# Patient Record
Sex: Male | Born: 1955
Health system: Southern US, Community
[De-identification: ages and names within clinical notes are randomized; demographics above are authoritative.]

## PROBLEM LIST (undated history)

## (undated) DIAGNOSIS — I10 Essential (primary) hypertension: Secondary | ICD-10-CM

## (undated) DIAGNOSIS — D649 Anemia, unspecified: Secondary | ICD-10-CM

## (undated) DIAGNOSIS — Z87442 Personal history of urinary calculi: Secondary | ICD-10-CM

## (undated) DIAGNOSIS — R011 Cardiac murmur, unspecified: Secondary | ICD-10-CM

## (undated) DIAGNOSIS — E785 Hyperlipidemia, unspecified: Secondary | ICD-10-CM

## (undated) DIAGNOSIS — I739 Peripheral vascular disease, unspecified: Secondary | ICD-10-CM

## (undated) DIAGNOSIS — M25562 Pain in left knee: Secondary | ICD-10-CM

## (undated) DIAGNOSIS — K219 Gastro-esophageal reflux disease without esophagitis: Secondary | ICD-10-CM

## (undated) DIAGNOSIS — G4730 Sleep apnea, unspecified: Secondary | ICD-10-CM

## (undated) DIAGNOSIS — M1990 Unspecified osteoarthritis, unspecified site: Secondary | ICD-10-CM

## (undated) DIAGNOSIS — I4891 Unspecified atrial fibrillation: Secondary | ICD-10-CM

## (undated) DIAGNOSIS — E119 Type 2 diabetes mellitus without complications: Secondary | ICD-10-CM

## (undated) DIAGNOSIS — R519 Headache, unspecified: Secondary | ICD-10-CM

## (undated) DIAGNOSIS — I350 Nonrheumatic aortic (valve) stenosis: Secondary | ICD-10-CM

## (undated) DIAGNOSIS — R0600 Dyspnea, unspecified: Secondary | ICD-10-CM

## (undated) DIAGNOSIS — S83209A Unspecified tear of unspecified meniscus, current injury, unspecified knee, initial encounter: Secondary | ICD-10-CM

## (undated) DIAGNOSIS — I2510 Atherosclerotic heart disease of native coronary artery without angina pectoris: Secondary | ICD-10-CM

## (undated) DIAGNOSIS — C801 Malignant (primary) neoplasm, unspecified: Secondary | ICD-10-CM

## (undated) HISTORY — DX: Unspecified atrial fibrillation: I48.91

## (undated) HISTORY — PX: TONSILLECTOMY: SUR1361

## (undated) HISTORY — PX: KNEE ARTHROSCOPY W/ MENISCECTOMY: SHX1879

## (undated) HISTORY — DX: Nonrheumatic aortic (valve) stenosis: I35.0

## (undated) HISTORY — PX: CATARACT EXTRACTION W/ INTRAOCULAR LENS  IMPLANT, BILATERAL: SHX1307

---

## 1980-01-20 HISTORY — PX: NASAL SINUS SURGERY: SHX719

## 1983-01-20 HISTORY — PX: CERVICAL FUSION: SHX112

## 1989-01-19 HISTORY — PX: KNEE ARTHROSCOPY W/ MENISCECTOMY: SHX1879

## 1996-01-20 HISTORY — PX: APPENDECTOMY: SHX54

## 2002-04-11 ENCOUNTER — Encounter: Admission: RE | Admit: 2002-04-11 | Discharge: 2002-07-10 | Payer: Self-pay | Admitting: *Deleted

## 2004-01-04 ENCOUNTER — Encounter: Admission: RE | Admit: 2004-01-04 | Discharge: 2004-01-04 | Payer: Self-pay | Admitting: Family Medicine

## 2005-07-10 ENCOUNTER — Encounter: Admission: RE | Admit: 2005-07-10 | Discharge: 2005-07-10 | Payer: Self-pay | Admitting: Otolaryngology

## 2005-09-04 ENCOUNTER — Ambulatory Visit (HOSPITAL_BASED_OUTPATIENT_CLINIC_OR_DEPARTMENT_OTHER): Admission: RE | Admit: 2005-09-04 | Discharge: 2005-09-04 | Payer: Self-pay | Admitting: Orthopedic Surgery

## 2005-09-04 HISTORY — PX: ROTATOR CUFF REPAIR: SHX139

## 2006-04-28 ENCOUNTER — Encounter: Admission: RE | Admit: 2006-04-28 | Discharge: 2006-04-28 | Payer: Self-pay | Admitting: Family Medicine

## 2010-02-09 ENCOUNTER — Encounter: Payer: Self-pay | Admitting: Otolaryngology

## 2010-02-09 ENCOUNTER — Encounter: Payer: Self-pay | Admitting: Cardiology
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
## 2010-06-20 ENCOUNTER — Ambulatory Visit
Admission: RE | Admit: 2010-06-20 | Discharge: 2010-06-20 | Disposition: A | Discharge status: Home / Self Care | Payer: 59 | Source: Ambulatory Visit | Attending: Family Medicine | Admitting: Family Medicine

## 2010-06-20 ENCOUNTER — Other Ambulatory Visit: Payer: Self-pay | Admitting: Family Medicine

## 2010-06-20 DIAGNOSIS — R109 Unspecified abdominal pain: Secondary | ICD-10-CM

## 2011-10-14 ENCOUNTER — Encounter (HOSPITAL_BASED_OUTPATIENT_CLINIC_OR_DEPARTMENT_OTHER): Payer: Self-pay | Admitting: *Deleted

## 2011-10-15 ENCOUNTER — Encounter (HOSPITAL_BASED_OUTPATIENT_CLINIC_OR_DEPARTMENT_OTHER): Payer: Self-pay | Admitting: *Deleted

## 2011-10-15 NOTE — Progress Notes (Signed)
NPO AFTER MN WITH EXCEPTION WATER GATORADE UNTIL 0700. ARRIVES AT 1100. NEEDS ISTAT AND EKG. WILL TAKE FLEXERIL AND SERZONE AM OF SURG W/ SIP OF WATER.

## 2011-10-19 ENCOUNTER — Encounter (HOSPITAL_BASED_OUTPATIENT_CLINIC_OR_DEPARTMENT_OTHER)
Admission: RE | Disposition: A | Discharge status: Home / Self Care | Payer: Self-pay | Source: Ambulatory Visit | Attending: Specialist

## 2011-10-19 ENCOUNTER — Other Ambulatory Visit: Payer: Self-pay

## 2011-10-19 ENCOUNTER — Encounter (HOSPITAL_BASED_OUTPATIENT_CLINIC_OR_DEPARTMENT_OTHER): Payer: Self-pay | Admitting: *Deleted

## 2011-10-19 ENCOUNTER — Other Ambulatory Visit: Payer: Self-pay | Admitting: Specialist

## 2011-10-19 ENCOUNTER — Ambulatory Visit (HOSPITAL_BASED_OUTPATIENT_CLINIC_OR_DEPARTMENT_OTHER)
Admission: RE | Admit: 2011-10-19 | Discharge: 2011-10-19 | Disposition: A | Discharge status: Home / Self Care | Payer: Worker's Compensation | Source: Ambulatory Visit | Attending: Specialist | Admitting: Specialist

## 2011-10-19 ENCOUNTER — Ambulatory Visit (HOSPITAL_BASED_OUTPATIENT_CLINIC_OR_DEPARTMENT_OTHER): Payer: Worker's Compensation | Admitting: Anesthesiology

## 2011-10-19 ENCOUNTER — Encounter (HOSPITAL_BASED_OUTPATIENT_CLINIC_OR_DEPARTMENT_OTHER): Payer: Self-pay | Admitting: Anesthesiology

## 2011-10-19 DIAGNOSIS — S83249A Other tear of medial meniscus, current injury, unspecified knee, initial encounter: Secondary | ICD-10-CM

## 2011-10-19 DIAGNOSIS — X58XXXA Exposure to other specified factors, initial encounter: Secondary | ICD-10-CM | POA: Insufficient documentation

## 2011-10-19 DIAGNOSIS — E785 Hyperlipidemia, unspecified: Secondary | ICD-10-CM | POA: Insufficient documentation

## 2011-10-19 DIAGNOSIS — I10 Essential (primary) hypertension: Secondary | ICD-10-CM | POA: Insufficient documentation

## 2011-10-19 DIAGNOSIS — IMO0002 Reserved for concepts with insufficient information to code with codable children: Secondary | ICD-10-CM | POA: Insufficient documentation

## 2011-10-19 HISTORY — DX: Unspecified osteoarthritis, unspecified site: M19.90

## 2011-10-19 HISTORY — DX: Cardiac murmur, unspecified: R01.1

## 2011-10-19 HISTORY — DX: Hyperlipidemia, unspecified: E78.5

## 2011-10-19 HISTORY — DX: Pain in left knee: M25.562

## 2011-10-19 HISTORY — DX: Unspecified tear of unspecified meniscus, current injury, unspecified knee, initial encounter: S83.209A

## 2011-10-19 HISTORY — DX: Essential (primary) hypertension: I10

## 2011-10-19 SURGERY — ARTHROSCOPY, KNEE, WITH MEDIAL MENISCECTOMY
Anesthesia: General | Site: Knee | Wound class: Clean

## 2011-10-19 MED ORDER — LACTATED RINGERS IV SOLN: INTRAVENOUS | Status: DC

## 2011-10-19 MED ORDER — CHLORHEXIDINE GLUCONATE 4 % EX LIQD: 60.0000 mL | Freq: Once | CUTANEOUS | Status: DC

## 2011-10-19 MED ORDER — CLINDAMYCIN PHOSPHATE 900 MG/50ML IV SOLN
INTRAVENOUS | Status: DC | PRN
  Administered 2011-10-19: 900 mg via INTRAVENOUS

## 2011-10-19 MED ORDER — ONDANSETRON HCL 4 MG/2ML IJ SOLN
INTRAMUSCULAR | Status: DC | PRN
  Administered 2011-10-19: 4 mg via INTRAVENOUS

## 2011-10-19 MED ORDER — SODIUM CHLORIDE 0.9 % IR SOLN
Status: DC | PRN
  Administered 2011-10-19: 14:00:00

## 2011-10-19 MED ORDER — LIDOCAINE HCL (CARDIAC) 20 MG/ML IV SOLN
INTRAVENOUS | Status: DC | PRN
  Administered 2011-10-19: 100 mg via INTRAVENOUS

## 2011-10-19 MED ORDER — PROPOFOL 10 MG/ML IV BOLUS
INTRAVENOUS | Status: DC | PRN
  Administered 2011-10-19: 300 mg via INTRAVENOUS

## 2011-10-19 MED ORDER — FENTANYL CITRATE 0.05 MG/ML IJ SOLN
INTRAMUSCULAR | Status: DC | PRN
  Administered 2011-10-19: 100 ug via INTRAVENOUS
  Administered 2011-10-19 (×2): 50 ug via INTRAVENOUS

## 2011-10-19 MED ORDER — HYDROCODONE-ACETAMINOPHEN 7.5-325 MG PO TABS: 1.0000 | ORAL_TABLET | ORAL | Status: DC | PRN

## 2011-10-19 MED ORDER — FENTANYL CITRATE 0.05 MG/ML IJ SOLN: 25.0000 ug | INTRAMUSCULAR | Status: DC | PRN

## 2011-10-19 MED ORDER — DEXTROSE 5 % IV SOLN: 900.0000 mg | Freq: Once | INTRAVENOUS | Status: DC

## 2011-10-19 MED ORDER — LACTATED RINGERS IV SOLN
INTRAVENOUS | Status: DC | PRN
  Administered 2011-10-19: 13:00:00 via INTRAVENOUS

## 2011-10-19 MED ORDER — KETOROLAC TROMETHAMINE 30 MG/ML IJ SOLN
INTRAMUSCULAR | Status: DC | PRN
  Administered 2011-10-19: 30 mg via INTRAVENOUS

## 2011-10-19 MED ORDER — LACTATED RINGERS IV SOLN
INTRAVENOUS | Status: DC
  Administered 2011-10-19: 13:00:00 via INTRAVENOUS

## 2011-10-19 MED ORDER — CEFAZOLIN SODIUM-DEXTROSE 2-3 GM-% IV SOLR
2.0000 g | INTRAVENOUS | Status: AC
  Administered 2011-10-19: 2 g via INTRAVENOUS

## 2011-10-19 MED ORDER — DEXAMETHASONE SODIUM PHOSPHATE 4 MG/ML IJ SOLN
INTRAMUSCULAR | Status: DC | PRN
  Administered 2011-10-19: 10 mg via INTRAVENOUS

## 2011-10-19 MED ORDER — CLINDAMYCIN PHOSPHATE 900 MG/50ML IV SOLN: 900.0000 mg | Freq: Once | INTRAVENOUS | Status: DC

## 2011-10-19 MED ORDER — BUPIVACAINE-EPINEPHRINE 0.5% -1:200000 IJ SOLN
INTRAMUSCULAR | Status: DC | PRN
  Administered 2011-10-19: 20 mL

## 2011-10-19 MED ORDER — MIDAZOLAM HCL 5 MG/5ML IJ SOLN
INTRAMUSCULAR | Status: DC | PRN
  Administered 2011-10-19: 2 mg via INTRAVENOUS

## 2011-10-19 SURGICAL SUPPLY — 43 items
BANDAGE ELASTIC 6 VELCRO ST LF (GAUZE/BANDAGES/DRESSINGS) ×3 IMPLANT
BLADE 4.2CUDA (BLADE) IMPLANT
BLADE CUDA SHAVER 3.5 (BLADE) ×3 IMPLANT
BLADE GREAT WHITE 4.2 (BLADE) IMPLANT
BOOTIES KNEE HIGH SLOAN (MISCELLANEOUS) ×3 IMPLANT
CANISTER SUCT LVC 12 LTR MEDI- (MISCELLANEOUS) ×3 IMPLANT
CANISTER SUCTION 1200CC (MISCELLANEOUS) IMPLANT
CANISTER SUCTION 2500CC (MISCELLANEOUS) IMPLANT
CANNULA ACUFLEX KIT 5X76 (CANNULA) IMPLANT
CLOTH BEACON ORANGE TIMEOUT ST (SAFETY) ×3 IMPLANT
CUTTER MENISCUS  4.2MM (BLADE)
CUTTER MENISCUS 4.2MM (BLADE) IMPLANT
DRAPE ARTHROSCOPY W/POUCH 114 (DRAPES) ×3 IMPLANT
DRSG PAD ABDOMINAL 8X10 ST (GAUZE/BANDAGES/DRESSINGS) ×2 IMPLANT
DURAPREP 26ML APPLICATOR (WOUND CARE) ×3 IMPLANT
ELECT REM PT RETURN 9FT ADLT (ELECTROSURGICAL)
ELECTRODE REM PT RTRN 9FT ADLT (ELECTROSURGICAL) IMPLANT
GLOVE BIO SURGEON STRL SZ 6.5 (GLOVE) ×2 IMPLANT
GLOVE ECLIPSE 6.0 STRL STRAW (GLOVE) ×2 IMPLANT
GLOVE SURG SS PI 8.0 STRL IVOR (GLOVE) ×3 IMPLANT
GOWN PREVENTION PLUS LG XLONG (DISPOSABLE) ×3 IMPLANT
GOWN STRL REIN XL XLG (GOWN DISPOSABLE) ×3 IMPLANT
IV NS IRRIG 3000ML ARTHROMATIC (IV SOLUTION) ×6 IMPLANT
KNEE WRAP E Z 3 GEL PACK (MISCELLANEOUS) ×3 IMPLANT
MINI VAC (SURGICAL WAND) IMPLANT
NDL FILTER BLUNT 18X1 1/2 (NEEDLE) ×1 IMPLANT
NDL SAFETY ECLIPSE 18X1.5 (NEEDLE) ×4 IMPLANT
NEEDLE FILTER BLUNT 18X 1/2SAF (NEEDLE) ×1
NEEDLE FILTER BLUNT 18X1 1/2 (NEEDLE) ×2 IMPLANT
NEEDLE HYPO 18GX1.5 SHARP (NEEDLE) ×6
PACK ARTHROSCOPY DSU (CUSTOM PROCEDURE TRAY) ×3 IMPLANT
PACK BASIN DAY SURGERY FS (CUSTOM PROCEDURE TRAY) ×3 IMPLANT
PADDING CAST COTTON 6X4 STRL (CAST SUPPLIES) ×3 IMPLANT
RESECTOR FULL RADIUS 4.2MM (BLADE) IMPLANT
SET ARTHROSCOPY TUBING (MISCELLANEOUS) ×3
SET ARTHROSCOPY TUBING LN (MISCELLANEOUS) ×2 IMPLANT
SPONGE GAUZE 4X4 12PLY (GAUZE/BANDAGES/DRESSINGS) ×3 IMPLANT
SUT ETHILON 4 0 PS 2 18 (SUTURE) ×3 IMPLANT
SYR 30ML LL (SYRINGE) ×3 IMPLANT
SYRINGE 10CC LL (SYRINGE) ×3 IMPLANT
TOWEL OR 17X24 6PK STRL BLUE (TOWEL DISPOSABLE) ×3 IMPLANT
WAND 90 DEG TURBOVAC W/CORD (SURGICAL WAND) IMPLANT
WATER STERILE IRR 500ML POUR (IV SOLUTION) ×3 IMPLANT

## 2011-10-19 NOTE — Brief Op Note (Signed)
10/19/2011  2:04 PM  PATIENT:  Justin [name]  57 y.o. male  PRE-OPERATIVE DIAGNOSIS:  meniscus medial on left  POST-OPERATIVE DIAGNOSIS:  meniscus medial on left  PROCEDURE:  Procedure(s) (LRB) with comments: KNEE ARTHROSCOPY WITH MEDIAL MENISECTOMY () - partial  SURGEON:  Surgeon(s) and Role:    * Javier Docker, MD - Primary  PHYSICIAN ASSISTANT:   ASSISTANTS: none   ANESTHESIA:   general  EBL:     BLOOD ADMINISTERED:none  DRAINS: none   LOCAL MEDICATIONS USED:  MARCAINE     SPECIMEN:  No Specimen  DISPOSITION OF SPECIMEN:  N/A  COUNTS:  YES  TOURNIQUET:  * No tourniquets in log *  DICTATION: .Other Dictation: Dictation Number [id number]  PLAN OF CARE: Discharge to home after PACU  PATIENT DISPOSITION:  PACU - hemodynamically stable.   Delay start of Pharmacological VTE agent (>24hrs) due to surgical blood loss or risk of bleeding: no

## 2011-10-19 NOTE — Anesthesia Procedure Notes (Addendum)
Performed by: Jessica Priest   Procedure Name: LMA Insertion Date/Time: 10/19/2011 1:35 PM Performed by: Jessica Priest Pre-anesthesia Checklist: Patient identified, Emergency Drugs available, Suction available and Patient being monitored Patient Re-evaluated:Patient Re-evaluated prior to inductionOxygen Delivery Method: Circle System Utilized Preoxygenation: Pre-oxygenation with 100% oxygen Intubation Type: IV induction Ventilation: Mask ventilation without difficulty LMA: LMA with gastric port inserted LMA Size: 5.0 Number of attempts: 1 Placement Confirmation: positive ETCO2 Tube secured with: Tape Dental Injury: Teeth and Oropharynx as per pre-operative assessment

## 2011-10-19 NOTE — H&P (Signed)
Justin [name] is an 56 y.o. male.   Chief Complaint: Left knee pain HPI: Medial meniscus tear left knee  Past Medical History  Diagnosis Date  . Acute meniscal tear of knee LEFT  . Hypertension   . Hyperlipidemia   . Arthritis   . Left knee pain   . Heart murmur MILD-- ASYMPTOMATIC    Past Surgical History  Procedure Date  . Rotator cuff repair 09-04-2005    LEFT SHOULDER  . Cervical fusion 1985    C4 - 5  . Knee arthroscopy w/ meniscectomy 1991    LEFT KNEE  . Nasal sinus surgery 1982  . Cataract extraction w/ intraocular lens  implant, bilateral 1998/  2000  . Tonsillectomy AGE 49  . Appendectomy 1998    History reviewed. No pertinent family history. Social History:  reports that he has quit smoking. His smoking use included Cigarettes. He has a 23 pack-year smoking history. He has never used smokeless tobacco. He reports that he drinks alcohol. He reports that he does not use illicit drugs.  Allergies:  Allergies  Allergen Reactions  . Codeine Swelling    No prescriptions prior to admission    No results found for this or any previous visit (from the past 48 hour(s)). No results found.  Review of Systems  Musculoskeletal: Positive for joint pain.  All other systems reviewed and are negative.    Height 5\' 8"  (1.727 m), weight 108.863 kg (240 lb). Physical Exam  Vitals reviewed. Constitutional: He is oriented to person, place, and time. He appears well-developed.  HENT:  Head: Normocephalic.  Eyes: Pupils are equal, round, and reactive to light.  Neck: Normal range of motion.  Cardiovascular: Normal rate.   Respiratory: Effort normal.  GI: Soft.  Musculoskeletal: He exhibits edema and tenderness.       MJLT left. +MCMurray. +effusion.  Neurological: He is alert and oriented to person, place, and time.  Skin: Skin is warm and dry.  Psychiatric: He has a normal mood and affect.     Assessment/Plan Medial meniscus tear left knee. Plan arthroscopy. Risks  benefits discussed.  Talmadge Ganas C 10/19/2011, 7:39 AM

## 2011-10-19 NOTE — Anesthesia Preprocedure Evaluation (Signed)
Anesthesia Evaluation  Patient identified by MRN, date of birth, ID band Patient awake    Reviewed: Allergy & Precautions, H&P , NPO status , Patient's Chart, lab work & pertinent test results, reviewed documented beta blocker date and time   Airway Mallampati: III TM Distance: >3 FB Neck ROM: full    Dental  (+) Missing, Loose and Dental Advisory Given,    Pulmonary neg pulmonary ROS,  breath sounds clear to auscultation  Pulmonary exam normal       Cardiovascular hypertension, Pt. on home beta blockers Rhythm:regular Rate:Normal     Neuro/Psych Cervical fusion negative neurological ROS  negative psych ROS   GI/Hepatic negative GI ROS, Neg liver ROS,   Endo/Other  negative endocrine ROS  Renal/GU negative Renal ROS  negative genitourinary   Musculoskeletal   Abdominal   Peds  Hematology negative hematology ROS (+)   Anesthesia Other Findings   Reproductive/Obstetrics negative OB ROS                           Anesthesia Physical Anesthesia Plan  ASA: II  Anesthesia Plan: General   Post-op Pain Management:    Induction: Intravenous  Airway Management Planned: Oral ETT  Additional Equipment:   Intra-op Plan:   Post-operative Plan: Extubation in OR  Informed Consent: I have reviewed the patients History and Physical, chart, labs and discussed the procedure including the risks, benefits and alternatives for the proposed anesthesia with the patient or authorized representative who has indicated his/her understanding and acceptance.   Dental Advisory Given  Plan Discussed with: CRNA and Surgeon  Anesthesia Plan Comments:         Anesthesia Quick Evaluation

## 2011-10-19 NOTE — Transfer of Care (Signed)
Immediate Anesthesia Transfer of Care Note  Patient: Justin [name]  Procedure(s) Performed: Procedure(s) (LRB): KNEE ARTHROSCOPY WITH MEDIAL MENISECTOMY ()  Patient Location: PACU  Anesthesia Type: General  Level of Consciousness: awake, sedated, patient cooperative and responds to stimulation  Airway & Oxygen Therapy: Patient Spontanous Breathing and Patient connected to face mask oxygen  Post-op Assessment: Report given to PACU RN, Post -op Vital signs reviewed and stable and Patient moving all extremities  Post vital signs: Reviewed and stable  Complications: No apparent anesthesia complications

## 2011-10-19 NOTE — Anesthesia Postprocedure Evaluation (Signed)
Anesthesia Post Note  Patient: Justin [name]  Procedure(s) Performed: Procedure(s) (LRB): KNEE ARTHROSCOPY WITH MEDIAL MENISECTOMY ()  Anesthesia type: General  Patient location: PACU  Post pain: Pain level controlled  Post assessment: Post-op Vital signs reviewed  Last Vitals: BP 122/86  Pulse 59  Temp 36.2 C (Oral)  Resp 13  Ht 5\' 8"  (1.727 m)  Wt 242 lb (109.77 kg)  BMI 36.80 kg/m2  SpO2 95%  Post vital signs: Reviewed  Level of consciousness: sedated  Complications: No apparent anesthesia complications

## 2011-10-20 NOTE — Op Note (Signed)
NAMELES, LONGMORE                  ACCOUNT NO.:  [id number]  MEDICAL RECORD NO.:  [id number]  LOCATION:                               FACILITY:  [facility]  PHYSICIAN:  Jene Every, M.D.    DATE OF BIRTH:  27-May-1955  DATE OF PROCEDURE:  10/19/2011 DATE OF DISCHARGE:  10/19/2011                              OPERATIVE REPORT   PREOPERATIVE DIAGNOSIS:  Medial meniscus tear, left knee.  POSTOPERATIVE DIAGNOSIS:  Medial meniscus tear, left knee, grade 3 chondromalacia, patella.  PROCEDURE PERFORMED: 1. Left knee arthroscopy. 2. Partial medial meniscectomy. 3. Chondroplasty, patella.  HISTORY:  A 56, work injury, meniscus tear, confirmed MRI, McMurray, tender medial joint line indicated for partial meniscectomy.  Risk and benefits discussed including bleeding, infection, DVT, PE, anesthetic complications, etc.  TECHNIQUE:  With the patient in supine position, after induction of adequate general anesthesia, 2 g Kefzol and 900 of clindamycin due to history of previous MRSA exposure.  Left lower extremity was prepped and draped in usual sterile fashion.  A lateral parapatellar portal was fashioned with a #11 blade.  Ingress cannula atraumatically placed. Irrigant was utilized to insufflate the joint.  Under direct visualization, a medial parapatellar portal was fashioned with a #11 blade after localization with 18-gauge needle sparing the medial meniscus.  Noted was a radial and complex tear of the posterior third of the medial meniscus.  I introduced a basket rongeur through a medial and lateral portal and resected to a stable base, approximately 1/2. Posterior third was excised.  Further contoured that with an Arthur wand with cautery.  The remnant was stable to probe palpation.  The chondral surfaces were relatively unremarkable.  ACL was unremarkable.  Lateral compartment revealed normal femoral condyle, tibial plateau, and meniscus stable to probe palpation without evidence of  tearing.  Suprapatellar pouch with minor grade 3 changes of patella.  Light chondroplasty performed here.  Normal patellofemoral tracking.  Gutters unremarkable.  Revisited all compartments, no further pathology, amenable to arthroscopic intervention.  I therefore removed all instrumentation. Portals were closed with 4-0 nylon simple sutures.  A 0.25% Marcaine with epinephrine was infiltrated in the joint.  Wound was dressed sterilely.  Awoken without difficulty and transported to the recovery room in satisfactory condition.  The patient tolerated the procedure well.  No complications.  Minimal blood loss.     Jene Every, M.D.     Cordelia Pen  D:  10/19/2011  T:  10/20/2011  Job:  [id number]

## 2011-10-20 NOTE — Op Note (Deleted)
NAME:  Justin [name], Justin [name]                  ACCOUNT NO.:  [id number]  MEDICAL RECORD NO.:  [id number]  LOCATION:                               FACILITY:  [facility]  PHYSICIAN:  Erian Lariviere, M.D.    DATE OF BIRTH:  11/06/1955  DATE OF PROCEDURE:  10/19/2011 DATE OF DISCHARGE:  10/19/2011                              OPERATIVE REPORT   PREOPERATIVE DIAGNOSIS:  Medial meniscus tear, left knee.  POSTOPERATIVE DIAGNOSIS:  Medial meniscus tear, left knee, grade 3 chondromalacia, patella.  PROCEDURE PERFORMED: 1. Left knee arthroscopy. 2. Partial medial meniscectomy. 3. Chondroplasty, patella.  HISTORY:  A 56, work injury, meniscus tear, confirmed MRI, McMurray, tender medial joint line indicated for partial meniscectomy.  Risk and benefits discussed including bleeding, infection, DVT, PE, anesthetic complications, etc.  TECHNIQUE:  With the patient in supine position, after induction of adequate general anesthesia, 2 g Kefzol and 900 of clindamycin due to history of previous MRSA exposure.  Left lower extremity was prepped and draped in usual sterile fashion.  A lateral parapatellar portal was fashioned with a #11 blade.  Ingress cannula atraumatically placed. Irrigant was utilized to insufflate the joint.  Under direct visualization, a medial parapatellar portal was fashioned with a #11 blade after localization with 18-gauge needle sparing the medial meniscus.  Noted was a radial and complex tear of the posterior third of the medial meniscus.  I introduced a basket rongeur through a medial and lateral portal and resected to a stable base, approximately 1/2. Posterior third was excised.  Further contoured that with an Arthur wand with cautery.  The remnant was stable to probe palpation.  The chondral surfaces were relatively unremarkable.  ACL was unremarkable.  Lateral compartment revealed normal femoral condyle, tibial plateau, and meniscus stable to probe palpation without evidence of  tearing.  Suprapatellar pouch with minor grade 3 changes of patella.  Light chondroplasty performed here.  Normal patellofemoral tracking.  Gutters unremarkable.  Revisited all compartments, no further pathology, amenable to arthroscopic intervention.  I therefore removed all instrumentation. Portals were closed with 4-0 nylon simple sutures.  A 0.25% Marcaine with epinephrine was infiltrated in the joint.  Wound was dressed sterilely.  Awoken without difficulty and transported to the recovery room in satisfactory condition.  The patient tolerated the procedure well.  No complications.  Minimal blood loss.     Odyn Turko, M.D.     JB/MEDQ  D:  10/19/2011  T:  10/20/2011  Job:  [id number] 

## 2013-09-18 ENCOUNTER — Ambulatory Visit
Admission: RE | Admit: 2013-09-18 | Discharge: 2013-09-18 | Disposition: A | Discharge status: Home / Self Care | Payer: 59 | Source: Ambulatory Visit | Attending: Family Medicine | Admitting: Family Medicine

## 2013-09-18 ENCOUNTER — Other Ambulatory Visit: Payer: Self-pay | Admitting: Family Medicine

## 2013-09-18 DIAGNOSIS — R05 Cough: Secondary | ICD-10-CM

## 2013-09-18 DIAGNOSIS — R059 Cough, unspecified: Secondary | ICD-10-CM

## 2013-09-27 ENCOUNTER — Ambulatory Visit (INDEPENDENT_AMBULATORY_CARE_PROVIDER_SITE_OTHER)
Admission: RE | Admit: 2013-09-27 | Discharge: 2013-09-27 | Disposition: A | Discharge status: Home / Self Care | Payer: 59 | Source: Ambulatory Visit | Attending: Internal Medicine | Admitting: Internal Medicine

## 2013-09-27 ENCOUNTER — Encounter: Payer: Self-pay | Admitting: Internal Medicine

## 2013-09-27 ENCOUNTER — Ambulatory Visit (INDEPENDENT_AMBULATORY_CARE_PROVIDER_SITE_OTHER): Payer: 59 | Admitting: Internal Medicine

## 2013-09-27 ENCOUNTER — Other Ambulatory Visit (INDEPENDENT_AMBULATORY_CARE_PROVIDER_SITE_OTHER): Payer: 59

## 2013-09-27 VITALS — BP 94/60 | HR 70 | Temp 98.0°F | Ht 68.0 in | Wt 232.0 lb

## 2013-09-27 DIAGNOSIS — Z23 Encounter for immunization: Secondary | ICD-10-CM

## 2013-09-27 DIAGNOSIS — R05 Cough: Secondary | ICD-10-CM

## 2013-09-27 DIAGNOSIS — R918 Other nonspecific abnormal finding of lung field: Secondary | ICD-10-CM

## 2013-09-27 DIAGNOSIS — R059 Cough, unspecified: Secondary | ICD-10-CM

## 2013-09-27 LAB — CBC WITH DIFFERENTIAL/PLATELET
BASOS PCT: 0.3 % (ref 0.0–3.0)
Basophils Absolute: 0 10*3/uL (ref 0.0–0.1)
EOS ABS: 0.1 10*3/uL (ref 0.0–0.7)
Eosinophils Relative: 1.3 % (ref 0.0–5.0)
HCT: 34.2 % — ABNORMAL LOW (ref 39.0–52.0)
Hemoglobin: 11.7 g/dL — ABNORMAL LOW (ref 13.0–17.0)
LYMPHS PCT: 25.3 % (ref 12.0–46.0)
Lymphs Abs: 1.6 10*3/uL (ref 0.7–4.0)
MCHC: 34.2 g/dL (ref 30.0–36.0)
MCV: 92.9 fl (ref 78.0–100.0)
MONOS PCT: 9.1 % (ref 3.0–12.0)
Monocytes Absolute: 0.6 10*3/uL (ref 0.1–1.0)
NEUTROS PCT: 64 % (ref 43.0–77.0)
Neutro Abs: 4.1 10*3/uL (ref 1.4–7.7)
Platelets: 198 10*3/uL (ref 150.0–400.0)
RBC: 3.68 Mil/uL — AB (ref 4.22–5.81)
RDW: 18.3 % — ABNORMAL HIGH (ref 11.5–15.5)
WBC: 6.4 10*3/uL (ref 4.0–10.5)

## 2013-09-27 LAB — SEDIMENTATION RATE: Sed Rate: 104 mm/hr — ABNORMAL HIGH (ref 0–22)

## 2013-09-27 NOTE — Assessment & Plan Note (Addendum)
ACE inhibitors are problematic in  pts with airway complaints because  even experienced pulmonologists can't always distinguish ace effects from copd/asthma.  By themselves they don't actually cause a problem, much like oxygen can't by itself start a fire, but they certainly serve as a powerful catalyst or enhancer for any "fire"  or inflammatory process in the upper airway, be it caused by an ET  tube or more commonly reflux (especially in the obese or pts with known GERD or who are on biphoshonates).    In the era of ARB near equivalency until we have a better handle on the reversibility of the airway problem, it just makes sense to avoid ACEI  entirely in the short run  - since not using it for hbp ok to try off for now.

## 2013-09-27 NOTE — Progress Notes (Signed)
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 Pulmonary Consult    Referred by Dr. Eloise Levels. Pt states that he was dxed with PNA approx 2 wks ago. He cough cough and fever x 3 wks.  Cough is prod with minimal green to brown sputum.   no longer having bad rigors but still daily fever around 100, no saturating sweats, sputum vol down and color less dark completing levaquin x 2 more days due. No overt sinus complaints  No obvious other patterns in day to day or daytime variabilty or assoc chronic cough or cp or chest tightness, subjective wheeze overt sinus or hb symptoms. No unusual exp hx or h/o childhood pna/ asthma or knowledge of premature birth.  Sleeping ok without nocturnal  or early am exacerbation  of respiratory  c/o's or need for noct saba. Also denies any obvious fluctuation of symptoms with weather or environmental changes or other aggravating or alleviating factors except as outlined above   Current Medications, Allergies, Complete Past Medical History, Past Surgical History, Family History, and Social History were reviewed in [employer] record.              Review of Systems  Constitutional: Positive for appetite change. Negative for fever, chills, activity change and unexpected weight change.  HENT: Negative for congestion, dental problem, postnasal drip, rhinorrhea, sneezing, sore throat, trouble swallowing  and voice change.   Eyes: Negative for visual disturbance.  Respiratory: Positive for cough. Negative for choking and shortness of breath.   Cardiovascular: Negative for chest pain and leg swelling.  Gastrointestinal: Negative for nausea, vomiting and abdominal pain.  Genitourinary: Negative for difficulty urinating.  Musculoskeletal: Negative for arthralgias.  Skin: Negative for rash.  Psychiatric/Behavioral: Negative for behavioral problems and confusion.       Objective:   Physical Exam  amb wm nad - does not appear ill at all   Wt Readings from Last 3 Encounters:  09/27/13 232 lb (105.235 kg)  10/19/11 242 lb (109.77 kg)  10/19/11 242 lb (109.77 kg)     HEENT: nl dentition, turbinates, and orophanx. Nl external ear canals without cough reflex   NECK :  without JVD/Nodes/TM/ nl carotid upstrokes bilaterally   LUNGS: no acc muscle use, clear to A and P bilaterally without cough on insp or exp maneuvers   CV:  RRR  no s3 or murmur or increase in P2, no edema   ABD:  soft and nontender with nl excursion in the supine position. No bruits or organomegaly, bowel sounds nl  MS:  warm without deformities, calf tenderness, cyanosis or clubbing  SKIN: warm and dry without lesions    NEURO:  alert, approp, no deficits    CXR  09/27/2013 :  There is mild atelectasis in the lung bases. Lungs otherwise clear. Heart size normal. No pneumothorax or pleural effusion.    Lab Results  Component Value  Date   ESRSEDRATE 104* 09/27/2013     Lab Results  Component Value Date   WBC 6.4 09/27/2013   HGB 11.7* 09/27/2013   HCT 34.2* 09/27/2013   MCV 92.9 09/27/2013   PLT 198.0 09/27/2013        Assessment & Plan:

## 2013-09-27 NOTE — Assessment & Plan Note (Signed)
Acute febrile illness with ? Lingular infiltrates resolved but still bad cough and some subjective fever / high esr c/w resolving bronchopna but could also have some form of bronhchiolitis or low grade BOOP not apparent by plain cxr   rec 6 days of prednisone only and f/u in 2 weeks/ no further abx

## 2013-09-27 NOTE — Patient Instructions (Addendum)
Stop lisinopril  Finish levaquin   Best cough medicine mucinex dm 1200 mg every 12 hours as needed   Please remember to go to the lab and x-ray department downstairs for your tests - we will call you with the results when they are available.  The key is to stop smoking completely before smoking completely stops you!     Please schedule a follow up office visit in 4 weeks, sooner if needed  rec ESR 104 so rx Prednisone 10 mg take  4 each am x 2 days,   2 each am x 2 days,  1 each am x 2 days and stop and f/u in 2 weeks

## 2013-09-28 ENCOUNTER — Other Ambulatory Visit: Payer: Self-pay | Admitting: Internal Medicine

## 2013-09-28 ENCOUNTER — Encounter: Payer: Self-pay | Admitting: *Deleted

## 2013-09-28 MED ORDER — PREDNISONE 10 MG PO TABS: ORAL_TABLET | ORAL | Status: DC

## 2013-09-28 NOTE — Progress Notes (Signed)
Quick Note:  Spoke with pt and notified of results per Dr. Wert. Pt verbalized understanding and denied any questions.  ______ 

## 2013-10-12 ENCOUNTER — Ambulatory Visit: Payer: Self-pay | Admitting: Internal Medicine

## 2013-10-18 ENCOUNTER — Ambulatory Visit: Payer: Self-pay | Admitting: Internal Medicine

## 2013-10-19 ENCOUNTER — Encounter: Payer: Self-pay | Admitting: Internal Medicine

## 2013-10-19 ENCOUNTER — Ambulatory Visit (INDEPENDENT_AMBULATORY_CARE_PROVIDER_SITE_OTHER): Payer: Managed Care, Other (non HMO) | Admitting: Internal Medicine

## 2013-10-19 VITALS — BP 100/80 | HR 73 | Temp 98.2°F | Ht 68.0 in | Wt 230.0 lb

## 2013-10-19 DIAGNOSIS — I10 Essential (primary) hypertension: Secondary | ICD-10-CM

## 2013-10-19 DIAGNOSIS — R059 Cough, unspecified: Secondary | ICD-10-CM

## 2013-10-19 DIAGNOSIS — R918 Other nonspecific abnormal finding of lung field: Secondary | ICD-10-CM

## 2013-10-19 DIAGNOSIS — R05 Cough: Secondary | ICD-10-CM

## 2013-10-19 NOTE — Progress Notes (Signed)
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 Pulmonary Consult    Referred by Dr. Eloise Levels. Pt states that he was dxed with PNA approx 2 wks ago. He cough cough and fever x 3 wks.  Cough is prod with minimal green to brown sputum.   no longer having bad rigors but still daily fever around 100, no saturating sweats, sputum vol down and color less dark completing levaquin x 2 more days due. No overt sinus complaints rec Stop lisinopril Finish levaquin  Best cough medicine mucinex dm 1200 mg every 12 hours as needed  Please remember to go to the lab and x-ray department downstairs for your tests - we will call you with the results when they are available. The key is to stop smoking completely before smoking completely stops you!    Please schedule a follow up office visit in 4 weeks, sooner if needed  rec ESR 104 so rx Prednisone 10 mg take  4 each am x 2 days,   2 each am x 2 days,  1 each am x 2 days and stop and f/u in 2 weeks    10/19/2013 f/u ov/Jayana Kotula re: ? acei cough post uri/ still smoking  Chief Complaint  Patient presents with  . Follow-up    Pt reports cough is much improved. No new co's today.      Not limited by breathing from desired activities    No obvious day to day or daytime variabilty or assoc chronic cough or cp or chest tightness, subjective wheeze overt sinus or hb symptoms.  No unusual exp hx or h/o childhood pna/ asthma or knowledge of premature birth.  Sleeping ok without nocturnal  or early am exacerbation  of respiratory  c/o's or need for noct saba. Also denies any obvious fluctuation of symptoms with weather or environmental changes or other aggravating or alleviating factors except as outlined above   Current Medications, Allergies, Complete Past Medical History, Past Surgical History, Family History, and Social History were reviewed in [employer] record.  ROS  The following are not active complaints unless bolded sore throat, dysphagia, dental problems, itching, sneezing,  nasal congestion or excess/ purulent secretions, ear ache,   fever, chills, sweats, unintended wt loss, pleuritic or exertional cp, hemoptysis,  orthopnea pnd or leg swelling, presyncope, palpitations, heartburn, abdominal pain, anorexia, nausea, vomiting, diarrhea  or change in bowel or urinary habits, change in stools or urine, dysuria,hematuria,  rash, arthralgias, visual complaints, headache, numbness weakness or ataxia or problems with walking or coordination,  change in mood/affect or memory.                        Objective:   Physical Exam  amb wm nad - does not appear ill at all    .10/19/2013  230  Wt Readings from Last 3 Encounters:  09/27/13 232 lb (105.235 kg)  10/19/11 242 lb (109.77 kg)  10/19/11 242 lb (109.77 kg)     HEENT: nl dentition, turbinates, and orophanx. Nl external ear canals without cough reflex   NECK :  without JVD/Nodes/TM/ nl carotid upstrokes bilaterally   LUNGS: no acc muscle use, clear to A and P bilaterally without cough on insp or exp maneuvers   CV:  RRR  no s3 or murmur or increase in P2, no edema   ABD:  soft and nontender with nl excursion in the supine position. No bruits or organomegaly, bowel sounds nl  MS:  warm without deformities, calf tenderness, cyanosis or clubbing  SKIN: warm and dry  without lesions    NEURO:  alert, approp, no deficits    CXR  09/27/2013 :  There is mild atelectasis in the lung bases. Lungs otherwise clear. Heart size normal. No pneumothorax or pleural effusion.    Lab Results  Component Value Date   ESRSEDRATE 104* 09/27/2013     Lab Results  Component Value Date   WBC 6.4 09/27/2013   HGB 11.7* 09/27/2013   HCT 34.2* 09/27/2013   MCV 92.9 09/27/2013   PLT 198.0 09/27/2013        Assessment & Plan:

## 2013-10-19 NOTE — Assessment & Plan Note (Signed)
Resolved s further abx/ off prednisone s flare of symptoms so no need for further studies at this point   See instructions for specific recommendations which were reviewed directly with the patient who was given a copy with highlighter outlining the key components.

## 2013-10-19 NOTE — Assessment & Plan Note (Signed)
Trial off acei 09/27/13 for cough > resolved  10/19/2013 and bp ok on just atenolol > avoid acei here

## 2013-10-19 NOTE — Patient Instructions (Signed)
The key is to stop smoking completely before smoking completely stops you!   Pulmonary follow up is as needed for cough or shortness of breath

## 2013-10-19 NOTE — Assessment & Plan Note (Signed)
Resolved completely back to baseline off acei > avoid indefinitely

## 2013-10-25 ENCOUNTER — Ambulatory Visit: Payer: Self-pay | Admitting: Internal Medicine

## 2013-10-27 ENCOUNTER — Encounter: Payer: Self-pay | Admitting: *Deleted

## 2014-02-19 ENCOUNTER — Other Ambulatory Visit (HOSPITAL_COMMUNITY): Payer: Self-pay

## 2014-02-26 ENCOUNTER — Ambulatory Visit: Payer: Self-pay | Admitting: Dietician

## 2014-03-20 ENCOUNTER — Encounter: Payer: Self-pay | Admitting: *Deleted

## 2014-03-20 ENCOUNTER — Encounter: Payer: BLUE CROSS/BLUE SHIELD | Attending: Family Medicine | Admitting: *Deleted

## 2014-03-20 DIAGNOSIS — R739 Hyperglycemia, unspecified: Secondary | ICD-10-CM | POA: Insufficient documentation

## 2014-03-20 DIAGNOSIS — Z713 Dietary counseling and surveillance: Secondary | ICD-10-CM | POA: Diagnosis not present

## 2014-03-20 NOTE — Progress Notes (Signed)
  Medical Nutrition Therapy:  Appt start time: 1000 end time:  1100.   Assessment:  Primary concerns today: Justin [name] is here for diabetes self-management education.  He had lab work done recently and his glucose was 132 mg/dl.  He did not have a HbA1c done.  His wife is also a patient in our center as she has type 2 diabetes herself.  Rahil works as an Occupational psychologist.  His eating, sleeping schedule is not consistent and it's difficult to have a steady meal plan. He also has 3 herniated discs in his back and he has trouble with exercise. His weight fluctuates 10-15 pounds.  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  They do not offer specialized meals for employees (so he couldn't ask for a diabetic meal).  When he flies he eats 1-2 times in 24 hours.  He might eat when he gets home, but he might go straight to bed. This month he flies 6 times, 3 days at a time.  He does consume a lot of sugary beverages and drinks a fair amount of alcohol.  Preferred Learning Style:  No preference indicated   Learning Readiness:   Contemplating- not sure if he can make changes given his schedule and physical limitations   MEDICATIONS: see list   DIETARY INTAKE:  Usual eating pattern includes 1-2 meals and 0 snacks per day.  Everyday foods include starches, proteins.  Avoided foods include none.     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  No activity when not in flight though  Estimated energy needs: 1800-2000 calories 200 g carbohydrates 135 g protein 50 g fat  Progress Towards Goal(s):  In progress.   Nutritional Diagnosis:  NB-1.7 Undesireable food choices As related to employement as flight attendant and having to  eat whatever is available.  As evidenced by dietary assessment.    Intervention:  Nutrition counseling provided.  Given his many limitations, did not provide standardized diabetes self-management education.  Made suggestions/recommendations that would work with his lifestyle to make moderate improvements in his health:  Talk to wife about limiting sweets in the home Try to limit alcohol to 2 glasses/24 hours Try to limit sugar in coffee to 1-2 spoonfuls instead of 3 Try to limit sodas to 1/24 hours Increase water Eat protein with your carbs (keep nuts or cheese or peanut butter with you when flying) Try to get the salads when you can Try to eat 2-4 times in 24 hours Try 2% milk Consider [employer] membership for water exercises  Teaching Method Utilized:  Auditory   Barriers to learning/adherence to lifestyle change: schedule/back pain/fatigue after long flights  Demonstrated degree of understanding via:  Teach Back   Monitoring/Evaluation:  Dietary intake, exercise, labs, and body weight prn.

## 2014-03-20 NOTE — Patient Instructions (Signed)
Talk to wife about limiting sweets in the home Try to limit alcohol to 2 glasses/24 hours Try to limit sugar in coffee to 1-2 spoonfuls instead of 3 Try to limit sodas to 1/24 hours Increase water Eat protein with your carbs (keep nuts or cheese or peanut butter with you when flying) Try to get the salads when you can Try to eat 2-4 times in 24 hours Try 2% milk Consider [employer] membership for water exercises

## 2014-06-05 ENCOUNTER — Other Ambulatory Visit (HOSPITAL_COMMUNITY): Payer: Self-pay | Admitting: Family Medicine

## 2014-06-05 ENCOUNTER — Other Ambulatory Visit: Payer: Self-pay | Admitting: Family Medicine

## 2014-06-05 DIAGNOSIS — R011 Cardiac murmur, unspecified: Secondary | ICD-10-CM

## 2014-06-06 ENCOUNTER — Other Ambulatory Visit (HOSPITAL_COMMUNITY): Payer: Self-pay

## 2014-06-08 ENCOUNTER — Telehealth: Payer: Self-pay

## 2014-06-08 NOTE — Telephone Encounter (Signed)
I SPOKE WITH STACY  FROM  DR Sheryn Bison OFFICE DUE TO SHORT STAFFING THERE SHE HAS NOT   BEEN ABLE TO CHECK ON PRECERT ,SHE WILL TRY TO WORK IT IN IF SHE IS UNABLE TO SHE WILL [facility] PATIENT APPT . SHE STATED THAT SHE WILL CALL BACK BEFORE 5 TO LET ME KNOW THE OUTCOME

## 2014-06-11 ENCOUNTER — Other Ambulatory Visit: Payer: Self-pay

## 2014-06-11 ENCOUNTER — Ambulatory Visit (HOSPITAL_COMMUNITY): Payer: BLUE CROSS/BLUE SHIELD | Attending: Cardiovascular Disease

## 2014-06-11 ENCOUNTER — Other Ambulatory Visit: Payer: Self-pay | Admitting: Family Medicine

## 2014-06-11 DIAGNOSIS — R011 Cardiac murmur, unspecified: Secondary | ICD-10-CM

## 2014-06-11 DIAGNOSIS — I350 Nonrheumatic aortic (valve) stenosis: Secondary | ICD-10-CM | POA: Insufficient documentation

## 2014-06-11 DIAGNOSIS — I059 Rheumatic mitral valve disease, unspecified: Secondary | ICD-10-CM | POA: Insufficient documentation

## 2014-06-11 DIAGNOSIS — I351 Nonrheumatic aortic (valve) insufficiency: Secondary | ICD-10-CM | POA: Diagnosis not present

## 2014-07-31 ENCOUNTER — Telehealth: Payer: Self-pay | Admitting: Cardiovascular Disease

## 2014-07-31 NOTE — Telephone Encounter (Signed)
Left message to call back  

## 2014-07-31 NOTE — Telephone Encounter (Signed)
Stanton Kidney was calling in to see if the results from the pt's Echo that was done on 5/23 had been reviewed and discussed with Dr. Sheryn Bison. Please f/u with [facility]  Thanks

## 2014-08-01 NOTE — Telephone Encounter (Signed)
Late entry  Spoke to [place] wanted to know if there was comparison of echo read on 05/2014 with echo done 09/2010 by the reading physician. For Dr Sheryn Bison oo review.   RN informed Stanton Kidney , unable to tell by report if that was completed or not.   Would be glad to send to Dr Sallyanne Kuster to respond ,but he will be out of the office for 2 weeks or have another doctor to review. Stanton Kidney states she will send the information to Dr Sheryn Bison and his nurse to see information is needed prior to Dr Sallyanne Kuster return

## 2014-08-20 NOTE — Telephone Encounter (Signed)
Echos were done on two different archiving systems and images cannot be compared directly. Report comparison shows very slow progression of the aortic stenosis, but it remains mild (peak gradient has increased from 22 to 35, mean gradient from 17 to 22, valve area decreased from 1.6 to 1.5 cm sq.). Would send answer directly to Dr. Sheryn Bison, but could not find in [facility]

## 2014-08-21 NOTE — Telephone Encounter (Signed)
Left message to call back--- information obtain about echo report

## 2014-08-23 ENCOUNTER — Telehealth: Payer: Self-pay | Admitting: Cardiology

## 2014-08-23 NOTE — Telephone Encounter (Signed)
Justin [name] is returning Sharon's call in reference to a report that was suppose to be sent over. Please f/u with her   Thanks

## 2014-08-24 NOTE — Telephone Encounter (Signed)
SPOKE TO  Justin [name] ROUTED INFORMATION TO [place].

## 2014-08-24 NOTE — Telephone Encounter (Signed)
Spoke to [place]   routed [phone])  information to her for Dr Sheryn Bison

## 2015-06-04 DIAGNOSIS — E669 Obesity, unspecified: Secondary | ICD-10-CM | POA: Diagnosis not present

## 2015-06-04 DIAGNOSIS — E785 Hyperlipidemia, unspecified: Secondary | ICD-10-CM | POA: Diagnosis not present

## 2015-06-04 DIAGNOSIS — I10 Essential (primary) hypertension: Secondary | ICD-10-CM | POA: Diagnosis not present

## 2015-06-04 DIAGNOSIS — M545 Low back pain: Secondary | ICD-10-CM | POA: Diagnosis not present

## 2015-06-04 DIAGNOSIS — Z79899 Other long term (current) drug therapy: Secondary | ICD-10-CM | POA: Diagnosis not present

## 2015-06-04 DIAGNOSIS — E119 Type 2 diabetes mellitus without complications: Secondary | ICD-10-CM | POA: Diagnosis not present

## 2015-08-29 DIAGNOSIS — M9905 Segmental and somatic dysfunction of pelvic region: Secondary | ICD-10-CM | POA: Diagnosis not present

## 2015-08-29 DIAGNOSIS — M9904 Segmental and somatic dysfunction of sacral region: Secondary | ICD-10-CM | POA: Diagnosis not present

## 2015-08-29 DIAGNOSIS — M9903 Segmental and somatic dysfunction of lumbar region: Secondary | ICD-10-CM | POA: Diagnosis not present

## 2015-08-29 DIAGNOSIS — M5137 Other intervertebral disc degeneration, lumbosacral region: Secondary | ICD-10-CM | POA: Diagnosis not present

## 2015-10-01 DIAGNOSIS — Z23 Encounter for immunization: Secondary | ICD-10-CM | POA: Diagnosis not present

## 2015-10-01 DIAGNOSIS — E119 Type 2 diabetes mellitus without complications: Secondary | ICD-10-CM | POA: Diagnosis not present

## 2015-10-01 DIAGNOSIS — M545 Low back pain: Secondary | ICD-10-CM | POA: Diagnosis not present

## 2015-10-01 DIAGNOSIS — I10 Essential (primary) hypertension: Secondary | ICD-10-CM | POA: Diagnosis not present

## 2015-10-01 DIAGNOSIS — E782 Mixed hyperlipidemia: Secondary | ICD-10-CM | POA: Diagnosis not present

## 2015-11-07 DIAGNOSIS — M5137 Other intervertebral disc degeneration, lumbosacral region: Secondary | ICD-10-CM | POA: Diagnosis not present

## 2015-11-07 DIAGNOSIS — M9905 Segmental and somatic dysfunction of pelvic region: Secondary | ICD-10-CM | POA: Diagnosis not present

## 2015-11-07 DIAGNOSIS — M9903 Segmental and somatic dysfunction of lumbar region: Secondary | ICD-10-CM | POA: Diagnosis not present

## 2015-11-07 DIAGNOSIS — M9904 Segmental and somatic dysfunction of sacral region: Secondary | ICD-10-CM | POA: Diagnosis not present

## 2015-12-24 DIAGNOSIS — Z86010 Personal history of colonic polyps: Secondary | ICD-10-CM | POA: Diagnosis not present

## 2015-12-24 DIAGNOSIS — D120 Benign neoplasm of cecum: Secondary | ICD-10-CM | POA: Diagnosis not present

## 2015-12-24 DIAGNOSIS — D126 Benign neoplasm of colon, unspecified: Secondary | ICD-10-CM | POA: Diagnosis not present

## 2015-12-27 DIAGNOSIS — D126 Benign neoplasm of colon, unspecified: Secondary | ICD-10-CM | POA: Diagnosis not present

## 2016-03-29 DIAGNOSIS — J40 Bronchitis, not specified as acute or chronic: Secondary | ICD-10-CM | POA: Diagnosis not present

## 2016-04-03 DIAGNOSIS — J209 Acute bronchitis, unspecified: Secondary | ICD-10-CM | POA: Diagnosis not present

## 2016-04-03 DIAGNOSIS — I10 Essential (primary) hypertension: Secondary | ICD-10-CM | POA: Diagnosis not present

## 2016-04-03 DIAGNOSIS — E782 Mixed hyperlipidemia: Secondary | ICD-10-CM | POA: Diagnosis not present

## 2016-04-03 DIAGNOSIS — E119 Type 2 diabetes mellitus without complications: Secondary | ICD-10-CM | POA: Diagnosis not present

## 2016-07-28 DIAGNOSIS — I10 Essential (primary) hypertension: Secondary | ICD-10-CM | POA: Diagnosis not present

## 2016-07-28 DIAGNOSIS — E119 Type 2 diabetes mellitus without complications: Secondary | ICD-10-CM | POA: Diagnosis not present

## 2016-07-28 DIAGNOSIS — Z79899 Other long term (current) drug therapy: Secondary | ICD-10-CM | POA: Diagnosis not present

## 2016-07-28 DIAGNOSIS — E782 Mixed hyperlipidemia: Secondary | ICD-10-CM | POA: Diagnosis not present

## 2016-11-24 DIAGNOSIS — M545 Low back pain: Secondary | ICD-10-CM | POA: Diagnosis not present

## 2016-11-24 DIAGNOSIS — E119 Type 2 diabetes mellitus without complications: Secondary | ICD-10-CM | POA: Diagnosis not present

## 2016-11-24 DIAGNOSIS — Z23 Encounter for immunization: Secondary | ICD-10-CM | POA: Diagnosis not present

## 2016-11-24 DIAGNOSIS — I10 Essential (primary) hypertension: Secondary | ICD-10-CM | POA: Diagnosis not present

## 2016-12-16 DIAGNOSIS — M5137 Other intervertebral disc degeneration, lumbosacral region: Secondary | ICD-10-CM | POA: Diagnosis not present

## 2016-12-16 DIAGNOSIS — M9903 Segmental and somatic dysfunction of lumbar region: Secondary | ICD-10-CM | POA: Diagnosis not present

## 2016-12-16 DIAGNOSIS — M9905 Segmental and somatic dysfunction of pelvic region: Secondary | ICD-10-CM | POA: Diagnosis not present

## 2016-12-16 DIAGNOSIS — M9904 Segmental and somatic dysfunction of sacral region: Secondary | ICD-10-CM | POA: Diagnosis not present

## 2017-03-03 DIAGNOSIS — R0989 Other specified symptoms and signs involving the circulatory and respiratory systems: Secondary | ICD-10-CM | POA: Diagnosis not present

## 2017-03-03 DIAGNOSIS — R0602 Shortness of breath: Secondary | ICD-10-CM | POA: Diagnosis not present

## 2017-03-05 DIAGNOSIS — R0602 Shortness of breath: Secondary | ICD-10-CM | POA: Diagnosis not present

## 2017-03-05 DIAGNOSIS — J101 Influenza due to other identified influenza virus with other respiratory manifestations: Secondary | ICD-10-CM | POA: Diagnosis not present

## 2017-05-11 DIAGNOSIS — M9904 Segmental and somatic dysfunction of sacral region: Secondary | ICD-10-CM | POA: Diagnosis not present

## 2017-05-11 DIAGNOSIS — M9903 Segmental and somatic dysfunction of lumbar region: Secondary | ICD-10-CM | POA: Diagnosis not present

## 2017-05-11 DIAGNOSIS — M5137 Other intervertebral disc degeneration, lumbosacral region: Secondary | ICD-10-CM | POA: Diagnosis not present

## 2017-05-11 DIAGNOSIS — M9905 Segmental and somatic dysfunction of pelvic region: Secondary | ICD-10-CM | POA: Diagnosis not present

## 2017-05-25 DIAGNOSIS — R0602 Shortness of breath: Secondary | ICD-10-CM | POA: Diagnosis not present

## 2017-05-31 DIAGNOSIS — T7840XA Allergy, unspecified, initial encounter: Secondary | ICD-10-CM | POA: Diagnosis not present

## 2017-06-18 DIAGNOSIS — J0140 Acute pansinusitis, unspecified: Secondary | ICD-10-CM | POA: Diagnosis not present

## 2017-09-29 DIAGNOSIS — M9904 Segmental and somatic dysfunction of sacral region: Secondary | ICD-10-CM | POA: Diagnosis not present

## 2017-09-29 DIAGNOSIS — M9903 Segmental and somatic dysfunction of lumbar region: Secondary | ICD-10-CM | POA: Diagnosis not present

## 2017-09-29 DIAGNOSIS — M5137 Other intervertebral disc degeneration, lumbosacral region: Secondary | ICD-10-CM | POA: Diagnosis not present

## 2017-09-29 DIAGNOSIS — M9905 Segmental and somatic dysfunction of pelvic region: Secondary | ICD-10-CM | POA: Diagnosis not present

## 2017-10-19 DIAGNOSIS — E785 Hyperlipidemia, unspecified: Secondary | ICD-10-CM | POA: Diagnosis not present

## 2017-10-19 DIAGNOSIS — Z23 Encounter for immunization: Secondary | ICD-10-CM | POA: Diagnosis not present

## 2017-10-19 DIAGNOSIS — R05 Cough: Secondary | ICD-10-CM | POA: Diagnosis not present

## 2017-10-19 DIAGNOSIS — I10 Essential (primary) hypertension: Secondary | ICD-10-CM | POA: Diagnosis not present

## 2017-10-19 DIAGNOSIS — E119 Type 2 diabetes mellitus without complications: Secondary | ICD-10-CM | POA: Diagnosis not present

## 2017-10-19 DIAGNOSIS — M545 Low back pain: Secondary | ICD-10-CM | POA: Diagnosis not present

## 2017-10-20 ENCOUNTER — Other Ambulatory Visit (HOSPITAL_COMMUNITY): Payer: Self-pay | Admitting: Family Medicine

## 2017-10-20 DIAGNOSIS — I350 Nonrheumatic aortic (valve) stenosis: Secondary | ICD-10-CM

## 2017-10-25 ENCOUNTER — Other Ambulatory Visit: Payer: Self-pay

## 2017-10-25 ENCOUNTER — Ambulatory Visit (HOSPITAL_COMMUNITY): Payer: BLUE CROSS/BLUE SHIELD | Attending: Cardiovascular Disease

## 2017-10-25 DIAGNOSIS — I350 Nonrheumatic aortic (valve) stenosis: Secondary | ICD-10-CM | POA: Diagnosis not present

## 2017-10-25 DIAGNOSIS — I4891 Unspecified atrial fibrillation: Secondary | ICD-10-CM | POA: Diagnosis not present

## 2017-10-30 DIAGNOSIS — G629 Polyneuropathy, unspecified: Secondary | ICD-10-CM | POA: Diagnosis not present

## 2017-10-30 DIAGNOSIS — F17200 Nicotine dependence, unspecified, uncomplicated: Secondary | ICD-10-CM | POA: Diagnosis not present

## 2017-11-12 DIAGNOSIS — I4891 Unspecified atrial fibrillation: Secondary | ICD-10-CM | POA: Diagnosis not present

## 2017-11-12 DIAGNOSIS — I482 Chronic atrial fibrillation, unspecified: Secondary | ICD-10-CM | POA: Insufficient documentation

## 2017-11-12 DIAGNOSIS — R931 Abnormal findings on diagnostic imaging of heart and coronary circulation: Secondary | ICD-10-CM | POA: Diagnosis not present

## 2017-11-12 DIAGNOSIS — S60132A Contusion of left middle finger with damage to nail, initial encounter: Secondary | ICD-10-CM | POA: Diagnosis not present

## 2017-11-12 DIAGNOSIS — R05 Cough: Secondary | ICD-10-CM | POA: Diagnosis not present

## 2017-11-12 HISTORY — DX: Unspecified atrial fibrillation: I48.91

## 2017-11-15 DIAGNOSIS — I4891 Unspecified atrial fibrillation: Secondary | ICD-10-CM | POA: Diagnosis not present

## 2017-11-17 ENCOUNTER — Telehealth: Payer: Self-pay | Admitting: Hematology and Oncology

## 2017-11-17 DIAGNOSIS — I4891 Unspecified atrial fibrillation: Secondary | ICD-10-CM | POA: Diagnosis not present

## 2017-11-17 NOTE — Telephone Encounter (Signed)
New referral received from Dr. Sheryn Bison for elevated platelet and wbc. Pt has been scheduled to see Dr. Audelia Hives on 11/1 at [place]. Pt aware to arrive 30 minutes early.

## 2017-11-18 ENCOUNTER — Encounter: Payer: BLUE CROSS/BLUE SHIELD | Admitting: Cardiothoracic Surgery

## 2017-11-19 ENCOUNTER — Encounter: Payer: Self-pay | Admitting: Hematology and Oncology

## 2017-11-19 ENCOUNTER — Inpatient Hospital Stay: Payer: BLUE CROSS/BLUE SHIELD

## 2017-11-19 ENCOUNTER — Inpatient Hospital Stay: Payer: BLUE CROSS/BLUE SHIELD | Attending: Hematology and Oncology | Admitting: Hematology and Oncology

## 2017-11-19 ENCOUNTER — Telehealth: Payer: Self-pay

## 2017-11-19 VITALS — BP 111/76 | HR 79 | Temp 98.3°F | Resp 18 | Ht 68.5 in | Wt 200.3 lb

## 2017-11-19 DIAGNOSIS — D473 Essential (hemorrhagic) thrombocythemia: Secondary | ICD-10-CM | POA: Insufficient documentation

## 2017-11-19 DIAGNOSIS — R3911 Hesitancy of micturition: Secondary | ICD-10-CM | POA: Insufficient documentation

## 2017-11-19 DIAGNOSIS — D72829 Elevated white blood cell count, unspecified: Secondary | ICD-10-CM

## 2017-11-19 DIAGNOSIS — D751 Secondary polycythemia: Secondary | ICD-10-CM | POA: Insufficient documentation

## 2017-11-19 DIAGNOSIS — Z79899 Other long term (current) drug therapy: Secondary | ICD-10-CM | POA: Diagnosis not present

## 2017-11-19 DIAGNOSIS — D75839 Thrombocytosis, unspecified: Secondary | ICD-10-CM

## 2017-11-19 DIAGNOSIS — E875 Hyperkalemia: Secondary | ICD-10-CM

## 2017-11-19 DIAGNOSIS — Z791 Long term (current) use of non-steroidal anti-inflammatories (NSAID): Secondary | ICD-10-CM | POA: Diagnosis not present

## 2017-11-19 DIAGNOSIS — Z7982 Long term (current) use of aspirin: Secondary | ICD-10-CM | POA: Insufficient documentation

## 2017-11-19 DIAGNOSIS — I4891 Unspecified atrial fibrillation: Secondary | ICD-10-CM | POA: Diagnosis not present

## 2017-11-19 DIAGNOSIS — I10 Essential (primary) hypertension: Secondary | ICD-10-CM | POA: Insufficient documentation

## 2017-11-19 DIAGNOSIS — Z7901 Long term (current) use of anticoagulants: Secondary | ICD-10-CM | POA: Diagnosis not present

## 2017-11-19 DIAGNOSIS — E1142 Type 2 diabetes mellitus with diabetic polyneuropathy: Secondary | ICD-10-CM

## 2017-11-19 DIAGNOSIS — D45 Polycythemia vera: Secondary | ICD-10-CM | POA: Insufficient documentation

## 2017-11-19 LAB — CBC WITH DIFFERENTIAL (CANCER CENTER ONLY)
Abs Immature Granulocytes: 2.09 10*3/uL — ABNORMAL HIGH (ref 0.00–0.07)
BASOS PCT: 2 %
Basophils Absolute: 0.6 10*3/uL — ABNORMAL HIGH (ref 0.0–0.1)
Eosinophils Absolute: 0.4 10*3/uL (ref 0.0–0.5)
Eosinophils Relative: 1 %
HCT: 51.6 % (ref 39.0–52.0)
Hemoglobin: 16.8 g/dL (ref 13.0–17.0)
IMMATURE GRANULOCYTES: 7 %
Lymphocytes Relative: 6 %
Lymphs Abs: 1.8 10*3/uL (ref 0.7–4.0)
MCH: 26.3 pg (ref 26.0–34.0)
MCHC: 32.6 g/dL (ref 30.0–36.0)
MCV: 80.8 fL (ref 80.0–100.0)
Monocytes Absolute: 1.6 10*3/uL — ABNORMAL HIGH (ref 0.1–1.0)
Monocytes Relative: 5 %
NEUTROS ABS: 24.2 10*3/uL — AB (ref 1.7–7.7)
NEUTROS PCT: 79 %
PLATELETS: 526 10*3/uL — AB (ref 150–400)
RBC: 6.39 MIL/uL — AB (ref 4.22–5.81)
RDW: 27.3 % — ABNORMAL HIGH (ref 11.5–15.5)
WBC Count: 30.7 10*3/uL — ABNORMAL HIGH (ref 4.0–10.5)
nRBC: 0.2 % (ref 0.0–0.2)

## 2017-11-19 LAB — COMPREHENSIVE METABOLIC PANEL
ALK PHOS: 136 U/L — AB (ref 38–126)
ALT: 21 U/L (ref 0–44)
ANION GAP: 11 (ref 5–15)
AST: 33 U/L (ref 15–41)
Albumin: 3.3 g/dL — ABNORMAL LOW (ref 3.5–5.0)
BUN: 12 mg/dL (ref 8–23)
CALCIUM: 10.1 mg/dL (ref 8.9–10.3)
CO2: 27 mmol/L (ref 22–32)
Chloride: 101 mmol/L (ref 98–111)
Creatinine, Ser: 0.85 mg/dL (ref 0.61–1.24)
GFR calc non Af Amer: >60 mL/min (ref >60)
Glucose, Bld: 109 mg/dL — ABNORMAL HIGH (ref 70–99)
Potassium: 4.4 mmol/L (ref 3.5–5.1)
Sodium: 139 mmol/L (ref 135–145)
Total Bilirubin: 1.2 mg/dL (ref 0.3–1.2)
Total Protein: 8.1 g/dL (ref 6.5–8.1)

## 2017-11-19 LAB — FERRITIN: Ferritin: 113 ng/mL (ref 24–336)

## 2017-11-19 LAB — LACTATE DEHYDROGENASE: LDH: 538 U/L — ABNORMAL HIGH (ref 98–192)

## 2017-11-19 LAB — URIC ACID: URIC ACID, SERUM: 8 mg/dL (ref 3.7–8.6)

## 2017-11-19 NOTE — Telephone Encounter (Signed)
Printed avs and calender of upcoming appointment. Per 11/1 los 

## 2017-11-19 NOTE — Patient Instructions (Signed)
We reviewed and discussed the laboratory studies from Dr. Sheryn Bison.  Those results suggest elevated white blood cells, red blood cells, and platelets.  We requested laboratory studies today to distinguish between a primary blood making problem versus a reactive or secondary process.  It is recommended that she start aspirin: 81 mg once daily.  Barring any unforeseen complications, your next scheduled doctor visit is on November 15.  Please do not hesitate to call should any questions or problems arise in the interim.  Thank you! Ladona Ridgel, MD Hematology/Oncology

## 2017-11-19 NOTE — Telephone Encounter (Signed)
Received a call from Butch Penny in flow cytometry stating that per Dr. Tresa Moore flow is not indicated for this patient. Dr. Audelia Hives made aware and given the number to reach Dr. Tresa Moore if needed.

## 2017-11-19 NOTE — Progress Notes (Signed)
[facility] Initial Consultation  Patient Name:  Justin [name]  DOB: [birth year]   Date of Service: November 19, 2017  Referring Provider: [name], MD [address] [place], [place] [address]   Consulting Physician: Justin Leber, MD Hematology/Oncology  Reason for Referral: In the setting of leukocytosis, thrombocytosis, and erythrocytosis, over an indefinite period, he presents now for further diagnostic and therapeutic recommendations.  History Present Illness: Justin [name] is a 62 year old resident of [place] whose past medical history is significant for non-insulin-requiring diabetes mellitus for the past 3 years; active tobacco use and dependence; longtime daily alcohol consumer, now discontinued; primary hypertension for at least the past 10 years; aortic stenosis; newly identified atrial fibrillation; peripheral vascular disease involving the left leg; urinary hesitancy; diabetic peripheral neuropathy; recent contusion of the third finger of the left hand; and degenerative joint disease involving the fingers, upper back, and lower back.  His primary care physician is Dr.[facility] [name].  His attending cardiologist is Dr. Tharon Aquas [name].  He is accompanied by his attentive wife Justin [name].  In the process of evaluating a recent cough associated with dyspnea, his laboratory studies revealed erythrocytosis, leukocytosis, and thrombocytosis.  A 2D echocardiogram was performed on October 7 which revealed a mild to moderate calcified aortic valve annulus. There were mildly thickened, moderately calcified leaflets.  Mild to moderate regurgitation was identified. Both right and left atria were mildly dilated. His mitral valve was normal.    On November 12, 2017: A complete blood count shows hemoglobin 16.6 hematocrit 50.3 MCV 80 MCH 26.4 RDW 25.5 WBC 30.6; platelets 595,000.  A basic metabolic panel showed sodium 135 potassium 6.4  chloride 95 CO2 19 BUN 11 creatinine 0.69 calcium 9.6.  PT/INR 12.9/1.2; aPTT 32.  He was prescribed furosemide for hyperkalemia.  His laboratory studies were repeated and have improved.  He fell about 3-4 weeks earlier and "jammed" the third finger on his left hand.  It was initially bruised, but not bleeding.  There is no laceration.  It subsequently turned purple and became numb.  It is steadily improving but is still discolored.  He has been using Neosporin topically.  He is an Occupational psychologist.  He grew up in [place] beginning at the age of 60 years. He has no known coronary artery disease, angina, or myocardial infarction. He denies seizure disorder or stroke syndrome.  He has no thyroid disease.  There is no peptic ulcer or gastroesophageal reflux disease.  He denies viral hepatitis, inflammatory bowel disease, or symptomatic diverticulosis.  His most recent colonoscopy was 2 years earlier in which benign polyps were identified and snared.  In addition to urinary hesitancy, he has nocturia 3-4 times nightly.  In his feet and toes he has had severe "burning" which is easily controlled with pregabalin. He has no rheumatoid or gouty arthritis. There is no venous thromboembolic disease. Over the past 6 months, he has voluntarily lost 45 pounds.    He reports no rash or itching.  He has had multiple squamous cell carcinomas removed from his arms.  He reports no visual changes or hearing deficit.  He has no unusual headaches, dizziness, lightheadedness, syncope, or near syncopal episodes. He has no unusual cough, sore throat, orthopnea.  He denies any dyspnea either at rest or on minimal exertion.  There is no pain or difficulty in swallowing.  No fever, shaking chills, sweats, or flulike symptoms are evident.  He has no heartburn or indigestion.  He denies nausea, vomiting, diarrhea, or constipation.  He denies chest or abdominal pain. There is no melena or bright red blood per rectum.  He reports  no urinary frequency, urgency, hematuria, or dysuria.  He has urinary hesitancy and nocturia, 2-4 times nightly.  Because of his peripheral vascular disease within the left lower extremity and atrial fibrillation, he continues on apixaban. It is with this background he presents now for further diagnostic and therapeutic recommendations in the setting of erythrocytosis, leukocytosis, and thrombocytosis as outlined above.  Past Medical History:  Diagnosis Date  . Acute meniscal tear of knee LEFT  . Arthritis   . Heart murmur MILD-- ASYMPTOMATIC  . Hyperlipidemia   . Hypertension   . Left knee pain    Surgical History: Past Surgical History:  Procedure Laterality Date  . APPENDECTOMY  1998  . CATARACT EXTRACTION W/ INTRAOCULAR LENS  IMPLANT, BILATERAL  1998/  2000  . CERVICAL FUSION  1985   C4 - 5  . KNEE ARTHROSCOPY W/ MENISCECTOMY  1991   LEFT KNEE  . NASAL SINUS SURGERY  1982  . ROTATOR CUFF REPAIR  09-04-2005   LEFT SHOULDER  . TONSILLECTOMY  AGE 4  : Family History  Adopted: Yes  Problem Relation Age of Onset  . Cancer Father   He is adopted and does not know his biological parents. He is not aware of any biological siblings.  Social History   Socioeconomic History  . Marital status: Married    Spouse name: Not on file  . Number of children: Not on file  . Years of education: Not on file  . Highest education level: Not on file  Occupational History  . Not on file  Social Needs  . Financial resource strain: Not on file  . Food insecurity:    Worry: Not on file    Inability: Not on file  . Transportation needs:    Medical: Not on file    Non-medical: Not on file  Tobacco Use  . Smoking status: Current Some Day Smoker    Packs/day: 0.25    Years: 23.00    Pack years: 5.75    Types: Cigars  . Smokeless tobacco: Never Used  . Tobacco comment: quit cigs in 2011 and smokes cigars daily- from 3-10 cigars-09/27/13  Substance and Sexual Activity  . Alcohol use: Yes     Comment: OCCASIONAL  . Drug use: No  . Sexual activity: Not on file  Lifestyle  . Physical activity:    Days per week: Not on file    Minutes per session: Not on file  . Stress: Not on file  Relationships  . Social connections:    Talks on phone: Not on file    Gets together: Not on file    Attends religious service: Not on file    Active member of club or organization: Not on file    Attends meetings of clubs or organizations: Not on file    Relationship status: Not on file  . Intimate partner violence:    Fear of current or ex partner: Not on file    Emotionally abused: Not on file    Physically abused: Not on file    Forced sexual activity: Not on file  Other Topics Concern  . Not on file  Social History Narrative  . Not on file  Lenis is married for the past 17 years. He is an Occupational psychologist. He began smoking at the age of 76 years.  He smoked a maximum 2 packs of cigarettes daily.   He stopped smoking the age of 41 years. He has no significant use of pipe or chewing tobacco. In 2008 he began to smoke cigars: 3-6 daily. He continues to smoke to the present. Having grown up in [place], he drank wine on a daily basis since the age of 64 years.   He he continues to drink mostly wine and beer several times weekly. He has not used e-cigarettes. He reports no recreational drug use.  Transfusion History: No prior transfusion  Exposure History: He has no known exposure to toxic chemicals, radiation, or pesticides.  Allergies  Allergen Reactions  . Codeine Swelling  He has no food or seasonal allergies  Current Outpatient Medications on File Prior to Visit  Medication Sig  . apixaban (ELIQUIS) 5 MG TABS tablet Take 5 mg by mouth 2 (two) times daily.  Marland Kitchen atenolol (TENORMIN) 50 MG tablet Take 50 mg by mouth daily.  . cyclobenzaprine (FLEXERIL) 10 MG tablet Take 10 mg by mouth 3 (three) times daily as needed.  Marland Kitchen Dextromethorphan-Guaifenesin (MUCINEX DM MAXIMUM  STRENGTH) 60-1200 MG TB12 Take 1 tablet by mouth every 12 (twelve) hours as needed.  . diphenhydrAMINE (BENADRYL) 12.5 MG/5ML elixir Take by mouth 4 (four) times daily as needed.  . fish oil-omega-3 fatty acids 1000 MG capsule Take 1 g by mouth 2 (two) times daily.  . fluticasone (FLONASE) 50 MCG/ACT nasal spray Place 2 sprays into both nostrils daily.  Marland Kitchen HYDROcodone-acetaminophen (NORCO/VICODIN) 5-325 MG per tablet Take 1 tablet by mouth every 6 (six) hours as needed for moderate pain.  Marland Kitchen loratadine (CLARITIN) 10 MG tablet Take 10 mg by mouth daily.  . meloxicam (MOBIC) 15 MG tablet Take 15 mg by mouth 2 (two) times daily.  . Multiple Vitamin (MULTIVITAMIN) tablet Take 1 tablet by mouth daily.  . nefazodone (SERZONE) 100 MG tablet Take 100 mg by mouth 2 (two) times daily.  . pentoxifylline (TRENTAL) 400 MG CR tablet Take 400 mg by mouth 2 (two) times daily.   . pregabalin (LYRICA) 50 MG capsule Take 50 mg by mouth 3 (three) times daily.  . simvastatin (ZOCOR) 40 MG tablet Take 40 mg by mouth every evening.  . zolpidem (AMBIEN) 10 MG tablet Take 10 mg by mouth at bedtime as needed.  Marland Kitchen aspirin 81 MG tablet Take 81 mg by mouth as needed for pain.   No current facility-administered medications on file prior to visit.     Review of Systems: Constitutional: No fever, sweats, or shaking chills.  No appetite deficit.  Voluntary 45 pound weight loss over the past 6 months. Skin: No rash, scaling, sores, lumps, or jaundice; previous squamous cell skin cancer completely excised from his arms HEENT: No visual changes or hearing deficit; no sinusitis; allergic rhinitis. Pulmonary: No unusual cough, sore throat, or orthopnea.  DOE/COPD; active smoker; tobacco dependence. Cardiovascular: No coronary artery disease, angina, or myocardial infarction.  Atrial fibrillation; essential hypertension and dyslipidemia. Gastrointestinal: No indigestion, dysphagia, abdominal pain, diarrhea, or constipation.  No change  in bowel habits.  No nausea or vomiting.  No melena or bright red blood per rectum. Genitourinary: No urinary frequency, urgency, hematuria, or dysuria; urinary hesitancy and nocturia (3-4 times nightly); renal calculi, 2007 Musculoskeletal: No new arthralgias or myalgias; degenerative joint disease in the fingers, upper back, and lower back.  No joint swelling, pain, or instability. Hematologic: No bleeding tendency; easy bruisability on apixaban. Endocrine: No intolerance to hot or cold;  no thyroid disease; non-insulin-requiring diabetes mellitus. Vascular: Left lower extremity peripheral arterial disease with claudication.  No venous thromboembolic disease. Psychological: No anxiety, depression, or mood changes; no mental health illnesses. Neurological: No dizziness, lightheadedness, syncope, or near syncopal episodes; no numbness or tingling in the fingers or toes.  Physical Examination: Vital Signs: Body surface area is 2.1 meters squared.  Vitals:   11/19/17 0915  BP: 111/76  Pulse: 79  Resp: 18  Temp: 98.3 F (36.8 C)  SpO2: 92%    Filed Weights   11/19/17 0915  Weight: 200 lb 4.8 oz (90.9 kg)  ECOG PERFORMANCE STATUS: 1 Constitutional:  Dayvion Sans is fully nourished and developed albeit overweight.  He looks age appropriate.  He is friendly and cooperative without respiratory compromise at rest. Skin: No rashes, scaling, dryness, jaundice, or itching. HEENT: Head is normocephalic and atraumatic.  Pupils are equal round and reactive to light and accommodation.  Nystagmus is present.  Sclerae are anicteric.  Conjunctivae are pink.  No sinus tenderness nor oropharyngeal lesions.  Lips without cracking or peeling; tongue without mass, inflammation, or nodularity.  Mucous membranes are moist. Neck: Supple and symmetric.  No jugular venous distention or thyromegaly.  Trachea is midline. Lymphatics: No cervical or supraclavicular lymphadenopathy.  No epitrochlear, axillary, or inguinal  lymphadenopathy is appreciated. Respiratory/chest: Thorax is symmetrical.  Breath sounds are clear to auscultation and percussion.  Normal excursion and respiratory effort. Back: Symmetric without deformity or tenderness. Cardiovascular: Heart rate and rhythm is irregularly irregular. Gastrointestinal: Abdomen is soft, nontender; no organomegaly.  Bowel sounds are normoactive.  No masses are appreciated. Extremities: On the third finger and his left hand in the distal interphalangeal joint, there is a bluish discoloration without laceration or tenderness at the site of previous work-related trauma.  In the lower extremities, there is no asymmetric swelling, erythema, tenderness, or cord formation.  No clubbing, cyanosis, nor edema. Hematologic: No petechiae, hematomas, or ecchymoses. Psychological:  He is oriented to person, place, and time; normal affect, memory, and cognition. Neurological: There are no gross neurologic deficits.  Laboratory Results: I have reviewed the data as listed: November 19, 2017:  Ref Range & Units 11:03 84yr ago  WBC Count 4.0 - 10.5 K/uL 30.7High   6.4   RBC 4.22 - 5.81 MIL/uL 6.39High   3.68Low  R  Hemoglobin 13.0 - 17.0 g/dL 16.8  11.7Low    HCT 39.0 - 52.0 % 51.6  34.2Low    MCV 80.0 - 100.0 fL 80.8  92.9 R  MCH 26.0 - 34.0 pg 26.3    MCHC 30.0 - 36.0 g/dL 32.6  34.2   RDW 11.5 - 15.5 % 27.3High   18.3High    Platelet Count 150 - 400 K/uL 526High   198.0 R  nRBC 0.0 - 0.2 % 0.2    Neutrophils Relative % % 79  64.0 R  Neutro Abs 1.7 - 7.7 K/uL 24.2High   4.1 R  Lymphocytes Relative % 6  25.3 R  Lymphs Abs 0.7 - 4.0 K/uL 1.8  1.6   Monocytes Relative % 5  9.1 R  Monocytes Absolute 0.1 - 1.0 K/uL 1.6High   0.6   Eosinophils Relative % 1  1.3 R  Eosinophils Absolute 0.0 - 0.5 K/uL 0.4  0.1 R  Basophils Relative % 2  0.3 R  Basophils Absolute 0.0 - 0.1 K/uL 0.6High   0.0   Immature Granulocytes % 7    Comment: FEW METAS AND MYELOS SEEN  Abs Immature  Granulocytes 0.00 - 0.07 K/uL 2.09High           Ref Range & Units 11:03  Sodium 135 - 145 mmol/L 139   Potassium 3.5 - 5.1 mmol/L 4.4   Chloride 98 - 111 mmol/L 101   CO2 22 - 32 mmol/L 27   Glucose, Bld 70 - 99 mg/dL 109High    BUN 8 - 23 mg/dL 12   Creatinine, Ser 0.61 - 1.24 mg/dL 0.85   Calcium 8.9 - 10.3 mg/dL 10.1   Total Protein 6.5 - 8.1 g/dL 8.1   Albumin 3.5 - 5.0 g/dL 3.3Low    AST 15 - 41 U/L 33   ALT 0 - 44 U/L 21   Alkaline Phosphatase 38 - 126 U/L 136High    Total Bilirubin 0.3 - 1.2 mg/dL 1.2   GFR calc non Af Amer >60 mL/min >60   GFR calc Af Amer >60 mL/min >60   LDH 538 Uric acid 8.0  Diagnostic/Imaging Studies: September 27, 2013 CHEST  2 VIEW  COMPARISON:  PA and lateral chest 09/18/2013.  FINDINGS: There is mild atelectasis in the lung bases. Lungs otherwise clear. Heart size normal. No pneumothorax or pleural effusion.  IMPRESSION: No acute disease.  Inge Rise M.D. 09/27/2013 11:54  Summary/Assessment: In the setting of leukocytosis, thrombocytosis, and erythrocytosis, over an indefinite period, he presents now for further diagnostic and therapeutic recommendations.  In the process of evaluating a recent cough associated with dyspnea, his laboratory studies revealed erythrocytosis, leukocytosis, and thrombocytosis.  A 2D echocardiogram was performed on October 7 which revealed a mild to moderate calcified aortic valve annulus. There were mildly thickened, moderately calcified leaflets.  Mild to moderate regurgitation was identified. Both right and left atria were mildly dilated. His mitral valve was normal.    On November 12, 2017: A complete blood count shows hemoglobin 16.6 hematocrit 50.3 MCV 80 MCH 26.4 RDW 25.5 WBC 30.6; platelets 595,000.  A basic metabolic panel showed sodium 135 potassium 6.4 chloride 95 CO2 19 BUN 11 creatinine 0.69 calcium 9.6.  PT/INR 12.9/1.2; aPTT 32.  He was prescribed furosemide for hyperkalemia.  His  laboratory studies were repeated and have improved.  He fell about 3-4 weeks earlier and "jammed" the third finger on his left hand.  It was initially bruised, but not bleeding.  There is no laceration.  It subsequently turned purple and became numb.  It is steadily improving but is still discolored.  He has been using Neosporin topically.  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 He has no known coronary artery disease, angina, or myocardial infarction. He denies seizure disorder or stroke syndrome.  He has no thyroid disease.  There is no peptic ulcer or gastroesophageal reflux disease.  He denies viral hepatitis, inflammatory bowel disease, or symptomatic diverticulosis.  His most recent colonoscopy was 2 years earlier in which benign polyps were identified and snared.  In addition to urinary hesitancy, he has nocturia 3-4 times nightly.  In his feet and toes he has had severe "burning" which is easily controlled with pregabalin. He has no rheumatoid or gouty arthritis. There is no venous thromboembolic disease. Over the past 6 months, he has voluntarily lost 45 pounds.    He reports no rash or itching.  He has had multiple squamous cell carcinomas removed from his arms.  He reports no visual changes or hearing deficit.  He has no unusual headaches, dizziness, lightheadedness,  syncope, or near syncopal episodes. He has no unusual cough, sore throat, orthopnea.  He denies any dyspnea either at rest or on minimal exertion.  There is no pain or difficulty in swallowing.  No fever, shaking chills, sweats, or flulike symptoms are evident.  He has no heartburn or indigestion.  He denies nausea, vomiting, diarrhea, or constipation.  He denies chest or abdominal pain. There is no melena or bright red blood per rectum.  He reports no urinary frequency, urgency, hematuria, or dysuria.  He has urinary hesitancy and nocturia, 2-4 times nightly.  Because of his  peripheral vascular disease within the left lower extremity and atrial fibrillation, he continues on apixaban.   His other comorbid problems include non-insulin-requiring diabetes mellitus for the past 3 years; active tobacco dependence; longtime daily alcohol consumer, now discontinued; primary hypertension for at least the past 10 years; dyslipidemia; allergic rhinitis; aortic stenosis; newly identified atrial fibrillation; peripheral vascular disease involving the left leg with intermittent claudication; urinary hesitancy; diabetic peripheral neuropathy; recent contusion of the third finger of the left hand; and degenerative joint disease involving the fingers, upper back, and lower back.    Recommendation/Plan: We had a lengthy discussion regarding the results of his previous laboratory studies, there is significance, and the importance of identifying an underlying primary versus reactive pancytosis.  The results of his laboratory studies were not available at the time of discharge.  Some of those results are outlined above.  Laboratory studies were obtained to exclude an underlying myeloproliferative process versus a secondary or reactive phenomenon.  It was recommended that he begin aspirin: 81 mg once daily with food.  I recommended in the most sensitive of terms that he consider discontinuing alcohol use and modify his cigar smoking.  It was recommended that he discuss both pharmacologic and behavioral intervention through his primary care office.  Gene mutation studies were performed to exclude a myeloproliferative disease such as polycythemia vera or chronic myelogenous leukemia.  The total time spent discussing the previous laboratory studies, importance, role, rationale, and methodology for evaluating pancytosis, physical examination, and outside record review, was 60 minutes. At least 50% of that time was spent in face-to-face discussion, reviewing outside records, laboratory  evaluation, counseling, and answering questions. All questions were answered to his satisfaction.  He was allotted ample time for both questions and answers.  This note was dictated using voice activated technology/software.  Unfortunately, typographical errors are not uncommon, and transcription is subject to mistakes and regrettably misinterpretation.  If necessary, clarification of the above information can be discussed with me at any time.  Thank you Dr. Sheryn Bison for allowing my participation in the care of Bardia Wangerin. I will keep you closely informed as the results of his preliminary laboratory data become available.  Please do not hesitate to call should any questions arise regarding this initial consultation and discussion.  FOLLOW UP: AS DIRECTED   cc:         Justin Dials, MD               Justin Aquas Trigt MD   Justin Leber, MD  Hematology/Oncology [facility] [address] [place], [place] [address] Office: [phone] XTKW: [phone]

## 2017-11-20 LAB — COAG STUDIES INTERP REPORT

## 2017-11-20 LAB — VON WILLEBRAND PANEL
COAGULATION FACTOR VIII: 127 % (ref 56–140)
Ristocetin Co-factor, Plasma: 154 % (ref 50–200)
VON WILLEBRAND ANTIGEN, PLASMA: 366 % — AB (ref 50–200)

## 2017-11-20 LAB — ERYTHROPOIETIN: Erythropoietin: 5.4 m[IU]/mL (ref 2.6–18.5)

## 2017-11-22 DIAGNOSIS — D72829 Elevated white blood cell count, unspecified: Secondary | ICD-10-CM | POA: Diagnosis not present

## 2017-11-22 DIAGNOSIS — E875 Hyperkalemia: Secondary | ICD-10-CM | POA: Diagnosis not present

## 2017-11-26 NOTE — Addendum Note (Signed)
Addended by: Richarda Osmond on: 11/26/2017 09:27 AM   Modules accepted: Orders

## 2017-12-01 ENCOUNTER — Other Ambulatory Visit: Payer: Self-pay

## 2017-12-01 ENCOUNTER — Encounter: Payer: Self-pay | Admitting: *Deleted

## 2017-12-01 ENCOUNTER — Institutional Professional Consult (permissible substitution): Payer: BLUE CROSS/BLUE SHIELD | Admitting: Cardiothoracic Surgery

## 2017-12-01 VITALS — BP 122/71 | HR 83 | Resp 16 | Ht 68.0 in | Wt 196.0 lb

## 2017-12-01 DIAGNOSIS — I350 Nonrheumatic aortic (valve) stenosis: Secondary | ICD-10-CM | POA: Diagnosis not present

## 2017-12-01 DIAGNOSIS — I4891 Unspecified atrial fibrillation: Secondary | ICD-10-CM | POA: Diagnosis not present

## 2017-12-01 HISTORY — DX: Nonrheumatic aortic (valve) stenosis: I35.0

## 2017-12-01 NOTE — Progress Notes (Signed)
PCP is [name], MD Referring Provider is [name], MD  Chief Complaint  Patient presents with  . Aortic Stenosis    Surgical eval...ECHO 10/25/17  Patient examined, images of most recent transthoracic echocardiogram studies personally reviewed and counseled with patient and wife.  HPI: Very nice 62 year old obese hypertensive diabetic smoker [2 small cigars daily] presents for evaluation of known mild-moderate aortic stenosis, long history of cardiac murmur, new onset atrial fibrillation, and decrease in ejection fraction since 2016 from LVEF 60% now LVEF 45%.  The aortic valve appears to be bicuspid with mild-moderate calcification, mild  AI, and a valve area that is not changed since 2016 [0.9] and transvalvular gradients that are unchanged[mean gradient 15 mmHg, peak gradient 29 mmHg] patient has had a murmur of aortic sclerosis-stenosis for 20+ years.  The patient denies chest pain presyncope or symptoms of CHF.  He works full-time as an Occupational psychologist.  He has noted recently some mild ankle edema after taking long trips.  The patient has classic symptoms of left leg claudication.  He has not had ABIs or arteriogram.  Patient has had previous orthopedic surgery on knee and shoulder without anesthesia difficulties or bleeding-clotting abnormalities.  Patient was noted to be in asymptomatic atrial fibrillation and placed on apixaban this fall.  He is on atenolol 50 mg daily for hypertension.  Patient also recently was noted to have significant leukocytosis with white count of 31,000 platelets 600,000 hemoglobin 16.6.  Results from hematology consultation by Dr. Audelia Hives are pending.  The patient's von Willebrand factor is increased.   Past Medical History:  Diagnosis Date  . Acute meniscal tear of knee LEFT  . Aortic stenosis 12/01/2017   NONRHEUMATIC, AORTIC VALVE CALCIFICATIONS, MILD TO MODERATE REGURG, MILD TO MODERATE CALCIFIED ANNULUS per ECHO 10/25/17 @ [facility]  . Arthritis   . Atrial fibrillation ([place]) 11/12/2017   AT O/V WITH PCP  . Heart murmur MILD-- ASYMPTOMATIC  . Hyperlipidemia   . Hypertension   . Left knee pain     Past Surgical History:  Procedure Laterality Date  . APPENDECTOMY  1998  . CATARACT EXTRACTION W/ INTRAOCULAR LENS  IMPLANT, BILATERAL  1998/  2000  . CERVICAL FUSION  1985   C4 - 5  . KNEE ARTHROSCOPY W/ MENISCECTOMY  1991   LEFT KNEE  . NASAL SINUS SURGERY  1982  . ROTATOR CUFF REPAIR  09-04-2005   LEFT SHOULDER  . TONSILLECTOMY  AGE 41    Family History  Adopted: Yes  Problem Relation Age of Onset  . Cancer Father     Social History Social History   Tobacco Use  . Smoking status: Current Some Day Smoker    Packs/day: 0.25    Years: 23.00    Pack years: 5.75    Types: Cigars  . Smokeless tobacco: Never Used  . Tobacco comment: quit cigs in 2011 and smokes cigars daily- from 3-10 cigars-09/27/13  Substance Use Topics  . Alcohol use: Yes    Comment: OCCASIONAL  . Drug use: No    Current Outpatient Medications  Medication Sig Dispense Refill  . apixaban (ELIQUIS) 5 MG TABS tablet Take 5 mg by mouth 2 (two) times daily.    Marland Kitchen aspirin 81 MG tablet Take 81 mg by mouth as needed for pain.    Marland Kitchen atenolol (TENORMIN) 50 MG tablet Take 50 mg by mouth daily.    . cyclobenzaprine (FLEXERIL) 10 MG tablet Take 10 mg by mouth 3 (three)  times daily as needed.    . fish oil-omega-3 fatty acids 1000 MG capsule Take 1 g by mouth 2 (two) times daily.    . fluticasone (FLONASE) 50 MCG/ACT nasal spray Place 2 sprays into both nostrils daily.    Marland Kitchen HYDROcodone-acetaminophen (NORCO/VICODIN) 5-325 MG per tablet Take 1 tablet by mouth every 6 (six) hours as needed for moderate pain.    Marland Kitchen loratadine (CLARITIN) 10 MG tablet Take 10 mg by mouth daily.    . meloxicam (MOBIC) 15 MG tablet Take 15 mg by mouth 2 (two) times daily.    . Multiple Vitamin (MULTIVITAMIN) tablet Take 1 tablet by mouth daily.    . nefazodone  (SERZONE) 100 MG tablet Take 100 mg by mouth 2 (two) times daily.    . pregabalin (LYRICA) 50 MG capsule Take 50 mg by mouth 3 (three) times daily.    . simvastatin (ZOCOR) 40 MG tablet Take 40 mg by mouth every evening.    . zolpidem (AMBIEN) 10 MG tablet Take 10 mg by mouth at bedtime as needed.     No current facility-administered medications for this visit.     Allergies  Allergen Reactions  . Codeine Swelling    Review of Systems   Patient is lost 30 to 40 pounds in the last 6 to 8 months Patient denies presyncope palpitations or orthopnea Patient denies any active dental complaints and had a dental hygiene cleaning-he has his teeth cleaned 4 times a year. No difficulty swallowing No history of DVT or varicose veins No significant bleeding problems on apixaban Insomnia on Ambien Mild to moderate COPD chronic cough Primary hypertension Type 2 diabetes BPH polyuria  BP 122/71 (BP Location: Right Arm, Patient Position: Sitting, Cuff Size: Large)   Pulse 83   Resp 16   Ht 5\' 8"  (1.727 m)   Wt 196 lb (88.9 kg)   SpO2 100% Comment: ON RA  BMI 29.80 kg/m  Physical Exam      Physical Exam  General: Middle-aged obese male, alert and comfortable but anxious HEENT: Normocephalic pupils equal , dentition adequate Neck: Supple without JVD, adenopathy, or bruit Chest: Clear to auscultation, symmetrical breath sounds, no rhonchi, no tenderness             or deformity Cardiovascular: Irregular rhythm of A. fib,, 3/6 AS murmur murmur, no gallop, peripheral pulses nonpalpable in the left lower extremity              Abdomen:  Soft, nontender, no palpable mass or organomegaly Extremities: Warm, well-perfused, no clubbing cyanosis edema or tenderness,              no venous stasis changes of the legs Rectal/GU: Deferred Neuro: Grossly non--focal and symmetrical throughout Skin: Clean and dry without rash or ulceration   Diagnostic Tests: Echocardiogram images personally  reviewed showing mild-moderate aortic stenosis with reduction in global LVEF on the most recent echo, 45%  Impression: Patient's aortic stenosis appears to be stable but now his ejection fraction has shown a significant decrease.  He is also with new onset atrial fibrillation.  Patient is at high risk for CAD with multiple risk factors and also he has classic symptoms of claudication-peripheral vascular disease.  He also appears to have a hematologic disorder which is being actively investigated at this time. I recommended to the patient that we get a TEE for better images of his valvular disease and left and right heart catheterization to assess for ischemic heart disease and to  determine hemodynamic consequence of significant aortic stenosis.  Plan: Patient return after TEE, left and right heart cath.  Fortunately he has an appointment with cardiology, Dr. Candee Furbish.   Len Childs, MD [facility] [phone]

## 2017-12-02 ENCOUNTER — Encounter (HOSPITAL_COMMUNITY): Payer: Self-pay | Admitting: *Deleted

## 2017-12-02 LAB — JAK2 (INCLUDING V617F AND EXON 12), MPL,& CALR-NEXT GEN SEQ

## 2017-12-02 LAB — BCR ABL1 FISH (GENPATH)

## 2017-12-03 ENCOUNTER — Inpatient Hospital Stay (HOSPITAL_BASED_OUTPATIENT_CLINIC_OR_DEPARTMENT_OTHER): Payer: BLUE CROSS/BLUE SHIELD | Admitting: Hematology and Oncology

## 2017-12-03 ENCOUNTER — Telehealth: Payer: Self-pay

## 2017-12-03 ENCOUNTER — Encounter: Payer: Self-pay | Admitting: Hematology and Oncology

## 2017-12-03 VITALS — BP 104/69 | HR 83 | Temp 97.8°F | Resp 19 | Ht 68.0 in | Wt 202.9 lb

## 2017-12-03 DIAGNOSIS — Z791 Long term (current) use of non-steroidal anti-inflammatories (NSAID): Secondary | ICD-10-CM

## 2017-12-03 DIAGNOSIS — Z7901 Long term (current) use of anticoagulants: Secondary | ICD-10-CM

## 2017-12-03 DIAGNOSIS — I4891 Unspecified atrial fibrillation: Secondary | ICD-10-CM | POA: Diagnosis not present

## 2017-12-03 DIAGNOSIS — D72829 Elevated white blood cell count, unspecified: Secondary | ICD-10-CM | POA: Diagnosis not present

## 2017-12-03 DIAGNOSIS — Z79899 Other long term (current) drug therapy: Secondary | ICD-10-CM | POA: Diagnosis not present

## 2017-12-03 DIAGNOSIS — D45 Polycythemia vera: Secondary | ICD-10-CM

## 2017-12-03 DIAGNOSIS — E1142 Type 2 diabetes mellitus with diabetic polyneuropathy: Secondary | ICD-10-CM | POA: Diagnosis not present

## 2017-12-03 DIAGNOSIS — D471 Chronic myeloproliferative disease: Secondary | ICD-10-CM

## 2017-12-03 DIAGNOSIS — R3911 Hesitancy of micturition: Secondary | ICD-10-CM

## 2017-12-03 DIAGNOSIS — Z7982 Long term (current) use of aspirin: Secondary | ICD-10-CM | POA: Diagnosis not present

## 2017-12-03 DIAGNOSIS — E875 Hyperkalemia: Secondary | ICD-10-CM | POA: Diagnosis not present

## 2017-12-03 DIAGNOSIS — I10 Essential (primary) hypertension: Secondary | ICD-10-CM

## 2017-12-03 MED ORDER — HYDROXYUREA 500 MG PO CAPS: 500.0000 mg | ORAL_CAPSULE | Freq: Two times a day (BID) | ORAL | 1 refills | Status: DC

## 2017-12-03 NOTE — Progress Notes (Signed)
Hematology/Oncology Outpatient Progress Note  Patient Name:  Justin [name]  DOB: 1955-02-26   Date of Service: December 03, 2017  Referring Provider: [name], MD [address] [place], [place] [address]   Consulting Physician: Henreitta Leber, MD Hematology/Oncology  Reason for Visit: In the setting of leukocytosis, thrombocytosis, and erythrocytosis over an indefinite period; JAK2 V617F (83%) and CALR gene mutation (4%) studies identified and consistent with a myeloproliferative neoplasm, polycythemia vera rubra, he presents now for further discussion and therapeutic recommendations.  Brief History: Justin [name] is a 62 year old resident of [place] whose past medical history is significant for non-insulin-requiring diabetes mellitus for the past 3 years; active tobacco use and dependence; longtime daily alcohol consumer, now discontinued; primary hypertension for at least the past 10 years; aortic stenosis; newly identified atrial fibrillation; peripheral vascular disease involving the left leg; urinary hesitancy; diabetic peripheral neuropathy; recent contusion of the third finger of the left hand; and degenerative joint disease involving the fingers, upper back, and lower back.  His primary care physician is Dr.Robert Psychiatric nurse.  His attending cardiologist is Dr. Tharon Aquas Trigt.  He is accompanied by his attentive wife Justin [name].  In the process of evaluating a recent cough associated with dyspnea, his laboratory studies revealed erythrocytosis, leukocytosis, and thrombocytosis.  A 2D echocardiogram was performed on October 7 which revealed a mild to moderate calcified aortic valve annulus. There were mildly thickened, moderately calcified leaflets.  Mild to moderate regurgitation was identified. Both right and left atria were mildly dilated. His mitral valve was normal. On November 12, 2017: A complete blood count shows hemoglobin 16.6 hematocrit 50.3 MCV 80 MCH 26.4 RDW 25.5  WBC 30.6; platelets 595,000.  A basic metabolic panel showed sodium 135 potassium 6.4 chloride 95 CO2 19 BUN 11 creatinine 0.69 calcium 9.6.  PT/INR 12.9/1.2; aPTT 32.    He was prescribed furosemide for hyperkalemia.  His laboratory studies were repeated and have improved.  He fell about 3-4 weeks earlier and "jammed" the third finger on his left hand.  It was initially bruised, but not bleeding. There is no laceration. It subsequently turned purple and became numb.  It has steadily improved but is no longer discolored. He is an Occupational psychologist.  He grew up in [place] beginning at the age of 62 years. He has no known coronary artery disease, angina, or myocardial infarction. He denies seizure disorder or stroke syndrome. Over the past 6 months, he has voluntarily lost 45 pounds. In addition to urinary hesitancy, he has nocturia 3-4 times nightly.  In his feet and toes he has had severe "burning" which is easily controlled with pregabalin.  He has no venous thromboembolic disease.  In 2008 he began to smoke cigars: 3-6 daily.  He no longer smokes cigarettes.  He continues to smoke to the present. Having grown up in [place], he drank wine on a daily basis since the age of 62 years.  He continues to drink mostly wine and beer much less frequently  At the time of his initial visit, laboratory studies were obtained to exclude an underlying myeloproliferative process. Those results suggest an underlying myeloproliferative neoplasm consistent with polycythemia vera rubra.  Both JAK2 V617F (83%) and CALR (4%) genetic alterations were identified.  It is with this background he presents now for further discussion and therapeutic recommendations in the setting of newly identified polycythemia vera as outlined above.  Interval History: In the interim since his last visit, he reports no new problems  or complaints.  A coronary angiography is planned on December 6 under the direction of Dr. Prescott Gum,  interventional cardiology.  He reports no bleeding tendency.  There is no fever, shaking chills, sweats, or flulike symptoms.  He reports no rash or itching.  He has had multiple squamous cell carcinomas removed from his arms.  He reports no visual changes or hearing deficit.  He has no unusual headaches, dizziness, lightheadedness, syncope, or near syncopal episodes. He has no unusual cough, sore throat, orthopnea.  He denies any dyspnea either at rest or on minimal exertion.  There is no pain or difficulty in swallowing. He has no heartburn or indigestion.  He denies nausea, vomiting, diarrhea, or constipation. He denies chest or abdominal pain.There is no melena or bright red blood per rectum. He reports no urinary frequency, urgency, hematuria, or dysuria.  He has urinary hesitancy and nocturia, 2-4 times nightly. Because of his peripheral vascular disease within the left lower extremity and atrial fibrillation, he continues on apixaban.   Past Medical History:  Diagnosis Date  . Acute meniscal tear of knee LEFT  . Aortic stenosis 12/01/2017   NONRHEUMATIC, AORTIC VALVE CALCIFICATIONS, MILD TO MODERATE REGURG, MILD TO MODERATE CALCIFIED ANNULUS per ECHO 10/25/17 @ [facility]  . Arthritis   . Atrial fibrillation ([place]) 11/12/2017   AT O/V WITH PCP  . Heart murmur MILD-- ASYMPTOMATIC  . Hyperlipidemia   . Hypertension   . Left knee pain    Surgical History: Past Surgical History:  Procedure Laterality Date  . APPENDECTOMY  1998  . CATARACT EXTRACTION W/ INTRAOCULAR LENS  IMPLANT, BILATERAL  1998/  2000  . CERVICAL FUSION  1985   C4 - 5  . KNEE ARTHROSCOPY W/ MENISCECTOMY  1991   LEFT KNEE  . NASAL SINUS SURGERY  1982  . ROTATOR CUFF REPAIR  09-04-2005   LEFT SHOULDER  . TONSILLECTOMY  AGE 62   Allergies  Allergen Reactions  . Codeine Swelling  He has no food or seasonal allergies  Current Outpatient Medications on File Prior to Visit  Medication Sig  . apixaban (ELIQUIS) 5  MG TABS tablet Take 5 mg by mouth 2 (two) times daily.  Marland Kitchen aspirin 81 MG tablet Take 81 mg by mouth as needed for pain.  Marland Kitchen atenolol (TENORMIN) 50 MG tablet Take 50 mg by mouth daily.  . cyclobenzaprine (FLEXERIL) 10 MG tablet Take 10 mg by mouth 3 (three) times daily as needed.  . fish oil-omega-3 fatty acids 1000 MG capsule Take 1 g by mouth 2 (two) times daily.  . fluticasone (FLONASE) 50 MCG/ACT nasal spray Place 2 sprays into both nostrils daily.  Marland Kitchen HYDROcodone-acetaminophen (NORCO/VICODIN) 5-325 MG per tablet Take 1 tablet by mouth every 6 (six) hours as needed for moderate pain.  Marland Kitchen loratadine (CLARITIN) 10 MG tablet Take 10 mg by mouth daily.  . meloxicam (MOBIC) 15 MG tablet Take 15 mg by mouth 2 (two) times daily.  . Multiple Vitamin (MULTIVITAMIN) tablet Take 1 tablet by mouth daily.  . nefazodone (SERZONE) 100 MG tablet Take 100 mg by mouth 2 (two) times daily.  . pregabalin (LYRICA) 50 MG capsule Take 50 mg by mouth 3 (three) times daily.  . simvastatin (ZOCOR) 40 MG tablet Take 40 mg by mouth every evening.  . zolpidem (AMBIEN) 10 MG tablet Take 10 mg by mouth at bedtime as needed.   No current facility-administered medications on file prior to visit.     Review of Systems:  Constitutional: No fever, sweats, or shaking chills.  No appetite deficit.  Voluntary 45 pound weight loss over the past 6 months. Skin: No rash, scaling, sores, lumps, or jaundice; previous squamous cell skin cancer completely excised from his arms HEENT: No visual changes or hearing deficit; no sinusitis; allergic rhinitis. Pulmonary: No unusual cough, sore throat, or orthopnea.  DOE/COPD; active smoker; tobacco dependence. Cardiovascular: No coronary artery disease, angina, or myocardial infarction.  Atrial fibrillation; essential hypertension and dyslipidemia. Gastrointestinal: No indigestion, dysphagia, abdominal pain, diarrhea, or constipation.  No change in bowel habits.  No nausea or vomiting.  No melena  or bright red blood per rectum. Genitourinary: No urinary frequency, urgency, hematuria, or dysuria; urinary hesitancy and nocturia (3-4 times nightly); renal calculi, 2007 Musculoskeletal: No new arthralgias or myalgias; degenerative joint disease in the fingers, upper back, and lower back.  No joint swelling, pain, or instability. Hematologic: No bleeding tendency; easy bruisability on apixaban. Endocrine: No intolerance to hot or cold; no thyroid disease; non-insulin-requiring diabetes mellitus. Vascular: Left lower extremity peripheral arterial disease with claudication.  No venous thromboembolic disease. Psychological: No anxiety, depression, or mood changes; no mental health illnesses. Neurological: No dizziness, lightheadedness, syncope, or near syncopal episodes; no numbness or tingling in the fingers or toes.  Physical Examination: Vital Signs: Body surface area is 2.1 meters squared.  Vitals:   12/03/17 0916  BP: 104/69  Pulse: 83  Resp: 19  Temp: 97.8 F (36.6 C)  SpO2: 95%    Filed Weights   12/03/17 0916  Weight: 202 lb 14.4 oz (92 kg)  ECOG PERFORMANCE STATUS: 1 Constitutional:  Justin [name] is fully nourished and developed albeit overweight.  He looks age appropriate.  He is friendly and cooperative without respiratory compromise at rest. Skin: No rashes, scaling, dryness, jaundice, or itching. HEENT: Head is normocephalic and atraumatic.  Pupils are equal round and reactive to light and accommodation.  Nystagmus is present.  Sclerae are anicteric.  Conjunctivae are pink.  No sinus tenderness nor oropharyngeal lesions.  Lips without cracking or peeling; tongue without mass, inflammation, or nodularity.  Mucous membranes are moist. Neck: Supple and symmetric.  No jugular venous distention or thyromegaly.  Trachea is midline. Lymphatics: No cervical or supraclavicular lymphadenopathy.  No epitrochlear, axillary, or inguinal lymphadenopathy is appreciated. Respiratory/chest:  Thorax is symmetrical.  Breath sounds are clear to auscultation and percussion.  Normal excursion and respiratory effort. Back: Symmetric without deformity or tenderness. Cardiovascular: Heart rate and rhythm is irregularly irregular. Gastrointestinal: Abdomen is soft, nontender; no organomegaly.  Bowel sounds are normoactive.  No masses are appreciated. Extremities: On the third finger and his left hand in the distal interphalangeal joint, there is a bluish discoloration without laceration or tenderness at the site of previous work-related trauma.  In the lower extremities, there is no asymmetric swelling, erythema, tenderness, or cord formation.  No clubbing, cyanosis, nor edema. Hematologic: No petechiae, hematomas, or ecchymoses. Psychological:  He is oriented to person, place, and time; normal affect, memory, and cognition. Neurological: There are no gross neurologic deficits.  Laboratory Results: I have reviewed the data as listed: November 19, 2017:  Ref Range & Units 11:03 60yr ago  WBC Count 4.0 - 10.5 K/uL 30.7High   6.4   RBC 4.22 - 5.81 MIL/uL 6.39High   3.68Low  R  Hemoglobin 13.0 - 17.0 g/dL 16.8  11.7Low    HCT 39.0 - 52.0 % 51.6  34.2Low    MCV 80.0 - 100.0 fL 80.8  92.9 R  MCH 26.0 - 34.0 pg 26.3    MCHC 30.0 - 36.0 g/dL 32.6  34.2   RDW 11.5 - 15.5 % 27.3High   18.3High    Platelet Count 150 - 400 K/uL 526High   198.0 R  nRBC 0.0 - 0.2 % 0.2    Neutrophils Relative % % 79  64.0 R  Neutro Abs 1.7 - 7.7 K/uL 24.2High   4.1 R  Lymphocytes Relative % 6  25.3 R  Lymphs Abs 0.7 - 4.0 K/uL 1.8  1.6   Monocytes Relative % 5  9.1 R  Monocytes Absolute 0.1 - 1.0 K/uL 1.6High   0.6   Eosinophils Relative % 1  1.3 R  Eosinophils Absolute 0.0 - 0.5 K/uL 0.4  0.1 R  Basophils Relative % 2  0.3 R  Basophils Absolute 0.0 - 0.1 K/uL 0.6High   0.0   Immature Granulocytes % 7    Comment: FEW METAS AND MYELOS SEEN  Abs Immature Granulocytes 0.00 - 0.07 K/uL 2.09High           Ref  Range & Units 11:03  Sodium 135 - 145 mmol/L 139   Potassium 3.5 - 5.1 mmol/L 4.4   Chloride 98 - 111 mmol/L 101   CO2 22 - 32 mmol/L 27   Glucose, Bld 70 - 99 mg/dL 109High    BUN 8 - 23 mg/dL 12   Creatinine, Ser 0.61 - 1.24 mg/dL 0.85   Calcium 8.9 - 10.3 mg/dL 10.1   Total Protein 6.5 - 8.1 g/dL 8.1   Albumin 3.5 - 5.0 g/dL 3.3Low    AST 15 - 41 U/L 33   ALT 0 - 44 U/L 21   Alkaline Phosphatase 38 - 126 U/L 136High    Total Bilirubin 0.3 - 1.2 mg/dL 1.2   GFR calc non Af Amer >60 mL/min >60   GFR calc Af Amer >60 mL/min >60   LDH 538 Uric acid 8.0 Ferritin 113 Erythropoietin 5.4  Coag Studies Interp Report  Von Willebrand panel Status:  Final result Visible to patient:  Yes (MyChart) Next appt:  12/07/2017 at 02:20 PM in Cardiology Buford Dresser, MD)  Component 2wk ago  Interpretation Note   Comment: (NOTE)  -------------------------------  COAGULATION:  VON WILLEBRAND FACTOR ASSESSMENT CURRENT RESULTS ASSESSMENT  The VWF:Ag is elevated. The VWF:RCo is normal. The FVIII is  normal.  VON WILLEBRAND FACTOR ASSESSMENT CURRENT RESULTS  INTERPRETATION  -  These results are not consistent with a diagnosis of VWD  according to the current NHLBI guideline. VWF:Ag may be  spuriously elevated in the presence of rheumatoid factor  since the VWF:Ag is significantly elevated out of proportion  to both VWF:RCo and FVIII activity.  VON WILLEBRAND FACTOR ASSESSMENT        Diagnostic/Imaging Studies: September 27, 2013 CHEST  2 VIEW  COMPARISON:  PA and lateral chest 09/18/2013.  FINDINGS: There is mild atelectasis in the lung bases. Lungs otherwise clear. Heart size normal. No pneumothorax or pleural effusion.  IMPRESSION: No acute disease.  Inge Rise M.D. 09/27/2013 11:54  Media Information           Document Information Pathology  [facility]  11/19/2017 23:59  Attached To:  Molecular Pathology Report Lora Paula [[id number]]  Scanned  Document on 11/19/17 with [provider]  Source Information   Hegarty, Kimberle A  [facility]   Summary/Assessment: In the setting of leukocytosis, thrombocytosis, and erythrocytosis over an indefinite period; JAK2 V617F (83%) and CALR gene mutation (  4%) studies identified and consistent with a myeloproliferative neoplasm, polycythemia vera rubra, he presents now for further discussion and therapeutic recommendations.  In the process of evaluating a recent cough associated with dyspnea, his laboratory studies revealed erythrocytosis, leukocytosis, and thrombocytosis.  A 2D echocardiogram was performed on October 7 which revealed a mild to moderate calcified aortic valve annulus. There were mildly thickened, moderately calcified leaflets.  Mild to moderate regurgitation was identified. Both right and left atria were mildly dilated. His mitral valve was normal. On November 12, 2017: A complete blood count shows hemoglobin 16.6 hematocrit 50.3 MCV 80 MCH 26.4 RDW 25.5 WBC 30.6; platelets 595,000.  A basic metabolic panel showed sodium 135 potassium 6.4 chloride 95 CO2 19 BUN 11 creatinine 0.69 calcium 9.6.  PT/INR 12.9/1.2; aPTT 32.    He was prescribed furosemide for hyperkalemia.  His laboratory studies were repeated and have improved.  He fell about 3-4 weeks earlier and "jammed" the third finger on his left hand.  It was initially bruised, but not bleeding. There is no laceration. It subsequently turned purple and became numb.  It has steadily improved but is no longer discolored. He is an Occupational psychologist.  He grew up in [place] beginning at the age of 41 years. He has no known coronary artery disease, angina, or myocardial infarction. He denies seizure disorder or stroke syndrome. Over the past 6 months, he has voluntarily lost 45 pounds. In addition to urinary hesitancy, he has nocturia 3-4 times nightly.  In his feet and toes he has had severe "burning" which is easily  controlled with pregabalin.  He has no venous thromboembolic disease.  In 2008 he began to smoke cigars: 3-6 daily.  He no longer smokes cigarettes.  He continues to smoke to the present. Having grown up in [place], he drank wine on a daily basis since the age of 49 years.  He continues to drink mostly wine and beer several times weekly .At the time of his initial visit, laboratory studies were obtained to exclude an underlying myeloproliferative process. Those results suggest an underlying myeloproliferative neoplasm consistent with polycythemia vera rubra.  Both JAK2 V617F (83%) and CALR (4%) genetic alterations were identified.    In the interim since his last visit, he reports no new problems or complaints.  A coronary angiography is planned on December 6 under the direction of Dr. Prescott Gum, interventional cardiology.  He reports no bleeding tendency.  There is no fever, shaking chills, sweats, or flulike symptoms.  He reports no rash or itching.  He has had multiple squamous cell carcinomas removed from his arms.  He reports no visual changes or hearing deficit.  He has no unusual headaches, dizziness, lightheadedness, syncope, or near syncopal episodes. He has no unusual cough, sore throat, orthopnea.  He denies any dyspnea either at rest or on minimal exertion.  There is no pain or difficulty in swallowing. He has no heartburn or indigestion.  He denies nausea, vomiting, diarrhea, or constipation. He denies chest or abdominal pain.There is no melena or bright red blood per rectum. He reports no urinary frequency, urgency, hematuria, or dysuria.  He has urinary hesitancy and nocturia, 2-4 times nightly. Because of his peripheral vascular disease within the left lower extremity and atrial fibrillation, he continues on apixaban.    His other comorbid problems include non-insulin-requiring diabetes mellitus for the past 3 years; active tobacco dependence; longtime daily alcohol consumer, now discontinued;  primary hypertension for at least the past 10 years;  dyslipidemia; allergic rhinitis; aortic stenosis; newly identified atrial fibrillation; peripheral vascular disease involving the left leg with intermittent claudication; urinary hesitancy; diabetic peripheral neuropathy; recent contusion of the third finger of the left hand; and degenerative joint disease involving the fingers, upper back, and lower back.    Recommendation/Plan: We discussed in detail the results of his prior laboratory studies which suggest a myeloproliferative neoplasm consistent with polycythemia vera. Those results are detailed above.  Literature was given regarding the natural history and behavior of polycythemia vera.  It is one of those myeloproliferative neoplasms in a group of hematopoietic stem cell derived malignancies that are characterized by clonal proliferation of myeloid cells with variable degrees of morphologic maturity. There is, as with other myeloproliferative neoplasms an increased risk for thromboembolic events, leukemic transformation, and or transformation into myelofibrosis.  Treatment of polycythemia vera can control symptoms, improve survival, and reduce the risk of thrombotic events. To date, no drug has been shown to lower the risk of hematologic transformation to MF or AML/MDS.Cytoreductive agents are part of the routine management of those individuals with high-risk features (ie, age >60 years and/or history of thrombosis), along with phlebotomy, and low-dose aspirin therapy.   Because cytoreductive therapy with hydroxyurea is necessary given his elevated white blood cell and platelet count, phlebotomy therapy will be reserved if necessary.  He has a baseline hemoglobin is 16.8 g/dL.  A prescription was given for hydroxyurea, the preferred agent for initial cytoreductive therapy because of its efficacy, ease of administration, lower cost, long-term safety profile, and favorable toxicity profile.  It is  generally effective in lowering platelet counts and reducing thrombotic risk in polycythemia vera.  He is started on hydroxyurea: 500 mg twice daily.  This dose should be adjusted to achieve a platelet count between 100,000-400,000 to minimize neutropenia and anemia.  Although generally well-tolerated, we did discuss the potential side effect profile of hydroxyurea.  Those side effects include cytopenias, mucocutaneous ulcers, skin rash, diarrhea, peripheral neuropathy, skin cancer, and in rare cases pulmonary toxicity.  Single agent hydroxyurea is not associated with an increased risk of hematologic complications such as leukemic transformation, or myelofibrosis.  Although previously recommended, he did not start low-dose because of his anticipated angiography.  It was recommended that he begin aspirin: 81 mg once daily.  Should he have an invasive procedure, and Dr. Prescott Gum agrees, he should discontinue aspirin 2 weeks in advance to secure adequate hemostasis.  Occasionally individuals with polycythemia vera have a bleeding tendency. His von Willebrand studies did not identify an acquired von Willebrand disease and an acquired bleeding diathesis.  Those individuals with a myeloproliferative neoplasm, however, are at risk for thrombosis or bleeding.  A follow-up visit has been scheduled for December 3.  At that time laboratory studies will be obtained with adjustment in hydroxyurea and or therapeutic phlebotomy as necessary.  From a cardiology perspective, it is probably more prudent and safer to wait until his blood counts are closer to normal.  This would minimize any undue risk.  Being on apixaban is an additional layer of protection against thrombosis. There is no contraindication to continuing apixaban as previously recommended.  Holding or discontinuing treatment temporarily for any invasive procedure should be handled by the excepted protocol.  Copies of his lab work and gene mutation studies were  given to him for his review.  Questions were answered to their satisfaction.  He was advised to call us in the interim should any new or untoward problems arise.  The  total time spent discussing the previous laboratory studies, gene mutation assays, there implications and significance, the natural history and behavior of myeloproliferative disease/polycythemia vera, and discussion was 40 minutes. At least 50% of that time was spent in face-to-face discussion, counseling, and answering questions. All questions were answered to his satisfaction.  He was allotted ample time for both questions and answers.  This note was dictated using voice activated technology/software.  Unfortunately, typographical errors are not uncommon, and transcription is subject to mistakes and regrettably misinterpretation.  If necessary, clarification of the above information can be discussed with me at any time.  FOLLOW UP: AS DIRECTED   cc:         [name], MD               Tharon Aquas Trigt MD   Henreitta Leber, MD  Hematology/Oncology [facility] [address] [place], [place] [address] Office: [phone] TWKM: [phone]

## 2017-12-03 NOTE — Patient Instructions (Signed)
The results of your laboratory studies were reviewed and discussed in detail.  Your red and white cells are increased.  Likewise your platelet count is increased.  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  The risk involved with this underlying "myeloproliferative disease" which we call "polycythemia vera rubra" is that it predisposes you to thrombosis/clotting, and in some cases abnormal bleeding.  A prescription for hydroxyurea was sent to your pharmacy.  Start hydroxyurea: 500 mg twice daily until her next visit.  Literature was given regarding hydroxyurea and polycythemia vera.  A follow-up visit has been scheduled for December 3.  Laboratory studies will be obtained at that time with adjustment in hydroxyurea as necessary.  Until you are certain about your upcoming procedure, start aspirin: 81 mg once daily.  Please do not hesitate to call should any new or untoward problems arise.  Have a very blessed and happy Thanksgiving! Justin Ridgel, MD Hematology/Oncology   Polycythemia Vera Polycythemia vera (PV), or myeloproliferative disease, is a form of blood cancer in which the bone marrow makes too many (overproduces) red blood cells. The bone marrow may also make too many clotting cells (platelets) and white blood cells. Bone marrow is the spongy center of bones where blood cells are produced. Sometimes, there may be an overproduction of blood cells in the liver and spleen, causing those organs to become enlarged. Additionally, people who have PV are at a higher risk for stroke or heart attack because their blood may clot more easily. PV is a long-term disease. What are the causes? Almost all people who have PV have an abnormal gene (genetic mutation) that causes changes in the way that the bone marrow makes blood cells. This gene, which is called JAK2, is not passed along from parent to child (is not hereditary). It is not  known what triggers the genetic mutation that causes the body to produce too many red blood cells. What increases the risk? This condition is more likely to develop in:  Males.  People who are 54 years of age or older.  What are the signs or symptoms? You may not have any symptoms in the early stage of PV. When symptoms develop, they may include:  Shortness of breath.  Dizziness.  Hot and flushed skin.  Itchy skin.  Sweats, especially night sweats.  Headache.  Tiredness.  Ringing in the ears.  Blurred vision or blind spots.  Bone pain.  Weight loss.  Fever.  Blood-tinged vomit or bowel movements.  How is this diagnosed? This condition may be diagnosed during a routine physical exam if you have a blood test called a complete blood count (CBC). Your health care provider also may suspect PV if you have symptoms. During the physical exam, your provider may find that you have an enlarged liver or spleen. You may also have tests to confirm the diagnosis. These may include:  A procedure to remove a sample of bone marrow for testing (bone marrow biopsy).  Blood tests to check for: ? The JAK2 gene. ? Low levels of a hormone that helps to regulate blood production (erythropoietin).  How is this treated? There is no cure for PV, but treatment can help to control the disease. There are several types of treatment. No single treatment works for everyone. You will need to work with a blood cancer specialist (hematologist) to find the treatment that is best for you. Options include:  Periodically having some blood  removed with a needle (drawn) to lower the number of red blood cells (phlebotomy).  Medicine. Your health care provider may recommend: ? Low-dose aspirin to lower your risk for blood clots. ? A medicine to reduce red blood cell production (hydroxyurea). ? A medicine to lower the number of red blood cells (interferon). ? A medicine that slows down the effects of JAK2  (ruxolitinib).  Follow these instructions at home:  Take over-the-counter and prescription medicines only as told by your health care provider.  Return to your normal activities as told by your health care provider. Ask your health care provider what activities are safe for you.  Do not use tobacco products, including cigarettes, chewing tobacco, or e-cigarettes. If you need help quitting, ask your health care provider.  Keep all follow-up visits as told by your health care provider. This is important. Contact a health care provider if:  You have side effects from your medicines.  Your symptoms change or get worse at home.  You have blood in your stool or you vomit blood. Get help right away if:  You have sudden and severe pain in your abdomen.  You have chest pain or difficulty breathing.  You have signs of stroke, such as: ? Sudden numbness. ? Weakness of your face or arm. ? Confusion. ? Difficulty speaking or understanding speech. These symptoms may represent a serious problem that is an emergency. Do not wait to see if the symptoms will go away. Get medical help right away. Call your local emergency services (911 in the [place] Do not drive yourself to the hospital. This information is not intended to replace advice given to you by your health care provider. Make sure you discuss any questions you have with your health care provider. Document Released: 09/30/2000 Document Revised: 06/13/2015 Document Reviewed: 07/18/2014 Elsevier Interactive Patient Education  2018 [employer].  Hydroxyurea capsules What is this medicine? HYDROXYUREA (hye drox ee yoor EE a) is a chemotherapy drug. This medicine is used to treat certain types of leukemias and head and neck cancer. It is also used to control the painful crises of sickle cell anemia. This medicine may be used for other purposes; ask your health care provider or pharmacist if you have questions. COMMON BRAND NAME(S): Droxia,  Hydrea What should I tell my health care provider before I take this medicine? They need to know if you have any of these conditions: -gout or high levels of uric acid in the blood -HIV or AIDS -kidney disease or on hemodialysis -leg wounds or ulcers -low blood counts, like low white cell, platelet, or red cell counts -prior or current interferon therapy -recent or ongoing radiation therapy -scheduled to receive a vaccine -an unusual or allergic reaction to hydroxyurea, other medicines, foods, dyes, or preservatives -pregnant or trying to get pregnant -breast-feeding How should I use this medicine? Take this medicine by mouth with a glass of water. Follow the directions on the prescription label. Take your medicine at regular intervals. Do not take it more often than directed. Do not stop taking except on your doctor's advice. People who are not taking this medicine should not be exposed to it. Wash your hands before and after handling your bottle or medicine. Caregivers should wear disposable gloves if they must touch the bottle or medicine. Clean up any medicine powder that spills with a damp disposable towel and throw the towel away in a closed container, such as a plastic bag. A special MedGuide will be given to you  by the pharmacist with each prescription and refill of Droxyia. Be sure to read this information carefully each time. Talk to your pediatrician regarding the use of this medicine in children. Special care may be needed. Overdosage: If you think you have taken too much of this medicine contact a poison control center or emergency room at once. NOTE: This medicine is only for you. Do not share this medicine with others. What if I miss a dose? If you miss a dose, take it as soon as you can. If it is almost time for your next dose, take only that dose. Do not take double or extra doses. What may interact with this medicine? This medicine may also interact with the following  medications: -didanosine -stavudine -live virus vaccines This list may not describe all possible interactions. Give your health care provider a list of all the medicines, herbs, non-prescription drugs, or dietary supplements you use. Also tell them if you smoke, drink alcohol, or use illegal drugs. Some items may interact with your medicine. What should I watch for while using this medicine? This drug may make you feel generally unwell. This is not uncommon, as chemotherapy can affect healthy cells as well as cancer cells. Report any side effects. Continue your course of treatment even though you feel ill unless your doctor tells you to stop. You will receive regular blood tests during your treatment. Call your doctor or health care professional for advice if you get a fever, chills or sore throat, or other symptoms of a cold or flu. Do not treat yourself. This drug decreases your body's ability to fight infections. Try to avoid being around people who are sick. This medicine may increase your risk to bruise or bleed. Call your doctor or health care professional if you notice any unusual bleeding. Talk to your doctor about your risk of cancer. You may be more at risk for certain types of cancers if you take this medicine. Keep out of the sun. If you cannot avoid being in the sun, wear protective clothing and use sunscreen. Do not use sun lamps or tanning beds/booths. Do not become pregnant while taking this medicine or for at least 6 months after stopping it. Women should inform their doctor if they wish to become pregnant or think they might be pregnant. Men should not father a child while taking this medicine and for at least a year after stopping it. There is a potential for serious side effects to an unborn child. Talk to your health care professional or pharmacist for more information. Do not breast-feed an infant while taking this medicine. This may interfere with the ability to have or father a  child. You should talk with your doctor or health care professional if you are concerned about your fertility. What side effects may I notice from receiving this medicine? Side effects that you should report to your doctor or health care professional as soon as possible: -allergic reactions like skin rash, itching or hives, swelling of the face, lips, or tongue -breathing problems -burning, redness or pain at the site of any radiation therapy -low blood counts - this medicine may decrease the number of white blood cells, red blood cells and platelets. You may be at increased risk for infections and bleeding. -signs of decreased platelets or bleeding - bruising, pinpoint red spots on the skin, black, tarry stools, blood in the urine -signs of decreased red blood cells - unusually weak or tired, fainting spells, lightheadedness -signs of infection -  fever or chills, cough, sore throat, pain or difficulty passing urine -signs and symptoms of bleeding such as bloody or black, tarry stools; red or dark-brown urine; spitting up blood or brown material that looks like coffee grounds; red spots on the skin; unusual bruising or bleeding from the eye, gums, or nose -skin ulcers Side effects that usually do not require medical attention (report to your doctor or health care professional if they continue or are bothersome): -constipation -diarrhea -loss of appetite -mouth sores -nausea This list may not describe all possible side effects. Call your doctor for medical advice about side effects. You may report side effects to FDA at 1-800-FDA-1088. Where should I keep my medicine? Keep out of the reach of children. See product for storage instructions. Each product may have different instructions. Keep tightly closed. Throw away any unused medicine after the expiration date. NOTE: This sheet is a summary. It may not cover all possible information. If you have questions about this medicine, talk to your  doctor, pharmacist, or health care provider.  2018 Elsevier/[employer] Standard (2016-01-09 11:43:13)

## 2017-12-03 NOTE — Telephone Encounter (Signed)
Printed avs and calender of upcoming appointment. Per 11/15 los 

## 2017-12-07 ENCOUNTER — Encounter: Payer: Self-pay | Admitting: Cardiology

## 2017-12-07 ENCOUNTER — Ambulatory Visit: Payer: BLUE CROSS/BLUE SHIELD | Admitting: Cardiology

## 2017-12-07 VITALS — BP 114/66 | HR 87 | Ht 68.0 in | Wt 200.0 lb

## 2017-12-07 DIAGNOSIS — Z7189 Other specified counseling: Secondary | ICD-10-CM

## 2017-12-07 DIAGNOSIS — I739 Peripheral vascular disease, unspecified: Secondary | ICD-10-CM

## 2017-12-07 DIAGNOSIS — Z716 Tobacco abuse counseling: Secondary | ICD-10-CM

## 2017-12-07 DIAGNOSIS — G4730 Sleep apnea, unspecified: Secondary | ICD-10-CM

## 2017-12-07 DIAGNOSIS — Q231 Congenital insufficiency of aortic valve: Secondary | ICD-10-CM

## 2017-12-07 DIAGNOSIS — I4891 Unspecified atrial fibrillation: Secondary | ICD-10-CM

## 2017-12-07 DIAGNOSIS — I519 Heart disease, unspecified: Secondary | ICD-10-CM

## 2017-12-07 DIAGNOSIS — I10 Essential (primary) hypertension: Secondary | ICD-10-CM

## 2017-12-07 NOTE — H&P (View-Only) (Signed)
Cardiology Office Note:    Date:  12/07/2017   ID:  Justin [name], DOB 06-26-1955, MRN [id number]  PCP:  [name], MD  Cardiologist:  Buford Dresser, MD PhD  Referring MD: [name], MD   CC: atrial fibrillation  History of Present Illness:    Justin [name] is a 62 y.o. male with a hx of obesity, diabetes, tobacco use, hypertension, peripheral vascular disease who is seen as a new consult at the request of [name], MD for the evaluation and management of atrial fibrillation. He was seen by Dr. Prescott Gum on 12/01/17 for bicuspid aortic valve stenosis.  Patient's concerns: Recently went for an annual physical. Was found that his potassium was very elevated and his white blood cells were very high. Has since been seen by Dr. Audelia Hives and diagnosed with myeloproliferative neoplasm/polycythemia vera. He has been started on hydroxyurea and recommended for aspirin. However, with the recent start of anticoagulation for atrial fibrillation, he has not yet started aspirin.  He has had a murmur for many years. Recent echo showed mild-moderate aortic regurgitation, mild aortic stenosis, bicuspid valve, recent drop in EF from 60 to 45%. He has a R/LHC and TEE scheduled with Dr. Haroldine Laws on 12/24/17 for further evaluation.  Feels excessively tired/lethargic lately. He isn't sure if that is the afib or the myeloproliferative neoplasm/polycythemia vera. Also has stress at home, 21 year old mother in law with multiple issues (parkinson's, Alzheimers'). He does note some claudication with ambulation, but no rest pain or poorly healing wounds.  He has significant sleep issues. His job requires him to work irregular shifts and long hours, and he needs to take Ambien to sleep. His sleep quality is poor, and he is fatigued throughout the day. He does snore, and his wife thinks he occasionally stops breathing. He falls asleep easily during the day during quiet activities. He has  risk factors of high blood pressure, afib, and diabetes.  Lost 45 lbs. Has a sensitive sense of smell, lost his appetite. Rare alcohol now, about 1x/week.  No cigarettes, on 3 cigars/day.  FH: he is adopted, does not know his family history.   Denies chest pain, SOB, PND, orthopnea, syncope, LE edema. Cannot really tell that he is in afib.  Past Medical History:  Diagnosis Date  . Acute meniscal tear of knee LEFT  . Aortic stenosis 12/01/2017   NONRHEUMATIC, AORTIC VALVE CALCIFICATIONS, MILD TO MODERATE REGURG, MILD TO MODERATE CALCIFIED ANNULUS per ECHO 10/25/17 @ [facility] [place]  . Arthritis   . Atrial fibrillation ([place]) 11/12/2017   AT [facility]/V WITH PCP  . Heart murmur MILD-- ASYMPTOMATIC  . Hyperlipidemia   . Hypertension   . Left knee pain     Past Surgical History:  Procedure Laterality Date  . APPENDECTOMY  1998  . CATARACT EXTRACTION W/ INTRAOCULAR LENS  IMPLANT, BILATERAL  1998/  2000  . CERVICAL FUSION  1985   C4 - 5  . KNEE ARTHROSCOPY W/ MENISCECTOMY  1991   LEFT KNEE  . NASAL SINUS SURGERY  1982  . ROTATOR CUFF REPAIR  09-04-2005   LEFT SHOULDER  . TONSILLECTOMY  AGE 59    Current Medications: Current Outpatient Medications on File Prior to Visit  Medication Sig  . apixaban (ELIQUIS) 5 MG TABS tablet Take 5 mg by mouth 2 (two) times daily.  Marland Kitchen aspirin 81 MG tablet Take 81 mg by mouth as needed for pain.  Marland Kitchen atenolol (TENORMIN) 50 MG tablet Take 50 mg  by mouth daily.  . Cholecalciferol 1.25 MG (50000 UT) TABS Take by mouth.  . cyclobenzaprine (FLEXERIL) 10 MG tablet Take 10 mg by mouth 3 (three) times daily as needed.  . fish oil-omega-3 fatty acids 1000 MG capsule Take 1 g by mouth 2 (two) times daily.  . fluticasone (FLONASE) 50 MCG/ACT nasal spray Place 2 sprays into both nostrils daily.  Marland Kitchen HYDROcodone-acetaminophen (NORCO/VICODIN) 5-325 MG per tablet Take 1 tablet by mouth every 6 (six) hours as needed for moderate pain.  . hydroxyurea (HYDREA) 500 MG  capsule Take 1 capsule (500 mg total) by mouth 2 (two) times daily. May take with food to minimize GI side effects.  Marland Kitchen loratadine (CLARITIN) 10 MG tablet Take 10 mg by mouth daily.  . meloxicam (MOBIC) 15 MG tablet Take 15 mg by mouth 2 (two) times daily.  . Multiple Vitamin (MULTIVITAMIN) tablet Take 1 tablet by mouth daily.  . nefazodone (SERZONE) 100 MG tablet Take 100 mg by mouth 2 (two) times daily.  . pregabalin (LYRICA) 50 MG capsule Take 50 mg by mouth 3 (three) times daily.  . simvastatin (ZOCOR) 40 MG tablet Take 40 mg by mouth every evening.  . zolpidem (AMBIEN) 10 MG tablet Take 10 mg by mouth at bedtime as needed.   No current facility-administered medications on file prior to visit.      Allergies:   Codeine   Social History   Socioeconomic History  . Marital status: Married    Spouse name: Not on file  . Number of children: Not on file  . Years of education: Not on file  . Highest education level: Not on file  Occupational History  . Not on file  Social Needs  . Financial resource strain: Not on file  . Food insecurity:    Worry: Not on file    Inability: Not on file  . Transportation needs:    Medical: Not on file    Non-medical: Not on file  Tobacco Use  . Smoking status: Current Some Day Smoker    Packs/day: 0.25    Years: 23.00    Pack years: 5.75    Types: Cigars  . Smokeless tobacco: Never Used  . Tobacco comment: quit cigs in 2011 and smokes cigars daily- from 3-10 cigars-09/27/13  Substance and Sexual Activity  . Alcohol use: Yes    Comment: OCCASIONAL  . Drug use: No  . Sexual activity: Not on file  Lifestyle  . Physical activity:    Days per week: Not on file    Minutes per session: Not on file  . Stress: Not on file  Relationships  . Social connections:    Talks on phone: Not on file    Gets together: Not on file    Attends religious service: Not on file    Active member of club or organization: Not on file    Attends meetings of clubs  or organizations: Not on file    Relationship status: Not on file  Other Topics Concern  . Not on file  Social History Narrative  . Not on file     Family History: Family history unknown as he was adopted.  ROS:   Please see the history of present illness.  Additional pertinent ROS:  Constitutional: Negative for chills, fever, night sweats, unintentional weight loss. Positive for fatigue HENT: Negative for ear pain and hearing loss.   Eyes: Negative for loss of vision and eye pain.  Respiratory: Negative for cough, sputum, shortness  of breath, wheezing.   Cardiovascular: Negative for chest pain, palpitations, PND, orthopnea, lower extremity edema and claudication.  Gastrointestinal: Negative for abdominal pain, melena, and hematochezia.  Genitourinary: Negative for dysuria and hematuria.  Musculoskeletal: Negative for falls and myalgias.  Skin: Negative for itching and rash.  Neurological: Negative for focal weakness, focal sensory changes and loss of consciousness.  Endo/Heme/Allergies: Does not bruise/bleed easily.   EKGs/Labs/Other Studies Reviewed:    The following studies were reviewed today: Echo 10/25/17 - Left ventricle: Wall thickness was increased in a pattern of mild   LVH. Systolic function was mildly to moderately reduced. The   estimated ejection fraction was in the range of 40% to 45%. - Aortic valve: Mildly to moderately calcified annulus. Mildly   thickened, moderately calcified leaflets. There was mild to   moderate regurgitation. Valve area (VTI): 0.93 cm^2. Valve area   (Vmax): 0.93 cm^2. Valve area (Vmean): 0.9 cm^2. - Left atrium: The atrium was mildly dilated. - Right atrium: The atrium was mildly dilated.  EKG:  EKG is personally reviewed.  The ekg ordered today demonstrates atrial fibrillation  Recent Labs: 11/19/2017: ALT 21; BUN 12; Creatinine, Ser 0.85; Hemoglobin 16.8; Platelet Count 526; Potassium 4.4; Sodium 139  Recent Lipid Panel No results  found for: CHOL, TRIG, HDL, CHOLHDL, VLDL, LDLCALC, LDLDIRECT  Physical Exam:    VS:  BP 114/66   Pulse 87   Ht 5\' 8"  (1.727 m)   Wt 200 lb (90.7 kg)   BMI 30.41 kg/m     Wt Readings from Last 3 Encounters:  12/07/17 200 lb (90.7 kg)  12/03/17 202 lb 14.4 oz (92 kg)  12/01/17 196 lb (88.9 kg)     GEN: Well nourished, well developed in no acute distress HEENT: Normal NECK: No JVD; No carotid bruits LYMPHATICS: No lymphadenopathy CARDIAC: irregular rhythm, normal S1 and S2, no rubs, gallops. 3/6 early to mid peaking SEM with preserved S2. Radial pulses 2+ bilaterally. Unable to palpate LE pulses bilaterally, but they are warm RESPIRATORY:  Clear to auscultation without rales, wheezing or rhonchi  ABDOMEN: Soft, non-tender, non-distended MUSCULOSKELETAL:  No edema; No deformity  SKIN: Warm and dry NEUROLOGIC:  Alert and oriented x 3 PSYCHIATRIC:  Normal affect   ASSESSMENT:    1. Atrial fibrillation, unspecified type ([place])   2. Sleep apnea, unspecified type   3. Claudication ([place])   4. Tobacco abuse counseling   5. Essential hypertension   6. Counseling on health promotion and disease prevention   7. Bicuspid aortic valve determined by imaging   8. Left ventricular systolic dysfunction without heart failure    PLAN:    1. Atrial fibrillation: rate controlled, does not notice palpitations but does feel fatigued. Chadsvasc on my calculation=2 now, likely 3 with PAD. His polycythemia also places him at higher risk as well.  -Continue rate control with atenolol, anticoagulation with apixaban   2. Likely sleep apnea: Epworth sleepiness score=12. High likelihood of sleep apnea given body habitus and comorbidities.   -referred for sleep study  3. Claudication:  -counseled on tobacco cessation (below). Will get ABIs. Encouraged exercise. Based on cath results, likely would also start aspirin.  4. Tobacco abuse with counseling given: The patient was counseled on tobacco  cessation today for 5 minutes.  Counseling included reviewing the risks of smoking tobacco products, how it impacts the patient's current medical diagnoses and different strategies for quitting.  Pharmacotherapy to aid in tobacco cessation was not prescribed today.  5. Hypertension: at  goal today. On atenolol only. Would consider adding ACEI given diabetes and vascular disease if BP rises.  6. Bicuspid aortic valve: per most recent echo, mild stenosis, mild-moderate AR. Has not had dedicated imaging of thoracic aorta.  7. Decrease in EF: Went from 60% to 45%. Question if this is afib vs. CAD given his risk factors -pending R/LHC 12/6 -he should be given instructions on when to hold his apixaban prior to cath. If he does not receive these, I asked him to contact me  8. Prevention -recommend heart healthy/Mediterranean diet, with whole grains, fruits, vegetable, fish, lean meats, nuts, and olive oil. Limit salt. -recommend moderate walking, 3-5 times/week for 30-50 minutes each session. Aim for at least 150 minutes.week. Goal should be pace of 3 miles/hours, or walking 1.5 miles in 30 minutes -recommend avoidance of tobacco products. Avoid excess alcohol. -Additional risk factor control:  -Diabetes: A1c is 6.5. Consider SGLT2 inhibitor at next visit  -Lipids: From [facility], HDL 29, LDL 52, Tchol 111, TG 151  -Blood pressure control: as above  -Weight: BMI 30. Currently losing weight -ASCVD risk score: does not calculate given Tchol <130 and HDL<70, but using those as variables, his 10 year risk is 27.5%. This suggests that even with low LDL, he is likely still at elevated risk.  Plan for follow up: 2 mos  TIME SPENT WITH PATIENT: >60 minutes of direct patient care. More than 50% of that time was spent on coordination of care and counseling regarding multiple comorbidities and symptoms, planning evaluation of several cardiovascular risk factors/symptoms.  Medication Adjustments/Labs and Tests  Ordered: Current medicines are reviewed at length with the patient today.  Concerns regarding medicines are outlined above.  Orders Placed This Encounter  Procedures  . EKG 12-Lead  . Split night study   No orders of the defined types were placed in this encounter.   Patient Instructions  Medication Instructions:  Your Physician recommend you continue on your current medication as directed.    If you need a refill on your cardiac medications before your next appointment, please call your pharmacy.   Lab work: None  Testing/Procedures: Your physician has requested that you have an ankle brachial index (ABI). During this test an ultrasound and blood pressure cuff are used to evaluate the arteries that supply the arms and legs with blood. Allow thirty minutes for this exam. There are no restrictions or special instructions. [place]  Your physician has recommended that you have a sleep study. This test records several body functions during sleep, including: brain activity, eye movement, oxygen and carbon dioxide blood levels, heart rate and rhythm, breathing rate and rhythm, the flow of air through your mouth and nose, snoring, body muscle movements, and chest and belly movement. [facility]    Follow-Up: At [facility], you and your health needs are our priority.  As part of our continuing mission to provide you with exceptional heart care, we have created designated Provider Care Teams.  These Care Teams include your primary Cardiologist (physician) and Advanced Practice Providers (APPs -  Physician Assistants and Nurse Practitioners) who all work together to provide you with the care you need, when you need it. You will need a follow up appointment in 2 months.  Please call our office 2 months in advance to schedule this appointment.  You may see Dr. Harrell Gave or one of the following Advanced Practice Providers on your designated Care Team:   Rosaria Ferries, PA-C .  Jory Sims, DNP, ANP  Any Other Special Instructions Will Be Listed Below (If Applicable).       Signed, Buford Dresser, MD PhD 12/07/2017 2:43 PM    [place]

## 2017-12-07 NOTE — Patient Instructions (Signed)
Medication Instructions:  Your Physician recommend you continue on your current medication as directed.    If you need a refill on your cardiac medications before your next appointment, please call your pharmacy.   Lab work: None  Testing/Procedures: Your physician has requested that you have an ankle brachial index (ABI). During this test an ultrasound and blood pressure cuff are used to evaluate the arteries that supply the arms and legs with blood. Allow thirty minutes for this exam. There are no restrictions or special instructions. [place]  Your physician has recommended that you have a sleep study. This test records several body functions during sleep, including: brain activity, eye movement, oxygen and carbon dioxide blood levels, heart rate and rhythm, breathing rate and rhythm, the flow of air through your mouth and nose, snoring, body muscle movements, and chest and belly movement. [facility]    Follow-Up: At [facility], you and your health needs are our priority.  As part of our continuing mission to provide you with exceptional heart care, we have created designated Provider Care Teams.  These Care Teams include your primary Cardiologist (physician) and Advanced Practice Providers (APPs -  Physician Assistants and Nurse Practitioners) who all work together to provide you with the care you need, when you need it. You will need a follow up appointment in 2 months.  Please call our office 2 months in advance to schedule this appointment.  You may see Dr. Harrell Gave or one of the following Advanced Practice Providers on your designated Care Team:   Rosaria Ferries, PA-C . Jory Sims, DNP, ANP  Any Other Special Instructions Will Be Listed Below (If Applicable).

## 2017-12-07 NOTE — Progress Notes (Signed)
Cardiology Office Note:    Date:  12/07/2017   ID:  Justin [name], DOB [birth year], MRN [id number]  PCP:  [name], MD  Cardiologist:  Buford Dresser, MD PhD  Referring MD: [name], MD   CC: atrial fibrillation  History of Present Illness:    Justin [name] is a 62 y.o. male with a hx of obesity, diabetes, tobacco use, hypertension, peripheral vascular disease who is seen as a new consult at the request of [name], MD for the evaluation and management of atrial fibrillation. He was seen by Dr. Prescott Gum on 12/01/17 for bicuspid aortic valve stenosis.  Patient's concerns: Recently went for an annual physical. Was found that his potassium was very elevated and his white blood cells were very high. Has since been seen by Dr. Audelia Hives and diagnosed with myeloproliferative neoplasm/polycythemia vera. He has been started on hydroxyurea and recommended for aspirin. However, with the recent start of anticoagulation for atrial fibrillation, he has not yet started aspirin.  He has had a murmur for many years. Recent echo showed mild-moderate aortic regurgitation, mild aortic stenosis, bicuspid valve, recent drop in EF from 60 to 45%. He has a R/LHC and TEE scheduled with Dr. Haroldine Laws on 12/24/17 for further evaluation.  Feels excessively tired/lethargic lately. He isn't sure if that is the afib or the myeloproliferative neoplasm/polycythemia vera. Also has stress at home, 72 year old mother in law with multiple issues (parkinson's, Alzheimers'). He does note some claudication with ambulation, but no rest pain or poorly healing wounds.  He has significant sleep issues. His job requires him to work irregular shifts and long hours, and he needs to take Ambien to sleep. His sleep quality is poor, and he is fatigued throughout the day. He does snore, and his wife thinks he occasionally stops breathing. He falls asleep easily during the day during quiet activities. He has  risk factors of high blood pressure, afib, and diabetes.  Lost 45 lbs. Has a sensitive sense of smell, lost his appetite. Rare alcohol now, about 1x/week.  No cigarettes, on 3 cigars/day.  FH: he is adopted, does not know his family history.   Denies chest pain, SOB, PND, orthopnea, syncope, LE edema. Cannot really tell that he is in afib.  Past Medical History:  Diagnosis Date  . Acute meniscal tear of knee LEFT  . Aortic stenosis 12/01/2017   NONRHEUMATIC, AORTIC VALVE CALCIFICATIONS, MILD TO MODERATE REGURG, MILD TO MODERATE CALCIFIED ANNULUS per ECHO 10/25/17 @ [facility] [place]  . Arthritis   . Atrial fibrillation ([place]) 11/12/2017   AT O/V WITH PCP  . Heart murmur MILD-- ASYMPTOMATIC  . Hyperlipidemia   . Hypertension   . Left knee pain     Past Surgical History:  Procedure Laterality Date  . APPENDECTOMY  1998  . CATARACT EXTRACTION W/ INTRAOCULAR LENS  IMPLANT, BILATERAL  1998/  2000  . CERVICAL FUSION  1985   C4 - 5  . KNEE ARTHROSCOPY W/ MENISCECTOMY  1991   LEFT KNEE  . NASAL SINUS SURGERY  1982  . ROTATOR CUFF REPAIR  09-04-2005   LEFT SHOULDER  . TONSILLECTOMY  AGE 37    Current Medications: Current Outpatient Medications on File Prior to Visit  Medication Sig  . apixaban (ELIQUIS) 5 MG TABS tablet Take 5 mg by mouth 2 (two) times daily.  Marland Kitchen aspirin 81 MG tablet Take 81 mg by mouth as needed for pain.  Marland Kitchen atenolol (TENORMIN) 50 MG tablet Take 50 mg  by mouth daily.  . Cholecalciferol 1.25 MG (50000 UT) TABS Take by mouth.  . cyclobenzaprine (FLEXERIL) 10 MG tablet Take 10 mg by mouth 3 (three) times daily as needed.  . fish oil-omega-3 fatty acids 1000 MG capsule Take 1 g by mouth 2 (two) times daily.  . fluticasone (FLONASE) 50 MCG/ACT nasal spray Place 2 sprays into both nostrils daily.  Marland Kitchen HYDROcodone-acetaminophen (NORCO/VICODIN) 5-325 MG per tablet Take 1 tablet by mouth every 6 (six) hours as needed for moderate pain.  . hydroxyurea (HYDREA) 500 MG  capsule Take 1 capsule (500 mg total) by mouth 2 (two) times daily. May take with food to minimize GI side effects.  Marland Kitchen loratadine (CLARITIN) 10 MG tablet Take 10 mg by mouth daily.  . meloxicam (MOBIC) 15 MG tablet Take 15 mg by mouth 2 (two) times daily.  . Multiple Vitamin (MULTIVITAMIN) tablet Take 1 tablet by mouth daily.  . nefazodone (SERZONE) 100 MG tablet Take 100 mg by mouth 2 (two) times daily.  . pregabalin (LYRICA) 50 MG capsule Take 50 mg by mouth 3 (three) times daily.  . simvastatin (ZOCOR) 40 MG tablet Take 40 mg by mouth every evening.  . zolpidem (AMBIEN) 10 MG tablet Take 10 mg by mouth at bedtime as needed.   No current facility-administered medications on file prior to visit.      Allergies:   Codeine   Social History   Socioeconomic History  . Marital status: Married    Spouse name: Not on file  . Number of children: Not on file  . Years of education: Not on file  . Highest education level: Not on file  Occupational History  . Not on file  Social Needs  . Financial resource strain: Not on file  . Food insecurity:    Worry: Not on file    Inability: Not on file  . Transportation needs:    Medical: Not on file    Non-medical: Not on file  Tobacco Use  . Smoking status: Current Some Day Smoker    Packs/day: 0.25    Years: 23.00    Pack years: 5.75    Types: Cigars  . Smokeless tobacco: Never Used  . Tobacco comment: quit cigs in 2011 and smokes cigars daily- from 3-10 cigars-09/27/13  Substance and Sexual Activity  . Alcohol use: Yes    Comment: OCCASIONAL  . Drug use: No  . Sexual activity: Not on file  Lifestyle  . Physical activity:    Days per week: Not on file    Minutes per session: Not on file  . Stress: Not on file  Relationships  . Social connections:    Talks on phone: Not on file    Gets together: Not on file    Attends religious service: Not on file    Active member of club or organization: Not on file    Attends meetings of clubs  or organizations: Not on file    Relationship status: Not on file  Other Topics Concern  . Not on file  Social History Narrative  . Not on file     Family History: Family history unknown as he was adopted.  ROS:   Please see the history of present illness.  Additional pertinent ROS:  Constitutional: Negative for chills, fever, night sweats, unintentional weight loss. Positive for fatigue HENT: Negative for ear pain and hearing loss.   Eyes: Negative for loss of vision and eye pain.  Respiratory: Negative for cough, sputum, shortness  of breath, wheezing.   Cardiovascular: Negative for chest pain, palpitations, PND, orthopnea, lower extremity edema and claudication.  Gastrointestinal: Negative for abdominal pain, melena, and hematochezia.  Genitourinary: Negative for dysuria and hematuria.  Musculoskeletal: Negative for falls and myalgias.  Skin: Negative for itching and rash.  Neurological: Negative for focal weakness, focal sensory changes and loss of consciousness.  Endo/Heme/Allergies: Does not bruise/bleed easily.   EKGs/Labs/Other Studies Reviewed:    The following studies were reviewed today: Echo 10/25/17 - Left ventricle: Wall thickness was increased in a pattern of mild   LVH. Systolic function was mildly to moderately reduced. The   estimated ejection fraction was in the range of 40% to 45%. - Aortic valve: Mildly to moderately calcified annulus. Mildly   thickened, moderately calcified leaflets. There was mild to   moderate regurgitation. Valve area (VTI): 0.93 cm^2. Valve area   (Vmax): 0.93 cm^2. Valve area (Vmean): 0.9 cm^2. - Left atrium: The atrium was mildly dilated. - Right atrium: The atrium was mildly dilated.  EKG:  EKG is personally reviewed.  The ekg ordered today demonstrates atrial fibrillation  Recent Labs: 11/19/2017: ALT 21; BUN 12; Creatinine, Ser 0.85; Hemoglobin 16.8; Platelet Count 526; Potassium 4.4; Sodium 139  Recent Lipid Panel No results  found for: CHOL, TRIG, HDL, CHOLHDL, VLDL, LDLCALC, LDLDIRECT  Physical Exam:    VS:  BP 114/66   Pulse 87   Ht 5\' 8"  (1.727 m)   Wt 200 lb (90.7 kg)   BMI 30.41 kg/m     Wt Readings from Last 3 Encounters:  12/07/17 200 lb (90.7 kg)  12/03/17 202 lb 14.4 oz (92 kg)  12/01/17 196 lb (88.9 kg)     GEN: Well nourished, well developed in no acute distress HEENT: Normal NECK: No JVD; No carotid bruits LYMPHATICS: No lymphadenopathy CARDIAC: irregular rhythm, normal S1 and S2, no rubs, gallops. 3/6 early to mid peaking SEM with preserved S2. Radial pulses 2+ bilaterally. Unable to palpate LE pulses bilaterally, but they are warm RESPIRATORY:  Clear to auscultation without rales, wheezing or rhonchi  ABDOMEN: Soft, non-tender, non-distended MUSCULOSKELETAL:  No edema; No deformity  SKIN: Warm and dry NEUROLOGIC:  Alert and oriented x 3 PSYCHIATRIC:  Normal affect   ASSESSMENT:    1. Atrial fibrillation, unspecified type ([place])   2. Sleep apnea, unspecified type   3. Claudication ([place])   4. Tobacco abuse counseling   5. Essential hypertension   6. Counseling on health promotion and disease prevention   7. Bicuspid aortic valve determined by imaging   8. Left ventricular systolic dysfunction without heart failure    PLAN:    1. Atrial fibrillation: rate controlled, does not notice palpitations but does feel fatigued. Chadsvasc on my calculation=2 now, likely 3 with PAD. His polycythemia also places him at higher risk as well.  -Continue rate control with atenolol, anticoagulation with apixaban   2. Likely sleep apnea: Epworth sleepiness score=12. High likelihood of sleep apnea given body habitus and comorbidities.   -referred for sleep study  3. Claudication:  -counseled on tobacco cessation (below). Will get ABIs. Encouraged exercise. Based on cath results, likely would also start aspirin.  4. Tobacco abuse with counseling given: The patient was counseled on tobacco  cessation today for 5 minutes.  Counseling included reviewing the risks of smoking tobacco products, how it impacts the patient's current medical diagnoses and different strategies for quitting.  Pharmacotherapy to aid in tobacco cessation was not prescribed today.  5. Hypertension: at  goal today. On atenolol only. Would consider adding ACEI given diabetes and vascular disease if BP rises.  6. Bicuspid aortic valve: per most recent echo, mild stenosis, mild-moderate AR. Has not had dedicated imaging of thoracic aorta.  7. Decrease in EF: Went from 60% to 45%. Question if this is afib vs. CAD given his risk factors -pending R/LHC 12/6 -he should be given instructions on when to hold his apixaban prior to cath. If he does not receive these, I asked him to contact me  8. Prevention -recommend heart healthy/Mediterranean diet, with whole grains, fruits, vegetable, fish, lean meats, nuts, and olive oil. Limit salt. -recommend moderate walking, 3-5 times/week for 30-50 minutes each session. Aim for at least 150 minutes.week. Goal should be pace of 3 miles/hours, or walking 1.5 miles in 30 minutes -recommend avoidance of tobacco products. Avoid excess alcohol. -Additional risk factor control:  -Diabetes: A1c is 6.5. Consider SGLT2 inhibitor at next visit  -Lipids: From [facility], HDL 29, LDL 52, Tchol 111, TG 151  -Blood pressure control: as above  -Weight: BMI 30. Currently losing weight -ASCVD risk score: does not calculate given Tchol <130 and HDL<70, but using those as variables, his 10 year risk is 27.5%. This suggests that even with low LDL, he is likely still at elevated risk.  Plan for follow up: 2 mos  TIME SPENT WITH PATIENT: >60 minutes of direct patient care. More than 50% of that time was spent on coordination of care and counseling regarding multiple comorbidities and symptoms, planning evaluation of several cardiovascular risk factors/symptoms.  Medication Adjustments/Labs and Tests  Ordered: Current medicines are reviewed at length with the patient today.  Concerns regarding medicines are outlined above.  Orders Placed This Encounter  Procedures  . EKG 12-Lead  . Split night study   No orders of the defined types were placed in this encounter.   Patient Instructions  Medication Instructions:  Your Physician recommend you continue on your current medication as directed.    If you need a refill on your cardiac medications before your next appointment, please call your pharmacy.   Lab work: None  Testing/Procedures: Your physician has requested that you have an ankle brachial index (ABI). During this test an ultrasound and blood pressure cuff are used to evaluate the arteries that supply the arms and legs with blood. Allow thirty minutes for this exam. There are no restrictions or special instructions. [place]  Your physician has recommended that you have a sleep study. This test records several body functions during sleep, including: brain activity, eye movement, oxygen and carbon dioxide blood levels, heart rate and rhythm, breathing rate and rhythm, the flow of air through your mouth and nose, snoring, body muscle movements, and chest and belly movement. [facility]    Follow-Up: At [facility], you and your health needs are our priority.  As part of our continuing mission to provide you with exceptional heart care, we have created designated Provider Care Teams.  These Care Teams include your primary Cardiologist (physician) and Advanced Practice Providers (APPs -  Physician Assistants and Nurse Practitioners) who all work together to provide you with the care you need, when you need it. You will need a follow up appointment in 2 months.  Please call our office 2 months in advance to schedule this appointment.  You may see Dr. Harrell Gave or one of the following Advanced Practice Providers on your designated Care Team:   Rosaria Ferries, PA-C .  Jory Sims, DNP, ANP  Any Other Special Instructions Will Be Listed Below (If Applicable).       Signed, Buford Dresser, MD PhD 12/07/2017 2:43 PM    [place]

## 2017-12-09 ENCOUNTER — Encounter: Payer: Self-pay | Admitting: Cardiology

## 2017-12-09 ENCOUNTER — Telehealth: Payer: Self-pay | Admitting: *Deleted

## 2017-12-09 DIAGNOSIS — I739 Peripheral vascular disease, unspecified: Secondary | ICD-10-CM | POA: Insufficient documentation

## 2017-12-09 DIAGNOSIS — Q231 Congenital insufficiency of aortic valve: Secondary | ICD-10-CM | POA: Insufficient documentation

## 2017-12-09 DIAGNOSIS — I519 Heart disease, unspecified: Secondary | ICD-10-CM | POA: Insufficient documentation

## 2017-12-09 DIAGNOSIS — G4730 Sleep apnea, unspecified: Secondary | ICD-10-CM | POA: Insufficient documentation

## 2017-12-09 NOTE — Telephone Encounter (Signed)
Patient's wife notified of sleep study appointment for 01/19/18 @ [facility]. Per Malen Gauze @ [place] states no PA is required for CPT   Code 37096 or 43838. Call reference # [id number].

## 2017-12-10 ENCOUNTER — Other Ambulatory Visit: Payer: Self-pay | Admitting: Cardiology

## 2017-12-10 DIAGNOSIS — I739 Peripheral vascular disease, unspecified: Secondary | ICD-10-CM

## 2017-12-13 ENCOUNTER — Ambulatory Visit (HOSPITAL_COMMUNITY)
Admission: RE | Admit: 2017-12-13 | Discharge: 2017-12-13 | Disposition: A | Discharge status: Home / Self Care | Payer: BLUE CROSS/BLUE SHIELD | Source: Ambulatory Visit | Attending: Cardiovascular Disease | Admitting: Cardiovascular Disease

## 2017-12-13 DIAGNOSIS — I739 Peripheral vascular disease, unspecified: Secondary | ICD-10-CM | POA: Diagnosis not present

## 2017-12-18 DIAGNOSIS — L03012 Cellulitis of left finger: Secondary | ICD-10-CM | POA: Diagnosis not present

## 2017-12-19 ENCOUNTER — Other Ambulatory Visit: Payer: Self-pay | Admitting: Hematology and Oncology

## 2017-12-19 DIAGNOSIS — D45 Polycythemia vera: Secondary | ICD-10-CM

## 2017-12-19 DIAGNOSIS — Z5111 Encounter for antineoplastic chemotherapy: Secondary | ICD-10-CM

## 2017-12-19 DIAGNOSIS — D471 Chronic myeloproliferative disease: Secondary | ICD-10-CM

## 2017-12-21 ENCOUNTER — Encounter: Payer: Self-pay | Admitting: Hematology and Oncology

## 2017-12-21 ENCOUNTER — Telehealth: Payer: Self-pay

## 2017-12-21 ENCOUNTER — Inpatient Hospital Stay: Payer: BLUE CROSS/BLUE SHIELD

## 2017-12-21 ENCOUNTER — Inpatient Hospital Stay: Payer: BLUE CROSS/BLUE SHIELD | Attending: Hematology and Oncology | Admitting: Hematology and Oncology

## 2017-12-21 DIAGNOSIS — E119 Type 2 diabetes mellitus without complications: Secondary | ICD-10-CM | POA: Diagnosis not present

## 2017-12-21 DIAGNOSIS — R3911 Hesitancy of micturition: Secondary | ICD-10-CM | POA: Diagnosis not present

## 2017-12-21 DIAGNOSIS — E1142 Type 2 diabetes mellitus with diabetic polyneuropathy: Secondary | ICD-10-CM | POA: Diagnosis not present

## 2017-12-21 DIAGNOSIS — Z5111 Encounter for antineoplastic chemotherapy: Secondary | ICD-10-CM

## 2017-12-21 DIAGNOSIS — Z7901 Long term (current) use of anticoagulants: Secondary | ICD-10-CM | POA: Diagnosis not present

## 2017-12-21 DIAGNOSIS — I350 Nonrheumatic aortic (valve) stenosis: Secondary | ICD-10-CM | POA: Diagnosis not present

## 2017-12-21 DIAGNOSIS — D45 Polycythemia vera: Secondary | ICD-10-CM | POA: Insufficient documentation

## 2017-12-21 DIAGNOSIS — Z79899 Other long term (current) drug therapy: Secondary | ICD-10-CM | POA: Diagnosis not present

## 2017-12-21 DIAGNOSIS — I4891 Unspecified atrial fibrillation: Secondary | ICD-10-CM | POA: Diagnosis not present

## 2017-12-21 DIAGNOSIS — I10 Essential (primary) hypertension: Secondary | ICD-10-CM | POA: Diagnosis not present

## 2017-12-21 DIAGNOSIS — E1151 Type 2 diabetes mellitus with diabetic peripheral angiopathy without gangrene: Secondary | ICD-10-CM

## 2017-12-21 DIAGNOSIS — I358 Other nonrheumatic aortic valve disorders: Secondary | ICD-10-CM | POA: Insufficient documentation

## 2017-12-21 DIAGNOSIS — Z7982 Long term (current) use of aspirin: Secondary | ICD-10-CM | POA: Diagnosis not present

## 2017-12-21 DIAGNOSIS — R351 Nocturia: Secondary | ICD-10-CM

## 2017-12-21 DIAGNOSIS — F17210 Nicotine dependence, cigarettes, uncomplicated: Secondary | ICD-10-CM | POA: Insufficient documentation

## 2017-12-21 DIAGNOSIS — E785 Hyperlipidemia, unspecified: Secondary | ICD-10-CM | POA: Diagnosis not present

## 2017-12-21 DIAGNOSIS — D473 Essential (hemorrhagic) thrombocythemia: Secondary | ICD-10-CM | POA: Insufficient documentation

## 2017-12-21 DIAGNOSIS — E875 Hyperkalemia: Secondary | ICD-10-CM | POA: Diagnosis not present

## 2017-12-21 DIAGNOSIS — Z791 Long term (current) use of non-steroidal anti-inflammatories (NSAID): Secondary | ICD-10-CM | POA: Diagnosis not present

## 2017-12-21 DIAGNOSIS — D471 Chronic myeloproliferative disease: Secondary | ICD-10-CM

## 2017-12-21 LAB — COMPREHENSIVE METABOLIC PANEL
ALT: 15 U/L (ref 0–44)
AST: 27 U/L (ref 15–41)
Albumin: 3.4 g/dL — ABNORMAL LOW (ref 3.5–5.0)
Alkaline Phosphatase: 115 U/L (ref 38–126)
Anion gap: 9 (ref 5–15)
BUN: 12 mg/dL (ref 8–23)
CHLORIDE: 100 mmol/L (ref 98–111)
CO2: 25 mmol/L (ref 22–32)
CREATININE: 0.93 mg/dL (ref 0.61–1.24)
Calcium: 9.7 mg/dL (ref 8.9–10.3)
GFR calc Af Amer: >60 mL/min (ref >60)
GLUCOSE: 101 mg/dL — AB (ref 70–99)
Potassium: 4.4 mmol/L (ref 3.5–5.1)
SODIUM: 134 mmol/L — AB (ref 135–145)
Total Bilirubin: 0.9 mg/dL (ref 0.3–1.2)
Total Protein: 7.6 g/dL (ref 6.5–8.1)

## 2017-12-21 LAB — CBC WITH DIFFERENTIAL (CANCER CENTER ONLY)
ABS IMMATURE GRANULOCYTES: 0.55 10*3/uL — AB (ref 0.00–0.07)
Basophils Absolute: 0.4 10*3/uL — ABNORMAL HIGH (ref 0.0–0.1)
Basophils Relative: 2 %
EOS PCT: 1 %
Eosinophils Absolute: 0.2 10*3/uL (ref 0.0–0.5)
HEMATOCRIT: 53.7 % — AB (ref 39.0–52.0)
HEMOGLOBIN: 17.5 g/dL — AB (ref 13.0–17.0)
Immature Granulocytes: 2 %
LYMPHS ABS: 1.6 10*3/uL (ref 0.7–4.0)
LYMPHS PCT: 7 %
MCH: 27 pg (ref 26.0–34.0)
MCHC: 32.6 g/dL (ref 30.0–36.0)
MCV: 82.9 fL (ref 80.0–100.0)
MONO ABS: 1.2 10*3/uL — AB (ref 0.1–1.0)
MONOS PCT: 5 %
NEUTROS ABS: 19.4 10*3/uL — AB (ref 1.7–7.7)
Neutrophils Relative %: 83 %
Platelet Count: 314 10*3/uL (ref 150–400)
RBC: 6.48 MIL/uL — ABNORMAL HIGH (ref 4.22–5.81)
RDW: 30 % — ABNORMAL HIGH (ref 11.5–15.5)
WBC Count: 23.4 10*3/uL — ABNORMAL HIGH (ref 4.0–10.5)
nRBC: 0.6 % — ABNORMAL HIGH (ref 0.0–0.2)

## 2017-12-21 NOTE — Telephone Encounter (Signed)
Printed avs and calender of upcoming appointment. Per 12/3 los 

## 2017-12-21 NOTE — Patient Instructions (Addendum)
We discussed in detail the results of your laboratory studies from today.  Both your platelet count and white blood cell count decreased.  Your hemoglobin (red blood cells) have increased slightly.  This is not unusual.  Copies of your lab work were given for your review.  A therapeutic phlebotomy has been scheduled on December 6.  Barring any unforeseen complications, your next scheduled blood test with Dr. visit is on January 14, 2018.  Please do not hesitate to call in the interim should any new or untoward problems arise.  [facility]! Wishing you both the very best in health and happiness for the new year!  Ladona Ridgel, MD Hematology/Oncology  Therapeutic Phlebotomy Therapeutic phlebotomy is the controlled removal of blood from a person's body for the purpose of treating a medical condition. The procedure is similar to donating blood. Usually, about a pint (470 mL, or 0.47L) of blood is removed. The average adult has 9-12 pints (4.3-5.7 L) of blood. Therapeutic phlebotomy may be used to treat the following medical conditions:  Hemochromatosis. This is a condition in which the blood contains too much iron.  Polycythemia vera. This is a condition in which the blood contains too many red blood cells.  Porphyria cutanea tarda. This is a disease in which an important part of hemoglobin is not made properly. It results in the buildup of abnormal amounts of porphyrins in the body.  Sickle cell disease. This is a condition in which the red blood cells form an abnormal crescent shape rather than a round shape.  Tell a health care provider about:  Any allergies you have.  All medicines you are taking, including vitamins, herbs, eye drops, creams, and over-the-counter medicines.  Any problems you or family members have had with anesthetic medicines.  Any blood disorders you have.  Any surgeries you have had.  Any medical conditions you have. What are the risks? Generally,  this is a safe procedure. However, problems may occur, including:  Nausea or light-headedness.  Low blood pressure.  Soreness, bleeding, swelling, or bruising at the needle insertion site.  Infection.  What happens before the procedure?  Follow instructions from your health care provider about eating or drinking restrictions.  Ask your health care provider about changing or stopping your regular medicines. This is especially important if you are taking diabetes medicines or blood thinners.  Wear clothing with sleeves that can be raised above the elbow.  Plan to have someone take you home after the procedure.  You may have a blood sample taken. What happens during the procedure?  A needle will be inserted into one of your veins.  Tubing and a collection bag will be attached to that needle.  Blood will flow through the needle and tubing into the collection bag.  You may be asked to open and close your hand slowly and continually during the entire collection.  After the specified amount of blood has been removed from your body, the collection bag and tubing will be clamped.  The needle will be removed from your vein.  Pressure will be held on the site of the needle insertion to stop the bleeding.  A bandage (dressing) will be placed over the needle insertion site. The procedure may vary among health care providers and hospitals. What happens after the procedure?  Your recovery will be assessed and monitored.  You can return to your normal activities as directed by your health care provider. This information is not intended to replace advice given to  you by your health care provider. Make sure you discuss any questions you have with your health care provider. Document Released: 06/09/2010 Document Revised: 09/07/2015 Document Reviewed: 01/01/2014 Elsevier Interactive Patient Education  2018 [employer].  Therapeutic Phlebotomy, Care After Refer to this sheet in the next  few weeks. These instructions provide you with information about caring for yourself after your procedure. Your health care provider may also give you more specific instructions. Your treatment has been planned according to current medical practices, but problems sometimes occur. Call your health care provider if you have any problems or questions after your procedure. What can I expect after the procedure? After the procedure, it is common to have:  Light-headedness or dizziness. You may feel faint.  Nausea.  Tiredness.  Follow these instructions at home: Activity  Return to your normal activities as directed by your health care provider. Most people can go back to their normal activities right away.  Avoid strenuous physical activity and heavy lifting or pulling for about 5 hours after the procedure. Do not lift anything that is heavier than 10 lb (4.5 kg).  Athletes should avoid strenuous exercise for at least 12 hours.  Change positions slowly for the remainder of the day. This will help to prevent light-headedness or fainting.  If you feel light-headed, lie down until the feeling goes away. Eating and drinking  Be sure to eat well-balanced meals for the next 24 hours.  Drink enough fluid to keep your urine clear or pale yellow.  Avoid drinking alcohol on the day that you had the procedure. Care of the Needle Insertion Site  Keep your bandage dry. You can remove the bandage after about 5 hours or as directed by your health care provider.  If you have bleeding from the needle insertion site, elevate your arm and press firmly on the site until the bleeding stops.  If you have bruising at the site, apply ice to the area: ? Put ice in a plastic bag. ? Place a towel between your skin and the bag. ? Leave the ice on for 20 minutes, 2-3 times a day for the first 24 hours.  If the swelling does not go away after 24 hours, apply a warm, moist washcloth to the area for 20 minutes,  2-3 times a day. General instructions  Avoid smoking for at least 30 minutes after the procedure.  Keep all follow-up visits as directed by your health care provider. It is important to continue with further therapeutic phlebotomy treatments as directed. Contact a health care provider if:  You have redness, swelling, or pain at the needle insertion site.  You have fluid, blood, or pus coming from the needle insertion site.  You feel light-headed, dizzy, or nauseated, and the feeling does not go away.  You notice new bruising at the needle insertion site.  You feel weaker than normal.  You have a fever or chills. Get help right away if:  You have severe nausea or vomiting.  You have chest pain.  You have trouble breathing. This information is not intended to replace advice given to you by your health care provider. Make sure you discuss any questions you have with your health care provider. Document Released: 06/09/2010 Document Revised: 09/07/2015 Document Reviewed: 01/01/2014 Elsevier Interactive Patient Education  [employer].

## 2017-12-21 NOTE — Progress Notes (Signed)
Hematology/Oncology Outpatient Progress Note  Patient Name:  Justin [name]  DOB: 04/28/1955   Date of Service: December 21, 2017  Referring Provider: [name], MD [address] [place], [place] [address]   Consulting Physician: Henreitta Leber, MD Hematology/Oncology  Reason for Visit: In the setting of polycythemia vera, JAK2 V617F (83%) and CALR gene mutation (4%) identified and consistent with a myeloproliferative neoplasm; currently on hydroxyurea since November 15, he presents now for the results of laboratory studies and reevaluation.  Brief History: Justin [name] is a 62 year old resident of [place] whose past medical history is significant for non-insulin-requiring diabetes mellitus for the past 3 years; active tobacco use and dependence; longtime daily alcohol consumer, now discontinued; primary hypertension for at least the past 10 years; aortic stenosis; newly identified atrial fibrillation; peripheral vascular disease involving the left leg; urinary hesitancy; diabetic peripheral neuropathy; recent contusion of the third finger of the left hand; and degenerative joint disease involving the fingers, upper back, and lower back.  His primary care physician is Dr.Robert Psychiatric nurse. His attending cardiologist is Dr. Tharon Aquas Trigt.  He is accompanied by his attentive wife [name].  In the process of evaluating a recent cough associated with dyspnea, his laboratory studies revealed erythrocytosis, leukocytosis, and thrombocytosis.  A 2D echocardiogram was performed on October 7 which revealed a mild to moderate calcified aortic valve annulus. There were mildly thickened, moderately calcified leaflets.  Mild to moderate regurgitation was identified. Both right and left atria were mildly dilated. His mitral valve was normal. On November 12, 2017: A complete blood count shows hemoglobin 16.6 hematocrit 50.3 MCV 80 MCH 26.4 RDW 25.5 WBC 30.6; platelets 595,000.  A basic metabolic  panel showed sodium 135 potassium 6.4 chloride 95 CO2 19 BUN 11 creatinine 0.69 calcium 9.6.  PT/INR 12.9/1.2; aPTT 32.    He was prescribed furosemide for hyperkalemia.  His laboratory studies were repeated and have improved.  He fell about 8 weeks earlier and "jammed" the third finger on his left hand.  It was initially bruised, but not bleeding. There was recently swelling and purulent discharge which precipitated starting Bactrim DS: 1 tablet twice daily for 7 days prescribed by Dr. Lynelle Doctor.  It has subsequently became numb. He is an Occupational psychologist.  He grew up in [place] beginning at the age of 6 years. He has no known coronary artery disease, angina, or myocardial infarction. He denies seizure disorder or stroke syndrome. Over the past 7 months, he has voluntarily lost 45 pounds. In addition to urinary hesitancy, he has nocturia 3-4 times nightly.  In his feet and toes he has had severe "burning" which is easily controlled with pregabalin.  He has no venous thromboembolic disease.  In 2008 he began to smoke cigars: 3-6 daily. He no longer smokes cigarettes. He continues to smoke to the present. Having grown up in [place], he drank wine on a daily basis since the age of 23 years. He continues to drink mostly wine and beer much less frequently  At the time of his initial visit, laboratory studies were obtained to exclude an underlying myeloproliferative process. Those results suggested an underlying myeloproliferative neoplasm consistent with polycythemia vera rubra.  Both JAK2 V617F (83%) and CALR (4%) genetic alterations were identified. On November 15, he was started on hydroxyurea: 500 mg twice daily.  It is with this background he presents now for the results of his repeat laboratory studies and reevaluation in the setting of newly identified polycythemia vera  as outlined above.  Interval History: In the interim since his last visit, he reports no new problems or complaints.  Although  instructed to take aspirin 81 mg once daily, he is yet to start.  He has decreased his smoking, 2-3 cigars daily.  A coronary angiography is planned on December 6 under the direction of Dr. Prescott Gum, interventional cardiology.  He reports no bleeding tendency.  He no longer has any numbness or tingling in the feet/toes. There is no fever, shaking chills, sweats, or flulike symptoms.  He reports no rash or itching. He reports no visual changes or hearing deficit.  He has no unusual headaches, dizziness, lightheadedness, syncope, or near syncopal episodes. He has no unusual cough, sore throat, orthopnea.  He denies any dyspnea either at rest or on minimal exertion.  There is no pain or difficulty in swallowing. He has no heartburn or indigestion.  He denies nausea, vomiting, diarrhea, or constipation. He denies chest or abdominal pain. There is no melena or bright red blood per rectum. He reports no urinary frequency, urgency, hematuria, or dysuria.  He has urinary hesitancy and nocturia, 2-4 times nightly. Because of his peripheral vascular disease within the left lower extremity and atrial fibrillation, he continues on apixaban.   Past Medical History:  Diagnosis Date  . Acute meniscal tear of knee LEFT  . Aortic stenosis 12/01/2017   NONRHEUMATIC, AORTIC VALVE CALCIFICATIONS, MILD TO MODERATE REGURG, MILD TO MODERATE CALCIFIED ANNULUS per ECHO 10/25/17 @ [facility] [place]  . Arthritis   . Atrial fibrillation ([place]) 11/12/2017   AT O/V WITH PCP  . Heart murmur MILD-- ASYMPTOMATIC  . Hyperlipidemia   . Hypertension   . Left knee pain    Past Surgical History:  Procedure Laterality Date  . APPENDECTOMY  1998  . CATARACT EXTRACTION W/ INTRAOCULAR LENS  IMPLANT, BILATERAL  1998/  2000  . CERVICAL FUSION  1985   C4 - 5  . KNEE ARTHROSCOPY W/ MENISCECTOMY  1991   LEFT KNEE  . NASAL SINUS SURGERY  1982  . ROTATOR CUFF REPAIR  09-04-2005   LEFT SHOULDER  . TONSILLECTOMY  AGE 20   Allergies    Allergen Reactions  . Codeine Swelling  He has no food or seasonal allergies  Current Outpatient Medications on File Prior to Visit  Medication Sig  . apixaban (ELIQUIS) 5 MG TABS tablet Take 5 mg by mouth 2 (two) times daily.  Marland Kitchen aspirin 81 MG tablet Take 81 mg by mouth as needed for pain.  Marland Kitchen atenolol (TENORMIN) 50 MG tablet Take 50 mg by mouth daily.  . Cholecalciferol 1.25 MG (50000 UT) TABS Take by mouth.  . cyclobenzaprine (FLEXERIL) 10 MG tablet Take 10 mg by mouth 3 (three) times daily as needed.  . fish oil-omega-3 fatty acids 1000 MG capsule Take 1 g by mouth 2 (two) times daily.  . fluticasone (FLONASE) 50 MCG/ACT nasal spray Place 2 sprays into both nostrils daily.  . hydroxyurea (HYDREA) 500 MG capsule Take 1 capsule (500 mg total) by mouth 2 (two) times daily. May take with food to minimize GI side effects.  Marland Kitchen loratadine (CLARITIN) 10 MG tablet Take 10 mg by mouth daily.  . meloxicam (MOBIC) 15 MG tablet Take 15 mg by mouth 2 (two) times daily.  . Multiple Vitamin (MULTIVITAMIN) tablet Take 1 tablet by mouth daily.  . nefazodone (SERZONE) 100 MG tablet Take 100 mg by mouth 2 (two) times daily.  . pregabalin (LYRICA) 50 MG capsule  Take 50 mg by mouth 3 (three) times daily.  . simvastatin (ZOCOR) 40 MG tablet Take 40 mg by mouth every evening.  . zolpidem (AMBIEN) 10 MG tablet Take 10 mg by mouth at bedtime as needed.  Marland Kitchen HYDROcodone-acetaminophen (NORCO/VICODIN) 5-325 MG per tablet Take 1 tablet by mouth every 6 (six) hours as needed for moderate pain.   No current facility-administered medications on file prior to visit.     Review of Systems: Constitutional: No fever, sweats, or shaking chills.  No appetite deficit.  Voluntary 45 pound weight loss over the past 6 months. Skin: No rash, scaling, sores, lumps, or jaundice; previous squamous cell skin cancer completely excised from his arms HEENT: No visual changes or hearing deficit; no sinusitis; allergic  rhinitis. Pulmonary: No unusual cough, sore throat, or orthopnea.  DOE/COPD; active smoker; tobacco dependence. Cardiovascular: No coronary artery disease, angina, or myocardial infarction.  Atrial fibrillation; essential hypertension and dyslipidemia. Gastrointestinal: No indigestion, dysphagia, abdominal pain, diarrhea, or constipation.  No change in bowel habits.  No nausea or vomiting.  No melena or bright red blood per rectum. Genitourinary: No urinary frequency, urgency, hematuria, or dysuria; urinary hesitancy and nocturia (3-4 times nightly); renal calculi, 2007 Musculoskeletal: No new arthralgias or myalgias; degenerative joint disease in the fingers, upper back, and lower back.  No joint swelling, pain, or instability. Hematologic: No bleeding tendency; easy bruisability on apixaban. Endocrine: No intolerance to hot or cold; no thyroid disease; non-insulin-requiring diabetes mellitus. Vascular: Left lower extremity peripheral arterial disease with claudication.  No venous thromboembolic disease. Psychological: No anxiety, depression, or mood changes; no mental health illnesses. Neurological: No dizziness, lightheadedness, syncope, or near syncopal episodes; no numbness or tingling in the fingers or toes.  Physical Examination: Vital Signs: Body surface area is 2.09 meters squared.  Vitals:   12/21/17 1011  BP: 123/71  Pulse: 77  Resp: 18  Temp: 98.2 F (36.8 C)  SpO2: (!) 89%    Filed Weights   12/21/17 1011  Weight: 200 lb 3.2 oz (90.8 kg)  ECOG PERFORMANCE STATUS: 0-1 Constitutional:  Justin [name] is fully nourished and developed albeit overweight.  He looks age appropriate.  He is friendly and cooperative without respiratory compromise at rest. Skin: No rashes, scaling, dryness, jaundice, or itching. HEENT: Head is normocephalic and atraumatic.  Pupils are equal round and reactive to light and accommodation.  Nystagmus is present.  Sclerae are anicteric.  Conjunctivae are  pink.  No sinus tenderness nor oropharyngeal lesions.  Lips without cracking or peeling; tongue without mass, inflammation, or nodularity.  Mucous membranes are moist. Neck: Supple and symmetric.  No jugular venous distention or thyromegaly.  Trachea is midline. Lymphatics: No cervical or supraclavicular lymphadenopathy.  No epitrochlear, axillary, or inguinal lymphadenopathy is appreciated. Respiratory/chest: Thorax is symmetrical.  Breath sounds are clear to auscultation and percussion.  Normal excursion and respiratory effort. Back: Symmetric without deformity or tenderness. Cardiovascular: Heart rate and rhythm is irregularly irregular. Gastrointestinal: Abdomen is soft, nontender; no organomegaly.  Bowel sounds are normoactive.  No masses are appreciated. Extremities: On the third finger and his left hand in the distal interphalangeal joint, there is a bluish discoloration without laceration or tenderness at the site of previous work-related trauma. In the lower extremities, there is no asymmetric swelling, erythema, tenderness, or cord formation.  No clubbing, cyanosis, nor edema. Hematologic: No petechiae, hematomas, or ecchymoses. Psychological:  He is oriented to person, place, and time; normal affect, memory, and cognition. Neurological: There are no gross  neurologic deficits.  Laboratory Results: I have reviewed the data as listed: December 21, 2017  Ref Range & Units 09:48 31mo ago 69yr ago  WBC Count 4.0 - 10.5 K/uL 23.4High   30.7High   6.4   RBC 4.22 - 5.81 MIL/uL 6.48High   6.39High   3.68Low  R  Hemoglobin 13.0 - 17.0 g/dL 17.5High   16.8  11.7Low    HCT 39.0 - 52.0 % 53.7High   51.6  34.2Low    MCV 80.0 - 100.0 fL 82.9  80.8  92.9 R  MCH 26.0 - 34.0 pg 27.0  26.3    MCHC 30.0 - 36.0 g/dL 32.6  32.6  34.2   RDW 11.5 - 15.5 % 30.0High   27.3High   18.3High    Platelet Count 150 - 400 K/uL 314  526High   198.0 R  nRBC 0.0 - 0.2 % 0.6High   0.2    Neutrophils Relative % % 83  79   64.0 R  Neutro Abs 1.7 - 7.7 K/uL 19.4High   24.2High   4.1 R  Lymphocytes Relative % 7  6  25.3 R  Lymphs Abs 0.7 - 4.0 K/uL 1.6  1.8  1.6   Monocytes Relative % 5  5  9.1 R  Monocytes Absolute 0.1 - 1.0 K/uL 1.2High   1.6High   0.6   Eosinophils Relative % 1  1  1.3 R  Eosinophils Absolute 0.0 - 0.5 K/uL 0.2  0.4  0.1 R  Basophils Relative % 2  2  0.3 R  Basophils Absolute 0.0 - 0.1 K/uL 0.4High   0.6High   0.0   Immature Granulocytes % 2  7 CM   Abs Immature Granulocytes 0.00 - 0.07 K/uL 0.55High   2.09High  CM     Ref Range & Units 09:48 31mo ago  Sodium 135 - 145 mmol/L 134Low   139   Potassium 3.5 - 5.1 mmol/L 4.4  4.4   Chloride 98 - 111 mmol/L 100  101   CO2 22 - 32 mmol/L 25  27   Glucose, Bld 70 - 99 mg/dL 101High   109High    BUN 8 - 23 mg/dL 12  12   Creatinine, Ser 0.61 - 1.24 mg/dL 0.93  0.85   Calcium 8.9 - 10.3 mg/dL 9.7  10.1   Total Protein 6.5 - 8.1 g/dL 7.6  8.1   Albumin 3.5 - 5.0 g/dL 3.4Low   3.3Low    AST 15 - 41 U/L 27  33   ALT 0 - 44 U/L 15  21   Alkaline Phosphatase 38 - 126 U/L 115  136High    Total Bilirubin 0.3 - 1.2 mg/dL 0.9  1.2   GFR calc non Af Amer >60 mL/min >60  >60   GFR calc Af Amer >60 mL/min >60  >60 CM  Anion gap 5 - 15 9  11  CM   November 19, 2017: LDH 538 Uric acid 8.0 Ferritin 113 Erythropoietin 5.4  Coag Studies Interp Report  Von Willebrand panel Status:  Final result Visible to patient:  Yes (MyChart) Next appt:  12/07/2017 at 02:20 PM in Cardiology Buford Dresser, MD)  Component 2wk ago  Interpretation Note   Comment: (NOTE)  -------------------------------  COAGULATION:  VON WILLEBRAND FACTOR ASSESSMENT CURRENT RESULTS ASSESSMENT  The VWF:Ag is elevated. The VWF:RCo is normal. The FVIII is  normal.  VON WILLEBRAND FACTOR ASSESSMENT CURRENT RESULTS  INTERPRETATION  -  These results are not consistent with  a diagnosis of VWD  according to the current NHLBI guideline. VWF:Ag may be  spuriously elevated in  the presence of rheumatoid factor  since the VWF:Ag is significantly elevated out of proportion  to both VWF:RCo and FVIII activity.  VON WILLEBRAND FACTOR ASSESSMENT        Diagnostic/Imaging Studies: September 27, 2013 CHEST  2 VIEW  COMPARISON:  PA and lateral chest 09/18/2013.  FINDINGS: There is mild atelectasis in the lung bases. Lungs otherwise clear. Heart size normal. No pneumothorax or pleural effusion.  IMPRESSION: No acute disease.  Inge Rise M.D. 09/27/2013 11:54  Media Information           Document Information Pathology  [facility]  11/19/2017 23:59  Attached To:  Molecular Pathology Report - Scanned [[id number]]  Scanned Document on 11/19/17 with [provider]  Source Information   Hegarty, Kimberle A  [facility]   Summary/Assessment: In the setting of polycythemia vera JAK2 V617F (83%) and CALR gene mutation (4%) identified and consistent with a myeloproliferative neoplasm; currently on hydroxyurea since November 15, he presents now for the results of laboratory studies and reevaluation.  In the process of evaluating a recent cough associated with dyspnea, his laboratory studies revealed erythrocytosis, leukocytosis, and thrombocytosis.  A 2D echocardiogram was performed on October 7 which revealed a mild to moderate calcified aortic valve annulus. There were mildly thickened, moderately calcified leaflets.  Mild to moderate regurgitation was identified. Both right and left atria were mildly dilated. His mitral valve was normal. On November 12, 2017: A complete blood count shows hemoglobin 16.6 hematocrit 50.3 MCV 80 MCH 26.4 RDW 25.5 WBC 30.6; platelets 595,000.  A basic metabolic panel showed sodium 135 potassium 6.4 chloride 95 CO2 19 BUN 11 creatinine 0.69 calcium 9.6.  PT/INR 12.9/1.2; aPTT 32.    He was prescribed furosemide for hyperkalemia.  His laboratory studies were repeated and have improved.  He fell about 8 weeks  earlier and "jammed" the third finger on his left hand.  It was initially bruised, but not bleeding. There was recently swelling and purulent discharge which precipitated starting Bactrim DS: 1 tablet twice daily for 7 days prescribed by Dr. Lynelle Doctor.  It has subsequently became numb. He is an Occupational psychologist.  He grew up in [place] beginning at the age of 46 years. He has no known coronary artery disease, angina, or myocardial infarction. He denies seizure disorder or stroke syndrome. Over the past 7 months, he has voluntarily lost 45 pounds. In addition to urinary hesitancy, he has nocturia 3-4 times nightly.  In his feet and toes he has had severe "burning" which is easily controlled with pregabalin.  He has no venous thromboembolic disease.  In 2008 he began to smoke cigars: 3-6 daily. He no longer smokes cigarettes. He continues to smoke to the present. Having grown up in [place], he drank wine on a daily basis since the age of 80 years. He continues to drink mostly wine and beer much less frequently  At the time of his initial visit, laboratory studies were obtained to exclude an underlying myeloproliferative process. Those results suggested an underlying myeloproliferative neoplasm consistent with polycythemia vera rubra.  Both JAK2 V617F (83%) and CALR (4%) genetic alterations were identified. On November 15, he was started on hydroxyurea: 500 mg twice daily.  It is with this background he presents now for the results of his laboratory studies and reevaluation in the setting of newly identified polycythemia vera as outlined above.  In  the interim since his last visit, he reports no new problems or complaints.  Although instructed to take aspirin 81 mg once daily, he is yet to start.  He has decreased his smoking.  A coronary angiography is planned on December 6 under the direction of Dr. Prescott Gum, interventional cardiology.  He reports no bleeding tendency.  He no longer has any numbness or  tingling in the feet/toes. There is no fever, shaking chills, sweats, or flulike symptoms.  He reports no rash or itching. He reports no visual changes or hearing deficit.  He has no unusual headaches, dizziness, lightheadedness, syncope, or near syncopal episodes. He has no unusual cough, sore throat, orthopnea.  He denies any dyspnea either at rest or on minimal exertion.  There is no pain or difficulty in swallowing. He has no heartburn or indigestion.  He denies nausea, vomiting, diarrhea, or constipation. He denies chest or abdominal pain. There is no melena or bright red blood per rectum. He reports no urinary frequency, urgency, hematuria, or dysuria.  He has urinary hesitancy and nocturia, 2-4 times nightly. Because of his peripheral vascular disease within the left lower extremity and atrial fibrillation, he continues on apixaban.   His other comorbid problems include non-insulin-requiring diabetes mellitus for the past 3 years; active tobacco use and dependence; longtime daily alcohol consumer, now discontinued; primary hypertension for at least the past 10 years; aortic stenosis; newly identified atrial fibrillation; peripheral vascular disease involving the left leg; urinary hesitancy; diabetic peripheral neuropathy; recent contusion of the third finger of the left hand; and degenerative joint disease involving the fingers, upper back, and lower back.   Recommendation/Plan: The results of his laboratory studies from today were reviewed and discussed in detail.  Those results are outlined above. He is tolerating hydroxyurea without any adverse effects.  Overall he feels better with no evidence of sensory peripheral neuropathy or painful burning of his feet and toes. There is no longer any swelling of the ankles/feet.  Both the platelet count and white blood cell count are decreased.  His hemoglobin and red blood cell volume has increased slightly since starting hydroxyurea on November 15.  He was  given copies of his lab work for review.  A therapeutic phlebotomy was recommended on December 6.  He was given literature regarding the role, rationale, and mechanics of the procedure.  He was assured that this is not unusual in the setting of polycythemia vera. Cytoreductive therapy with hydroxyurea will still be necessary to control both his platelet and white blood cell count.  It was recommended that he continue hydroxyurea: 500 mg twice daily.  Unless instructed otherwise by cardiology, he should take aspirin: 81 mg once daily.  A follow-up visit has been scheduled for December 27.  At that time laboratory studies will be obtained with adjustment in hydroxyurea and/or therapeutic phlebotomy as necessary.  He was advised to call us in the interim should any new or untoward problems arise.  This note was dictated using voice activated technology/software.  Unfortunately, typographical errors are not uncommon, and transcription is subject to mistakes and regrettably misinterpretation.  If necessary, clarification of the above information can be discussed with me at any time.  FOLLOW UP: AS DIRECTED   cc:         [name], MD               Tharon Aquas Trigt MD   Henreitta Leber, MD  Hematology/Oncology [facility] [address]  [place], [place] [address] Office: [phone] IDXF: [phone]

## 2017-12-23 ENCOUNTER — Inpatient Hospital Stay: Payer: BLUE CROSS/BLUE SHIELD

## 2017-12-23 VITALS — BP 116/74 | HR 84 | Temp 97.7°F | Resp 18

## 2017-12-23 DIAGNOSIS — R351 Nocturia: Secondary | ICD-10-CM | POA: Diagnosis not present

## 2017-12-23 DIAGNOSIS — Z7982 Long term (current) use of aspirin: Secondary | ICD-10-CM | POA: Diagnosis not present

## 2017-12-23 DIAGNOSIS — Z79899 Other long term (current) drug therapy: Secondary | ICD-10-CM | POA: Diagnosis not present

## 2017-12-23 DIAGNOSIS — Z7901 Long term (current) use of anticoagulants: Secondary | ICD-10-CM | POA: Diagnosis not present

## 2017-12-23 DIAGNOSIS — R3911 Hesitancy of micturition: Secondary | ICD-10-CM | POA: Diagnosis not present

## 2017-12-23 DIAGNOSIS — E875 Hyperkalemia: Secondary | ICD-10-CM | POA: Diagnosis not present

## 2017-12-23 DIAGNOSIS — I4891 Unspecified atrial fibrillation: Secondary | ICD-10-CM | POA: Diagnosis not present

## 2017-12-23 DIAGNOSIS — D45 Polycythemia vera: Secondary | ICD-10-CM

## 2017-12-23 DIAGNOSIS — E1142 Type 2 diabetes mellitus with diabetic polyneuropathy: Secondary | ICD-10-CM | POA: Diagnosis not present

## 2017-12-23 DIAGNOSIS — F17210 Nicotine dependence, cigarettes, uncomplicated: Secondary | ICD-10-CM | POA: Diagnosis not present

## 2017-12-23 DIAGNOSIS — I10 Essential (primary) hypertension: Secondary | ICD-10-CM | POA: Diagnosis not present

## 2017-12-23 DIAGNOSIS — E1151 Type 2 diabetes mellitus with diabetic peripheral angiopathy without gangrene: Secondary | ICD-10-CM | POA: Diagnosis not present

## 2017-12-23 DIAGNOSIS — E785 Hyperlipidemia, unspecified: Secondary | ICD-10-CM | POA: Diagnosis not present

## 2017-12-23 DIAGNOSIS — I350 Nonrheumatic aortic (valve) stenosis: Secondary | ICD-10-CM | POA: Diagnosis not present

## 2017-12-23 DIAGNOSIS — E119 Type 2 diabetes mellitus without complications: Secondary | ICD-10-CM | POA: Diagnosis not present

## 2017-12-23 DIAGNOSIS — I358 Other nonrheumatic aortic valve disorders: Secondary | ICD-10-CM | POA: Diagnosis not present

## 2017-12-23 NOTE — Progress Notes (Signed)
Justin [name] presents today for phlebotomy per MD orders. Phlebotomy procedure started at 0822 via 16 G phlebotomy kit and ended at 0828. 540 grams removed. Patient observed for 30 minutes after procedure without any incident. Patient tolerated procedure well. IV needle removed intact.

## 2017-12-23 NOTE — Patient Instructions (Signed)

## 2017-12-24 ENCOUNTER — Other Ambulatory Visit: Payer: Self-pay

## 2017-12-24 ENCOUNTER — Encounter (HOSPITAL_COMMUNITY): Payer: Self-pay | Admitting: *Deleted

## 2017-12-24 ENCOUNTER — Ambulatory Visit (HOSPITAL_BASED_OUTPATIENT_CLINIC_OR_DEPARTMENT_OTHER): Payer: BLUE CROSS/BLUE SHIELD

## 2017-12-24 ENCOUNTER — Ambulatory Visit (HOSPITAL_COMMUNITY)
Admission: RE | Admit: 2017-12-24 | Discharge: 2017-12-24 | Disposition: A | Discharge status: Home / Self Care | Payer: BLUE CROSS/BLUE SHIELD | Source: Ambulatory Visit | Attending: Internal Medicine | Admitting: Internal Medicine

## 2017-12-24 ENCOUNTER — Encounter (HOSPITAL_COMMUNITY)
Admission: RE | Disposition: A | Discharge status: Home / Self Care | Payer: Self-pay | Source: Ambulatory Visit | Attending: Internal Medicine

## 2017-12-24 ENCOUNTER — Other Ambulatory Visit: Payer: Self-pay | Admitting: *Deleted

## 2017-12-24 DIAGNOSIS — Z885 Allergy status to narcotic agent status: Secondary | ICD-10-CM | POA: Diagnosis not present

## 2017-12-24 DIAGNOSIS — I2510 Atherosclerotic heart disease of native coronary artery without angina pectoris: Secondary | ICD-10-CM

## 2017-12-24 DIAGNOSIS — R0609 Other forms of dyspnea: Secondary | ICD-10-CM | POA: Diagnosis not present

## 2017-12-24 DIAGNOSIS — I4891 Unspecified atrial fibrillation: Secondary | ICD-10-CM | POA: Diagnosis not present

## 2017-12-24 DIAGNOSIS — I10 Essential (primary) hypertension: Secondary | ICD-10-CM | POA: Insufficient documentation

## 2017-12-24 DIAGNOSIS — Z791 Long term (current) use of non-steroidal anti-inflammatories (NSAID): Secondary | ICD-10-CM | POA: Diagnosis not present

## 2017-12-24 DIAGNOSIS — E785 Hyperlipidemia, unspecified: Secondary | ICD-10-CM | POA: Insufficient documentation

## 2017-12-24 DIAGNOSIS — D45 Polycythemia vera: Secondary | ICD-10-CM | POA: Diagnosis not present

## 2017-12-24 DIAGNOSIS — E1151 Type 2 diabetes mellitus with diabetic peripheral angiopathy without gangrene: Secondary | ICD-10-CM | POA: Diagnosis not present

## 2017-12-24 DIAGNOSIS — F17210 Nicotine dependence, cigarettes, uncomplicated: Secondary | ICD-10-CM | POA: Diagnosis not present

## 2017-12-24 DIAGNOSIS — I350 Nonrheumatic aortic (valve) stenosis: Secondary | ICD-10-CM

## 2017-12-24 DIAGNOSIS — E669 Obesity, unspecified: Secondary | ICD-10-CM | POA: Diagnosis not present

## 2017-12-24 DIAGNOSIS — F17290 Nicotine dependence, other tobacco product, uncomplicated: Secondary | ICD-10-CM | POA: Diagnosis not present

## 2017-12-24 DIAGNOSIS — Z7901 Long term (current) use of anticoagulants: Secondary | ICD-10-CM | POA: Diagnosis not present

## 2017-12-24 DIAGNOSIS — M1990 Unspecified osteoarthritis, unspecified site: Secondary | ICD-10-CM | POA: Insufficient documentation

## 2017-12-24 DIAGNOSIS — Z7982 Long term (current) use of aspirin: Secondary | ICD-10-CM | POA: Insufficient documentation

## 2017-12-24 HISTORY — PX: RIGHT/LEFT HEART CATH AND CORONARY ANGIOGRAPHY: CATH118266

## 2017-12-24 HISTORY — PX: TEE WITHOUT CARDIOVERSION: SHX5443

## 2017-12-24 LAB — CBC
HCT: 50 % (ref 39.0–52.0)
HEMOGLOBIN: 15.7 g/dL (ref 13.0–17.0)
MCH: 26.3 pg (ref 26.0–34.0)
MCHC: 31.4 g/dL (ref 30.0–36.0)
MCV: 83.6 fL (ref 80.0–100.0)
NRBC: 0.3 % — AB (ref 0.0–0.2)
Platelets: 270 10*3/uL (ref 150–400)
RBC: 5.98 MIL/uL — AB (ref 4.22–5.81)
RDW: 29.9 % — ABNORMAL HIGH (ref 11.5–15.5)
WBC: 20.9 10*3/uL — ABNORMAL HIGH (ref 4.0–10.5)

## 2017-12-24 LAB — POCT I-STAT 3, VENOUS BLOOD GAS (G3P V)
Acid-base deficit: 2 mmol/L (ref 0.0–2.0)
Acid-base deficit: 2 mmol/L (ref 0.0–2.0)
Acid-base deficit: 3 mmol/L — ABNORMAL HIGH (ref 0.0–2.0)
Bicarbonate: 21.9 mmol/L (ref 20.0–28.0)
Bicarbonate: 22.6 mmol/L (ref 20.0–28.0)
Bicarbonate: 23.7 mmol/L (ref 20.0–28.0)
O2 Saturation: 73 %
O2 Saturation: 75 %
O2 Saturation: 97 %
PH VEN: 7.351 (ref 7.250–7.430)
PH VEN: 7.356 (ref 7.250–7.430)
TCO2: 23 mmol/L (ref 22–32)
TCO2: 24 mmol/L (ref 22–32)
TCO2: 25 mmol/L (ref 22–32)
pCO2, Ven: 38.5 mmHg — ABNORMAL LOW (ref 44.0–60.0)
pCO2, Ven: 39.1 mmHg — ABNORMAL LOW (ref 44.0–60.0)
pCO2, Ven: 42.8 mmHg — ABNORMAL LOW (ref 44.0–60.0)
pH, Ven: 7.377 (ref 7.250–7.430)
pO2, Ven: 41 mmHg (ref 32.0–45.0)
pO2, Ven: 41 mmHg (ref 32.0–45.0)
pO2, Ven: 92 mmHg — ABNORMAL HIGH (ref 32.0–45.0)

## 2017-12-24 LAB — GLUCOSE, CAPILLARY: Glucose-Capillary: 110 mg/dL — ABNORMAL HIGH (ref 70–99)

## 2017-12-24 SURGERY — ECHOCARDIOGRAM, TRANSESOPHAGEAL
Anesthesia: Moderate Sedation

## 2017-12-24 SURGERY — RIGHT/LEFT HEART CATH AND CORONARY ANGIOGRAPHY
Anesthesia: LOCAL

## 2017-12-24 MED ORDER — FENTANYL CITRATE (PF) 100 MCG/2ML IJ SOLN
INTRAMUSCULAR | Status: DC | PRN
  Administered 2017-12-24 (×3): 25 ug via INTRAVENOUS

## 2017-12-24 MED ORDER — SODIUM CHLORIDE 0.9 % IV SOLN
INTRAVENOUS | Status: DC
  Administered 2017-12-24: 08:00:00 via INTRAVENOUS

## 2017-12-24 MED ORDER — ASPIRIN 81 MG PO CHEW
81.0000 mg | CHEWABLE_TABLET | ORAL | Status: AC
  Administered 2017-12-24: 81 mg via ORAL
  Filled 2017-12-24: qty 1

## 2017-12-24 MED ORDER — IOHEXOL 350 MG/ML SOLN
INTRAVENOUS | Status: DC | PRN
  Administered 2017-12-24: 70 mL via INTRA_ARTERIAL

## 2017-12-24 MED ORDER — SODIUM CHLORIDE 0.9 % IV SOLN: 250.0000 mL | INTRAVENOUS | Status: DC | PRN

## 2017-12-24 MED ORDER — FENTANYL CITRATE (PF) 100 MCG/2ML IJ SOLN
INTRAMUSCULAR | Status: AC
  Filled 2017-12-24: qty 2

## 2017-12-24 MED ORDER — ACETAMINOPHEN 325 MG PO TABS: 650.0000 mg | ORAL_TABLET | ORAL | Status: DC | PRN

## 2017-12-24 MED ORDER — SODIUM CHLORIDE 0.9% FLUSH: 3.0000 mL | Freq: Two times a day (BID) | INTRAVENOUS | Status: DC

## 2017-12-24 MED ORDER — SODIUM CHLORIDE 0.9 % IV SOLN: INTRAVENOUS | Status: DC

## 2017-12-24 MED ORDER — ONDANSETRON HCL 4 MG/2ML IJ SOLN: 4.0000 mg | Freq: Four times a day (QID) | INTRAMUSCULAR | Status: DC | PRN

## 2017-12-24 MED ORDER — HEPARIN (PORCINE) IN NACL 1000-0.9 UT/500ML-% IV SOLN
INTRAVENOUS | Status: DC | PRN
  Administered 2017-12-24: 500 mL

## 2017-12-24 MED ORDER — LIDOCAINE VISCOUS HCL 2 % MT SOLN
OROMUCOSAL | Status: DC | PRN
  Administered 2017-12-24: 1 via OROMUCOSAL

## 2017-12-24 MED ORDER — LIDOCAINE VISCOUS HCL 2 % MT SOLN
OROMUCOSAL | Status: AC
  Filled 2017-12-24: qty 15

## 2017-12-24 MED ORDER — VERAPAMIL HCL 2.5 MG/ML IV SOLN
INTRAVENOUS | Status: DC | PRN
  Administered 2017-12-24: 10 mL via INTRA_ARTERIAL

## 2017-12-24 MED ORDER — HEPARIN SODIUM (PORCINE) 1000 UNIT/ML IJ SOLN
INTRAMUSCULAR | Status: DC | PRN
  Administered 2017-12-24: 4500 [IU] via INTRAVENOUS

## 2017-12-24 MED ORDER — HEPARIN (PORCINE) IN NACL 1000-0.9 UT/500ML-% IV SOLN
INTRAVENOUS | Status: AC
  Filled 2017-12-24: qty 1000

## 2017-12-24 MED ORDER — MIDAZOLAM HCL (PF) 10 MG/2ML IJ SOLN
INTRAMUSCULAR | Status: DC | PRN
  Administered 2017-12-24 (×2): 2 mg via INTRAVENOUS
  Administered 2017-12-24: 1 mg via INTRAVENOUS

## 2017-12-24 MED ORDER — VERAPAMIL HCL 2.5 MG/ML IV SOLN
INTRAVENOUS | Status: AC
  Filled 2017-12-24: qty 2

## 2017-12-24 MED ORDER — SODIUM CHLORIDE 0.9% FLUSH: 3.0000 mL | INTRAVENOUS | Status: DC | PRN

## 2017-12-24 MED ORDER — LIDOCAINE HCL (PF) 1 % IJ SOLN
INTRAMUSCULAR | Status: DC | PRN
  Administered 2017-12-24: 2 mL
  Administered 2017-12-24: 1 mL

## 2017-12-24 MED ORDER — MIDAZOLAM HCL (PF) 5 MG/ML IJ SOLN
INTRAMUSCULAR | Status: AC
  Filled 2017-12-24: qty 2

## 2017-12-24 MED ORDER — LIDOCAINE HCL (PF) 1 % IJ SOLN
INTRAMUSCULAR | Status: AC
  Filled 2017-12-24: qty 30

## 2017-12-24 SURGICAL SUPPLY — 12 items
CATH 5FR JL3.5 JR4 ANG PIG MP (CATHETERS) ×1 IMPLANT
CATH BALLN WEDGE 5F 110CM (CATHETERS) ×1 IMPLANT
DEVICE RAD COMP TR BAND LRG (VASCULAR PRODUCTS) ×1 IMPLANT
GLIDESHEATH SLEND SS 6F .021 (SHEATH) ×1 IMPLANT
GUIDEWIRE INQWIRE 1.5J.035X260 (WIRE) IMPLANT
INQWIRE 1.5J .035X260CM (WIRE) ×2
KIT HEART LEFT (KITS) ×2 IMPLANT
PACK CARDIAC CATHETERIZATION (CUSTOM PROCEDURE TRAY) ×2 IMPLANT
SHEATH GLIDE SLENDER 4/5FR (SHEATH) ×1 IMPLANT
SHEATH PINNACLE ST 7F 65CM (SHEATH) ×1 IMPLANT
TRANSDUCER W/STOPCOCK (MISCELLANEOUS) ×2 IMPLANT
TUBING CIL FLEX 10 FLL-RA (TUBING) ×2 IMPLANT

## 2017-12-24 NOTE — Progress Notes (Signed)
Venipuncture performed for CBC

## 2017-12-24 NOTE — CV Procedure (Signed)
    TRANSESOPHAGEAL ECHOCARDIOGRAM   NAME:  Justin [name]   MRN: [id number] DOB:  06/06/1955   ADMIT DATE: 12/24/2017  INDICATIONS:  Aortic stenosis  PROCEDURE:   Informed consent was obtained prior to the procedure. The risks, benefits and alternatives for the procedure were discussed and the patient comprehended these risks.  Risks include, but are not limited to, cough, sore throat, vomiting, nausea, somnolence, esophageal and stomach trauma or perforation, bleeding, low blood pressure, aspiration, pneumonia, infection, trauma to the teeth and death.    After a procedural time-out, the patient was given 5 mg versed and 75 mcg fentanyl for moderate sedation.  The oropharynx was anesthetized 10 cc of topical 1% viscous lidocaine.  The transesophageal probe was inserted in the esophagus and stomach without difficulty and multiple views were obtained.    COMPLICATIONS:    There were no immediate complications.  FINDINGS:  LEFT VENTRICLE: EF = 55%. No regional wall motion abnormalities.  RIGHT VENTRICLE: Normal size and function.   LEFT ATRIUM: Dilated  LEFT ATRIAL APPENDAGE: No thrombus.   RIGHT ATRIUM:  Dilated  AORTIC VALVE:  Trileaflet. Moderately calcified. Partial fusion of LCC and Cerulean. Moderate AS. Mild AI. Mean gradient 107mmHG. AVA by planimetry 0.95cm2 (likely underestimated)  MITRAL VALVE:    Normal. Mild to moderate MR.   TRICUSPID VALVE: Normal. Mild TR  PULMONIC VALVE: Grossly normal. Trivial PI  INTERATRIAL SEPTUM: No PFO or ASD.  PERICARDIUM: No effusion  DESCENDING AORTA: Moderate plaque   [name],MD 8:55 AM

## 2017-12-24 NOTE — Interval H&P Note (Signed)
History and Physical Interval Note:  12/24/2017 9:01 AM  Justin [name]  has presented today for surgery, with the diagnosis of Afib  The various methods of treatment have been discussed with the patient and family. After consideration of risks, benefits and other options for treatment, the patient has consented to  Procedure(s): RIGHT/LEFT HEART CATH AND CORONARY ANGIOGRAPHY (N/A) possible coronary angioplasty as a surgical intervention .  The patient's history has been reviewed, patient examined, no change in status, stable for surgery.  I have reviewed the patient's chart and labs.  Questions were answered to the patient's satisfaction.     [name]

## 2017-12-24 NOTE — Interval H&P Note (Signed)
History and Physical Interval Note:  12/24/2017 8:10 AM  Justin [name]  has presented today for surgery, with the diagnosis of AFIB  The various methods of treatment have been discussed with the patient and family. After consideration of risks, benefits and other options for treatment, the patient has consented to  Procedure(s): TRANSESOPHAGEAL ECHOCARDIOGRAM (TEE) (N/A) as a surgical intervention .  The patient's history has been reviewed, patient examined, no change in status, stable for surgery.  I have reviewed the patient's chart and labs.  Questions were answered to the patient's satisfaction.     Cheryn Lundquist

## 2017-12-24 NOTE — Discharge Instructions (Signed)
Drink plenty of fluids over next 48 hours and keep right wrist elevated at heart level for 24 hours ° °Radial Site Care °Refer to this sheet in the next few weeks. These instructions provide you with information about caring for yourself after your procedure. Your health care provider may also give you more specific instructions. Your treatment has been planned according to current medical practices, but problems sometimes occur. Call your health care provider if you have any problems or questions after your procedure. °What can I expect after the procedure? °After your procedure, it is typical to have the following: °· Bruising at the radial site that usually fades within 1-2 weeks. °· Blood collecting in the tissue (hematoma) that may be painful to the touch. It should usually decrease in size and tenderness within 1-2 weeks. ° °Follow these instructions at home: °· Take medicines only as directed by your health care provider. °· You may shower 24-48 hours after the procedure or as directed by your health care provider. Remove the bandage (dressing) and gently wash the site with plain soap and water. Pat the area dry with a clean towel. Do not rub the site, because this may cause bleeding. °· Do not take baths, swim, or use a hot tub until your health care provider approves. °· Check your insertion site every day for redness, swelling, or drainage. °· Do not apply powder or lotion to the site. °· Do not flex or bend the affected arm for 24 hours or as directed by your health care provider. °· Do not push or pull heavy objects with the affected arm for 24 hours or as directed by your health care provider. °· Do not lift over 10 lb (4.5 kg) for 5 days after your procedure or as directed by your health care provider. °· Ask your health care provider when it is okay to: °? Return to work or school. °? Resume usual physical activities or sports. °? Resume sexual activity. °· Do not drive home if you are discharged the  same day as the procedure. Have someone else drive you. °· You may drive 24 hours after the procedure unless otherwise instructed by your health care provider. °· Do not operate machinery or power tools for 24 hours after the procedure. °· If your procedure was done as an outpatient procedure, which means that you went home the same day as your procedure, a responsible adult should be with you for the first 24 hours after you arrive home. °· Keep all follow-up visits as directed by your health care provider. This is important. °Contact a health care provider if: °· You have a fever. °· You have chills. °· You have increased bleeding from the radial site. Hold pressure on the site. °Get help right away if: °· You have unusual pain at the radial site. °· You have redness, warmth, or swelling at the radial site. °· You have drainage (other than a small amount of blood on the dressing) from the radial site. °· The radial site is bleeding, and the bleeding does not stop after 30 minutes of holding steady pressure on the site. °· Your arm or hand becomes pale, cool, tingly, or numb. °This information is not intended to replace advice given to you by your health care provider. Make sure you discuss any questions you have with your health care provider. °Document Released: 02/07/2010 Document Revised: 06/13/2015 Document Reviewed: 07/24/2013 °Elsevier Interactive Patient Education © 2018 [employer] ° °

## 2017-12-27 ENCOUNTER — Encounter (HOSPITAL_COMMUNITY): Payer: Self-pay | Admitting: Internal Medicine

## 2017-12-29 ENCOUNTER — Other Ambulatory Visit: Payer: Self-pay

## 2017-12-29 ENCOUNTER — Ambulatory Visit: Payer: BLUE CROSS/BLUE SHIELD | Admitting: Cardiothoracic Surgery

## 2017-12-29 ENCOUNTER — Encounter: Payer: Self-pay | Admitting: Cardiothoracic Surgery

## 2017-12-29 VITALS — BP 128/68 | HR 78 | Resp 16 | Ht 68.0 in | Wt 200.0 lb

## 2017-12-29 DIAGNOSIS — I2510 Atherosclerotic heart disease of native coronary artery without angina pectoris: Secondary | ICD-10-CM

## 2017-12-29 DIAGNOSIS — I4891 Unspecified atrial fibrillation: Secondary | ICD-10-CM | POA: Diagnosis not present

## 2017-12-29 DIAGNOSIS — I350 Nonrheumatic aortic (valve) stenosis: Secondary | ICD-10-CM | POA: Diagnosis not present

## 2017-12-29 NOTE — Pre-Procedure Instructions (Signed)
Jonanthony Nahar Hari  12/29/2017      [id number] [address] [place] [address] Phone: [phone] Fax: [phone]    Your procedure is scheduled on December 16  Report to [place] at [place].M.  Call this number if you have problems the morning of surgery:  [phone]   Remember:  Do not eat or drink after midnight.      Take these medicines the morning of surgery with A SIP OF WATER  atenolol (TENORMIN) cyclobenzaprine (FLEXERIL)  fluticasone (FLONASE) HYDROcodone-acetaminophen (NORCO/VICODIN) if needed loratadine (CLARITIN) hydroxyurea (HYDREA) pregabalin (LYRICA)  FOLLOW PHYSICIANS INSTRUCTIONS ABOUT ELIQUIS AND ASPIRIN  7 days prior to surgery STOP taking any Aleve, Naproxen, Ibuprofen, Motrin, Advil, Goody's, BC's, all herbal medications, fish oil, and all vitamins     Do not wear jewelry  Do not wear lotions, powders, or cologne, or deodorant.  Men may shave face and neck.  Do not bring valuables to the hospital.  [facility] is not responsible for any belongings or valuables.  Contacts, dentures or bridgework may not be worn into surgery.  Leave your suitcase in the car.  After surgery it may be brought to your room.  For patients admitted to the hospital, discharge time will be determined by your treatment team.  Patients discharged the day of surgery will not be allowed to drive home.    Special instructions:   [place]- Preparing For Surgery  Before surgery, you can play an important role. Because skin is not sterile, your skin needs to be as free of germs as possible. You can reduce the number of germs on your skin by washing with CHG (chlorahexidine gluconate) Soap before surgery.  CHG is an antiseptic cleaner which kills germs and bonds with the skin to continue killing germs even after washing.    Oral Hygiene is also important to reduce your risk of infection.  Remember  - BRUSH YOUR TEETH THE MORNING OF SURGERY WITH YOUR REGULAR TOOTHPASTE  Please do not use if you have an allergy to CHG or antibacterial soaps. If your skin becomes reddened/irritated stop using the CHG.  Do not shave (including legs and underarms) for at least 48 hours prior to first CHG shower. It is OK to shave your face.  Please follow these instructions carefully.   1. Shower the NIGHT BEFORE SURGERY and the MORNING OF SURGERY with CHG.   2. If you chose to wash your hair, wash your hair first as usual with your normal shampoo.  3. After you shampoo, rinse your hair and body thoroughly to remove the shampoo.  4. Use CHG as you would any other liquid soap. You can apply CHG directly to the skin and wash gently with a scrungie or a clean washcloth.   5. Apply the CHG Soap to your body ONLY FROM THE NECK DOWN.  Do not use on open wounds or open sores. Avoid contact with your eyes, ears, mouth and genitals (private parts). Wash Face and genitals (private parts)  with your normal soap.  6. Wash thoroughly, paying special attention to the area where your surgery will be performed.  7. Thoroughly rinse your body with warm water from the neck down.  8. DO NOT shower/wash with your normal soap after using and rinsing off the CHG Soap.  9. Pat yourself dry with a CLEAN TOWEL.  10. Wear CLEAN PAJAMAS to bed the night before surgery, wear  comfortable clothes the morning of surgery  11. Place CLEAN SHEETS on your bed the night of your first shower and DO NOT SLEEP WITH PETS.    Day of Surgery:  Do not apply any deodorants/lotions.  Please wear clean clothes to the hospital/surgery center.   Remember to brush your teeth WITH YOUR REGULAR TOOTHPASTE.    Please read over the following fact sheets that you were given.

## 2017-12-29 NOTE — Progress Notes (Signed)
PCP is [name], MD Referring Provider is [name], MD  Chief Complaint  Patient presents with  . Follow-up    1 wk after CATH/TEE 12/24/17...needs AVR/CABG +/- MAZE per cardiologist     HPI: Patient returns for scheduled office visit after initial consultation followed by left and right heart catheterization and TEE  Moderate aortic stenosis with mean gradient 33 mmHg peak gradient 55 mmHg LVEF 45%, global hypokinesia Severe multivessel CAD with 90% LAD stenosis 90% RCA stenosis 80% circumflex disease.  Right heart cath data shows compensated hemodynamics with normal cardiac output and coox 70%. COPD with active smoking, PFTs pending, chest x-ray pending Recent onset atrial fibrillation on Eliquis Recent diagnosis polycythemia vera placed on hydroxyurea and 81 mg aspirin Obesity with successful intentional weight loss of 25 pounds on low-carb diet Severe lower back arthritis Past Medical History:  Diagnosis Date  . Acute meniscal tear of knee LEFT  . Aortic stenosis 12/01/2017   NONRHEUMATIC, AORTIC VALVE CALCIFICATIONS, MILD TO MODERATE REGURG, MILD TO MODERATE CALCIFIED ANNULUS per ECHO 10/25/17 @ [facility]  . Arthritis   . Atrial fibrillation ([place]) 11/12/2017   AT O/V WITH PCP  . Heart murmur MILD-- ASYMPTOMATIC  . Hyperlipidemia   . Hypertension   . Left knee pain     Past Surgical History:  Procedure Laterality Date  . APPENDECTOMY  1998  . CATARACT EXTRACTION W/ INTRAOCULAR LENS  IMPLANT, BILATERAL  1998/  2000  . CERVICAL FUSION  1985   C4 - 5  . KNEE ARTHROSCOPY W/ MENISCECTOMY  1991   LEFT KNEE  . NASAL SINUS SURGERY  1982  . RIGHT/LEFT HEART CATH AND CORONARY ANGIOGRAPHY N/A 12/24/2017   Procedure: RIGHT/LEFT HEART CATH AND CORONARY ANGIOGRAPHY;  Surgeon: Jolaine Artist, MD;  Location: [facility];  Service: Cardiovascular;  Laterality: N/A;  . ROTATOR CUFF REPAIR  09-04-2005   LEFT SHOULDER  . TEE WITHOUT CARDIOVERSION N/A  12/24/2017   Procedure: TRANSESOPHAGEAL ECHOCARDIOGRAM (TEE);  Surgeon: Jolaine Artist, MD;  Location: [facility]OSCOPY;  Service: Cardiovascular;  Laterality: N/A;  . TONSILLECTOMY  AGE 7    Family History  Adopted: Yes  Problem Relation Age of Onset  . Cancer Father     Social History Social History   Tobacco Use  . Smoking status: Current Some Day Smoker    Packs/day: 0.25    Years: 23.00    Pack years: 5.75    Types: Cigars  . Smokeless tobacco: Never Used  . Tobacco comment: quit cigs in 2011 and smokes cigars daily- from 3-10 cigars-09/27/13  Substance Use Topics  . Alcohol use: Yes    Comment: OCCASIONAL  . Drug use: No    Current Outpatient Medications  Medication Sig Dispense Refill  . aspirin 81 MG tablet Take 81 mg by mouth daily.     Marland Kitchen atenolol (TENORMIN) 50 MG tablet Take 50 mg by mouth daily.    . Cholecalciferol 125 MCG (5000 UT) capsule Take 1 capsule by mouth at bedtime.     . cyclobenzaprine (FLEXERIL) 10 MG tablet Take 10 mg by mouth 3 (three) times daily.     . fluticasone (FLONASE) 50 MCG/ACT nasal spray Place 2 sprays into both nostrils daily.    . hydrochlorothiazide (MICROZIDE) 12.5 MG capsule Take 12.5 mg by mouth daily.    Marland Kitchen HYDROcodone-acetaminophen (NORCO/VICODIN) 5-325 MG per tablet Take 1 tablet by mouth every 6 (six) hours as needed for moderate pain.    . hydroxyurea (  HYDREA) 500 MG capsule Take 1 capsule (500 mg total) by mouth 2 (two) times daily. May take with food to minimize GI side effects. 60 capsule 1  . loratadine (CLARITIN) 10 MG tablet Take 10 mg by mouth daily.    . meloxicam (MOBIC) 15 MG tablet Take 15 mg by mouth daily.     . Multiple Vitamin (MULTIVITAMIN) tablet Take 1 tablet by mouth daily.    . nefazodone (SERZONE) 100 MG tablet Take 100 mg by mouth 2 (two) times daily.    . Omega 3 1200 MG CAPS Take 1 capsule by mouth 2 (two) times daily.    . pregabalin (LYRICA) 50 MG capsule Take 50 mg by mouth 3 (three) times daily.    .  simvastatin (ZOCOR) 40 MG tablet Take 40 mg by mouth every evening.    . zolpidem (AMBIEN) 10 MG tablet Take 10 mg by mouth at bedtime as needed.    Marland Kitchen apixaban (ELIQUIS) 5 MG TABS tablet Take 5 mg by mouth 2 (two) times daily.     No current facility-administered medications for this visit.     Allergies  Allergen Reactions  . Codeine Swelling    Tolerates hydrocodone and oxycodone     Review of Systems  Patient had a unit of blood removed approximately 10 days ago for polycythemia Patient still smoking 1 to 2 cigars daily. No fever no productive cough shortness of breath syncope or abdominal pain or dysuria  BP 128/68 (BP Location: Right Arm, Patient Position: Sitting, Cuff Size: Large)   Pulse 78   Resp 16   Ht 5\' 8"  (1.727 m)   Wt 200 lb (90.7 kg)   SpO2 98% Comment: ON RA  BMI 30.41 kg/m  Physical Exam      Exam    General- alert and comfortable    Neck- no JVD, no cervical adenopathy palpable, no carotid bruit   Lungs- clear without rales, wheezes   Cor-regular rhythm from atrial fibrillation, 3/6 systolic ejection murmur of aortic stenosis, no gallop   Abdomen- soft, non-tender   Extremities - warm, non-tender, minimal edema   Neuro- oriented, appropriate, no focal weakness   Diagnostic Tests: Images of coronary angiogram, echocardiogram-TEE personally reviewed and counseled with patient.   Impression: Patient will be scheduled for multivessel CABG with combined aortic valve replacement with a bioprosthetic valve on Monday, December 16.  Possible left atrial isolation with Maze procedure as well.  I discussed the procedure in detail with the patient and his partner including the benefits and risks.  He has stopped his Eliquis and will stop the hydroxyurea and stop smoking before surgery.   Plan: AVR CABG plus minus left atrial Maze procedure on December 16 at [place] planned for tomorrow.   Len Childs, MD [facility] [phone]

## 2017-12-30 ENCOUNTER — Other Ambulatory Visit: Payer: Self-pay | Admitting: *Deleted

## 2017-12-30 ENCOUNTER — Encounter: Payer: Self-pay | Admitting: Vascular Surgery

## 2017-12-30 ENCOUNTER — Ambulatory Visit (HOSPITAL_COMMUNITY)
Admission: RE | Admit: 2017-12-30 | Discharge: 2017-12-30 | Disposition: A | Discharge status: Home / Self Care | Payer: BLUE CROSS/BLUE SHIELD | Source: Ambulatory Visit | Attending: Cardiothoracic Surgery | Admitting: Cardiothoracic Surgery

## 2017-12-30 ENCOUNTER — Other Ambulatory Visit: Payer: Self-pay

## 2017-12-30 ENCOUNTER — Encounter (HOSPITAL_COMMUNITY)
Admission: RE | Admit: 2017-12-30 | Discharge: 2017-12-30 | Disposition: A | Discharge status: Home / Self Care | Payer: BLUE CROSS/BLUE SHIELD | Source: Ambulatory Visit | Attending: Cardiothoracic Surgery | Admitting: Cardiothoracic Surgery

## 2017-12-30 ENCOUNTER — Ambulatory Visit (INDEPENDENT_AMBULATORY_CARE_PROVIDER_SITE_OTHER): Payer: BLUE CROSS/BLUE SHIELD | Admitting: Vascular Surgery

## 2017-12-30 VITALS — BP 144/71 | HR 97 | Temp 97.0°F | Resp 18 | Ht 68.0 in | Wt 197.0 lb

## 2017-12-30 DIAGNOSIS — I4891 Unspecified atrial fibrillation: Secondary | ICD-10-CM

## 2017-12-30 DIAGNOSIS — I350 Nonrheumatic aortic (valve) stenosis: Secondary | ICD-10-CM | POA: Diagnosis not present

## 2017-12-30 DIAGNOSIS — I2510 Atherosclerotic heart disease of native coronary artery without angina pectoris: Secondary | ICD-10-CM

## 2017-12-30 DIAGNOSIS — Z01818 Encounter for other preprocedural examination: Secondary | ICD-10-CM | POA: Insufficient documentation

## 2017-12-30 DIAGNOSIS — I739 Peripheral vascular disease, unspecified: Secondary | ICD-10-CM

## 2017-12-30 DIAGNOSIS — I6523 Occlusion and stenosis of bilateral carotid arteries: Secondary | ICD-10-CM

## 2017-12-30 DIAGNOSIS — I10 Essential (primary) hypertension: Secondary | ICD-10-CM | POA: Diagnosis not present

## 2017-12-30 LAB — COMPREHENSIVE METABOLIC PANEL
ALT: 18 U/L (ref 0–44)
AST: 36 U/L (ref 15–41)
Albumin: 3.4 g/dL — ABNORMAL LOW (ref 3.5–5.0)
Alkaline Phosphatase: 99 U/L (ref 38–126)
Anion gap: 14 (ref 5–15)
BUN: 13 mg/dL (ref 8–23)
CO2: 20 mmol/L — ABNORMAL LOW (ref 22–32)
Calcium: 9.2 mg/dL (ref 8.9–10.3)
Chloride: 101 mmol/L (ref 98–111)
Creatinine, Ser: 0.65 mg/dL (ref 0.61–1.24)
GFR calc Af Amer: >60 mL/min (ref >60)
GFR calc non Af Amer: >60 mL/min (ref >60)
Glucose, Bld: 107 mg/dL — ABNORMAL HIGH (ref 70–99)
Potassium: 4.2 mmol/L (ref 3.5–5.1)
Sodium: 135 mmol/L (ref 135–145)
Total Bilirubin: 1.2 mg/dL (ref 0.3–1.2)
Total Protein: 7.5 g/dL (ref 6.5–8.1)

## 2017-12-30 LAB — BLOOD GAS, ARTERIAL
Acid-Base Excess: 1.4 mmol/L (ref 0.0–2.0)
Bicarbonate: 24.9 mmol/L (ref 20.0–28.0)
Drawn by: 470591
FIO2: 21
O2 Saturation: 98.1 %
Patient temperature: 98.6
pCO2 arterial: 35.8 mmHg (ref 32.0–48.0)
pH, Arterial: 7.456 — ABNORMAL HIGH (ref 7.350–7.450)
pO2, Arterial: 109 mmHg — ABNORMAL HIGH (ref 83.0–108.0)

## 2017-12-30 LAB — PULMONARY FUNCTION TEST
DL/VA % pred: 80 %
DL/VA: 3.63 ml/min/mmHg/L
DLCO cor % pred: 69 %
DLCO cor: 20.56 ml/min/mmHg
DLCO unc % pred: 71 %
DLCO unc: 21.17 ml/min/mmHg
FEF 25-75 Post: 1.8 L/sec
FEF 25-75 Pre: 1.48 L/sec
FEF2575-%Change-Post: 21 %
FEF2575-%Pred-Post: 68 %
FEF2575-%Pred-Pre: 56 %
FEV1-%Change-Post: 3 %
FEV1-%Pred-Post: 74 %
FEV1-%Pred-Pre: 71 %
FEV1-Post: 2.42 L
FEV1-Pre: 2.34 L
FEV1FVC-%Change-Post: 0 %
FEV1FVC-%Pred-Pre: 96 %
FEV6-%Change-Post: 2 %
FEV6-%Pred-Post: 79 %
FEV6-%Pred-Pre: 77 %
FEV6-Post: 3.27 L
FEV6-Pre: 3.19 L
FEV6FVC-%Change-Post: 0 %
FEV6FVC-%Pred-Post: 103 %
FEV6FVC-%Pred-Pre: 103 %
FVC-%Change-Post: 2 %
FVC-%Pred-Post: 76 %
FVC-%Pred-Pre: 74 %
FVC-Post: 3.33 L
FVC-Pre: 3.24 L
Post FEV1/FVC ratio: 73 %
Post FEV6/FVC ratio: 98 %
Pre FEV1/FVC ratio: 72 %
Pre FEV6/FVC Ratio: 99 %
RV % pred: 130 %
RV: 2.85 L
TLC % pred: 93 %
TLC: 6.19 L

## 2017-12-30 LAB — URINALYSIS, ROUTINE W REFLEX MICROSCOPIC
Bilirubin Urine: NEGATIVE
Glucose, UA: NEGATIVE mg/dL
Hgb urine dipstick: NEGATIVE
Ketones, ur: NEGATIVE mg/dL
Leukocytes, UA: NEGATIVE
Nitrite: NEGATIVE
Protein, ur: NEGATIVE mg/dL
Specific Gravity, Urine: 1.01 (ref 1.005–1.030)
pH: 6 (ref 5.0–8.0)

## 2017-12-30 LAB — SURGICAL PCR SCREEN
MRSA, PCR: NEGATIVE
Staphylococcus aureus: POSITIVE — AB

## 2017-12-30 LAB — CBC
HCT: 50.1 % (ref 39.0–52.0)
Hemoglobin: 16.3 g/dL (ref 13.0–17.0)
MCH: 27.9 pg (ref 26.0–34.0)
MCHC: 32.5 g/dL (ref 30.0–36.0)
MCV: 85.8 fL (ref 80.0–100.0)
Platelets: 257 10*3/uL (ref 150–400)
RBC: 5.84 MIL/uL — ABNORMAL HIGH (ref 4.22–5.81)
RDW: 30.1 % — ABNORMAL HIGH (ref 11.5–15.5)
WBC: 25 10*3/uL — ABNORMAL HIGH (ref 4.0–10.5)
nRBC: 0.2 % (ref 0.0–0.2)

## 2017-12-30 LAB — HEMOGLOBIN A1C
Hgb A1c MFr Bld: 5.9 % — ABNORMAL HIGH (ref 4.8–5.6)
Mean Plasma Glucose: 122.63 mg/dL

## 2017-12-30 LAB — GLUCOSE, CAPILLARY: Glucose-Capillary: 117 mg/dL — ABNORMAL HIGH (ref 70–99)

## 2017-12-30 LAB — APTT: aPTT: 35 seconds (ref 24–36)

## 2017-12-30 LAB — PROTIME-INR
INR: 1.13
Prothrombin Time: 14.4 seconds (ref 11.4–15.2)

## 2017-12-30 MED ORDER — ALBUTEROL SULFATE (2.5 MG/3ML) 0.083% IN NEBU
2.5000 mg | INHALATION_SOLUTION | Freq: Once | RESPIRATORY_TRACT | Status: AC
  Administered 2017-12-30: 2.5 mg via RESPIRATORY_TRACT

## 2017-12-30 NOTE — Progress Notes (Signed)
Pre CABG vascular ultrasound       has been completed. Preliminary results can be found under CV proc through chart review. Landry Mellow, RDMS, RVT

## 2017-12-30 NOTE — Progress Notes (Signed)
PCR: + for Staph  Pres[facility] [address] @ 0867 on 12-30-17  Pt called, left voicemail.

## 2017-12-30 NOTE — Pre-Procedure Instructions (Signed)
Justin [name]  12/30/2017      [id number] [address] [place] [address] Phone: [phone] Fax: [phone]    Your procedure is scheduled on December 16th.  Report to [facility] Admitting at 5:30 A.M.  Call this number if you have problems the morning of surgery:  [phone]   Remember:  Do not eat or drink after midnight.      Take these medicines the morning of surgery with A SIP OF WATER  atenolol (TENORMIN) cyclobenzaprine (FLEXERIL)  fluticasone (FLONASE) HYDROcodone-acetaminophen (NORCO/VICODIN) if needed loratadine (CLARITIN) hydroxyurea (HYDREA) pregabalin (LYRICA)  FOLLOW PHYSICIANS INSTRUCTIONS ABOUT ELIQUIS AND ASPIRIN  7 days prior to surgery STOP taking any Aleve, Naproxen, Ibuprofen, Motrin, Advil, Goody's, BC's, all herbal medications, fish oil, and all vitamins   WHAT DO I DO ABOUT MY DIABETES MEDICATION?  Marland Kitchen Do not take oral diabetes medicines (pills) the morning of surgery. - Metformin   How to Manage Your Diabetes Before and After Surgery  Why is it important to control my blood sugar before and after surgery? . Improving blood sugar levels before and after surgery helps healing and can limit problems. . A way of improving blood sugar control is eating a healthy diet by: o  Eating less sugar and carbohydrates o  Increasing activity/exercise o  Talking with your doctor about reaching your blood sugar goals . High blood sugars (greater than 180 mg/dL) can raise your risk of infections and slow your recovery, so you will need to focus on controlling your diabetes during the weeks before surgery. . Make sure that the doctor who takes care of your diabetes knows about your planned surgery including the date and location.  How do I manage my blood sugar before surgery? . Check your blood sugar at least 4 times a day, starting 2 days before surgery, to make sure that the level is  not too high or low. o Check your blood sugar the morning of your surgery when you wake up and every 2 hours until you get to the Short Stay unit. . If your blood sugar is less than 70 mg/dL, you will need to treat for low blood sugar: o Do not take insulin. o Treat a low blood sugar (less than 70 mg/dL) with  cup of clear juice (cranberry or apple), 4 glucose tablets, OR glucose gel. o Recheck blood sugar in 15 minutes after treatment (to make sure it is greater than 70 mg/dL). If your blood sugar is not greater than 70 mg/dL on recheck, call [phone] for further instructions. . Report your blood sugar to the short stay nurse when you get to Short Stay.  . If you are admitted to the hospital after surgery: o Your blood sugar will be checked by the staff and you will probably be given insulin after surgery (instead of oral diabetes medicines) to make sure you have good blood sugar levels. o The goal for blood sugar control after surgery is 80-180 mg/dL.     Do not wear jewelry  Do not wear lotions, powders, or cologne, or deodorant.  Men may shave face and neck.  Do not bring valuables to the hospital.  [facility] is not responsible for any belongings or valuables.  Contacts, dentures or bridgework may not be worn into surgery.  Leave your suitcase in the car.  After surgery it may be brought to your room.  For patients admitted to the  hospital, discharge time will be determined by your treatment team.  Patients discharged the day of surgery will not be allowed to drive home.    Special instructions:   [place]- Preparing For Surgery  Before surgery, you can play an important role. Because skin is not sterile, your skin needs to be as free of germs as possible. You can reduce the number of germs on your skin by washing with CHG (chlorahexidine gluconate) Soap before surgery.  CHG is an antiseptic cleaner which kills germs and bonds with the skin to continue killing germs even  after washing.    Oral Hygiene is also important to reduce your risk of infection.  Remember - BRUSH YOUR TEETH THE MORNING OF SURGERY WITH YOUR REGULAR TOOTHPASTE  Please do not use if you have an allergy to CHG or antibacterial soaps. If your skin becomes reddened/irritated stop using the CHG.  Do not shave (including legs and underarms) for at least 48 hours prior to first CHG shower. It is OK to shave your face.  Please follow these instructions carefully.   1. Shower the NIGHT BEFORE SURGERY and the MORNING OF SURGERY with CHG.   2. If you chose to wash your hair, wash your hair first as usual with your normal shampoo.  3. After you shampoo, rinse your hair and body thoroughly to remove the shampoo.  4. Use CHG as you would any other liquid soap. You can apply CHG directly to the skin and wash gently with a scrungie or a clean washcloth.   5. Apply the CHG Soap to your body ONLY FROM THE NECK DOWN.  Do not use on open wounds or open sores. Avoid contact with your eyes, ears, mouth and genitals (private parts). Wash Face and genitals (private parts)  with your normal soap.  6. Wash thoroughly, paying special attention to the area where your surgery will be performed.  7. Thoroughly rinse your body with warm water from the neck down.  8. DO NOT shower/wash with your normal soap after using and rinsing off the CHG Soap.  9. Pat yourself dry with a CLEAN TOWEL.  10. Wear CLEAN PAJAMAS to bed the night before surgery, wear comfortable clothes the morning of surgery  11. Place CLEAN SHEETS on your bed the night of your first shower and DO NOT SLEEP WITH PETS.    Day of Surgery:  Do not apply any deodorants/lotions.  Please wear clean clothes to the hospital/surgery center.   Remember to brush your teeth WITH YOUR REGULAR TOOTHPASTE.    Please read over the following fact sheets that you were given.

## 2017-12-30 NOTE — Progress Notes (Signed)
PCP: [name]  DM: type 2, does not check sugars Pt on Metformin  SA: denies  At PFT test: new results of severe right carotid disease (per Ryan, called during PAT appt)  Pt denies SOB, cough, fever, chest pain  Pt stated understanding of instructions given for DOS.

## 2017-12-30 NOTE — Progress Notes (Signed)
Referring Physician: Dr. Prescott Gum  Patient name: Justin [name] MRN: [id number] DOB: May 26, 1955 Sex: male  REASON FOR CONSULT: Severe bilateral carotid occlusive disease  HPI: Justin [name] is a 62 y.o. male, currently under consideration and scheduled for coronary artery bypass grafting by Dr. Prescott Gum next week.  He was noted on preoperative exam to have occlusion of the left internal carotid artery with greater than 80% stenosis of the right internal carotid artery.  He denies any prior symptoms of TIA amaurosis or stroke.  He does have a history of atrial fibrillation.  He also has a history of borderline myeloproliferative disorder that is currently being observed.  He also has a history of peripheral arterial disease.  He has previously documented ABIs of 0.5 on the right and 0.4 on the left.  He does complain of left leg claudication symptoms at about half a block to 1 block.  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  He does have a history of tobacco abuse.  Other medical problems include lipidemia and hypertension.  These are both currently stable.  He is on Eliquis aspirin and a statin.  Past Medical History:  Diagnosis Date  . Acute meniscal tear of knee LEFT  . Aortic stenosis 12/01/2017   NONRHEUMATIC, AORTIC VALVE CALCIFICATIONS, MILD TO MODERATE REGURG, MILD TO MODERATE CALCIFIED ANNULUS per ECHO 10/25/17 @ [facility]  . Arthritis   . Atrial fibrillation ([place]) 11/12/2017   AT O/V WITH PCP  . Heart murmur MILD-- ASYMPTOMATIC  . Hyperlipidemia   . Hypertension   . Left knee pain    Past Surgical History:  Procedure Laterality Date  . APPENDECTOMY  1998  . CATARACT EXTRACTION W/ INTRAOCULAR LENS  IMPLANT, BILATERAL  1998/  2000  . CERVICAL FUSION  1985   C4 - 5  . KNEE ARTHROSCOPY W/ MENISCECTOMY  1991   LEFT KNEE  . NASAL SINUS SURGERY  1982  . RIGHT/LEFT HEART CATH AND CORONARY ANGIOGRAPHY N/A  12/24/2017   Procedure: RIGHT/LEFT HEART CATH AND CORONARY ANGIOGRAPHY;  Surgeon: Jolaine Artist, MD;  Location: [place];  Service: Cardiovascular;  Laterality: N/A;  . ROTATOR CUFF REPAIR  09-04-2005   LEFT SHOULDER  . TEE WITHOUT CARDIOVERSION N/A 12/24/2017   Procedure: TRANSESOPHAGEAL ECHOCARDIOGRAM (TEE);  Surgeon: Jolaine Artist, MD;  Location: [facility];  Service: Cardiovascular;  Laterality: N/A;  . TONSILLECTOMY  AGE 39    Family History  Adopted: Yes  Problem Relation Age of Onset  . Cancer Father     SOCIAL HISTORY: Social History   Socioeconomic History  . Marital status: Married    Spouse name: Not on file  . Number of children: Not on file  . Years of education: Not on file  . Highest education level: Not on file  Occupational History  . Not on file  Social Needs  . Financial resource strain: Not on file  . Food insecurity:    Worry: Not on file    Inability: Not on file  . Transportation needs:    Medical: Not on file    Non-medical: Not on file  Tobacco Use  . Smoking status: Current Some Day Smoker    Packs/day: 0.25    Years: 23.00    Pack years: 5.75    Types: Cigars  . Smokeless tobacco: Never Used  . Tobacco comment: quit cigs in 2011 and smokes cigars daily- from 3-10 cigars-09/27/13  Substance and Sexual Activity  . Alcohol use: Yes    Comment: OCCASIONAL  . Drug use: No  . Sexual activity: Not on file  Lifestyle  . Physical activity:    Days per week: Not on file    Minutes per session: Not on file  . Stress: Not on file  Relationships  . Social connections:    Talks on phone: Not on file    Gets together: Not on file    Attends religious service: Not on file    Active member of club or organization: Not on file    Attends meetings of clubs or organizations: Not on file    Relationship status: Not on file  . Intimate partner violence:    Fear of current or ex partner: Not on file    Emotionally abused: Not on  file    Physically abused: Not on file    Forced sexual activity: Not on file  Other Topics Concern  . Not on file  Social History Narrative  . Not on file    Allergies  Allergen Reactions  . Codeine Swelling    Tolerates hydrocodone and oxycodone     Current Outpatient Medications  Medication Sig Dispense Refill  . apixaban (ELIQUIS) 5 MG TABS tablet Take 5 mg by mouth 2 (two) times daily.    Marland Kitchen atenolol (TENORMIN) 50 MG tablet Take 50 mg by mouth daily.    . Cholecalciferol 125 MCG (5000 UT) capsule Take 1 capsule by mouth daily.     . cyclobenzaprine (FLEXERIL) 10 MG tablet Take 10 mg by mouth 3 (three) times daily.     . fluticasone (FLONASE) 50 MCG/ACT nasal spray Place 2 sprays into both nostrils daily.    . hydrochlorothiazide (MICROZIDE) 12.5 MG capsule Take 12.5 mg by mouth daily.    Marland Kitchen HYDROcodone-acetaminophen (NORCO/VICODIN) 5-325 MG per tablet Take 1 tablet by mouth every 6 (six) hours as needed for moderate pain.    . hydroxyurea (HYDREA) 500 MG capsule Take 1 capsule (500 mg total) by mouth 2 (two) times daily. May take with food to minimize GI side effects. 60 capsule 1  . loratadine (CLARITIN) 10 MG tablet Take 10 mg by mouth daily.    . meloxicam (MOBIC) 15 MG tablet Take 15 mg by mouth daily.     . metFORMIN (GLUCOPHAGE-XR) 500 MG 24 hr tablet Take 500 mg by mouth daily before supper.    . Multiple Vitamin (MULTIVITAMIN) tablet Take 1 tablet by mouth daily.    . nefazodone (SERZONE) 100 MG tablet Take 100 mg by mouth 2 (two) times daily.    . Omega 3 1200 MG CAPS Take 1 capsule by mouth 2 (two) times daily.    . pregabalin (LYRICA) 50 MG capsule Take 50 mg by mouth 3 (three) times daily.    . simvastatin (ZOCOR) 40 MG tablet Take 40 mg by mouth every evening.    . zolpidem (AMBIEN) 10 MG tablet Take 10 mg by mouth at bedtime as needed.    Marland Kitchen aspirin 81 MG tablet Take 81 mg by mouth daily.      No current facility-administered medications for this visit.     ROS:    General:  No weight loss, Fever, chills  HEENT: No recent headaches, no nasal bleeding, no visual changes, no sore throat  Neurologic: No dizziness, blackouts, seizures. No recent symptoms of stroke or mini- stroke. No recent episodes of slurred speech, or temporary blindness.  Cardiac: No recent  episodes of chest pain/pressure, no shortness of breath at rest.  No shortness of breath with exertion.  + history of atrial fibrillation or irregular heartbeat  Vascular: No history of rest pain in feet.  + history of claudication.  No history of non-healing ulcer, No history of DVT   Pulmonary: No home oxygen, no productive cough, no hemoptysis,  No asthma or wheezing  Musculoskeletal:  [X]  Arthritis, [ ]  Low back pain,  [X]  Joint pain  Hematologic:No history of hypercoagulable state.  No history of easy bleeding.  No history of anemia  Gastrointestinal: No hematochezia or melena,  No gastroesophageal reflux, no trouble swallowing  Urinary: [ ]  chronic Kidney disease, [ ]  on HD - [ ]  MWF or [ ]  TTHS, [ ]  Burning with urination, [ ]  Frequent urination, [ ]  Difficulty urinating;   Skin: No rashes  Psychological: No history of anxiety,  No history of depression   Physical Examination  Vitals:   12/30/17 1324 12/30/17 1328  BP: 117/78 (!) 144/71  Pulse: 97 97  Resp: 18   Temp: (!) 97 F (36.1 C)   TempSrc: Oral   SpO2: 95%   Weight: 197 lb (89.4 kg)   Height: 5\' 8"  (1.727 m)     Body mass index is 29.95 kg/m.  General:  Alert and oriented, no acute distress HEENT: Normal Neck: Soft right carotid bruit no left carotid bruit Pulmonary: Clear to auscultation bilaterally Cardiac: Regular Rate and Rhythm 2/6 systolic ejection murmur heard best over the right second interspace Abdomen: Soft, non-tender, non-distended, no mass Skin: No rash Extremity Pulses:  2+ radial, brachial, 1+ right absent left femoral, and popliteal dorsalis pedis, posterior tibial pulses  bilaterally Musculoskeletal: No deformity or edema  Neurologic: Upper and lower extremity motor 5/5 and symmetric  DATA: I reviewed the patient's carotid duplex exam which shows a velocity of 502/132 on the right side left side was occluded.  Is a significant gradient between the left and right arm pressures with the left arm pressure being 117 compared to 152 on the right.  Is also stenotic flow of the left subclavian   ASSESSMENT: #1 high-grade asymptomatic right internal carotid artery stenosis with contralateral occlusion higher risk for stroke with artery bypass grafting.  Plan will be for right carotid endarterectomy by my partner Dr. Carlis Abbott.  Of note the patient also has a great dent in the left to right arm blood pressure the arterial line should go in the right arm he most likely has significant left subclavian artery stenosis.  Risk benefits possible complications of procedure details regarding right carotid endarterectomy were discussed with the patient and his wife today.  These include but are not limited to stroke risk of 3 to 5%, cranial nerve injury risk of 10%, death related to combined carotid CABG operation.  Infection and bleeding.  He understands and agrees to proceed.  2.  Peripheral arterial disease patient will have further evaluation of this after he has recovered from his coronary artery bypass grafting.  He is not really that debilitated by his symptoms currently.  Plan: See above  Ruta Hinds, MD [facility] Office: [phone] Pager: [phone]

## 2017-12-31 DIAGNOSIS — Z736 Limitation of activities due to disability: Secondary | ICD-10-CM

## 2017-12-31 LAB — ABO/RH: ABO/RH(D): A POS

## 2017-12-31 MED ORDER — MILRINONE LACTATE IN DEXTROSE 20-5 MG/100ML-% IV SOLN
0.3000 ug/kg/min | INTRAVENOUS | Status: AC
  Administered 2018-01-03: 0.125 ug/kg/min via INTRAVENOUS
  Filled 2017-12-31: qty 100

## 2017-12-31 MED ORDER — DOPAMINE-DEXTROSE 3.2-5 MG/ML-% IV SOLN
0.0000 ug/kg/min | INTRAVENOUS | Status: DC
  Filled 2017-12-31: qty 250

## 2017-12-31 MED ORDER — VANCOMYCIN HCL 10 G IV SOLR
1500.0000 mg | INTRAVENOUS | Status: AC
  Administered 2018-01-03: 1500 mg via INTRAVENOUS
  Filled 2017-12-31: qty 1500

## 2017-12-31 MED ORDER — SODIUM CHLORIDE 0.9 % IV SOLN
INTRAVENOUS | Status: DC
  Filled 2017-12-31: qty 30

## 2017-12-31 MED ORDER — NOREPINEPHRINE 4 MG/250ML-% IV SOLN
0.0000 ug/min | INTRAVENOUS | Status: DC
  Administered 2018-01-03: 2 ug/min via INTRAVENOUS
  Filled 2017-12-31 (×2): qty 250

## 2017-12-31 MED ORDER — SODIUM CHLORIDE 0.9 % IV SOLN
1.5000 g | INTRAVENOUS | Status: AC
  Administered 2018-01-03: 1.5 g via INTRAVENOUS
  Filled 2017-12-31: qty 1.5

## 2017-12-31 MED ORDER — EPINEPHRINE PF 1 MG/ML IJ SOLN
0.0000 ug/min | INTRAVENOUS | Status: DC
  Filled 2017-12-31: qty 4

## 2017-12-31 MED ORDER — DEXMEDETOMIDINE HCL IN NACL 400 MCG/100ML IV SOLN
0.1000 ug/kg/h | INTRAVENOUS | Status: DC
  Filled 2017-12-31: qty 100

## 2017-12-31 MED ORDER — NITROGLYCERIN IN D5W 200-5 MCG/ML-% IV SOLN
2.0000 ug/min | INTRAVENOUS | Status: AC
  Administered 2018-01-03: 5 ug/min via INTRAVENOUS
  Filled 2017-12-31: qty 250

## 2017-12-31 MED ORDER — PHENYLEPHRINE HCL-NACL 20-0.9 MG/250ML-% IV SOLN
30.0000 ug/min | INTRAVENOUS | Status: DC
  Filled 2017-12-31: qty 250

## 2017-12-31 MED ORDER — TRANEXAMIC ACID (OHS) PUMP PRIME SOLUTION
2.0000 mg/kg | INTRAVENOUS | Status: DC
  Filled 2017-12-31: qty 1.79

## 2017-12-31 MED ORDER — INSULIN REGULAR(HUMAN) IN NACL 100-0.9 UT/100ML-% IV SOLN
INTRAVENOUS | Status: AC
  Administered 2018-01-03: 1.8 [IU]/h via INTRAVENOUS
  Filled 2017-12-31: qty 100

## 2017-12-31 MED ORDER — MAGNESIUM SULFATE 50 % IJ SOLN
40.0000 meq | INTRAMUSCULAR | Status: DC
  Filled 2017-12-31: qty 9.85

## 2017-12-31 MED ORDER — POTASSIUM CHLORIDE 2 MEQ/ML IV SOLN
80.0000 meq | INTRAVENOUS | Status: DC
  Filled 2017-12-31: qty 40

## 2017-12-31 MED ORDER — TRANEXAMIC ACID 1000 MG/10ML IV SOLN
1.5000 mg/kg/h | INTRAVENOUS | Status: AC
  Administered 2018-01-03: 1.5 mg/kg/h via INTRAVENOUS
  Filled 2017-12-31: qty 25

## 2017-12-31 MED ORDER — PLASMA-LYTE 148 IV SOLN
INTRAVENOUS | Status: AC
  Administered 2018-01-03: 500 mL
  Filled 2017-12-31: qty 2.5

## 2017-12-31 MED ORDER — TRANEXAMIC ACID (OHS) BOLUS VIA INFUSION
15.0000 mg/kg | INTRAVENOUS | Status: AC
  Administered 2018-01-03: 1341 mg via INTRAVENOUS
  Filled 2017-12-31: qty 1341

## 2017-12-31 MED ORDER — SODIUM CHLORIDE 0.9 % IV SOLN
750.0000 mg | INTRAVENOUS | Status: DC
  Filled 2017-12-31 (×2): qty 750

## 2018-01-03 ENCOUNTER — Inpatient Hospital Stay (HOSPITAL_COMMUNITY): Payer: BLUE CROSS/BLUE SHIELD

## 2018-01-03 ENCOUNTER — Inpatient Hospital Stay (HOSPITAL_COMMUNITY): Payer: BLUE CROSS/BLUE SHIELD | Admitting: Physician Assistant

## 2018-01-03 ENCOUNTER — Encounter (HOSPITAL_COMMUNITY)
Admission: RE | Disposition: A | Discharge status: Home / Self Care | Payer: Self-pay | Source: Home / Self Care | Attending: Cardiothoracic Surgery

## 2018-01-03 ENCOUNTER — Encounter (HOSPITAL_COMMUNITY): Payer: Self-pay | Admitting: Surgery

## 2018-01-03 ENCOUNTER — Inpatient Hospital Stay (HOSPITAL_COMMUNITY)
Admission: RE | Admit: 2018-01-03 | Discharge: 2018-01-10 | DRG: 220 | Disposition: A | Discharge status: Home / Self Care | Payer: BLUE CROSS/BLUE SHIELD | Attending: Cardiothoracic Surgery | Admitting: Cardiothoracic Surgery

## 2018-01-03 ENCOUNTER — Inpatient Hospital Stay (HOSPITAL_COMMUNITY): Payer: BLUE CROSS/BLUE SHIELD | Admitting: Anesthesiology

## 2018-01-03 ENCOUNTER — Other Ambulatory Visit: Payer: Self-pay

## 2018-01-03 DIAGNOSIS — Z7984 Long term (current) use of oral hypoglycemic drugs: Secondary | ICD-10-CM

## 2018-01-03 DIAGNOSIS — I482 Chronic atrial fibrillation, unspecified: Secondary | ICD-10-CM | POA: Diagnosis not present

## 2018-01-03 DIAGNOSIS — Z953 Presence of xenogenic heart valve: Secondary | ICD-10-CM | POA: Diagnosis not present

## 2018-01-03 DIAGNOSIS — I10 Essential (primary) hypertension: Secondary | ICD-10-CM | POA: Diagnosis present

## 2018-01-03 DIAGNOSIS — E8770 Fluid overload, unspecified: Secondary | ICD-10-CM | POA: Diagnosis not present

## 2018-01-03 DIAGNOSIS — I4891 Unspecified atrial fibrillation: Secondary | ICD-10-CM | POA: Diagnosis not present

## 2018-01-03 DIAGNOSIS — J9811 Atelectasis: Secondary | ICD-10-CM | POA: Diagnosis not present

## 2018-01-03 DIAGNOSIS — E785 Hyperlipidemia, unspecified: Secondary | ICD-10-CM | POA: Diagnosis present

## 2018-01-03 DIAGNOSIS — I358 Other nonrheumatic aortic valve disorders: Secondary | ICD-10-CM | POA: Diagnosis not present

## 2018-01-03 DIAGNOSIS — I5021 Acute systolic (congestive) heart failure: Secondary | ICD-10-CM | POA: Diagnosis not present

## 2018-01-03 DIAGNOSIS — I6523 Occlusion and stenosis of bilateral carotid arteries: Secondary | ICD-10-CM | POA: Diagnosis present

## 2018-01-03 DIAGNOSIS — Z9841 Cataract extraction status, right eye: Secondary | ICD-10-CM

## 2018-01-03 DIAGNOSIS — D689 Coagulation defect, unspecified: Secondary | ICD-10-CM | POA: Diagnosis not present

## 2018-01-03 DIAGNOSIS — J811 Chronic pulmonary edema: Secondary | ICD-10-CM | POA: Diagnosis not present

## 2018-01-03 DIAGNOSIS — Z6830 Body mass index (BMI) 30.0-30.9, adult: Secondary | ICD-10-CM | POA: Diagnosis not present

## 2018-01-03 DIAGNOSIS — Z961 Presence of intraocular lens: Secondary | ICD-10-CM | POA: Diagnosis present

## 2018-01-03 DIAGNOSIS — E669 Obesity, unspecified: Secondary | ICD-10-CM | POA: Diagnosis present

## 2018-01-03 DIAGNOSIS — M47816 Spondylosis without myelopathy or radiculopathy, lumbar region: Secondary | ICD-10-CM | POA: Diagnosis present

## 2018-01-03 DIAGNOSIS — F17290 Nicotine dependence, other tobacco product, uncomplicated: Secondary | ICD-10-CM | POA: Diagnosis present

## 2018-01-03 DIAGNOSIS — R110 Nausea: Secondary | ICD-10-CM | POA: Diagnosis not present

## 2018-01-03 DIAGNOSIS — E872 Acidosis: Secondary | ICD-10-CM | POA: Diagnosis not present

## 2018-01-03 DIAGNOSIS — R011 Cardiac murmur, unspecified: Secondary | ICD-10-CM | POA: Diagnosis present

## 2018-01-03 DIAGNOSIS — Z452 Encounter for adjustment and management of vascular access device: Secondary | ICD-10-CM

## 2018-01-03 DIAGNOSIS — I97190 Other postprocedural cardiac functional disturbances following cardiac surgery: Secondary | ICD-10-CM | POA: Diagnosis not present

## 2018-01-03 DIAGNOSIS — Z885 Allergy status to narcotic agent status: Secondary | ICD-10-CM | POA: Diagnosis not present

## 2018-01-03 DIAGNOSIS — I2510 Atherosclerotic heart disease of native coronary artery without angina pectoris: Secondary | ICD-10-CM | POA: Diagnosis not present

## 2018-01-03 DIAGNOSIS — M1990 Unspecified osteoarthritis, unspecified site: Secondary | ICD-10-CM | POA: Diagnosis not present

## 2018-01-03 DIAGNOSIS — D72829 Elevated white blood cell count, unspecified: Secondary | ICD-10-CM | POA: Diagnosis not present

## 2018-01-03 DIAGNOSIS — R490 Dysphonia: Secondary | ICD-10-CM | POA: Diagnosis not present

## 2018-01-03 DIAGNOSIS — Z791 Long term (current) use of non-steroidal anti-inflammatories (NSAID): Secondary | ICD-10-CM

## 2018-01-03 DIAGNOSIS — Z79899 Other long term (current) drug therapy: Secondary | ICD-10-CM

## 2018-01-03 DIAGNOSIS — Z7901 Long term (current) use of anticoagulants: Secondary | ICD-10-CM

## 2018-01-03 DIAGNOSIS — Z951 Presence of aortocoronary bypass graft: Secondary | ICD-10-CM | POA: Diagnosis not present

## 2018-01-03 DIAGNOSIS — D62 Acute posthemorrhagic anemia: Secondary | ICD-10-CM | POA: Diagnosis not present

## 2018-01-03 DIAGNOSIS — Z981 Arthrodesis status: Secondary | ICD-10-CM

## 2018-01-03 DIAGNOSIS — Z419 Encounter for procedure for purposes other than remedying health state, unspecified: Secondary | ICD-10-CM

## 2018-01-03 DIAGNOSIS — J90 Pleural effusion, not elsewhere classified: Secondary | ICD-10-CM | POA: Diagnosis not present

## 2018-01-03 DIAGNOSIS — Z9842 Cataract extraction status, left eye: Secondary | ICD-10-CM

## 2018-01-03 DIAGNOSIS — E1151 Type 2 diabetes mellitus with diabetic peripheral angiopathy without gangrene: Secondary | ICD-10-CM | POA: Diagnosis present

## 2018-01-03 DIAGNOSIS — I480 Paroxysmal atrial fibrillation: Secondary | ICD-10-CM | POA: Diagnosis not present

## 2018-01-03 DIAGNOSIS — J449 Chronic obstructive pulmonary disease, unspecified: Secondary | ICD-10-CM | POA: Diagnosis present

## 2018-01-03 DIAGNOSIS — Z4682 Encounter for fitting and adjustment of non-vascular catheter: Secondary | ICD-10-CM | POA: Diagnosis not present

## 2018-01-03 DIAGNOSIS — I6521 Occlusion and stenosis of right carotid artery: Secondary | ICD-10-CM | POA: Diagnosis not present

## 2018-01-03 DIAGNOSIS — I350 Nonrheumatic aortic (valve) stenosis: Secondary | ICD-10-CM | POA: Diagnosis not present

## 2018-01-03 DIAGNOSIS — Z954 Presence of other heart-valve replacement: Secondary | ICD-10-CM | POA: Diagnosis not present

## 2018-01-03 DIAGNOSIS — R918 Other nonspecific abnormal finding of lung field: Secondary | ICD-10-CM | POA: Diagnosis not present

## 2018-01-03 DIAGNOSIS — Z952 Presence of prosthetic heart valve: Secondary | ICD-10-CM

## 2018-01-03 DIAGNOSIS — D45 Polycythemia vera: Secondary | ICD-10-CM | POA: Diagnosis present

## 2018-01-03 DIAGNOSIS — I2583 Coronary atherosclerosis due to lipid rich plaque: Secondary | ICD-10-CM | POA: Diagnosis not present

## 2018-01-03 DIAGNOSIS — Z7982 Long term (current) use of aspirin: Secondary | ICD-10-CM

## 2018-01-03 DIAGNOSIS — I080 Rheumatic disorders of both mitral and aortic valves: Secondary | ICD-10-CM | POA: Diagnosis not present

## 2018-01-03 DIAGNOSIS — E876 Hypokalemia: Secondary | ICD-10-CM | POA: Diagnosis not present

## 2018-01-03 HISTORY — PX: AORTIC VALVE REPLACEMENT: SHX41

## 2018-01-03 HISTORY — PX: TEE WITHOUT CARDIOVERSION: SHX5443

## 2018-01-03 HISTORY — PX: ENDARTERECTOMY: SHX5162

## 2018-01-03 HISTORY — PX: CORONARY ARTERY BYPASS GRAFT: SHX141

## 2018-01-03 HISTORY — DX: Type 2 diabetes mellitus without complications: E11.9

## 2018-01-03 HISTORY — DX: Peripheral vascular disease, unspecified: I73.9

## 2018-01-03 LAB — POCT I-STAT 3, ART BLOOD GAS (G3+)
Acid-base deficit: 2 mmol/L (ref 0.0–2.0)
Acid-base deficit: 7 mmol/L — ABNORMAL HIGH (ref 0.0–2.0)
Acid-base deficit: 2 mmol/L (ref 0.0–2.0)
Acid-base deficit: 3 mmol/L — ABNORMAL HIGH (ref 0.0–2.0)
Acid-base deficit: 2 mmol/L (ref 0.0–2.0)
Bicarbonate: 21 mmol/L (ref 20.0–28.0)
Bicarbonate: 23.9 mmol/L (ref 20.0–28.0)
Bicarbonate: 24.6 mmol/L (ref 20.0–28.0)
Bicarbonate: 26.6 mmol/L (ref 20.0–28.0)
Bicarbonate: 24.4 mmol/L (ref 20.0–28.0)
O2 Saturation: 95 %
O2 Saturation: 97 %
O2 Saturation: 92 %
O2 Saturation: 92 %
O2 Saturation: 92 %
Patient temperature: 36.8
Patient temperature: 36.9
Patient temperature: 36.8
TCO2: 23 mmol/L (ref 22–32)
TCO2: 25 mmol/L (ref 22–32)
TCO2: 26 mmol/L (ref 22–32)
TCO2: 29 mmol/L (ref 22–32)
TCO2: 26 mmol/L (ref 22–32)
pCO2 arterial: 46.5 mmHg (ref 32.0–48.0)
pCO2 arterial: 53.2 mmHg — ABNORMAL HIGH (ref 32.0–48.0)
pCO2 arterial: 55.4 mmHg — ABNORMAL HIGH (ref 32.0–48.0)
pCO2 arterial: 67.5 mmHg (ref 32.0–48.0)
pCO2 arterial: 48 mmHg (ref 32.0–48.0)
pH, Arterial: 7.204 — ABNORMAL LOW (ref 7.350–7.450)
pH, Arterial: 7.319 — ABNORMAL LOW (ref 7.350–7.450)
pH, Arterial: 7.202 — ABNORMAL LOW (ref 7.350–7.450)
pH, Arterial: 7.255 — ABNORMAL LOW (ref 7.350–7.450)
pH, Arterial: 7.313 — ABNORMAL LOW (ref 7.350–7.450)
pO2, Arterial: 102 mmHg (ref 83.0–108.0)
pO2, Arterial: 93 mmHg (ref 83.0–108.0)
pO2, Arterial: 76 mmHg — ABNORMAL LOW (ref 83.0–108.0)
pO2, Arterial: 81 mmHg — ABNORMAL LOW (ref 83.0–108.0)
pO2, Arterial: 71 mmHg — ABNORMAL LOW (ref 83.0–108.0)

## 2018-01-03 LAB — GLUCOSE, CAPILLARY
Glucose-Capillary: 119 mg/dL — ABNORMAL HIGH (ref 70–99)
Glucose-Capillary: 105 mg/dL — ABNORMAL HIGH (ref 70–99)
Glucose-Capillary: 117 mg/dL — ABNORMAL HIGH (ref 70–99)
Glucose-Capillary: 108 mg/dL — ABNORMAL HIGH (ref 70–99)
Glucose-Capillary: 109 mg/dL — ABNORMAL HIGH (ref 70–99)

## 2018-01-03 LAB — POCT I-STAT, CHEM 8
BUN: 7 mg/dL — ABNORMAL LOW (ref 8–23)
Calcium, Ion: 1.27 mmol/L (ref 1.15–1.40)
Chloride: 104 mmol/L (ref 98–111)
Creatinine, Ser: 0.7 mg/dL (ref 0.61–1.24)
Glucose, Bld: 130 mg/dL — ABNORMAL HIGH (ref 70–99)
HCT: 31 % — ABNORMAL LOW (ref 39.0–52.0)
Hemoglobin: 10.5 g/dL — ABNORMAL LOW (ref 13.0–17.0)
Potassium: 4 mmol/L (ref 3.5–5.1)
Sodium: 137 mmol/L (ref 135–145)
TCO2: 22 mmol/L (ref 22–32)

## 2018-01-03 LAB — PROTIME-INR
INR: 1
Prothrombin Time: 13.1 seconds (ref 11.4–15.2)

## 2018-01-03 LAB — CBC
HEMATOCRIT: 31 % — AB (ref 39.0–52.0)
HEMOGLOBIN: 10 g/dL — AB (ref 13.0–17.0)
MCH: 28.6 pg (ref 26.0–34.0)
MCHC: 32.3 g/dL (ref 30.0–36.0)
MCV: 88.6 fL (ref 80.0–100.0)
Platelets: 144 10*3/uL — ABNORMAL LOW (ref 150–400)
RBC: 3.5 MIL/uL — AB (ref 4.22–5.81)
RDW: 27.4 % — ABNORMAL HIGH (ref 11.5–15.5)
WBC: 24.9 10*3/uL — ABNORMAL HIGH (ref 4.0–10.5)
nRBC: 0.3 % — ABNORMAL HIGH (ref 0.0–0.2)

## 2018-01-03 LAB — FIBRINOGEN: Fibrinogen: 289 mg/dL (ref 210–475)

## 2018-01-03 LAB — PLATELET COUNT: Platelets: 151 10*3/uL (ref 150–400)

## 2018-01-03 LAB — PREPARE RBC (CROSSMATCH)

## 2018-01-03 LAB — HEMOGLOBIN AND HEMATOCRIT, BLOOD
HCT: 27.7 % — ABNORMAL LOW (ref 39.0–52.0)
Hemoglobin: 9 g/dL — ABNORMAL LOW (ref 13.0–17.0)

## 2018-01-03 LAB — APTT: aPTT: 44 seconds — ABNORMAL HIGH (ref 24–36)

## 2018-01-03 SURGERY — REPLACEMENT, AORTIC VALVE, OPEN
Anesthesia: General | Site: Chest | Laterality: Right

## 2018-01-03 MED ORDER — ACETAMINOPHEN 650 MG RE SUPP
650.0000 mg | Freq: Once | RECTAL | Status: AC
  Administered 2018-01-03: 650 mg via RECTAL

## 2018-01-03 MED ORDER — FLUTICASONE PROPIONATE 50 MCG/ACT NA SUSP
2.0000 | Freq: Every day | NASAL | Status: DC
  Administered 2018-01-05 – 2018-01-10 (×6): 2 via NASAL
  Filled 2018-01-03: qty 16

## 2018-01-03 MED ORDER — PROPOFOL 10 MG/ML IV BOLUS
INTRAVENOUS | Status: DC | PRN
  Administered 2018-01-03: 30 mg via INTRAVENOUS
  Administered 2018-01-03: 90 mg via INTRAVENOUS

## 2018-01-03 MED ORDER — ROCURONIUM BROMIDE 50 MG/5ML IV SOSY
PREFILLED_SYRINGE | INTRAVENOUS | Status: AC
  Filled 2018-01-03: qty 5

## 2018-01-03 MED ORDER — CEFAZOLIN SODIUM-DEXTROSE 2-4 GM/100ML-% IV SOLN
2.0000 g | INTRAVENOUS | Status: DC
  Filled 2018-01-03: qty 100

## 2018-01-03 MED ORDER — SODIUM CHLORIDE 0.9 % IV SOLN
INTRAVENOUS | Status: DC | PRN
  Administered 2018-01-03 (×2): 750 mg via INTRAVENOUS

## 2018-01-03 MED ORDER — ATORVASTATIN CALCIUM 10 MG PO TABS
20.0000 mg | ORAL_TABLET | Freq: Every day | ORAL | Status: DC
  Administered 2018-01-04 – 2018-01-07 (×4): 20 mg via ORAL
  Administered 2018-01-08: 10 mg via ORAL
  Administered 2018-01-09: 20 mg via ORAL
  Filled 2018-01-03 (×6): qty 2

## 2018-01-03 MED ORDER — ACETAMINOPHEN 160 MG/5ML PO SOLN
1000.0000 mg | Freq: Four times a day (QID) | ORAL | Status: AC
  Administered 2018-01-04 (×2): 1000 mg
  Filled 2018-01-03 (×2): qty 40.6

## 2018-01-03 MED ORDER — CALCIUM CHLORIDE 10 % IV SOLN
INTRAVENOUS | Status: AC
  Filled 2018-01-03: qty 10

## 2018-01-03 MED ORDER — VECURONIUM BROMIDE 10 MG IV SOLR
INTRAVENOUS | Status: DC | PRN
  Administered 2018-01-03: 3 mg via INTRAVENOUS
  Administered 2018-01-03: 2 mg via INTRAVENOUS
  Administered 2018-01-03: 3 mg via INTRAVENOUS
  Administered 2018-01-03: 5 mg via INTRAVENOUS
  Administered 2018-01-03: 2 mg via INTRAVENOUS
  Administered 2018-01-03: 5 mg via INTRAVENOUS

## 2018-01-03 MED ORDER — AMIODARONE HCL IN DEXTROSE 360-4.14 MG/200ML-% IV SOLN
60.0000 mg/h | INTRAVENOUS | Status: AC
  Administered 2018-01-03: 30 mg/h via INTRAVENOUS
  Filled 2018-01-03: qty 200

## 2018-01-03 MED ORDER — PHENYLEPHRINE HCL-NACL 20-0.9 MG/250ML-% IV SOLN
0.0000 ug/min | INTRAVENOUS | Status: DC
  Administered 2018-01-04: 75 ug/min via INTRAVENOUS
  Administered 2018-01-04: 70 ug/min via INTRAVENOUS
  Filled 2018-01-03 (×2): qty 250

## 2018-01-03 MED ORDER — 0.9 % SODIUM CHLORIDE (POUR BTL) OPTIME
TOPICAL | Status: DC | PRN
  Administered 2018-01-03: 5000 mL

## 2018-01-03 MED ORDER — METOCLOPRAMIDE HCL 5 MG/ML IJ SOLN
10.0000 mg | Freq: Four times a day (QID) | INTRAMUSCULAR | Status: AC
  Administered 2018-01-03 – 2018-01-07 (×15): 10 mg via INTRAVENOUS
  Filled 2018-01-03 (×17): qty 2

## 2018-01-03 MED ORDER — SIMVASTATIN 20 MG PO TABS: 40.0000 mg | ORAL_TABLET | Freq: Every evening | ORAL | Status: DC

## 2018-01-03 MED ORDER — CHLORHEXIDINE GLUCONATE 0.12 % MT SOLN
15.0000 mL | OROMUCOSAL | Status: AC
  Administered 2018-01-03: 15 mL via OROMUCOSAL

## 2018-01-03 MED ORDER — PROTAMINE SULFATE 10 MG/ML IV SOLN
INTRAVENOUS | Status: AC
  Filled 2018-01-03: qty 5

## 2018-01-03 MED ORDER — LACTATED RINGERS IV SOLN: INTRAVENOUS | Status: DC

## 2018-01-03 MED ORDER — DEXMEDETOMIDINE HCL IN NACL 200 MCG/50ML IV SOLN
0.0000 ug/kg/h | INTRAVENOUS | Status: DC
  Administered 2018-01-03 – 2018-01-04 (×3): 0.7 ug/kg/h via INTRAVENOUS
  Filled 2018-01-03 (×3): qty 50

## 2018-01-03 MED ORDER — TRAMADOL HCL 50 MG PO TABS
50.0000 mg | ORAL_TABLET | ORAL | Status: DC | PRN
  Administered 2018-01-04 – 2018-01-05 (×3): 100 mg via ORAL
  Administered 2018-01-05: 50 mg via ORAL
  Administered 2018-01-06: 100 mg via ORAL
  Administered 2018-01-06 – 2018-01-08 (×2): 50 mg via ORAL
  Administered 2018-01-08 – 2018-01-10 (×6): 100 mg via ORAL
  Filled 2018-01-03 (×2): qty 2
  Filled 2018-01-03: qty 1
  Filled 2018-01-03 (×4): qty 2
  Filled 2018-01-03 (×2): qty 1
  Filled 2018-01-03 (×5): qty 2

## 2018-01-03 MED ORDER — MORPHINE SULFATE (PF) 2 MG/ML IV SOLN
1.0000 mg | INTRAVENOUS | Status: DC | PRN
  Administered 2018-01-03 – 2018-01-04 (×6): 2 mg via INTRAVENOUS
  Filled 2018-01-03 (×6): qty 1

## 2018-01-03 MED ORDER — SODIUM CHLORIDE 0.9% FLUSH
3.0000 mL | INTRAVENOUS | Status: DC | PRN
  Administered 2018-01-06: 3 mL via INTRAVENOUS
  Filled 2018-01-03: qty 3

## 2018-01-03 MED ORDER — METOPROLOL TARTRATE 5 MG/5ML IV SOLN: 2.5000 mg | INTRAVENOUS | Status: DC | PRN

## 2018-01-03 MED ORDER — PROTAMINE SULFATE 10 MG/ML IV SOLN
INTRAVENOUS | Status: DC | PRN
  Administered 2018-01-03 (×4): 50 mg via INTRAVENOUS
  Administered 2018-01-03: 25 mg via INTRAVENOUS
  Administered 2018-01-03 (×2): 50 mg via INTRAVENOUS
  Administered 2018-01-03: 25 mg via INTRAVENOUS

## 2018-01-03 MED ORDER — SUCCINYLCHOLINE CHLORIDE 200 MG/10ML IV SOSY
PREFILLED_SYRINGE | INTRAVENOUS | Status: AC
  Filled 2018-01-03: qty 10

## 2018-01-03 MED ORDER — PHENYLEPHRINE 40 MCG/ML (10ML) SYRINGE FOR IV PUSH (FOR BLOOD PRESSURE SUPPORT)
PREFILLED_SYRINGE | INTRAVENOUS | Status: AC
  Filled 2018-01-03: qty 10

## 2018-01-03 MED ORDER — SODIUM CHLORIDE 0.9% FLUSH
3.0000 mL | Freq: Two times a day (BID) | INTRAVENOUS | Status: DC
  Administered 2018-01-04 – 2018-01-08 (×7): 3 mL via INTRAVENOUS

## 2018-01-03 MED ORDER — BISACODYL 5 MG PO TBEC
10.0000 mg | DELAYED_RELEASE_TABLET | Freq: Every day | ORAL | Status: DC
  Administered 2018-01-05 – 2018-01-09 (×4): 10 mg via ORAL
  Filled 2018-01-03 (×6): qty 2

## 2018-01-03 MED ORDER — SODIUM CHLORIDE 0.9 % IV SOLN
INTRAVENOUS | Status: AC
  Filled 2018-01-03: qty 1.2

## 2018-01-03 MED ORDER — DOCUSATE SODIUM 100 MG PO CAPS
200.0000 mg | ORAL_CAPSULE | Freq: Every day | ORAL | Status: DC
  Administered 2018-01-05 – 2018-01-09 (×4): 200 mg via ORAL
  Filled 2018-01-03 (×5): qty 2

## 2018-01-03 MED ORDER — INSULIN REGULAR(HUMAN) IN NACL 100-0.9 UT/100ML-% IV SOLN
INTRAVENOUS | Status: DC
  Administered 2018-01-04: 1.5 [IU]/h via INTRAVENOUS
  Filled 2018-01-03: qty 100

## 2018-01-03 MED ORDER — PROTAMINE SULFATE 10 MG/ML IV SOLN
INTRAVENOUS | Status: AC
  Filled 2018-01-03: qty 25

## 2018-01-03 MED ORDER — COAGULATION FACTOR VIIA RECOMB 1 MG IV SOLR
45.0000 ug/kg | INTRAVENOUS | Status: DC
  Filled 2018-01-03: qty 4

## 2018-01-03 MED ORDER — VANCOMYCIN HCL IN DEXTROSE 1-5 GM/200ML-% IV SOLN
1000.0000 mg | Freq: Two times a day (BID) | INTRAVENOUS | Status: AC
  Administered 2018-01-04 (×3): 1000 mg via INTRAVENOUS
  Filled 2018-01-03 (×3): qty 200

## 2018-01-03 MED ORDER — HEPARIN SODIUM (PORCINE) 1000 UNIT/ML IJ SOLN
INTRAMUSCULAR | Status: AC
  Filled 2018-01-03: qty 1

## 2018-01-03 MED ORDER — HEMOSTATIC AGENTS (NO CHARGE) OPTIME
TOPICAL | Status: DC | PRN
  Administered 2018-01-03: 1 via TOPICAL

## 2018-01-03 MED ORDER — LIDOCAINE HCL (PF) 1 % IJ SOLN
INTRAMUSCULAR | Status: AC
  Filled 2018-01-03: qty 30

## 2018-01-03 MED ORDER — THROMBIN (RECOMBINANT) 20000 UNITS EX SOLR
CUTANEOUS | Status: AC
  Filled 2018-01-03: qty 20000

## 2018-01-03 MED ORDER — COAGULATION FACTOR VIIA RECOMB 1 MG IV SOLR
45.0000 ug/kg | INTRAVENOUS | Status: AC
  Administered 2018-01-03: 4 mg via INTRAVENOUS
  Filled 2018-01-03: qty 4

## 2018-01-03 MED ORDER — HEPARIN SODIUM (PORCINE) 1000 UNIT/ML IJ SOLN
INTRAMUSCULAR | Status: AC
  Filled 2018-01-03: qty 2

## 2018-01-03 MED ORDER — MIDAZOLAM HCL (PF) 10 MG/2ML IJ SOLN
INTRAMUSCULAR | Status: AC
  Filled 2018-01-03: qty 2

## 2018-01-03 MED ORDER — EPHEDRINE SULFATE 50 MG/ML IJ SOLN
INTRAMUSCULAR | Status: DC | PRN
  Administered 2018-01-03: 2.5 mg via INTRAVENOUS
  Administered 2018-01-03 (×6): 5 mg via INTRAVENOUS
  Administered 2018-01-03: 2.5 mg via INTRAVENOUS

## 2018-01-03 MED ORDER — PLASMA-LYTE 148 IV SOLN
INTRAVENOUS | Status: AC
  Administered 2018-01-03: 500 mL
  Filled 2018-01-03: qty 2.5

## 2018-01-03 MED ORDER — HEMOSTATIC AGENTS (NO CHARGE) OPTIME
TOPICAL | Status: DC | PRN
  Administered 2018-01-03 (×6): 1 via TOPICAL

## 2018-01-03 MED ORDER — CHLORHEXIDINE GLUCONATE CLOTH 2 % EX PADS: 6.0000 | MEDICATED_PAD | Freq: Once | CUTANEOUS | Status: DC

## 2018-01-03 MED ORDER — SODIUM CHLORIDE 0.9 % IV SOLN
INTRAVENOUS | Status: DC | PRN
  Administered 2018-01-03: 25 ug/min via INTRAVENOUS

## 2018-01-03 MED ORDER — ACETAMINOPHEN 160 MG/5ML PO SOLN: 650.0000 mg | Freq: Once | ORAL | Status: AC

## 2018-01-03 MED ORDER — OMEGA 3 1200 MG PO CAPS: 1.0000 | ORAL_CAPSULE | Freq: Two times a day (BID) | ORAL | Status: DC

## 2018-01-03 MED ORDER — METOPROLOL TARTRATE 25 MG/10 ML ORAL SUSPENSION: 12.5000 mg | Freq: Two times a day (BID) | ORAL | Status: DC

## 2018-01-03 MED ORDER — SODIUM CHLORIDE 0.9 % IV SOLN
250.0000 mL | INTRAVENOUS | Status: DC
  Administered 2018-01-04: 250 mL via INTRAVENOUS

## 2018-01-03 MED ORDER — ALBUMIN HUMAN 5 % IV SOLN
INTRAVENOUS | Status: DC | PRN
  Administered 2018-01-03 (×4): via INTRAVENOUS

## 2018-01-03 MED ORDER — ASPIRIN 81 MG PO CHEW
324.0000 mg | CHEWABLE_TABLET | Freq: Every day | ORAL | Status: DC
  Filled 2018-01-03: qty 4

## 2018-01-03 MED ORDER — MIDAZOLAM HCL 5 MG/5ML IJ SOLN
INTRAMUSCULAR | Status: DC | PRN
  Administered 2018-01-03 (×3): 2 mg via INTRAVENOUS
  Administered 2018-01-03: 1 mg via INTRAVENOUS
  Administered 2018-01-03: 3 mg via INTRAVENOUS

## 2018-01-03 MED ORDER — SODIUM BICARBONATE 8.4 % IV SOLN
INTRAVENOUS | Status: AC
  Filled 2018-01-03: qty 50

## 2018-01-03 MED ORDER — LACTATED RINGERS IV SOLN: 500.0000 mL | Freq: Once | INTRAVENOUS | Status: DC | PRN

## 2018-01-03 MED ORDER — MILRINONE LACTATE IN DEXTROSE 20-5 MG/100ML-% IV SOLN: 0.1250 ug/kg/min | INTRAVENOUS | Status: DC

## 2018-01-03 MED ORDER — CHLORHEXIDINE GLUCONATE 0.12 % MT SOLN
15.0000 mL | Freq: Once | OROMUCOSAL | Status: AC
  Administered 2018-01-03: 15 mL via OROMUCOSAL
  Filled 2018-01-03: qty 15

## 2018-01-03 MED ORDER — INSULIN REGULAR BOLUS VIA INFUSION
0.0000 [IU] | Freq: Three times a day (TID) | INTRAVENOUS | Status: DC
  Filled 2018-01-03: qty 10

## 2018-01-03 MED ORDER — ROCURONIUM BROMIDE 100 MG/10ML IV SOLN
INTRAVENOUS | Status: DC | PRN
  Administered 2018-01-03: 30 mg via INTRAVENOUS
  Administered 2018-01-03: 50 mg via INTRAVENOUS
  Administered 2018-01-03: 20 mg via INTRAVENOUS
  Administered 2018-01-03: 50 mg via INTRAVENOUS

## 2018-01-03 MED ORDER — FENTANYL CITRATE (PF) 100 MCG/2ML IJ SOLN
INTRAMUSCULAR | Status: DC | PRN
  Administered 2018-01-03 (×2): 100 ug via INTRAVENOUS
  Administered 2018-01-03 (×2): 50 ug via INTRAVENOUS
  Administered 2018-01-03: 150 ug via INTRAVENOUS
  Administered 2018-01-03: 100 ug via INTRAVENOUS
  Administered 2018-01-03 (×2): 50 ug via INTRAVENOUS
  Administered 2018-01-03 (×2): 100 ug via INTRAVENOUS
  Administered 2018-01-03: 150 ug via INTRAVENOUS
  Administered 2018-01-03: 50 ug via INTRAVENOUS

## 2018-01-03 MED ORDER — SUCCINYLCHOLINE CHLORIDE 20 MG/ML IJ SOLN
INTRAMUSCULAR | Status: DC | PRN
  Administered 2018-01-03: 160 mg via INTRAVENOUS

## 2018-01-03 MED ORDER — NEFAZODONE HCL 100 MG PO TABS
100.0000 mg | ORAL_TABLET | Freq: Two times a day (BID) | ORAL | Status: DC
  Administered 2018-01-04 – 2018-01-05 (×2): 100 mg via ORAL
  Filled 2018-01-03 (×4): qty 1

## 2018-01-03 MED ORDER — ONDANSETRON HCL 4 MG/2ML IJ SOLN
INTRAMUSCULAR | Status: AC
  Filled 2018-01-03: qty 2

## 2018-01-03 MED ORDER — SODIUM CHLORIDE 0.9 % IV SOLN
INTRAVENOUS | Status: DC | PRN
  Administered 2018-01-03: 17:00:00 via INTRAVENOUS

## 2018-01-03 MED ORDER — HEMOSTATIC AGENTS (NO CHARGE) OPTIME
TOPICAL | Status: DC | PRN
  Administered 2018-01-03 (×2): 2 via TOPICAL
  Administered 2018-01-03: 1 via TOPICAL

## 2018-01-03 MED ORDER — ONDANSETRON HCL 4 MG/2ML IJ SOLN
4.0000 mg | Freq: Four times a day (QID) | INTRAMUSCULAR | Status: DC | PRN
  Administered 2018-01-07: 4 mg via INTRAVENOUS
  Filled 2018-01-03: qty 2

## 2018-01-03 MED ORDER — SODIUM CHLORIDE 0.9 % IV SOLN
750.0000 mg | INTRAVENOUS | Status: DC
  Filled 2018-01-03: qty 750

## 2018-01-03 MED ORDER — MILRINONE LACTATE IN DEXTROSE 20-5 MG/100ML-% IV SOLN
0.1250 ug/kg/min | INTRAVENOUS | Status: DC
  Administered 2018-01-04 – 2018-01-05 (×3): 0.25 ug/kg/min via INTRAVENOUS
  Administered 2018-01-06: 0.125 ug/kg/min via INTRAVENOUS
  Filled 2018-01-03 (×4): qty 100

## 2018-01-03 MED ORDER — CHLORHEXIDINE GLUCONATE 4 % EX LIQD: 30.0000 mL | CUTANEOUS | Status: DC

## 2018-01-03 MED ORDER — LACTATED RINGERS IV SOLN
INTRAVENOUS | Status: DC | PRN
  Administered 2018-01-03 (×5): via INTRAVENOUS

## 2018-01-03 MED ORDER — PROPOFOL 10 MG/ML IV BOLUS
INTRAVENOUS | Status: AC
  Filled 2018-01-03: qty 20

## 2018-01-03 MED ORDER — METOPROLOL TARTRATE 12.5 MG HALF TABLET
12.5000 mg | ORAL_TABLET | Freq: Once | ORAL | Status: AC
  Administered 2018-01-03: 12.5 mg via ORAL
  Filled 2018-01-03: qty 1

## 2018-01-03 MED ORDER — FAMOTIDINE IN NACL 20-0.9 MG/50ML-% IV SOLN
20.0000 mg | Freq: Two times a day (BID) | INTRAVENOUS | Status: AC
  Administered 2018-01-03: 20 mg via INTRAVENOUS

## 2018-01-03 MED ORDER — ZOLPIDEM TARTRATE 5 MG PO TABS
10.0000 mg | ORAL_TABLET | Freq: Every evening | ORAL | Status: DC | PRN
  Administered 2018-01-04 – 2018-01-08 (×4): 10 mg via ORAL
  Filled 2018-01-03 (×4): qty 2

## 2018-01-03 MED ORDER — SODIUM CHLORIDE 0.9 % IV SOLN
INTRAVENOUS | Status: DC
  Administered 2018-01-03: 19:00:00 via INTRAVENOUS

## 2018-01-03 MED ORDER — BISACODYL 10 MG RE SUPP
10.0000 mg | Freq: Every day | RECTAL | Status: DC
  Filled 2018-01-03: qty 1

## 2018-01-03 MED ORDER — SODIUM CHLORIDE 0.45 % IV SOLN: INTRAVENOUS | Status: DC | PRN

## 2018-01-03 MED ORDER — AMIODARONE HCL IN DEXTROSE 360-4.14 MG/200ML-% IV SOLN
30.0000 mg/h | INTRAVENOUS | Status: AC
  Administered 2018-01-04 – 2018-01-05 (×4): 30 mg/h via INTRAVENOUS
  Filled 2018-01-03 (×3): qty 200

## 2018-01-03 MED ORDER — SODIUM CHLORIDE 0.9 % IV SOLN
20.0000 ug | INTRAVENOUS | Status: AC
  Administered 2018-01-03: 20 ug via INTRAVENOUS
  Filled 2018-01-03: qty 5

## 2018-01-03 MED ORDER — ASPIRIN EC 325 MG PO TBEC
325.0000 mg | DELAYED_RELEASE_TABLET | Freq: Every day | ORAL | Status: DC
  Administered 2018-01-04 – 2018-01-09 (×6): 325 mg via ORAL
  Filled 2018-01-03 (×6): qty 1

## 2018-01-03 MED ORDER — VECURONIUM BROMIDE 10 MG IV SOLR
INTRAVENOUS | Status: AC
  Filled 2018-01-03: qty 20

## 2018-01-03 MED ORDER — POTASSIUM CHLORIDE 10 MEQ/50ML IV SOLN
10.0000 meq | INTRAVENOUS | Status: AC
  Administered 2018-01-03 (×3): 10 meq via INTRAVENOUS

## 2018-01-03 MED ORDER — MAGNESIUM SULFATE 4 GM/100ML IV SOLN
4.0000 g | Freq: Once | INTRAVENOUS | Status: DC
  Filled 2018-01-03: qty 100

## 2018-01-03 MED ORDER — SODIUM CHLORIDE 0.9 % IV SOLN: INTRAVENOUS | Status: DC

## 2018-01-03 MED ORDER — FENTANYL CITRATE (PF) 250 MCG/5ML IJ SOLN
INTRAMUSCULAR | Status: AC
  Filled 2018-01-03: qty 25

## 2018-01-03 MED ORDER — METOPROLOL TARTRATE 12.5 MG HALF TABLET
12.5000 mg | ORAL_TABLET | Freq: Two times a day (BID) | ORAL | Status: DC
  Administered 2018-01-04 – 2018-01-09 (×8): 12.5 mg via ORAL
  Filled 2018-01-03 (×10): qty 1

## 2018-01-03 MED ORDER — DEXMEDETOMIDINE HCL IN NACL 200 MCG/50ML IV SOLN
INTRAVENOUS | Status: DC | PRN
  Administered 2018-01-03: .2 ug/kg/h via INTRAVENOUS

## 2018-01-03 MED ORDER — PHENYLEPHRINE 40 MCG/ML (10ML) SYRINGE FOR IV PUSH (FOR BLOOD PRESSURE SUPPORT)
PREFILLED_SYRINGE | INTRAVENOUS | Status: DC | PRN
  Administered 2018-01-03: 80 ug via INTRAVENOUS
  Administered 2018-01-03: 120 ug via INTRAVENOUS
  Administered 2018-01-03: 80 ug via INTRAVENOUS
  Administered 2018-01-03: 120 ug via INTRAVENOUS

## 2018-01-03 MED ORDER — MIDAZOLAM HCL 2 MG/2ML IJ SOLN
2.0000 mg | INTRAMUSCULAR | Status: DC | PRN
  Administered 2018-01-03 – 2018-01-04 (×2): 2 mg via INTRAVENOUS
  Filled 2018-01-03 (×3): qty 2

## 2018-01-03 MED ORDER — PANTOPRAZOLE SODIUM 40 MG PO TBEC
40.0000 mg | DELAYED_RELEASE_TABLET | Freq: Every day | ORAL | Status: DC
  Administered 2018-01-05 – 2018-01-10 (×6): 40 mg via ORAL
  Filled 2018-01-03 (×6): qty 1

## 2018-01-03 MED ORDER — SODIUM CHLORIDE 0.9 % IV SOLN
INTRAVENOUS | Status: DC | PRN
  Administered 2018-01-03: 500 mL

## 2018-01-03 MED ORDER — SODIUM BICARBONATE 4.2 % IV SOLN
INTRAVENOUS | Status: DC | PRN
  Administered 2018-01-03: 50 mL via INTRAVENOUS

## 2018-01-03 MED ORDER — DEXAMETHASONE SODIUM PHOSPHATE 10 MG/ML IJ SOLN
INTRAMUSCULAR | Status: AC
  Filled 2018-01-03: qty 1

## 2018-01-03 MED ORDER — HEPARIN SODIUM (PORCINE) 1000 UNIT/ML IJ SOLN
INTRAMUSCULAR | Status: DC | PRN
  Administered 2018-01-03: 30000 [IU] via INTRAVENOUS
  Administered 2018-01-03: 3000 [IU] via INTRAVENOUS
  Administered 2018-01-03: 9000 [IU] via INTRAVENOUS
  Administered 2018-01-03: 4000 [IU] via INTRAVENOUS

## 2018-01-03 MED ORDER — AMIODARONE HCL IN DEXTROSE 360-4.14 MG/200ML-% IV SOLN: 60.0000 mg/h | INTRAVENOUS | Status: DC

## 2018-01-03 MED ORDER — SODIUM CHLORIDE 0.9 % IV SOLN
1.5000 g | Freq: Two times a day (BID) | INTRAVENOUS | Status: AC
  Administered 2018-01-03 – 2018-01-05 (×4): 1.5 g via INTRAVENOUS
  Filled 2018-01-03 (×4): qty 1.5

## 2018-01-03 MED ORDER — LEVALBUTEROL HCL 0.63 MG/3ML IN NEBU
0.6300 mg | INHALATION_SOLUTION | Freq: Three times a day (TID) | RESPIRATORY_TRACT | Status: DC
  Administered 2018-01-03 – 2018-01-08 (×16): 0.63 mg via RESPIRATORY_TRACT
  Filled 2018-01-03 (×16): qty 3

## 2018-01-03 MED ORDER — NOREPINEPHRINE 4 MG/250ML-% IV SOLN
0.0000 ug/min | INTRAVENOUS | Status: DC
  Filled 2018-01-03: qty 250

## 2018-01-03 MED ORDER — ALBUMIN HUMAN 5 % IV SOLN
250.0000 mL | INTRAVENOUS | Status: AC | PRN
  Administered 2018-01-04: 12.5 g via INTRAVENOUS

## 2018-01-03 MED ORDER — PHENYLEPHRINE HCL 10 MG/ML IJ SOLN
INTRAMUSCULAR | Status: AC
  Filled 2018-01-03: qty 4

## 2018-01-03 MED ORDER — EPHEDRINE 5 MG/ML INJ
INTRAVENOUS | Status: AC
  Filled 2018-01-03: qty 10

## 2018-01-03 MED ORDER — AMIODARONE HCL IN DEXTROSE 360-4.14 MG/200ML-% IV SOLN
30.0000 mg/h | INTRAVENOUS | Status: DC
  Filled 2018-01-03: qty 200

## 2018-01-03 MED ORDER — PHENYLEPHRINE 40 MCG/ML (10ML) SYRINGE FOR IV PUSH (FOR BLOOD PRESSURE SUPPORT)
PREFILLED_SYRINGE | INTRAVENOUS | Status: AC
  Filled 2018-01-03: qty 30

## 2018-01-03 MED ORDER — NITROGLYCERIN IN D5W 200-5 MCG/ML-% IV SOLN: 0.0000 ug/min | INTRAVENOUS | Status: DC

## 2018-01-03 MED ORDER — ACETAMINOPHEN 500 MG PO TABS
1000.0000 mg | ORAL_TABLET | Freq: Four times a day (QID) | ORAL | Status: AC
  Administered 2018-01-04 – 2018-01-08 (×15): 1000 mg via ORAL
  Filled 2018-01-03 (×16): qty 2

## 2018-01-03 MED ORDER — PREGABALIN 50 MG PO CAPS
50.0000 mg | ORAL_CAPSULE | Freq: Three times a day (TID) | ORAL | Status: DC
  Administered 2018-01-04 – 2018-01-10 (×18): 50 mg via ORAL
  Filled 2018-01-03 (×18): qty 1

## 2018-01-03 MED ORDER — CALCIUM CHLORIDE 10 % IV SOLN
INTRAVENOUS | Status: DC | PRN
  Administered 2018-01-03 (×4): 100 mg via INTRAVENOUS

## 2018-01-03 MED ORDER — LORATADINE 10 MG PO TABS
10.0000 mg | ORAL_TABLET | Freq: Every day | ORAL | Status: DC
  Administered 2018-01-05 – 2018-01-10 (×6): 10 mg via ORAL
  Filled 2018-01-03 (×5): qty 1

## 2018-01-03 SURGICAL SUPPLY — 166 items
ADAPTER CARDIO PERF ANTE/RETRO (ADAPTER) ×5 IMPLANT
ADH SKN CLS APL DERMABOND .7 (GAUZE/BANDAGES/DRESSINGS) ×3
ADH SRG 12 PREFL SYR 3 SPRDR (MISCELLANEOUS)
ADPR PRFSN 84XANTGRD RTRGD (ADAPTER) ×3
ADPR TBG 2 MALE LL ART (MISCELLANEOUS)
AGENT HMST KT MTR STRL THRMB (HEMOSTASIS) ×3
ATRICLIP EXCLUSION 35 STD HAND (Clip) ×2 IMPLANT
BAG DECANTER FOR FLEXI CONT (MISCELLANEOUS) ×7 IMPLANT
BANDAGE ACE 4X5 VEL STRL LF (GAUZE/BANDAGES/DRESSINGS) ×5 IMPLANT
BANDAGE ACE 6X5 VEL STRL LF (GAUZE/BANDAGES/DRESSINGS) ×5 IMPLANT
BANDAGE ELASTIC 4 VELCRO ST LF (GAUZE/BANDAGES/DRESSINGS) ×2 IMPLANT
BANDAGE ELASTIC 6 VELCRO ST LF (GAUZE/BANDAGES/DRESSINGS) ×2 IMPLANT
BASKET HEART  (ORDER IN 25'S) (MISCELLANEOUS) ×1
BASKET HEART (ORDER IN 25'S) (MISCELLANEOUS) ×1
BASKET HEART (ORDER IN 25S) (MISCELLANEOUS) ×3 IMPLANT
BLADE CLIPPER SURG (BLADE) ×2 IMPLANT
BLADE STERNUM SYSTEM 6 (BLADE) ×5 IMPLANT
BLADE SURG 11 STRL SS (BLADE) ×2 IMPLANT
BLADE SURG 12 STRL SS (BLADE) ×5 IMPLANT
BLADE SURG 15 STRL LF DISP TIS (BLADE) ×3 IMPLANT
BLADE SURG 15 STRL SS (BLADE) ×5
BNDG GAUZE ELAST 4 BULKY (GAUZE/BANDAGES/DRESSINGS) ×5 IMPLANT
BOOT SUTURE AID YELLOW STND (SUTURE) ×2 IMPLANT
CANISTER SUCT 3000ML PPV (MISCELLANEOUS) ×8 IMPLANT
CANNULA GUNDRY RCSP 15FR (MISCELLANEOUS) ×5 IMPLANT
CARDIOBLATE CARDIAC ABLATION (MISCELLANEOUS)
CATH CPB KIT VANTRIGT (MISCELLANEOUS) ×5 IMPLANT
CATH HEART VENT LEFT (CATHETERS) ×3 IMPLANT
CATH RETROPLEGIA CORONARY 14FR (CATHETERS) IMPLANT
CATH ROBINSON RED A/P 18FR (CATHETERS) ×20 IMPLANT
CATH THORACIC 36FR RT ANG (CATHETERS) ×5 IMPLANT
CLIP FOGARTY SPRING 6M (CLIP) ×2 IMPLANT
CLIP RETRACTION 3.0MM CORONARY (MISCELLANEOUS) ×2 IMPLANT
CLIP VESOCCLUDE MED 24/CT (CLIP) ×3 IMPLANT
CLIP VESOCCLUDE SM WIDE 24/CT (CLIP) ×7 IMPLANT
CONT SPEC 4OZ CLIKSEAL STRL BL (MISCELLANEOUS) ×4 IMPLANT
COVER PROBE W GEL 5X96 (DRAPES) ×4 IMPLANT
COVER SURGICAL LIGHT HANDLE (MISCELLANEOUS) ×10 IMPLANT
COVER TRANSDUCER ULTRASND GEL (DRAPE) ×5 IMPLANT
COVER WAND RF STERILE (DRAPES) ×8 IMPLANT
CRADLE DONUT ADULT HEAD (MISCELLANEOUS) ×10 IMPLANT
DERMABOND ADVANCED (GAUZE/BANDAGES/DRESSINGS) ×2
DERMABOND ADVANCED .7 DNX12 (GAUZE/BANDAGES/DRESSINGS) ×3 IMPLANT
DEVICE CARDIOBLATE CARDIAC ABL (MISCELLANEOUS) IMPLANT
DRAIN CHANNEL 15F RND FF W/TCR (WOUND CARE) IMPLANT
DRAIN CHANNEL 32F RND 10.7 FF (WOUND CARE) ×5 IMPLANT
DRAPE CARDIOVASCULAR INCISE (DRAPES) ×5
DRAPE SLUSH/WARMER DISC (DRAPES) ×3 IMPLANT
DRAPE SRG 135X102X78XABS (DRAPES) ×3 IMPLANT
DRSG AQUACEL AG ADV 3.5X14 (GAUZE/BANDAGES/DRESSINGS) ×5 IMPLANT
DRSG COVADERM 4X6 (GAUZE/BANDAGES/DRESSINGS) ×2 IMPLANT
ELECT BLADE 4.0 EZ CLEAN MEGAD (MISCELLANEOUS) ×5
ELECT BLADE 6.5 EXT (BLADE) ×3 IMPLANT
ELECT CAUTERY BLADE 6.4 (BLADE) ×5 IMPLANT
ELECT REM PT RETURN 9FT ADLT (ELECTROSURGICAL) ×10
ELECTRODE BLDE 4.0 EZ CLN MEGD (MISCELLANEOUS) ×3 IMPLANT
ELECTRODE REM PT RTRN 9FT ADLT (ELECTROSURGICAL) ×9 IMPLANT
EVACUATOR SILICONE 100CC (DRAIN) ×2 IMPLANT
FELT TEFLON 1X6 (MISCELLANEOUS) ×8 IMPLANT
GAUZE SPONGE 4X4 12PLY STRL (GAUZE/BANDAGES/DRESSINGS) ×10 IMPLANT
GAUZE SPONGE 4X4 12PLY STRL LF (GAUZE/BANDAGES/DRESSINGS) ×6 IMPLANT
GLOVE BIO SURGEON STRL SZ7.5 (GLOVE) ×20 IMPLANT
GLOVE BIOGEL PI IND STRL 8 (GLOVE) ×3 IMPLANT
GLOVE BIOGEL PI INDICATOR 8 (GLOVE) ×2
GOWN STRL REUS W/ TWL LRG LVL3 (GOWN DISPOSABLE) ×18 IMPLANT
GOWN STRL REUS W/ TWL XL LVL3 (GOWN DISPOSABLE) ×6 IMPLANT
GOWN STRL REUS W/TWL LRG LVL3 (GOWN DISPOSABLE) ×25
GOWN STRL REUS W/TWL XL LVL3 (GOWN DISPOSABLE) ×5
HEMOSTAT POWDER SURGIFOAM 1G (HEMOSTASIS) ×27 IMPLANT
HEMOSTAT SNOW SURGICEL 2X4 (HEMOSTASIS) ×2 IMPLANT
HEMOSTAT SURGICEL 2X14 (HEMOSTASIS) ×5 IMPLANT
INSERT FOGARTY XLG (MISCELLANEOUS) IMPLANT
IV ADAPTER SYR DOUBLE MALE LL (MISCELLANEOUS) IMPLANT
KIT BASIN OR (CUSTOM PROCEDURE TRAY) ×10 IMPLANT
KIT SHUNT ARGYLE CAROTID ART 6 (VASCULAR PRODUCTS) IMPLANT
KIT SUCTION CATH 14FR (SUCTIONS) ×5 IMPLANT
KIT TURNOVER KIT B (KITS) ×10 IMPLANT
KIT VASOVIEW HEMOPRO 2 VH 4000 (KITS) ×5 IMPLANT
LEAD PACING MYOCARDI (MISCELLANEOUS) ×5 IMPLANT
LINE VENT (MISCELLANEOUS) ×2 IMPLANT
LOOP VESSEL MAXI BLUE (MISCELLANEOUS) ×2 IMPLANT
LOOP VESSEL MINI RED (MISCELLANEOUS) ×2 IMPLANT
MARKER GRAFT CORONARY BYPASS (MISCELLANEOUS) ×15 IMPLANT
NDL HYPO 25GX1X1/2 BEV (NEEDLE) IMPLANT
NDL SPNL 20GX3.5 QUINCKE YW (NEEDLE) IMPLANT
NEEDLE HYPO 25GX1X1/2 BEV (NEEDLE) ×5 IMPLANT
NEEDLE SPNL 20GX3.5 QUINCKE YW (NEEDLE) ×5 IMPLANT
NS IRRIG 1000ML POUR BTL (IV SOLUTION) ×39 IMPLANT
PACK CAROTID (CUSTOM PROCEDURE TRAY) ×3 IMPLANT
PACK E OPEN HEART (SUTURE) ×5 IMPLANT
PACK OPEN HEART (CUSTOM PROCEDURE TRAY) ×5 IMPLANT
PAD ARMBOARD 7.5X6 YLW CONV (MISCELLANEOUS) ×20 IMPLANT
PAD ELECT DEFIB RADIOL ZOLL (MISCELLANEOUS) ×5 IMPLANT
PATCH VASC XENOSURE 1CMX6CM (Vascular Products) ×5 IMPLANT
PATCH VASC XENOSURE 1X6 (Vascular Products) IMPLANT
PENCIL BUTTON HOLSTER BLD 10FT (ELECTRODE) ×5 IMPLANT
POWDER SURGICEL 3.0 GRAM (HEMOSTASIS) ×10 IMPLANT
PROBE CRYO2-ABLATION MALLABLE (MISCELLANEOUS) IMPLANT
PUNCH AORTIC ROTATE 4.0MM (MISCELLANEOUS) IMPLANT
PUNCH AORTIC ROTATE 4.5MM 8IN (MISCELLANEOUS) ×2 IMPLANT
PUNCH AORTIC ROTATE 5MM 8IN (MISCELLANEOUS) IMPLANT
SET CARDIOPLEGIA MPS 5001102 (MISCELLANEOUS) ×2 IMPLANT
SHUNT CAROTID BYPASS 10 (VASCULAR PRODUCTS) ×2 IMPLANT
SHUNT CAROTID BYPASS 12FRX15.5 (VASCULAR PRODUCTS) IMPLANT
SPONGE LAP 18X18 X RAY DECT (DISPOSABLE) ×14 IMPLANT
SPONGE LAP 4X18 RFD (DISPOSABLE) ×2 IMPLANT
SPONGE SURGIFOAM ABS GEL 100 (HEMOSTASIS) IMPLANT
STOPCOCK 4 WAY LG BORE MALE ST (IV SETS) ×2 IMPLANT
SURGIFLO W/THROMBIN 8M KIT (HEMOSTASIS) ×7 IMPLANT
SUT BONE WAX W31G (SUTURE) ×5 IMPLANT
SUT ETHIBON 2 0 V 52N 30 (SUTURE) ×12 IMPLANT
SUT ETHIBON EXCEL 2-0 V-5 (SUTURE) ×8 IMPLANT
SUT ETHIBOND 2 0 SH (SUTURE) ×5
SUT ETHIBOND 2 0 SH 36X2 (SUTURE) ×3 IMPLANT
SUT ETHIBOND V-5 VALVE (SUTURE) ×6 IMPLANT
SUT ETHILON 3 0 PS 1 (SUTURE) ×2 IMPLANT
SUT MNCRL AB 4-0 PS2 18 (SUTURE) ×7 IMPLANT
SUT PROLENE 3 0 RB 1 (SUTURE) ×5 IMPLANT
SUT PROLENE 3 0 SH 1 (SUTURE) IMPLANT
SUT PROLENE 3 0 SH DA (SUTURE) IMPLANT
SUT PROLENE 3 0 SH1 36 (SUTURE) IMPLANT
SUT PROLENE 4 0 RB 1 (SUTURE) ×25
SUT PROLENE 4 0 SH DA (SUTURE) ×5 IMPLANT
SUT PROLENE 4-0 RB1 .5 CRCL 36 (SUTURE) ×9 IMPLANT
SUT PROLENE 5 0 C 1 24 (SUTURE) ×7 IMPLANT
SUT PROLENE 5 0 C 1 36 (SUTURE) IMPLANT
SUT PROLENE 6 0 BV (SUTURE) ×19 IMPLANT
SUT PROLENE 6 0 C 1 30 (SUTURE) ×8 IMPLANT
SUT PROLENE 6 0 CC (SUTURE) ×15 IMPLANT
SUT PROLENE 8 0 BV175 6 (SUTURE) ×4 IMPLANT
SUT PROLENE BLUE 7 0 (SUTURE) ×11 IMPLANT
SUT PROLENE POLY MONO (SUTURE) ×2 IMPLANT
SUT SILK  1 MH (SUTURE)
SUT SILK 1 MH (SUTURE) IMPLANT
SUT SILK 2 0 (SUTURE) ×5
SUT SILK 2 0 SH CR/8 (SUTURE) ×2 IMPLANT
SUT SILK 2-0 18XBRD TIE 12 (SUTURE) IMPLANT
SUT SILK 3 0 (SUTURE) ×10
SUT SILK 3 0 SH CR/8 (SUTURE) IMPLANT
SUT SILK 3-0 18XBRD TIE 12 (SUTURE) IMPLANT
SUT STEEL 6MS V (SUTURE) ×8 IMPLANT
SUT STEEL SZ 6 DBL 3X14 BALL (SUTURE) ×5 IMPLANT
SUT VIC AB 1 CTX 18 (SUTURE) ×2 IMPLANT
SUT VIC AB 1 CTX 36 (SUTURE) ×45
SUT VIC AB 1 CTX36XBRD ANBCTR (SUTURE) ×6 IMPLANT
SUT VIC AB 2-0 CT1 27 (SUTURE) ×10
SUT VIC AB 2-0 CT1 TAPERPNT 27 (SUTURE) ×3 IMPLANT
SUT VIC AB 2-0 CTX 27 (SUTURE) IMPLANT
SUT VIC AB 3-0 SH 27 (SUTURE) ×5
SUT VIC AB 3-0 SH 27X BRD (SUTURE) ×3 IMPLANT
SUT VIC AB 3-0 X1 27 (SUTURE) ×4 IMPLANT
SYR 10ML KIT SKIN ADHESIVE (MISCELLANEOUS) IMPLANT
SYR CONTROL 10ML LL (SYRINGE) ×2 IMPLANT
SYSTEM CHEST DRAIN TLS 7FR (DRAIN) IMPLANT
SYSTEM SAHARA CHEST DRAIN ATS (WOUND CARE) ×5 IMPLANT
TAPE CLOTH SURG 4X10 WHT LF (GAUZE/BANDAGES/DRESSINGS) ×4 IMPLANT
TAPE PAPER 2X10 WHT MICROPORE (GAUZE/BANDAGES/DRESSINGS) ×2 IMPLANT
TOWEL GREEN STERILE (TOWEL DISPOSABLE) ×10 IMPLANT
TOWEL GREEN STERILE FF (TOWEL DISPOSABLE) ×5 IMPLANT
TRAY FOLEY SLVR 16FR TEMP STAT (SET/KITS/TRAYS/PACK) ×5 IMPLANT
TUBING ART PRESS 48 MALE/FEM (TUBING) IMPLANT
TUBING INSUFFLATION (TUBING) ×5 IMPLANT
UNDERPAD 30X30 (UNDERPADS AND DIAPERS) ×5 IMPLANT
VALVE MAGNA EASE AORTIC 23MM (Prosthesis & Implant Heart) ×2 IMPLANT
VENT LEFT HEART 12002 (CATHETERS) ×5
WATER STERILE IRR 1000ML POUR (IV SOLUTION) ×15 IMPLANT

## 2018-01-03 NOTE — H&P (Signed)
History and Physical Interval Note:  01/03/2018 7:14 AM  Justin [name]  has presented today for surgery, with the diagnosis of CAD AS AFIB  The various methods of treatment have been discussed with the patient and family. After consideration of risks, benefits and other options for treatment, the patient has consented to  Procedure(s): AORTIC VALVE REPLACEMENT (AVR) (N/A) CORONARY ARTERY BYPASS GRAFTING (CABG) (N/A) possible MAZE (N/A) TRANSESOPHAGEAL ECHOCARDIOGRAM (TEE) (N/A) ENDARTERECTOMY CAROTID (Right) as a surgical intervention .  The patient's history has been reviewed, patient examined, no change in status, stable for surgery.  I have reviewed the patient's chart and labs.  Questions were answered to the patient's satisfaction.     Plan for right carotid endarterectomy.  Asymptomatic high grade disease.  Marty Heck  Referring Physician: Dr. Prescott Gum  Patient name: Justin [name]      MRN: [id number]        DOB: [birth year]          Sex: male  REASON FOR CONSULT: Severe bilateral carotid occlusive disease  HPI: Justin [name] is a 62 y.o. male, currently under consideration and scheduled for coronary artery bypass grafting by Dr. Prescott Gum next week.  He was noted on preoperative exam to have occlusion of the left internal carotid artery with greater than 80% stenosis of the right internal carotid artery.  He denies any prior symptoms of TIA amaurosis or stroke.  He does have a history of atrial fibrillation.  He also has a history of borderline myeloproliferative disorder that is currently being observed.  He also has a history of peripheral arterial disease.  He has previously documented ABIs of 0.5 on the right and 0.4 on the left.  He does complain of left leg claudication symptoms at about half a block to 1 block.  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  He does have a history of tobacco abuse.  Other  medical problems include lipidemia and hypertension.  These are both currently stable.  He is on Eliquis aspirin and a statin.      Past Medical History:  Diagnosis Date  . Acute meniscal tear of knee LEFT  . Aortic stenosis 12/01/2017   NONRHEUMATIC, AORTIC VALVE CALCIFICATIONS, MILD TO MODERATE REGURG, MILD TO MODERATE CALCIFIED ANNULUS per ECHO 10/25/17 @ [facility]  . Arthritis   . Atrial fibrillation ([place]) 11/12/2017   AT O/V WITH PCP  . Heart murmur MILD-- ASYMPTOMATIC  . Hyperlipidemia   . Hypertension   . Left knee pain         Past Surgical History:  Procedure Laterality Date  . APPENDECTOMY  1998  . CATARACT EXTRACTION W/ INTRAOCULAR LENS  IMPLANT, BILATERAL  1998/  2000  . CERVICAL FUSION  1985   C4 - 5  . KNEE ARTHROSCOPY W/ MENISCECTOMY  1991   LEFT KNEE  . NASAL SINUS SURGERY  1982  . RIGHT/LEFT HEART CATH AND CORONARY ANGIOGRAPHY N/A 12/24/2017   Procedure: RIGHT/LEFT HEART CATH AND CORONARY ANGIOGRAPHY;  Surgeon: Jolaine Artist, MD;  Location: [place] [facility];  Service: Cardiovascular;  Laterality: N/A;  . ROTATOR CUFF REPAIR  09-04-2005   LEFT SHOULDER  . TEE WITHOUT CARDIOVERSION N/A 12/24/2017   Procedure: TRANSESOPHAGEAL ECHOCARDIOGRAM (TEE);  Surgeon: Jolaine Artist, MD;  Location: [place];  Service: Cardiovascular;  Laterality: N/A;  . TONSILLECTOMY  AGE 63         Family History  Adopted:  Yes  Problem Relation Age of Onset  . Cancer Father     SOCIAL HISTORY: Social History        Socioeconomic History  . Marital status: Married    Spouse name: Not on file  . Number of children: Not on file  . Years of education: Not on file  . Highest education level: Not on file  Occupational History  . Not on file  Social Needs  . Financial resource strain: Not on file  . Food insecurity:    Worry: Not on file    Inability: Not on file  . Transportation needs:    Medical: Not on file     Non-medical: Not on file  Tobacco Use  . Smoking status: Current Some Day Smoker    Packs/day: 0.25    Years: 23.00    Pack years: 5.75    Types: Cigars  . Smokeless tobacco: Never Used  . Tobacco comment: quit cigs in 2011 and smokes cigars daily- from 3-10 cigars-09/27/13  Substance and Sexual Activity  . Alcohol use: Yes    Comment: OCCASIONAL  . Drug use: No  . Sexual activity: Not on file  Lifestyle  . Physical activity:    Days per week: Not on file    Minutes per session: Not on file  . Stress: Not on file  Relationships  . Social connections:    Talks on phone: Not on file    Gets together: Not on file    Attends religious service: Not on file    Active member of club or organization: Not on file    Attends meetings of clubs or organizations: Not on file    Relationship status: Not on file  . Intimate partner violence:    Fear of current or ex partner: Not on file    Emotionally abused: Not on file    Physically abused: Not on file    Forced sexual activity: Not on file  Other Topics Concern  . Not on file  Social History Narrative  . Not on file         Allergies  Allergen Reactions  . Codeine Swelling    Tolerates hydrocodone and oxycodone           Current Outpatient Medications  Medication Sig Dispense Refill  . apixaban (ELIQUIS) 5 MG TABS tablet Take 5 mg by mouth 2 (two) times daily.    Marland Kitchen atenolol (TENORMIN) 50 MG tablet Take 50 mg by mouth daily.    . Cholecalciferol 125 MCG (5000 UT) capsule Take 1 capsule by mouth daily.     . cyclobenzaprine (FLEXERIL) 10 MG tablet Take 10 mg by mouth 3 (three) times daily.     . fluticasone (FLONASE) 50 MCG/ACT nasal spray Place 2 sprays into both nostrils daily.    . hydrochlorothiazide (MICROZIDE) 12.5 MG capsule Take 12.5 mg by mouth daily.    Marland Kitchen HYDROcodone-acetaminophen (NORCO/VICODIN) 5-325 MG per tablet Take 1 tablet by mouth every 6 (six) hours as needed  for moderate pain.    . hydroxyurea (HYDREA) 500 MG capsule Take 1 capsule (500 mg total) by mouth 2 (two) times daily. May take with food to minimize GI side effects. 60 capsule 1  . loratadine (CLARITIN) 10 MG tablet Take 10 mg by mouth daily.    . meloxicam (MOBIC) 15 MG tablet Take 15 mg by mouth daily.     . metFORMIN (GLUCOPHAGE-XR) 500 MG 24 hr tablet Take 500 mg by mouth daily  before supper.    . Multiple Vitamin (MULTIVITAMIN) tablet Take 1 tablet by mouth daily.    . nefazodone (SERZONE) 100 MG tablet Take 100 mg by mouth 2 (two) times daily.    . Omega 3 1200 MG CAPS Take 1 capsule by mouth 2 (two) times daily.    . pregabalin (LYRICA) 50 MG capsule Take 50 mg by mouth 3 (three) times daily.    . simvastatin (ZOCOR) 40 MG tablet Take 40 mg by mouth every evening.    . zolpidem (AMBIEN) 10 MG tablet Take 10 mg by mouth at bedtime as needed.    Marland Kitchen aspirin 81 MG tablet Take 81 mg by mouth daily.      No current facility-administered medications for this visit.     ROS:   General:  No weight loss, Fever, chills  HEENT: No recent headaches, no nasal bleeding, no visual changes, no sore throat  Neurologic: No dizziness, blackouts, seizures. No recent symptoms of stroke or mini- stroke. No recent episodes of slurred speech, or temporary blindness.  Cardiac: No recent episodes of chest pain/pressure, no shortness of breath at rest.  No shortness of breath with exertion.  + history of atrial fibrillation or irregular heartbeat  Vascular: No history of rest pain in feet.  + history of claudication.  No history of non-healing ulcer, No history of DVT   Pulmonary: No home oxygen, no productive cough, no hemoptysis,  No asthma or wheezing  Musculoskeletal:  [X]  Arthritis, [ ]  Low back pain,  [X]  Joint pain  Hematologic:No history of hypercoagulable state.  No history of easy bleeding.  No history of anemia  Gastrointestinal: No hematochezia or melena,   No gastroesophageal reflux, no trouble swallowing  Urinary: [ ]  chronic Kidney disease, [ ]  on HD - [ ]  MWF or [ ]  TTHS, [ ]  Burning with urination, [ ]  Frequent urination, [ ]  Difficulty urinating;   Skin: No rashes  Psychological: No history of anxiety,  No history of depression   Physical Examination      Vitals:   12/30/17 1324 12/30/17 1328  BP: 117/78 (!) 144/71  Pulse: 97 97  Resp: 18   Temp: (!) 97 F (36.1 C)   TempSrc: Oral   SpO2: 95%   Weight: 197 lb (89.4 kg)   Height: 5\' 8"  (1.727 m)     Body mass index is 29.95 kg/m.  General:  Alert and oriented, no acute distress HEENT: Normal Neck: Soft right carotid bruit no left carotid bruit Pulmonary: Clear to auscultation bilaterally Cardiac: Regular Rate and Rhythm 2/6 systolic ejection murmur heard best over the right second interspace Abdomen: Soft, non-tender, non-distended, no mass Skin: No rash Extremity Pulses:  2+ radial, brachial, 1+ right absent left femoral, and popliteal dorsalis pedis, posterior tibial pulses bilaterally Musculoskeletal: No deformity or edema      Neurologic: Upper and lower extremity motor 5/5 and symmetric  DATA: I reviewed the patient's carotid duplex exam which shows a velocity of 502/132 on the right side left side was occluded.  Is a significant gradient between the left and right arm pressures with the left arm pressure being 117 compared to 152 on the right.  Is also stenotic flow of the left subclavian   ASSESSMENT: #1 high-grade asymptomatic right internal carotid artery stenosis with contralateral occlusion higher risk for stroke with artery bypass grafting.  Plan will be for right carotid endarterectomy by my partner Dr. Carlis Abbott.  Of note the patient also has a  great dent in the left to right arm blood pressure the arterial line should go in the right arm he most likely has significant left subclavian artery stenosis.  Risk benefits possible complications of  procedure details regarding right carotid endarterectomy were discussed with the patient and his wife today.  These include but are not limited to stroke risk of 3 to 5%, cranial nerve injury risk of 10%, death related to combined carotid CABG operation.  Infection and bleeding.  He understands and agrees to proceed.  2.  Peripheral arterial disease patient will have further evaluation of this after he has recovered from his coronary artery bypass grafting.  He is not really that debilitated by his symptoms currently.  Plan: See above  Ruta Hinds, MD [facility] Office: [phone] Pager: [phone]

## 2018-01-03 NOTE — Progress Notes (Signed)
  Echocardiogram 2D Echocardiogram has been performed.  Justin [name] 01/03/2018, 9:19 AM

## 2018-01-03 NOTE — Op Note (Signed)
OPERATIVE NOTE  PROCEDURE:   1.  right carotid endarterectomy with bovine patch angioplasty 2.  right intraoperative carotid ultrasound  PRE-OPERATIVE DIAGNOSIS: right asymptomatic high grade carotid stenosis  POST-OPERATIVE DIAGNOSIS: same as above   SURGEON: Marty Heck, MD  ASSISTANT(S): Leontine Locket, PA  ANESTHESIA: general  ESTIMATED BLOOD LOSS: <75 cc  FINDING(S): 1.  Continuous Doppler audible flow signatures are appropriate for internal and external carotid artery. 2.  No evidence of intimal flap visualized on transverse or longitudinal ultrasonography. 3.  High grade carotid plaque.  SPECIMEN(S):  Carotid plaque (sent to Pathology)  INDICATIONS:   Justin [name] is a 62 y.o. male who presents with right asymptomatic carotid stenosis >80% and a left carotid occusion.  We were asked to evaluate the patient by Dr. Prescott Gum who is planning a combined coronary bypass and aortic valve replacement today in conjunction with vascular surgery.  I discussed with the patient the risks, benefits, and alternatives to carotid endarterectomy.  I discussed the procedural details of carotid endarterectomy with the patient.  The patient is aware that the risks of carotid endarterectomy include but are not limited to: bleeding, infection, stroke, myocardial infarction, death, cranial nerve injuries both temporary and permanent, neck hematoma, possible airway compromise, labile blood pressure post-operatively, cerebral hyperperfusion syndrome, and possible need for additional interventions in the future. The patient is aware of the risks and agrees to proceed forward with the procedure.  DESCRIPTION: After full informed written consent was obtained from the patient, the patient was brought back to the operating room and placed supine upon the operating table.  Prior to induction, the patient received IV antibiotics.  After obtaining adequate anesthesia, the patient was placed  into semi-Fowler position with a shoulder roll in place and the patient's neck slightly hyperextended and rotated away from the surgical site.  The patient was prepped in the standard fashion for a right carotid endarterectomy.  I made an incision anterior to the sternocleidomastoid muscle and dissected down through the subcutaneous tissue.  The platysma was opened with electrocautery.  I then used Bovie cautery and blunt dissection to dissect through the underlying platysma and to mobilize the anterior border of the sternocleidomastoid as well as the internal jugular vein laterally.  The facial vein was ligated with 3-0 silk and surgical clips and divided.  After identifying the carotid artery I used Metzenbaum scissors to bluntly dissect the common carotid artery and then controlled this with a umbilical tape.  It should be noted the carotid artery was very inflamed and the dissection was difficult.  At this point in time the patient was given 100 units/kg of IV heparin and we checked an ACT to ensure it was greater than 250.  We did have to give additional heparin throughout the case.  I then carried my dissection cephalad and mobilized the external carotid artery and superior thyroid artery and controlled each of these with a vessel loop.  I then dissected out the internal carotid artery well past the distal plaque.  The internal carotid artery was then controlled with a umbilical tape as well. I was careful to identify the vagus nerve between the internal jugular and common carotid and this was presereved.  I was also careful to identify and preserve the hypoglossal nerve and this was preserved.    Once our ACT was confirmed, I proceeded by clamping the internal carotid artery with a bulldog clamp first.  The proximal common carotid artery was controlled with  a angled debakey clamp.  The external carotid was controlled with a vessel loop.  I subsequently opened the common carotid artery with an 11 blade scalpel  in longitudinal fashion and extended the arteriotomy with Potts scissors onto the ICA past the distal plaque.  There was significant disease in the common carotid artery proximally and I could not place a shunt until the endarterectomy was complete.. I then used a Garment/textile technologist and performed a endarterectomy starting in the common carotid artery.  The external carotid artery was endarterectomized with an eversion technique and I was careful to feather the distal ICA plaque.  The specimen was passed off the field.  The endarterectomy site was then flushed with heparinized saline and I was careful to ensure there were no flaps in the endarterectomy site.  At this point a 10 [place] shunt was brought to the field and then initially placed distally into the ICA after removing the bulldog clamp and controlled with a vessel loop. The shunt was back bleed with good flow.  I then placed the proximal end of the shunt in the common carotid artery and controlled this with a Rummel tourniquet. I did tack down the distal ICA endpoint with 7-0 Prolene.  Given the disease in the common carotid, I did dissect more proximally and moved my umbilical tape more proximal on the artery to get a better dissection.  I then used a penfield to endarterectomize more proximally in the common carotid exposure and transected my flap with scissors.  I reinspected the field with heparinized saline flushes and no overt flaps were appreciated.  I then brought a bovine carotid patch on the field and this was sewn in place with a running anastomosis using a 6-0 Prolene distally and a 5-0 proximal.  The bovine patch was trimmed accordingly.  The shunt was removed just before completion of the patch.  The artery was flushed antegrade and retrograde prior to completion of the patch.  Once the patch was complete, I flushed up the external carotid artery first prior to releasing the internal carotid artery clamp.  An intraoperative duplex was  performed that showed no overt flaps.  A doppler also confirmed continuous flow in the internal carotid artery.  We did not reverse the heparin since Dr. Prescott Gum was planning coronary bypass and aortic valve replacement.  I did inspect the patch and had to place several 6-0 prolene sutures in mattress format to get hemostasis.  I used Surgicel Snow to get hemostasis around the patch.  A large laparotomy pad was placed over the patch in the neck incision.  The skin was closed loosely with 3-0 Nylon over the laparotomy pad for hemostasis.  CT surgery will remove the laparotomy pad and close the neck at the completion of their portion of the case.      COMPLICATIONS: None  CONDITION: Stable  Marty Heck, MD [facility] Office: [phone] Pager: [phone]  01/03/2018, 10:42 AM

## 2018-01-03 NOTE — Anesthesia Procedure Notes (Signed)
Central Venous Catheter Insertion Performed by: Belinda Block, MD Start/End12/16/2019 6:50 AM, 01/03/2018 7:05 AM Preanesthetic checklist: patient identified, IV checked, site marked, risks and benefits discussed, surgical consent, monitors and equipment checked, pre-op evaluation and timeout performed Position: Trendelenburg Lidocaine 1% used for infiltration and patient sedated Hand hygiene performed , maximum sterile barriers used  and Seldinger technique used PA cath was placed.Sheath introducer Procedure performed using ultrasound guided technique. Ultrasound Notes:anatomy identified Attempts: 1 Following insertion, line sutured, dressing applied and Biopatch. Post procedure assessment: blood return through all ports  Patient tolerated the procedure well with no immediate complications.

## 2018-01-03 NOTE — Progress Notes (Signed)
Pharmacy Antibiotic Note  Justin [name] is a 62 y.o. male admitted on 01/03/2018 for CABG/AVR.  Pharmacy has been consulted for vancomycin dosing post-op prophylaxis.  Scr was normal.   Plan: Vancomycin 1g IV q12 hours x 4 doses Pharmacy to continue to follow post-op     Temp (24hrs), Avg:98.2 F (36.8 C), Min:98.2 F (36.8 C), Max:98.2 F (36.8 C)  Recent Labs  Lab 12/30/17 1113 01/03/18 1641 01/03/18 1913  WBC 25.0*  --  24.9*  CREATININE 0.65 0.70  --     Estimated Creatinine Clearance: 102.7 mL/min (by C-G formula based on SCr of 0.7 mg/dL).    Allergies  Allergen Reactions  . Codeine Swelling    SWELLING REACTION UNSPECIFIED  Tolerates hydrocodone and oxycodone     Thank you for allowing pharmacy to be a part of this patient's care.  Erin Hearing PharmD., BCPS Clinical Pharmacist 01/03/2018 7:56 PM

## 2018-01-03 NOTE — Transfer of Care (Signed)
Immediate Anesthesia Transfer of Care Note  Patient: Esdras Delair Bolanos  Procedure(s) Performed: AORTIC VALVE REPLACEMENT (AVR) using 19mm Magna Ease Bioprosthesis Aortic Valve (N/A Chest) CORONARY ARTERY BYPASS GRAFTING (CABG) x 4 (LIMA to LAD, SVG to DIAGONAL, SVG to RAMUS INTERMEDIATE, and SVG to PDA), USING LEFT INFTERNAL MAMMARY ARTERY AND (N/A Chest) TRANSESOPHAGEAL ECHOCARDIOGRAM (TEE) (N/A ) ENDARTERECTOMY CAROTID (Right )  Patient Location: ICU  Anesthesia Type:General  Level of Consciousness: Patient remains intubated per anesthesia plan  Airway & Oxygen Therapy: Patient remains intubated per anesthesia plan and Patient placed on Ventilator (see vital sign flow sheet for setting)  Post-op Assessment: Report given to RN  Post vital signs: Reviewed and stable  Last Vitals:  Vitals Value Taken Time  BP    Temp    Pulse    Resp    SpO2      Last Pain:  Vitals:   01/03/18 0614  PainSc: 0-No pain      Patients Stated Pain Goal: 0 (11/15/23 3664)  Complications: No apparent anesthesia complications

## 2018-01-03 NOTE — Progress Notes (Signed)
Pre Procedure note for inpatients:   Justin [name] has been scheduled for Procedure(s): AORTIC VALVE REPLACEMENT (AVR) (N/A) CORONARY ARTERY BYPASS GRAFTING (CABG) (N/A) possible MAZE (N/A) TRANSESOPHAGEAL ECHOCARDIOGRAM (TEE) (N/A) ENDARTERECTOMY CAROTID (Right) today. The various methods of treatment have been discussed with the patient. After consideration of the risks, benefits and treatment options the patient has consented to the planned procedure.   The patient has been seen and labs reviewed. There are no changes in the patient's condition to prevent proceeding with the planned procedure today.  Recent labs:  Lab Results  Component Value Date   WBC 25.0 (H) 12/30/2017   HGB 16.3 12/30/2017   HCT 50.1 12/30/2017   PLT 257 12/30/2017   GLUCOSE 107 (H) 12/30/2017   ALT 18 12/30/2017   AST 36 12/30/2017   NA 135 12/30/2017   K 4.2 12/30/2017   CL 101 12/30/2017   CREATININE 0.65 12/30/2017   BUN 13 12/30/2017   CO2 20 (L) 12/30/2017   INR 1.13 12/30/2017   HGBA1C 5.9 (H) 12/30/2017    Len Childs, MD 01/03/2018 7:01 AM

## 2018-01-03 NOTE — Anesthesia Procedure Notes (Signed)
Procedure Name: Intubation Date/Time: 01/03/2018 7:56 AM Performed by: Gaylene Brooks, CRNA Pre-anesthesia Checklist: Patient identified, Emergency Drugs available, Suction available and Patient being monitored Patient Re-evaluated:Patient Re-evaluated prior to induction Oxygen Delivery Method: Circle System Utilized Preoxygenation: Pre-oxygenation with 100% oxygen Induction Type: IV induction Ventilation: Mask ventilation without difficulty and Oral airway inserted - appropriate to patient size Laryngoscope Size: Glidescope and 3 Grade View: Grade I Tube type: Oral Tube size: 8.0 mm Number of attempts: 1 Airway Equipment and Method: Stylet,  Oral airway and Video-laryngoscopy Placement Confirmation: ETT inserted through vocal cords under direct vision,  positive ETCO2 and breath sounds checked- equal and bilateral Secured at: 22 cm Tube secured with: Tape Dental Injury: Teeth and Oropharynx as per pre-operative assessment  Difficulty Due To: Difficult Airway- due to dentition Comments: DL by CRNA with Sabra Heck and MAC. Grade 1 views both times, but due to large diseased teeth, unable to pass ETT through cords. DL by Dr. Nyoka Cowden with same result. Glidescope grade 1 view. ETT easily passed through cords. +ETCO2, BBS=.

## 2018-01-03 NOTE — H&P (Signed)
PCP is [name], MD Referring Provider is No ref. provider found  No chief complaint on file.   HPI: Patient returns for scheduled office visit after initial consultation followed by left and right heart catheterization and TEE  Moderate aortic stenosis with mean gradient 33 mmHg peak gradient 55 mmHg LVEF 45%, global hypokinesia Severe multivessel CAD with 90% LAD stenosis 90% RCA stenosis 80% circumflex disease.  Right heart cath data shows compensated hemodynamics with normal cardiac output and coox 70%. COPD with active smoking, PFTs pending, chest x-ray pending Recent onset atrial fibrillation on Eliquis Recent diagnosis polycythemia vera placed on hydroxyurea and 81 mg aspirin Obesity with successful intentional weight loss of 25 pounds on low-carb diet Severe lower back arthritis   preop carotid duplex shows R carotid approx 90% stenosis patienr assessed by VVS and endarterectomy recommended combined with AVR CABG Past Medical History:  Diagnosis Date  . Acute meniscal tear of knee LEFT  . Aortic stenosis 12/01/2017   NONRHEUMATIC, AORTIC VALVE CALCIFICATIONS, MILD TO MODERATE REGURG, MILD TO MODERATE CALCIFIED ANNULUS per ECHO 10/25/17 @ [facility]  . Arthritis   . Atrial fibrillation ([place]) 11/12/2017   AT O/V WITH PCP  . Heart murmur MILD-- ASYMPTOMATIC  . Hyperlipidemia   . Hypertension   . Left knee pain     Past Surgical History:  Procedure Laterality Date  . APPENDECTOMY  1998  . CATARACT EXTRACTION W/ INTRAOCULAR LENS  IMPLANT, BILATERAL  1998/  2000  . CERVICAL FUSION  1985   C4 - 5  . KNEE ARTHROSCOPY W/ MENISCECTOMY  1991   LEFT KNEE  . NASAL SINUS SURGERY  1982  . RIGHT/LEFT HEART CATH AND CORONARY ANGIOGRAPHY N/A 12/24/2017   Procedure: RIGHT/LEFT HEART CATH AND CORONARY ANGIOGRAPHY;  Surgeon: Jolaine Artist, MD;  Location: [facility];  Service: Cardiovascular;  Laterality: N/A;  . ROTATOR CUFF REPAIR  09-04-2005   LEFT  SHOULDER  . TEE WITHOUT CARDIOVERSION N/A 12/24/2017   Procedure: TRANSESOPHAGEAL ECHOCARDIOGRAM (TEE);  Surgeon: Jolaine Artist, MD;  Location: [facility];  Service: Cardiovascular;  Laterality: N/A;  . TONSILLECTOMY  AGE 37    Family History  Adopted: Yes  Problem Relation Age of Onset  . Cancer Father     Social History Social History   Tobacco Use  . Smoking status: Current Some Day Smoker    Packs/day: 0.25    Years: 23.00    Pack years: 5.75    Types: Cigars  . Smokeless tobacco: Never Used  . Tobacco comment: quit cigs in 2011 and smokes cigars daily- from 3-10 cigars-09/27/13  Substance Use Topics  . Alcohol use: Yes    Comment: OCCASIONAL  . Drug use: No    Current Facility-Administered Medications  Medication Dose Route Frequency Provider Last Rate Last Dose  . 0.9 %  sodium chloride infusion   Intravenous Continuous Marty Heck, MD      . ceFAZolin (ANCEF) IVPB 2g/100 mL premix  2 g Intravenous 30 min Pre-Op Marty Heck, MD      . cefUROXime (ZINACEF) 1.5 g in sodium chloride 0.9 % 100 mL IVPB  1.5 g Intravenous To OR Prescott Gum, Collier Salina, MD      . cefUROXime (ZINACEF) 750 mg in sodium chloride 0.9 % 100 mL IVPB  750 mg Intravenous To OR Prescott Gum, Collier Salina, MD      . chlorhexidine (HIBICLENS) 4 % liquid 2 application  30 mL Topical UD Prescott Gum, Collier Salina, MD      .  Chlorhexidine Gluconate Cloth 2 % PADS 6 each  6 each Topical Once Marty Heck, MD       And  . Chlorhexidine Gluconate Cloth 2 % PADS 6 each  6 each Topical Once Marty Heck, MD      . dexmedetomidine (PRECEDEX) 400 MCG/100ML (4 mcg/mL) infusion  0.1-0.7 mcg/kg/hr Intravenous To OR Prescott Gum, Collier Salina, MD      . DOPamine (INTROPIN) 800 mg in dextrose 5 % 250 mL (3.2 mg/mL) infusion  0-10 mcg/kg/min Intravenous To OR Prescott Gum, Collier Salina, MD      . EPINEPHrine (ADRENALIN) 4 mg in dextrose 5 % 250 mL (0.016 mg/mL) infusion  0-10 mcg/min Intravenous To OR Prescott Gum, Collier Salina, MD       . heparin 2,500 Units, papaverine 30 mg in electrolyte-148 (PLASMALYTE-148) 500 mL irrigation   Irrigation To OR Prescott Gum, Collier Salina, MD      . heparin 30,000 units/NS 1000 mL solution for CELLSAVER   Other To OR Prescott Gum, Collier Salina, MD      . insulin regular, human (MYXREDLIN) 100 units/ 100 mL infusion   Intravenous To OR Prescott Gum, Collier Salina, MD      . magnesium sulfate (IV Push/IM) injection 40 mEq  40 mEq Other To OR Prescott Gum, Collier Salina, MD      . milrinone (PRIMACOR) 20 MG/100 ML (0.2 mg/mL) infusion  0.3 mcg/kg/min Intravenous To OR Prescott Gum, Collier Salina, MD      . nitroGLYCERIN 50 mg in dextrose 5 % 250 mL (0.2 mg/mL) infusion  2-200 mcg/min Intravenous To OR Prescott Gum, Collier Salina, MD      . norepinephrine (LEVOPHED) 4mg  in D5W 245mL premix infusion  0-40 mcg/min Intravenous Titrated Prescott Gum, Collier Salina, MD      . phenylephrine (NEOSYNEPHRINE) 20-0.9 MG/250ML-% infusion  30-200 mcg/min Intravenous To OR Prescott Gum, Collier Salina, MD      . potassium chloride injection 80 mEq  80 mEq Other To OR Prescott Gum, Collier Salina, MD      . tranexamic acid (CYKLOKAPRON) 2,500 mg in sodium chloride 0.9 % 250 mL (10 mg/mL) infusion  1.5 mg/kg/hr Intravenous To OR Prescott Gum, Collier Salina, MD      . tranexamic acid (CYKLOKAPRON) bolus via infusion - over 30 minutes 1,341 mg  15 mg/kg Intravenous To OR Prescott Gum, Collier Salina, MD      . tranexamic acid (CYKLOKAPRON) pump prime solution 179 mg  2 mg/kg Intracatheter To OR Prescott Gum, Collier Salina, MD      . vancomycin (VANCOCIN) 1,500 mg in sodium chloride 0.9 % 250 mL IVPB  1,500 mg Intravenous To OR Ivin Poot, MD        Allergies  Allergen Reactions  . Codeine Swelling    SWELLING REACTION UNSPECIFIED  Tolerates hydrocodone and oxycodone     Review of Systems  Patient had a unit of blood removed approximately 10 days ago for polycythemia Patient still smoking 1 to 2 cigars daily. No fever no productive cough shortness of breath syncope or abdominal pain or dysuria  BP 127/61   Pulse 81   Temp 98.2  F (36.8 C)   Resp 18   SpO2 97%  Physical Exam      Exam    General- alert and comfortable    Neck- no JVD, no cervical adenopathy palpable, no carotid bruit   Lungs- clear without rales, wheezes   Cor-regular rhythm from atrial fibrillation, 3/6 systolic ejection murmur of aortic stenosis, no gallop   Abdomen- soft, non-tender  Extremities - warm, non-tender, minimal edema   Neuro- oriented, appropriate, no focal weakness   Diagnostic Tests: Images of coronary angiogram, echocardiogram-TEE personally reviewed and counseled with patient.   Impression: Patient will be scheduled for multivessel CABG with combined aortic valve replacement with a bioprosthetic valve on Monday, December 16.  Possible left atrial isolation with Maze procedure as well.  I discussed the procedure in detail with the patient and his partner including the benefits and risks.  He has stopped his Eliquis and will stop the hydroxyurea and stop smoking before surgery.Right carotid endarterectomy will be done by Dr Carlis Abbott   Plan: AVR CABG plus minus left atrial Maze procedure on December 16 at [facility].  All issues , risks discussed withj patient   Len Childs, MD [facility] [phone]

## 2018-01-03 NOTE — Progress Notes (Addendum)
1905: Verbal from Dr. Prescott Gum to inc Fi02 to 100 and wean as tolerated. Also to increase milrinone to 0.52mcg/kg/hr. Will monitor patient closely in immediate post op period.   1945: Initial ABG drawn, results read to DR. Gerhardt. Orders to change tidal volume to 600 and increase RR to 16. Orders initiated, RT notified. Repeat ABG 30 min.  2020: Updated Dr. Servando Snare on results of ABG. RR inc to 18, VT to 650. Orders for ABG in 30 mins and q4h.

## 2018-01-03 NOTE — Brief Op Note (Signed)
01/03/2018  3:19 PM  PATIENT:  Justin [name]  62 y.o. male  PRE-OPERATIVE DIAGNOSIS:  1. Coronary artery disease 2. Moderate to severe aortic stenosis 3. Atrial fibrillation 4. Right carotid stenosis  POST-OPERATIVE DIAGNOSIS:  1. Coronary artery disease 2. Moderate to severe aortic stenosis 3. Atrial fibrillation 4. Right carotid stenosis  PROCEDURE: TRANSESOPHAGEAL ECHOCARDIOGRAM (TEE), MEDIAN STERNOTOMY for CORONARY ARTERY BYPASS GRAFTING (CABG) x 4 (LIMA to LAD, SVG to DIAGONAL, SVG to RAMUS INTERMEDIATE, and SVG to PDA), AORTIC VALVE REPLACEMENT (AVR) (using a Pericardial tissue valve, model# 3300TFX, Serial # [id number], size 23),  LA CLIP (size 35), RIGHT CAROTID ENDARTERECTOMY  With PATCH ANGIOPLSTY  SURGEON:  Surgeon(s) and Role: Panel 1:    Ivin Poot, MD - Primary Panel 2:    Marty Heck, MD - Primary  PHYSICIAN ASSISTANTS: 1. Samantha Rhyne PA-C  2. Lars Pinks PA-C  ASSISTANTS: Vernie Murders RNFA  ANESTHESIA:   general  EBL:  800 mL   BLOOD ADMINISTERED:Two FFP and one PLTS  DRAINS: Chest tubes placed in the mediastinal and pleural spaces   SPECIMEN:  Source of Specimen:  Native AV leaflets  DISPOSITION OF SPECIMEN:  PATHOLOGY  COUNTS CORRECT:  YES  DICTATION: .Dragon Dictation  PLAN OF CARE: Admit to inpatient   PATIENT DISPOSITION:  ICU - intubated and hemodynamically stable.   Delay start of Pharmacological VTE agent (>24hrs) due to surgical blood loss or risk of bleeding: yes  BASELINE WEIGHT: 89.4 kg

## 2018-01-03 NOTE — Anesthesia Preprocedure Evaluation (Addendum)
Anesthesia Evaluation  Patient identified by MRN, date of birth, ID band Patient awake    Reviewed: Allergy & Precautions, NPO status , Patient's Chart, lab work & pertinent test results  Airway Mallampati: II  TM Distance: >3 FB     Dental   Pulmonary sleep apnea , Current Smoker,    breath sounds clear to auscultation       Cardiovascular hypertension,  Rhythm:Regular Rate:Normal     Neuro/Psych    GI/Hepatic negative GI ROS, Neg liver ROS,   Endo/Other  negative endocrine ROS  Renal/GU negative Renal ROS     Musculoskeletal  (+) Arthritis ,   Abdominal   Peds  Hematology   Anesthesia Other Findings   Reproductive/Obstetrics                            Anesthesia Physical Anesthesia Plan  ASA: IV  Anesthesia Plan: General   Post-op Pain Management:    Induction: Intravenous  PONV Risk Score and Plan: 2 and Treatment may vary due to age or medical condition and Midazolam  Airway Management Planned: Oral ETT  Additional Equipment: Arterial line, PA Cath, TEE and Ultrasound Guidance Line Placement  Intra-op Plan:   Post-operative Plan: Post-operative intubation/ventilation  Informed Consent: I have reviewed the patients History and Physical, chart, labs and discussed the procedure including the risks, benefits and alternatives for the proposed anesthesia with the patient or authorized representative who has indicated his/her understanding and acceptance.   Dental advisory given  Plan Discussed with: CRNA, Anesthesiologist and Surgeon  Anesthesia Plan Comments:        Anesthesia Quick Evaluation

## 2018-01-03 NOTE — Progress Notes (Signed)
EVENING ROUNDS NOTE :     [address]       [place] [address]             [phone]                 Day of Surgery Procedure(s) (LRB): AORTIC VALVE REPLACEMENT (AVR) using 61mm Magna Ease Bioprosthesis Aortic Valve (N/A) CORONARY ARTERY BYPASS GRAFTING (CABG) x 4 (LIMA to LAD, SVG to DIAGONAL, SVG to RAMUS INTERMEDIATE, and SVG to PDA), USING LEFT INFTERNAL MAMMARY ARTERY AND (N/A) TRANSESOPHAGEAL ECHOCARDIOGRAM (TEE) (N/A) ENDARTERECTOMY CAROTID (Right)  Total Length of Stay:  LOS: 0 days  BP 96/68   Pulse 84   Temp 98.4 F (36.9 C)   Resp 16   SpO2 100%   .Intake/Output      12/16 0701 - 12/17 0700   I.V. 4000   Blood 2086   IV Piggyback 1000   Total Intake 7086   Urine 2530   Blood 1800   Total Output 4330   Net +2756         . sodium chloride    . [START ON 01/04/2018] sodium chloride    . sodium chloride 10 mL/hr at 01/03/18 1900  . albumin human    . amiodarone 30 mg/hr (01/03/18 1900)   Followed by  . [START ON 01/04/2018] amiodarone    . cefUROXime (ZINACEF)  IV    . dexmedetomidine (PRECEDEX) IV infusion    . famotidine (PEPCID) IV    . insulin    . lactated ringers    . lactated ringers    . lactated ringers    . magnesium sulfate    . milrinone    . nitroGLYCERIN    . phenylephrine (NEO-SYNEPHRINE) Adult infusion    . potassium chloride 10 mEq (01/03/18 1935)  . vancomycin       Lab Results  Component Value Date   WBC 24.9 (H) 01/03/2018   HGB 10.0 (L) 01/03/2018   HCT 31.0 (L) 01/03/2018   PLT 144 (L) 01/03/2018   GLUCOSE 130 (H) 01/03/2018   ALT 18 12/30/2017   AST 36 12/30/2017   NA 137 01/03/2018   K 4.0 01/03/2018   CL 104 01/03/2018   CREATININE 0.70 01/03/2018   BUN 7 (L) 01/03/2018   CO2 20 (L) 12/30/2017   INR 1.00 01/03/2018   HGBA1C 5.9 (H) 12/30/2017   Early postop, sedated on vent  Respiratory acidosis, increased vent rate and tv, repeat abg Supported on neo, levophed, milrinone    Grace Isaac MD  Beeper [phone] Office [phone] 01/03/2018 8:28 PM

## 2018-01-03 NOTE — Anesthesia Procedure Notes (Signed)
Arterial Line Insertion Start/End12/16/2019 7:00 AM, 01/03/2018 7:10 AM Performed by: Gaylene Brooks, CRNA, CRNA  Patient location: Pre-op. Preanesthetic checklist: patient identified, IV checked, site marked, risks and benefits discussed, surgical consent, monitors and equipment checked, pre-op evaluation, timeout performed and anesthesia consent Lidocaine 1% used for infiltration and patient sedated Right, radial was placed Catheter size: 20 G Hand hygiene performed  and maximum sterile barriers used   Attempts: 1 Procedure performed without using ultrasound guided technique.

## 2018-01-03 NOTE — Anesthesia Postprocedure Evaluation (Signed)
Anesthesia Post Note  Patient: Justin [name]  Procedure(s) Performed: AORTIC VALVE REPLACEMENT (AVR) using 88mm Magna Ease Bioprosthesis Aortic Valve (N/A Chest) CORONARY ARTERY BYPASS GRAFTING (CABG) x 4 (LIMA to LAD, SVG to DIAGONAL, SVG to RAMUS INTERMEDIATE, and SVG to PDA), USING LEFT INFTERNAL MAMMARY ARTERY AND (N/A Chest) TRANSESOPHAGEAL ECHOCARDIOGRAM (TEE) (N/A ) ENDARTERECTOMY CAROTID (Right )     Patient location during evaluation: SICU Anesthesia Type: General Level of consciousness: sedated and patient remains intubated per anesthesia plan Pain management: pain level controlled Vital Signs Assessment: post-procedure vital signs reviewed and stable Respiratory status: patient remains intubated per anesthesia plan and patient on ventilator - see flowsheet for VS Cardiovascular status: stable and blood pressure returned to baseline Postop Assessment: no apparent nausea or vomiting Anesthetic complications: no    Last Vitals:  Vitals:   01/03/18 2025 01/03/18 2100  BP:  101/72  Pulse:  88  Resp:  (!) 23  Temp:  36.9 C  SpO2: 98% 98%    Last Pain:  Vitals:   01/03/18 0614  PainSc: 0-No pain                 Eldredge Veldhuizen COKER

## 2018-01-04 ENCOUNTER — Telehealth: Payer: Self-pay | Admitting: Internal Medicine

## 2018-01-04 ENCOUNTER — Inpatient Hospital Stay (HOSPITAL_COMMUNITY): Payer: BLUE CROSS/BLUE SHIELD

## 2018-01-04 ENCOUNTER — Other Ambulatory Visit: Payer: Self-pay

## 2018-01-04 ENCOUNTER — Encounter (HOSPITAL_COMMUNITY): Payer: Self-pay | Admitting: Cardiothoracic Surgery

## 2018-01-04 LAB — BPAM FFP
BLOOD PRODUCT EXPIRATION DATE: 201912182359
Blood Product Expiration Date: 201912182359
ISSUE DATE / TIME: 201912161501
ISSUE DATE / TIME: 201912161501
Unit Type and Rh: 6200
Unit Type and Rh: 6200

## 2018-01-04 LAB — POCT I-STAT, CHEM 8
BUN: 10 mg/dL (ref 8–23)
BUN: 7 mg/dL — ABNORMAL LOW (ref 8–23)
BUN: 7 mg/dL — ABNORMAL LOW (ref 8–23)
BUN: 8 mg/dL (ref 8–23)
BUN: 8 mg/dL (ref 8–23)
BUN: 8 mg/dL (ref 8–23)
BUN: 8 mg/dL (ref 8–23)
BUN: 9 mg/dL (ref 8–23)
BUN: 9 mg/dL (ref 8–23)
Calcium, Ion: 1.07 mmol/L — ABNORMAL LOW (ref 1.15–1.40)
Calcium, Ion: 1.08 mmol/L — ABNORMAL LOW (ref 1.15–1.40)
Calcium, Ion: 1.11 mmol/L — ABNORMAL LOW (ref 1.15–1.40)
Calcium, Ion: 1.12 mmol/L — ABNORMAL LOW (ref 1.15–1.40)
Calcium, Ion: 1.15 mmol/L (ref 1.15–1.40)
Calcium, Ion: 1.18 mmol/L (ref 1.15–1.40)
Calcium, Ion: 1.2 mmol/L (ref 1.15–1.40)
Calcium, Ion: 1.21 mmol/L (ref 1.15–1.40)
Calcium, Ion: 1.24 mmol/L (ref 1.15–1.40)
Chloride: 101 mmol/L (ref 98–111)
Chloride: 102 mmol/L (ref 98–111)
Chloride: 107 mmol/L (ref 98–111)
Chloride: 98 mmol/L (ref 98–111)
Chloride: 98 mmol/L (ref 98–111)
Chloride: 98 mmol/L (ref 98–111)
Chloride: 98 mmol/L (ref 98–111)
Chloride: 98 mmol/L (ref 98–111)
Chloride: 99 mmol/L (ref 98–111)
Creatinine, Ser: 0.5 mg/dL — ABNORMAL LOW (ref 0.61–1.24)
Creatinine, Ser: 0.6 mg/dL — ABNORMAL LOW (ref 0.61–1.24)
Creatinine, Ser: 0.8 mg/dL (ref 0.61–1.24)
Creatinine, Ser: 0.9 mg/dL (ref 0.61–1.24)
Creatinine, Ser: 1 mg/dL (ref 0.61–1.24)
Creatinine, Ser: 1 mg/dL (ref 0.61–1.24)
Creatinine, Ser: 1.3 mg/dL — ABNORMAL HIGH (ref 0.61–1.24)
Creatinine, Ser: 1.6 mg/dL — ABNORMAL HIGH (ref 0.61–1.24)
Creatinine, Ser: 2.2 mg/dL — ABNORMAL HIGH (ref 0.61–1.24)
Glucose, Bld: 121 mg/dL — ABNORMAL HIGH (ref 70–99)
Glucose, Bld: 135 mg/dL — ABNORMAL HIGH (ref 70–99)
Glucose, Bld: 139 mg/dL — ABNORMAL HIGH (ref 70–99)
Glucose, Bld: 150 mg/dL — ABNORMAL HIGH (ref 70–99)
Glucose, Bld: 152 mg/dL — ABNORMAL HIGH (ref 70–99)
Glucose, Bld: 168 mg/dL — ABNORMAL HIGH (ref 70–99)
Glucose, Bld: 179 mg/dL — ABNORMAL HIGH (ref 70–99)
Glucose, Bld: 253 mg/dL — ABNORMAL HIGH (ref 70–99)
Glucose, Bld: 99 mg/dL (ref 70–99)
HCT: 26 % — ABNORMAL LOW (ref 39.0–52.0)
HCT: 27 % — ABNORMAL LOW (ref 39.0–52.0)
HCT: 28 % — ABNORMAL LOW (ref 39.0–52.0)
HCT: 29 % — ABNORMAL LOW (ref 39.0–52.0)
HCT: 30 % — ABNORMAL LOW (ref 39.0–52.0)
HCT: 33 % — ABNORMAL LOW (ref 39.0–52.0)
HCT: 39 % (ref 39.0–52.0)
HCT: 42 % (ref 39.0–52.0)
HCT: 46 % (ref 39.0–52.0)
Hemoglobin: 10.2 g/dL — ABNORMAL LOW (ref 13.0–17.0)
Hemoglobin: 11.2 g/dL — ABNORMAL LOW (ref 13.0–17.0)
Hemoglobin: 13.3 g/dL (ref 13.0–17.0)
Hemoglobin: 14.3 g/dL (ref 13.0–17.0)
Hemoglobin: 15.6 g/dL (ref 13.0–17.0)
Hemoglobin: 8.8 g/dL — ABNORMAL LOW (ref 13.0–17.0)
Hemoglobin: 9.2 g/dL — ABNORMAL LOW (ref 13.0–17.0)
Hemoglobin: 9.5 g/dL — ABNORMAL LOW (ref 13.0–17.0)
Hemoglobin: 9.9 g/dL — ABNORMAL LOW (ref 13.0–17.0)
Potassium: 4 mmol/L (ref 3.5–5.1)
Potassium: 4.3 mmol/L (ref 3.5–5.1)
Potassium: 4.3 mmol/L (ref 3.5–5.1)
Potassium: 4.4 mmol/L (ref 3.5–5.1)
Potassium: 4.4 mmol/L (ref 3.5–5.1)
Potassium: 4.5 mmol/L (ref 3.5–5.1)
Potassium: 4.5 mmol/L (ref 3.5–5.1)
Potassium: 4.5 mmol/L (ref 3.5–5.1)
Potassium: 4.6 mmol/L (ref 3.5–5.1)
Sodium: 130 mmol/L — ABNORMAL LOW (ref 135–145)
Sodium: 131 mmol/L — ABNORMAL LOW (ref 135–145)
Sodium: 134 mmol/L — ABNORMAL LOW (ref 135–145)
Sodium: 134 mmol/L — ABNORMAL LOW (ref 135–145)
Sodium: 135 mmol/L (ref 135–145)
Sodium: 135 mmol/L (ref 135–145)
Sodium: 135 mmol/L (ref 135–145)
Sodium: 135 mmol/L (ref 135–145)
Sodium: 139 mmol/L (ref 135–145)
TCO2: 22 mmol/L (ref 22–32)
TCO2: 23 mmol/L (ref 22–32)
TCO2: 25 mmol/L (ref 22–32)
TCO2: 26 mmol/L (ref 22–32)
TCO2: 27 mmol/L (ref 22–32)
TCO2: 28 mmol/L (ref 22–32)
TCO2: 28 mmol/L (ref 22–32)
TCO2: 28 mmol/L (ref 22–32)
TCO2: 31 mmol/L (ref 22–32)

## 2018-01-04 LAB — GLUCOSE, CAPILLARY
Glucose-Capillary: 104 mg/dL — ABNORMAL HIGH (ref 70–99)
Glucose-Capillary: 104 mg/dL — ABNORMAL HIGH (ref 70–99)
Glucose-Capillary: 104 mg/dL — ABNORMAL HIGH (ref 70–99)
Glucose-Capillary: 105 mg/dL — ABNORMAL HIGH (ref 70–99)
Glucose-Capillary: 106 mg/dL — ABNORMAL HIGH (ref 70–99)
Glucose-Capillary: 126 mg/dL — ABNORMAL HIGH (ref 70–99)
Glucose-Capillary: 130 mg/dL — ABNORMAL HIGH (ref 70–99)
Glucose-Capillary: 131 mg/dL — ABNORMAL HIGH (ref 70–99)
Glucose-Capillary: 140 mg/dL — ABNORMAL HIGH (ref 70–99)
Glucose-Capillary: 165 mg/dL — ABNORMAL HIGH (ref 70–99)
Glucose-Capillary: 177 mg/dL — ABNORMAL HIGH (ref 70–99)
Glucose-Capillary: 199 mg/dL — ABNORMAL HIGH (ref 70–99)
Glucose-Capillary: 246 mg/dL — ABNORMAL HIGH (ref 70–99)
Glucose-Capillary: 86 mg/dL (ref 70–99)
Glucose-Capillary: 96 mg/dL (ref 70–99)
Glucose-Capillary: 97 mg/dL (ref 70–99)
Glucose-Capillary: 97 mg/dL (ref 70–99)

## 2018-01-04 LAB — POCT I-STAT 3, ART BLOOD GAS (G3+)
Acid-Base Excess: 1 mmol/L (ref 0.0–2.0)
Acid-Base Excess: 1 mmol/L (ref 0.0–2.0)
Acid-base deficit: 3 mmol/L — ABNORMAL HIGH (ref 0.0–2.0)
Acid-base deficit: 7 mmol/L — ABNORMAL HIGH (ref 0.0–2.0)
Bicarbonate: 16.8 mmol/L — ABNORMAL LOW (ref 20.0–28.0)
Bicarbonate: 21.2 mmol/L (ref 20.0–28.0)
Bicarbonate: 24.1 mmol/L (ref 20.0–28.0)
Bicarbonate: 24.2 mmol/L (ref 20.0–28.0)
Bicarbonate: 24.7 mmol/L (ref 20.0–28.0)
Bicarbonate: 26.9 mmol/L (ref 20.0–28.0)
O2 Saturation: 100 %
O2 Saturation: 95 %
O2 Saturation: 96 %
O2 Saturation: 98 %
O2 Saturation: 99 %
O2 Saturation: 99 %
Patient temperature: 36.6
Patient temperature: 36.9
Patient temperature: 37
Patient temperature: 37.3
Patient temperature: 98.1
TCO2: 18 mmol/L — ABNORMAL LOW (ref 22–32)
TCO2: 22 mmol/L (ref 22–32)
TCO2: 25 mmol/L (ref 22–32)
TCO2: 25 mmol/L (ref 22–32)
TCO2: 26 mmol/L (ref 22–32)
TCO2: 28 mmol/L (ref 22–32)
pCO2 arterial: 26.5 mmHg — ABNORMAL LOW (ref 32.0–48.0)
pCO2 arterial: 32.5 mmHg (ref 32.0–48.0)
pCO2 arterial: 36.3 mmHg (ref 32.0–48.0)
pCO2 arterial: 37.7 mmHg (ref 32.0–48.0)
pCO2 arterial: 37.8 mmHg (ref 32.0–48.0)
pCO2 arterial: 46.8 mmHg (ref 32.0–48.0)
pH, Arterial: 7.368 (ref 7.350–7.450)
pH, Arterial: 7.407 (ref 7.350–7.450)
pH, Arterial: 7.412 (ref 7.350–7.450)
pH, Arterial: 7.414 (ref 7.350–7.450)
pH, Arterial: 7.421 (ref 7.350–7.450)
pH, Arterial: 7.439 (ref 7.350–7.450)
pO2, Arterial: 104 mmHg (ref 83.0–108.0)
pO2, Arterial: 128 mmHg — ABNORMAL HIGH (ref 83.0–108.0)
pO2, Arterial: 157 mmHg — ABNORMAL HIGH (ref 83.0–108.0)
pO2, Arterial: 449 mmHg — ABNORMAL HIGH (ref 83.0–108.0)
pO2, Arterial: 73 mmHg — ABNORMAL LOW (ref 83.0–108.0)
pO2, Arterial: 78 mmHg — ABNORMAL LOW (ref 83.0–108.0)

## 2018-01-04 LAB — PREPARE PLATELET PHERESIS: UNIT DIVISION: 0

## 2018-01-04 LAB — CBC
HCT: 30.6 % — ABNORMAL LOW (ref 39.0–52.0)
HCT: 31.9 % — ABNORMAL LOW (ref 39.0–52.0)
HCT: 34.1 % — ABNORMAL LOW (ref 39.0–52.0)
HEMOGLOBIN: 10.4 g/dL — AB (ref 13.0–17.0)
Hemoglobin: 11.1 g/dL — ABNORMAL LOW (ref 13.0–17.0)
Hemoglobin: 9.6 g/dL — ABNORMAL LOW (ref 13.0–17.0)
MCH: 28.1 pg (ref 26.0–34.0)
MCH: 28.2 pg (ref 26.0–34.0)
MCH: 28.5 pg (ref 26.0–34.0)
MCHC: 31.4 g/dL (ref 30.0–36.0)
MCHC: 32.6 g/dL (ref 30.0–36.0)
MCHC: 32.6 g/dL (ref 30.0–36.0)
MCV: 86.5 fL (ref 80.0–100.0)
MCV: 87.4 fL (ref 80.0–100.0)
MCV: 89.5 fL (ref 80.0–100.0)
Platelets: 190 10*3/uL (ref 150–400)
Platelets: 199 10*3/uL (ref 150–400)
Platelets: 213 10*3/uL (ref 150–400)
RBC: 3.42 MIL/uL — AB (ref 4.22–5.81)
RBC: 3.65 MIL/uL — ABNORMAL LOW (ref 4.22–5.81)
RBC: 3.94 MIL/uL — ABNORMAL LOW (ref 4.22–5.81)
RDW: 27.8 % — ABNORMAL HIGH (ref 11.5–15.5)
RDW: 27.9 % — ABNORMAL HIGH (ref 11.5–15.5)
RDW: 29 % — ABNORMAL HIGH (ref 11.5–15.5)
WBC: 32.5 10*3/uL — ABNORMAL HIGH (ref 4.0–10.5)
WBC: 33.2 10*3/uL — ABNORMAL HIGH (ref 4.0–10.5)
WBC: 34.9 10*3/uL — ABNORMAL HIGH (ref 4.0–10.5)
nRBC: 0.3 % — ABNORMAL HIGH (ref 0.0–0.2)
nRBC: 0.3 % — ABNORMAL HIGH (ref 0.0–0.2)
nRBC: 0.5 % — ABNORMAL HIGH (ref 0.0–0.2)

## 2018-01-04 LAB — POCT I-STAT 4, (NA,K, GLUC, HGB,HCT)
Glucose, Bld: 80 mg/dL (ref 70–99)
HCT: 28 % — ABNORMAL LOW (ref 39.0–52.0)
Hemoglobin: 9.5 g/dL — ABNORMAL LOW (ref 13.0–17.0)
Potassium: 3.7 mmol/L (ref 3.5–5.1)
Sodium: 140 mmol/L (ref 135–145)

## 2018-01-04 LAB — BASIC METABOLIC PANEL
Anion gap: 9 (ref 5–15)
BUN: 8 mg/dL (ref 8–23)
CO2: 22 mmol/L (ref 22–32)
Calcium: 7.8 mg/dL — ABNORMAL LOW (ref 8.9–10.3)
Chloride: 108 mmol/L (ref 98–111)
Creatinine, Ser: 0.63 mg/dL (ref 0.61–1.24)
GFR calc Af Amer: >60 mL/min (ref >60)
GFR calc non Af Amer: >60 mL/min (ref >60)
Glucose, Bld: 80 mg/dL (ref 70–99)
Potassium: 4.3 mmol/L (ref 3.5–5.1)
SODIUM: 139 mmol/L (ref 135–145)

## 2018-01-04 LAB — POCT ACTIVATED CLOTTING TIME
Activated Clotting Time: 200 seconds
Activated Clotting Time: 224 seconds

## 2018-01-04 LAB — BPAM PLATELET PHERESIS
Blood Product Expiration Date: 201912162359
ISSUE DATE / TIME: 201912161501
Unit Type and Rh: 5100

## 2018-01-04 LAB — MAGNESIUM
MAGNESIUM: 2.3 mg/dL (ref 1.7–2.4)
Magnesium: 2 mg/dL (ref 1.7–2.4)
Magnesium: 2.9 mg/dL — ABNORMAL HIGH (ref 1.7–2.4)

## 2018-01-04 LAB — CREATININE, SERUM
Creatinine, Ser: 0.64 mg/dL (ref 0.61–1.24)
Creatinine, Ser: 0.89 mg/dL (ref 0.61–1.24)
GFR calc Af Amer: >60 mL/min (ref >60)
GFR calc non Af Amer: >60 mL/min (ref >60)
GFR calc non Af Amer: >60 mL/min (ref >60)

## 2018-01-04 LAB — PREPARE FRESH FROZEN PLASMA
UNIT DIVISION: 0
Unit division: 0

## 2018-01-04 LAB — COOXEMETRY PANEL
Carboxyhemoglobin: 1.2 % (ref 0.5–1.5)
Methemoglobin: 1.6 % — ABNORMAL HIGH (ref 0.0–1.5)
O2 Saturation: 59 %
Total hemoglobin: 9.8 g/dL — ABNORMAL LOW (ref 12.0–16.0)

## 2018-01-04 MED ORDER — TAMSULOSIN HCL 0.4 MG PO CAPS: 0.4000 mg | ORAL_CAPSULE | Freq: Every day | ORAL | Status: DC

## 2018-01-04 MED ORDER — SODIUM BICARBONATE 8.4 % IV SOLN
50.0000 meq | Freq: Once | INTRAVENOUS | Status: AC
  Administered 2018-01-04: 50 meq via INTRAVENOUS
  Filled 2018-01-04: qty 50

## 2018-01-04 MED ORDER — CHLORHEXIDINE GLUCONATE CLOTH 2 % EX PADS
6.0000 | MEDICATED_PAD | Freq: Every day | CUTANEOUS | Status: AC
  Administered 2018-01-04 – 2018-01-08 (×4): 6 via TOPICAL

## 2018-01-04 MED ORDER — ORAL CARE MOUTH RINSE
15.0000 mL | Freq: Two times a day (BID) | OROMUCOSAL | Status: DC
  Administered 2018-01-05 – 2018-01-08 (×2): 15 mL via OROMUCOSAL

## 2018-01-04 MED ORDER — FUROSEMIDE 10 MG/ML IJ SOLN
10.0000 mg/h | INTRAVENOUS | Status: DC
  Administered 2018-01-04: 8 mg/h via INTRAVENOUS
  Administered 2018-01-05 – 2018-01-06 (×2): 10 mg/h via INTRAVENOUS
  Filled 2018-01-04: qty 25
  Filled 2018-01-04: qty 21
  Filled 2018-01-04 (×2): qty 25

## 2018-01-04 MED ORDER — MUPIROCIN 2 % EX OINT
1.0000 "application " | TOPICAL_OINTMENT | Freq: Two times a day (BID) | CUTANEOUS | Status: AC
  Administered 2018-01-04 – 2018-01-08 (×10): 1 via NASAL
  Filled 2018-01-04 (×3): qty 22

## 2018-01-04 MED ORDER — PHENYLEPHRINE HCL-NACL 40-0.9 MG/250ML-% IV SOLN
0.0000 ug/min | INTRAVENOUS | Status: DC
  Filled 2018-01-04: qty 250

## 2018-01-04 MED ORDER — ORAL CARE MOUTH RINSE
15.0000 mL | OROMUCOSAL | Status: DC
  Administered 2018-01-04 (×8): 15 mL via OROMUCOSAL

## 2018-01-04 MED ORDER — NOREPINEPHRINE 16 MG/250ML-% IV SOLN
3.0000 ug/min | INTRAVENOUS | Status: DC
  Administered 2018-01-04: 2 ug/min via INTRAVENOUS
  Administered 2018-01-04: 6 ug/min via INTRAVENOUS
  Filled 2018-01-04: qty 250

## 2018-01-04 MED ORDER — INSULIN GLARGINE 100 UNIT/ML ~~LOC~~ SOLN
14.0000 [IU] | Freq: Two times a day (BID) | SUBCUTANEOUS | Status: DC
  Administered 2018-01-04 – 2018-01-05 (×3): 14 [IU] via SUBCUTANEOUS
  Filled 2018-01-04 (×4): qty 0.14

## 2018-01-04 MED ORDER — INSULIN ASPART 100 UNIT/ML ~~LOC~~ SOLN
0.0000 [IU] | SUBCUTANEOUS | Status: DC
  Administered 2018-01-04: 8 [IU] via SUBCUTANEOUS
  Administered 2018-01-04 (×3): 4 [IU] via SUBCUTANEOUS
  Administered 2018-01-05 (×4): 2 [IU] via SUBCUTANEOUS

## 2018-01-04 MED ORDER — CHLORHEXIDINE GLUCONATE 0.12% ORAL RINSE (MEDLINE KIT)
15.0000 mL | Freq: Two times a day (BID) | OROMUCOSAL | Status: DC
  Administered 2018-01-04 (×2): 15 mL via OROMUCOSAL

## 2018-01-04 MED ORDER — FUROSEMIDE 10 MG/ML IJ SOLN
40.0000 mg | Freq: Once | INTRAMUSCULAR | Status: AC
  Administered 2018-01-04: 40 mg via INTRAVENOUS
  Filled 2018-01-04: qty 4

## 2018-01-04 MED ORDER — FINASTERIDE 5 MG PO TABS
5.0000 mg | ORAL_TABLET | Freq: Every day | ORAL | Status: DC
  Administered 2018-01-05 – 2018-01-10 (×6): 5 mg via ORAL
  Filled 2018-01-04 (×7): qty 1

## 2018-01-04 MED ORDER — AMIODARONE IV BOLUS ONLY 150 MG/100ML
150.0000 mg | Freq: Once | INTRAVENOUS | Status: AC
  Administered 2018-01-04: 150 mg via INTRAVENOUS

## 2018-01-04 MED FILL — Potassium Chloride Inj 2 mEq/ML: INTRAVENOUS | Qty: 40 | Status: AC

## 2018-01-04 MED FILL — Dexmedetomidine HCl in NaCl 0.9% IV Soln 400 MCG/100ML: INTRAVENOUS | Qty: 100 | Status: AC

## 2018-01-04 MED FILL — Heparin Sodium (Porcine) Inj 1000 Unit/ML: INTRAMUSCULAR | Qty: 30 | Status: AC

## 2018-01-04 MED FILL — Magnesium Sulfate Inj 50%: INTRAMUSCULAR | Qty: 10 | Status: AC

## 2018-01-04 NOTE — Progress Notes (Signed)
[facility] BRIEF SICU PROGRESS NOTE  1 Day Post-Op  S/P Procedure(s) (LRB): AORTIC VALVE REPLACEMENT (AVR) using 83mm Magna Ease Bioprosthesis Aortic Valve (N/A) CORONARY ARTERY BYPASS GRAFTING (CABG) x 4 (LIMA to LAD, SVG to DIAGONAL, SVG to RAMUS INTERMEDIATE, and SVG to PDA), USING LEFT INFTERNAL MAMMARY ARTERY AND (N/A) TRANSESOPHAGEAL ECHOCARDIOGRAM (TEE) (N/A) ENDARTERECTOMY CAROTID (Right)   Up in chair Afib w/ HR 110 MAP 70's on levophed, milrinone and amiodarone drips, co-ox 59% Breathing comfortably w/ O2 sats 98% on 2 L/min UOP adequate Labs okay  Plan: Continue current plan  Rexene Alberts, MD 01/04/2018 7:12 PM

## 2018-01-04 NOTE — Progress Notes (Addendum)
Referred to Dr. Nona Dell consult note. Noted that pt's BP cuff on R arm reads higher than BP cuff on L arm. Moved pt's BP cuff and now more closely correlates with ART. MAP remains correlating in both arms in comparison to ART.

## 2018-01-04 NOTE — Op Note (Signed)
NAME: Justin [name], Justin [name] MEDICAL RECORD ZO:[id number] ACCOUNT [id number] DATE OF BIRTH:November 27, 1955 FACILITY: [facility] LOCATION: [facility] PHYSICIAN:Shyrl Obi VAN TRIGT III, MD  OPERATIVE REPORT  DATE OF PROCEDURE:  01/03/2018  OPERATION: 1.  Coronary artery bypass grafting x4 (left internal mammary artery to left anterior descending, saphenous vein graft to diagonal, saphenous vein graft to OM1, saphenous vein graft to posterior descending). 2.  Aortic valve replacement with a 23 mm Edwards pericardial valve, serial [id number]. 3.  Application of left atrial clip, AtriCure 35 mm. 4.  Endoscopic harvest of right leg greater saphenous vein.  SURGEON:  Tharon Aquas Trigt III, MD  ASSISTANT:  Lars Pinks, PA-C  ANESTHESIA:  General.  PREOPERATIVE DIAGNOSES:  Moderate to severe aortic stenosis, severe 3-vessel coronary artery disease, moderate left ventricular dysfunction, ejection fraction 40%, preoperative history of 90% right carotid stenosis.  POSTOPERATIVE DIAGNOSES:  Moderate to severe aortic stenosis, severe 3-vessel coronary artery disease, moderate left ventricular dysfunction, ejection fraction 40%, preoperative history of 90% right carotid stenosis.  CLINICAL NOTE:  The patient is a 62 year old gentleman with history of smoking and myeloproliferative bone marrow disorder.  He has been followed by cardiology for murmur of aortic stenosis with serial echocardiograms.  His aortic stenosis was felt to be  moderate on his last echo and stable.  However, his LV function showed a decrease, and for that reason, surgical evaluation was requested.  The patient subsequently underwent right and left heart catheterization which showed severe 3-vessel coronary  artery disease, preserved LV hemodynamics with decreased ejection fraction of 40% to 45%.  The aortic stenosis by TEE was felt to be moderate to severe with a peak gradient of 60 mmHg.  The patient was recommended for AVR CABG and possible  treatment of  his onset of atrial fibrillation.  During the preoperative evaluation, a high-grade right carotid stenosis of 90% was noted, and at that point, a combined right carotid endarterectomy with AVR CABG was planned.  I had met with the patient on several  occasions, reviewing his cardiology problems as well as his bone marrow disorder.  I discussed the details of surgery including the choice of a bioprosthetic valve and the location of the incisions, the use of general anesthesia and cardiopulmonary  bypass.  I discussed with him and his wife the potential risks of the surgery including the risk of stroke, bleeding, infection, postoperative pulmonary problems, postoperative arrhythmias, postoperative organ failure, and postoperative death.  The  patient understood that the procedure would be planned as a combined right carotid endarterectomy performed by Dr. Carlis Abbott and that I would perform the cardiac operation.  The surgery was coordinated between myself and vascular surgery, and the patient  understood these issues and agreed to proceed with surgery as noted above.  OPERATIVE FINDINGS: 1.  Severe calcified aortic stenosis of the tricuspid valve, treated successfully with a 23 mm bioprosthetic valve. 2.  Preserved LV systolic function after separation from cardiopulmonary bypass. 3.  Perioperative coagulopathy requiring transfusions and multiple blood products without impact on the coagulopathy, finally treated successfully with a dose of factor VII.  DESCRIPTION OF PROCEDURE:  The patient was brought to the operating room and placed supine on the operating table.  General anesthesia was induced under invasive hemodynamic monitoring.  The neck, chest, abdomen and legs were prepped with Betadine,  draped as a sterile field.  A transesophageal echo probe was placed by the anesthesia team.  A proper time-out was performed.  The right carotid endarterectomy procedure will be dictated in  a  separate note by Dr. Fortunato Curling.  After the endarterectomy was performed, a repeat time-out was performed.  A sternal incision was made as the saphenous vein was harvested endoscopically from the right leg.  The left internal mammary artery was harvested as a pedicle graft from its  origin at the subclavian vessels.  It was noted at the time of sternotomy there was excessive bleeding from the sternal bone, which did not respond to the standard topical measures.  The sternal retractor was placed, and the pericardium was opened and suspended.  Pursestrings were placed in the ascending aorta and right atrium.  Heparin was administered and ACT was therapeutic.  The patient was cannulated and placed on  cardiopulmonary bypass.  A left ventricular vent was placed via the right superior pulmonary vein.  The distal coronary targets were identified and dissected.  The mammary artery and vein grafts were prepared for the distal anastomosis.  Cardioplegia  cannulas were placed both antegrade and retrograde with cold blood cardioplegia.  The patient was cooled to 32 degrees, and the aortic crossclamp was applied.  One liter of cold blood cardioplegia was delivered with good cardioplegic arrest and supple  temperature dropped less than 14 degrees.  Cardioplegia was delivered every 20 minutes.  The distal coronary anastomoses were performed.  Overall, the coronaries were heavily calcified and suboptimal targets.  The first distal anastomosis was the posterior descending.  This was heavily calcified and had a high-grade proximal 90% stenosis.   Reverse saphenous vein was sewn end-to-side with running 7-0 Prolene with good flow through the graft.  Cardioplegia was redosed.  The second distal anastomosis was to the OM1-ramus branch of left circumflex.  This was a heavily calcified 1.5 mm vessel, proximal 80% stenosis.  Reverse saphenous vein was sewn end-to-side with running 7-0 Prolene with good flow through the  graft.  The third distal anastomosis was to the diagonal branch of the LAD.  This had an ostial 90% stenosis.  Reverse saphenous vein was sewn end-to-side with running 7-0 Prolene with good flow through the graft.  Cardioplegia was redosed.  The fourth distal anastomosis was the distal third LAD.  There was a proximal 90% stenosis.  The left IMA pedicle was brought through an opening in the left lateral pericardium and was brought down onto the LAD and sewn end-to-side with running 8-0  Prolene.  There was good flow through the anastomosis after briefly releasing the pedicle bulldog on the mammary artery.  The bulldog was reapplied.  The pedicle was secured to the epicardium.  Cardioplegia was redosed.  Attention was then directed to the aortic valve.  An aortotomy was performed.  There was very heavy calcification in the aorta, almost a porcelain aorta situation.  The aortic valve was exposed.  It was heavily calcified.  The leaflets and annular  calcium were debrided.  The outflow tract was irrigated with cold saline.  The annulus was measured to a 23 mm Magna Ease valve.  Subannular 2-0 pledgeted sutures were placed around the annulus.  The prosthesis was prepared according to protocol, and the  sutures were placed into the sewing ring of the valve.  The valve seated and the sutures were tied.  The valve conformed to the annulus well, and there was no obstruction of the coronary ostium.  The aortotomy was closed in layers using running 4-0  Prolene.  Two layers of a running 4-0 Prolene were used to close the calcified aorta.  After cardioplegia was  redosed, 2 proximal vein anastomoses were performed on the ascending aorta.  The ascending aorta was heavily diseased, and a third anastomosis was not possible.  For that reason, the vein from the ramus intermedius was placed to  the hood of the diagonal vein and a vein-to-vein anastomosis.  The right coronary graft had its own separate anastomosis to the  aorta.  After the proximal anastomoses were completed, a dose of retrograde warm blood cardioplegia was delivered and the  crossclamp was removed.  The AtriCure left atrial clip was applied to the left atrial appendage after measuring the appendages and applying appropriate-sized clip.  The patient was rewarmed and reperfused.  The vein grafts were deaired and opened.  Each had good flow.  Hemostasis was documented at the proximal and distal anastomoses.  The patient was rewarmed and temporary pacing wires were applied.  The lungs were  expanded and ventilator was resumed.  The patient was started on low-dose milrinone and weaned from cardiopulmonary bypass.  Cardiac function appeared to be well preserved.  Aortic valve functioned normally with minimal gradient and no paravalvular leak.   Cardiac output was normal.  Protamine was administered without adverse reaction.  There was severe coagulopathy.  There was hardly any effect on the bleeding from the protamine.  The patient was given FFP and platelets.  Fibrinogen was checked and  found to be normal.  The patient still did not improve coagulation.  A dose of DDAVP was given.  This did not affect the coagulopathy.  The patient was then given a half dose of factor VII.  This had significant improvement on the coagulopathy.  The  cannulas had been removed, the pericardium closed over the aorta, and anterior and posterior mediastinal drains were applied.  A left pleural drain was applied.  The sternum was closed.  The patient remained stable.  The pectoralis fascia was closed.   The patient remained stable.  The subcutaneous and skin layers were closed.  The patient remained stable.  The right neck was closed in layers using Vicryl over a small JP drain.  The patient was then transported back to the ICU in stable condition.  Total cardiopulmonary bypass time was 204 minutes.  LN/NUANCE  D:01/04/2018 T:01/04/2018 JOB:[id number]

## 2018-01-04 NOTE — Progress Notes (Addendum)
  Progress Note    01/04/2018 7:35 AM 1 Day Post-Op  Subjective:  Pt intubated and sedated; RN reports he wakes up and follows commands-moving all extremities   VSS Tm 99.3 now afebrile 100% .50 FiO2  Gtts: Insulin Milrinone Phenylephrine Amiodarone dexmedetomidine Levophed   Vitals:   01/04/18 0606 01/04/18 0700  BP:  106/60  Pulse: 88 87  Resp: 18 20  Temp: 98.4 F (36.9 C) 98.4 F (36.9 C)  SpO2: 100% 100%    Physical Exam: Lungs:  Intubated/sedated.   Incisions:  Bandage is dry  Extremities:  Moving all extremities   CBC    Component Value Date/Time   WBC 32.5 (H) 01/04/2018 0453   RBC 3.65 (L) 01/04/2018 0453   HGB 10.4 (L) 01/04/2018 0453   HGB 17.5 (H) 12/21/2017 0948   HCT 31.9 (L) 01/04/2018 0453   PLT 199 01/04/2018 0453   PLT 314 12/21/2017 0948   MCV 87.4 01/04/2018 0453   MCH 28.5 01/04/2018 0453   MCHC 32.6 01/04/2018 0453   RDW 27.9 (H) 01/04/2018 0453   LYMPHSABS 1.6 12/21/2017 0948   MONOABS 1.2 (H) 12/21/2017 0948   EOSABS 0.2 12/21/2017 0948   BASOSABS 0.4 (H) 12/21/2017 0948    BMET    Component Value Date/Time   NA 139 01/04/2018 0453   K 4.3 01/04/2018 0453   CL 108 01/04/2018 0453   CO2 22 01/04/2018 0453   GLUCOSE 80 01/04/2018 0453   BUN 8 01/04/2018 0453   CREATININE 0.63 01/04/2018 0453   CALCIUM 7.8 (L) 01/04/2018 0453   GFRNONAA >60 01/04/2018 0453   GFRAA >60 01/04/2018 0453    INR    Component Value Date/Time   INR 1.00 01/03/2018 1913     Intake/Output Summary (Last 24 hours) at 01/04/2018 0735 Last data filed at 01/04/2018 0700 Gross per 24 hour  Intake 9410.75 ml  Output 5662.5 ml  Net 3748.25 ml     Assessment:  62 y.o. male is s/p:  Right carotid endarterectomy and CABG  1 Day Post-Op  Plan: -pt neurologically in tact -neck JP drain with 22cc emptied this am-will most likely remove tomorrow -DVT prophylaxis:  Per primary team -plan hopeful for extubation today    Leontine Locket,  PA-C [facility] [phone] 01/04/2018 7:35 AM  I have seen and evaluated the patient. I agree with the PA note as documented above. POD#1 s/p R CEA.  Remains intubated on neo and levo.  Following commands this am and no overt deficits - can also stick out tongue.  Drain output 22 will likely remove tomorrow.  Swelling to neck but volume overloaded and no overt hematoma on my assessment.    Marty Heck, MD [facility] Office: [phone] Pager: [phone]

## 2018-01-04 NOTE — Procedures (Signed)
Extubation Procedure Note  Patient Details:   Name: Justin [name] DOB: 11/28/1955 MRN: [id number]   Airway Documentation:    Vent end date: 01/04/18 Vent end time: 0850   Evaluation  O2 sats: stable throughout Complications: No apparent complications Patient did tolerate procedure well. Bilateral Breath Sounds: Clear   Yes   Patient was extubated to a 4L  without any complications, dyspnea or stridor noted. Patient was instructed on IS, highest goal reached was 1,044mL.  NIF: -20, VC: 1.5L, positive cuff leak.   Dare Spillman, Eddie North 01/04/2018, 8:50 AM

## 2018-01-04 NOTE — Progress Notes (Signed)
1 Day Post-Op Procedure(s) (LRB): AORTIC VALVE REPLACEMENT (AVR) using 19mm Magna Ease Bioprosthesis Aortic Valve (N/A) CORONARY ARTERY BYPASS GRAFTING (CABG) x 4 (LIMA to LAD, SVG to DIAGONAL, SVG to RAMUS INTERMEDIATE, and SVG to PDA), USING LEFT INFTERNAL MAMMARY ARTERY AND (N/A) TRANSESOPHAGEAL ECHOCARDIOGRAM (TEE) (N/A) ENDARTERECTOMY CAROTID (Right) Subjective: Ready to wean from vent Plan diuresis with lasix drip and wean neo  Objective: Vital signs in last 24 hours: Temp:  [98.1 F (36.7 C)-99.5 F (37.5 C)] 98.2 F (36.8 C) (12/17 0800) Pulse Rate:  [72-92] 88 (12/17 0800) Cardiac Rhythm: Atrial paced (12/17 0400) Resp:  [12-26] 20 (12/17 0800) BP: (70-118)/(54-79) 105/64 (12/17 0800) SpO2:  [91 %-100 %] 100 % (12/17 0825) Arterial Line BP: (91-195)/(46-144) 115/54 (12/17 0800) FiO2 (%):  [8 %-100 %] 50 % (12/17 0825) Weight:  [94 kg] 94 kg (12/17 0500)  Hemodynamic parameters for last 24 hours: PAP: (19-36)/(12-18) 21/15 CO:  [3.9 L/min-4.9 L/min] 4.5 L/min CI:  [1.9 L/min/m2-2.4 L/min/m2] 2.2 L/min/m2  Intake/Output from previous day: 12/16 0701 - 12/17 0700 In: 9410.8 [I.V.:5573.9; Blood:2086; IV Piggyback:1750.8] Out: 5662.5 [Urine:3260; Emesis/NG output:150; Drains:22.5; Blood:1800; Chest Tube:430] Intake/Output this shift: Total I/O In: 0  Out: 100 [Emesis/NG output:100]       Exam    General- responsive on vent    Neck- no JVD, no cervical adenopathy palpable, no carotid bruit   Lungs- scattered rhonchi   Cor- regular rate and rhythm, no murmur , gallop   Abdomen- soft, non-tender   Extremities - warm, non-tender, minimal edema   Neuro- oriented, appropriate, no focal weakness   Lab Results: Recent Labs    01/04/18 0008 01/04/18 0453  WBC 33.2* 32.5*  HGB 11.1*  11.2* 10.4*  HCT 34.1*  33.0* 31.9*  PLT 213 199   BMET:  Recent Labs    01/04/18 0008 01/04/18 0453  NA 139 139  K 4.3 4.3  CL 107 108  CO2  --  22  GLUCOSE 99 80  BUN 9  8  CREATININE 0.64  0.50* 0.63  CALCIUM  --  7.8*    PT/INR:  Recent Labs    01/03/18 1913  LABPROT 13.1  INR 1.00   ABG    Component Value Date/Time   PHART 7.414 01/04/2018 0459   HCO3 24.2 01/04/2018 0459   TCO2 25 01/04/2018 0459   ACIDBASEDEF 2.0 01/03/2018 2056   O2SAT 99.0 01/04/2018 0459   CBG (last 3)  Recent Labs    01/04/18 0457 01/04/18 0557 01/04/18 0659  GLUCAP 97 106* 105*    Assessment/Plan: S/P Procedure(s) (LRB): AORTIC VALVE REPLACEMENT (AVR) using 4mm Magna Ease Bioprosthesis Aortic Valve (N/A) CORONARY ARTERY BYPASS GRAFTING (CABG) x 4 (LIMA to LAD, SVG to DIAGONAL, SVG to RAMUS INTERMEDIATE, and SVG to PDA), USING LEFT INFTERNAL MAMMARY ARTERY AND (N/A) TRANSESOPHAGEAL ECHOCARDIOGRAM (TEE) (N/A) ENDARTERECTOMY CAROTID (Right) Mobilize Diuresis extubate   LOS: 1 day    Justin [name] 01/04/2018

## 2018-01-04 NOTE — Telephone Encounter (Signed)
RR patient - moved from 12/27 to 12/26 with [facility]. Left message. Schedule mailed.

## 2018-01-05 ENCOUNTER — Inpatient Hospital Stay (HOSPITAL_COMMUNITY): Payer: BLUE CROSS/BLUE SHIELD

## 2018-01-05 ENCOUNTER — Telehealth: Payer: Self-pay | Admitting: Vascular Surgery

## 2018-01-05 ENCOUNTER — Inpatient Hospital Stay: Payer: Self-pay

## 2018-01-05 ENCOUNTER — Other Ambulatory Visit: Payer: Self-pay

## 2018-01-05 ENCOUNTER — Ambulatory Visit: Payer: Self-pay | Admitting: Cardiothoracic Surgery

## 2018-01-05 LAB — HEPATIC FUNCTION PANEL
ALT: 26 U/L (ref 0–44)
AST: 82 U/L — ABNORMAL HIGH (ref 15–41)
Albumin: 2.5 g/dL — ABNORMAL LOW (ref 3.5–5.0)
Alkaline Phosphatase: 54 U/L (ref 38–126)
Bilirubin, Direct: 0.3 mg/dL — ABNORMAL HIGH (ref 0.0–0.2)
Indirect Bilirubin: 0.8 mg/dL (ref 0.3–0.9)
Total Bilirubin: 1.1 mg/dL (ref 0.3–1.2)
Total Protein: 4.8 g/dL — ABNORMAL LOW (ref 6.5–8.1)

## 2018-01-05 LAB — CBC
HCT: 27.7 % — ABNORMAL LOW (ref 39.0–52.0)
Hemoglobin: 9.2 g/dL — ABNORMAL LOW (ref 13.0–17.0)
MCH: 29.3 pg (ref 26.0–34.0)
MCHC: 33.2 g/dL (ref 30.0–36.0)
MCV: 88.2 fL (ref 80.0–100.0)
Platelets: 220 10*3/uL (ref 150–400)
RBC: 3.14 MIL/uL — ABNORMAL LOW (ref 4.22–5.81)
RDW: 28.8 % — ABNORMAL HIGH (ref 11.5–15.5)
WBC: 37.3 10*3/uL — ABNORMAL HIGH (ref 4.0–10.5)
nRBC: 0.7 % — ABNORMAL HIGH (ref 0.0–0.2)

## 2018-01-05 LAB — GLUCOSE, CAPILLARY
Glucose-Capillary: 146 mg/dL — ABNORMAL HIGH (ref 70–99)
Glucose-Capillary: 120 mg/dL — ABNORMAL HIGH (ref 70–99)
Glucose-Capillary: 127 mg/dL — ABNORMAL HIGH (ref 70–99)
Glucose-Capillary: 123 mg/dL — ABNORMAL HIGH (ref 70–99)
Glucose-Capillary: 132 mg/dL — ABNORMAL HIGH (ref 70–99)

## 2018-01-05 LAB — BASIC METABOLIC PANEL
Anion gap: 10 (ref 5–15)
Anion gap: 10 (ref 5–15)
BUN: 13 mg/dL (ref 8–23)
BUN: 15 mg/dL (ref 8–23)
CO2: 24 mmol/L (ref 22–32)
CO2: 25 mmol/L (ref 22–32)
Calcium: 8 mg/dL — ABNORMAL LOW (ref 8.9–10.3)
Calcium: 7.9 mg/dL — ABNORMAL LOW (ref 8.9–10.3)
Chloride: 99 mmol/L (ref 98–111)
Chloride: 97 mmol/L — ABNORMAL LOW (ref 98–111)
Creatinine, Ser: 0.97 mg/dL (ref 0.61–1.24)
Creatinine, Ser: 0.89 mg/dL (ref 0.61–1.24)
GFR calc Af Amer: >60 mL/min (ref >60)
GFR calc Af Amer: >60 mL/min (ref >60)
GFR calc non Af Amer: >60 mL/min (ref >60)
GFR calc non Af Amer: >60 mL/min (ref >60)
Glucose, Bld: 141 mg/dL — ABNORMAL HIGH (ref 70–99)
Glucose, Bld: 173 mg/dL — ABNORMAL HIGH (ref 70–99)
Potassium: 4 mmol/L (ref 3.5–5.1)
Potassium: 3.5 mmol/L (ref 3.5–5.1)
Sodium: 133 mmol/L — ABNORMAL LOW (ref 135–145)
Sodium: 132 mmol/L — ABNORMAL LOW (ref 135–145)

## 2018-01-05 LAB — COOXEMETRY PANEL
Carboxyhemoglobin: 1.8 % — ABNORMAL HIGH (ref 0.5–1.5)
Carboxyhemoglobin: 2 % — ABNORMAL HIGH (ref 0.5–1.5)
Methemoglobin: 1.4 % (ref 0.0–1.5)
Methemoglobin: 1.1 % (ref 0.0–1.5)
O2 Saturation: 63.5 %
O2 Saturation: 67.1 %
Total hemoglobin: 9.8 g/dL — ABNORMAL LOW (ref 12.0–16.0)
Total hemoglobin: 9.2 g/dL — ABNORMAL LOW (ref 12.0–16.0)

## 2018-01-05 MED ORDER — AMIODARONE HCL 200 MG PO TABS
400.0000 mg | ORAL_TABLET | Freq: Two times a day (BID) | ORAL | Status: DC
  Administered 2018-01-05 – 2018-01-06 (×4): 400 mg via ORAL
  Filled 2018-01-05 (×4): qty 2

## 2018-01-05 MED ORDER — SODIUM CHLORIDE 0.9% FLUSH
10.0000 mL | Freq: Two times a day (BID) | INTRAVENOUS | Status: DC
  Administered 2018-01-05 – 2018-01-08 (×7): 10 mL
  Administered 2018-01-09: 20 mL

## 2018-01-05 MED ORDER — POTASSIUM CHLORIDE 10 MEQ/50ML IV SOLN
10.0000 meq | INTRAVENOUS | Status: AC
  Administered 2018-01-05 (×3): 10 meq via INTRAVENOUS
  Filled 2018-01-05 (×3): qty 50

## 2018-01-05 MED ORDER — SODIUM CHLORIDE 0.9% FLUSH: 10.0000 mL | INTRAVENOUS | Status: DC | PRN

## 2018-01-05 MED ORDER — NEFAZODONE HCL 50 MG PO TABS
50.0000 mg | ORAL_TABLET | Freq: Two times a day (BID) | ORAL | Status: DC
  Administered 2018-01-05 – 2018-01-10 (×10): 50 mg via ORAL
  Filled 2018-01-05 (×10): qty 1

## 2018-01-05 MED ORDER — ENOXAPARIN SODIUM 40 MG/0.4ML ~~LOC~~ SOLN
40.0000 mg | SUBCUTANEOUS | Status: DC
  Administered 2018-01-05 – 2018-01-09 (×5): 40 mg via SUBCUTANEOUS
  Filled 2018-01-05 (×5): qty 0.4

## 2018-01-05 MED ORDER — CHLORHEXIDINE GLUCONATE CLOTH 2 % EX PADS
6.0000 | MEDICATED_PAD | Freq: Every day | CUTANEOUS | Status: DC
  Administered 2018-01-06 – 2018-01-08 (×2): 6 via TOPICAL

## 2018-01-05 MED ORDER — DOPAMINE-DEXTROSE 3.2-5 MG/ML-% IV SOLN
2.5000 ug/kg/min | INTRAVENOUS | Status: DC
  Administered 2018-01-05 (×2): 2.5 ug/kg/min via INTRAVENOUS
  Filled 2018-01-05 (×2): qty 250

## 2018-01-05 NOTE — Progress Notes (Signed)
Right neck JP drain removed per MD order. Site clean dry and intact.  [address], [place]

## 2018-01-05 NOTE — Telephone Encounter (Signed)
-----   Message from Gabriel Earing, [place] sent at 01/05/2018  8:26 AM EST ----- S/p CABG/CEA 01/03/18.  F/u with Dr. Carlis Abbott in 2-3 weeks.  Thanks

## 2018-01-05 NOTE — Plan of Care (Signed)
  Problem: Education: Goal: Knowledge of General Education information will improve Description Including pain rating scale, medication(s)/side effects and non-pharmacologic comfort measures Outcome: Progressing   Problem: Health Behavior/Discharge Planning: Goal: Ability to manage health-related needs will improve Outcome: Progressing   Problem: Clinical Measurements: Goal: Ability to maintain clinical measurements within normal limits will improve Outcome: Progressing Goal: Will remain free from infection Outcome: Progressing Goal: Diagnostic test results will improve Outcome: Progressing Goal: Respiratory complications will improve Outcome: Progressing Goal: Cardiovascular complication will be avoided Outcome: Progressing   Problem: Activity: Goal: Risk for activity intolerance will decrease Outcome: Progressing   Problem: Nutrition: Goal: Adequate nutrition will be maintained Outcome: Progressing   Problem: Elimination: Goal: Will not experience complications related to urinary retention Outcome: Progressing   Problem: Pain Managment: Goal: General experience of comfort will improve Outcome: Progressing   Problem: Skin Integrity: Goal: Risk for impaired skin integrity will decrease Outcome: Progressing   Problem: Education: Goal: Knowledge of disease or condition will improve Outcome: Progressing Goal: Knowledge of the prescribed therapeutic regimen will improve Outcome: Progressing   Problem: Clinical Measurements: Goal: Postoperative complications will be avoided or minimized Outcome: Progressing   Problem: Respiratory: Goal: Respiratory status will improve Outcome: Progressing

## 2018-01-05 NOTE — Progress Notes (Signed)
  Progress Note    01/05/2018 7:33 AM 1 Day Post-Op  Subjective:  Extubated.  Sitting up in chair.  No neuro deficits.  Drain output in right neck 2.5 mL.    Vitals:   01/05/18 0645 01/05/18 0700  BP: 103/65 (!) 115/104  Pulse: 95 (!) 102  Resp: 13 13  Temp:    SpO2: 95% 95%    Physical Exam:  General: awake, sitting in chair, NAD Lungs:  [place]  Incisions: Right neck incision c/d/i, some fullness in neck but soft overall and no change from yesterday  Extremities:  Moving all extremities, no neuro deficits including CN II-XII grossly intact   CBC    Component Value Date/Time   WBC 37.3 (H) 01/05/2018 0354   RBC 3.14 (L) 01/05/2018 0354   HGB 9.2 (L) 01/05/2018 0354   HGB 17.5 (H) 12/21/2017 0948   HCT 27.7 (L) 01/05/2018 0354   PLT 220 01/05/2018 0354   PLT 314 12/21/2017 0948   MCV 88.2 01/05/2018 0354   MCH 29.3 01/05/2018 0354   MCHC 33.2 01/05/2018 0354   RDW 28.8 (H) 01/05/2018 0354   LYMPHSABS 1.6 12/21/2017 0948   MONOABS 1.2 (H) 12/21/2017 0948   EOSABS 0.2 12/21/2017 0948   BASOSABS 0.4 (H) 12/21/2017 0948    BMET    Component Value Date/Time   NA 133 (L) 01/05/2018 0354   K 4.0 01/05/2018 0354   CL 99 01/05/2018 0354   CO2 24 01/05/2018 0354   GLUCOSE 141 (H) 01/05/2018 0354   BUN 13 01/05/2018 0354   CREATININE 0.97 01/05/2018 0354   CALCIUM 8.0 (L) 01/05/2018 0354   GFRNONAA >60 01/05/2018 0354   GFRAA >60 01/05/2018 0354    INR    Component Value Date/Time   INR 1.00 01/03/2018 1913     Intake/Output Summary (Last 24 hours) at 01/05/2018 0733 Last data filed at 01/05/2018 0700 Gross per 24 hour  Intake 1872.34 ml  Output 2210.5 ml  Net -338.16 ml     Assessment:  62 y.o. male is s/p:    POD#2 s/p R CEA.  Extubated and in chair this am.  No neuro deficits.  Will remove right neck drain today since minimal output.  Some fullness in right neck but no appreciable change from yesterday and overall soft.  Will continue to monitor.      Marty Heck, MD [facility] Office: [phone] Pager: [phone]

## 2018-01-05 NOTE — Discharge Summary (Addendum)
Physician Discharge Summary       [address]       [place] [address]             [phone]    Patient ID: Justin [name] MRN: [id number] DOB/AGE: 63-Jan-1957 63 y.o.  Admit date: 01/03/2018 Discharge date: 01/10/2018  Admission Diagnoses: 1.Coronary artery disease 2. Atrial fibrillation ([place]) 3. Moderate to severe aortic stenosis 4. Right carotid artery stenosis  Discharge Diagnoses:  1. S/P CABG x 4 2. S/P aortic valve replacement with bioprosthetic valve 3. ABL anemia 4. History of tobacco abuse 5. History of Hyperlipidemia 6. History of Hypertension 7. History of Arthritis 8. History of Acute meniscal tear of knee  Procedure (s):  1.  Coronary artery bypass grafting x4 (left internal mammary artery to left anterior descending, saphenous vein graft to diagonal, saphenous vein graft to OM1, saphenous vein graft to posterior descending). 2.  Aortic valve replacement with a 23 mm Edwards pericardial valve, serial [id number]. 3.  Application of left atrial clip, AtriCure 35 mm. 4.  Endoscopic harvest of right leg greater saphenous vein by Dr. Prescott Gum on 01/03/2018.  History of Presenting Illness: Patient returns for scheduled office visit after initial consultation followed by left and right heart catheterization and TEE. TEE results showed moderate aortic stenosis with mean gradient 33 mmHg peak gradient 55 mmHg, LVEF 45%, and global hypokinesia. Cardiac catheterization results showed severe multivessel CAD with 90% LAD stenosis, 90% RCA stenosis, and 80% circumflex disease. Right heart cath data shows compensated hemodynamics with normal cardiac output and coox 70%. COPD with active smoking, PFTs showed FEV1/FVC ratio 72%, chest x-ray pending Recent onset atrial fibrillation on Eliquis Recent diagnosis polycythemia vera placed on hydroxyurea and 81 mg aspirin Obesity with successful intentional weight loss of 25 pounds on low-carb diet Severe  lower back arthritis  Preop carotid duplex shows R carotid approx 90% stenosis patienr assessed by VVS and endarterectomy recommended combined with AVR CABG Dr. Prescott Gum discussed the need for coronary artery bypass grafting surrgey, aortic valve replacement, probable LA clip, and right carotid endarterectomy. It should be noted patient was seen by Dr. Oneida Alar initially who explained the severity of the right carotid artery stenosis, potential risks, benefits, and complications of the surgery. Dr. Carlis Abbott would perform the vascular surgery, however. Patient underwent a right CEA with bovine patch angioplasty, CABG x 4, AVR, and LA clip on 01/03/2018.  Brief Hospital Course:  The patient was extubated the morning of post operative day one without difficulty. He remained afebrile and hemodynamically stable. He was weaned of Levophed, Neo Synephrine, Milrinone, Dopamine, and Amiodarone drips. Gordy Councilman, a line, and foley were removed early in the post operative course. Chest tubes remained for a few days until output decreased and then were removed. Lopressor was started and titrated accordingly. He was volume over loaded and initially diuresed with a Lasix drip. He was then given Metolazone. He had ABL anemia. He did not require a post op transfusion. Last H and H was stable at 8.8 and 27.3. He has been on Trinsicon and will continue oral Ferrous sulfate for one month at discharge. He has a myeloproliferative disorder and was on Hydroxyurea prior to surgery. His WBC was 53,500 on 12/23 (it was 25,000 on admission).  He was weaned off the insulin drip.   Metformin was restarted at discharge.  The patient's glucose remained well controlled.The patient's HGA1C pre op was  5.9 . The patient was felt surgically stable  for transfer from the ICU to [facility] for further convalescence on 01/06/2018. He continues to progress with cardiac rehab. He was ambulating on room air. He has been tolerating a diet and has had a bowel  movement. He has had bloody drainage from JP wound. As a result, a suture was placed on 12/23. There is no drainage from suture site;only trace bloody like ooze from right CEA wound. He will return to the office next week (on 12/30)to have suture removed and wound evaluated. Epicardial pacing wires were removed on 01/09/2018. Chest tube sutures will be removed the day of discharge. The patient is felt surgically stable for discharge today.   Latest Vital Signs: Blood pressure (!) 89/59, pulse 82, temperature 98.2 F (36.8 C), temperature source Tympanic, resp. rate 16, height 5\' 8"  (1.727 m), weight 90.9 kg, SpO2 99 %.  Physical Exam: Cardiovascular: IRRR IRRR Pulmonary: Clear to auscultation bilaterally Abdomen: Soft, non tender, bowel sounds present. Extremities: Trace bilateral lower extremity edema. Wounds: Sternal and right lower leg wounds are clean and dry.  No erythema or signs of infection. JP wound with no drainage (after suture placed) and right CEA wound with scant bloody drainage.  Discharge Condition: Stable and discharged to home.  Recent laboratory studies:  Lab Results  Component Value Date   WBC 53.5 (HH) 01/10/2018   HGB 8.8 (L) 01/10/2018   HCT 27.3 (L) 01/10/2018   MCV 90.4 01/10/2018   PLT 340 01/10/2018   Lab Results  Component Value Date   NA 134 (L) 01/10/2018   K 3.1 (L) 01/10/2018   CL 89 (L) 01/10/2018   CO2 31 01/10/2018   CREATININE 0.77 01/10/2018   GLUCOSE 71 01/10/2018      Diagnostic Studies: Dg Chest 2 View  Result Date: 01/08/2018 CLINICAL DATA:  Post CABG 01/03/2018. EXAM: CHEST - 2 VIEW COMPARISON:  01/07/2018 FINDINGS: Right-sided PICC line unchanged with tip over the SVC. Lungs are adequately inflated with mild hazy prominence of the perihilar vessels suggesting mild vascular congestion. Minimal patchy density in the left infrahilar/retrocardiac region suggesting atelectasis although infection is possible. Small amount of layering  posterior bilateral pleural fluid. Mild stable cardiomegaly. Remainder of the exam is unchanged. IMPRESSION: Small posterior layering bilateral pleural effusions likely with associated basilar atelectasis. Subtle patchy density in the left base/retrocardiac region likely atelectasis, although infection is possible. Mild stable cardiomegaly with suggestion of minimal vascular congestion. Right-sided PICC line unchanged. Electronically Signed   By: Marin Olp M.D.   On: 01/08/2018 09:10   Dg Chest 2 View  Result Date: 12/30/2017 CLINICAL DATA:  CABG history of hypertension EXAM: CHEST - 2 VIEW COMPARISON:  10/19/2017, 09/27/2013 FINDINGS: Streaky atelectasis at the left base. Mild bronchitic changes. No pleural effusion. Normal heart size with aortic atherosclerosis. No pneumothorax. IMPRESSION: No active cardiopulmonary disease. Mild bronchitic changes and streaky atelectasis at the left base. Electronically Signed   By: Donavan Foil M.D.   On: 12/30/2017 15:42   Dg Chest Port 1 View  Result Date: 01/07/2018 CLINICAL DATA:  Score chest.  CABG EXAM: PORTABLE CHEST 1 VIEW COMPARISON:  01/06/2017 FINDINGS: Prior median sternotomy, CABG and valve replacement. Cardiomegaly. Left chest tube and right PICC line remain in place, unchanged. Bibasilar atelectasis. Small bilateral effusions. No pneumothorax. IMPRESSION: Postoperative changes. Left chest tube remains in place without pneumothorax. Bibasilar atelectasis and small effusions. Electronically Signed   By: Rolm Baptise M.D.   On: 01/07/2018 07:15   Dg Chest Port 1 View  Result Date:  01/06/2018 CLINICAL DATA:  Shortness of breath, pleural effusion EXAM: PORTABLE CHEST 1 VIEW COMPARISON:  01/05/2018 FINDINGS: Right PICC line and left chest tube remain in place, unchanged. No visible pneumothorax. Prior CABG. Cardiomegaly with vascular congestion. Diffuse interstitial and alveolar opacities have increased slightly since prior study, likely worsening  edema. Small bilateral effusions. IMPRESSION: Slight interval worsening in pulmonary edema pattern. Small bilateral effusions. Electronically Signed   By: Rolm Baptise M.D.   On: 01/06/2018 07:23   Dg Chest Port 1 View  Result Date: 01/05/2018 CLINICAL DATA:  Peripherally inserted catheter EXAM: PORTABLE CHEST 1 VIEW COMPARISON:  Earlier today FINDINGS: Right upper extremity PICC with tip at the upper SVC. Left IJ sheath in stable position. Chest tubes in stable position. There is artifact from EKG leads. Unchanged low lung volumes with mild elevation of the left diaphragm. There is atelectatic opacity, vascular congestion, and trace effusions. IMPRESSION: 1. New PICC with tip at the upper SVC. 2. Improved pulmonary edema. Electronically Signed   By: Monte Fantasia M.D.   On: 01/05/2018 10:33   Dg Chest Port 1 View  Result Date: 01/05/2018 CLINICAL DATA:  Chest tube, post AVR EXAM: PORTABLE CHEST 1 VIEW COMPARISON:  01/04/2018 FINDINGS: Interval removal of Swan-Ganz catheter, endotracheal tube and NG tube. Left chest tube remains in place. No visible pneumothorax. Cardiomegaly with vascular congestion and diffuse interstitial opacities, slightly increased, likely edema. Bibasilar atelectasis and small layering effusions. IMPRESSION: Interval extubation. No pneumothorax. Increasing interstitial opacities, likely edema. Increasing bibasilar atelectasis. Small layering effusions. Electronically Signed   By: Rolm Baptise M.D.   On: 01/05/2018 07:22   Dg Chest Port 1 View  Result Date: 01/04/2018 CLINICAL DATA:  ETT evaluation. EXAM: PORTABLE CHEST 1 VIEW COMPARISON:  December 24, 2016 FINDINGS: The ETT terminates in good position. The PA catheter is unchanged in good position. The enteric tube terminates below today's film. Persistent cardiomegaly. The hila and mediastinum are unchanged. The right lung is clear. Mild opacity remains in the left base. There is a left-sided chest tube without pneumothorax.  IMPRESSION: 1. Support apparatus as above. 2. Opacity remains in the left base, probably atelectasis. Recommend attention on follow-up. 3. No other change. Electronically Signed   By: Dorise Bullion III M.D   On: 01/04/2018 08:34   Dg Chest Portable 1 View  Result Date: 01/03/2018 CLINICAL DATA:  CABG. EXAM: PORTABLE CHEST 1 VIEW COMPARISON:  Chest x-ray dated December 30, 2017. FINDINGS: Endotracheal tube in position with the tip 4.2 cm above the level of the carina. Right internal jugular Swan-Ganz catheter with the tip in the main pulmonary outflow tract. Mediastinal and left chest tubes are in good position. Interval CABG, AVR, and left atrial appendage clipping. Small left pleural effusion with left lower lobe consolidation/atelectasis. Patchy increased density in the left mid lung may reflect asymmetric pulmonary edema. The right lung is clear. No pneumothorax. No acute osseous abnormality. IMPRESSION: 1. Lines and tubes as above.  No pneumothorax. 2. Small left pleural effusion and asymmetric left-sided pulmonary edema. Left lower lobe consolidation/atelectasis. Electronically Signed   By: Titus Dubin M.D.   On: 01/03/2018 20:22   Dg Chest Port 1 View  Result Date: 01/03/2018 CLINICAL DATA:  Status post aortic valve replacement EXAM: PORTABLE CHEST 1 VIEW COMPARISON:  Chest radiograph 01/03/2018 at 18:18 FINDINGS: Support Apparatus: --Endotracheal tube: Tip at the level of the clavicular heads. --Enteric tube:Sideport projects over the distal esophagus. Recommend advancing by 7 cm. --Catheter(s):Left IJ approach pulmonary arterial catheter  with tip projecting over the main pulmonary artery. --Other: Left chest tube tip projects over the left mid lung. Paramedian mediastinal drain. Moderate cardiomegaly with mild airspace opacity in the left lung. Right lung is clear. IMPRESSION: 1. Recommend advancing the enteric tube by 7 cm to ensure side port below the gastroesophageal junction. 2.  Satisfactory appearance of the remainder of the support apparatus. 3. Mild left lung atelectasis. Electronically Signed   By: Ulyses Jarred M.D.   On: 01/03/2018 19:50   Vas [place] Burnard Bunting With/wo Tbi  Result Date: 12/14/2017 LOWER EXTREMITY DOPPLER STUDY Indications: In 12/2003, an arterial Doppler showed an ABI of 1.0 at rest and              after excercise, decrease to .60 on the right and .82 at rest and              after excercise, decrease to .28 on the left. Patient presents              today with complaints of bilateral leg cramping at night and left              leg claudication symptoms after walking about 50 yards. High Risk Factors: Hypertension, current smoker.   Performing Technologist: Sharlett Iles RVT  Examination Guidelines: A complete evaluation includes at minimum, Doppler waveform signals and systolic blood pressure reading at the level of bilateral brachial, anterior tibial, and posterior tibial arteries, when vessel segments are accessible. Bilateral testing is considered an integral part of a complete examination. Photoelectric Plethysmograph (PPG) waveforms and toe systolic pressure readings are included as required and additional duplex testing as needed. Limited examinations for reoccurring indications may be performed as noted.  ABI Findings: +---------+------------------+-----+----------+--------+ Right    Rt Pressure (mmHg)IndexWaveform  Comment  +---------+------------------+-----+----------+--------+ Brachial 148                                       +---------+------------------+-----+----------+--------+ ATA      68                0.46 monophasic         +---------+------------------+-----+----------+--------+ PTA      79                0.53 monophasic         +---------+------------------+-----+----------+--------+ PERO     0                 0.00 absent             +---------+------------------+-----+----------+--------+ Great Toe40                 0.27 Abnormal           +---------+------------------+-----+----------+--------+ +---------+------------------+-----+----------+-------+ Left     Lt Pressure (mmHg)IndexWaveform  Comment +---------+------------------+-----+----------+-------+ Brachial 123                                      +---------+------------------+-----+----------+-------+ ATA      51                0.34 monophasic        +---------+------------------+-----+----------+-------+ PTA      64                0.43 monophasic        +---------+------------------+-----+----------+-------+  PERO     0                 0.00 absent            +---------+------------------+-----+----------+-------+ Great Toe27                0.18 Abnormal          +---------+------------------+-----+----------+-------+ +-------+-----------+-----------+------------+------------+ ABI/TBIToday's ABIToday's TBIPrevious ABIPrevious TBI +-------+-----------+-----------+------------+------------+ Right  .53        .27                                 +-------+-----------+-----------+------------+------------+ Left   .43        .18                                 +-------+-----------+-----------+------------+------------+ Right ABIs appear decreased compared to prior study on 01/04/2004. Left ABIs appear decreased compared to prior study on 01/04/2004.  Summary: Right: Resting right ankle-brachial index indicates moderate right lower extremity arterial disease. The right toe-brachial index is abnormal. Left: Resting left ankle-brachial index indicates severe left lower extremity arterial disease. The left toe-brachial index is abnormal.  *See table(s) above for measurements and observations.  Vascular consult recommended. Electronically signed by Quay Burow MD on 12/14/2017 at 12:48:42 PM.    Final    Vas US Doppler Pre Cabg  Result Date: 12/30/2017 PREOPERATIVE VASCULAR EVALUATION  Indications:  Pre CABG. Risk  Factors: Hypertension, hyperlipidemia. Performing Technologist: Landry Mellow RDMS, RVT  Examination Guidelines: A complete evaluation includes B-mode imaging, spectral Doppler, color Doppler, and power Doppler as needed of all accessible portions of each vessel. Bilateral testing is considered an integral part of a complete examination. Limited examinations for reoccurring indications may be performed as noted.  Right Carotid Findings: +----------+--------+--------+--------+-------------------------+--------------+           PSV cm/sEDV cm/sStenosisDescribe                 Comments       +----------+--------+--------+--------+-------------------------+--------------+ CCA Prox  67      5               heterogenous                            +----------+--------+--------+--------+-------------------------+--------------+ CCA Distal21      8               heterogenous                            +----------+--------+--------+--------+-------------------------+--------------+ ICA Prox  502     132     80-99%  heterogenous, calcific                                                    and irregular                           +----------+--------+--------+--------+-------------------------+--------------+ ICA Mid   164     32      40-59%                                          +----------+--------+--------+--------+-------------------------+--------------+  ICA Distal40      10                                       dampened flow  +----------+--------+--------+--------+-------------------------+--------------+ ECA                                                        Not visualized +----------+--------+--------+--------+-------------------------+--------------+ Portions of this table do not appear on this page. +----------+--------+-------+--------+------------+           PSV cm/sEDV cmsDescribeArm Pressure +----------+--------+-------+--------+------------+  Subclavian329            Stenotic152          +----------+--------+-------+--------+------------+ +---------+--------+--+--------+--+---------------------------+ VertebralPSV cm/s77EDV cm/s15Antegrade and dampened flow +---------+--------+--+--------+--+---------------------------+ Dampened flow throughout Left Carotid Findings: +----------+--------+--------+--------+---------------------+------------------+           PSV cm/sEDV cm/sStenosisDescribe             Comments           +----------+--------+--------+--------+---------------------+------------------+ CCA Prox  31      9               heterogenous         severely dampened                                                         flow               +----------+--------+--------+--------+---------------------+------------------+ CCA Distal12      5               heterogenous         severely dampened                                                         flow               +----------+--------+--------+--------+---------------------+------------------+ ICA Prox                  Occludedcalcific, irregular                                                       and heterogenous                        +----------+--------+--------+--------+---------------------+------------------+ ICA Mid                   Occluded                                        +----------+--------+--------+--------+---------------------+------------------+ ECA       74      21  dampened flow      +----------+--------+--------+--------+---------------------+------------------+ +----------+--------+--------+--------+------------+ SubclavianPSV cm/sEDV cm/sDescribeArm Pressure +----------+--------+--------+--------+------------+           514             Stenotic117          +----------+--------+--------+--------+------------+  +---------+--------+---+--------+--+---------------------------+ VertebralPSV cm/s111EDV cm/s22Antegrade and dampened flow +---------+--------+---+--------+--+---------------------------+ Severely dampened flow throughout  ABI Findings: +--------+------------------+-----+---------+--------+ Right   Rt Pressure (mmHg)IndexWaveform Comment  +--------+------------------+-----+---------+--------+ [id number]                    triphasic         +--------+------------------+-----+---------+--------+ +--------+----------------+-----+--------+------------------------------------+ Left    Lt Pressure     IndexWaveformComment                                      (mmHg)                                                            +--------+----------------+-----+--------+------------------------------------+ [id number]                  biphasicsignificantly lower BP compared to                                        right                                +--------+----------------+-----+--------+------------------------------------+ ABI performed 12/14/17 Right 0.53 Left 0.43 = severe arterial disease. Right Doppler Findings: +--------+--------+-----+---------+--------+ Site    PressureIndexDoppler  Comments +--------+--------+-----+---------+--------+ [id number]          triphasic         +--------+--------+-----+---------+--------+ Radial               biphasic          +--------+--------+-----+---------+--------+ Ulnar                biphasic          +--------+--------+-----+---------+--------+  Left Doppler Findings: +--------+--------+-----+----------+----------------------------------------+ Site    PressureIndexDoppler   Comments                                 +--------+--------+-----+----------+----------------------------------------+ [id number]          biphasic  significantly lower BP compared to right  +--------+--------+-----+----------+----------------------------------------+ Radial               monophasic                                         +--------+--------+-----+----------+----------------------------------------+ Ulnar                monophasic                                         +--------+--------+-----+----------+----------------------------------------+  Summary: Right Carotid: Velocities in the right ICA are consistent with a 80-99%  stenosis. Left Carotid: Evidence consistent with a total occlusion of the left ICA. Vertebrals: Bilateral vertebral arteries demonstrate antegrade flow. Dampened             flow. ABI: ABI performed 12/14/17 Right 0.53 Left 0.43 = severe arterial disease. Right Upper Extremity: Doppler waveforms decrease >50% with right radial compression. Doppler waveforms remain within normal limits with right ulnar compression. Left Upper Extremity: Doppler waveforms remain within normal limits with left radial compression. Doppler waveforms remain within normal limits with left ulnar compression.  Electronically signed by Monica Martinez MD on 12/30/2017 at 2:59:20 PM.    Final    Vas [place] Lower Extremity Arterial Duplex  Result Date: 12/14/2017 LOWER EXTREMITY ARTERIAL DUPLEX STUDY Indications: In 12/2003, an arterial Doppler showed an ABI of 1.0 at rest and              after exercise, decrease to .60 on the right and .82 at rest and              after exercise, decrease to .28 on the left. Patient presents today              with complaints of bilateral leg cramping at night and left leg              claudication symptoms after walking about 50 yards. High Risk Factors: Hypertension, current smoker.   Current ABI: .53 on the right and .43 on the left. Performing Technologist: [place]  Examination Guidelines: A complete evaluation includes B-mode imaging, spectral Doppler, color Doppler, and power Doppler as needed of all accessible portions of  each vessel. Bilateral testing is considered an integral part of a complete examination. Limited examinations for reoccurring indications may be performed as noted.  Right Duplex Findings: +-----------+--------+-----+-------------+----------------+--------------------+            PSV cm/sRatioStenosis     Waveform        Comments             +-----------+--------+-----+-------------+----------------+--------------------+ CIA Prox   167                       biphasic                             +-----------+--------+-----+-------------+----------------+--------------------+ CIA Mid    231          >50% stenosisbiphasic                             +-----------+--------+-----+-------------+----------------+--------------------+ CIA Distal 243          >50% stenosisbiphasic                             +-----------+--------+-----+-------------+----------------+--------------------+ EIA Prox   90                        monophasic                           +-----------+--------+-----+-------------+----------------+--------------------+ EIA Mid    45                        dampened  monophasic                           +-----------+--------+-----+-------------+----------------+--------------------+ EIA Distal 30                        dampened                                                                  monophasic                           +-----------+--------+-----+-------------+----------------+--------------------+ CFA Prox   0            occluded                     bulky calcified                                                           plaque               +-----------+--------+-----+-------------+----------------+--------------------+ CFA Mid    0                                         bulky calcified                                                            plaque               +-----------+--------+-----+-------------+----------------+--------------------+ CFA Distal 0                                         bulky calcified                                                           plaque               +-----------+--------+-----+-------------+----------------+--------------------+ DFA        64                        monophasic      heterogenous plaque  +-----------+--------+-----+-------------+----------------+--------------------+ SFA Prox   58                        monophasic      heterogenous plaque  +-----------+--------+-----+-------------+----------------+--------------------+ SFA Mid    44  monophasic      bulky calcified                                                           plaque               +-----------+--------+-----+-------------+----------------+--------------------+ SFA Distal 30                                        medial                                                                    calcifications       +-----------+--------+-----+-------------+----------------+--------------------+ POP Prox   20                                        heterogenous plaque  +-----------+--------+-----+-------------+----------------+--------------------+ POP Mid    19                                        mild heterogenous                                                         plaque               +-----------+--------+-----+-------------+----------------+--------------------+ POP Distal 20                                        mild heterogenous                                                         plaque               +-----------+--------+-----+-------------+----------------+--------------------+ ATA Prox   16                        monophasic                            +-----------+--------+-----+-------------+----------------+--------------------+ ATA Mid    13                        monophasic                           +-----------+--------+-----+-------------+----------------+--------------------+ ATA Distal 11  monophasic                           +-----------+--------+-----+-------------+----------------+--------------------+ PTA Prox   28                        monophasic                           +-----------+--------+-----+-------------+----------------+--------------------+ PTA Mid    14                        monophasic                           +-----------+--------+-----+-------------+----------------+--------------------+ PTA Distal 12                        monophasic                           +-----------+--------+-----+-------------+----------------+--------------------+ PERO Prox  20                        monophasic                           +-----------+--------+-----+-------------+----------------+--------------------+ PERO Mid   11                        monophasic                           +-----------+--------+-----+-------------+----------------+--------------------+ PERO Distal0            occluded                                          +-----------+--------+-----+-------------+----------------+--------------------+ Heterogenous plaque noted throughout the common and external iliac arteries, with elevated velocities in the mid and distal common iliac artery. The CFA is occluded with reconstitution of flow at the SFA ostium. No focal stenosis noted throughout the lower extremity. Two vessel run-off via PTA and ATA. Far distal occlusion noted in the peroneal artery near medial malleolus.  Left Duplex Findings: +-----------+--------+-----+-------------+----------+----------------------+            PSV cm/sRatioStenosis     Waveform  Comments                +-----------+--------+-----+-------------+----------+----------------------+ CIA Prox   116                       monophasic                       +-----------+--------+-----+-------------+----------+----------------------+ CIA Mid    228          >50% stenosismonophasic                       +-----------+--------+-----+-------------+----------+----------------------+ CIA Distal 203          >50% stenosismonophasic                       +-----------+--------+-----+-------------+----------+----------------------+ EIA Prox   129  monophasic                       +-----------+--------+-----+-------------+----------+----------------------+ EIA Mid    100                       monophasic                       +-----------+--------+-----+-------------+----------+----------------------+ EIA Distal 64                        monophasic                       +-----------+--------+-----+-------------+----------+----------------------+ CFA Prox   0            occluded               bulky calcified plaque +-----------+--------+-----+-------------+----------+----------------------+ CFA Mid    0                                   bulky calcified plaque +-----------+--------+-----+-------------+----------+----------------------+ CFA Distal 0                                   bulky calcified plaque +-----------+--------+-----+-------------+----------+----------------------+ DFA        49                        monophasicheterogenous plaque    +-----------+--------+-----+-------------+----------+----------------------+ SFA Prox   0            occluded               bulky calcified plaque +-----------+--------+-----+-------------+----------+----------------------+ SFA Mid    0            occluded               bulky calcified plaque +-----------+--------+-----+-------------+----------+----------------------+ SFA Distal 12                         monophasicheterogenous plaque    +-----------+--------+-----+-------------+----------+----------------------+ POP Prox   21                        monophasicheterogenous plaque    +-----------+--------+-----+-------------+----------+----------------------+ POP Mid    16                        monophasicheterogenous plaque    +-----------+--------+-----+-------------+----------+----------------------+ POP Distal 0            occluded               heterogenous plaque    +-----------+--------+-----+-------------+----------+----------------------+ ATA Prox   16                        monophasic                       +-----------+--------+-----+-------------+----------+----------------------+ ATA Mid    12                        monophasic                       +-----------+--------+-----+-------------+----------+----------------------+ ATA Distal 12  monophasic                       +-----------+--------+-----+-------------+----------+----------------------+ PTA Prox   7                         monophasic                       +-----------+--------+-----+-------------+----------+----------------------+ PTA Mid    6                         monophasic                       +-----------+--------+-----+-------------+----------+----------------------+ PTA Distal 5                         monophasic                       +-----------+--------+-----+-------------+----------+----------------------+ PERO Prox  7                         monophasic                       +-----------+--------+-----+-------------+----------+----------------------+ PERO Mid   5                         monophasic                       +-----------+--------+-----+-------------+----------+----------------------+ PERO Distal0            occluded                                       +-----------+--------+-----+-------------+----------+----------------------+ Heterogenous plaque noted throughout the common and external iliac arteries, with elevated velocities in the mid and distal common iliac artery. The CFA is occluded. Interval occlusions noted throughout the proximal SFA. The mid SFA is occluded with reconstitution of flow in the distal segment. Short segment occlusion noted in the below-the-knee popliteal artery. Two vessel run-off via PTA and ATA. Far distal occlusion noted in the peroneal artery near medial malleolus.  Aorta: +--------+-------+----------+----------+--------+--------+-----+         AP (cm)Trans (cm)PSV (cm/s)WaveformThrombusShape +--------+-------+----------+----------+--------+--------+-----+ Proximal       2.10      108                             +--------+-------+----------+----------+--------+--------+-----+ Mid     2.00   2.10      88                              +--------+-------+----------+----------+--------+--------+-----+ Distal  2.00   1.90      66                              +--------+-------+----------+----------+--------+--------+-----+ Aortic atherosclerosis without focal stenosis.  Summary: Right: >50% stenosis noted in the mid and distal common iliac segment. Total occlusion noted in the CFA. Two vessel run-off via PTA and ATA. Far distal occlusion noted in the peroneal artery.. Left: >50%  stenosis noted in the mid and distal common iliac segment. Total occlusion noted in the CFA. Interval occlusions noted in the proximal SFA. Total occlusion noted in the mid SFA. Short segment occlusion noted in the below-the-knee popliteal artery. Two vessel run-off via PTA and ATA. Far distal occlusion noted in the peroneal artery.  See table(s) above for measurements and observations. Vascular consult recommended. Electronically signed by Quay Burow MD on 12/14/2017 at 12:47:53 PM.    Final    [place] Ekg Site Rite  Result Date:  01/05/2018 If Site Rite image not attached, placement could not be confirmed due to current cardiac rhythm.      Discharge Instructions    Amb Referral to Cardiac Rehabilitation   Complete by:  As directed    Diagnosis:   CABG Valve Replacement     Valve:  Mitral   CABG X ___:  4      Discharge Medications: Allergies as of 01/10/2018      Reactions   Codeine Swelling   SWELLING REACTION UNSPECIFIED  Tolerates hydrocodone and oxycodone       Medication List    STOP taking these medications   atenolol 50 MG tablet Commonly known as:  TENORMIN   hydrochlorothiazide 12.5 MG capsule Commonly known as:  MICROZIDE   HYDROcodone-acetaminophen 5-325 MG tablet Commonly known as:  NORCO/VICODIN   meloxicam 15 MG tablet Commonly known as:  MOBIC   multivitamin tablet   Omega 3 1200 MG Caps   simvastatin 40 MG tablet Commonly known as:  ZOCOR     TAKE these medications   apixaban 5 MG Tabs tablet Commonly known as:  ELIQUIS Take 1 tablet (5 mg total) by mouth 2 (two) times daily. Start taking on:  January 11, 2018   aspirin 81 MG tablet Take 81 mg by mouth daily.   atorvastatin 20 MG tablet Commonly known as:  LIPITOR Take 1 tablet (20 mg total) by mouth daily at 6 PM.   Cholecalciferol 125 MCG (5000 UT) capsule Take 1 capsule by mouth daily.   cyclobenzaprine 10 MG tablet Commonly known as:  FLEXERIL Take 10 mg by mouth 3 (three) times daily.   ferrous sulfate 325 (65 FE) MG tablet Take 1 tablet (325 mg total) by mouth daily with breakfast. For one month then stop. If develops constipation, may stop sooner   finasteride 5 MG tablet Commonly known as:  PROSCAR Take 1 tablet (5 mg total) by mouth daily.   fluticasone 50 MCG/ACT nasal spray Commonly known as:  FLONASE Place 2 sprays into both nostrils daily.   furosemide 40 MG tablet Commonly known as:  LASIX Take 1 tablet (40 mg total) by mouth daily. For 5 days then stop   hydroxyurea 500 MG  capsule Commonly known as:  HYDREA Take 1 capsule (500 mg total) by mouth 2 (two) times daily. May take with food to minimize GI side effects. Start taking on:  January 17, 2018 What changed:  These instructions start on January 17, 2018. If you are unsure what to do until then, ask your doctor or other care provider.   loratadine 10 MG tablet Commonly known as:  CLARITIN Take 10 mg by mouth daily.   metFORMIN 500 MG 24 hr tablet Commonly known as:  GLUCOPHAGE-XR Take 500 mg by mouth daily before supper.   metoprolol tartrate 25 MG tablet Commonly known as:  LOPRESSOR Take 0.5 tablets (12.5 mg total) by mouth 2 (two) times daily.   nefazodone 100 MG  tablet Commonly known as:  SERZONE Take 100 mg by mouth 2 (two) times daily.   potassium chloride SA 20 MEQ tablet Commonly known as:  K-DUR,KLOR-CON Take 1 tablet (20 mEq total) by mouth daily. For 5 days then stop.   pregabalin 50 MG capsule Commonly known as:  LYRICA Take 50 mg by mouth 3 (three) times daily.   traMADol 50 MG tablet Commonly known as:  ULTRAM Take 50 mg by mouth every 4-6 hours PRN severe pain   zolpidem 10 MG tablet Commonly known as:  AMBIEN Take 10 mg by mouth at bedtime as needed.      The patient has been discharged on:   1.Beta Blocker:  Yes [  x ]                              No   [   ]                              If No, reason:  2.Ace Inhibitor/ARB: Yes [   ]                                     No  [ x   ]                                     If No, reason:Labile BP  3.Statin:   Yes [ x  ]                  No  [   ]                  If No, reason:  4.Ecasa:  Yes  [x   ]                  No   [   ]                  If No, reason:  Follow Up Appointments: Follow-up Information    Marty Heck, MD In 3 weeks.   Specialty:  Vascular Surgery Why:  Office will call you to arrange your appt (sent) Contact information: [place] [address] [phone]          Ivin Poot, MD. Go on 02/16/2018.   Specialty:  Cardiothoracic Surgery Why:  PA/LAT CXR to be taken (at [facility] which is in the same buidling as Dr. Lucianne Lei Trigt's office) on 02/16/2018 at 9:30 am;Appointment time is at 10:00 am Contact information: [place] [address] [phone]        Barrett, Evelene Croon, PA-C. Go on 01/21/2018.   Specialties:  Cardiology, Radiology Why:  Appointment is at 10:00 am  Contact information: [address] [place] [address] [phone]        Nurse. Go on 01/17/2018.   Why:  Appointment is with nurse only to have right neck suture removed. Appoitnment time is at 10:30 am   Contact information: [place] [address]          Signed: Sharalyn In[facility] 01/10/2018, 11:34 AM  patient examined and  medical record reviewed,agree with above note. Tharon Aquas Trigt III 01/11/2018

## 2018-01-05 NOTE — Evaluation (Signed)
Physical Therapy Evaluation Patient Details Name: Justin [name] MRN: [id number] DOB: 05-Aug-1955 Today's Date: 01/05/2018   History of Present Illness  Pt adm forCABG x 4 and AVR and simultaneous rt carotid endarterectomy on 12/16. PMH - CAD, afib, PAD, arthritis, HTN, cervical fusion  Clinical Impression  Pt admitted with above diagnosis and presents to PT with functional limitations due to deficits listed below (See PT problem list). Pt needs skilled PT to maximize independence and safety to allow discharge to home with family. Expect he will progress quickly and doubt he will need PT after DC.      Follow Up Recommendations No PT follow up(if he progresses as expected)    Equipment Recommendations  Other (comment)(To be determined)    Recommendations for Other Services       Precautions / Restrictions Precautions Precautions: Sternal      Mobility  Bed Mobility Overal bed mobility: Needs Assistance Bed Mobility: Supine to Sit;Sit to Supine     Supine to sit: Min assist Sit to supine: Mod assist   General bed mobility comments: assist to elevate trunk into sitting. Assist to lower trunk and bring legs up into the bed when returning to supine  Transfers Overall transfer level: Needs assistance Equipment used: Ambulation equipment used(EVA walker) Transfers: Sit to/from Stand Sit to Stand: Min assist         General transfer comment: Assist to bring hips up and for lines  Ambulation/Gait Ambulation/Gait assistance: Min guard Gait Distance (Feet): 180 Feet Assistive device: 4-wheeled walker(EVA walker) Gait Pattern/deviations: Step-through pattern;Decreased stride length;Trunk flexed Gait velocity: decr Gait velocity interpretation: 1.31 - 2.62 ft/sec, indicative of limited community ambulator General Gait Details: Assist for safety and lines. Verbal cues to stand more erect. Amb on 6L of O2 with SpO2 89-90%  Stairs            Wheelchair Mobility     Modified Rankin (Stroke Patients Only)       Balance Overall balance assessment: No apparent balance deficits (not formally assessed)                                           Pertinent Vitals/Pain Pain Assessment: Faces Faces Pain Scale: Hurts a little bit Pain Location: surgical site Pain Descriptors / Indicators: Grimacing Pain Intervention(s): Limited activity within patient's tolerance;Repositioned    Home Living Family/patient expects to be discharged to:: Private residence Living Arrangements: Spouse/significant other Available Help at Discharge: Family Type of Home: House Home Access: Stairs to enter   Technical brewer of Steps: 1 Home Layout: One level Home Equipment: None      Prior Function Level of Independence: Independent         Comments: Works as Therapist, art        Extremity/Trunk Assessment   Upper Extremity Assessment Upper Extremity Assessment: Overall WFL for tasks assessed    Lower Extremity Assessment Lower Extremity Assessment: Generalized weakness       Communication   Communication: No difficulties  Cognition Arousal/Alertness: Awake/alert Behavior During Therapy: WFL for tasks assessed/performed Overall Cognitive Status: Within Functional Limits for tasks assessed                                        General Comments  Exercises     Assessment/Plan    PT Assessment Patient needs continued PT services  PT Problem List Decreased strength;Decreased mobility;Decreased knowledge of precautions;Decreased activity tolerance       PT Treatment Interventions DME instruction;Gait training;Functional mobility training;Therapeutic activities;Therapeutic exercise;Patient/family education    PT Goals (Current goals can be found in the Care Plan section)  Acute Rehab PT Goals Patient Stated Goal: return home PT Goal Formulation: With patient Time For Goal  Achievement: 01/12/18 Potential to Achieve Goals: Good    Frequency Min 3X/week   Barriers to discharge        Co-evaluation               AM-PAC PT "6 Clicks" Mobility  Outcome Measure Help needed turning from your back to your side while in a flat bed without using bedrails?: A Little Help needed moving from lying on your back to sitting on the side of a flat bed without using bedrails?: A Little Help needed moving to and from a bed to a chair (including a wheelchair)?: A Little Help needed standing up from a chair using your arms (e.g., wheelchair or bedside chair)?: A Little Help needed to walk in hospital room?: A Little Help needed climbing 3-5 steps with a railing? : A Lot 6 Click Score: 17    End of Session Equipment Utilized During Treatment: Oxygen Activity Tolerance: Patient tolerated treatment well Patient left: in bed;with call bell/phone within reach;with family/visitor present Nurse Communication: Mobility status PT Visit Diagnosis: Other abnormalities of gait and mobility (R26.89)    Time: 6606-3016 PT Time Calculation (min) (ACUTE ONLY): 22 min   Charges:   PT Evaluation $PT Eval Moderate Complexity: 1 Mod          Jordain Radin Beulah Pager [phone] Office [place] 01/05/2018, 12:42 PM

## 2018-01-05 NOTE — Discharge Instructions (Signed)
Discharge Instructions:  1. You may shower, please wash incisions daily with soap and water and keep dry.  If you wish to cover wounds with dressing you may do so but please keep clean and change daily.  No tub baths or swimming until incisions have completely healed.  If your incisions become red or develop any drainage please call our office at [phone]  2. No Driving until cleared by Dr. Lucianne Lei Trigt's office and you are no longer using narcotic pain medications  3. Monitor your weight daily.. Please use the same scale and weigh at same time... If you gain 5-10 lbs in 48 hours with associated lower extremity swelling, please contact our office at [phone]  4. Fever of 101.5 for at least 24 hours with no source, please contact our office at [phone]  5. Activity- up as tolerated, please walk at least 3 times per day.  Avoid strenuous activity, no lifting, pushing, or pulling with your arms over 8-10 lbs for a minimum of 6 weeks  6. If any questions or concerns arise, please do not hesitate to contact our office at [phone]   Coronary Artery Bypass Grafting, Care After This sheet gives you information about how to care for yourself after your procedure. Your doctor may also give you more specific instructions. If you have problems or questions, contact your doctor. Follow these instructions at home: Medicines  Take over-the-counter and prescription medicines only as told by your doctor. Do not stop taking medicines or start any new medicines unless your doctor says it is okay.  If you were prescribed an antibiotic medicine, take it as told by your doctor. Do not stop taking the antibiotic even if you start to feel better.  Do not drive or use heavy machinery while taking prescription pain medicine. Incision care      Follow instructions from your doctor about how to take care of your incisions. Make sure you: ? Wash your hands with soap and water before you change your  bandage (dressing). If you cannot use soap and water, use hand sanitizer. ? Change your dressing as told by your doctor. ? Leave stitches (sutures), skin glue, or skin tape (adhesive) strips in place. They may need to stay in place for 2 weeks or longer. If tape strips get loose and curl up, you may trim the loose edges. Do not remove tape strips completely unless your doctor says it is okay.  Make sure the incisions are clean, dry, and protected.  Check your incision areas every day for signs of infection. Check for: ? Redness, swelling, or pain. ? Fluid or blood. ? Warmth. ? Pus or a bad smell.  If cuts were made in your legs: ? Avoid crossing your legs. ? Avoid sitting for long periods of time. Change positions every 30 minutes. ? Raise (elevate) your legs when you are sitting. Bathing  Do not take baths, swim, or use a hot tub until your doctor says it is okay.  Only take sponge baths. Pat the incisions dry. Do not rub the incisions to dry.  Ask your doctor when you can shower. Eating and drinking  Eat foods that are high in fiber, such as raw fruits and vegetables, whole grains, beans, and nuts. Meats should be lean cut. Avoid canned, processed, and fried foods. This can help prevent constipation. This is also a recommended part of a heart-healthy diet.  Drink enough fluid to keep your urine clear or pale yellow.  Limit alcohol intake to  no more than 1 drink a day for nonpregnant women and 2 drinks a day for men. One drink equals 12 oz of beer, 5 oz of wine, or 1 oz of hard liquor. Activity  Rest and limit your activity as told by your doctor. You may be told to: ? Stop any activity right away if you have chest pain, shortness of breath, irregular heartbeats, or dizziness. Get help right away if you have any of these symptoms. ? Move around often for short periods or take short walks as told by your doctor. Slowly increase your activities. You may need physical therapy or  cardiac rehabilitation. ? Avoid lifting, pushing, or pulling anything that is heavier than 10 lb (4.5 kg) for at least 6 weeks or as told by your doctor.  Do not drive until your doctor says it is okay.  Ask your doctor when you can go back to work.  Ask your doctor when you can be sexually active. General instructions   Do not use any products that contain nicotine or tobacco, such as cigarettes and e-cigarettes. If you need help quitting, ask your doctor.  Take 2-3 deep breaths every few hours during the day while you get better. This helps expand your lungs and prevent complications.  If you were given a device called an incentive spirometer, use it several times a day to practice deep breathing. Support your chest with a pillow or your arms when you take deep breaths or cough.  Wear compression stockings as told by your doctor. These stockings help to prevent blood clots and reduce swelling in your legs.  Weigh yourself every day. This helps to see if your body is holding (retaining) fluid that may make your heart and lungs work harder.  Keep all follow-up visits as told by your doctor. This is important. Contact a doctor if:  You have more redness, swelling, or pain around any cut.  You have more fluid or blood coming from any cut.  Any cut feels warm to the touch.  You have pus or a bad smell coming from any cut.  You have a fever.  You have swelling in your ankles or legs.  You have pain in your legs.  You gain 2 or more pounds (0.9 kg) a day.  You feel sick to your stomach (nauseous) or throw up (vomit).  You have watery poop (diarrhea). Get help right away if:  You have chest pain that goes to your jaw or arms.  You are short of breath.  You have a fast or irregular heartbeat.  You notice a "clicking" in your breastbone (sternum) when you move.  You feel numb or weak in your arms or legs.  You feel dizzy or light-headed. Summary  After the procedure,  it is common to have pain or discomfort in the incision areas.  Do not take baths, swim, or use a hot tub until your health care provider approves.  Slowly increase your activities. You may need physical therapy or cardiac rehabilitation.  Weigh yourself every day. This helps to see if your body is holding (retaining) fluid that may make your heart and lungs work harder. This information is not intended to replace advice given to you by your health care provider. Make sure you discuss any questions you have with your health care provider. Document Released: 01/10/2013 Document Revised: 02/18/2016 Document Reviewed: 02/18/2016 Elsevier Interactive Patient Education  2019 [employer].

## 2018-01-05 NOTE — Progress Notes (Addendum)
Unable to draw blood off introducer and aline. Phlebotomy notified and will draw labs. Will relay to day shift unable to obtain co-ox.

## 2018-01-05 NOTE — Progress Notes (Signed)
Peripherally Inserted Central Catheter/Midline Placement  The IV Nurse has discussed with the patient and/or persons authorized to consent for the patient, the purpose of this procedure and the potential benefits and risks involved with this procedure.  The benefits include less needle sticks, lab draws from the catheter, and the patient may be discharged home with the catheter. Risks include, but not limited to, infection, bleeding, blood clot (thrombus formation), and puncture of an artery; nerve damage and irregular heartbeat and possibility to perform a PICC exchange if needed/ordered by physician.  Alternatives to this procedure were also discussed.  Bard Power PICC patient education guide, fact sheet on infection prevention and patient information card has been provided to patient /or left at bedside.    PICC/Midline Placement Documentation  PICC Double Lumen 01/05/18 PICC Right Brachial 34 cm 0 cm (Active)  Indication for Insertion or Continuance of Line Prolonged intravenous therapies 01/05/2018 10:04 AM  Exposed Catheter (cm) 0 cm 01/05/2018 10:04 AM  Site Assessment Clean;Dry;Intact 01/05/2018 10:04 AM  Lumen #1 Status Flushed;Blood return noted;Saline locked 01/05/2018 10:04 AM  Lumen #2 Status Flushed;Saline locked;Blood return noted 01/05/2018 10:04 AM  Dressing Type Transparent;Securing device 01/05/2018 10:04 AM  Dressing Status Clean;Dry;Antimicrobial disc in place;Intact 01/05/2018 10:04 AM  Dressing Change Due 01/12/18 01/05/2018 10:04 AM       Frances Maywood 01/05/2018, 10:07 AM

## 2018-01-05 NOTE — Progress Notes (Signed)
CTS  Had a good day, PT walked Urine output improved\  add flutter valve for airway secretions swallow assessment for hoarseness after carotid surgery

## 2018-01-05 NOTE — Progress Notes (Addendum)
[facility] DAILY ICU PROGRESS NOTE                   [address]            [place] [address]          [phone]   2 Days Post-Op Procedure(s) (LRB): AORTIC VALVE REPLACEMENT (AVR) using 78mm Magna Ease Bioprosthesis Aortic Valve (N/A) CORONARY ARTERY BYPASS GRAFTING (CABG) x 4 (LIMA to LAD, SVG to DIAGONAL, SVG to RAMUS INTERMEDIATE, and SVG to PDA), USING LEFT INFTERNAL MAMMARY ARTERY AND (N/A) TRANSESOPHAGEAL ECHOCARDIOGRAM (TEE) (N/A) ENDARTERECTOMY CAROTID (Right)  Total Length of Stay:  LOS: 2 days   Subjective: Patient having PICC line placed this am. He has no specific complaints except hoarsenss, raspy voice  Objective: Vital signs in last 24 hours: Temp:  [97.9 F (36.6 C)-98.6 F (37 C)] 98.6 F (37 C) (12/18 0400) Pulse Rate:  [55-118] 107 (12/18 0900) Cardiac Rhythm: Atrial fibrillation (12/18 0800) Resp:  [11-30] 13 (12/18 0900) BP: (82-152)/(48-136) 152/136 (12/18 0900) SpO2:  [89 %-100 %] 89 % (12/18 0900) Arterial Line BP: (43-156)/(40-126) 97/71 (12/18 0530) Weight:  [96.6 kg] 96.6 kg (12/18 0500)  Filed Weights   01/04/18 0500 01/05/18 0500  Weight: 94 kg 96.6 kg    Weight change: 2.616 kg   Hemodynamic parameters for last 24 hours: PAP: (16-18)/(9-11) 18/10 CO:  [4.4 L/min] 4.4 L/min CI:  [2.1 L/min/m2] 2.1 L/min/m2  Intake/Output from previous day: 12/17 0701 - 12/18 0700 In: 1872.3 [P.O.:640; I.V.:1232.3] Out: 2210.5 [Urine:1493; Emesis/NG output:100; Drains:47.5; Chest Tube:570]  Intake/Output this shift: Total I/O In: 34.6 [I.V.:34.6] Out: 30 [Urine:30]  Current Meds: Scheduled Meds: . acetaminophen  1,000 mg Oral Q6H   Or  . acetaminophen (TYLENOL) oral liquid 160 mg/5 mL  1,000 mg Per Tube Q6H  . aspirin EC  325 mg Oral Daily   Or  . aspirin  324 mg Per Tube Daily  . atorvastatin  20 mg Oral q1800  . bisacodyl  10 mg Oral Daily   Or  . bisacodyl  10 mg Rectal Daily  . Chlorhexidine Gluconate Cloth  6 each  Topical Daily  . docusate sodium  200 mg Oral Daily  . finasteride  5 mg Oral Daily  . fluticasone  2 spray Each Nare Daily  . insulin aspart  0-24 Units Subcutaneous Q4H  . insulin glargine  14 Units Subcutaneous BID  . levalbuterol  0.63 mg Nebulization TID  . loratadine  10 mg Oral Daily  . mouth rinse  15 mL Mouth Rinse BID  . metoCLOPramide (REGLAN) injection  10 mg Intravenous Q6H  . metoprolol tartrate  12.5 mg Oral BID   Or  . metoprolol tartrate  12.5 mg Per Tube BID  . mupirocin ointment  1 application Nasal BID  . nefazodone  100 mg Oral BID  . pantoprazole  40 mg Oral Daily  . pregabalin  50 mg Oral TID  . sodium chloride flush  3 mL Intravenous Q12H   Continuous Infusions: . sodium chloride Stopped (01/04/18 1000)  . sodium chloride 250 mL (01/04/18 0617)  . sodium chloride Stopped (01/04/18 1100)  . amiodarone 30 mg/hr (01/05/18 0812)  . DOPamine 2.5 mcg/kg/min (01/05/18 0800)  . furosemide (LASIX) infusion 10 mg/hr (01/05/18 0813)  . lactated ringers    . magnesium sulfate    . milrinone 0.25 mcg/kg/min (01/05/18 0800)  . nitroGLYCERIN     PRN Meds:.sodium chloride, metoprolol tartrate, midazolam, morphine injection, ondansetron (  ZOFRAN) IV, sodium chloride flush, traMADol, zolpidem  General appearance: alert, cooperative and no distress Neurologic: intact Heart: IRRR IRRR, no murmur Lungs: diminished breath sounds bibasilar  Abdomen: Soft, non tender, sporadic bowel sounds present Extremities: Mild bilateral LE edema Wound: Aquacel intact. RLE dressings clean and dry Right neck JP drain with minor bloody ooze  Lab Results: CBC: Recent Labs    01/04/18 1700 01/05/18 0354  WBC 34.9* 37.3*  HGB 9.6* 9.2*  HCT 30.6* 27.7*  PLT 190 220   BMET:  Recent Labs    01/04/18 0453 01/04/18 1656 01/04/18 1700 01/05/18 0354  NA 139 135  --  133*  K 4.3 4.0  --  4.0  CL 108 102  --  99  CO2 22  --   --  24  GLUCOSE 80 253*  --  141*  BUN 8 10  --  13    CREATININE 0.63 0.60* 0.89 0.97  CALCIUM 7.8*  --   --  8.0*    CMET: Lab Results  Component Value Date   WBC 37.3 (H) 01/05/2018   HGB 9.2 (L) 01/05/2018   HCT 27.7 (L) 01/05/2018   PLT 220 01/05/2018   GLUCOSE 141 (H) 01/05/2018   ALT 26 01/05/2018   AST 82 (H) 01/05/2018   NA 133 (L) 01/05/2018   K 4.0 01/05/2018   CL 99 01/05/2018   CREATININE 0.97 01/05/2018   BUN 13 01/05/2018   CO2 24 01/05/2018   INR 1.00 01/03/2018   HGBA1C 5.9 (H) 12/30/2017      PT/INR:  Recent Labs    01/03/18 1913  LABPROT 13.1  INR 1.00   Radiology: Dg Chest Port 1 View  Result Date: 01/05/2018 CLINICAL DATA:  Chest tube, post AVR EXAM: PORTABLE CHEST 1 VIEW COMPARISON:  01/04/2018 FINDINGS: Interval removal of Swan-Ganz catheter, endotracheal tube and NG tube. Left chest tube remains in place. No visible pneumothorax. Cardiomegaly with vascular congestion and diffuse interstitial opacities, slightly increased, likely edema. Bibasilar atelectasis and small layering effusions. IMPRESSION: Interval extubation. No pneumothorax. Increasing interstitial opacities, likely edema. Increasing bibasilar atelectasis. Small layering effusions. Electronically Signed   By: Rolm Baptise M.D.   On: 01/05/2018 07:22   [place] Ekg Site Rite  Result Date: 01/05/2018 If Site Rite image not attached, placement could not be confirmed due to current cardiac rhythm.    Assessment/Plan: S/P Procedure(s) (LRB): AORTIC VALVE REPLACEMENT (AVR) using 50mm Magna Ease Bioprosthesis Aortic Valve (N/A) CORONARY ARTERY BYPASS GRAFTING (CABG) x 4 (LIMA to LAD, SVG to DIAGONAL, SVG to RAMUS INTERMEDIATE, and SVG to PDA), USING LEFT INFTERNAL MAMMARY ARTERY AND (N/A) TRANSESOPHAGEAL ECHOCARDIOGRAM (TEE) (N/A) ENDARTERECTOMY CAROTID (Right)   1. CV-Remains in a fib post op with HR up to 110's. On Amiodarone, Milrinone, and now Dopamine drips. Co ox to be drawn this am (was 59 yesterday). Also, on Lopressor 12.5 mg bid. Per  Dr. Prescott Gum, will stop Amiodarone drip and start oral Amiodarone 400 mg bid.  2. Pulmonary-On 4 liters of oxygen via [place]. Will wean over the next days. Chest tubes with 570 cc last 24 hours. CXR this am shows no pneumothorax, worsening bibasilar atelectasis, and small pleural effusions. Encourage incentive spirometer. 3. Volume overload-on Lasix drip 4. ABL anemia-H and H slightly decreased this am to 9.2 and 27.7 5. As discussed with Dr. Prescott Gum, Lovenox for DVT prophylaxis and remove sleeve 6. DM-CBGs 177/146/120. On Insulin. Will restart Metformin XR once tolerating oral better and as long as creatinine  remains normal. Pre op HGA1C 5.9   Donielle Liston Alba PA-C 01/05/2018 9:42 AM   Chest x-ray shows increased interstitial edema so we will push IV Lasix diuresis and add renal dose dopamine. Ambulate and encourage incentive spirometry patient examined and medical record reviewed,agree with above note. Tharon Aquas Trigt III 01/05/2018

## 2018-01-05 NOTE — Telephone Encounter (Signed)
sch appt spk to pt wife 01/25/2018 830am p/o MD

## 2018-01-06 ENCOUNTER — Other Ambulatory Visit: Payer: Self-pay | Admitting: Nurse Practitioner

## 2018-01-06 ENCOUNTER — Inpatient Hospital Stay (HOSPITAL_COMMUNITY): Payer: BLUE CROSS/BLUE SHIELD

## 2018-01-06 DIAGNOSIS — I2583 Coronary atherosclerosis due to lipid rich plaque: Secondary | ICD-10-CM

## 2018-01-06 DIAGNOSIS — Z951 Presence of aortocoronary bypass graft: Secondary | ICD-10-CM

## 2018-01-06 DIAGNOSIS — Z953 Presence of xenogenic heart valve: Secondary | ICD-10-CM

## 2018-01-06 DIAGNOSIS — I2510 Atherosclerotic heart disease of native coronary artery without angina pectoris: Secondary | ICD-10-CM

## 2018-01-06 DIAGNOSIS — I5021 Acute systolic (congestive) heart failure: Secondary | ICD-10-CM

## 2018-01-06 LAB — BASIC METABOLIC PANEL
Anion gap: 9 (ref 5–15)
BUN: 15 mg/dL (ref 8–23)
CO2: 27 mmol/L (ref 22–32)
Calcium: 7.9 mg/dL — ABNORMAL LOW (ref 8.9–10.3)
Chloride: 96 mmol/L — ABNORMAL LOW (ref 98–111)
Creatinine, Ser: 0.81 mg/dL (ref 0.61–1.24)
GFR calc Af Amer: >60 mL/min (ref >60)
GFR calc non Af Amer: >60 mL/min (ref >60)
GLUCOSE: 116 mg/dL — AB (ref 70–99)
Potassium: 3.3 mmol/L — ABNORMAL LOW (ref 3.5–5.1)
Sodium: 132 mmol/L — ABNORMAL LOW (ref 135–145)

## 2018-01-06 LAB — CBC
HEMATOCRIT: 25.9 % — AB (ref 39.0–52.0)
Hemoglobin: 8.5 g/dL — ABNORMAL LOW (ref 13.0–17.0)
MCH: 28.9 pg (ref 26.0–34.0)
MCHC: 32.8 g/dL (ref 30.0–36.0)
MCV: 88.1 fL (ref 80.0–100.0)
Platelets: 233 10*3/uL (ref 150–400)
RBC: 2.94 MIL/uL — ABNORMAL LOW (ref 4.22–5.81)
RDW: 28.5 % — ABNORMAL HIGH (ref 11.5–15.5)
WBC: 41.8 10*3/uL — AB (ref 4.0–10.5)
nRBC: 0.7 % — ABNORMAL HIGH (ref 0.0–0.2)

## 2018-01-06 LAB — HEPATIC FUNCTION PANEL
ALT: 24 U/L (ref 0–44)
AST: 48 U/L — ABNORMAL HIGH (ref 15–41)
Albumin: 2.3 g/dL — ABNORMAL LOW (ref 3.5–5.0)
Alkaline Phosphatase: 57 U/L (ref 38–126)
Bilirubin, Direct: 0.4 mg/dL — ABNORMAL HIGH (ref 0.0–0.2)
Indirect Bilirubin: 1 mg/dL — ABNORMAL HIGH (ref 0.3–0.9)
Total Bilirubin: 1.4 mg/dL — ABNORMAL HIGH (ref 0.3–1.2)
Total Protein: 4.6 g/dL — ABNORMAL LOW (ref 6.5–8.1)

## 2018-01-06 LAB — COOXEMETRY PANEL
Carboxyhemoglobin: 2.4 % — ABNORMAL HIGH (ref 0.5–1.5)
Methemoglobin: 1.3 % (ref 0.0–1.5)
O2 Saturation: 84.4 %
Total hemoglobin: 8.9 g/dL — ABNORMAL LOW (ref 12.0–16.0)

## 2018-01-06 LAB — SEDIMENTATION RATE: Sed Rate: 26 mm/hr — ABNORMAL HIGH (ref 0–16)

## 2018-01-06 LAB — GLUCOSE, CAPILLARY
Glucose-Capillary: 101 mg/dL — ABNORMAL HIGH (ref 70–99)
Glucose-Capillary: 229 mg/dL — ABNORMAL HIGH (ref 70–99)
Glucose-Capillary: 26 mg/dL — CL (ref 70–99)
Glucose-Capillary: 52 mg/dL — ABNORMAL LOW (ref 70–99)
Glucose-Capillary: 65 mg/dL — ABNORMAL LOW (ref 70–99)
Glucose-Capillary: 73 mg/dL (ref 70–99)
Glucose-Capillary: 79 mg/dL (ref 70–99)
Glucose-Capillary: 94 mg/dL (ref 70–99)

## 2018-01-06 LAB — POCT I-STAT, CHEM 8
BUN: 15 mg/dL (ref 8–23)
Calcium, Ion: 1.12 mmol/L — ABNORMAL LOW (ref 1.15–1.40)
Chloride: 90 mmol/L — ABNORMAL LOW (ref 98–111)
Creatinine, Ser: 0.7 mg/dL (ref 0.61–1.24)
Glucose, Bld: 111 mg/dL — ABNORMAL HIGH (ref 70–99)
HCT: 28 % — ABNORMAL LOW (ref 39.0–52.0)
Hemoglobin: 9.5 g/dL — ABNORMAL LOW (ref 13.0–17.0)
Potassium: 3 mmol/L — ABNORMAL LOW (ref 3.5–5.1)
Sodium: 131 mmol/L — ABNORMAL LOW (ref 135–145)
TCO2: 30 mmol/L (ref 22–32)

## 2018-01-06 MED ORDER — INSULIN GLARGINE 100 UNIT/ML ~~LOC~~ SOLN
10.0000 [IU] | Freq: Two times a day (BID) | SUBCUTANEOUS | Status: DC
  Administered 2018-01-06: 10 [IU] via SUBCUTANEOUS
  Filled 2018-01-06 (×3): qty 0.1

## 2018-01-06 MED ORDER — LEVALBUTEROL HCL 0.63 MG/3ML IN NEBU: 0.6300 mg | INHALATION_SOLUTION | Freq: Four times a day (QID) | RESPIRATORY_TRACT | Status: DC | PRN

## 2018-01-06 MED ORDER — INSULIN ASPART 100 UNIT/ML ~~LOC~~ SOLN: 0.0000 [IU] | Freq: Three times a day (TID) | SUBCUTANEOUS | Status: DC

## 2018-01-06 MED ORDER — FE FUMARATE-B12-VIT C-FA-IFC PO CAPS
1.0000 | ORAL_CAPSULE | Freq: Every day | ORAL | Status: DC
  Administered 2018-01-06 – 2018-01-10 (×5): 1 via ORAL
  Filled 2018-01-06 (×5): qty 1

## 2018-01-06 MED ORDER — METOLAZONE 5 MG PO TABS
5.0000 mg | ORAL_TABLET | Freq: Once | ORAL | Status: AC
  Administered 2018-01-06: 5 mg via ORAL
  Filled 2018-01-06: qty 1

## 2018-01-06 MED ORDER — GUAIFENESIN ER 600 MG PO TB12
600.0000 mg | ORAL_TABLET | Freq: Two times a day (BID) | ORAL | Status: DC
  Administered 2018-01-06 – 2018-01-10 (×9): 600 mg via ORAL
  Filled 2018-01-06 (×9): qty 1

## 2018-01-06 MED ORDER — POTASSIUM CHLORIDE 10 MEQ/50ML IV SOLN
10.0000 meq | INTRAVENOUS | Status: AC
  Administered 2018-01-06 (×3): 10 meq via INTRAVENOUS
  Filled 2018-01-06: qty 50

## 2018-01-06 MED ORDER — POTASSIUM CHLORIDE 10 MEQ/50ML IV SOLN
10.0000 meq | INTRAVENOUS | Status: AC | PRN
  Administered 2018-01-06 (×3): 10 meq via INTRAVENOUS
  Filled 2018-01-06 (×3): qty 50

## 2018-01-06 MED ORDER — RESOURCE THICKENUP CLEAR PO POWD
ORAL | Status: DC | PRN
  Filled 2018-01-06: qty 125

## 2018-01-06 MED ORDER — POTASSIUM CHLORIDE CRYS ER 20 MEQ PO TBCR
40.0000 meq | EXTENDED_RELEASE_TABLET | Freq: Once | ORAL | Status: AC
  Administered 2018-01-06: 40 meq via ORAL
  Filled 2018-01-06: qty 2

## 2018-01-06 MED FILL — Mannitol IV Soln 20%: INTRAVENOUS | Qty: 500 | Status: AC

## 2018-01-06 MED FILL — Calcium Chloride Inj 10%: INTRAVENOUS | Qty: 10 | Status: AC

## 2018-01-06 MED FILL — Sodium Chloride IV Soln 0.9%: INTRAVENOUS | Qty: 3000 | Status: AC

## 2018-01-06 MED FILL — Potassium Chloride Inj 2 mEq/ML: INTRAVENOUS | Qty: 20 | Status: AC

## 2018-01-06 MED FILL — Lidocaine HCl(Cardiac) IV PF Soln Pref Syr 100 MG/5ML (2%): INTRAVENOUS | Qty: 5 | Status: AC

## 2018-01-06 MED FILL — Electrolyte-R (PH 7.4) Solution: INTRAVENOUS | Qty: 5000 | Status: AC

## 2018-01-06 MED FILL — Heparin Sodium (Porcine) Inj 1000 Unit/ML: INTRAMUSCULAR | Qty: 30 | Status: AC

## 2018-01-06 MED FILL — Sodium Bicarbonate IV Soln 8.4%: INTRAVENOUS | Qty: 50 | Status: AC

## 2018-01-06 MED FILL — Albumin, Human Inj 5%: INTRAVENOUS | Qty: 250 | Status: AC

## 2018-01-06 NOTE — Consult Note (Addendum)
Cardiology Consultation:   Patient ID: Justin [name] MRN: [id number]; DOB: November 13, 1955  Admit date: 01/03/2018 Date of Consult: 01/06/2018  Primary Care Provider: [name], MD Primary Cardiologist: Buford Dresser, MD  Patient Profile:   Justin [name] is a 62 y.o. male with a hx of bicuspid aortic valve stenosis, PVD, obesity, diabetes, tobacco use, atrial fibrillation who is being seen today for the evaluation of postoperative cardiovascular medical management at the request of Dr. Prescott Gum.  History of Present Illness:   Justin [name] was admitted on 01/03/18 after outpatient testing found severe multivessel CAD, moderate AS with bicuspid aortic valve, and 90% right carotid stenosis. He underwent right carotid endarterectomy by Dr. Carlis Abbott and then CABG with AVR by Dr. Prescott Gum on 01/03/18.   He has known afib and has had continued afib post operatively. He has had some hoarseness post op and cough, but he feels this continues to improve.  Overall he is tired. Pain is well controlled. Still on IV drips. Has ambulated the halls.  Past Medical History:  Diagnosis Date  . Acute meniscal tear of knee LEFT  . Aortic stenosis 12/01/2017   NONRHEUMATIC, AORTIC VALVE CALCIFICATIONS, MILD TO MODERATE REGURG, MILD TO MODERATE CALCIFIED ANNULUS per ECHO 10/25/17 @ [facility]  . Arthritis   . Atrial fibrillation ([place]) 11/12/2017   AT O/V WITH PCP  . DM (diabetes mellitus) ([place])   . Heart murmur MILD-- ASYMPTOMATIC  . Hyperlipidemia   . Hypertension   . Left knee pain   . PAD (peripheral artery disease) ([facility])    left leg claudication    Past Surgical History:  Procedure Laterality Date  . AORTIC VALVE REPLACEMENT N/A 01/03/2018   Procedure: AORTIC VALVE REPLACEMENT (AVR) using 36mm Magna Ease Bioprosthesis Aortic Valve;  Surgeon: Ivin Poot, MD;  Location: [facility];  Service: Open Heart Surgery;  Laterality: N/A;  . APPENDECTOMY  1998  . CATARACT  EXTRACTION W/ INTRAOCULAR LENS  IMPLANT, BILATERAL  1998/  2000  . CERVICAL FUSION  1985   C4 - 5  . CORONARY ARTERY BYPASS GRAFT N/A 01/03/2018   Procedure: CORONARY ARTERY BYPASS GRAFTING (CABG) x 4 (LIMA to LAD, SVG to DIAGONAL, SVG to RAMUS INTERMEDIATE, and SVG to PDA), USING LEFT INFTERNAL MAMMARY ARTERY AND;  Surgeon: Prescott Gum, Collier Salina, MD;  Location: [place];  Service: Open Heart Surgery;  Laterality: N/A;  . ENDARTERECTOMY Right 01/03/2018   Procedure: ENDARTERECTOMY CAROTID;  Surgeon: Marty Heck, MD;  Location: [place];  Service: Vascular;  Laterality: Right;  . KNEE ARTHROSCOPY W/ MENISCECTOMY  1991   LEFT KNEE  . NASAL SINUS SURGERY  1982  . RIGHT/LEFT HEART CATH AND CORONARY ANGIOGRAPHY N/A 12/24/2017   Procedure: RIGHT/LEFT HEART CATH AND CORONARY ANGIOGRAPHY;  Surgeon: Jolaine Artist, MD;  Location: [facility];  Service: Cardiovascular;  Laterality: N/A;  . ROTATOR CUFF REPAIR  09-04-2005   LEFT SHOULDER  . TEE WITHOUT CARDIOVERSION N/A 12/24/2017   Procedure: TRANSESOPHAGEAL ECHOCARDIOGRAM (TEE);  Surgeon: Jolaine Artist, MD;  Location: [facility];  Service: Cardiovascular;  Laterality: N/A;  . TEE WITHOUT CARDIOVERSION N/A 01/03/2018   Procedure: TRANSESOPHAGEAL ECHOCARDIOGRAM (TEE);  Surgeon: Prescott Gum, Collier Salina, MD;  Location: [place];  Service: Open Heart Surgery;  Laterality: N/A;  . TONSILLECTOMY  AGE 41     Home Medications:  Prior to Admission medications   Medication Sig Start Date End Date Taking? Authorizing Provider  aspirin 81 MG tablet Take 81 mg  by mouth daily.    Yes [provider]  atenolol (TENORMIN) 50 MG tablet Take 50 mg by mouth daily.   Yes [provider]  Cholecalciferol 125 MCG (5000 UT) capsule Take 1 capsule by mouth daily.    Yes [provider]  cyclobenzaprine (FLEXERIL) 10 MG tablet Take 10 mg by mouth 3 (three) times daily.    Yes [provider]  fluticasone (FLONASE) 50 MCG/ACT nasal  spray Place 2 sprays into both nostrils daily.   Yes [provider]  hydrochlorothiazide (MICROZIDE) 12.5 MG capsule Take 12.5 mg by mouth daily.   Yes [provider]  HYDROcodone-acetaminophen (NORCO/VICODIN) 5-325 MG per tablet Take 1 tablet by mouth every 6 (six) hours as needed for moderate pain.   Yes [provider]  hydroxyurea (HYDREA) 500 MG capsule Take 1 capsule (500 mg total) by mouth 2 (two) times daily. May take with food to minimize GI side effects. 12/03/17  Yes Henreitta Leber, MD  loratadine (CLARITIN) 10 MG tablet Take 10 mg by mouth daily.   Yes [provider]  meloxicam (MOBIC) 15 MG tablet Take 15 mg by mouth daily.    Yes [provider]  metFORMIN (GLUCOPHAGE-XR) 500 MG 24 hr tablet Take 500 mg by mouth daily before supper.   Yes [provider]  Multiple Vitamin (MULTIVITAMIN) tablet Take 1 tablet by mouth daily.   Yes [provider]  nefazodone (SERZONE) 100 MG tablet Take 100 mg by mouth 2 (two) times daily.   Yes [provider]  Omega 3 1200 MG CAPS Take 1 capsule by mouth 2 (two) times daily.   Yes [provider]  pregabalin (LYRICA) 50 MG capsule Take 50 mg by mouth 3 (three) times daily.   Yes [provider]  simvastatin (ZOCOR) 40 MG tablet Take 40 mg by mouth every evening.   Yes [provider]  zolpidem (AMBIEN) 10 MG tablet Take 10 mg by mouth at bedtime as needed.   Yes [provider]  apixaban (ELIQUIS) 5 MG TABS tablet Take 5 mg by mouth 2 (two) times daily.    [provider]    Inpatient Medications: Scheduled Meds: . acetaminophen  1,000 mg Oral Q6H   Or  . acetaminophen (TYLENOL) oral liquid 160 mg/5 mL  1,000 mg Per Tube Q6H  . amiodarone  400 mg Oral BID  . aspirin EC  325 mg Oral Daily   Or  . aspirin  324 mg Per Tube Daily  . atorvastatin  20 mg Oral q1800  . bisacodyl  10 mg Oral Daily   Or  . bisacodyl  10 mg Rectal  Daily  . Chlorhexidine Gluconate Cloth  6 each Topical Daily  . Chlorhexidine Gluconate Cloth  6 each Topical Daily  . docusate sodium  200 mg Oral Daily  . enoxaparin (LOVENOX) injection  40 mg Subcutaneous Q24H  . ferrous [id number] C-folic acid  1 capsule Oral Q breakfast  . finasteride  5 mg Oral Daily  . fluticasone  2 spray Each Nare Daily  . guaiFENesin  600 mg Oral BID  . insulin aspart  0-24 Units Subcutaneous TID AC & HS  . insulin glargine  10 Units Subcutaneous BID  . levalbuterol  0.63 mg Nebulization TID  . loratadine  10 mg Oral Daily  . mouth rinse  15 mL Mouth Rinse BID  . metoCLOPramide (REGLAN) injection  10 mg Intravenous Q6H  . metoprolol tartrate  12.5 mg  Oral BID  . mupirocin ointment  1 application Nasal BID  . nefazodone  50 mg Oral BID  . pantoprazole  40 mg Oral Daily  . pregabalin  50 mg Oral TID  . sodium chloride flush  10-40 mL Intracatheter Q12H  . sodium chloride flush  3 mL Intravenous Q12H   Continuous Infusions: . sodium chloride Stopped (01/04/18 1000)  . sodium chloride 250 mL (01/04/18 0617)  . sodium chloride Stopped (01/04/18 1100)  . DOPamine 2.5 mcg/kg/min (01/06/18 0900)  . furosemide (LASIX) infusion 10 mg/hr (01/06/18 0900)  . lactated ringers    . magnesium sulfate    . milrinone 0.125 mcg/kg/min (01/06/18 0900)   PRN Meds: sodium chloride, metoprolol tartrate, ondansetron (ZOFRAN) IV, RESOURCE THICKENUP CLEAR, sodium chloride flush, sodium chloride flush, traMADol, zolpidem  Allergies:    Allergies  Allergen Reactions  . Codeine Swelling    SWELLING REACTION UNSPECIFIED  Tolerates hydrocodone and oxycodone     Social History:   Social History   Socioeconomic History  . Marital status: Married    Spouse name: Not on file  . Number of children: Not on file  . Years of education: Not on file  . Highest education level: Not on file  Occupational History  . Not on file  Social Needs  . Financial resource  strain: Not on file  . Food insecurity:    Worry: Not on file    Inability: Not on file  . Transportation needs:    Medical: Not on file    Non-medical: Not on file  Tobacco Use  . Smoking status: Current Some Day Smoker    Packs/day: 0.25    Years: 23.00    Pack years: 5.75    Types: Cigars  . Smokeless tobacco: Never Used  . Tobacco comment: quit cigs in 2011 and smokes cigars daily- from 3-10 cigars-09/27/13  Substance and Sexual Activity  . Alcohol use: Yes    Comment: OCCASIONAL  . Drug use: No  . Sexual activity: Not on file  Lifestyle  . Physical activity:    Days per week: Not on file    Minutes per session: Not on file  . Stress: Not on file  Relationships  . Social connections:    Talks on phone: Not on file    Gets together: Not on file    Attends religious service: Not on file    Active member of club or organization: Not on file    Attends meetings of clubs or organizations: Not on file    Relationship status: Not on file  . Intimate partner violence:    Fear of current or ex partner: Not on file    Emotionally abused: Not on file    Physically abused: Not on file    Forced sexual activity: Not on file  Other Topics Concern  . Not on file  Social History Narrative  . Not on file    Family History:    Family History  Adopted: Yes  Problem Relation Age of Onset  . Cancer Father      ROS:  Please see the history of present illness.  Review of Systems  Constitutional: Positive for malaise/fatigue. Negative for chills and fever.  HENT: Positive for sore throat. Negative for sinus pain.   Eyes: Negative for double vision and pain.  Respiratory: Positive for cough and sputum production. Negative for wheezing.   Cardiovascular: Negative for chest pain, palpitations, orthopnea, claudication, leg swelling and PND.  Gastrointestinal: Positive  for constipation. Negative for abdominal pain, blood in stool and melena.  Genitourinary: Negative for dysuria and  hematuria.  Musculoskeletal: Positive for joint pain. Negative for falls.  Neurological: Negative for sensory change, speech change and loss of consciousness.   All other ROS reviewed and negative.     Physical Exam/Data:   Vitals:   01/06/18 0737 01/06/18 0800 01/06/18 0900 01/06/18 0930  BP:  97/72 105/88 (!) 109/97  Pulse:  93 93 87  Resp:      Temp: 97.9 F (36.6 C)     TempSrc: Oral     SpO2:  97% 97% 98%  Weight:      Height:        Intake/Output Summary (Last 24 hours) at 01/06/2018 1003 Last data filed at 01/06/2018 0900 Gross per 24 hour  Intake 782.21 ml  Output 2757 ml  Net -1974.79 ml   Filed Weights   01/04/18 0500 01/05/18 0500 01/06/18 0500  Weight: 94 kg 96.6 kg 96.7 kg   Body mass index is 32.41 kg/m.  General:  Well nourished, well developed, in no acute distress HEENT: normal Lymph: no adenopathy Neck: bilateral neck dressings, no JVD appreciate above dressings Endocrine:  No thryomegaly Vascular: RA pulses 2+ bilaterally. R CEA site c/d/i Cardiac:  Irregularly irregular S1, S2; RRR; no murmur Lungs:  Upper fields clear, some coarse rhonchi, slightly diminished at bases Abd: soft, nontender, no hepatomegaly  Ext: trace BL LE edema Musculoskeletal:  No deformities, has been ambulating Skin: warm and dry  Neuro:  no focal abnormalities noted Psych:  Normal affect   EKG:  The EKG was personally reviewed and demonstrates:  NSR 12/17 Telemetry:  Telemetry was personally reviewed and demonstrates:  Atrial fibrillation  Relevant CV Studies: Cath, TEE, TTE, vasc studies prior to surgery reviewed personally  Laboratory Data:  Chemistry Recent Labs  Lab 01/05/18 0354 01/05/18 1807 01/06/18 0318  NA 133* 132* 132*  K 4.0 3.5 3.3*  CL 99 97* 96*  CO2 24 25 27   GLUCOSE 141* 173* 116*  BUN 13 15 15   CREATININE 0.97 0.89 0.81  CALCIUM 8.0* 7.9* 7.9*  GFRNONAA >60 >60 >60  GFRAA >60 >60 >60  ANIONGAP 10 10 9     Recent Labs  Lab  12/30/17 1113 01/05/18 0354 01/06/18 0318  PROT 7.5 4.8* 4.6*  ALBUMIN 3.4* 2.5* 2.3*  AST 36 82* 48*  ALT 18 26 24   ALKPHOS 99 54 57  BILITOT 1.2 1.1 1.4*   Hematology Recent Labs  Lab 01/04/18 1700 01/05/18 0354 01/06/18 0318  WBC 34.9* 37.3* 41.8*  RBC 3.42* 3.14* 2.94*  HGB 9.6* 9.2* 8.5*  HCT 30.6* 27.7* 25.9*  MCV 89.5 88.2 88.1  MCH 28.1 29.3 28.9  MCHC 31.4 33.2 32.8  RDW 29.0* 28.8* 28.5*  PLT 190 220 233   Cardiac EnzymesNo results for input(s): TROPONINI in the last 168 hours. No results for input(s): TROPIPOC in the last 168 hours.  BNPNo results for input(s): BNP, PROBNP in the last 168 hours.  DDimer No results for input(s): DDIMER in the last 168 hours.  Radiology/Studies:  Dg Chest Port 1 View  Result Date: 01/06/2018 CLINICAL DATA:  Shortness of breath, pleural effusion EXAM: PORTABLE CHEST 1 VIEW COMPARISON:  01/05/2018 FINDINGS: Right PICC line and left chest tube remain in place, unchanged. No visible pneumothorax. Prior CABG. Cardiomegaly with vascular congestion. Diffuse interstitial and alveolar opacities have increased slightly since prior study, likely worsening edema. Small bilateral effusions. IMPRESSION: Slight interval worsening  in pulmonary edema pattern. Small bilateral effusions. Electronically Signed   By: Rolm Baptise M.D.   On: 01/06/2018 07:23   Dg Chest Port 1 View  Result Date: 01/05/2018 CLINICAL DATA:  Peripherally inserted catheter EXAM: PORTABLE CHEST 1 VIEW COMPARISON:  Earlier today FINDINGS: Right upper extremity PICC with tip at the upper SVC. Left IJ sheath in stable position. Chest tubes in stable position. There is artifact from EKG leads. Unchanged low lung volumes with mild elevation of the left diaphragm. There is atelectatic opacity, vascular congestion, and trace effusions. IMPRESSION: 1. New PICC with tip at the upper SVC. 2. Improved pulmonary edema. Electronically Signed   By: Monte Fantasia M.D.   On: 01/05/2018 10:33    Dg Chest Port 1 View  Result Date: 01/05/2018 CLINICAL DATA:  Chest tube, post AVR EXAM: PORTABLE CHEST 1 VIEW COMPARISON:  01/04/2018 FINDINGS: Interval removal of Swan-Ganz catheter, endotracheal tube and NG tube. Left chest tube remains in place. No visible pneumothorax. Cardiomegaly with vascular congestion and diffuse interstitial opacities, slightly increased, likely edema. Bibasilar atelectasis and small layering effusions. IMPRESSION: Interval extubation. No pneumothorax. Increasing interstitial opacities, likely edema. Increasing bibasilar atelectasis. Small layering effusions. Electronically Signed   By: Rolm Baptise M.D.   On: 01/05/2018 07:22   Dg Chest Port 1 View  Result Date: 01/04/2018 CLINICAL DATA:  ETT evaluation. EXAM: PORTABLE CHEST 1 VIEW COMPARISON:  December 24, 2016 FINDINGS: The ETT terminates in good position. The PA catheter is unchanged in good position. The enteric tube terminates below today's film. Persistent cardiomegaly. The hila and mediastinum are unchanged. The right lung is clear. Mild opacity remains in the left base. There is a left-sided chest tube without pneumothorax. IMPRESSION: 1. Support apparatus as above. 2. Opacity remains in the left base, probably atelectasis. Recommend attention on follow-up. 3. No other change. Electronically Signed   By: Dorise Bullion III M.D   On: 01/04/2018 08:34   Dg Chest Portable 1 View  Result Date: 01/03/2018 CLINICAL DATA:  CABG. EXAM: PORTABLE CHEST 1 VIEW COMPARISON:  Chest x-ray dated December 30, 2017. FINDINGS: Endotracheal tube in position with the tip 4.2 cm above the level of the carina. Right internal jugular Swan-Ganz catheter with the tip in the main pulmonary outflow tract. Mediastinal and left chest tubes are in good position. Interval CABG, AVR, and left atrial appendage clipping. Small left pleural effusion with left lower lobe consolidation/atelectasis. Patchy increased density in the left mid lung may  reflect asymmetric pulmonary edema. The right lung is clear. No pneumothorax. No acute osseous abnormality. IMPRESSION: 1. Lines and tubes as above.  No pneumothorax. 2. Small left pleural effusion and asymmetric left-sided pulmonary edema. Left lower lobe consolidation/atelectasis. Electronically Signed   By: Titus Dubin M.D.   On: 01/03/2018 20:22   Dg Chest Port 1 View  Result Date: 01/03/2018 CLINICAL DATA:  Status post aortic valve replacement EXAM: PORTABLE CHEST 1 VIEW COMPARISON:  Chest radiograph 01/03/2018 at 18:18 FINDINGS: Support Apparatus: --Endotracheal tube: Tip at the level of the clavicular heads. --Enteric tube:Sideport projects over the distal esophagus. Recommend advancing by 7 cm. --Catheter(s):Left IJ approach pulmonary arterial catheter with tip projecting over the main pulmonary artery. --Other: Left chest tube tip projects over the left mid lung. Paramedian mediastinal drain. Moderate cardiomegaly with mild airspace opacity in the left lung. Right lung is clear. IMPRESSION: 1. Recommend advancing the enteric tube by 7 cm to ensure side port below the gastroesophageal junction. 2. Satisfactory appearance of the  remainder of the support apparatus. 3. Mild left lung atelectasis. Electronically Signed   By: Ulyses Jarred M.D.   On: 01/03/2018 19:50   [place] Ekg Site Rite  Result Date: 01/05/2018 If Site Rite image not attached, placement could not be confirmed due to current cardiac rhythm.   Assessment and Plan:   1. Severe multivessel CAD and moderate-severe bicuspid aortic valve stenosis, with asymptomatic severe R carotid stenosis: s/p R CEA, 4V CABG (LIMA-LAD, SVG-Diag, SVG-RI, SVG-PDA), and AVR with 23 mm Magna Ease Bioprosthesis on 01/03/18 (Dr. Algie Coffer. Prescott Gum)  Appears to have a component of post-operative volume overload, EF was 40-45 prior to surgery. May be acute systolic heart failure component. -lungs appear to still have fluid, legs with some edema. Agree  with lasix drip. Albumin is 2.3, was 3.4 prior to surgery. Creatinine stable. Replete K aggressively on lasix drip -still requiring dopamine, milrinone. Co-ox this AM was 84%. Dopamine likely to drive HR higher but this appears to be controlled with metoprolol/amiodarone. Agree with continuing inotropes while diuresing. -weight today 96.7, weight on 12/12 prior to admission was 89.4 kg -on aspirin 325 mg, change to 8a mg once apixaban restarted -on atorvastatin 20 mg. LFTs only mildly elevated. Will monitor, ideally would like higher dose prior to discharge.  -once chest tube out and more diuresed, would recheck echo  2. History of paroxysmal atrial fibrillation: Chadsvasc=3. -in afib post op. On amiodarone and metoprolol -was on apixaban as an outpatient. Wait to restart until closer to discharge. Does not need bridging.  For questions or updates, please contact [place] Please consult [email or web address] for contact info under   CRITICAL CARE Patient requires continued critical care treatment, needs inotropes and close monitoring for continued diuresis.  Signed, Buford Dresser, MD  01/06/2018 10:03 AM

## 2018-01-06 NOTE — Progress Notes (Signed)
CT surgery p.m. Rounds   Patient resting comfortably after walking twice today Excellent diuresis Milrinone weaned off Rate controlled atrial fibrillation Now on room air Plan transfer to stepdown tomorrow

## 2018-01-06 NOTE — Progress Notes (Addendum)
Physical Therapy Treatment Patient Details Name: Justin [name] MRN: [id number] DOB: 08-Oct-1955 Today's Date: 01/06/2018    History of Present Illness Pt adm for CABG x 4 and AVR and simultaneous rt carotid endarterectomy on 12/16. PMH - CAD, afib, PAD, arthritis, HTN, cervical fusion    PT Comments    Pt making steady progress.    Follow Up Recommendations  No PT follow up(if he progresses as expected)     Equipment Recommendations  Other (comment)(To be determined)    Recommendations for Other Services       Precautions / Restrictions Precautions Precautions: Sternal    Mobility  Bed Mobility Overal bed mobility: Needs Assistance Bed Mobility: Sit to Sidelying         Sit to sidelying: Mod assist General bed mobility comments: Assist to bring legs up into bed  Transfers Overall transfer level: Needs assistance Equipment used: 4-wheeled walker Transfers: Sit to/from Stand Sit to Stand: Mod assist         General transfer comment: Assist to bring hips up and for lines. Verbal cues for hand placement  Ambulation/Gait Ambulation/Gait assistance: Min assist Gait Distance (Feet): 190 Feet Assistive device: 4-wheeled walker(EVA walker) Gait Pattern/deviations: Step-through pattern;Decreased stride length;Trunk flexed Gait velocity: decr Gait velocity interpretation: 1.31 - 2.62 ft/sec, indicative of limited community ambulator General Gait Details: Assist for safety and control. Verbal cues to stand more erect and not lean forward onto the walker. 2 standing rest breaks. Amb on RA. Difficulty getting SpO2 reading. After amb SpO2 >95% with good pleth.   Stairs             Wheelchair Mobility    Modified Rankin (Stroke Patients Only)       Balance Overall balance assessment: Needs assistance Sitting-balance support: No upper extremity supported;Feet supported Sitting balance-Leahy Scale: Good     Standing balance support: No upper extremity  supported Standing balance-Leahy Scale: Fair Standing balance comment: Assist for balance with dynamic activities.                            Cognition Arousal/Alertness: Awake/alert Behavior During Therapy: WFL for tasks assessed/performed Overall Cognitive Status: Within Functional Limits for tasks assessed                                        Exercises      General Comments        Pertinent Vitals/Pain Pain Assessment: 0-10 Pain Score: 2  Pain Location: surgical site Pain Descriptors / Indicators: Guarding Pain Intervention(s): Limited activity within patient's tolerance;Repositioned    Home Living                      Prior Function            PT Goals (current goals can now be found in the care plan section) Progress towards PT goals: Progressing toward goals    Frequency    Min 3X/week      PT Plan Current plan remains appropriate    Co-evaluation              AM-PAC PT "6 Clicks" Mobility   Outcome Measure  Help needed turning from your back to your side while in a flat bed without using bedrails?: A Little Help needed moving from lying on your back to sitting on  the side of a flat bed without using bedrails?: A Little Help needed moving to and from a bed to a chair (including a wheelchair)?: A Little Help needed standing up from a chair using your arms (e.g., wheelchair or bedside chair)?: A Little Help needed to walk in hospital room?: A Little Help needed climbing 3-5 steps with a railing? : A Lot 6 Click Score: 17    End of Session   Activity Tolerance: Patient tolerated treatment well Patient left: in bed;with call bell/phone within reach;with nursing/sitter in room Nurse Communication: Mobility status PT Visit Diagnosis: Other abnormalities of gait and mobility (R26.89)     Time: 2025-4270 PT Time Calculation (min) (ACUTE ONLY): 21 min  Charges:  $Gait Training: 8-22 mins                      [place] Pager [phone] Office [place] [address] 01/06/2018, 1:51 PM

## 2018-01-06 NOTE — Progress Notes (Addendum)
  Progress Note    01/06/2018 8:31 AM 3 Days Post-Op  Subjective:  Says his throat is sore.  He denies any choking while eating or drinking.    Afebrile HR 80's-90's afib 00'F-749'S systolic 49% 6PR9FM  Gtts:   Dopamine Milrinone Lasix  Vitals:   01/06/18 0630 01/06/18 0737  BP: 93/77   Pulse: (!) 110   Resp: (!) 21   Temp:  97.9 F (36.6 C)  SpO2: 90%      Physical Exam: Neuro:  In tact; tongue is midline Lungs:  Non labored Incision:  Clean and dry; scant drainage from drain exit site.   CBC    Component Value Date/Time   WBC 41.8 (H) 01/06/2018 0318   RBC 2.94 (L) 01/06/2018 0318   HGB 8.5 (L) 01/06/2018 0318   HGB 17.5 (H) 12/21/2017 0948   HCT 25.9 (L) 01/06/2018 0318   PLT 233 01/06/2018 0318   PLT 314 12/21/2017 0948   MCV 88.1 01/06/2018 0318   MCH 28.9 01/06/2018 0318   MCHC 32.8 01/06/2018 0318   RDW 28.5 (H) 01/06/2018 0318   LYMPHSABS 1.6 12/21/2017 0948   MONOABS 1.2 (H) 12/21/2017 0948   EOSABS 0.2 12/21/2017 0948   BASOSABS 0.4 (H) 12/21/2017 0948    BMET    Component Value Date/Time   NA 132 (L) 01/06/2018 0318   K 3.3 (L) 01/06/2018 0318   CL 96 (L) 01/06/2018 0318   CO2 27 01/06/2018 0318   GLUCOSE 116 (H) 01/06/2018 0318   BUN 15 01/06/2018 0318   CREATININE 0.81 01/06/2018 0318   CALCIUM 7.9 (L) 01/06/2018 0318   GFRNONAA >60 01/06/2018 0318   GFRAA >60 01/06/2018 0318     Intake/Output Summary (Last 24 hours) at 01/06/2018 0831 Last data filed at 01/06/2018 0600 Gross per 24 hour  Intake 820.45 ml  Output 2837 ml  Net -2016.55 ml     Assessment/Plan:  This is a 62 y.o. male who is s/p right CEA/CABG 3 Days Post-Op  -pt is doing well this am.  No difficulty swallowing, but does have a sore throat.  No choking. -pt neuro exam is in tact -pt has ambulated -f/u with Dr. Carlis Abbott in 2-3 weeks.   Leontine Locket, PA-C [facility] [phone]  I have seen and evaluated the patient. I agree with the  PA note as documented above. POD#3 s/p R CEA and overall making progress.  Drain out.  Neck incision c/d/i.  Agree with speech evaluation.  No neuro deficits.  Marty Heck, MD [facility] [place] Office: [phone] Pager: [phone]

## 2018-01-06 NOTE — Progress Notes (Addendum)
TCTS DAILY ICU PROGRESS NOTE                   [address]            [place] [address]          [phone]   3 Days Post-Op Procedure(s) (LRB): AORTIC VALVE REPLACEMENT (AVR) using 53mm Magna Ease Bioprosthesis Aortic Valve (N/A) CORONARY ARTERY BYPASS GRAFTING (CABG) x 4 (LIMA to LAD, SVG to DIAGONAL, SVG to RAMUS INTERMEDIATE, and SVG to PDA), USING LEFT INFTERNAL MAMMARY ARTERY AND (N/A) TRANSESOPHAGEAL ECHOCARDIOGRAM (TEE) (N/A) ENDARTERECTOMY CAROTID (Right)  Total Length of Stay:  LOS: 3 days   Subjective: Patient sitting in chair. Has cough and still somewhat hoarse. He already walked this am. Passing flatus but no bowel movement yet.  Objective: Vital signs in last 24 hours: Temp:  [97.5 F (36.4 C)-97.7 F (36.5 C)] 97.7 F (36.5 C) (12/19 0000) Pulse Rate:  [54-118] 87 (12/19 0500) Cardiac Rhythm: Atrial fibrillation (12/19 0400) Resp:  [11-28] 18 (12/19 0500) BP: (80-152)/(47-136) 127/65 (12/19 0500) SpO2:  [89 %-98 %] 98 % (12/19 0500)  Filed Weights   01/04/18 0500 01/05/18 0500  Weight: 94 kg 96.6 kg    Weight change:       Intake/Output from previous day: 12/18 0701 - 12/19 0700 In: 789.5 [P.O.:120; I.V.:494.9; IV Piggyback:174.6] Out: 2517 [Urine:2035; Drains:2; Chest Tube:480]  Intake/Output this shift: No intake/output data recorded.  Current Meds: Scheduled Meds: . acetaminophen  1,000 mg Oral Q6H   Or  . acetaminophen (TYLENOL) oral liquid 160 mg/5 mL  1,000 mg Per Tube Q6H  . amiodarone  400 mg Oral BID  . aspirin EC  325 mg Oral Daily   Or  . aspirin  324 mg Per Tube Daily  . atorvastatin  20 mg Oral q1800  . bisacodyl  10 mg Oral Daily   Or  . bisacodyl  10 mg Rectal Daily  . Chlorhexidine Gluconate Cloth  6 each Topical Daily  . Chlorhexidine Gluconate Cloth  6 each Topical Daily  . docusate sodium  200 mg Oral Daily  . enoxaparin (LOVENOX) injection  40 mg Subcutaneous Q24H  . finasteride  5 mg Oral Daily    . fluticasone  2 spray Each Nare Daily  . insulin aspart  0-24 Units Subcutaneous Q4H  . insulin glargine  14 Units Subcutaneous BID  . levalbuterol  0.63 mg Nebulization TID  . loratadine  10 mg Oral Daily  . mouth rinse  15 mL Mouth Rinse BID  . metoCLOPramide (REGLAN) injection  10 mg Intravenous Q6H  . metoprolol tartrate  12.5 mg Oral BID  . mupirocin ointment  1 application Nasal BID  . nefazodone  50 mg Oral BID  . pantoprazole  40 mg Oral Daily  . pregabalin  50 mg Oral TID  . sodium chloride flush  10-40 mL Intracatheter Q12H  . sodium chloride flush  3 mL Intravenous Q12H   Continuous Infusions: . sodium chloride Stopped (01/04/18 1000)  . sodium chloride 250 mL (01/04/18 0617)  . sodium chloride Stopped (01/04/18 1100)  . DOPamine 2.5 mcg/kg/min (01/06/18 0500)  . furosemide (LASIX) infusion 10 mg/hr (01/06/18 0500)  . lactated ringers    . magnesium sulfate    . milrinone 0.125 mcg/kg/min (01/06/18 0500)  . potassium chloride 50 mL/hr at 01/06/18 0500   PRN Meds:.sodium chloride, metoprolol tartrate, midazolam, ondansetron (ZOFRAN) IV, potassium chloride, sodium chloride flush, sodium chloride flush, traMADol, zolpidem  General appearance: alert, cooperative and no distress Neurologic: intact Heart: IRRR IRRR, no murmur Lungs: diminished breath sounds bibasilar  Abdomen: Soft, non tender, sporadic bowel sounds present Extremities: Mild bilateral LE edema Wound: Aquacel intact. RLE dressings clean and dry   Lab Results: CBC: Recent Labs    01/05/18 0354 01/06/18 0318  WBC 37.3* 41.8*  HGB 9.2* 8.5*  HCT 27.7* 25.9*  PLT 220 233   BMET:  Recent Labs    01/05/18 1807 01/06/18 0318  NA 132* 132*  K 3.5 3.3*  CL 97* 96*  CO2 25 27  GLUCOSE 173* 116*  BUN 15 15  CREATININE 0.89 0.81  CALCIUM 7.9* 7.9*    CMET: Lab Results  Component Value Date   WBC 41.8 (H) 01/06/2018   HGB 8.5 (L) 01/06/2018   HCT 25.9 (L) 01/06/2018   PLT 233 01/06/2018    GLUCOSE 116 (H) 01/06/2018   ALT 24 01/06/2018   AST 48 (H) 01/06/2018   NA 132 (L) 01/06/2018   K 3.3 (L) 01/06/2018   CL 96 (L) 01/06/2018   CREATININE 0.81 01/06/2018   BUN 15 01/06/2018   CO2 27 01/06/2018   INR 1.00 01/03/2018   HGBA1C 5.9 (H) 12/30/2017      PT/INR:  Recent Labs    01/03/18 1913  LABPROT 13.1  INR 1.00   Radiology: Dg Chest Port 1 View  Result Date: 01/05/2018 CLINICAL DATA:  Peripherally inserted catheter EXAM: PORTABLE CHEST 1 VIEW COMPARISON:  Earlier today FINDINGS: Right upper extremity PICC with tip at the upper SVC. Left IJ sheath in stable position. Chest tubes in stable position. There is artifact from EKG leads. Unchanged low lung volumes with mild elevation of the left diaphragm. There is atelectatic opacity, vascular congestion, and trace effusions. IMPRESSION: 1. New PICC with tip at the upper SVC. 2. Improved pulmonary edema. Electronically Signed   By: Monte Fantasia M.D.   On: 01/05/2018 10:33     Assessment/Plan: S/P Procedure(s) (LRB): AORTIC VALVE REPLACEMENT (AVR) using 51mm Magna Ease Bioprosthesis Aortic Valve (N/A) CORONARY ARTERY BYPASS GRAFTING (CABG) x 4 (LIMA to LAD, SVG to DIAGONAL, SVG to RAMUS INTERMEDIATE, and SVG to PDA), USING LEFT INFTERNAL MAMMARY ARTERY AND (N/A) TRANSESOPHAGEAL ECHOCARDIOGRAM (TEE) (N/A) ENDARTERECTOMY CAROTID (Right)   1. CV-Remains in a fib post op with HR up to 110's. On Milrinone and  Dopamine drips. Await co ox result this am.  Also, on Lopressor 12.5 mg bid and Amiodarone 400 mg bid.  2. Pulmonary-On 2 liters of oxygen via [place]. Will wean as able. Chest tubes with 580 cc last 24 hours. CXR this am shows no pneumothorax, worsening pulmonary vascular congestion, and small pleural effusions. Per Dr. Prescott Gum, mediastinal chest tubes are to be removed but pleural chest tube to remain. Encourage incentive spirometer and flutter valve. Mucinex bid. 3. Volume overload-on Lasix drip and to be given  Metolazone this am. Foley remains for now as need accurate in and outs 4. ABL anemia-H and H slightly decreased this am to 8.5 and 25.9 sleeve 5. DM-CBGs 132/94/79. On Insulin but will decrease to avoid hypoglycemia. Will restart Metformin XR once tolerating oral better and as long as creatinine remains normal. Pre op HGA1C 5.9 6. Will remove EPW in am  Nani Skillern PA-C 01/06/2018 7:17 AM

## 2018-01-06 NOTE — Progress Notes (Signed)
Both mediastinal chest tubes removed. 1 pleural chest tube remaining. VSS, lung sounds equal bilaterally. Pt resting comfortably in bed. Will continue to monitor closely.  Albertina Senegal, RN

## 2018-01-06 NOTE — Evaluation (Addendum)
Clinical/Bedside Swallow Evaluation Patient Details  Name: Justin [name] MRN: [id number] Date of Birth: March 23, 1955  Today's Date: 01/06/2018 Time: SLP Start Time (ACUTE ONLY): 0907 SLP Stop Time (ACUTE ONLY): 0930 SLP Time Calculation (min) (ACUTE ONLY): 23 min  Past Medical History:  Past Medical History:  Diagnosis Date  . Acute meniscal tear of knee LEFT  . Aortic stenosis 12/01/2017   NONRHEUMATIC, AORTIC VALVE CALCIFICATIONS, MILD TO MODERATE REGURG, MILD TO MODERATE CALCIFIED ANNULUS per ECHO 10/25/17 @ [facility]  . Arthritis   . Atrial fibrillation ([facility]) 11/12/2017   AT O/V WITH PCP  . DM (diabetes mellitus) ([place])   . Heart murmur MILD-- ASYMPTOMATIC  . Hyperlipidemia   . Hypertension   . Left knee pain   . PAD (peripheral artery disease) ([facility])    left leg claudication   Past Surgical History:  Past Surgical History:  Procedure Laterality Date  . AORTIC VALVE REPLACEMENT N/A 01/03/2018   Procedure: AORTIC VALVE REPLACEMENT (AVR) using 96mm Magna Ease Bioprosthesis Aortic Valve;  Surgeon: Ivin Poot, MD;  Location: [facility];  Service: Open Heart Surgery;  Laterality: N/A;  . APPENDECTOMY  1998  . CATARACT EXTRACTION W/ INTRAOCULAR LENS  IMPLANT, BILATERAL  1998/  2000  . CERVICAL FUSION  1985   C4 - 5  . CORONARY ARTERY BYPASS GRAFT N/A 01/03/2018   Procedure: CORONARY ARTERY BYPASS GRAFTING (CABG) x 4 (LIMA to LAD, SVG to DIAGONAL, SVG to RAMUS INTERMEDIATE, and SVG to PDA), USING LEFT INFTERNAL MAMMARY ARTERY AND;  Surgeon: Prescott Gum, Collier Salina, MD;  Location: [facility];  Service: Open Heart Surgery;  Laterality: N/A;  . ENDARTERECTOMY Right 01/03/2018   Procedure: ENDARTERECTOMY CAROTID;  Surgeon: Marty Heck, MD;  Location: [facility];  Service: Vascular;  Laterality: Right;  . KNEE ARTHROSCOPY W/ MENISCECTOMY  1991   LEFT KNEE  . NASAL SINUS SURGERY  1982  . RIGHT/LEFT HEART CATH AND CORONARY ANGIOGRAPHY N/A 12/24/2017   Procedure: RIGHT/LEFT HEART  CATH AND CORONARY ANGIOGRAPHY;  Surgeon: Jolaine Artist, MD;  Location: [facility];  Service: Cardiovascular;  Laterality: N/A;  . ROTATOR CUFF REPAIR  09-04-2005   LEFT SHOULDER  . TEE WITHOUT CARDIOVERSION N/A 12/24/2017   Procedure: TRANSESOPHAGEAL ECHOCARDIOGRAM (TEE);  Surgeon: Jolaine Artist, MD;  Location: [facility] ENDOSCOPY;  Service: Cardiovascular;  Laterality: N/A;  . TEE WITHOUT CARDIOVERSION N/A 01/03/2018   Procedure: TRANSESOPHAGEAL ECHOCARDIOGRAM (TEE);  Surgeon: Prescott Gum, Collier Salina, MD;  Location: [facility];  Service: Open Heart Surgery;  Laterality: N/A;  . TONSILLECTOMY  AGE 49   HPI:  Pt adm for CABG x 4 and AVR and simultaneous rt carotid endarterectomy on 12/16. PMH - CAD, afib, PAD, arthritis, HTN, cervical fusion, DM. CXR Slight interval worsening in pulmonary edema pattern. Small bilateral effusions.   Assessment / Plan / Recommendation Clinical Impression  Pt demonstrated indications of possible glottal intrusion with thin that appears acute and reversible following CEA. Suspect likely edema may be interfering with laryngeal protection more prominent with thin and pt reports coughing with thin liquids yesterday. He does not complain of pharyngeal globus sensation with solids. Mildly thickened liquids may decreased rate of liquids through pharynx and increase airway protection without resulting in excessive residue, therefore recommend short term nectar thick liquids, Dys 3 with multiple swallows, no straws and pills whole in applesauce. Requested pt ask wife to bring partial plate to hospital. ST will follow for safety and upgrade with liquids/solids. Pt may need instrumental assessment  given intubation, CABG and carotid endarterectomy surgery SLP Visit Diagnosis: Dysphagia, unspecified (R13.10)    Aspiration Risk  Mild aspiration risk;Moderate aspiration risk    Diet Recommendation Dysphagia 3 (Mech soft);Nectar-thick liquid   Liquid Administration via: Cup;No  straw Medication Administration: Whole meds with puree Supervision: Patient able to self feed;Intermittent supervision to cue for compensatory strategies Compensations: Slow rate;Small sips/bites Postural Changes: Seated upright at 90 degrees    Other  Recommendations Oral Care Recommendations: Oral care BID   Follow up Recommendations None      Frequency and Duration min 2x/week  2 weeks       Prognosis Prognosis for Safe Diet Advancement: Good      Swallow Study   General HPI: Pt adm for CABG x 4 and AVR and simultaneous rt carotid endarterectomy on 12/16. PMH - CAD, afib, PAD, arthritis, HTN, cervical fusion, DM. CXR Slight interval worsening in pulmonary edema pattern. Small bilateral effusions. Type of Study: Bedside Swallow Evaluation Previous Swallow Assessment: (none) Diet Prior to this Study: Thin liquids(clears) Temperature Spikes Noted: No Respiratory Status: Room air History of Recent Intubation: Yes Length of Intubations (days): 2 days Date extubated: 01/04/18 Behavior/Cognition: Alert;Cooperative;Pleasant mood;Requires cueing Oral Cavity Assessment: Within Functional Limits Oral Care Completed by SLP: No Oral Cavity - Dentition: (missing partials) Vision: Functional for self-feeding Self-Feeding Abilities: Able to feed self Patient Positioning: Upright in chair Baseline Vocal Quality: Normal Volitional Cough: Strong Volitional Swallow: Able to elicit    Oral/Motor/Sensory Function Overall Oral Motor/Sensory Function: Within functional limits   Ice Chips Ice chips: Not tested   Thin Liquid Thin Liquid: Impaired Presentation: Cup;Straw Pharyngeal  Phase Impairments: Cough - Delayed;Throat Clearing - Delayed    Nectar Thick Nectar Thick Liquid: Not tested   Honey Thick Honey Thick Liquid: Not tested   Puree Puree: Within functional limits   Solid     Solid: Not tested      Houston Siren 01/06/2018,11:01 AM  Orbie Pyo Colvin Caroli.Ed  Risk analyst [phone] Office [phone]

## 2018-01-07 ENCOUNTER — Inpatient Hospital Stay (HOSPITAL_COMMUNITY): Payer: BLUE CROSS/BLUE SHIELD

## 2018-01-07 DIAGNOSIS — I480 Paroxysmal atrial fibrillation: Secondary | ICD-10-CM

## 2018-01-07 LAB — BPAM RBC
Blood Product Expiration Date: 202001042359
Blood Product Expiration Date: 202001052359
Blood Product Expiration Date: 202001062359
Blood Product Expiration Date: 202001102359
ISSUE DATE / TIME: 201912161102
ISSUE DATE / TIME: 201912161102
ISSUE DATE / TIME: 201912161102
ISSUE DATE / TIME: 201912161102
Unit Type and Rh: 6200
Unit Type and Rh: 6200
Unit Type and Rh: 6200
Unit Type and Rh: 6200

## 2018-01-07 LAB — COOXEMETRY PANEL
Carboxyhemoglobin: 2.4 % — ABNORMAL HIGH (ref 0.5–1.5)
Methemoglobin: 1.8 % — ABNORMAL HIGH (ref 0.0–1.5)
O2 Saturation: 93.8 %
Total hemoglobin: 9.6 g/dL — ABNORMAL LOW (ref 12.0–16.0)

## 2018-01-07 LAB — HEPATIC FUNCTION PANEL
ALT: 23 U/L (ref 0–44)
AST: 39 U/L (ref 15–41)
Albumin: 2.4 g/dL — ABNORMAL LOW (ref 3.5–5.0)
Alkaline Phosphatase: 67 U/L (ref 38–126)
Bilirubin, Direct: 0.5 mg/dL — ABNORMAL HIGH (ref 0.0–0.2)
Indirect Bilirubin: 1.1 mg/dL — ABNORMAL HIGH (ref 0.3–0.9)
Total Bilirubin: 1.6 mg/dL — ABNORMAL HIGH (ref 0.3–1.2)
Total Protein: 5.2 g/dL — ABNORMAL LOW (ref 6.5–8.1)

## 2018-01-07 LAB — TYPE AND SCREEN
ABO/RH(D): A POS
Antibody Screen: NEGATIVE
Unit division: 0
Unit division: 0
Unit division: 0
Unit division: 0

## 2018-01-07 LAB — GLUCOSE, CAPILLARY
Glucose-Capillary: 112 mg/dL — ABNORMAL HIGH (ref 70–99)
Glucose-Capillary: 71 mg/dL (ref 70–99)
Glucose-Capillary: 110 mg/dL — ABNORMAL HIGH (ref 70–99)
Glucose-Capillary: 107 mg/dL — ABNORMAL HIGH (ref 70–99)

## 2018-01-07 LAB — BASIC METABOLIC PANEL
Anion gap: 13 (ref 5–15)
BUN: 15 mg/dL (ref 8–23)
CO2: 31 mmol/L (ref 22–32)
Calcium: 8.1 mg/dL — ABNORMAL LOW (ref 8.9–10.3)
Chloride: 87 mmol/L — ABNORMAL LOW (ref 98–111)
Creatinine, Ser: 0.82 mg/dL (ref 0.61–1.24)
GFR calc Af Amer: >60 mL/min (ref >60)
GFR calc non Af Amer: >60 mL/min (ref >60)
Glucose, Bld: 199 mg/dL — ABNORMAL HIGH (ref 70–99)
Potassium: 2.6 mmol/L — CL (ref 3.5–5.1)
Sodium: 131 mmol/L — ABNORMAL LOW (ref 135–145)

## 2018-01-07 LAB — CBC
HCT: 28.4 % — ABNORMAL LOW (ref 39.0–52.0)
Hemoglobin: 9.4 g/dL — ABNORMAL LOW (ref 13.0–17.0)
MCH: 28.7 pg (ref 26.0–34.0)
MCHC: 33.1 g/dL (ref 30.0–36.0)
MCV: 86.9 fL (ref 80.0–100.0)
Platelets: 264 10*3/uL (ref 150–400)
RBC: 3.27 MIL/uL — ABNORMAL LOW (ref 4.22–5.81)
RDW: 28.4 % — ABNORMAL HIGH (ref 11.5–15.5)
WBC: 42.5 10*3/uL — ABNORMAL HIGH (ref 4.0–10.5)
nRBC: 1 % — ABNORMAL HIGH (ref 0.0–0.2)

## 2018-01-07 MED ORDER — POTASSIUM CHLORIDE 10 MEQ/50ML IV SOLN
10.0000 meq | INTRAVENOUS | Status: DC | PRN
  Administered 2018-01-07 (×2): 10 meq via INTRAVENOUS
  Filled 2018-01-07 (×3): qty 50

## 2018-01-07 MED ORDER — POTASSIUM CHLORIDE 10 MEQ/50ML IV SOLN
10.0000 meq | INTRAVENOUS | Status: AC
  Administered 2018-01-07 (×2): 10 meq via INTRAVENOUS
  Filled 2018-01-07 (×2): qty 50

## 2018-01-07 MED ORDER — INSULIN GLARGINE 100 UNIT/ML ~~LOC~~ SOLN
12.0000 [IU] | Freq: Two times a day (BID) | SUBCUTANEOUS | Status: DC
  Administered 2018-01-07 – 2018-01-10 (×6): 12 [IU] via SUBCUTANEOUS
  Filled 2018-01-07 (×9): qty 0.12

## 2018-01-07 MED ORDER — FUROSEMIDE 10 MG/ML IJ SOLN
40.0000 mg | Freq: Every day | INTRAMUSCULAR | Status: DC
  Administered 2018-01-08 – 2018-01-09 (×2): 40 mg via INTRAVENOUS
  Filled 2018-01-07 (×2): qty 4

## 2018-01-07 MED ORDER — METOLAZONE 5 MG PO TABS
5.0000 mg | ORAL_TABLET | Freq: Once | ORAL | Status: AC
  Administered 2018-01-07: 5 mg via ORAL
  Filled 2018-01-07: qty 1

## 2018-01-07 MED ORDER — AMIODARONE HCL 200 MG PO TABS
200.0000 mg | ORAL_TABLET | Freq: Two times a day (BID) | ORAL | Status: DC
  Administered 2018-01-07 – 2018-01-10 (×7): 200 mg via ORAL
  Filled 2018-01-07 (×7): qty 1

## 2018-01-07 MED ORDER — POTASSIUM CHLORIDE CRYS ER 20 MEQ PO TBCR
40.0000 meq | EXTENDED_RELEASE_TABLET | Freq: Every day | ORAL | Status: DC
  Administered 2018-01-07 – 2018-01-10 (×4): 40 meq via ORAL
  Filled 2018-01-07 (×4): qty 2

## 2018-01-07 NOTE — Progress Notes (Signed)
R neck site where transducer had been discontinued, covered by bandaid and bleeding. RN who transferred stated MD advised to not cover with gauze due to closeness to incision.  Band aid saturated, vaseline gauze dressing applied, pressure held until bleeding stopped.  Meanwhile, pt BPs dropped to 80/50s while pt up in recliner.  Pt assisted to bed, very lethargic, but A&Ox4, neuro intact.  BPs back up to baseline prior to transfer.  Will continue to monitor.

## 2018-01-07 NOTE — Progress Notes (Signed)
Upon med pass, pt quite difficult to arouse.  Vital signs stable and neurologically intact, however he had difficulty bringing med cup to his mouth.  RN from previous unit came to assess, as this was a change from pt's baseline as per handoff.  Pt remains neurologically intact.  But she said that he had been up most of the day and pt states he tends to sleep quite hard due to the hours he keeps on his job.

## 2018-01-07 NOTE — Progress Notes (Signed)
4E nurse called to unit due to pt change of status and asked if pt previous nurse could come back to floor and re-evaluate pt status. Upon arrival, pt 4 prior blood pressures noted to have systolic blood pressure in mid 110s and MAP of 70-80s. Neuro assessment preformed, pt A/Ox4, NIHSS assessment preformed with appropriate responses noted, symmetrical facial structure, and no drift to extremities. Pt stated he was tired and just wanted to rest. Carotid Endarterectomy site assessed with pressure dressing noted (no current bleeding). Informed nurse Dr. Prescott Gum removed previous dressing and requested incision to be left open to air and drain site to "just place a bandaid". Site noted to previous have slight drainage still. Site was assessed later in day and clot had form, stopping drainage.

## 2018-01-07 NOTE — Progress Notes (Signed)
Pt. Transferred to 4E room 12. Report given to Edison Nasuti RN. Pt. AOX4. VSS.

## 2018-01-07 NOTE — Plan of Care (Signed)
  Problem: Education: Goal: Knowledge of General Education information will improve Description Including pain rating scale, medication(s)/side effects and non-pharmacologic comfort measures Outcome: Progressing   Problem: Clinical Measurements: Goal: Ability to maintain clinical measurements within normal limits will improve Outcome: Progressing Goal: Will remain free from infection Outcome: Progressing Goal: Diagnostic test results will improve Outcome: Progressing Goal: Respiratory complications will improve Outcome: Progressing Goal: Cardiovascular complication will be avoided Outcome: Progressing   Problem: Activity: Goal: Risk for activity intolerance will decrease Outcome: Progressing   Problem: Nutrition: Goal: Adequate nutrition will be maintained Outcome: Progressing   Problem: Coping: Goal: Level of anxiety will decrease Outcome: Progressing   Problem: Elimination: Goal: Will not experience complications related to urinary retention Outcome: Progressing   Problem: Safety: Goal: Ability to remain free from injury will improve Outcome: Progressing   Problem: Education: Goal: Will demonstrate proper wound care and an understanding of methods to prevent future damage Outcome: Progressing Goal: Knowledge of disease or condition will improve Outcome: Progressing   Problem: Cardiac: Goal: Will achieve and/or maintain hemodynamic stability Outcome: Progressing   Problem: Clinical Measurements: Goal: Postoperative complications will be avoided or minimized Outcome: Progressing   Problem: Respiratory: Goal: Respiratory status will improve Outcome: Progressing

## 2018-01-07 NOTE — Progress Notes (Addendum)
TCTS DAILY ICU PROGRESS NOTE                   [address]            [place] [address]          [phone]   4 Days Post-Op Procedure(s) (LRB): AORTIC VALVE REPLACEMENT (AVR) using 38mm Magna Ease Bioprosthesis Aortic Valve (N/A) CORONARY ARTERY BYPASS GRAFTING (CABG) x 4 (LIMA to LAD, SVG to DIAGONAL, SVG to RAMUS INTERMEDIATE, and SVG to PDA), USING LEFT INFTERNAL MAMMARY ARTERY AND (N/A) TRANSESOPHAGEAL ECHOCARDIOGRAM (TEE) (N/A) ENDARTERECTOMY CAROTID (Right)  Total Length of Stay:  LOS: 4 days   Subjective: Awake and alert, up in bedside chair.  Complains of some nausea but no vomiting.  BM since surgery.  Walked a full lap this morning. Denies shortness of breath on RA.  Objective: Vital signs in last 24 hours: Temp:  [97.8 F (36.6 C)-98.8 F (37.1 C)] 98.6 F (37 C) (12/20 0740) Pulse Rate:  [75-95] 93 (12/20 0700) Cardiac Rhythm: Atrial fibrillation (12/20 0000) Resp:  [13-26] 13 (12/20 0700) BP: (65-114)/(47-97) 97/57 (12/20 0700) SpO2:  [90 %-98 %] 97 % (12/20 0700)  Filed Weights   01/04/18 0500 01/05/18 0500 01/06/18 0500  Weight: 94 kg 96.6 kg 96.7 kg      Intake/Output from previous day: 12/19 0701 - 12/20 0700 In: 1013.3 [P.O.:360; I.V.:376.2; IV Piggyback:277] Out: 6105 [Urine:5975]   Current Meds: Scheduled Meds: . acetaminophen  1,000 mg Oral Q6H   Or  . acetaminophen (TYLENOL) oral liquid 160 mg/5 mL  1,000 mg Per Tube Q6H  . amiodarone  200 mg Oral BID  . aspirin EC  325 mg Oral Daily   Or  . aspirin  324 mg Per Tube Daily  . atorvastatin  20 mg Oral q1800  . bisacodyl  10 mg Oral Daily   Or  . bisacodyl  10 mg Rectal Daily  . Chlorhexidine Gluconate Cloth  6 each Topical Daily  . Chlorhexidine Gluconate Cloth  6 each Topical Daily  . docusate sodium  200 mg Oral Daily  . enoxaparin (LOVENOX) injection  40 mg Subcutaneous Q24H  . ferrous [id number]-OINOMVE C-folic acid  1 capsule Oral Q breakfast  . finasteride   5 mg Oral Daily  . fluticasone  2 spray Each Nare Daily  . [START ON 01/08/2018] furosemide  40 mg Intravenous Daily  . guaiFENesin  600 mg Oral BID  . insulin aspart  0-24 Units Subcutaneous TID AC & HS  . insulin glargine  12 Units Subcutaneous BID  . levalbuterol  0.63 mg Nebulization TID  . loratadine  10 mg Oral Daily  . mouth rinse  15 mL Mouth Rinse BID  . metoCLOPramide (REGLAN) injection  10 mg Intravenous Q6H  . metolazone  5 mg Oral Once  . metoprolol tartrate  12.5 mg Oral BID  . mupirocin ointment  1 application Nasal BID  . nefazodone  50 mg Oral BID  . pantoprazole  40 mg Oral Daily  . potassium chloride  40 mEq Oral Daily  . pregabalin  50 mg Oral TID  . sodium chloride flush  10-40 mL Intracatheter Q12H  . sodium chloride flush  3 mL Intravenous Q12H   Continuous Infusions: . sodium chloride Stopped (01/04/18 1000)  . sodium chloride 250 mL (01/04/18 0617)  . sodium chloride Stopped (01/04/18 1100)  . lactated ringers    . magnesium sulfate    . potassium chloride 10  mEq (01/07/18 0703)  . potassium chloride     PRN Meds:.sodium chloride, levalbuterol, metoprolol tartrate, ondansetron (ZOFRAN) IV, potassium chloride, RESOURCE THICKENUP CLEAR, sodium chloride flush, sodium chloride flush, traMADol, zolpidem  General appearance: alert, no distress. No dyspnea on RA, O2 sat 96%  Neck: Right neck incision dry and intact, no significant hematoma.   Heart: soft systolic murmur expected after prosthetic tissue AVR.  Irregularly irregular rhythm,  monitor show atrial fibrillation with CVR.  Chest: Breath sounds clear, shallow.  Sternotomy ncision is intact and dry.  Abd: soft and non-tender  Exts.: Trace LE edema. EVH incisions intact with no evidence of complication.  Neuro: Grossly intact    Lab Results: CBC: Recent Labs    01/06/18 0318 01/06/18 1607 01/07/18 0404  WBC 41.8*  --  42.5*  HGB 8.5* 9.5* 9.4*  HCT 25.9* 28.0* 28.4*  PLT 233  --  264    BMET:  Recent Labs    01/06/18 0318 01/06/18 1607 01/07/18 0404  NA 132* 131* 131*  K 3.3* 3.0* 2.6*  CL 96* 90* 87*  CO2 27  --  31  GLUCOSE 116* 111* 199*  BUN 15 15 15   CREATININE 0.81 0.70 0.82  CALCIUM 7.9*  --  8.1*    CMET: Lab Results  Component Value Date   WBC 42.5 (H) 01/07/2018   HGB 9.4 (L) 01/07/2018   HCT 28.4 (L) 01/07/2018   PLT 264 01/07/2018   GLUCOSE 199 (H) 01/07/2018   ALT 23 01/07/2018   AST 39 01/07/2018   NA 131 (L) 01/07/2018   K 2.6 (LL) 01/07/2018   CL 87 (L) 01/07/2018   CREATININE 0.82 01/07/2018   BUN 15 01/07/2018   CO2 31 01/07/2018   INR 1.00 01/03/2018   HGBA1C 5.9 (H) 12/30/2017      PT/INR: No results for input(s): LABPROT, INR in the last 72 hours. Radiology: Dg Chest Port 1 View  Result Date: 01/07/2018 CLINICAL DATA:  Score chest.  CABG EXAM: PORTABLE CHEST 1 VIEW COMPARISON:  01/06/2017 FINDINGS: Prior median sternotomy, CABG and valve replacement. Cardiomegaly. Left chest tube and right PICC line remain in place, unchanged. Bibasilar atelectasis. Small bilateral effusions. No pneumothorax. IMPRESSION: Postoperative changes. Left chest tube remains in place without pneumothorax. Bibasilar atelectasis and small effusions. Electronically Signed   By: Rolm Baptise M.D.   On: 01/07/2018 07:15     Assessment/Plan:  -POD4 CABGx4, Tissue AVE, and right CEA Hemodynamically stable off milrinone. Plan to remove remaining left chest tube and foley catheter and transfer to intermediate care.  Continue PT  Chronic atrial fibrillation:  V-rate remains controlled.  Continue amiodarone and metoprolol, correct K+.  Currently anticoagulating with Lovenox SQ q24h.  Plan to resume NOAC at discharge.  Volume overload-Responded well to Laix / renal DA yesterday with net diuresis in excess of 5L      Antony Odea, PA-C 01/07/2018 7:47 AM   Looks better after diuresis Move to stepdown, remove EPWs tomorrow- resume preop  eliquisat discharge C/o nausea- freduce amioidarone to 200 mg  patient examined and medical record reviewed,agree with above note. Tharon Aquas Trigt III 01/07/2018

## 2018-01-07 NOTE — Progress Notes (Signed)
  Speech Language Pathology Treatment: Dysphagia  Patient Details Name: Justin [name] MRN: [id number] DOB: 04-Dec-1955 Today's Date: 01/07/2018 Time: 7209-4709 SLP Time Calculation (min) (ACUTE ONLY): 21 min  Assessment / Plan / Recommendation Clinical Impression  Vocal quality has improved; no audible pharyngeal congestion at baseline. Pt's wife present and with pt's permission reviewed results, recommendations and treatment plan re: swallow function.Trials of thin water and juice swallowed without throat clears, coughs or wet vocal quality which were present yesterday during initial assessment. Multiple trials thin consumed via cup. Pt brushed oral cavity and donned partials, cleaned by ST assisting in masticating regular texture without difficulty. Reiterated pt's risk factors for dysphagia including CABG, CEA and recent intubation and need for adherence to precautions with recommended upgrade to thin liquids and regular texture. Pt/wife verbalized comprehension (wife if retired ED Therapist, sports). No further ST needed at present.    HPI HPI: Pt adm for CABG x 4 and AVR and simultaneous rt carotid endarterectomy on 12/16. PMH - CAD, afib, PAD, arthritis, HTN, cervical fusion, DM. CXR Slight interval worsening in pulmonary edema pattern. Small bilateral effusions.      SLP Plan  Continue with current plan of care       Recommendations  Diet recommendations: Regular;Thin liquid Liquids provided via: Cup;No straw Medication Administration: Whole meds with puree Supervision: Patient able to self feed;Intermittent supervision to cue for compensatory strategies Compensations: Slow rate;Small sips/bites Postural Changes and/or Swallow Maneuvers: Seated upright 90 degrees                Oral Care Recommendations: Oral care BID Follow up Recommendations: None SLP Visit Diagnosis: Dysphagia, unspecified (R13.10) Plan: Continue with current plan of care       GO                Houston Siren 01/07/2018, 3:32 PM   Orbie Pyo Chezney Huether M.Ed Risk analyst [phone] Office [phone]

## 2018-01-07 NOTE — Progress Notes (Signed)
2 Liters O2 via nasal cannula was administered to patient to maintain O2 sats above 92%.

## 2018-01-07 NOTE — Progress Notes (Signed)
Progress Note  Patient Name: Justin [name] Date of Encounter: 01/07/2018  Primary Cardiologist: Buford Dresser, MD   Subjective   Having some nausea this AM, but overall doing ok. Working on deep breathing, as this still causes some discomfort. Hoarseness in voice is improved. Has done well off IV support meds, diuresed well.  Inpatient Medications    Scheduled Meds: . acetaminophen  1,000 mg Oral Q6H   Or  . acetaminophen (TYLENOL) oral liquid 160 mg/5 mL  1,000 mg Per Tube Q6H  . amiodarone  200 mg Oral BID  . aspirin EC  325 mg Oral Daily   Or  . aspirin  324 mg Per Tube Daily  . atorvastatin  20 mg Oral q1800  . bisacodyl  10 mg Oral Daily   Or  . bisacodyl  10 mg Rectal Daily  . Chlorhexidine Gluconate Cloth  6 each Topical Daily  . Chlorhexidine Gluconate Cloth  6 each Topical Daily  . docusate sodium  200 mg Oral Daily  . enoxaparin (LOVENOX) injection  40 mg Subcutaneous Q24H  . ferrous [id number] C-folic acid  1 capsule Oral Q breakfast  . finasteride  5 mg Oral Daily  . fluticasone  2 spray Each Nare Daily  . [START ON 01/08/2018] furosemide  40 mg Intravenous Daily  . guaiFENesin  600 mg Oral BID  . insulin aspart  0-24 Units Subcutaneous TID AC & HS  . insulin glargine  12 Units Subcutaneous BID  . levalbuterol  0.63 mg Nebulization TID  . loratadine  10 mg Oral Daily  . mouth rinse  15 mL Mouth Rinse BID  . metoCLOPramide (REGLAN) injection  10 mg Intravenous Q6H  . metoprolol tartrate  12.5 mg Oral BID  . mupirocin ointment  1 application Nasal BID  . nefazodone  50 mg Oral BID  . pantoprazole  40 mg Oral Daily  . potassium chloride  40 mEq Oral Daily  . pregabalin  50 mg Oral TID  . sodium chloride flush  10-40 mL Intracatheter Q12H  . sodium chloride flush  3 mL Intravenous Q12H   Continuous Infusions: . sodium chloride Stopped (01/04/18 1000)  . sodium chloride 250 mL (01/04/18 0617)  . sodium chloride Stopped (01/04/18  1100)  . lactated ringers    . magnesium sulfate    . potassium chloride 10 mEq (01/07/18 0703)  . potassium chloride 10 mEq (01/07/18 0840)   PRN Meds: sodium chloride, levalbuterol, metoprolol tartrate, ondansetron (ZOFRAN) IV, potassium chloride, RESOURCE THICKENUP CLEAR, sodium chloride flush, sodium chloride flush, traMADol, zolpidem   Vital Signs    Vitals:   01/07/18 0600 01/07/18 0700 01/07/18 0740 01/07/18 0800  BP: 93/75 (!) 97/57  113/88  Pulse: 93 93  79  Resp: (!) 21 13  (!) 25  Temp:   98.6 F (37 C)   TempSrc:   Oral   SpO2: 93% 97%  98%  Weight:      Height:        Intake/Output Summary (Last 24 hours) at 01/07/2018 0925 Last data filed at 01/07/2018 0700 Gross per 24 hour  Intake 743.6 ml  Output 5555 ml  Net -4811.4 ml   Filed Weights   01/04/18 0500 01/05/18 0500 01/06/18 0500  Weight: 94 kg 96.6 kg 96.7 kg    Telemetry    Atrial fib- Personally Reviewed  ECG    No new since 12/17 - Personally Reviewed  Physical Exam   GEN: No acute distress.   Neck:  No JVD. R CEA site c/d/i Cardiac: irregularly irregular S1 and S2, 1/6 SEM, no rubs, or gallops.  Respiratory: shallow, clear in upper fields, mildly diminished in lower fields GI: Soft, nontender, non-distended  MS: trace LE bilateral edema; No deformity. Neuro:  Nonfocal  Psych: Normal affect   Labs    Chemistry Recent Labs  Lab 01/05/18 0354 01/05/18 1807 01/06/18 0318 01/06/18 1607 01/07/18 0404  NA 133* 132* 132* 131* 131*  K 4.0 3.5 3.3* 3.0* 2.6*  CL 99 97* 96* 90* 87*  CO2 24 25 27   --  31  GLUCOSE 141* 173* 116* 111* 199*  BUN 13 15 15 15 15   CREATININE 0.97 0.89 0.81 0.70 0.82  CALCIUM 8.0* 7.9* 7.9*  --  8.1*  PROT 4.8*  --  4.6*  --  5.2*  ALBUMIN 2.5*  --  2.3*  --  2.4*  AST 82*  --  48*  --  39  ALT 26  --  24  --  23  ALKPHOS 54  --  57  --  67  BILITOT 1.1  --  1.4*  --  1.6*  GFRNONAA >60 >60 >60  --  >60  GFRAA >60 >60 >60  --  >60  ANIONGAP 10 10 9   --   13     Hematology Recent Labs  Lab 01/05/18 0354 01/06/18 0318 01/06/18 1607 01/07/18 0404  WBC 37.3* 41.8*  --  42.5*  RBC 3.14* 2.94*  --  3.27*  HGB 9.2* 8.5* 9.5* 9.4*  HCT 27.7* 25.9* 28.0* 28.4*  MCV 88.2 88.1  --  86.9  MCH 29.3 28.9  --  28.7  MCHC 33.2 32.8  --  33.1  RDW 28.8* 28.5*  --  28.4*  PLT 220 233  --  264    Cardiac EnzymesNo results for input(s): TROPONINI in the last 168 hours. No results for input(s): TROPIPOC in the last 168 hours.   BNPNo results for input(s): BNP, PROBNP in the last 168 hours.   DDimer No results for input(s): DDIMER in the last 168 hours.   Radiology    Dg Chest Port 1 View  Result Date: 01/07/2018 CLINICAL DATA:  Score chest.  CABG EXAM: PORTABLE CHEST 1 VIEW COMPARISON:  01/06/2017 FINDINGS: Prior median sternotomy, CABG and valve replacement. Cardiomegaly. Left chest tube and right PICC line remain in place, unchanged. Bibasilar atelectasis. Small bilateral effusions. No pneumothorax. IMPRESSION: Postoperative changes. Left chest tube remains in place without pneumothorax. Bibasilar atelectasis and small effusions. Electronically Signed   By: Rolm Baptise M.D.   On: 01/07/2018 07:15   Dg Chest Port 1 View  Result Date: 01/06/2018 CLINICAL DATA:  Shortness of breath, pleural effusion EXAM: PORTABLE CHEST 1 VIEW COMPARISON:  01/05/2018 FINDINGS: Right PICC line and left chest tube remain in place, unchanged. No visible pneumothorax. Prior CABG. Cardiomegaly with vascular congestion. Diffuse interstitial and alveolar opacities have increased slightly since prior study, likely worsening edema. Small bilateral effusions. IMPRESSION: Slight interval worsening in pulmonary edema pattern. Small bilateral effusions. Electronically Signed   By: Rolm Baptise M.D.   On: 01/06/2018 07:23   Dg Chest Port 1 View  Result Date: 01/05/2018 CLINICAL DATA:  Peripherally inserted catheter EXAM: PORTABLE CHEST 1 VIEW COMPARISON:  Earlier today  FINDINGS: Right upper extremity PICC with tip at the upper SVC. Left IJ sheath in stable position. Chest tubes in stable position. There is artifact from EKG leads. Unchanged low lung volumes with mild elevation of the  left diaphragm. There is atelectatic opacity, vascular congestion, and trace effusions. IMPRESSION: 1. New PICC with tip at the upper SVC. 2. Improved pulmonary edema. Electronically Signed   By: Monte Fantasia M.D.   On: 01/05/2018 10:33    Cardiac Studies   Prior to surgery studies all reviewed  Patient Profile     62 y.o. male with a hx of bicuspid aortic valve stenosis, PVD, obesity, diabetes, tobacco use, atrial fibrillation who is s/p  right carotid endarterectomy by Dr. Carlis Abbott and then CABG with AVR by Dr. Prescott Gum on 01/03/18.  Assessment & Plan    Severe multivessel CAD and moderate-severe bicuspid aortic valve stenosis, with asymptomatic severe R carotid stenosis: s/p R CEA, 4V CABG (LIMA-LAD, SVG-Diag, SVG-RI, SVG-PDA), and AVR with 23 mm Magna Ease Bioprosthesis on 01/03/18 (Dr. Algie Coffer. Prescott Gum)  -EF was 40-45 prior to surgery. Yesterday appeared more volume overloaded, concerning for acute systolic heart failure. Today volume status improved, lungs with some residual dullness in bases but generally improved with diuresis and brisk urine output yesterday.  -excellent diuresis with lasix drip/milrinone/dopamine yesterday, >5L net negative -has had hypokalemia, being repleted -weight today 94 kg, yesterday 96.7, weight on 12/12 prior to admission was 89.4 kg -on aspirin 325 mg, change to 81 mg once apixaban restarted -on atorvastatin 20 mg. LFTs only mildly elevated. Will monitor, ideally would like higher dose prior to discharge.  -ambulating, recovering well. Some mild nausea but no emesis, tolerating PO in general -continue incentive spirometry/flutter  History of paroxysmal atrial fibrillation: Chadsvasc=3 -in afib post op. On amiodarone and  metoprolol -was on apixaban as an outpatient. Wait to restart until closer to discharge. Does not need bridging.  Elevated WBC: followed by Dr. Audelia Hives as an outpatient for myeloproliferative disorder  CRITICAL CARE Total critical care time: 30 minutes. This time includes gathering of history, evaluation of patient's response to treatment, examination of patient, review of laboratory and imaging studies, and coordination with consultants.   For questions or updates, please contact [place] Please consult [email or web address] for contact info under     Signed, Buford Dresser, MD  01/07/2018, 9:25 AM

## 2018-01-07 NOTE — Progress Notes (Signed)
CRITICAL VALUE ALERT  Critical Value: Potassium 2.6  Date & Time Notied:12/20/2017   Orders Received/Actions taken: Protocol for potassium initiated. Will notify physician during morning rounds for further orders.

## 2018-01-07 NOTE — Progress Notes (Signed)
Nurse tech notified nurse that patient's CBG was 73. Nurse brought patient snack and educated the patient on eating a snack to prevent night hypoglycemia. Patient completed snack. Will monitor patient for signs/symptoms of hypoglycemia during shift.

## 2018-01-07 NOTE — Progress Notes (Signed)
[facility]  Subjective  - No acute events overnight.  Now POD#4 s/p R CEA.  Remains on dopamine gtt in ICU.   Objective (!) 97/57 93 98.7 F (37.1 C) (Oral) 13 97%  Intake/Output Summary (Last 24 hours) at 01/07/2018 0743 Last data filed at 01/07/2018 0700 Gross per 24 hour  Intake 1013.25 ml  Output 6105 ml  Net -5091.75 ml    General: sitting in chair in ICU, NAD Right neck incision c/d/i - some fullness still with no interval change, drain out  Laboratory Lab Results: Recent Labs    01/06/18 0318 01/06/18 1607 01/07/18 0404  WBC 41.8*  --  42.5*  HGB 8.5* 9.5* 9.4*  HCT 25.9* 28.0* 28.4*  PLT 233  --  264   BMET Recent Labs    01/06/18 0318 01/06/18 1607 01/07/18 0404  NA 132* 131* 131*  K 3.3* 3.0* 2.6*  CL 96* 90* 87*  CO2 27  --  31  GLUCOSE 116* 111* 199*  BUN 15 15 15   CREATININE 0.81 0.70 0.82  CALCIUM 7.9*  --  8.1*    COAG Lab Results  Component Value Date   INR 1.00 01/03/2018   INR 1.13 12/30/2017   No results found for: PTT  Assessment/Planning: Doing ok POD#4 s/p R CEA.  Some neck fullness that has remained stable.  Appreciate speech evaluation yesterday and plan for dysphagia nectar thick liquids.  No subjective difficulty swallowing.  No focal neurological deficits.  Some hoarseness.    Marty Heck 01/07/2018 7:43 AM --

## 2018-01-08 ENCOUNTER — Inpatient Hospital Stay (HOSPITAL_COMMUNITY): Payer: BLUE CROSS/BLUE SHIELD

## 2018-01-08 DIAGNOSIS — I5043 Acute on chronic combined systolic (congestive) and diastolic (congestive) heart failure: Secondary | ICD-10-CM

## 2018-01-08 LAB — GLUCOSE, CAPILLARY
Glucose-Capillary: 97 mg/dL (ref 70–99)
Glucose-Capillary: 113 mg/dL — ABNORMAL HIGH (ref 70–99)
Glucose-Capillary: 114 mg/dL — ABNORMAL HIGH (ref 70–99)
Glucose-Capillary: 136 mg/dL — ABNORMAL HIGH (ref 70–99)

## 2018-01-08 LAB — CBC
HCT: 27.4 % — ABNORMAL LOW (ref 39.0–52.0)
Hemoglobin: 9 g/dL — ABNORMAL LOW (ref 13.0–17.0)
MCH: 29 pg (ref 26.0–34.0)
MCHC: 32.8 g/dL (ref 30.0–36.0)
MCV: 88.4 fL (ref 80.0–100.0)
Platelets: 281 10*3/uL (ref 150–400)
RBC: 3.1 MIL/uL — ABNORMAL LOW (ref 4.22–5.81)
RDW: 28.6 % — ABNORMAL HIGH (ref 11.5–15.5)
WBC: 47.9 10*3/uL — ABNORMAL HIGH (ref 4.0–10.5)
nRBC: 0.7 % — ABNORMAL HIGH (ref 0.0–0.2)

## 2018-01-08 LAB — BASIC METABOLIC PANEL
Anion gap: 13 (ref 5–15)
BUN: 18 mg/dL (ref 8–23)
CO2: 30 mmol/L (ref 22–32)
Calcium: 8.2 mg/dL — ABNORMAL LOW (ref 8.9–10.3)
Chloride: 89 mmol/L — ABNORMAL LOW (ref 98–111)
Creatinine, Ser: 0.91 mg/dL (ref 0.61–1.24)
GFR calc Af Amer: >60 mL/min (ref >60)
GFR calc non Af Amer: >60 mL/min (ref >60)
Glucose, Bld: 93 mg/dL (ref 70–99)
Potassium: 3.3 mmol/L — ABNORMAL LOW (ref 3.5–5.1)
Sodium: 132 mmol/L — ABNORMAL LOW (ref 135–145)

## 2018-01-08 MED ORDER — DIPHENHYDRAMINE HCL 25 MG PO CAPS: 50.0000 mg | ORAL_CAPSULE | Freq: Every evening | ORAL | Status: DC | PRN

## 2018-01-08 MED ORDER — POTASSIUM CHLORIDE CRYS ER 20 MEQ PO TBCR
20.0000 meq | EXTENDED_RELEASE_TABLET | Freq: Once | ORAL | Status: AC
  Administered 2018-01-08: 20 meq via ORAL
  Filled 2018-01-08: qty 1

## 2018-01-08 NOTE — Progress Notes (Signed)
[facility]  Subjective  - No acute events overnight.  Now POD#5 s/p R CEA.  Out of ICU to floor.   Objective 113/61 86 97.9 F (36.6 C) (Oral) (!) 26 92%  Intake/Output Summary (Last 24 hours) at 01/08/2018 0836 Last data filed at 01/08/2018 0802 Gross per 24 hour  Intake 1191.39 ml  Output 120 ml  Net 1071.39 ml    General: sitting in chair in room, NAD Right neck incision c/d/i - fullness improving, some drainage from previous drain site  Laboratory Lab Results: Recent Labs    01/07/18 0404 01/08/18 0300  WBC 42.5* 47.9*  HGB 9.4* 9.0*  HCT 28.4* 27.4*  PLT 264 281   BMET Recent Labs    01/07/18 0404 01/08/18 0300  NA 131* 132*  K 2.6* 3.3*  CL 87* 89*  CO2 31 30  GLUCOSE 199* 93  BUN 15 18  CREATININE 0.82 0.91  CALCIUM 8.1* 8.2*    COAG Lab Results  Component Value Date   INR 1.00 01/03/2018   INR 1.13 12/30/2017   No results found for: PTT  Assessment/Planning: Doing well POD#5 s/p R CEA.  Some neck fullness that is improving. No focal neurological deficits.  Seems to be making appropriate progress.  Will arrange follow-up in 3 weeks at discharge for wound check.   Marty Heck 01/08/2018 8:36 AM --

## 2018-01-08 NOTE — Progress Notes (Signed)
CARDIAC REHAB PHASE I   PRE:  Rate/Rhythm: 93 Afib  BP:  Sitting: 98/71      SaO2: 93 RA  MODE:  Ambulation: 300 ft 109 peak HR  POST:  Rate/Rhythm: 94 Afib  BP:  Sitting: 120/83    SaO2: 96 RA   Pt ambulated 371ft in hallway assist of one with front wheel walker. Several times pt became unsteady and needed hands on correction. Requested Front wheel walker for home use. Brief d/c ed completed with pt. Pt states he is going home with his wife and mother-in-law. Pt given in-the-tube sheet and heart healthy diet. Encouraged continue IS use.Will refer to CRP II [place]. Pt encouraged to walk twice more today.    [id number] Rufina Falco, RN BSN 01/08/2018 11:06 AM

## 2018-01-08 NOTE — Progress Notes (Addendum)
Justin [name] [address]       [place] [address]             [phone]      5 Days Post-Op Procedure(s) (LRB): AORTIC VALVE REPLACEMENT (AVR) using 82mm Magna Ease Bioprosthesis Aortic Valve (N/A) CORONARY ARTERY BYPASS GRAFTING (CABG) x 4 (LIMA to LAD, SVG to DIAGONAL, SVG to RAMUS INTERMEDIATE, and SVG to PDA), USING LEFT INFTERNAL MAMMARY ARTERY AND (N/A) TRANSESOPHAGEAL ECHOCARDIOGRAM (TEE) (N/A) ENDARTERECTOMY CAROTID (Right) Subjective: Feels more oriented this morning. Plans to walk in the halls.   Objective: Vital signs in last 24 hours: Temp:  [97.7 F (36.5 C)-98.4 F (36.9 C)] 98.4 F (36.9 C) (12/21 0400) Pulse Rate:  [75-96] 77 (12/21 0000) Cardiac Rhythm: Atrial fibrillation (12/21 0700) Resp:  [10-27] 16 (12/21 0400) BP: (96-122)/(65-93) 102/89 (12/21 0400) SpO2:  [92 %-98 %] 94 % (12/21 0400) Weight:  [90.7 kg] 90.7 kg (12/21 0254)     Intake/Output from previous day: 12/20 0701 - 12/21 0700 In: 724.2 [P.O.:600; I.V.:24.2; IV Piggyback:99.9] Out: 120 [Urine:120] Intake/Output this shift: No intake/output data recorded.  General appearance: alert, cooperative and no distress Heart: irregularly irregular rhythm Lungs: clear to auscultation bilaterally Abdomen: soft, non-tender; bowel sounds normal; no masses,  no organomegaly Extremities: 1-2+ pitting edema Wound: clean and dry  Lab Results: Recent Labs    01/07/18 0404 01/08/18 0300  WBC 42.5* 47.9*  HGB 9.4* 9.0*  HCT 28.4* 27.4*  PLT 264 281   BMET:  Recent Labs    01/07/18 0404 01/08/18 0300  NA 131* 132*  K 2.6* 3.3*  CL 87* 89*  CO2 31 30  GLUCOSE 199* 93  BUN 15 18  CREATININE 0.82 0.91  CALCIUM 8.1* 8.2*    PT/INR: No results for input(s): LABPROT, INR in the last 72 hours. ABG    Component Value Date/Time   PHART 7.407 01/04/2018 1546   HCO3 16.8 (L) 01/04/2018 1546   TCO2 30 01/06/2018 1607   ACIDBASEDEF 7.0 (H) 01/04/2018 1546   O2SAT 93.8 01/07/2018  0420   CBG (last 3)  Recent Labs    01/07/18 1605 01/07/18 2104 01/08/18 0559  GLUCAP 110* 107* 97    Assessment/Plan: S/P Procedure(s) (LRB): AORTIC VALVE REPLACEMENT (AVR) using 54mm Magna Ease Bioprosthesis Aortic Valve (N/A) CORONARY ARTERY BYPASS GRAFTING (CABG) x 4 (LIMA to LAD, SVG to DIAGONAL, SVG to RAMUS INTERMEDIATE, and SVG to PDA), USING LEFT INFTERNAL MAMMARY ARTERY AND (N/A) TRANSESOPHAGEAL ECHOCARDIOGRAM (TEE) (N/A) ENDARTERECTOMY CAROTID (Right)  1. CV- hemodynamically stable. Chronic rate-controlled atrial fibrillation on Amio 200mg  BID. BP well controlled. Currently only on lovenox for anticoagulation. Plan to resume NOAC at d/c.  2. Pulm-CXR today is stable, will await official read. He is tolerating room air with good saturation. Continue to work on Chiropodist. Continue xopenex treatments.  3. Renal-creatinine is 1.91, replace potassium. Continue IV lasix.  4. H and H 9.0/27.4, expected acute blood loss anemia and erythrocytosis 5. Endo-blood glucose well controlled.  6. Adverse effects to Ambien-will discontinue at this time. Added Benadryl for sleep.  Plan: Remove EPW today. Remains in chonic rate-controlled atrial fibrillation. Will resume NOAC at discharge. Encouraged incentive spirometer. Continue diuresis for fluid overload.    LOS: 5 days    Elgie Collard 01/08/2018  Dg Chest 2 View  Result Date: 01/08/2018 CLINICAL DATA:  Post CABG 01/03/2018. EXAM: CHEST - 2 VIEW COMPARISON:  01/07/2018 FINDINGS: Right-sided PICC line unchanged with tip over the SVC.  Lungs are adequately inflated with mild hazy prominence of the perihilar vessels suggesting mild vascular congestion. Minimal patchy density in the left infrahilar/retrocardiac region suggesting atelectasis although infection is possible. Small amount of layering posterior bilateral pleural fluid. Mild stable cardiomegaly. Remainder of the exam is unchanged. IMPRESSION: Small posterior layering  bilateral pleural effusions likely with associated basilar atelectasis. Subtle patchy density in the left base/retrocardiac region likely atelectasis, although infection is possible. Mild stable cardiomegaly with suggestion of minimal vascular congestion. Right-sided PICC line unchanged. Electronically Signed   By: Marin Olp M.D.   On: 01/08/2018 09:10   Pacing wires out today, home Monday  I have seen and examined Dartha Lodge and agree with the above assessment  and plan.  Grace Isaac MD Beeper [phone] Office [phone] 01/08/2018 12:05 PM

## 2018-01-08 NOTE — Progress Notes (Signed)
Events noted yesterday- volume status improved. Continue diuresis. Available as needed.  Pixie Casino, MD, [facility], [place] Director of the [facility]  [facility] Diplomate of the American Board of Clinical Lipidology Attending Cardiologist  Direct Dial: [phone]  Fax: [phone]  Website:  [email or web address]

## 2018-01-08 NOTE — Progress Notes (Signed)
Pt given PRN ambien at 12am per request, an hour later pt taking gown off, trying to get out of bed. Easily reoriented, but isn't able to answer why he is getting up. Neuro intact. Has tried several more times to get out of bed on own. Bed alarm on. Will continue to closely monitor.

## 2018-01-09 DIAGNOSIS — Z952 Presence of prosthetic heart valve: Secondary | ICD-10-CM

## 2018-01-09 LAB — BASIC METABOLIC PANEL
Anion gap: 13 (ref 5–15)
BUN: 13 mg/dL (ref 8–23)
CO2: 31 mmol/L (ref 22–32)
Calcium: 8.2 mg/dL — ABNORMAL LOW (ref 8.9–10.3)
Chloride: 88 mmol/L — ABNORMAL LOW (ref 98–111)
Creatinine, Ser: 0.86 mg/dL (ref 0.61–1.24)
GFR calc Af Amer: >60 mL/min (ref >60)
GFR calc non Af Amer: >60 mL/min (ref >60)
Glucose, Bld: 88 mg/dL (ref 70–99)
Potassium: 3.3 mmol/L — ABNORMAL LOW (ref 3.5–5.1)
Sodium: 132 mmol/L — ABNORMAL LOW (ref 135–145)

## 2018-01-09 LAB — CBC
HCT: 27 % — ABNORMAL LOW (ref 39.0–52.0)
Hemoglobin: 8.7 g/dL — ABNORMAL LOW (ref 13.0–17.0)
MCH: 28.9 pg (ref 26.0–34.0)
MCHC: 32.2 g/dL (ref 30.0–36.0)
MCV: 89.7 fL (ref 80.0–100.0)
Platelets: 346 10*3/uL (ref 150–400)
RBC: 3.01 MIL/uL — ABNORMAL LOW (ref 4.22–5.81)
RDW: 28.8 % — ABNORMAL HIGH (ref 11.5–15.5)
WBC: 50.8 10*3/uL (ref 4.0–10.5)
nRBC: 1 % — ABNORMAL HIGH (ref 0.0–0.2)

## 2018-01-09 LAB — GLUCOSE, CAPILLARY
Glucose-Capillary: 82 mg/dL (ref 70–99)
Glucose-Capillary: 103 mg/dL — ABNORMAL HIGH (ref 70–99)
Glucose-Capillary: 114 mg/dL — ABNORMAL HIGH (ref 70–99)
Glucose-Capillary: 102 mg/dL — ABNORMAL HIGH (ref 70–99)

## 2018-01-09 MED ORDER — POTASSIUM CHLORIDE CRYS ER 20 MEQ PO TBCR
40.0000 meq | EXTENDED_RELEASE_TABLET | Freq: Once | ORAL | Status: AC
  Administered 2018-01-09: 40 meq via ORAL

## 2018-01-09 NOTE — Progress Notes (Signed)
Pt ambulated x 470 feet with front wheel walker, pt tolerated well will continue to monitor

## 2018-01-09 NOTE — Progress Notes (Addendum)
Pt's drain site under CEA  and CEA inc continue to ooze blood, have changed dressing twice md aware will continue to monitor

## 2018-01-09 NOTE — Progress Notes (Signed)
Spoke with Nicholes Rough PAC, stated that pacing wires do need to be removed today. Orders received. Will monitor patient. Treanna Dumler, Bettina Gavia RN

## 2018-01-09 NOTE — Progress Notes (Addendum)
[address]       [place] [address]             [phone]      6 Days Post-Op Procedure(s) (LRB): AORTIC VALVE REPLACEMENT (AVR) using 65mm Magna Ease Bioprosthesis Aortic Valve (N/A) CORONARY ARTERY BYPASS GRAFTING (CABG) x 4 (LIMA to LAD, SVG to DIAGONAL, SVG to RAMUS INTERMEDIATE, and SVG to PDA), USING LEFT INFTERNAL MAMMARY ARTERY AND (N/A) TRANSESOPHAGEAL ECHOCARDIOGRAM (TEE) (N/A) ENDARTERECTOMY CAROTID (Right) Subjective: Patient feels like its ground hog day. Feels more alert today than yesterday  Objective: Vital signs in last 24 hours: Temp:  [97.5 F (36.4 C)-98.4 F (36.9 C)] 98.3 F (36.8 C) (12/22 0350) Pulse Rate:  [82-98] 89 (12/22 0350) Cardiac Rhythm: Atrial fibrillation (12/22 0350) Resp:  [17-25] 17 (12/22 0350) BP: (93-111)/(58-65) 99/61 (12/22 0350) SpO2:  [90 %-100 %] 90 % (12/22 0350) Weight:  [90.4 kg] 90.4 kg (12/22 0350)     Intake/Output from previous day: 12/21 0701 - 12/22 0700 In: 740 [P.O.:720; I.V.:20] Out: 800 [Urine:800] Intake/Output this shift: No intake/output data recorded.  General appearance: alert, cooperative and no distress Heart: irregularly irregular rhythm Lungs: clear to auscultation bilaterally Abdomen: soft, non-tender; bowel sounds normal; no masses,  no organomegaly Extremities: 1+ pitting edema R > L Wound: clean and dry  Lab Results: Recent Labs    01/08/18 0300 01/09/18 0432  WBC 47.9* 50.8*  HGB 9.0* 8.7*  HCT 27.4* 27.0*  PLT 281 346   BMET:  Recent Labs    01/08/18 0300 01/09/18 0432  NA 132* 132*  K 3.3* 3.3*  CL 89* 88*  CO2 30 31  GLUCOSE 93 88  BUN 18 13  CREATININE 0.91 0.86  CALCIUM 8.2* 8.2*    PT/INR: No results for input(s): LABPROT, INR in the last 72 hours. ABG    Component Value Date/Time   PHART 7.407 01/04/2018 1546   HCO3 16.8 (L) 01/04/2018 1546   TCO2 30 01/06/2018 1607   ACIDBASEDEF 7.0 (H) 01/04/2018 1546   O2SAT 93.8 01/07/2018 0420    CBG (last 3)  Recent Labs    01/08/18 1622 01/08/18 2036 01/09/18 0622  GLUCAP 114* 136* 82    Assessment/Plan: S/P Procedure(s) (LRB): AORTIC VALVE REPLACEMENT (AVR) using 70mm Magna Ease Bioprosthesis Aortic Valve (N/A) CORONARY ARTERY BYPASS GRAFTING (CABG) x 4 (LIMA to LAD, SVG to DIAGONAL, SVG to RAMUS INTERMEDIATE, and SVG to PDA), USING LEFT INFTERNAL MAMMARY ARTERY AND (N/A) TRANSESOPHAGEAL ECHOCARDIOGRAM (TEE) (N/A) ENDARTERECTOMY CAROTID (Right)  1. CV- hemodynamically stable. Chronic rate-controlled atrial fibrillation on Amio 200mg  BID. BP well controlled but too low to increase BB. Currently only on lovenox for anticoagulation. Plan to resume NOAC at d/c.  2. Pulm-CXR today is stable, will await official read. He is tolerating room air with good saturation. Continue to work on Chiropodist. Continue xopenex treatments PRN.  3. Renal-creatinine is 0.86, replace potassium-will add an extra 40MEQ. Continue IV lasix. Weight continued to trend down 4. H and H 8.7/27.0, expected acute blood loss anemia and erythrocytosis 5. Endo-blood glucose well controlled.  6. Adverse effects to Ambien-will discontinue at this time.  7. WBC- 50k today. Increasing. Hx of leukocytosis. Afebrile. No evidence of infection on exam  Plan: EPW out today-was not done yesterday. Continue diuresis for fluid overload-one more day of IV lasix. Replace potassium. Encouraged ambulation and use of incentive spirometer. Hopefully home tomorrow.    LOS: 6 days    Terance Hart  Harriet Pho 01/09/2018  Continues to improve Some drainage from neck incision Poss home 1- 2 days when neck incision is stable  I have seen and examined Justin [name] and agree with the above assessment  and plan.  Grace Isaac MD Beeper [phone] Office [phone] 01/09/2018 12:36 PM

## 2018-01-09 NOTE — Progress Notes (Signed)
WBC 50.2 was 47.9 yesterday will pass on to day shift

## 2018-01-09 NOTE — Progress Notes (Addendum)
[facility]  Subjective  - doing better with mobility each day.   Objective 95/61 89 (!) 97.5 F (36.4 C) (Oral) 18 98%  Intake/Output Summary (Last 24 hours) at 01/09/2018 0835 Last data filed at 01/08/2018 2113 Gross per 24 hour  Intake 260 ml  Output 800 ml  Net -540 ml    Right neck incision healing well, continued bloody drainage at drain site right neck. No tongue deviation, smile is symmetric and he moves all 4 ext.   Assessment/Planning: POD # 6 right CEA  Stable from a carotid point of view without neurologic deficits. F/U in 3 weeks with Dr. Carlis Abbott WBC seems to be increasing daily will defer to primary team.  Justin [name] 01/09/2018 8:35 AM --  Laboratory Lab Results: Recent Labs    01/08/18 0300 01/09/18 0432  WBC 47.9* 50.8*  HGB 9.0* 8.7*  HCT 27.4* 27.0*  PLT 281 346   BMET Recent Labs    01/08/18 0300 01/09/18 0432  NA 132* 132*  K 3.3* 3.3*  CL 89* 88*  CO2 30 31  GLUCOSE 93 88  BUN 18 13  CREATININE 0.91 0.86  CALCIUM 8.2* 8.2*    COAG Lab Results  Component Value Date   INR 1.00 01/03/2018   INR 1.13 12/30/2017   No results found for: PTT  I have seen and evaluated the patient. I agree with the PA note as documented above. Continues to make progress.  Now POD#6. No neuro deficits.  Neck incision looks good.  Ongoing slow drainage from drain site in neck since removed.  Dressing changed this am.   Marty Heck, MD [facility] Office: [phone] Pager: [phone]

## 2018-01-10 LAB — BASIC METABOLIC PANEL
Anion gap: 14 (ref 5–15)
BUN: 10 mg/dL (ref 8–23)
CO2: 31 mmol/L (ref 22–32)
Calcium: 8.1 mg/dL — ABNORMAL LOW (ref 8.9–10.3)
Chloride: 89 mmol/L — ABNORMAL LOW (ref 98–111)
Creatinine, Ser: 0.77 mg/dL (ref 0.61–1.24)
GFR calc Af Amer: >60 mL/min (ref >60)
GFR calc non Af Amer: >60 mL/min (ref >60)
Glucose, Bld: 71 mg/dL (ref 70–99)
Potassium: 3.1 mmol/L — ABNORMAL LOW (ref 3.5–5.1)
Sodium: 134 mmol/L — ABNORMAL LOW (ref 135–145)

## 2018-01-10 LAB — CBC
HCT: 27.3 % — ABNORMAL LOW (ref 39.0–52.0)
Hemoglobin: 8.8 g/dL — ABNORMAL LOW (ref 13.0–17.0)
MCH: 29.1 pg (ref 26.0–34.0)
MCHC: 32.2 g/dL (ref 30.0–36.0)
MCV: 90.4 fL (ref 80.0–100.0)
Platelets: 340 10*3/uL (ref 150–400)
RBC: 3.02 MIL/uL — ABNORMAL LOW (ref 4.22–5.81)
RDW: 29 % — ABNORMAL HIGH (ref 11.5–15.5)
WBC: 53.5 10*3/uL (ref 4.0–10.5)
nRBC: 0.7 % — ABNORMAL HIGH (ref 0.0–0.2)

## 2018-01-10 LAB — GLUCOSE, CAPILLARY
Glucose-Capillary: 84 mg/dL (ref 70–99)
Glucose-Capillary: 94 mg/dL (ref 70–99)

## 2018-01-10 MED ORDER — POTASSIUM CHLORIDE CRYS ER 20 MEQ PO TBCR: 40.0000 meq | EXTENDED_RELEASE_TABLET | Freq: Every day | ORAL | 0 refills | Status: DC

## 2018-01-10 MED ORDER — TRAMADOL HCL 50 MG PO TABS: 50.0000 mg | ORAL_TABLET | ORAL | 0 refills | Status: DC | PRN

## 2018-01-10 MED ORDER — ASPIRIN EC 81 MG PO TBEC
81.0000 mg | DELAYED_RELEASE_TABLET | Freq: Every day | ORAL | Status: DC
  Administered 2018-01-10: 81 mg via ORAL
  Filled 2018-01-10: qty 1

## 2018-01-10 MED ORDER — FUROSEMIDE 40 MG PO TABS: 40.0000 mg | ORAL_TABLET | Freq: Every day | ORAL | 0 refills | Status: DC

## 2018-01-10 MED ORDER — HYDROXYUREA 500 MG PO CAPS: 500.0000 mg | ORAL_CAPSULE | Freq: Two times a day (BID) | ORAL | 1 refills | Status: DC

## 2018-01-10 MED ORDER — POTASSIUM CHLORIDE CRYS ER 20 MEQ PO TBCR: 40.0000 meq | EXTENDED_RELEASE_TABLET | Freq: Once | ORAL | Status: DC

## 2018-01-10 MED ORDER — FUROSEMIDE 40 MG PO TABS
40.0000 mg | ORAL_TABLET | Freq: Every day | ORAL | Status: DC
  Administered 2018-01-10: 40 mg via ORAL
  Filled 2018-01-10: qty 1

## 2018-01-10 MED ORDER — METOPROLOL TARTRATE 25 MG PO TABS: 12.5000 mg | ORAL_TABLET | Freq: Two times a day (BID) | ORAL | 1 refills | Status: DC

## 2018-01-10 MED ORDER — FERROUS SULFATE 325 (65 FE) MG PO TABS: 325.0000 mg | ORAL_TABLET | Freq: Every day | ORAL | 0 refills | Status: DC

## 2018-01-10 MED ORDER — FINASTERIDE 5 MG PO TABS: 5.0000 mg | ORAL_TABLET | Freq: Every day | ORAL | 0 refills | Status: DC

## 2018-01-10 MED ORDER — POTASSIUM CHLORIDE CRYS ER 20 MEQ PO TBCR: 20.0000 meq | EXTENDED_RELEASE_TABLET | Freq: Every day | ORAL | 0 refills | Status: DC

## 2018-01-10 MED ORDER — AMIODARONE HCL 200 MG PO TABS: 200.0000 mg | ORAL_TABLET | Freq: Two times a day (BID) | ORAL | 1 refills | Status: DC

## 2018-01-10 MED ORDER — POTASSIUM CHLORIDE CRYS ER 20 MEQ PO TBCR
30.0000 meq | EXTENDED_RELEASE_TABLET | Freq: Two times a day (BID) | ORAL | Status: DC
  Administered 2018-01-10: 30 meq via ORAL
  Filled 2018-01-10: qty 1

## 2018-01-10 MED ORDER — ATORVASTATIN CALCIUM 20 MG PO TABS: 20.0000 mg | ORAL_TABLET | Freq: Every day | ORAL | 1 refills | Status: DC

## 2018-01-10 MED ORDER — APIXABAN 5 MG PO TABS: 5.0000 mg | ORAL_TABLET | Freq: Two times a day (BID) | ORAL | Status: DC

## 2018-01-10 MED ORDER — LIDOCAINE HCL (PF) 1 % IJ SOLN
INTRAMUSCULAR | Status: AC
  Administered 2018-01-10: 09:00:00
  Filled 2018-01-10: qty 5

## 2018-01-10 MED ORDER — TRAMADOL HCL 50 MG PO TABS: ORAL_TABLET | ORAL | 0 refills | Status: DC

## 2018-01-10 MED FILL — ATORVASTATIN CALCIUM 20 MG: 20 | 30 days supply | Qty: 30 | Fill #0

## 2018-01-10 MED FILL — FUROSEMIDE 40 MG TABLET: 40 | 5 days supply | Qty: 5 | Fill #0

## 2018-01-10 MED FILL — AMIODARONE HCL 200 MG TAB: 200 | 30 days supply | Qty: 35 | Fill #0

## 2018-01-10 MED FILL — FINASTERIDE 5 MG TABLET: 5 | 30 days supply | Qty: 30 | Fill #0

## 2018-01-10 MED FILL — METOPROLOL TARTRATE 25 MG T: 25 | 30 days supply | Qty: 30 | Fill #0

## 2018-01-10 MED FILL — traMADol HCL 50 MG TABS: 50 | 5 days supply | Qty: 30 | Fill #0

## 2018-01-10 MED FILL — POTASSIUM CL ER 20 MEQ TABL: 20 | 5 days supply | Qty: 5 | Fill #0

## 2018-01-10 NOTE — Progress Notes (Signed)
Pt ambulated x 470 feet around the unit, with front wheel walker pt tolerated well

## 2018-01-10 NOTE — Progress Notes (Signed)
Physical Therapy Treatment Patient Details Name: Justin [name] MRN: [id number] DOB: December 10, 1955 Today's Date: 01/10/2018    History of Present Illness Pt adm for CABG x 4 and AVR and simultaneous rt carotid endarterectomy on 12/16. PMH - CAD, afib, PAD, arthritis, HTN, cervical fusion    PT Comments    Continuing work on functional mobility and activity tolerance;  Noting significant progress with activity tolerance -- able to walk much further; We discussed sternal precautions, and plans at Discharge; OK for dc home from PT standpoint    Follow Up Recommendations  No PT follow up;Other (comment)(cardiac rehab phase 2)     Equipment Recommendations  Rolling walker with 5" wheels    Recommendations for Other Services       Precautions / Restrictions Precautions Precautions: Sternal    Mobility  Bed Mobility           Sit to supine: Supervision   General bed mobility comments: Supervision for safety; kept sternal precautions and did not need assist to bring LEs into bed  Transfers Overall transfer level: Needs assistance Equipment used: None Transfers: Sit to/from Stand Sit to Stand: Supervision         General transfer comment: Good keeping sternal prec  Ambulation/Gait Ambulation/Gait assistance: Supervision Gait Distance (Feet): 300 Feet Assistive device: Rolling walker (2 wheeled) Gait Pattern/deviations: Step-through pattern Gait velocity: decr   General Gait Details: Good control of RW, and better posture; cues to self-monitor for activity tolerance   Stairs             Wheelchair Mobility    Modified Rankin (Stroke Patients Only)       Balance     Sitting balance-Leahy Scale: Good       Standing balance-Leahy Scale: Fair                              Cognition Arousal/Alertness: Awake/alert Behavior During Therapy: WFL for tasks assessed/performed Overall Cognitive Status: Within Functional Limits for tasks  assessed                                        Exercises      General Comments        Pertinent Vitals/Pain Pain Assessment: No/denies pain    Home Living                      Prior Function            PT Goals (current goals can now be found in the care plan section) Acute Rehab PT Goals Patient Stated Goal: return home PT Goal Formulation: With patient Time For Goal Achievement: 01/12/18 Potential to Achieve Goals: Good Progress towards PT goals: Progressing toward goals    Frequency    Min 3X/week      PT Plan Current plan remains appropriate    Co-evaluation              AM-PAC PT "6 Clicks" Mobility   Outcome Measure  Help needed turning from your back to your side while in a flat bed without using bedrails?: None Help needed moving from lying on your back to sitting on the side of a flat bed without using bedrails?: None Help needed moving to and from a bed to a chair (including a wheelchair)?: None Help needed standing  up from a chair using your arms (e.g., wheelchair or bedside chair)?: None Help needed to walk in hospital room?: None Help needed climbing 3-5 steps with a railing? : A Little 6 Click Score: 23    End of Session   Activity Tolerance: Patient tolerated treatment well Patient left: in bed;with call bell/phone within reach;Other (comment)(IV team in to removed PICC) Nurse Communication: Mobility status PT Visit Diagnosis: Other abnormalities of gait and mobility (R26.89)     Time: 9038-3338 PT Time Calculation (min) (ACUTE ONLY): 20 min  Charges:  $Gait Training: 8-22 mins                     Roney Marion, PT  Acute Rehabilitation Services Pager [phone] Office [place] 01/10/2018, 12:58 PM

## 2018-01-10 NOTE — Progress Notes (Signed)
CARDIAC REHAB PHASE I   Pt denied ambulation. Walked once before this morning. Educated on IS use, sternal precautions, exercise guidelines, diet, CRPII, and incision care. Pt was responsive. Referred to CRPII at [facility].   [id number]  [place], ACSM CEP  11:09 AM 01/10/2018

## 2018-01-10 NOTE — Progress Notes (Addendum)
[facility]  Subjective  - Doing better each day.   Objective 97/65 82 98 F (36.7 C) (Oral) 18 93%  Intake/Output Summary (Last 24 hours) at 01/10/2018 0738 Last data filed at 01/09/2018 1722 Gross per 24 hour  Intake 320 ml  Output -  Net 320 ml    Continues venous drainage at drain site No tongue deviation, smile is symmetric Moving all ext without neuro deficits.   Assessment/Planning: POD # 7 right CEA  If venous drainage stops he will be discharged home today F/U with DR. Adda Stokes 2-3 weeks.  Roxy Horseman 01/10/2018 7:38 AM --  Laboratory Lab Results: Recent Labs    01/09/18 0432 01/10/18 0442  WBC 50.8* 53.5*  HGB 8.7* 8.8*  HCT 27.0* 27.3*  PLT 346 340   BMET Recent Labs    01/09/18 0432 01/10/18 0442  NA 132* 134*  K 3.3* 3.1*  CL 88* 89*  CO2 31 31  GLUCOSE 88 71  BUN 13 10  CREATININE 0.86 0.77  CALCIUM 8.2* 8.1*    COAG Lab Results  Component Value Date   INR 1.00 01/03/2018   INR 1.13 12/30/2017   No results found for: PTT  I have seen and evaluated the patient. I agree with the PA note as documented above. Overall doing well POD#7.  Continues to have venous drainage from drain site.  Discussed placing suture.  Will arrange follow-up in 3 weeks for wound check.  Marty Heck, MD [facility] Office: [phone] Pager: [phone]

## 2018-01-10 NOTE — Progress Notes (Addendum)
      [id number]       Justin [name],Justin [name] [id number]             [phone]        7 Days Post-Op Procedure(s) (LRB): AORTIC VALVE REPLACEMENT (AVR) using 41mm Magna Ease Bioprosthesis Aortic Valve (N/A) CORONARY ARTERY BYPASS GRAFTING (CABG) x 4 (LIMA to LAD, SVG to DIAGONAL, SVG to RAMUS INTERMEDIATE, and SVG to PDA), USING LEFT INFTERNAL MAMMARY ARTERY AND (N/A) TRANSESOPHAGEAL ECHOCARDIOGRAM (TEE) (N/A) ENDARTERECTOMY CAROTID (Right)  Subjective: Patient's only complaint is bleeding at JP wound.  Objective: Vital signs in last 24 hours: Temp:  [97.5 F (36.4 C)-98.2 F (36.8 C)] 98 F (36.7 C) (12/23 0400) Pulse Rate:  [76-85] 82 (12/23 0400) Cardiac Rhythm: Atrial fibrillation (12/23 0400) Resp:  [14-18] 18 (12/23 0400) BP: (89-120)/(55-71) 97/65 (12/23 0400) SpO2:  [90 %-98 %] 93 % (12/23 0400) Weight:  [90.9 kg] 90.9 kg (12/23 0400)  Pre op weight 89.4 kg Current Weight  01/10/18 90.9 kg      Intake/Output from previous day: 12/22 0701 - 12/23 0700 In: 320 [P.O.:320] Out: -    Physical Exam:  Cardiovascular: Armandina Stammer Pulmonary: Clear to auscultation bilaterally Abdomen: Soft, non tender, bowel sounds present. Extremities: Trace bilateral lower extremity edema. Wounds: Sternal and right lower leg wounds are clean and dry.  No erythema or signs of infection. JP wound with fair amount of dark bloody drainage.  Lab Results: CBC: Recent Labs    01/09/18 0432 01/10/18 0442  WBC 50.8* 53.5*  HGB 8.7* 8.8*  HCT 27.0* 27.3*  PLT 346 340   BMET:  Recent Labs    01/09/18 0432 01/10/18 0442  NA 132* 134*  K 3.3* 3.1*  CL 88* 89*  CO2 31 31  GLUCOSE 88 71  BUN 13 10  CREATININE 0.86 0.77  CALCIUM 8.2* 8.1*    PT/INR:  Lab Results  Component Value Date   INR 1.00 01/03/2018   INR 1.13 12/30/2017   ABG:  INR: Will add last result for INR, ABG once components are confirmed Will add last 4 CBG results once components are  confirmed  Assessment/Plan:  1. CV - A fib. On Amiodarone 200 mg bid and Lopressor 12.5 mg bid. Will restart Apixaban at discharge and decrease ecasa to 81 mg daily. 2.  Pulmonary - On room air. Encourage incentive spirometer 3. Volume Overload - On Lasix 40 mg IV daily. Will change to oral and give for a few days at discharge 4.  Acute blood loss anemia -  H and H this am stable at 8.8 and 27.3. On Trinsicon 5. Has a history of myeloproliferative disorder -WBC 53,500 this am 6. Supplement potassium 7. DM-CBGs 114/102/84. On Insulin. He was on Metformin XR 500 mg daily. Pre op HGA1C 5.9 8. Remove chest tube sutures 9. Will place sutures at [place] drain site  [place] 01/10/2018,7:11 AM [phone]

## 2018-01-10 NOTE — Progress Notes (Signed)
   [facility] will sign off.    Medication Recommendations:  - Continue Aspirin 81mg  daily.  - Currently on Lipitor 20mg  daily. Consider increasing to 40mg  daily if hepatic function allows. - Patient in atrial fibrillation on telemetry this morning. Continue Amiodarone 200mg  twice daily and Lopressor 12.5mg  twice daily. Plan is to restart Eliquis at discharge. - Continue PO Lasix for post-operative volume overload as blood pressure allows.   Other recommendations (labs, testing, etc):  Recheck hepatic function.  Follow up as an outpatient: Patient has outpatient follow-up scheduled for 01/21/2018 at 10:00am with Rosaria Ferries, PA-C.

## 2018-01-10 NOTE — Progress Notes (Signed)
Betadine swab used to clean right side of neck. 1% Xylocaine used locally around JP drain site. 2-0 Silk suture placed. No active bleeding and patient tolerated procedure well. Will observe for next few hours. If bleeding stopped, will discharge patient home later today, as discussed with Dr. Prescott Gum.

## 2018-01-11 ENCOUNTER — Other Ambulatory Visit: Payer: Self-pay | Admitting: *Deleted

## 2018-01-11 DIAGNOSIS — D473 Essential (hemorrhagic) thrombocythemia: Secondary | ICD-10-CM

## 2018-01-11 DIAGNOSIS — D75839 Thrombocytosis, unspecified: Secondary | ICD-10-CM

## 2018-01-13 ENCOUNTER — Telehealth (HOSPITAL_COMMUNITY): Payer: Self-pay

## 2018-01-13 ENCOUNTER — Inpatient Hospital Stay: Payer: BLUE CROSS/BLUE SHIELD | Admitting: Internal Medicine

## 2018-01-13 ENCOUNTER — Other Ambulatory Visit: Payer: Self-pay | Admitting: *Deleted

## 2018-01-13 ENCOUNTER — Inpatient Hospital Stay: Payer: BLUE CROSS/BLUE SHIELD

## 2018-01-13 ENCOUNTER — Telehealth: Payer: Self-pay | Admitting: Hematology and Oncology

## 2018-01-13 NOTE — Telephone Encounter (Signed)
Called patient to r/s appt from today - per patient needs to see Dr. Darcey Nora first and will call us back after he sees him .

## 2018-01-13 NOTE — Telephone Encounter (Signed)
Pt insurance is active and benefits verified through [employer], Co-pay $25.00, DED $250.00/$250.00 met, out of pocket $2,000.00/$2,000.00 met, co-insurance 0%. No pre-authorization required. Passport, 01/13/18 @ 3:02PM, REF# [id number]  Will contact patient to see if he is interested in the Cardiac Rehab Program. If interested, patient will need to complete follow up appt. Once completed, patient will be contacted for scheduling upon review by the RN Navigator.

## 2018-01-13 NOTE — Telephone Encounter (Signed)
Attempted to call patient in regards to Cardiac Rehab - LM on VM 

## 2018-01-14 ENCOUNTER — Ambulatory Visit: Payer: Self-pay | Admitting: Hematology and Oncology

## 2018-01-14 ENCOUNTER — Other Ambulatory Visit: Payer: Self-pay

## 2018-01-17 ENCOUNTER — Ambulatory Visit (INDEPENDENT_AMBULATORY_CARE_PROVIDER_SITE_OTHER): Payer: Self-pay

## 2018-01-17 DIAGNOSIS — Z4802 Encounter for removal of sutures: Secondary | ICD-10-CM

## 2018-01-17 NOTE — Progress Notes (Signed)
Removed 1 suture from right neck incision, no signs of infection and patient tolerated well. He did have some swelling in both feet but states that he had not been elevating them. He will start today with the elevating of legs and feet if the swelling continues he was instructed to call for possible lasix RX.

## 2018-01-21 ENCOUNTER — Encounter: Payer: Self-pay | Admitting: Physician Assistant

## 2018-01-21 ENCOUNTER — Ambulatory Visit: Payer: BLUE CROSS/BLUE SHIELD | Admitting: Physician Assistant

## 2018-01-21 VITALS — BP 114/60 | HR 83 | Ht 68.0 in | Wt 200.0 lb

## 2018-01-21 DIAGNOSIS — I350 Nonrheumatic aortic (valve) stenosis: Secondary | ICD-10-CM | POA: Diagnosis not present

## 2018-01-21 DIAGNOSIS — I10 Essential (primary) hypertension: Secondary | ICD-10-CM

## 2018-01-21 DIAGNOSIS — Z952 Presence of prosthetic heart valve: Secondary | ICD-10-CM | POA: Diagnosis not present

## 2018-01-21 DIAGNOSIS — I4819 Other persistent atrial fibrillation: Secondary | ICD-10-CM

## 2018-01-21 DIAGNOSIS — I5031 Acute diastolic (congestive) heart failure: Secondary | ICD-10-CM

## 2018-01-21 DIAGNOSIS — E785 Hyperlipidemia, unspecified: Secondary | ICD-10-CM

## 2018-01-21 DIAGNOSIS — Z9889 Other specified postprocedural states: Secondary | ICD-10-CM

## 2018-01-21 DIAGNOSIS — Z951 Presence of aortocoronary bypass graft: Secondary | ICD-10-CM

## 2018-01-21 DIAGNOSIS — D45 Polycythemia vera: Secondary | ICD-10-CM

## 2018-01-21 MED ORDER — FUROSEMIDE 20 MG PO TABS: ORAL_TABLET | ORAL | 3 refills | Status: DC

## 2018-01-21 MED ORDER — POTASSIUM CHLORIDE ER 10 MEQ PO TBCR: 10.0000 meq | EXTENDED_RELEASE_TABLET | Freq: Every day | ORAL | 3 refills | Status: DC

## 2018-01-21 NOTE — Addendum Note (Signed)
Addended by: Gaetano Net on: 01/21/2018 11:07 AM   Modules accepted: Orders

## 2018-01-21 NOTE — Patient Instructions (Addendum)
Medication Instructions:  Your physician has recommended you make the following change in your medication:  1.  START Lasix 20 mg taking 1 tablet daily X's 4 days then take 1 tablet daily only as needed for weight gain of 3 lbs in 1 day or 5 lbs in a week. If you don't lose any weight after the 4th day of taking the 1 tablet, please call our office, we may have to increase it for a few days.  If you need a refill on your cardiac medications before your next appointment, please call your pharmacy.   Lab work: TODAY:  BMET & CBC W/DIFF  If you have labs (blood work) drawn today and your tests are completely normal, you will receive your results only by: Marland Kitchen MyChart Message (if you have MyChart) OR . A paper copy in the mail If you have any lab test that is abnormal or we need to change your treatment, we will call you to review the results.  Testing/Procedures: None ordered  Follow-Up: Your physician recommends that you schedule a follow-up appointment in: [place] ON 02/09/18.    Any Other Special Instructions Will Be Listed Below (If Applicable).

## 2018-01-21 NOTE — Progress Notes (Signed)
Cardiology Office Note   Date:  01/21/2018   ID:  Justin [name], DOB 1955/06/26, MRN [id number]  PCP:  [name], MD Cardiologist:  Buford Dresser, MD 01/07/2018 in hospital Rosaria Ferries, PA-C   No chief complaint on file.   History of Present Illness: Justin [name] is a 63 y.o. male with a history of bicuspid aortic valve stenosis, PVD, obesity, diabetes, tobacco use, atrial fibrillation, polycythemia vera, arthritis.   Admitted 12/16-12/23/2019  forright carotid endarterectomy by Dr. Carlis Abbott and then CABG with AVR by Dr. Prescott Gum on 01/03/18.  Justin [name] presents for cardiology follow up.   He restarted the Hydrea on 01/17/2018.   He has not had labs done since being released from the hospital.   He is slowly increasing his activity. He fell 2 nights ago, mechanical fall but hit the ground fairly hard. Yesterday, he slept all day.   He took the Lasix and potassium as directed after d/c, but still has LE edema.   He is charting daily weights, fluctuates between 194-200. Wt dropped to 194 after d/c, taking Lasix, but has been trending up since then. His wife is cooking w/out salt, reading labels and being very careful. However, pt adds salt at the table, but says much less than previously.  Willing to stop.   He is getting hoarse when he talks, that gets worse during the day. Also feels his L ear is clogged, won't open up. Wonders if it is related to the surgery.   Has been contacted by cardiac rehab, wonders when he can start.   Past Medical History:  Diagnosis Date  . Acute meniscal tear of knee LEFT  . Aortic stenosis 12/01/2017   NONRHEUMATIC, AORTIC VALVE CALCIFICATIONS, MILD TO MODERATE REGURG, MILD TO MODERATE CALCIFIED ANNULUS per ECHO 10/25/17 @ [facility]  . Arthritis   . Atrial fibrillation ([facility]) 11/12/2017   AT O/V WITH PCP  . DM (diabetes mellitus) ([place])   . Heart murmur MILD-- ASYMPTOMATIC  . Hyperlipidemia   .  Hypertension   . Left knee pain   . PAD (peripheral artery disease) ([facility])    left leg claudication    Past Surgical History:  Procedure Laterality Date  . AORTIC VALVE REPLACEMENT N/A 01/03/2018   Procedure: AORTIC VALVE REPLACEMENT (AVR) using 65mm Magna Ease Bioprosthesis Aortic Valve;  Surgeon: Ivin Poot, MD;  Location: [facility];  Service: Open Heart Surgery;  Laterality: N/A;  . APPENDECTOMY  1998  . CATARACT EXTRACTION W/ INTRAOCULAR LENS  IMPLANT, BILATERAL  1998/  2000  . CERVICAL FUSION  1985   C4 - 5  . CORONARY ARTERY BYPASS GRAFT N/A 01/03/2018   Procedure: CORONARY ARTERY BYPASS GRAFTING (CABG) x 4 (LIMA to LAD, SVG to DIAGONAL, SVG to RAMUS INTERMEDIATE, and SVG to PDA), USING LEFT INFTERNAL MAMMARY ARTERY AND;  Surgeon: Prescott Gum, Collier Salina, MD;  Location: [facility];  Service: Open Heart Surgery;  Laterality: N/A;  . ENDARTERECTOMY Right 01/03/2018   Procedure: ENDARTERECTOMY CAROTID;  Surgeon: Marty Heck, MD;  Location: [facility];  Service: Vascular;  Laterality: Right;  . KNEE ARTHROSCOPY W/ MENISCECTOMY  1991   LEFT KNEE  . NASAL SINUS SURGERY  1982  . RIGHT/LEFT HEART CATH AND CORONARY ANGIOGRAPHY N/A 12/24/2017   Procedure: RIGHT/LEFT HEART CATH AND CORONARY ANGIOGRAPHY;  Surgeon: Jolaine Artist, MD;  Location: [facility];  Service: Cardiovascular;  Laterality: N/A;  . ROTATOR CUFF REPAIR  09-04-2005   LEFT  SHOULDER  . TEE WITHOUT CARDIOVERSION N/A 12/24/2017   Procedure: TRANSESOPHAGEAL ECHOCARDIOGRAM (TEE);  Surgeon: Jolaine Artist, MD;  Location: [facility];  Service: Cardiovascular;  Laterality: N/A;  . TEE WITHOUT CARDIOVERSION N/A 01/03/2018   Procedure: TRANSESOPHAGEAL ECHOCARDIOGRAM (TEE);  Surgeon: Prescott Gum, Collier Salina, MD;  Location: [facility];  Service: Open Heart Surgery;  Laterality: N/A;  . TONSILLECTOMY  AGE 26    Current Outpatient Medications  Medication Sig Dispense Refill  . apixaban (ELIQUIS) 5 MG TABS tablet Take 1 tablet (5 mg  total) by mouth 2 (two) times daily.    Marland Kitchen aspirin 81 MG tablet Take 81 mg by mouth daily.     Marland Kitchen atorvastatin (LIPITOR) 20 MG tablet Take 1 tablet (20 mg total) by mouth daily at 6 PM. 30 tablet 1  . Cholecalciferol 125 MCG (5000 UT) capsule Take 1 capsule by mouth daily.     . cyclobenzaprine (FLEXERIL) 10 MG tablet Take 10 mg by mouth 3 (three) times daily.     . ferrous sulfate 325 (65 FE) MG tablet Take 1 tablet (325 mg total) by mouth daily with breakfast. For one month then stop. If develops constipation, may stop sooner 30 tablet 0  . finasteride (PROSCAR) 5 MG tablet Take 1 tablet (5 mg total) by mouth daily. 30 tablet 0  . fluticasone (FLONASE) 50 MCG/ACT nasal spray Place 2 sprays into both nostrils daily.    . hydroxyurea (HYDREA) 500 MG capsule Take 1 capsule (500 mg total) by mouth 2 (two) times daily. May take with food to minimize GI side effects. 60 capsule 1  . loratadine (CLARITIN) 10 MG tablet Take 10 mg by mouth daily.    . metFORMIN (GLUCOPHAGE-XR) 500 MG 24 hr tablet Take 500 mg by mouth daily before supper.    . metoprolol tartrate (LOPRESSOR) 25 MG tablet Take 0.5 tablets (12.5 mg total) by mouth 2 (two) times daily. 30 tablet 1  . nefazodone (SERZONE) 100 MG tablet Take 100 mg by mouth 2 (two) times daily.    . potassium chloride SA (K-DUR,KLOR-CON) 20 MEQ tablet Take 1 tablet (20 mEq total) by mouth daily. For 5 days then stop. 5 tablet 0  . pregabalin (LYRICA) 50 MG capsule Take 50 mg by mouth 3 (three) times daily.    . traMADol (ULTRAM) 50 MG tablet Take 50 mg by mouth every 4-6 hours PRN severe pain 30 tablet 0  . zolpidem (AMBIEN) 10 MG tablet Take 10 mg by mouth at bedtime as needed.     No current facility-administered medications for this visit.     Allergies:   Codeine    Social History:  The patient  reports that he quit smoking about 2 weeks ago. His smoking use included cigars. He has a 5.75 pack-year smoking history. He has never used smokeless tobacco.  He reports current alcohol use. He reports that he does not use drugs.   Family History:  The patient's family history includes Cancer in his father. He was adopted.  is adopted.     ROS:  Please see the history of present illness. All other systems are reviewed and negative.    PHYSICAL EXAM: VS:  BP 114/60   Pulse 83   Ht 5\' 8"  (1.727 m)   Wt 200 lb (90.7 kg)   SpO2 95%   BMI 30.41 kg/m  , BMI Body mass index is 30.41 kg/m. GEN: Well nourished, well developed, male in no acute distress HEENT: normal for age, conjunctivae  are pale Neck: JVD seen on left, some swelling on right at the surgical site, no carotid bruit, no masses Cardiac: Irregular rate and rhythm; soft murmur, no rubs, or gallops Respiratory: Decreased breath sounds bases bilaterally, normal work of breathing GI: soft, nontender, nondistended, + BS MS: no deformity or atrophy; 1+ lower extremity edema; distal pulses are 2+ in upper extremities; decreased pulses at baseline and are not palpable due to edema Skin: warm and dry, no rash; all incisions are healing well with no signs of infection Neuro:  Strength and sensation are intact Psych: euthymic mood, full affect   EKG:  EKG is ordered today. The ekg ordered today demonstrates atrial fibrillation, heart rate 83, no acute changes  ECHO: Intraoperative TEE 01/03/2018  Septum: No Patent Foramen Ovale present.  Left atrium: Patent foramen ovale not present.  Aortic valve: Moderate valve calcification present. Moderate to severe stenosis.  Mitral valve: Mild regurgitation.  Right ventricle: Normal cavity size.    ECHO: TEE 12/24/2017 - Left ventricle: The estimated ejection fraction was 55%. - Aortic valve: Trileaflet; moderately calcified leaflets; fusion   of the left-noncoronary commissure. There was moderate stenosis. - Mitral valve: No evidence of vegetation. There was mild to   moderate regurgitation. - Left atrium: The atrium was dilated. No  evidence of thrombus in   the atrial cavity or appendage. - Right atrium: The atrium was dilated. - Atrial septum: No defect or patent foramen ovale was identified. - Tricuspid valve: No evidence of vegetation. - Pulmonic valve: No evidence of vegetation.  CATH: 12/24/2017  Ost RCA to Prox RCA lesion is 95% stenosed.  Prox LAD lesion is 95% stenosed.  Mid RCA lesion is 80% stenosed.  Dist RCA lesion is 99% stenosed.  Post Atrio lesion is 50% stenosed.  Prox Cx to Mid Cx lesion is 80% stenosed.  Mid LM to Dist LM lesion is 50% stenosed.  Ost 1st Diag lesion is 90% stenosed.  There is moderate aortic valve stenosis.   Findings:  RA = 6 RV = 34/5 PA = 34/9 (20) PCW = 21 Fick cardiac output/index = 5.5/2.7 PVR = < 1.0 WU Ao sat = 97% PA sat = 73%, 74%  Assessment:  1. Moderate aortic stenosis 2. Severe 3v CAD 3. Normal LV function 4. Relatively normal hemodynamics  Plan/Discussion:  Films reviewed with Dr. Prescott Gum. Plan AVR/CABG +/- [place] with Dr. Prescott Gum next week.    Recent Labs: 01/04/2018: Magnesium 2.0 01/07/2018: ALT 23 01/10/2018: BUN 10; Creatinine, Ser 0.77; Hemoglobin 8.8; Platelets 340; Potassium 3.1; Sodium 134  CBC    Component Value Date/Time   WBC 53.5 (HH) 01/10/2018 0442   RBC 3.02 (L) 01/10/2018 0442   HGB 8.8 (L) 01/10/2018 0442   HGB 17.5 (H) 12/21/2017 0948   HCT 27.3 (L) 01/10/2018 0442   PLT 340 01/10/2018 0442   PLT 314 12/21/2017 0948   MCV 90.4 01/10/2018 0442   MCH 29.1 01/10/2018 0442   MCHC 32.2 01/10/2018 0442   RDW 29.0 (H) 01/10/2018 0442   LYMPHSABS 1.6 12/21/2017 0948   MONOABS 1.2 (H) 12/21/2017 0948   EOSABS 0.2 12/21/2017 0948   BASOSABS 0.4 (H) 12/21/2017 0948   CMP Latest Ref Rng & Units 01/10/2018 01/09/2018 01/08/2018  Glucose 70 - 99 mg/dL 71 88 93  BUN 8 - 23 mg/dL 10 13 18   Creatinine 0.61 - 1.24 mg/dL 0.77 0.86 0.91  Sodium 135 - 145 mmol/L 134(L) 132(L) 132(L)  Potassium 3.5 - 5.1 mmol/L  3.1(L) 3.3(L) 3.3(L)  Chloride 98 - 111 mmol/L 89(L) 88(L) 89(L)  CO2 22 - 32 mmol/L 31 31 30   Calcium 8.9 - 10.3 mg/dL 8.1(L) 8.2(L) 8.2(L)  Total Protein 6.5 - 8.1 g/dL - - -  Total Bilirubin 0.3 - 1.2 mg/dL - - -  Alkaline Phos 38 - 126 U/L - - -  AST 15 - 41 U/L - - -  ALT 0 - 44 U/L - - -     Lipid Panel No results found for: CHOL, HDL, LDLCALC, LDLDIRECT, TRIG, CHOLHDL    Wt Readings from Last 3 Encounters:  01/21/18 200 lb (90.7 kg)  01/10/18 200 lb 6.4 oz (90.9 kg)  12/30/17 197 lb (89.4 kg)     Other studies Reviewed: Additional studies/ records that were reviewed today include: office notes, hospital records and testing. .  ASSESSMENT AND PLAN:   1.  S/p CABG w/ AVR: Patient is recovering fairly well, but still feels weak. -He is encouraged to continue walking per cardiac rehab guidelines and recontact them so he can get started as soon as possible. - He is okay for rehab from a cardiac standpoint, but needs to be careful with the surgical limitations which he still has. -Follow-up with surgeons as scheduled.  2.  Right CEA: The incision is healing well. -Follow-up with [facility] as scheduled  3.  Persistent atrial fibrillation: He is in atrial fib today, but is not really aware of it.  His heart rate is controlled and he is anticoagulated.  Continue current doses of Eliquis and metoprolol.  4.  Polycythemia vera: He has restarted the Hydrea, but his white count was > 50,000 and hemoglobin 8.8 when last checked.  Recheck today  5.  Acute diastolic CHF: He took Lasix 40 mg daily after discharge and his weight dropped but has been trending up since then. - Do not feel there is significant sodium indiscretion as his wife is vigilant about the sodium content of the foods they eat. -He does add some salt at the table, is requested to stop -Restart Lasix at 20 mg daily with potassium for 4 days and then as needed for weight gain. - Continue daily weights  6.  Hypokalemia:  Supplement and follow  7. hypertension: Blood pressure is well controlled on current therapy.  8.  Hyperlipidemia: Goal LDL is less than 70 - We will go ahead and refill the Lipitor, lipid profile checks per PCP   Current medicines are reviewed at length with the patient today.  The patient has concerns regarding medicines.  The following changes have been made: Restart Lasix and potassium  Labs/ tests ordered today include:   Orders Placed This Encounter  Procedures  . Basic metabolic panel  . CBC With Differential  . EKG 12-Lead     Disposition:   FU with Buford Dresser, MD  Signed, Rosaria Ferries, PA-C  01/21/2018 11:02 AM    [place] Phone: [phone]; Fax: [phone]

## 2018-01-22 LAB — CBC WITH DIFFERENTIAL
Basophils Absolute: 1.2 10*3/uL — ABNORMAL HIGH (ref 0.0–0.2)
Basos: 4 %
EOS (ABSOLUTE): 1.2 10*3/uL — ABNORMAL HIGH (ref 0.0–0.4)
Eos: 4 %
HEMOGLOBIN: 9.2 g/dL — AB (ref 13.0–17.7)
Hematocrit: 28.2 % — ABNORMAL LOW (ref 37.5–51.0)
Lymphocytes Absolute: 1.2 10*3/uL (ref 0.7–3.1)
Lymphs: 4 %
MCH: 29.3 pg (ref 26.6–33.0)
MCHC: 32.6 g/dL (ref 31.5–35.7)
MCV: 90 fL (ref 79–97)
MONOS ABS: 1.2 10*3/uL — AB (ref 0.1–0.9)
Monocytes: 4 %
NRBC: 1 % — AB (ref 0–0)
Neutrophils Absolute: 23.9 10*3/uL — ABNORMAL HIGH (ref 1.4–7.0)
Neutrophils: 83 %
RBC: 3.14 x10E6/uL — ABNORMAL LOW (ref 4.14–5.80)
RDW: 25.2 % — ABNORMAL HIGH (ref 12.3–15.4)
WBC: 28.8 10*3/uL (ref 3.4–10.8)

## 2018-01-22 LAB — IMMATURE CELLS: Metamyelocytes: 1 % — ABNORMAL HIGH (ref 0–0)

## 2018-01-23 ENCOUNTER — Other Ambulatory Visit: Payer: Self-pay | Admitting: Hematology and Oncology

## 2018-01-25 ENCOUNTER — Other Ambulatory Visit: Payer: Self-pay

## 2018-01-25 ENCOUNTER — Encounter: Payer: Self-pay | Admitting: Vascular Surgery

## 2018-01-25 ENCOUNTER — Ambulatory Visit (INDEPENDENT_AMBULATORY_CARE_PROVIDER_SITE_OTHER): Payer: Self-pay | Admitting: Vascular Surgery

## 2018-01-25 ENCOUNTER — Telehealth (HOSPITAL_COMMUNITY): Payer: Self-pay

## 2018-01-25 DIAGNOSIS — I6521 Occlusion and stenosis of right carotid artery: Secondary | ICD-10-CM

## 2018-01-25 DIAGNOSIS — I6529 Occlusion and stenosis of unspecified carotid artery: Secondary | ICD-10-CM | POA: Insufficient documentation

## 2018-01-25 NOTE — Progress Notes (Signed)
Patient name: Justin [name] MRN: [id number] DOB: Apr 29, 1955 Sex: male  REASON FOR VISIT: Postop check status post right carotid endarterectomy  HPI: Justin [name] is a 63 y.o. male with history of atrial fibrillation, hypertension, hyperlipidemia that presents for 3-week postop check status post right carotid endarterectomy with combined CABG on 01/03/18.  Patient has been doing well at home other than just fatigue.  Feels his neck incision has healed.  He did have a drain and that has since been removed and the drain stitch has been removed as well.  He is back on his Eliquis for A. fib.  He denies any new neurologic deficits including unilateral weakness or vision loss.  He is taking an aspirin with his Eliquis and Lipitor.  Past Medical History:  Diagnosis Date  . Acute meniscal tear of knee LEFT  . Aortic stenosis 12/01/2017   NONRHEUMATIC, AORTIC VALVE CALCIFICATIONS, MILD TO MODERATE REGURG, MILD TO MODERATE CALCIFIED ANNULUS per ECHO 10/25/17 @ [facility]  . Arthritis   . Atrial fibrillation ([place]) 11/12/2017   AT O/V WITH PCP  . DM (diabetes mellitus) ([place])   . Heart murmur MILD-- ASYMPTOMATIC  . Hyperlipidemia   . Hypertension   . Left knee pain   . PAD (peripheral artery disease) ([facility])    left leg claudication    Past Surgical History:  Procedure Laterality Date  . AORTIC VALVE REPLACEMENT N/A 01/03/2018   Procedure: AORTIC VALVE REPLACEMENT (AVR) using 37mm Magna Ease Bioprosthesis Aortic Valve;  Surgeon: Ivin Poot, MD;  Location: [facility];  Service: Open Heart Surgery;  Laterality: N/A;  . APPENDECTOMY  1998  . CATARACT EXTRACTION W/ INTRAOCULAR LENS  IMPLANT, BILATERAL  1998/  2000  . CERVICAL FUSION  1985   C4 - 5  . CORONARY ARTERY BYPASS GRAFT N/A 01/03/2018   Procedure: CORONARY ARTERY BYPASS GRAFTING (CABG) x 4 (LIMA to LAD, SVG to DIAGONAL, SVG to RAMUS INTERMEDIATE, and SVG to PDA), USING LEFT INFTERNAL MAMMARY ARTERY AND;  Surgeon: Prescott Gum, Collier Salina, MD;  Location: [facility];  Service: Open Heart Surgery;  Laterality: N/A;  . ENDARTERECTOMY Right 01/03/2018   Procedure: ENDARTERECTOMY CAROTID;  Surgeon: Marty Heck, MD;  Location: [facility];  Service: Vascular;  Laterality: Right;  . KNEE ARTHROSCOPY W/ MENISCECTOMY  1991   LEFT KNEE  . NASAL SINUS SURGERY  1982  . RIGHT/LEFT HEART CATH AND CORONARY ANGIOGRAPHY N/A 12/24/2017   Procedure: RIGHT/LEFT HEART CATH AND CORONARY ANGIOGRAPHY;  Surgeon: Jolaine Artist, MD;  Location: [facility];  Service: Cardiovascular;  Laterality: N/A;  . ROTATOR CUFF REPAIR  09-04-2005   LEFT SHOULDER  . TEE WITHOUT CARDIOVERSION N/A 12/24/2017   Procedure: TRANSESOPHAGEAL ECHOCARDIOGRAM (TEE);  Surgeon: Jolaine Artist, MD;  Location: [facility];  Service: Cardiovascular;  Laterality: N/A;  . TEE WITHOUT CARDIOVERSION N/A 01/03/2018   Procedure: TRANSESOPHAGEAL ECHOCARDIOGRAM (TEE);  Surgeon: Prescott Gum, Collier Salina, MD;  Location: [facility];  Service: Open Heart Surgery;  Laterality: N/A;  . TONSILLECTOMY  AGE 59    Family History  Adopted: Yes  Problem Relation Age of Onset  . Cancer Father     SOCIAL HISTORY: Social History   Tobacco Use  . Smoking status: Former Smoker    Packs/day: 0.25    Years: 23.00    Pack years: 5.75    Types: Cigars    Last attempt to quit: 01/02/2018    Years since quitting: 0.0  . Smokeless tobacco: Never  Used  . Tobacco comment: quit cigs in 2011 and smokes cigars daily- from 3-10 cigars-09/27/13  Substance Use Topics  . Alcohol use: Yes    Comment: OCCASIONAL    Allergies  Allergen Reactions  . Codeine Swelling    SWELLING REACTION UNSPECIFIED  Tolerates hydrocodone and oxycodone     Current Outpatient Medications  Medication Sig Dispense Refill  . apixaban (ELIQUIS) 5 MG TABS tablet Take 1 tablet (5 mg total) by mouth 2 (two) times daily.    Marland Kitchen aspirin 81 MG tablet Take 81 mg by mouth daily.     . Cholecalciferol 125 MCG (5000 UT)  capsule Take 1 capsule by mouth daily.     . cyclobenzaprine (FLEXERIL) 10 MG tablet Take 10 mg by mouth 3 (three) times daily.     . ferrous sulfate 325 (65 FE) MG tablet Take 1 tablet (325 mg total) by mouth daily with breakfast. For one month then stop. If develops constipation, may stop sooner 30 tablet 0  . finasteride (PROSCAR) 5 MG tablet Take 1 tablet (5 mg total) by mouth daily. 30 tablet 0  . fluticasone (FLONASE) 50 MCG/ACT nasal spray Place 2 sprays into both nostrils daily.    . furosemide (LASIX) 20 MG tablet TAKE 1 TABLET BY MOUTH FOR 4 DAYS THEN TAKE 1 TABLET BY MOUTH DAILY ONLY AS NEEDED FOR WEIGHT GAIN OF 3 LBS IN 1 DAY OR 5 LBS IN 1 WEEK 90 tablet 3  . hydroxyurea (HYDREA) 500 MG capsule Take 1 capsule (500 mg total) by mouth 2 (two) times daily. May take with food to minimize GI side effects. 60 capsule 1  . loratadine (CLARITIN) 10 MG tablet Take 10 mg by mouth daily.    . metFORMIN (GLUCOPHAGE-XR) 500 MG 24 hr tablet Take 500 mg by mouth daily before supper.    . metoprolol tartrate (LOPRESSOR) 25 MG tablet Take 0.5 tablets (12.5 mg total) by mouth 2 (two) times daily. 30 tablet 1  . nefazodone (SERZONE) 100 MG tablet Take 100 mg by mouth 2 (two) times daily.    . potassium chloride (K-DUR) 10 MEQ tablet Take 1 tablet (10 mEq total) by mouth daily. 90 tablet 3  . potassium chloride SA (K-DUR,KLOR-CON) 20 MEQ tablet Take 1 tablet (20 mEq total) by mouth daily. For 5 days then stop. 5 tablet 0  . pregabalin (LYRICA) 50 MG capsule Take 50 mg by mouth 3 (three) times daily.    . traMADol (ULTRAM) 50 MG tablet Take 50 mg by mouth every 4-6 hours PRN severe pain 30 tablet 0  . zolpidem (AMBIEN) 10 MG tablet Take 10 mg by mouth at bedtime as needed.    Marland Kitchen atorvastatin (LIPITOR) 20 MG tablet Take 1 tablet (20 mg total) by mouth daily at 6 PM. (Patient not taking: Reported on 01/25/2018) 30 tablet 1   No current facility-administered medications for this visit.     REVIEW OF SYSTEMS:    [X]  denotes positive finding, [ ]  denotes negative finding Cardiac  Comments:  Chest pain or chest pressure:    Shortness of breath upon exertion:    Short of breath when lying flat:    Irregular heart rhythm:        Vascular    Pain in calf, thigh, or hip brought on by ambulation:    Pain in feet at night that wakes you up from your sleep:     Blood clot in your veins:    Leg swelling:  Pulmonary    Oxygen at home:    Productive cough:     Wheezing:         Neurologic    Sudden weakness in arms or legs:     Sudden numbness in arms or legs:     Sudden onset of difficulty speaking or slurred speech:    Temporary loss of vision in one eye:     Problems with dizziness:         Gastrointestinal    Blood in stool:     Vomited blood:         Genitourinary    Burning when urinating:     Blood in urine:        Psychiatric    Major depression:         Hematologic    Bleeding problems:    Problems with blood clotting too easily:        Skin    Rashes or ulcers:        Constitutional    Fever or chills:      PHYSICAL EXAM: Vitals:   01/25/18 0834 01/25/18 0838  BP: 110/71 120/73  Pulse: 80 80  Resp: 18   Temp: 97.8 F (36.6 C)   TempSrc: Oral   SpO2: 96%   Weight: 191 lb (86.6 kg)   Height: 5\' 8"  (1.727 m)     GENERAL: The patient is a well-nourished male, in no acute distress. The vital signs are documented above. VASCULAR: Right neck incision c/d/i, drain site healed with no drainage, no hematoma PULMONARY: There is good air exchange bilaterally without wheezing or rales. NEUROLOGIC: No focal weakness or paresthesias are detected.  CN II-XII grossly intact. SKIN: There are no ulcers or rashes noted.  DATA:   None  Assessment/Plan:  63 year old male status post right carotid endarterectomy on 01/03/2018 for asymptomatic high-grade stenosis in the setting of a left ICA occlusion.  This was done in combination with CT surgery during CABG.  He has  recovered well.  He has no neurologic deficits on exam today.  His neck incision is healing without issue.  He did have some hoarseness in the hospital but that has improved significantly.  I will plan to see him back in 9 months with a carotid duplex for ongoing surveillance after carotid endarterectomy.  Discussed again that with the left ICA being occluded we do not intervene on this side.  Look forward to seeing him in 9 months.   Marty Heck, MD [facility] Office: [phone] Pager: [place]

## 2018-01-25 NOTE — Telephone Encounter (Signed)
Attempted to call patient in regards to Cardiac Rehab - LM on VM 

## 2018-01-26 ENCOUNTER — Other Ambulatory Visit: Payer: Self-pay | Admitting: *Deleted

## 2018-01-26 ENCOUNTER — Encounter (HOSPITAL_BASED_OUTPATIENT_CLINIC_OR_DEPARTMENT_OTHER): Payer: Self-pay

## 2018-01-26 ENCOUNTER — Other Ambulatory Visit (HOSPITAL_COMMUNITY): Payer: Self-pay | Admitting: Internal Medicine

## 2018-01-26 ENCOUNTER — Telehealth: Payer: Self-pay | Admitting: Hematology

## 2018-01-26 ENCOUNTER — Telehealth: Payer: Self-pay | Admitting: *Deleted

## 2018-01-26 DIAGNOSIS — D75839 Thrombocytosis, unspecified: Secondary | ICD-10-CM

## 2018-01-26 DIAGNOSIS — D473 Essential (hemorrhagic) thrombocythemia: Secondary | ICD-10-CM

## 2018-01-26 DIAGNOSIS — D45 Polycythemia vera: Secondary | ICD-10-CM

## 2018-01-26 MED ORDER — HYDROXYUREA 500 MG PO CAPS: 500.0000 mg | ORAL_CAPSULE | Freq: Two times a day (BID) | ORAL | 0 refills | Status: DC

## 2018-01-26 NOTE — Telephone Encounter (Signed)
Dr. Walden Field (Re-assigned)

## 2018-01-26 NOTE — Telephone Encounter (Signed)
"  [facility]. Requesting refill from Dr. Audelia Hives for Hydroxyurea for this patient."    12-21-2017 LOS noted with not future [facility] appointments.   Noted 01-13-2018 F/U to be rescheduled by patient.  Message requesting newly assigned provider to check In basket Rx requests.

## 2018-01-26 NOTE — Telephone Encounter (Signed)
Called patient per 1/8 sch message - called both numbers - unable to reach patient let message for patient with appt date and time on mobile

## 2018-01-26 NOTE — Telephone Encounter (Signed)
Received notifications that pt needing refills of Hydrea.  Dr. Maylon Peppers notified.  Spoke with pt, and was informed that he cancelled follow up appt with Dr. Walden Field on 01/13/18 until after follow up with Dr. Darcey Nora on 02/16/2018.  Per pt, he had quadruple bypass recently.   Pt stated he takes Hydrea 500 mg  BID.  Will need refill by the end of the month.  Informed pt that he will need to be seen first by a provider ( formed pt of Dr. Audelia Hives ) before refill can be done. Offered pt to come in for lab and visit with Dr. Maylon Peppers today 01/26/2018.  Pt requested to have appt for Wed  02/02/2018. Schedule message sent.  Dr. Maylon Peppers notified.

## 2018-01-31 ENCOUNTER — Other Ambulatory Visit: Payer: Self-pay | Admitting: Hematology

## 2018-01-31 DIAGNOSIS — D45 Polycythemia vera: Secondary | ICD-10-CM

## 2018-01-31 NOTE — Progress Notes (Signed)
[facility] CONSULT NOTE  Patient Care Team: [name], MD as PCP - General (Family Medicine) Buford Dresser, MD as PCP - Cardiology (Cardiology)  HEME/ONC OVERVIEW: 1. Polycythemia vera, JAK2 V617 mutation+; high risk (age >76) -Previous patient of Dr. Audelia Hives  -11/2017: Hgb ~17; WBC ~30k w/ nl diff, plts ~500k; NGS showed JAK2 V617F mutation (83% allele frequency), CAL-R mutation (4% allele frequency); BCR/ALB negative; no bone marrow biopsy done  -11/2017 - present: Hydrea 576m BID -12/2017 - present: phlebotomy x 1   TREATMENT REGIMEN:  Phlebotomy, 12/2017 - present; most recent on 12/23/2017  Hydrea 5033mBID, 12/03/2017 - present   PERTINENT NON-HEM/ONC PROBLEMS: 1. CAD s/p CABG x 4 in 12/2017 2. Moderate to severe aortic stenosis s/p aortic valve replacement and left atrial clip in 12/2017   ASSESSMENT & PLAN:  Polycythemia vera, JAK2+; high risk  -Previous patient of Dr. RuAudelia Hives-Patient underwent extensive work-up in 11/2017, which showed JAK2 V617 mutation with allele frequency of 83%, consistent with polycythemia vera; no bone marrow biopsy done at that time -Patient was started on Hydrea 500 mg twice daily due to high risk (age > 6063in 11/2017; in addition, he received 1 session of phlebotomy in early 12/2017 -He underwent CABG x 4 and aortic valve replacement in mid 12/2017, requiring several RBC transfusions; since then, Hgb has been low between 9 and 10 due to acute blood loss from surgeries -Given the recent acute blood loss, there is no indication for phlebotomy at this time -Goal Hct < 45%   Thrombocytosis  -Secondary to PV -Plt 312k today, improving  -While there is no evidence-based data to recommend a target platelet count for patients receiving cytoreductive therapy, normalization of plt count would be ideal, given the patient's cardiovascular risk factors -Continue Hydrea 500108mID for now; I have refilled the patient's  prescription today  Leukocytosis -Secondary to PV  -Leukocytosis usually improves with initiation of Hydrea; WBC 24k today, improving  -Patient denies any symptoms of infection or constitutional symptoms  -However, if it continues to persist or even rises in discordance with other blood counts, then bone marrow biopsy may be indicated to rule out early transformation to MF   CAD s/p CABG -Patient requested to have lipid profile checked at next visit prior to his appointment with cardiology -I encouraged patient to follow-up with his cardiologist regarding the management of coronary artery disease, including statin  Smoking cessation -I congratulated patient on stopping smoking, and counseled the patient on the importance of abstinence from tobacco products, given his significant cardiac history  Orders Placed This Encounter  Procedures  . CBC with Differential (Cancer Center Only)    Standing Status:   Future    Standing Expiration Date:   03/09/2019  . CMP ([facility])    Standing Status:   Future    Standing Expiration Date:   03/09/2019  . Lactate dehydrogenase    Standing Status:   Future    Standing Expiration Date:   03/09/2019  . Lipid panel    Standing Status:   Future    Standing Expiration Date:   02/02/2019   A total of more than 25 minutes were spent face-to-face with the patient during this encounter and over half of that time was spent on counseling and coordination of care as outlined above.    All questions were answered. The patient knows to call the clinic with any problems, questions or concerns.  Return in 2 weeks for  labs and clinic follow-up.   Tish Men, MD 02/02/2018 10:34 AM   CHIEF COMPLAINTS/PURPOSE OF CONSULTATION:  "I am still tired"  HISTORY OF PRESENTING ILLNESS:  Justin [name] 63 y.o. male is here because of PV.  Patient reports that since his recent surgery, he is still feeling generalized fatigue, but is overall improving.  He has  been trying to eat better and exercise more, and has lost approximately 20 pounds over the past 6 months.  He also has stopped smoking.  He is tolerating Hydrea 500 mg twice daily well without significant side effects, such as rash, pruritus, nausea or vomiting.  He denies any constitutional symptoms.  I have reviewed his chart and materials related to his cancer extensively and collaborated history with the patient. Summary of oncologic history is as follows:  No history exists.    MEDICAL HISTORY:  Past Medical History:  Diagnosis Date  . Acute meniscal tear of knee LEFT  . Aortic stenosis 12/01/2017   NONRHEUMATIC, AORTIC VALVE CALCIFICATIONS, MILD TO MODERATE REGURG, MILD TO MODERATE CALCIFIED ANNULUS per ECHO 10/25/17 @ [facility]  . Arthritis   . Atrial fibrillation ([facility]) 11/12/2017   AT O/V WITH PCP  . DM (diabetes mellitus) ([facility])   . Heart murmur MILD-- ASYMPTOMATIC  . Hyperlipidemia   . Hypertension   . Left knee pain   . PAD (peripheral artery disease) ([facility])    left leg claudication    SURGICAL HISTORY: Past Surgical History:  Procedure Laterality Date  . AORTIC VALVE REPLACEMENT N/A 01/03/2018   Procedure: AORTIC VALVE REPLACEMENT (AVR) using 37m Magna Ease Bioprosthesis Aortic Valve;  Surgeon: VIvin Poot MD;  [facility]  Service: Open Heart Surgery;  Laterality: N/A;  . APPENDECTOMY  1998  . CATARACT EXTRACTION W/ INTRAOCULAR LENS  IMPLANT, BILATERAL  1998/  2000  . CERVICAL FUSION  1985   C4 - 5  . CORONARY ARTERY BYPASS GRAFT N/A 01/03/2018   Procedure: CORONARY ARTERY BYPASS GRAFTING (CABG) x 4 (LIMA to LAD, SVG to DIAGONAL, SVG to RAMUS INTERMEDIATE, and SVG to PDA), USING LEFT INFTERNAL MAMMARY ARTERY AND;  Surgeon: VPrescott Gum PCollier Salina MD;  [facility]  Service: Open Heart Surgery;  Laterality: N/A;  . ENDARTERECTOMY Right 01/03/2018   Procedure: ENDARTERECTOMY CAROTID;  Surgeon: CMarty Heck MD;  [facility]  Service:  Vascular;  Laterality: Right;  . KNEE ARTHROSCOPY W/ MENISCECTOMY  1991   LEFT KNEE  . NASAL SINUS SURGERY  1982  . RIGHT/LEFT HEART CATH AND CORONARY ANGIOGRAPHY N/A 12/24/2017   Procedure: RIGHT/LEFT HEART CATH AND CORONARY ANGIOGRAPHY;  Surgeon: BJolaine Artist MD;  [facility];  Service: Cardiovascular;  Laterality: N/A;  . ROTATOR CUFF REPAIR  09-04-2005   LEFT SHOULDER  . TEE WITHOUT CARDIOVERSION N/A 12/24/2017   Procedure: TRANSESOPHAGEAL ECHOCARDIOGRAM (TEE);  Surgeon: BJolaine Artist MD;  [facility];  Service: Cardiovascular;  Laterality: N/A;  . TEE WITHOUT CARDIOVERSION N/A 01/03/2018   Procedure: TRANSESOPHAGEAL ECHOCARDIOGRAM (TEE);  Surgeon: VPrescott Gum PCollier Salina MD;  [facility]  Service: Open Heart Surgery;  Laterality: N/A;  . TONSILLECTOMY  AGE 72    SOCIAL HISTORY: Social History   Socioeconomic History  . Marital status: Married    Spouse name: Not on file  . Number of children: Not on file  . Years of education: Not on file  . Highest education level: Not on file  Occupational History  . Not on file  Social Needs  . Financial resource strain: Not on file  . Food insecurity:    Worry: Not on file    Inability: Not on file  . Transportation needs:    Medical: Not on file    Non-medical: Not on file  Tobacco Use  . Smoking status: Former Smoker    Packs/day: 0.25    Years: 23.00    Pack years: 5.75    Types: Cigars    Last attempt to quit: 01/02/2018    Years since quitting: 0.0  . Smokeless tobacco: Never Used  . Tobacco comment: quit cigs in 2011 and smokes cigars daily- from 3-10 cigars-09/27/13  Substance and Sexual Activity  . Alcohol use: Yes    Comment: OCCASIONAL  . Drug use: No  . Sexual activity: Not on file  Lifestyle  . Physical activity:    Days per week: Not on file    Minutes per session: Not on file  . Stress: Not on file  Relationships  . Social connections:    Talks on phone: Not on file     Gets together: Not on file    Attends religious service: Not on file    Active member of club or organization: Not on file    Attends meetings of clubs or organizations: Not on file    Relationship status: Not on file  . Intimate partner violence:    Fear of current or ex partner: Not on file    Emotionally abused: Not on file    Physically abused: Not on file    Forced sexual activity: Not on file  Other Topics Concern  . Not on file  Social History Narrative  . Not on file    FAMILY HISTORY: Family History  Adopted: Yes  Problem Relation Age of Onset  . Cancer Father     ALLERGIES:  is allergic to codeine.  MEDICATIONS:  Current Outpatient Medications  Medication Sig Dispense Refill  . apixaban (ELIQUIS) 5 MG TABS tablet Take 1 tablet (5 mg total) by mouth 2 (two) times daily.    Marland Kitchen aspirin 81 MG tablet Take 81 mg by mouth daily.     . Cholecalciferol 125 MCG (5000 UT) capsule Take 1 capsule by mouth daily.     . cyclobenzaprine (FLEXERIL) 10 MG tablet Take 10 mg by mouth 3 (three) times daily.     . ferrous sulfate 325 (65 FE) MG tablet Take 1 tablet (325 mg total) by mouth daily with breakfast. For one month then stop. If develops constipation, may stop sooner 30 tablet 0  . finasteride (PROSCAR) 5 MG tablet Take 1 tablet (5 mg total) by mouth daily. 30 tablet 0  . fluticasone (FLONASE) 50 MCG/ACT nasal spray Place 2 sprays into both nostrils daily.    . hydroxyurea (HYDREA) 500 MG capsule Take 1 capsule (500 mg total) by mouth 2 (two) times daily for 30 days. May take with food to minimize GI side effects. 60 capsule 2  . loratadine (CLARITIN) 10 MG tablet Take 10 mg by mouth daily.    . metFORMIN (GLUCOPHAGE-XR) 500 MG 24 hr tablet Take 500 mg by mouth daily before supper.    . metoprolol tartrate (LOPRESSOR) 25 MG tablet Take 0.5 tablets (12.5 mg total) by mouth 2 (two) times daily. 30 tablet 1  . nefazodone (SERZONE) 100 MG tablet Take 100 mg by mouth 2 (two) times  daily.    . pregabalin (LYRICA) 50 MG capsule Take 50 mg by mouth  3 (three) times daily.    Marland Kitchen zolpidem (AMBIEN) 10 MG tablet Take 10 mg by mouth at bedtime as needed.    Marland Kitchen atorvastatin (LIPITOR) 20 MG tablet Take 1 tablet (20 mg total) by mouth daily at 6 PM. (Patient not taking: Reported on 01/25/2018) 30 tablet 1  . furosemide (LASIX) 20 MG tablet TAKE 1 TABLET BY MOUTH FOR 4 DAYS THEN TAKE 1 TABLET BY MOUTH DAILY ONLY AS NEEDED FOR WEIGHT GAIN OF 3 LBS IN 1 DAY OR 5 LBS IN 1 WEEK (Patient not taking: Reported on 02/02/2018) 90 tablet 3  . potassium chloride (K-DUR) 10 MEQ tablet Take 1 tablet (10 mEq total) by mouth daily. (Patient not taking: Reported on 02/02/2018) 90 tablet 3  . traMADol (ULTRAM) 50 MG tablet Take 50 mg by mouth every 4-6 hours PRN severe pain (Patient not taking: Reported on 02/02/2018) 30 tablet 0   No current facility-administered medications for this visit.     REVIEW OF SYSTEMS:   Constitutional: ( - ) fevers, ( - )  chills , ( - ) night sweats Eyes: ( - ) blurriness of vision, ( - ) double vision, ( - ) watery eyes Ears, nose, mouth, throat, and face: ( - ) mucositis, ( - ) sore throat Respiratory: ( - ) cough, ( - ) dyspnea, ( - ) wheezes Cardiovascular: ( - ) palpitation, ( - ) chest discomfort, ( - ) lower extremity swelling Gastrointestinal:  ( - ) nausea, ( - ) heartburn, ( - ) change in bowel habits Skin: ( - ) abnormal skin rashes Lymphatics: ( - ) new lymphadenopathy, ( - ) easy bruising Neurological: ( - ) numbness, ( - ) tingling, ( - ) new weaknesses Behavioral/Psych: ( - ) mood change, ( - ) new changes  All other systems were reviewed with the patient and are negative.  PHYSICAL EXAMINATION: ECOG PERFORMANCE STATUS: 1 - Symptomatic but completely ambulatory  Vitals:   02/02/18 0958  BP: 132/70  Pulse: 80  Resp: 18  Temp: 97.8 F (36.6 C)  SpO2: 99%   Filed Weights   02/02/18 0958  Weight: 192 lb 4.8 oz (87.2 kg)    GENERAL: alert, no  distress and comfortable, slightly pale appearing  SKIN: skin color, texture, turgor are normal, no rashes or significant lesions EYES: conjunctiva are pink and non-injected, sclera clear OROPHARYNX: no exudate, no erythema; lips, buccal mucosa, and tongue normal  NECK: supple, non-tender LYMPH:  no palpable lymphadenopathy in the cervical LUNGS: clear to auscultation and percussion with normal breathing effort HEART: regular rate & rhythm, no murmurs, no lower extremity edema ABDOMEN: soft, non-tender, non-distended, normal bowel sounds Musculoskeletal: no cyanosis of digits and no clubbing  PSYCH: alert & oriented x 3, fluent speech NEURO: no focal motor/sensory deficits  LABORATORY DATA:  I have reviewed the data as listed Lab Results  Component Value Date   WBC 24.1 (H) 02/02/2018   HGB 10.9 (L) 02/02/2018   HCT 33.8 (L) 02/02/2018   MCV 94.7 02/02/2018   PLT 312 02/02/2018   Lab Results  Component Value Date   NA 134 (L) 02/02/2018   K 3.9 02/02/2018   CL 99 02/02/2018   CO2 26 02/02/2018    RADIOGRAPHIC STUDIES: I have personally reviewed the radiological images as listed and agreed with the findings in the report. Dg Chest 2 View  Result Date: 01/08/2018 CLINICAL DATA:  Post CABG 01/03/2018. EXAM: CHEST - 2 VIEW COMPARISON:  01/07/2018 FINDINGS: Right-sided  PICC line unchanged with tip over the SVC. Lungs are adequately inflated with mild hazy prominence of the perihilar vessels suggesting mild vascular congestion. Minimal patchy density in the left infrahilar/retrocardiac region suggesting atelectasis although infection is possible. Small amount of layering posterior bilateral pleural fluid. Mild stable cardiomegaly. Remainder of the exam is unchanged. IMPRESSION: Small posterior layering bilateral pleural effusions likely with associated basilar atelectasis. Subtle patchy density in the left base/retrocardiac region likely atelectasis, although infection is possible. Mild  stable cardiomegaly with suggestion of minimal vascular congestion. Right-sided PICC line unchanged. Electronically Signed   By: Marin Olp M.D.   On: 01/08/2018 09:10   Dg Chest Port 1 View  Result Date: 01/07/2018 CLINICAL DATA:  Score chest.  CABG EXAM: PORTABLE CHEST 1 VIEW COMPARISON:  01/06/2017 FINDINGS: Prior median sternotomy, CABG and valve replacement. Cardiomegaly. Left chest tube and right PICC line remain in place, unchanged. Bibasilar atelectasis. Small bilateral effusions. No pneumothorax. IMPRESSION: Postoperative changes. Left chest tube remains in place without pneumothorax. Bibasilar atelectasis and small effusions. Electronically Signed   By: Rolm Baptise M.D.   On: 01/07/2018 07:15   Dg Chest Port 1 View  Result Date: 01/06/2018 CLINICAL DATA:  Shortness of breath, pleural effusion EXAM: PORTABLE CHEST 1 VIEW COMPARISON:  01/05/2018 FINDINGS: Right PICC line and left chest tube remain in place, unchanged. No visible pneumothorax. Prior CABG. Cardiomegaly with vascular congestion. Diffuse interstitial and alveolar opacities have increased slightly since prior study, likely worsening edema. Small bilateral effusions. IMPRESSION: Slight interval worsening in pulmonary edema pattern. Small bilateral effusions. Electronically Signed   By: Rolm Baptise M.D.   On: 01/06/2018 07:23   Dg Chest Port 1 View  Result Date: 01/05/2018 CLINICAL DATA:  Peripherally inserted catheter EXAM: PORTABLE CHEST 1 VIEW COMPARISON:  Earlier today FINDINGS: Right upper extremity PICC with tip at the upper SVC. Left IJ sheath in stable position. Chest tubes in stable position. There is artifact from EKG leads. Unchanged low lung volumes with mild elevation of the left diaphragm. There is atelectatic opacity, vascular congestion, and trace effusions. IMPRESSION: 1. New PICC with tip at the upper SVC. 2. Improved pulmonary edema. Electronically Signed   By: Monte Fantasia M.D.   On: 01/05/2018 10:33    Dg Chest Port 1 View  Result Date: 01/05/2018 CLINICAL DATA:  Chest tube, post AVR EXAM: PORTABLE CHEST 1 VIEW COMPARISON:  01/04/2018 FINDINGS: Interval removal of Swan-Ganz catheter, endotracheal tube and NG tube. Left chest tube remains in place. No visible pneumothorax. Cardiomegaly with vascular congestion and diffuse interstitial opacities, slightly increased, likely edema. Bibasilar atelectasis and small layering effusions. IMPRESSION: Interval extubation. No pneumothorax. Increasing interstitial opacities, likely edema. Increasing bibasilar atelectasis. Small layering effusions. Electronically Signed   By: Rolm Baptise M.D.   On: 01/05/2018 07:22   Dg Chest Port 1 View  Result Date: 01/04/2018 CLINICAL DATA:  ETT evaluation. EXAM: PORTABLE CHEST 1 VIEW COMPARISON:  December 24, 2016 FINDINGS: The ETT terminates in good position. The PA catheter is unchanged in good position. The enteric tube terminates below today's film. Persistent cardiomegaly. The hila and mediastinum are unchanged. The right lung is clear. Mild opacity remains in the left base. There is a left-sided chest tube without pneumothorax. IMPRESSION: 1. Support apparatus as above. 2. Opacity remains in the left base, probably atelectasis. Recommend attention on follow-up. 3. No other change. Electronically Signed   By: Dorise Bullion III M.D   On: 01/04/2018 08:34   Dg Chest Portable 1 View  Result Date: 01/03/2018 CLINICAL DATA:  CABG. EXAM: PORTABLE CHEST 1 VIEW COMPARISON:  Chest x-ray dated December 30, 2017. FINDINGS: Endotracheal tube in position with the tip 4.2 cm above the level of the carina. Right internal jugular Swan-Ganz catheter with the tip in the main pulmonary outflow tract. Mediastinal and left chest tubes are in good position. Interval CABG, AVR, and left atrial appendage clipping. Small left pleural effusion with left lower lobe consolidation/atelectasis. Patchy increased density in the left mid lung may  reflect asymmetric pulmonary edema. The right lung is clear. No pneumothorax. No acute osseous abnormality. IMPRESSION: 1. Lines and tubes as above.  No pneumothorax. 2. Small left pleural effusion and asymmetric left-sided pulmonary edema. Left lower lobe consolidation/atelectasis. Electronically Signed   By: Titus Dubin M.D.   On: 01/03/2018 20:22   Dg Chest Port 1 View  Result Date: 01/03/2018 CLINICAL DATA:  Status post aortic valve replacement EXAM: PORTABLE CHEST 1 VIEW COMPARISON:  Chest radiograph 01/03/2018 at 18:18 FINDINGS: Support Apparatus: --Endotracheal tube: Tip at the level of the clavicular heads. --Enteric tube:Sideport projects over the distal esophagus. Recommend advancing by 7 cm. --Catheter(s):Left IJ approach pulmonary arterial catheter with tip projecting over the main pulmonary artery. --Other: Left chest tube tip projects over the left mid lung. Paramedian mediastinal drain. Moderate cardiomegaly with mild airspace opacity in the left lung. Right lung is clear. IMPRESSION: 1. Recommend advancing the enteric tube by 7 cm to ensure side port below the gastroesophageal junction. 2. Satisfactory appearance of the remainder of the support apparatus. 3. Mild left lung atelectasis. Electronically Signed   By: Ulyses Jarred M.D.   On: 01/03/2018 19:50   [place] Ekg Site Rite  Result Date: 01/05/2018 If Site Rite image not attached, placement could not be confirmed due to current cardiac rhythm.   PATHOLOGY: I have reviewed the pathology reports as documented in the oncologist history.

## 2018-02-02 ENCOUNTER — Inpatient Hospital Stay: Payer: BLUE CROSS/BLUE SHIELD

## 2018-02-02 ENCOUNTER — Inpatient Hospital Stay: Payer: BLUE CROSS/BLUE SHIELD | Attending: Hematology and Oncology | Admitting: Hematology

## 2018-02-02 ENCOUNTER — Telehealth: Payer: Self-pay

## 2018-02-02 ENCOUNTER — Encounter: Payer: Self-pay | Admitting: Hematology

## 2018-02-02 VITALS — BP 132/70 | HR 80 | Temp 97.8°F | Resp 18 | Ht 68.0 in | Wt 192.3 lb

## 2018-02-02 DIAGNOSIS — Z7984 Long term (current) use of oral hypoglycemic drugs: Secondary | ICD-10-CM

## 2018-02-02 DIAGNOSIS — Z87891 Personal history of nicotine dependence: Secondary | ICD-10-CM

## 2018-02-02 DIAGNOSIS — R7989 Other specified abnormal findings of blood chemistry: Secondary | ICD-10-CM

## 2018-02-02 DIAGNOSIS — Z7901 Long term (current) use of anticoagulants: Secondary | ICD-10-CM | POA: Diagnosis not present

## 2018-02-02 DIAGNOSIS — Z7982 Long term (current) use of aspirin: Secondary | ICD-10-CM | POA: Diagnosis not present

## 2018-02-02 DIAGNOSIS — D45 Polycythemia vera: Secondary | ICD-10-CM | POA: Diagnosis not present

## 2018-02-02 DIAGNOSIS — D473 Essential (hemorrhagic) thrombocythemia: Secondary | ICD-10-CM

## 2018-02-02 DIAGNOSIS — Z79899 Other long term (current) drug therapy: Secondary | ICD-10-CM

## 2018-02-02 DIAGNOSIS — D75839 Thrombocytosis, unspecified: Secondary | ICD-10-CM

## 2018-02-02 DIAGNOSIS — F17290 Nicotine dependence, other tobacco product, uncomplicated: Secondary | ICD-10-CM | POA: Insufficient documentation

## 2018-02-02 DIAGNOSIS — E785 Hyperlipidemia, unspecified: Secondary | ICD-10-CM | POA: Diagnosis not present

## 2018-02-02 DIAGNOSIS — M129 Arthropathy, unspecified: Secondary | ICD-10-CM | POA: Diagnosis not present

## 2018-02-02 DIAGNOSIS — E119 Type 2 diabetes mellitus without complications: Secondary | ICD-10-CM | POA: Diagnosis not present

## 2018-02-02 DIAGNOSIS — I2583 Coronary atherosclerosis due to lipid rich plaque: Secondary | ICD-10-CM

## 2018-02-02 DIAGNOSIS — I2510 Atherosclerotic heart disease of native coronary artery without angina pectoris: Secondary | ICD-10-CM

## 2018-02-02 DIAGNOSIS — J90 Pleural effusion, not elsewhere classified: Secondary | ICD-10-CM | POA: Insufficient documentation

## 2018-02-02 DIAGNOSIS — D72829 Elevated white blood cell count, unspecified: Secondary | ICD-10-CM | POA: Insufficient documentation

## 2018-02-02 DIAGNOSIS — I4891 Unspecified atrial fibrillation: Secondary | ICD-10-CM | POA: Diagnosis not present

## 2018-02-02 DIAGNOSIS — I739 Peripheral vascular disease, unspecified: Secondary | ICD-10-CM | POA: Insufficient documentation

## 2018-02-02 DIAGNOSIS — I10 Essential (primary) hypertension: Secondary | ICD-10-CM | POA: Diagnosis not present

## 2018-02-02 DIAGNOSIS — F17210 Nicotine dependence, cigarettes, uncomplicated: Secondary | ICD-10-CM

## 2018-02-02 LAB — CBC WITH DIFFERENTIAL (CANCER CENTER ONLY)
Abs Immature Granulocytes: 1.01 10*3/uL — ABNORMAL HIGH (ref 0.00–0.07)
Basophils Absolute: 0.3 10*3/uL — ABNORMAL HIGH (ref 0.0–0.1)
Basophils Relative: 1 %
Eosinophils Absolute: 0.3 10*3/uL (ref 0.0–0.5)
Eosinophils Relative: 1 %
HCT: 33.8 % — ABNORMAL LOW (ref 39.0–52.0)
Hemoglobin: 10.9 g/dL — ABNORMAL LOW (ref 13.0–17.0)
Immature Granulocytes: 4 %
Lymphocytes Relative: 9 %
Lymphs Abs: 2.1 10*3/uL (ref 0.7–4.0)
MCH: 30.5 pg (ref 26.0–34.0)
MCHC: 32.2 g/dL (ref 30.0–36.0)
MCV: 94.7 fL (ref 80.0–100.0)
Monocytes Absolute: 0.9 10*3/uL (ref 0.1–1.0)
Monocytes Relative: 4 %
Neutro Abs: 19.5 10*3/uL — ABNORMAL HIGH (ref 1.7–7.7)
Neutrophils Relative %: 81 %
Platelet Count: 312 10*3/uL (ref 150–400)
RBC: 3.57 MIL/uL — ABNORMAL LOW (ref 4.22–5.81)
RDW: 26.5 % — ABNORMAL HIGH (ref 11.5–15.5)
WBC Count: 24.1 10*3/uL — ABNORMAL HIGH (ref 4.0–10.5)
nRBC: 0.2 % (ref 0.0–0.2)

## 2018-02-02 LAB — CMP (CANCER CENTER ONLY)
ALT: 9 U/L (ref 0–44)
AST: 18 U/L (ref 15–41)
Albumin: 3.1 g/dL — ABNORMAL LOW (ref 3.5–5.0)
Alkaline Phosphatase: 120 U/L (ref 38–126)
Anion gap: 9 (ref 5–15)
BUN: 14 mg/dL (ref 8–23)
CO2: 26 mmol/L (ref 22–32)
Calcium: 9.4 mg/dL (ref 8.9–10.3)
Chloride: 99 mmol/L (ref 98–111)
Creatinine: 0.81 mg/dL (ref 0.61–1.24)
GFR, Est AFR Am: >60 mL/min (ref >60)
GFR, Estimated: >60 mL/min (ref >60)
Glucose, Bld: 108 mg/dL — ABNORMAL HIGH (ref 70–99)
Potassium: 3.9 mmol/L (ref 3.5–5.1)
Sodium: 134 mmol/L — ABNORMAL LOW (ref 135–145)
Total Bilirubin: 0.6 mg/dL (ref 0.3–1.2)
Total Protein: 7.8 g/dL (ref 6.5–8.1)

## 2018-02-02 LAB — IRON AND TIBC
Iron: 114 ug/dL (ref 42–163)
Saturation Ratios: 38 % (ref 20–55)
TIBC: 303 ug/dL (ref 202–409)
UIBC: 189 ug/dL (ref 117–376)

## 2018-02-02 LAB — FERRITIN: Ferritin: 96 ng/mL (ref 24–336)

## 2018-02-02 LAB — LACTATE DEHYDROGENASE: LDH: 321 U/L — ABNORMAL HIGH (ref 98–192)

## 2018-02-02 MED ORDER — HYDROXYUREA 500 MG PO CAPS: 500.0000 mg | ORAL_CAPSULE | Freq: Two times a day (BID) | ORAL | 2 refills | Status: DC

## 2018-02-02 NOTE — Telephone Encounter (Signed)
Called [facility] to assist with scheduling of the patient current appointment Printed avs and calender of upcoming appointment. Per 1/15 los

## 2018-02-07 ENCOUNTER — Telehealth (HOSPITAL_COMMUNITY): Payer: Self-pay

## 2018-02-07 NOTE — Telephone Encounter (Signed)
Called and spoke with pt wife [name], who stated pt will like to participate in CR.  Patient will come in for orientation on 03/03/2018 @ 830AM and will attend the 945AM exercise class. Went over [employer], she verbalized understanding.  Mailed homework package.

## 2018-02-09 ENCOUNTER — Encounter: Payer: Self-pay | Admitting: Cardiology

## 2018-02-09 ENCOUNTER — Ambulatory Visit (INDEPENDENT_AMBULATORY_CARE_PROVIDER_SITE_OTHER): Payer: BLUE CROSS/BLUE SHIELD | Admitting: Cardiology

## 2018-02-09 VITALS — BP 127/71 | HR 97 | Ht 68.0 in | Wt 194.4 lb

## 2018-02-09 DIAGNOSIS — Z9889 Other specified postprocedural states: Secondary | ICD-10-CM | POA: Insufficient documentation

## 2018-02-09 DIAGNOSIS — I519 Heart disease, unspecified: Secondary | ICD-10-CM

## 2018-02-09 DIAGNOSIS — I2510 Atherosclerotic heart disease of native coronary artery without angina pectoris: Secondary | ICD-10-CM | POA: Diagnosis not present

## 2018-02-09 DIAGNOSIS — Z952 Presence of prosthetic heart valve: Secondary | ICD-10-CM | POA: Diagnosis not present

## 2018-02-09 DIAGNOSIS — I2583 Coronary atherosclerosis due to lipid rich plaque: Secondary | ICD-10-CM

## 2018-02-09 DIAGNOSIS — Z951 Presence of aortocoronary bypass graft: Secondary | ICD-10-CM | POA: Diagnosis not present

## 2018-02-09 DIAGNOSIS — Z79899 Other long term (current) drug therapy: Secondary | ICD-10-CM

## 2018-02-09 DIAGNOSIS — I4819 Other persistent atrial fibrillation: Secondary | ICD-10-CM | POA: Diagnosis not present

## 2018-02-09 MED ORDER — ROSUVASTATIN CALCIUM 20 MG PO TABS: 20.0000 mg | ORAL_TABLET | Freq: Every day | ORAL | 3 refills | Status: DC

## 2018-02-09 MED ORDER — METOPROLOL SUCCINATE ER 25 MG PO TB24: 25.0000 mg | ORAL_TABLET | Freq: Every day | ORAL | 3 refills | Status: DC

## 2018-02-09 NOTE — Patient Instructions (Signed)
Medication Instructions:  STOP METOPROLOL TARTRATE  START METOPROLOL SUCCINATE 25 MG DAILY  STOP ATORVASTATIN  START ROSUVASTATIN 20 MG DAILY  If you need a refil l on your cardiac medications before your next appointment, please call your pharmacy.   Lab work:  None ordered at this time   If you have labs (blood work) drawn today and your tests are completely normal, you will receive your results only by: Marland Kitchen MyChart Message (if you have MyChart) OR . A paper copy in the mail If you have any lab test that is abnormal or we need to change your treatment, we will call you to review the results.  Testing/Procedures:  None ordered at this time   Follow-Up: At [facility], you and your health needs are our priority.  As part of our continuing mission to provide you with exceptional heart care, we have created designated Provider Care Teams.  These Care Teams include your primary Cardiologist (physician) and Advanced Practice Providers (APPs -  Physician Assistants and Nurse Practitioners) who all work together to provide you with the care you need, when you need it. You will need a follow up appointment in 3 months.  Please call our office 2 months in advance to schedule this appointment.  You may see Buford Dresser, MD or one of the following Advanced Practice Providers on your designated Care Team:   Rosaria Ferries, PA-C . Jory Sims, DNP, ANP  Any Other Special Instructions Will Be Listed Below (If Applicable).

## 2018-02-09 NOTE — Progress Notes (Signed)
Cardiology Office Note:    Date:  02/09/2018   ID:  Justin [name], DOB [birth year], MRN [id number]  PCP:  [name], MD  Cardiologist:  Buford Dresser, MD PhD  Referring MD: [name], MD   CC: post op follow up  History of Present Illness:    Justin [name] is a 63 y.o. male with a hx of obesity, diabetes, tobacco use, hypertension, peripheral vascular disease who is seen in follow up for atrial fibrillation and s/p R CEA and CABG with AVR 01/03/18 for bicuspid aortic valve stenosis. My initial visit with him was on 12/07/17.   Today:  Overall doing well post op. Starting cardiac rehab 2/13. Appetite fair, but weight went from 238 lbs to 185 lbs on home scale. Working on diet, cut down on alcohol, slowly returning to exercise. No smoking since surgery. No issues with incision sites. Does have some confusions/questions on medications, reviewed extensively today.   Denies chest pain, SOB, PND, orthopnea, syncope, LE edema.   Past Medical History:  Diagnosis Date  . Acute meniscal tear of knee LEFT  . Aortic stenosis 12/01/2017   NONRHEUMATIC, AORTIC VALVE CALCIFICATIONS, MILD TO MODERATE REGURG, MILD TO MODERATE CALCIFIED ANNULUS per ECHO 10/25/17 @ [facility] [place]  . Arthritis   . Atrial fibrillation ([place]) 11/12/2017   AT O/V WITH PCP  . DM (diabetes mellitus) ([place])   . Heart murmur MILD-- ASYMPTOMATIC  . Hyperlipidemia   . Hypertension   . Left knee pain   . PAD (peripheral artery disease) (HCC)    left leg claudication    Past Surgical History:  Procedure Laterality Date  . AORTIC VALVE REPLACEMENT N/A 01/03/2018   Procedure: AORTIC VALVE REPLACEMENT (AVR) using 25mm Magna Ease Bioprosthesis Aortic Valve;  Surgeon: Ivin Poot, MD;  Location: [place];  Service: Open Heart Surgery;  Laterality: N/A;  . APPENDECTOMY  1998  . CATARACT EXTRACTION W/ INTRAOCULAR LENS  IMPLANT, BILATERAL  1998/  2000  . CERVICAL FUSION  1985   C4 - 5  .  CORONARY ARTERY BYPASS GRAFT N/A 01/03/2018   Procedure: CORONARY ARTERY BYPASS GRAFTING (CABG) x 4 (LIMA to LAD, SVG to DIAGONAL, SVG to RAMUS INTERMEDIATE, and SVG to PDA), USING LEFT INFTERNAL MAMMARY ARTERY AND;  Surgeon: Prescott Gum, Collier Salina, MD;  Location: [place];  Service: Open Heart Surgery;  Laterality: N/A;  . ENDARTERECTOMY Right 01/03/2018   Procedure: ENDARTERECTOMY CAROTID;  Surgeon: Marty Heck, MD;  Location: [place];  Service: Vascular;  Laterality: Right;  . KNEE ARTHROSCOPY W/ MENISCECTOMY  1991   LEFT KNEE  . NASAL SINUS SURGERY  1982  . RIGHT/LEFT HEART CATH AND CORONARY ANGIOGRAPHY N/A 12/24/2017   Procedure: RIGHT/LEFT HEART CATH AND CORONARY ANGIOGRAPHY;  Surgeon: Jolaine Artist, MD;  Location: [place] [facility];  Service: Cardiovascular;  Laterality: N/A;  . ROTATOR CUFF REPAIR  09-04-2005   LEFT SHOULDER  . TEE WITHOUT CARDIOVERSION N/A 12/24/2017   Procedure: TRANSESOPHAGEAL ECHOCARDIOGRAM (TEE);  Surgeon: Jolaine Artist, MD;  Location: [facility] ENDOSCOPY;  Service: Cardiovascular;  Laterality: N/A;  . TEE WITHOUT CARDIOVERSION N/A 01/03/2018   Procedure: TRANSESOPHAGEAL ECHOCARDIOGRAM (TEE);  Surgeon: Prescott Gum, Collier Salina, MD;  Location: [place];  Service: Open Heart Surgery;  Laterality: N/A;  . TONSILLECTOMY  AGE 21    Current Medications: Current Outpatient Medications on File Prior to Visit  Medication Sig  . apixaban (ELIQUIS) 5 MG TABS tablet Take 1 tablet (5 mg total) by mouth 2 (two)  times daily.  Marland Kitchen aspirin 81 MG tablet Take 81 mg by mouth daily.   . Cholecalciferol 125 MCG (5000 UT) capsule Take 1 capsule by mouth daily.   . cyclobenzaprine (FLEXERIL) 10 MG tablet Take 10 mg by mouth 3 (three) times daily.   . fluticasone (FLONASE) 50 MCG/ACT nasal spray Place 2 sprays into both nostrils daily.  . hydroxyurea (HYDREA) 500 MG capsule Take 1 capsule (500 mg total) by mouth 2 (two) times daily for 30 days. May take with food to minimize GI side effects.  Marland Kitchen  loratadine (CLARITIN) 10 MG tablet Take 10 mg by mouth daily.  . metFORMIN (GLUCOPHAGE-XR) 500 MG 24 hr tablet Take 500 mg by mouth daily before supper.  . nefazodone (SERZONE) 100 MG tablet Take 100 mg by mouth 2 (two) times daily.  . pregabalin (LYRICA) 50 MG capsule Take 50 mg by mouth 3 (three) times daily.  Marland Kitchen zolpidem (AMBIEN) 10 MG tablet Take 10 mg by mouth at bedtime as needed.   No current facility-administered medications on file prior to visit.     Outpatient Encounter Medications as of 02/09/2018  Medication Sig  . apixaban (ELIQUIS) 5 MG TABS tablet Take 1 tablet (5 mg total) by mouth 2 (two) times daily.  Marland Kitchen aspirin 81 MG tablet Take 81 mg by mouth daily.   . Cholecalciferol 125 MCG (5000 UT) capsule Take 1 capsule by mouth daily.   . cyclobenzaprine (FLEXERIL) 10 MG tablet Take 10 mg by mouth 3 (three) times daily.   . fluticasone (FLONASE) 50 MCG/ACT nasal spray Place 2 sprays into both nostrils daily.  . hydroxyurea (HYDREA) 500 MG capsule Take 1 capsule (500 mg total) by mouth 2 (two) times daily for 30 days. May take with food to minimize GI side effects.  Marland Kitchen loratadine (CLARITIN) 10 MG tablet Take 10 mg by mouth daily.  . metFORMIN (GLUCOPHAGE-XR) 500 MG 24 hr tablet Take 500 mg by mouth daily before supper.  . nefazodone (SERZONE) 100 MG tablet Take 100 mg by mouth 2 (two) times daily.  . pregabalin (LYRICA) 50 MG capsule Take 50 mg by mouth 3 (three) times daily.  Marland Kitchen zolpidem (AMBIEN) 10 MG tablet Take 10 mg by mouth at bedtime as needed.  . [DISCONTINUED] furosemide (LASIX) 20 MG tablet TAKE 1 TABLET BY MOUTH FOR 4 DAYS THEN TAKE 1 TABLET BY MOUTH DAILY ONLY AS NEEDED FOR WEIGHT GAIN OF 3 LBS IN 1 DAY OR 5 LBS IN 1 WEEK  . [DISCONTINUED] metoprolol tartrate (LOPRESSOR) 25 MG tablet Take 0.5 tablets (12.5 mg total) by mouth 2 (two) times daily.  . metoprolol succinate (TOPROL XL) 25 MG 24 hr tablet Take 1 tablet (25 mg total) by mouth daily.  . rosuvastatin (CRESTOR) 20 MG  tablet Take 1 tablet (20 mg total) by mouth daily.  . [DISCONTINUED] atorvastatin (LIPITOR) 20 MG tablet Take 1 tablet (20 mg total) by mouth daily at 6 PM. (Patient not taking: Reported on 01/25/2018)  . [DISCONTINUED] ferrous sulfate 325 (65 FE) MG tablet Take 1 tablet (325 mg total) by mouth daily with breakfast. For one month then stop. If develops constipation, may stop sooner (Patient not taking: Reported on 02/09/2018)  . [DISCONTINUED] finasteride (PROSCAR) 5 MG tablet Take 1 tablet (5 mg total) by mouth daily.  . [DISCONTINUED] potassium chloride (K-DUR) 10 MEQ tablet Take 1 tablet (10 mEq total) by mouth daily. (Patient not taking: Reported on 02/02/2018)  . [DISCONTINUED] traMADol (ULTRAM) 50 MG tablet Take 50 mg  by mouth every 4-6 hours PRN severe pain (Patient not taking: Reported on 02/02/2018)   No facility-administered encounter medications on file as of 02/09/2018.    Allergies:   Codeine   Social History   Socioeconomic History  . Marital status: Married    Spouse name: Not on file  . Number of children: Not on file  . Years of education: Not on file  . Highest education level: Not on file  Occupational History  . Not on file  Social Needs  . Financial resource strain: Not on file  . Food insecurity:    Worry: Not on file    Inability: Not on file  . Transportation needs:    Medical: Not on file    Non-medical: Not on file  Tobacco Use  . Smoking status: Former Smoker    Packs/day: 0.25    Years: 23.00    Pack years: 5.75    Types: Cigars    Last attempt to quit: 01/02/2018    Years since quitting: 0.1  . Smokeless tobacco: Never Used  . Tobacco comment: quit cigs in 2011 and smokes cigars daily- from 3-10 cigars-09/27/13  Substance and Sexual Activity  . Alcohol use: Yes    Comment: OCCASIONAL  . Drug use: No  . Sexual activity: Not on file  Lifestyle  . Physical activity:    Days per week: Not on file    Minutes per session: Not on file  . Stress: Not on  file  Relationships  . Social connections:    Talks on phone: Not on file    Gets together: Not on file    Attends religious service: Not on file    Active member of club or organization: Not on file    Attends meetings of clubs or organizations: Not on file    Relationship status: Not on file  Other Topics Concern  . Not on file  Social History Narrative  . Not on file     Family History: Family history unknown as he was adopted.  ROS:   Please see the history of present illness.  Additional pertinent ROS:  Constitutional: Negative for chills, fever, night sweats, unintentional weight loss. Positive for fatigue HENT: Negative for ear pain and hearing loss.   Eyes: Negative for loss of vision and eye pain.  Respiratory: Negative for cough, sputum, shortness of breath, wheezing.   Cardiovascular: Negative for chest pain, palpitations, PND, orthopnea, lower extremity edema and claudication.  Gastrointestinal: Negative for abdominal pain, melena, and hematochezia.  Genitourinary: Negative for dysuria and hematuria.  Musculoskeletal: Negative for falls and myalgias.  Skin: Negative for itching and rash.  Neurological: Negative for focal weakness, focal sensory changes and loss of consciousness.  Endo/Heme/Allergies: Does not bruise/bleed easily.   EKGs/Labs/Other Studies Reviewed:    The following studies were reviewed today: Echo 10/25/17 - Left ventricle: Wall thickness was increased in a pattern of mild   LVH. Systolic function was mildly to moderately reduced. The   estimated ejection fraction was in the range of 40% to 45%. - Aortic valve: Mildly to moderately calcified annulus. Mildly   thickened, moderately calcified leaflets. There was mild to   moderate regurgitation. Valve area (VTI): 0.93 cm^2. Valve area   (Vmax): 0.93 cm^2. Valve area (Vmean): 0.9 cm^2. - Left atrium: The atrium was mildly dilated. - Right atrium: The atrium was mildly dilated.  TEE 12/24/17 -  Left ventricle: The estimated ejection fraction was 55%. - Aortic valve: Trileaflet; moderately calcified  leaflets; fusion   of the left-noncoronary commissure. There was moderate stenosis. - Mitral valve: No evidence of vegetation. There was mild to   moderate regurgitation. - Left atrium: The atrium was dilated. No evidence of thrombus in   the atrial cavity or appendage. - Right atrium: The atrium was dilated. - Atrial septum: No defect or patent foramen ovale was identified. - Tricuspid valve: No evidence of vegetation. - Pulmonic valve: No evidence of vegetation.  [facility] 12/24/17  Ost RCA to Prox RCA lesion is 95% stenosed.  Prox LAD lesion is 95% stenosed.  Mid RCA lesion is 80% stenosed.  Dist RCA lesion is 99% stenosed.  Post Atrio lesion is 50% stenosed.  Prox Cx to Mid Cx lesion is 80% stenosed.  Mid LM to Dist LM lesion is 50% stenosed.  Ost 1st Diag lesion is 90% stenosed.  There is moderate aortic valve stenosis.   Findings:  RA = 6 RV = 34/5 PA = 34/9 (20) PCW = 21 Fick cardiac output/index = 5.5/2.7 PVR = < 1.0 WU Ao sat = 97% PA sat = 73%, 74%  Assessment: 1. Moderate aortic stenosis 2. Severe 3v CAD 3. Normal LV function 4. Relatively normal hemodynamics Plan/Discussion: Films reviewed with Dr. Prescott Gum. Plan AVR/CABG +/- [place] with Dr. Prescott Gum next week.   Surgery 01/03/18 Procedure (s):  1. Coronary artery bypass grafting x4 (left internal mammary artery to left anterior descending, saphenous vein graft to diagonal, saphenous vein graft to OM1, saphenous vein graft to posterior descending). 2. Aortic valve replacement with a 23 mm Edwards pericardial valve, serial [id number]. 3. Application of left atrial clip, AtriCure 35 mm. 4. Endoscopic harvest of right leg greater saphenous vein by Dr. Prescott Gum on 01/03/2018.  Also R CEA with bovine patch angioplasty  EKG:  EKG is personally reviewed.  The ekg ordered 01/21/18 demonstrates  atrial fibrillation  Recent Labs: 01/04/2018: Magnesium 2.0 02/02/2018: ALT 9; BUN 14; Creatinine 0.81; Hemoglobin 10.9; Platelet Count 312; Potassium 3.9; Sodium 134  Recent Lipid Panel No results found for: CHOL, TRIG, HDL, CHOLHDL, VLDL, LDLCALC, LDLDIRECT  Physical Exam:    VS:  BP 127/71   Pulse 97   Ht 5\' 8"  (1.727 m)   Wt 194 lb 6.4 oz (88.2 kg)   BMI 29.56 kg/m     Wt Readings from Last 3 Encounters:  02/09/18 194 lb 6.4 oz (88.2 kg)  02/02/18 192 lb 4.8 oz (87.2 kg)  01/25/18 191 lb (86.6 kg)     GEN: Well nourished, well developed in no acute distress HEENT: Normal NECK: No JVD; No carotid bruits LYMPHATICS: No lymphadenopathy CARDIAC: irregular rhythm, normal S1 and S2, no rubs, gallops. No appreciable murmur. Radial pulses 2+ bilaterally. Unable to palpate LE pulses bilaterally, but they are warm RESPIRATORY:  Clear to auscultation without rales, wheezing or rhonchi  ABDOMEN: Soft, non-tender, non-distended MUSCULOSKELETAL:  No edema; No deformity  SKIN: Warm and dry NEUROLOGIC:  Alert and oriented x 3 PSYCHIATRIC:  Normal affect   ASSESSMENT:    1. S/P CABG x 4   2. Persistent atrial fibrillation   3. Coronary artery disease due to lipid rich plaque   4. S/P AVR (aortic valve replacement)   5. History of CEA (carotid endarterectomy)   6. Left ventricular systolic dysfunction without heart failure   7. Medication management    PLAN:    1. S/P R CEA, CAD s/p CABG x4, AVR (for bicuspid AV stenosis) 12/2017:   -cardiac rehab pending  -  on beta blockers, changed to long acting succinate given systolic dysfunction  -on 81 mg aspirin given need for anticoagulation  -was on atorvastatin 20 mg, though ran out. Want a high intensity does. Due to potential interaction of atorvastatin 40 mg with nefazodone, changed to rosuvastatin 20 mg today to get high intensity statin without interaction risk.  2. Atrial fibrillation: rate controlled, does not notice  palpitations. Chadsvasc=3, s/p LA clip. His polycythemia also places him at higher risk of clot as well.  -Continue rate control with metoprolol, anticoagulation with apixaban   Not discussed in depth today:  Likely sleep apnea: Epworth sleepiness score=12. High likelihood of sleep apnea given body habitus and comorbidities.   -pending sleep study  Claudication:  -quit smoking. On aspirin and statin. ABIs showed moderate disease on the right, severe disease on the left. Pending cardiac rehab. Continue to follow  Hypertension: at goal today. On metoprolol only. Would consider adding ACEI given diabetes, vascular disease, if LV dysfunction does not improve 3 mos post revascularization.  Secondary Prevention -recommend heart healthy/Mediterranean diet, with whole grains, fruits, vegetable, fish, lean meats, nuts, and olive oil. Limit salt. -recommend moderate walking, 3-5 times/week for 30-50 minutes each session. Aim for at least 150 minutes.week. Goal should be pace of 3 miles/hours, or walking 1.5 miles in 30 minutes -recommend avoidance of tobacco products. Avoid excess alcohol. -Additional risk factor control:  -Diabetes: A1c is 6.5. Consider SGLT2 inhibitor at next visit  -Lipids:recheck at next visit  -Blood pressure control: as above  -Weight: BMI 30. Currently losing weight  Plan for follow up: 3 mos  TIME SPENT WITH PATIENT: >25 minutes of direct patient care. More than 50% of that time was spent on coordination of care and counseling regarding medication management and goals.  Medication Adjustments/Labs and Tests Ordered: Current medicines are reviewed at length with the patient today.  Concerns regarding medicines are outlined above.  No orders of the defined types were placed in this encounter.  Meds ordered this encounter  Medications  . metoprolol succinate (TOPROL XL) 25 MG 24 hr tablet    Sig: Take 1 tablet (25 mg total) by mouth daily.    Dispense:  90 tablet     Refill:  3  . rosuvastatin (CRESTOR) 20 MG tablet    Sig: Take 1 tablet (20 mg total) by mouth daily.    Dispense:  90 tablet    Refill:  3    Patient Instructions  Medication Instructions:  STOP METOPROLOL TARTRATE  START METOPROLOL SUCCINATE 25 MG DAILY  STOP ATORVASTATIN  START ROSUVASTATIN 20 MG DAILY  If you need a refil l on your cardiac medications before your next appointment, please call your pharmacy.   Lab work:  None ordered at this time   If you have labs (blood work) drawn today and your tests are completely normal, you will receive your results only by: Marland Kitchen MyChart Message (if you have MyChart) OR . A paper copy in the mail If you have any lab test that is abnormal or we need to change your treatment, we will call you to review the results.  Testing/Procedures:  None ordered at this time   Follow-Up: At [facility], you and your health needs are our priority.  As part of our continuing mission to provide you with exceptional heart care, we have created designated Provider Care Teams.  These Care Teams include your primary Cardiologist (physician) and Advanced Practice Providers (APPs -  Physician Assistants and Nurse  Practitioners) who all work together to provide you with the care you need, when you need it. You will need a follow up appointment in 3 months.  Please call our office 2 months in advance to schedule this appointment.  You may see Buford Dresser, MD or one of the following Advanced Practice Providers on your designated Care Team:   Rosaria Ferries, PA-C . Jory Sims, DNP, ANP  Any Other Special Instructions Will Be Listed Below (If Applicable).       Signed, Buford Dresser, MD PhD 02/09/2018 10:36 PM    [place]

## 2018-02-11 ENCOUNTER — Telehealth (HOSPITAL_COMMUNITY): Payer: Self-pay

## 2018-02-14 ENCOUNTER — Other Ambulatory Visit: Payer: Self-pay | Admitting: Cardiothoracic Surgery

## 2018-02-14 DIAGNOSIS — Z951 Presence of aortocoronary bypass graft: Secondary | ICD-10-CM

## 2018-02-14 DIAGNOSIS — Z952 Presence of prosthetic heart valve: Secondary | ICD-10-CM

## 2018-02-15 ENCOUNTER — Encounter: Payer: Self-pay | Admitting: Cardiothoracic Surgery

## 2018-02-15 ENCOUNTER — Ambulatory Visit: Payer: Self-pay | Admitting: Hematology

## 2018-02-15 ENCOUNTER — Ambulatory Visit (INDEPENDENT_AMBULATORY_CARE_PROVIDER_SITE_OTHER): Payer: Self-pay | Admitting: Cardiothoracic Surgery

## 2018-02-15 ENCOUNTER — Other Ambulatory Visit: Payer: Self-pay

## 2018-02-15 ENCOUNTER — Ambulatory Visit
Admission: RE | Admit: 2018-02-15 | Discharge: 2018-02-15 | Disposition: A | Discharge status: Home / Self Care | Payer: BLUE CROSS/BLUE SHIELD | Source: Ambulatory Visit | Attending: Cardiothoracic Surgery | Admitting: Cardiothoracic Surgery

## 2018-02-15 VITALS — BP 130/64 | Resp 16 | Ht 68.0 in | Wt 186.0 lb

## 2018-02-15 DIAGNOSIS — Z952 Presence of prosthetic heart valve: Secondary | ICD-10-CM

## 2018-02-15 DIAGNOSIS — Z951 Presence of aortocoronary bypass graft: Secondary | ICD-10-CM

## 2018-02-15 DIAGNOSIS — Z954 Presence of other heart-valve replacement: Secondary | ICD-10-CM | POA: Diagnosis not present

## 2018-02-15 NOTE — Progress Notes (Signed)
PCP is [name], MD Referring Provider is End, Harrell Gave, MD  Chief Complaint  Patient presents with  . Routine Post Op    s/p CABG/AVR 01/03/18 with a CXR    HPI: Patient returns for first postop surgical check after combined AVR, CABG x4, left atrial clip, right carotid endarterectomy.  He has had a slow recovery with persistent fatigue.  He is not ambulating as much as he should and he is scheduled to start phase 2 cardiac rehab in early February.  Chest x-ray today is clear.  Sternal wires are intact.  Sternal incision is healing well.  He has no symptoms of angina or CHF.  He was recently evaluated by his cardiologist Dr. Harrell Gave who adjusted his medications.  He has comorbid medical problems including hematologic disorder on chronic hydroxyurea.  Patient has been on apixaban for his chronic atrial fibrillation.  Past Medical History:  Diagnosis Date  . Acute meniscal tear of knee LEFT  . Aortic stenosis 12/01/2017   NONRHEUMATIC, AORTIC VALVE CALCIFICATIONS, MILD TO MODERATE REGURG, MILD TO MODERATE CALCIFIED ANNULUS per ECHO 10/25/17 @ [facility] [place]  . Arthritis   . Atrial fibrillation ([place]) 11/12/2017   AT O/V WITH PCP  . DM (diabetes mellitus) ([place])   . Heart murmur MILD-- ASYMPTOMATIC  . Hyperlipidemia   . Hypertension   . Left knee pain   . PAD (peripheral artery disease) ([facility])    left leg claudication    Past Surgical History:  Procedure Laterality Date  . AORTIC VALVE REPLACEMENT N/A 01/03/2018   Procedure: AORTIC VALVE REPLACEMENT (AVR) using 1mm Magna Ease Bioprosthesis Aortic Valve;  Surgeon: Ivin Poot, MD;  Location: [place];  Service: Open Heart Surgery;  Laterality: N/A;  . APPENDECTOMY  1998  . CATARACT EXTRACTION W/ INTRAOCULAR LENS  IMPLANT, BILATERAL  1998/  2000  . CERVICAL FUSION  1985   C4 - 5  . CORONARY ARTERY BYPASS GRAFT N/A 01/03/2018   Procedure: CORONARY ARTERY BYPASS GRAFTING (CABG) x 4 (LIMA to LAD, SVG to DIAGONAL, SVG  to RAMUS INTERMEDIATE, and SVG to PDA), USING LEFT INFTERNAL MAMMARY ARTERY AND;  Surgeon: Prescott Gum, Collier Salina, MD;  Location: [place];  Service: Open Heart Surgery;  Laterality: N/A;  . ENDARTERECTOMY Right 01/03/2018   Procedure: ENDARTERECTOMY CAROTID;  Surgeon: Marty Heck, MD;  Location: [place];  Service: Vascular;  Laterality: Right;  . KNEE ARTHROSCOPY W/ MENISCECTOMY  1991   LEFT KNEE  . NASAL SINUS SURGERY  1982  . RIGHT/LEFT HEART CATH AND CORONARY ANGIOGRAPHY N/A 12/24/2017   Procedure: RIGHT/LEFT HEART CATH AND CORONARY ANGIOGRAPHY;  Surgeon: Jolaine Artist, MD;  Location: [place] [facility];  Service: Cardiovascular;  Laterality: N/A;  . ROTATOR CUFF REPAIR  09-04-2005   LEFT SHOULDER  . TEE WITHOUT CARDIOVERSION N/A 12/24/2017   Procedure: TRANSESOPHAGEAL ECHOCARDIOGRAM (TEE);  Surgeon: Jolaine Artist, MD;  Location: [facility];  Service: Cardiovascular;  Laterality: N/A;  . TEE WITHOUT CARDIOVERSION N/A 01/03/2018   Procedure: TRANSESOPHAGEAL ECHOCARDIOGRAM (TEE);  Surgeon: Prescott Gum, Collier Salina, MD;  Location: [place];  Service: Open Heart Surgery;  Laterality: N/A;  . TONSILLECTOMY  AGE 55    Family History  Adopted: Yes  Problem Relation Age of Onset  . Cancer Father     Social History Social History   Tobacco Use  . Smoking status: Former Smoker    Packs/day: 0.25    Years: 23.00    Pack years: 5.75    Types: Cigars  Last attempt to quit: 01/02/2018    Years since quitting: 0.1  . Smokeless tobacco: Never Used  . Tobacco comment: quit cigs in 2011 and smokes cigars daily- from 3-10 cigars-09/27/13  Substance Use Topics  . Alcohol use: Yes    Comment: OCCASIONAL  . Drug use: No    Current Outpatient Medications  Medication Sig Dispense Refill  . apixaban (ELIQUIS) 5 MG TABS tablet Take 1 tablet (5 mg total) by mouth 2 (two) times daily.    Marland Kitchen aspirin 81 MG tablet Take 81 mg by mouth daily.     . Cholecalciferol 125 MCG (5000 UT) capsule Take 1  capsule by mouth daily.     . cyclobenzaprine (FLEXERIL) 10 MG tablet Take 10 mg by mouth 3 (three) times daily.     . fluticasone (FLONASE) 50 MCG/ACT nasal spray Place 2 sprays into both nostrils daily.    . hydroxyurea (HYDREA) 500 MG capsule Take 1 capsule (500 mg total) by mouth 2 (two) times daily for 30 days. May take with food to minimize GI side effects. 60 capsule 2  . loratadine (CLARITIN) 10 MG tablet Take 10 mg by mouth daily.    . metFORMIN (GLUCOPHAGE-XR) 500 MG 24 hr tablet Take 500 mg by mouth daily before supper.    . metoprolol succinate (TOPROL XL) 25 MG 24 hr tablet Take 1 tablet (25 mg total) by mouth daily. 90 tablet 3  . nefazodone (SERZONE) 100 MG tablet Take 100 mg by mouth 2 (two) times daily.    . pregabalin (LYRICA) 50 MG capsule Take 50 mg by mouth 3 (three) times daily.    . rosuvastatin (CRESTOR) 20 MG tablet Take 1 tablet (20 mg total) by mouth daily. 90 tablet 3  . zolpidem (AMBIEN) 10 MG tablet Take 10 mg by mouth at bedtime as needed.     No current facility-administered medications for this visit.     Allergies  Allergen Reactions  . Codeine Swelling    SWELLING REACTION UNSPECIFIED  Tolerates hydrocodone and oxycodone     Review of Systems   Patient's fatigue should improve with time, rehab, and optimization of his beta-blocker dosing. No fevers. He has lost considerable weight since surgery but he was obese prior to surgery. Surgical incisions all healing adequately.  BP 130/64 (BP Location: Left Arm, Patient Position: Sitting, Cuff Size: Large)   Resp 16   Ht 5\' 8"  (1.727 m)   Wt 186 lb (84.4 kg)   SpO2 97% Comment: ON RA  BMI 28.28 kg/m  Physical Exam      Exam    General- alert and comfortable    Neck- no JVD, no cervical adenopathy palpable, no carotid bruit   Lungs- clear without rales, wheezes   Cor- irregular rate and rhythm, no murmur , gallop   Abdomen- soft, non-tender   Extremities - warm, non-tender, minimal edema    Neuro- oriented, appropriate, no focal weakness   Diagnostic Tests: Chest x-ray images performed today personally reviewed. Normal postoperative changes. Impression: Has had slow recovery after major combined cardiac and vascular procedures with preoperative chronic atrial fibrillation, lung disease and polycythemia vera.  He needs to improve his conditioning and stamina with cardiac rehab.  He is not ready to return to work yet which would entail fairly strenuous [place] schedule [place] and heavy pushing lifting and pulling.  I will evaluate him after he has completed approximately 6 to 8 weeks of cardiac rehab to reassess his ability to return to  work.  Plan: Return in mid March for assessment of progress.  Patient not ready to return to work at this time.  Len Childs, MD [facility] [phone]

## 2018-02-16 ENCOUNTER — Encounter: Payer: Self-pay | Admitting: Cardiothoracic Surgery

## 2018-02-17 ENCOUNTER — Encounter: Payer: Self-pay | Admitting: Hematology

## 2018-02-17 ENCOUNTER — Inpatient Hospital Stay: Payer: BLUE CROSS/BLUE SHIELD

## 2018-02-17 ENCOUNTER — Inpatient Hospital Stay (HOSPITAL_BASED_OUTPATIENT_CLINIC_OR_DEPARTMENT_OTHER): Payer: BLUE CROSS/BLUE SHIELD | Admitting: Hematology

## 2018-02-17 VITALS — BP 135/74 | HR 95 | Temp 98.3°F | Resp 18 | Ht 68.0 in | Wt 191.4 lb

## 2018-02-17 DIAGNOSIS — D75839 Thrombocytosis, unspecified: Secondary | ICD-10-CM

## 2018-02-17 DIAGNOSIS — J90 Pleural effusion, not elsewhere classified: Secondary | ICD-10-CM | POA: Diagnosis not present

## 2018-02-17 DIAGNOSIS — Z79899 Other long term (current) drug therapy: Secondary | ICD-10-CM | POA: Diagnosis not present

## 2018-02-17 DIAGNOSIS — I4891 Unspecified atrial fibrillation: Secondary | ICD-10-CM | POA: Diagnosis not present

## 2018-02-17 DIAGNOSIS — Z7984 Long term (current) use of oral hypoglycemic drugs: Secondary | ICD-10-CM | POA: Diagnosis not present

## 2018-02-17 DIAGNOSIS — D45 Polycythemia vera: Secondary | ICD-10-CM | POA: Diagnosis not present

## 2018-02-17 DIAGNOSIS — Z7982 Long term (current) use of aspirin: Secondary | ICD-10-CM | POA: Diagnosis not present

## 2018-02-17 DIAGNOSIS — M129 Arthropathy, unspecified: Secondary | ICD-10-CM | POA: Diagnosis not present

## 2018-02-17 DIAGNOSIS — Z87891 Personal history of nicotine dependence: Secondary | ICD-10-CM | POA: Diagnosis not present

## 2018-02-17 DIAGNOSIS — I2510 Atherosclerotic heart disease of native coronary artery without angina pectoris: Secondary | ICD-10-CM | POA: Diagnosis not present

## 2018-02-17 DIAGNOSIS — I2583 Coronary atherosclerosis due to lipid rich plaque: Secondary | ICD-10-CM

## 2018-02-17 DIAGNOSIS — I739 Peripheral vascular disease, unspecified: Secondary | ICD-10-CM | POA: Diagnosis not present

## 2018-02-17 DIAGNOSIS — D473 Essential (hemorrhagic) thrombocythemia: Secondary | ICD-10-CM

## 2018-02-17 DIAGNOSIS — D72829 Elevated white blood cell count, unspecified: Secondary | ICD-10-CM | POA: Diagnosis not present

## 2018-02-17 DIAGNOSIS — I10 Essential (primary) hypertension: Secondary | ICD-10-CM | POA: Diagnosis not present

## 2018-02-17 DIAGNOSIS — F17290 Nicotine dependence, other tobacco product, uncomplicated: Secondary | ICD-10-CM | POA: Diagnosis not present

## 2018-02-17 DIAGNOSIS — Z7901 Long term (current) use of anticoagulants: Secondary | ICD-10-CM | POA: Diagnosis not present

## 2018-02-17 DIAGNOSIS — E119 Type 2 diabetes mellitus without complications: Secondary | ICD-10-CM | POA: Diagnosis not present

## 2018-02-17 DIAGNOSIS — E785 Hyperlipidemia, unspecified: Secondary | ICD-10-CM | POA: Diagnosis not present

## 2018-02-17 DIAGNOSIS — R7989 Other specified abnormal findings of blood chemistry: Secondary | ICD-10-CM | POA: Diagnosis not present

## 2018-02-17 LAB — CBC WITH DIFFERENTIAL (CANCER CENTER ONLY)
Abs Immature Granulocytes: 1.35 10*3/uL — ABNORMAL HIGH (ref 0.00–0.07)
Basophils Absolute: 0.5 10*3/uL — ABNORMAL HIGH (ref 0.0–0.1)
Basophils Relative: 3 %
Eosinophils Absolute: 0.2 10*3/uL (ref 0.0–0.5)
Eosinophils Relative: 1 %
HCT: 39.4 % (ref 39.0–52.0)
Hemoglobin: 12.4 g/dL — ABNORMAL LOW (ref 13.0–17.0)
Immature Granulocytes: 7 %
Lymphocytes Relative: 11 %
Lymphs Abs: 2.2 10*3/uL (ref 0.7–4.0)
MCH: 30.9 pg (ref 26.0–34.0)
MCHC: 31.5 g/dL (ref 30.0–36.0)
MCV: 98.3 fL (ref 80.0–100.0)
MONOS PCT: 7 %
Monocytes Absolute: 1.4 10*3/uL — ABNORMAL HIGH (ref 0.1–1.0)
Neutro Abs: 13.7 10*3/uL — ABNORMAL HIGH (ref 1.7–7.7)
Neutrophils Relative %: 71 %
Platelet Count: 466 10*3/uL — ABNORMAL HIGH (ref 150–400)
RBC: 4.01 MIL/uL — ABNORMAL LOW (ref 4.22–5.81)
RDW: 23.7 % — ABNORMAL HIGH (ref 11.5–15.5)
WBC Count: 19.3 10*3/uL — ABNORMAL HIGH (ref 4.0–10.5)
nRBC: 0.4 % — ABNORMAL HIGH (ref 0.0–0.2)

## 2018-02-17 LAB — LIPID PANEL
Cholesterol: 127 mg/dL (ref 0–200)
HDL: 41 mg/dL (ref >40)
LDL Cholesterol: 70 mg/dL (ref 0–99)
Total CHOL/HDL Ratio: 3.1 RATIO
Triglycerides: 80 mg/dL (ref <150)
VLDL: 16 mg/dL (ref 0–40)

## 2018-02-17 LAB — LACTATE DEHYDROGENASE: LDH: 406 U/L — ABNORMAL HIGH (ref 98–192)

## 2018-02-17 LAB — CMP (CANCER CENTER ONLY)
ALT: 8 U/L (ref 0–44)
AST: 16 U/L (ref 15–41)
Albumin: 4.1 g/dL (ref 3.5–5.0)
Alkaline Phosphatase: 104 U/L (ref 38–126)
Anion gap: 11 (ref 5–15)
BUN: 13 mg/dL (ref 8–23)
CO2: 28 mmol/L (ref 22–32)
Calcium: 10.3 mg/dL (ref 8.9–10.3)
Chloride: 97 mmol/L — ABNORMAL LOW (ref 98–111)
Creatinine: 0.81 mg/dL (ref 0.61–1.24)
GFR, Est AFR Am: >60 mL/min (ref >60)
GFR, Estimated: >60 mL/min (ref >60)
Glucose, Bld: 133 mg/dL — ABNORMAL HIGH (ref 70–99)
Potassium: 3.9 mmol/L (ref 3.5–5.1)
Sodium: 136 mmol/L (ref 135–145)
Total Bilirubin: 0.5 mg/dL (ref 0.3–1.2)
Total Protein: 8.3 g/dL — ABNORMAL HIGH (ref 6.5–8.1)

## 2018-02-17 MED ORDER — HYDROXYUREA 500 MG PO CAPS: ORAL_CAPSULE | ORAL | 1 refills | Status: DC

## 2018-02-17 NOTE — Progress Notes (Signed)
[facility] OFFICE PROGRESS NOTE  Patient Care Team: [name], MD as PCP - General (Family Medicine) Buford Dresser, MD as PCP - Cardiology (Cardiology)  HEME/ONC OVERVIEW: 1. Polycythemia vera, JAK2 V617 mutation+; high risk (age >48) -Previous patient of Dr. Audelia Hives  -11/2017: Hgb ~17; WBC ~30k w/ nl diff, plts ~500k; NGS showed JAK2 V617F mutation (83% allele frequency), CAL-R mutation (4% allele frequency); BCR/ALB negative; no bone marrow biopsy done  -11/2017 - present: Hydrea 577m BID -12/2017 - present: phlebotomy x 1   TREATMENT REGIMEN:  Phlebotomy, 12/2017 - present; most recent on 12/23/2017  Hydrea, 12/03/2017 - present   PERTINENT NON-HEM/ONC PROBLEMS: 1. CAD s/p CABG x 4 in 12/2017 2. Moderate to severe aortic stenosis s/p aortic valve replacement and left atrial clip in 12/2017   ASSESSMENT & PLAN:   Polycythemia vera, JAK2+; high risk  -Previous patient of Dr. RAudelia Hives no bone marrow bx at diagnosis -He is currently tolerating Hydrea well without significant side effects (see dose adjustment below) -Hct 39 today, no indication for phlebotomy  -Goal Hct < 45%   Thrombocytosis  -Secondary to PV -Plt 466k today, rising since last week -Given the rising plt count, I recommended increasing Hydrea to 10087mqAM and continue 50078mhs -While there is no evidence-based data to recommend a target platelet count for cytoreductive therapy, normalization of platelet count would be ideal, given the patient's cardiovascular history  Leukocytosis -Secondary to PV  -WBC improving slightly, 19.3k today -Patient denies any symptoms of infection -Hydrea dose adjustment as above  Smoking cessation -Patient quit smoking tobacco after his recent cardiac surgeries -I counseled the patient on importance of abstinence from tobacco products  Orders Placed This Encounter  Procedures  . CBC with Differential (Cancer Center Only)    Standing Status:    Future    Standing Expiration Date:   03/24/2019  . [facility]    Standing Status:   Future    Standing Expiration Date:   03/24/2019  . Lactate dehydrogenase    Standing Status:   Future    Standing Expiration Date:   03/24/2019    All questions were answered. The patient knows to call the clinic with any problems, questions or concerns. No barriers to learning was detected.  A total of more than 25 minutes were spent face-to-face with the patient during this encounter and over half of that time was spent on counseling and coordination of care as outlined above.   Return in 4 weeks for labs, possible phlebotomy and clinic appt.   [name] 02/17/2018 11:05 AM  CHIEF COMPLAINT: "I am doing fine"  INTERVAL HISTORY: Mr. ParJerline Painturns to clinic for follow-up of polycythemia vera.  Patient reports that since last visit, he has been doing well, and [facility]comfort, nausea, vomiting, or diarrhea.  His CABG site is healing well.  He has not resumed tobacco use.  He denies any complaint today.  REVIEW OF SYSTEMS:   Constitutional: ( - ) fevers, ( - )  chills , ( - ) night sweats Eyes: ( - ) blurriness of vision, ( - ) double vision, ( - ) watery eyes Ears, nose, mouth, throat, and face: ( - ) mucositis, ( - ) sore throat Respiratory: ( - ) cough, ( - ) dyspnea, ( - ) wheezes Cardiovascular: ( - ) palpitation, ( - ) chest discomfort, ( - ) lower extremity swelling Gastrointestinal:  ( - )  nausea, ( - ) heartburn, ( - ) change in bowel habits Skin: ( - ) abnormal skin rashes Lymphatics: ( - ) new lymphadenopathy, ( - ) easy bruising Neurological: ( - ) numbness, ( - ) tingling, ( - ) new weaknesses Behavioral/Psych: ( - ) mood change, ( - ) new changes  All other systems were reviewed with the patient and are negative.  I have reviewed the past medical history, past surgical history, social history and family history with  the patient and they are unchanged from previous note.  ALLERGIES:  is allergic to codeine.  MEDICATIONS:  Current Outpatient Medications  Medication Sig Dispense Refill  . apixaban (ELIQUIS) 5 MG TABS tablet Take 1 tablet (5 mg total) by mouth 2 (two) times daily.    Marland Kitchen aspirin 81 MG tablet Take 81 mg by mouth daily.     . Cholecalciferol 125 MCG (5000 UT) capsule Take 1 capsule by mouth daily.     . cyclobenzaprine (FLEXERIL) 10 MG tablet Take 10 mg by mouth 3 (three) times daily.     . fluticasone (FLONASE) 50 MCG/ACT nasal spray Place 2 sprays into both nostrils daily.    . hydroxyurea (HYDREA) 500 MG capsule Take 2 capsules in the morning and one capsule at night. May take with food to minimize GI side effects. 90 capsule 1  . loratadine (CLARITIN) 10 MG tablet Take 10 mg by mouth daily.    . metFORMIN (GLUCOPHAGE-XR) 500 MG 24 hr tablet Take 500 mg by mouth daily before supper.    . metoprolol succinate (TOPROL XL) 25 MG 24 hr tablet Take 1 tablet (25 mg total) by mouth daily. 90 tablet 3  . nefazodone (SERZONE) 100 MG tablet Take 100 mg by mouth 2 (two) times daily.    . pregabalin (LYRICA) 50 MG capsule Take 50 mg by mouth 3 (three) times daily.    . rosuvastatin (CRESTOR) 20 MG tablet Take 1 tablet (20 mg total) by mouth daily. 90 tablet 3  . zolpidem (AMBIEN) 10 MG tablet Take 10 mg by mouth at bedtime as needed.     No current facility-administered medications for this visit.     PHYSICAL EXAMINATION: ECOG PERFORMANCE STATUS: 0 - Asymptomatic  Today's Vitals   02/17/18 1053 02/17/18 1054  BP: 135/74   Pulse: 95   Resp: 18   Temp: 98.3 F (36.8 C)   TempSrc: Oral   SpO2: 99%   Weight: 191 lb 6.4 oz (86.8 kg)   Height: _0  (1.727 m)   PainSc: 0-No pain 0-No pain   Body mass index is 29.1 kg/m.  Filed Weights   02/17/18 1053  Weight: 191 lb 6.4 oz (86.8 kg)    GENERAL: alert, no distress and comfortable, well appearing  SKIN: skin color, texture, turgor are  normal, no rashes or significant lesions EYES: conjunctiva are pink and non-injected, sclera clear OROPHARYNX: no exudate, no erythema; lips, buccal mucosa, and tongue normal  NECK: supple, non-tender LYMPH:  no palpable lymphadenopathy in the cervical or axillary  LUNGS: clear to auscultation and percussion with normal breathing effort HEART: regular rate & rhythm and no murmurs and no lower extremity edema ABDOMEN: soft, non-tender, non-distended, normal bowel sounds Musculoskeletal: no cyanosis of digits and no clubbing  PSYCH: alert & oriented x 3, fluent speech NEURO: no focal motor/sensory deficits  LABORATORY DATA:  I have reviewed the data as listed    Component Value Date/Time   NA 136 02/17/2018 1014  K 3.9 02/17/2018 1014   CL 97 (L) 02/17/2018 1014   CO2 28 02/17/2018 1014   GLUCOSE 133 (H) 02/17/2018 1014   BUN 13 02/17/2018 1014   CREATININE 0.81 02/17/2018 1014   CALCIUM 10.3 02/17/2018 1014   PROT 8.3 (H) 02/17/2018 1014   ALBUMIN 4.1 02/17/2018 1014   AST 16 02/17/2018 1014   ALT 8 02/17/2018 1014   ALKPHOS 104 02/17/2018 1014   BILITOT 0.5 02/17/2018 1014   GFRNONAA >60 02/17/2018 1014   GFRAA >60 02/17/2018 1014    No results found for: SPEP, UPEP  Lab Results  Component Value Date   WBC 19.3 (H) 02/17/2018   NEUTROABS 13.7 (H) 02/17/2018   HGB 12.4 (L) 02/17/2018   HCT 39.4 02/17/2018   MCV 98.3 02/17/2018   PLT 466 (H) 02/17/2018      Chemistry      Component Value Date/Time   NA 136 02/17/2018 1014   K 3.9 02/17/2018 1014   CL 97 (L) 02/17/2018 1014   CO2 28 02/17/2018 1014   BUN 13 02/17/2018 1014   CREATININE 0.81 02/17/2018 1014      Component Value Date/Time   CALCIUM 10.3 02/17/2018 1014   ALKPHOS 104 02/17/2018 1014   AST 16 02/17/2018 1014   ALT 8 02/17/2018 1014   BILITOT 0.5 02/17/2018 1014       RADIOGRAPHIC STUDIES: I have personally reviewed the radiological images as listed below and agreed with the findings in the  report. Dg Chest 2 View  Result Date: 02/15/2018 CLINICAL DATA:  Status post coronary bypass grafting and aortic valve replacement EXAM: CHEST - 2 VIEW COMPARISON:  01/08/2018 FINDINGS: Right-sided PICC line is been removed in the interval. Postsurgical changes are again seen. Aortic calcifications are noted. Cardiac shadow is at the upper limits of normal in size. The lungs are well aerated bilaterally without focal infiltrate or effusion. Mild degenerative changes of the thoracic spine are noted. IMPRESSION: No acute abnormality noted. Electronically Signed   By: Inez Catalina M.D.   On: 02/15/2018 09:39

## 2018-02-21 ENCOUNTER — Telehealth: Payer: Self-pay

## 2018-02-21 NOTE — Telephone Encounter (Signed)
Patient contacted and made aware of medication changes.  Advised patient that he would need to stop his aspirin but continue his Eliquis if restarted on Meloxicam.  Patient acknowledged receipt.

## 2018-02-21 NOTE — Telephone Encounter (Signed)
-----   Message from Ivin Poot, MD sent at 02/18/2018  4:58 PM EST ----- Will have to stop his aspirin ----- Message ----- From: Donnella Sham, RN Sent: 02/18/2018   3:41 PM EST To: Ivin Poot, MD  Hey Dr. Prescott Gum,  Justin [name] went to see his PCP today and wanted to be restarted on his Meloxicam for back pain.  PCP cautioned restarting it due to patient being on Eliquis.  Can he restart the Meloxicam while taking Eliquis?  Please advise.  Thanks,  Caryl Pina

## 2018-02-28 ENCOUNTER — Telehealth (HOSPITAL_COMMUNITY): Payer: Self-pay

## 2018-02-28 NOTE — Progress Notes (Signed)
Justin [name] 63 y.o. male DOB [birth year] MRN [id number]       Nutrition Screen Note  No diagnosis found. Past Medical History:  Diagnosis Date  . Acute meniscal tear of knee LEFT  . Aortic stenosis 12/01/2017   NONRHEUMATIC, AORTIC VALVE CALCIFICATIONS, MILD TO MODERATE REGURG, MILD TO MODERATE CALCIFIED ANNULUS per ECHO 10/25/17 @ [facility]  . Arthritis   . Atrial fibrillation ([place]) 11/12/2017   AT O/V WITH PCP  . DM (diabetes mellitus) ([place])   . Heart murmur MILD-- ASYMPTOMATIC  . Hyperlipidemia   . Hypertension   . Left knee pain   . PAD (peripheral artery disease) ([place])    left leg claudication   Meds reviewed.    Current Outpatient Medications (Endocrine & Metabolic):  .  metFORMIN (GLUCOPHAGE-XR) 500 MG 24 hr tablet, Take 500 mg by mouth daily before supper.  Current Outpatient Medications (Cardiovascular):  .  metoprolol succinate (TOPROL XL) 25 MG 24 hr tablet, Take 1 tablet (25 mg total) by mouth daily. .  rosuvastatin (CRESTOR) 20 MG tablet, Take 1 tablet (20 mg total) by mouth daily.  Current Outpatient Medications (Respiratory):  .  fluticasone (FLONASE) 50 MCG/ACT nasal spray, Place 2 sprays into both nostrils daily. Marland Kitchen  loratadine (CLARITIN) 10 MG tablet, Take 10 mg by mouth daily.  Current Outpatient Medications (Analgesics):  .  aspirin 81 MG tablet, Take 81 mg by mouth daily.   Current Outpatient Medications (Hematological):  .  apixaban (ELIQUIS) 5 MG TABS tablet, Take 1 tablet (5 mg total) by mouth 2 (two) times daily.  Current Outpatient Medications (Other):  Marland Kitchen  Cholecalciferol 125 MCG (5000 UT) capsule, Take 1 capsule by mouth daily.  .  cyclobenzaprine (FLEXERIL) 10 MG tablet, Take 10 mg by mouth 3 (three) times daily.  .  hydroxyurea (HYDREA) 500 MG capsule, Take 2 capsules in the morning and one capsule at night. May take with food to minimize GI side effects. .  nefazodone (SERZONE) 100 MG tablet, Take 100 mg by mouth 2 (two) times  daily. .  pregabalin (LYRICA) 50 MG capsule, Take 50 mg by mouth 3 (three) times daily. Marland Kitchen  zolpidem (AMBIEN) 10 MG tablet, Take 10 mg by mouth at bedtime as needed.   HT: Ht Readings from Last 1 Encounters:  02/17/18 5\' 8"  (1.727 m)    WT: Wt Readings from Last 5 Encounters:  02/17/18 191 lb 6.4 oz (86.8 kg)  02/15/18 186 lb (84.4 kg)  02/09/18 194 lb 6.4 oz (88.2 kg)  02/02/18 192 lb 4.8 oz (87.2 kg)  01/25/18 191 lb (86.6 kg)     BMI =  29.1  02/17/18  Current tobacco use? No  Quit:01/02/18      Labs:  Lipid Panel     Component Value Date/Time   CHOL 127 02/17/2018 1014   TRIG 80 02/17/2018 1014   HDL 41 02/17/2018 1014   CHOLHDL 3.1 02/17/2018 1014   VLDL 16 02/17/2018 1014   LDLCALC 70 02/17/2018 1014    Lab Results  Component Value Date   HGBA1C 5.9 (H) 12/30/2017   CBG (last 3)  No results for input(s): GLUCAP in the last 72 hours.  Nutrition Diagnosis ? Food-and nutrition-related knowledge deficit related to lack of exposure to information as related to diagnosis of: ? CVD ? DM ? Overweight  related to excessive energy intake as evidenced by a BMI =  29.1  02/17/18  Nutrition Goal(s):  ? To be determined  Plan:  Pt to attend nutrition classes ? Nutrition I ? Nutrition II ? Portion Distortion  ? Diabetes Blitz ? Diabetes Q & A Will provide client-centered nutrition education as part of interdisciplinary care.   Monitor and evaluate progress toward nutrition goal with team.  Laurina Bustle, MS, RD, LDN 02/28/2018 8:19 AM

## 2018-02-28 NOTE — Telephone Encounter (Signed)
Cardiac Rehab Medication Review by a Pharmacist  Does the patient  feel that his/her medications are working for him/her?  yes  Has the patient been experiencing any side effects to the medications prescribed?  no  Does the patient measure his/her own blood pressure or blood glucose at home?  no ; in office 127/72  Does the patient have any problems obtaining medications due to transportation or finances?   no  Understanding of regimen: good Understanding of indications: good Potential of compliance: good   Pharmacist comments: Patient reports he is doing well. No concerns. Patient decided to continue taking aspirin and not take meloxicam.   Isaias Sakai, Sherian Rein D PGY1 Pharmacy Resident  Phone [phone] 02/28/2018      4:29 PM

## 2018-03-03 ENCOUNTER — Encounter (HOSPITAL_COMMUNITY)
Admission: RE | Admit: 2018-03-03 | Discharge: 2018-03-03 | Disposition: A | Discharge status: Home / Self Care | Payer: BLUE CROSS/BLUE SHIELD | Source: Ambulatory Visit | Attending: Cardiology | Admitting: Cardiology

## 2018-03-03 VITALS — BP 108/72 | HR 77 | Ht 67.25 in | Wt 192.9 lb

## 2018-03-03 DIAGNOSIS — Z952 Presence of prosthetic heart valve: Secondary | ICD-10-CM

## 2018-03-03 DIAGNOSIS — Z951 Presence of aortocoronary bypass graft: Secondary | ICD-10-CM | POA: Diagnosis not present

## 2018-03-03 NOTE — Progress Notes (Signed)
Cardiac Individual Treatment Plan  Patient Details  Name: Justin [name] MRN: [id number] Date of Birth: [birth year] Referring Provider:     CARDIAC REHAB PHASE II ORIENTATION from 03/03/2018 in [place]  Referring Provider  Dr. Harrell Gave      Initial Encounter Date:    CARDIAC REHAB PHASE II ORIENTATION from 03/03/2018 in [place]  Date  03/03/18      Visit Diagnosis: S/P CABG x 4  S/P AVR (aortic valve replacement)  Patient's Home Medications on Admission:  Current Outpatient Medications:  .  apixaban (ELIQUIS) 5 MG TABS tablet, Take 1 tablet (5 mg total) by mouth 2 (two) times daily., Disp: , Rfl:  .  aspirin 81 MG tablet, Take 81 mg by mouth daily. , Disp: , Rfl:  .  Cholecalciferol 125 MCG (5000 UT) capsule, Take 1 capsule by mouth daily. , Disp: , Rfl:  .  cyclobenzaprine (FLEXERIL) 10 MG tablet, Take 10 mg by mouth 3 (three) times daily. , Disp: , Rfl:  .  fluticasone (FLONASE) 50 MCG/ACT nasal spray, Place 2 sprays into both nostrils daily., Disp: , Rfl:  .  hydroxyurea (HYDREA) 500 MG capsule, Take 2 capsules in the morning and one capsule at night. May take with food to minimize GI side effects., Disp: 90 capsule, Rfl: 1 .  loratadine (CLARITIN) 10 MG tablet, Take 10 mg by mouth daily., Disp: , Rfl:  .  metFORMIN (GLUCOPHAGE-XR) 500 MG 24 hr tablet, Take 500 mg by mouth daily before supper., Disp: , Rfl:  .  metoprolol succinate (TOPROL XL) 25 MG 24 hr tablet, Take 1 tablet (25 mg total) by mouth daily., Disp: 90 tablet, Rfl: 3 .  nefazodone (SERZONE) 100 MG tablet, Take 100 mg by mouth 2 (two) times daily., Disp: , Rfl:  .  pregabalin (LYRICA) 50 MG capsule, Take 50 mg by mouth 3 (three) times daily., Disp: , Rfl:  .  rosuvastatin (CRESTOR) 20 MG tablet, Take 1 tablet (20 mg total) by mouth daily., Disp: 90 tablet, Rfl: 3 .  zolpidem (AMBIEN) 10 MG tablet, Take 10 mg by mouth at bedtime as needed.,  Disp: , Rfl:   Past Medical History: Past Medical History:  Diagnosis Date  . Acute meniscal tear of knee LEFT  . Aortic stenosis 12/01/2017   NONRHEUMATIC, AORTIC VALVE CALCIFICATIONS, MILD TO MODERATE REGURG, MILD TO MODERATE CALCIFIED ANNULUS per ECHO 10/25/17 @ [facility]  . Arthritis   . Atrial fibrillation ([place]) 11/12/2017   AT O/V WITH PCP  . DM (diabetes mellitus) ([place])   . Heart murmur MILD-- ASYMPTOMATIC  . Hyperlipidemia   . Hypertension   . Left knee pain   . PAD (peripheral artery disease) ([facility])    left leg claudication    Tobacco Use: Social History   Tobacco Use  Smoking Status Former Smoker  . Packs/day: 0.25  . Years: 23.00  . Pack years: 5.75  . Types: Cigars  . Last attempt to quit: 01/02/2018  . Years since quitting: 0.1  Smokeless Tobacco Never Used  Tobacco Comment   quit cigs in 2011 and smokes cigars daily- from 3-10 cigars-09/27/13    Labs: Recent Review Flowsheet Data    Labs for ITP Cardiac and Pulmonary Rehab Latest Ref Rng & Units 01/05/2018 01/05/2018 01/06/2018 01/07/2018 02/17/2018   Cholestrol 0 - 200 mg/dL - - - - 127   LDLCALC 0 - 99 mg/dL - - - - 70  HDL >40 mg/dL - - - - 41   Trlycerides <150 mg/dL - - - - 80   Hemoglobin A1c 4.8 - 5.6 % - - - - -   PHART 7.350 - 7.450 - - - - -   PCO2ART 32.0 - 48.0 mmHg - - - - -   HCO3 20.0 - 28.0 mmol/L - - - - -   TCO2 22 - 32 mmol/L - - 30 - -   ACIDBASEDEF 0.0 - 2.0 mmol/L - - - - -   O2SAT % 63.5 67.1 84.4 93.8 -      Capillary Blood Glucose: Lab Results  Component Value Date   GLUCAP 94 01/10/2018   GLUCAP 84 01/10/2018   GLUCAP 102 (H) 01/09/2018   GLUCAP 114 (H) 01/09/2018   GLUCAP 103 (H) 01/09/2018     Exercise Target Goals: Exercise Program Goal: Individual exercise prescription set using results from initial 6 min walk test and THRR while considering  patient's activity barriers and safety.   Exercise Prescription Goal: Initial exercise prescription builds  to 30-45 minutes a day of aerobic activity, 2-3 days per week.  Home exercise guidelines will be given to patient during program as part of exercise prescription that the participant will acknowledge.  Activity Barriers & Risk Stratification: Activity Barriers & Cardiac Risk Stratification - 03/03/18 1056      Activity Barriers & Cardiac Risk Stratification   Activity Barriers  Back Problems;Other (comment);Muscular Weakness;Deconditioning;History of Falls    Comments  Rotator cuff (Old injury)     Cardiac Risk Stratification  High       6 Minute Walk: 6 Minute Walk    Row Name 03/03/18 1055         6 Minute Walk   Phase  Initial     Distance  1325 feet     Walk Time  6 minutes     # of Rest Breaks  1     MPH  2.5     METS  3.2     RPE  13     VO2 Peak  11.2     Symptoms  Yes (comment)     Comments  Claudication 7/10 left leg     Resting HR  77 bpm     Resting BP  108/72     Resting Oxygen Saturation   96 %     Exercise Oxygen Saturation  during 6 min walk  96 %     Max Ex. HR  108 bpm     Max Ex. BP  130/82     2 Minute Post BP  112/64        Oxygen Initial Assessment:   Oxygen Re-Evaluation:   Oxygen Discharge (Final Oxygen Re-Evaluation):   Initial Exercise Prescription: Initial Exercise Prescription - 03/03/18 1100      Date of Initial Exercise RX and Referring Provider   Date  03/03/18    Referring Provider  Dr. Harrell Gave    Expected Discharge Date  06/10/18      Bike   Level  0.5    Minutes  10    METs  2.11      NuStep   Level  2    SPM  75    Minutes  10    METs  3      Track   Laps  9    Minutes  10    METs  2.53      Prescription Details  Frequency (times per week)  3    Duration  Progress to 30 minutes of continuous aerobic without signs/symptoms of physical distress      Intensity   THRR 40-80% of Max Heartrate  62-125    Ratings of Perceived Exertion  11-13      Progression   Progression  Continue to progress workloads  to maintain intensity without signs/symptoms of physical distress.      Resistance Training   Training Prescription  Yes    Weight  3 lbs.     Reps  10-15       Perform Capillary Blood Glucose checks as needed.  Exercise Prescription Changes:   Exercise Comments:   Exercise Goals and Review: Exercise Goals    Row Name 03/03/18 1057             Exercise Goals   Increase Physical Activity  Yes       Intervention  Provide advice, education, support and counseling about physical activity/exercise needs.;Develop an individualized exercise prescription for aerobic and resistive training based on initial evaluation findings, risk stratification, comorbidities and participant's personal goals.       Expected Outcomes  Short Term: Attend rehab on a regular basis to increase amount of physical activity.       Increase Strength and Stamina  Yes       Intervention  Provide advice, education, support and counseling about physical activity/exercise needs.;Develop an individualized exercise prescription for aerobic and resistive training based on initial evaluation findings, risk stratification, comorbidities and participant's personal goals.       Expected Outcomes  Short Term: Increase workloads from initial exercise prescription for resistance, speed, and METs.       Able to understand and use rate of perceived exertion (RPE) scale  Yes       Intervention  Provide education and explanation on how to use RPE scale       Expected Outcomes  Short Term: Able to use RPE daily in rehab to express subjective intensity level;Long Term:  Able to use RPE to guide intensity level when exercising independently       Knowledge and understanding of Target Heart Rate Range (THRR)  Yes       Intervention  Provide education and explanation of THRR including how the numbers were predicted and where they are located for reference       Expected Outcomes  Short Term: Able to state/look up THRR;Long Term: Able  to use THRR to govern intensity when exercising independently;Short Term: Able to use daily as guideline for intensity in rehab       Able to check pulse independently  Yes       Intervention  Provide education and demonstration on how to check pulse in carotid and radial arteries.;Review the importance of being able to check your own pulse for safety during independent exercise       Expected Outcomes  Short Term: Able to explain why pulse checking is important during independent exercise;Long Term: Able to check pulse independently and accurately       Understanding of Exercise Prescription  Yes       Intervention  Provide education, explanation, and written materials on patient's individual exercise prescription       Expected Outcomes  Short Term: Able to explain program exercise prescription;Long Term: Able to explain home exercise prescription to exercise independently       Improve claudication pain toleration; Improve walking ability  Yes  Intervention  Participate in PAD/SET Rehab 2-3 days a week, walking at home as part of exercise prescription;Attend education sessions to aid in risk factor modification and understanding of disease process       Expected Outcomes  Short Term: Improve walking distance/time to onset of claudication pain;Long Term: Improve score of PAD questionnaires;Long Term: Improve walking ability and toleration to claudication          Exercise Goals Re-Evaluation :   Discharge Exercise Prescription (Final Exercise Prescription Changes):   Nutrition:  Target Goals: Understanding of nutrition guidelines, daily intake of sodium 1500mg , cholesterol 200mg , calories 30% from fat and 7% or less from saturated fats, daily to have 5 or more servings of fruits and vegetables.  Biometrics: Pre Biometrics - 03/03/18 1057      Pre Biometrics   Height  5' 7.25" (1.708 m)    Weight  87.5 kg    Waist Circumference  42.5 inches    Hip Circumference  43 inches     Waist to Hip Ratio  0.99 %    BMI (Calculated)  29.99    Triceps Skinfold  14 mm    % Body Fat  29.3 %    Grip Strength  29 kg    Flexibility  0 in    Single Leg Stand  10.75 seconds        Nutrition Therapy Plan and Nutrition Goals: Nutrition Therapy & Goals - 03/03/18 1108      Nutrition Therapy   Diet  heart healthy, carb modified      Personal Nutrition Goals   Nutrition Goal  Pt to identify and limit food sources of saturated fat, trans fat, refined carbohydrates and sodium     Personal Goal #2  Pt to build a healthy plate including vegetables, fruits, whole grains, and low-fat dairy products in a heart healthy meal plan    Personal Goal #3  Pt to eat a variety of non-starchy vegetables    Personal Goal #4  Pt able to name foods that affect blood glucose       Intervention Plan   Intervention  Prescribe, educate and counsel regarding individualized specific dietary modifications aiming towards targeted core components such as weight, hypertension, lipid management, diabetes, heart failure and other comorbidities.    Expected Outcomes  Short Term Goal: Understand basic principles of dietary content, such as calories, fat, sodium, cholesterol and nutrients.;Long Term Goal: Adherence to prescribed nutrition plan.       Nutrition Assessments: Nutrition Assessments - 03/03/18 1112      MEDFICTS Scores   Pre Score  36       Nutrition Goals Re-Evaluation:   Nutrition Goals Re-Evaluation:   Nutrition Goals Discharge (Final Nutrition Goals Re-Evaluation):   Psychosocial: Target Goals: Acknowledge presence or absence of significant depression and/or stress, maximize coping skills, provide positive support system. Participant is able to verbalize types and ability to use techniques and skills needed for reducing stress and depression.  Initial Review & Psychosocial Screening: Initial Psych Review & Screening - 03/03/18 1220      Initial Review   Current issues with   None Identified      Family Dynamics   Good Support System?  Yes   Pt lists his wife and family as sources of support.      Barriers   Psychosocial barriers to participate in program  There are no identifiable barriers or psychosocial needs.      Screening Interventions   Interventions  Encouraged to exercise       Quality of Life Scores: Quality of Life - 03/03/18 1058      Quality of Life   Select  Quality of Life      Quality of Life Scores   Health/Function Pre  25.43 %    Socioeconomic Pre  26.71 %    Psych/Spiritual Pre  27.21 %    Family Pre  24.75 %    GLOBAL Pre  25.09 %      Scores of 19 and below usually indicate a poorer quality of life in these areas.  A difference of  2-3 points is a clinically meaningful difference.  A difference of 2-3 points in the total score of the Quality of Life Index has been associated with significant improvement in overall quality of life, self-image, physical symptoms, and general health in studies assessing change in quality of life.  PHQ-9: Recent Review Flowsheet Data    Depression screen [place] 2/9 03/20/2014   Decreased Interest 0   Down, Depressed, Hopeless 0   PHQ - 2 Score 0     Interpretation of Total Score  Total Score Depression Severity:  1-4 = Minimal depression, 5-9 = Mild depression, 10-14 = Moderate depression, 15-19 = Moderately severe depression, 20-27 = Severe depression   Psychosocial Evaluation and Intervention:   Psychosocial Re-Evaluation:   Psychosocial Discharge (Final Psychosocial Re-Evaluation):   Vocational Rehabilitation: Provide vocational rehab assistance to qualifying candidates.   Vocational Rehab Evaluation & Intervention: Vocational Rehab - 03/03/18 1221      Initial Vocational Rehab Evaluation & Intervention   Assessment shows need for Vocational Rehabilitation  Yes       Education: Education Goals: Education classes will be provided on a weekly basis, covering required topics.  Participant will state understanding/return demonstration of topics presented.  Learning Barriers/Preferences: Learning Barriers/Preferences - 03/03/18 1100      Learning Barriers/Preferences   Learning Barriers  Sight    Learning Preferences  Written Material;Audio       Education Topics: Count Your Pulse:  -Group instruction provided by verbal instruction, demonstration, patient participation and written materials to support subject.  Instructors address importance of being able to find your pulse and how to count your pulse when at home without a heart monitor.  Patients get hands on experience counting their pulse with staff help and individually.   Heart Attack, Angina, and Risk Factor Modification:  -Group instruction provided by verbal instruction, video, and written materials to support subject.  Instructors address signs and symptoms of angina and heart attacks.    Also discuss risk factors for heart disease and how to make changes to improve heart health risk factors.   Functional Fitness:  -Group instruction provided by verbal instruction, demonstration, patient participation, and written materials to support subject.  Instructors address safety measures for doing things around the house.  Discuss how to get up and down off the floor, how to pick things up properly, how to safely get out of a chair without assistance, and balance training.   Meditation and Mindfulness:  -Group instruction provided by verbal instruction, patient participation, and written materials to support subject.  Instructor addresses importance of mindfulness and meditation practice to help reduce stress and improve awareness.  Instructor also leads participants through a meditation exercise.    Stretching for Flexibility and Mobility:  -Group instruction provided by verbal instruction, patient participation, and written materials to support subject.  Instructors lead participants through series of  stretches  that are designed to increase flexibility thus improving mobility.  These stretches are additional exercise for major muscle groups that are typically performed during regular warm up and cool down.   Hands Only CPR:  -Group verbal, video, and participation provides a basic overview of AHA guidelines for community CPR. Role-play of emergencies allow participants the opportunity to practice calling for help and chest compression technique with discussion of AED use.   Hypertension: -Group verbal and written instruction that provides a basic overview of hypertension including the most recent diagnostic guidelines, risk factor reduction with self-care instructions and medication management.    Nutrition I class: Heart Healthy Eating:  -Group instruction provided by PowerPoint slides, verbal discussion, and written materials to support subject matter. The instructor gives an explanation and review of the Therapeutic Lifestyle Changes diet recommendations, which includes a discussion on lipid goals, dietary fat, sodium, fiber, plant stanol/sterol esters, sugar, and the components of a well-balanced, healthy diet.   Nutrition II class: Lifestyle Skills:  -Group instruction provided by PowerPoint slides, verbal discussion, and written materials to support subject matter. The instructor gives an explanation and review of label reading, grocery shopping for heart health, heart healthy recipe modifications, and ways to make healthier choices when eating out.   Diabetes Question & Answer:  -Group instruction provided by PowerPoint slides, verbal discussion, and written materials to support subject matter. The instructor gives an explanation and review of diabetes co-morbidities, pre- and post-prandial blood glucose goals, pre-exercise blood glucose goals, signs, symptoms, and treatment of hypoglycemia and hyperglycemia, and foot care basics.   Diabetes Blitz:  -Group instruction provided by  PowerPoint slides, verbal discussion, and written materials to support subject matter. The instructor gives an explanation and review of the physiology behind type 1 and type 2 diabetes, diabetes medications and rational behind using different medications, pre- and post-prandial blood glucose recommendations and Hemoglobin A1c goals, diabetes diet, and exercise including blood glucose guidelines for exercising safely.    Portion Distortion:  -Group instruction provided by PowerPoint slides, verbal discussion, written materials, and food models to support subject matter. The instructor gives an explanation of serving size versus portion size, changes in portions sizes over the last 20 years, and what consists of a serving from each food group.   Stress Management:  -Group instruction provided by verbal instruction, video, and written materials to support subject matter.  Instructors review role of stress in heart disease and how to cope with stress positively.     Exercising on Your Own:  -Group instruction provided by verbal instruction, power point, and written materials to support subject.  Instructors discuss benefits of exercise, components of exercise, frequency and intensity of exercise, and end points for exercise.  Also discuss use of nitroglycerin and activating EMS.  Review options of places to exercise outside of rehab.  Review guidelines for sex with heart disease.   Cardiac Drugs I:  -Group instruction provided by verbal instruction and written materials to support subject.  Instructor reviews cardiac drug classes: antiplatelets, anticoagulants, beta blockers, and statins.  Instructor discusses reasons, side effects, and lifestyle considerations for each drug class.   Cardiac Drugs II:  -Group instruction provided by verbal instruction and written materials to support subject.  Instructor reviews cardiac drug classes: angiotensin converting enzyme inhibitors (ACE-I), angiotensin II  receptor blockers (ARBs), nitrates, and calcium channel blockers.  Instructor discusses reasons, side effects, and lifestyle considerations for each drug class.   Anatomy and Physiology of the Circulatory System:  Group  verbal and written instruction and models provide basic cardiac anatomy and physiology, with the coronary electrical and arterial systems. Review of: AMI, Angina, Valve disease, Heart Failure, Peripheral Artery Disease, Cardiac Arrhythmia, Pacemakers, and the ICD.   Other Education:  -Group or individual verbal, written, or video instructions that support the educational goals of the cardiac rehab program.   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   Knowledge Questionnaire Score: Knowledge Questionnaire Score - 03/03/18 1101      Knowledge Questionnaire Score   Pre Score  20/24       Core Components/Risk Factors/Patient Goals at Admission: Personal Goals and Risk Factors at Admission - 03/03/18 1106      Core Components/Risk Factors/Patient Goals on Admission    Weight Management  Yes;Weight Maintenance    Intervention  Weight Management: Develop a combined nutrition and exercise program designed to reach desired caloric intake, while maintaining appropriate intake of nutrient and fiber, sodium and fats, and appropriate energy expenditure required for the weight goal.;Weight Management: Provide education and appropriate resources to help participant work on and attain dietary goals.;Weight Management/Obesity: Establish reasonable short term and long term weight goals.    Admit Weight  192 lb 14.4 oz (87.5 kg)    Expected Outcomes  Short Term: Continue to assess and modify interventions until  short term weight is achieved;Long Term: Adherence to nutrition and physical activity/exercise program aimed toward attainment of established weight goal;Weight Maintenance: Understanding of the daily nutrition guidelines, which includes 25-35% calories from fat, 7% or less cal from saturated fats, less than 200mg  cholesterol, less than 1.5gm of sodium, & 5 or more servings of fruits and vegetables daily;Understanding recommendations for meals to include 15-35% energy as protein, 25-35% energy from fat, 35-60% energy from carbohydrates, less than 200mg  of dietary cholesterol, 20-35 gm of total fiber daily;Understanding of distribution of calorie intake throughout the day with the consumption of 4-5 meals/snacks    Tobacco Cessation  Yes    Intervention  Assist the participant in steps to quit. Provide individualized education and counseling about committing to Tobacco Cessation, relapse prevention, and pharmacological support that can be provided by physician.;Advice worker, assist with locating and accessing local/national Quit Smoking programs, and support quit date choice.    Expected Outcomes  Short Term: Will demonstrate readiness to quit, by selecting a quit date.    Diabetes  Yes    Intervention  Provide education about signs/symptoms and action to take for hypo/hyperglycemia.;Provide education about proper nutrition, including hydration, and aerobic/resistive exercise prescription along with prescribed medications to achieve blood glucose in normal ranges: Fasting glucose 65-99 mg/dL    Expected Outcomes  Short Term: Participant verbalizes understanding of the signs/symptoms and immediate care of hyper/hypoglycemia, proper foot care and importance of medication, aerobic/resistive exercise and nutrition plan for blood glucose control.;Long Term: Attainment of HbA1C < 7%.    Hypertension  Yes    Intervention  Provide education on lifestyle modifcations including regular physical  activity/exercise, weight management, moderate sodium restriction and increased consumption of fresh fruit, vegetables, and low fat dairy, alcohol moderation, and smoking cessation.;Monitor prescription use compliance.    Expected Outcomes  Short Term: Continued assessment and intervention until BP is < 140/76mm HG in hypertensive participants. < 130/84mm HG in  hypertensive participants with diabetes, heart failure or chronic kidney disease.;Long Term: Maintenance of blood pressure at goal levels.    Lipids  Yes    Intervention  Provide education and support for participant on nutrition & aerobic/resistive exercise along with prescribed medications to achieve LDL 70mg , HDL >40mg .    Expected Outcomes  Short Term: Participant states understanding of desired cholesterol values and is compliant with medications prescribed. Participant is following exercise prescription and nutrition guidelines.;Long Term: Cholesterol controlled with medications as prescribed, with individualized exercise RX and with personalized nutrition plan. Value goals: LDL < 70mg , HDL > 40 mg.    Stress  Yes    Intervention  Offer individual and/or small group education and counseling on adjustment to heart disease, stress management and health-related lifestyle change. Teach and support self-help strategies.;Refer participants experiencing significant psychosocial distress to appropriate mental health specialists for further evaluation and treatment. When possible, include family members and significant others in education/counseling sessions.    Expected Outcomes  Short Term: Participant demonstrates changes in health-related behavior, relaxation and other stress management skills, ability to obtain effective social support, and compliance with psychotropic medications if prescribed.;Long Term: Emotional wellbeing is indicated by absence of clinically significant psychosocial distress or social isolation.       Core Components/Risk  Factors/Patient Goals Review:    Core Components/Risk Factors/Patient Goals at Discharge (Final Review):    ITP Comments: ITP Comments    Row Name 03/03/18 1024           ITP Comments  Dr. Fransico Him, Medical Director          Comments: Patient attended orientation from [place] to 1033 to review rules and guidelines for program. Completed 6 minute walk test, Intitial ITP, and exercise prescription.  VSS. Telemetry-A Fib.  Asymptomatic.

## 2018-03-03 NOTE — Progress Notes (Signed)
Justin [name] 63 y.o. male DOB: [birth year] MRN: [id number]      Nutrition Note  1. S/P CABG x 4   2. S/P AVR (aortic valve replacement)    Past Medical History:  Diagnosis Date  . Acute meniscal tear of knee LEFT  . Aortic stenosis 12/01/2017   NONRHEUMATIC, AORTIC VALVE CALCIFICATIONS, MILD TO MODERATE REGURG, MILD TO MODERATE CALCIFIED ANNULUS per ECHO 10/25/17 @ [facility] [place]  . Arthritis   . Atrial fibrillation ([place]) 11/12/2017   AT O/V WITH PCP  . DM (diabetes mellitus) ([place])   . Heart murmur MILD-- ASYMPTOMATIC  . Hyperlipidemia   . Hypertension   . Left knee pain   . PAD (peripheral artery disease) ([place])    left leg claudication   Meds reviewed.   Current Outpatient Medications (Endocrine & Metabolic):  .  metFORMIN (GLUCOPHAGE-XR) 500 MG 24 hr tablet, Take 500 mg by mouth daily before supper.  Current Outpatient Medications (Cardiovascular):  .  metoprolol succinate (TOPROL XL) 25 MG 24 hr tablet, Take 1 tablet (25 mg total) by mouth daily. .  rosuvastatin (CRESTOR) 20 MG tablet, Take 1 tablet (20 mg total) by mouth daily.  Current Outpatient Medications (Respiratory):  .  fluticasone (FLONASE) 50 MCG/ACT nasal spray, Place 2 sprays into both nostrils daily. Marland Kitchen  loratadine (CLARITIN) 10 MG tablet, Take 10 mg by mouth daily.  Current Outpatient Medications (Analgesics):  .  aspirin 81 MG tablet, Take 81 mg by mouth daily.   Current Outpatient Medications (Hematological):  .  apixaban (ELIQUIS) 5 MG TABS tablet, Take 1 tablet (5 mg total) by mouth 2 (two) times daily.  Current Outpatient Medications (Other):  Marland Kitchen  Cholecalciferol 125 MCG (5000 UT) capsule, Take 1 capsule by mouth daily.  .  cyclobenzaprine (FLEXERIL) 10 MG tablet, Take 10 mg by mouth 3 (three) times daily.  .  hydroxyurea (HYDREA) 500 MG capsule, Take 2 capsules in the morning and one capsule at night. May take with food to minimize GI side effects. .  nefazodone (SERZONE) 100 MG tablet, Take  100 mg by mouth 2 (two) times daily. .  pregabalin (LYRICA) 50 MG capsule, Take 50 mg by mouth 3 (three) times daily. Marland Kitchen  zolpidem (AMBIEN) 10 MG tablet, Take 10 mg by mouth at bedtime as needed.   HT: Ht Readings from Last 1 Encounters:  03/03/18 5' 7.25" (1.708 m)    WT: Wt Readings from Last 5 Encounters:  03/03/18 192 lb 14.4 oz (87.5 kg)  02/17/18 191 lb 6.4 oz (86.8 kg)  02/15/18 186 lb (84.4 kg)  02/09/18 194 lb 6.4 oz (88.2 kg)  02/02/18 192 lb 4.8 oz (87.2 kg)     Body mass index is 29.99 kg/m.   Current tobacco use? No  Labs:  Lipid Panel     Component Value Date/Time   CHOL 127 02/17/2018 1014   TRIG 80 02/17/2018 1014   HDL 41 02/17/2018 1014   CHOLHDL 3.1 02/17/2018 1014   VLDL 16 02/17/2018 1014   LDLCALC 70 02/17/2018 1014    Lab Results  Component Value Date   HGBA1C 5.9 (H) 12/30/2017   CBG (last 3)  No results for input(s): GLUCAP in the last 72 hours.  Nutrition Note Spoke with pt. Nutrition plan and goals reviewed with pt. Pt is following Step 1 of the Therapeutic Lifestyle Changes diet. Pt wants to lose wt, has not made any changes currently. Heart healthy diabetic wt loss tips reviewed (label reading, how to  build a healthy plate, portion sizes, eating frequently across the day). 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 Discussed ways to navigate eating out and bringing quick grab and go shelf stable snacks with him. Pt has Type 2 Diabetes. Last A1c indicates blood glucose well-controlled. This Probation officer went over Diabetes Education test results. Pt checks CBG's 1 times a day. Fasting CBG's reportedly 110-135's mg/dL. Per discussion, pt does not use canned/convenience foods often. Pt does not add salt to food. Pt does not eat out frequently. Pt expressed understanding of the information reviewed. Pt aware of nutrition education classes offered and would like to attend nutrition  classes.  Nutrition Diagnosis ? Food-and nutrition-related knowledge deficit related to lack of exposure to information as related to diagnosis of: ? CVD ? Type 2 Diabetes ? Overweight  related to excessive energy intake as evidenced by a Body mass index is 29.99 kg/m.  Nutrition Intervention ? Pt's individual nutrition plan and goals reviewed with pt. ? Pt given handouts for: ? Nutrition I class ? Nutrition II class  Nutrition Goal(s):  ? Pt to identify and limit food sources of saturated fat, trans fat, refined carbohydrates and sodium  ? Pt to build a healthy plate including vegetables, fruits, whole grains, and low-fat dairy products in a heart healthy meal plan ? Pt to eat a variety of non-starchy vegetables ? Pt able to name foods that affect blood glucose   Plan:  ? Pt to attend nutrition classes:  ? Nutrition I ? Nutrition II ? Portion Distortion   ? Diabetes Blitz ? Diabetes Q & Ae determined ? Will provide client-centered nutrition education as part of interdisciplinary care ? Monitor and evaluate progress toward nutrition goal with team.   Laurina Bustle, MS, RD, LDN 03/03/2018 3:32 PM

## 2018-03-11 ENCOUNTER — Encounter (HOSPITAL_COMMUNITY)
Admission: RE | Admit: 2018-03-11 | Discharge: 2018-03-11 | Disposition: A | Discharge status: Home / Self Care | Payer: BLUE CROSS/BLUE SHIELD | Source: Ambulatory Visit | Attending: Cardiology | Admitting: Cardiology

## 2018-03-11 ENCOUNTER — Ambulatory Visit (HOSPITAL_COMMUNITY): Payer: Self-pay

## 2018-03-11 DIAGNOSIS — Z952 Presence of prosthetic heart valve: Secondary | ICD-10-CM

## 2018-03-11 DIAGNOSIS — Z951 Presence of aortocoronary bypass graft: Secondary | ICD-10-CM | POA: Diagnosis not present

## 2018-03-11 LAB — GLUCOSE, CAPILLARY
Glucose-Capillary: 149 mg/dL — ABNORMAL HIGH (ref 70–99)
Glucose-Capillary: 102 mg/dL — ABNORMAL HIGH (ref 70–99)

## 2018-03-11 NOTE — Progress Notes (Signed)
Daily Session Note  Patient Details  Name: Justin [name] MRN: [id number] Date of Birth: 1955-06-16 Referring Provider:     CARDIAC REHAB PHASE II ORIENTATION from 03/03/2018 in [place]  Referring Provider  Dr. Harrell Gave      Encounter Date: 03/11/2018  Check In: Session Check In - 03/11/18 1014      Check-In   Supervising physician immediately available to respond to emergencies  [facility] immediately available    Physician(s)  Dr. Lonny Prude    Location  [facility]    Staff Present  Barnet Pall, RN, Mosie Epstein, MS,ACSM CEP, Exercise Physiologist;[name] Kris Mouton, MS, Exercise Physiologist;Tara Karle Starch, RN, BSN    Medication changes reported      No    Fall or balance concerns reported     No    Tobacco Cessation  No Change    Warm-up and Cool-down  Performed as group-led instruction    Resistance Training Performed  Yes    VAD Patient?  No    PAD/SET Patient?  No      Pain Assessment   Currently in Pain?  No/denies    Pain Score  0-No pain    Multiple Pain Sites  No       Capillary Blood Glucose: Results for orders placed or performed during the hospital encounter of 03/11/18 (from the past 24 hour(s))  Glucose, capillary     Status: Abnormal   Collection Time: 03/11/18  9:44 AM  Result Value Ref Range   Glucose-Capillary 149 (H) 70 - 99 mg/dL  Glucose, capillary     Status: Abnormal   Collection Time: 03/11/18 10:39 AM  Result Value Ref Range   Glucose-Capillary 102 (H) 70 - 99 mg/dL      Social History   Tobacco Use  Smoking Status Former Smoker  . Packs/day: 0.25  . Years: 23.00  . Pack years: 5.75  . Types: Cigars  . Last attempt to quit: 01/02/2018  . Years since quitting: 0.1  Smokeless Tobacco Never Used  Tobacco Comment   quit cigs in 2011 and smokes cigars daily- from 3-10 cigars-09/27/13    Goals Met:  No report of cardiac concerns or symptoms  Goals Unmet:  Not  Applicable  Comments: Pt started cardiac rehab today.  Pt tolerated light exercise without difficulty. VSS, telemetry-Sinus Rhtyhm, with PVC's  Medication list reconciled. Pt denies barriers to medicaiton compliance. Justin [name] is somewhat deconditioned and complained of having claudication pain on the walking track. Patient was encouraged to rest when needed.Justin [name] had difficulty on the upright airdyne. Patient was switched to the recumbent bike. Exertional blood pressures noted in the 180's. Exit blood pressure 104/70. Heart rate 95. Will continue to monitor BP.  PSYCHOSOCIAL ASSESSMENT:  PHQ-0. Pt exhibits positive coping skills, hopeful outlook with supportive family. No psychosocial needs identified at this time, no psychosocial interventions necessary.    Pt enjoys riding his motorcycle.   Pt oriented to exercise equipment and routine.    Understanding verbalized.Barnet Pall, RN,BSN 03/11/2018 4:07 PM   Dr. Fransico Him is Medical Director for Cardiac Rehab at [facility].

## 2018-03-14 ENCOUNTER — Encounter (HOSPITAL_COMMUNITY)
Admission: RE | Admit: 2018-03-14 | Discharge: 2018-03-14 | Disposition: A | Discharge status: Home / Self Care | Payer: BLUE CROSS/BLUE SHIELD | Source: Ambulatory Visit | Attending: Cardiology | Admitting: Cardiology

## 2018-03-14 ENCOUNTER — Ambulatory Visit (HOSPITAL_COMMUNITY): Payer: Self-pay

## 2018-03-14 DIAGNOSIS — Z952 Presence of prosthetic heart valve: Secondary | ICD-10-CM

## 2018-03-14 DIAGNOSIS — Z951 Presence of aortocoronary bypass graft: Secondary | ICD-10-CM

## 2018-03-14 LAB — GLUCOSE, CAPILLARY
GLUCOSE-CAPILLARY: 112 mg/dL — AB (ref 70–99)
Glucose-Capillary: 128 mg/dL — ABNORMAL HIGH (ref 70–99)

## 2018-03-16 ENCOUNTER — Ambulatory Visit (HOSPITAL_COMMUNITY): Payer: Self-pay

## 2018-03-16 ENCOUNTER — Encounter (HOSPITAL_COMMUNITY)
Admission: RE | Admit: 2018-03-16 | Discharge: 2018-03-16 | Disposition: A | Discharge status: Home / Self Care | Payer: BLUE CROSS/BLUE SHIELD | Source: Ambulatory Visit | Attending: Cardiology | Admitting: Cardiology

## 2018-03-16 DIAGNOSIS — Z951 Presence of aortocoronary bypass graft: Secondary | ICD-10-CM | POA: Diagnosis not present

## 2018-03-16 DIAGNOSIS — Z952 Presence of prosthetic heart valve: Secondary | ICD-10-CM | POA: Diagnosis not present

## 2018-03-16 LAB — GLUCOSE, CAPILLARY: Glucose-Capillary: 121 mg/dL — ABNORMAL HIGH (ref 70–99)

## 2018-03-16 NOTE — Progress Notes (Signed)
Justin [name] 63 y.o. male Nutrition Note Spoke with pt. Nutrition Plan and Nutrition Survey goals reviewed with pt. Pt is following a Heart Healthy diet. Pt wants to begin eating more of a heart healthy diabetic diet. Heart healthy diabetic eating tips reviewed (label reading, how to build a healthy plate, portion sizes, eating frequently across the day, carbohydrate counting, eating carbs consistently across the day). Pt has T2DM, reports not checking his blood sugar regularly. Reviewed the importance and benefits of checking blood glucose regularly with patient and recommended he do so daily. Reviewed how to balance carbohydrates consistently across the day. Additionally discussed adding a snack after breakfast before coming in to exercise to ensure pt's blood sugar does not fall to low during exercise. Distributed handout on carb counting, and snack ideas to pt. Per discussion, pt does not use canned/convenience foods often. Pt does not add salt to food. Pt does not eat out frequently. Pt expressed understanding of the information reviewed. Pt aware of nutrition education classes offered and would like to attend nutrition classes.  Lab Results  Component Value Date   HGBA1C 5.9 (H) 12/30/2017    Wt Readings from Last 3 Encounters:  03/03/18 192 lb 14.4 oz (87.5 kg)  02/17/18 191 lb 6.4 oz (86.8 kg)  02/15/18 186 lb (84.4 kg)    Nutrition Diagnosis  Food-and nutrition-related knowledge deficit related to lack of exposure to information as related to diagnosis of: ? CVD ? Type 2 Diabetes  Overweight  related to excessive energy intake as evidenced by a Body mass index is 29.99 kg/m.  Nutrition Intervention ? Pt's individual nutrition plan reviewed with pt. ? Benefits of adopting Heart Healthy diet discussed when Medficts reviewed.   ? Pt given handouts for: ? diabetic snacks ? how to count carbohydrates ? breakfast recipes  Goal(s) ? Pt to identify and limit food sources of  saturated fat, trans fat, refined carbohydrates and sodium.  ? Pt to build a healthy plate including vegetables, fruits, whole grains, and low-fat dairy products in a heart healthy meal plan. ? Pt to eat a variety of non-starchy vegetables ? Pt able to name foods that affect blood glucose  Plan:   Pt to attend nutrition classes ? Nutrition I ? Nutrition II ? Portion Distortion   Will provide client-centered nutrition education as part of interdisciplinary care  Monitor and evaluate progress toward nutrition goal with team.    Laurina Bustle, MS, RD, LDN 03/16/2018 10:21 AM

## 2018-03-17 ENCOUNTER — Other Ambulatory Visit: Payer: Self-pay

## 2018-03-17 ENCOUNTER — Inpatient Hospital Stay: Payer: BLUE CROSS/BLUE SHIELD

## 2018-03-17 ENCOUNTER — Encounter: Payer: Self-pay | Admitting: Hematology

## 2018-03-17 ENCOUNTER — Inpatient Hospital Stay: Payer: BLUE CROSS/BLUE SHIELD | Attending: Hematology and Oncology | Admitting: Hematology

## 2018-03-17 ENCOUNTER — Telehealth: Payer: Self-pay | Admitting: Hematology

## 2018-03-17 VITALS — BP 156/69 | HR 99 | Temp 98.3°F | Resp 20 | Wt 193.0 lb

## 2018-03-17 DIAGNOSIS — Z79899 Other long term (current) drug therapy: Secondary | ICD-10-CM

## 2018-03-17 DIAGNOSIS — Z7901 Long term (current) use of anticoagulants: Secondary | ICD-10-CM | POA: Insufficient documentation

## 2018-03-17 DIAGNOSIS — D45 Polycythemia vera: Secondary | ICD-10-CM

## 2018-03-17 DIAGNOSIS — Z7984 Long term (current) use of oral hypoglycemic drugs: Secondary | ICD-10-CM | POA: Insufficient documentation

## 2018-03-17 DIAGNOSIS — Z7982 Long term (current) use of aspirin: Secondary | ICD-10-CM | POA: Diagnosis not present

## 2018-03-17 DIAGNOSIS — D72829 Elevated white blood cell count, unspecified: Secondary | ICD-10-CM | POA: Diagnosis not present

## 2018-03-17 DIAGNOSIS — D473 Essential (hemorrhagic) thrombocythemia: Secondary | ICD-10-CM

## 2018-03-17 DIAGNOSIS — D75839 Thrombocytosis, unspecified: Secondary | ICD-10-CM

## 2018-03-17 LAB — CMP (CANCER CENTER ONLY)
ALT: 8 U/L (ref 0–44)
ANION GAP: 10 (ref 5–15)
AST: 14 U/L — ABNORMAL LOW (ref 15–41)
Albumin: 4.2 g/dL (ref 3.5–5.0)
Alkaline Phosphatase: 94 U/L (ref 38–126)
BUN: 12 mg/dL (ref 8–23)
CO2: 30 mmol/L (ref 22–32)
Calcium: 10.4 mg/dL — ABNORMAL HIGH (ref 8.9–10.3)
Chloride: 99 mmol/L (ref 98–111)
Creatinine: 0.84 mg/dL (ref 0.61–1.24)
GFR, Est AFR Am: >60 mL/min (ref >60)
GFR, Estimated: >60 mL/min (ref >60)
Glucose, Bld: 122 mg/dL — ABNORMAL HIGH (ref 70–99)
Potassium: 4.2 mmol/L (ref 3.5–5.1)
Sodium: 139 mmol/L (ref 135–145)
Total Bilirubin: 0.6 mg/dL (ref 0.3–1.2)
Total Protein: 8.6 g/dL — ABNORMAL HIGH (ref 6.5–8.1)

## 2018-03-17 LAB — CBC WITH DIFFERENTIAL (CANCER CENTER ONLY)
Abs Immature Granulocytes: 0.43 10*3/uL — ABNORMAL HIGH (ref 0.00–0.07)
Basophils Absolute: 0.5 10*3/uL — ABNORMAL HIGH (ref 0.0–0.1)
Basophils Relative: 2 %
Eosinophils Absolute: 0.1 10*3/uL (ref 0.0–0.5)
Eosinophils Relative: 0 %
HEMATOCRIT: 37.4 % — AB (ref 39.0–52.0)
Hemoglobin: 11.9 g/dL — ABNORMAL LOW (ref 13.0–17.0)
Immature Granulocytes: 2 %
LYMPHS ABS: 2.2 10*3/uL (ref 0.7–4.0)
LYMPHS PCT: 11 %
MCH: 31.4 pg (ref 26.0–34.0)
MCHC: 31.8 g/dL (ref 30.0–36.0)
MCV: 98.7 fL (ref 80.0–100.0)
Monocytes Absolute: 1.1 10*3/uL — ABNORMAL HIGH (ref 0.1–1.0)
Monocytes Relative: 6 %
Neutro Abs: 15.2 10*3/uL — ABNORMAL HIGH (ref 1.7–7.7)
Neutrophils Relative %: 79 %
Platelet Count: 153 10*3/uL (ref 150–400)
RBC: 3.79 MIL/uL — ABNORMAL LOW (ref 4.22–5.81)
RDW: 20.1 % — ABNORMAL HIGH (ref 11.5–15.5)
WBC Count: 19.4 10*3/uL — ABNORMAL HIGH (ref 4.0–10.5)
nRBC: 0.1 % (ref 0.0–0.2)

## 2018-03-17 LAB — LACTATE DEHYDROGENASE: LDH: 258 U/L — ABNORMAL HIGH (ref 98–192)

## 2018-03-17 NOTE — Progress Notes (Signed)
[place] OFFICE PROGRESS NOTE  [name] Care Team: [name], MD as PCP - General (Family Medicine) Buford Dresser, MD as PCP - Cardiology (Cardiology)  HEME/ONC OVERVIEW: 1. 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 Audelia Hives  -11/2017: Hgb ~17; WBC ~30k w/ nl diff, plts ~500k; NGS showed JAK2 V617F mutation (83% allele frequency), CAL-R mutation (4% allele frequency); BCR/ALB negative; no bone marrow biopsy done at diagnosis  -11/2017 - present: Hydrea, see dosing below -12/2017 - present: phlebotomy x 1   TREATMENT REGIMEN:  Phlebotomy, 12/2017 - present; most recent on 12/23/2017  Hydrea, 12/03/2017 - present   PERTINENT NON-HEM/ONC PROBLEMS: 1. CAD s/p CABG x 4 in 12/2017 2. Moderate to severe aortic stenosis s/p aortic valve replacement and left atrial clip in 12/2017   ASSESSMENT & PLAN:   Polycythemia vera, JAK2+; high risk  -Previous [name] of Dr. Audelia Hives; bone marrow bx not done at diagnosis -He is currently tolerating Hydrea well except mild fatigue -See dosing adjustment of Hydrea below -Hct 37.4, no indication for phlebotomy; goal Hct < 45%   -On ASA 46m daily   Thrombocytosis  -Secondary to PV -Plt 153k today, down from 466k in end of 01/2018  -Due to Justin moderate fall in platelet count since increasing Justin dose of Hydrea to 5032mqAM/100041mhs, I will slightly reduce Justin dose to 500m46mD alternating with 500mg19m/1000mg 45m-Repeat labs in 2 weeks to monitor platelet count  Leukocytosis -Secondary to PV  -WBC stable, 19.4k today -[name] denies any symptoms of infection -Hydrea dose adjustment as above -If leukocytosis persists despite normalization of Hgb and plt count, we may consider bone marrow biopsy to assess for any evidence of early myelofibrosis  Mild hypercalcemia -Likely due to increased diary intake and overall improvement in his nutrition -I counseled Justin [name] on Justin  importance of adequate hydration and to slightly reduce his diary intake  All questions were answered. Justin [name] knows to call Justin clinic with any problems, questions or concerns. No barriers to learning was detected.  A total of more than 25 minutes were spent face-to-face with Justin [name] during this encounter and over half of that time was spent on counseling and coordination of care as outlined above.   Return in 2 weeks for labs only. Return in 4 weeks for labs and clinic follow-up.   Justin [name] ZhTish Men/27/2020 1:35 PM  CHIEF COMPLAINT: "I just have some fatigue"  INTERVAL HISTORY: Justin [name] to clinic for follow-up of high risk PV.  Justin [name] reports that since increasing Justin Hydrea dose, he has tolerated Justin relatively well except mild persistent fatigue.  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 been trying to improve his nutrition, including drinking a lot of milk, which may explain his hypercalcemia.  He otherwise reports feeling well, and denies any symptoms related to Hydrea.  REVIEW OF SYSTEMS:   Constitutional: ( - ) fevers, ( - )  chills , ( - ) night sweats Eyes: ( - ) blurriness of vision, ( - ) double vision, ( - ) watery eyes Ears, nose, mouth, throat, and face: ( - ) mucositis, ( - ) sore throat Respiratory: ( - ) cough, ( - ) dyspnea, ( - ) wheezes Cardiovascular: ( - ) palpitation, ( - ) chest discomfort, ( - ) lower extremity swelling Gastrointestinal:  ( - ) nausea, ( - ) heartburn, ( - )  change in bowel habits Skin: ( - ) abnormal skin rashes Lymphatics: ( - ) new lymphadenopathy, ( - ) easy bruising Neurological: ( - ) numbness, ( - ) tingling, ( - ) new weaknesses Behavioral/Psych: ( - ) mood change, ( - ) new changes  All other systems were reviewed with Justin [name] and are negative.  I have reviewed Justin past medical history, past surgical history, social history and family history with Justin [name] and they  are unchanged from previous note.  ALLERGIES:  is allergic to codeine.  MEDICATIONS:  Current Outpatient Medications  Medication Sig Dispense Refill  . apixaban (ELIQUIS) 5 MG TABS tablet Take 1 tablet (5 mg total) by mouth 2 (two) times daily.    Marland Kitchen aspirin 81 MG tablet Take 81 mg by mouth daily.     . Cholecalciferol 125 MCG (5000 UT) capsule Take 1 capsule by mouth daily.     . cyclobenzaprine (FLEXERIL) 10 MG tablet Take 10 mg by mouth 3 (three) times daily.     . fluticasone (FLONASE) 50 MCG/ACT nasal spray Place 2 sprays into both nostrils daily.    . hydroxyurea (HYDREA) 500 MG capsule Take 2 capsules in Justin morning and one capsule at night. May take with food to minimize GI side effects. 90 capsule 1  . loratadine (CLARITIN) 10 MG tablet Take 10 mg by mouth daily.    . metFORMIN (GLUCOPHAGE-XR) 500 MG 24 hr tablet Take 500 mg by mouth daily before supper.    . metoprolol succinate (TOPROL XL) 25 MG 24 hr tablet Take 1 tablet (25 mg total) by mouth daily. 90 tablet 3  . nefazodone (SERZONE) 100 MG tablet Take 100 mg by mouth 2 (two) times daily.    . pregabalin (LYRICA) 50 MG capsule Take 50 mg by mouth 3 (three) times daily.    . rosuvastatin (CRESTOR) 20 MG tablet Take 1 tablet (20 mg total) by mouth daily. 90 tablet 3  . zolpidem (AMBIEN) 10 MG tablet Take 10 mg by mouth at bedtime as needed.     No current facility-administered medications for this visit.     PHYSICAL EXAMINATION: ECOG PERFORMANCE STATUS: 1 - Symptomatic but completely ambulatory  Today's Vitals   03/17/18 1021 03/17/18 1037  BP: (!) 156/69   Pulse: 99   Resp: 20   Temp: 98.3 F (36.8 C)   TempSrc: Oral   SpO2: 99%   Weight: 193 lb (87.5 kg)   PainSc:  0-No pain   Body mass index is 30 kg/m.  Filed Weights   03/17/18 1021  Weight: 193 lb (87.5 kg)    GENERAL: alert, no distress and comfortable SKIN: skin color, texture, turgor are normal, no rashes or significant lesions EYES: conjunctiva are  pink and non-injected, sclera clear OROPHARYNX: no exudate, no erythema; lips, buccal mucosa, and tongue normal  NECK: supple, non-tender LUNGS: clear to auscultation with normal breathing effort HEART: regular rate & rhythm and no murmurs and no lower extremity edema ABDOMEN: soft, non-tender, non-distended, normal bowel sounds Musculoskeletal: no cyanosis of digits and no clubbing  PSYCH: alert & oriented x 3, fluent speech NEURO: no focal motor/sensory deficits  LABORATORY DATA:  I have reviewed Justin data as listed    Component Value Date/Time   NA 139 03/17/2018 1008   K 4.2 03/17/2018 1008   CL 99 03/17/2018 1008   CO2 30 03/17/2018 1008   GLUCOSE 122 (H) 03/17/2018 1008   BUN 12 03/17/2018 1008   CREATININE  0.84 03/17/2018 1008   CALCIUM 10.4 (H) 03/17/2018 1008   PROT 8.6 (H) 03/17/2018 1008   ALBUMIN 4.2 03/17/2018 1008   AST 14 (L) 03/17/2018 1008   ALT 8 03/17/2018 1008   ALKPHOS 94 03/17/2018 1008   BILITOT 0.6 03/17/2018 1008   GFRNONAA >60 03/17/2018 1008   GFRAA >60 03/17/2018 1008    No results found for: SPEP, UPEP  Lab Results  Component Value Date   WBC 19.4 (H) 03/17/2018   NEUTROABS 15.2 (H) 03/17/2018   HGB 11.9 (L) 03/17/2018   HCT 37.4 (L) 03/17/2018   MCV 98.7 03/17/2018   PLT 153 03/17/2018      Chemistry      Component Value Date/Time   NA 139 03/17/2018 1008   K 4.2 03/17/2018 1008   CL 99 03/17/2018 1008   CO2 30 03/17/2018 1008   BUN 12 03/17/2018 1008   CREATININE 0.84 03/17/2018 1008      Component Value Date/Time   CALCIUM 10.4 (H) 03/17/2018 1008   ALKPHOS 94 03/17/2018 1008   AST 14 (L) 03/17/2018 1008   ALT 8 03/17/2018 1008   BILITOT 0.6 03/17/2018 1008       RADIOGRAPHIC STUDIES: I have personally reviewed Justin radiological images as listed below and agreed with Justin findings in Justin report. No results found.

## 2018-03-17 NOTE — Telephone Encounter (Signed)
Appointments scheduled letter/calendar mailed per 2/27 los

## 2018-03-18 ENCOUNTER — Encounter (HOSPITAL_COMMUNITY)
Admission: RE | Admit: 2018-03-18 | Discharge: 2018-03-18 | Disposition: A | Discharge status: Home / Self Care | Payer: BLUE CROSS/BLUE SHIELD | Source: Ambulatory Visit | Attending: Cardiology | Admitting: Cardiology

## 2018-03-18 ENCOUNTER — Ambulatory Visit (HOSPITAL_COMMUNITY): Payer: Self-pay

## 2018-03-18 DIAGNOSIS — Z951 Presence of aortocoronary bypass graft: Secondary | ICD-10-CM

## 2018-03-18 DIAGNOSIS — Z952 Presence of prosthetic heart valve: Secondary | ICD-10-CM

## 2018-03-18 NOTE — Progress Notes (Signed)
Reviewed home exercise guidelines with patient including endpoints, temperature precautions, target heart rate and rate of perceived exertion. Pt plans to walk as his mode of home exercise. Pt voices understanding of instructions given. Sharon Mt, Academic Intern Sol Passer, MS, ACSM CEP

## 2018-03-18 NOTE — Progress Notes (Signed)
Cardiac Individual Treatment Plan  Patient Details  Name: Justin [name] MRN: [id number] Date of Birth: December 04, 1955 Referring Provider:     CARDIAC REHAB PHASE II ORIENTATION from 03/03/2018 in [place]  Referring Provider  Dr. Harrell Gave      Initial Encounter Date:    CARDIAC REHAB PHASE II ORIENTATION from 03/03/2018 in [place]  Date  03/03/18      Visit Diagnosis: S/P AVR (aortic valve replacement)  S/P CABG x 4  Patient's Home Medications on Admission:  Current Outpatient Medications:  .  apixaban (ELIQUIS) 5 MG TABS tablet, Take 1 tablet (5 mg total) by mouth 2 (two) times daily., Disp: , Rfl:  .  aspirin 81 MG tablet, Take 81 mg by mouth daily. , Disp: , Rfl:  .  Cholecalciferol 125 MCG (5000 UT) capsule, Take 1 capsule by mouth daily. , Disp: , Rfl:  .  cyclobenzaprine (FLEXERIL) 10 MG tablet, Take 10 mg by mouth 3 (three) times daily. , Disp: , Rfl:  .  fluticasone (FLONASE) 50 MCG/ACT nasal spray, Place 2 sprays into both nostrils daily., Disp: , Rfl:  .  hydroxyurea (HYDREA) 500 MG capsule, Take 2 capsules in the morning and one capsule at night. May take with food to minimize GI side effects., Disp: 90 capsule, Rfl: 1 .  loratadine (CLARITIN) 10 MG tablet, Take 10 mg by mouth daily., Disp: , Rfl:  .  metFORMIN (GLUCOPHAGE-XR) 500 MG 24 hr tablet, Take 500 mg by mouth daily before supper., Disp: , Rfl:  .  metoprolol succinate (TOPROL XL) 25 MG 24 hr tablet, Take 1 tablet (25 mg total) by mouth daily., Disp: 90 tablet, Rfl: 3 .  nefazodone (SERZONE) 100 MG tablet, Take 100 mg by mouth 2 (two) times daily., Disp: , Rfl:  .  pregabalin (LYRICA) 50 MG capsule, Take 50 mg by mouth 3 (three) times daily., Disp: , Rfl:  .  rosuvastatin (CRESTOR) 20 MG tablet, Take 1 tablet (20 mg total) by mouth daily., Disp: 90 tablet, Rfl: 3 .  zolpidem (AMBIEN) 10 MG tablet, Take 10 mg by mouth at bedtime as needed.,  Disp: , Rfl:   Past Medical History: Past Medical History:  Diagnosis Date  . Acute meniscal tear of knee LEFT  . Aortic stenosis 12/01/2017   NONRHEUMATIC, AORTIC VALVE CALCIFICATIONS, MILD TO MODERATE REGURG, MILD TO MODERATE CALCIFIED ANNULUS per ECHO 10/25/17 @ [facility]  . Arthritis   . Atrial fibrillation ([place]) 11/12/2017   AT O/V WITH PCP  . DM (diabetes mellitus) ([place])   . Heart murmur MILD-- ASYMPTOMATIC  . Hyperlipidemia   . Hypertension   . Left knee pain   . PAD (peripheral artery disease) ([facility])    left leg claudication    Tobacco Use: Social History   Tobacco Use  Smoking Status Former Smoker  . Packs/day: 0.25  . Years: 23.00  . Pack years: 5.75  . Types: Cigars  . Last attempt to quit: 01/02/2018  . Years since quitting: 0.2  Smokeless Tobacco Never Used  Tobacco Comment   quit cigs in 2011 and smokes cigars daily- from 3-10 cigars-09/27/13    Labs: Recent Review Flowsheet Data    Labs for ITP Cardiac and Pulmonary Rehab Latest Ref Rng & Units 01/05/2018 01/05/2018 01/06/2018 01/07/2018 02/17/2018   Cholestrol 0 - 200 mg/dL - - - - 127   LDLCALC 0 - 99 mg/dL - - - - 70  HDL >40 mg/dL - - - - 41   Trlycerides <150 mg/dL - - - - 80   Hemoglobin A1c 4.8 - 5.6 % - - - - -   PHART 7.350 - 7.450 - - - - -   PCO2ART 32.0 - 48.0 mmHg - - - - -   HCO3 20.0 - 28.0 mmol/L - - - - -   TCO2 22 - 32 mmol/L - - 30 - -   ACIDBASEDEF 0.0 - 2.0 mmol/L - - - - -   O2SAT % 63.5 67.1 84.4 93.8 -      Capillary Blood Glucose: Lab Results  Component Value Date   GLUCAP 121 (H) 03/16/2018   GLUCAP 128 (H) 03/14/2018   GLUCAP 112 (H) 03/14/2018   GLUCAP 102 (H) 03/11/2018   GLUCAP 149 (H) 03/11/2018     Exercise Target Goals: Exercise Program Goal: Individual exercise prescription set using results from initial 6 min walk test and THRR while considering  patient's activity barriers and safety.   Exercise Prescription Goal: Initial exercise  prescription builds to 30-45 minutes a day of aerobic activity, 2-3 days per week.  Home exercise guidelines will be given to patient during program as part of exercise prescription that the participant will acknowledge.  Activity Barriers & Risk Stratification: Activity Barriers & Cardiac Risk Stratification - 03/03/18 1056      Activity Barriers & Cardiac Risk Stratification   Activity Barriers  Back Problems;Other (comment);Muscular Weakness;Deconditioning;History of Falls    Comments  Rotator cuff (Old injury)     Cardiac Risk Stratification  High       6 Minute Walk: 6 Minute Walk    Row Name 03/03/18 1055         6 Minute Walk   Phase  Initial     Distance  1325 feet     Walk Time  6 minutes     # of Rest Breaks  1     MPH  2.5     METS  3.2     RPE  13     VO2 Peak  11.2     Symptoms  Yes (comment)     Comments  Claudication 7/10 left leg     Resting HR  77 bpm     Resting BP  108/72     Resting Oxygen Saturation   96 %     Exercise Oxygen Saturation  during 6 min walk  96 %     Max Ex. HR  108 bpm     Max Ex. BP  130/82     2 Minute Post BP  112/64        Oxygen Initial Assessment:   Oxygen Re-Evaluation:   Oxygen Discharge (Final Oxygen Re-Evaluation):   Initial Exercise Prescription: Initial Exercise Prescription - 03/03/18 1100      Date of Initial Exercise RX and Referring Provider   Date  03/03/18    Referring Provider  Dr. Harrell Gave    Expected Discharge Date  06/10/18      Bike   Level  0.5    Minutes  10    METs  2.11      NuStep   Level  2    SPM  75    Minutes  10    METs  3      Track   Laps  9    Minutes  10    METs  2.53  Prescription Details   Frequency (times per week)  3    Duration  Progress to 30 minutes of continuous aerobic without signs/symptoms of physical distress      Intensity   THRR 40-80% of Max Heartrate  62-125    Ratings of Perceived Exertion  11-13      Progression   Progression  Continue to  progress workloads to maintain intensity without signs/symptoms of physical distress.      Resistance Training   Training Prescription  Yes    Weight  3 lbs.     Reps  10-15       Perform Capillary Blood Glucose checks as needed.  Exercise Prescription Changes:  Exercise Prescription Changes    Row Name 03/14/18 1050             Response to Exercise   Blood Pressure (Admit)  106/64       Blood Pressure (Exercise)  144/80       Blood Pressure (Exit)  130/68       Heart Rate (Admit)  99 bpm       Heart Rate (Exercise)  121 bpm       Heart Rate (Exit)  99 bpm       Rating of Perceived Exertion (Exercise)  13       Symptoms  none       Duration  Progress to 30 minutes of  aerobic without signs/symptoms of physical distress       Intensity  THRR unchanged         Progression   Progression  Continue to progress workloads to maintain intensity without signs/symptoms of physical distress.       Average METs  2         Resistance Training   Training Prescription  Yes       Weight  5lbs       Reps  10-15       Time  10 Minutes         Interval Training   Interval Training  No         Bike   Level  0.5       Minutes  5 Switched to recumbent bike due to poor posture       METs  -         Recumbant Bike   Level  1       Minutes  5         NuStep   Level  -       SPM  -       Minutes  -       METs  -         Arm Ergometer   Level  1       Minutes  10         Track   Laps  6       Minutes  10       METs  2.03          Exercise Comments:  Exercise Comments    Row Name 03/14/18 1050           Exercise Comments  METs reviewed with patient. Poor posture on the upright bike, switched to recumbent bike.          Exercise Goals and Review:  Exercise Goals    Row Name 03/03/18 1057  Exercise Goals   Increase Physical Activity  Yes       Intervention  Provide advice, education, support and counseling about physical activity/exercise  needs.;Develop an individualized exercise prescription for aerobic and resistive training based on initial evaluation findings, risk stratification, comorbidities and participant's personal goals.       Expected Outcomes  Short Term: Attend rehab on a regular basis to increase amount of physical activity.       Increase Strength and Stamina  Yes       Intervention  Provide advice, education, support and counseling about physical activity/exercise needs.;Develop an individualized exercise prescription for aerobic and resistive training based on initial evaluation findings, risk stratification, comorbidities and participant's personal goals.       Expected Outcomes  Short Term: Increase workloads from initial exercise prescription for resistance, speed, and METs.       Able to understand and use rate of perceived exertion (RPE) scale  Yes       Intervention  Provide education and explanation on how to use RPE scale       Expected Outcomes  Short Term: Able to use RPE daily in rehab to express subjective intensity level;Long Term:  Able to use RPE to guide intensity level when exercising independently       Knowledge and understanding of Target Heart Rate Range (THRR)  Yes       Intervention  Provide education and explanation of THRR including how the numbers were predicted and where they are located for reference       Expected Outcomes  Short Term: Able to state/look up THRR;Long Term: Able to use THRR to govern intensity when exercising independently;Short Term: Able to use daily as guideline for intensity in rehab       Able to check pulse independently  Yes       Intervention  Provide education and demonstration on how to check pulse in carotid and radial arteries.;Review the importance of being able to check your own pulse for safety during independent exercise       Expected Outcomes  Short Term: Able to explain why pulse checking is important during independent exercise;Long Term: Able to check  pulse independently and accurately       Understanding of Exercise Prescription  Yes       Intervention  Provide education, explanation, and written materials on patient's individual exercise prescription       Expected Outcomes  Short Term: Able to explain program exercise prescription;Long Term: Able to explain home exercise prescription to exercise independently       Improve claudication pain toleration; Improve walking ability  Yes       Intervention  Participate in PAD/SET Rehab 2-3 days a week, walking at home as part of exercise prescription;Attend education sessions to aid in risk factor modification and understanding of disease process       Expected Outcomes  Short Term: Improve walking distance/time to onset of claudication pain;Long Term: Improve score of PAD questionnaires;Long Term: Improve walking ability and toleration to claudication          Exercise Goals Re-Evaluation : Exercise Goals Re-Evaluation    Row Name 03/14/18 1050 03/18/18 1030           Exercise Goal Re-Evaluation   Exercise Goals Review  Increase Physical Activity;Able to understand and use rate of perceived exertion (RPE) scale  Increase Physical Activity;Able to understand and use rate of perceived exertion (RPE) scale  Comments  Patient able to understand and use RPE scale appropriately.  Patient understands exercise guidelines and what is expected of him at home. Patient will start walking a couple of days per week outside weather permitting.      Expected Outcomes  Progress workloads as tolerated to help increase strength and stamina and be able to walk further.  Patient will add walking outside at home at least 2 days per week.         Discharge Exercise Prescription (Final Exercise Prescription Changes): Exercise Prescription Changes - 03/14/18 1050      Response to Exercise   Blood Pressure (Admit)  106/64    Blood Pressure (Exercise)  144/80    Blood Pressure (Exit)  130/68    Heart Rate  (Admit)  99 bpm    Heart Rate (Exercise)  121 bpm    Heart Rate (Exit)  99 bpm    Rating of Perceived Exertion (Exercise)  13    Symptoms  none    Duration  Progress to 30 minutes of  aerobic without signs/symptoms of physical distress    Intensity  THRR unchanged      Progression   Progression  Continue to progress workloads to maintain intensity without signs/symptoms of physical distress.    Average METs  2      Resistance Training   Training Prescription  Yes    Weight  5lbs    Reps  10-15    Time  10 Minutes      Interval Training   Interval Training  No      Bike   Level  0.5    Minutes  5   Switched to recumbent bike due to poor posture   METs  --      Recumbant Bike   Level  1    Minutes  5      NuStep   Level  --    SPM  --    Minutes  --    METs  --      Arm Ergometer   Level  1    Minutes  10      Track   Laps  6    Minutes  10    METs  2.03       Nutrition:  Target Goals: Understanding of nutrition guidelines, daily intake of sodium 1500mg , cholesterol 200mg , calories 30% from fat and 7% or less from saturated fats, daily to have 5 or more servings of fruits and vegetables.  Biometrics: Pre Biometrics - 03/03/18 1057      Pre Biometrics   Height  5' 7.25" (1.708 m)    Weight  192 lb 14.4 oz (87.5 kg)    Waist Circumference  42.5 inches    Hip Circumference  43 inches    Waist to Hip Ratio  0.99 %    BMI (Calculated)  29.99    Triceps Skinfold  14 mm    % Body Fat  29.3 %    Grip Strength  29 kg    Flexibility  0 in    Single Leg Stand  10.75 seconds        Nutrition Therapy Plan and Nutrition Goals: Nutrition Therapy & Goals - 03/03/18 1108      Nutrition Therapy   Diet  heart healthy, carb modified      Personal Nutrition Goals   Nutrition Goal  Pt to identify and limit food sources of saturated fat, trans fat, refined  carbohydrates and sodium     Personal Goal #2  Pt to build a healthy plate including vegetables, fruits,  whole grains, and low-fat dairy products in a heart healthy meal plan    Personal Goal #3  Pt to eat a variety of non-starchy vegetables    Personal Goal #4  Pt able to name foods that affect blood glucose       Intervention Plan   Intervention  Prescribe, educate and counsel regarding individualized specific dietary modifications aiming towards targeted core components such as weight, hypertension, lipid management, diabetes, heart failure and other comorbidities.    Expected Outcomes  Short Term Goal: Understand basic principles of dietary content, such as calories, fat, sodium, cholesterol and nutrients.;Long Term Goal: Adherence to prescribed nutrition plan.       Nutrition Assessments: Nutrition Assessments - 03/03/18 1112      MEDFICTS Scores   Pre Score  36       Nutrition Goals Re-Evaluation: Nutrition Goals Re-Evaluation    Row Name 03/03/18 1108             Goals   Current Weight  192 lb 14.4 oz (87.5 kg)          Nutrition Goals Re-Evaluation: Nutrition Goals Re-Evaluation    [place] Name 03/03/18 1108             Goals   Current Weight  192 lb 14.4 oz (87.5 kg)          Nutrition Goals Discharge (Final Nutrition Goals Re-Evaluation): Nutrition Goals Re-Evaluation - 03/03/18 1108      Goals   Current Weight  192 lb 14.4 oz (87.5 kg)       Psychosocial: Target Goals: Acknowledge presence or absence of significant depression and/or stress, maximize coping skills, provide positive support system. Participant is able to verbalize types and ability to use techniques and skills needed for reducing stress and depression.  Initial Review & Psychosocial Screening: Initial Psych Review & Screening - 03/03/18 1220      Initial Review   Current issues with  None Identified      Family Dynamics   Good Support System?  Yes   Pt lists his wife and family as sources of support.      Barriers   Psychosocial barriers to participate in program  There are no  identifiable barriers or psychosocial needs.      Screening Interventions   Interventions  Encouraged to exercise       Quality of Life Scores: Quality of Life - 03/03/18 1058      Quality of Life   Select  Quality of Life      Quality of Life Scores   Health/Function Pre  25.43 %    Socioeconomic Pre  26.71 %    Psych/Spiritual Pre  27.21 %    Family Pre  24.75 %    GLOBAL Pre  25.09 %      Scores of 19 and below usually indicate a poorer quality of life in these areas.  A difference of  2-3 points is a clinically meaningful difference.  A difference of 2-3 points in the total score of the Quality of Life Index has been associated with significant improvement in overall quality of life, self-image, physical symptoms, and general health in studies assessing change in quality of life.  PHQ-9: Recent Review Flowsheet Data    Depression screen [facility] 2/9 03/11/2018 03/20/2014   Decreased Interest 0 0   Down, Depressed,  Hopeless 0 0   PHQ - 2 Score 0 0     Interpretation of Total Score  Total Score Depression Severity:  1-4 = Minimal depression, 5-9 = Mild depression, 10-14 = Moderate depression, 15-19 = Moderately severe depression, 20-27 = Severe depression   Psychosocial Evaluation and Intervention:   Psychosocial Re-Evaluation: Psychosocial Re-Evaluation    [place] Name 03/18/18 1719             Psychosocial Re-Evaluation   Current issues with  None Identified       Interventions  Encouraged to attend Cardiac Rehabilitation for the exercise       Continue Psychosocial Services   No Follow up required          Psychosocial Discharge (Final Psychosocial Re-Evaluation): Psychosocial Re-Evaluation - 03/18/18 1719      Psychosocial Re-Evaluation   Current issues with  None Identified    Interventions  Encouraged to attend Cardiac Rehabilitation for the exercise    Continue Psychosocial Services   No Follow up required       Vocational Rehabilitation: Provide  vocational rehab assistance to qualifying candidates.   Vocational Rehab Evaluation & Intervention: Vocational Rehab - 03/03/18 1221      Initial Vocational Rehab Evaluation & Intervention   Assessment shows need for Vocational Rehabilitation  Yes       Education: Education Goals: Education classes will be provided on a weekly basis, covering required topics. Participant will state understanding/return demonstration of topics presented.  Learning Barriers/Preferences: Learning Barriers/Preferences - 03/03/18 1100      Learning Barriers/Preferences   Learning Barriers  Sight    Learning Preferences  Written Material;Audio       Education Topics: Count Your Pulse:  -Group instruction provided by verbal instruction, demonstration, patient participation and written materials to support subject.  Instructors address importance of being able to find your pulse and how to count your pulse when at home without a heart monitor.  Patients get hands on experience counting their pulse with staff help and individually.   CARDIAC REHAB PHASE II EXERCISE from 03/18/2018 in [place]  Date  03/18/18  Instruction Review Code  2- Demonstrated Understanding      Heart Attack, Angina, and Risk Factor Modification:  -Group instruction provided by verbal instruction, video, and written materials to support subject.  Instructors address signs and symptoms of angina and heart attacks.    Also discuss risk factors for heart disease and how to make changes to improve heart health risk factors.   Functional Fitness:  -Group instruction provided by verbal instruction, demonstration, patient participation, and written materials to support subject.  Instructors address safety measures for doing things around the house.  Discuss how to get up and down off the floor, how to pick things up properly, how to safely get out of a chair without assistance, and balance  training.   Meditation and Mindfulness:  -Group instruction provided by verbal instruction, patient participation, and written materials to support subject.  Instructor addresses importance of mindfulness and meditation practice to help reduce stress and improve awareness.  Instructor also leads participants through a meditation exercise.    Stretching for Flexibility and Mobility:  -Group instruction provided by verbal instruction, patient participation, and written materials to support subject.  Instructors lead participants through series of stretches that are designed to increase flexibility thus improving mobility.  These stretches are additional exercise for major muscle groups that are typically performed during regular warm up  and cool down.   Hands Only CPR:  -Group verbal, video, and participation provides a basic overview of AHA guidelines for community CPR. Role-play of emergencies allow participants the opportunity to practice calling for help and chest compression technique with discussion of AED use.   Hypertension: -Group verbal and written instruction that provides a basic overview of hypertension including the most recent diagnostic guidelines, risk factor reduction with self-care instructions and medication management.    Nutrition I class: Heart Healthy Eating:  -Group instruction provided by PowerPoint slides, verbal discussion, and written materials to support subject matter. The instructor gives an explanation and review of the Therapeutic Lifestyle Changes diet recommendations, which includes a discussion on lipid goals, dietary fat, sodium, fiber, plant stanol/sterol esters, sugar, and the components of a well-balanced, healthy diet.   Nutrition II class: Lifestyle Skills:  -Group instruction provided by PowerPoint slides, verbal discussion, and written materials to support subject matter. The instructor gives an explanation and review of label reading, grocery  shopping for heart health, heart healthy recipe modifications, and ways to make healthier choices when eating out.   Diabetes Question & Answer:  -Group instruction provided by PowerPoint slides, verbal discussion, and written materials to support subject matter. The instructor gives an explanation and review of diabetes co-morbidities, pre- and post-prandial blood glucose goals, pre-exercise blood glucose goals, signs, symptoms, and treatment of hypoglycemia and hyperglycemia, and foot care basics.   Diabetes Blitz:  -Group instruction provided by PowerPoint slides, verbal discussion, and written materials to support subject matter. The instructor gives an explanation and review of the physiology behind type 1 and type 2 diabetes, diabetes medications and rational behind using different medications, pre- and post-prandial blood glucose recommendations and Hemoglobin A1c goals, diabetes diet, and exercise including blood glucose guidelines for exercising safely.    Portion Distortion:  -Group instruction provided by PowerPoint slides, verbal discussion, written materials, and food models to support subject matter. The instructor gives an explanation of serving size versus portion size, changes in portions sizes over the last 20 years, and what consists of a serving from each food group.   Stress Management:  -Group instruction provided by verbal instruction, video, and written materials to support subject matter.  Instructors review role of stress in heart disease and how to cope with stress positively.     Exercising on Your Own:  -Group instruction provided by verbal instruction, power point, and written materials to support subject.  Instructors discuss benefits of exercise, components of exercise, frequency and intensity of exercise, and end points for exercise.  Also discuss use of nitroglycerin and activating EMS.  Review options of places to exercise outside of rehab.  Review guidelines  for sex with heart disease.   Cardiac Drugs I:  -Group instruction provided by verbal instruction and written materials to support subject.  Instructor reviews cardiac drug classes: antiplatelets, anticoagulants, beta blockers, and statins.  Instructor discusses reasons, side effects, and lifestyle considerations for each drug class.   Cardiac Drugs II:  -Group instruction provided by verbal instruction and written materials to support subject.  Instructor reviews cardiac drug classes: angiotensin converting enzyme inhibitors (ACE-I), angiotensin II receptor blockers (ARBs), nitrates, and calcium channel blockers.  Instructor discusses reasons, side effects, and lifestyle considerations for each drug class.   Anatomy and Physiology of the Circulatory System:  Group verbal and written instruction and models provide basic cardiac anatomy and physiology, with the coronary electrical and arterial systems. Review of: AMI, Angina, Valve disease, Heart Failure,  Peripheral Artery Disease, Cardiac Arrhythmia, Pacemakers, and the ICD.   Other Education:  -Group or individual verbal, written, or video instructions that support the educational goals of the cardiac rehab program.   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   Knowledge Questionnaire Score: Knowledge Questionnaire Score - 03/03/18 1101      Knowledge Questionnaire Score   Pre Score  20/24       Core Components/Risk Factors/Patient Goals at Admission: Personal Goals and Risk Factors at Admission - 03/03/18 1106      Core Components/Risk Factors/Patient Goals on Admission    Weight Management  Yes;Weight Maintenance    Intervention  Weight Management:  Develop a combined nutrition and exercise program designed to reach desired caloric intake, while maintaining appropriate intake of nutrient and fiber, sodium and fats, and appropriate energy expenditure required for the weight goal.;Weight Management: Provide education and appropriate resources to help participant work on and attain dietary goals.;Weight Management/Obesity: Establish reasonable short term and long term weight goals.    Admit Weight  192 lb 14.4 oz (87.5 kg)    Expected Outcomes  Short Term: Continue to assess and modify interventions until short term weight is achieved;Long Term: Adherence to nutrition and physical activity/exercise program aimed toward attainment of established weight goal;Weight Maintenance: Understanding of the daily nutrition guidelines, which includes 25-35% calories from fat, 7% or less cal from saturated fats, less than 200mg  cholesterol, less than 1.5gm of sodium, & 5 or more servings of fruits and vegetables daily;Understanding recommendations for meals to include 15-35% energy as protein, 25-35% energy from fat, 35-60% energy from carbohydrates, less than 200mg  of dietary cholesterol, 20-35 gm of total fiber daily;Understanding of distribution of calorie intake throughout the day with the consumption of 4-5 meals/snacks    Tobacco Cessation  Yes    Intervention  Assist the participant in steps to quit. Provide individualized education and counseling about committing to Tobacco Cessation, relapse prevention, and pharmacological support that can be provided by physician.;Advice worker, assist with locating and accessing local/national Quit Smoking programs, and support quit date choice.    Expected Outcomes  Short Term: Will demonstrate readiness to quit, by selecting a quit date.    Diabetes  Yes    Intervention  Provide education about signs/symptoms and action to take for hypo/hyperglycemia.;Provide education about proper nutrition, including  hydration, and aerobic/resistive exercise prescription along with prescribed medications to achieve blood glucose in normal ranges: Fasting glucose 65-99 mg/dL    Expected Outcomes  Short Term: Participant verbalizes understanding of the signs/symptoms and immediate care of hyper/hypoglycemia, proper foot care and importance of medication, aerobic/resistive exercise and nutrition plan for blood glucose control.;Long Term: Attainment of HbA1C < 7%.    Hypertension  Yes    Intervention  Provide education on lifestyle modifcations including regular physical activity/exercise, weight management, moderate sodium restriction and increased consumption of fresh fruit, vegetables, and low fat dairy, alcohol moderation, and smoking cessation.;Monitor prescription use compliance.    Expected Outcomes  Short Term: Continued assessment and intervention until BP is < 140/13mm HG in hypertensive participants. < 130/62mm HG in hypertensive participants with diabetes, heart failure or chronic kidney disease.;Long Term: Maintenance of blood pressure at goal levels.    Lipids  Yes  Intervention  Provide education and support for participant on nutrition & aerobic/resistive exercise along with prescribed medications to achieve LDL 70mg , HDL >40mg .    Expected Outcomes  Short Term: Participant states understanding of desired cholesterol values and is compliant with medications prescribed. Participant is following exercise prescription and nutrition guidelines.;Long Term: Cholesterol controlled with medications as prescribed, with individualized exercise RX and with personalized nutrition plan. Value goals: LDL < 70mg , HDL > 40 mg.    Stress  Yes    Intervention  Offer individual and/or small group education and counseling on adjustment to heart disease, stress management and health-related lifestyle change. Teach and support self-help strategies.;Refer participants experiencing significant psychosocial distress to  appropriate mental health specialists for further evaluation and treatment. When possible, include family members and significant others in education/counseling sessions.    Expected Outcomes  Short Term: Participant demonstrates changes in health-related behavior, relaxation and other stress management skills, ability to obtain effective social support, and compliance with psychotropic medications if prescribed.;Long Term: Emotional wellbeing is indicated by absence of clinically significant psychosocial distress or social isolation.       Core Components/Risk Factors/Patient Goals Review:  Goals and Risk Factor Review    Row Name 03/18/18 1720             Core Components/Risk Factors/Patient Goals Review   Personal Goals Review  Weight Management/Obesity;Lipids;Hypertension;Diabetes;Tobacco Cessation       Review  Justin [name] is off to a slow but good start to exercise. Justin [name] is checking his own CBG's at home. Patient given a meter. Vital signs stable. Continue to encourage smoking cessation       Expected Outcomes  Justin [name] will continue to partcipate in cardiac rehab for nutrition, exercise and lifestyle modifications.          Core Components/Risk Factors/Patient Goals at Discharge (Final Review):  Goals and Risk Factor Review - 03/18/18 1720      Core Components/Risk Factors/Patient Goals Review   Personal Goals Review  Weight Management/Obesity;Lipids;Hypertension;Diabetes;Tobacco Cessation    Review  Justin [name] is off to a slow but good start to exercise. Justin [name] is checking his own CBG's at home. Patient given a meter. Vital signs stable. Continue to encourage smoking cessation    Expected Outcomes  Justin [name] will continue to partcipate in cardiac rehab for nutrition, exercise and lifestyle modifications.       ITP Comments: ITP Comments    Row Name 03/03/18 1024 03/18/18 1718         ITP Comments  Dr. Fransico Him, Medical Director  30 Day ITP Review. Justin [name] is off to a good start to exercise.          Comments: See ITP comments.Barnet Pall, RN,BSN 03/24/2018 11:47 AM

## 2018-03-21 ENCOUNTER — Ambulatory Visit (HOSPITAL_COMMUNITY): Payer: Self-pay

## 2018-03-21 ENCOUNTER — Encounter (HOSPITAL_COMMUNITY)
Admission: RE | Admit: 2018-03-21 | Discharge: 2018-03-21 | Disposition: A | Discharge status: Home / Self Care | Payer: BLUE CROSS/BLUE SHIELD | Source: Ambulatory Visit | Attending: Cardiology | Admitting: Cardiology

## 2018-03-21 DIAGNOSIS — Z951 Presence of aortocoronary bypass graft: Secondary | ICD-10-CM | POA: Insufficient documentation

## 2018-03-21 DIAGNOSIS — Z952 Presence of prosthetic heart valve: Secondary | ICD-10-CM

## 2018-03-23 ENCOUNTER — Encounter (HOSPITAL_COMMUNITY)
Admission: RE | Admit: 2018-03-23 | Discharge: 2018-03-23 | Disposition: A | Discharge status: Home / Self Care | Payer: BLUE CROSS/BLUE SHIELD | Source: Ambulatory Visit | Attending: Cardiology | Admitting: Cardiology

## 2018-03-23 ENCOUNTER — Ambulatory Visit (HOSPITAL_COMMUNITY): Payer: Self-pay

## 2018-03-23 DIAGNOSIS — Z951 Presence of aortocoronary bypass graft: Secondary | ICD-10-CM | POA: Diagnosis not present

## 2018-03-23 DIAGNOSIS — Z952 Presence of prosthetic heart valve: Secondary | ICD-10-CM | POA: Diagnosis not present

## 2018-03-25 ENCOUNTER — Ambulatory Visit (HOSPITAL_COMMUNITY): Payer: Self-pay

## 2018-03-25 ENCOUNTER — Encounter (HOSPITAL_COMMUNITY): Payer: BLUE CROSS/BLUE SHIELD

## 2018-03-28 ENCOUNTER — Encounter (HOSPITAL_COMMUNITY)
Admission: RE | Admit: 2018-03-28 | Discharge: 2018-03-28 | Disposition: A | Discharge status: Home / Self Care | Payer: BLUE CROSS/BLUE SHIELD | Source: Ambulatory Visit | Attending: Cardiology | Admitting: Cardiology

## 2018-03-28 ENCOUNTER — Ambulatory Visit (HOSPITAL_COMMUNITY): Payer: Self-pay

## 2018-03-28 DIAGNOSIS — Z951 Presence of aortocoronary bypass graft: Secondary | ICD-10-CM | POA: Diagnosis not present

## 2018-03-28 DIAGNOSIS — Z952 Presence of prosthetic heart valve: Secondary | ICD-10-CM | POA: Diagnosis not present

## 2018-03-30 ENCOUNTER — Encounter (HOSPITAL_COMMUNITY)
Admission: RE | Admit: 2018-03-30 | Discharge: 2018-03-30 | Disposition: A | Discharge status: Home / Self Care | Payer: BLUE CROSS/BLUE SHIELD | Source: Ambulatory Visit | Attending: Cardiology | Admitting: Cardiology

## 2018-03-30 ENCOUNTER — Ambulatory Visit (HOSPITAL_COMMUNITY): Payer: Self-pay

## 2018-03-30 ENCOUNTER — Other Ambulatory Visit: Payer: Self-pay

## 2018-03-30 DIAGNOSIS — Z951 Presence of aortocoronary bypass graft: Secondary | ICD-10-CM | POA: Diagnosis not present

## 2018-03-30 DIAGNOSIS — Z952 Presence of prosthetic heart valve: Secondary | ICD-10-CM | POA: Diagnosis not present

## 2018-03-31 ENCOUNTER — Inpatient Hospital Stay: Payer: BLUE CROSS/BLUE SHIELD | Attending: Hematology and Oncology

## 2018-03-31 DIAGNOSIS — Z7982 Long term (current) use of aspirin: Secondary | ICD-10-CM | POA: Diagnosis not present

## 2018-03-31 DIAGNOSIS — Z7984 Long term (current) use of oral hypoglycemic drugs: Secondary | ICD-10-CM | POA: Diagnosis not present

## 2018-03-31 DIAGNOSIS — D72829 Elevated white blood cell count, unspecified: Secondary | ICD-10-CM | POA: Diagnosis not present

## 2018-03-31 DIAGNOSIS — Z79899 Other long term (current) drug therapy: Secondary | ICD-10-CM | POA: Insufficient documentation

## 2018-03-31 DIAGNOSIS — R110 Nausea: Secondary | ICD-10-CM | POA: Insufficient documentation

## 2018-03-31 DIAGNOSIS — D75839 Thrombocytosis, unspecified: Secondary | ICD-10-CM

## 2018-03-31 DIAGNOSIS — Z7901 Long term (current) use of anticoagulants: Secondary | ICD-10-CM | POA: Diagnosis not present

## 2018-03-31 DIAGNOSIS — D473 Essential (hemorrhagic) thrombocythemia: Secondary | ICD-10-CM

## 2018-03-31 DIAGNOSIS — D45 Polycythemia vera: Secondary | ICD-10-CM | POA: Insufficient documentation

## 2018-03-31 DIAGNOSIS — R5383 Other fatigue: Secondary | ICD-10-CM | POA: Diagnosis not present

## 2018-03-31 DIAGNOSIS — I2510 Atherosclerotic heart disease of native coronary artery without angina pectoris: Secondary | ICD-10-CM | POA: Diagnosis not present

## 2018-03-31 LAB — CBC WITH DIFFERENTIAL (CANCER CENTER ONLY)
Abs Immature Granulocytes: 0.51 10*3/uL — ABNORMAL HIGH (ref 0.00–0.07)
Basophils Absolute: 0.2 10*3/uL — ABNORMAL HIGH (ref 0.0–0.1)
Basophils Relative: 1 %
Eosinophils Absolute: 0.1 10*3/uL (ref 0.0–0.5)
Eosinophils Relative: 1 %
HCT: 33.1 % — ABNORMAL LOW (ref 39.0–52.0)
Hemoglobin: 10.5 g/dL — ABNORMAL LOW (ref 13.0–17.0)
Immature Granulocytes: 3 %
Lymphocytes Relative: 9 %
Lymphs Abs: 1.3 10*3/uL (ref 0.7–4.0)
MCH: 31.8 pg (ref 26.0–34.0)
MCHC: 31.7 g/dL (ref 30.0–36.0)
MCV: 100.3 fL — ABNORMAL HIGH (ref 80.0–100.0)
Monocytes Absolute: 1.1 10*3/uL — ABNORMAL HIGH (ref 0.1–1.0)
Monocytes Relative: 7 %
Neutro Abs: 11.8 10*3/uL — ABNORMAL HIGH (ref 1.7–7.7)
Neutrophils Relative %: 79 %
Platelet Count: 380 10*3/uL (ref 150–400)
RBC: 3.3 MIL/uL — ABNORMAL LOW (ref 4.22–5.81)
RDW: 20 % — ABNORMAL HIGH (ref 11.5–15.5)
WBC Count: 15 10*3/uL — ABNORMAL HIGH (ref 4.0–10.5)
nRBC: 0 % (ref 0.0–0.2)

## 2018-03-31 LAB — LACTATE DEHYDROGENASE: LDH: 357 U/L — AB (ref 98–192)

## 2018-03-31 LAB — CMP (CANCER CENTER ONLY)
ALT: 10 U/L (ref 0–44)
AST: 15 U/L (ref 15–41)
Albumin: 3.7 g/dL (ref 3.5–5.0)
Alkaline Phosphatase: 99 U/L (ref 38–126)
Anion gap: 8 (ref 5–15)
BUN: 14 mg/dL (ref 8–23)
CHLORIDE: 101 mmol/L (ref 98–111)
CO2: 30 mmol/L (ref 22–32)
Calcium: 9.6 mg/dL (ref 8.9–10.3)
Creatinine: 0.76 mg/dL (ref 0.61–1.24)
GFR, Est AFR Am: >60 mL/min (ref >60)
GFR, Estimated: >60 mL/min (ref >60)
Glucose, Bld: 170 mg/dL — ABNORMAL HIGH (ref 70–99)
Potassium: 4.5 mmol/L (ref 3.5–5.1)
Sodium: 139 mmol/L (ref 135–145)
Total Bilirubin: 0.5 mg/dL (ref 0.3–1.2)
Total Protein: 7.5 g/dL (ref 6.5–8.1)

## 2018-04-01 ENCOUNTER — Encounter (HOSPITAL_COMMUNITY)
Admission: RE | Admit: 2018-04-01 | Discharge: 2018-04-01 | Disposition: A | Discharge status: Home / Self Care | Payer: BLUE CROSS/BLUE SHIELD | Source: Ambulatory Visit | Attending: Cardiology | Admitting: Cardiology

## 2018-04-01 ENCOUNTER — Other Ambulatory Visit: Payer: Self-pay

## 2018-04-01 ENCOUNTER — Ambulatory Visit (HOSPITAL_COMMUNITY): Payer: Self-pay

## 2018-04-01 DIAGNOSIS — Z951 Presence of aortocoronary bypass graft: Secondary | ICD-10-CM

## 2018-04-01 DIAGNOSIS — Z952 Presence of prosthetic heart valve: Secondary | ICD-10-CM | POA: Diagnosis not present

## 2018-04-04 ENCOUNTER — Telehealth (HOSPITAL_COMMUNITY): Payer: Self-pay | Admitting: Cardiac Rehabilitation

## 2018-04-04 ENCOUNTER — Ambulatory Visit (HOSPITAL_COMMUNITY): Payer: Self-pay

## 2018-04-04 ENCOUNTER — Encounter (HOSPITAL_COMMUNITY): Payer: BLUE CROSS/BLUE SHIELD

## 2018-04-04 NOTE — Telephone Encounter (Signed)
Phone call to patient to notify of [facility] Phase II departmental closing for 2 weeks.  Pt verbalized understanding.  Joann Rion, RN, BSN Cardiac Pulmonary Rehab  

## 2018-04-06 ENCOUNTER — Ambulatory Visit: Payer: Self-pay | Admitting: Cardiothoracic Surgery

## 2018-04-06 ENCOUNTER — Ambulatory Visit: Payer: BLUE CROSS/BLUE SHIELD | Admitting: Cardiothoracic Surgery

## 2018-04-06 ENCOUNTER — Encounter (HOSPITAL_COMMUNITY): Payer: BLUE CROSS/BLUE SHIELD

## 2018-04-06 ENCOUNTER — Ambulatory Visit (HOSPITAL_COMMUNITY): Payer: Self-pay

## 2018-04-07 ENCOUNTER — Encounter (HOSPITAL_COMMUNITY): Payer: Self-pay | Admitting: *Deleted

## 2018-04-07 DIAGNOSIS — Z952 Presence of prosthetic heart valve: Secondary | ICD-10-CM

## 2018-04-07 DIAGNOSIS — Z951 Presence of aortocoronary bypass graft: Secondary | ICD-10-CM

## 2018-04-08 ENCOUNTER — Encounter (HOSPITAL_COMMUNITY): Payer: BLUE CROSS/BLUE SHIELD

## 2018-04-08 ENCOUNTER — Ambulatory Visit (HOSPITAL_COMMUNITY): Payer: Self-pay

## 2018-04-11 ENCOUNTER — Ambulatory Visit (HOSPITAL_COMMUNITY): Payer: Self-pay

## 2018-04-11 ENCOUNTER — Encounter (HOSPITAL_COMMUNITY): Payer: BLUE CROSS/BLUE SHIELD

## 2018-04-12 ENCOUNTER — Encounter (HOSPITAL_COMMUNITY): Payer: Self-pay | Admitting: *Deleted

## 2018-04-12 ENCOUNTER — Telehealth (HOSPITAL_COMMUNITY): Payer: Self-pay | Admitting: *Deleted

## 2018-04-12 DIAGNOSIS — Z951 Presence of aortocoronary bypass graft: Secondary | ICD-10-CM

## 2018-04-12 DIAGNOSIS — Z952 Presence of prosthetic heart valve: Secondary | ICD-10-CM

## 2018-04-12 NOTE — Progress Notes (Signed)
Cardiac Individual Treatment Plan  Patient Details  Name: Justin [name] MRN: [id number] Date of Birth: 01-10-1956 Referring Provider:     CARDIAC REHAB PHASE II ORIENTATION from 03/03/2018 in [place]  Referring Provider  Dr. Harrell Gave      Initial Encounter Date:    CARDIAC REHAB PHASE II ORIENTATION from 03/03/2018 in [place]  Date  03/03/18      Visit Diagnosis: S/P AVR (aortic valve replacement)  S/P CABG x 4  Patient's Home Medications on Admission:  Current Outpatient Medications:  .  apixaban (ELIQUIS) 5 MG TABS tablet, Take 1 tablet (5 mg total) by mouth 2 (two) times daily., Disp: , Rfl:  .  aspirin 81 MG tablet, Take 81 mg by mouth daily. , Disp: , Rfl:  .  Cholecalciferol 125 MCG (5000 UT) capsule, Take 1 capsule by mouth daily. , Disp: , Rfl:  .  cyclobenzaprine (FLEXERIL) 10 MG tablet, Take 10 mg by mouth 3 (three) times daily. , Disp: , Rfl:  .  fluticasone (FLONASE) 50 MCG/ACT nasal spray, Place 2 sprays into both nostrils daily., Disp: , Rfl:  .  hydroxyurea (HYDREA) 500 MG capsule, Take 2 capsules in the morning and one capsule at night. May take with food to minimize GI side effects., Disp: 90 capsule, Rfl: 1 .  loratadine (CLARITIN) 10 MG tablet, Take 10 mg by mouth daily., Disp: , Rfl:  .  metFORMIN (GLUCOPHAGE-XR) 500 MG 24 hr tablet, Take 500 mg by mouth daily before supper., Disp: , Rfl:  .  metoprolol succinate (TOPROL XL) 25 MG 24 hr tablet, Take 1 tablet (25 mg total) by mouth daily., Disp: 90 tablet, Rfl: 3 .  nefazodone (SERZONE) 100 MG tablet, Take 100 mg by mouth 2 (two) times daily., Disp: , Rfl:  .  pregabalin (LYRICA) 50 MG capsule, Take 50 mg by mouth 3 (three) times daily., Disp: , Rfl:  .  rosuvastatin (CRESTOR) 20 MG tablet, Take 1 tablet (20 mg total) by mouth daily., Disp: 90 tablet, Rfl: 3 .  zolpidem (AMBIEN) 10 MG tablet, Take 10 mg by mouth at bedtime as needed.,  Disp: , Rfl:   Past Medical History: Past Medical History:  Diagnosis Date  . Acute meniscal tear of knee LEFT  . Aortic stenosis 12/01/2017   NONRHEUMATIC, AORTIC VALVE CALCIFICATIONS, MILD TO MODERATE REGURG, MILD TO MODERATE CALCIFIED ANNULUS per ECHO 10/25/17 @ [facility]  . Arthritis   . Atrial fibrillation ([place]) 11/12/2017   AT O/V WITH PCP  . DM (diabetes mellitus) ([place])   . Heart murmur MILD-- ASYMPTOMATIC  . Hyperlipidemia   . Hypertension   . Left knee pain   . PAD (peripheral artery disease) ([facility])    left leg claudication    Tobacco Use: Social History   Tobacco Use  Smoking Status Former Smoker  . Packs/day: 0.25  . Years: 23.00  . Pack years: 5.75  . Types: Cigars  . Last attempt to quit: 01/02/2018  . Years since quitting: 0.2  Smokeless Tobacco Never Used  Tobacco Comment   quit cigs in 2011 and smokes cigars daily- from 3-10 cigars-09/27/13    Labs: Recent Review Flowsheet Data    Labs for ITP Cardiac and Pulmonary Rehab Latest Ref Rng & Units 01/05/2018 01/05/2018 01/06/2018 01/07/2018 02/17/2018   Cholestrol 0 - 200 mg/dL - - - - 127   LDLCALC 0 - 99 mg/dL - - - - 70  HDL >40 mg/dL - - - - 41   Trlycerides <150 mg/dL - - - - 80   Hemoglobin A1c 4.8 - 5.6 % - - - - -   PHART 7.350 - 7.450 - - - - -   PCO2ART 32.0 - 48.0 mmHg - - - - -   HCO3 20.0 - 28.0 mmol/L - - - - -   TCO2 22 - 32 mmol/L - - 30 - -   ACIDBASEDEF 0.0 - 2.0 mmol/L - - - - -   O2SAT % 63.5 67.1 84.4 93.8 -      Capillary Blood Glucose: Lab Results  Component Value Date   GLUCAP 121 (H) 03/16/2018   GLUCAP 128 (H) 03/14/2018   GLUCAP 112 (H) 03/14/2018   GLUCAP 102 (H) 03/11/2018   GLUCAP 149 (H) 03/11/2018     Exercise Target Goals: Exercise Program Goal: Individual exercise prescription set using results from initial 6 min walk test and THRR while considering  patient's activity barriers and safety.   Exercise Prescription Goal: Initial exercise  prescription builds to 30-45 minutes a day of aerobic activity, 2-3 days per week.  Home exercise guidelines will be given to patient during program as part of exercise prescription that the participant will acknowledge.  Activity Barriers & Risk Stratification: Activity Barriers & Cardiac Risk Stratification - 03/03/18 1056      Activity Barriers & Cardiac Risk Stratification   Activity Barriers  Back Problems;Other (comment);Muscular Weakness;Deconditioning;History of Falls    Comments  Rotator cuff (Old injury)     Cardiac Risk Stratification  High       6 Minute Walk: 6 Minute Walk    Row Name 03/03/18 1055         6 Minute Walk   Phase  Initial     Distance  1325 feet     Walk Time  6 minutes     # of Rest Breaks  1     MPH  2.5     METS  3.2     RPE  13     VO2 Peak  11.2     Symptoms  Yes (comment)     Comments  Claudication 7/10 left leg     Resting HR  77 bpm     Resting BP  108/72     Resting Oxygen Saturation   96 %     Exercise Oxygen Saturation  during 6 min walk  96 %     Max Ex. HR  108 bpm     Max Ex. BP  130/82     2 Minute Post BP  112/64        Oxygen Initial Assessment:   Oxygen Re-Evaluation:   Oxygen Discharge (Final Oxygen Re-Evaluation):   Initial Exercise Prescription: Initial Exercise Prescription - 03/03/18 1100      Date of Initial Exercise RX and Referring Provider   Date  03/03/18    Referring Provider  Dr. Harrell Gave    Expected Discharge Date  06/10/18      Bike   Level  0.5    Minutes  10    METs  2.11      NuStep   Level  2    SPM  75    Minutes  10    METs  3      Track   Laps  9    Minutes  10    METs  2.53  Prescription Details   Frequency (times per week)  3    Duration  Progress to 30 minutes of continuous aerobic without signs/symptoms of physical distress      Intensity   THRR 40-80% of Max Heartrate  62-125    Ratings of Perceived Exertion  11-13      Progression   Progression  Continue to  progress workloads to maintain intensity without signs/symptoms of physical distress.      Resistance Training   Training Prescription  Yes    Weight  3 lbs.     Reps  10-15       Perform Capillary Blood Glucose checks as needed.  Exercise Prescription Changes:  Exercise Prescription Changes    Row Name 03/14/18 1050 03/28/18 0951 04/01/18 0951         Response to Exercise   Blood Pressure (Admit)  106/64  102/68  100/60     Blood Pressure (Exercise)  144/80  130/68  110/70     Blood Pressure (Exit)  130/68  102/70  98/64     Heart Rate (Admit)  99 bpm  92 bpm  98 bpm     Heart Rate (Exercise)  121 bpm  117 bpm  112 bpm     Heart Rate (Exit)  99 bpm  95 bpm  98 bpm     Rating of Perceived Exertion (Exercise)  13  13  15      Symptoms  none  none  none     Duration  Progress to 30 minutes of  aerobic without signs/symptoms of physical distress  Progress to 30 minutes of  aerobic without signs/symptoms of physical distress  Progress to 30 minutes of  aerobic without signs/symptoms of physical distress     Intensity  THRR unchanged  THRR unchanged  THRR unchanged       Progression   Progression  Continue to progress workloads to maintain intensity without signs/symptoms of physical distress.  Continue to progress workloads to maintain intensity without signs/symptoms of physical distress.  Continue to progress workloads to maintain intensity without signs/symptoms of physical distress.     Average METs  2  2.6  2.5       Resistance Training   Training Prescription  Yes  Yes  Yes     Weight  5lbs  5lbs  5lbs     Reps  10-15  10-15  10-15     Time  10 Minutes  10 Minutes  10 Minutes       Interval Training   Interval Training  No  No  No       Bike   Level  0.5  -  -     Minutes  5 Switched to recumbent bike due to poor posture  -  -     METs  -  -  -       Recumbant Bike   Level  1  1  -     Minutes  5  10  -     METs  -  2.5  -       NuStep   Level  -  -  3     SPM   -  -  -     Minutes  -  -  10     METs  -  -  2.7       Arm Ergometer   Level  1  1  1  Watts  -  -  13     Minutes  10  10  10      METs  -  -  1.75       Track   Laps  6  10  11      Minutes  10  10  10      METs  2.03  2.74  2.91       Home Exercise Plan   Plans to continue exercise at  -  Home (comment) Walking  Home (comment) Walking     Frequency  -  Add 2 additional days to program exercise sessions.  Add 2 additional days to program exercise sessions.     Initial Home Exercises Provided  -  03/18/18  03/18/18        Exercise Comments:  Exercise Comments    Row Name 03/14/18 1050 03/28/18 1005 03/28/18 1030       Exercise Comments  METs reviewed with patient. Poor posture on the upright bike, switched to recumbent bike.  Reviewed METs and goals with patient.  Home exercise guidelines reviewed with patient on 03/18/2018.        Exercise Goals and Review:  Exercise Goals    Row Name 03/03/18 1057             Exercise Goals   Increase Physical Activity  Yes       Intervention  Provide advice, education, support and counseling about physical activity/exercise needs.;Develop an individualized exercise prescription for aerobic and resistive training based on initial evaluation findings, risk stratification, comorbidities and participant's personal goals.       Expected Outcomes  Short Term: Attend rehab on a regular basis to increase amount of physical activity.       Increase Strength and Stamina  Yes       Intervention  Provide advice, education, support and counseling about physical activity/exercise needs.;Develop an individualized exercise prescription for aerobic and resistive training based on initial evaluation findings, risk stratification, comorbidities and participant's personal goals.       Expected Outcomes  Short Term: Increase workloads from initial exercise prescription for resistance, speed, and METs.       Able to understand and use rate of perceived  exertion (RPE) scale  Yes       Intervention  Provide education and explanation on how to use RPE scale       Expected Outcomes  Short Term: Able to use RPE daily in rehab to express subjective intensity level;Long Term:  Able to use RPE to guide intensity level when exercising independently       Knowledge and understanding of Target Heart Rate Range (THRR)  Yes       Intervention  Provide education and explanation of THRR including how the numbers were predicted and where they are located for reference       Expected Outcomes  Short Term: Able to state/look up THRR;Long Term: Able to use THRR to govern intensity when exercising independently;Short Term: Able to use daily as guideline for intensity in rehab       Able to check pulse independently  Yes       Intervention  Provide education and demonstration on how to check pulse in carotid and radial arteries.;Review the importance of being able to check your own pulse for safety during independent exercise       Expected Outcomes  Short Term: Able to explain why pulse checking is important during independent exercise;Long Term:  Able to check pulse independently and accurately       Understanding of Exercise Prescription  Yes       Intervention  Provide education, explanation, and written materials on patient's individual exercise prescription       Expected Outcomes  Short Term: Able to explain program exercise prescription;Long Term: Able to explain home exercise prescription to exercise independently       Improve claudication pain toleration; Improve walking ability  Yes       Intervention  Participate in PAD/SET Rehab 2-3 days a week, walking at home as part of exercise prescription;Attend education sessions to aid in risk factor modification and understanding of disease process       Expected Outcomes  Short Term: Improve walking distance/time to onset of claudication pain;Long Term: Improve score of PAD questionnaires;Long Term: Improve walking  ability and toleration to claudication          Exercise Goals Re-Evaluation : Exercise Goals Re-Evaluation    Row Name 03/14/18 1050 03/18/18 1030 03/28/18 1005 04/06/18 1119       Exercise Goal Re-Evaluation   Exercise Goals Review  Increase Physical Activity;Able to understand and use rate of perceived exertion (RPE) scale  Increase Physical Activity;Able to understand and use rate of perceived exertion (RPE) scale  Increase Physical Activity;Able to understand and use rate of perceived exertion (RPE) scale;Understanding of Exercise Prescription;Knowledge and understanding of Target Heart Rate Range (THRR);Improve claudication pain tolerance and improve walking ability  -    Comments  Patient able to understand and use RPE scale appropriately.  Patient understands exercise guidelines and what is expected of him at home. Patient will start walking a couple of days per week outside weather permitting.  Patient is walking 2-3 days/week at the supermarket as his mode of home exercise. Exercise is limited by claudication pain in left leg.  Temporary department closure due to COVID-19.    Expected Outcomes  Progress workloads as tolerated to help increase strength and stamina and be able to walk further.  Patient will add walking outside at home at least 2 days per week.  Continue to increase walking as tolerated to help improve walking ability.   -       Discharge Exercise Prescription (Final Exercise Prescription Changes): Exercise Prescription Changes - 04/01/18 0951      Response to Exercise   Blood Pressure (Admit)  100/60    Blood Pressure (Exercise)  110/70    Blood Pressure (Exit)  98/64    Heart Rate (Admit)  98 bpm    Heart Rate (Exercise)  112 bpm    Heart Rate (Exit)  98 bpm    Rating of Perceived Exertion (Exercise)  15    Symptoms  none    Duration  Progress to 30 minutes of  aerobic without signs/symptoms of physical distress    Intensity  THRR unchanged      Progression    Progression  Continue to progress workloads to maintain intensity without signs/symptoms of physical distress.    Average METs  2.5      Resistance Training   Training Prescription  Yes    Weight  5lbs    Reps  10-15    Time  10 Minutes      Interval Training   Interval Training  No      Recumbant Bike   Level  --    Minutes  --    METs  --      NuStep  Level  3    Minutes  10    METs  2.7      Arm Ergometer   Level  1    Watts  13    Minutes  10    METs  1.75      Track   Laps  11    Minutes  10    METs  2.91      Home Exercise Plan   Plans to continue exercise at  Home (comment)   Walking   Frequency  Add 2 additional days to program exercise sessions.    Initial Home Exercises Provided  03/18/18       Nutrition:  Target Goals: Understanding of nutrition guidelines, daily intake of sodium 1500mg , cholesterol 200mg , calories 30% from fat and 7% or less from saturated fats, daily to have 5 or more servings of fruits and vegetables.  Biometrics: Pre Biometrics - 03/03/18 1057      Pre Biometrics   Height  5' 7.25" (1.708 m)    Weight  192 lb 14.4 oz (87.5 kg)    Waist Circumference  42.5 inches    Hip Circumference  43 inches    Waist to Hip Ratio  0.99 %    BMI (Calculated)  29.99    Triceps Skinfold  14 mm    % Body Fat  29.3 %    Grip Strength  29 kg    Flexibility  0 in    Single Leg Stand  10.75 seconds        Nutrition Therapy Plan and Nutrition Goals: Nutrition Therapy & Goals - 03/03/18 1108      Nutrition Therapy   Diet  heart healthy, carb modified      Personal Nutrition Goals   Nutrition Goal  Pt to identify and limit food sources of saturated fat, trans fat, refined carbohydrates and sodium     Personal Goal #2  Pt to build a healthy plate including vegetables, fruits, whole grains, and low-fat dairy products in a heart healthy meal plan    Personal Goal #3  Pt to eat a variety of non-starchy vegetables    Personal Goal #4  Pt  able to name foods that affect blood glucose       Intervention Plan   Intervention  Prescribe, educate and counsel regarding individualized specific dietary modifications aiming towards targeted core components such as weight, hypertension, lipid management, diabetes, heart failure and other comorbidities.    Expected Outcomes  Short Term Goal: Understand basic principles of dietary content, such as calories, fat, sodium, cholesterol and nutrients.;Long Term Goal: Adherence to prescribed nutrition plan.       Nutrition Assessments: Nutrition Assessments - 03/03/18 1112      MEDFICTS Scores   Pre Score  36       Nutrition Goals Re-Evaluation: Nutrition Goals Re-Evaluation    [place] Name 03/03/18 1108             Goals   Current Weight  192 lb 14.4 oz (87.5 kg)          Nutrition Goals Re-Evaluation: Nutrition Goals Re-Evaluation    [place] Name 03/03/18 1108             Goals   Current Weight  192 lb 14.4 oz (87.5 kg)          Nutrition Goals Discharge (Final Nutrition Goals Re-Evaluation): Nutrition Goals Re-Evaluation - 03/03/18 1108      Goals   Current Weight  192 lb 14.4 oz (87.5 kg)       Psychosocial: Target Goals: Acknowledge presence or absence of significant depression and/or stress, maximize coping skills, provide positive support system. Participant is able to verbalize types and ability to use techniques and skills needed for reducing stress and depression.  Initial Review & Psychosocial Screening: Initial Psych Review & Screening - 03/03/18 1220      Initial Review   Current issues with  None Identified      Family Dynamics   Good Support System?  Yes   Pt lists his wife and family as sources of support.      Barriers   Psychosocial barriers to participate in program  There are no identifiable barriers or psychosocial needs.      Screening Interventions   Interventions  Encouraged to exercise       Quality of Life Scores: Quality of Life  - 03/03/18 1058      Quality of Life   Select  Quality of Life      Quality of Life Scores   Health/Function Pre  25.43 %    Socioeconomic Pre  26.71 %    Psych/Spiritual Pre  27.21 %    Family Pre  24.75 %    GLOBAL Pre  25.09 %      Scores of 19 and below usually indicate a poorer quality of life in these areas.  A difference of  2-3 points is a clinically meaningful difference.  A difference of 2-3 points in the total score of the Quality of Life Index has been associated with significant improvement in overall quality of life, self-image, physical symptoms, and general health in studies assessing change in quality of life.  PHQ-9: Recent Review Flowsheet Data    Depression screen [facility] 2/9 03/11/2018 03/20/2014   Decreased Interest 0 0   Down, Depressed, Hopeless 0 0   PHQ - 2 Score 0 0     Interpretation of Total Score  Total Score Depression Severity:  1-4 = Minimal depression, 5-9 = Mild depression, 10-14 = Moderate depression, 15-19 = Moderately severe depression, 20-27 = Severe depression   Psychosocial Evaluation and Intervention:   Psychosocial Re-Evaluation: Psychosocial Re-Evaluation    [place] Name 03/18/18 1719 04/07/18 1427           Psychosocial Re-Evaluation   Current issues with  None Identified  None Identified      Comments  -  [place] to assess as exercise is currently on hold      Interventions  Encouraged to attend Cardiac Rehabilitation for the exercise  -      Continue Psychosocial Services   No Follow up required  -         Psychosocial Discharge (Final Psychosocial Re-Evaluation): Psychosocial Re-Evaluation - 04/07/18 1427      Psychosocial Re-Evaluation   Current issues with  None Identified    Comments  [place] to assess as exercise is currently on hold       Vocational Rehabilitation: Provide vocational rehab assistance to qualifying candidates.   Vocational Rehab Evaluation & Intervention: Vocational Rehab - 03/03/18 1221      Initial  Vocational Rehab Evaluation & Intervention   Assessment shows need for Vocational Rehabilitation  Yes       Education: Education Goals: Education classes will be provided on a weekly basis, covering required topics. Participant will state understanding/return demonstration of topics presented.  Learning Barriers/Preferences: Learning Barriers/Preferences - 03/03/18 1100      Learning  Barriers/Preferences   Learning Barriers  Immunologist Material;Audio       Education Topics: Count Your Pulse:  -Group instruction provided by verbal instruction, demonstration, patient participation and written materials to support subject.  Instructors address importance of being able to find your pulse and how to count your pulse when at home without a heart monitor.  Patients get hands on experience counting their pulse with staff help and individually.   CARDIAC REHAB PHASE II EXERCISE from 04/01/2018 in [place]  Date  03/18/18  Instruction Review Code  2- Demonstrated Understanding      Heart Attack, Angina, and Risk Factor Modification:  -Group instruction provided by verbal instruction, video, and written materials to support subject.  Instructors address signs and symptoms of angina and heart attacks.    Also discuss risk factors for heart disease and how to make changes to improve heart health risk factors.   Functional Fitness:  -Group instruction provided by verbal instruction, demonstration, patient participation, and written materials to support subject.  Instructors address safety measures for doing things around the house.  Discuss how to get up and down off the floor, how to pick things up properly, how to safely get out of a chair without assistance, and balance training.   Meditation and Mindfulness:  -Group instruction provided by verbal instruction, patient participation, and written materials to support subject.   Instructor addresses importance of mindfulness and meditation practice to help reduce stress and improve awareness.  Instructor also leads participants through a meditation exercise.    Stretching for Flexibility and Mobility:  -Group instruction provided by verbal instruction, patient participation, and written materials to support subject.  Instructors lead participants through series of stretches that are designed to increase flexibility thus improving mobility.  These stretches are additional exercise for major muscle groups that are typically performed during regular warm up and cool down.   Hands Only CPR:  -Group verbal, video, and participation provides a basic overview of AHA guidelines for community CPR. Role-play of emergencies allow participants the opportunity to practice calling for help and chest compression technique with discussion of AED use.   Hypertension: -Group verbal and written instruction that provides a basic overview of hypertension including the most recent diagnostic guidelines, risk factor reduction with self-care instructions and medication management.   CARDIAC REHAB PHASE II EXERCISE from 04/01/2018 in [place]  Date  04/01/18  Instruction Review Code  2- Demonstrated Understanding       Nutrition I class: Heart Healthy Eating:  -Group instruction provided by PowerPoint slides, verbal discussion, and written materials to support subject matter. The instructor gives an explanation and review of the Therapeutic Lifestyle Changes diet recommendations, which includes a discussion on lipid goals, dietary fat, sodium, fiber, plant stanol/sterol esters, sugar, and the components of a well-balanced, healthy diet.   Nutrition II class: Lifestyle Skills:  -Group instruction provided by PowerPoint slides, verbal discussion, and written materials to support subject matter. The instructor gives an explanation and review of label reading,  grocery shopping for heart health, heart healthy recipe modifications, and ways to make healthier choices when eating out.   Diabetes Question & Answer:  -Group instruction provided by PowerPoint slides, verbal discussion, and written materials to support subject matter. The instructor gives an explanation and review of diabetes co-morbidities, pre- and post-prandial blood glucose goals, pre-exercise blood glucose goals, signs, symptoms, and treatment of hypoglycemia and  hyperglycemia, and foot care basics.   Diabetes Blitz:  -Group instruction provided by PowerPoint slides, verbal discussion, and written materials to support subject matter. The instructor gives an explanation and review of the physiology behind type 1 and type 2 diabetes, diabetes medications and rational behind using different medications, pre- and post-prandial blood glucose recommendations and Hemoglobin A1c goals, diabetes diet, and exercise including blood glucose guidelines for exercising safely.    Portion Distortion:  -Group instruction provided by PowerPoint slides, verbal discussion, written materials, and food models to support subject matter. The instructor gives an explanation of serving size versus portion size, changes in portions sizes over the last 20 years, and what consists of a serving from each food group.   Stress Management:  -Group instruction provided by verbal instruction, video, and written materials to support subject matter.  Instructors review role of stress in heart disease and how to cope with stress positively.     Exercising on Your Own:  -Group instruction provided by verbal instruction, power point, and written materials to support subject.  Instructors discuss benefits of exercise, components of exercise, frequency and intensity of exercise, and end points for exercise.  Also discuss use of nitroglycerin and activating EMS.  Review options of places to exercise outside of rehab.  Review  guidelines for sex with heart disease.   Cardiac Drugs I:  -Group instruction provided by verbal instruction and written materials to support subject.  Instructor reviews cardiac drug classes: antiplatelets, anticoagulants, beta blockers, and statins.  Instructor discusses reasons, side effects, and lifestyle considerations for each drug class.   Cardiac Drugs II:  -Group instruction provided by verbal instruction and written materials to support subject.  Instructor reviews cardiac drug classes: angiotensin converting enzyme inhibitors (ACE-I), angiotensin II receptor blockers (ARBs), nitrates, and calcium channel blockers.  Instructor discusses reasons, side effects, and lifestyle considerations for each drug class.   Anatomy and Physiology of the Circulatory System:  Group verbal and written instruction and models provide basic cardiac anatomy and physiology, with the coronary electrical and arterial systems. Review of: AMI, Angina, Valve disease, Heart Failure, Peripheral Artery Disease, Cardiac Arrhythmia, Pacemakers, and the ICD.   Other Education:  -Group or individual verbal, written, or video instructions that support the educational goals of the cardiac rehab program.   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   Knowledge Questionnaire Score: Knowledge Questionnaire Score - 03/03/18 1101      Knowledge Questionnaire Score   Pre Score  20/24       Core Components/Risk Factors/Patient Goals at Admission: Personal Goals and Risk Factors at Admission - 03/03/18 1106      Core Components/Risk Factors/Patient Goals on Admission    Weight Management  Yes;Weight Maintenance    Intervention  Weight  Management: Develop a combined nutrition and exercise program designed to reach desired caloric intake, while maintaining appropriate intake of nutrient and fiber, sodium and fats, and appropriate energy expenditure required for the weight goal.;Weight Management: Provide education and appropriate resources to help participant work on and attain dietary goals.;Weight Management/Obesity: Establish reasonable short term and long term weight goals.    Admit Weight  192 lb 14.4 oz (87.5 kg)    Expected Outcomes  Short Term: Continue to assess and modify  interventions until short term weight is achieved;Long Term: Adherence to nutrition and physical activity/exercise program aimed toward attainment of established weight goal;Weight Maintenance: Understanding of the daily nutrition guidelines, which includes 25-35% calories from fat, 7% or less cal from saturated fats, less than 200mg  cholesterol, less than 1.5gm of sodium, & 5 or more servings of fruits and vegetables daily;Understanding recommendations for meals to include 15-35% energy as protein, 25-35% energy from fat, 35-60% energy from carbohydrates, less than 200mg  of dietary cholesterol, 20-35 gm of total fiber daily;Understanding of distribution of calorie intake throughout the day with the consumption of 4-5 meals/snacks    Tobacco Cessation  Yes    Intervention  Assist the participant in steps to quit. Provide individualized education and counseling about committing to Tobacco Cessation, relapse prevention, and pharmacological support that can be provided by physician.;Advice worker, assist with locating and accessing local/national Quit Smoking programs, and support quit date choice.    Expected Outcomes  Short Term: Will demonstrate readiness to quit, by selecting a quit date.    Diabetes  Yes    Intervention  Provide education about signs/symptoms and action to take for hypo/hyperglycemia.;Provide education about proper nutrition,  including hydration, and aerobic/resistive exercise prescription along with prescribed medications to achieve blood glucose in normal ranges: Fasting glucose 65-99 mg/dL    Expected Outcomes  Short Term: Participant verbalizes understanding of the signs/symptoms and immediate care of hyper/hypoglycemia, proper foot care and importance of medication, aerobic/resistive exercise and nutrition plan for blood glucose control.;Long Term: Attainment of HbA1C < 7%.    Hypertension  Yes    Intervention  Provide education on lifestyle modifcations including regular physical activity/exercise, weight management, moderate sodium restriction and increased consumption of fresh fruit, vegetables, and low fat dairy, alcohol moderation, and smoking cessation.;Monitor prescription use compliance.    Expected Outcomes  Short Term: Continued assessment and intervention until BP is < 140/74mm HG in hypertensive participants. < 130/29mm HG in hypertensive participants with diabetes, heart failure or chronic kidney disease.;Long Term: Maintenance of blood pressure at goal levels.    Lipids  Yes    Intervention  Provide education and support for participant on nutrition & aerobic/resistive exercise along with prescribed medications to achieve LDL 70mg , HDL >40mg .    Expected Outcomes  Short Term: Participant states understanding of desired cholesterol values and is compliant with medications prescribed. Participant is following exercise prescription and nutrition guidelines.;Long Term: Cholesterol controlled with medications as prescribed, with individualized exercise RX and with personalized nutrition plan. Value goals: LDL < 70mg , HDL > 40 mg.    Stress  Yes    Intervention  Offer individual and/or small group education and counseling on adjustment to heart disease, stress management and health-related lifestyle change. Teach and support self-help strategies.;Refer participants experiencing significant psychosocial distress to  appropriate mental health specialists for further evaluation and treatment. When possible, include family members and significant others in education/counseling sessions.    Expected Outcomes  Short Term: Participant demonstrates changes in health-related behavior, relaxation and other stress management skills, ability to obtain effective social support, and compliance with psychotropic medications if prescribed.;Long Term: Emotional wellbeing is indicated by absence of clinically significant psychosocial distress or social isolation.       Core Components/Risk Factors/Patient Goals Review:  Goals and Risk Factor Review    Row Name 03/18/18 1720 04/07/18 1428           Core Components/Risk Factors/Patient Goals Review   Personal Goals Review  Weight Management/Obesity;Lipids;Hypertension;Diabetes;Tobacco Cessation  Weight Management/Obesity;Lipids;Hypertension;Diabetes;Tobacco Cessation      Review  Eamon is off to a slow but good start to exercise. Cillian is checking his own CBG's at home. Patient given a meter. Vital signs stable. Continue to encourage smoking cessation   Exercise is currently on hold per recommended guidelines from the federal government to prevent the spread of COVID-19      Expected Outcomes  Waldron will continue to partcipate in cardiac rehab for nutrition, exercise and lifestyle modifications.  Jerrald will continue to partcipate in cardiac rehab for nutrition, exercise and lifestyle modifications once exercise at cardiac rehab is resumed         Core Components/Risk Factors/Patient Goals at Discharge (Final Review):  Goals and Risk Factor Review - 04/07/18 1428      Core Components/Risk Factors/Patient Goals Review   Personal Goals Review  Weight Management/Obesity;Lipids;Hypertension;Diabetes;Tobacco Cessation    Review   Exercise is currently on hold per recommended guidelines from the federal government to prevent the spread of COVID-19    Expected Outcomes  Tracen will continue  to partcipate in cardiac rehab for nutrition, exercise and lifestyle modifications once exercise at cardiac rehab is resumed       ITP Comments: ITP Comments    Row Name 03/03/18 1024 03/18/18 1718 04/07/18 1426       ITP Comments  Dr. Fransico Him, Medical Director  30 Day ITP Review. Pastor is off to a good start to exercise.  30 Day ITP Review.  Exercise is currently on hold per recommended guidelines from the federal government to prevent the spread of COVID-19        Comments: See ITP comments.Barnet Pall, RN,BSN 04/12/2018 10:35 AM

## 2018-04-13 ENCOUNTER — Ambulatory Visit (HOSPITAL_COMMUNITY): Payer: Self-pay

## 2018-04-13 ENCOUNTER — Encounter (HOSPITAL_COMMUNITY): Payer: BLUE CROSS/BLUE SHIELD

## 2018-04-14 ENCOUNTER — Other Ambulatory Visit: Payer: Self-pay

## 2018-04-14 ENCOUNTER — Encounter: Payer: Self-pay | Admitting: Hematology

## 2018-04-14 ENCOUNTER — Inpatient Hospital Stay: Payer: BLUE CROSS/BLUE SHIELD

## 2018-04-14 ENCOUNTER — Inpatient Hospital Stay (HOSPITAL_BASED_OUTPATIENT_CLINIC_OR_DEPARTMENT_OTHER): Payer: BLUE CROSS/BLUE SHIELD | Admitting: Hematology

## 2018-04-14 VITALS — BP 128/69 | HR 81 | Temp 98.1°F | Resp 18 | Ht 68.0 in | Wt 200.8 lb

## 2018-04-14 DIAGNOSIS — Z7982 Long term (current) use of aspirin: Secondary | ICD-10-CM

## 2018-04-14 DIAGNOSIS — I2510 Atherosclerotic heart disease of native coronary artery without angina pectoris: Secondary | ICD-10-CM

## 2018-04-14 DIAGNOSIS — Z7984 Long term (current) use of oral hypoglycemic drugs: Secondary | ICD-10-CM

## 2018-04-14 DIAGNOSIS — R5383 Other fatigue: Secondary | ICD-10-CM | POA: Diagnosis not present

## 2018-04-14 DIAGNOSIS — R110 Nausea: Secondary | ICD-10-CM

## 2018-04-14 DIAGNOSIS — D473 Essential (hemorrhagic) thrombocythemia: Secondary | ICD-10-CM

## 2018-04-14 DIAGNOSIS — D72829 Elevated white blood cell count, unspecified: Secondary | ICD-10-CM

## 2018-04-14 DIAGNOSIS — Z7901 Long term (current) use of anticoagulants: Secondary | ICD-10-CM | POA: Diagnosis not present

## 2018-04-14 DIAGNOSIS — D75839 Thrombocytosis, unspecified: Secondary | ICD-10-CM

## 2018-04-14 DIAGNOSIS — Z79899 Other long term (current) drug therapy: Secondary | ICD-10-CM

## 2018-04-14 DIAGNOSIS — D45 Polycythemia vera: Secondary | ICD-10-CM

## 2018-04-14 LAB — CMP (CANCER CENTER ONLY)
ALT: 8 U/L (ref 0–44)
AST: 15 U/L (ref 15–41)
Albumin: 3.7 g/dL (ref 3.5–5.0)
Alkaline Phosphatase: 94 U/L (ref 38–126)
Anion gap: 9 (ref 5–15)
BUN: 9 mg/dL (ref 8–23)
CO2: 28 mmol/L (ref 22–32)
Calcium: 9.4 mg/dL (ref 8.9–10.3)
Chloride: 100 mmol/L (ref 98–111)
Creatinine: 0.92 mg/dL (ref 0.61–1.24)
GFR, Est AFR Am: >60 mL/min (ref >60)
GFR, Estimated: >60 mL/min (ref >60)
Glucose, Bld: 119 mg/dL — ABNORMAL HIGH (ref 70–99)
Potassium: 4.3 mmol/L (ref 3.5–5.1)
Sodium: 137 mmol/L (ref 135–145)
TOTAL PROTEIN: 7.3 g/dL (ref 6.5–8.1)
Total Bilirubin: 0.4 mg/dL (ref 0.3–1.2)

## 2018-04-14 LAB — CBC WITH DIFFERENTIAL (CANCER CENTER ONLY)
Abs Immature Granulocytes: 0.8 10*3/uL — ABNORMAL HIGH (ref 0.00–0.07)
Basophils Absolute: 0.2 10*3/uL — ABNORMAL HIGH (ref 0.0–0.1)
Basophils Relative: 2 %
Eosinophils Absolute: 0.1 10*3/uL (ref 0.0–0.5)
Eosinophils Relative: 1 %
HCT: 35.6 % — ABNORMAL LOW (ref 39.0–52.0)
Hemoglobin: 11.2 g/dL — ABNORMAL LOW (ref 13.0–17.0)
Immature Granulocytes: 6 %
Lymphocytes Relative: 15 %
Lymphs Abs: 2.1 10*3/uL (ref 0.7–4.0)
MCH: 31.5 pg (ref 26.0–34.0)
MCHC: 31.5 g/dL (ref 30.0–36.0)
MCV: 100.3 fL — ABNORMAL HIGH (ref 80.0–100.0)
Monocytes Absolute: 0.9 10*3/uL (ref 0.1–1.0)
Monocytes Relative: 7 %
NRBC: 0.4 % — AB (ref 0.0–0.2)
Neutro Abs: 9.9 10*3/uL — ABNORMAL HIGH (ref 1.7–7.7)
Neutrophils Relative %: 69 %
Platelet Count: 504 10*3/uL — ABNORMAL HIGH (ref 150–400)
RBC: 3.55 MIL/uL — ABNORMAL LOW (ref 4.22–5.81)
RDW: 20.3 % — ABNORMAL HIGH (ref 11.5–15.5)
WBC Count: 14 10*3/uL — ABNORMAL HIGH (ref 4.0–10.5)

## 2018-04-14 LAB — LACTATE DEHYDROGENASE: LDH: 417 U/L — ABNORMAL HIGH (ref 98–192)

## 2018-04-14 NOTE — Progress Notes (Signed)
[place] OFFICE PROGRESS NOTE  Patient Care Team: Justin Dials, MD as PCP - General (Family Medicine) Justin Dresser, MD as PCP - Cardiology (Cardiology)  HEME/ONC OVERVIEW: 1. 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 CAD s/p CABG x 4 in 12/2017 2. Moderate to severe aortic stenosis s/p aortic valve replacement and left atrial clip in 12/2017   ASSESSMENT & PLAN:   Polycythemia vera, JAK2+; high risk  -Previous patient of Justin [name]; no bone marrow bx at diagnosis  -Hct 35.6% today, no indication for phlebotomy; goal Hct < 45%  -See dosing adjustment of Hydrea below -On ASA 67m daily   Thrombocytosis  -Secondary to PV -Plt 504k today, rising since late 02/2018  -I personally reviewed the patient's peripheral blood smear, which showed increased platelet count with occasional immature platelets; there was no platelet clumping -Given the worsening thrombocytosis, I instructed the patient to increase his Hydrea dose to 5062mqAM and 100060mhs   Leukocytosis -Secondary to PV -WBC improving, 14k today  -Patient denies any symptoms of infection -See Hydrea dose adjustment as above -If leukocytosis persists despite normalization of hemoglobin and platelet count, we may consider bone marrow biopsy to assess for any evidence of early myelofibrosis  No orders of the defined types were placed in this encounter.  All questions were answered. The patient knows to call the clinic  with any problems, questions or concerns. No barriers to learning was detected.  A total of more than 25 minutes were spent face-to-face with the patient during this encounter and over half of that time was spent on counseling and coordination of care as outlined above.   Return in 1 month for labs and clinic follow-up.  [name] 04/14/2018 11:09 AM  CHIEF COMPLAINT: "I am just a little tired"  INTERVAL HISTORY: Justin [name] to clinic for follow-up of high risk PV.  Patient reports that he had been undergoing cardiopulmonary rehab after recent CABG, but his rehab had been canceled in light of COVID-19 outbreak.  Since last visit, he has been taking Hydrea 500 mg twice daily alternating with 500 mg qAM/1000m8ms.  He is tolerating the medication relatively well except fatigue and occasional nausea, for which he drinks some ginger ale with resolution without requiring any medications.  He denies any other symptoms, such as fever, night sweats, weight loss, or lymphadenopathy.  REVIEW OF SYSTEMS:   Constitutional: ( - ) fevers, ( - )  chills , ( - ) night sweats Eyes: ( - ) blurriness of vision, ( - ) double vision, ( - ) watery eyes Ears, nose, mouth, throat, and face: ( - ) mucositis, ( - ) sore throat Respiratory: ( - ) cough, ( - ) dyspnea, ( - ) wheezes Cardiovascular: ( - ) palpitation, ( - ) chest discomfort, ( - ) lower extremity swelling Gastrointestinal:  ( - ) nausea, ( - ) heartburn, ( - ) change in bowel habits Skin: ( + ) abnormal skin rashes Lymphatics: ( - ) new lymphadenopathy, ( - )  easy bruising Neurological: ( - ) numbness, ( - ) tingling, ( - ) new weaknesses Behavioral/Psych: ( - ) mood change, ( - ) new changes  All other systems were reviewed with the patient and are negative.  I have reviewed the past medical history, past surgical history, social history and family history with the patient and they are unchanged from previous note.  ALLERGIES:  is  allergic to codeine.  MEDICATIONS:  Current Outpatient Medications  Medication Sig Dispense Refill  . apixaban (ELIQUIS) 5 MG TABS tablet Take 1 tablet (5 mg total) by mouth 2 (two) times daily.    Marland Kitchen aspirin 81 MG tablet Take 81 mg by mouth daily.     . Cholecalciferol 125 MCG (5000 UT) capsule Take 1 capsule by mouth daily.     . cyclobenzaprine (FLEXERIL) 10 MG tablet Take 10 mg by mouth 3 (three) times daily.     . fluticasone (FLONASE) 50 MCG/ACT nasal spray Place 2 sprays into both nostrils daily.    . hydroxyurea (HYDREA) 500 MG capsule Take 2 capsules in the morning and one capsule at night. May take with food to minimize GI side effects. 90 capsule 1  . loratadine (CLARITIN) 10 MG tablet Take 10 mg by mouth daily.    . metFORMIN (GLUCOPHAGE-XR) 500 MG 24 hr tablet Take 500 mg by mouth daily before supper.    . metoprolol succinate (TOPROL XL) 25 MG 24 hr tablet Take 1 tablet (25 mg total) by mouth daily. 90 tablet 3  . nefazodone (SERZONE) 100 MG tablet Take 100 mg by mouth 2 (two) times daily.    . pregabalin (LYRICA) 50 MG capsule Take 50 mg by mouth 3 (three) times daily.    . rosuvastatin (CRESTOR) 20 MG tablet Take 1 tablet (20 mg total) by mouth daily. 90 tablet 3  . zolpidem (AMBIEN) 10 MG tablet Take 10 mg by mouth at bedtime as needed.     No current facility-administered medications for this visit.     PHYSICAL EXAMINATION: ECOG PERFORMANCE STATUS: 1 - Symptomatic but completely ambulatory  Today's Vitals   04/14/18 1027  BP: 128/69  Pulse: 81  Resp: 18  Temp: 98.1 F (36.7 C)  TempSrc: Oral  SpO2: 100%  Weight: 200 lb 12.8 oz (91.1 kg)  Height: _0  (1.727 m)  PainSc: 0-No pain   Body mass index is 30.53 kg/m.  Filed Weights   04/14/18 1027  Weight: 200 lb 12.8 oz (91.1 kg)    GENERAL: alert, no distress and comfortable SKIN: Chronic sun damaged skin over bilateral arms EYES: conjunctiva are pink and non-injected, sclera clear OROPHARYNX: no  exudate, no erythema; lips, buccal mucosa, and tongue normal  NECK: supple, non-tender LYMPH:  no palpable lymphadenopathy in the cervical LUNGS: clear to auscultation with normal breathing effort HEART: regular rate & rhythm and no murmurs and no lower extremity edema ABDOMEN: soft, non-tender, non-distended, normal bowel sounds Musculoskeletal: no cyanosis of digits and no clubbing  PSYCH: alert & oriented x 3, fluent speech NEURO: no focal motor/sensory deficits  LABORATORY DATA:  I have reviewed the data as listed    Component Value Date/Time   NA 137 04/14/2018 0958   K 4.3 04/14/2018 0958   CL 100 04/14/2018 0958   CO2 28 04/14/2018 0958   GLUCOSE 119 (H) 04/14/2018 0958   BUN 9 04/14/2018 0958   CREATININE 0.92 04/14/2018 0958   CALCIUM 9.4 04/14/2018 0958   PROT 7.3 04/14/2018 0958  ALBUMIN 3.7 04/14/2018 0958   AST 15 04/14/2018 0958   ALT 8 04/14/2018 0958   ALKPHOS 94 04/14/2018 0958   BILITOT 0.4 04/14/2018 0958   GFRNONAA >60 04/14/2018 0958   GFRAA >60 04/14/2018 0958    No results found for: SPEP, UPEP  Lab Results  Component Value Date   WBC 14.0 (H) 04/14/2018   NEUTROABS 9.9 (H) 04/14/2018   HGB 11.2 (L) 04/14/2018   HCT 35.6 (L) 04/14/2018   MCV 100.3 (H) 04/14/2018   PLT 504 (H) 04/14/2018      Chemistry      Component Value Date/Time   NA 137 04/14/2018 0958   K 4.3 04/14/2018 0958   CL 100 04/14/2018 0958   CO2 28 04/14/2018 0958   BUN 9 04/14/2018 0958   CREATININE 0.92 04/14/2018 0958      Component Value Date/Time   CALCIUM 9.4 04/14/2018 0958   ALKPHOS 94 04/14/2018 0958   AST 15 04/14/2018 0958   ALT 8 04/14/2018 0958   BILITOT 0.4 04/14/2018 0958      PATHOLOGY: I personally reviewed the patient's peripheral blood smear today.  The red blood cells were of normal morphology.  There was no schistocytosis.  The white blood cells were of normal morphology. There were no peripheral circulating blasts. The platelets were slightly  increased in number with occasional immature platelets. I verified that there were no platelet clumping.

## 2018-04-15 ENCOUNTER — Encounter (HOSPITAL_COMMUNITY): Payer: BLUE CROSS/BLUE SHIELD

## 2018-04-15 ENCOUNTER — Ambulatory Visit (HOSPITAL_COMMUNITY): Payer: Self-pay

## 2018-04-18 ENCOUNTER — Ambulatory Visit (HOSPITAL_COMMUNITY): Payer: Self-pay

## 2018-04-18 ENCOUNTER — Encounter (HOSPITAL_COMMUNITY): Payer: BLUE CROSS/BLUE SHIELD

## 2018-04-20 ENCOUNTER — Ambulatory Visit (HOSPITAL_COMMUNITY): Payer: Self-pay

## 2018-04-20 ENCOUNTER — Encounter (HOSPITAL_COMMUNITY): Payer: BLUE CROSS/BLUE SHIELD

## 2018-04-22 ENCOUNTER — Encounter (HOSPITAL_COMMUNITY): Payer: BLUE CROSS/BLUE SHIELD

## 2018-04-22 ENCOUNTER — Ambulatory Visit (HOSPITAL_COMMUNITY): Payer: Self-pay

## 2018-04-25 ENCOUNTER — Ambulatory Visit (HOSPITAL_COMMUNITY): Payer: Self-pay

## 2018-04-25 ENCOUNTER — Encounter (HOSPITAL_COMMUNITY): Payer: BLUE CROSS/BLUE SHIELD

## 2018-04-27 ENCOUNTER — Telehealth (HOSPITAL_COMMUNITY): Payer: Self-pay | Admitting: *Deleted

## 2018-04-27 ENCOUNTER — Ambulatory Visit: Payer: BLUE CROSS/BLUE SHIELD | Admitting: Cardiothoracic Surgery

## 2018-04-27 ENCOUNTER — Telehealth: Payer: Self-pay | Admitting: Cardiothoracic Surgery

## 2018-04-27 ENCOUNTER — Encounter (HOSPITAL_COMMUNITY): Payer: BLUE CROSS/BLUE SHIELD

## 2018-04-27 ENCOUNTER — Ambulatory Visit (HOSPITAL_COMMUNITY): Payer: Self-pay

## 2018-04-27 NOTE — Telephone Encounter (Signed)
  I am in the  [facility] office and completed a patient visit by telephone with Justin [name]] on April 27, 2018 at 12:20 PM. I verified that I was speaking with the correct person using two identifiers.Consent for the telephone visit was obtained from the patient who agreed to proceed.  The patient is recovering from urgent AVR CABG with combined carotid endarterectomy approximately 4 months ago.  He has significant underlying comorbid medical problems including polycythemia vera, thrombocytosis, COPD.  He has chronic atrial fibrillation with diastolic heart failure.  The patient was just able to start his scheduled phase 2 outpatient cardiac rehab when the program was shut down because of the COVID-19 pandemic the patient is walking some on his own but has not reached the prescribed goal of 30 minutes a day for 150 minutes a week.  The patient denies recurrent symptoms of angina but does have dyspnea on exertion due to deconditioning.  No edema.  He is compliant with his medications.  He recently increased his dose of urea as prescribed by his hematologist for climbing platelet count.  It is my impression the patient is not yet ready to return to work because his cardiac rehabilitation has been delayed.  I discussed with him the optimal heart healthy exercise requirement with a goal of 150 minutes/week or 30 minutes a day for 5 days of aerobic exercise-walking, treadmill, stationary bike, swimming.  He knows that 4 months after surgery his sternal precautions are now lifted and he can lift more than 10 pounds.  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

## 2018-04-27 NOTE — Telephone Encounter (Signed)
Called to notify patient that the cardiac and pulmonary rehabilitation department will be closed temporarily due to COVID-19 restrictions. Pt verbalized understanding.  Patient is doing some walking at home but is limited by claudication in his legs.  Sol Passer, MS, ACSM CEP 04/27/2018 1020

## 2018-04-29 ENCOUNTER — Encounter (HOSPITAL_COMMUNITY): Payer: BLUE CROSS/BLUE SHIELD

## 2018-04-29 ENCOUNTER — Ambulatory Visit (HOSPITAL_COMMUNITY): Payer: Self-pay

## 2018-05-02 ENCOUNTER — Ambulatory Visit (HOSPITAL_COMMUNITY): Payer: Self-pay

## 2018-05-02 ENCOUNTER — Encounter (HOSPITAL_COMMUNITY): Payer: BLUE CROSS/BLUE SHIELD

## 2018-05-04 ENCOUNTER — Encounter (HOSPITAL_COMMUNITY): Payer: BLUE CROSS/BLUE SHIELD

## 2018-05-04 ENCOUNTER — Ambulatory Visit (HOSPITAL_COMMUNITY): Payer: Self-pay

## 2018-05-05 ENCOUNTER — Telehealth: Payer: Self-pay

## 2018-05-05 NOTE — Telephone Encounter (Signed)
Virtual Visit Pre-Appointment Phone Call  Steps For Call:  1. Confirm consent - "In the setting of the current Covid19 crisis, you are scheduled for a (phone or video) visit with your provider on (date) at (time).  Just as we do with many in-office visits, in order for you to participate in this visit, we must obtain consent.  If you'd like, I can send this to your mychart (if signed up) or email for you to review.  Otherwise, I can obtain your verbal consent now.  All virtual visits are billed to your insurance company just like a normal visit would be.  By agreeing to a virtual visit, we'd like you to understand that the technology does not allow for your provider to perform an examination, and thus may limit your provider's ability to fully assess your condition.  Finally, though the technology is pretty good, we cannot assure that it will always work on either your or our end, and in the setting of a video visit, we may have to convert it to a phone-only visit.  In either situation, we cannot ensure that we have a secure connection.  Are you willing to proceed?" STAFF: Did the patient verbally acknowledge consent to telehealth visit? Document YES/NO here: YES  2. Confirm the BEST phone number to call the day of the visit by including in appointment notes  3. Give patient instructions for WebEx/MyChart download to smartphone as below or Doximity/Doxy.me if video visit (depending on what platform provider is using)  4. Advise patient to be prepared with their blood pressure, heart rate, weight, any heart rhythm information, their current medicines, and a piece of paper and pen handy for any instructions they may receive the day of their visit  5. Inform patient they will receive a phone call 15 minutes prior to their appointment time (may be from unknown caller ID) so they should be prepared to answer  6. Confirm that appointment type is correct in Epic appointment notes (VIDEO vs PHONE)      TELEPHONE CALL NOTE  Justin [name] has been deemed a candidate for a follow-up tele-health visit to limit community exposure during the Covid-19 pandemic. I spoke with the patient via phone to ensure availability of phone/video source, confirm preferred email & phone number, and discuss instructions and expectations.  I reminded Justin [name] to be prepared with any vital sign and/or heart rhythm information that could potentially be obtained via home monitoring, at the time of his visit. I reminded Justin [name] to expect a phone call at the time of his visit if his visit.  Justin Crutch, RN 05/05/2018 1:20 PM   INSTRUCTIONS FOR DOWNLOADING THE [place] APP TO SMARTPHONE  - If Apple, ask patient to go to App Store and type in WebEx in the search bar. [employer], the blue/green circle. If Android, go to [employer] and type in [employer] in the search bar. The app is free but as with any other app downloads, their phone may require them to verify saved payment information or Apple/Android password.  - The patient does NOT have to create an account. - On the day of the visit, the assist will walk the patient through joining the meeting with the meeting number/password.  INSTRUCTIONS FOR DOWNLOADING THE MYCHART APP TO SMARTPHONE  - The patient must first make sure to have activated MyChart and know their login information - If Apple, go to [employer] and type  in [place] in the search bar and download the app. If Android, ask patient to go to [employer] and type in [place] in the search bar and download the app. The app is free but as with any other app downloads, their phone may require them to verify saved payment information or Apple/Android password.  - The patient will need to then log into the app with their MyChart username and password, and select [facility] as their healthcare provider to link the account. When it is time for your visit, go to the  MyChart app, find appointments, and click Begin Video Visit. Be sure to Select Allow for your device to access the Microphone and Camera for your visit. You will then be connected, and your provider will be with you shortly.  **If they have any issues connecting, or need assistance please contact MyChart service desk [phone])**  **If using a computer, in order to ensure the best quality for their visit they will need to use either of the following Internet Browsers: [employer], or Google Chrome**  IF USING DOXIMITY or DOXY.ME - The patient will receive a link just prior to their visit, either by text or email (to be determined day of appointment depending on if it's doxy.me or Doximity).     FULL LENGTH CONSENT FOR TELE-HEALTH VISIT   I hereby voluntarily request, consent and authorize [facility] and its employed or contracted physicians, physician assistants, nurse practitioners or other licensed health care professionals (the Practitioner), to provide me with telemedicine health care services (the "Services") as deemed necessary by the treating Practitioner. I acknowledge and consent to receive the Services by the Practitioner via telemedicine. I understand that the telemedicine visit will involve communicating with the Practitioner through live audiovisual communication technology and the disclosure of certain medical information by electronic transmission. I acknowledge that I have been given the opportunity to request an in-person assessment or other available alternative prior to the telemedicine visit and am voluntarily participating in the telemedicine visit.  I understand that I have the right to withhold or withdraw my consent to the use of telemedicine in the course of my care at any time, without affecting my right to future care or treatment, and that the Practitioner or I may terminate the telemedicine visit at any time. I understand that I have the right to  inspect all information obtained and/or recorded in the course of the telemedicine visit and may receive copies of available information for a reasonable fee.  I understand that some of the potential risks of receiving the Services via telemedicine include:  Marland Kitchen Delay or interruption in medical evaluation due to technological equipment failure or disruption; . Information transmitted may not be sufficient (e.g. poor resolution of images) to allow for appropriate medical decision making by the Practitioner; and/or  . In rare instances, security protocols could fail, causing a breach of personal health information.  Furthermore, I acknowledge that it is my responsibility to provide information about my medical history, conditions and care that is complete and accurate to the best of my ability. I acknowledge that Practitioner's advice, recommendations, and/or decision may be based on factors not within their control, such as incomplete or inaccurate data provided by me or distortions of diagnostic images or specimens that may result from electronic transmissions. I understand that the practice of medicine is not an exact science and that Practitioner makes no warranties or guarantees regarding treatment outcomes. I acknowledge that I will receive a  copy of this consent concurrently upon execution via email to the email address I last provided but may also request a printed copy by calling the office of [facility].    I understand that my insurance will be billed for this visit.   I have read or had this consent read to me. . I understand the contents of this consent, which adequately explains the benefits and risks of the Services being provided via telemedicine.  . I have been provided ample opportunity to ask questions regarding this consent and the Services and have had my questions answered to my satisfaction. . I give my informed consent for the services to be provided through the use of  telemedicine in my medical care  By participating in this telemedicine visit I agree to the above.

## 2018-05-06 ENCOUNTER — Encounter (HOSPITAL_COMMUNITY): Payer: BLUE CROSS/BLUE SHIELD

## 2018-05-06 ENCOUNTER — Telehealth: Payer: Self-pay | Admitting: Cardiology

## 2018-05-06 ENCOUNTER — Ambulatory Visit (HOSPITAL_COMMUNITY): Payer: Self-pay

## 2018-05-06 NOTE — Telephone Encounter (Signed)
Home phone/ virtual consent/ my chart/ pre reg completed

## 2018-05-09 ENCOUNTER — Encounter (HOSPITAL_COMMUNITY): Payer: BLUE CROSS/BLUE SHIELD

## 2018-05-09 ENCOUNTER — Telehealth (INDEPENDENT_AMBULATORY_CARE_PROVIDER_SITE_OTHER): Payer: BLUE CROSS/BLUE SHIELD | Admitting: Cardiology

## 2018-05-09 ENCOUNTER — Ambulatory Visit (HOSPITAL_COMMUNITY): Payer: Self-pay

## 2018-05-09 ENCOUNTER — Encounter: Payer: Self-pay | Admitting: Cardiology

## 2018-05-09 VITALS — BP 134/67 | HR 62 | Ht 68.0 in | Wt 192.0 lb

## 2018-05-09 DIAGNOSIS — Z79899 Other long term (current) drug therapy: Secondary | ICD-10-CM

## 2018-05-09 DIAGNOSIS — I739 Peripheral vascular disease, unspecified: Secondary | ICD-10-CM

## 2018-05-09 DIAGNOSIS — I4819 Other persistent atrial fibrillation: Secondary | ICD-10-CM | POA: Diagnosis not present

## 2018-05-09 DIAGNOSIS — I2510 Atherosclerotic heart disease of native coronary artery without angina pectoris: Secondary | ICD-10-CM

## 2018-05-09 DIAGNOSIS — I10 Essential (primary) hypertension: Secondary | ICD-10-CM

## 2018-05-09 DIAGNOSIS — Q2381 Bicuspid aortic valve: Secondary | ICD-10-CM

## 2018-05-09 DIAGNOSIS — I2583 Coronary atherosclerosis due to lipid rich plaque: Secondary | ICD-10-CM

## 2018-05-09 DIAGNOSIS — I519 Heart disease, unspecified: Secondary | ICD-10-CM

## 2018-05-09 DIAGNOSIS — Z9889 Other specified postprocedural states: Secondary | ICD-10-CM

## 2018-05-09 DIAGNOSIS — Q231 Congenital insufficiency of aortic valve: Secondary | ICD-10-CM

## 2018-05-09 DIAGNOSIS — Z951 Presence of aortocoronary bypass graft: Secondary | ICD-10-CM

## 2018-05-09 DIAGNOSIS — I6523 Occlusion and stenosis of bilateral carotid arteries: Secondary | ICD-10-CM

## 2018-05-09 DIAGNOSIS — I350 Nonrheumatic aortic (valve) stenosis: Secondary | ICD-10-CM

## 2018-05-09 DIAGNOSIS — Z952 Presence of prosthetic heart valve: Secondary | ICD-10-CM

## 2018-05-09 NOTE — Patient Instructions (Signed)
Medication Instructions:  Your Physician recommend you continue on your current medication as directed.    If you need a refill on your cardiac medications before your next appointment, please call your pharmacy.   Lab work: Your physician recommends that you schedule a follow-up appointment in 1 week (lipid)  If you have labs (blood work) drawn today and your tests are completely normal, you will receive your results only by: Marland Kitchen MyChart Message (if you have MyChart) OR . A paper copy in the mail If you have any lab test that is abnormal or we need to change your treatment, we will call you to review the results.  Testing/Procedures: Your physician has requested that you have an echocardiogram in 3 months. Echocardiography is a painless test that uses sound waves to create images of your heart. It provides your doctor with information about the size and shape of your heart and how well your heart's chambers and valves are working. This procedure takes approximately one hour. There are no restrictions for this procedure. [place] [address]   Follow-Up: At [facility], you and your health needs are our priority.  As part of our continuing mission to provide you with exceptional heart care, we have created designated Provider Care Teams.  These Care Teams include your primary Cardiologist (physician) and Advanced Practice Providers (APPs -  Physician Assistants and Nurse Practitioners) who all work together to provide you with the care you need, when you need it. You will need a follow up appointment in 3 months.  Please call our office 2 months in advance to schedule this appointment.  You may see Buford Dresser, MD or one of the following Advanced Practice Providers on your designated Care Team:   Rosaria Ferries, PA-C . Jory Sims, DNP, ANP

## 2018-05-09 NOTE — Progress Notes (Signed)
Virtual Visit via Telephone Note   This visit type was conducted due to national recommendations for restrictions regarding the COVID-19 Pandemic (e.g. social distancing) in an effort to limit this patient's exposure and mitigate transmission in our community.  Due to his co-morbid illnesses, this patient is at least at moderate risk for complications without adequate follow up.  This format is felt to be most appropriate for this patient at this time.  The patient did not have access to video technology/had technical difficulties with video requiring transitioning to audio format only (telephone).  All issues noted in this document were discussed and addressed.  No physical exam could be performed with this format.  Please refer to the patient's chart for his  consent to telehealth for [facility].   Evaluation Performed:  Follow-up visit  Date:  05/09/2018   ID:  Justin [name], DOB [birth year], MRN [id number]  Patient Location: Home Provider Location: Home  PCP:  [name], MD  Cardiologist:  Buford Dresser, MD Electrophysiologist:  None   Chief Complaint:  Follow up  History of Present Illness:    Justin [name] is a 63 y.o. male with obesity, diabetes, prior tobacco use, hypertension, peripheral vascular disease who is seen in follow up for atrial fibrillation and s/p R CEA and CABG with AVR 01/03/18 for bicuspid aortic valve stenosis  The patient does not have symptoms concerning for COVID-19 infection (fever, chills, cough, or new shortness of breath).   Doing well overall . Has decided to take a package to retire early. Avoiding social situations/appropriately distancing. Missing cardiac rehab, but trying to stay active. Reviewed AHA guidelines for exercise. Medications reviewed, tolerating well, no issues. No bleeding issues, somewhat easier bruising.   Reviewed recommendations for secondary prevention, doing well.   Denies chest pain, shortness of  breath at rest or with normal exertion. No PND, orthopnea, LE edema or unexpected weight gain. No syncope or palpitations. Does have claudication, but no rest pain, no color changes, no nonhealing wounds.  Past Medical History:  Diagnosis Date  . Acute meniscal tear of knee LEFT  . Aortic stenosis 12/01/2017   NONRHEUMATIC, AORTIC VALVE CALCIFICATIONS, MILD TO MODERATE REGURG, MILD TO MODERATE CALCIFIED ANNULUS per ECHO 10/25/17 @ [facility]  . Arthritis   . Atrial fibrillation ([facility]) 11/12/2017   AT O/V WITH PCP  . DM (diabetes mellitus) ([facility])   . Heart murmur MILD-- ASYMPTOMATIC  . Hyperlipidemia   . Hypertension   . Left knee pain   . PAD (peripheral artery disease) (H[facility])    left leg claudication   Past Surgical History:  Procedure Laterality Date  . AORTIC VALVE REPLACEMENT N/A 01/03/2018   Procedure: AORTIC VALVE REPLACEMENT (AVR) using 37mm Magna Ease Bioprosthesis Aortic Valve;  Surgeon: Ivin Poot, MD;  Location: [facility];  Service: Open Heart Surgery;  Laterality: N/A;  . APPENDECTOMY  1998  . CATARACT EXTRACTION W/ INTRAOCULAR LENS  IMPLANT, BILATERAL  1998/  2000  . CERVICAL FUSION  1985   C4 - 5  . CORONARY ARTERY BYPASS GRAFT N/A 01/03/2018   Procedure: CORONARY ARTERY BYPASS GRAFTING (CABG) x 4 (LIMA to LAD, SVG to DIAGONAL, SVG to RAMUS INTERMEDIATE, and SVG to PDA), USING LEFT INFTERNAL MAMMARY ARTERY AND;  Surgeon: Prescott Gum, Collier Salina, MD;  Location: [facility];  Service: Open Heart Surgery;  Laterality: N/A;  . ENDARTERECTOMY Right 01/03/2018   Procedure: ENDARTERECTOMY CAROTID;  Surgeon: Marty Heck, MD;  Location: [facility];  Service:  Vascular;  Laterality: Right;  . KNEE ARTHROSCOPY W/ MENISCECTOMY  1991   LEFT KNEE  . NASAL SINUS SURGERY  1982  . RIGHT/LEFT HEART CATH AND CORONARY ANGIOGRAPHY N/A 12/24/2017   Procedure: RIGHT/LEFT HEART CATH AND CORONARY ANGIOGRAPHY;  Surgeon: Jolaine Artist, MD;  Location: [facility];  Service:  Cardiovascular;  Laterality: N/A;  . ROTATOR CUFF REPAIR  09-04-2005   LEFT SHOULDER  . TEE WITHOUT CARDIOVERSION N/A 12/24/2017   Procedure: TRANSESOPHAGEAL ECHOCARDIOGRAM (TEE);  Surgeon: Jolaine Artist, MD;  Location[facility];  Service: Cardiovascular;  Laterality: N/A;  . TEE WITHOUT CARDIOVERSION N/A 01/03/2018   Procedure: TRANSESOPHAGEAL ECHOCARDIOGRAM (TEE);  Surgeon: Prescott Gum, Collier Salina, MD;  Location: [facility];  Service: Open Heart Surgery;  Laterality: N/A;  . TONSILLECTOMY  AGE 43     Current Meds  Medication Sig  . apixaban (ELIQUIS) 5 MG TABS tablet Take 1 tablet (5 mg total) by mouth 2 (two) times daily.  Marland Kitchen aspirin 81 MG tablet Take 81 mg by mouth daily.   . Cholecalciferol 125 MCG (5000 UT) capsule Take 1 capsule by mouth daily.   . cyclobenzaprine (FLEXERIL) 10 MG tablet Take 10 mg by mouth 3 (three) times daily.   . fluticasone (FLONASE) 50 MCG/ACT nasal spray Place 2 sprays into both nostrils daily.  . hydroxyurea (HYDREA) 500 MG capsule Take 2 capsules in the morning and one capsule at night. May take with food to minimize GI side effects.  Marland Kitchen loratadine (CLARITIN) 10 MG tablet Take 10 mg by mouth daily.  . metFORMIN (GLUCOPHAGE-XR) 500 MG 24 hr tablet Take 500 mg by mouth daily before supper.  . metoprolol succinate (TOPROL XL) 25 MG 24 hr tablet Take 1 tablet (25 mg total) by mouth daily.  . nefazodone (SERZONE) 100 MG tablet Take 100 mg by mouth 2 (two) times daily.  . pregabalin (LYRICA) 50 MG capsule Take 50 mg by mouth 3 (three) times daily.  . rosuvastatin (CRESTOR) 20 MG tablet Take 1 tablet (20 mg total) by mouth daily.  Marland Kitchen zolpidem (AMBIEN) 10 MG tablet Take 10 mg by mouth at bedtime as needed.     Allergies:   Codeine   Social History   Tobacco Use  . Smoking status: Former Smoker    Packs/day: 0.25    Years: 23.00    Pack years: 5.75    Types: Cigars    Last attempt to quit: 01/02/2018    Years since quitting: 0.3  . Smokeless tobacco: Never Used   . Tobacco comment: quit cigs in 2011 and smokes cigars daily- from 3-10 cigars-09/27/13  Substance Use Topics  . Alcohol use: Not Currently    Alcohol/week: 0.0 standard drinks    Comment: not now  . Drug use: No     Family Hx: The patient's family history includes Cancer in his father. He was adopted.  ROS:   Please see the history of present illness.    All other systems reviewed and are negative.   Prior CV studies:   The following studies were reviewed today: Echo, cath, surgery reports  Labs/Other Tests and Data Reviewed:    EKG:  An ECG dated 01/21/18 was personally reviewed today and demonstrated:  afib  Recent Labs: 01/04/2018: Magnesium 2.0 04/14/2018: ALT 8; BUN 9; Creatinine 0.92; Hemoglobin 11.2; Platelet Count 504; Potassium 4.3; Sodium 137   Recent Lipid Panel Lab Results  Component Value Date/Time   CHOL 127 02/17/2018 10:14 AM   TRIG 80 02/17/2018  10:14 AM   HDL 41 02/17/2018 10:14 AM   CHOLHDL 3.1 02/17/2018 10:14 AM   LDLCALC 70 02/17/2018 10:14 AM    Wt Readings from Last 3 Encounters:  05/09/18 192 lb (87.1 kg)  04/14/18 200 lb 12.8 oz (91.1 kg)  03/17/18 193 lb (87.5 kg)     Objective:    Vital Signs:  BP 134/67   Pulse 62   Ht 5\' 8"  (1.727 m)   Wt 192 lb (87.1 kg)   BMI 29.19 kg/m     ASSESSMENT & PLAN:   Left ventricular systolic dysfunction without heart failure - Plan: ECHOCARDIOGRAM COMPLETE  Persistent atrial fibrillation  Bicuspid aortic valve determined by imaging  Nonrheumatic aortic valve stenosis  Essential hypertension  Coronary artery disease due to lipid rich plaque  Bilateral carotid artery stenosis  S/P CABG x 4  S/P AVR (aortic valve replacement)  History of CEA (carotid endarterectomy)  Claudication (HCC)  Medication management - Plan: Lipid panel    S/P R CEA, CAD s/p CABG x4, AVR (for bicuspid AV stenosis) 12/2017:              -cardiac rehab on hold given COVID             -on metoprolol  succinate given systolic dysfunction without clinical heart failure             -on 81 mg aspirin given need for anticoagulation             -on rosuvastatin 20 mg (changed previously) due to risk of atorvastatin interaction with nefazodone. Recheck lipids with next cancer center draw (has CMP done routinely)  Atrial fibrillation: rate controlled, does not notice palpitations. Chadsvasc=3, s/p LA clip. His polycythemia also places him at higher risk of clot as well.  -Continue rate control with metoprolol, anticoagulation with apixaban   Likely sleep apnea: Epworth sleepiness score=12. High likelihood of sleep apnea given body habitus and comorbidities. He feels like now that his schedule with normalize in retirement, his sleep issues may improve             -ordered sleep study--cancelled by patient, wants to wait for a few months to see how he does before re-ordering  Claudication: continues, but no rest pain/nonhealing wounds. -quit smoking. On aspirin and high intensity statin. ABIs showed moderate disease on the right, severe disease on the left. Discussed exercise guidelines. Continue to follow  Hypertension:has been at goal, borderline today. On metoprolol only.  -Would consider adding ACEI given diabetes, vascular disease, if LV dysfunction does not improve 3 mos post revascularization.  Secondary Prevention -recommend heart healthy/Mediterranean diet, with whole grains, fruits, vegetable, fish, lean meats, nuts, and olive oil. Limit salt. -recommend moderate walking, 3-5 times/week for 30-50 minutes each session. Aim for at least 150 minutes.week. Goal should be pace of 3 miles/hours, or walking 1.5 miles in 30 minutes -recommend avoidance of tobacco products. Avoid excess alcohol. -Additional risk factor control:             -Diabetes: A1c is 6.5. Consider SGLT2 inhibitor at next visit             -Lipids:recheck at next draw             -Blood pressure control: as above              -Weight: BMI 29. Working on [employer] loss   COVID-19 Education: The signs and symptoms of COVID-19 were discussed with the patient and how  to seek care for testing (follow up with PCP or arrange E-visit).  The importance of social distancing was discussed today.  Time:   Today, I have spent 26 minutes with the patient with telehealth technology discussing the above problems.    Patient Instructions  Medication Instructions:  Your Physician recommend you continue on your current medication as directed.    If you need a refill on your cardiac medications before your next appointment, please call your pharmacy.   Lab work: Your physician recommends that you schedule a follow-up appointment in 1 week (lipid)  If you have labs (blood work) drawn today and your tests are completely normal, you will receive your results only by: Marland Kitchen MyChart Message (if you have MyChart) OR . A paper copy in the mail If you have any lab test that is abnormal or we need to change your treatment, we will call you to review the results.  Testing/Procedures: Your physician has requested that you have an echocardiogram in 3 months. Echocardiography is a painless test that uses sound waves to create images of your heart. It provides your doctor with information about the size and shape of your heart and how well your heart's chambers and valves are working. This procedure takes approximately one hour. There are no restrictions for this procedure. [place] [address]   Follow-Up: At [facility], you and your health needs are our priority.  As part of our continuing mission to provide you with exceptional heart care, we have created designated Provider Care Teams.  These Care Teams include your primary Cardiologist (physician) and Advanced Practice Providers (APPs -  Physician Assistants and Nurse Practitioners) who all work together to provide you with the care you need, when you need it. You will need  a follow up appointment in 3 months.  Please call our office 2 months in advance to schedule this appointment.  You may see Buford Dresser, MD or one of the following Advanced Practice Providers on your designated Care Team:   Rosaria Ferries, PA-C . Jory Sims, DNP, ANP        Medication Adjustments/Labs and Tests Ordered: Current medicines are reviewed at length with the patient today.  Concerns regarding medicines are outlined above.   Tests Ordered: Orders Placed This Encounter  Procedures  . Lipid panel  . ECHOCARDIOGRAM COMPLETE    Medication Changes: No orders of the defined types were placed in this encounter.   Disposition:  Follow up 3 mos  Signed, Buford Dresser, MD  05/09/2018 10:07 AM    [facility]

## 2018-05-11 ENCOUNTER — Ambulatory Visit (HOSPITAL_COMMUNITY): Payer: Self-pay

## 2018-05-11 ENCOUNTER — Encounter (HOSPITAL_COMMUNITY): Payer: BLUE CROSS/BLUE SHIELD

## 2018-05-12 ENCOUNTER — Ambulatory Visit: Payer: BLUE CROSS/BLUE SHIELD | Admitting: Cardiology

## 2018-05-13 ENCOUNTER — Encounter (HOSPITAL_COMMUNITY): Payer: BLUE CROSS/BLUE SHIELD

## 2018-05-13 ENCOUNTER — Ambulatory Visit (HOSPITAL_COMMUNITY): Payer: Self-pay

## 2018-05-16 ENCOUNTER — Encounter (HOSPITAL_COMMUNITY): Payer: BLUE CROSS/BLUE SHIELD

## 2018-05-16 ENCOUNTER — Ambulatory Visit (HOSPITAL_COMMUNITY): Payer: Self-pay

## 2018-05-18 ENCOUNTER — Encounter (HOSPITAL_COMMUNITY): Payer: BLUE CROSS/BLUE SHIELD

## 2018-05-18 ENCOUNTER — Ambulatory Visit (HOSPITAL_COMMUNITY): Payer: Self-pay

## 2018-05-19 ENCOUNTER — Inpatient Hospital Stay: Payer: BLUE CROSS/BLUE SHIELD

## 2018-05-19 ENCOUNTER — Other Ambulatory Visit: Payer: Self-pay

## 2018-05-19 ENCOUNTER — Inpatient Hospital Stay: Payer: BLUE CROSS/BLUE SHIELD | Attending: Hematology and Oncology | Admitting: Hematology

## 2018-05-19 ENCOUNTER — Encounter: Payer: Self-pay | Admitting: Hematology

## 2018-05-19 VITALS — BP 109/72 | HR 86 | Resp 19

## 2018-05-19 DIAGNOSIS — Z7984 Long term (current) use of oral hypoglycemic drugs: Secondary | ICD-10-CM | POA: Insufficient documentation

## 2018-05-19 DIAGNOSIS — D72829 Elevated white blood cell count, unspecified: Secondary | ICD-10-CM | POA: Insufficient documentation

## 2018-05-19 DIAGNOSIS — Z79899 Other long term (current) drug therapy: Secondary | ICD-10-CM | POA: Diagnosis not present

## 2018-05-19 DIAGNOSIS — I350 Nonrheumatic aortic (valve) stenosis: Secondary | ICD-10-CM | POA: Diagnosis not present

## 2018-05-19 DIAGNOSIS — Z7901 Long term (current) use of anticoagulants: Secondary | ICD-10-CM | POA: Diagnosis not present

## 2018-05-19 DIAGNOSIS — I2510 Atherosclerotic heart disease of native coronary artery without angina pectoris: Secondary | ICD-10-CM

## 2018-05-19 DIAGNOSIS — D75839 Thrombocytosis, unspecified: Secondary | ICD-10-CM

## 2018-05-19 DIAGNOSIS — Z7982 Long term (current) use of aspirin: Secondary | ICD-10-CM | POA: Diagnosis not present

## 2018-05-19 DIAGNOSIS — D473 Essential (hemorrhagic) thrombocythemia: Secondary | ICD-10-CM

## 2018-05-19 DIAGNOSIS — D45 Polycythemia vera: Secondary | ICD-10-CM

## 2018-05-19 LAB — CBC WITH DIFFERENTIAL (CANCER CENTER ONLY)
Abs Immature Granulocytes: 0.95 10*3/uL — ABNORMAL HIGH (ref 0.00–0.07)
Basophils Absolute: 0.2 10*3/uL — ABNORMAL HIGH (ref 0.0–0.1)
Basophils Relative: 1 %
Eosinophils Absolute: 0.1 10*3/uL (ref 0.0–0.5)
Eosinophils Relative: 1 %
HCT: 32.1 % — ABNORMAL LOW (ref 39.0–52.0)
Hemoglobin: 10.3 g/dL — ABNORMAL LOW (ref 13.0–17.0)
Immature Granulocytes: 5 %
Lymphocytes Relative: 8 %
Lymphs Abs: 1.5 10*3/uL (ref 0.7–4.0)
MCH: 32.8 pg (ref 26.0–34.0)
MCHC: 32.1 g/dL (ref 30.0–36.0)
MCV: 102.2 fL — ABNORMAL HIGH (ref 80.0–100.0)
Monocytes Absolute: 1.1 10*3/uL — ABNORMAL HIGH (ref 0.1–1.0)
Monocytes Relative: 6 %
Neutro Abs: 15.2 10*3/uL — ABNORMAL HIGH (ref 1.7–7.7)
Neutrophils Relative %: 79 %
Platelet Count: 275 10*3/uL (ref 150–400)
RBC: 3.14 MIL/uL — ABNORMAL LOW (ref 4.22–5.81)
RDW: 19.9 % — ABNORMAL HIGH (ref 11.5–15.5)
WBC Count: 19 10*3/uL — ABNORMAL HIGH (ref 4.0–10.5)
nRBC: 0.1 % (ref 0.0–0.2)

## 2018-05-19 LAB — CMP (CANCER CENTER ONLY)
ALT: 7 U/L (ref 0–44)
AST: 11 U/L — ABNORMAL LOW (ref 15–41)
Albumin: 3.5 g/dL (ref 3.5–5.0)
Alkaline Phosphatase: 90 U/L (ref 38–126)
Anion gap: 9 (ref 5–15)
BUN: 11 mg/dL (ref 8–23)
CO2: 26 mmol/L (ref 22–32)
Calcium: 9.3 mg/dL (ref 8.9–10.3)
Chloride: 98 mmol/L (ref 98–111)
Creatinine: 0.65 mg/dL (ref 0.61–1.24)
GFR, Est AFR Am: >60 mL/min (ref >60)
GFR, Estimated: >60 mL/min (ref >60)
Glucose, Bld: 158 mg/dL — ABNORMAL HIGH (ref 70–99)
Potassium: 4.2 mmol/L (ref 3.5–5.1)
Sodium: 133 mmol/L — ABNORMAL LOW (ref 135–145)
Total Bilirubin: 0.5 mg/dL (ref 0.3–1.2)
Total Protein: 7.2 g/dL (ref 6.5–8.1)

## 2018-05-19 LAB — LACTATE DEHYDROGENASE: LDH: 242 U/L — ABNORMAL HIGH (ref 98–192)

## 2018-05-19 NOTE — Progress Notes (Signed)
[facility] OFFICE PROGRESS NOTE  Patient Care Team: [name], MD as PCP - General (Family Medicine) Buford Dresser, MD as PCP - Cardiology (Cardiology)  HEME/ONC OVERVIEW: 1. Polycythemia vera, JAK2 V617 mutation+; high risk (age >62) -Previous patient of Dr. Audelia Hives  -11/2017: Hgb ~17; WBC ~30k w/ nl diff, plts ~500k; NGS showed JAK2 V617F mutation (83% allele frequency), CAL-R mutation (4% allele frequency); BCR/ALB negative; no bone marrow biopsy done at diagnosis  -11/2017 - present: Hydrea, see dosing below -12/2017 - present: phlebotomy x 1   TREATMENT REGIMEN:  12/03/2017 - present: Hydrea, currently 542m TID  12/2017 - present: PRN phlebotomy; most recent on 12/23/2017  PERTINENT NON-HEM/ONC PROBLEMS: 1. CAD s/p CABG x 4 in 12/2017 2. Moderate to severe aortic stenosis s/p aortic valve replacement and left atrial clip in 12/2017   ASSESSMENT & PLAN:   Polycythemia vera, JAK2+; high risk  -Previous patient of Dr. RAudelia Hives no bone marrow bx at diagnosis  -Hct 32.1 today; no indication for phlebotomy today -Goal Hct < 45%  -See dose adjustment of Hydrea below -On ASA 878mdaily  Thrombocytosis  -Secondary to PV -Plts 275k today, improving -Currently on Hydrea 50063mID   Leukocytosis -Secondary to PV -WBC 19.0k, fluctuating  -Patient denies any symptoms of infection -See Hydrea dose adjustment as outlined above -If leukocytosis persists despite normalization of Hgb and plt count, or patient develops disproportionate cytopenias, we may consider bone marrow bx to assess for any evidence of early myelofibrosis  No orders of the defined types were placed in this encounter.  All questions were answered. The patient knows to call the clinic with any problems, questions or concerns. No barriers to learning was detected.  A total of more than 25 minutes were spent face-to-face with the patient during this encounter and over half of that time was  spent on counseling and coordination of care as outlined above.   [name] 05/19/2018 9:21 AM  CHIEF COMPLAINT: "I am doing fine"  INTERVAL HISTORY: Mr. PerRaineriturns to clinic for follow-up of PVR Hydrea.  Patient reports that he has been tolerating Hydrea relatively well since last visit, and denies any significant side effects.  He is currently taking Hydrea 500 mg every morning and 1000 mg nightly, but would like to take 500 mg TID with meals.  His mother in law recently moved in with them, which has been causing some stress at home.  He denies any other complaint today.   REVIEW OF SYSTEMS:   Constitutional: ( - ) fevers, ( - )  chills , ( - ) night sweats Eyes: ( - ) blurriness of vision, ( - ) double vision, ( - ) watery eyes Ears, nose, mouth, throat, and face: ( - ) mucositis, ( - ) sore throat Respiratory: ( - ) cough, ( - ) dyspnea, ( - ) wheezes Cardiovascular: ( - ) palpitation, ( - ) chest discomfort, ( - ) lower extremity swelling Gastrointestinal:  ( - ) nausea, ( - ) heartburn, ( - ) change in bowel habits Skin: ( + ) abnormal skin rashes Lymphatics: ( - ) new lymphadenopathy, ( - ) easy bruising Neurological: ( - ) numbness, ( - ) tingling, ( - ) new weaknesses Behavioral/Psych: ( - ) mood change, ( - ) new changes  All other systems were reviewed with the patient and are negative.  I have reviewed the past medical history, past surgical history, social history and family history with the  patient and they are unchanged from previous note.  ALLERGIES:  is allergic to codeine.  MEDICATIONS:  Current Outpatient Medications  Medication Sig Dispense Refill  . apixaban (ELIQUIS) 5 MG TABS tablet Take 1 tablet (5 mg total) by mouth 2 (two) times daily.    Marland Kitchen aspirin 81 MG tablet Take 81 mg by mouth daily.     . Cholecalciferol 125 MCG (5000 UT) capsule Take 1 capsule by mouth daily.     . cyclobenzaprine (FLEXERIL) 10 MG tablet Take 10 mg by mouth 3 (three) times  daily.     . fluticasone (FLONASE) 50 MCG/ACT nasal spray Place 2 sprays into both nostrils daily.    . hydroxyurea (HYDREA) 500 MG capsule Take 2 capsules in the morning and one capsule at night. May take with food to minimize GI side effects. 90 capsule 1  . loratadine (CLARITIN) 10 MG tablet Take 10 mg by mouth daily.    . metFORMIN (GLUCOPHAGE-XR) 500 MG 24 hr tablet Take 500 mg by mouth daily before supper.    . metoprolol succinate (TOPROL XL) 25 MG 24 hr tablet Take 1 tablet (25 mg total) by mouth daily. 90 tablet 3  . nefazodone (SERZONE) 100 MG tablet Take 100 mg by mouth 2 (two) times daily.    . pregabalin (LYRICA) 50 MG capsule Take 50 mg by mouth 3 (three) times daily.    . rosuvastatin (CRESTOR) 20 MG tablet Take 1 tablet (20 mg total) by mouth daily. 90 tablet 3  . zolpidem (AMBIEN) 10 MG tablet Take 10 mg by mouth at bedtime as needed.     No current facility-administered medications for this visit.     PHYSICAL EXAMINATION: ECOG PERFORMANCE STATUS: 1 - Symptomatic but completely ambulatory  There were no vitals filed for this visit. There is no height or weight on file to calculate BMI.  There were no vitals filed for this visit.  GENERAL: alert, no distress and comfortable SKIN: chronically sun-damaged skin over bilateral forearms EYES: conjunctiva are pink and non-injected, sclera clear OROPHARYNX: no exudate, no erythema; lips, buccal mucosa, and tongue normal  NECK: supple, non-tender LUNGS: clear to auscultation with normal breathing effort HEART: regular rate & rhythm and no murmurs and no lower extremity edema ABDOMEN: soft, non-tender, non-distended, normal bowel sounds Musculoskeletal: no cyanosis of digits and no clubbing  PSYCH: alert & oriented x 3, fluent speech NEURO: no focal motor/sensory deficits  LABORATORY DATA:  I have reviewed the data as listed    Component Value Date/Time   NA 137 04/14/2018 0958   K 4.3 04/14/2018 0958   CL 100  04/14/2018 0958   CO2 28 04/14/2018 0958   GLUCOSE 119 (H) 04/14/2018 0958   BUN 9 04/14/2018 0958   CREATININE 0.92 04/14/2018 0958   CALCIUM 9.4 04/14/2018 0958   PROT 7.3 04/14/2018 0958   ALBUMIN 3.7 04/14/2018 0958   AST 15 04/14/2018 0958   ALT 8 04/14/2018 0958   ALKPHOS 94 04/14/2018 0958   BILITOT 0.4 04/14/2018 0958   GFRNONAA >60 04/14/2018 0958   GFRAA >60 04/14/2018 0958    No results found for: SPEP, UPEP  Lab Results  Component Value Date   WBC 14.0 (H) 04/14/2018   NEUTROABS 9.9 (H) 04/14/2018   HGB 11.2 (L) 04/14/2018   HCT 35.6 (L) 04/14/2018   MCV 100.3 (H) 04/14/2018   PLT 504 (H) 04/14/2018      Chemistry      Component Value Date/Time  NA 137 04/14/2018 0958   K 4.3 04/14/2018 0958   CL 100 04/14/2018 0958   CO2 28 04/14/2018 0958   BUN 9 04/14/2018 0958   CREATININE 0.92 04/14/2018 0958      Component Value Date/Time   CALCIUM 9.4 04/14/2018 0958   ALKPHOS 94 04/14/2018 0958   AST 15 04/14/2018 0958   ALT 8 04/14/2018 0958   BILITOT 0.4 04/14/2018 9574

## 2018-05-20 ENCOUNTER — Ambulatory Visit (HOSPITAL_COMMUNITY): Payer: Self-pay

## 2018-05-20 ENCOUNTER — Encounter (HOSPITAL_COMMUNITY): Payer: BLUE CROSS/BLUE SHIELD

## 2018-05-23 ENCOUNTER — Encounter (HOSPITAL_COMMUNITY): Payer: BLUE CROSS/BLUE SHIELD

## 2018-05-23 ENCOUNTER — Ambulatory Visit (HOSPITAL_COMMUNITY): Payer: Self-pay

## 2018-05-25 ENCOUNTER — Ambulatory Visit (HOSPITAL_COMMUNITY): Payer: Self-pay

## 2018-05-25 ENCOUNTER — Encounter (HOSPITAL_COMMUNITY): Payer: BLUE CROSS/BLUE SHIELD

## 2018-05-27 ENCOUNTER — Encounter (HOSPITAL_COMMUNITY): Payer: BLUE CROSS/BLUE SHIELD

## 2018-05-27 ENCOUNTER — Ambulatory Visit (HOSPITAL_COMMUNITY): Payer: Self-pay

## 2018-05-30 ENCOUNTER — Encounter (HOSPITAL_COMMUNITY): Payer: BLUE CROSS/BLUE SHIELD

## 2018-05-30 ENCOUNTER — Ambulatory Visit (HOSPITAL_COMMUNITY): Payer: Self-pay

## 2018-06-01 ENCOUNTER — Encounter (HOSPITAL_COMMUNITY): Payer: BLUE CROSS/BLUE SHIELD

## 2018-06-01 ENCOUNTER — Ambulatory Visit (HOSPITAL_COMMUNITY): Payer: Self-pay

## 2018-06-03 ENCOUNTER — Ambulatory Visit (HOSPITAL_COMMUNITY): Payer: Self-pay

## 2018-06-03 ENCOUNTER — Encounter (HOSPITAL_COMMUNITY): Payer: BLUE CROSS/BLUE SHIELD

## 2018-06-06 ENCOUNTER — Ambulatory Visit (HOSPITAL_COMMUNITY): Payer: Self-pay

## 2018-06-06 ENCOUNTER — Encounter (HOSPITAL_COMMUNITY): Payer: BLUE CROSS/BLUE SHIELD

## 2018-06-08 ENCOUNTER — Ambulatory Visit (HOSPITAL_COMMUNITY): Payer: Self-pay

## 2018-06-08 ENCOUNTER — Encounter (HOSPITAL_COMMUNITY): Payer: BLUE CROSS/BLUE SHIELD

## 2018-06-09 NOTE — Progress Notes (Signed)
[name] PROGRESS NOTE  Patient Care Team: [name], MD as PCP - General (Family Medicine) Buford Dresser, MD as PCP - Cardiology (Cardiology)  HEME/ONC OVERVIEW: 1. Polycythemia vera, JAK2 V617 mutation+; high risk (age >78) -Previous patient of Dr. Audelia Hives  -11/2017: Hgb ~17; WBC ~30k w/ nl diff, plts ~500k; NGS showed JAK2 V617F mutation (83% allele frequency), CAL-R mutation (4% allele frequency); BCR/ALB negative; no bone marrow biopsy done at diagnosis  -11/2017 - present: Hydrea, see dosing below -12/2017 - present: phlebotomy x 1   TREATMENT REGIMEN:  12/03/2017 - present: Hydrea, currently 551m TID  12/2017 - present: PRN phlebotomy; most recent on 12/23/2017  PERTINENT NON-HEM/ONC PROBLEMS: 1. CAD s/p CABG x 4 in 12/2017 2. Moderate to severe aortic stenosis s/p aortic valve replacement and left atrial clip in 12/2017   ASSESSMENT & PLAN:   Polycythemia vera, JAK2+; high risk  -Previous patient of Dr. RAudelia Hives-Hct 35, improving; no indication for phlebotomy -Goal Hct < 45% -See Hydrea dosing below -On ASA 823mdaily   Thrombocytosis  -Secondary to PV  -Plts 155k today, improving  -Currently on Hydrea 50014mID  -Continue the current dose for now   Leukocytosis -Likely secondary to PV -WBC 10.2k today, improving -Patient denies any symptoms of infection -Continue Hydrea as above -If the leukocytosis persists despite normalization of Hgb and plts, or patient develops any disproportionate cytopenias, we may consider bone marrow bx to assess for any evidence of early MF   Coffee-ground emesis -Patient reports that intermittent coffee ground colored oral secretions (usually when he wakes up in the morning) since 12/2017 after he was started on ASA/Plavix for his CAD -Given the dual antiplatelet therapy, he is at increased risk of bleeding -No prior EGD -I have referred the patient to gastroenterology for further  evaluation  Actinic keratosis -Patient has hx of squamous cell carcinoma of the skin as well as actinic keratoses -I counseled the patient about importance of avoiding prolonged sun exposure, as hydroxyurea increases risk of developing skin cancer -I will refer the patient to dermatology for further evaluation  Orders Placed This Encounter  Procedures  . Ambulatory referral to Dermatology    Referral Priority:   Routine    Referral Type:   Consultation    Referral Reason:   Specialty Services Required    Requested Specialty:   Dermatology    Number of Visits Requested:   1  . Ambulatory referral to Gastroenterology    Referral Priority:   Routine    Referral Type:   Consultation    Referral Reason:   Specialty Services Required    Referred to Provider:   CirLavena BullionO    Number of Visits Requested:   1   All questions were answered. The patient knows to call the clinic with any problems, questions or concerns. No barriers to learning was detected.  A total of more than 25 minutes were spent face-to-face with the patient during this encounter and over half of that time was spent on counseling and coordination of care as outlined above.   Return in 1 month for labs and clinic follow-up.   [name] 06/16/2018 11:49 AM  CHIEF COMPLAINT: "I am doing fine"  INTERVAL HISTORY: [name] to clinic for follow-up of PVR Hydrea.  Patient reports that he has noticed intermittent dark brown material in his mouth, usually while he wakes up in the morning, since December 2019 after he underwent coronary artery  bypass and was started on aspirin and Plavix.  He denies any significant abdominal pain, nausea, vomiting, hematochezia, or melena.  He has never had any endoscopy before.  He also reports that he has gone on several rides on his motorcycle without sunscreen, and over the past weeks, he has noticed significant rash over the bilateral forearms that resemble his previous  pre-skin cancer.  He denies any significant rash in other parts of the body.  He denies any other complaint today.  SUMMARY OF ONCOLOGIC HISTORY:  No history exists.    REVIEW OF SYSTEMS:   Constitutional: ( - ) fevers, ( - )  chills , ( - ) night sweats Eyes: ( - ) blurriness of vision, ( - ) double vision, ( - ) watery eyes Ears, nose, mouth, throat, and face: ( - ) mucositis, ( - ) sore throat Respiratory: ( - ) cough, ( - ) dyspnea, ( - ) wheezes Cardiovascular: ( - ) palpitation, ( - ) chest discomfort, ( - ) lower extremity swelling Gastrointestinal:  ( - ) nausea, ( - ) heartburn, ( - ) change in bowel habits Skin: ( + ) abnormal skin rashes Lymphatics: ( - ) new lymphadenopathy, ( - ) easy bruising Neurological: ( - ) numbness, ( - ) tingling, ( - ) new weaknesses Behavioral/Psych: ( - ) mood change, ( - ) new changes  All other systems were reviewed with the patient and are negative.  I have reviewed the past medical history, past surgical history, social history and family history with the patient and they are unchanged from previous note.  ALLERGIES:  is allergic to codeine.  MEDICATIONS:  Current Outpatient Medications  Medication Sig Dispense Refill  . apixaban (ELIQUIS) 5 MG TABS tablet Take 1 tablet (5 mg total) by mouth 2 (two) times daily.    Marland Kitchen aspirin 81 MG tablet Take 81 mg by mouth daily.     . Cholecalciferol 125 MCG (5000 UT) capsule Take 1 capsule by mouth daily.     . cyclobenzaprine (FLEXERIL) 10 MG tablet Take 10 mg by mouth 3 (three) times daily.     . fluticasone (FLONASE) 50 MCG/ACT nasal spray Place 2 sprays into both nostrils daily.    . furosemide (LASIX) 20 MG tablet PLEASE SEE ATTACHED FOR DETAILED DIRECTIONS    . hydroxyurea (HYDREA) 500 MG capsule Take 2 capsules in the morning and one capsule at night. May take with food to minimize GI side effects. 90 capsule 1  . loratadine (CLARITIN) 10 MG tablet Take 10 mg by mouth daily.    . metFORMIN  (GLUCOPHAGE-XR) 500 MG 24 hr tablet Take 500 mg by mouth daily before supper.    . metoprolol succinate (TOPROL XL) 25 MG 24 hr tablet Take 1 tablet (25 mg total) by mouth daily. 90 tablet 3  . nefazodone (SERZONE) 100 MG tablet Take 100 mg by mouth 2 (two) times daily.    . pregabalin (LYRICA) 50 MG capsule Take 50 mg by mouth 3 (three) times daily.    . rosuvastatin (CRESTOR) 20 MG tablet Take 1 tablet (20 mg total) by mouth daily. 90 tablet 3  . zolpidem (AMBIEN) 10 MG tablet Take 10 mg by mouth at bedtime as needed.     No current facility-administered medications for this visit.     PHYSICAL EXAMINATION: ECOG PERFORMANCE STATUS: 1 - Symptomatic but completely ambulatory  Today's Vitals   06/16/18 1136 06/16/18 1137  BP: 113/81   Pulse: 91  Resp: 19   SpO2: 98%   Weight: 198 lb 1.9 oz (89.9 kg)   Height: 5' 8" (1.727 m)   PainSc: 0-No pain 0-No pain   Body mass index is 30.12 kg/m.  Filed Weights   06/16/18 1136  Weight: 198 lb 1.9 oz (89.9 kg)    GENERAL: alert, no distress and comfortable SKIN: Raised, erythematous skin lesions over bilateral axillary arms, consistent with actinic keratosis EYES: conjunctiva are pink and non-injected, sclera clear OROPHARYNX: no exudate, no erythema; lips, buccal mucosa, and tongue normal  NECK: supple, non-tender LUNGS: clear to auscultation with normal breathing effort HEART: regular rate & rhythm and no murmurs and no lower extremity edema ABDOMEN: soft, non-tender, non-distended, normal bowel sounds Musculoskeletal: no cyanosis of digits and no clubbing  PSYCH: alert & oriented x 3, fluent speech NEURO: no focal motor/sensory deficits  LABORATORY DATA:  I have reviewed the data as listed    Component Value Date/Time   NA 135 06/16/2018 1034   K 4.3 06/16/2018 1034   CL 98 06/16/2018 1034   CO2 30 06/16/2018 1034   GLUCOSE 120 (H) 06/16/2018 1034   BUN 14 06/16/2018 1034   CREATININE 0.87 06/16/2018 1034   CALCIUM 9.8  06/16/2018 1034   PROT 8.3 (H) 06/16/2018 1034   ALBUMIN 4.3 06/16/2018 1034   AST 15 06/16/2018 1034   ALT 7 06/16/2018 1034   ALKPHOS 87 06/16/2018 1034   BILITOT 0.6 06/16/2018 1034   GFRNONAA >60 06/16/2018 1034   GFRAA >60 06/16/2018 1034    No results found for: SPEP, UPEP  Lab Results  Component Value Date   WBC 10.2 06/16/2018   NEUTROABS 6.9 06/16/2018   HGB 11.5 (L) 06/16/2018   HCT 35.6 (L) 06/16/2018   MCV 107.2 (H) 06/16/2018   PLT 155 06/16/2018      Chemistry      Component Value Date/Time   NA 135 06/16/2018 1034   K 4.3 06/16/2018 1034   CL 98 06/16/2018 1034   CO2 30 06/16/2018 1034   BUN 14 06/16/2018 1034   CREATININE 0.87 06/16/2018 1034      Component Value Date/Time   CALCIUM 9.8 06/16/2018 1034   ALKPHOS 87 06/16/2018 1034   AST 15 06/16/2018 1034   ALT 7 06/16/2018 1034   BILITOT 0.6 06/16/2018 1034

## 2018-06-10 ENCOUNTER — Ambulatory Visit (HOSPITAL_COMMUNITY): Payer: Self-pay

## 2018-06-10 ENCOUNTER — Encounter (HOSPITAL_COMMUNITY): Payer: BLUE CROSS/BLUE SHIELD

## 2018-06-14 ENCOUNTER — Telehealth (HOSPITAL_COMMUNITY): Payer: Self-pay | Admitting: *Deleted

## 2018-06-15 ENCOUNTER — Encounter (HOSPITAL_COMMUNITY): Payer: Self-pay

## 2018-06-15 NOTE — Progress Notes (Signed)
Transitioning to virtual cardiac rehab  Dr.  Harrell Gave   As you are aware our department remains closed to patients due to Covid-19.  We are excited to be able to offer an alternative to traditional onsite Cardiac Rehab while your patient continues to follow Re-Open guidelines.  This is a notification that your patient has been contacted and is very interested in participating in Virtual Cardiac Rehab.  Thank you for your continued support in helping [place] meet the health care needs of our patients.  Tedra [place]. Support Rep II   Cardiac Rehab staff

## 2018-06-16 ENCOUNTER — Other Ambulatory Visit: Payer: Self-pay

## 2018-06-16 ENCOUNTER — Inpatient Hospital Stay: Payer: BLUE CROSS/BLUE SHIELD | Attending: Hematology and Oncology | Admitting: Hematology

## 2018-06-16 ENCOUNTER — Inpatient Hospital Stay: Payer: BLUE CROSS/BLUE SHIELD

## 2018-06-16 ENCOUNTER — Encounter: Payer: Self-pay | Admitting: Hematology

## 2018-06-16 VITALS — BP 113/81 | HR 91 | Resp 19 | Ht 68.0 in | Wt 198.1 lb

## 2018-06-16 DIAGNOSIS — I2510 Atherosclerotic heart disease of native coronary artery without angina pectoris: Secondary | ICD-10-CM

## 2018-06-16 DIAGNOSIS — R1110 Vomiting, unspecified: Secondary | ICD-10-CM | POA: Diagnosis not present

## 2018-06-16 DIAGNOSIS — D45 Polycythemia vera: Secondary | ICD-10-CM | POA: Diagnosis not present

## 2018-06-16 DIAGNOSIS — D75839 Thrombocytosis, unspecified: Secondary | ICD-10-CM

## 2018-06-16 DIAGNOSIS — L570 Actinic keratosis: Secondary | ICD-10-CM | POA: Insufficient documentation

## 2018-06-16 DIAGNOSIS — Z7901 Long term (current) use of anticoagulants: Secondary | ICD-10-CM

## 2018-06-16 DIAGNOSIS — Z79899 Other long term (current) drug therapy: Secondary | ICD-10-CM | POA: Insufficient documentation

## 2018-06-16 DIAGNOSIS — Z7982 Long term (current) use of aspirin: Secondary | ICD-10-CM | POA: Insufficient documentation

## 2018-06-16 DIAGNOSIS — R21 Rash and other nonspecific skin eruption: Secondary | ICD-10-CM | POA: Diagnosis not present

## 2018-06-16 DIAGNOSIS — D72829 Elevated white blood cell count, unspecified: Secondary | ICD-10-CM | POA: Insufficient documentation

## 2018-06-16 DIAGNOSIS — R7989 Other specified abnormal findings of blood chemistry: Secondary | ICD-10-CM | POA: Diagnosis not present

## 2018-06-16 DIAGNOSIS — D473 Essential (hemorrhagic) thrombocythemia: Secondary | ICD-10-CM

## 2018-06-16 DIAGNOSIS — K920 Hematemesis: Secondary | ICD-10-CM

## 2018-06-16 LAB — CBC WITH DIFFERENTIAL (CANCER CENTER ONLY)
Abs Immature Granulocytes: 0.18 10*3/uL — ABNORMAL HIGH (ref 0.00–0.07)
Basophils Absolute: 0.2 10*3/uL — ABNORMAL HIGH (ref 0.0–0.1)
Basophils Relative: 2 %
Eosinophils Absolute: 0 10*3/uL (ref 0.0–0.5)
Eosinophils Relative: 0 %
HCT: 35.6 % — ABNORMAL LOW (ref 39.0–52.0)
Hemoglobin: 11.5 g/dL — ABNORMAL LOW (ref 13.0–17.0)
Immature Granulocytes: 2 %
Lymphocytes Relative: 19 %
Lymphs Abs: 2 10*3/uL (ref 0.7–4.0)
MCH: 34.6 pg — ABNORMAL HIGH (ref 26.0–34.0)
MCHC: 32.3 g/dL (ref 30.0–36.0)
MCV: 107.2 fL — ABNORMAL HIGH (ref 80.0–100.0)
Monocytes Absolute: 1 10*3/uL (ref 0.1–1.0)
Monocytes Relative: 10 %
Neutro Abs: 6.9 10*3/uL (ref 1.7–7.7)
Neutrophils Relative %: 67 %
Platelet Count: 155 10*3/uL (ref 150–400)
RBC: 3.32 MIL/uL — ABNORMAL LOW (ref 4.22–5.81)
RDW: 20.2 % — ABNORMAL HIGH (ref 11.5–15.5)
WBC Count: 10.2 10*3/uL (ref 4.0–10.5)
nRBC: 0 % (ref 0.0–0.2)

## 2018-06-16 LAB — CMP (CANCER CENTER ONLY)
ALT: 7 U/L (ref 0–44)
AST: 15 U/L (ref 15–41)
Albumin: 4.3 g/dL (ref 3.5–5.0)
Alkaline Phosphatase: 87 U/L (ref 38–126)
Anion gap: 7 (ref 5–15)
BUN: 14 mg/dL (ref 8–23)
CO2: 30 mmol/L (ref 22–32)
Calcium: 9.8 mg/dL (ref 8.9–10.3)
Chloride: 98 mmol/L (ref 98–111)
Creatinine: 0.87 mg/dL (ref 0.61–1.24)
GFR, Est AFR Am: >60 mL/min (ref >60)
GFR, Estimated: >60 mL/min (ref >60)
Glucose, Bld: 120 mg/dL — ABNORMAL HIGH (ref 70–99)
Potassium: 4.3 mmol/L (ref 3.5–5.1)
Sodium: 135 mmol/L (ref 135–145)
Total Bilirubin: 0.6 mg/dL (ref 0.3–1.2)
Total Protein: 8.3 g/dL — ABNORMAL HIGH (ref 6.5–8.1)

## 2018-06-16 LAB — LACTATE DEHYDROGENASE: LDH: 224 U/L — ABNORMAL HIGH (ref 98–192)

## 2018-06-20 ENCOUNTER — Encounter: Payer: Self-pay | Admitting: Gastroenterology

## 2018-06-20 ENCOUNTER — Telehealth (HOSPITAL_COMMUNITY): Payer: Self-pay

## 2018-06-20 ENCOUNTER — Telehealth (INDEPENDENT_AMBULATORY_CARE_PROVIDER_SITE_OTHER): Payer: BLUE CROSS/BLUE SHIELD | Admitting: Gastroenterology

## 2018-06-20 ENCOUNTER — Encounter (HOSPITAL_COMMUNITY): Payer: Self-pay

## 2018-06-20 ENCOUNTER — Telehealth (HOSPITAL_COMMUNITY): Payer: Self-pay | Admitting: *Deleted

## 2018-06-20 ENCOUNTER — Other Ambulatory Visit: Payer: Self-pay

## 2018-06-20 ENCOUNTER — Other Ambulatory Visit: Payer: Self-pay | Admitting: Gastroenterology

## 2018-06-20 VITALS — Ht 68.0 in

## 2018-06-20 DIAGNOSIS — R12 Heartburn: Secondary | ICD-10-CM

## 2018-06-20 DIAGNOSIS — Z9889 Other specified postprocedural states: Secondary | ICD-10-CM | POA: Diagnosis not present

## 2018-06-20 DIAGNOSIS — Z87891 Personal history of nicotine dependence: Secondary | ICD-10-CM

## 2018-06-20 DIAGNOSIS — I2510 Atherosclerotic heart disease of native coronary artery without angina pectoris: Secondary | ICD-10-CM

## 2018-06-20 DIAGNOSIS — K219 Gastro-esophageal reflux disease without esophagitis: Secondary | ICD-10-CM

## 2018-06-20 DIAGNOSIS — Z951 Presence of aortocoronary bypass graft: Secondary | ICD-10-CM

## 2018-06-20 DIAGNOSIS — D45 Polycythemia vera: Secondary | ICD-10-CM

## 2018-06-20 MED ORDER — PANTOPRAZOLE SODIUM 40 MG PO TBEC: 40.0000 mg | DELAYED_RELEASE_TABLET | Freq: Two times a day (BID) | ORAL | 0 refills | Status: DC

## 2018-06-20 MED ORDER — PANTOPRAZOLE SODIUM 40 MG PO TBEC: 40.0000 mg | DELAYED_RELEASE_TABLET | Freq: Every day | ORAL | 3 refills | Status: DC

## 2018-06-20 NOTE — Progress Notes (Signed)
Chief Complaint: Coffee-ground regurgitation, reflux   Referring Provider:     Tish Men, MD   HPI:    Due to current restrictions/limitations of in-office visits due to the COVID-19 pandemic, this scheduled clinical appointment was converted to a telehealth virtual consultation using D[employer].  -The patient did consent to this virtual visit and is aware of possible charges through their insurance for this visit.  -Names of all parties present: Justin [name] (patient), Gerrit Heck, DO, [facility] (physician) -Patient location: Home -Physician location: Office  Justin [name] is a 63 y.o. male with history of polycythemia vera, thrombocytosis, CAD s/p four-vessel CABG 12/2017, moderate to severe left ear s/p aortic valve replacement and left atrial clip 12/2017 referred to the Gastroenterology Clinic for evaluation of coffee-ground secretions.  States that he has intermittent "dark film" that "leaks from the mouth "and staining the pillowcase/beard.  This has occurred intermittently since his surgery in 12/2017.  No forceful emesis.  No nausea.  No melena or hematochezia.  Denies abdominal pain or changes in bowel habits.  Otherwise tolerating p.o. intake without issue.  He states that he has a longstanding history of intermittent reflux symptoms, that he has treated with OTC antacids prn.  Has had increasing reflux symptoms, described as mainly nocturnal symptoms of regurgitation, belching. + Hoarseness.  Denies dysphagia or odynophagia.  Has not trialed any PPI or H2 RA in the past.  No prior EGD.  Historically symptoms worse with eating close to bedtime or dietary indiscretions.    Taking ASA/Plavix and Eliquis as prescribed for CAD/postop.  Quit smoking 6 months ago as well.   Last colonoscopy was approx 2 years ago by Dr. Penelope Coop at [facility], and he reports n/f benign polyps.  CBC last week with stable H/H 11.5/35.6 with MCV 107 and RDW 20.2.  MCV uptrended since 01/2018  (high 80s/low 90s).  Follows with Dr. Maylon Peppers in Hematology.  Past medical history, past surgical history, social history, family history, medications, and allergies reviewed in the chart and with patient.    Past Medical History:  Diagnosis Date  . Acute meniscal tear of knee LEFT  . Aortic stenosis 12/01/2017   NONRHEUMATIC, AORTIC VALVE CALCIFICATIONS, MILD TO MODERATE REGURG, MILD TO MODERATE CALCIFIED ANNULUS per ECHO 10/25/17 @ [facility]  . Arthritis   . Atrial fibrillation ([place]) 11/12/2017   AT O/V WITH PCP  . DM (diabetes mellitus) ([place])   . Heart murmur MILD-- ASYMPTOMATIC  . Hyperlipidemia   . Hypertension   . Left knee pain   . PAD (peripheral artery disease) ([facility])    left leg claudication     Past Surgical History:  Procedure Laterality Date  . AORTIC VALVE REPLACEMENT N/A 01/03/2018   Procedure: AORTIC VALVE REPLACEMENT (AVR) using 56mm Magna Ease Bioprosthesis Aortic Valve;  Surgeon: Ivin Poot, MD;  Location: [facility];  Service: Open Heart Surgery;  Laterality: N/A;  . APPENDECTOMY  1998  . CATARACT EXTRACTION W/ INTRAOCULAR LENS  IMPLANT, BILATERAL  1998/  2000  . CERVICAL FUSION  1985   C4 - 5  . CORONARY ARTERY BYPASS GRAFT N/A 01/03/2018   Procedure: CORONARY ARTERY BYPASS GRAFTING (CABG) x 4 (LIMA to LAD, SVG to DIAGONAL, SVG to RAMUS INTERMEDIATE, and SVG to PDA), USING LEFT INFTERNAL MAMMARY ARTERY AND;  Surgeon: Prescott Gum, Collier Salina, MD;  Location: [facility];  Service: Open Heart Surgery;  Laterality: N/A;  . ENDARTERECTOMY  Right 01/03/2018   Procedure: ENDARTERECTOMY CAROTID;  Surgeon: Marty Heck, MD;  Location: [facility];  Service: Vascular;  Laterality: Right;  . KNEE ARTHROSCOPY W/ MENISCECTOMY  1991   LEFT KNEE  . NASAL SINUS SURGERY  1982  . RIGHT/LEFT HEART CATH AND CORONARY ANGIOGRAPHY N/A 12/24/2017   Procedure: RIGHT/LEFT HEART CATH AND CORONARY ANGIOGRAPHY;  Surgeon: Jolaine Artist, MD;  Location: [facility];  Service:  Cardiovascular;  Laterality: N/A;  . ROTATOR CUFF REPAIR  09-04-2005   LEFT SHOULDER  . TEE WITHOUT CARDIOVERSION N/A 12/24/2017   Procedure: TRANSESOPHAGEAL ECHOCARDIOGRAM (TEE);  Surgeon: Jolaine Artist, MD;  Location: [facility];  Service: Cardiovascular;  Laterality: N/A;  . TEE WITHOUT CARDIOVERSION N/A 01/03/2018   Procedure: TRANSESOPHAGEAL ECHOCARDIOGRAM (TEE);  Surgeon: Prescott Gum, Collier Salina, MD;  Location: [place];  Service: Open Heart Surgery;  Laterality: N/A;  . TONSILLECTOMY  AGE 4   Family History  Adopted: Yes  Problem Relation Age of Onset  . Bladder Cancer Father        adopted father   Social History   Tobacco Use  . Smoking status: Former Smoker    Packs/day: 0.25    Years: 23.00    Pack years: 5.75    Types: Cigars    Last attempt to quit: 01/02/2018    Years since quitting: 0.4  . Smokeless tobacco: Never Used  . Tobacco comment: quit cigs in 2011 and smokes cigars daily- from 3-10 cigars-09/27/13  Substance Use Topics  . Alcohol use: Not Currently    Alcohol/week: 0.0 standard drinks    Comment: not now  . Drug use: No   Current Outpatient Medications  Medication Sig Dispense Refill  . aspirin 81 MG tablet Take 81 mg by mouth daily.     . Cholecalciferol 125 MCG (5000 UT) capsule Take 1 capsule by mouth daily.     . cyclobenzaprine (FLEXERIL) 10 MG tablet Take 10 mg by mouth 3 (three) times daily.     . fluticasone (FLONASE) 50 MCG/ACT nasal spray Place 2 sprays into both nostrils daily.    . furosemide (LASIX) 20 MG tablet Take 20 mg by mouth as needed.     . hydroxyurea (HYDREA) 500 MG capsule Take 2 capsules in the morning and one capsule at night. May take with food to minimize GI side effects. 90 capsule 1  . loratadine (CLARITIN) 10 MG tablet Take 10 mg by mouth daily.    . metFORMIN (GLUCOPHAGE-XR) 500 MG 24 hr tablet Take 500 mg by mouth daily before supper.    . metoprolol succinate (TOPROL XL) 25 MG 24 hr tablet Take 1 tablet (25 mg total) by  mouth daily. 90 tablet 3  . nefazodone (SERZONE) 100 MG tablet Take 100 mg by mouth 2 (two) times daily.    Marland Kitchen POTASSIUM CHLORIDE PO Take 1 tablet by mouth as needed.    . pregabalin (LYRICA) 50 MG capsule Take 50 mg by mouth 3 (three) times daily.    . rosuvastatin (CRESTOR) 20 MG tablet Take 1 tablet (20 mg total) by mouth daily. 90 tablet 3  . zolpidem (AMBIEN) 10 MG tablet Take 10 mg by mouth at bedtime as needed.    Marland Kitchen apixaban (ELIQUIS) 5 MG TABS tablet Take 1 tablet (5 mg total) by mouth 2 (two) times daily.     No current facility-administered medications for this visit.    Allergies  Allergen Reactions  . Codeine Swelling    SWELLING REACTION  UNSPECIFIED  Tolerates hydrocodone and oxycodone      Review of Systems: All systems reviewed and negative except where noted in HPI.     Physical Exam:    Complete physical exam not completed due to the nature of this telehealth communication.   Gen: Awake, alert, and oriented, and well communicative. HEENT: EOMI, non-icteric sclera, NCAT, MMM Pulm: No labored breathing, speaking in full sentences without conversational dyspnea Derm: No apparent lesions or bruising in visible field   ASSESSMENT AND PLAN;   1) GERD 2) Regurgitation 3) Coffee-ground secretions  63 year old male with probable prior history of reflux, treated with OTC antacids, now with increasing reflux type symptoms (regurgitation, belching, hoarseness).  The dark material that effortlessly regurgitates from his mouth at nighttime could either be just refluxate/regurgitant or possibly from increased ciliary clearance now that he has stopped smoking 6 months ago.  -Start Protonix 40 mg p.o. twice daily for diagnostic/therapeutic intent.  If reflux symptoms improved, plan to decrease to lowest effective dose -Given recent bypass surgery, holding off on elective endoscopy at this time in the absence of active bleeding or "red flag "symptoms - If reflux symptoms  controlled with high-dose PPI, but ongoing dark staining, more likely to think this is pulmonary clearance  4) CAD s/p four-vessel CABG 12/2017 5) History of carotid endarterectomy -Antiplatelet/anticoagulation as prescribed -Starting high-dose PPI for further gastric prophylaxis as above.  Plan to titrate to lowest effective dose, but would advise continued PPI for ongoing prophylaxis  6) Polycythemia vera/Thrombocytosis: -Follows with Hematology  7) History of tobacco use: -Applauded his success in quitting and continued abstinence from smoking  RTC in 3 months or sooner as needed   Lavena Bullion, DO, FACG  06/20/2018, 10:14 AM   Tish Men, MD

## 2018-06-20 NOTE — Patient Instructions (Signed)
If you are age 64 or older, your body mass index should be between 23-30. Your Body mass index is 30.12 kg/m. If this is out of the aforementioned range listed, please consider follow up with your Primary Care Provider.  If you are age 37 or younger, your body mass index should be between 19-25. Your Body mass index is 30.12 kg/m. If this is out of the aformentioned range listed, please consider follow up with your Primary Care Provider.   To help prevent the possible spread of infection to our patients, communities, and staff; we will be implementing the following measures:  As of now we are not allowing any visitors/family members to accompany you to any upcoming appointments with [facility]. If you have any concerns about this please contact our office to discuss prior to the appointment.   We have sent the following medications to your pharmacy for you to pick up at your convenience: Protonix 40mg  by mouth twice daily for 4 weeks then decrease to once daily.  Please call our office at [phone] to set up your 3 month follow up visit.  It was a pleasure to see you today!  Vito Cirigliano, D.O.

## 2018-06-20 NOTE — Telephone Encounter (Signed)
° °        Confirm Consent - In the setting of the current Covid19 crisis, you are scheduled for a phone visit with your Cardiac or Pulmonary team member.  Just as we do with many in-gym visits, in order for you to participate in this visit, we must obtain consent.  If you'd like, I can send this to your mychart (if signed up) or email for you to review.  Otherwise, I can obtain your verbal consent now.  By agreeing to a telephone visit, we'd like you to understand that the technology does not allow for your Cardiac or Pulmonary Rehab team member to perform a physical assessment, and thus may limit their ability to fully assess your ability to perform exercise programs. If your provider identifies any concerns that need to be evaluated in person, we will make arrangements to do so.  Finally, though the technology is pretty good, we cannot assure that it will always work on either your or our end and we cannot ensure that we have a secure connection.  Cardiac and Pulmonary Rehab Telehealth visits and At Home cardiac and pulmonary rehab are provided at no cost to you.               Are you willing to proceed?" STAFF: Did the patient verbally acknowledge consent to telehealth visit? Document YES/NO here: Yes      Jessica C.   Cardiac and Pulmonary Rehab Staff   D6/01/2018 T11:39AM

## 2018-06-22 ENCOUNTER — Encounter (HOSPITAL_COMMUNITY): Payer: Self-pay | Admitting: *Deleted

## 2018-06-22 ENCOUNTER — Encounter (HOSPITAL_COMMUNITY)
Admission: RE | Admit: 2018-06-22 | Discharge: 2018-06-22 | Disposition: A | Discharge status: Home / Self Care | Payer: BC Managed Care – PPO | Source: Ambulatory Visit | Attending: Cardiology | Admitting: Cardiology

## 2018-06-22 DIAGNOSIS — Z951 Presence of aortocoronary bypass graft: Secondary | ICD-10-CM | POA: Insufficient documentation

## 2018-06-22 DIAGNOSIS — Z952 Presence of prosthetic heart valve: Secondary | ICD-10-CM | POA: Insufficient documentation

## 2018-06-22 NOTE — Progress Notes (Signed)
Called and spoke to pt regarding Virtual Cardiac Rehab.  Pt  was able to download the [employer] app on their smart device with no issues. Pt set up their account and received the following welcome message -"Welcome to the [facility] program. We hope that you will find the exercise program beneficial in your recovery process. Our staff is available to assist with in questions/concerns about your exercise routine. Best wishes". Brief orientation provided to with the advisement to watch the "Intro to Rehab" series located under the Resource tab. Pt verbalized understanding. Will continue to follow and monitor pt progress with feedback as needed. 

## 2018-06-23 ENCOUNTER — Telehealth: Payer: Self-pay | Admitting: Gastroenterology

## 2018-06-23 DIAGNOSIS — K219 Gastro-esophageal reflux disease without esophagitis: Secondary | ICD-10-CM

## 2018-06-28 ENCOUNTER — Other Ambulatory Visit: Payer: Self-pay

## 2018-06-28 DIAGNOSIS — D45 Polycythemia vera: Secondary | ICD-10-CM

## 2018-06-28 MED ORDER — HYDROXYUREA 500 MG PO CAPS: ORAL_CAPSULE | ORAL | 1 refills | Status: DC

## 2018-06-29 ENCOUNTER — Telehealth: Payer: BLUE CROSS/BLUE SHIELD | Admitting: Cardiothoracic Surgery

## 2018-06-30 DIAGNOSIS — M9905 Segmental and somatic dysfunction of pelvic region: Secondary | ICD-10-CM | POA: Diagnosis not present

## 2018-06-30 DIAGNOSIS — M5137 Other intervertebral disc degeneration, lumbosacral region: Secondary | ICD-10-CM | POA: Diagnosis not present

## 2018-06-30 DIAGNOSIS — M9903 Segmental and somatic dysfunction of lumbar region: Secondary | ICD-10-CM | POA: Diagnosis not present

## 2018-06-30 DIAGNOSIS — M7061 Trochanteric bursitis, right hip: Secondary | ICD-10-CM | POA: Diagnosis not present

## 2018-06-30 MED ORDER — OMEPRAZOLE 40 MG PO CPDR: 40.0000 mg | DELAYED_RELEASE_CAPSULE | Freq: Two times a day (BID) | ORAL | 3 refills | Status: DC

## 2018-06-30 NOTE — Telephone Encounter (Signed)
Please give Rx for Prilosec 40 mg PO BID #[id number]. Thanks.

## 2018-06-30 NOTE — Telephone Encounter (Signed)
Medication has been sent to patients pharmacy.

## 2018-07-01 ENCOUNTER — Other Ambulatory Visit: Payer: Self-pay

## 2018-07-01 ENCOUNTER — Telehealth: Payer: BLUE CROSS/BLUE SHIELD | Admitting: Cardiothoracic Surgery

## 2018-07-01 MED ORDER — LANSOPRAZOLE 30 MG PO CPDR: 30.0000 mg | DELAYED_RELEASE_CAPSULE | Freq: Every day | ORAL | 3 refills | Status: DC

## 2018-07-01 MED ORDER — AMOXICILLIN 500 MG PO CAPS: 500.0000 mg | ORAL_CAPSULE | Freq: Once | ORAL | 12 refills | Status: DC | PRN

## 2018-07-01 NOTE — Telephone Encounter (Signed)
[address]       [place] [address]             [phone]     CARDIOTHORACIC SURGERY TELEPHONE VIRTUAL OFFICE NOTE  Referring Provider is No ref. provider found Primary Cardiologist is Buford Dresser, MD PCP is [name], MD   HPI:  I spoke with Justin [name] (DOB February 16, 1955 ) via telephone on 07/01/2018 at 10:05 AM and verified that I was speaking with the correct person using more than one form of identification.  We discussed the reason(s) for conducting our visit virtually instead of in-person.  The patient expressed understanding the circumstances and agreed to proceed as described.  Patient is now 6 months status post combined AVR CABG for aortic stenosis and coronary disease.  He has chronic atrial fibrillation on Eliquis.  He has not been able to participate in cardiac rehab because the programs are close due to the [place] virus regulations.  He tries to walk 15 to 20 minutes most days.  He denies any symptoms of angina or CHF.  Surgical incisions are well-healed.  His cardiologist is Dr. Al Pimple. he has significant hematologic problems with immunosuppressive sequela. 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  No bleeding complications from his Eliquis. Some symptoms of reflux for which he will take Prevacid  Current Outpatient Medications  Medication Sig Dispense Refill  . lansoprazole (PREVACID) 30 MG capsule Please specify directions, refills and quantity 30 capsule 0  . apixaban (ELIQUIS) 5 MG TABS tablet Take 1 tablet (5 mg total) by mouth 2 (two) times daily.    Marland Kitchen aspirin 81 MG tablet Take 81 mg by mouth daily.     . Cholecalciferol 125 MCG (5000 UT) capsule Take 1 capsule by mouth daily.     . cyclobenzaprine  (FLEXERIL) 10 MG tablet Take 10 mg by mouth 3 (three) times daily.     . fluticasone (FLONASE) 50 MCG/ACT nasal spray Place 2 sprays into both nostrils daily.    . furosemide (LASIX) 20 MG tablet Take 20 mg by mouth as needed.     . hydroxyurea (HYDREA) 500 MG capsule Take 2 capsules in the morning and one capsule at night. May take with food to minimize GI side effects. 90 capsule 1  . loratadine (CLARITIN) 10 MG tablet Take 10 mg by mouth daily.    . metFORMIN (GLUCOPHAGE-XR) 500 MG 24 hr tablet Take 500 mg by mouth daily before supper.    . metoprolol succinate (TOPROL XL) 25 MG 24 hr tablet Take 1 tablet (25 mg total) by mouth daily. 90 tablet 3  . nefazodone (SERZONE) 100 MG tablet Take 100 mg by mouth 2 (two) times daily.    Marland Kitchen omeprazole (PRILOSEC) 40 MG capsule Take 1 capsule (40 mg total) by mouth 2 (two) times a day. 180 capsule 3  . pantoprazole (PROTONIX) 40 MG tablet Take 1 tablet (40 mg total) by mouth daily. 90 tablet 3  . POTASSIUM CHLORIDE PO Take 1 tablet by mouth as needed.    . pregabalin (LYRICA) 50 MG capsule Take 50 mg by mouth 3 (three) times daily.    . rosuvastatin (CRESTOR) 20 MG tablet Take 1 tablet (20 mg total) by mouth daily. 90 tablet 3  . zolpidem (AMBIEN)  10 MG tablet Take 10 mg by mouth at bedtime as needed.     No current facility-administered medications for this visit.      Diagnostic Tests: States last blood pressure was measured at 124/80      Impression:  Excellent recovery after combined AVR CABG No surgical problems Antibiotic prophylaxis prior to dental visits discussed in detail with patient and prescription for amoxicillin taken before dental visits placed at patient's pharmacy Plan: Patient will be followed by his primary care and cardiologist and return here as needed.     07/01/2018 10:05 AM

## 2018-07-01 NOTE — Addendum Note (Signed)
Addended by: Elnita Maxwell R on: 07/01/2018 10:35 AM   Modules accepted: Orders

## 2018-07-07 ENCOUNTER — Telehealth (HOSPITAL_COMMUNITY): Payer: Self-pay

## 2018-07-07 DIAGNOSIS — M5137 Other intervertebral disc degeneration, lumbosacral region: Secondary | ICD-10-CM | POA: Diagnosis not present

## 2018-07-07 DIAGNOSIS — M7061 Trochanteric bursitis, right hip: Secondary | ICD-10-CM | POA: Diagnosis not present

## 2018-07-07 DIAGNOSIS — M9903 Segmental and somatic dysfunction of lumbar region: Secondary | ICD-10-CM | POA: Diagnosis not present

## 2018-07-07 DIAGNOSIS — M9905 Segmental and somatic dysfunction of pelvic region: Secondary | ICD-10-CM | POA: Diagnosis not present

## 2018-07-08 DIAGNOSIS — E119 Type 2 diabetes mellitus without complications: Secondary | ICD-10-CM | POA: Diagnosis not present

## 2018-07-08 DIAGNOSIS — I10 Essential (primary) hypertension: Secondary | ICD-10-CM | POA: Diagnosis not present

## 2018-07-08 DIAGNOSIS — K219 Gastro-esophageal reflux disease without esophagitis: Secondary | ICD-10-CM | POA: Diagnosis not present

## 2018-07-08 DIAGNOSIS — E782 Mixed hyperlipidemia: Secondary | ICD-10-CM | POA: Diagnosis not present

## 2018-07-13 ENCOUNTER — Telehealth (HOSPITAL_COMMUNITY): Payer: Self-pay | Admitting: *Deleted

## 2018-07-14 DIAGNOSIS — M9903 Segmental and somatic dysfunction of lumbar region: Secondary | ICD-10-CM | POA: Diagnosis not present

## 2018-07-14 DIAGNOSIS — M5137 Other intervertebral disc degeneration, lumbosacral region: Secondary | ICD-10-CM | POA: Diagnosis not present

## 2018-07-14 DIAGNOSIS — M9905 Segmental and somatic dysfunction of pelvic region: Secondary | ICD-10-CM | POA: Diagnosis not present

## 2018-07-14 DIAGNOSIS — M7061 Trochanteric bursitis, right hip: Secondary | ICD-10-CM | POA: Diagnosis not present

## 2018-07-18 ENCOUNTER — Telehealth (HOSPITAL_COMMUNITY): Payer: Self-pay | Admitting: *Deleted

## 2018-07-18 NOTE — Telephone Encounter (Signed)
-----   Message from Buford Dresser, MD sent at 07/18/2018  3:42 PM EDT ----- Regarding: RE: Resuming phase 2 cardiac rehab Thank you for reaching out. I think if he feels up to it he would be a great candidate to restart in person cardiac rehab. Thanks! Buford Dresser ----- Message ----- From: Magda Kiel, RN Sent: 07/18/2018   9:30 AM EDT To: Buford Dresser, MD Subject: Resuming phase 2 cardiac rehab                 Dr. Harrell Gave  We are preparing for current patients to phase in reentry to in facility cardiac rehab. A great deal of planning with advisement from our Medical Director - Dr. Radford Pax, CV Service line leadership, Infection Disease Control, Facilities, security,recommendations from American Association of Cardiac and Pulmonary Rehab (AACVPR) with the goal for optimal patient safety. Patients will have strict guidelines and criteria they must adhere and follow.  Pt will have to complete screenings prior to their scheduled appointment and  again prior to entry into gym area.  Your pt, Justin [name]    expressed great interest in returning to in facility cardiac rehab. Pt has a Covid risk score  of a 9 .  Based upon today's assessment, do you feel this pt is appropriate to resume in facility cardiac rehab?  Any additional restrictions you feel are appropriate for this pt?  Justin [name] signed up for virtual cardiac rehab but did not use it. Justin [name] had completed 10 exercise sessions prior to the department closure due to the [place] 19 pandemic  Thank you and we appreciate your valued input.  Sincerely,  Barnet Pall RN

## 2018-07-19 ENCOUNTER — Telehealth (HOSPITAL_COMMUNITY): Payer: Self-pay

## 2018-07-19 ENCOUNTER — Encounter: Payer: Self-pay | Admitting: Hematology

## 2018-07-19 ENCOUNTER — Telehealth: Payer: Self-pay | Admitting: Hematology

## 2018-07-19 ENCOUNTER — Other Ambulatory Visit: Payer: Self-pay

## 2018-07-19 ENCOUNTER — Inpatient Hospital Stay (HOSPITAL_BASED_OUTPATIENT_CLINIC_OR_DEPARTMENT_OTHER): Payer: BC Managed Care – PPO | Admitting: Hematology

## 2018-07-19 ENCOUNTER — Inpatient Hospital Stay: Payer: BC Managed Care – PPO | Attending: Hematology and Oncology

## 2018-07-19 VITALS — BP 112/51 | HR 94 | Temp 97.1°F | Resp 19 | Ht 68.0 in | Wt 201.0 lb

## 2018-07-19 DIAGNOSIS — R1110 Vomiting, unspecified: Secondary | ICD-10-CM | POA: Insufficient documentation

## 2018-07-19 DIAGNOSIS — Z7982 Long term (current) use of aspirin: Secondary | ICD-10-CM

## 2018-07-19 DIAGNOSIS — K219 Gastro-esophageal reflux disease without esophagitis: Secondary | ICD-10-CM | POA: Diagnosis not present

## 2018-07-19 DIAGNOSIS — Z7901 Long term (current) use of anticoagulants: Secondary | ICD-10-CM | POA: Diagnosis not present

## 2018-07-19 DIAGNOSIS — I2510 Atherosclerotic heart disease of native coronary artery without angina pectoris: Secondary | ICD-10-CM

## 2018-07-19 DIAGNOSIS — D649 Anemia, unspecified: Secondary | ICD-10-CM | POA: Insufficient documentation

## 2018-07-19 DIAGNOSIS — Z7984 Long term (current) use of oral hypoglycemic drugs: Secondary | ICD-10-CM

## 2018-07-19 DIAGNOSIS — L570 Actinic keratosis: Secondary | ICD-10-CM | POA: Diagnosis not present

## 2018-07-19 DIAGNOSIS — Z79899 Other long term (current) drug therapy: Secondary | ICD-10-CM

## 2018-07-19 DIAGNOSIS — K920 Hematemesis: Secondary | ICD-10-CM

## 2018-07-19 DIAGNOSIS — D45 Polycythemia vera: Secondary | ICD-10-CM

## 2018-07-19 DIAGNOSIS — D473 Essential (hemorrhagic) thrombocythemia: Secondary | ICD-10-CM

## 2018-07-19 DIAGNOSIS — D75839 Thrombocytosis, unspecified: Secondary | ICD-10-CM

## 2018-07-19 LAB — CBC WITH DIFFERENTIAL (CANCER CENTER ONLY)
Abs Immature Granulocytes: 0.22 10*3/uL — ABNORMAL HIGH (ref 0.00–0.07)
Basophils Absolute: 0.1 10*3/uL (ref 0.0–0.1)
Basophils Relative: 1 %
Eosinophils Absolute: 0 10*3/uL (ref 0.0–0.5)
Eosinophils Relative: 0 %
HCT: 33.9 % — ABNORMAL LOW (ref 39.0–52.0)
Hemoglobin: 11.2 g/dL — ABNORMAL LOW (ref 13.0–17.0)
Immature Granulocytes: 3 %
Lymphocytes Relative: 20 %
Lymphs Abs: 1.8 10*3/uL (ref 0.7–4.0)
MCH: 36 pg — ABNORMAL HIGH (ref 26.0–34.0)
MCHC: 33 g/dL (ref 30.0–36.0)
MCV: 109 fL — ABNORMAL HIGH (ref 80.0–100.0)
Monocytes Absolute: 0.9 10*3/uL (ref 0.1–1.0)
Monocytes Relative: 10 %
Neutro Abs: 5.9 10*3/uL (ref 1.7–7.7)
Neutrophils Relative %: 66 %
Platelet Count: 249 10*3/uL (ref 150–400)
RBC: 3.11 MIL/uL — ABNORMAL LOW (ref 4.22–5.81)
RDW: 18.2 % — ABNORMAL HIGH (ref 11.5–15.5)
WBC Count: 8.9 10*3/uL (ref 4.0–10.5)
nRBC: 0.3 % — ABNORMAL HIGH (ref 0.0–0.2)

## 2018-07-19 LAB — CMP (CANCER CENTER ONLY)
ALT: 7 U/L (ref 0–44)
AST: 14 U/L — ABNORMAL LOW (ref 15–41)
Albumin: 3.9 g/dL (ref 3.5–5.0)
Alkaline Phosphatase: 79 U/L (ref 38–126)
Anion gap: 9 (ref 5–15)
BUN: 12 mg/dL (ref 8–23)
CO2: 27 mmol/L (ref 22–32)
Calcium: 9.8 mg/dL (ref 8.9–10.3)
Chloride: 99 mmol/L (ref 98–111)
Creatinine: 0.8 mg/dL (ref 0.61–1.24)
GFR, Est AFR Am: >60 mL/min (ref >60)
GFR, Estimated: >60 mL/min (ref >60)
Glucose, Bld: 109 mg/dL — ABNORMAL HIGH (ref 70–99)
Potassium: 4.2 mmol/L (ref 3.5–5.1)
Sodium: 135 mmol/L (ref 135–145)
Total Bilirubin: 0.5 mg/dL (ref 0.3–1.2)
Total Protein: 7.6 g/dL (ref 6.5–8.1)

## 2018-07-19 LAB — LACTATE DEHYDROGENASE: LDH: 234 U/L — ABNORMAL HIGH (ref 98–192)

## 2018-07-19 NOTE — Telephone Encounter (Signed)
Called and spoke with pt in regards to CR, pt will come in for exercise 07/25/2018 @ 10AM.

## 2018-07-19 NOTE — Telephone Encounter (Signed)
Called and advised patient of upcoming Dermatology appt as well as follow up appts w/ Dr Maylon Peppers per 6/30 los

## 2018-07-19 NOTE — Progress Notes (Signed)
[facility] OFFICE PROGRESS NOTE  Patient Care Team: [name], MD as PCP - General (Family Medicine) Buford Dresser, MD as PCP - Cardiology (Cardiology)  HEME/ONC OVERVIEW: 1. Polycythemia vera, JAK2 V617 mutation+; high risk (age >16) -Previous patient of Justin [name]  -11/2017: Hgb ~17; WBC ~30k w/ nl diff, plts ~500k; NGS showed JAK2 V617F mutation (83% allele frequency), CAL-R mutation (4% allele frequency); BCR/ALB negative; no bone marrow biopsy done at diagnosis  -11/2017 - present: Hydrea, see dosing below; phlebotomy x 1 in 12/2017  TREATMENT REGIMEN:  12/03/2017 - present: Hydrea, currently 569m TID  PRN phlebotomy, most recent on 12/23/2017  PERTINENT NON-HEM/ONC PROBLEMS: 1. CAD s/p CABG x 4 in 12/2017 2. Moderate to severe aortic stenosis s/p aortic valve replacement and left atrial clip in 12/2017   ASSESSMENT & PLAN:   Polycythemia vera, JAK2+; high risk  -Previous patient of Justin [name] 33, stable; no indication for phlebotomy -Goal Hct < 45% -Currently on Hydrea 10077mqAM and 50026mhs with adequate control; continue the current dosing regimen -On ASA 66m83mily   Normocytic anemia -Secondary to Hydrea -Hgb 11.2 today, stable -Patient denies any symptoms of bleeding -We will monitor it for now  Coffee-ground emesis -Patient was recently evaluated by Justin [name], who felt that this was caused by acid reflux -Since starting PPI, the brown emesis is improving; no hematemesis, hematochezia or melena -He has had some issues with the cost of PPI and I encouraged patient to talk with his pharmacist at his local pharmacy to see which generic brand of PPI is the most affordable -Continue follow-up with gastroenterology as scheduled  Actinic keratosis -Patient was referred to dermatology at last visit, but has not yet been called with an appt  -I counseled the patient on importance of minimizing prolonged sun exposure, as  well as wearing sunscreen and protective clothing when outdoors -We will try to reach out to dermatology clinic to expedite his appointment  No orders of the defined types were placed in this encounter.  All questions were answered. The patient knows to call the clinic with any problems, questions or concerns. No barriers to learning was detected.  Return in 2 months for labs and clinic follow-up.  Justin [name] 07/19/2018 11:53 AM  CHIEF COMPLAINT: "I am doing fine"  INTERVAL HISTORY: [name] to clinic for follow-up of PV on Hydrea.  Patient reports that he has been tolerating Hydrea 1000mg48m/500mg 54mwell without any significant side effects.  He was recently seen by Dr. CiriglBryan [facility] for dark brown emesis, and was thought to have acid reflux.  He was prescribed Protonix, which was very expensive, and his thoracic surgeon changed the prescription to Prilosec, but unfortunately was still costing over $100 every 3 months.  He reports that since starting PPI, his brown emesis has improved.  He denies any abdominal pain, nausea, or hematemesis.  He has not yet received an appointment from dermatology since last clinic visit.  He is compliant with sun protective clothing as well as sunscreen when he goes outdoors.  He denies any other complaint today.  REVIEW OF SYSTEMS:   Constitutional: ( - ) fevers, ( - )  chills , ( - ) night sweats Eyes: ( - ) blurriness of vision, ( - ) double vision, ( - ) watery eyes Ears, nose, mouth, throat, and face: ( - ) mucositis, ( - ) sore throat Respiratory: ( - ) cough, ( - )  dyspnea, ( - ) wheezes Cardiovascular: ( - ) palpitation, ( - ) chest discomfort, ( - ) lower extremity swelling Gastrointestinal:  ( - ) nausea, ( + ) heartburn, ( - ) change in bowel habits Skin: ( - ) abnormal skin rashes Lymphatics: ( - ) new lymphadenopathy, ( - ) easy bruising Neurological: ( - ) numbness, ( - ) tingling, ( - ) new  weaknesses Behavioral/Psych: ( - ) mood change, ( - ) new changes  All other systems were reviewed with the patient and are negative.  I have reviewed the past medical history, past surgical history, social history and family history with the patient and they are unchanged from previous note.  ALLERGIES:  is allergic to codeine.  MEDICATIONS:  Current Outpatient Medications  Medication Sig Dispense Refill  . apixaban (ELIQUIS) 5 MG TABS tablet Take 1 tablet (5 mg total) by mouth 2 (two) times daily.    Marland Kitchen aspirin 81 MG tablet Take 81 mg by mouth daily.     . Cholecalciferol 125 MCG (5000 UT) capsule Take 1 capsule by mouth daily.     . cyclobenzaprine (FLEXERIL) 10 MG tablet Take 10 mg by mouth 3 (three) times daily.     . fluticasone (FLONASE) 50 MCG/ACT nasal spray Place 2 sprays into both nostrils daily.    . furosemide (LASIX) 20 MG tablet Take 20 mg by mouth as needed.     . hydroxyurea (HYDREA) 500 MG capsule Take 2 capsules in the morning and one capsule at night. May take with food to minimize GI side effects. 90 capsule 1  . loratadine (CLARITIN) 10 MG tablet Take 10 mg by mouth daily.    . metFORMIN (GLUCOPHAGE-XR) 500 MG 24 hr tablet Take 500 mg by mouth daily before supper.    . metoprolol succinate (TOPROL XL) 25 MG 24 hr tablet Take 1 tablet (25 mg total) by mouth daily. 90 tablet 3  . nefazodone (SERZONE) 100 MG tablet Take 100 mg by mouth 2 (two) times daily.    Marland Kitchen omeprazole (PRILOSEC) 40 MG capsule Take 1 capsule (40 mg total) by mouth 2 (two) times a day. 180 capsule 3  . POTASSIUM CHLORIDE PO Take 1 tablet by mouth as needed.    . pregabalin (LYRICA) 50 MG capsule Take 50 mg by mouth 3 (three) times daily.    . rosuvastatin (CRESTOR) 20 MG tablet Take 1 tablet (20 mg total) by mouth daily. 90 tablet 3  . zolpidem (AMBIEN) 10 MG tablet Take 10 mg by mouth at bedtime as needed.    Marland Kitchen amoxicillin (AMOXIL) 500 MG capsule Take 1 capsule (500 mg total) by mouth once as needed  for up to 4 doses. Take four 500 mg capsules by mouth 1 hour before dental visits (Patient not taking: Reported on 07/19/2018) 4 capsule 12   No current facility-administered medications for this visit.     PHYSICAL EXAMINATION: ECOG PERFORMANCE STATUS: 1 - Symptomatic but completely ambulatory  Today's Vitals   07/19/18 1144  BP: (!) 112/51  Pulse: 94  Resp: 19  Temp: (!) 97.1 F (36.2 C)  TempSrc: Oral  SpO2: 99%  Weight: 201 lb (91.2 kg)  Height: 5' 8"  (1.727 m)  PainSc: 0-No pain   Body mass index is 30.56 kg/m.  Filed Weights   07/19/18 1144  Weight: 201 lb (91.2 kg)    GENERAL: alert, no distress and comfortable SKIN: changes in skin over bilateral upper forearms, consistent with actinic keratosis EYES:  conjunctiva are pink and non-injected, sclera clear OROPHARYNX: no exudate, no erythema; lips, buccal mucosa, and tongue normal  NECK: supple, non-tender LUNGS: clear to auscultation with normal breathing effort HEART: regular rate & rhythm and no murmurs and no lower extremity edema ABDOMEN: soft, non-tender, non-distended, normal bowel sounds Musculoskeletal: no cyanosis of digits and no clubbing  PSYCH: alert & oriented x 3, fluent speech NEURO: no focal motor/sensory deficits  LABORATORY DATA:  I have reviewed the data as listed    Component Value Date/Time   NA 135 07/19/2018 1044   K 4.2 07/19/2018 1044   CL 99 07/19/2018 1044   CO2 27 07/19/2018 1044   GLUCOSE 109 (H) 07/19/2018 1044   BUN 12 07/19/2018 1044   CREATININE 0.80 07/19/2018 1044   CALCIUM 9.8 07/19/2018 1044   PROT 7.6 07/19/2018 1044   ALBUMIN 3.9 07/19/2018 1044   AST 14 (L) 07/19/2018 1044   ALT 7 07/19/2018 1044   ALKPHOS 79 07/19/2018 1044   BILITOT 0.5 07/19/2018 1044   GFRNONAA >60 07/19/2018 1044   GFRAA >60 07/19/2018 1044    No results found for: SPEP, UPEP  Lab Results  Component Value Date   WBC 8.9 07/19/2018   NEUTROABS 5.9 07/19/2018   HGB 11.2 (L) 07/19/2018    HCT 33.9 (L) 07/19/2018   MCV 109.0 (H) 07/19/2018   PLT 249 07/19/2018      Chemistry      Component Value Date/Time   NA 135 07/19/2018 1044   K 4.2 07/19/2018 1044   CL 99 07/19/2018 1044   CO2 27 07/19/2018 1044   BUN 12 07/19/2018 1044   CREATININE 0.80 07/19/2018 1044      Component Value Date/Time   CALCIUM 9.8 07/19/2018 1044   ALKPHOS 79 07/19/2018 1044   AST 14 (L) 07/19/2018 1044   ALT 7 07/19/2018 1044   BILITOT 0.5 07/19/2018 1044

## 2018-07-21 DIAGNOSIS — M7061 Trochanteric bursitis, right hip: Secondary | ICD-10-CM | POA: Diagnosis not present

## 2018-07-21 DIAGNOSIS — M9905 Segmental and somatic dysfunction of pelvic region: Secondary | ICD-10-CM | POA: Diagnosis not present

## 2018-07-21 DIAGNOSIS — M5137 Other intervertebral disc degeneration, lumbosacral region: Secondary | ICD-10-CM | POA: Diagnosis not present

## 2018-07-21 DIAGNOSIS — M9903 Segmental and somatic dysfunction of lumbar region: Secondary | ICD-10-CM | POA: Diagnosis not present

## 2018-07-22 ENCOUNTER — Other Ambulatory Visit: Payer: Self-pay | Admitting: Hematology

## 2018-07-22 DIAGNOSIS — D45 Polycythemia vera: Secondary | ICD-10-CM

## 2018-07-25 ENCOUNTER — Encounter (HOSPITAL_COMMUNITY): Payer: BC Managed Care – PPO

## 2018-07-27 ENCOUNTER — Encounter (HOSPITAL_COMMUNITY)
Admission: RE | Admit: 2018-07-27 | Discharge: 2018-07-27 | Disposition: A | Discharge status: Home / Self Care | Payer: BC Managed Care – PPO | Source: Ambulatory Visit | Attending: Cardiology | Admitting: Cardiology

## 2018-07-27 ENCOUNTER — Other Ambulatory Visit: Payer: Self-pay

## 2018-07-27 DIAGNOSIS — Z952 Presence of prosthetic heart valve: Secondary | ICD-10-CM | POA: Diagnosis not present

## 2018-07-27 DIAGNOSIS — Z951 Presence of aortocoronary bypass graft: Secondary | ICD-10-CM | POA: Diagnosis not present

## 2018-07-27 NOTE — Progress Notes (Signed)
Daily Session Note  Patient Details  Name: Justin [name] MRN: [id number] Date of Birth: 1955/03/09 Referring Provider:     CARDIAC REHAB PHASE II ORIENTATION from 03/03/2018 in [facility]  Referring Provider  Dr. Harrell Gave      Encounter Date: 07/27/2018  Check In: Session Check In - 07/27/18 1012      Check-In   Supervising physician immediately available to respond to emergencies  [facility] immediately available    Physician(s)  Dr. Cathlean Sauer    Location  [facility]    Staff Present  Deitra Mayo, BS, ACSM CEP, Exercise Physiologist;Tyara Carol Ada, MS,ACSM CEP, Exercise Physiologist;Azaya Goedde Karle Starch, RN, BSN    Virtual Visit  No    Medication changes reported      No    Fall or balance concerns reported     No    Tobacco Cessation  No Change    Warm-up and Cool-down  Performed on first and last piece of equipment    Resistance Training Performed  No    VAD Patient?  No    PAD/SET Patient?  No      Pain Assessment   Currently in Pain?  No/denies    Pain Score  0-No pain    Multiple Pain Sites  No       Capillary Blood Glucose: No results found for this or any previous visit (from the past 24 hour(s)).    Social History   Tobacco Use  Smoking Status Former Smoker  . Packs/day: 0.25  . Years: 23.00  . Pack years: 5.75  . Types: Cigars  . Quit date: 01/02/2018  . Years since quitting: 0.5  Smokeless Tobacco Never Used  Tobacco Comment   quit cigs in 2011 and smokes cigars daily- from 3-10 cigars-09/27/13    Goals Met:  Exercise tolerated well  Goals Unmet:  Not Applicable  Comments: Patient returned to cardiac rehab after department closure due to COVID 19. Social distancing and safety measures are in place. Patient tolerated his first day of exercise without difficulty. VSS. Telemetry: A Fib.   Dr. Fransico Him is Medical Director for Cardiac Rehab at [facility].

## 2018-07-29 ENCOUNTER — Encounter (HOSPITAL_COMMUNITY): Payer: BC Managed Care – PPO

## 2018-07-29 ENCOUNTER — Encounter (HOSPITAL_COMMUNITY)
Admission: RE | Admit: 2018-07-29 | Discharge: 2018-07-29 | Disposition: A | Discharge status: Home / Self Care | Payer: BC Managed Care – PPO | Source: Ambulatory Visit | Attending: Cardiology | Admitting: Cardiology

## 2018-07-29 ENCOUNTER — Other Ambulatory Visit: Payer: Self-pay

## 2018-07-29 ENCOUNTER — Telehealth (HOSPITAL_COMMUNITY): Payer: Self-pay | Admitting: Radiology

## 2018-07-29 DIAGNOSIS — Z951 Presence of aortocoronary bypass graft: Secondary | ICD-10-CM | POA: Diagnosis not present

## 2018-07-29 DIAGNOSIS — Z952 Presence of prosthetic heart valve: Secondary | ICD-10-CM

## 2018-07-29 NOTE — Telephone Encounter (Signed)
Called to verify if cancellation of echocardiogram appointment is correct.

## 2018-07-29 NOTE — Telephone Encounter (Signed)
Called to verify if cancellation of echo appointment by phone tree is correct.

## 2018-08-01 ENCOUNTER — Encounter (HOSPITAL_COMMUNITY): Payer: BC Managed Care – PPO

## 2018-08-01 ENCOUNTER — Ambulatory Visit (HOSPITAL_COMMUNITY): Payer: BC Managed Care – PPO | Attending: Internal Medicine

## 2018-08-01 ENCOUNTER — Other Ambulatory Visit: Payer: Self-pay

## 2018-08-01 DIAGNOSIS — I519 Heart disease, unspecified: Secondary | ICD-10-CM | POA: Diagnosis not present

## 2018-08-01 MED ORDER — PERFLUTREN LIPID MICROSPHERE
1.0000 mL | INTRAVENOUS | Status: AC | PRN
  Administered 2018-08-01: 2 mL via INTRAVENOUS

## 2018-08-02 ENCOUNTER — Telehealth: Payer: Self-pay

## 2018-08-02 NOTE — Telephone Encounter (Signed)
   Pt updated with visitor's policy and informed to wear a mask. Pt voiced understanding.   COVID-19 Pre-Screening Questions:  . In the past 7 to 10 days have you had a cough,  shortness of breath, headache, congestion, fever (100 or greater) body aches, chills, sore throat, or sudden loss of taste or sense of smell? No . Have you been around anyone with known Covid 19. No . Have you been around anyone who is awaiting Covid 19 test results in the past 7 to 10 days? No . Have you been around anyone who has been exposed to Covid 19, or has mentioned symptoms of Covid 19 within the past 7 to 10 days? No  If you have any concerns/questions about symptoms patients report during screening (either on the phone or at threshold). Contact the provider seeing the patient or DOD for further guidance.  If neither are available contact a member of the leadership team.

## 2018-08-03 ENCOUNTER — Other Ambulatory Visit: Payer: Self-pay

## 2018-08-03 ENCOUNTER — Encounter (HOSPITAL_COMMUNITY): Payer: BC Managed Care – PPO

## 2018-08-03 ENCOUNTER — Encounter (HOSPITAL_COMMUNITY)
Admission: RE | Admit: 2018-08-03 | Discharge: 2018-08-03 | Disposition: A | Discharge status: Home / Self Care | Payer: BC Managed Care – PPO | Source: Ambulatory Visit | Attending: Cardiology | Admitting: Cardiology

## 2018-08-03 VITALS — BP 104/72 | HR 86 | Temp 97.9°F | Wt 204.1 lb

## 2018-08-03 DIAGNOSIS — Z952 Presence of prosthetic heart valve: Secondary | ICD-10-CM

## 2018-08-03 DIAGNOSIS — Z951 Presence of aortocoronary bypass graft: Secondary | ICD-10-CM | POA: Diagnosis not present

## 2018-08-04 ENCOUNTER — Telehealth: Payer: Self-pay | Admitting: Cardiology

## 2018-08-04 NOTE — Telephone Encounter (Signed)
I called pt to confirm his appt  With Dr Harrell Gave on 08-05-18.        COVID-19 Pre-Screening Questions:   In the past 7 to 10 days have you had a cough,  shortness of breath, headache, congestion, fever (100 or greater) body aches, chills, sore throat, or sudden loss of taste or sense of smell? no  Have you been around anyone with known Covid 19.  Have you been around anyone who is awaiting Covid 19 test results in the past 7 to 10 days? no  Have you been around anyone who has been exposed to Covid 19, or has mentioned symptoms of Covid 19 within the past 7 to 10 days? no  If you have any concerns/questions about symptoms patients report during screening (either on the phone or at threshold). Contact the provider seeing the patient or DOD for further guidance.  If neither are available contact a member of the leadership team.

## 2018-08-05 ENCOUNTER — Telehealth: Payer: Self-pay | Admitting: Cardiology

## 2018-08-05 ENCOUNTER — Other Ambulatory Visit: Payer: Self-pay

## 2018-08-05 ENCOUNTER — Encounter (HOSPITAL_COMMUNITY): Payer: BC Managed Care – PPO

## 2018-08-05 ENCOUNTER — Encounter: Payer: Self-pay | Admitting: Cardiology

## 2018-08-05 ENCOUNTER — Ambulatory Visit: Payer: BC Managed Care – PPO | Admitting: Cardiology

## 2018-08-05 VITALS — BP 114/60 | HR 99 | Ht 68.0 in | Wt 205.8 lb

## 2018-08-05 DIAGNOSIS — I4821 Permanent atrial fibrillation: Secondary | ICD-10-CM | POA: Diagnosis not present

## 2018-08-05 DIAGNOSIS — Z952 Presence of prosthetic heart valve: Secondary | ICD-10-CM | POA: Diagnosis not present

## 2018-08-05 DIAGNOSIS — Z951 Presence of aortocoronary bypass graft: Secondary | ICD-10-CM | POA: Diagnosis not present

## 2018-08-05 DIAGNOSIS — Z9889 Other specified postprocedural states: Secondary | ICD-10-CM

## 2018-08-05 MED ORDER — JARDIANCE 10 MG PO TABS: 10.0000 mg | ORAL_TABLET | Freq: Every day | ORAL | 11 refills | Status: DC

## 2018-08-05 NOTE — Telephone Encounter (Signed)
Called patient. Gave him number for Jardiance patient assistance. Advised him to call back if that wasn't helpful. Patient verbally understands. No further questions

## 2018-08-05 NOTE — Telephone Encounter (Signed)
  Dr Harrell Gave called in a prescription for empagliflozin (JARDIANCE) 10 MG TABS tablet and he was told if he needed assistance with the cost to let us know. He would like to speak with someone regarding assistance.

## 2018-08-05 NOTE — Progress Notes (Signed)
Cardiology Office Note:    Date:  08/05/2018   ID:  Zebedee Iba Fiddler, DOB 1955/09/06, MRN [id number]  PCP:  [name], MD  Cardiologist:  Buford Dresser, MD PhD  Referring MD: [name], MD   CC: post op follow up  History of Present Illness:    Justin [name] is a 63 y.o. male with a hx of obesity, diabetes, tobacco use, hypertension, peripheral vascular disease who is seen in follow up for atrial fibrillation and s/p R CEA and CABG with AVR 01/03/18 for bicuspid aortic valve stenosis. My initial visit with him was on 12/07/17.   Today:  Working on disability forms. Officially retired in April 2020.  Having acid reflux, has noticed yellow reflux on pillowcase/beard when he wakes up. Trying PPI. Plans to follow up with PCP/GI about this. Denies any melena/hematochezia.hematemesis.  Weight up slightly, improved with lasix/potassium but has gradually been drifting up. Doing cardiac rehab. Appetite has improved. Remains tobacco free, rare alcohol.   Claudication improving. Left leg treating him well (worst ABI), right with some numbness from vein harvest.  Denies chest pain, shortness of breath at rest or with normal exertion. No PND, orthopnea, LE edema or unexpected weight gain. No syncope or palpitations.  Past Medical History:  Diagnosis Date  . Acute meniscal tear of knee LEFT  . Aortic stenosis 12/01/2017   NONRHEUMATIC, AORTIC VALVE CALCIFICATIONS, MILD TO MODERATE REGURG, MILD TO MODERATE CALCIFIED ANNULUS per ECHO 10/25/17 @ [facility]  . Arthritis   . Atrial fibrillation ([place]) 11/12/2017   AT O/V WITH PCP  . DM (diabetes mellitus) ([place])   . Heart murmur MILD-- ASYMPTOMATIC  . Hyperlipidemia   . Hypertension   . Left knee pain   . PAD (peripheral artery disease) ([facility])    left leg claudication    Past Surgical History:  Procedure Laterality Date  . AORTIC VALVE REPLACEMENT N/A 01/03/2018   Procedure: AORTIC VALVE REPLACEMENT (AVR)  using 72mm Magna Ease Bioprosthesis Aortic Valve;  Surgeon: Ivin Poot, MD;  Location: [facility];  Service: Open Heart Surgery;  Laterality: N/A;  . APPENDECTOMY  1998  . CATARACT EXTRACTION W/ INTRAOCULAR LENS  IMPLANT, BILATERAL  1998/  2000  . CERVICAL FUSION  1985   C4 - 5  . CORONARY ARTERY BYPASS GRAFT N/A 01/03/2018   Procedure: CORONARY ARTERY BYPASS GRAFTING (CABG) x 4 (LIMA to LAD, SVG to DIAGONAL, SVG to RAMUS INTERMEDIATE, and SVG to PDA), USING LEFT INFTERNAL MAMMARY ARTERY AND;  Surgeon: Prescott Gum, Collier Salina, MD;  Location: [facility];  Service: Open Heart Surgery;  Laterality: N/A;  . ENDARTERECTOMY Right 01/03/2018   Procedure: ENDARTERECTOMY CAROTID;  Surgeon: Marty Heck, MD;  Location: [facility];  Service: Vascular;  Laterality: Right;  . KNEE ARTHROSCOPY W/ MENISCECTOMY  1991   LEFT KNEE  . NASAL SINUS SURGERY  1982  . RIGHT/LEFT HEART CATH AND CORONARY ANGIOGRAPHY N/A 12/24/2017   Procedure: RIGHT/LEFT HEART CATH AND CORONARY ANGIOGRAPHY;  Surgeon: Jolaine Artist, MD;  Location: [facility];  Service: Cardiovascular;  Laterality: N/A;  . ROTATOR CUFF REPAIR  09-04-2005   LEFT SHOULDER  . TEE WITHOUT CARDIOVERSION N/A 12/24/2017   Procedure: TRANSESOPHAGEAL ECHOCARDIOGRAM (TEE);  Surgeon: Jolaine Artist, MD;  [facility];  Service: Cardiovascular;  Laterality: N/A;  . TEE WITHOUT CARDIOVERSION N/A 01/03/2018   Procedure: TRANSESOPHAGEAL ECHOCARDIOGRAM (TEE);  Surgeon: Prescott Gum, Collier Salina, MD;  Location: [facility];  Service: Open Heart Surgery;  Laterality: N/A;  .  TONSILLECTOMY  AGE 46    Current Medications: Current Outpatient Medications on File Prior to Visit  Medication Sig  . amoxicillin (AMOXIL) 500 MG capsule Take 1 capsule (500 mg total) by mouth once as needed for up to 4 doses. Take four 500 mg capsules by mouth 1 hour before dental visits  . apixaban (ELIQUIS) 5 MG TABS tablet Take 1 tablet (5 mg total) by mouth 2 (two) times daily.  Marland Kitchen  aspirin 81 MG tablet Take 81 mg by mouth daily.   . Cholecalciferol 125 MCG (5000 UT) capsule Take 1 capsule by mouth daily.   . cyclobenzaprine (FLEXERIL) 10 MG tablet Take 10 mg by mouth 3 (three) times daily.   . fluticasone (FLONASE) 50 MCG/ACT nasal spray Place 2 sprays into both nostrils daily.  . furosemide (LASIX) 20 MG tablet Take 20 mg by mouth as needed.   . hydroxyurea (HYDREA) 500 MG capsule TAKE 2 CAPSULES IN THE MORNING AND 1 CAPS AT NIGHT. MAY TAKE WITH FOOD TO MINIMIZE GI SIDE EFFECTS.  Marland Kitchen loratadine (CLARITIN) 10 MG tablet Take 10 mg by mouth daily.  . metFORMIN (GLUCOPHAGE-XR) 500 MG 24 hr tablet Take 500 mg by mouth daily before supper.  . metoprolol succinate (TOPROL XL) 25 MG 24 hr tablet Take 1 tablet (25 mg total) by mouth daily.  . nefazodone (SERZONE) 100 MG tablet Take 100 mg by mouth 2 (two) times daily.  Marland Kitchen omeprazole (PRILOSEC) 40 MG capsule Take 1 capsule (40 mg total) by mouth 2 (two) times a day.  Marland Kitchen POTASSIUM CHLORIDE PO Take 1 tablet by mouth as needed.  . pregabalin (LYRICA) 50 MG capsule Take 50 mg by mouth 3 (three) times daily.  . rosuvastatin (CRESTOR) 20 MG tablet Take 1 tablet (20 mg total) by mouth daily.  Marland Kitchen zolpidem (AMBIEN) 10 MG tablet Take 10 mg by mouth at bedtime as needed.   No current facility-administered medications on file prior to visit.     Outpatient Encounter Medications as of 08/05/2018  Medication Sig  . amoxicillin (AMOXIL) 500 MG capsule Take 1 capsule (500 mg total) by mouth once as needed for up to 4 doses. Take four 500 mg capsules by mouth 1 hour before dental visits  . apixaban (ELIQUIS) 5 MG TABS tablet Take 1 tablet (5 mg total) by mouth 2 (two) times daily.  Marland Kitchen aspirin 81 MG tablet Take 81 mg by mouth daily.   . Cholecalciferol 125 MCG (5000 UT) capsule Take 1 capsule by mouth daily.   . cyclobenzaprine (FLEXERIL) 10 MG tablet Take 10 mg by mouth 3 (three) times daily.   . fluticasone (FLONASE) 50 MCG/ACT nasal spray Place 2  sprays into both nostrils daily.  . furosemide (LASIX) 20 MG tablet Take 20 mg by mouth as needed.   . hydroxyurea (HYDREA) 500 MG capsule TAKE 2 CAPSULES IN THE MORNING AND 1 CAPS AT NIGHT. MAY TAKE WITH FOOD TO MINIMIZE GI SIDE EFFECTS.  Marland Kitchen loratadine (CLARITIN) 10 MG tablet Take 10 mg by mouth daily.  . metFORMIN (GLUCOPHAGE-XR) 500 MG 24 hr tablet Take 500 mg by mouth daily before supper.  . metoprolol succinate (TOPROL XL) 25 MG 24 hr tablet Take 1 tablet (25 mg total) by mouth daily.  . nefazodone (SERZONE) 100 MG tablet Take 100 mg by mouth 2 (two) times daily.  Marland Kitchen omeprazole (PRILOSEC) 40 MG capsule Take 1 capsule (40 mg total) by mouth 2 (two) times a day.  Marland Kitchen POTASSIUM CHLORIDE PO Take 1 tablet  by mouth as needed.  . pregabalin (LYRICA) 50 MG capsule Take 50 mg by mouth 3 (three) times daily.  . rosuvastatin (CRESTOR) 20 MG tablet Take 1 tablet (20 mg total) by mouth daily.  Marland Kitchen zolpidem (AMBIEN) 10 MG tablet Take 10 mg by mouth at bedtime as needed.   No facility-administered encounter medications on file as of 08/05/2018.    Allergies:   Codeine   Social History   Socioeconomic History  . Marital status: Married    Spouse name: Not on file  . Number of children: Not on file  . Years of education: Not on file  . Highest education level: Not on file  Occupational History  . Not on file  Social Needs  . Financial resource strain: Not on file  . Food insecurity    Worry: Not on file    Inability: Not on file  . Transportation needs    Medical: Not on file    Non-medical: Not on file  Tobacco Use  . Smoking status: Former Smoker    Packs/day: 0.25    Years: 23.00    Pack years: 5.75    Types: Cigars    Quit date: 01/02/2018    Years since quitting: 0.5  . Smokeless tobacco: Never Used  . Tobacco comment: quit cigs in 2011 and smokes cigars daily- from 3-10 cigars-09/27/13  Substance and Sexual Activity  . Alcohol use: Not Currently    Alcohol/week: 0.0 standard drinks     Comment: not now  . Drug use: No  . Sexual activity: Not on file  Lifestyle  . Physical activity    Days per week: Not on file    Minutes per session: Not on file  . Stress: Not on file  Relationships  . Social Herbalist on phone: Not on file    Gets together: Not on file    Attends religious service: Not on file    Active member of club or organization: Not on file    Attends meetings of clubs or organizations: Not on file    Relationship status: Not on file  Other Topics Concern  . Not on file  Social History Narrative  . Not on file     Family History: Family history unknown as he was adopted.  ROS:   Please see the history of present illness.  Additional pertinent ROS:  Constitutional: Negative for chills, fever, night sweats, unintentional weight loss  HENT: Negative for ear pain and hearing loss.   Eyes: Negative for loss of vision and eye pain.  Respiratory: Negative for cough, sputum, wheezing.   Cardiovascular: See HPI. Gastrointestinal: Negative for abdominal pain, melena, and hematochezia. Positive for reflux symptoms Genitourinary: Negative for dysuria and hematuria.  Musculoskeletal: Negative for falls and myalgias.  Skin: Negative for itching and rash.  Neurological: Negative for focal weakness, focal sensory changes and loss of consciousness.  Endo/Heme/Allergies: Does not bruise/bleed easily.   EKGs/Labs/Other Studies Reviewed:    The following studies were reviewed today:  Echo 08/01/18  1. The left ventricle has normal systolic function with an ejection fraction of 60-65%. The cavity size was normal. There is mildly increased left ventricular wall thickness. Left ventricular diastolic Doppler parameters are indeterminate.  2. The right ventricle has normal systolic function.  3. Left atrial size was moderately dilated.  4. A 67mm an [employer] valve is present in the aortic position. Procedure Date: 01/03/18.  5. Aortic valve  prosthesis appears to  open well.  Echo 10/25/17 - Left ventricle: Wall thickness was increased in a pattern of mild   LVH. Systolic function was mildly to moderately reduced. The   estimated ejection fraction was in the range of 40% to 45%. - Aortic valve: Mildly to moderately calcified annulus. Mildly   thickened, moderately calcified leaflets. There was mild to   moderate regurgitation. Valve area (VTI): 0.93 cm^2. Valve area   (Vmax): 0.93 cm^2. Valve area (Vmean): 0.9 cm^2. - Left atrium: The atrium was mildly dilated. - Right atrium: The atrium was mildly dilated.  TEE 12/24/17 - Left ventricle: The estimated ejection fraction was 55%. - Aortic valve: Trileaflet; moderately calcified leaflets; fusion   of the left-noncoronary commissure. There was moderate stenosis. - Mitral valve: No evidence of vegetation. There was mild to   moderate regurgitation. - Left atrium: The atrium was dilated. No evidence of thrombus in   the atrial cavity or appendage. - Right atrium: The atrium was dilated. - Atrial septum: No defect or patent foramen ovale was identified. - Tricuspid valve: No evidence of vegetation. - Pulmonic valve: No evidence of vegetation.  [facility] 12/24/17  Ost RCA to Prox RCA lesion is 95% stenosed.  Prox LAD lesion is 95% stenosed.  Mid RCA lesion is 80% stenosed.  Dist RCA lesion is 99% stenosed.  Post Atrio lesion is 50% stenosed.  Prox Cx to Mid Cx lesion is 80% stenosed.  Mid LM to Dist LM lesion is 50% stenosed.  Ost 1st Diag lesion is 90% stenosed.  There is moderate aortic valve stenosis.   Findings:  RA = 6 RV = 34/5 PA = 34/9 (20) PCW = 21 Fick cardiac output/index = 5.5/2.7 PVR = < 1.0 WU Ao sat = 97% PA sat = 73%, 74%  Assessment: 1. Moderate aortic stenosis 2. Severe 3v CAD 3. Normal LV function 4. Relatively normal hemodynamics Plan/Discussion: Films reviewed with Dr. Prescott Gum. Plan AVR/CABG +/- Maze with Dr. Prescott Gum next  week.   Surgery 01/03/18 Procedure (s):  1. Coronary artery bypass grafting x4 (left internal mammary artery to left anterior descending, saphenous vein graft to diagonal, saphenous vein graft to OM1, saphenous vein graft to posterior descending). 2. Aortic valve replacement with a 23 mm Edwards pericardial valve, serial [id number]. 3. Application of left atrial clip, AtriCure 35 mm. 4. Endoscopic harvest of right leg greater saphenous vein by Dr. Prescott Gum on 01/03/2018.  Also R CEA with bovine patch angioplasty  EKG:  EKG is personally reviewed.  The ekg ordered 01/21/18 demonstrates atrial fibrillation  Recent Labs: 01/04/2018: Magnesium 2.0 07/19/2018: ALT 7; BUN 12; Creatinine 0.80; Hemoglobin 11.2; Platelet Count 249; Potassium 4.2; Sodium 135  Recent Lipid Panel    Component Value Date/Time   CHOL 127 02/17/2018 1014   TRIG 80 02/17/2018 1014   HDL 41 02/17/2018 1014   CHOLHDL 3.1 02/17/2018 1014   VLDL 16 02/17/2018 1014   LDLCALC 70 02/17/2018 1014    Physical Exam:    VS:  BP 114/60   Pulse 99   Ht 5\' 8"  (1.727 m)   Wt 205 lb 12.8 oz (93.4 kg)   SpO2 99%   BMI 31.29 kg/m     Wt Readings from Last 3 Encounters:  08/05/18 205 lb 12.8 oz (93.4 kg)  07/19/18 201 lb (91.2 kg)  06/16/18 198 lb 1.9 oz (89.9 kg)    GEN: Well nourished, well developed in no acute distress HEENT: Normal NECK: No JVD; No carotid bruits LYMPHATICS:  No lymphadenopathy CARDIAC: irregular rhythm, normal S1 and S2, no murmurs, rubs, gallops. Radial pulses 2+ bilaterally. RESPIRATORY:  Clear to auscultation without rales, wheezing or rhonchi  ABDOMEN: Soft, non-tender, non-distended MUSCULOSKELETAL:  No edema; No deformity  SKIN: Warm and dry NEUROLOGIC:  Alert and oriented x 3 PSYCHIATRIC:  Normal affect   ASSESSMENT:    1. History of CEA (carotid endarterectomy)   2. S/P CABG x 4   3. S/P AVR (aortic valve replacement)   4. Permanent atrial fibrillation    PLAN:    S/P R CEA,  CAD s/p CABG x4, AVR (for bicuspid AV stenosis) 12/2017:  -continue metoprolol succinate 25 mg daily -on 81 mg aspirin for valve and CAD -continue rosuvastatin 20 mg (no atorvastatin with interaction w/nefazodone). -with his diabetes and ASCVD, he is high risk for events. Discussed, will start jardiance today. -has endocarditis prophylaxis antibiotics -repeat echo with EF 60-65%, valve functioning well  Atrial fibrillation: rate controlled, does not notice palpitations. Would consider him permanent. Chadsvasc=3, s/p LA clip. His polycythemia also places him at higher risk of clot as well.  -Continue rate control with metoprolol, anticoagulation with apixaban  Claudication: improving -quit smoking. On aspirin and statin. ABIs showed moderate disease on the right, severe disease on the left. Continue to follow  Hypertension: at goal today -continue metoprolol  Reflux symptoms: plans to f/u with PCP. Thinks stools are sometimes dark but not black or tarry. No hematemesis. No hematochezia.  Not discussed in depth today:  Likely sleep apnea: Epworth sleepiness score=12. High likelihood of sleep apnea given body habitus and comorbidities.  -pending sleep study  Secondary Prevention -recommend heart healthy/Mediterranean diet, with whole grains, fruits, vegetable, fish, lean meats, nuts, and olive oil. Limit salt. -recommend moderate walking, 3-5 times/week for 30-50 minutes each session. Aim for at least 150 minutes.week. Goal should be pace of 3 miles/hours, or walking 1.5 miles in 30 minutes -recommend avoidance of tobacco products. Avoid excess alcohol.  Medication Adjustments/Labs and Tests Ordered: Current medicines are reviewed at length with the patient today.  Concerns regarding medicines are outlined above.  No orders of the defined types were placed in this encounter.  Meds ordered this encounter  Medications  . empagliflozin (JARDIANCE) 10 MG TABS tablet    Sig: Take 10 mg by  mouth daily.    Dispense:  30 tablet    Refill:  11    Patient Instructions  Medication Instructions:  Start: Jardiance 10 mg daily  If you need a refill on your cardiac medications before your next appointment, please call your pharmacy.   Lab work: None  Testing/Procedures: None  Follow-Up: At [facility], you and your health needs are our priority.  As part of our continuing mission to provide you with exceptional heart care, we have created designated Provider Care Teams.  These Care Teams include your primary Cardiologist (physician) and Advanced Practice Providers (APPs -  Physician Assistants and Nurse Practitioners) who all work together to provide you with the care you need, when you need it. You will need a follow up appointment in 6 months.  Please call our office 2 months in advance to schedule this appointment.  You may see Buford Dresser, MD or one of the following Advanced Practice Providers on your designated Care Team:   Rosaria Ferries, PA-C . Jory Sims, DNP, ANP      Signed, Buford Dresser, MD PhD 08/05/2018 4:55 PM    [facility]

## 2018-08-05 NOTE — Patient Instructions (Signed)
Medication Instructions:  Start: Jardiance 10 mg daily  If you need a refill on your cardiac medications before your next appointment, please call your pharmacy.   Lab work: None  Testing/Procedures: None  Follow-Up: At [facility], you and your health needs are our priority.  As part of our continuing mission to provide you with exceptional heart care, we have created designated Provider Care Teams.  These Care Teams include your primary Cardiologist (physician) and Advanced Practice Providers (APPs -  Physician Assistants and Nurse Practitioners) who all work together to provide you with the care you need, when you need it. You will need a follow up appointment in 6 months.  Please call our office 2 months in advance to schedule this appointment.  You may see Buford Dresser, MD or one of the following Advanced Practice Providers on your designated Care Team:   Rosaria Ferries, PA-C . Jory Sims, DNP, ANP

## 2018-08-08 ENCOUNTER — Telehealth (HOSPITAL_COMMUNITY): Payer: Self-pay | Admitting: Family Medicine

## 2018-08-08 ENCOUNTER — Encounter (HOSPITAL_COMMUNITY): Payer: BC Managed Care – PPO

## 2018-08-09 ENCOUNTER — Other Ambulatory Visit: Payer: Self-pay

## 2018-08-09 ENCOUNTER — Inpatient Hospital Stay (HOSPITAL_COMMUNITY)
Admission: EM | Admit: 2018-08-09 | Discharge: 2018-08-13 | DRG: 378 | Disposition: A | Discharge status: Home / Self Care | Payer: BC Managed Care – PPO | Attending: Internal Medicine | Admitting: Internal Medicine

## 2018-08-09 ENCOUNTER — Encounter (HOSPITAL_COMMUNITY): Payer: Self-pay | Admitting: Emergency Medicine

## 2018-08-09 ENCOUNTER — Emergency Department (HOSPITAL_COMMUNITY): Payer: BC Managed Care – PPO

## 2018-08-09 DIAGNOSIS — Z981 Arthrodesis status: Secondary | ICD-10-CM

## 2018-08-09 DIAGNOSIS — K2951 Unspecified chronic gastritis with bleeding: Secondary | ICD-10-CM | POA: Diagnosis not present

## 2018-08-09 DIAGNOSIS — Z1159 Encounter for screening for other viral diseases: Secondary | ICD-10-CM

## 2018-08-09 DIAGNOSIS — Z87891 Personal history of nicotine dependence: Secondary | ICD-10-CM

## 2018-08-09 DIAGNOSIS — Z953 Presence of xenogenic heart valve: Secondary | ICD-10-CM

## 2018-08-09 DIAGNOSIS — D696 Thrombocytopenia, unspecified: Secondary | ICD-10-CM | POA: Diagnosis present

## 2018-08-09 DIAGNOSIS — Z7982 Long term (current) use of aspirin: Secondary | ICD-10-CM

## 2018-08-09 DIAGNOSIS — Z7984 Long term (current) use of oral hypoglycemic drugs: Secondary | ICD-10-CM | POA: Diagnosis not present

## 2018-08-09 DIAGNOSIS — Z951 Presence of aortocoronary bypass graft: Secondary | ICD-10-CM | POA: Diagnosis not present

## 2018-08-09 DIAGNOSIS — Z7951 Long term (current) use of inhaled steroids: Secondary | ICD-10-CM

## 2018-08-09 DIAGNOSIS — I482 Chronic atrial fibrillation, unspecified: Secondary | ICD-10-CM | POA: Diagnosis not present

## 2018-08-09 DIAGNOSIS — I2510 Atherosclerotic heart disease of native coronary artery without angina pectoris: Secondary | ICD-10-CM | POA: Diagnosis present

## 2018-08-09 DIAGNOSIS — I10 Essential (primary) hypertension: Secondary | ICD-10-CM | POA: Diagnosis present

## 2018-08-09 DIAGNOSIS — I213 ST elevation (STEMI) myocardial infarction of unspecified site: Secondary | ICD-10-CM | POA: Diagnosis not present

## 2018-08-09 DIAGNOSIS — G4730 Sleep apnea, unspecified: Secondary | ICD-10-CM | POA: Diagnosis present

## 2018-08-09 DIAGNOSIS — K449 Diaphragmatic hernia without obstruction or gangrene: Secondary | ICD-10-CM | POA: Diagnosis present

## 2018-08-09 DIAGNOSIS — K250 Acute gastric ulcer with hemorrhage: Principal | ICD-10-CM | POA: Diagnosis present

## 2018-08-09 DIAGNOSIS — Z7901 Long term (current) use of anticoagulants: Secondary | ICD-10-CM | POA: Diagnosis not present

## 2018-08-09 DIAGNOSIS — R0682 Tachypnea, not elsewhere classified: Secondary | ICD-10-CM

## 2018-08-09 DIAGNOSIS — I4891 Unspecified atrial fibrillation: Secondary | ICD-10-CM | POA: Diagnosis present

## 2018-08-09 DIAGNOSIS — Z885 Allergy status to narcotic agent status: Secondary | ICD-10-CM | POA: Diagnosis not present

## 2018-08-09 DIAGNOSIS — Z20828 Contact with and (suspected) exposure to other viral communicable diseases: Secondary | ICD-10-CM | POA: Diagnosis not present

## 2018-08-09 DIAGNOSIS — E785 Hyperlipidemia, unspecified: Secondary | ICD-10-CM | POA: Diagnosis not present

## 2018-08-09 DIAGNOSIS — D45 Polycythemia vera: Secondary | ICD-10-CM | POA: Diagnosis present

## 2018-08-09 DIAGNOSIS — D473 Essential (hemorrhagic) thrombocythemia: Secondary | ICD-10-CM | POA: Diagnosis present

## 2018-08-09 DIAGNOSIS — K922 Gastrointestinal hemorrhage, unspecified: Secondary | ICD-10-CM | POA: Diagnosis not present

## 2018-08-09 DIAGNOSIS — E1151 Type 2 diabetes mellitus with diabetic peripheral angiopathy without gangrene: Secondary | ICD-10-CM | POA: Diagnosis not present

## 2018-08-09 DIAGNOSIS — K921 Melena: Secondary | ICD-10-CM

## 2018-08-09 DIAGNOSIS — I4819 Other persistent atrial fibrillation: Secondary | ICD-10-CM | POA: Diagnosis not present

## 2018-08-09 DIAGNOSIS — D649 Anemia, unspecified: Secondary | ICD-10-CM | POA: Diagnosis not present

## 2018-08-09 DIAGNOSIS — Z8052 Family history of malignant neoplasm of bladder: Secondary | ICD-10-CM | POA: Diagnosis not present

## 2018-08-09 DIAGNOSIS — D62 Acute posthemorrhagic anemia: Secondary | ICD-10-CM

## 2018-08-09 DIAGNOSIS — M1990 Unspecified osteoarthritis, unspecified site: Secondary | ICD-10-CM | POA: Diagnosis not present

## 2018-08-09 DIAGNOSIS — D509 Iron deficiency anemia, unspecified: Secondary | ICD-10-CM | POA: Diagnosis present

## 2018-08-09 DIAGNOSIS — I959 Hypotension, unspecified: Secondary | ICD-10-CM | POA: Diagnosis present

## 2018-08-09 DIAGNOSIS — K760 Fatty (change of) liver, not elsewhere classified: Secondary | ICD-10-CM

## 2018-08-09 DIAGNOSIS — R0602 Shortness of breath: Secondary | ICD-10-CM

## 2018-08-09 DIAGNOSIS — D539 Nutritional anemia, unspecified: Secondary | ICD-10-CM | POA: Diagnosis present

## 2018-08-09 DIAGNOSIS — R7401 Elevation of levels of liver transaminase levels: Secondary | ICD-10-CM

## 2018-08-09 DIAGNOSIS — K219 Gastro-esophageal reflux disease without esophagitis: Secondary | ICD-10-CM | POA: Diagnosis present

## 2018-08-09 DIAGNOSIS — R55 Syncope and collapse: Secondary | ICD-10-CM | POA: Diagnosis not present

## 2018-08-09 DIAGNOSIS — K2970 Gastritis, unspecified, without bleeding: Secondary | ICD-10-CM | POA: Diagnosis not present

## 2018-08-09 LAB — CBG MONITORING, ED: Glucose-Capillary: 103 mg/dL — ABNORMAL HIGH (ref 70–99)

## 2018-08-09 LAB — POC OCCULT BLOOD, ED: Fecal Occult Bld: POSITIVE — AB

## 2018-08-09 MED ORDER — SODIUM CHLORIDE 0.9 % IV SOLN
8.0000 mg/h | INTRAVENOUS | Status: DC
  Administered 2018-08-10 (×2): 8 mg/h via INTRAVENOUS
  Filled 2018-08-09 (×4): qty 80

## 2018-08-09 MED ORDER — SODIUM CHLORIDE 0.9 % IV SOLN
80.0000 mg | Freq: Once | INTRAVENOUS | Status: AC
  Administered 2018-08-10: 80 mg via INTRAVENOUS
  Filled 2018-08-09: qty 80

## 2018-08-09 NOTE — Telephone Encounter (Signed)
Follow Up   Patient calling back in with assistance with managing his medication. He has questions on when he is supposed to be taking the Jardiance in the morning or night and whether he should discontinue any of his other medication while taking this new medication. He states that he is having dizzy spells and has been having them since noon on Saturday.    STAT if patient feels like he/she is going to faint   1) Are you dizzy now?  Yes  2) Do you feel faint or have you passed out? Yes  3) Do you have any other symptoms? Having trouble walking, fatigued, a little SOB  4) Have you checked your HR and BP (record if available)? 70/50 108

## 2018-08-09 NOTE — ED Provider Notes (Signed)
[facility] EMERGENCY DEPARTMENT Provider Note   CSN: [id number] Arrival date & time: 08/09/18  2251     History   Chief Complaint Chief Complaint  Patient presents with  . Near Syncope    HPI Justin [name] is a 63 y.o. male with a hx of aortic stenosis, afib, chronic anti-coagulation on Eliquis, DM, HTN, hyperlipidemia, & CAD s/p CABG who presents to the ED Via EMS for near syncope this evening. Patient reports that for the past few days he has had issues with intermittent lightheadedness/dizziness associated w/ dyspnea primarily with position changes & with ambulation, fairly asymptomatic @ rest. No other alleviating/aggravating factors. He states at times he has felt he may pass out, this was the worst tonight when he was ambulating, he states he started to lower himself to the ground & somewhat fell onto his R knee. Denies head injury or LOC/syncope. 911 was called. Per EMS to triage- patient hypotensives in the 41O systolic- given 878 cc of fluids en route with improvement. Patient states he has had some poor PO intake recently. Notes new medicine- jardiance started yesterday, otherwise no med changes. He does admit to somewhat dark stool on further questioning, denies frequent EtOH use or frequent use of NSAIDs, is on Eliquis. Last colonoscopy 5 years prior- normal, no prior upper endoscopy. Denies visual disturbance, numbness, weakness, syncope, chest pain, N/V/D, or abdominal pain.      HPI  Past Medical History:  Diagnosis Date  . Acute meniscal tear of knee LEFT  . Aortic stenosis 12/01/2017   NONRHEUMATIC, AORTIC VALVE CALCIFICATIONS, MILD TO MODERATE REGURG, MILD TO MODERATE CALCIFIED ANNULUS per ECHO 10/25/17 @ [facility]  . Arthritis   . Atrial fibrillation ([place]) 11/12/2017   AT O/V WITH PCP  . DM (diabetes mellitus) ([place])   . Heart murmur MILD-- ASYMPTOMATIC  . Hyperlipidemia   . Hypertension   . Left knee pain   . PAD (peripheral  artery disease) ([facility])    left leg claudication    Patient Active Problem List   Diagnosis Date Noted  . Normocytic anemia 07/19/2018  . History of CEA (carotid endarterectomy) 02/09/2018  . History of tobacco use 02/02/2018  . Carotid stenosis 01/25/2018  . S/P CABG x 4 01/03/2018  . S/P AVR (aortic valve replacement) 01/03/2018  . Coronary artery disease 01/03/2018  . Polycythemia vera ([place]) 12/21/2017  . Bicuspid aortic valve determined by imaging 12/09/2017  . Claudication ([place]) 12/09/2017  . Sleep apnea 12/09/2017  . Left ventricular systolic dysfunction without heart failure 12/09/2017  . Aortic stenosis 12/01/2017  . Erythrocytosis 11/19/2017  . Atrial fibrillation ([place]) 11/12/2017  . Essential hypertension 10/19/2013  . Pulmonary infiltrates 09/27/2013  . Cough 09/27/2013    Past Surgical History:  Procedure Laterality Date  . AORTIC VALVE REPLACEMENT N/A 01/03/2018   Procedure: AORTIC VALVE REPLACEMENT (AVR) using 63mm Magna Ease Bioprosthesis Aortic Valve;  Surgeon: Ivin Poot, MD;  Location: [facility];  Service: Open Heart Surgery;  Laterality: N/A;  . APPENDECTOMY  1998  . CATARACT EXTRACTION W/ INTRAOCULAR LENS  IMPLANT, BILATERAL  1998/  2000  . CERVICAL FUSION  1985   C4 - 5  . CORONARY ARTERY BYPASS GRAFT N/A 01/03/2018   Procedure: CORONARY ARTERY BYPASS GRAFTING (CABG) x 4 (LIMA to LAD, SVG to DIAGONAL, SVG to RAMUS INTERMEDIATE, and SVG to PDA), USING LEFT INFTERNAL MAMMARY ARTERY AND;  Surgeon: Prescott Gum, Collier Salina, MD;  Location: [facility];  Service: Open Heart Surgery;  Laterality: N/A;  . ENDARTERECTOMY Right 01/03/2018   Procedure: ENDARTERECTOMY CAROTID;  Surgeon: Marty Heck, MD;  Location: [facility];  Service: Vascular;  Laterality: Right;  . KNEE ARTHROSCOPY W/ MENISCECTOMY  1991   LEFT KNEE  . NASAL SINUS SURGERY  1982  . RIGHT/LEFT HEART CATH AND CORONARY ANGIOGRAPHY N/A 12/24/2017   Procedure: RIGHT/LEFT HEART CATH AND CORONARY ANGIOGRAPHY;   Surgeon: Jolaine Artist, MD;  Location: [facility];  Service: Cardiovascular;  Laterality: N/A;  . ROTATOR CUFF REPAIR  09-04-2005   LEFT SHOULDER  . TEE WITHOUT CARDIOVERSION N/A 12/24/2017   Procedure: TRANSESOPHAGEAL ECHOCARDIOGRAM (TEE);  Surgeon: Jolaine Artist, MD;  Location: [facility];  Service: Cardiovascular;  Laterality: N/A;  . TEE WITHOUT CARDIOVERSION N/A 01/03/2018   Procedure: TRANSESOPHAGEAL ECHOCARDIOGRAM (TEE);  Surgeon: Prescott Gum, Collier Salina, MD;  Location: [facility];  Service: Open Heart Surgery;  Laterality: N/A;  . TONSILLECTOMY  AGE 38        Home Medications    Prior to Admission medications   Medication Sig Start Date End Date Taking? Authorizing Provider  amoxicillin (AMOXIL) 500 MG capsule Take 1 capsule (500 mg total) by mouth once as needed for up to 4 doses. Take four 500 mg capsules by mouth 1 hour before dental visits 07/01/18   Prescott Gum, Collier Salina, MD  apixaban (ELIQUIS) 5 MG TABS tablet Take 1 tablet (5 mg total) by mouth 2 (two) times daily. 01/11/18   Nani Skillern, PA-C  aspirin 81 MG tablet Take 81 mg by mouth daily.     [provider]  Cholecalciferol 125 MCG (5000 UT) capsule Take 1 capsule by mouth daily.     [provider]  cyclobenzaprine (FLEXERIL) 10 MG tablet Take 10 mg by mouth 3 (three) times daily.     [provider]  empagliflozin (JARDIANCE) 10 MG TABS tablet Take 10 mg by mouth daily. 08/05/18   Buford Dresser, MD  fluticasone (FLONASE) 50 MCG/ACT nasal spray Place 2 sprays into both nostrils daily.    [provider]  furosemide (LASIX) 20 MG tablet Take 20 mg by mouth as needed.  04/17/18   [provider]  hydroxyurea (HYDREA) 500 MG capsule TAKE 2 CAPSULES IN THE MORNING AND 1 CAPS AT NIGHT. MAY TAKE WITH FOOD TO MINIMIZE GI SIDE EFFECTS. 07/22/18   Tish Men, MD  loratadine (CLARITIN) 10 MG tablet Take 10 mg by mouth daily.    [provider]  metFORMIN  (GLUCOPHAGE-XR) 500 MG 24 hr tablet Take 500 mg by mouth daily before supper.    [provider]  metoprolol succinate (TOPROL XL) 25 MG 24 hr tablet Take 1 tablet (25 mg total) by mouth daily. 02/09/18   Buford Dresser, MD  nefazodone (SERZONE) 100 MG tablet Take 100 mg by mouth 2 (two) times daily.    [provider]  omeprazole (PRILOSEC) 40 MG capsule Take 1 capsule (40 mg total) by mouth 2 (two) times a day. 06/30/18   Cirigliano, Vito V, DO  POTASSIUM CHLORIDE PO Take 1 tablet by mouth as needed.    [provider]  pregabalin (LYRICA) 50 MG capsule Take 50 mg by mouth 3 (three) times daily.    [provider]  rosuvastatin (CRESTOR) 20 MG tablet Take 1 tablet (20 mg total) by mouth daily. 02/09/18 02/04/19  Buford Dresser, MD  zolpidem (AMBIEN) 10 MG tablet Take 10 mg by mouth at bedtime as needed.    [provider]  Family History Family History  Adopted: Yes  Problem Relation Age of Onset  . Bladder Cancer Father        adopted father    Social History Social History   Tobacco Use  . Smoking status: Former Smoker    Packs/day: 0.25    Years: 23.00    Pack years: 5.75    Types: Cigars    Quit date: 01/02/2018    Years since quitting: 0.6  . Smokeless tobacco: Never Used  . Tobacco comment: quit cigs in 2011 and smokes cigars daily- from 3-10 cigars-09/27/13  Substance Use Topics  . Alcohol use: Not Currently    Alcohol/week: 0.0 standard drinks    Comment: not now  . Drug use: No     Allergies   Codeine   Review of Systems Review of Systems  Constitutional: Negative for chills and fever.  HENT: Negative for congestion.   Respiratory: Positive for shortness of breath. Negative for cough.   Cardiovascular: Negative for chest pain, palpitations and leg swelling.  Gastrointestinal: Negative for abdominal pain, diarrhea, nausea and vomiting.       + for dark stool.   Neurological: Positive for dizziness  and light-headedness. Negative for seizures, syncope, speech difficulty, weakness and numbness.  All other systems reviewed and are negative.  Physical Exam Updated Vital Signs BP (!) 128/47   Pulse 92   Temp 98 F (36.7 C) (Oral)   Resp (!) 27   Ht 5\' 8"  (1.727 m)   Wt 89.8 kg   SpO2 100%   BMI 30.11 kg/m   Physical Exam Vitals signs and nursing note reviewed.  Constitutional:      General: He is not in acute distress.    Appearance: He is well-developed. He is not toxic-appearing.  HENT:     Head: Normocephalic and atraumatic.  Eyes:     General:        Right eye: No discharge.        Left eye: No discharge.     Extraocular Movements: Extraocular movements intact.     Pupils: Pupils are equal, round, and reactive to light.     Comments: Conjunctival pallor noted  Neck:     Musculoskeletal: Neck supple.  Cardiovascular:     Rate and Rhythm: Normal rate and regular rhythm.     Comments: 2+ symmetric radial pulses.  Pulmonary:     Effort: Tachypnea present. No respiratory distress.     Breath sounds: Normal breath sounds. No wheezing, rhonchi or rales.     Comments: Patient w/ mild tachypnea, increased with minimal movement throughout stretcher.  Abdominal:     General: There is no distension.     Palpations: Abdomen is soft.     Tenderness: There is no abdominal tenderness. There is no guarding or rebound.  Musculoskeletal:     Comments: Abrasion just inferior to R knee. No obvious deformity. No ecchymosis.  Intact AROM throughout extremities. No point/focal bony tenderness to extremities or midline spinal tenderness.    Skin:    Coloration: Skin is pale.     Findings: No rash.  Neurological:     General: No focal deficit present.     Mental Status: He is alert.     Comments: Clear speech. CN III -XII Grossly intact. Sensation grossly intact x 4. 5/5 symmetric grip strength & strength with plantar/dorsiflexion bilaterally. Normal finger to nose. Negative pronator  drift.   Psychiatric:        Behavior: Behavior normal.  ED Treatments / Results  Labs (all labs ordered are listed, but only abnormal results are displayed) Labs Reviewed  CBC - Abnormal; Notable for the following components:      Result Value   WBC 15.6 (*)    RBC 1.07 (*)    Hemoglobin 4.2 (*)    HCT 13.6 (*)    MCV 127.1 (*)    MCH 39.3 (*)    RDW 22.2 (*)    Platelets 142 (*)    nRBC 3.6 (*)    All other components within normal limits  COMPREHENSIVE METABOLIC PANEL - Abnormal; Notable for the following components:   CO2 18 (*)    Glucose, Bld 129 (*)    BUN 24 (*)    Calcium 8.3 (*)    Total Protein 5.4 (*)    Albumin 2.8 (*)    AST 84 (*)    ALT 55 (*)    All other components within normal limits  CBG MONITORING, ED - Abnormal; Notable for the following components:   Glucose-Capillary 103 (*)    All other components within normal limits  POC OCCULT BLOOD, ED - Abnormal; Notable for the following components:   Fecal Occult Bld POSITIVE (*)    All other components within normal limits  PROTIME-INR  TYPE AND SCREEN  PREPARE RBC (CROSSMATCH)   EKG None  Radiology Dg Chest Portable 1 View  Result Date: 08/09/2018 CLINICAL DATA:  Recent syncopal episode EXAM: PORTABLE CHEST 1 VIEW COMPARISON:  02/15/2018 FINDINGS: Cardiac shadow is enlarged. Postsurgical changes are seen. The lungs are well aerated bilaterally. No focal infiltrate or sizable effusion is noted. No bony abnormality is seen. IMPRESSION: No acute abnormality noted. Electronically Signed   By: Inez Catalina M.D.   On: 08/09/2018 23:31    Procedures .Critical Care Performed by: Amaryllis Dyke, PA-C Authorized by: Amaryllis Dyke, PA-C     (including critical care time) CRITICAL CARE Performed by: Kennith Maes   Total critical care time: 40 minutes  Critical care time was exclusive of separately billable procedures and treating other patients.  Critical care was  necessary to treat or prevent imminent or life-threatening deterioration.  Critical care was time spent personally by me on the following activities: development of treatment plan with patient and/or surrogate as well as nursing, discussions with consultants, evaluation of patient's response to treatment, examination of patient, obtaining history from patient or surrogate, ordering and performing treatments and interventions, ordering and review of laboratory studies, ordering and review of radiographic studies, pulse oximetry and re-evaluation of patient's condition.  Medications Ordered in ED Medications - No data to display   Initial Impression / Assessment and Plan / ED Course  I have reviewed the triage vital signs and the nursing notes.  Pertinent labs & imaging results that were available during my care of the patient were reviewed by me and considered in my medical decision making (see chart for details).   Patient presents to the ED with complaints of intermittent lightheadedness/dyspnea for past few days w/ near syncope shortly PTA. Hypotensive w/ EMS improved w/ 500 ccs of fluid en route. Nontoxic appearing, BP improved on arrival, not tachycardic, notably pale, tachypnea present w/ clear lungs. No focal neuro deficits. Fecal occult performed by Dr. Christy Gentles ---> no blood/melena however fecal occult positive, will initiate protonix- concern for symptomatic anemia.   CBC: Critical anemia w/ hgb 4.2, hct 13.6- new from labs 3 weeks prior. Leukocytosis felt to be nonspecific. Thrombocytopenia w/ platelets @  142 mildly decreased from prior ranges.  CMP: Mild hyperglycemia. BUN somewhat elevated, creatinine WNL. Mild AST/ALT elevation.  CXR: Negative for acute abnormality.  EKG: No STEMI   Orthostatic VS for the past 24 hrs:  BP- Lying Pulse- Lying BP- Sitting Pulse- Sitting BP- Standing at 0 minutes Pulse- Standing at 0 minutes  08/10/18 0010 115/56 102 91/46 90 97/70 122    Orthostatics positive.   Patient with symptomatic anemia w/ GI bleed.  Protonix started in the ED. 2 units PRBCs ordered.  Vital signs fairly stable.  Plan for admission to hospitalist service.   This is a shared visit with supervising physician Dr. Christy Gentles who has independently evaluated patient & provided guidance in evaluation/management/disposition, in agreement with care    Patient admitted by hospitalist Dr. Alcario Drought.   Final Clinical Impressions(s) / ED Diagnoses   Final diagnoses:  Symptomatic anemia  Gastrointestinal hemorrhage, unspecified gastrointestinal hemorrhage type    ED Discharge Orders    None       Amaryllis Dyke, PA-C 08/10/18 0134    Ripley Fraise, MD 08/10/18 [phone]

## 2018-08-09 NOTE — ED Provider Notes (Signed)
Patient seen/examined in the Emergency Department in conjunction with Advanced Practice Provider  Patient reports weakness, lightheadedness and SOB Exam : awake/alert, he is pale, appears dyspneic Rectal  With male chaperone present - minimal stool, no blood/melena Plan: labs pending at this time.     Ripley Fraise, MD 08/09/18 [phone]

## 2018-08-09 NOTE — ED Triage Notes (Addendum)
Pt presents to ED from home BIB [facility]. Pt says he was walking to bathroom became lightheaded and lowered himself to ground. No LOC, no hitting head. Abrasion on knee. + blood thinners. Hx of a. Fib. Per EMS pt initially pale, hypotensive. 500 ml IVF given with EMS. Pt also reports loss of appetite for a few days.

## 2018-08-09 NOTE — Telephone Encounter (Signed)
Spoke with pt's wife who report pt is having trouble walking, fatigue, a little SOB, and feeling like he is going to pass out. She report BP is currently 70/50 and HR 108. Informed wife to have pt report to ER for further evaluations. Wife voiced understanding.

## 2018-08-10 ENCOUNTER — Encounter (HOSPITAL_COMMUNITY): Payer: BC Managed Care – PPO

## 2018-08-10 ENCOUNTER — Inpatient Hospital Stay (HOSPITAL_COMMUNITY): Payer: BC Managed Care – PPO

## 2018-08-10 ENCOUNTER — Other Ambulatory Visit: Payer: Self-pay

## 2018-08-10 DIAGNOSIS — E1151 Type 2 diabetes mellitus with diabetic peripheral angiopathy without gangrene: Secondary | ICD-10-CM | POA: Diagnosis not present

## 2018-08-10 DIAGNOSIS — Z981 Arthrodesis status: Secondary | ICD-10-CM | POA: Diagnosis not present

## 2018-08-10 DIAGNOSIS — Z8052 Family history of malignant neoplasm of bladder: Secondary | ICD-10-CM | POA: Diagnosis not present

## 2018-08-10 DIAGNOSIS — Z7984 Long term (current) use of oral hypoglycemic drugs: Secondary | ICD-10-CM | POA: Diagnosis not present

## 2018-08-10 DIAGNOSIS — D62 Acute posthemorrhagic anemia: Secondary | ICD-10-CM | POA: Diagnosis present

## 2018-08-10 DIAGNOSIS — Z885 Allergy status to narcotic agent status: Secondary | ICD-10-CM | POA: Diagnosis not present

## 2018-08-10 DIAGNOSIS — D45 Polycythemia vera: Secondary | ICD-10-CM

## 2018-08-10 DIAGNOSIS — I2510 Atherosclerotic heart disease of native coronary artery without angina pectoris: Secondary | ICD-10-CM | POA: Diagnosis not present

## 2018-08-10 DIAGNOSIS — K449 Diaphragmatic hernia without obstruction or gangrene: Secondary | ICD-10-CM | POA: Diagnosis present

## 2018-08-10 DIAGNOSIS — K760 Fatty (change of) liver, not elsewhere classified: Secondary | ICD-10-CM | POA: Diagnosis present

## 2018-08-10 DIAGNOSIS — Z951 Presence of aortocoronary bypass graft: Secondary | ICD-10-CM | POA: Diagnosis not present

## 2018-08-10 DIAGNOSIS — Z87891 Personal history of nicotine dependence: Secondary | ICD-10-CM | POA: Diagnosis not present

## 2018-08-10 DIAGNOSIS — Z7982 Long term (current) use of aspirin: Secondary | ICD-10-CM | POA: Diagnosis not present

## 2018-08-10 DIAGNOSIS — I4819 Other persistent atrial fibrillation: Secondary | ICD-10-CM | POA: Diagnosis not present

## 2018-08-10 DIAGNOSIS — Z953 Presence of xenogenic heart valve: Secondary | ICD-10-CM | POA: Diagnosis not present

## 2018-08-10 DIAGNOSIS — D649 Anemia, unspecified: Secondary | ICD-10-CM | POA: Diagnosis not present

## 2018-08-10 DIAGNOSIS — E785 Hyperlipidemia, unspecified: Secondary | ICD-10-CM | POA: Diagnosis not present

## 2018-08-10 DIAGNOSIS — R55 Syncope and collapse: Secondary | ICD-10-CM | POA: Diagnosis present

## 2018-08-10 DIAGNOSIS — K921 Melena: Secondary | ICD-10-CM | POA: Diagnosis not present

## 2018-08-10 DIAGNOSIS — M1990 Unspecified osteoarthritis, unspecified site: Secondary | ICD-10-CM | POA: Diagnosis not present

## 2018-08-10 DIAGNOSIS — K922 Gastrointestinal hemorrhage, unspecified: Secondary | ICD-10-CM

## 2018-08-10 DIAGNOSIS — I482 Chronic atrial fibrillation, unspecified: Secondary | ICD-10-CM | POA: Diagnosis not present

## 2018-08-10 DIAGNOSIS — I10 Essential (primary) hypertension: Secondary | ICD-10-CM | POA: Diagnosis not present

## 2018-08-10 DIAGNOSIS — K250 Acute gastric ulcer with hemorrhage: Secondary | ICD-10-CM | POA: Diagnosis not present

## 2018-08-10 DIAGNOSIS — Z7901 Long term (current) use of anticoagulants: Secondary | ICD-10-CM | POA: Diagnosis not present

## 2018-08-10 DIAGNOSIS — Z7951 Long term (current) use of inhaled steroids: Secondary | ICD-10-CM | POA: Diagnosis not present

## 2018-08-10 DIAGNOSIS — D696 Thrombocytopenia, unspecified: Secondary | ICD-10-CM | POA: Diagnosis present

## 2018-08-10 DIAGNOSIS — Z1159 Encounter for screening for other viral diseases: Secondary | ICD-10-CM | POA: Diagnosis not present

## 2018-08-10 DIAGNOSIS — I959 Hypotension, unspecified: Secondary | ICD-10-CM | POA: Diagnosis present

## 2018-08-10 LAB — GLUCOSE, CAPILLARY
Glucose-Capillary: 101 mg/dL — ABNORMAL HIGH (ref 70–99)
Glucose-Capillary: 106 mg/dL — ABNORMAL HIGH (ref 70–99)
Glucose-Capillary: 109 mg/dL — ABNORMAL HIGH (ref 70–99)
Glucose-Capillary: 130 mg/dL — ABNORMAL HIGH (ref 70–99)
Glucose-Capillary: 92 mg/dL (ref 70–99)
Glucose-Capillary: 98 mg/dL (ref 70–99)

## 2018-08-10 LAB — PROTIME-INR
INR: 1.6 — ABNORMAL HIGH (ref 0.8–1.2)
INR: 1.9 — ABNORMAL HIGH (ref 0.8–1.2)
Prothrombin Time: 18.6 seconds — ABNORMAL HIGH (ref 11.4–15.2)
Prothrombin Time: 21.9 seconds — ABNORMAL HIGH (ref 11.4–15.2)

## 2018-08-10 LAB — PREPARE RBC (CROSSMATCH)

## 2018-08-10 LAB — CBC
HCT: 13.6 % — ABNORMAL LOW (ref 39.0–52.0)
HCT: 24.5 % — ABNORMAL LOW (ref 39.0–52.0)
Hemoglobin: 4.2 g/dL — CL (ref 13.0–17.0)
Hemoglobin: 8.5 g/dL — ABNORMAL LOW (ref 13.0–17.0)
MCH: 34 pg (ref 26.0–34.0)
MCH: 39.3 pg — ABNORMAL HIGH (ref 26.0–34.0)
MCHC: 30.9 g/dL (ref 30.0–36.0)
MCHC: 34.7 g/dL (ref 30.0–36.0)
MCV: 127.1 fL — ABNORMAL HIGH (ref 80.0–100.0)
MCV: 98 fL (ref 80.0–100.0)
Platelets: 122 10*3/uL — ABNORMAL LOW (ref 150–400)
Platelets: 142 10*3/uL — ABNORMAL LOW (ref 150–400)
RBC: 1.07 MIL/uL — ABNORMAL LOW (ref 4.22–5.81)
RBC: 2.5 MIL/uL — ABNORMAL LOW (ref 4.22–5.81)
RDW: 22.2 % — ABNORMAL HIGH (ref 11.5–15.5)
RDW: 22.3 % — ABNORMAL HIGH (ref 11.5–15.5)
WBC: 15.6 10*3/uL — ABNORMAL HIGH (ref 4.0–10.5)
WBC: 18.3 10*3/uL — ABNORMAL HIGH (ref 4.0–10.5)
nRBC: 2.5 % — ABNORMAL HIGH (ref 0.0–0.2)
nRBC: 3.6 % — ABNORMAL HIGH (ref 0.0–0.2)

## 2018-08-10 LAB — HEMOGLOBIN AND HEMATOCRIT, BLOOD
HCT: 25.7 % — ABNORMAL LOW (ref 39.0–52.0)
Hemoglobin: 8.8 g/dL — ABNORMAL LOW (ref 13.0–17.0)

## 2018-08-10 LAB — COMPREHENSIVE METABOLIC PANEL
ALT: 55 U/L — ABNORMAL HIGH (ref 0–44)
ALT: 81 U/L — ABNORMAL HIGH (ref 0–44)
AST: 120 U/L — ABNORMAL HIGH (ref 15–41)
AST: 84 U/L — ABNORMAL HIGH (ref 15–41)
Albumin: 2.7 g/dL — ABNORMAL LOW (ref 3.5–5.0)
Albumin: 2.8 g/dL — ABNORMAL LOW (ref 3.5–5.0)
Alkaline Phosphatase: 57 U/L (ref 38–126)
Alkaline Phosphatase: 64 U/L (ref 38–126)
Anion gap: 10 (ref 5–15)
Anion gap: 11 (ref 5–15)
BUN: 24 mg/dL — ABNORMAL HIGH (ref 8–23)
BUN: 24 mg/dL — ABNORMAL HIGH (ref 8–23)
CO2: 18 mmol/L — ABNORMAL LOW (ref 22–32)
CO2: 19 mmol/L — ABNORMAL LOW (ref 22–32)
Calcium: 8.3 mg/dL — ABNORMAL LOW (ref 8.9–10.3)
Calcium: 8.3 mg/dL — ABNORMAL LOW (ref 8.9–10.3)
Chloride: 107 mmol/L (ref 98–111)
Chloride: 108 mmol/L (ref 98–111)
Creatinine, Ser: 0.99 mg/dL (ref 0.61–1.24)
Creatinine, Ser: 0.99 mg/dL (ref 0.61–1.24)
GFR calc Af Amer: >60 mL/min (ref >60)
GFR calc Af Amer: >60 mL/min (ref >60)
GFR calc non Af Amer: >60 mL/min (ref >60)
GFR calc non Af Amer: >60 mL/min (ref >60)
Glucose, Bld: 118 mg/dL — ABNORMAL HIGH (ref 70–99)
Glucose, Bld: 129 mg/dL — ABNORMAL HIGH (ref 70–99)
Potassium: 4.3 mmol/L (ref 3.5–5.1)
Potassium: 4.4 mmol/L (ref 3.5–5.1)
Sodium: 136 mmol/L (ref 135–145)
Sodium: 137 mmol/L (ref 135–145)
Total Bilirubin: 0.8 mg/dL (ref 0.3–1.2)
Total Bilirubin: 0.9 mg/dL (ref 0.3–1.2)
Total Protein: 5.2 g/dL — ABNORMAL LOW (ref 6.5–8.1)
Total Protein: 5.4 g/dL — ABNORMAL LOW (ref 6.5–8.1)

## 2018-08-10 LAB — HIV ANTIBODY (ROUTINE TESTING W REFLEX): HIV Screen 4th Generation wRfx: NONREACTIVE

## 2018-08-10 LAB — HEMOGLOBIN A1C
Hgb A1c MFr Bld: 4.9 % (ref 4.8–5.6)
Mean Plasma Glucose: 93.93 mg/dL

## 2018-08-10 LAB — SARS CORONAVIRUS 2 BY RT PCR (HOSPITAL ORDER, PERFORMED IN ~~LOC~~ HOSPITAL LAB): SARS Coronavirus 2: NEGATIVE

## 2018-08-10 LAB — CBG MONITORING, ED: Glucose-Capillary: 101 mg/dL — ABNORMAL HIGH (ref 70–99)

## 2018-08-10 MED ORDER — PREGABALIN 50 MG PO CAPS
50.0000 mg | ORAL_CAPSULE | Freq: Three times a day (TID) | ORAL | Status: DC
  Administered 2018-08-10 – 2018-08-13 (×12): 50 mg via ORAL
  Filled 2018-08-10 (×12): qty 1

## 2018-08-10 MED ORDER — SODIUM CHLORIDE 0.9% IV SOLUTION: Freq: Once | INTRAVENOUS | Status: DC

## 2018-08-10 MED ORDER — SODIUM CHLORIDE 0.9 % IV SOLN
10.0000 mL/h | Freq: Once | INTRAVENOUS | Status: AC
  Administered 2018-08-10: 10 mL/h via INTRAVENOUS

## 2018-08-10 MED ORDER — INSULIN ASPART 100 UNIT/ML ~~LOC~~ SOLN
0.0000 [IU] | SUBCUTANEOUS | Status: DC
  Administered 2018-08-11 (×2): 1 [IU] via SUBCUTANEOUS

## 2018-08-10 MED ORDER — ONDANSETRON HCL 4 MG/2ML IJ SOLN: 4.0000 mg | Freq: Four times a day (QID) | INTRAMUSCULAR | Status: DC | PRN

## 2018-08-10 MED ORDER — LEVALBUTEROL HCL 0.63 MG/3ML IN NEBU
0.6300 mg | INHALATION_SOLUTION | Freq: Four times a day (QID) | RESPIRATORY_TRACT | Status: DC | PRN
  Administered 2018-08-10: 0.63 mg via RESPIRATORY_TRACT
  Filled 2018-08-10: qty 3

## 2018-08-10 MED ORDER — FUROSEMIDE 10 MG/ML IJ SOLN
20.0000 mg | Freq: Once | INTRAMUSCULAR | Status: AC
  Administered 2018-08-10: 20 mg via INTRAVENOUS
  Filled 2018-08-10: qty 2

## 2018-08-10 MED ORDER — METOPROLOL SUCCINATE ER 25 MG PO TB24: 25.0000 mg | ORAL_TABLET | Freq: Every day | ORAL | Status: DC

## 2018-08-10 MED ORDER — ACETAMINOPHEN 650 MG RE SUPP: 650.0000 mg | Freq: Four times a day (QID) | RECTAL | Status: DC | PRN

## 2018-08-10 MED ORDER — FLUTICASONE PROPIONATE 50 MCG/ACT NA SUSP
2.0000 | Freq: Every day | NASAL | Status: DC
  Administered 2018-08-10 – 2018-08-13 (×4): 2 via NASAL
  Filled 2018-08-10: qty 16

## 2018-08-10 MED ORDER — ROSUVASTATIN CALCIUM 20 MG PO TABS
20.0000 mg | ORAL_TABLET | Freq: Every day | ORAL | Status: DC
  Administered 2018-08-10 – 2018-08-13 (×4): 20 mg via ORAL
  Filled 2018-08-10 (×4): qty 1

## 2018-08-10 MED ORDER — NEFAZODONE HCL 100 MG PO TABS: 100.0000 mg | ORAL_TABLET | Freq: Two times a day (BID) | ORAL | Status: DC

## 2018-08-10 MED ORDER — ONDANSETRON HCL 4 MG PO TABS: 4.0000 mg | ORAL_TABLET | Freq: Four times a day (QID) | ORAL | Status: DC | PRN

## 2018-08-10 MED ORDER — CYCLOBENZAPRINE HCL 10 MG PO TABS
10.0000 mg | ORAL_TABLET | Freq: Three times a day (TID) | ORAL | Status: DC
  Administered 2018-08-10 – 2018-08-13 (×11): 10 mg via ORAL
  Filled 2018-08-10 (×11): qty 1

## 2018-08-10 MED ORDER — DILTIAZEM HCL 25 MG/5ML IV SOLN
10.0000 mg | Freq: Once | INTRAVENOUS | Status: AC
  Administered 2018-08-10: 10 mg via INTRAVENOUS
  Filled 2018-08-10: qty 5

## 2018-08-10 MED ORDER — ACETAMINOPHEN 325 MG PO TABS
650.0000 mg | ORAL_TABLET | Freq: Four times a day (QID) | ORAL | Status: DC | PRN
  Administered 2018-08-10 – 2018-08-11 (×2): 650 mg via ORAL
  Filled 2018-08-10 (×2): qty 2

## 2018-08-10 MED ORDER — VITAMIN D 25 MCG (1000 UNIT) PO TABS
5000.0000 [IU] | ORAL_TABLET | Freq: Every day | ORAL | Status: DC
  Administered 2018-08-10 – 2018-08-13 (×4): 5000 [IU] via ORAL
  Filled 2018-08-10 (×4): qty 5

## 2018-08-10 MED ORDER — ZOLPIDEM TARTRATE 5 MG PO TABS
10.0000 mg | ORAL_TABLET | Freq: Every day | ORAL | Status: DC
  Administered 2018-08-11 – 2018-08-12 (×2): 10 mg via ORAL
  Filled 2018-08-10 (×3): qty 2

## 2018-08-10 NOTE — Plan of Care (Signed)
  Problem: Education: Goal: Knowledge of General Education information will improve Description: Including pain rating scale, medication(s)/side effects and non-pharmacologic comfort measures Outcome: Progressing   Problem: Clinical Measurements: Goal: Ability to maintain clinical measurements within normal limits will improve Outcome: Progressing Goal: Will remain free from infection Outcome: Progressing Goal: Respiratory complications will improve Outcome: Progressing   Problem: Activity: Goal: Risk for activity intolerance will decrease Outcome: Progressing   Problem: Elimination: Goal: Will not experience complications related to bowel motility Outcome: Progressing Goal: Will not experience complications related to urinary retention Outcome: Progressing   Problem: Coping: Goal: Level of anxiety will decrease Outcome: Progressing   Problem: Pain Managment: Goal: General experience of comfort will improve Outcome: Progressing   Problem: Safety: Goal: Ability to remain free from injury will improve Outcome: Progressing   Problem: Skin Integrity: Goal: Risk for impaired skin integrity will decrease Outcome: Progressing

## 2018-08-10 NOTE — H&P (View-Only) (Signed)
[facility] Consult: 9:47 AM 08/10/2018  LOS: 0 days    Referring Provider: Dr Irene Pap  Primary Care Physician:  [name], MD Primary Gastroenterologist:  Dr. Bryan Lemma     Reason for Consultation:  Dark stools, Hgb 3.4   HPI: Justin [name] is a 63 y.o. male.  Hx Polycythemia vera, thrombocytosis.  Macrocytosis.  Thrombocytosis.  Followed by hematology and on chronic hydroxyurea.  DM 2.   Chronic Eliquis for atrial fib.  Chronic low dose ASA.  12/2017 bioprosthetic AVR and 4 V CABG. Fatty liver per CT of 2012, no abdominal imaging since.   Colonoscopy 2018 by Dr. Penelope Coop.  Patient reports benign polyps.  Do not have the report.    Telehealth visit with Dr. Bryan Lemma on 06/20/2022 history of intermittent occurrence of dark oral secretions staining his beard and pillowcase occurring since his open heart surgery.  No emesis, no nausea. + increase nocturnal regurge, belching.  + hoarseness No melena or hematochezia, abdominal pain or change in bowel habits.  Intermittent reflux symptoms treated with PRN OTC antacids. Hgb 10 to 11.  MCVs 100 to 109.  Iron studies unremarkable in 01/2018 As of the GI visit patient started on Protonix 40 mg twice daily.  MD held off on elective endoscopy given no red flag symptoms.  There was some suspicion that the dark secretions might be pulmonary in nature. Dr. Loletha Grayer mentioned patient was taking Plavix, however he is not, just the Eliquis and low-dose aspirin  Passing hard, formed but black stools for the past week.  Usually about 1 time a day.  Stools are definitely darker than previous.  Has not seen any blood in the stool.  He has been compliant with PPI, BID but is using omeprazole as it was the least expensive compared with Protonix.  The dark staining on his pillow  has become less frequent but has not completely resolved.  Starting over the weekend he developed dizziness, gait instability which progressed.  Yesterday he fell though this was not a syncopal spell.  He denies nausea, abdominal pain, vomiting. Presented to the ED.   heart rate initially in the 90s though it has gone up into the low 100s at times during this admission.  Blood pressures initially 120s over 40s to 50s.  Currently running 140s over 81s. He has been in bed since arrival but feels better this morning than he did when he came to the hospital  Hgb 4.2 >> 3.4.    MCV 127 INR 1.9  PPI drip started.  PRBC x 3/4 transfusing Last Eliquis was on 7/21 in AM.   Notes mention Plavix but he is not taking this medication Additional review of systems are that of some lower extremity edema and about a 8 pound weight gain last week but swelling has improved along with weight.  206# last Friday, 197# currently.  No abdominal swelling. Has never been advised he had abnormal liver tests or that he had a fatty liver Consumes maybe 2 beers a month.  No history of heavy alcohol use.  Past Medical History:  Diagnosis Date  . Acute meniscal tear of knee LEFT  . Aortic stenosis 12/01/2017   NONRHEUMATIC, AORTIC VALVE CALCIFICATIONS, MILD TO MODERATE REGURG, MILD TO MODERATE CALCIFIED ANNULUS per ECHO 10/25/17 @ [facility]  . Arthritis   . Atrial fibrillation ([place]) 11/12/2017   AT O/V WITH PCP  . DM (diabetes mellitus) ([place])   . Heart murmur MILD-- ASYMPTOMATIC  . Hyperlipidemia   . Hypertension   . Left knee pain   . PAD (peripheral artery disease) ([facility])    left leg claudication    Past Surgical History:  Procedure Laterality Date  . AORTIC VALVE REPLACEMENT N/A 01/03/2018   Procedure: AORTIC VALVE REPLACEMENT (AVR) using 64mm Magna Ease Bioprosthesis Aortic Valve;  Surgeon: Ivin Poot, MD;  Location: [place];  Service: Open Heart Surgery;  Laterality: N/A;  . APPENDECTOMY   1998  . CATARACT EXTRACTION W/ INTRAOCULAR LENS  IMPLANT, BILATERAL  1998/  2000  . CERVICAL FUSION  1985   C4 - 5  . CORONARY ARTERY BYPASS GRAFT N/A 01/03/2018   Procedure: CORONARY ARTERY BYPASS GRAFTING (CABG) x 4 (LIMA to LAD, SVG to DIAGONAL, SVG to RAMUS INTERMEDIATE, and SVG to PDA), USING LEFT INFTERNAL MAMMARY ARTERY AND;  Surgeon: Prescott Gum, Collier Salina, MD;  Location: [place];  Service: Open Heart Surgery;  Laterality: N/A;  . ENDARTERECTOMY Right 01/03/2018   Procedure: ENDARTERECTOMY CAROTID;  Surgeon: Marty Heck, MD;  Location: [facility];  Service: Vascular;  Laterality: Right;  . KNEE ARTHROSCOPY W/ MENISCECTOMY  1991   LEFT KNEE  . NASAL SINUS SURGERY  1982  . RIGHT/LEFT HEART CATH AND CORONARY ANGIOGRAPHY N/A 12/24/2017   Procedure: RIGHT/LEFT HEART CATH AND CORONARY ANGIOGRAPHY;  Surgeon: Jolaine Artist, MD;  Location: [facility];  Service: Cardiovascular;  Laterality: N/A;  . ROTATOR CUFF REPAIR  09-04-2005   LEFT SHOULDER  . TEE WITHOUT CARDIOVERSION N/A 12/24/2017   Procedure: TRANSESOPHAGEAL ECHOCARDIOGRAM (TEE);  Surgeon: Jolaine Artist, MD;  [facility];  Service: Cardiovascular;  Laterality: N/A;  . TEE WITHOUT CARDIOVERSION N/A 01/03/2018   Procedure: TRANSESOPHAGEAL ECHOCARDIOGRAM (TEE);  Surgeon: Prescott Gum, Collier Salina, MD;  Location: [place];  Service: Open Heart Surgery;  Laterality: N/A;  . TONSILLECTOMY  AGE 62    Prior to Admission medications   Medication Sig Start Date End Date Taking? Authorizing Provider  amoxicillin (AMOXIL) 500 MG capsule Take 1 capsule (500 mg total) by mouth once as needed for up to 4 doses. Take four 500 mg capsules by mouth 1 hour before dental visits 07/01/18  Yes Prescott Gum, Collier Salina, MD  apixaban (ELIQUIS) 5 MG TABS tablet Take 1 tablet (5 mg total) by mouth 2 (two) times daily. 01/11/18  Yes Lars Pinks M, PA-C  aspirin 81 MG tablet Take 81 mg by mouth daily.    Yes [provider]  Cholecalciferol  125 MCG (5000 UT) capsule Take 1 capsule by mouth daily.    Yes [provider]  cyclobenzaprine (FLEXERIL) 10 MG tablet Take 10 mg by mouth 3 (three) times daily.    Yes [provider]  empagliflozin (JARDIANCE) 10 MG TABS tablet Take 10 mg by mouth daily. 08/05/18  Yes Buford Dresser, MD  fluticasone (FLONASE) 50 MCG/ACT nasal spray Place 2 sprays into both nostrils daily.   Yes [provider]  furosemide (LASIX) 20 MG tablet Take 20 mg by mouth daily as needed for fluid.  04/17/18  Yes [provider]  hydroxyurea (HYDREA) 500 MG capsule TAKE 2 CAPSULES IN THE MORNING AND 1 CAPS AT NIGHT. MAY TAKE WITH FOOD TO MINIMIZE GI SIDE EFFECTS. Patient taking differently: Take 500 mg by mouth 3 (three) times daily.  07/22/18  Yes Tish Men, MD  loratadine (CLARITIN) 10 MG tablet Take 10 mg by mouth daily.   Yes [provider]  metFORMIN (GLUCOPHAGE-XR) 500 MG 24 hr tablet Take 500 mg by mouth daily before supper.   Yes [provider]  metoprolol succinate (TOPROL XL) 25 MG 24 hr tablet Take 1 tablet (25 mg total) by mouth daily. 02/09/18  Yes Buford Dresser, MD  nefazodone (SERZONE) 100 MG tablet Take 100 mg by mouth 2 (two) times daily.   Yes [provider]  omeprazole (PRILOSEC) 40 MG capsule Take 1 capsule (40 mg total) by mouth 2 (two) times a day. Patient taking differently: Take 40 mg by mouth daily.  06/30/18  Yes Cirigliano, Vito V, DO  POTASSIUM CHLORIDE PO Take 1 tablet by mouth daily as needed (when taking lasix).    Yes [provider]  pregabalin (LYRICA) 50 MG capsule Take 50 mg by mouth 3 (three) times daily.   Yes [provider]  rosuvastatin (CRESTOR) 20 MG tablet Take 1 tablet (20 mg total) by mouth daily. 02/09/18 02/04/19 Yes Buford Dresser, MD  zolpidem (AMBIEN) 10 MG tablet Take 10 mg by mouth at bedtime.    Yes [provider]    Scheduled Meds: . sodium chloride    Intravenous Once  . cholecalciferol  5,000 Units Oral Daily  . cyclobenzaprine  10 mg Oral TID  . fluticasone  2 spray Each Nare Daily  . insulin aspart  0-9 Units Subcutaneous Q4H  . pregabalin  50 mg Oral TID  . rosuvastatin  20 mg Oral Daily  . zolpidem  10 mg Oral QHS   Infusions: . pantoprozole (PROTONIX) infusion 8 mg/hr (08/10/18 0704)   PRN Meds: acetaminophen **OR** acetaminophen, ondansetron **OR** ondansetron (ZOFRAN) IV   Allergies as of 08/09/2018 - Review Complete 08/09/2018  Allergen Reaction Noted  . Codeine Swelling 10/15/2011    Family History  Adopted: Yes  Problem Relation Age of Onset  . Bladder Cancer Father        adopted father    Social History   Socioeconomic History  . Marital status: Married    Spouse name: Not on file  . Number of children: Not on file  . Years of education: Not on file  . Highest education level: Not on file  Occupational History  . Not on file  Social Needs  . Financial resource strain: Not on file  . Food insecurity    Worry: Not on file    Inability: Not on file  . Transportation needs    Medical: Not on file    Non-medical: Not on file  Tobacco Use  . Smoking status: Former Smoker    Packs/day: 0.25    Years: 23.00    Pack years: 5.75    Types: Cigars    Quit date: 01/02/2018    Years since quitting: 0.6  . Smokeless tobacco: Never Used  . Tobacco comment: quit cigs in 2011 and smokes cigars daily- from 3-10 cigars-09/27/13  Substance and Sexual Activity  . Alcohol use: Not Currently    Alcohol/week: 0.0 standard drinks    Comment: not now  . Drug use: No  . Sexual activity: Not on file  Lifestyle  . Physical activity  Days per week: Not on file    Minutes per session: Not on file  . Stress: Not on file  Relationships  . Social Herbalist on phone: Not on file    Gets together: Not on file    Attends religious service: Not on file    Active member of club or organization: Not on file     Attends meetings of clubs or organizations: Not on file    Relationship status: Not on file  . Intimate partner violence    Fear of current or ex partner: Not on file    Emotionally abused: Not on file    Physically abused: Not on file    Forced sexual activity: Not on file  Other Topics Concern  . Not on file  Social History Narrative  . Not on file    REVIEW OF SYSTEMS: Constitutional: Per HPI.  Weakness, unsteady gait. ENT:  No nose bleeds Pulm: No shortness of breath or cough. CV:  No palpitations, no LE edema.  No angina, chest pressure. GU:  No hematuria, no frequency GI: No dysphagia.  See HPI. Heme: Other than the formed, black stools.  There is been no excessive bleeding or bruising or signs of bleeding. Transfusions: See HPI.  No prior transfusions. Neuro: Dizziness as per HPI.  No headaches, no peripheral tingling or numbness Derm:  No itching, no rash or sores.  Endocrine:  No sweats or chills.  No polyuria or dysuria Immunization: Not queried. Travel:  None beyond local counties in last few months.    PHYSICAL EXAM: Vital signs in last 24 hours: Vitals:   08/10/18 0911 08/10/18 0914  BP: (!) 145/55 (!) 145/55  Pulse: (!) 110 (!) 105  Resp: (!) 25 (!) 33  Temp: 98.2 F (36.8 C)   SpO2: 98% 96%   Wt Readings from Last 3 Encounters:  08/09/18 89.8 kg  08/05/18 93.4 kg  07/19/18 91.2 kg   General: Pleasant, overweight, pale.  Does not look acutely ill. Head: No facial asymmetry or swelling.  No signs of head trauma. Eyes: No scleral icterus. Ears: Not HOH. Nose: No congestion or discharge. Mouth: Oral mucosa pink, moist, clear.  Tongue midline.  Fair dentition. Neck: No JVD, no masses, no thyromegaly. Lungs: Clear bilaterally.  No labored breathing or cough. Heart: Irregularly irregular, rate not accelerated or slow.  No MRG.  S1, S2 present Abdomen: Soft.  Not tender or distended.  Bowel sounds active.  No HSM, masses, bruits, hernias..   Rectal:  Deferred.  Stool submitted from ED late yesterday is FOBT positive. Musc/Skeltl: No joint redness, swelling or gross deformity.  No muscular atrophy. Extremities: No CCE. Neurologic: Alert.  Oriented x3.  Moves all 4 limbs without obvious weakness.  No tremors. Skin: No rashes or sores. Tattoos: Tattoos on his arms, professional quality. Nodes: No cervical adenopathy. Psych: Calm, cooperative, fluid speech.  Intake/Output from previous day: 07/21 0701 - 07/22 0700 In: 1127.1 [I.V.:500; Blood:627.1] Out: 100 [Urine:100] Intake/Output this shift: Total I/O In: 537.9 [P.O.:240; Blood:297.9] Out: -   LAB RESULTS: Recent Labs    08/09/18 2342 08/10/18 0140  WBC 15.6* 17.2*  HGB 4.2* 3.4*  HCT 13.6* 11.1*  PLT 142* 154   BMET Lab Results  Component Value Date   NA 136 08/10/2018   NA 137 08/09/2018   NA 135 07/19/2018   K 4.4 08/10/2018   K 4.3 08/09/2018   K 4.2 07/19/2018   CL 107 08/10/2018  CL 108 08/09/2018   CL 99 07/19/2018   CO2 19 (L) 08/10/2018   CO2 18 (L) 08/09/2018   CO2 27 07/19/2018   GLUCOSE 118 (H) 08/10/2018   GLUCOSE 129 (H) 08/09/2018   GLUCOSE 109 (H) 07/19/2018   BUN 24 (H) 08/10/2018   BUN 24 (H) 08/09/2018   BUN 12 07/19/2018   CREATININE 0.99 08/10/2018   CREATININE 0.99 08/09/2018   CREATININE 0.80 07/19/2018   CALCIUM 8.3 (L) 08/10/2018   CALCIUM 8.3 (L) 08/09/2018   CALCIUM 9.8 07/19/2018   LFT Recent Labs    08/09/18 2342 08/10/18 0140  PROT 5.4* 5.2*  ALBUMIN 2.8* 2.7*  AST 84* 120*  ALT 55* 81*  ALKPHOS 57 64  BILITOT 0.8 0.9   PT/INR Lab Results  Component Value Date   INR 1.9 (H) 08/10/2018   INR 1.00 01/03/2018   INR 1.13 12/30/2017   Hepatitis Panel No results for input(s): HEPBSAG, HCVAB, HEPAIGM, HEPBIGM in the last 72 hours. C-Diff No components found for: CDIFF Lipase  No results found for: LIPASE  Drugs of Abuse  No results found for: LABOPIA, COCAINSCRNUR, LABBENZ, AMPHETMU, THCU, LABBARB    RADIOLOGY STUDIES: Dg Chest Portable 1 View  Result Date: 08/09/2018 CLINICAL DATA:  Recent syncopal episode EXAM: PORTABLE CHEST 1 VIEW COMPARISON:  02/15/2018 FINDINGS: Cardiac shadow is enlarged. Postsurgical changes are seen. The lungs are well aerated bilaterally. No focal infiltrate or sizable effusion is noted. No bony abnormality is seen. IMPRESSION: No acute abnormality noted. Electronically Signed   By: Inez Catalina M.D.   On: 08/09/2018 23:31     IMPRESSION:   *      FOBT positive, tarry stools. R/o PUD vs AVMs.   Fatty liver 16 yr ago raises specter of cirrhosis, portal gastropathy,   *    Acute, severe macrocytic anemia. 3rd of 4 U PRBCs currently transfusing  *    Chronic Eliquis for A. fib. 12/2017 bioprosthetic AVR replacement  *      Leukocytosis.  *    Polycythemia Vera.  Followed by Dr Maylon Peppers.  On Hydroxyurea.  Phlebotomy in 12/2017.     PLAN:     *   EGD set for 0815 tmrw.  Clears today CBC and pt/INR this afternoon after all 4 PRBCs finish.   Ordered abdominal ultrasound to reassess fatty liver.     Azucena Freed  08/10/2018, 9:47 AM Phone [phone]   Attending physician's note   I have taken a history, examined the patient and reviewed the chart. I agree with the Advanced Practitioner's note, impression and recommendations.  63 year old male with history of thrombocytosis, polycythemia vera, CAD status post CABG, bioprosthetic AVR on low-dose aspirin and Eliquis.  Hemoglobin 3.4 on admission, receiving 4th unit PRBC, repeat hemoglobin pending INR 1.9 Last dose of Eliquis yesterday morning  Currently hemodynamically stable  Will plan for EGD once he is adequately resuscitated with hemoglobin greater than 7  Continue to hold Eliquis  Repeat PT/INR tomorrow a.m., if greater than 1.5 with evidence of active GI bleed, will consider FFP infusion  The risks and benefits as well as alternatives of endoscopic procedure(s) have been discussed and  reviewed. All questions answered. The patient agrees to proceed.    Damaris Hippo , MD [phone]

## 2018-08-10 NOTE — ED Notes (Addendum)
Upon standing for orthostatics PT was unsteady on his feet PT had to be maximum assisted to finish standing on his feet for orthostatics to finish.

## 2018-08-10 NOTE — H&P (Addendum)
History and Physical    Justin [name] ZOX:[id number] DOB: [birth year] DOA: 08/09/2018  PCP: [name], MD  Patient coming from: Home  I have personally briefly reviewed patient's old medical records in [place]  Chief Complaint: Near syncope  HPI: Justin [name] is a 63 y.o. male with medical history significant of AS, A.Fib on eliquis, DM2, HTN, CAD s/p CABG.  Patient presents to ED via EMS for near syncope.  Has been intermittently lightheaded and dizzy for past few days, especially with position change and ambulation.  This evening felt like he may pass out.  911 called.  Per EMS patient 81X systolic, given 914 cc IVF en route with improvement.  No NSAID use.  Did have coffee ground emesis last month but GI thought this was just reflux and his HGB seemed stable as of end of June (11.2).  Also seemed to have been improving on PPI.  Has had intermittent dark tarry stool.  No fevers, chills, abd pain.   ED Course: HGB 4.2 down from 11.2 at end of June.  Baseline runs about 11.  He is also on chronic hydrea for treatment of PCV.  Hemoccult positive.  2u PRBC transfusion ordered  INR 1.9.  No active bleeding evidence thus far during stay in ED.   Review of Systems: As per HPI, otherwise all review of systems negative.  Past Medical History:  Diagnosis Date   Acute meniscal tear of knee LEFT   Aortic stenosis 12/01/2017   NONRHEUMATIC, AORTIC VALVE CALCIFICATIONS, MILD TO MODERATE REGURG, MILD TO MODERATE CALCIFIED ANNULUS per ECHO 10/25/17 @ [facility] [address]   Arthritis    Atrial fibrillation ([place]) 11/12/2017   AT O/V WITH PCP   DM (diabetes mellitus) ([place])    Heart murmur MILD-- ASYMPTOMATIC   Hyperlipidemia    Hypertension    Left knee pain    PAD (peripheral artery disease) (HCC)    left leg claudication    Past Surgical History:  Procedure Laterality Date   AORTIC VALVE REPLACEMENT N/A 01/03/2018   Procedure: AORTIC VALVE  REPLACEMENT (AVR) using 39mm Magna Ease Bioprosthesis Aortic Valve;  Surgeon: Ivin Poot, MD;  Location: [place];  Service: Open Heart Surgery;  Laterality: N/A;   APPENDECTOMY  1998   CATARACT EXTRACTION W/ INTRAOCULAR LENS  IMPLANT, BILATERAL  1998/  2000   CERVICAL FUSION  1985   C4 - 5   CORONARY ARTERY BYPASS GRAFT N/A 01/03/2018   Procedure: CORONARY ARTERY BYPASS GRAFTING (CABG) x 4 (LIMA to LAD, SVG to DIAGONAL, SVG to RAMUS INTERMEDIATE, and SVG to PDA), USING LEFT INFTERNAL MAMMARY ARTERY AND;  Surgeon: Prescott Gum, Collier Salina, MD;  Location: [place];  Service: Open Heart Surgery;  Laterality: N/A;   ENDARTERECTOMY Right 01/03/2018   Procedure: ENDARTERECTOMY CAROTID;  Surgeon: Marty Heck, MD;  Location: [place];  Service: Vascular;  Laterality: Right;   KNEE ARTHROSCOPY [place]   RIGHT/LEFT HEART CATH AND CORONARY ANGIOGRAPHY N/A 12/24/2017   Procedure: RIGHT/LEFT HEART CATH AND CORONARY ANGIOGRAPHY;  Surgeon: Jolaine Artist, MD;  Location: [place] [facility];  Service: Cardiovascular;  Laterality: N/A;   ROTATOR CUFF REPAIR  09-04-2005   LEFT SHOULDER   TEE WITHOUT CARDIOVERSION N/A 12/24/2017   Procedure: TRANSESOPHAGEAL ECHOCARDIOGRAM (TEE);  Surgeon: Jolaine Artist, MD;  Location: [facility];  Service: Cardiovascular;  Laterality: N/A;   TEE WITHOUT CARDIOVERSION N/A 01/03/2018  Procedure: TRANSESOPHAGEAL ECHOCARDIOGRAM (TEE);  Surgeon: Prescott Gum, Collier Salina, MD;  Location: [place];  Service: Open Heart Surgery;  Laterality: N/A;   TONSILLECTOMY  AGE 69     reports that he quit smoking about 7 months ago. His smoking use included cigars. He has a 5.75 pack-year smoking history. He has never used smokeless tobacco. He reports previous alcohol use. He reports that he does not use drugs.  Allergies  Allergen Reactions   Codeine Swelling    SWELLING REACTION UNSPECIFIED  Tolerates hydrocodone and  oxycodone     Family History  Adopted: Yes  Problem Relation Age of Onset   Bladder Cancer Father        adopted father     Prior to Admission medications   Medication Sig Start Date End Date Taking? Authorizing Provider  amoxicillin (AMOXIL) 500 MG capsule Take 1 capsule (500 mg total) by mouth once as needed for up to 4 doses. Take four 500 mg capsules by mouth 1 hour before dental visits 07/01/18  Yes Prescott Gum, Collier Salina, MD  apixaban (ELIQUIS) 5 MG TABS tablet Take 1 tablet (5 mg total) by mouth 2 (two) times daily. 01/11/18  Yes Lars Pinks M, PA-C  aspirin 81 MG tablet Take 81 mg by mouth daily.    Yes [provider]  Cholecalciferol 125 MCG (5000 UT) capsule Take 1 capsule by mouth daily.    Yes [provider]  cyclobenzaprine (FLEXERIL) 10 MG tablet Take 10 mg by mouth 3 (three) times daily.    Yes [provider]  empagliflozin (JARDIANCE) 10 MG TABS tablet Take 10 mg by mouth daily. 08/05/18  Yes Buford Dresser, MD  fluticasone (FLONASE) 50 MCG/ACT nasal spray Place 2 sprays into both nostrils daily.   Yes [provider]  furosemide (LASIX) 20 MG tablet Take 20 mg by mouth daily as needed for fluid.  04/17/18  Yes [provider]  hydroxyurea (HYDREA) 500 MG capsule TAKE 2 CAPSULES IN THE MORNING AND 1 CAPS AT NIGHT. MAY TAKE WITH FOOD TO MINIMIZE GI SIDE EFFECTS. Patient taking differently: Take 500 mg by mouth 3 (three) times daily.  07/22/18  Yes Tish Men, MD  loratadine (CLARITIN) 10 MG tablet Take 10 mg by mouth daily.   Yes [provider]  metFORMIN (GLUCOPHAGE-XR) 500 MG 24 hr tablet Take 500 mg by mouth daily before supper.   Yes [provider]  metoprolol succinate (TOPROL XL) 25 MG 24 hr tablet Take 1 tablet (25 mg total) by mouth daily. 02/09/18  Yes Buford Dresser, MD  nefazodone (SERZONE) 100 MG tablet Take 100 mg by mouth 2 (two) times daily.   Yes [provider]    omeprazole (PRILOSEC) 40 MG capsule Take 1 capsule (40 mg total) by mouth 2 (two) times a day. Patient taking differently: Take 40 mg by mouth daily.  06/30/18  Yes Cirigliano, Vito V, DO  POTASSIUM CHLORIDE PO Take 1 tablet by mouth daily as needed (when taking lasix).    Yes [provider]  pregabalin (LYRICA) 50 MG capsule Take 50 mg by mouth 3 (three) times daily.   Yes [provider]  rosuvastatin (CRESTOR) 20 MG tablet Take 1 tablet (20 mg total) by mouth daily. 02/09/18 02/04/19 Yes Buford Dresser, MD  zolpidem (AMBIEN) 10 MG tablet Take 10 mg by mouth at bedtime.    Yes [provider]    Physical Exam: Vitals:   08/09/18 2315 08/09/18 2330 08/09/18 2345 08/10/18 0000  BP: (!) 126/53   (!) 119/55  Pulse:  89 93   Resp: (!) 26 17 (!) 24 (!) 23  Temp:      TempSrc:      SpO2:  100% 100%   Weight:      Height:        Constitutional: NAD, calm, comfortable Eyes: PERRL, lids and conjunctivae normal ENMT: Mucous membranes are moist. Posterior pharynx clear of any exudate or lesions.Normal dentition.  Neck: normal, supple, no masses, no thyromegaly Respiratory: clear to auscultation bilaterally, no wheezing, no crackles. Normal respiratory effort. No accessory muscle use.  Cardiovascular: Regular rate and rhythm, no murmurs / rubs / gallops. No extremity edema. 2+ pedal pulses. No carotid bruits.  Abdomen: no tenderness, no masses palpated. No hepatosplenomegaly. Bowel sounds positive.  Musculoskeletal: no clubbing / cyanosis. No joint deformity upper and lower extremities. Good ROM, no contractures. Normal muscle tone.  Skin: no rashes, lesions, ulcers. No induration Neurologic: CN 2-12 grossly intact. Sensation intact, DTR normal. Strength 5/5 in all 4.  Psychiatric: Normal judgment and insight. Alert and oriented x 3. Normal mood.    Labs on Admission: I have personally reviewed following labs and imaging studies  CBC: Recent Labs  Lab  08/09/18 2342  WBC 15.6*  HGB 4.2*  HCT 13.6*  MCV 127.1*  PLT 644*   Basic Metabolic Panel: Recent Labs  Lab 08/09/18 2342  NA 137  K 4.3  CL 108  CO2 18*  GLUCOSE 129*  BUN 24*  CREATININE 0.99  CALCIUM 8.3*   GFR: Estimated Creatinine Clearance: 82.1 mL/min (by C-G formula based on SCr of 0.99 mg/dL). Liver Function Tests: Recent Labs  Lab 08/09/18 2342  AST 84*  ALT 55*  ALKPHOS 57  BILITOT 0.8  PROT 5.4*  ALBUMIN 2.8*   No results for input(s): LIPASE, AMYLASE in the last 168 hours. No results for input(s): AMMONIA in the last 168 hours. Coagulation Profile: Recent Labs  Lab 08/10/18 0014  INR 1.9*   Cardiac Enzymes: No results for input(s): CKTOTAL, CKMB, CKMBINDEX, TROPONINI in the last 168 hours. BNP (last 3 results) No results for input(s): PROBNP in the last 8760 hours. HbA1C: No results for input(s): HGBA1C in the last 72 hours. CBG: Recent Labs  Lab 08/09/18 2256  GLUCAP 103*   Lipid Profile: No results for input(s): CHOL, HDL, LDLCALC, TRIG, CHOLHDL, LDLDIRECT in the last 72 hours. Thyroid Function Tests: No results for input(s): TSH, T4TOTAL, FREET4, T3FREE, THYROIDAB in the last 72 hours. Anemia Panel: No results for input(s): VITAMINB12, FOLATE, FERRITIN, TIBC, IRON, RETICCTPCT in the last 72 hours. Urine analysis:    Component Value Date/Time   COLORURINE YELLOW 12/30/2017 1112   APPEARANCEUR CLEAR 12/30/2017 1112   LABSPEC 1.010 12/30/2017 1112   PHURINE 6.0 12/30/2017 1112   GLUCOSEU NEGATIVE 12/30/2017 1112   HGBUR NEGATIVE 12/30/2017 1112   BILIRUBINUR NEGATIVE 12/30/2017 1112   KETONESUR NEGATIVE 12/30/2017 1112   PROTEINUR NEGATIVE 12/30/2017 1112   NITRITE NEGATIVE 12/30/2017 1112   LEUKOCYTESUR NEGATIVE 12/30/2017 1112    Radiological Exams on Admission: Dg Chest Portable 1 View  Result Date: 08/09/2018 CLINICAL DATA:  Recent syncopal episode EXAM: PORTABLE CHEST 1 VIEW COMPARISON:  02/15/2018 FINDINGS: Cardiac  shadow is enlarged. Postsurgical changes are seen. The lungs are well aerated bilaterally. No focal infiltrate or sizable effusion is noted. No bony abnormality is seen. IMPRESSION: No acute abnormality noted. Electronically Signed   By: Inez Catalina M.D.   On: 08/09/2018 23:31  EKG: Independently reviewed.  Assessment/Plan Principal Problem:   GI bleed Active Problems:   Essential hypertension   Atrial fibrillation (HCC)   Polycythemia vera (HCC)   Symptomatic anemia   Acute blood loss anemia    1. GIB with acute blood loss anemia / symptomatic anemia - 1. Transfusing 2u PRBC for HGB 4.2 2. Repeat CBC in AM 3. Call GI in AM 4. PPI gtt 5. Hemoccult positive, but no obvious large volume acute GIB tonight 1. Holding eliquis, but dont see need for emergency reversal agent at the moment as a result 6. Clear liquid diet 2. A.Fib - 1. Hold eliquis 2. Hold metoprolol 3. LFT elevation - 1. Hold nefazodone for the moment, though isnt clear that this is the cause 2. Repeat CMP in AM 4. HTN - 1. Holding metoprolol, hypotensive on EMS presentation 5. PCV - holding hydrea for the moment 6. DM2 - 1. Hold home meds 2. Sensitive SSI Q4H  DVT prophylaxis: SCDs Code Status: Full Family Communication: No family in room Disposition Plan: Home after admit Consults called: None, call GI in AM Admission status: Admit to inpatient  Severity of Illness: The appropriate patient status for this patient is INPATIENT. Inpatient status is judged to be reasonable and necessary in order to provide the required intensity of service to ensure the patient's safety. The patient's presenting symptoms, physical exam findings, and initial radiographic and laboratory data in the context of their chronic comorbidities is felt to place them at high risk for further clinical deterioration. Furthermore, it is not anticipated that the patient will be medically stable for discharge from the hospital within 2  midnights of admission. The following factors support the patient status of inpatient.   IP for GIB with HGB 4.2 requiring PRBC transfusions, likely requiring endoscopy.   * I certify that at the point of admission it is my clinical judgment that the patient will require inpatient hospital care spanning beyond 2 midnights from the point of admission due to high intensity of service, high risk for further deterioration and high frequency of surveillance required.*    Shone Leventhal M. DO [facility]  How to contact the [facility] Attending or Consulting provider [place] or covering provider during after hours [place], for this patient?  1. Check the care team in [facility] and look for a) attending/consulting [facility] provider listed and b) the [facility] team listed 2. Log into [email or web address]  Amion Physician Scheduling and messaging for groups and whole hospitals  On call and physician scheduling software for group practices, residents, hospitalists and other medical providers for call, clinic, rotation and shift schedules. On[employer] is a hospital-wide system for scheduling doctors and paging doctors on call. EasyPlot is for scientific plotting and data analysis.  [email or web address]  and use [place] universal password to access. If you do not have the password, please contact the hospital operator.  3. Locate the [facility] provider you are looking for under [facility] and page to a number that you can be directly reached. 4. If you still have difficulty reaching the provider, please page the [facility] (Director on Call) for the Hospitalists listed on amion for assistance.  08/10/2018, 1:01 AM

## 2018-08-10 NOTE — Progress Notes (Signed)
Upon shift assessment patient stated feeling short of breath and feeling palpitations. Patients also stated feeling anxious because of everything going on with his health. Patient placed on 2L of 02 sat 100%, RR 34-36, HR 110S-120S AFIB, BP 171/82. Paged [facility], got orders for CXR, iv Cardizem, breathing treatment, EKG, HH blood work. Will continue to monitor patient closely.

## 2018-08-10 NOTE — Progress Notes (Signed)
Justin [name] is a 63 y.o. male with medical history significant of AS, A.Fib on eliquis, DM2, HTN, CAD s/p CABG.  Patient presents to ED via EMS for near syncope.  Has been intermittently lightheaded and dizzy for past few days, especially with position change and ambulation.  This evening felt like he may pass out.  911 called.  Per EMS patient 42H systolic, given 062 cc IVF en route with improvement.  No NSAID use.  Did have coffee ground emesis last month but GI thought this was just reflux and his HGB seemed stable as of end of June (11.2).  Also seemed to have been improving on PPI.  Has had intermittent dark tarry stool.  No fevers, chills, abd pain.  ED Course: HGB 4.2 down from 11.2 at end of June.  Baseline runs about 11.  He is also on chronic hydrea for treatment of PCV.  Hemoccult positive.  08/10/18: Patient was seen and examined at his bedside this morning.  Reports last bowel movement was yesterday morning formed and black stools.  Denies any abdominal pain or nausea.  Hemoglobin dropped to 3.4 this morning added 2 unit PRBC for total 4 unit PRBC transfusion.  Currently on Protonix drip.  GI has been consulted to further assess and will see in consultation.  Highly appreciated.  Please refer to H&P dictated by Dr. Alcario Drought on 08/10/2018 for further details of the assessment and plan.

## 2018-08-10 NOTE — Consult Note (Addendum)
[facility] Consult: 9:47 AM 08/10/2018  LOS: 0 days    Referring Provider: Dr Irene Pap  Primary Care Physician:  [name], MD Primary Gastroenterologist:  Dr. Bryan Lemma     Reason for Consultation:  Dark stools, Hgb 3.4   HPI: Milind Raether is a 63 y.o. male.  Hx Polycythemia vera, thrombocytosis.  Macrocytosis.  Thrombocytosis.  Followed by hematology and on chronic hydroxyurea.  DM 2.   Chronic Eliquis for atrial fib.  Chronic low dose ASA.  12/2017 bioprosthetic AVR and 4 V CABG. Fatty liver per CT of 2012, no abdominal imaging since.   Colonoscopy 2018 by Dr. Penelope Coop.  Patient reports benign polyps.  Do not have the report.    Telehealth visit with Dr. Bryan Lemma on 06/20/2022 history of intermittent occurrence of dark oral secretions staining his beard and pillowcase occurring since his open heart surgery.  No emesis, no nausea. + increase nocturnal regurge, belching.  + hoarseness No melena or hematochezia, abdominal pain or change in bowel habits.  Intermittent reflux symptoms treated with PRN OTC antacids. Hgb 10 to 11.  MCVs 100 to 109.  Iron studies unremarkable in 01/2018 As of the GI visit patient started on Protonix 40 mg twice daily.  MD held off on elective endoscopy given no red flag symptoms.  There was some suspicion that the dark secretions might be pulmonary in nature. Dr. Loletha Grayer mentioned patient was taking Plavix, however he is not, just the Eliquis and low-dose aspirin  Passing hard, formed but black stools for the past week.  Usually about 1 time a day.  Stools are definitely darker than previous.  Has not seen any blood in the stool.  He has been compliant with PPI, BID but is using omeprazole as it was the least expensive compared with Protonix.  The dark staining on his pillow  has become less frequent but has not completely resolved.  Starting over the weekend he developed dizziness, gait instability which progressed.  Yesterday he fell though this was not a syncopal spell.  He denies nausea, abdominal pain, vomiting. Presented to the ED.   heart rate initially in the 90s though it has gone up into the low 100s at times during this admission.  Blood pressures initially 120s over 40s to 50s.  Currently running 140s over 38s. He has been in bed since arrival but feels better this morning than he did when he came to the hospital  Hgb 4.2 >> 3.4.    MCV 127 INR 1.9  PPI drip started.  PRBC x 3/4 transfusing Last Eliquis was on 7/21 in AM.   Notes mention Plavix but he is not taking this medication Additional review of systems are that of some lower extremity edema and about a 8 pound weight gain last week but swelling has improved along with weight.  206# last Friday, 197# currently.  No abdominal swelling. Has never been advised he had abnormal liver tests or that he had a fatty liver Consumes maybe 2 beers a month.  No history of heavy alcohol use.  Past Medical History:  Diagnosis Date  . Acute meniscal tear of knee LEFT  . Aortic stenosis 12/01/2017   NONRHEUMATIC, AORTIC VALVE CALCIFICATIONS, MILD TO MODERATE REGURG, MILD TO MODERATE CALCIFIED ANNULUS per ECHO 10/25/17 @ [facility]  . Arthritis   . Atrial fibrillation ([place]) 11/12/2017   AT O/V WITH PCP  . DM (diabetes mellitus) ([place])   . Heart murmur MILD-- ASYMPTOMATIC  . Hyperlipidemia   . Hypertension   . Left knee pain   . PAD (peripheral artery disease) ([facility])    left leg claudication    Past Surgical History:  Procedure Laterality Date  . AORTIC VALVE REPLACEMENT N/A 01/03/2018   Procedure: AORTIC VALVE REPLACEMENT (AVR) using 69mm Magna Ease Bioprosthesis Aortic Valve;  Surgeon: Ivin Poot, MD;  Location: [facility];  Service: Open Heart Surgery;  Laterality: N/A;  . APPENDECTOMY   1998  . CATARACT EXTRACTION W/ INTRAOCULAR LENS  IMPLANT, BILATERAL  1998/  2000  . CERVICAL FUSION  1985   C4 - 5  . CORONARY ARTERY BYPASS GRAFT N/A 01/03/2018   Procedure: CORONARY ARTERY BYPASS GRAFTING (CABG) x 4 (LIMA to LAD, SVG to DIAGONAL, SVG to RAMUS INTERMEDIATE, and SVG to PDA), USING LEFT INFTERNAL MAMMARY ARTERY AND;  Surgeon: Prescott Gum, Collier Salina, MD;  Location: [facility];  Service: Open Heart Surgery;  Laterality: N/A;  . ENDARTERECTOMY Right 01/03/2018   Procedure: ENDARTERECTOMY CAROTID;  Surgeon: Marty Heck, MD;  Location: [facility];  Service: Vascular;  Laterality: Right;  . KNEE ARTHROSCOPY W/ MENISCECTOMY  1991   LEFT KNEE  . NASAL SINUS SURGERY  1982  . RIGHT/LEFT HEART CATH AND CORONARY ANGIOGRAPHY N/A 12/24/2017   Procedure: RIGHT/LEFT HEART CATH AND CORONARY ANGIOGRAPHY;  Surgeon: Jolaine Artist, MD;  Location: [facility];  Service: Cardiovascular;  Laterality: N/A;  . ROTATOR CUFF REPAIR  09-04-2005   LEFT SHOULDER  . TEE WITHOUT CARDIOVERSION N/A 12/24/2017   Procedure: TRANSESOPHAGEAL ECHOCARDIOGRAM (TEE);  Surgeon: Jolaine Artist, MD;  Location: [facility];  Service: Cardiovascular;  Laterality: N/A;  . TEE WITHOUT CARDIOVERSION N/A 01/03/2018   Procedure: TRANSESOPHAGEAL ECHOCARDIOGRAM (TEE);  Surgeon: Prescott Gum, Collier Salina, MD;  Location: [facility];  Service: Open Heart Surgery;  Laterality: N/A;  . TONSILLECTOMY  AGE 55    Prior to Admission medications   Medication Sig Start Date End Date Taking? Authorizing Provider  amoxicillin (AMOXIL) 500 MG capsule Take 1 capsule (500 mg total) by mouth once as needed for up to 4 doses. Take four 500 mg capsules by mouth 1 hour before dental visits 07/01/18  Yes Prescott Gum, Collier Salina, MD  apixaban (ELIQUIS) 5 MG TABS tablet Take 1 tablet (5 mg total) by mouth 2 (two) times daily. 01/11/18  Yes Lars Pinks M, PA-C  aspirin 81 MG tablet Take 81 mg by mouth daily.    Yes [provider]  Cholecalciferol  125 MCG (5000 UT) capsule Take 1 capsule by mouth daily.    Yes [provider]  cyclobenzaprine (FLEXERIL) 10 MG tablet Take 10 mg by mouth 3 (three) times daily.    Yes [provider]  empagliflozin (JARDIANCE) 10 MG TABS tablet Take 10 mg by mouth daily. 08/05/18  Yes Buford Dresser, MD  fluticasone (FLONASE) 50 MCG/ACT nasal spray Place 2 sprays into both nostrils daily.   Yes [provider]  furosemide (LASIX) 20 MG tablet Take 20 mg by mouth daily as needed for fluid.  04/17/18  Yes [provider]  hydroxyurea (HYDREA) 500 MG capsule TAKE 2 CAPSULES IN THE MORNING AND 1 CAPS AT NIGHT. MAY TAKE WITH FOOD TO MINIMIZE GI SIDE EFFECTS. Patient taking differently: Take 500 mg by mouth 3 (three) times daily.  07/22/18  Yes Tish Men, MD  loratadine (CLARITIN) 10 MG tablet Take 10 mg by mouth daily.   Yes [provider]  metFORMIN (GLUCOPHAGE-XR) 500 MG 24 hr tablet Take 500 mg by mouth daily before supper.   Yes [provider]  metoprolol succinate (TOPROL XL) 25 MG 24 hr tablet Take 1 tablet (25 mg total) by mouth daily. 02/09/18  Yes Buford Dresser, MD  nefazodone (SERZONE) 100 MG tablet Take 100 mg by mouth 2 (two) times daily.   Yes [provider]  omeprazole (PRILOSEC) 40 MG capsule Take 1 capsule (40 mg total) by mouth 2 (two) times a day. Patient taking differently: Take 40 mg by mouth daily.  06/30/18  Yes Cirigliano, Vito V, DO  POTASSIUM CHLORIDE PO Take 1 tablet by mouth daily as needed (when taking lasix).    Yes [provider]  pregabalin (LYRICA) 50 MG capsule Take 50 mg by mouth 3 (three) times daily.   Yes [provider]  rosuvastatin (CRESTOR) 20 MG tablet Take 1 tablet (20 mg total) by mouth daily. 02/09/18 02/04/19 Yes Buford Dresser, MD  zolpidem (AMBIEN) 10 MG tablet Take 10 mg by mouth at bedtime.    Yes [provider]    Scheduled Meds: . sodium chloride    Intravenous Once  . cholecalciferol  5,000 Units Oral Daily  . cyclobenzaprine  10 mg Oral TID  . fluticasone  2 spray Each Nare Daily  . insulin aspart  0-9 Units Subcutaneous Q4H  . pregabalin  50 mg Oral TID  . rosuvastatin  20 mg Oral Daily  . zolpidem  10 mg Oral QHS   Infusions: . pantoprozole (PROTONIX) infusion 8 mg/hr (08/10/18 0704)   PRN Meds: acetaminophen **OR** acetaminophen, ondansetron **OR** ondansetron (ZOFRAN) IV   Allergies as of 08/09/2018 - Review Complete 08/09/2018  Allergen Reaction Noted  . Codeine Swelling 10/15/2011    Family History  Adopted: Yes  Problem Relation Age of Onset  . Bladder Cancer Father        adopted father    Social History   Socioeconomic History  . Marital status: Married    Spouse name: Not on file  . Number of children: Not on file  . Years of education: Not on file  . Highest education level: Not on file  Occupational History  . Not on file  Social Needs  . Financial resource strain: Not on file  . Food insecurity    Worry: Not on file    Inability: Not on file  . Transportation needs    Medical: Not on file    Non-medical: Not on file  Tobacco Use  . Smoking status: Former Smoker    Packs/day: 0.25    Years: 23.00    Pack years: 5.75    Types: Cigars    Quit date: 01/02/2018    Years since quitting: 0.6  . Smokeless tobacco: Never Used  . Tobacco comment: quit cigs in 2011 and smokes cigars daily- from 3-10 cigars-09/27/13  Substance and Sexual Activity  . Alcohol use: Not Currently    Alcohol/week: 0.0 standard drinks    Comment: not now  . Drug use: No  . Sexual activity: Not on file  Lifestyle  . Physical activity  Days per week: Not on file    Minutes per session: Not on file  . Stress: Not on file  Relationships  . Social Herbalist on phone: Not on file    Gets together: Not on file    Attends religious service: Not on file    Active member of club or organization: Not on file     Attends meetings of clubs or organizations: Not on file    Relationship status: Not on file  . Intimate partner violence    Fear of current or ex partner: Not on file    Emotionally abused: Not on file    Physically abused: Not on file    Forced sexual activity: Not on file  Other Topics Concern  . Not on file  Social History Narrative  . Not on file    REVIEW OF SYSTEMS: Constitutional: Per HPI.  Weakness, unsteady gait. ENT:  No nose bleeds Pulm: No shortness of breath or cough. CV:  No palpitations, no LE edema.  No angina, chest pressure. GU:  No hematuria, no frequency GI: No dysphagia.  See HPI. Heme: Other than the formed, black stools.  There is been no excessive bleeding or bruising or signs of bleeding. Transfusions: See HPI.  No prior transfusions. Neuro: Dizziness as per HPI.  No headaches, no peripheral tingling or numbness Derm:  No itching, no rash or sores.  Endocrine:  No sweats or chills.  No polyuria or dysuria Immunization: Not queried. Travel:  None beyond local counties in last few months.    PHYSICAL EXAM: Vital signs in last 24 hours: Vitals:   08/10/18 0911 08/10/18 0914  BP: (!) 145/55 (!) 145/55  Pulse: (!) 110 (!) 105  Resp: (!) 25 (!) 33  Temp: 98.2 F (36.8 C)   SpO2: 98% 96%   Wt Readings from Last 3 Encounters:  08/09/18 89.8 kg  08/05/18 93.4 kg  07/19/18 91.2 kg   General: Pleasant, overweight, pale.  Does not look acutely ill. Head: No facial asymmetry or swelling.  No signs of head trauma. Eyes: No scleral icterus. Ears: Not HOH. Nose: No congestion or discharge. Mouth: Oral mucosa pink, moist, clear.  Tongue midline.  Fair dentition. Neck: No JVD, no masses, no thyromegaly. Lungs: Clear bilaterally.  No labored breathing or cough. Heart: Irregularly irregular, rate not accelerated or slow.  No MRG.  S1, S2 present Abdomen: Soft.  Not tender or distended.  Bowel sounds active.  No HSM, masses, bruits, hernias..   Rectal:  Deferred.  Stool submitted from ED late yesterday is FOBT positive. Musc/Skeltl: No joint redness, swelling or gross deformity.  No muscular atrophy. Extremities: No CCE. Neurologic: Alert.  Oriented x3.  Moves all 4 limbs without obvious weakness.  No tremors. Skin: No rashes or sores. Tattoos: Tattoos on his arms, professional quality. Nodes: No cervical adenopathy. Psych: Calm, cooperative, fluid speech.  Intake/Output from previous day: 07/21 0701 - 07/22 0700 In: 1127.1 [I.V.:500; Blood:627.1] Out: 100 [Urine:100] Intake/Output this shift: Total I/O In: 537.9 [P.O.:240; Blood:297.9] Out: -   LAB RESULTS: Recent Labs    08/09/18 2342 08/10/18 0140  WBC 15.6* 17.2*  HGB 4.2* 3.4*  HCT 13.6* 11.1*  PLT 142* 154   BMET Lab Results  Component Value Date   NA 136 08/10/2018   NA 137 08/09/2018   NA 135 07/19/2018   K 4.4 08/10/2018   K 4.3 08/09/2018   K 4.2 07/19/2018   CL 107 08/10/2018  CL 108 08/09/2018   CL 99 07/19/2018   CO2 19 (L) 08/10/2018   CO2 18 (L) 08/09/2018   CO2 27 07/19/2018   GLUCOSE 118 (H) 08/10/2018   GLUCOSE 129 (H) 08/09/2018   GLUCOSE 109 (H) 07/19/2018   BUN 24 (H) 08/10/2018   BUN 24 (H) 08/09/2018   BUN 12 07/19/2018   CREATININE 0.99 08/10/2018   CREATININE 0.99 08/09/2018   CREATININE 0.80 07/19/2018   CALCIUM 8.3 (L) 08/10/2018   CALCIUM 8.3 (L) 08/09/2018   CALCIUM 9.8 07/19/2018   LFT Recent Labs    08/09/18 2342 08/10/18 0140  PROT 5.4* 5.2*  ALBUMIN 2.8* 2.7*  AST 84* 120*  ALT 55* 81*  ALKPHOS 57 64  BILITOT 0.8 0.9   PT/INR Lab Results  Component Value Date   INR 1.9 (H) 08/10/2018   INR 1.00 01/03/2018   INR 1.13 12/30/2017   Hepatitis Panel No results for input(s): HEPBSAG, HCVAB, HEPAIGM, HEPBIGM in the last 72 hours. C-Diff No components found for: CDIFF Lipase  No results found for: LIPASE  Drugs of Abuse  No results found for: LABOPIA, COCAINSCRNUR, LABBENZ, AMPHETMU, THCU, LABBARB    RADIOLOGY STUDIES: Dg Chest Portable 1 View  Result Date: 08/09/2018 CLINICAL DATA:  Recent syncopal episode EXAM: PORTABLE CHEST 1 VIEW COMPARISON:  02/15/2018 FINDINGS: Cardiac shadow is enlarged. Postsurgical changes are seen. The lungs are well aerated bilaterally. No focal infiltrate or sizable effusion is noted. No bony abnormality is seen. IMPRESSION: No acute abnormality noted. Electronically Signed   By: Inez Catalina M.D.   On: 08/09/2018 23:31     IMPRESSION:   *      FOBT positive, tarry stools. R/o PUD vs AVMs.   Fatty liver 16 yr ago raises specter of cirrhosis, portal gastropathy,   *    Acute, severe macrocytic anemia. 3rd of 4 U PRBCs currently transfusing  *    Chronic Eliquis for A. fib. 12/2017 bioprosthetic AVR replacement  *      Leukocytosis.  *    Polycythemia Vera.  Followed by Dr Maylon Peppers.  On Hydroxyurea.  Phlebotomy in 12/2017.     PLAN:     *   EGD set for 0815 tmrw.  Clears today CBC and pt/INR this afternoon after all 4 PRBCs finish.   Ordered abdominal ultrasound to reassess fatty liver.     Azucena Freed  08/10/2018, 9:47 AM Phone [phone]   Attending physician's note   I have taken a history, examined the patient and reviewed the chart. I agree with the Advanced Practitioner's note, impression and recommendations.  63 year old male with history of thrombocytosis, polycythemia vera, CAD status post CABG, bioprosthetic AVR on low-dose aspirin and Eliquis.  Hemoglobin 3.4 on admission, receiving 4th unit PRBC, repeat hemoglobin pending INR 1.9 Last dose of Eliquis yesterday morning  Currently hemodynamically stable  Will plan for EGD once he is adequately resuscitated with hemoglobin greater than 7  Continue to hold Eliquis  Repeat PT/INR tomorrow a.m., if greater than 1.5 with evidence of active GI bleed, will consider FFP infusion  The risks and benefits as well as alternatives of endoscopic procedure(s) have been discussed and  reviewed. All questions answered. The patient agrees to proceed.    Damaris Hippo , MD [phone]

## 2018-08-10 NOTE — ED Notes (Signed)
ED TO INPATIENT HANDOFF REPORT  ED Nurse Name and Phone #: [phone] Threasa Beards, RN  S Name/Age/Gender Justin [name] 63 y.o. male Room/Bed: 024C/024C  Code Status   Code Status: Full Code  Home/SNF/Other Home Patient oriented to: self, place, time and situation Is this baseline? Yes   Triage Complete: Triage complete  Chief Complaint syncope  Triage Note Pt presents to ED from home BIB [facility]. Pt says he was walking to bathroom became lightheaded and lowered himself to ground. No LOC, no hitting head. Abrasion on knee. + blood thinners. Hx of a. Fib. Per EMS pt initially pale, hypotensive. 500 ml IVF given with EMS. Pt also reports loss of appetite for a few days.   Allergies Allergies  Allergen Reactions  . Codeine Swelling    SWELLING REACTION UNSPECIFIED  Tolerates hydrocodone and oxycodone     Level of Care/Admitting Diagnosis ED Disposition    ED Disposition Condition [facility]: [place] [[id number]]  Level of Care: Progressive [102]  Covid Evaluation: Asymptomatic Screening Protocol (No Symptoms)  Diagnosis: GI bleed [[id number]]  Admitting Physician: Doreatha Massed  Attending Physician: Etta Quill [phone]  Estimated length of stay: past midnight tomorrow  Certification:: I certify this patient will need inpatient services for at least 2 midnights  PT Class (Do Not Modify): Inpatient [101]  PT Acc Code (Do Not Modify): Private [1]       B Medical/Surgery History Past Medical History:  Diagnosis Date  . Acute meniscal tear of knee LEFT  . Aortic stenosis 12/01/2017   NONRHEUMATIC, AORTIC VALVE CALCIFICATIONS, MILD TO MODERATE REGURG, MILD TO MODERATE CALCIFIED ANNULUS per ECHO 10/25/17 @ [facility]  . Arthritis   . Atrial fibrillation ([place]) 11/12/2017   AT O/V WITH PCP  . DM (diabetes mellitus) ([place])   . Heart murmur MILD-- ASYMPTOMATIC  . Hyperlipidemia   . Hypertension   . Left knee pain   .  PAD (peripheral artery disease) ([facility])    left leg claudication   Past Surgical History:  Procedure Laterality Date  . AORTIC VALVE REPLACEMENT N/A 01/03/2018   Procedure: AORTIC VALVE REPLACEMENT (AVR) using 63mm Magna Ease Bioprosthesis Aortic Valve;  Surgeon: Ivin Poot, MD;  Location: [place] [facility];  Service: Open Heart Surgery;  Laterality: N/A;  . APPENDECTOMY  1998  . CATARACT EXTRACTION W/ INTRAOCULAR LENS  IMPLANT, BILATERAL  1998/  2000  . CERVICAL FUSION  1985   C4 - 5  . CORONARY ARTERY BYPASS GRAFT N/A 01/03/2018   Procedure: CORONARY ARTERY BYPASS GRAFTING (CABG) x 4 (LIMA to LAD, SVG to DIAGONAL, SVG to RAMUS INTERMEDIATE, and SVG to PDA), USING LEFT INFTERNAL MAMMARY ARTERY AND;  Surgeon: Prescott Gum, Collier Salina, MD;  Location: [place];  Service: Open Heart Surgery;  Laterality: N/A;  . ENDARTERECTOMY Right 01/03/2018   Procedure: ENDARTERECTOMY CAROTID;  Surgeon: Marty Heck, MD;  Location: [place];  Service: Vascular;  Laterality: Right;  . KNEE ARTHROSCOPY W/ MENISCECTOMY  1991   LEFT KNEE  . NASAL SINUS SURGERY  1982  . RIGHT/LEFT HEART CATH AND CORONARY ANGIOGRAPHY N/A 12/24/2017   Procedure: RIGHT/LEFT HEART CATH AND CORONARY ANGIOGRAPHY;  Surgeon: Jolaine Artist, MD;  Location: [facility];  Service: Cardiovascular;  Laterality: N/A;  . ROTATOR CUFF REPAIR  09-04-2005   LEFT SHOULDER  . TEE WITHOUT CARDIOVERSION N/A 12/24/2017   Procedure: TRANSESOPHAGEAL ECHOCARDIOGRAM (TEE);  Surgeon: Jolaine Artist, MD;  [facility];  Service: Cardiovascular;  Laterality: N/A;  . TEE WITHOUT CARDIOVERSION N/A 01/03/2018   Procedure: TRANSESOPHAGEAL ECHOCARDIOGRAM (TEE);  Surgeon: Prescott Gum, Collier Salina, MD;  Location: [place];  Service: Open Heart Surgery;  Laterality: N/A;  . TONSILLECTOMY  AGE 33     A IV Location/Drains/Wounds Patient Lines/Drains/Airways Status   Active Line/Drains/Airways    Name:   Placement date:   Placement time:   Site:   Days:    Peripheral IV 08/09/18 Left Forearm   08/09/18    2256    Forearm   1   Peripheral IV 08/10/18 Right Forearm   08/10/18    0000    Forearm   less than 1   Incision (Closed) 01/03/18 Leg Right   01/03/18    1007     219   Incision (Closed) 01/03/18 Chest Other (Comment)   01/03/18    1007     219   Incision (Closed) 01/03/18 Neck Right   01/03/18    1007     219   Wound / Incision (Open or Dehisced) 01/04/18 Burn Back Right;Posterior noted skin tears, burns from zoll pads when removed    01/04/18    1100    Back   218   Wound / Incision (Open or Dehisced) 01/04/18 Other (Comment) Finger (Comment which one) Left black eschar on middle finger of left hand. Per patient, happened at work in September 2019   01/04/18    0745    Finger (Comment which one)   218          Intake/Output Last 24 hours  Intake/Output Summary (Last 24 hours) at 08/10/2018 0151 Last data filed at 08/10/2018 0140 Gross per 24 hour  Intake 750 ml  Output -  Net 750 ml    Labs/Imaging Results for orders placed or performed during the hospital encounter of 08/09/18 (from the past 48 hour(s))  CBG monitoring, ED     Status: Abnormal   Collection Time: 08/09/18 10:56 PM  Result Value Ref Range   Glucose-Capillary 103 (H) 70 - 99 mg/dL  CBC     Status: Abnormal   Collection Time: 08/09/18 11:42 PM  Result Value Ref Range   WBC 15.6 (H) 4.0 - 10.5 K/uL   RBC 1.07 (L) 4.22 - 5.81 MIL/uL   Hemoglobin 4.2 (LL) 13.0 - 17.0 g/dL    Comment: REPEATED TO VERIFY THIS CRITICAL RESULT HAS VERIFIED AND BEEN CALLED TO B.SLEDGE,RN BY MELISSA BROGDON ON 07 22 2020 AT 0021, AND HAS BEEN READ BACK.     HCT 13.6 (L) 39.0 - 52.0 %   MCV 127.1 (H) 80.0 - 100.0 fL   MCH 39.3 (H) 26.0 - 34.0 pg   MCHC 30.9 30.0 - 36.0 g/dL   RDW 22.2 (H) 11.5 - 15.5 %   Platelets 142 (L) 150 - 400 K/uL   nRBC 3.6 (H) 0.0 - 0.2 %    Comment: Performed at [facility] [address], [place], [place] [address]  Comprehensive metabolic panel      Status: Abnormal   Collection Time: 08/09/18 11:42 PM  Result Value Ref Range   Sodium 137 135 - 145 mmol/L   Potassium 4.3 3.5 - 5.1 mmol/L   Chloride 108 98 - 111 mmol/L   CO2 18 (L) 22 - 32 mmol/L   Glucose, Bld 129 (H) 70 - 99 mg/dL   BUN 24 (H) 8 - 23 mg/dL   Creatinine, Ser 0.99 0.61 -  1.24 mg/dL   Calcium 8.3 (L) 8.9 - 10.3 mg/dL   Total Protein 5.4 (L) 6.5 - 8.1 g/dL   Albumin 2.8 (L) 3.5 - 5.0 g/dL   AST 84 (H) 15 - 41 U/L   ALT 55 (H) 0 - 44 U/L   Alkaline Phosphatase 57 38 - 126 U/L   Total Bilirubin 0.8 0.3 - 1.2 mg/dL   GFR calc non Af Amer >60 >60 mL/min   GFR calc Af Amer >60 >60 mL/min   Anion gap 11 5 - 15    Comment: Performed at [facility], [place] [address] [place], [place] [address]  Type and screen [place]     Status: None (Preliminary result)   Collection Time: 08/09/18 11:43 PM  Result Value Ref Range   ABO/RH(D) A POS    Antibody Screen NEG    Sample Expiration 08/12/2018,2359    Unit Number [id number]    Blood Component Type RED CELLS,LR    Unit division 00    Status of Unit ALLOCATED    Transfusion Status OK TO TRANSFUSE    Crossmatch Result Compatible    Unit Number [id number]    Blood Component Type RED CELLS,LR    Unit division 00    Status of Unit ISSUED    Transfusion Status OK TO TRANSFUSE    Crossmatch Result      Compatible Performed at [facility], [place] [address], [place], [place] [address]   POC occult blood, ED     Status: Abnormal   Collection Time: 08/09/18 11:51 PM  Result Value Ref Range   Fecal Occult Bld POSITIVE (A) NEGATIVE  Protime-INR     Status: Abnormal   Collection Time: 08/10/18 12:14 AM  Result Value Ref Range   Prothrombin Time 21.9 (H) 11.4 - 15.2 seconds   INR 1.9 (H) 0.8 - 1.2    Comment: (NOTE) INR goal varies based on device and disease states. Performed at [facility], [place] [address] [place], [place] [address]   Prepare RBC     Status: None   Collection  Time: 08/10/18 12:27 AM  Result Value Ref Range   Order Confirmation      ORDER PROCESSED BY BLOOD BANK Performed at [facility], [place] [address] [place], [place] [address]    Dg Chest Portable 1 View  Result Date: 08/09/2018 CLINICAL DATA:  Recent syncopal episode EXAM: PORTABLE CHEST 1 VIEW COMPARISON:  02/15/2018 FINDINGS: Cardiac shadow is enlarged. Postsurgical changes are seen. The lungs are well aerated bilaterally. No focal infiltrate or sizable effusion is noted. No bony abnormality is seen. IMPRESSION: No acute abnormality noted. Electronically Signed   By: Inez Catalina M.D.   On: 08/09/2018 23:31    Pending Labs Unresulted Labs (From admission, onward)    Start     Ordered   08/10/18 0500  CBC  Tomorrow morning,   R     08/10/18 0050   08/10/18 0500  Comprehensive metabolic panel  Tomorrow morning,   R     08/10/18 0109   08/10/18 0102  SARS Coronavirus 2 (CEPHEID - Performed in [facility] hospital lab), [facility] Order  Once,   STAT    Question:  Rule Out  Answer:  Yes   08/10/18 0101   08/10/18 0057  HIV antibody (Routine Testing)  Once,   STAT     08/10/18 0059   08/10/18 0056  Hemoglobin A1c  Once,  STAT    Comments: To assess prior glycemic control    08/10/18 0055          Vitals/Pain Today's Vitals   08/10/18 0000 08/10/18 0100 08/10/18 0140 08/10/18 0145  BP: (!) 119/55 (!) 119/50 (!) 124/46 (!) 127/53  Pulse:   96 100  Resp: (!) 23 (!) 21 (!) 29 (!) 26  Temp:   97.8 F (36.6 C)   TempSrc:   Oral   SpO2:   100% 100%  Weight:      Height:      PainSc:        Isolation Precautions No active isolations  Medications Medications  pantoprazole (PROTONIX) 80 mg in sodium chloride 0.9 % 250 mL (0.32 mg/mL) infusion (8 mg/hr Intravenous New Bag/Given 08/10/18 0109)  0.9 %  sodium chloride infusion (has no administration in time range)  pregabalin (LYRICA) capsule 50 mg (has no administration in time range)  rosuvastatin (CRESTOR) tablet 20 mg (has  no administration in time range)  zolpidem (AMBIEN) tablet 10 mg (has no administration in time range)  fluticasone (FLONASE) 50 MCG/ACT nasal spray 2 spray (has no administration in time range)  cholecalciferol (VITAMIN D3) tablet 5,000 Units (has no administration in time range)  cyclobenzaprine (FLEXERIL) tablet 10 mg (has no administration in time range)  insulin aspart (novoLOG) injection 0-9 Units (has no administration in time range)  acetaminophen (TYLENOL) tablet 650 mg (has no administration in time range)    Or  acetaminophen (TYLENOL) suppository 650 mg (has no administration in time range)  ondansetron (ZOFRAN) tablet 4 mg (has no administration in time range)    Or  ondansetron (ZOFRAN) injection 4 mg (has no administration in time range)  pantoprazole (PROTONIX) 80 mg in sodium chloride 0.9 % 100 mL IVPB (0 mg Intravenous Stopped 08/10/18 0116)    Mobility walks High fall risk   Focused Assessments Cardiac Assessment Handoff:    No results found for: CKTOTAL, CKMB, CKMBINDEX, TROPONINI No results found for: DDIMER Does the Patient currently have chest pain? No      R Recommendations: See Admitting Provider Note  Report given to:   Additional Notes:

## 2018-08-11 ENCOUNTER — Encounter (HOSPITAL_COMMUNITY): Payer: Self-pay | Admitting: Certified Registered"

## 2018-08-11 ENCOUNTER — Telehealth (HOSPITAL_COMMUNITY): Payer: Self-pay | Admitting: *Deleted

## 2018-08-11 ENCOUNTER — Inpatient Hospital Stay (HOSPITAL_COMMUNITY): Payer: BC Managed Care – PPO | Admitting: Certified Registered"

## 2018-08-11 ENCOUNTER — Encounter (HOSPITAL_COMMUNITY)
Admission: EM | Disposition: A | Discharge status: Home / Self Care | Payer: Self-pay | Source: Home / Self Care | Attending: Internal Medicine

## 2018-08-11 DIAGNOSIS — K250 Acute gastric ulcer with hemorrhage: Principal | ICD-10-CM

## 2018-08-11 DIAGNOSIS — K921 Melena: Secondary | ICD-10-CM

## 2018-08-11 DIAGNOSIS — K2951 Unspecified chronic gastritis with bleeding: Secondary | ICD-10-CM | POA: Diagnosis not present

## 2018-08-11 HISTORY — PX: BIOPSY: SHX5522

## 2018-08-11 HISTORY — PX: ESOPHAGOGASTRODUODENOSCOPY (EGD) WITH PROPOFOL: SHX5813

## 2018-08-11 LAB — BPAM RBC
Blood Product Expiration Date: 202008082359
Blood Product Expiration Date: 202008102359
Blood Product Expiration Date: 202008112359
Blood Product Expiration Date: 202008112359
ISSUE DATE / TIME: 202007220122
ISSUE DATE / TIME: 202007220455
ISSUE DATE / TIME: 202007220846
ISSUE DATE / TIME: 202007221122
Unit Type and Rh: 6200
Unit Type and Rh: 6200
Unit Type and Rh: 6200
Unit Type and Rh: 6200

## 2018-08-11 LAB — CBC
HCT: 11.1 % — ABNORMAL LOW (ref 39.0–52.0)
Hemoglobin: 3.4 g/dL — CL (ref 13.0–17.0)
MCH: 38.6 pg — ABNORMAL HIGH (ref 26.0–34.0)
MCHC: 30.6 g/dL (ref 30.0–36.0)
MCV: 126.1 fL — ABNORMAL HIGH (ref 80.0–100.0)
Platelets: 154 K/uL (ref 150–400)
RBC: <1 MIL/uL — ABNORMAL LOW (ref 4.22–5.81)
RDW: 22.1 % — ABNORMAL HIGH (ref 11.5–15.5)
WBC: 17.2 K/uL — ABNORMAL HIGH (ref 4.0–10.5)
nRBC: 3.3 % — ABNORMAL HIGH (ref 0.0–0.2)

## 2018-08-11 LAB — TYPE AND SCREEN
ABO/RH(D): A POS
Antibody Screen: NEGATIVE
Unit division: 0
Unit division: 0
Unit division: 0
Unit division: 0

## 2018-08-11 LAB — GLUCOSE, CAPILLARY
Glucose-Capillary: 123 mg/dL — ABNORMAL HIGH (ref 70–99)
Glucose-Capillary: 115 mg/dL — ABNORMAL HIGH (ref 70–99)
Glucose-Capillary: 111 mg/dL — ABNORMAL HIGH (ref 70–99)
Glucose-Capillary: 104 mg/dL — ABNORMAL HIGH (ref 70–99)
Glucose-Capillary: 118 mg/dL — ABNORMAL HIGH (ref 70–99)
Glucose-Capillary: 140 mg/dL — ABNORMAL HIGH (ref 70–99)

## 2018-08-11 LAB — HEMOGLOBIN AND HEMATOCRIT, BLOOD
HCT: 24.9 % — ABNORMAL LOW (ref 39.0–52.0)
HCT: 24.4 % — ABNORMAL LOW (ref 39.0–52.0)
Hemoglobin: 8.5 g/dL — ABNORMAL LOW (ref 13.0–17.0)
Hemoglobin: 8.3 g/dL — ABNORMAL LOW (ref 13.0–17.0)

## 2018-08-11 SURGERY — ESOPHAGOGASTRODUODENOSCOPY (EGD) WITH PROPOFOL
Anesthesia: Monitor Anesthesia Care

## 2018-08-11 MED ORDER — LIDOCAINE 2% (20 MG/ML) 5 ML SYRINGE
INTRAMUSCULAR | Status: DC | PRN
  Administered 2018-08-11: 40 mg via INTRAVENOUS

## 2018-08-11 MED ORDER — PROPOFOL 10 MG/ML IV BOLUS
INTRAVENOUS | Status: DC | PRN
  Administered 2018-08-11 (×2): 20 mg via INTRAVENOUS
  Administered 2018-08-11: 30 mg via INTRAVENOUS

## 2018-08-11 MED ORDER — LACTATED RINGERS IV SOLN
INTRAVENOUS | Status: DC
  Administered 2018-08-11: 09:00:00 via INTRAVENOUS

## 2018-08-11 MED ORDER — FUROSEMIDE 10 MG/ML IJ SOLN
20.0000 mg | Freq: Once | INTRAMUSCULAR | Status: AC
  Administered 2018-08-11: 20 mg via INTRAVENOUS
  Filled 2018-08-11: qty 2

## 2018-08-11 MED ORDER — METOPROLOL SUCCINATE ER 25 MG PO TB24: 12.5000 mg | ORAL_TABLET | Freq: Once | ORAL | Status: DC

## 2018-08-11 MED ORDER — METOPROLOL SUCCINATE ER 25 MG PO TB24
12.5000 mg | ORAL_TABLET | Freq: Every day | ORAL | Status: DC
  Administered 2018-08-12 – 2018-08-13 (×2): 12.5 mg via ORAL
  Filled 2018-08-11 (×2): qty 1

## 2018-08-11 MED ORDER — PROPOFOL 500 MG/50ML IV EMUL
INTRAVENOUS | Status: DC | PRN
  Administered 2018-08-11: 100 ug/kg/min via INTRAVENOUS

## 2018-08-11 MED ORDER — INSULIN ASPART 100 UNIT/ML ~~LOC~~ SOLN
0.0000 [IU] | Freq: Three times a day (TID) | SUBCUTANEOUS | Status: DC
  Administered 2018-08-12 (×2): 2 [IU] via SUBCUTANEOUS

## 2018-08-11 MED ORDER — INSULIN ASPART 100 UNIT/ML ~~LOC~~ SOLN: 0.0000 [IU] | Freq: Every day | SUBCUTANEOUS | Status: DC

## 2018-08-11 MED ORDER — METOPROLOL SUCCINATE ER 25 MG PO TB24
12.5000 mg | ORAL_TABLET | Freq: Every day | ORAL | Status: DC
  Administered 2018-08-11: 12.5 mg via ORAL
  Filled 2018-08-11: qty 1

## 2018-08-11 MED ORDER — SODIUM CHLORIDE 0.9 % IV SOLN: INTRAVENOUS | Status: DC

## 2018-08-11 MED ORDER — PANTOPRAZOLE SODIUM 40 MG PO TBEC
40.0000 mg | DELAYED_RELEASE_TABLET | Freq: Two times a day (BID) | ORAL | Status: DC
  Administered 2018-08-11 – 2018-08-13 (×5): 40 mg via ORAL
  Filled 2018-08-11 (×5): qty 1

## 2018-08-11 MED ORDER — METOPROLOL SUCCINATE ER 25 MG PO TB24: 25.0000 mg | ORAL_TABLET | Freq: Every day | ORAL | Status: DC

## 2018-08-11 SURGICAL SUPPLY — 15 items

## 2018-08-11 NOTE — Progress Notes (Signed)
Cardiac Individual Treatment Plan  Patient Details  Name: Justin [name] MRN: [id number] Date of Birth: [birth year] Referring Provider:     CARDIAC REHAB PHASE II ORIENTATION from 03/03/2018 in [place]  Referring Provider  Dr. Harrell Gave      Initial Encounter Date:    CARDIAC REHAB PHASE II ORIENTATION from 03/03/2018 in [facility]  Date  03/03/18      Visit Diagnosis: S/P AVR (aortic valve replacement)  S/P CABG x 4  Patient's Home Medications on Admission: No current facility-administered medications for this encounter.  No current outpatient medications on file.  Facility-Administered Medications Ordered in Other Encounters:  .  0.9 %  sodium chloride infusion (Manually program via Guardrails IV Fluids), , Intravenous, Once, Hall, Carole N, DO .  acetaminophen (TYLENOL) tablet 650 mg, 650 mg, Oral, Q6H PRN, 650 mg at 08/10/18 1202 **OR** acetaminophen (TYLENOL) suppository 650 mg, 650 mg, Rectal, Q6H PRN, Etta Quill, DO .  cholecalciferol (VITAMIN D3) tablet 5,000 Units, 5,000 Units, Oral, Daily, Etta Quill, DO, 5,000 Units at 08/11/18 1109 .  cyclobenzaprine (FLEXERIL) tablet 10 mg, 10 mg, Oral, TID, Etta Quill, DO, 10 mg at 08/11/18 1110 .  fluticasone (FLONASE) 50 MCG/ACT nasal spray 2 spray, 2 spray, Each Nare, Daily, Etta Quill, DO, 2 spray at 08/11/18 1132 .  insulin aspart (novoLOG) injection 0-9 Units, 0-9 Units, Subcutaneous, Q4H, Etta Quill, DO, 1 Units at 08/11/18 0454 .  levalbuterol (XOPENEX) nebulizer solution 0.63 mg, 0.63 mg, Nebulization, Q6H PRN, Kirby-Graham, Karsten Fells, NP, 0.63 mg at 08/10/18 2315 .  metoprolol succinate (TOPROL-XL) 24 hr tablet 12.5 mg, 12.5 mg, Oral, Daily, Hall, Carole N, DO .  ondansetron (ZOFRAN) tablet 4 mg, 4 mg, Oral, Q6H PRN **OR** ondansetron (ZOFRAN) injection 4 mg, 4 mg, Intravenous, Q6H PRN, Alcario Drought, Jared M, DO .  pantoprazole  (PROTONIX) EC tablet 40 mg, 40 mg, Oral, BID AC, Nandigam, Kavitha V, MD .  pregabalin (LYRICA) capsule 50 mg, 50 mg, Oral, TID, Jennette Kettle M, DO, 50 mg at 08/11/18 1110 .  rosuvastatin (CRESTOR) tablet 20 mg, 20 mg, Oral, Daily, Alcario Drought, Jared M, DO, 20 mg at 08/11/18 1110 .  zolpidem (AMBIEN) tablet 10 mg, 10 mg, Oral, QHS, Alcario Drought [employer], DO  Past Medical History: Past Medical History:  Diagnosis Date  . Acute meniscal tear of knee LEFT  . Aortic stenosis 12/01/2017   NONRHEUMATIC, AORTIC VALVE CALCIFICATIONS, MILD TO MODERATE REGURG, MILD TO MODERATE CALCIFIED ANNULUS per ECHO 10/25/17 @ [facility]  . Arthritis   . Atrial fibrillation ([facility]) 11/12/2017   AT O/V WITH PCP  . DM (diabetes mellitus) ([place])   . Heart murmur MILD-- ASYMPTOMATIC  . Hyperlipidemia   . Hypertension   . Left knee pain   . PAD (peripheral artery disease) ([facility])    left leg claudication    Tobacco Use: Social History   Tobacco Use  Smoking Status Former Smoker  . Packs/day: 0.25  . Years: 23.00  . Pack years: 5.75  . Types: Cigars  . Quit date: 01/02/2018  . Years since quitting: 0.6  Smokeless Tobacco Never Used  Tobacco Comment   quit cigs in 2011 and smokes cigars daily- from 3-10 cigars-09/27/13    Labs: Recent Review Flowsheet Data    Labs for ITP Cardiac and Pulmonary Rehab Latest Ref Rng & Units 01/05/2018 01/06/2018 01/07/2018 02/17/2018 08/10/2018   Cholestrol 0 - 200 mg/dL - - -  127 -   LDLCALC 0 - 99 mg/dL - - - 70 -   HDL >40 mg/dL - - - 41 -   Trlycerides <150 mg/dL - - - 80 -   Hemoglobin A1c 4.8 - 5.6 % - - - - 4.9   PHART 7.350 - 7.450 - - - - -   PCO2ART 32.0 - 48.0 mmHg - - - - -   HCO3 20.0 - 28.0 mmol/L - - - - -   TCO2 22 - 32 mmol/L - 30 - - -   ACIDBASEDEF 0.0 - 2.0 mmol/L - - - - -   O2SAT % 67.1 84.4 93.8 - -      Capillary Blood Glucose: Lab Results  Component Value Date   GLUCAP 104 (H) 08/11/2018   GLUCAP 111 (H) 08/11/2018   GLUCAP 115 (H)  08/11/2018   GLUCAP 123 (H) 08/11/2018   GLUCAP 130 (H) 08/10/2018     Exercise Target Goals: Exercise Program Goal: Individual exercise prescription set using results from initial 6 min walk test and THRR while considering  patient's activity barriers and safety.   Exercise Prescription Goal: Initial exercise prescription builds to 30-45 minutes a day of aerobic activity, 2-3 days per week.  Home exercise guidelines will be given to patient during program as part of exercise prescription that the participant will acknowledge.  Activity Barriers & Risk Stratification: Activity Barriers & Cardiac Risk Stratification - 03/03/18 1056      Activity Barriers & Cardiac Risk Stratification   Activity Barriers  Back Problems;Other (comment);Muscular Weakness;Deconditioning;History of Falls    Comments  Rotator cuff (Old injury)     Cardiac Risk Stratification  High       6 Minute Walk: 6 Minute Walk    Row Name 03/03/18 1055         6 Minute Walk   Phase  Initial     Distance  1325 feet     Walk Time  6 minutes     # of Rest Breaks  1     MPH  2.5     METS  3.2     RPE  13     VO2 Peak  11.2     Symptoms  Yes (comment)     Comments  Claudication 7/10 left leg     Resting HR  77 bpm     Resting BP  108/72     Resting Oxygen Saturation   96 %     Exercise Oxygen Saturation  during 6 min walk  96 %     Max Ex. HR  108 bpm     Max Ex. BP  130/82     2 Minute Post BP  112/64        Oxygen Initial Assessment:   Oxygen Re-Evaluation:   Oxygen Discharge (Final Oxygen Re-Evaluation):   Initial Exercise Prescription: Initial Exercise Prescription - 03/03/18 1100      Date of Initial Exercise RX and Referring Provider   Date  03/03/18    Referring Provider  Dr. Harrell Gave    Expected Discharge Date  06/10/18      Bike   Level  0.5    Minutes  10    METs  2.11      NuStep   Level  2    SPM  75    Minutes  10    METs  3      Track   Laps  9  Minutes  10     METs  2.53      Prescription Details   Frequency (times per week)  3    Duration  Progress to 30 minutes of continuous aerobic without signs/symptoms of physical distress      Intensity   THRR 40-80% of Max Heartrate  62-125    Ratings of Perceived Exertion  11-13      Progression   Progression  Continue to progress workloads to maintain intensity without signs/symptoms of physical distress.      Resistance Training   Training Prescription  Yes    Weight  3 lbs.     Reps  10-15       Perform Capillary Blood Glucose checks as needed.  Exercise Prescription Changes: Exercise Prescription Changes    Row Name 03/14/18 1050 03/28/18 0951 04/01/18 0951 07/27/18 1100 08/03/18 1055     Response to Exercise   Blood Pressure (Admit)  106/64  102/68  100/60  128/60  104/72   Blood Pressure (Exercise)  144/80  130/68  110/70  130/74  118/62   Blood Pressure (Exit)  130/68  102/70  98/64  104/60  100/60   Heart Rate (Admit)  99 bpm  92 bpm  98 bpm  92 bpm  86 bpm   Heart Rate (Exercise)  121 bpm  117 bpm  112 bpm  110 bpm  111 bpm   Heart Rate (Exit)  99 bpm  95 bpm  98 bpm  91 bpm  86 bpm   Rating of Perceived Exertion (Exercise)  13  13  15  13  12    Perceived Dyspnea (Exercise)  -  -  -  0  0   Symptoms  none  none  none  None  None   Comments  -  -  -  Pt's first day of exercise since department closure  -   Duration  Progress to 30 minutes of  aerobic without signs/symptoms of physical distress  Progress to 30 minutes of  aerobic without signs/symptoms of physical distress  Progress to 30 minutes of  aerobic without signs/symptoms of physical distress  Progress to 30 minutes of  aerobic without signs/symptoms of physical distress  Progress to 30 minutes of  aerobic without signs/symptoms of physical distress   Intensity  THRR unchanged  THRR unchanged  THRR unchanged  THRR unchanged  THRR unchanged     Progression   Progression  Continue to progress workloads to maintain intensity  without signs/symptoms of physical distress.  Continue to progress workloads to maintain intensity without signs/symptoms of physical distress.  Continue to progress workloads to maintain intensity without signs/symptoms of physical distress.  Continue to progress workloads to maintain intensity without signs/symptoms of physical distress.  Continue to progress workloads to maintain intensity without signs/symptoms of physical distress.   Average METs  2  2.6  2.5  2.4  2.3     Resistance Training   Training Prescription  Yes  Yes  Yes  No  No Relaxation day, no weights.   Weight  5lbs  5lbs  5lbs  -  -   Reps  10-15  10-15  10-15  -  -   Time  10 Minutes  10 Minutes  10 Minutes  -  -     Interval Training   Interval Training  No  No  No  No  No     Bike   Level  0.5  -  -  -  -  Minutes  5 Switched to recumbent bike due to poor posture  -  -  -  -   METs  -  -  -  -  -     Recumbant Bike   Level  1  1  -  -  -   Minutes  5  10  -  -  -   METs  -  2.5  -  -  -     NuStep   Level  -  -  3  3  3    SPM  -  -  -  85  85   Minutes  -  -  10  10  15    METs  -  -  2.7  2.51  2.3     Arm Ergometer   Level  1  1  1  1   2.1   Watts  -  -  13  15  -   Minutes  10  10  10  10  15    METs  -  -  1.75  1.8  2.3     Track   Laps  6  10  11   -  -   Minutes  10  10  10   -  -   METs  2.03  2.74  2.91  -  -     Home Exercise Plan   Plans to continue exercise at  -  Home (comment) Walking  Home (comment) Walking  Home (comment) Walking  Home (comment) Walking   Frequency  -  Add 2 additional days to program exercise sessions.  Add 2 additional days to program exercise sessions.  Add 2 additional days to program exercise sessions.  Add 2 additional days to program exercise sessions.   Initial Home Exercises Provided  -  03/18/18  03/18/18  03/18/18  03/18/18      Exercise Comments: Exercise Comments    Row Name 03/14/18 1050 03/28/18 1005 03/28/18 1030 07/27/18 1111 08/10/18 1224    Exercise Comments  METs reviewed with patient. Poor posture on the upright bike, switched to recumbent bike.  Reviewed METs and goals with patient.  Home exercise guidelines reviewed with patient on 03/18/2018.  Pt's first day of exercise since departmental closure. Pt tolerated exercise well. Will continue to monitor.  Patient absent due to [facility] readmission.      Exercise Goals and Review: Exercise Goals    Row Name 03/03/18 1057             Exercise Goals   Increase Physical Activity  Yes       Intervention  Provide advice, education, support and counseling about physical activity/exercise needs.;Develop an individualized exercise prescription for aerobic and resistive training based on initial evaluation findings, risk stratification, comorbidities and participant's personal goals.       Expected Outcomes  Short Term: Attend rehab on a regular basis to increase amount of physical activity.       Increase Strength and Stamina  Yes       Intervention  Provide advice, education, support and counseling about physical activity/exercise needs.;Develop an individualized exercise prescription for aerobic and resistive training based on initial evaluation findings, risk stratification, comorbidities and participant's personal goals.       Expected Outcomes  Short Term: Increase workloads from initial exercise prescription for resistance, speed, and METs.       Able to understand and use rate of perceived exertion (RPE) scale  Yes       Intervention  Provide education and explanation on how to use RPE scale       Expected Outcomes  Short Term: Able to use RPE daily in rehab to express subjective intensity level;Long Term:  Able to use RPE to guide intensity level when exercising independently       Knowledge and understanding of Target Heart Rate Range (THRR)  Yes       Intervention  Provide education and explanation of THRR including how the numbers were predicted and where they are located for  reference       Expected Outcomes  Short Term: Able to state/look up THRR;Long Term: Able to use THRR to govern intensity when exercising independently;Short Term: Able to use daily as guideline for intensity in rehab       Able to check pulse independently  Yes       Intervention  Provide education and demonstration on how to check pulse in carotid and radial arteries.;Review the importance of being able to check your own pulse for safety during independent exercise       Expected Outcomes  Short Term: Able to explain why pulse checking is important during independent exercise;Long Term: Able to check pulse independently and accurately       Understanding of Exercise Prescription  Yes       Intervention  Provide education, explanation, and written materials on patient's individual exercise prescription       Expected Outcomes  Short Term: Able to explain program exercise prescription;Long Term: Able to explain home exercise prescription to exercise independently       Improve claudication pain toleration; Improve walking ability  Yes       Intervention  Participate in PAD/SET Rehab 2-3 days a week, walking at home as part of exercise prescription;Attend education sessions to aid in risk factor modification and understanding of disease process       Expected Outcomes  Short Term: Improve walking distance/time to onset of claudication pain;Long Term: Improve score of PAD questionnaires;Long Term: Improve walking ability and toleration to claudication          Exercise Goals Re-Evaluation : Exercise Goals Re-Evaluation    Row Name 03/14/18 1050 03/18/18 1030 03/28/18 1005 04/06/18 1119 08/10/18 1221     Exercise Goal Re-Evaluation   Exercise Goals Review  Increase Physical Activity;Able to understand and use rate of perceived exertion (RPE) scale  Increase Physical Activity;Able to understand and use rate of perceived exertion (RPE) scale  Increase Physical Activity;Able to understand and use rate  of perceived exertion (RPE) scale;Understanding of Exercise Prescription;Knowledge and understanding of Target Heart Rate Range (THRR);Improve claudication pain tolerance and improve walking ability  -  Increase Physical Activity;Able to understand and use rate of perceived exertion (RPE) scale;Understanding of Exercise Prescription;Knowledge and understanding of Target Heart Rate Range (THRR);Improve claudication pain tolerance and improve walking ability   Comments  Patient able to understand and use RPE scale appropriately.  Patient understands exercise guidelines and what is expected of him at home. Patient will start walking a couple of days per week outside weather permitting.  Patient is walking 2-3 days/week at the supermarket as his mode of home exercise. Exercise is limited by claudication pain in left leg.  Temporary department closure due to COVID-19.  Patient resumed exercise in cardiac rehab on 07/27/2018, and has been doing well. Pt has been out since 08/03/2018, MD appt, dental appt, and out today 08/10/2018 due to hospital  admission for near syncopal episode at home. Unable to review goals at this time.   Expected Outcomes  Progress workloads as tolerated to help increase strength and stamina and be able to walk further.  Patient will add walking outside at home at least 2 days per week.  Continue to increase walking as tolerated to help improve walking ability.   -  Resume exercise when cleared by physician.      Discharge Exercise Prescription (Final Exercise Prescription Changes): Exercise Prescription Changes - 08/03/18 1055      Response to Exercise   Blood Pressure (Admit)  104/72    Blood Pressure (Exercise)  118/62    Blood Pressure (Exit)  100/60    Heart Rate (Admit)  86 bpm    Heart Rate (Exercise)  111 bpm    Heart Rate (Exit)  86 bpm    Rating of Perceived Exertion (Exercise)  12    Perceived Dyspnea (Exercise)  0    Symptoms  None    Duration  Progress to 30 minutes of   aerobic without signs/symptoms of physical distress    Intensity  THRR unchanged      Progression   Progression  Continue to progress workloads to maintain intensity without signs/symptoms of physical distress.    Average METs  2.3      Resistance Training   Training Prescription  No   Relaxation day, no weights.     Interval Training   Interval Training  No      NuStep   Level  3    SPM  85    Minutes  15    METs  2.3      Arm Ergometer   Level  2.1    Minutes  15    METs  2.3      Home Exercise Plan   Plans to continue exercise at  Home (comment)   Walking   Frequency  Add 2 additional days to program exercise sessions.    Initial Home Exercises Provided  03/18/18       Nutrition:  Target Goals: Understanding of nutrition guidelines, daily intake of sodium 1500mg , cholesterol 200mg , calories 30% from fat and 7% or less from saturated fats, daily to have 5 or more servings of fruits and vegetables.  Biometrics: Pre Biometrics - 03/03/18 1057      Pre Biometrics   Height  5' 7.25" (1.708 m)    Weight  87.5 kg    Waist Circumference  42.5 inches    Hip Circumference  43 inches    Waist to Hip Ratio  0.99 %    BMI (Calculated)  29.99    Triceps Skinfold  14 mm    % Body Fat  29.3 %    Grip Strength  29 kg    Flexibility  0 in    Single Leg Stand  10.75 seconds        Nutrition Therapy Plan and Nutrition Goals: Nutrition Therapy & Goals - 03/03/18 1108      Nutrition Therapy   Diet  heart healthy, carb modified      Personal Nutrition Goals   Nutrition Goal  Pt to identify and limit food sources of saturated fat, trans fat, refined carbohydrates and sodium     Personal Goal #2  Pt to build a healthy plate including vegetables, fruits, whole grains, and low-fat dairy products in a heart healthy meal plan    Personal Goal #3  Pt to eat  a variety of non-starchy vegetables    Personal Goal #4  Pt able to name foods that affect blood glucose        Intervention Plan   Intervention  Prescribe, educate and counsel regarding individualized specific dietary modifications aiming towards targeted core components such as weight, hypertension, lipid management, diabetes, heart failure and other comorbidities.    Expected Outcomes  Short Term Goal: Understand basic principles of dietary content, such as calories, fat, sodium, cholesterol and nutrients.;Long Term Goal: Adherence to prescribed nutrition plan.       Nutrition Assessments: Nutrition Assessments - 03/03/18 1112      MEDFICTS Scores   Pre Score  36       Nutrition Goals Re-Evaluation: Nutrition Goals Re-Evaluation    Row Name 03/03/18 1108             Goals   Current Weight  192 lb 14.4 oz (87.5 kg)          Nutrition Goals Re-Evaluation: Nutrition Goals Re-Evaluation    [place] Name 03/03/18 1108             Goals   Current Weight  192 lb 14.4 oz (87.5 kg)          Nutrition Goals Discharge (Final Nutrition Goals Re-Evaluation): Nutrition Goals Re-Evaluation - 03/03/18 1108      Goals   Current Weight  192 lb 14.4 oz (87.5 kg)       Psychosocial: Target Goals: Acknowledge presence or absence of significant depression and/or stress, maximize coping skills, provide positive support system. Participant is able to verbalize types and ability to use techniques and skills needed for reducing stress and depression.  Initial Review & Psychosocial Screening: Initial Psych Review & Screening - 03/03/18 1220      Initial Review   Current issues with  None Identified      Family Dynamics   Good Support System?  Yes   Pt lists his wife and family as sources of support.      Barriers   Psychosocial barriers to participate in program  There are no identifiable barriers or psychosocial needs.      Screening Interventions   Interventions  Encouraged to exercise       Quality of Life Scores: Quality of Life - 03/03/18 1058      Quality of Life   Select   Quality of Life      Quality of Life Scores   Health/Function Pre  25.43 %    Socioeconomic Pre  26.71 %    Psych/Spiritual Pre  27.21 %    Family Pre  24.75 %    GLOBAL Pre  25.09 %      Scores of 19 and below usually indicate a poorer quality of life in these areas.  A difference of  2-3 points is a clinically meaningful difference.  A difference of 2-3 points in the total score of the Quality of Life Index has been associated with significant improvement in overall quality of life, self-image, physical symptoms, and general health in studies assessing change in quality of life.  PHQ-9: Recent Review Flowsheet Data    Depression screen [facility] 2/9 07/29/2018 03/11/2018 03/20/2014   Decreased Interest 0 0 0   Down, Depressed, Hopeless 0 0 0   PHQ - 2 Score 0 0 0     Interpretation of Total Score  Total Score Depression Severity:  1-4 = Minimal depression, 5-9 = Mild depression, 10-14 = Moderate depression, 15-19 =  Moderately severe depression, 20-27 = Severe depression   Psychosocial Evaluation and Intervention: Psychosocial Evaluation - 08/11/18 1452      Psychosocial Evaluation & Interventions   Interventions  Encouraged to exercise with the program and follow exercise prescription    Comments  No psychosocial needs identified.    Expected Outcomes  Khup will maintain a positive outlook with good coping skills.    Continue Psychosocial Services   No Follow up required       Psychosocial Re-Evaluation: Psychosocial Re-Evaluation    [place] Name 03/18/18 1719 08/11/18 1438           Psychosocial Re-Evaluation   Current issues with  None Identified  None Identified      Comments  -  No psychosocial needs identified.      Expected Outcomes  -  Santonio will continue to exhibit a positive outlook.      Interventions  Encouraged to attend Cardiac Rehabilitation for the exercise  Encouraged to attend Cardiac Rehabilitation for the exercise      Continue Psychosocial Services   No Follow up  required  No Follow up required         Psychosocial Discharge (Final Psychosocial Re-Evaluation): Psychosocial Re-Evaluation - 08/11/18 1438      Psychosocial Re-Evaluation   Current issues with  None Identified    Comments  No psychosocial needs identified.    Expected Outcomes  Jasier will continue to exhibit a positive outlook.    Interventions  Encouraged to attend Cardiac Rehabilitation for the exercise    Continue Psychosocial Services   No Follow up required       Vocational Rehabilitation: Provide vocational rehab assistance to qualifying candidates.   Vocational Rehab Evaluation & Intervention: Vocational Rehab - 03/03/18 1221      Initial Vocational Rehab Evaluation & Intervention   Assessment shows need for Vocational Rehabilitation  Yes       Education: Education Goals: Education classes will be provided on a weekly basis, covering required topics. Participant will state understanding/return demonstration of topics presented.  Learning Barriers/Preferences: Learning Barriers/Preferences - 03/03/18 1100      Learning Barriers/Preferences   Learning Barriers  Sight    Learning Preferences  Written Material;Audio       Education Topics: Count Your Pulse:  -Group instruction provided by verbal instruction, demonstration, patient participation and written materials to support subject.  Instructors address importance of being able to find your pulse and how to count your pulse when at home without a heart monitor.  Patients get hands on experience counting their pulse with staff help and individually.   CARDIAC REHAB PHASE II EXERCISE from 04/01/2018 in [facility] [facility]  Date  03/18/18  Instruction Review Code  2- Demonstrated Understanding      Heart Attack, Angina, and Risk Factor Modification:  -Group instruction provided by verbal instruction, video, and written materials to support subject.  Instructors address signs and symptoms of  angina and heart attacks.    Also discuss risk factors for heart disease and how to make changes to improve heart health risk factors.   Functional Fitness:  -Group instruction provided by verbal instruction, demonstration, patient participation, and written materials to support subject.  Instructors address safety measures for doing things around the house.  Discuss how to get up and down off the floor, how to pick things up properly, how to safely get out of a chair without assistance, and balance training.   Meditation and Mindfulness:  -  Group instruction provided by verbal instruction, patient participation, and written materials to support subject.  Instructor addresses importance of mindfulness and meditation practice to help reduce stress and improve awareness.  Instructor also leads participants through a meditation exercise.    Stretching for Flexibility and Mobility:  -Group instruction provided by verbal instruction, patient participation, and written materials to support subject.  Instructors lead participants through series of stretches that are designed to increase flexibility thus improving mobility.  These stretches are additional exercise for major muscle groups that are typically performed during regular warm up and cool down.   Hands Only CPR:  -Group verbal, video, and participation provides a basic overview of AHA guidelines for community CPR. Role-play of emergencies allow participants the opportunity to practice calling for help and chest compression technique with discussion of AED use.   Hypertension: -Group verbal and written instruction that provides a basic overview of hypertension including the most recent diagnostic guidelines, risk factor reduction with self-care instructions and medication management.   CARDIAC REHAB PHASE II EXERCISE from 04/01/2018 in [place]  Date  04/01/18  Instruction Review Code  2- Demonstrated  Understanding       Nutrition I class: Heart Healthy Eating:  -Group instruction provided by PowerPoint slides, verbal discussion, and written materials to support subject matter. The instructor gives an explanation and review of the Therapeutic Lifestyle Changes diet recommendations, which includes a discussion on lipid goals, dietary fat, sodium, fiber, plant stanol/sterol esters, sugar, and the components of a well-balanced, healthy diet.   Nutrition II class: Lifestyle Skills:  -Group instruction provided by PowerPoint slides, verbal discussion, and written materials to support subject matter. The instructor gives an explanation and review of label reading, grocery shopping for heart health, heart healthy recipe modifications, and ways to make healthier choices when eating out.   Diabetes Question & Answer:  -Group instruction provided by PowerPoint slides, verbal discussion, and written materials to support subject matter. The instructor gives an explanation and review of diabetes co-morbidities, pre- and post-prandial blood glucose goals, pre-exercise blood glucose goals, signs, symptoms, and treatment of hypoglycemia and hyperglycemia, and foot care basics.   Diabetes Blitz:  -Group instruction provided by PowerPoint slides, verbal discussion, and written materials to support subject matter. The instructor gives an explanation and review of the physiology behind type 1 and type 2 diabetes, diabetes medications and rational behind using different medications, pre- and post-prandial blood glucose recommendations and Hemoglobin A1c goals, diabetes diet, and exercise including blood glucose guidelines for exercising safely.    Portion Distortion:  -Group instruction provided by PowerPoint slides, verbal discussion, written materials, and food models to support subject matter. The instructor gives an explanation of serving size versus portion size, changes in portions sizes over the last 20  years, and what consists of a serving from each food group.   Stress Management:  -Group instruction provided by verbal instruction, video, and written materials to support subject matter.  Instructors review role of stress in heart disease and how to cope with stress positively.     Exercising on Your Own:  -Group instruction provided by verbal instruction, power point, and written materials to support subject.  Instructors discuss benefits of exercise, components of exercise, frequency and intensity of exercise, and end points for exercise.  Also discuss use of nitroglycerin and activating EMS.  Review options of places to exercise outside of rehab.  Review guidelines for sex with heart disease.   Cardiac Drugs I:  -  Group instruction provided by verbal instruction and written materials to support subject.  Instructor reviews cardiac drug classes: antiplatelets, anticoagulants, beta blockers, and statins.  Instructor discusses reasons, side effects, and lifestyle considerations for each drug class.   Cardiac Drugs II:  -Group instruction provided by verbal instruction and written materials to support subject.  Instructor reviews cardiac drug classes: angiotensin converting enzyme inhibitors (ACE-I), angiotensin II receptor blockers (ARBs), nitrates, and calcium channel blockers.  Instructor discusses reasons, side effects, and lifestyle considerations for each drug class.   Anatomy and Physiology of the Circulatory System:  Group verbal and written instruction and models provide basic cardiac anatomy and physiology, with the coronary electrical and arterial systems. Review of: AMI, Angina, Valve disease, Heart Failure, Peripheral Artery Disease, Cardiac Arrhythmia, Pacemakers, and the ICD.   Other Education:  -Group or individual verbal, written, or video instructions that support the educational goals of the cardiac rehab program.   Holiday Eating Survival Tips:  -Group instruction  provided by PowerPoint slides, verbal discussion, and written materials to support subject matter. The instructor gives patients tips, tricks, and techniques to help them not only survive but enjoy the holidays despite the onslaught of food that accompanies the holidays.   Knowledge Questionnaire Score: Knowledge Questionnaire Score - 03/03/18 1101      Knowledge Questionnaire Score   Pre Score  20/24       Core Components/Risk Factors/Patient Goals at Admission: Personal Goals and Risk Factors at Admission - 03/03/18 1106      Core Components/Risk Factors/Patient Goals on Admission    Weight Management  Yes;Weight Maintenance    Intervention  Weight Management: Develop a combined nutrition and exercise program designed to reach desired caloric intake, while maintaining appropriate intake of nutrient and fiber, sodium and fats, and appropriate energy expenditure required for the weight goal.;Weight Management: Provide education and appropriate resources to help participant work on and attain dietary goals.;Weight Management/Obesity: Establish reasonable short term and long term weight goals.    Admit Weight  192 lb 14.4 oz (87.5 kg)    Expected Outcomes  Short Term: Continue to assess and modify interventions until short term weight is achieved;Long Term: Adherence to nutrition and physical activity/exercise program aimed toward attainment of established weight goal;Weight Maintenance: Understanding of the daily nutrition guidelines, which includes 25-35% calories from fat, 7% or less cal from saturated fats, less than 200mg  cholesterol, less than 1.5gm of sodium, & 5 or more servings of fruits and vegetables daily;Understanding recommendations for meals to include 15-35% energy as protein, 25-35% energy from fat, 35-60% energy from carbohydrates, less than 200mg  of dietary cholesterol, 20-35 gm of total fiber daily;Understanding of distribution of calorie intake throughout the day with the  consumption of 4-5 meals/snacks    Tobacco Cessation  Yes    Intervention  Assist the participant in steps to quit. Provide individualized education and counseling about committing to Tobacco Cessation, relapse prevention, and pharmacological support that can be provided by physician.;Advice worker, assist with locating and accessing local/national Quit Smoking programs, and support quit date choice.    Expected Outcomes  Short Term: Will demonstrate readiness to quit, by selecting a quit date.    Diabetes  Yes    Intervention  Provide education about signs/symptoms and action to take for hypo/hyperglycemia.;Provide education about proper nutrition, including hydration, and aerobic/resistive exercise prescription along with prescribed medications to achieve blood glucose in normal ranges: Fasting glucose 65-99 mg/dL    Expected Outcomes  Short Term: Participant verbalizes  understanding of the signs/symptoms and immediate care of hyper/hypoglycemia, proper foot care and importance of medication, aerobic/resistive exercise and nutrition plan for blood glucose control.;Long Term: Attainment of HbA1C < 7%.    Hypertension  Yes    Intervention  Provide education on lifestyle modifcations including regular physical activity/exercise, weight management, moderate sodium restriction and increased consumption of fresh fruit, vegetables, and low fat dairy, alcohol moderation, and smoking cessation.;Monitor prescription use compliance.    Expected Outcomes  Short Term: Continued assessment and intervention until BP is < 140/74mm HG in hypertensive participants. < 130/69mm HG in hypertensive participants with diabetes, heart failure or chronic kidney disease.;Long Term: Maintenance of blood pressure at goal levels.    Lipids  Yes    Intervention  Provide education and support for participant on nutrition & aerobic/resistive exercise along with prescribed medications to achieve LDL 70mg , HDL >40mg .     Expected Outcomes  Short Term: Participant states understanding of desired cholesterol values and is compliant with medications prescribed. Participant is following exercise prescription and nutrition guidelines.;Long Term: Cholesterol controlled with medications as prescribed, with individualized exercise RX and with personalized nutrition plan. Value goals: LDL < 70mg , HDL > 40 mg.    Stress  Yes    Intervention  Offer individual and/or small group education and counseling on adjustment to heart disease, stress management and health-related lifestyle change. Teach and support self-help strategies.;Refer participants experiencing significant psychosocial distress to appropriate mental health specialists for further evaluation and treatment. When possible, include family members and significant others in education/counseling sessions.    Expected Outcomes  Short Term: Participant demonstrates changes in health-related behavior, relaxation and other stress management skills, ability to obtain effective social support, and compliance with psychotropic medications if prescribed.;Long Term: Emotional wellbeing is indicated by absence of clinically significant psychosocial distress or social isolation.       Core Components/Risk Factors/Patient Goals Review:  Goals and Risk Factor Review    Row Name 03/18/18 1720 07/29/18 1355 08/11/18 1449         Core Components/Risk Factors/Patient Goals Review   Personal Goals Review  Weight Management/Obesity;Lipids;Hypertension;Diabetes;Tobacco Cessation  Weight Management/Obesity;Lipids;Hypertension;Diabetes;Tobacco Cessation  Weight Management/Obesity;Lipids;Hypertension;Diabetes;Tobacco Cessation     Review  Nyzier is off to a slow but good start to exercise. Zafar is checking his own CBG's at home. Patient given a meter. Vital signs stable. Continue to encourage smoking cessation  Durelle returned to exercise on Monday and has been doing well with exercise since his  return. Armond has gained a little weight since the COVID 19 pandemic. Blakeley is happy to be back at cardiac rehab.  30 Day ITP Review.  Pt has returned to exercise at [facility].  Aydien is currently admitted but was previously tolerating exercise well.  Pt's expected graduation date is 08/26/2018.  Will follow pt to ensure appropriateness to return to [facility].     Expected Outcomes  Diaz will continue to partcipate in cardiac rehab for nutrition, exercise and lifestyle modifications.  Agam will continue to partcipate in cardiac rehab for nutrition, exercise and lifestyle modifications.  Jakobie will continue to partcipate in cardiac rehab for nutrition, exercise, and lifestyle modifications.        Core Components/Risk Factors/Patient Goals at Discharge (Final Review):  Goals and Risk Factor Review - 08/11/18 1449      Core Components/Risk Factors/Patient Goals Review   Personal Goals Review  Weight Management/Obesity;Lipids;Hypertension;Diabetes;Tobacco Cessation    Review  30 Day ITP Review.  Pt has returned to exercise at [facility].  Kadarrius is currently admitted but was previously tolerating exercise well.  Pt's expected graduation date is 08/26/2018.  Will follow pt to ensure appropriateness to return to CR.    Expected Outcomes  Regan will continue to partcipate in cardiac rehab for nutrition, exercise, and lifestyle modifications.       ITP Comments: ITP Comments    Row Name 03/03/18 1024 03/18/18 1718 08/11/18 1434       ITP Comments  Dr. Fransico Him, Medical Director  30 Day ITP Review. Joandry is off to a good start to exercise.  30 Day ITP Review.  Pt has returned to exercise at [facility].  The patient is currently admitted but was previously tolerating exercise well.  Pt's expected graduation date is 08/26/2018.  Will follow pt to ensure appropriateness to return to CR.        Comments: See ITP Comments.

## 2018-08-11 NOTE — Transfer of Care (Signed)
Immediate Anesthesia Transfer of Care Note  Patient: Justin [name]  Procedure(s) Performed: ESOPHAGOGASTRODUODENOSCOPY (EGD) WITH PROPOFOL (N/A ) BIOPSY  Patient Location: Endoscopy Unit  Anesthesia Type:MAC  Level of Consciousness: drowsy  Airway & Oxygen Therapy: Patient Spontanous Breathing  Post-op Assessment: Report given to RN  Post vital signs: Reviewed and stable  Last Vitals:  Vitals Value Taken Time  BP    Temp    Pulse    Resp    SpO2      Last Pain:  Vitals:   08/11/18 0911  TempSrc:   PainSc: 0-No pain      Patients Stated Pain Goal: 0 (76/72/09 [id number])  Complications: No apparent anesthesia complications

## 2018-08-11 NOTE — Anesthesia Procedure Notes (Signed)
Procedure Name: MAC Date/Time: 08/11/2018 9:40 AM Performed by: Barrington Ellison, CRNA Pre-anesthesia Checklist: Patient identified, Emergency Drugs available, Suction available, Patient being monitored and Timeout performed Patient Re-evaluated:Patient Re-evaluated prior to induction Oxygen Delivery Method: Simple face mask

## 2018-08-11 NOTE — Anesthesia Preprocedure Evaluation (Addendum)
Anesthesia Evaluation  Patient identified by MRN, date of birth, ID band Patient awake    Reviewed: Allergy & Precautions, NPO status , Patient's Chart, lab work & pertinent test results  History of Anesthesia Complications Negative for: history of anesthetic complications  Airway Mallampati: II  TM Distance: >3 FB Neck ROM: Full    Dental  (+) Dental Advisory Given, Loose, Partial Upper, Partial Lower   Pulmonary sleep apnea , former smoker,    breath sounds clear to auscultation       Cardiovascular hypertension, Pt. on medications + CAD, + CABG and + Peripheral Vascular Disease  + dysrhythmias Atrial Fibrillation + Valvular Problems/Murmurs  Rhythm:Irregular Rate:Tachycardia   '20 TTE -  EF 60-65%. There is mildly increased left ventricular wall thickness. Left atrial size was moderately dilated. A 83m an EBig Lotsvalve is present in the aortic position. Aortic valve prosthesis appears to open well. Mild TR.    Neuro/Psych negative neurological ROS  negative psych ROS   GI/Hepatic Neg liver ROS, GERD  Medicated and Controlled,  Endo/Other  diabetes, Type 2, Oral Hypoglycemic Agents Obesity   Renal/GU negative Renal ROS     Musculoskeletal  (+) Arthritis ,   Abdominal   Peds  Hematology  (+) anemia ,  Thrombocytopenia Leukocytosis Hx polycythemia vera    Anesthesia Other Findings   Reproductive/Obstetrics                            Anesthesia Physical Anesthesia Plan  ASA: III  Anesthesia Plan: MAC   Post-op Pain Management:    Induction: Intravenous  PONV Risk Score and Plan: 1 and Propofol infusion and Treatment may vary due to age or medical condition  Airway Management Planned: Nasal Cannula and Natural Airway  Additional Equipment: None  Intra-op Plan:   Post-operative Plan:   Informed Consent: I have reviewed the patients History and Physical, chart,  labs and discussed the procedure including the risks, benefits and alternatives for the proposed anesthesia with the patient or authorized representative who has indicated his/her understanding and acceptance.       Plan Discussed with: CRNA and Anesthesiologist  Anesthesia Plan Comments:        Anesthesia Quick Evaluation

## 2018-08-11 NOTE — Telephone Encounter (Signed)
Pt called [facility] requesting call back. PC to pt.  Pt is currently admitted in hospital.  Franck relays that he will miss CR on Monday d/t previous appointment.  Requested patient to f/u with his PCP about his admission and to gain clearance to return to exercise.  Pt verbalized understanding.

## 2018-08-11 NOTE — Op Note (Addendum)
[facility] Patient Name: Justin [name] Procedure Date : 08/11/2018 MRN: [id number] Attending MD: Mauri Pole , MD Date of Birth: 1955-12-28 CSN: [id number] Age: 63 Admit Type: Inpatient Procedure:                Upper GI endoscopy Indications:              Active gastrointestinal bleeding with melena,                            Suspected upper gastrointestinal bleeding Providers:                Mauri Pole, MD, Glori Bickers, RN,                            William [name], Technician Referring MD:              Medicines:                Monitored Anesthesia Care Complications:            No immediate complications. Estimated Blood Loss:     Estimated blood loss was minimal. Procedure:                Pre-Anesthesia Assessment:                           - Prior to the procedure, a History and Physical                            was performed, and patient medications and                            allergies were reviewed. The patient's tolerance of                            previous anesthesia was also reviewed. The risks                            and benefits of the procedure and the sedation                            options and risks were discussed with the patient.                            All questions were answered, and informed consent                            was obtained. Prior Anticoagulants: The patient                            last took Eliquis (apixaban) 2 days prior to the                            procedure. ASA Grade Assessment: III - A patient  with severe systemic disease. After reviewing the                            risks and benefits, the patient was deemed in                            satisfactory condition to undergo the procedure.                           After obtaining informed consent, the endoscope was                            passed under direct vision. Throughout the     procedure, the patient's blood pressure, pulse, and                            oxygen saturations were monitored continuously. The                            [id number]-H190 ([id number]) Olympus gastroscope was                            introduced through the mouth, and advanced to the                            second part of duodenum. The upper GI endoscopy was                            accomplished without difficulty. The patient                            tolerated the procedure well. Scope In: Scope Out: Findings:      The Z-line was regular and was found 37 cm from the incisors.      No gross lesions were noted in the entire esophagus. Small hiatal hernia      Many non-obstructing non-bleeding cratered and linear gastric ulcers       with a clean ulcer base (Forrest Class III) were found in the gastric       fundus. The largest lesion was 10 mm in largest dimension. Biopsies were       taken with a cold forceps for histology. Biopsies were taken with a cold       forceps for Helicobacter pylori testing.      The examined duodenum was normal. Impression:               - Z-line regular, 37 cm from the incisors.                           - No gross lesions in esophagus.                           - Non-obstructing non-bleeding gastric ulcers with                            a clean ulcer base (Forrest Class  III). Biopsied.                           - Normal examined duodenum. Recommendation:           - Patient has a contact number available for                            emergencies. The signs and symptoms of potential                            delayed complications were discussed with the                            patient. Return to normal activities tomorrow.                            Written discharge instructions were provided to the                            patient.                           - Resume previous diet.                           - Continue present medications.                            - Use Protonix (pantoprazole) 40 mg PO BID for 3                            months followed by once daily.                           - Resume Eliquis (apixaban) at prior dose in 3                            days. Refer to managing physician for further                            adjustment of therapy.                           - No high dose aspirin, ibuprofen, naproxen, or                            other non-steroidal anti-inflammatory drugs.                           - Await pathology results.                           - Discontinue Hydroxyurea, profound anemia,                            thrmobocytopenia likely secondary to  myelosuppression.                           - Follow up with Hematology as outpatient.                           - Repeat CBC next week on discharge, follow up with                            PMD                           - GI follow up next available in 2-4 weeks                           - We will sign off, available if have any questions Procedure Code(s):        --- Professional ---                           [phone], Esophagogastroduodenoscopy, flexible,                            transoral; with biopsy, single or multiple Diagnosis Code(s):        --- Professional ---                           K25.9, Gastric ulcer, unspecified as acute or                            chronic, without hemorrhage or perforation                           K92.2, Gastrointestinal hemorrhage, unspecified CPT copyright 2019 [employer]. All rights reserved. The codes documented in this report are preliminary and upon coder review may  be revised to meet current compliance requirements. Mauri Pole, MD 08/11/2018 10:28:34 AM This report has been signed electronically. Number of Addenda: 0

## 2018-08-11 NOTE — Interval H&P Note (Signed)
History and Physical Interval Note:  08/11/2018 9:31 AM  Justin [name]  has presented today for surgery, with the diagnosis of FOBT + black stool, transusion requiring anemia.  The various methods of treatment have been discussed with the patient and family. After consideration of risks, benefits and other options for treatment, the patient has consented to  Procedure(s): ESOPHAGOGASTRODUODENOSCOPY (EGD) WITH PROPOFOL (N/A) as a surgical intervention.  The patient's history has been reviewed, patient examined, no change in status, stable for surgery.  I have reviewed the patient's chart and labs.  Questions were answered to the patient's satisfaction.     Kavitha Nandigam

## 2018-08-11 NOTE — Progress Notes (Signed)
PROGRESS NOTE  Justin [name] JJK:[id number] DOB: 1955-08-12 DOA: 08/09/2018 PCP: [name], MD  HPI/Recap of past 24 hours: Justin [name] a 63 y.o.malewith medical history significant ofAS, A.Fib on eliquis, DM2, HTN, CAD s/p CABG.  Patient presents to ED via EMS for near syncope. Has been intermittently lightheaded and dizzy for past few days, especially with position change and ambulation. This evening felt like he may pass out.  911 called. Per EMS patient 37J systolic, given 696 cc IVF en route with improvement.  No NSAID use. Did have coffee ground emesis last month but GI thought this was just reflux and his HGB seemed stable as of end of June (11.2). Also seemed to have been improving on PPI.  Has had intermittent dark tarry stool.  No fevers, chills, abd pain.  ED Course:HGB 4.2 down from 11.2 at end of June. Baseline runs about 11. He is also on chronic hydrea for treatment of PCV.  Hemoccult positive.  08/10/18: Patient was seen and examined at his bedside this morning.  Reports last bowel movement was yesterday morning formed and black stools.  Denies any abdominal pain or nausea.  Hemoglobin dropped to 3.4 this morning added 2 unit PRBC for total 4 unit PRBC transfusion.  Currently on Protonix drip.  GI has been consulted to further assess and will see in consultation.  Highly appreciated.  08/11/18: Patient was seen and examined at his bedside this morning.  States he feels better post 4 units PRBC transfusion.  Was dyspneic last night.  Chest x-ray done independently viewed showed mild increase in pulmonary vascularity.  Given 20 mg IV Lasix once with improvement.  Assessment/Plan: Principal Problem:   GI bleed Active Problems:   Essential hypertension   Atrial fibrillation (HCC)   Polycythemia vera (HCC)   Symptomatic anemia   Acute blood loss anemia   Fatty liver   Melena   Acute gastric ulcer with hemorrhage  Near-syncope  likely secondary to profound anemia, multifactorial secondary to upper GI bleed from non bleeding gastric ulcers found on EGD done on 08/11/2018 versus myelosuppression on hydroxyurea EGD done, recommendations per GI.  Highly appreciated.  GI signed off 08/11/2018. Continue PPI Protonix p.o. 40 mg twice daily Resume diet PT OT to assess  Nonbleeding gastric ulcers with profound anemia Presented with profound anemia with hemoglobin as low as 3.4 Post 4 unit PRBC transfusion on 08/10/2018 Hemoglobin 8.5 on 08/11/2018 from 8.8 on 08/10/2018. Repeat H&H every 12 hours Monitor for signs of overt bleeding Will need to be closely monitored inpatient for another midnight.  Will discharge tomorrow if hemoglobin remains stable.  Mild pulmonary edema likely secondary to multiple blood transfusions Given IV Lasix 20 mg once Maintaining O2 saturation greater than 92%  Physical debility PT to assess Fall precautions  Hyperlipidemia Continue Crestor  Type 2 diabetes Hemoglobin A1c 4.9 on 08/10/2018 Hold oral anti-glycemic  Chronic A. fib Rate controlled On Eliquis for CVA prevention Resume Eliquis in 3 days as recommended by GI due to upper GI bleed with nonbleeding gastric ulcers.  Hypertension/tachycardia Resume metoprolol XL reduced dose to 12.5 mg daily to avoid hypotension Continue to monitor vital signs  Polycythemia vera Continue to hold hydroxyurea due to bone marrow suppression contributing to anemia  Risks: Patient is high risk for decompensation due to recent profound anemia in the setting of upper GI bleed requiring close monitoring of H&H, multiple comorbidities and advanced age.  Patient will require at least 2 midnights for further evaluation  and treatment of present condition.   DVT prophylaxis: SCDs Code Status: Full Family Communication: No family in room Disposition Plan:  DC to home possibly tomorrow 08/12/2018 when hemodynamically stable and hemoglobin is  stable. Consults called: GI    Objective: Vitals:   08/11/18 1020 08/11/18 1026 08/11/18 1030 08/11/18 1050  BP:  (!) 134/55  (!) 145/74  Pulse: 98 (!) 127 (!) 109 (!) 140  Resp: (!) 30 (!) 30 (!) 22 (!) 24  Temp:    98.1 F (36.7 C)  TempSrc:    Oral  SpO2:  94% 96% 98%  Weight:      Height:        Intake/Output Summary (Last 24 hours) at 08/11/2018 1304 Last data filed at 08/11/2018 0800 Gross per 24 hour  Intake 484.47 ml  Output 1350 ml  Net -865.53 ml   Filed Weights   08/09/18 2300 08/10/18 0325 08/11/18 0413  Weight: 89.8 kg 90.3 kg 91.4 kg    Exam:   General: 63 y.o. year-old male well developed well nourished in no acute distress.  Alert and oriented x3.  Cardiovascular: Regular rate and rhythm with no rubs or gallops.  No thyromegaly or JVD noted.    Respiratory: Mild rales at bases no wheezes noted.  Good respiratory effort.  Abdomen: Soft nontender nondistended with normal bowel sounds x4 quadrants.  Musculoskeletal: No lower extremity edema. 2/4 pulses in all 4 extremities.  Psychiatry: Mood is appropriate for condition and setting   Data Reviewed: CBC: Recent Labs  Lab 08/09/18 2342 08/10/18 0140 08/10/18 1635 08/10/18 2315 08/11/18 0638  WBC 15.6* 17.2* 18.3*  --   --   HGB 4.2* 3.4* 8.5* 8.8* 8.5*  HCT 13.6* 11.1* 24.5* 25.7* 24.9*  MCV 127.1* 126.1* 98.0  --   --   PLT 142* 154 122*  --   --    Basic Metabolic Panel: Recent Labs  Lab 08/09/18 2342 08/10/18 0140  NA 137 136  K 4.3 4.4  CL 108 107  CO2 18* 19*  GLUCOSE 129* 118*  BUN 24* 24*  CREATININE 0.99 0.99  CALCIUM 8.3* 8.3*   GFR: Estimated Creatinine Clearance: 82.7 mL/min (by C-G formula based on SCr of 0.99 mg/dL). Liver Function Tests: Recent Labs  Lab 08/09/18 2342 08/10/18 0140  AST 84* 120*  ALT 55* 81*  ALKPHOS 57 64  BILITOT 0.8 0.9  PROT 5.4* 5.2*  ALBUMIN 2.8* 2.7*   No results for input(s): LIPASE, AMYLASE in the last 168 hours. No results for  input(s): AMMONIA in the last 168 hours. Coagulation Profile: Recent Labs  Lab 08/10/18 0014 08/10/18 1635  INR 1.9* 1.6*   Cardiac Enzymes: No results for input(s): CKTOTAL, CKMB, CKMBINDEX, TROPONINI in the last 168 hours. BNP (last 3 results) No results for input(s): PROBNP in the last 8760 hours. HbA1C: Recent Labs    08/10/18 0140  HGBA1C 4.9   CBG: Recent Labs  Lab 08/10/18 2356 08/11/18 0411 08/11/18 0736 08/11/18 0906 08/11/18 1103  GLUCAP 130* 123* 115* 111* 104*   Lipid Profile: No results for input(s): CHOL, HDL, LDLCALC, TRIG, CHOLHDL, LDLDIRECT in the last 72 hours. Thyroid Function Tests: No results for input(s): TSH, T4TOTAL, FREET4, T3FREE, THYROIDAB in the last 72 hours. Anemia Panel: No results for input(s): VITAMINB12, FOLATE, FERRITIN, TIBC, IRON, RETICCTPCT in the last 72 hours. Urine analysis:    Component Value Date/Time   COLORURINE YELLOW 12/30/2017 [place] 12/30/2017 1112   LABSPEC 1.010 12/30/2017  [place] 6.0 12/30/2017 1112   GLUCOSEU NEGATIVE 12/30/2017 1112   HGBUR NEGATIVE 12/30/2017 1112   BILIRUBINUR NEGATIVE 12/30/2017 1112   KETONESUR NEGATIVE 12/30/2017 1112   PROTEINUR NEGATIVE 12/30/2017 1112   NITRITE NEGATIVE 12/30/2017 1112   LEUKOCYTESUR NEGATIVE 12/30/2017 1112   Sepsis Labs: @LABRCNTIP (procalcitonin:4,lacticidven:4)  ) Recent Results (from the past 240 hour(s))  SARS Coronavirus 2 (CEPHEID - Performed in [facility] hospital lab), Hosp Order     Status: None   Collection Time: 08/10/18  1:02 AM   Specimen: Nasopharyngeal Swab  Result Value Ref Range Status   SARS Coronavirus 2 NEGATIVE NEGATIVE Final    Comment: (NOTE) If result is NEGATIVE SARS-CoV-2 target nucleic acids are NOT DETECTED. The SARS-CoV-2 RNA is generally detectable in upper and lower  respiratory specimens during the acute phase of infection. The lowest  concentration of SARS-CoV-2 viral copies this assay can detect is  250  copies / mL. A negative result does not preclude SARS-CoV-2 infection  and should not be used as the sole basis for treatment or other  patient management decisions.  A negative result may occur with  improper specimen collection / handling, submission of specimen other  than nasopharyngeal swab, presence of viral mutation(s) within the  areas targeted by this assay, and inadequate number of viral copies  (<250 copies / mL). A negative result must be combined with clinical  observations, patient history, and epidemiological information. If result is POSITIVE SARS-CoV-2 target nucleic acids are DETECTED. The SARS-CoV-2 RNA is generally detectable in upper and lower  respiratory specimens dur ing the acute phase of infection.  Positive  results are indicative of active infection with SARS-CoV-2.  Clinical  correlation with patient history and other diagnostic information is  necessary to determine patient infection status.  Positive results do  not rule out bacterial infection or co-infection with other viruses. If result is PRESUMPTIVE POSTIVE SARS-CoV-2 nucleic acids MAY BE PRESENT.   A presumptive positive result was obtained on the submitted specimen  and confirmed on repeat testing.  While 2019 novel coronavirus  (SARS-CoV-2) nucleic acids may be present in the submitted sample  additional confirmatory testing may be necessary for epidemiological  and / or clinical management purposes  to differentiate between  SARS-CoV-2 and other Sarbecovirus currently known to infect humans.  If clinically indicated additional testing with an alternate test  methodology [id number]) is advised. The SARS-CoV-2 RNA is generally  detectable in upper and lower respiratory sp ecimens during the acute  phase of infection. The expected result is Negative. Fact Sheet for Patients:  StrictlyIdeas.no Fact Sheet for Healthcare  Providers: BankingDealers.co.za This test is not yet approved or cleared by the [place] FDA and has been authorized for detection and/or diagnosis of SARS-CoV-2 by FDA under an Emergency Use Authorization (EUA).  This EUA will remain in effect (meaning this test can be used) for the duration of the COVID-19 declaration under Section 564(b)(1) of the Act, 21 U.S.C. section 360bbb-3(b)(1), unless the authorization is terminated or revoked sooner. Performed at [facility], [place] [address], [place], [place] [address]       Studies: US Abdomen Complete  Result Date: 08/10/2018 CLINICAL DATA:  63 year old male with fatty liver hypertension and diabetes. EXAM: ABDOMEN ULTRASOUND COMPLETE COMPARISON:  CT of the abdomen pelvis dated 06/20/2010 FINDINGS: Evaluation is limited due to body habitus. Gallbladder: There is sludge or small stones within the gallbladder. No gallbladder wall thickening or pericholecystic fluid. Negative sonographic  Murphy's sign. Common bile duct: Diameter: 2 mm Liver: Mildly increased liver echogenicity most consistent with fatty infiltration. Superimposed inflammation or fibrosis is not excluded. There is apparent slight irregularity of the liver contour which may represent early changes of cirrhosis. Clinical correlation is recommended. Portal vein is patent on color Doppler imaging with normal direction of blood flow towards the liver. IVC: No abnormality visualized. Pancreas: Visualized portion unremarkable. Spleen: Top normal measuring 13 cm in length. Right Kidney: Length: 11 cm. Normal echogenicity. No hydronephrosis or shadowing stone. Left Kidney: Length: 13 cm. Normal echogenicity. No hydronephrosis or shadowing stone. Abdominal aorta: No aneurysm visualized. Other findings: No ascites identified. IMPRESSION: 1. Gallbladder sludge versus tiny stones without sonographic findings of acute cholecystitis. 2. Probable mild fatty liver. 3. No  ascites. Electronically Signed   By: Anner Crete M.D.   On: 08/10/2018 19:39   Dg Chest Port 1 View  Result Date: 08/10/2018 CLINICAL DATA:  Shortness of breath EXAM: PORTABLE CHEST 1 VIEW COMPARISON:  08/09/2018 FINDINGS: Cardiac shadow remains enlarged. Postsurgical changes are again seen. The lungs are well aerated bilaterally. Diffuse increased vascular congestion is noted when compare with the prior study. Some mild edema is noted on the right. No focal confluent infiltrate is seen. IMPRESSION: Increasing shortness of breath with mild parenchymal edema. Electronically Signed   By: Inez Catalina M.D.   On: 08/10/2018 23:01    Scheduled Meds:  sodium chloride   Intravenous Once   cholecalciferol  5,000 Units Oral Daily   cyclobenzaprine  10 mg Oral TID   fluticasone  2 spray Each Nare Daily   insulin aspart  0-9 Units Subcutaneous Q4H   pantoprazole  40 mg Oral BID AC   pregabalin  50 mg Oral TID   rosuvastatin  20 mg Oral Daily   zolpidem  10 mg Oral QHS    Continuous Infusions:   LOS: 1 day     Kayleen Memos, MD [facility] Pager [phone]  If 7PM-7AM, please contact night-coverage [email or web address] Password [email or web address] 08/11/2018, 1:04 PM

## 2018-08-12 ENCOUNTER — Encounter (HOSPITAL_COMMUNITY): Payer: BC Managed Care – PPO

## 2018-08-12 ENCOUNTER — Encounter: Payer: Self-pay | Admitting: Cardiology

## 2018-08-12 LAB — CBC
HCT: 23.2 % — ABNORMAL LOW (ref 39.0–52.0)
Hemoglobin: 8 g/dL — ABNORMAL LOW (ref 13.0–17.0)
MCH: 34.2 pg — ABNORMAL HIGH (ref 26.0–34.0)
MCHC: 34.5 g/dL (ref 30.0–36.0)
MCV: 99.1 fL (ref 80.0–100.0)
Platelets: 124 10*3/uL — ABNORMAL LOW (ref 150–400)
RBC: 2.34 MIL/uL — ABNORMAL LOW (ref 4.22–5.81)
RDW: 22.6 % — ABNORMAL HIGH (ref 11.5–15.5)
WBC: 12.5 10*3/uL — ABNORMAL HIGH (ref 4.0–10.5)
nRBC: 1 % — ABNORMAL HIGH (ref 0.0–0.2)

## 2018-08-12 LAB — GLUCOSE, CAPILLARY
Glucose-Capillary: 130 mg/dL — ABNORMAL HIGH (ref 70–99)
Glucose-Capillary: 99 mg/dL (ref 70–99)
Glucose-Capillary: 134 mg/dL — ABNORMAL HIGH (ref 70–99)
Glucose-Capillary: 118 mg/dL — ABNORMAL HIGH (ref 70–99)

## 2018-08-12 LAB — HEMOGLOBIN AND HEMATOCRIT, BLOOD
HCT: 24.5 % — ABNORMAL LOW (ref 39.0–52.0)
Hemoglobin: 8.2 g/dL — ABNORMAL LOW (ref 13.0–17.0)

## 2018-08-12 NOTE — Evaluation (Deleted)
Physical Therapy Evaluation Patient Details Name: Justin [name] MRN: [id number] DOB: 11-29-1955 Today's Date: 08/12/2018   History of Present Illness  Pt is a 63 y/o male admitted secondary to chest pain and hypoclycemia. PMH includes DM, HTN, and cervical surgery.   Clinical Impression  Pt admitted secondary to problem above with deficits below. Pt requiring min guard A for mobility to chair this session using RW. Pt reports he has been using WC for primary mobility and using RW to perform short distance ambulation occasionally. Pt reports he has also been working with [place]; recommend resuming HHPT. Will continue to follow acutely to maximize functional mobility independence and safety.     Follow Up Recommendations Home health PT;Supervision for mobility/OOB    Equipment Recommendations  None recommended by PT    Recommendations for Other Services       Precautions / Restrictions Precautions Precautions: Fall Restrictions Weight Bearing Restrictions: No      Mobility  Bed Mobility Overal bed mobility: Needs Assistance Bed Mobility: Supine to Sit     Supine to sit: Supervision     General bed mobility comments: Supervision for safety.   Transfers Overall transfer level: Needs assistance Equipment used: Rolling walker (2 wheeled) Transfers: Sit to/from [facility] Sit to Stand: Min guard;From elevated surface Stand pivot transfers: Min guard       General transfer comment: Min guard A to stand from elevated surface. Min guard A for safety to transfer to chair with RW.   Ambulation/Gait                Stairs            Wheelchair Mobility    Modified Rankin (Stroke Patients Only)       Balance Overall balance assessment: Needs assistance Sitting-balance support: No upper extremity supported;Feet supported Sitting balance-Leahy Scale: Good     Standing balance support: Bilateral upper extremity supported;During functional  activity Standing balance-Leahy Scale: Poor Standing balance comment: Reliant on BUE support                              Pertinent Vitals/Pain Pain Assessment: No/denies pain    Home Living Family/patient expects to be discharged to:: Private residence Living Arrangements: Spouse/significant other Available Help at Discharge: Family;Available 24 hours/day Type of Home: House Home Access: Stairs to enter   Entrance Stairs-Number of Steps: 1(threshold) Home Layout: One level Home Equipment: Walker - 2 wheels;Wheelchair - manual      Prior Function Level of Independence: Needs assistance   Gait / Transfers Assistance Needed: Uses WC for primary mobility and occasionally using RW for ambulation for short distances. Has been working with [place].   ADL's / Homemaking Assistance Needed: Pt reports he sponge bathes and sometimes requires assist.         Hand Dominance        Extremity/Trunk Assessment   Upper Extremity Assessment Upper Extremity Assessment: Generalized weakness    Lower Extremity Assessment Lower Extremity Assessment: Generalized weakness    Cervical / Trunk Assessment Cervical / Trunk Assessment: Other exceptions Cervical / Trunk Exceptions: recent cervical surgery   Communication   Communication: No difficulties  Cognition Arousal/Alertness: Awake/alert Behavior During Therapy: WFL for tasks assessed/performed Overall Cognitive Status: Within Functional Limits for tasks assessed  General Comments      Exercises     Assessment/Plan    PT Assessment Patient needs continued PT services  PT Problem List Decreased strength;Decreased balance;Decreased mobility;Decreased knowledge of use of DME;Decreased knowledge of precautions       PT Treatment Interventions DME instruction;Gait training;Functional mobility training;Stair training;Therapeutic activities;Therapeutic  exercise;Balance training;Patient/family education    PT Goals (Current goals can be found in the Care Plan section)  Acute Rehab PT Goals Patient Stated Goal: to go home PT Goal Formulation: With patient Time For Goal Achievement: 08/26/18 Potential to Achieve Goals: Good    Frequency Min 3X/week   Barriers to discharge        Co-evaluation               AM-PAC PT "6 Clicks" Mobility  Outcome Measure Help needed turning from your back to your side while in a flat bed without using bedrails?: None Help needed moving from lying on your back to sitting on the side of a flat bed without using bedrails?: None Help needed moving to and from a bed to a chair (including a wheelchair)?: A Little Help needed standing up from a chair using your arms (e.g., wheelchair or bedside chair)?: A Little Help needed to walk in hospital room?: A Little Help needed climbing 3-5 steps with a railing? : A Lot 6 Click Score: 19    End of Session Equipment Utilized During Treatment: Gait belt Activity Tolerance: Patient tolerated treatment well Patient left: in chair;with call bell/phone within reach Nurse Communication: Mobility status PT Visit Diagnosis: Unsteadiness on feet (R26.81);Muscle weakness (generalized) (M62.81);Difficulty in walking, not elsewhere classified (R26.2)    Time: 5956-3875 PT Time Calculation (min) (ACUTE ONLY): 15 min   Charges:   PT Evaluation $PT Eval Low Complexity: [place], PT, DPT  Acute Rehabilitation Services  Pager: [phone] Office: [phone]   Rudean Hitt 08/12/2018, 12:53 PM

## 2018-08-12 NOTE — Progress Notes (Signed)
PROGRESS NOTE  Samie Reasons Hotard EYC:[id number] DOB: 08-Mar-1955 DOA: 08/09/2018 PCP: [name], MD  HPI/Recap of past 24 hours: Zebedee Iba Perperis a 63 y.o.malewith medical history significant ofAS, A.Fib on eliquis, DM2, HTN, CAD s/p CABG.  Patient presents to ED via EMS for near syncope. Has been intermittently lightheaded and dizzy for past few days, especially with position change and ambulation. This evening felt like he may pass out.  911 called. Per EMS patient 14H systolic, given 702 cc IVF en route with improvement.  No NSAID use. Did have coffee ground emesis last month but GI thought this was just reflux and his HGB seemed stable as of end of June (11.2). Also seemed to have been improving on PPI.  Has had intermittent dark tarry stool.  No fevers, chills, abd pain.  ED Course:HGB 4.2 down from 11.2 at end of June. Baseline runs about 11. He is also on chronic hydrea for treatment of PCV.  Hemoccult positive.  Hemoglobin dropped to 3.4 on 08/10/2018.  Initially on Protonix drip then transitioned to oral protonix 40 mg twice daily.  EGD done on 08/11/2018 showed nonbleeding gastric ulcer with clean base.  08/12/18: Patient was seen and examined at his bedside.  He states he feels better today.  Denies any chest pain, shortness of breath or palpitations. Denies dizziness.  Hemoglobin dropped to 8.0 this morning from 8.8 post 4 unit PRBCs transfusion.  Not on IV fluid, so not dilutional.  Reports dark stool this morning.  Will repeat H&H at 1700, will transfuse if hemoglobin less than 8.0.  Assessment/Plan: Principal Problem:   GI bleed Active Problems:   Essential hypertension   Atrial fibrillation (HCC)   Polycythemia vera (HCC)   Symptomatic anemia   Acute blood loss anemia   Fatty liver   Melena   Acute gastric ulcer with hemorrhage  Near-syncope likely secondary to profound anemia, multifactorial secondary to upper GI bleed from non bleeding  gastric ulcers found on EGD done on 08/11/2018 versus myelosuppression on hydroxyurea EGD done on 08/10/2008 revealed nonbleeding gastric ulcer with clean base. GI signed off 08/11/2018. Continue PPI Protonix p.o. 40 mg twice daily for 3 months followed by once daily as recommended by GI Continue diet Resume Eliquis home 08/14/2018. No high-dose aspirin, ibuprofen, naproxen or other NSAIDs Awaiting results of the pathology Discontinue hydroxyurea due to bone marrow suppression With follow-up with Dr. Maylon Peppers hematologist outpatient Repeat CBC next week on discharge Will follow-up with PCP posthospitalization  Profound anemia in the setting of upper GI bleed Presented with hemoglobin as low as 3.4 Baseline hemoglobin 12.4 from 02/17/2018. Transfuse 4 units PRBCs Gradual drop in hemoglobin from 8.8 posttransfusion to 8.0 this morning Repeat H&H every 12 hours starting at 1700 Dark stools reported this morning Transfuse with hemoglobin less than 8.0  Nonbleeding gastric ulcers with profound anemia Presented with profound anemia with hemoglobin as low as 3.4 Post 4 unit PRBC transfusion on 08/10/2018 Hemoglobin 8.5 on 08/11/2018 from 8.8 on 08/10/2018. Repeat H&H every 12 hours Monitor for signs of overt bleeding We will need to be closely monitored inpatient for another midnight.  We will discharge tomorrow 08/13/2018 if hemoglobin is stable.  Mild pulmonary edema likely secondary to multiple blood transfusions Given IV Lasix 20 mg once Maintaining O2 saturation greater than 92%  Physical debility Pending PT assessment  Hyperlipidemia Continue Crestor  Type 2 diabetes Hemoglobin A1c 4.9 on 08/10/2018 Hold oral anti-glycemic  Chronic A. fib Rate controlled On Eliquis for CVA  prevention Resume Eliquis in 3 days as recommended by GI due to upper GI bleed with nonbleeding gastric ulcers.  Hypertension/tachycardia Continue metoprolol XL reduced dose to 12.5 mg daily to avoid hypotension  Continue to monitor vital signs  Polycythemia vera Continue to hold hydroxyurea due to bone marrow suppression contributing to anemia  Risks: Patient is high risk for decompensation due to recent profound anemia with hemoglobin of 3.4 (baseline hemoglobin 12) in the setting of upper GI bleed requiring close monitoring of H&H, multiple comorbidities and advanced age.  Patient will require at least 2 midnights for further evaluation and treatment of present condition.   DVT prophylaxis: SCDs Code Status: Full Family Communication: No family in room Disposition Plan:  DC to home possibly tomorrow 08/12/2018 when hemodynamically stable and hemoglobin is stable. Consults called: GI    Objective: Vitals:   08/11/18 1723 08/11/18 2000 08/11/18 2030 08/12/18 0547  BP: (!) 154/51  (!) 150/62 (!) 115/50  Pulse: (!) 114 (!) 136 (!) 107 88  Resp:  (!) 23 (!) 22 (!) 21  Temp:   98.7 F (37.1 C) (!) 97.5 F (36.4 C)  TempSrc:   Oral Oral  SpO2:  96% 100% 95%  Weight:    84.6 kg  Height:        Intake/Output Summary (Last 24 hours) at 08/12/2018 0915 Last data filed at 08/11/2018 2100 Gross per 24 hour  Intake 720 ml  Output -  Net 720 ml   Filed Weights   08/10/18 0325 08/11/18 0413 08/12/18 0547  Weight: 90.3 kg 91.4 kg 84.6 kg    Exam:  . General: 63 y.o. year-old male well-developed well-nourished.  No acute distress.  Alert and oriented x3.   . Cardiovascular: Regular rate and rhythm.  No rubs or gallops.  No JVD or thyromegaly noted.  Vertical scar from prior CABG. Marland Kitchen Respiratory: Clear to auscultation.  No wheezes.  Poor inspiratory effort. . Abdomen: Soft nontender nondistended normal bowel sounds present.. . Musculoskeletal: No lower extremity edema.  2 out of 4 pulses in all 4 extremities. Marland Kitchen Psychiatry: Mood is appropriate for condition and setting.   Data Reviewed: CBC: Recent Labs  Lab 08/09/18 2342 08/10/18 0140 08/10/18 1635 08/10/18 2315 08/11/18 0638  08/11/18 1746 08/12/18 0559  WBC 15.6* 17.2* 18.3*  --   --   --  12.5*  HGB 4.2* 3.4* 8.5* 8.8* 8.5* 8.3* 8.0*  HCT 13.6* 11.1* 24.5* 25.7* 24.9* 24.4* 23.2*  MCV 127.1* 126.1* 98.0  --   --   --  99.1  PLT 142* 154 122*  --   --   --  924*   Basic Metabolic Panel: Recent Labs  Lab 08/09/18 2342 08/10/18 0140  NA 137 136  K 4.3 4.4  CL 108 107  CO2 18* 19*  GLUCOSE 129* 118*  BUN 24* 24*  CREATININE 0.99 0.99  CALCIUM 8.3* 8.3*   GFR: Estimated Creatinine Clearance: 79.9 mL/min (by C-G formula based on SCr of 0.99 mg/dL). Liver Function Tests: Recent Labs  Lab 08/09/18 2342 08/10/18 0140  AST 84* 120*  ALT 55* 81*  ALKPHOS 57 64  BILITOT 0.8 0.9  PROT 5.4* 5.2*  ALBUMIN 2.8* 2.7*   No results for input(s): LIPASE, AMYLASE in the last 168 hours. No results for input(s): AMMONIA in the last 168 hours. Coagulation Profile: Recent Labs  Lab 08/10/18 0014 08/10/18 1635  INR 1.9* 1.6*   Cardiac Enzymes: No results for input(s): CKTOTAL, CKMB, CKMBINDEX, TROPONINI in the  last 168 hours. BNP (last 3 results) No results for input(s): PROBNP in the last 8760 hours. HbA1C: Recent Labs    08/10/18 0140  HGBA1C 4.9   CBG: Recent Labs  Lab 08/11/18 0906 08/11/18 1103 08/11/18 1552 08/11/18 2058 08/12/18 0748  GLUCAP 111* 104* 118* 140* 130*   Lipid Profile: No results for input(s): CHOL, HDL, LDLCALC, TRIG, CHOLHDL, LDLDIRECT in the last 72 hours. Thyroid Function Tests: No results for input(s): TSH, T4TOTAL, FREET4, T3FREE, THYROIDAB in the last 72 hours. Anemia Panel: No results for input(s): VITAMINB12, FOLATE, FERRITIN, TIBC, IRON, RETICCTPCT in the last 72 hours. Urine analysis:    Component Value Date/Time   COLORURINE YELLOW 12/30/2017 [place] 12/30/2017 1112   LABSPEC 1.010 12/30/2017 1112   PHURINE 6.0 12/30/2017 1112   GLUCOSEU NEGATIVE 12/30/2017 1112   HGBUR NEGATIVE 12/30/2017 1112   BILIRUBINUR NEGATIVE 12/30/2017  1112   KETONESUR NEGATIVE 12/30/2017 1112   PROTEINUR NEGATIVE 12/30/2017 1112   NITRITE NEGATIVE 12/30/2017 1112   LEUKOCYTESUR NEGATIVE 12/30/2017 1112   Sepsis Labs: @LABRCNTIP (procalcitonin:4,lacticidven:4)  ) Recent Results (from the past 240 hour(s))  SARS Coronavirus 2 (CEPHEID - Performed in [facility] hospital lab), Hosp Order     Status: None   Collection Time: 08/10/18  1:02 AM   Specimen: Nasopharyngeal Swab  Result Value Ref Range Status   SARS Coronavirus 2 NEGATIVE NEGATIVE Final    Comment: (NOTE) If result is NEGATIVE SARS-CoV-2 target nucleic acids are NOT DETECTED. The SARS-CoV-2 RNA is generally detectable in upper and lower  respiratory specimens during the acute phase of infection. The lowest  concentration of SARS-CoV-2 viral copies this assay can detect is 250  copies / mL. A negative result does not preclude SARS-CoV-2 infection  and should not be used as the sole basis for treatment or other  patient management decisions.  A negative result may occur with  improper specimen collection / handling, submission of specimen other  than nasopharyngeal swab, presence of viral mutation(s) within the  areas targeted by this assay, and inadequate number of viral copies  (<250 copies / mL). A negative result must be combined with clinical  observations, patient history, and epidemiological information. If result is POSITIVE SARS-CoV-2 target nucleic acids are DETECTED. The SARS-CoV-2 RNA is generally detectable in upper and lower  respiratory specimens dur ing the acute phase of infection.  Positive  results are indicative of active infection with SARS-CoV-2.  Clinical  correlation with patient history and other diagnostic information is  necessary to determine patient infection status.  Positive results do  not rule out bacterial infection or co-infection with other viruses. If result is PRESUMPTIVE POSTIVE SARS-CoV-2 nucleic acids MAY BE PRESENT.   A  presumptive positive result was obtained on the submitted specimen  and confirmed on repeat testing.  While 2019 novel coronavirus  (SARS-CoV-2) nucleic acids may be present in the submitted sample  additional confirmatory testing may be necessary for epidemiological  and / or clinical management purposes  to differentiate between  SARS-CoV-2 and other Sarbecovirus currently known to infect humans.  If clinically indicated additional testing with an alternate test  methodology [phone]) is advised. The SARS-CoV-2 RNA is generally  detectable in upper and lower respiratory sp ecimens during the acute  phase of infection. The expected result is Negative. Fact Sheet for Patients:  StrictlyIdeas.no Fact Sheet for Healthcare Providers: BankingDealers.co.za This test is not yet approved or cleared by the [place] FDA and has been authorized  for detection and/or diagnosis of SARS-CoV-2 by FDA under an Emergency Use Authorization (EUA).  This EUA will remain in effect (meaning this test can be used) for the duration of the COVID-19 declaration under Section 564(b)(1) of the Act, 21 U.S.C. section 360bbb-3(b)(1), unless the authorization is terminated or revoked sooner. Performed at [facility], [place] [address], [place], [place] [address]       Studies: No results found.  Scheduled Meds: . sodium chloride   Intravenous Once  . cholecalciferol  5,000 Units Oral Daily  . cyclobenzaprine  10 mg Oral TID  . fluticasone  2 spray Each Nare Daily  . insulin aspart  0-15 Units Subcutaneous TID WC  . insulin aspart  0-5 Units Subcutaneous QHS  . metoprolol succinate  12.5 mg Oral Daily  . pantoprazole  40 mg Oral BID AC  . pregabalin  50 mg Oral TID  . rosuvastatin  20 mg Oral Daily  . zolpidem  10 mg Oral QHS    Continuous Infusions:   LOS: 2 days     Kayleen Memos, MD [facility] Pager [phone]  If 7PM-7AM,  please contact night-coverage [email or web address] Password [email or web address] 08/12/2018, 9:15 AM

## 2018-08-12 NOTE — Anesthesia Postprocedure Evaluation (Signed)
Anesthesia Post Note  Patient: Jonothan Heberle Ethridge  Procedure(s) Performed: ESOPHAGOGASTRODUODENOSCOPY (EGD) WITH PROPOFOL (N/A ) BIOPSY     Patient location during evaluation: PACU Anesthesia Type: MAC Level of consciousness: awake and alert Pain management: pain level controlled Vital Signs Assessment: post-procedure vital signs reviewed and stable Respiratory status: spontaneous breathing, nonlabored ventilation and respiratory function stable Cardiovascular status: stable and blood pressure returned to baseline Anesthetic complications: no    Last Vitals:  Vitals:   08/11/18 2030 08/12/18 0547  BP: (!) 150/62 (!) 115/50  Pulse: (!) 107 88  Resp: (!) 22 (!) 21  Temp: 37.1 C (!) 36.4 C  SpO2: 100% 95%    Last Pain:  Vitals:   08/12/18 0741  TempSrc:   PainSc: 0-No pain                 Audry Pili

## 2018-08-12 NOTE — Evaluation (Signed)
Physical Therapy Evaluation and Discharge Patient Details Name: Justin [name] MRN: [id number] DOB: 11-20-1955 Today's Date: 08/12/2018   History of Present Illness  Pt is a 63 y/o male admitted secondary to GI bleed. Pt is s/p EGD on 7/23 that revealed non bleeding ulcers. PMH includes HTN, a fib, DM, CAD s/p CABG.   Clinical Impression  Patient evaluated by Physical Therapy with no further acute PT needs identified. All education has been completed and the patient has no further questions. Pt with mild unsteadiness during dynamic gait tasks, however, with level walking, pt overall steady. Pt overall supervision for safety and reports he is close to baseline. Reports his wife will be able to assist as needed. See below for any follow-up Physical Therapy or equipment needs. PT is signing off. Thank you for this referral. If needs change, please re-consult.      Follow Up Recommendations No PT follow up    Equipment Recommendations  None recommended by PT    Recommendations for Other Services       Precautions / Restrictions Precautions Precautions: None Restrictions Weight Bearing Restrictions: No      Mobility  Bed Mobility               General bed mobility comments: Pt sitting EOB upon entry.   Transfers Overall transfer level: Needs assistance Equipment used: None Transfers: Sit to/from Stand Sit to Stand: Supervision         General transfer comment: Supervision for safety.   Ambulation/Gait Ambulation/Gait assistance: Supervision Gait Distance (Feet): 300 Feet Assistive device: None Gait Pattern/deviations: WFL(Within Functional Limits) Gait velocity: WFL    General Gait Details: Mild unsteadiness noted during dynamic gait tasks, however, no overt LOB noted. With level gait, pt with no LOB.   Stairs            Wheelchair Mobility    Modified Rankin (Stroke Patients Only)       Balance Overall balance assessment: Needs  assistance Sitting-balance support: No upper extremity supported;Feet supported Sitting balance-Leahy Scale: Good     Standing balance support: During functional activity;No upper extremity supported Standing balance-Leahy Scale: Good                   Standardized Balance Assessment Standardized Balance Assessment : Dynamic Gait Index   Dynamic Gait Index Level Surface: Normal Change in Gait Speed: Normal Gait with Horizontal Head Turns: Normal Gait with Vertical Head Turns: Normal Gait and Pivot Turn: Mild Impairment Step Over Obstacle: Mild Impairment Step Around Obstacles: Normal       Pertinent Vitals/Pain Pain Assessment: No/denies pain    Home Living Family/patient expects to be discharged to:: Private residence Living Arrangements: Spouse/significant other Available Help at Discharge: Family;Available 24 hours/day Type of Home: House Home Access: Stairs to enter   Technical brewer of Steps: 1(threshold) Home Layout: One level Home Equipment: None      Prior Function Level of Independence: Independent               Hand Dominance        Extremity/Trunk Assessment   Upper Extremity Assessment Upper Extremity Assessment: Overall WFL for tasks assessed    Lower Extremity Assessment Lower Extremity Assessment: Overall WFL for tasks assessed    Cervical / Trunk Assessment Cervical / Trunk Assessment: Normal  Communication   Communication: No difficulties  Cognition Arousal/Alertness: Awake/alert Behavior During Therapy: WFL for tasks assessed/performed Overall Cognitive Status: Within Functional Limits for tasks assessed  General Comments      Exercises     Assessment/Plan    PT Assessment Patent does not need any further PT services  PT Problem List         PT Treatment Interventions      PT Goals (Current goals can be found in the Care Plan section)  Acute  Rehab PT Goals Patient Stated Goal: to go home PT Goal Formulation: With patient Time For Goal Achievement: 08/12/18 Potential to Achieve Goals: Good    Frequency     Barriers to discharge        Co-evaluation               AM-PAC PT "6 Clicks" Mobility  Outcome Measure Help needed turning from your back to your side while in a flat bed without using bedrails?: None Help needed moving from lying on your back to sitting on the side of a flat bed without using bedrails?: None Help needed moving to and from a bed to a chair (including a wheelchair)?: None Help needed standing up from a chair using your arms (e.g., wheelchair or bedside chair)?: None Help needed to walk in hospital room?: None Help needed climbing 3-5 steps with a railing? : A Little 6 Click Score: 23    End of Session Equipment Utilized During Treatment: Gait belt Activity Tolerance: Patient tolerated treatment well Patient left: with call bell/phone within reach;in bed(sitting EOB ) Nurse Communication: Mobility status PT Visit Diagnosis: Other abnormalities of gait and mobility (R26.89)    Time: 0721-8288 PT Time Calculation (min) (ACUTE ONLY): 12 min   Charges:   PT Evaluation $PT Eval Low Complexity: [place], PT, DPT  Acute Rehabilitation Services  Pager: [phone] Office: [phone]   Rudean Hitt 08/12/2018, 1:10 PM

## 2018-08-13 LAB — RETICULOCYTES
Immature Retic Fract: 32.9 % — ABNORMAL HIGH (ref 2.3–15.9)
RBC.: 2.24 MIL/uL — ABNORMAL LOW (ref 4.22–5.81)
Retic Count, Absolute: 120.5 10*3/uL (ref 19.0–186.0)
Retic Ct Pct: 5.4 % — ABNORMAL HIGH (ref 0.4–3.1)

## 2018-08-13 LAB — GLUCOSE, CAPILLARY
Glucose-Capillary: 116 mg/dL — ABNORMAL HIGH (ref 70–99)
Glucose-Capillary: 110 mg/dL — ABNORMAL HIGH (ref 70–99)
Glucose-Capillary: 124 mg/dL — ABNORMAL HIGH (ref 70–99)

## 2018-08-13 LAB — IRON AND TIBC
Iron: 16 ug/dL — ABNORMAL LOW (ref 45–182)
Saturation Ratios: 6 % — ABNORMAL LOW (ref 17.9–39.5)
TIBC: 279 ug/dL (ref 250–450)
UIBC: 263 ug/dL

## 2018-08-13 LAB — FERRITIN: Ferritin: 61 ng/mL (ref 24–336)

## 2018-08-13 LAB — PREPARE RBC (CROSSMATCH)

## 2018-08-13 LAB — HEMOGLOBIN AND HEMATOCRIT, BLOOD
HCT: 23.3 % — ABNORMAL LOW (ref 39.0–52.0)
Hemoglobin: 7.8 g/dL — ABNORMAL LOW (ref 13.0–17.0)

## 2018-08-13 MED ORDER — METOPROLOL SUCCINATE ER 25 MG PO TB24: 12.5000 mg | ORAL_TABLET | Freq: Every day | ORAL | 0 refills | Status: DC

## 2018-08-13 MED ORDER — FUROSEMIDE 10 MG/ML IJ SOLN
20.0000 mg | Freq: Once | INTRAMUSCULAR | Status: AC
  Administered 2018-08-13: 20 mg via INTRAVENOUS
  Filled 2018-08-13: qty 2

## 2018-08-13 MED ORDER — FERROUS SULFATE 325 (65 FE) MG PO TABS: 325.0000 mg | ORAL_TABLET | Freq: Two times a day (BID) | ORAL | 0 refills | Status: DC

## 2018-08-13 MED ORDER — APIXABAN 5 MG PO TABS: 5.0000 mg | ORAL_TABLET | Freq: Two times a day (BID) | ORAL | Status: DC

## 2018-08-13 MED ORDER — PANTOPRAZOLE SODIUM 40 MG PO TBEC: 40.0000 mg | DELAYED_RELEASE_TABLET | Freq: Two times a day (BID) | ORAL | 1 refills | Status: DC

## 2018-08-13 MED ORDER — FERROUS SULFATE 325 (65 FE) MG PO TABS
325.0000 mg | ORAL_TABLET | Freq: Two times a day (BID) | ORAL | Status: DC
  Administered 2018-08-13 (×2): 325 mg via ORAL
  Filled 2018-08-13 (×2): qty 1

## 2018-08-13 MED ORDER — SODIUM CHLORIDE 0.9% IV SOLUTION: Freq: Once | INTRAVENOUS | Status: DC

## 2018-08-13 NOTE — Discharge Summary (Addendum)
Discharge Summary  Justin [name] ZDG:[id number] DOB: Jun 02, 1955  PCP: Justin Dials, MD  Admit date: 08/09/2018 Discharge date: 08/13/2018  Time spent: 35 minutes  Recommendations for Outpatient Follow-up:  1. Follow-up with GI within a week. 2. Follow-up with oncology 3. Follow-up with your cardiologist 4. Follow-up with your PCP 5. Take your medications as prescribed.  Discharge Diagnoses:  Active Hospital Problems   Diagnosis Date Noted  . GI bleed 08/10/2018  . Melena   . Acute gastric ulcer with hemorrhage   . Symptomatic anemia 08/10/2018  . Acute blood loss anemia 08/10/2018  . Fatty liver   . Polycythemia vera ([place]) 12/21/2017  . Atrial fibrillation ([place]) 11/12/2017  . Essential hypertension 10/19/2013    Resolved Hospital Problems  No resolved problems to display.    Discharge Condition: Stable  Diet recommendation: Resume previous diet.  Vitals:   08/13/18 1040 08/13/18 1350  BP: 137/79 (!) 145/68  Pulse: 96   Resp: 18 20  Temp: 98.3 F (36.8 C) 98.2 F (36.8 C)  SpO2: 100% 100%    History of present illness:  Justin [name] a 63 y.o.malewith medical history significant ofAS, chronic A.Fib on eliquis, DM2, HTN, CAD s/p CABG.  Patient presents to ED via EMS for near syncope. Has been intermittently lightheaded and dizzy for past few days, especially with position change and ambulation. This evening felt like he may pass out.  911 called. Per EMS patient 95J systolic, given 884 cc IVF en route with improvement.  No NSAID use. Did have coffee ground emesis last month but GI thought this was just reflux and his HGB seemed stable as of end of June (11.2). Also seemed to have been improving on PPI.  Has had intermittent dark tarry stool. No fevers, chills, abd pain.  ED Course:HGB 4.2 down from 11.2 at end of June. Baseline runs about 11. He is also on chronic hydroxyurea for treatment of polycytemia vera.  Hemoccult  positive.  Hemoglobin dropped to 3.4 on 08/10/2018.  Initially on Protonix drip then transitioned to oral protonix 40 mg twice daily.  EGD done on 08/11/2018 showed nonbleeding gastric ulcer with clean base. Continue Recommendations per GI. Transfused 4U PRBCs, Hg improved from 3.4 to 8.8 then gradually trended down. Hg on 08/13/18 was 7.8, will transfuse 1 unit PRBC to maintain hg >8.0.   08/13/18: Patient was seen and examined at his bedside this morning.  No acute events overnight.  He denies chest pain, palpitation or dyspnea.  Denies abdominal pain, nausea or diarrhea.  No bowel movements today or overnight.  Hemoglobin dropped to 7.8 this morning.  Will transfuse 1 unit PRBC to maintain Hemoglobin greater than 8.0 prior to discharge. Repeat H&H in house post transfusion and obtain CBC outpatient on Wednesday 08/17/18.  On the day of discharge, the patient was hemodynamically stable.  He will need to follow-up with GI, cardiology, oncology, and his primary care provider post hospitalization.  Patient understands and agrees to plan.    Hospital Course:  Principal Problem:   GI bleed Active Problems:   Essential hypertension   Atrial fibrillation ([facility])   Polycythemia vera ([facility])   Symptomatic anemia   Acute blood loss anemia   Fatty liver   Melena   Acute gastric ulcer with hemorrhage  Near-syncope likely secondary to profound anemia, multifactorial secondary to upper GI bleed from non bleeding gastric ulcers found on EGD done on 08/11/2018 versus myelosuppression on hydroxyurea EGD done on 08/10/2008 revealed nonbleeding gastric ulcer with  clean base. GI signed off 08/11/2018. Continue PPI Protonix p.o. 40 mg twice daily for 3 months followed by once daily as recommended by GI Resume Eliquis home 08/14/2018. No high-dose aspirin, ibuprofen, naproxen or other NSAIDs Awaiting results of the pathology Discontinue hydroxyurea due to bone marrow suppression contributing to anemia. Follow-up with  Dr. Maylon [name] hematologist outpatient Repeat CBC on Wednesday, 08/17/2018 Follow-up with PCP posthospitalization  Profound anemia likely multifactorial in the setting of upper GI bleed, hydroxyurea, and iron deficiency Presented with hemoglobin as low as 3.4 Baseline hemoglobin 12.4 from 02/17/2018. Transfuse 4 units PRBCs on 08/10/18 Continue to hold off hydroxyurea Gradual drop in hemoglobin from 8.8 posttransfusion to 8.0>>7.8 No stools reported today or overnight 1 unit PRBC ordered to be transfused on 08/13/2018 Iron studies done on 08/13/2018 revealed significant iron deficiency trans sat 6% Started on ferrous sulfate 325 mg twice daily Repeat H&H post blood transfusion Hg 9.9 on 08/13/18 post 1 unit PRBC. Repeat CBC on 08/17/2018 outpatient and follow-up with your primary care provider.  Nonbleeding gastric ulcers with profound anemia Presented with profound anemia with hemoglobin as low as 3.4 Post 4 unit PRBC transfusion on 08/10/2018 Hemoglobin 8.5 on 08/11/2018 from 8.8 on 08/10/2018. Management as stated above  Resolving mild pulmonary edema likely secondary to multiple blood transfusions Given IV Lasix 20 mg once O2 saturation 100% on room air on 08/13/18.  Elevated AST, ALT Alkaline phosphatase and total bilirubin normal Repeat CMP on Wednesday, 08/17/2018 Follow-up with GI outpatient within a week.  Physical debility No PT follow-up per PT assessment.  Hyperlipidemia Continue Crestor  History of type 2 diabetes Hemoglobin A1c 4.9 on 08/10/2018 Hold oral anti-glycemic Blood sugars within normal range.  Chronic A. fib Rate controlled On Eliquis for CVA prevention Resume Eliquis in 3 days (08/14/18) as recommended by GI due to upper GI bleed with nonbleeding gastric ulcers.  Hypertension/tachycardia Continue metoprolol XL reduced dose to 12.5 mg daily to avoid hypotension Continue to monitor vital signs  Polycythemia vera Continue to hold hydroxyurea due to bone  marrow suppression contributing to anemia   Code Status:Full  Consults called:GI.   Discharge Exam: BP (!) 145/68   Pulse 96   Temp 98.2 F (36.8 C) (Oral)   Resp 20   Ht 5\' 8"  (1.727 m)   Wt 90.8 kg   SpO2 100%   BMI 30.43 kg/m  . General: 63 y.o. year-old male well developed well nourished in no acute distress.  Alert and oriented x3. . Cardiovascular: Regular rate and rhythm with no rubs or gallops.  No thyromegaly or JVD noted.   Marland Kitchen Respiratory: Clear to auscultation with no wheezes or rales. Good inspiratory effort. . Abdomen: Soft nontender nondistended with normal bowel sounds x4 quadrants. . Musculoskeletal: No lower extremity edema. 2/4 pulses in all 4 extremities. Marland Kitchen Psychiatry: Mood is appropriate for condition and setting  Discharge Instructions You were cared for by a hospitalist during your hospital stay. If you have any questions about your discharge medications or the care you received while you were in the hospital after you are discharged, you can call the unit and asked to speak with the hospitalist on call if the hospitalist that took care of you is not available. Once you are discharged, your primary care physician will handle any further medical issues. Please note that NO REFILLS for any discharge medications will be authorized once you are discharged, as it is imperative that you return to your primary care physician (or establish a relationship  with a primary care physician if you do not have one) for your aftercare needs so that they can reassess your need for medications and monitor your lab values.   Allergies as of 08/13/2018      Reactions   Codeine Swelling   SWELLING REACTION UNSPECIFIED  Tolerates hydrocodone and oxycodone       Medication List    STOP taking these medications   hydroxyurea 500 MG capsule Commonly known as: HYDREA   omeprazole 40 MG capsule Commonly known as: PRILOSEC     TAKE these medications   amoxicillin 500 MG  capsule Commonly known as: AMOXIL Take 1 capsule (500 mg total) by mouth once as needed for up to 4 doses. Take four 500 mg capsules by mouth 1 hour before dental visits   apixaban 5 MG Tabs tablet Commonly known as: Eliquis Take 1 tablet (5 mg total) by mouth 2 (two) times daily. Start taking on: August 14, 2018   aspirin 81 MG tablet Take 81 mg by mouth daily.   Cholecalciferol 125 MCG (5000 UT) capsule Take 1 capsule by mouth daily.   cyclobenzaprine 10 MG tablet Commonly known as: FLEXERIL Take 10 mg by mouth 3 (three) times daily.   ferrous sulfate 325 (65 FE) MG tablet Take 1 tablet (325 mg total) by mouth 2 (two) times daily with a meal.   fluticasone 50 MCG/ACT nasal spray Commonly known as: FLONASE Place 2 sprays into both nostrils daily.   furosemide 20 MG tablet Commonly known as: LASIX Take 20 mg by mouth daily as needed for fluid.   Jardiance 10 MG Tabs tablet Generic drug: empagliflozin Take 10 mg by mouth daily.   loratadine 10 MG tablet Commonly known as: CLARITIN Take 10 mg by mouth daily.   metFORMIN 500 MG 24 hr tablet Commonly known as: GLUCOPHAGE-XR Take 500 mg by mouth daily before supper.   metoprolol succinate 25 MG 24 hr tablet Commonly known as: Toprol XL Take 0.5 tablets (12.5 mg total) by mouth daily. What changed: how much to take   nefazodone 100 MG tablet Commonly known as: SERZONE Take 100 mg by mouth 2 (two) times daily.   pantoprazole 40 MG tablet Commonly known as: PROTONIX Take 1 tablet (40 mg total) by mouth 2 (two) times daily before a meal. Take 1 tab 40 mg BID x 3 months then 1 tab daily.   POTASSIUM CHLORIDE PO Take 1 tablet by mouth daily as needed (when taking lasix).   pregabalin 50 MG capsule Commonly known as: LYRICA Take 50 mg by mouth 3 (three) times daily.   rosuvastatin 20 MG tablet Commonly known as: CRESTOR Take 1 tablet (20 mg total) by mouth daily.   zolpidem 10 MG tablet Commonly known as: AMBIEN  Take 10 mg by mouth at bedtime.      Allergies  Allergen Reactions  . Codeine Swelling    SWELLING REACTION UNSPECIFIED  Tolerates hydrocodone and oxycodone    Follow-up Information    Justin Dials, MD. Call in 1 day(s).   Specialty: Family Medicine Why: Please call for a post hospital follow-up appointment. Contact information: [address], [place] [address] [phone]        Buford Dresser, MD .   Specialty: Cardiology Contact information: [address] [place] [address] [phone]        Lavena Bullion, DO. Call in 1 day(s).   Specialty: Gastroenterology Why: Please call for a post hospital follow-up  appointment. Contact information: [place] [address] [phone]        Tish Men, MD. Call in 1 day(s).   Specialties: Hematology, Oncology Why: Please call for a post hospital follow-up appointment. Contact information: [place] [address] [phone]            The results of significant diagnostics from this hospitalization (including imaging, microbiology, ancillary and laboratory) are listed below for reference.    Significant Diagnostic Studies: US Abdomen Complete  Result Date: 08/10/2018 CLINICAL DATA:  63 year old male with fatty liver hypertension and diabetes. EXAM: ABDOMEN ULTRASOUND COMPLETE COMPARISON:  CT of the abdomen pelvis dated 06/20/2010 FINDINGS: Evaluation is limited due to body habitus. Gallbladder: There is sludge or small stones within the gallbladder. No gallbladder wall thickening or pericholecystic fluid. Negative sonographic Murphy's sign. Common bile duct: Diameter: 2 mm Liver: Mildly increased liver echogenicity most consistent with fatty infiltration. Superimposed inflammation or fibrosis is not excluded. There is apparent slight irregularity of the liver contour which may represent early changes of cirrhosis. Clinical  correlation is recommended. Portal vein is patent on color Doppler imaging with normal direction of blood flow towards the liver. IVC: No abnormality visualized. Pancreas: Visualized portion unremarkable. Spleen: Top normal measuring 13 cm in length. Right Kidney: Length: 11 cm. Normal echogenicity. No hydronephrosis or shadowing stone. Left Kidney: Length: 13 cm. Normal echogenicity. No hydronephrosis or shadowing stone. Abdominal aorta: No aneurysm visualized. Other findings: No ascites identified. IMPRESSION: 1. Gallbladder sludge versus tiny stones without sonographic findings of acute cholecystitis. 2. Probable mild fatty liver. 3. No ascites. Electronically Signed   By: Anner Crete M.D.   On: 08/10/2018 19:39   Dg Chest Port 1 View  Result Date: 08/10/2018 CLINICAL DATA:  Shortness of breath EXAM: PORTABLE CHEST 1 VIEW COMPARISON:  08/09/2018 FINDINGS: Cardiac shadow remains enlarged. Postsurgical changes are again seen. The lungs are well aerated bilaterally. Diffuse increased vascular congestion is noted when compare with the prior study. Some mild edema is noted on the right. No focal confluent infiltrate is seen. IMPRESSION: Increasing shortness of breath with mild parenchymal edema. Electronically Signed   By: Inez Catalina M.D.   On: 08/10/2018 23:01   Dg Chest Portable 1 View  Result Date: 08/09/2018 CLINICAL DATA:  Recent syncopal episode EXAM: PORTABLE CHEST 1 VIEW COMPARISON:  02/15/2018 FINDINGS: Cardiac shadow is enlarged. Postsurgical changes are seen. The lungs are well aerated bilaterally. No focal infiltrate or sizable effusion is noted. No bony abnormality is seen. IMPRESSION: No acute abnormality noted. Electronically Signed   By: Inez Catalina M.D.   On: 08/09/2018 23:31    Microbiology: Recent Results (from the past 240 hour(s))  SARS Coronavirus 2 (CEPHEID - Performed in [place] hospital lab), Hosp Order     Status: None   Collection Time: 08/10/18  1:02 AM    Specimen: Nasopharyngeal Swab  Result Value Ref Range Status   SARS Coronavirus 2 NEGATIVE NEGATIVE Final    Comment: (NOTE) If result is NEGATIVE SARS-CoV-2 target nucleic acids are NOT DETECTED. The SARS-CoV-2 RNA is generally detectable in upper and lower  respiratory specimens during the acute phase of infection. The lowest  concentration of SARS-CoV-2 viral copies this assay can detect is 250  copies / mL. A negative result does not preclude SARS-CoV-2 infection  and should not be used as the sole basis for treatment or other  patient management decisions.  A negative result may occur  with  improper specimen collection / handling, submission of specimen other  than nasopharyngeal swab, presence of viral mutation(s) within the  areas targeted by this assay, and inadequate number of viral copies  (<250 copies / mL). A negative result must be combined with clinical  observations, patient history, and epidemiological information. If result is POSITIVE SARS-CoV-2 target nucleic acids are DETECTED. The SARS-CoV-2 RNA is generally detectable in upper and lower  respiratory specimens dur ing the acute phase of infection.  Positive  results are indicative of active infection with SARS-CoV-2.  Clinical  correlation with patient history and other diagnostic information is  necessary to determine patient infection status.  Positive results do  not rule out bacterial infection or co-infection with other viruses. If result is PRESUMPTIVE POSTIVE SARS-CoV-2 nucleic acids MAY BE PRESENT.   A presumptive positive result was obtained on the submitted specimen  and confirmed on repeat testing.  While 2019 novel coronavirus  (SARS-CoV-2) nucleic acids may be present in the submitted sample  additional confirmatory testing may be necessary for epidemiological  and / or clinical management purposes  to differentiate between  SARS-CoV-2 and other Sarbecovirus currently known to infect humans.  If  clinically indicated additional testing with an alternate test  methodology [phone]) is advised. The SARS-CoV-2 RNA is generally  detectable in upper and lower respiratory sp ecimens during the acute  phase of infection. The expected result is Negative. Fact Sheet for Patients:  StrictlyIdeas.no Fact Sheet for Healthcare Providers: BankingDealers.co.za This test is not yet approved or cleared by the [place] FDA and has been authorized for detection and/or diagnosis of SARS-CoV-2 by FDA under an Emergency Use Authorization (EUA).  This EUA will remain in effect (meaning this test can be used) for the duration of the COVID-19 declaration under Section 564(b)(1) of the Act, 21 U.S.C. section 360bbb-3(b)(1), unless the authorization is terminated or revoked sooner. Performed at [facility], [place] [address] [place], [place] [address]      Labs: Basic Metabolic Panel: Recent Labs  Lab 08/09/18 2342 08/10/18 0140  NA 137 136  K 4.3 4.4  CL 108 107  CO2 18* 19*  GLUCOSE 129* 118*  BUN 24* 24*  CREATININE 0.99 0.99  CALCIUM 8.3* 8.3*   Liver Function Tests: Recent Labs  Lab 08/09/18 2342 08/10/18 0140  AST 84* 120*  ALT 55* 81*  ALKPHOS 57 64  BILITOT 0.8 0.9  PROT 5.4* 5.2*  ALBUMIN 2.8* 2.7*   No results for input(s): LIPASE, AMYLASE in the last 168 hours. No results for input(s): AMMONIA in the last 168 hours. CBC: Recent Labs  Lab 08/09/18 2342 08/10/18 0140 08/10/18 1635  08/11/18 1746 08/12/18 0559 08/12/18 1636 08/13/18 0433 08/13/18 1553  WBC 15.6* 17.2* 18.3*  --   --  12.5*  --   --   --   HGB 4.2* 3.4* 8.5*   < > 8.3* 8.0* 8.2* 7.8* 9.9*  HCT 13.6* 11.1* 24.5*   < > 24.4* 23.2* 24.5* 23.3* 29.2*  MCV 127.1* 126.1* 98.0  --   --  99.1  --   --   --   PLT 142* 154 122*  --   --  124*  --   --   --    < > = values in this interval not displayed.   Cardiac Enzymes: No results for input(s):  CKTOTAL, CKMB, CKMBINDEX, TROPONINI in the last 168 hours. BNP: BNP (last 3 results) No results for input(s): BNP  in the last 8760 hours.  ProBNP (last 3 results) No results for input(s): PROBNP in the last 8760 hours.  CBG: Recent Labs  Lab 08/12/18 1629 08/12/18 2151 08/13/18 0726 08/13/18 1102 08/13/18 1625  GLUCAP 134* 118* 116* 110* 124*       Signed:  Kayleen Memos, MD [facility] 08/13/2018, 5:44 PM

## 2018-08-13 NOTE — Discharge Instructions (Signed)
Gastrointestinal Bleeding Gastrointestinal (GI) bleeding is bleeding somewhere along the path that food travels through the body (digestive tract). This path is anywhere between the mouth and the opening of the butt (anus). You may have blood in your poop (stool) or have black poop. If you throw up (vomit), there may be blood in it. This condition can be mild, serious, or even life-threatening. If you have a lot of bleeding, you may need to stay in the hospital. What are the causes? This condition may be caused by:  Irritation and swelling of the esophagus (esophagitis). The esophagus is part of the body that moves food from your mouth to your stomach.  Swollen veins in the butt (hemorrhoids).  Areas of painful tearing in the opening of the butt (anal fissures). These are often caused by passing hard poop.  Pouches that form on the colon over time (diverticulosis).  Irritation and swelling (diverticulitis) in areas where pouches have formed on the colon.  Growths (polyps) or cancer. Colon cancer often starts out as growths that are not cancer.  Irritation of the stomach lining (gastritis).  Sores (ulcers) in the stomach. What increases the risk? You are more likely to develop this condition if you:  Have a certain type of infection in your stomach (Helicobacter pylori infection).  Take certain medicines.  Smoke.  Drink alcohol. What are the signs or symptoms? Common symptoms of this condition include:  Throwing up (vomiting) material that has bright red blood in it. It may look like coffee grounds.  Changes in your poop. The poop may: ? Have red blood in it. ? Be black, look like tar, and smell stronger than normal. ? Be red.  Pain or cramping in the belly (abdomen). How is this treated? Treatment for this condition depends on the cause of the bleeding. For example:  Sometimes, the bleeding can be stopped during a procedure that is done to find the problem (endoscopy or  colonoscopy).  Medicines can be used to: ? Help control irritation, swelling, or infection. ? Reduce acid in your stomach.  Certain problems can be treated with: ? Creams. ? Medicines that are put in the butt (suppositories). ? Warm baths.  Surgery is sometimes needed.  If you lose a lot of blood, you may need a blood transfusion. If bleeding is mild, you may be allowed to go home. If there is a lot of bleeding, you will need to stay in the hospital. Follow these instructions at home:   Take over-the-counter and prescription medicines only as told by your doctor.  Eat foods that have a lot of fiber in them. These foods include beans, whole grains, and fresh fruits and vegetables. You can also try eating 1-3 prunes each day.  Drink enough fluid to keep your pee (urine) pale yellow.  Keep all follow-up visits as told by your doctor. This is important. Contact a doctor if:  Your symptoms do not get better. Get help right away if:  Your bleeding does not stop.  You feel dizzy or you pass out (faint).  You feel weak.  You have very bad cramps in your back or belly.  You pass large clumps of blood (clots) in your poop.  Your symptoms are getting worse.  You have chest pain or fast heartbeats. Summary  GI bleeding is bleeding somewhere along the path that food travels through the body (digestive tract).  This bleeding can be caused by many things. Treatment depends on the cause of the bleeding.  Take medicines only as told by your doctor.  Keep all follow-up visits as told by your doctor. This is important. This information is not intended to replace advice given to you by your health care provider. Make sure you discuss any questions you have with your health care provider. Document Released: 10/15/2007 Document Revised: 08/18/2017 Document Reviewed: 08/18/2017 Elsevier Patient Education  [employer].  Anemia  Anemia is a condition in which you do not have  enough red blood cells or hemoglobin. Hemoglobin is a substance in red blood cells that carries oxygen. When you do not have enough red blood cells or hemoglobin (are anemic), your body cannot get enough oxygen and your organs may not work properly. As a result, you may feel very tired or have other problems. What are the causes? Common causes of anemia include:  Excessive bleeding. Anemia can be caused by excessive bleeding inside or outside the body, including bleeding from the intestine or from periods in women.  Poor nutrition.  Long-lasting (chronic) kidney, thyroid, and liver disease.  Bone marrow disorders.  Cancer and treatments for cancer.  HIV (human immunodeficiency virus) and AIDS (acquired immunodeficiency syndrome).  Treatments for HIV and AIDS.  Spleen problems.  Blood disorders.  Infections, medicines, and autoimmune disorders that destroy red blood cells. What are the signs or symptoms? Symptoms of this condition include:  Minor weakness.  Dizziness.  Headache.  Feeling heartbeats that are irregular or faster than normal (palpitations).  Shortness of breath, especially with exercise.  Paleness.  Cold sensitivity.  Indigestion.  Nausea.  Difficulty sleeping.  Difficulty concentrating. Symptoms may occur suddenly or develop slowly. If your anemia is mild, you may not have symptoms. How is this diagnosed? This condition is diagnosed based on:  Blood tests.  Your medical history.  A physical exam.  Bone marrow biopsy. Your health care provider may also check your stool (feces) for blood and may do additional testing to look for the cause of your bleeding. You may also have other tests, including:  Imaging tests, such as a CT scan or MRI.  Endoscopy.  Colonoscopy. How is this treated? Treatment for this condition depends on the cause. If you continue to lose a lot of blood, you may need to be treated at a hospital. Treatment may  include:  Taking supplements of iron, vitamin O27, or folic acid.  Taking a hormone medicine (erythropoietin) that can help to stimulate red blood cell growth.  Having a blood transfusion. This may be needed if you lose a lot of blood.  Making changes to your diet.  Having surgery to remove your spleen. Follow these instructions at home:  Take over-the-counter and prescription medicines only as told by your health care provider.  Take supplements only as told by your health care provider.  Follow any diet instructions that you were given.  Keep all follow-up visits as told by your health care provider. This is important. Contact a health care provider if:  You develop new bleeding anywhere in the body. Get help right away if:  You are very weak.  You are short of breath.  You have pain in your abdomen or chest.  You are dizzy or feel faint.  You have trouble concentrating.  You have bloody or black, tarry stools.  You vomit repeatedly or you vomit up blood. Summary  Anemia is a condition in which you do not have enough red blood cells or enough of a substance in your red blood cells that  carries oxygen (hemoglobin).  Symptoms may occur suddenly or develop slowly.  If your anemia is mild, you may not have symptoms.  This condition is diagnosed with blood tests as well as a medical history and physical exam. Other tests may be needed.  Treatment for this condition depends on the cause of the anemia. This information is not intended to replace advice given to you by your health care provider. Make sure you discuss any questions you have with your health care provider. Document Released: 02/13/2004 Document Revised: 12/18/2016 Document Reviewed: 02/07/2016 Elsevier Patient Education  2020 [employer].

## 2018-08-14 LAB — HEMOGLOBIN AND HEMATOCRIT, BLOOD
HCT: 29.2 % — ABNORMAL LOW (ref 39.0–52.0)
Hemoglobin: 9.9 g/dL — ABNORMAL LOW (ref 13.0–17.0)

## 2018-08-14 LAB — BPAM RBC
Blood Product Expiration Date: 202008152359
ISSUE DATE / TIME: 202007251015
Unit Type and Rh: 6200

## 2018-08-14 LAB — TYPE AND SCREEN
ABO/RH(D): A POS
Antibody Screen: NEGATIVE
Unit division: 0

## 2018-08-15 ENCOUNTER — Telehealth: Payer: Self-pay | Admitting: *Deleted

## 2018-08-15 ENCOUNTER — Encounter (HOSPITAL_COMMUNITY): Payer: BC Managed Care – PPO

## 2018-08-15 ENCOUNTER — Telehealth: Payer: Self-pay | Admitting: Hematology

## 2018-08-15 LAB — FOLATE RBC
Folate, Hemolysate: 309 ng/mL
Folate, RBC: 1458 ng/mL (ref >498)
Hematocrit: 21.2 % — ABNORMAL LOW (ref 37.5–51.0)

## 2018-08-15 NOTE — Telephone Encounter (Signed)
Called and spoke with patient's wife regarding appointment added per 7/27 patient message

## 2018-08-15 NOTE — Telephone Encounter (Signed)
Justin [name] states he was discharged from [facility] on Saturday and needs to schedule a follow up appt with Dr Maylon Peppers this week.

## 2018-08-15 NOTE — Telephone Encounter (Signed)
Hi Tammy,  Thanks for letting me know.   Kenney Houseman, can we schedule Mr. Quashie for labs and clinic follow-up tomorrow (ie 08/16/2018)?  Thanks.   Dr. Maylon Peppers

## 2018-08-16 ENCOUNTER — Encounter: Payer: Self-pay | Admitting: Gastroenterology

## 2018-08-16 ENCOUNTER — Inpatient Hospital Stay (HOSPITAL_BASED_OUTPATIENT_CLINIC_OR_DEPARTMENT_OTHER): Payer: BC Managed Care – PPO | Admitting: Hematology

## 2018-08-16 ENCOUNTER — Telehealth: Payer: Self-pay | Admitting: Hematology

## 2018-08-16 ENCOUNTER — Encounter: Payer: Self-pay | Admitting: Hematology

## 2018-08-16 ENCOUNTER — Other Ambulatory Visit: Payer: Self-pay

## 2018-08-16 ENCOUNTER — Inpatient Hospital Stay: Payer: BC Managed Care – PPO | Attending: Hematology and Oncology

## 2018-08-16 VITALS — BP 95/41 | HR 75 | Temp 97.5°F | Resp 18 | Ht 68.0 in | Wt 202.0 lb

## 2018-08-16 DIAGNOSIS — Z7982 Long term (current) use of aspirin: Secondary | ICD-10-CM | POA: Insufficient documentation

## 2018-08-16 DIAGNOSIS — K760 Fatty (change of) liver, not elsewhere classified: Secondary | ICD-10-CM | POA: Insufficient documentation

## 2018-08-16 DIAGNOSIS — I2510 Atherosclerotic heart disease of native coronary artery without angina pectoris: Secondary | ICD-10-CM

## 2018-08-16 DIAGNOSIS — Z7984 Long term (current) use of oral hypoglycemic drugs: Secondary | ICD-10-CM | POA: Insufficient documentation

## 2018-08-16 DIAGNOSIS — D45 Polycythemia vera: Secondary | ICD-10-CM | POA: Diagnosis not present

## 2018-08-16 DIAGNOSIS — Z9181 History of falling: Secondary | ICD-10-CM | POA: Diagnosis not present

## 2018-08-16 DIAGNOSIS — K259 Gastric ulcer, unspecified as acute or chronic, without hemorrhage or perforation: Secondary | ICD-10-CM | POA: Diagnosis not present

## 2018-08-16 DIAGNOSIS — D649 Anemia, unspecified: Secondary | ICD-10-CM | POA: Insufficient documentation

## 2018-08-16 DIAGNOSIS — D72829 Elevated white blood cell count, unspecified: Secondary | ICD-10-CM | POA: Insufficient documentation

## 2018-08-16 DIAGNOSIS — I10 Essential (primary) hypertension: Secondary | ICD-10-CM | POA: Insufficient documentation

## 2018-08-16 DIAGNOSIS — Z7901 Long term (current) use of anticoagulants: Secondary | ICD-10-CM

## 2018-08-16 DIAGNOSIS — D75839 Thrombocytosis, unspecified: Secondary | ICD-10-CM

## 2018-08-16 DIAGNOSIS — Z79899 Other long term (current) drug therapy: Secondary | ICD-10-CM | POA: Insufficient documentation

## 2018-08-16 DIAGNOSIS — R5383 Other fatigue: Secondary | ICD-10-CM | POA: Insufficient documentation

## 2018-08-16 DIAGNOSIS — I959 Hypotension, unspecified: Secondary | ICD-10-CM

## 2018-08-16 DIAGNOSIS — D473 Essential (hemorrhagic) thrombocythemia: Secondary | ICD-10-CM

## 2018-08-16 LAB — CMP (CANCER CENTER ONLY)
ALT: 43 U/L (ref 0–44)
AST: 13 U/L — ABNORMAL LOW (ref 15–41)
Albumin: 3.2 g/dL — ABNORMAL LOW (ref 3.5–5.0)
Alkaline Phosphatase: 65 U/L (ref 38–126)
Anion gap: 9 (ref 5–15)
BUN: 7 mg/dL — ABNORMAL LOW (ref 8–23)
CO2: 26 mmol/L (ref 22–32)
Calcium: 7.8 mg/dL — ABNORMAL LOW (ref 8.9–10.3)
Chloride: 103 mmol/L (ref 98–111)
Creatinine: 0.7 mg/dL (ref 0.61–1.24)
GFR, Est AFR Am: >60 mL/min (ref >60)
GFR, Estimated: >60 mL/min (ref >60)
Glucose, Bld: 94 mg/dL (ref 70–99)
Potassium: 3.4 mmol/L — ABNORMAL LOW (ref 3.5–5.1)
Sodium: 138 mmol/L (ref 135–145)
Total Bilirubin: 0.9 mg/dL (ref 0.3–1.2)
Total Protein: 6.3 g/dL — ABNORMAL LOW (ref 6.5–8.1)

## 2018-08-16 LAB — CBC WITH DIFFERENTIAL (CANCER CENTER ONLY)
Abs Immature Granulocytes: 1.03 10*3/uL — ABNORMAL HIGH (ref 0.00–0.07)
Basophils Absolute: 0.1 10*3/uL (ref 0.0–0.1)
Basophils Relative: 1 %
Eosinophils Absolute: 0.1 10*3/uL (ref 0.0–0.5)
Eosinophils Relative: 0 %
HCT: 29.7 % — ABNORMAL LOW (ref 39.0–52.0)
Hemoglobin: 9.5 g/dL — ABNORMAL LOW (ref 13.0–17.0)
Immature Granulocytes: 6 %
Lymphocytes Relative: 10 %
Lymphs Abs: 1.6 10*3/uL (ref 0.7–4.0)
MCH: 33.3 pg (ref 26.0–34.0)
MCHC: 32 g/dL (ref 30.0–36.0)
MCV: 104.2 fL — ABNORMAL HIGH (ref 80.0–100.0)
Monocytes Absolute: 2.9 10*3/uL — ABNORMAL HIGH (ref 0.1–1.0)
Monocytes Relative: 18 %
Neutro Abs: 10.5 10*3/uL — ABNORMAL HIGH (ref 1.7–7.7)
Neutrophils Relative %: 65 %
Platelet Count: 182 10*3/uL (ref 150–400)
RBC: 2.85 MIL/uL — ABNORMAL LOW (ref 4.22–5.81)
RDW: 22.1 % — ABNORMAL HIGH (ref 11.5–15.5)
WBC Count: 16.3 10*3/uL — ABNORMAL HIGH (ref 4.0–10.5)
nRBC: 0.2 % (ref 0.0–0.2)

## 2018-08-16 NOTE — Progress Notes (Signed)
[name] OFFICE PROGRESS NOTE  Patient Care Team: [name], MD as PCP - General (Family Medicine) Buford Dresser, MD as PCP - Cardiology (Cardiology)  HEME/ONC OVERVIEW: 1. Polycythemia vera, JAK2 V617 mutation+; high risk (age >52) -Previous patient of Dr. Audelia Hives  -11/2017: Hgb ~17; WBC ~30k w/ nl diff, plts ~500k; NGS showed JAK2 V617F mutation (83% allele frequency), CAL-R mutation (4% allele frequency); BCR/ALB negative; no bone marrow biopsy done at diagnosis  -11/2017 - present: Hydrea, see dosing below; phlebotomy x 1 in 12/2017  TREATMENT REGIMEN:  12/03/2017 - present: Hydrea, currently 576m TID  PRN phlebotomy, most recent on 12/23/2017  PERTINENT NON-HEM/ONC PROBLEMS: 1. CAD s/p CABG x 4 in 12/2017 2. Moderate to severe aortic stenosis s/p aortic valve replacement and left atrial clip in 12/2017   ASSESSMENT & PLAN:   Acute on chronic anemia due to blood loss -Hgb nadired at 3.4 in mid-07/2018, requiring multiple units of RBC -EGD showed multiple non-bleeding gastric ulcers, the largest measuring 1.0cm; bx showed chronic gastritis w/o H. pylori  -While Hydrea can certainly contribute to anemia, his Hgb has been stable between 10 and 11 since the beginning of 2020, so it is less likely the cause of acute decline in Hgb -Clinically, patient has had 3 weeks of melena, concerning for ongoing blood loss -I have reached out to Dr. CVivia Ewingoffice for further work-up to assess for GI blood loss, including colonoscopy and small bowel evaluation as indicated  -Continue daily iron supplement   Polycythemia vera, JAK2+; high risk  -Previous patient of Dr. RAudelia Hives-Hct 30 today  -Given the recent suspected acute GI bleeding requiring 9 units of RBC, I will hold off resuming Hydrea for now and repeat labs in 1 month  -Goal Hct < 45% -On ASA 845mdaily   Leukocytosis -Most likely secondary to PV -WBC 16.3k today, higher than the last  visit -Given the recent severe anemia, Hydrea is on hold as outlined above -We will monitor it for now   Borderline hypotension -BP 95/41 today, patient is asymptomatic other than fatigue -Currently on metoprolol alone -I recommended the patient to contact his cardiologist for further management of anti-hypertensives   Orders Placed This Encounter  Procedures  . CBC with Differential (Cancer Center Only)    Standing Status:   Future    Standing Expiration Date:   09/20/2019  . CMP ([facility])    Standing Status:   Future    Standing Expiration Date:   09/20/2019  . Save Smear ([facility])    Standing Status:   Future    Standing Expiration Date:   08/16/2019  . Ferritin    Standing Status:   Future    Standing Expiration Date:   09/20/2019  . Iron and TIBC    Standing Status:   Future    Standing Expiration Date:   09/20/2019  . Soluble transferrin receptor    Standing Status:   Future    Standing Expiration Date:   08/16/2019    All questions were answered. The patient knows to call the clinic with any problems, questions or concerns. No barriers to learning was detected.  A total of more than 25 minutes were spent face-to-face with the patient during this encounter and over half of that time was spent on counseling and coordination of care as outlined above.   Return in 1 month for labs and clinic follow-up.   YaTish MenMD 08/16/2018 3:46 PM  CHIEF COMPLAINT: "My  blood counts were really low"  INTERVAL HISTORY: Justin [name] returns to clinic for follow-up of PV.  Patient reports that for the past 3 weeks, he has been having daily black stool.  The stool is formed instead of tarry, and as a result he did not feel need to contact his PCP or gastroenterologist.  He tripped over a coffee table on the way to the bathroom last Tuesday and fell, prompting his family to take him to the ER for further evaluation.  In the ER, his hemoglobin was noted to be in the low 4's, and he was  admitted to the hospital for further management.  EGD showed multiple shallow gastric ulcers.  He received a total of 5 units of RBCs.  Since discharge, he is still having daily black stool, but the quantity of stool is less.  He denies any abdominal pain, nausea, vomiting, hematemesis, hemoptysis, or hematochezia.  He is taking oral iron supplement daily.  He still feels very fatigued.  He denies any other complaint today.  SUMMARY OF ONCOLOGIC HISTORY: Oncology History   No history exists.    REVIEW OF SYSTEMS:   Constitutional: ( - ) fevers, ( - )  chills , ( - ) night sweats Eyes: ( - ) blurriness of vision, ( - ) double vision, ( - ) watery eyes Ears, nose, mouth, throat, and face: ( - ) mucositis, ( - ) sore throat Respiratory: ( - ) cough, ( - ) dyspnea, ( - ) wheezes Cardiovascular: ( - ) palpitation, ( - ) chest discomfort, ( - ) lower extremity swelling Gastrointestinal:  ( - ) nausea, ( - ) heartburn, ( - ) change in bowel habits Skin: ( - ) abnormal skin rashes Lymphatics: ( - ) new lymphadenopathy, ( - ) easy bruising Neurological: ( - ) numbness, ( - ) tingling, ( - ) new weaknesses Behavioral/Psych: ( - ) mood change, ( - ) new changes  All other systems were reviewed with the patient and are negative.  I have reviewed the past medical history, past surgical history, social history and family history with the patient and they are unchanged from previous note.  ALLERGIES:  is allergic to codeine.  MEDICATIONS:  Current Outpatient Medications  Medication Sig Dispense Refill  . amoxicillin (AMOXIL) 500 MG capsule Take 1 capsule (500 mg total) by mouth once as needed for up to 4 doses. Take four 500 mg capsules by mouth 1 hour before dental visits 4 capsule 12  . apixaban (ELIQUIS) 5 MG TABS tablet Take 1 tablet (5 mg total) by mouth 2 (two) times daily. 60 tablet   . aspirin 81 MG tablet Take 81 mg by mouth daily.     . Cholecalciferol 125 MCG (5000 UT) capsule Take 1  capsule by mouth daily.     . cyclobenzaprine (FLEXERIL) 10 MG tablet Take 10 mg by mouth 3 (three) times daily.     . empagliflozin (JARDIANCE) 10 MG TABS tablet Take 10 mg by mouth daily. 30 tablet 11  . ferrous sulfate 325 (65 FE) MG tablet Take 1 tablet (325 mg total) by mouth 2 (two) times daily with a meal. 60 tablet 0  . fluticasone (FLONASE) 50 MCG/ACT nasal spray Place 2 sprays into both nostrils daily.    . furosemide (LASIX) 20 MG tablet Take 20 mg by mouth daily as needed for fluid.     Marland Kitchen loratadine (CLARITIN) 10 MG tablet Take 10 mg by mouth daily.    Marland Kitchen  metFORMIN (GLUCOPHAGE-XR) 500 MG 24 hr tablet Take 500 mg by mouth daily before supper.    . metoprolol succinate (TOPROL XL) 25 MG 24 hr tablet Take 0.5 tablets (12.5 mg total) by mouth daily. 30 tablet 0  . nefazodone (SERZONE) 100 MG tablet Take 100 mg by mouth 2 (two) times daily.    . pantoprazole (PROTONIX) 40 MG tablet Take 1 tablet (40 mg total) by mouth 2 (two) times daily before a meal. Take 1 tab 40 mg BID x 3 months then 1 tab daily. 120 tablet 1  . POTASSIUM CHLORIDE PO Take 1 tablet by mouth daily as needed (when taking lasix).     . pregabalin (LYRICA) 50 MG capsule Take 50 mg by mouth 3 (three) times daily.    . rosuvastatin (CRESTOR) 20 MG tablet Take 1 tablet (20 mg total) by mouth daily. 90 tablet 3  . zolpidem (AMBIEN) 10 MG tablet Take 10 mg by mouth at bedtime.      No current facility-administered medications for this visit.     PHYSICAL EXAMINATION: ECOG PERFORMANCE STATUS: 1 - Symptomatic but completely ambulatory  Today's Vitals   08/16/18 1523  BP: (!) 95/41  Pulse: 75  Resp: 18  Temp: (!) 97.5 F (36.4 C)  TempSrc: Temporal  SpO2: 90%  Weight: 202 lb (91.6 kg)  Height: 5' 8"  (1.727 m)  PainSc: 0-No pain   Body mass index is 30.71 kg/m.  Filed Weights   08/16/18 1523  Weight: 202 lb (91.6 kg)    GENERAL: alert, no distress and comfortable, slightly pale  SKIN: chronic skin damage due  to sun exposure  EYES: conjunctiva are pink and non-injected, sclera clear OROPHARYNX: no exudate, no erythema; lips, buccal mucosa, and tongue normal  NECK: supple, non-tender LYMPH:  no palpable lymphadenopathy in the cervical LUNGS: clear to auscultation with normal breathing effort HEART: regular rate & rhythm and no murmurs and no lower extremity edema ABDOMEN: soft, non-tender, non-distended, normal bowel sounds Musculoskeletal: no cyanosis of digits and no clubbing  PSYCH: alert & oriented x 3, fluent speech NEURO: no focal motor/sensory deficits  LABORATORY DATA:  I have reviewed the data as listed    Component Value Date/Time   NA 136 08/10/2018 0140   K 4.4 08/10/2018 0140   CL 107 08/10/2018 0140   CO2 19 (L) 08/10/2018 0140   GLUCOSE 118 (H) 08/10/2018 0140   BUN 24 (H) 08/10/2018 0140   CREATININE 0.99 08/10/2018 0140   CREATININE 0.80 07/19/2018 1044   CALCIUM 8.3 (L) 08/10/2018 0140   PROT 5.2 (L) 08/10/2018 0140   ALBUMIN 2.7 (L) 08/10/2018 0140   AST 120 (H) 08/10/2018 0140   AST 14 (L) 07/19/2018 1044   ALT 81 (H) 08/10/2018 0140   ALT 7 07/19/2018 1044   ALKPHOS 64 08/10/2018 0140   BILITOT 0.9 08/10/2018 0140   BILITOT 0.5 07/19/2018 1044   GFRNONAA >60 08/10/2018 0140   GFRNONAA >60 07/19/2018 1044   GFRAA >60 08/10/2018 0140   GFRAA >60 07/19/2018 1044    No results found for: SPEP, UPEP  Lab Results  Component Value Date   WBC 16.3 (H) 08/16/2018   NEUTROABS 10.5 (H) 08/16/2018   HGB 9.5 (L) 08/16/2018   HCT 29.7 (L) 08/16/2018   MCV 104.2 (H) 08/16/2018   PLT 182 08/16/2018      Chemistry      Component Value Date/Time   NA 136 08/10/2018 0140   K 4.4 08/10/2018 0140  CL 107 08/10/2018 0140   CO2 19 (L) 08/10/2018 0140   BUN 24 (H) 08/10/2018 0140   CREATININE 0.99 08/10/2018 0140   CREATININE 0.80 07/19/2018 1044      Component Value Date/Time   CALCIUM 8.3 (L) 08/10/2018 0140   ALKPHOS 64 08/10/2018 0140   AST 120 (H)  08/10/2018 0140   AST 14 (L) 07/19/2018 1044   ALT 81 (H) 08/10/2018 0140   ALT 7 07/19/2018 1044   BILITOT 0.9 08/10/2018 0140   BILITOT 0.5 07/19/2018 1044       RADIOGRAPHIC STUDIES: I have personally reviewed the radiological images as listed below and agreed with the findings in the report. US Abdomen Complete  Result Date: 08/10/2018 CLINICAL DATA:  63 year old male with fatty liver hypertension and diabetes. EXAM: ABDOMEN ULTRASOUND COMPLETE COMPARISON:  CT of the abdomen pelvis dated 06/20/2010 FINDINGS: Evaluation is limited due to body habitus. Gallbladder: There is sludge or small stones within the gallbladder. No gallbladder wall thickening or pericholecystic fluid. Negative sonographic Murphy's sign. Common bile duct: Diameter: 2 mm Liver: Mildly increased liver echogenicity most consistent with fatty infiltration. Superimposed inflammation or fibrosis is not excluded. There is apparent slight irregularity of the liver contour which may represent early changes of cirrhosis. Clinical correlation is recommended. Portal vein is patent on color Doppler imaging with normal direction of blood flow towards the liver. IVC: No abnormality visualized. Pancreas: Visualized portion unremarkable. Spleen: Top normal measuring 13 cm in length. Right Kidney: Length: 11 cm. Normal echogenicity. No hydronephrosis or shadowing stone. Left Kidney: Length: 13 cm. Normal echogenicity. No hydronephrosis or shadowing stone. Abdominal aorta: No aneurysm visualized. Other findings: No ascites identified. IMPRESSION: 1. Gallbladder sludge versus tiny stones without sonographic findings of acute cholecystitis. 2. Probable mild fatty liver. 3. No ascites. Electronically Signed   By: Anner Crete M.D.   On: 08/10/2018 19:39   Dg Chest Port 1 View  Result Date: 08/10/2018 CLINICAL DATA:  Shortness of breath EXAM: PORTABLE CHEST 1 VIEW COMPARISON:  08/09/2018 FINDINGS: Cardiac shadow remains enlarged.  Postsurgical changes are again seen. The lungs are well aerated bilaterally. Diffuse increased vascular congestion is noted when compare with the prior study. Some mild edema is noted on the right. No focal confluent infiltrate is seen. IMPRESSION: Increasing shortness of breath with mild parenchymal edema. Electronically Signed   By: Inez Catalina M.D.   On: 08/10/2018 23:01   Dg Chest Portable 1 View  Result Date: 08/09/2018 CLINICAL DATA:  Recent syncopal episode EXAM: PORTABLE CHEST 1 VIEW COMPARISON:  02/15/2018 FINDINGS: Cardiac shadow is enlarged. Postsurgical changes are seen. The lungs are well aerated bilaterally. No focal infiltrate or sizable effusion is noted. No bony abnormality is seen. IMPRESSION: No acute abnormality noted. Electronically Signed   By: Inez Catalina M.D.   On: 08/09/2018 23:31

## 2018-08-16 NOTE — Telephone Encounter (Signed)
Appointments scheduled LMVM for patient per 7/28 los

## 2018-08-17 ENCOUNTER — Encounter (HOSPITAL_COMMUNITY): Payer: BC Managed Care – PPO

## 2018-08-17 ENCOUNTER — Telehealth (HOSPITAL_COMMUNITY): Payer: Self-pay | Admitting: *Deleted

## 2018-08-17 LAB — LACTATE DEHYDROGENASE: LDH: 313 U/L — ABNORMAL HIGH (ref 98–192)

## 2018-08-17 NOTE — Telephone Encounter (Signed)
PC to patient to f/u about MD visit on 08/16/2018. Per OV note, further GI workup is pending. Next GI appointment is scheduled for 09/09/2018.  Pt's graduation date from [employer] is 08/26/2018 but pt will not be able to return to [facility] before his graduation date d/t further workup.  Pt verbalized understanding.

## 2018-08-19 ENCOUNTER — Encounter (HOSPITAL_COMMUNITY): Payer: BC Managed Care – PPO

## 2018-08-19 DIAGNOSIS — D500 Iron deficiency anemia secondary to blood loss (chronic): Secondary | ICD-10-CM | POA: Diagnosis not present

## 2018-08-19 DIAGNOSIS — M545 Low back pain: Secondary | ICD-10-CM | POA: Diagnosis not present

## 2018-08-19 NOTE — Progress Notes (Signed)
Discharge Progress Report  Patient Details  Name: Justin [name] MRN: [id number] Date of Birth: 05-18-1955 Referring Provider:     [place] from 03/03/2018 in [place]  Referring Provider  Dr. Harrell Gave       Number of Visits: 13  Reason for Discharge:  Early Exit:  Other medical issues that would prohibit return to cardiac rehab.  Smoking History:  Social History   Tobacco Use  Smoking Status Former Smoker  . Packs/day: 0.25  . Years: 23.00  . Pack years: 5.75  . Types: Cigars  . Quit date: 01/02/2018  . Years since quitting: 0.6  Smokeless Tobacco Never Used  Tobacco Comment   quit cigs in 2011 and smokes cigars daily- from 3-10 cigars-09/27/13    Diagnosis:  S/P AVR (aortic valve replacement)  S/P CABG x 4  ADL [facility]:   Initial Exercise Prescription: Initial Exercise Prescription - 03/03/18 1100      Date of Initial Exercise RX and Referring Provider   Date  03/03/18    Referring Provider  Dr. Harrell Gave    Expected Discharge Date  06/10/18      Bike   Level  0.5    Minutes  10    METs  2.11      NuStep   Level  2    SPM  75    Minutes  10    METs  3      Track   Laps  9    Minutes  10    METs  2.53      Prescription Details   Frequency (times per week)  3    Duration  Progress to 30 minutes of continuous aerobic without signs/symptoms of physical distress      Intensity   THRR 40-80% of Max Heartrate  62-125    Ratings of Perceived Exertion  11-13      Progression   Progression  Continue to progress workloads to maintain intensity without signs/symptoms of physical distress.      Resistance Training   Training Prescription  Yes    Weight  3 lbs.     Reps  10-15       Discharge Exercise Prescription (Final Exercise Prescription Changes): Exercise Prescription Changes - 08/03/18 1055      Response to Exercise   Blood Pressure (Admit)  104/72    Blood Pressure  (Exercise)  118/62    Blood Pressure (Exit)  100/60    Heart Rate (Admit)  86 bpm    Heart Rate (Exercise)  111 bpm    Heart Rate (Exit)  86 bpm    Rating of Perceived Exertion (Exercise)  12    Perceived Dyspnea (Exercise)  0    Symptoms  None    Duration  Progress to 30 minutes of  aerobic without signs/symptoms of physical distress    Intensity  THRR unchanged      Progression   Progression  Continue to progress workloads to maintain intensity without signs/symptoms of physical distress.    Average METs  2.3      Resistance Training   Training Prescription  No   Relaxation day, no weights.     Interval Training   Interval Training  No      NuStep   Level  3    SPM  85    Minutes  15    METs  2.3      Arm Ergometer  Level  2.1    Minutes  15    METs  2.3      Home Exercise Plan   Plans to continue exercise at  Home (comment)   Walking   Frequency  Add 2 additional days to program exercise sessions.    Initial Home Exercises Provided  03/18/18       Functional Capacity: 6 Minute Walk    Row Name 03/03/18 1055         6 Minute Walk   Phase  Initial     Distance  1325 feet     Walk Time  6 minutes     # of Rest Breaks  1     MPH  2.5     METS  3.2     RPE  13     VO2 Peak  11.2     Symptoms  Yes (comment)     Comments  Claudication 7/10 left leg     Resting HR  77 bpm     Resting BP  108/72     Resting Oxygen Saturation   96 %     Exercise Oxygen Saturation  during 6 min walk  96 %     Max Ex. HR  108 bpm     Max Ex. BP  130/82     2 Minute Post BP  112/64        Psychological, QOL, Others - Outcomes: PHQ 2/9: Depression screen [facility] 2/9 07/29/2018 03/11/2018 03/20/2014  Decreased Interest 0 0 0  Down, Depressed, Hopeless 0 0 0  PHQ - 2 Score 0 0 0    Quality of Life: Quality of Life - 03/03/18 1058      Quality of Life   Select  Quality of Life      Quality of Life Scores   Health/Function Pre  25.43 %    Socioeconomic Pre  26.71 %     Psych/Spiritual Pre  27.21 %    Family Pre  24.75 %    GLOBAL Pre  25.09 %       Personal Goals: Goals established at orientation with interventions provided to work toward goal. Personal Goals and Risk Factors at Admission - 03/03/18 1106      Core Components/Risk Factors/Patient Goals on Admission    Weight Management  Yes;Weight Maintenance    Intervention  Weight Management: Develop a combined nutrition and exercise program designed to reach desired caloric intake, while maintaining appropriate intake of nutrient and fiber, sodium and fats, and appropriate energy expenditure required for the weight goal.;Weight Management: Provide education and appropriate resources to help participant work on and attain dietary goals.;Weight Management/Obesity: Establish reasonable short term and long term weight goals.    Admit Weight  192 lb 14.4 oz (87.5 kg)    Expected Outcomes  Short Term: Continue to assess and modify interventions until short term weight is achieved;Long Term: Adherence to nutrition and physical activity/exercise program aimed toward attainment of established weight goal;Weight Maintenance: Understanding of the daily nutrition guidelines, which includes 25-35% calories from fat, 7% or less cal from saturated fats, less than 200mg  cholesterol, less than 1.5gm of sodium, & 5 or more servings of fruits and vegetables daily;Understanding recommendations for meals to include 15-35% energy as protein, 25-35% energy from fat, 35-60% energy from carbohydrates, less than 200mg  of dietary cholesterol, 20-35 gm of total fiber daily;Understanding of distribution of calorie intake throughout the day with the consumption of 4-5 meals/snacks  Tobacco Cessation  Yes    Intervention  Assist the participant in steps to quit. Provide individualized education and counseling about committing to Tobacco Cessation, relapse prevention, and pharmacological support that can be provided by physician.;Ship broker, assist with locating and accessing local/national Quit Smoking programs, and support quit date choice.    Expected Outcomes  Short Term: Will demonstrate readiness to quit, by selecting a quit date.    Diabetes  Yes    Intervention  Provide education about signs/symptoms and action to take for hypo/hyperglycemia.;Provide education about proper nutrition, including hydration, and aerobic/resistive exercise prescription along with prescribed medications to achieve blood glucose in normal ranges: Fasting glucose 65-99 mg/dL    Expected Outcomes  Short Term: Participant verbalizes understanding of the signs/symptoms and immediate care of hyper/hypoglycemia, proper foot care and importance of medication, aerobic/resistive exercise and nutrition plan for blood glucose control.;Long Term: Attainment of HbA1C < 7%.    Hypertension  Yes    Intervention  Provide education on lifestyle modifcations including regular physical activity/exercise, weight management, moderate sodium restriction and increased consumption of fresh fruit, vegetables, and low fat dairy, alcohol moderation, and smoking cessation.;Monitor prescription use compliance.    Expected Outcomes  Short Term: Continued assessment and intervention until BP is < 140/63mm HG in hypertensive participants. < 130/22mm HG in hypertensive participants with diabetes, heart failure or chronic kidney disease.;Long Term: Maintenance of blood pressure at goal levels.    Lipids  Yes    Intervention  Provide education and support for participant on nutrition & aerobic/resistive exercise along with prescribed medications to achieve LDL 70mg , HDL >40mg .    Expected Outcomes  Short Term: Participant states understanding of desired cholesterol values and is compliant with medications prescribed. Participant is following exercise prescription and nutrition guidelines.;Long Term: Cholesterol controlled with medications as prescribed, with  individualized exercise RX and with personalized nutrition plan. Value goals: LDL < 70mg , HDL > 40 mg.    Stress  Yes    Intervention  Offer individual and/or small group education and counseling on adjustment to heart disease, stress management and health-related lifestyle change. Teach and support self-help strategies.;Refer participants experiencing significant psychosocial distress to appropriate mental health specialists for further evaluation and treatment. When possible, include family members and significant others in education/counseling sessions.    Expected Outcomes  Short Term: Participant demonstrates changes in health-related behavior, relaxation and other stress management skills, ability to obtain effective social support, and compliance with psychotropic medications if prescribed.;Long Term: Emotional wellbeing is indicated by absence of clinically significant psychosocial distress or social isolation.        Personal Goals Discharge: Goals and Risk Factor Review    Row Name 03/18/18 1720 07/29/18 1355 08/11/18 1449 08/19/18 1302       Core Components/Risk Factors/Patient Goals Review   Personal Goals Review  Weight Management/Obesity;Lipids;Hypertension;Diabetes;Tobacco Cessation  Weight Management/Obesity;Lipids;Hypertension;Diabetes;Tobacco Cessation  Weight Management/Obesity;Lipids;Hypertension;Diabetes;Tobacco Cessation  Weight Management/Obesity;Lipids;Hypertension;Diabetes;Tobacco Cessation    Review  Dagan is off to a slow but good start to exercise. Zuhayr is checking his own CBG's at home. Patient given a meter. Vital signs stable. Continue to encourage smoking cessation  Patterson returned to exercise on Monday and has been doing well with exercise since his return. Denys has gained a little weight since the COVID 19 pandemic. Alaster is happy to be back at cardiac rehab.  30 Day ITP Review.  Pt has returned to exercise at [facility].  Jettson is currently admitted but was previously tolerating  exercise well.  Pt's expected graduation date is 08/26/2018.  Will follow pt to ensure appropriateness to return to CR.  Pt discharged from [facility] with 13 completed sessions.  Pt was recently hospitalized and follow up appointments will not be completed until after his graduation date.    Expected Outcomes  Alver will continue to partcipate in cardiac rehab for nutrition, exercise and lifestyle modifications.  Manjot will continue to partcipate in cardiac rehab for nutrition, exercise and lifestyle modifications.  Andrews will continue to partcipate in cardiac rehab for nutrition, exercise, and lifestyle modifications.  Earsel will participate in exercise, nutrition, and lifestyle modification opportunities.  Montez plans to exercise at the [facility] when it reopens and walk until then.       Exercise Goals and Review: Exercise Goals    Row Name 03/03/18 1057             Exercise Goals   Increase Physical Activity  Yes       Intervention  Provide advice, education, support and counseling about physical activity/exercise needs.;Develop an individualized exercise prescription for aerobic and resistive training based on initial evaluation findings, risk stratification, comorbidities and participant's personal goals.       Expected Outcomes  Short Term: Attend rehab on a regular basis to increase amount of physical activity.       Increase Strength and Stamina  Yes       Intervention  Provide advice, education, support and counseling about physical activity/exercise needs.;Develop an individualized exercise prescription for aerobic and resistive training based on initial evaluation findings, risk stratification, comorbidities and participant's personal goals.       Expected Outcomes  Short Term: Increase workloads from initial exercise prescription for resistance, speed, and METs.       Able to understand and use rate of perceived exertion (RPE) scale  Yes       Intervention  Provide education and explanation on how to use  RPE scale       Expected Outcomes  Short Term: Able to use RPE daily in rehab to express subjective intensity level;Long Term:  Able to use RPE to guide intensity level when exercising independently       Knowledge and understanding of Target Heart Rate Range (THRR)  Yes       Intervention  Provide education and explanation of THRR including how the numbers were predicted and where they are located for reference       Expected Outcomes  Short Term: Able to state/look up THRR;Long Term: Able to use THRR to govern intensity when exercising independently;Short Term: Able to use daily as guideline for intensity in rehab       Able to check pulse independently  Yes       Intervention  Provide education and demonstration on how to check pulse in carotid and radial arteries.;Review the importance of being able to check your own pulse for safety during independent exercise       Expected Outcomes  Short Term: Able to explain why pulse checking is important during independent exercise;Long Term: Able to check pulse independently and accurately       Understanding of Exercise Prescription  Yes       Intervention  Provide education, explanation, and written materials on patient's individual exercise prescription       Expected Outcomes  Short Term: Able to explain program exercise prescription;Long Term: Able to explain home exercise prescription to exercise independently       Improve claudication pain toleration; Improve  walking ability  Yes       Intervention  Participate in PAD/SET Rehab 2-3 days a week, walking at home as part of exercise prescription;Attend education sessions to aid in risk factor modification and understanding of disease process       Expected Outcomes  Short Term: Improve walking distance/time to onset of claudication pain;Long Term: Improve score of PAD questionnaires;Long Term: Improve walking ability and toleration to claudication          Exercise Goals Re-Evaluation: Exercise  Goals Re-Evaluation    Row Name 03/14/18 1050 03/18/18 1030 03/28/18 1005 04/06/18 1119 08/10/18 1221     Exercise Goal Re-Evaluation   Exercise Goals Review  Increase Physical Activity;Able to understand and use rate of perceived exertion (RPE) scale  Increase Physical Activity;Able to understand and use rate of perceived exertion (RPE) scale  Increase Physical Activity;Able to understand and use rate of perceived exertion (RPE) scale;Understanding of Exercise Prescription;Knowledge and understanding of Target Heart Rate Range (THRR);Improve claudication pain tolerance and improve walking ability  -  Increase Physical Activity;Able to understand and use rate of perceived exertion (RPE) scale;Understanding of Exercise Prescription;Knowledge and understanding of Target Heart Rate Range (THRR);Improve claudication pain tolerance and improve walking ability   Comments  Patient able to understand and use RPE scale appropriately.  Patient understands exercise guidelines and what is expected of him at home. Patient will start walking a couple of days per week outside weather permitting.  Patient is walking 2-3 days/week at the supermarket as his mode of home exercise. Exercise is limited by claudication pain in left leg.  Temporary department closure due to COVID-19.  Patient resumed exercise in cardiac rehab on 07/27/2018, and has been doing well. Pt has been out since 08/03/2018, MD appt, dental appt, and out today 08/10/2018 due to hospital admission for near syncopal episode at home. Unable to review goals at this time.   Expected Outcomes  Progress workloads as tolerated to help increase strength and stamina and be able to walk further.  Patient will add walking outside at home at least 2 days per week.  Continue to increase walking as tolerated to help improve walking ability.   -  Resume exercise when cleared by physician.   [place] Name 08/17/18 1710             Exercise Goal Re-Evaluation   Comments   Patient unable to complete cardiac rehab program due to health issues.          Nutrition & Weight - Outcomes: Pre Biometrics - 03/03/18 1057      Pre Biometrics   Height  5' 7.25" (1.708 m)    Weight  87.5 kg    Waist Circumference  42.5 inches    Hip Circumference  43 inches    Waist to Hip Ratio  0.99 %    BMI (Calculated)  29.99    Triceps Skinfold  14 mm    % Body Fat  29.3 %    Grip Strength  29 kg    Flexibility  0 in    Single Leg Stand  10.75 seconds      Post Biometrics - 08/03/18 1055       Post  Biometrics   Weight  92.6 kg    BMI (Calculated)  31.05       Nutrition: Nutrition Therapy & Goals - 03/03/18 1108      Nutrition Therapy   Diet  heart healthy, carb modified  Personal Nutrition Goals   Nutrition Goal  Pt to identify and limit food sources of saturated fat, trans fat, refined carbohydrates and sodium     Personal Goal #2  Pt to build a healthy plate including vegetables, fruits, whole grains, and low-fat dairy products in a heart healthy meal plan    Personal Goal #3  Pt to eat a variety of non-starchy vegetables    Personal Goal #4  Pt able to name foods that affect blood glucose       Intervention Plan   Intervention  Prescribe, educate and counsel regarding individualized specific dietary modifications aiming towards targeted core components such as weight, hypertension, lipid management, diabetes, heart failure and other comorbidities.    Expected Outcomes  Short Term Goal: Understand basic principles of dietary content, such as calories, fat, sodium, cholesterol and nutrients.;Long Term Goal: Adherence to prescribed nutrition plan.       Nutrition Discharge: Nutrition Assessments - 03/03/18 1112      MEDFICTS Scores   Pre Score  36       Education Questionnaire Score: Knowledge Questionnaire Score - 03/03/18 1101      Knowledge Questionnaire Score   Pre Score  20/24       Goals reviewed with patient; copy given to patient.

## 2018-08-22 ENCOUNTER — Encounter (HOSPITAL_COMMUNITY): Payer: BC Managed Care – PPO

## 2018-08-24 ENCOUNTER — Encounter (HOSPITAL_COMMUNITY): Payer: BC Managed Care – PPO

## 2018-08-26 ENCOUNTER — Encounter (HOSPITAL_COMMUNITY): Payer: BC Managed Care – PPO

## 2018-08-29 ENCOUNTER — Encounter (HOSPITAL_COMMUNITY): Payer: BC Managed Care – PPO

## 2018-08-31 ENCOUNTER — Encounter (HOSPITAL_COMMUNITY): Payer: BC Managed Care – PPO

## 2018-09-02 ENCOUNTER — Encounter (HOSPITAL_COMMUNITY): Payer: BC Managed Care – PPO

## 2018-09-05 ENCOUNTER — Encounter (HOSPITAL_COMMUNITY): Payer: BC Managed Care – PPO

## 2018-09-07 ENCOUNTER — Encounter (HOSPITAL_COMMUNITY): Payer: BC Managed Care – PPO

## 2018-09-09 ENCOUNTER — Ambulatory Visit: Payer: BC Managed Care – PPO | Admitting: Gastroenterology

## 2018-09-09 ENCOUNTER — Encounter (HOSPITAL_COMMUNITY): Payer: BC Managed Care – PPO

## 2018-09-09 DIAGNOSIS — M25551 Pain in right hip: Secondary | ICD-10-CM | POA: Diagnosis not present

## 2018-09-12 ENCOUNTER — Encounter (HOSPITAL_COMMUNITY): Payer: BC Managed Care – PPO

## 2018-09-14 ENCOUNTER — Encounter (HOSPITAL_COMMUNITY): Payer: BC Managed Care – PPO

## 2018-09-16 ENCOUNTER — Inpatient Hospital Stay: Payer: BC Managed Care – PPO | Attending: Hematology and Oncology

## 2018-09-16 ENCOUNTER — Encounter: Payer: Self-pay | Admitting: Gastroenterology

## 2018-09-16 ENCOUNTER — Encounter (HOSPITAL_COMMUNITY): Payer: BC Managed Care – PPO

## 2018-09-16 ENCOUNTER — Telehealth: Payer: Self-pay | Admitting: *Deleted

## 2018-09-16 ENCOUNTER — Other Ambulatory Visit: Payer: Self-pay

## 2018-09-16 ENCOUNTER — Ambulatory Visit: Payer: BC Managed Care – PPO | Admitting: Gastroenterology

## 2018-09-16 ENCOUNTER — Inpatient Hospital Stay (HOSPITAL_BASED_OUTPATIENT_CLINIC_OR_DEPARTMENT_OTHER): Payer: BC Managed Care – PPO | Admitting: Hematology

## 2018-09-16 ENCOUNTER — Telehealth: Payer: Self-pay | Admitting: Gastroenterology

## 2018-09-16 ENCOUNTER — Encounter: Payer: Self-pay | Admitting: Hematology

## 2018-09-16 VITALS — BP 108/62 | HR 95 | Temp 98.6°F | Ht 68.0 in | Wt 193.0 lb

## 2018-09-16 VITALS — BP 135/65 | HR 92 | Temp 98.2°F | Resp 18 | Ht 68.0 in | Wt 194.4 lb

## 2018-09-16 DIAGNOSIS — D72829 Elevated white blood cell count, unspecified: Secondary | ICD-10-CM | POA: Insufficient documentation

## 2018-09-16 DIAGNOSIS — K921 Melena: Secondary | ICD-10-CM

## 2018-09-16 DIAGNOSIS — D45 Polycythemia vera: Secondary | ICD-10-CM | POA: Diagnosis not present

## 2018-09-16 DIAGNOSIS — D72825 Bandemia: Secondary | ICD-10-CM

## 2018-09-16 DIAGNOSIS — Z79899 Other long term (current) drug therapy: Secondary | ICD-10-CM | POA: Insufficient documentation

## 2018-09-16 DIAGNOSIS — D473 Essential (hemorrhagic) thrombocythemia: Secondary | ICD-10-CM | POA: Diagnosis not present

## 2018-09-16 DIAGNOSIS — Z951 Presence of aortocoronary bypass graft: Secondary | ICD-10-CM

## 2018-09-16 DIAGNOSIS — D75839 Thrombocytosis, unspecified: Secondary | ICD-10-CM

## 2018-09-16 DIAGNOSIS — D649 Anemia, unspecified: Secondary | ICD-10-CM

## 2018-09-16 DIAGNOSIS — K219 Gastro-esophageal reflux disease without esophagitis: Secondary | ICD-10-CM

## 2018-09-16 DIAGNOSIS — I2510 Atherosclerotic heart disease of native coronary artery without angina pectoris: Secondary | ICD-10-CM | POA: Insufficient documentation

## 2018-09-16 DIAGNOSIS — Z9889 Other specified postprocedural states: Secondary | ICD-10-CM

## 2018-09-16 DIAGNOSIS — K449 Diaphragmatic hernia without obstruction or gangrene: Secondary | ICD-10-CM | POA: Diagnosis not present

## 2018-09-16 DIAGNOSIS — Z87891 Personal history of nicotine dependence: Secondary | ICD-10-CM

## 2018-09-16 DIAGNOSIS — I350 Nonrheumatic aortic (valve) stenosis: Secondary | ICD-10-CM | POA: Insufficient documentation

## 2018-09-16 LAB — CBC WITH DIFFERENTIAL (CANCER CENTER ONLY)
Abs Immature Granulocytes: 5.77 10*3/uL — ABNORMAL HIGH (ref 0.00–0.07)
Basophils Absolute: 0.5 10*3/uL — ABNORMAL HIGH (ref 0.0–0.1)
Basophils Relative: 1 %
Eosinophils Absolute: 0.2 10*3/uL (ref 0.0–0.5)
Eosinophils Relative: 0 %
HCT: 37.6 % — ABNORMAL LOW (ref 39.0–52.0)
Hemoglobin: 11.4 g/dL — ABNORMAL LOW (ref 13.0–17.0)
Immature Granulocytes: 11 %
Lymphocytes Relative: 4 %
Lymphs Abs: 2.1 10*3/uL (ref 0.7–4.0)
MCH: 28.6 pg (ref 26.0–34.0)
MCHC: 30.3 g/dL (ref 30.0–36.0)
MCV: 94.5 fL (ref 80.0–100.0)
Monocytes Absolute: 3.6 10*3/uL — ABNORMAL HIGH (ref 0.1–1.0)
Monocytes Relative: 7 %
Neutro Abs: 40.9 10*3/uL — ABNORMAL HIGH (ref 1.7–7.7)
Neutrophils Relative %: 77 %
Platelet Count: 649 10*3/uL — ABNORMAL HIGH (ref 150–400)
RBC: 3.98 MIL/uL — ABNORMAL LOW (ref 4.22–5.81)
RDW: 21.2 % — ABNORMAL HIGH (ref 11.5–15.5)
WBC Count: 53.1 10*3/uL (ref 4.0–10.5)
nRBC: 0.2 % (ref 0.0–0.2)

## 2018-09-16 LAB — CMP (CANCER CENTER ONLY)
ALT: 7 U/L (ref 0–44)
AST: 12 U/L — ABNORMAL LOW (ref 15–41)
Albumin: 3.8 g/dL (ref 3.5–5.0)
Alkaline Phosphatase: 120 U/L (ref 38–126)
Anion gap: 9 (ref 5–15)
BUN: 17 mg/dL (ref 8–23)
CO2: 27 mmol/L (ref 22–32)
Calcium: 9.7 mg/dL (ref 8.9–10.3)
Chloride: 100 mmol/L (ref 98–111)
Creatinine: 0.9 mg/dL (ref 0.61–1.24)
GFR, Est AFR Am: >60 mL/min (ref >60)
GFR, Estimated: >60 mL/min (ref >60)
Glucose, Bld: 102 mg/dL — ABNORMAL HIGH (ref 70–99)
Potassium: 4.4 mmol/L (ref 3.5–5.1)
Sodium: 136 mmol/L (ref 135–145)
Total Bilirubin: 0.5 mg/dL (ref 0.3–1.2)
Total Protein: 7.9 g/dL (ref 6.5–8.1)

## 2018-09-16 LAB — SAVE SMEAR(SSMR), FOR PROVIDER SLIDE REVIEW

## 2018-09-16 MED ORDER — PANTOPRAZOLE SODIUM 40 MG PO TBEC: DELAYED_RELEASE_TABLET | ORAL | 3 refills | Status: DC

## 2018-09-16 MED ORDER — HYDROXYUREA 500 MG PO CAPS: 500.0000 mg | ORAL_CAPSULE | Freq: Two times a day (BID) | ORAL | 2 refills | Status: DC

## 2018-09-16 NOTE — Telephone Encounter (Signed)
Pt stated that insurance requires a PA for pantoprazole.

## 2018-09-16 NOTE — Progress Notes (Signed)
The patient requested Dr. Lorette Ang office note from today to be faxed to [place], Counsellor. Fax # [phone]. Phone # [phone]. Received faxed confirmation.

## 2018-09-16 NOTE — Patient Instructions (Signed)
We have sent the following medications to your pharmacy for you to pick up at your convenience:   Normal BMI (Body Mass Index- based on height and weight) is between 19 and 25. Your BMI today is Body mass index is 29.35 kg/m. Marland Kitchen Please consider follow up  regarding your BMI with your Primary Care Provider.  Thank you for entrusting me with your care and choosing [facility].  Dr Bryan Lemma

## 2018-09-16 NOTE — Progress Notes (Signed)
P  Chief Complaint:    GERD, PUD  GI History: 64 y.o. male with history of polycythemia vera, thrombocytosis, chronic anemia (hemoglobin baseline 11), CAD s/p four-vessel CABG 12/2017, moderate to severe left ear s/p aortic valve replacement and left atrial clip 12/2017 initially referred to the [facility] in 06/2018 for evaluation of coffee-ground secretions.  States that he has intermittent "dark film" that "leaks from the mouth "and staining the pillowcase/beard.  This has occurred intermittently since his surgery in 12/2017.  No forceful emesis.  No nausea.  No melena or hematochezia.  Additionally, he has a longstanding history of intermittent reflux symptoms, that he has treated with OTC antacids prn.  Has had increasing reflux symptoms, described as mainly nocturnal symptoms of regurgitation, belching. + Hoarseness. Denies dysphagia or odynophagia.  Endoscopic history: - Colonoscopy will be approximately 2018 by Dr. Penelope Coop at [facility].  Patient reports benign polyps.  No report in EMR. -EGD (07/2018, Dr. Silverio Decamp): Small HH, many cratered and linear gastric ulcers, largest 10 mm.  Treated with high-dose PPI  HPI:    Patient is a 63 y.o. male presenting to the [facility]astroenterology Clinic for follow-up.  Was initially seen by me in 06/2018 with coffee-ground secretions.  Was treated with high-dose PPI, with resolution of coffee-ground emesis and no melena in late June.  However, he was admitted 7/21-25 with lightheadedness, dizziness, intermittent melena, and ongoing coffee-ground secretions.  Hemoglobin 4.2 on admission (from 11.2 in 06/2018).  Hemoglobin decreased to 3.4 on 7/22.  Iron 16, sat 6%, TIBC 279, ferritin 61.  Treated with Protonix gtt., 5 unit PRBCs with discharge hemoglobin 9.9 on 7/25.  EGD with multiple cratered and linear gastric ulcers, with the largest being 10 mm.  No endoscopic intervention needed, and treated with high-dose PPI.  Plan to restart Eliquis 3 days  after discharge, hold all NSAIDs, continue high-dose PPI, and follow-up with GI and Dr. Maylon Peppers.    Repeat hemoglobin 9.5 on 7/28.  Was seen by Dr. Maylon Peppers on 7/28.  Continued Hydrea for PV. Has f.u with Cardiology next week.  Scheduled for lab recheck this afternoon.  Today he states that he feels back to his baseline, feeling well.  Stools are back to normal, no longer melenic. Taking high dose PPI as prescribed. Tolerating all PO intake.  Takes ASA 81, otherwise no NSAIDs.  Has restarted his Eliquis.   Review of systems:     No chest pain, no SOB, no fevers, no urinary sx   Past Medical History:  Diagnosis Date  . Acute meniscal tear of knee LEFT  . Aortic stenosis 12/01/2017   NONRHEUMATIC, AORTIC VALVE CALCIFICATIONS, MILD TO MODERATE REGURG, MILD TO MODERATE CALCIFIED ANNULUS per ECHO 10/25/17 @ [facility]  . Arthritis   . Atrial fibrillation ([place]) 11/12/2017   AT O/V WITH PCP  . DM (diabetes mellitus) ([place])   . Heart murmur MILD-- ASYMPTOMATIC  . Hyperlipidemia   . Hypertension   . Left knee pain   . PAD (peripheral artery disease) ([facility])    left leg claudication    Patient's surgical history, family medical history, social history, medications and allergies were all reviewed in Epic    Current Outpatient Medications  Medication Sig Dispense Refill  . amoxicillin (AMOXIL) 500 MG capsule Take 1 capsule (500 mg total) by mouth once as needed for up to 4 doses. Take four 500 mg capsules by mouth 1 hour before dental visits 4 capsule 12  . apixaban (ELIQUIS) 5  MG TABS tablet Take 1 tablet (5 mg total) by mouth 2 (two) times daily. 60 tablet   . aspirin 81 MG tablet Take 81 mg by mouth daily.     . Cholecalciferol 125 MCG (5000 UT) capsule Take 1 capsule by mouth daily.     . cyclobenzaprine (FLEXERIL) 10 MG tablet Take 10 mg by mouth 3 (three) times daily.     . empagliflozin (JARDIANCE) 10 MG TABS tablet Take 10 mg by mouth daily. 30 tablet 11  . ferrous sulfate 325 (65  FE) MG tablet Take 1 tablet (325 mg total) by mouth 2 (two) times daily with a meal. 60 tablet 0  . fluticasone (FLONASE) 50 MCG/ACT nasal spray Place 2 sprays into both nostrils daily.    . furosemide (LASIX) 20 MG tablet Take 20 mg by mouth daily as needed for fluid.     Marland Kitchen loratadine (CLARITIN) 10 MG tablet Take 10 mg by mouth daily.    . metFORMIN (GLUCOPHAGE-XR) 500 MG 24 hr tablet Take 500 mg by mouth daily before supper.    . metoprolol succinate (TOPROL XL) 25 MG 24 hr tablet Take 0.5 tablets (12.5 mg total) by mouth daily. 30 tablet 0  . nefazodone (SERZONE) 100 MG tablet Take 100 mg by mouth 2 (two) times daily.    . pantoprazole (PROTONIX) 40 MG tablet Take 1 tablet (40 mg total) by mouth 2 (two) times daily before a meal. Take 1 tab 40 mg BID x 3 months then 1 tab daily. 120 tablet 1  . POTASSIUM CHLORIDE PO Take 1 tablet by mouth daily as needed (when taking lasix).     . pregabalin (LYRICA) 50 MG capsule Take 50 mg by mouth 3 (three) times daily.    . rosuvastatin (CRESTOR) 20 MG tablet Take 1 tablet (20 mg total) by mouth daily. 90 tablet 3  . zolpidem (AMBIEN) 10 MG tablet Take 10 mg by mouth at bedtime.      No current facility-administered medications for this visit.     Physical Exam:     There were no vitals taken for this visit.  GENERAL:  Pleasant male in NAD PSYCH: : Cooperative, normal affect EENT:  conjunctiva pink, mucous membranes moist, neck supple without masses CARDIAC: Irregularly irregular, regular rate, 3/6 SEM, no peripheral edema PULM: Normal respiratory effort, lungs CTA bilaterally, no wheezing ABDOMEN:  Nondistended, soft, nontender. No obvious masses, no hepatomegaly,  normal bowel sounds SKIN:  turgor, no lesions seen Musculoskeletal:  Normal muscle tone, normal strength NEURO: Alert and oriented x 3, no focal neurologic deficits   IMPRESSION and PLAN:    1) GERD 2) Peptic ulcer disease 3) Acute blood loss anemia  -Resume Protonix 40 mg  p.o. twice daily for another 2 months, then can reduce to 40 mg daily for ongoing control of reflux and gastric prophylaxis  -Discussed utility of repeat endoscopy to evaluate for appropriate mucosal healing.  Given his relatively recent CABG, lack of ongoing symptoms, and pending stabilization of hemoglobin on today's labs, feel that following clinically rather than repeat endoscopy is reasonable - Recommended HOB elevation -Resume antireflux lifestyle measures - Repeat CBC this afternoon as already scheduled -Melena resolved  4) CAD s/p four-vessel CABG 12/2017 5) History of carotid endarterectomy -Antiplatelet/anticoagulation as prescribed -Recommend long-term PPI for continued gastric prophylaxis as above  6) Polycythemia vera/Thrombocytosis: -Follows with Hematology  7) History of tobacco use: -Applauded his continued abstinence from smoking  RTC in 3 months or sooner as needed  Lavena Bullion ,DO, FACG 09/16/2018, 8:36 AM

## 2018-09-16 NOTE — Progress Notes (Signed)
[facility] OFFICE PROGRESS NOTE  Patient Care Team: [name], MD as PCP - General (Family Medicine) Buford Dresser, MD as PCP - Cardiology (Cardiology)  HEME/ONC OVERVIEW: 1. Polycythemia vera, JAK2 V617 mutation+; high risk (age >20) -Previous patient of Dr. Audelia Hives  -11/2017: Hgb ~17; WBC ~30k w/ nl diff, plts ~500k; NGS showed JAK2 V617F mutation (83% allele frequency), CAL-R mutation (4% allele frequency); BCR/ALB negative; no bone marrow biopsy done at diagnosis  -11/2017 - present: Hydrea, see dosing below; phlebotomy x 1 in 12/2017  TREATMENT REGIMEN:  12/03/2017 - present: Hydrea, currently 564m TID  PRN phlebotomy, most recent on 12/23/2017  PERTINENT NON-HEM/ONC PROBLEMS: 1. CAD s/p CABG x 4 in 12/2017 2. Moderate to severe aortic stenosis s/p aortic valve replacement and left atrial clip in 12/2017   ASSESSMENT & PLAN:   Polycythemia vera, JAK2+; high risk  -Previous patient of Dr. RAudelia Hives-Hgb 11.4 today, improving since the last visit -Interestingly, his WBC and platelet count have continued to rise, which raises the question of accuracy of the PV diagnosis initially, as we would have expected some progressive polycythemia in the absence of phlebotomy or cytoreductive therapy -Therefore, I discussed with the patient about pursuing bone marrow biopsy to further assess any erythroid vs. megakaryocyte proliferation in order to better define the underlying myeloproliferative neoplasm -We discussed some of the potential risks of bone marrow bx, including but not limited to, bleeding, infection, and injury to nearby vessels and organs; patient expressed understanding, and was agreeable to proceed with the procedure -I have reached out to LWilber BihariNP to set up a date for outpatient bone marrow biopsy -We will review the bone marrow biopsy in 2 to 3 weeks and to determine the next step of plan -On ASA 889mdaily   Leukocytosis -Most likely  secondary to PV and ongoing steroid use  -WBC 5386.7Eith neutrophilic predominance, significantly higher than the last visit -I personally reviewed the patient's peripheral blood smear, which showed normal WBC morphology with progressive maturation and no evidence of dysplasia.  There was no circulating blasts. -Patient reports that he started a prednisone taper over a week ago (30 mg x 3 days, 20 mg x 5 days) and is currently on prednisone 10 mg daily for 5 days -Clinically, he denies any symptoms of infection -Bone marrow biopsy as above -As I suspect his leukocytosis is most likely due to ongoing steroid use, we will hold off resuming Hydrea at this time -We will repeat labs in 3 weeks to monitor WBC trend  Thrombocytosis -Likely due to underlying myeloproliferative neoplasm -As discussed above, the presence of leukocytosis and thrombocytosis without polycythemia raises the question regarding accuracy of the PV diagnosis -Therefore, we will plan to pursue bone marrow biopsy as outlined above -We will repeat labs in 3 weeks to monitor platelet trend and to determine if we need to resume Hydrea  No orders of the defined types were placed in this encounter.  All questions were answered. The patient knows to call the clinic with any problems, questions or concerns. No barriers to learning was detected.  A total of more than 25 minutes were spent face-to-face with the patient during this encounter and over half of that time was spent on counseling and coordination of care as outlined above.   Return in 3 weeks for labs and clinic follow-up.  Justin MenMD 09/16/2018 3:54 PM  CHIEF COMPLAINT: "I have some hip pain"  INTERVAL HISTORY: Justin [name] to  clinic for follow-up of myeloproliferative neoplasm, suggestive of PV.  He reports that since last visit, he has had some intermittent right hip pain and saw an orthopedic surgeon, who told him that he has moderate to severe osteoarthritis  and recommended hip replacement.  In the setting of ongoing COVID outbreak, he declined moving forward with surgery.  He was prescribed a course of prednisone, which he is taking 30 mg daily x3 days and 20 mg daily x5 days.  He just started taking prednisone 10 mg daily today for 5 days.  He denies any symptoms of recurrent GI bleeding, such as abdominal pain, nausea, vomiting, diarrhea, hematochezia, or melena.  He denies any other complaint today.  REVIEW OF SYSTEMS:   Constitutional: ( - ) fevers, ( - )  chills , ( - ) night sweats Eyes: ( - ) blurriness of vision, ( - ) double vision, ( - ) watery eyes Ears, nose, mouth, throat, and face: ( - ) mucositis, ( - ) sore throat Respiratory: ( - ) cough, ( - ) dyspnea, ( - ) wheezes Cardiovascular: ( - ) palpitation, ( - ) chest discomfort, ( - ) lower extremity swelling Gastrointestinal:  ( - ) nausea, ( - ) heartburn, ( - ) change in bowel habits Skin: ( - ) abnormal skin rashes Lymphatics: ( - ) new lymphadenopathy, ( - ) easy bruising Neurological: ( - ) numbness, ( - ) tingling, ( - ) new weaknesses Behavioral/Psych: ( - ) mood change, ( - ) new changes  All other systems were reviewed with the patient and are negative.  I have reviewed the past medical history, past surgical history, social history and family history with the patient and they are unchanged from previous note.  ALLERGIES:  is allergic to codeine.  MEDICATIONS:  Current Outpatient Medications  Medication Sig Dispense Refill  . amoxicillin (AMOXIL) 500 MG capsule Take 1 capsule (500 mg total) by mouth once as needed for up to 4 doses. Take four 500 mg capsules by mouth 1 hour before dental visits 4 capsule 12  . apixaban (ELIQUIS) 5 MG TABS tablet Take 1 tablet (5 mg total) by mouth 2 (two) times daily. 60 tablet   . aspirin 81 MG tablet Take 81 mg by mouth daily.     . Cholecalciferol 125 MCG (5000 UT) capsule Take 1 capsule by mouth daily.     . cyclobenzaprine  (FLEXERIL) 10 MG tablet Take 10 mg by mouth 3 (three) times daily.     . empagliflozin (JARDIANCE) 10 MG TABS tablet Take 10 mg by mouth daily. 30 tablet 11  . ferrous sulfate 325 (65 FE) MG tablet Take 1 tablet (325 mg total) by mouth 2 (two) times daily with a meal. 60 tablet 0  . fluticasone (FLONASE) 50 MCG/ACT nasal spray Place 2 sprays into both nostrils daily.    . furosemide (LASIX) 20 MG tablet Take 20 mg by mouth daily as needed for fluid.     Marland Kitchen loratadine (CLARITIN) 10 MG tablet Take 10 mg by mouth daily.    . metFORMIN (GLUCOPHAGE-XR) 500 MG 24 hr tablet Take 500 mg by mouth daily before supper.    . metoprolol succinate (TOPROL XL) 25 MG 24 hr tablet Take 0.5 tablets (12.5 mg total) by mouth daily. 30 tablet 0  . nefazodone (SERZONE) 100 MG tablet Take 100 mg by mouth 2 (two) times daily.    . pantoprazole (PROTONIX) 40 MG tablet Take 1 tablet (40 mg  total) by mouth 2 (two) times daily before a meal. Take 1 tab 40 mg BID x 3 months then 1 tab daily. 120 tablet 1  . pantoprazole (PROTONIX) 40 MG tablet Take 1 tablets twice daily for 8 weeks stop on 11/16/18 then take 1 tablet daily 90 tablet 3  . POTASSIUM CHLORIDE PO Take 1 tablet by mouth daily as needed (when taking lasix).     . pregabalin (LYRICA) 50 MG capsule Take 50 mg by mouth 3 (three) times daily.    . rosuvastatin (CRESTOR) 20 MG tablet Take 1 tablet (20 mg total) by mouth daily. 90 tablet 3  . zolpidem (AMBIEN) 10 MG tablet Take 10 mg by mouth at bedtime.     . hydroxyurea (HYDREA) 500 MG capsule Take 1 capsule (500 mg total) by mouth 2 (two) times daily. May take with food to minimize GI side effects. 60 capsule 2   No current facility-administered medications for this visit.     PHYSICAL EXAMINATION: ECOG PERFORMANCE STATUS: 1 - Symptomatic but completely ambulatory  Today's Vitals   09/16/18 1527  BP: 135/65  Pulse: 92  Resp: 18  Temp: 98.2 F (36.8 C)  TempSrc: Temporal  SpO2: 98%  Weight: 194 lb 6.4 oz  (88.2 kg)  Height: 5' 8"  (1.727 m)  PainSc: 0-No pain   Body mass index is 29.56 kg/m.  Filed Weights   09/16/18 1527  Weight: 194 lb 6.4 oz (88.2 kg)    GENERAL: alert, no distress and comfortable SKIN: skin color, texture, turgor are normal, no rashes or significant lesions EYES: conjunctiva are pink and non-injected, sclera clear OROPHARYNX: no exudate, no erythema; lips, buccal mucosa, and tongue normal  NECK: supple, non-tender LUNGS: clear to auscultation with normal breathing effort HEART: regular rate & rhythm and no murmurs and no lower extremity edema ABDOMEN: soft, non-tender, non-distended, normal bowel sounds Musculoskeletal: no cyanosis of digits and no clubbing  PSYCH: alert & oriented x 3, fluent speech NEURO: no focal motor/sensory deficits  LABORATORY DATA:  I have reviewed the data as listed    Component Value Date/Time   NA 136 09/16/2018 1459   K 4.4 09/16/2018 1459   CL 100 09/16/2018 1459   CO2 27 09/16/2018 1459   GLUCOSE 102 (H) 09/16/2018 1459   BUN 17 09/16/2018 1459   CREATININE 0.90 09/16/2018 1459   CALCIUM 9.7 09/16/2018 1459   PROT 7.9 09/16/2018 1459   ALBUMIN 3.8 09/16/2018 1459   AST 12 (L) 09/16/2018 1459   ALT 7 09/16/2018 1459   ALKPHOS 120 09/16/2018 1459   BILITOT 0.5 09/16/2018 1459   GFRNONAA >60 09/16/2018 1459   GFRAA >60 09/16/2018 1459    No results found for: SPEP, UPEP  Lab Results  Component Value Date   WBC 53.1 (HH) 09/16/2018   NEUTROABS 40.9 (H) 09/16/2018   HGB 11.4 (L) 09/16/2018   HCT 37.6 (L) 09/16/2018   MCV 94.5 09/16/2018   PLT 649 (H) 09/16/2018      Chemistry      Component Value Date/Time   NA 136 09/16/2018 1459   K 4.4 09/16/2018 1459   CL 100 09/16/2018 1459   CO2 27 09/16/2018 1459   BUN 17 09/16/2018 1459   CREATININE 0.90 09/16/2018 1459      Component Value Date/Time   CALCIUM 9.7 09/16/2018 1459   ALKPHOS 120 09/16/2018 1459   AST 12 (L) 09/16/2018 1459   ALT 7 09/16/2018 1459    BILITOT 0.5 09/16/2018 1459  RADIOGRAPHIC STUDIES: I have personally reviewed the radiological images as listed below and agreed with the findings in the report. No results found.

## 2018-09-16 NOTE — Telephone Encounter (Signed)
Richardson Landry in lab brought me a critical lab value of WBC 53.1. MD notified.

## 2018-09-19 ENCOUNTER — Telehealth: Payer: Self-pay | Admitting: Adult Health

## 2018-09-19 ENCOUNTER — Encounter (HOSPITAL_COMMUNITY): Payer: BC Managed Care – PPO

## 2018-09-19 LAB — LACTATE DEHYDROGENASE: LDH: 522 U/L — ABNORMAL HIGH (ref 98–192)

## 2018-09-19 LAB — FERRITIN: Ferritin: 48 ng/mL (ref 24–336)

## 2018-09-19 LAB — IRON AND TIBC
Iron: 16 ug/dL — ABNORMAL LOW (ref 42–163)
Saturation Ratios: 5 % — ABNORMAL LOW (ref 20–55)
TIBC: 288 ug/dL (ref 202–409)
UIBC: 272 ug/dL (ref 117–376)

## 2018-09-19 LAB — SOLUBLE TRANSFERRIN RECEPTOR: Transferrin Receptor: 79.6 nmol/L — ABNORMAL HIGH (ref 12.2–27.3)

## 2018-09-19 NOTE — Addendum Note (Signed)
Encounter addended by: Noel Christmas, RN on: 09/19/2018 4:45 PM  Actions taken: Episode resolved

## 2018-09-19 NOTE — Telephone Encounter (Signed)
Added bmbx 9/10 per 8/31 schedule message. Per message patient aware. Hold already placed on LC's scheduled at 8 am 9/10.

## 2018-09-20 ENCOUNTER — Encounter

## 2018-09-20 ENCOUNTER — Ambulatory Visit: Payer: BC Managed Care – PPO | Admitting: Hematology

## 2018-09-20 ENCOUNTER — Other Ambulatory Visit: Payer: Self-pay

## 2018-09-20 ENCOUNTER — Ambulatory Visit: Payer: BC Managed Care – PPO | Admitting: Cardiology

## 2018-09-20 ENCOUNTER — Encounter: Payer: Self-pay | Admitting: Cardiology

## 2018-09-20 ENCOUNTER — Other Ambulatory Visit: Payer: BC Managed Care – PPO

## 2018-09-20 VITALS — BP 88/48 | HR 100 | Ht 68.0 in | Wt 197.8 lb

## 2018-09-20 DIAGNOSIS — I4821 Permanent atrial fibrillation: Secondary | ICD-10-CM | POA: Diagnosis not present

## 2018-09-20 DIAGNOSIS — Z951 Presence of aortocoronary bypass graft: Secondary | ICD-10-CM | POA: Diagnosis not present

## 2018-09-20 DIAGNOSIS — Z952 Presence of prosthetic heart valve: Secondary | ICD-10-CM | POA: Diagnosis not present

## 2018-09-20 DIAGNOSIS — I2583 Coronary atherosclerosis due to lipid rich plaque: Secondary | ICD-10-CM

## 2018-09-20 DIAGNOSIS — D45 Polycythemia vera: Secondary | ICD-10-CM

## 2018-09-20 DIAGNOSIS — Z9889 Other specified postprocedural states: Secondary | ICD-10-CM

## 2018-09-20 DIAGNOSIS — I2510 Atherosclerotic heart disease of native coronary artery without angina pectoris: Secondary | ICD-10-CM | POA: Diagnosis not present

## 2018-09-20 DIAGNOSIS — I739 Peripheral vascular disease, unspecified: Secondary | ICD-10-CM | POA: Insufficient documentation

## 2018-09-20 NOTE — Patient Instructions (Signed)

## 2018-09-20 NOTE — Progress Notes (Signed)
Cardiology Office Note:    Date:  09/20/2018   ID:  Justin [name], DOB 09/08/1955, MRN [id number]  PCP:  [name], MD  Cardiologist:  Buford Dresser, MD PhD  Referring MD: [name], MD   CC: post hospitalization follow up  History of Present Illness:    Justin [name] is a 63 y.o. male with a hx of obesity, diabetes, tobacco use, hypertension, peripheral vascular disease who is seen in follow up for atrial fibrillation and s/p R CEA and CABG with AVR 01/03/18 for bicuspid aortic valve stenosis. My initial visit with him was on 12/07/17.   Today:  Recent hospital admission for severe anemia, Hgb 4.2. EGD showed many cratered and linear gastric ulcers. Received 5 U PRBC, treated with high dose PPI. Restarted on apixaban, aspirin 81 mg but stopped all other NSAIDs. Discharged 08/13/18.  He is applying for long term disability given his recent cardiac surgery, multiple cardiac issues, and recent hospitalization for severe anemia requiring transfusion. He is debilitated and limited in his activity due to these issues.   Remains tobacco free. No NSAIDs given ulcers seen on EGD, recommend no NSAIDs again for pain. Recommend he talk to his team about alternative pain control strategies.   No additional melena since hospitalization. Checks daily. No hematemesis.  PAD: 30 mmHg difference between arms today, left 88/48 and right 118/68. Known stenotic disease on dopplers from 12/2017. Noted to have severely dampened flow throughout. L brachial pressures also noted as severely reduced from R arm at the time of dopplers. Remains with claudication, though trying to walk through it. R leg with numbness from vein harvest.   Denies chest pain, shortness of breath at rest or with normal exertion. No PND, orthopnea, LE edema or unexpected weight gain. No syncope or palpitations.  Past Medical History:  Diagnosis Date  . Acute meniscal tear of knee LEFT  . Aortic stenosis  12/01/2017   NONRHEUMATIC, AORTIC VALVE CALCIFICATIONS, MILD TO MODERATE REGURG, MILD TO MODERATE CALCIFIED ANNULUS per ECHO 10/25/17 @ [facility]  . Arthritis   . Atrial fibrillation ([place]) 11/12/2017   AT O/V WITH PCP  . DM (diabetes mellitus) ([place])   . Heart murmur MILD-- ASYMPTOMATIC  . Hyperlipidemia   . Hypertension   . Left knee pain   . PAD (peripheral artery disease) ([facility])    left leg claudication    Past Surgical History:  Procedure Laterality Date  . AORTIC VALVE REPLACEMENT N/A 01/03/2018   Procedure: AORTIC VALVE REPLACEMENT (AVR) using 9mm Magna Ease Bioprosthesis Aortic Valve;  Surgeon: Ivin Poot, MD;  Location: [facility];  Service: Open Heart Surgery;  Laterality: N/A;  . APPENDECTOMY  1998  . BIOPSY  08/11/2018   Procedure: BIOPSY;  Surgeon: Mauri Pole, MD;  Location: [facility];  Service: Endoscopy;;  . CATARACT EXTRACTION W/ INTRAOCULAR LENS  IMPLANT, BILATERAL  1998/  2000  . CERVICAL FUSION  1985   C4 - 5  . CORONARY ARTERY BYPASS GRAFT N/A 01/03/2018   Procedure: CORONARY ARTERY BYPASS GRAFTING (CABG) x 4 (LIMA to LAD, SVG to DIAGONAL, SVG to RAMUS INTERMEDIATE, and SVG to PDA), USING LEFT INFTERNAL MAMMARY ARTERY AND;  Surgeon: Prescott Gum, Collier Salina, MD;  Location: [facility];  Service: Open Heart Surgery;  Laterality: N/A;  . ENDARTERECTOMY Right 01/03/2018   Procedure: ENDARTERECTOMY CAROTID;  Surgeon: Marty Heck, MD;  Location: [facility];  Service: Vascular;  Laterality: Right;  . ESOPHAGOGASTRODUODENOSCOPY (EGD) WITH PROPOFOL N/A 08/11/2018  Procedure: ESOPHAGOGASTRODUODENOSCOPY (EGD) WITH PROPOFOL;  Surgeon: Mauri Pole, MD;  Location: [place] [facility];  Service: Endoscopy;  Laterality: N/A;  . KNEE ARTHROSCOPY W/ MENISCECTOMY  1991   LEFT KNEE  . NASAL SINUS SURGERY  1982  . RIGHT/LEFT HEART CATH AND CORONARY ANGIOGRAPHY N/A 12/24/2017   Procedure: RIGHT/LEFT HEART CATH AND CORONARY ANGIOGRAPHY;  Surgeon: Jolaine Artist, MD;   Location: [facility];  Service: Cardiovascular;  Laterality: N/A;  . ROTATOR CUFF REPAIR  09-04-2005   LEFT SHOULDER  . TEE WITHOUT CARDIOVERSION N/A 12/24/2017   Procedure: TRANSESOPHAGEAL ECHOCARDIOGRAM (TEE);  Surgeon: Jolaine Artist, MD;  Location: [facility];  Service: Cardiovascular;  Laterality: N/A;  . TEE WITHOUT CARDIOVERSION N/A 01/03/2018   Procedure: TRANSESOPHAGEAL ECHOCARDIOGRAM (TEE);  Surgeon: Prescott Gum, Collier Salina, MD;  Location: [place];  Service: Open Heart Surgery;  Laterality: N/A;  . TONSILLECTOMY  AGE 89    Current Medications: Current Outpatient Medications on File Prior to Visit  Medication Sig  . amoxicillin (AMOXIL) 500 MG capsule Take 1 capsule (500 mg total) by mouth once as needed for up to 4 doses. Take four 500 mg capsules by mouth 1 hour before dental visits  . apixaban (ELIQUIS) 5 MG TABS tablet Take 1 tablet (5 mg total) by mouth 2 (two) times daily.  Marland Kitchen aspirin 81 MG tablet Take 81 mg by mouth daily.   . Cholecalciferol 125 MCG (5000 UT) capsule Take 1 capsule by mouth daily.   . cyclobenzaprine (FLEXERIL) 10 MG tablet Take 10 mg by mouth 3 (three) times daily.   . empagliflozin (JARDIANCE) 10 MG TABS tablet Take 10 mg by mouth daily.  . ferrous sulfate 325 (65 FE) MG tablet Take 1 tablet (325 mg total) by mouth 2 (two) times daily with a meal.  . fluticasone (FLONASE) 50 MCG/ACT nasal spray Place 2 sprays into both nostrils daily.  . furosemide (LASIX) 20 MG tablet Take 20 mg by mouth daily as needed for fluid.   Marland Kitchen loratadine (CLARITIN) 10 MG tablet Take 10 mg by mouth daily.  . metFORMIN (GLUCOPHAGE-XR) 500 MG 24 hr tablet Take 500 mg by mouth daily before supper.  . metoprolol succinate (TOPROL XL) 25 MG 24 hr tablet Take 0.5 tablets (12.5 mg total) by mouth daily.  . nefazodone (SERZONE) 100 MG tablet Take 100 mg by mouth 2 (two) times daily.  . pantoprazole (PROTONIX) 40 MG tablet Take 1 tablet (40 mg total) by mouth 2 (two) times daily before  a meal. Take 1 tab 40 mg BID x 3 months then 1 tab daily.  Marland Kitchen POTASSIUM CHLORIDE PO Take 1 tablet by mouth daily as needed (when taking lasix).   . pregabalin (LYRICA) 50 MG capsule Take 50 mg by mouth 3 (three) times daily.  . rosuvastatin (CRESTOR) 20 MG tablet Take 1 tablet (20 mg total) by mouth daily.  Marland Kitchen zolpidem (AMBIEN) 10 MG tablet Take 10 mg by mouth at bedtime.    No current facility-administered medications on file prior to visit.     Allergies:   Codeine   Social History   Socioeconomic History  . Marital status: Married    Spouse name: Not on file  . Number of children: Not on file  . Years of education: Not on file  . Highest education level: Not on file  Occupational History  . Not on file  Social Needs  . Financial resource strain: Not on file  . Food insecurity    Worry: Not  on file    Inability: Not on file  . Transportation needs    Medical: Not on file    Non-medical: Not on file  Tobacco Use  . Smoking status: Former Smoker    Packs/day: 0.25    Years: 23.00    Pack years: 5.75    Types: Cigars    Quit date: 01/02/2018    Years since quitting: 0.7  . Smokeless tobacco: Never Used  . Tobacco comment: quit cigs in 2011 and smokes cigars daily- from 3-10 cigars-09/27/13  Substance and Sexual Activity  . Alcohol use: Not Currently    Alcohol/week: 0.0 standard drinks    Comment: occasional  . Drug use: No  . Sexual activity: Not on file  Lifestyle  . Physical activity    Days per week: Not on file    Minutes per session: Not on file  . Stress: Not on file  Relationships  . Social Herbalist on phone: Not on file    Gets together: Not on file    Attends religious service: Not on file    Active member of club or organization: Not on file    Attends meetings of clubs or organizations: Not on file    Relationship status: Not on file  Other Topics Concern  . Not on file  Social History Narrative  . Not on file     Family History:  Family history unknown as he was adopted.  ROS:   Please see the history of present illness.  Additional pertinent ROS: Constitutional: Negative for chills, fever, night sweats, unintentional weight loss  HENT: Negative for ear pain and hearing loss.   Eyes: Negative for loss of vision and eye pain.  Respiratory: Negative for cough, sputum, wheezing.   Cardiovascular: See HPI. Gastrointestinal: Negative for abdominal pain, melena, and hematochezia.  Genitourinary: Negative for dysuria and hematuria.  Musculoskeletal: Negative for falls and myalgias.  Skin: Negative for itching and rash.  Neurological: Negative for focal weakness, focal sensory changes and loss of consciousness.  Endo/Heme/Allergies: Does not bruise/bleed easily.   EKGs/Labs/Other Studies Reviewed:    The following studies were reviewed today:  Echo 08/01/18  1. The left ventricle has normal systolic function with an ejection fraction of 60-65%. The cavity size was normal. There is mildly increased left ventricular wall thickness. Left ventricular diastolic Doppler parameters are indeterminate.  2. The right ventricle has normal systolic function.  3. Left atrial size was moderately dilated.  4. A 38mm an [employer] valve is present in the aortic position. Procedure Date: 01/03/18.  5. Aortic valve prosthesis appears to open well.  Echo 10/25/17 - Left ventricle: Wall thickness was increased in a pattern of mild   LVH. Systolic function was mildly to moderately reduced. The   estimated ejection fraction was in the range of 40% to 45%. - Aortic valve: Mildly to moderately calcified annulus. Mildly   thickened, moderately calcified leaflets. There was mild to   moderate regurgitation. Valve area (VTI): 0.93 cm^2. Valve area   (Vmax): 0.93 cm^2. Valve area (Vmean): 0.9 cm^2. - Left atrium: The atrium was mildly dilated. - Right atrium: The atrium was mildly dilated.  TEE 12/24/17 - Left ventricle: The  estimated ejection fraction was 55%. - Aortic valve: Trileaflet; moderately calcified leaflets; fusion   of the left-noncoronary commissure. There was moderate stenosis. - Mitral valve: No evidence of vegetation. There was mild to   moderate regurgitation. - Left atrium: The atrium was  dilated. No evidence of thrombus in   the atrial cavity or appendage. - Right atrium: The atrium was dilated. - Atrial septum: No defect or patent foramen ovale was identified. - Tricuspid valve: No evidence of vegetation. - Pulmonic valve: No evidence of vegetation.  [facility] 12/24/17  Ost RCA to Prox RCA lesion is 95% stenosed.  Prox LAD lesion is 95% stenosed.  Mid RCA lesion is 80% stenosed.  Dist RCA lesion is 99% stenosed.  Post Atrio lesion is 50% stenosed.  Prox Cx to Mid Cx lesion is 80% stenosed.  Mid LM to Dist LM lesion is 50% stenosed.  Ost 1st Diag lesion is 90% stenosed.  There is moderate aortic valve stenosis.   Findings:  RA = 6 RV = 34/5 PA = 34/9 (20) PCW = 21 Fick cardiac output/index = 5.5/2.7 PVR = < 1.0 WU Ao sat = 97% PA sat = 73%, 74%  Assessment: 1. Moderate aortic stenosis 2. Severe 3v CAD 3. Normal LV function 4. Relatively normal hemodynamics Plan/Discussion: Films reviewed with Dr. Prescott Gum. Plan AVR/CABG +/- Maze with Dr. Prescott Gum next week.   Surgery 01/03/18 Procedure (s):  1. Coronary artery bypass grafting x4 (left internal mammary artery to left anterior descending, saphenous vein graft to diagonal, saphenous vein graft to OM1, saphenous vein graft to posterior descending). 2. Aortic valve replacement with a 23 mm Edwards pericardial valve, serial [id number]. 3. Application of left atrial clip, AtriCure 35 mm. 4. Endoscopic harvest of right leg greater saphenous vein by Dr. Prescott Gum on 01/03/2018.  Also R CEA with bovine patch angioplasty  EKG:  EKG is personally reviewed.  The ekg ordered 08/10/18 demonstrates atrial fibrillation RVR  at 113 bpm  Recent Labs: 01/04/2018: Magnesium 2.0 09/16/2018: ALT 7; BUN 17; Creatinine 0.90; Hemoglobin 11.4; Platelet Count 649; Potassium 4.4; Sodium 136  Recent Lipid Panel    Component Value Date/Time   CHOL 127 02/17/2018 1014   TRIG 80 02/17/2018 1014   HDL 41 02/17/2018 1014   CHOLHDL 3.1 02/17/2018 1014   VLDL 16 02/17/2018 1014   LDLCALC 70 02/17/2018 1014    Physical Exam:    VS:  BP (!) 88/48 (BP Location: Left Arm)   Pulse 100   Ht 5\' 8"  (1.727 m)   Wt 197 lb 12.8 oz (89.7 kg)   SpO2 96%   BMI 30.08 kg/m     Wt Readings from Last 3 Encounters:  09/20/18 197 lb 12.8 oz (89.7 kg)  09/16/18 194 lb 6.4 oz (88.2 kg)  09/16/18 193 lb (87.5 kg)    GEN: Well nourished, well developed in no acute distress HEENT: Normal, moist mucous membranes NECK: No JVD CARDIAC: irregularly irregular rhythm, normal S1 and S2, no rubs, gallops. 2/6 SEM VASCULAR: Radial and DP pulses 2+ bilaterally. No carotid bruits RESPIRATORY:  Clear to auscultation without rales, wheezing or rhonchi  ABDOMEN: Soft, non-tender, non-distended MUSCULOSKELETAL:  Ambulates independently SKIN: Warm and dry, no edema NEUROLOGIC:  Alert and oriented x 3. No focal neuro deficits noted. PSYCHIATRIC:  Normal affect   ASSESSMENT:    1. Coronary artery disease due to lipid rich plaque   2. Permanent atrial fibrillation   3. S/P CABG x 4   4. S/P AVR (aortic valve replacement)   5. History of CEA (carotid endarterectomy)   6. Polycythemia vera ([place])   7. PAD (peripheral artery disease) ([facility])    PLAN:    S/P R CEA, CAD s/p CABG x4, AVR (for bicuspid AV  stenosis) 12/2017:  -continue metoprolol succinate 12.5 mg daily. Had been on higher dose, but BP (especially in lowest BP arm) running low. Would like to keep resting HR around 90 or less, continue to monitor -on 81 mg aspirin for valve and CAD -continue rosuvastatin 20 mg (no atorvastatin with interaction w/nefazodone). -continue jardiance given  his DM and CV risk -has endocarditis prophylaxis antibiotics -repeat echo with EF 60-65%, valve functioning well  Atrial fibrillation, permanent: goal of rate control -would like resting HR lower. Deconditioned, recovering from recent severe anemia due to GI ulcers. Given this, some level of tachycardia is expected. Want to avoid relative hypotension in extremities given his PAD. May be able to uptitrate metoprolol in the future but hold for now  -chadsvasc=3, s/p LA clip. His polycythemia also places him at higher risk of clot as well.  -Continue rate control with metoprolol, anticoagulation with apixaban  PAD: claudication symptoms improving, but significant flow differential on dopplers and on arm BP readings today. -quit smoking. On aspirin and statin. ABIs showed moderate disease on the right, severe disease on the left leg with diffusely reduced flow and arm differential pressures. Continue to follow  Hypertension: at goal, avoid hypotension give PAD pressure differences -continue metoprolol  Recent severe anemia requiring admission, 5 units PRBC, EGD with multiple gastric ulcers: slowly recovering, denies any melena/hematochezia. Remarkably low Hgb on presentation, especially given that he usually runs high with his polycythemia vera  Overall Mr. Cargle has had a very difficult period from his surgery in 12/2017 to his current appointment. He has perservered well but needs significant strengthening, as he is very deconditioned as compared to prior. I agree with Dr. Lucianne Lei Trigt's recommendation for cardiac rehab. If he gains significant improvement in his strength and conditioning level, we can reassess if he is ready to return to work. With his recent hospitalization and setbacks, he is absolutely not ready to return to work at this time.  We will reassess at his follow up in 3 mos.  TIME SPENT WITH PATIENT: 30 minutes of direct patient care. More than 50% of that time was spent on  coordination of care and counseling regarding recent hospitalization, med review, symptom review, recommendations for conditioning and cardiac rehab.  Buford Dresser, MD, PhD [place]  [facility]   Medication Adjustments/Labs and Tests Ordered: Current medicines are reviewed at length with the patient today.  Concerns regarding medicines are outlined above.  No orders of the defined types were placed in this encounter.  No orders of the defined types were placed in this encounter.   Patient Instructions  Medication Instructions:  Your Physician recommend you continue on your current medication as directed.    If you need a refill on your cardiac medications before your next appointment, please call your pharmacy.   Lab work: None  Testing/Procedures: None  Follow-Up: At [facility], you and your health needs are our priority.  As part of our continuing mission to provide you with exceptional heart care, we have created designated Provider Care Teams.  These Care Teams include your primary Cardiologist (physician) and Advanced Practice Providers (APPs -  Physician Assistants and Nurse Practitioners) who all work together to provide you with the care you need, when you need it. You will need a follow up appointment in 3 months.  Please call our office 2 months in advance to schedule this appointment.  You may see Buford Dresser, MD or one of the following Advanced Practice Providers on your  designated Care Team:   Rosaria Ferries, PA-C . Jory Sims, DNP, ANP       Signed, Buford Dresser, MD PhD 09/20/2018 10:29 PM    [place]

## 2018-09-21 ENCOUNTER — Other Ambulatory Visit: Payer: Self-pay

## 2018-09-21 ENCOUNTER — Encounter (HOSPITAL_COMMUNITY): Payer: BC Managed Care – PPO

## 2018-09-21 MED ORDER — PANTOPRAZOLE SODIUM 40 MG PO TBEC: 40.0000 mg | DELAYED_RELEASE_TABLET | Freq: Two times a day (BID) | ORAL | 0 refills | Status: DC

## 2018-09-21 MED ORDER — PANTOPRAZOLE SODIUM 40 MG PO TBEC: 40.0000 mg | DELAYED_RELEASE_TABLET | Freq: Every day | ORAL | 3 refills | Status: DC

## 2018-09-29 ENCOUNTER — Inpatient Hospital Stay: Payer: BC Managed Care – PPO | Attending: Hematology and Oncology | Admitting: Adult Health

## 2018-09-29 ENCOUNTER — Inpatient Hospital Stay: Payer: BC Managed Care – PPO

## 2018-09-29 ENCOUNTER — Other Ambulatory Visit: Payer: Self-pay

## 2018-09-29 VITALS — BP 142/68 | HR 73 | Temp 97.8°F | Resp 18

## 2018-09-29 DIAGNOSIS — Z7901 Long term (current) use of anticoagulants: Secondary | ICD-10-CM | POA: Insufficient documentation

## 2018-09-29 DIAGNOSIS — D45 Polycythemia vera: Secondary | ICD-10-CM | POA: Diagnosis not present

## 2018-09-29 DIAGNOSIS — Z23 Encounter for immunization: Secondary | ICD-10-CM | POA: Insufficient documentation

## 2018-09-29 DIAGNOSIS — D649 Anemia, unspecified: Secondary | ICD-10-CM | POA: Insufficient documentation

## 2018-09-29 DIAGNOSIS — D72829 Elevated white blood cell count, unspecified: Secondary | ICD-10-CM | POA: Diagnosis not present

## 2018-09-29 DIAGNOSIS — Z7982 Long term (current) use of aspirin: Secondary | ICD-10-CM | POA: Diagnosis not present

## 2018-09-29 DIAGNOSIS — D751 Secondary polycythemia: Secondary | ICD-10-CM | POA: Diagnosis not present

## 2018-09-29 DIAGNOSIS — Z79899 Other long term (current) drug therapy: Secondary | ICD-10-CM | POA: Diagnosis not present

## 2018-09-29 DIAGNOSIS — D75839 Thrombocytosis, unspecified: Secondary | ICD-10-CM

## 2018-09-29 DIAGNOSIS — D473 Essential (hemorrhagic) thrombocythemia: Secondary | ICD-10-CM

## 2018-09-29 LAB — CBC WITH DIFFERENTIAL (CANCER CENTER ONLY)
Abs Immature Granulocytes: 2.06 10*3/uL — ABNORMAL HIGH (ref 0.00–0.07)
Basophils Absolute: 0.3 10*3/uL — ABNORMAL HIGH (ref 0.0–0.1)
Basophils Relative: 1 %
Eosinophils Absolute: 0.2 10*3/uL (ref 0.0–0.5)
Eosinophils Relative: 1 %
HCT: 35.7 % — ABNORMAL LOW (ref 39.0–52.0)
Hemoglobin: 10.6 g/dL — ABNORMAL LOW (ref 13.0–17.0)
Immature Granulocytes: 10 %
Lymphocytes Relative: 9 %
Lymphs Abs: 1.9 10*3/uL (ref 0.7–4.0)
MCH: 26.6 pg (ref 26.0–34.0)
MCHC: 29.7 g/dL — ABNORMAL LOW (ref 30.0–36.0)
MCV: 89.7 fL (ref 80.0–100.0)
Monocytes Absolute: 1.4 10*3/uL — ABNORMAL HIGH (ref 0.1–1.0)
Monocytes Relative: 7 %
Neutro Abs: 15.1 10*3/uL — ABNORMAL HIGH (ref 1.7–7.7)
Neutrophils Relative %: 72 %
Platelet Count: 562 10*3/uL — ABNORMAL HIGH (ref 150–400)
RBC: 3.98 MIL/uL — ABNORMAL LOW (ref 4.22–5.81)
RDW: 21.8 % — ABNORMAL HIGH (ref 11.5–15.5)
WBC Count: 20.9 10*3/uL — ABNORMAL HIGH (ref 4.0–10.5)
nRBC: 0.2 % (ref 0.0–0.2)

## 2018-09-29 LAB — CMP (CANCER CENTER ONLY)
ALT: <6 U/L (ref 0–44)
AST: 12 U/L — ABNORMAL LOW (ref 15–41)
Albumin: 3.1 g/dL — ABNORMAL LOW (ref 3.5–5.0)
Alkaline Phosphatase: 99 U/L (ref 38–126)
Anion gap: 10 (ref 5–15)
BUN: 8 mg/dL (ref 8–23)
CO2: 24 mmol/L (ref 22–32)
Calcium: 8.9 mg/dL (ref 8.9–10.3)
Chloride: 104 mmol/L (ref 98–111)
Creatinine: 0.87 mg/dL (ref 0.61–1.24)
GFR, Est AFR Am: >60 mL/min (ref >60)
GFR, Estimated: >60 mL/min (ref >60)
Glucose, Bld: 111 mg/dL — ABNORMAL HIGH (ref 70–99)
Potassium: 3.8 mmol/L (ref 3.5–5.1)
Sodium: 138 mmol/L (ref 135–145)
Total Bilirubin: 0.4 mg/dL (ref 0.3–1.2)
Total Protein: 7.1 g/dL (ref 6.5–8.1)

## 2018-09-29 LAB — LACTATE DEHYDROGENASE: LDH: 365 U/L — ABNORMAL HIGH (ref 98–192)

## 2018-09-29 MED ORDER — LIDOCAINE HCL 2 % IJ SOLN
INTRAMUSCULAR | Status: AC
  Filled 2018-09-29: qty 20

## 2018-09-29 NOTE — Progress Notes (Signed)
INDICATION: MPN, polycythemia   Bone Marrow Biopsy and Aspiration Procedure Note   Informed consent was obtained and potential risks including bleeding, infection and pain were reviewed with the patient.  The patient's name, date of birth, identification, consent and allergies were verified prior to the start of procedure and time out was performed.  The left posterior iliac crest was chosen as the site of biopsy.  The skin was prepped with ChloraPrep.   8 cc of 2% lidocaine was used to provide local anaesthesia.   10 cc of bone marrow aspirate was obtained followed by 1cm biopsy.  Pressure was applied to the biopsy site and bandage was placed over the biopsy site. Patient was made to lie on the back for 15 mins prior to discharge.  The procedure was tolerated well. COMPLICATIONS: None BLOOD LOSS: none The patient was discharged home in stable condition with a 1 week follow up to review results.  Patient was provided with post bone marrow biopsy instructions and instructed to call if there was any bleeding or worsening pain.  Specimens sent for flow cytometry, cytogenetics and additional studies.  Signed  C , NP   

## 2018-10-04 ENCOUNTER — Other Ambulatory Visit: Payer: Self-pay

## 2018-10-04 ENCOUNTER — Inpatient Hospital Stay (HOSPITAL_BASED_OUTPATIENT_CLINIC_OR_DEPARTMENT_OTHER): Payer: BC Managed Care – PPO | Admitting: Hematology

## 2018-10-04 ENCOUNTER — Encounter: Payer: Self-pay | Admitting: Hematology

## 2018-10-04 ENCOUNTER — Inpatient Hospital Stay: Payer: BC Managed Care – PPO

## 2018-10-04 VITALS — BP 120/65 | HR 91 | Temp 97.9°F | Resp 19 | Wt 197.0 lb

## 2018-10-04 DIAGNOSIS — Z79899 Other long term (current) drug therapy: Secondary | ICD-10-CM | POA: Diagnosis not present

## 2018-10-04 DIAGNOSIS — D45 Polycythemia vera: Secondary | ICD-10-CM

## 2018-10-04 DIAGNOSIS — D75839 Thrombocytosis, unspecified: Secondary | ICD-10-CM

## 2018-10-04 DIAGNOSIS — Z23 Encounter for immunization: Secondary | ICD-10-CM

## 2018-10-04 DIAGNOSIS — D751 Secondary polycythemia: Secondary | ICD-10-CM | POA: Diagnosis not present

## 2018-10-04 DIAGNOSIS — Z7901 Long term (current) use of anticoagulants: Secondary | ICD-10-CM | POA: Diagnosis not present

## 2018-10-04 DIAGNOSIS — Z7982 Long term (current) use of aspirin: Secondary | ICD-10-CM | POA: Diagnosis not present

## 2018-10-04 DIAGNOSIS — D72829 Elevated white blood cell count, unspecified: Secondary | ICD-10-CM | POA: Diagnosis not present

## 2018-10-04 DIAGNOSIS — D473 Essential (hemorrhagic) thrombocythemia: Secondary | ICD-10-CM

## 2018-10-04 DIAGNOSIS — D649 Anemia, unspecified: Secondary | ICD-10-CM | POA: Diagnosis not present

## 2018-10-04 LAB — CBC WITH DIFFERENTIAL (CANCER CENTER ONLY)
Abs Immature Granulocytes: 4.86 10*3/uL — ABNORMAL HIGH (ref 0.00–0.07)
Basophils Absolute: 0.4 10*3/uL — ABNORMAL HIGH (ref 0.0–0.1)
Basophils Relative: 1 %
Eosinophils Absolute: 0.3 10*3/uL (ref 0.0–0.5)
Eosinophils Relative: 1 %
HCT: 35.2 % — ABNORMAL LOW (ref 39.0–52.0)
Hemoglobin: 10.7 g/dL — ABNORMAL LOW (ref 13.0–17.0)
Immature Granulocytes: 15 %
Lymphocytes Relative: 7 %
Lymphs Abs: 2.5 10*3/uL (ref 0.7–4.0)
MCH: 26.6 pg (ref 26.0–34.0)
MCHC: 30.4 g/dL (ref 30.0–36.0)
MCV: 87.6 fL (ref 80.0–100.0)
Monocytes Absolute: 2.5 10*3/uL — ABNORMAL HIGH (ref 0.1–1.0)
Monocytes Relative: 8 %
Neutro Abs: 22.6 10*3/uL — ABNORMAL HIGH (ref 1.7–7.7)
Neutrophils Relative %: 68 %
Platelet Count: 656 10*3/uL — ABNORMAL HIGH (ref 150–400)
RBC: 4.02 MIL/uL — ABNORMAL LOW (ref 4.22–5.81)
RDW: 22.4 % — ABNORMAL HIGH (ref 11.5–15.5)
WBC Count: 33.1 10*3/uL — ABNORMAL HIGH (ref 4.0–10.5)
nRBC: 0.4 % — ABNORMAL HIGH (ref 0.0–0.2)

## 2018-10-04 LAB — CMP (CANCER CENTER ONLY)
ALT: 6 U/L (ref 0–44)
AST: 12 U/L — ABNORMAL LOW (ref 15–41)
Albumin: 3.8 g/dL (ref 3.5–5.0)
Alkaline Phosphatase: 95 U/L (ref 38–126)
Anion gap: 9 (ref 5–15)
BUN: 10 mg/dL (ref 8–23)
CO2: 26 mmol/L (ref 22–32)
Calcium: 9.7 mg/dL (ref 8.9–10.3)
Chloride: 99 mmol/L (ref 98–111)
Creatinine: 0.85 mg/dL (ref 0.61–1.24)
GFR, Est AFR Am: >60 mL/min (ref >60)
GFR, Estimated: >60 mL/min (ref >60)
Glucose, Bld: 97 mg/dL (ref 70–99)
Potassium: 4.5 mmol/L (ref 3.5–5.1)
Sodium: 134 mmol/L — ABNORMAL LOW (ref 135–145)
Total Bilirubin: 0.6 mg/dL (ref 0.3–1.2)
Total Protein: 7.5 g/dL (ref 6.5–8.1)

## 2018-10-04 MED ORDER — INFLUENZA VAC SPLIT QUAD 0.5 ML IM SUSY
PREFILLED_SYRINGE | INTRAMUSCULAR | Status: AC
  Filled 2018-10-04: qty 0.5

## 2018-10-04 MED ORDER — INFLUENZA VAC SPLIT QUAD 0.5 ML IM SUSY
0.5000 mL | PREFILLED_SYRINGE | Freq: Once | INTRAMUSCULAR | Status: AC
  Administered 2018-10-04: 0.5 mL via INTRAMUSCULAR

## 2018-10-04 NOTE — Progress Notes (Signed)
[facility]in [name]  Patient Care Team: [name], MD as PCP - General (Family Medicine) Buford Dresser, MD as PCP - Cardiology (Cardiology)  HEME/ONC OVERVIEW: 1. Essential thrombocythemia, JAK2 V617 mutation+; high risk (age >50) -Previous patient of Dr. Audelia Hives  -11/2017: Hgb ~17; WBC ~30k w/ nl diff, plts ~500k; NGS showed JAK2 V617F mutation (83% allele frequency), CAL-R mutation (4% allele frequency); BCR/ALB negative; no bone marrow biopsy done at diagnosis  -11/2017 - present: Hydrea, see dosing below; phlebotomy x 1 in 12/2017 -09/2018: bone marrow bx showed hypercellular marrow with myeloid hyperplasia and increased atypical megakaryocytes, increased reticulin fibers (MF 1 of 3); no increase in blast population   TREATMENT REGIMEN:  12/03/2017 - present: Hydrea, currently 528m BID  PERTINENT NON-HEM/ONC PROBLEMS: 1. CAD s/p CABG x 4 in 12/2017 2. Moderate to severe aortic stenosis s/p aortic valve replacement and left atrial clip in 12/2017   ASSESSMENT & PLAN:   Essential thrombocythemia, JAK2+; high risk  -Previous patient of Dr. RAudelia Hives-Bone marrow bx was not done at the initial diagnosis; I reviewed the patient's labs extensively and due to the lab findings not entirely consistent with PV, bone marrow biopsy was done in 09/2018, which showed hypercellular marrow with myeloid hyperplasia and increased atypical megakaryocytes and increased reticulin fibers (MF 1 of 3), but there was no increase in blast population. -I discussed the bone marrow biopsies in detail with the patient -Given the increased megakaryocytic population in the bone marrow and the initial thrombocytosis on presentation, this is more consistent with essential thrombocythemia instead of polycythemia vera.  Furthermore, there is low-grade reticulin fibers on the bone marrow biopsy, suggesting early myelofibrosis -I discussed the case in detail with pathology, and have  requested FISH panel for PMF as well as cytogenetics on the bone marrow aspirate -Plts 656k today, rising since the last visit -I have instructed the patient to resume Hydrea 5030mBID  -On ASA 813maily   Leukocytosis -Most likely secondary to ET  -Patient recently completed a prolonged steroid taper  -WBC 33k today, higher than the last visit  -Resume Hydrea as noted above  Normocytic anemia -Likely multifactorial, including iron deficiency from recent GI blood loss and medication side effect  -Hgb 10.7 today, persistently low -We discussed some of the risks, benefits, and alternatives of intravenous iron infusions.  -The patient is persistently anemic and the iron level is low; as such, oral supplement is not sufficient to replete iron storage quickly and he will need IV iron to higher levels of iron faster for adequate hematopoesis.  -Some of the side-effects to be expected including risks of infusion reactions, phlebitis, headaches, nausea and fatigue.   -The patient is willing to proceed, tentatively 1st dose on 10/07/2018. Plan for 2 doses.  -Goal is to keep ferritin level greater than 50.  Orders Placed This Encounter  Procedures  . CBC with Differential (Cancer Center Only)    Standing Status:   Future    Standing Expiration Date:   11/08/2019  . CMP ([facility])    Standing Status:   Future    Standing Expiration Date:   11/08/2019  . Ferritin    Standing Status:   Future    Standing Expiration Date:   11/08/2019  . Iron and TIBC    Standing Status:   Future    Standing Expiration Date:   11/08/2019  . Lactate dehydrogenase    Standing Status:   Future  Standing Expiration Date:   11/08/2019   All questions were answered. The patient knows to call the clinic with any problems, questions or concerns. No barriers to learning was detected.  Return in 1 month for labs and clinic follow-up.  Tish Men, MD 10/04/2018 4:15 PM  CHIEF COMPLAINT: "I am  fine"  INTERVAL HISTORY: Justin [name] returns to clinic for follow-up of history of PVD and recent bone marrowbiopsy results.  Patient was having tolerated the bone marrow biopsy well, and did not have any abnormal bleeding after the procedure.  He has not been on Hydrea.  He otherwise feels well, and denies any constitutional symptoms.  He has an upcoming travel plan to go [place] for a motorcycle event.    REVIEW OF SYSTEMS:   Constitutional: ( - ) fevers, ( - )  chills , ( - ) night sweats Eyes: ( - ) blurriness of vision, ( - ) double vision, ( - ) watery eyes Ears, nose, mouth, throat, and face: ( - ) mucositis, ( - ) sore throat Respiratory: ( - ) cough, ( - ) dyspnea, ( - ) wheezes Cardiovascular: ( - ) palpitation, ( - ) chest discomfort, ( - ) lower extremity swelling Gastrointestinal:  ( - ) nausea, ( - ) heartburn, ( - ) change in bowel habits Skin: ( + ) abnormal skin rashes Lymphatics: ( - ) new lymphadenopathy, ( - ) easy bruising Neurological: ( - ) numbness, ( - ) tingling, ( - ) new weaknesses Behavioral/Psych: ( - ) mood change, ( - ) new changes  All other systems were reviewed with the patient and are negative.  I have reviewed the past medical history, past surgical history, social history and family history with the patient and they are unchanged from previous [name].  ALLERGIES:  is allergic to codeine.  MEDICATIONS:  Current Outpatient Medications  Medication Sig Dispense Refill  . amoxicillin (AMOXIL) 500 MG capsule Take 1 capsule (500 mg total) by mouth once as needed for up to 4 doses. Take four 500 mg capsules by mouth 1 hour before dental visits 4 capsule 12  . apixaban (ELIQUIS) 5 MG TABS tablet Take 1 tablet (5 mg total) by mouth 2 (two) times daily. 60 tablet   . aspirin 81 MG tablet Take 81 mg by mouth daily.     . Cholecalciferol 125 MCG (5000 UT) capsule Take 1 capsule by mouth daily.     . cyclobenzaprine (FLEXERIL) 10 MG tablet Take 10 mg by mouth 3  (three) times daily.     . empagliflozin (JARDIANCE) 10 MG TABS tablet Take 10 mg by mouth daily. 30 tablet 11  . ferrous sulfate 325 (65 FE) MG tablet Take 1 tablet (325 mg total) by mouth 2 (two) times daily with a meal. 60 tablet 0  . fluticasone (FLONASE) 50 MCG/ACT nasal spray Place 2 sprays into both nostrils daily.    . furosemide (LASIX) 20 MG tablet Take 20 mg by mouth daily as needed for fluid.     Marland Kitchen loratadine (CLARITIN) 10 MG tablet Take 10 mg by mouth daily.    . metFORMIN (GLUCOPHAGE-XR) 500 MG 24 hr tablet Take 500 mg by mouth daily before supper.    . metoprolol succinate (TOPROL XL) 25 MG 24 hr tablet Take 0.5 tablets (12.5 mg total) by mouth daily. 30 tablet 0  . nefazodone (SERZONE) 100 MG tablet Take 100 mg by mouth 2 (two) times daily.    Derrill Memo ON  11/21/2018] pantoprazole (PROTONIX) 40 MG tablet Take 1 tablet (40 mg total) by mouth daily. 30 tablet 3  . pantoprazole (PROTONIX) 40 MG tablet Take 1 tablet (40 mg total) by mouth 2 (two) times daily. Take twice daily for 2 months then decrease to once daily. 122 tablet 0  . POTASSIUM CHLORIDE PO Take 1 tablet by mouth daily as needed (when taking lasix).     . pregabalin (LYRICA) 50 MG capsule Take 50 mg by mouth 3 (three) times daily.    . rosuvastatin (CRESTOR) 20 MG tablet Take 1 tablet (20 mg total) by mouth daily. 90 tablet 3  . zolpidem (AMBIEN) 10 MG tablet Take 10 mg by mouth at bedtime.      No current facility-administered medications for this visit.     PHYSICAL EXAMINATION: ECOG PERFORMANCE STATUS: 1 - Symptomatic but completely ambulatory  Today's Vitals   10/04/18 1502  BP: 120/65  Pulse: 91  Resp: 19  Temp: 97.9 F (36.6 C)  TempSrc: Temporal  SpO2: 98%  Weight: 197 lb (89.4 kg)   Body mass index is 29.95 kg/m.  Filed Weights   10/04/18 1502  Weight: 197 lb (89.4 kg)    GENERAL: alert, no distress and comfortable SKIN: chronic sun-damaged skin EYES: conjunctiva are pink and non-injected,  sclera clear OROPHARYNX: no exudate, no erythema; lips, buccal mucosa, and tongue normal  NECK: supple, non-tender LUNGS: clear to auscultation with normal breathing effort HEART: regular rate & rhythm and no murmurs and no lower extremity edema ABDOMEN: soft, non-tender, non-distended, normal bowel sounds Musculoskeletal: no cyanosis of digits and no clubbing  PSYCH: alert & oriented x 3, fluent speech NEURO: no focal motor/sensory deficits   LABORATORY DATA:  I have reviewed the data as listed    Component Value Date/Time   NA 134 (L) 10/04/2018 1425   K 4.5 10/04/2018 1425   CL 99 10/04/2018 1425   CO2 26 10/04/2018 1425   GLUCOSE 97 10/04/2018 1425   BUN 10 10/04/2018 1425   CREATININE 0.85 10/04/2018 1425   CALCIUM 9.7 10/04/2018 1425   PROT 7.5 10/04/2018 1425   ALBUMIN 3.8 10/04/2018 1425   AST 12 (L) 10/04/2018 1425   ALT 6 10/04/2018 1425   ALKPHOS 95 10/04/2018 1425   BILITOT 0.6 10/04/2018 1425   GFRNONAA >60 10/04/2018 1425   GFRAA >60 10/04/2018 1425    No results found for: SPEP, UPEP  Lab Results  Component Value Date   WBC 33.1 (H) 10/04/2018   NEUTROABS 22.6 (H) 10/04/2018   HGB 10.7 (L) 10/04/2018   HCT 35.2 (L) 10/04/2018   MCV 87.6 10/04/2018   PLT 656 (H) 10/04/2018      Chemistry      Component Value Date/Time   NA 134 (L) 10/04/2018 1425   K 4.5 10/04/2018 1425   CL 99 10/04/2018 1425   CO2 26 10/04/2018 1425   BUN 10 10/04/2018 1425   CREATININE 0.85 10/04/2018 1425      Component Value Date/Time   CALCIUM 9.7 10/04/2018 1425   ALKPHOS 95 10/04/2018 1425   AST 12 (L) 10/04/2018 1425   ALT 6 10/04/2018 1425   BILITOT 0.6 10/04/2018 1425       RADIOGRAPHIC STUDIES: I have personally reviewed the radiological images as listed below and agreed with the findings in the report. No results found.

## 2018-10-05 DIAGNOSIS — E119 Type 2 diabetes mellitus without complications: Secondary | ICD-10-CM | POA: Diagnosis not present

## 2018-10-05 DIAGNOSIS — E782 Mixed hyperlipidemia: Secondary | ICD-10-CM | POA: Diagnosis not present

## 2018-10-05 DIAGNOSIS — G4700 Insomnia, unspecified: Secondary | ICD-10-CM | POA: Diagnosis not present

## 2018-10-05 DIAGNOSIS — I10 Essential (primary) hypertension: Secondary | ICD-10-CM | POA: Diagnosis not present

## 2018-10-05 LAB — LACTATE DEHYDROGENASE: LDH: 490 U/L — ABNORMAL HIGH (ref 98–192)

## 2018-10-06 ENCOUNTER — Encounter (HOSPITAL_COMMUNITY): Payer: Self-pay | Admitting: Hematology

## 2018-10-07 ENCOUNTER — Other Ambulatory Visit: Payer: Self-pay

## 2018-10-07 ENCOUNTER — Inpatient Hospital Stay: Payer: BC Managed Care – PPO

## 2018-10-07 VITALS — BP 114/50 | HR 72 | Temp 97.8°F | Resp 18

## 2018-10-07 DIAGNOSIS — D72829 Elevated white blood cell count, unspecified: Secondary | ICD-10-CM | POA: Diagnosis not present

## 2018-10-07 DIAGNOSIS — Z7901 Long term (current) use of anticoagulants: Secondary | ICD-10-CM | POA: Diagnosis not present

## 2018-10-07 DIAGNOSIS — Z7982 Long term (current) use of aspirin: Secondary | ICD-10-CM | POA: Diagnosis not present

## 2018-10-07 DIAGNOSIS — D649 Anemia, unspecified: Secondary | ICD-10-CM

## 2018-10-07 DIAGNOSIS — D751 Secondary polycythemia: Secondary | ICD-10-CM | POA: Diagnosis not present

## 2018-10-07 DIAGNOSIS — Z79899 Other long term (current) drug therapy: Secondary | ICD-10-CM | POA: Diagnosis not present

## 2018-10-07 DIAGNOSIS — Z23 Encounter for immunization: Secondary | ICD-10-CM | POA: Diagnosis not present

## 2018-10-07 MED ORDER — SODIUM CHLORIDE 0.9 % IV SOLN
Freq: Once | INTRAVENOUS | Status: AC
  Administered 2018-10-07: 13:00:00 via INTRAVENOUS
  Filled 2018-10-07: qty 250

## 2018-10-07 MED ORDER — SODIUM CHLORIDE 0.9 % IV SOLN
510.0000 mg | Freq: Once | INTRAVENOUS | Status: AC
  Administered 2018-10-07: 510 mg via INTRAVENOUS
  Filled 2018-10-07: qty 510

## 2018-10-07 NOTE — Patient Instructions (Signed)

## 2018-10-14 ENCOUNTER — Other Ambulatory Visit: Payer: Self-pay

## 2018-10-14 ENCOUNTER — Inpatient Hospital Stay: Payer: BC Managed Care – PPO

## 2018-10-14 VITALS — BP 132/60 | HR 84 | Temp 97.5°F | Resp 18

## 2018-10-14 DIAGNOSIS — D751 Secondary polycythemia: Secondary | ICD-10-CM | POA: Diagnosis not present

## 2018-10-14 DIAGNOSIS — D649 Anemia, unspecified: Secondary | ICD-10-CM | POA: Diagnosis not present

## 2018-10-14 DIAGNOSIS — Z7901 Long term (current) use of anticoagulants: Secondary | ICD-10-CM | POA: Diagnosis not present

## 2018-10-14 DIAGNOSIS — Z23 Encounter for immunization: Secondary | ICD-10-CM | POA: Diagnosis not present

## 2018-10-14 DIAGNOSIS — D72829 Elevated white blood cell count, unspecified: Secondary | ICD-10-CM | POA: Diagnosis not present

## 2018-10-14 DIAGNOSIS — Z79899 Other long term (current) drug therapy: Secondary | ICD-10-CM | POA: Diagnosis not present

## 2018-10-14 DIAGNOSIS — Z7982 Long term (current) use of aspirin: Secondary | ICD-10-CM | POA: Diagnosis not present

## 2018-10-14 MED ORDER — SODIUM CHLORIDE 0.9 % IV SOLN
510.0000 mg | Freq: Once | INTRAVENOUS | Status: AC
  Administered 2018-10-14: 13:00:00 510 mg via INTRAVENOUS
  Filled 2018-10-14: qty 510

## 2018-10-14 MED ORDER — SODIUM CHLORIDE 0.9 % IV SOLN
Freq: Once | INTRAVENOUS | Status: AC
  Administered 2018-10-14: 13:00:00 via INTRAVENOUS
  Filled 2018-10-14: qty 250

## 2018-10-14 NOTE — Patient Instructions (Signed)

## 2018-10-25 ENCOUNTER — Encounter (HOSPITAL_COMMUNITY): Payer: Self-pay | Admitting: Hematology

## 2018-11-01 ENCOUNTER — Other Ambulatory Visit: Payer: Self-pay | Admitting: Cardiology

## 2018-11-03 ENCOUNTER — Telehealth: Payer: Self-pay | Admitting: Hematology

## 2018-11-03 ENCOUNTER — Encounter: Payer: Self-pay | Admitting: Hematology

## 2018-11-03 ENCOUNTER — Inpatient Hospital Stay (HOSPITAL_BASED_OUTPATIENT_CLINIC_OR_DEPARTMENT_OTHER): Payer: BC Managed Care – PPO | Admitting: Hematology

## 2018-11-03 ENCOUNTER — Inpatient Hospital Stay: Payer: BC Managed Care – PPO | Attending: Hematology and Oncology

## 2018-11-03 ENCOUNTER — Other Ambulatory Visit: Payer: Self-pay

## 2018-11-03 VITALS — BP 140/57 | HR 84 | Temp 97.1°F | Resp 16 | Ht 68.0 in | Wt 196.8 lb

## 2018-11-03 DIAGNOSIS — D649 Anemia, unspecified: Secondary | ICD-10-CM | POA: Insufficient documentation

## 2018-11-03 DIAGNOSIS — D473 Essential (hemorrhagic) thrombocythemia: Secondary | ICD-10-CM | POA: Diagnosis not present

## 2018-11-03 DIAGNOSIS — Z7982 Long term (current) use of aspirin: Secondary | ICD-10-CM | POA: Insufficient documentation

## 2018-11-03 DIAGNOSIS — D72829 Elevated white blood cell count, unspecified: Secondary | ICD-10-CM | POA: Diagnosis not present

## 2018-11-03 LAB — CMP (CANCER CENTER ONLY)
ALT: 11 U/L (ref 0–44)
AST: 23 U/L (ref 15–41)
Albumin: 4.3 g/dL (ref 3.5–5.0)
Alkaline Phosphatase: 93 U/L (ref 38–126)
Anion gap: 9 (ref 5–15)
BUN: 12 mg/dL (ref 8–23)
CO2: 31 mmol/L (ref 22–32)
Calcium: 10.3 mg/dL (ref 8.9–10.3)
Chloride: 101 mmol/L (ref 98–111)
Creatinine: 0.83 mg/dL (ref 0.61–1.24)
GFR, Est AFR Am: >60 mL/min (ref >60)
GFR, Estimated: >60 mL/min (ref >60)
Glucose, Bld: 111 mg/dL — ABNORMAL HIGH (ref 70–99)
Potassium: 4.6 mmol/L (ref 3.5–5.1)
Sodium: 141 mmol/L (ref 135–145)
Total Bilirubin: 0.6 mg/dL (ref 0.3–1.2)
Total Protein: 7.8 g/dL (ref 6.5–8.1)

## 2018-11-03 LAB — CBC WITH DIFFERENTIAL (CANCER CENTER ONLY)
Abs Immature Granulocytes: 1.82 10*3/uL — ABNORMAL HIGH (ref 0.00–0.07)
Basophils Absolute: 0.4 10*3/uL — ABNORMAL HIGH (ref 0.0–0.1)
Basophils Relative: 2 %
Eosinophils Absolute: 0.1 10*3/uL (ref 0.0–0.5)
Eosinophils Relative: 1 %
HCT: 45.5 % (ref 39.0–52.0)
Hemoglobin: 14.1 g/dL (ref 13.0–17.0)
Immature Granulocytes: 9 %
Lymphocytes Relative: 9 %
Lymphs Abs: 1.9 10*3/uL (ref 0.7–4.0)
MCH: 29.7 pg (ref 26.0–34.0)
MCHC: 31 g/dL (ref 30.0–36.0)
MCV: 96 fL (ref 80.0–100.0)
Monocytes Absolute: 1.1 10*3/uL — ABNORMAL HIGH (ref 0.1–1.0)
Monocytes Relative: 5 %
Neutro Abs: 15.4 10*3/uL — ABNORMAL HIGH (ref 1.7–7.7)
Neutrophils Relative %: 74 %
Platelet Count: 339 10*3/uL (ref 150–400)
RBC: 4.74 MIL/uL (ref 4.22–5.81)
WBC Count: 20.6 10*3/uL — ABNORMAL HIGH (ref 4.0–10.5)
nRBC: 0.3 % — ABNORMAL HIGH (ref 0.0–0.2)

## 2018-11-03 LAB — FERRITIN: Ferritin: 121 ng/mL (ref 24–336)

## 2018-11-03 LAB — IRON AND TIBC
Iron: 31 ug/dL — ABNORMAL LOW (ref 42–163)
Saturation Ratios: 11 % — ABNORMAL LOW (ref 20–55)
TIBC: 284 ug/dL (ref 202–409)
UIBC: 253 ug/dL (ref 117–376)

## 2018-11-03 LAB — LACTATE DEHYDROGENASE: LDH: 385 U/L — ABNORMAL HIGH (ref 98–192)

## 2018-11-03 NOTE — Telephone Encounter (Signed)
Appointments scheduled calendar printed per 10/15 los °

## 2018-11-03 NOTE — Progress Notes (Signed)
[name] OFFICE PROGRESS NOTE  Patient Care Team: [name], MD as PCP - General (Family Medicine) Buford Dresser, MD as PCP - Cardiology (Cardiology)  HEME/ONC OVERVIEW: 1. Essential thrombocythemia, JAK2 V617 mutation+; high risk (age >1) -Previous patient of Dr. 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 CAD s/p CABG x 4 in 12/2017 2. Moderate to severe aortic stenosis s/p aortic valve replacement and left atrial clip in 12/2017   ASSESSMENT & PLAN:   Essential thrombocythemia, JAK2+; high risk  -Previous patient of Dr. RAudelia Hives-I discussed the bone marrow biopsy results in detail with Dr. BMelina Copaof pathology -The reticulin fibers were only modestly increased and not suggestive of MF.  While PMF cannot be ruled out, the increased megakaryocytes and myeloid hyperplasia favored ET.  -Currently on Hydrea 5045mBID, resumed since 10/04/2018  -Plts 339k today, improving -Continue Hydrea 50060mID for now; monthly CBC to monitor for plt count -On ASA 72m26mily   Leukocytosis -Most likely secondary to ET  -WBC 20.6k today, improving -Continue Hydrea as above   Normocytic anemia -Likely multifactorial, including iron deficiency  from recent GI blood loss and medication side effect  -S/p 2 doses of Ferahme -Hgb 14.1 today, significantly improving -Clinically, patient denies any symptoms of recurrent GI bleeding -We will monitor it for now -I also encouraged the patient to follow up with his gastroenterologist for any symptoms of recurrent GI bleeding as needed   No orders of the defined types were placed in this encounter.  All questions were answered. The patient knows to call the clinic with any problems, questions or concerns. No barriers to learning was detected.  Return in 1 month for labs and clinic follow-up.  Justin [name] 11/03/2018 10:36 AM  CHIEF COMPLAINT: "I am feeling better"  INTERVAL HISTORY: Justin [name] to clinic for follow-up of essential thrombocythemia on Hydrea.  Patient tolerated IV iron well without any significant side effects in 09/2018.  He feels that his energy level has improved significantly since IV iron infusion.  Since resuming Hydrea, he has been tolerating that well and denies any abnormal side effects.  He feels well today, and denies any complaint.  REVIEW OF SYSTEMS:   Constitutional: ( - ) fevers, ( - )  chills , ( - ) night sweats Eyes: ( - ) blurriness of vision, ( - ) double vision, ( - ) watery eyes Ears, nose, mouth, throat, and face: ( - ) mucositis, ( - ) sore throat Respiratory: ( - ) cough, ( - ) dyspnea, ( - ) wheezes Cardiovascular: ( - ) palpitation, ( - ) chest discomfort, ( - ) lower extremity swelling Gastrointestinal:  ( - ) nausea, ( - ) heartburn, ( - ) change in bowel habits Skin: ( - ) abnormal skin rashes Lymphatics: ( - )  new lymphadenopathy, ( - ) easy bruising Neurological: ( - ) numbness, ( - ) tingling, ( - ) new weaknesses Behavioral/Psych: ( - ) mood change, ( - ) new changes  All other systems were reviewed with the patient and are negative.  SUMMARY OF ONCOLOGIC HISTORY: Oncology History   No history exists.    I have reviewed  the past medical history, past surgical history, social history and family history with the patient and they are unchanged from previous note.  ALLERGIES:  is allergic to codeine.  MEDICATIONS:  Current Outpatient Medications  Medication Sig Dispense Refill  . amoxicillin (AMOXIL) 500 MG capsule Take 1 capsule (500 mg total) by mouth once as needed for up to 4 doses. Take four 500 mg capsules by mouth 1 hour before dental visits 4 capsule 12  . apixaban (ELIQUIS) 5 MG TABS tablet Take 1 tablet (5 mg total) by mouth 2 (two) times daily. 60 tablet   . aspirin 81 MG tablet Take 81 mg by mouth daily.     . chlorhexidine (PERIDEX) 0.12 % solution     . Cholecalciferol 125 MCG (5000 UT) capsule Take 1 capsule by mouth daily.     . cyclobenzaprine (FLEXERIL) 10 MG tablet Take 10 mg by mouth 3 (three) times daily.     . empagliflozin (JARDIANCE) 10 MG TABS tablet Take 10 mg by mouth daily. 30 tablet 11  . ferrous sulfate 325 (65 FE) MG tablet Take 1 tablet (325 mg total) by mouth 2 (two) times daily with a meal. 60 tablet 0  . fluticasone (FLONASE) 50 MCG/ACT nasal spray Place 2 sprays into both nostrils daily.    . furosemide (LASIX) 20 MG tablet Take 20 mg by mouth daily as needed for fluid.     Marland Kitchen HYDROcodone-acetaminophen (NORCO/VICODIN) 5-325 MG tablet     . loratadine (CLARITIN) 10 MG tablet Take 10 mg by mouth daily.    . meloxicam (MOBIC) 15 MG tablet Take 15 mg by mouth daily.    . metFORMIN (GLUCOPHAGE-XR) 500 MG 24 hr tablet Take 500 mg by mouth daily before supper.    . metoprolol succinate (TOPROL XL) 25 MG 24 hr tablet Take 0.5 tablets (12.5 mg total) by mouth daily. 30 tablet 0  . nefazodone (SERZONE) 100 MG tablet Take 100 mg by mouth 2 (two) times daily.    Derrill Memo ON 11/21/2018] pantoprazole (PROTONIX) 40 MG tablet Take 1 tablet (40 mg total) by mouth daily. 30 tablet 3  . pantoprazole (PROTONIX) 40 MG tablet Take 1 tablet (40 mg total) by mouth 2 (two) times daily. Take twice daily  for 2 months then decrease to once daily. 122 tablet 0  . POTASSIUM CHLORIDE PO Take 1 tablet by mouth daily as needed (when taking lasix).     . pregabalin (LYRICA) 50 MG capsule Take 50 mg by mouth 3 (three) times daily.    . rosuvastatin (CRESTOR) 20 MG tablet Take 1 tablet (20 mg total) by mouth daily. 90 tablet 3  . zolpidem (AMBIEN) 10 MG tablet Take 10 mg by mouth at bedtime.      No current facility-administered medications for this visit.     PHYSICAL EXAMINATION: ECOG PERFORMANCE STATUS: 1 - Symptomatic but completely ambulatory  Today's Vitals   11/03/18 1004 11/03/18 1005  BP: (!) 140/57   Pulse: 84   Resp: 16   Temp: (!) 97.1 F (36.2 C)   TempSrc: Temporal   SpO2: 99%   Weight: 196  lb 12.8 oz (89.3 kg)   Height: _0  (1.727 m)   PainSc: 0-No pain 0-No pain   Body mass index is 29.92 kg/m.  Filed Weights   11/03/18 1004  Weight: 196 lb 12.8 oz (89.3 kg)    GENERAL: alert, no distress and comfortable SKIN: skin color, texture, turgor are normal, no rashes or significant lesions EYES: conjunctiva are pink and non-injected, sclera clear OROPHARYNX: no exudate, no erythema; lips, buccal mucosa, and tongue normal  NECK: supple, non-tender LYMPH:  no palpable lymphadenopathy in the cervical LUNGS: clear to auscultation with normal breathing effort HEART: regular rate & rhythm and no murmurs and no lower extremity edema ABDOMEN: soft, non-tender, non-distended, normal bowel sounds Musculoskeletal: no cyanosis of digits and no clubbing  PSYCH: alert & oriented x 3, fluent speech NEURO: no focal motor/sensory deficits  LABORATORY DATA:  I have reviewed the data as listed    Component Value Date/Time   NA 141 11/03/2018 0951   K 4.6 11/03/2018 0951   CL 101 11/03/2018 0951   CO2 31 11/03/2018 0951   GLUCOSE 111 (H) 11/03/2018 0951   BUN 12 11/03/2018 0951   CREATININE 0.83 11/03/2018 0951   CALCIUM 10.3 11/03/2018 0951   PROT 7.8 11/03/2018 0951   ALBUMIN  4.3 11/03/2018 0951   AST 23 11/03/2018 0951   ALT 11 11/03/2018 0951   ALKPHOS 93 11/03/2018 0951   BILITOT 0.6 11/03/2018 0951   GFRNONAA >60 11/03/2018 0951   GFRAA >60 11/03/2018 0951    No results found for: SPEP, UPEP  Lab Results  Component Value Date   WBC 20.6 (H) 11/03/2018   NEUTROABS 15.4 (H) 11/03/2018   HGB 14.1 11/03/2018   HCT 45.5 11/03/2018   MCV 96.0 11/03/2018   PLT 339 11/03/2018      Chemistry      Component Value Date/Time   NA 141 11/03/2018 0951   K 4.6 11/03/2018 0951   CL 101 11/03/2018 0951   CO2 31 11/03/2018 0951   BUN 12 11/03/2018 0951   CREATININE 0.83 11/03/2018 0951      Component Value Date/Time   CALCIUM 10.3 11/03/2018 0951   ALKPHOS 93 11/03/2018 0951   AST 23 11/03/2018 0951   ALT 11 11/03/2018 0951   BILITOT 0.6 11/03/2018 0951       RADIOGRAPHIC STUDIES: I have personally reviewed the radiological images as listed below and agreed with the findings in the report. No results found.

## 2018-11-04 ENCOUNTER — Other Ambulatory Visit: Payer: BC Managed Care – PPO

## 2018-11-04 ENCOUNTER — Ambulatory Visit: Payer: BC Managed Care – PPO | Admitting: Hematology

## 2018-11-08 ENCOUNTER — Telehealth: Payer: Self-pay | Admitting: Cardiology

## 2018-11-08 NOTE — Telephone Encounter (Signed)
° ° °  Pt c/o medication issue:  1. Name of Medication: empagliflozin (JARDIANCE) 10 MG TABS tablet  2. How are you currently taking this medication (dosage and times per day)? As written  3. Are you having a reaction (difficulty breathing--STAT)? rash  4. What is your medication issue? Rash on penis per patient

## 2018-11-08 NOTE — Telephone Encounter (Signed)
It may be from the Jardiance (predisposes to genital infections). I would stop that for now and recommend he sees his primary care doctor to make sure it doesn't need treatment. He should monitor his sugars when he stops the jardiance to make sure they don't increase. Thanks

## 2018-11-08 NOTE — Telephone Encounter (Signed)
The patient has been made aware and verbalized his understanding. He will keep track of his sugar and call his PCP for further assessment.

## 2018-11-08 NOTE — Telephone Encounter (Signed)
Returned the call to the patient. He stated that he had developed a rash on his male genitalia which he has had for the past 3-4 weeks. He stated that he has tried to use different soaps to see if that would help. He feels like it is from the [place].   He has been trying neosporin which has helped somewhat. He stated that it is only in that area. He would like to know what he can do for the rash and if he can try a different medication other than the Jardiance.

## 2018-11-17 DIAGNOSIS — M1611 Unilateral primary osteoarthritis, right hip: Secondary | ICD-10-CM | POA: Diagnosis not present

## 2018-11-17 DIAGNOSIS — M25551 Pain in right hip: Secondary | ICD-10-CM | POA: Diagnosis not present

## 2018-11-21 ENCOUNTER — Telehealth: Payer: Self-pay

## 2018-11-21 DIAGNOSIS — Z23 Encounter for immunization: Secondary | ICD-10-CM | POA: Diagnosis not present

## 2018-11-21 NOTE — Telephone Encounter (Signed)
Pt takes Eliquis for afib with CHADS2VASc score of 3 (HTN, DM, CAD/PAD). Renal function is normal. Ok to hold Eliquis for 3 days prior to THA.

## 2018-11-21 NOTE — Telephone Encounter (Signed)
Clinical pharmacist to review eliquis 

## 2018-11-21 NOTE — Telephone Encounter (Signed)
Called patient and explained the reason for my call was to reschedule his appointment with Dr. Harrell Gave to 2 weeks after his procedure/surgery. Patient verbalized an understanding. Patient appointment was rescheduled to 01/10/19 at 3:40PM.

## 2018-11-21 NOTE — Telephone Encounter (Signed)
   [facility] Pre-operative Risk Assessment    Request for surgical clearance:  1. What type of surgery is being performed? Right total hip arthroplasty  2. When is this surgery scheduled? 12/23/2018  3. What type of clearance is required (medical clearance vs. Pharmacy clearance to hold med vs. Both)? Both  4. Are there any medications that need to be held prior to surgery and how long? Eliquis to stop 3 days prior to procedure  5. Practice name and name of physician performing surgery? [facility] & Dr Paralee Cancel  6. What is your office phone number? [phone]   7.   What is your office fax number? [phone]  8.   Anesthesia type (None, local, MAC, general) ? spinal   [place] 11/21/2018, 8:12 AM  _________________________________________________________________   (provider comments below)

## 2018-11-21 NOTE — Telephone Encounter (Signed)
   Primary Cardiologist: Buford Dresser, MD  Chart reviewed as part of pre-operative protocol coverage. Patient was contacted 11/21/2018 in reference to pre-operative risk assessment for pending surgery as outlined below.  Justin [name] was last seen on 09/20/2018 by Dr. Harrell Gave.  Since that day, Justin [name] has done well without chest pain or significant dyspnea. He is able to walk 1/2 mile with his dog. He is unable to do strenuous activity due to hip issue  Therefore, based on ACC/AHA guidelines, the patient would be at acceptable risk for the planned procedure without further cardiovascular testing.   I will route this recommendation to the requesting party via Epic fax function and remove from pre-op pool.  Please call with questions. Per clinical pharmacist ok to hold eliquis for 3 days prior to the surgery and restart as soon as possible afterward.   I spoke with Dr. Aurea Graff surgery scheduler who mentioned aspirin will need to be held for 7 days given spinal block. If the patient is on aspirin, the surgery will need to be converted to general anesthesia. I will check with Dr. Harrell Gave, once obtain approval, will forward final cardiac clearance to the requesting provider.  Callback pool to push back office appointment to 2 weeks after the date of surgery (which is 12/4). Currently, the followup is scheduled on 12/7 which is 3 days after the surgery. Patient likely will need at least 2 weeks to recover enough to ambulate to the clinic.  [place], [place] 11/21/2018, 3:07 PM

## 2018-11-24 NOTE — Telephone Encounter (Signed)
He is within one year of CABG but is overall doing well. He does have a hematologic condition (polycythemia), and they should be aware of this as well if aspirin is held. Overall short term risk of holding aspirin is low, but it is not zero risk for him. If they feel that the benefit of spinal anesthesia over general anesthesia is significant, then he can hold aspiring for 7 days prior, but it should be restarted as soon as possible postoperatively. He should also be monitored on telemetry post op, and any chest pain postoperatively should be investigated. Thanks.

## 2018-11-24 NOTE — Telephone Encounter (Signed)
    Addendum to pre-op clearance previously sent:  Per Dr. Harrell Gave: "He is within one year of CABG but is overall doing well. He does have a hematologic condition (polycythemia), and they should be aware of this as well if aspirin is held. Overall short term risk of holding aspirin is low, but it is not zero risk for him. If they feel that the benefit of spinal anesthesia over general anesthesia is significant, then he can hold aspiring for 7 days prior, but it should be restarted as soon as possible postoperatively. He should also be monitored on telemetry post op, and any chest pain postoperatively should be investigated."   Kathyrn Drown NP-C [facility] Pager: [phone]

## 2018-11-25 DIAGNOSIS — I10 Essential (primary) hypertension: Secondary | ICD-10-CM | POA: Diagnosis not present

## 2018-11-25 DIAGNOSIS — E119 Type 2 diabetes mellitus without complications: Secondary | ICD-10-CM | POA: Diagnosis not present

## 2018-11-25 DIAGNOSIS — E785 Hyperlipidemia, unspecified: Secondary | ICD-10-CM | POA: Diagnosis not present

## 2018-11-25 DIAGNOSIS — I350 Nonrheumatic aortic (valve) stenosis: Secondary | ICD-10-CM | POA: Diagnosis not present

## 2018-12-03 ENCOUNTER — Other Ambulatory Visit: Payer: Self-pay | Admitting: Gastroenterology

## 2018-12-05 ENCOUNTER — Other Ambulatory Visit: Payer: Self-pay | Admitting: Gastroenterology

## 2018-12-05 MED ORDER — PANTOPRAZOLE SODIUM 40 MG PO TBEC: 40.0000 mg | DELAYED_RELEASE_TABLET | Freq: Every day | ORAL | 3 refills | Status: DC

## 2018-12-06 ENCOUNTER — Inpatient Hospital Stay: Payer: BC Managed Care – PPO | Attending: Hematology and Oncology

## 2018-12-06 ENCOUNTER — Telehealth: Payer: Self-pay | Admitting: *Deleted

## 2018-12-06 ENCOUNTER — Inpatient Hospital Stay (HOSPITAL_BASED_OUTPATIENT_CLINIC_OR_DEPARTMENT_OTHER): Payer: BC Managed Care – PPO | Admitting: Hematology

## 2018-12-06 ENCOUNTER — Encounter: Payer: Self-pay | Admitting: Hematology

## 2018-12-06 ENCOUNTER — Other Ambulatory Visit: Payer: Self-pay

## 2018-12-06 VITALS — BP 111/54 | HR 87 | Temp 97.1°F | Resp 20

## 2018-12-06 DIAGNOSIS — Z7984 Long term (current) use of oral hypoglycemic drugs: Secondary | ICD-10-CM | POA: Insufficient documentation

## 2018-12-06 DIAGNOSIS — Z7982 Long term (current) use of aspirin: Secondary | ICD-10-CM | POA: Diagnosis not present

## 2018-12-06 DIAGNOSIS — D473 Essential (hemorrhagic) thrombocythemia: Secondary | ICD-10-CM | POA: Diagnosis not present

## 2018-12-06 DIAGNOSIS — D72829 Elevated white blood cell count, unspecified: Secondary | ICD-10-CM | POA: Insufficient documentation

## 2018-12-06 DIAGNOSIS — Z79899 Other long term (current) drug therapy: Secondary | ICD-10-CM | POA: Diagnosis not present

## 2018-12-06 DIAGNOSIS — D75839 Thrombocytosis, unspecified: Secondary | ICD-10-CM

## 2018-12-06 DIAGNOSIS — Z791 Long term (current) use of non-steroidal anti-inflammatories (NSAID): Secondary | ICD-10-CM | POA: Insufficient documentation

## 2018-12-06 DIAGNOSIS — D45 Polycythemia vera: Secondary | ICD-10-CM

## 2018-12-06 DIAGNOSIS — Z7901 Long term (current) use of anticoagulants: Secondary | ICD-10-CM | POA: Insufficient documentation

## 2018-12-06 LAB — CBC WITH DIFFERENTIAL (CANCER CENTER ONLY)
Abs Immature Granulocytes: 2.62 10*3/uL — ABNORMAL HIGH (ref 0.00–0.07)
Basophils Absolute: 0.6 10*3/uL — ABNORMAL HIGH (ref 0.0–0.1)
Basophils Relative: 2 %
Eosinophils Absolute: 0.1 10*3/uL (ref 0.0–0.5)
Eosinophils Relative: 0 %
HCT: 50.3 % (ref 39.0–52.0)
Hemoglobin: 16.3 g/dL (ref 13.0–17.0)
Immature Granulocytes: 6 %
Lymphocytes Relative: 5 %
Lymphs Abs: 2.1 10*3/uL (ref 0.7–4.0)
MCH: 31.7 pg (ref 26.0–34.0)
MCHC: 32.4 g/dL (ref 30.0–36.0)
MCV: 97.7 fL (ref 80.0–100.0)
Monocytes Absolute: 2 10*3/uL — ABNORMAL HIGH (ref 0.1–1.0)
Monocytes Relative: 5 %
Neutro Abs: 33.3 10*3/uL — ABNORMAL HIGH (ref 1.7–7.7)
Neutrophils Relative %: 82 %
Platelet Count: 205 10*3/uL (ref 150–400)
RBC: 5.15 MIL/uL (ref 4.22–5.81)
WBC Count: 40.8 10*3/uL — ABNORMAL HIGH (ref 4.0–10.5)
nRBC: 0.1 % (ref 0.0–0.2)

## 2018-12-06 LAB — CMP (CANCER CENTER ONLY)
ALT: 9 U/L (ref 0–44)
AST: 20 U/L (ref 15–41)
Albumin: 4.5 g/dL (ref 3.5–5.0)
Alkaline Phosphatase: 119 U/L (ref 38–126)
Anion gap: 10 (ref 5–15)
BUN: 12 mg/dL (ref 8–23)
CO2: 29 mmol/L (ref 22–32)
Calcium: 10.4 mg/dL — ABNORMAL HIGH (ref 8.9–10.3)
Chloride: 98 mmol/L (ref 98–111)
Creatinine: 0.89 mg/dL (ref 0.61–1.24)
GFR, Est AFR Am: >60 mL/min (ref >60)
GFR, Estimated: >60 mL/min (ref >60)
Glucose, Bld: 103 mg/dL — ABNORMAL HIGH (ref 70–99)
Potassium: 4.2 mmol/L (ref 3.5–5.1)
Sodium: 137 mmol/L (ref 135–145)
Total Bilirubin: 0.6 mg/dL (ref 0.3–1.2)
Total Protein: 7.8 g/dL (ref 6.5–8.1)

## 2018-12-06 LAB — LACTATE DEHYDROGENASE: LDH: 490 U/L — ABNORMAL HIGH (ref 98–192)

## 2018-12-06 NOTE — Telephone Encounter (Signed)
Spoke with patient, per dr Maylon Peppers, patient's calcium is slightly elevated. He should increase hydration. Patient verbalized understanding.

## 2018-12-06 NOTE — Progress Notes (Addendum)
[facility] OFFICE PROGRESS NOTE  Patient Care Team: [name], MD as PCP - General (Family Medicine) Buford Dresser, MD as PCP - Cardiology (Cardiology)  HEME/ONC OVERVIEW: 1. Essential thrombocythemia, JAK2 V617 mutation+; high risk (age >28) -Previous patient of Dr. 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 CAD s/p CABG x 4 in 12/2017 2. Moderate to severe aortic stenosis s/p aortic valve replacement and left atrial clip in 12/2017   ASSESSMENT & PLAN:   Essential thrombocythemia, JAK2+; high risk  -Previous patient of Dr. RAudelia Hives-Plt count 205k, improving -Currently on Hydrea 5048mBID, resumed since 10/04/2018  -Due to the drop in plt count since 10/2018, and the patient is scheduled to a right hip replacement on 12/23/2018, I have reduced Hydrea to 50050mID, alternating with 500m104mily, to avoid any potential progressive thrombocytopenia prior to the hip surgery -We will repeat labs in 2 weeks to monitor leukocytosis and plt count  -On ASA 81mg54mly   Leukocytosis -Most likely secondary to ET  -WBC 40.8k today, significantly higher than the last visit -Clinically, patient denies any constitutional symptoms or symptoms of  infection -I personally reviewed the peripheral blood smear, which showed increased number of mature neutrophils with slight hypersegmentation but no significant increase in immature granulocytes.  There was no circulating blast.  The morphology was otherwise normal.  -Hydrea dose adjustment as above -Repeat labs in 2 weeks to monitor WBC trend  -Furthermore, due to progressive leukocytosis with neutrophil predominance, I have ordered CT chest, abdomen/pelvis w/ contrast to rule out any infection   Hypercalcemia -Ca 10.4 today, slightly elevated -Patient denies any associated symptoms -I encouraged the patient to maintain adequate hydration -We will monitor it for now   No orders of the defined types were placed in this encounter.  All questions were answered. The patient knows to call the clinic with any problems, questions or concerns. No barriers to learning was detected.  Return in 2 weeks for labs only, and in 4 weeks for labs and clinic appt.   [name]11/17/2020 10:40 AM  CHIEF COMPLAINT: "I am feeling fine"  INTERVAL HISTORY: Justin [name] to clinic for follow-up of high risk essential thrombocythemia on Hydrea.  The patient reports that he has been having persistent right neck pain, and is currently scheduled for a right hip replacement on 12/23/2018.  He has been taking Hydrea 500 mg twice a day and denies any abnormal side effects, such as nausea, vomiting, or diarrhea.  He is otherwise feeling well today, and denies any complaint.  REVIEW OF SYSTEMS:   Constitutional: ( - ) fevers, ( - )  chills , ( - ) night sweats Eyes: ( - ) blurriness of vision, ( - ) double vision, ( - ) watery eyes Ears, nose, mouth, throat, and face: ( - )  mucositis, ( - ) sore throat Respiratory: ( - ) cough, ( - ) dyspnea, ( - ) wheezes Cardiovascular: ( - ) palpitation, ( - ) chest discomfort, ( - ) lower extremity swelling Gastrointestinal:  ( - ) nausea, ( - ) heartburn, ( - ) change  in bowel habits Skin: ( - ) abnormal skin rashes Lymphatics: ( - ) new lymphadenopathy, ( - ) easy bruising Neurological: ( - ) numbness, ( - ) tingling, ( - ) new weaknesses Behavioral/Psych: ( - ) mood change, ( - ) new changes  All other systems were reviewed with the patient and are negative.  SUMMARY OF ONCOLOGIC HISTORY: Oncology History   No history exists.    I have reviewed the past medical history, past surgical history, social history and family history with the patient and they are unchanged from previous note.  ALLERGIES:  is allergic to codeine.  MEDICATIONS:  Current Outpatient Medications  Medication Sig Dispense Refill  . apixaban (ELIQUIS) 5 MG TABS tablet Take 1 tablet (5 mg total) by mouth 2 (two) times daily. 60 tablet   . aspirin 81 MG tablet Take 81 mg by mouth daily.     . chlorhexidine (PERIDEX) 0.12 % solution     . Cholecalciferol 125 MCG (5000 UT) capsule Take 1 capsule by mouth daily.     . cyclobenzaprine (FLEXERIL) 10 MG tablet Take 10 mg by mouth 3 (three) times daily.     . ferrous sulfate 325 (65 FE) MG tablet Take 1 tablet (325 mg total) by mouth 2 (two) times daily with a meal. 60 tablet 0  . fluticasone (FLONASE) 50 MCG/ACT nasal spray Place 2 sprays into both nostrils daily.    . furosemide (LASIX) 20 MG tablet Take 20 mg by mouth daily as needed for fluid.     Marland Kitchen HYDROcodone-acetaminophen (NORCO/VICODIN) 5-325 MG tablet     . loratadine (CLARITIN) 10 MG tablet Take 10 mg by mouth daily.    . meloxicam (MOBIC) 15 MG tablet Take 15 mg by mouth daily.    . metFORMIN (GLUCOPHAGE-XR) 500 MG 24 hr tablet Take 500 mg by mouth daily before supper.    . metoprolol succinate (TOPROL XL) 25 MG 24 hr tablet Take 0.5 tablets (12.5 mg total) by mouth daily. 30 tablet 0  . Multiple Vitamins-Minerals (CENTRUM ADULTS) TABS Take 1 capsule by mouth daily.    . nefazodone (SERZONE) 100 MG tablet Take 100 mg by mouth 2 (two) times daily.    . pantoprazole (PROTONIX)  40 MG tablet Take 1 tablet (40 mg total) by mouth daily. 90 tablet 3  . POTASSIUM CHLORIDE PO Take 1 tablet by mouth daily as needed (when taking lasix).     . pregabalin (LYRICA) 50 MG capsule Take 50 mg by mouth 3 (three) times daily.    . rosuvastatin (CRESTOR) 20 MG tablet Take 1 tablet (20 mg total) by mouth daily. 90 tablet 3  . zolpidem (AMBIEN) 10 MG tablet Take 10 mg by mouth at bedtime.     Marland Kitchen amoxicillin (AMOXIL) 500 MG capsule Take 1 capsule (500 mg total) by mouth once as needed for up to 4 doses. Take four 500 mg capsules by mouth 1 hour before dental visits (Patient not taking: Reported on 12/06/2018) 4 capsule 12   No current facility-administered medications for this visit.     PHYSICAL EXAMINATION: ECOG PERFORMANCE STATUS: 1 - Symptomatic but completely ambulatory  Today's Vitals   12/06/18 1028 12/06/18 1030  BP: (!) 111/54   Pulse: 87   Resp: 20   Temp: (!) 97.1 F (36.2 C)   TempSrc: Tympanic   SpO2: 98%   PainSc:  0-No pain   There is no height or weight on file to calculate BMI.  There were no vitals filed for this visit.  GENERAL: alert, no distress and comfortable SKIN: skin color, texture, turgor are normal, no rashes or significant lesions EYES: conjunctiva are pink and non-injected, sclera clear OROPHARYNX: no exudate, no erythema; lips, buccal mucosa, and tongue normal  NECK: supple, non-tender LYMPH:  no palpable lymphadenopathy in the cervical LUNGS: clear to auscultation with normal breathing effort HEART: regular rate & rhythm and no murmurs and no lower extremity edema ABDOMEN: soft, non-tender, non-distended, normal bowel sounds PSYCH: alert & oriented x 3, fluent speech  LABORATORY DATA:  I have reviewed the data as listed    Component Value Date/Time   NA 141 11/03/2018 0951   K 4.6 11/03/2018 0951   CL 101 11/03/2018 0951   CO2 31 11/03/2018 0951   GLUCOSE 111 (H) 11/03/2018 0951   BUN 12 11/03/2018 0951   CREATININE 0.83 11/03/2018  0951   CALCIUM 10.3 11/03/2018 0951   PROT 7.8 11/03/2018 0951   ALBUMIN 4.3 11/03/2018 0951   AST 23 11/03/2018 0951   ALT 11 11/03/2018 0951   ALKPHOS 93 11/03/2018 0951   BILITOT 0.6 11/03/2018 0951   GFRNONAA >60 11/03/2018 0951   GFRAA >60 11/03/2018 0951    No results found for: SPEP, UPEP  Lab Results  Component Value Date   WBC 40.8 (H) 12/06/2018   NEUTROABS PENDING 12/06/2018   HGB 16.3 12/06/2018   HCT 50.3 12/06/2018   MCV 97.7 12/06/2018   PLT 205 12/06/2018      Chemistry      Component Value Date/Time   NA 141 11/03/2018 0951   K 4.6 11/03/2018 0951   CL 101 11/03/2018 0951   CO2 31 11/03/2018 0951   BUN 12 11/03/2018 0951   CREATININE 0.83 11/03/2018 0951      Component Value Date/Time   CALCIUM 10.3 11/03/2018 0951   ALKPHOS 93 11/03/2018 0951   AST 23 11/03/2018 0951   ALT 11 11/03/2018 0951   BILITOT 0.6 11/03/2018 0951       RADIOGRAPHIC STUDIES: I have personally reviewed the radiological images as listed below and agreed with the findings in the report. No results found.

## 2018-12-07 NOTE — Addendum Note (Signed)
Addended by: Tish Men on: 12/07/2018 08:06 AM   Modules accepted: Orders

## 2018-12-09 ENCOUNTER — Ambulatory Visit (HOSPITAL_BASED_OUTPATIENT_CLINIC_OR_DEPARTMENT_OTHER)
Admission: RE | Admit: 2018-12-09 | Discharge: 2018-12-09 | Disposition: A | Discharge status: Home / Self Care | Payer: BC Managed Care – PPO | Source: Ambulatory Visit | Attending: Hematology | Admitting: Hematology

## 2018-12-09 ENCOUNTER — Telehealth: Payer: Self-pay | Admitting: *Deleted

## 2018-12-09 ENCOUNTER — Other Ambulatory Visit: Payer: Self-pay

## 2018-12-09 DIAGNOSIS — J439 Emphysema, unspecified: Secondary | ICD-10-CM | POA: Insufficient documentation

## 2018-12-09 DIAGNOSIS — R161 Splenomegaly, not elsewhere classified: Secondary | ICD-10-CM | POA: Diagnosis not present

## 2018-12-09 DIAGNOSIS — D72829 Elevated white blood cell count, unspecified: Secondary | ICD-10-CM | POA: Diagnosis not present

## 2018-12-09 DIAGNOSIS — K760 Fatty (change of) liver, not elsewhere classified: Secondary | ICD-10-CM | POA: Insufficient documentation

## 2018-12-09 DIAGNOSIS — D473 Essential (hemorrhagic) thrombocythemia: Secondary | ICD-10-CM

## 2018-12-09 DIAGNOSIS — M1611 Unilateral primary osteoarthritis, right hip: Secondary | ICD-10-CM | POA: Diagnosis not present

## 2018-12-09 DIAGNOSIS — E782 Mixed hyperlipidemia: Secondary | ICD-10-CM | POA: Diagnosis not present

## 2018-12-09 DIAGNOSIS — I10 Essential (primary) hypertension: Secondary | ICD-10-CM | POA: Diagnosis not present

## 2018-12-09 DIAGNOSIS — Z79899 Other long term (current) drug therapy: Secondary | ICD-10-CM | POA: Diagnosis not present

## 2018-12-09 DIAGNOSIS — I2510 Atherosclerotic heart disease of native coronary artery without angina pectoris: Secondary | ICD-10-CM | POA: Insufficient documentation

## 2018-12-09 DIAGNOSIS — E119 Type 2 diabetes mellitus without complications: Secondary | ICD-10-CM | POA: Diagnosis not present

## 2018-12-09 MED ORDER — IOHEXOL 300 MG/ML  SOLN
100.0000 mL | Freq: Once | INTRAMUSCULAR | Status: AC | PRN
  Administered 2018-12-09: 100 mL via INTRAVENOUS

## 2018-12-09 NOTE — Telephone Encounter (Signed)
-----   Message from Tish Men, MD sent at 12/07/2018  8:23 AM EST ----- Regarding: RE: CT CAP Thanks.  GZ  ----- Message ----- From: Gean Maidens Sent: 12/07/2018   8:22 AM EST To: Tish Men, MD Subject: RE: CT CAP                                     Yes will do ----- Message ----- From: Tish Men, MD Sent: 12/07/2018   8:06 AM EST To: Randolm Idol, RN, Harriette Bouillon Hedgebeth Subject: CT CAP                                         Hi Vonte,  I ordered a CT for Justin [name]. Can you see if we can get it approved before the end of the week, so that we can try to get it scheduled next Monday?Rasheed Welty, can you help get this scheduled ASAP? I will call the patient and let him know that I have ordered CT.   Thanks.  Justin [name]

## 2018-12-09 NOTE — Telephone Encounter (Signed)
Called home and cell and left messages that CT scan was scheduled for today at 1:30. Radiology also called patient yesterday and actually spoke with patient and he already has contrast.

## 2018-12-12 NOTE — H&P (Signed)
TOTAL HIP ADMISSION H&P  Patient is admitted for right total hip arthroplasty, anterior approach.  Subjective:  Chief Complaint:   Right hip primary OA / pain  HPI: Justin [name], 63 y.o. male, has a history of pain and functional disability in the right hip(s) due to arthritis and patient has failed non-surgical conservative treatments for greater than 12 weeks to include NSAID's and/or analgesics and activity modification.  Onset of symptoms was gradual starting 2+ years ago with gradually worsening course since that time.The patient noted no past surgery on the right hip(s).  Patient currently rates pain in the right hip at 10 out of 10 with activity. Patient has night pain, worsening of pain with activity and weight bearing, trendelenberg gait, pain that interfers with activities of daily living and pain with passive range of motion. Patient has evidence of periarticular osteophytes and joint space narrowing by imaging studies. This condition presents safety issues increasing the risk of falls.  There is no current active infection.  Risks, benefits and expectations were discussed with the patient.  Risks including but not limited to the risk of anesthesia, blood clots, nerve damage, blood vessel damage, failure of the prosthesis, infection and up to and including death.  Patient understand the risks, benefits and expectations and wishes to proceed with surgery.   PCP: [name], MD  D/C Plans:       Home  Post-op Meds:       No Rx given   Tranexamic Acid:      To be given - IV   Decadron:      Is to be given  FYI:     Eliquis   Cardio - wanted a Telemetry bed post-op  Norco  DME:   Rx sent for - RW & 3-n-1  PT:  HEP  Pharmacy: [facility] - [address]   Patient Active Problem List   Diagnosis Date Noted  . PAD (peripheral artery disease) ([place]) 09/20/2018  . Melena   . Acute gastric ulcer with hemorrhage   . Symptomatic anemia 08/10/2018  . Acute blood loss anemia  08/10/2018  . GI bleed 08/10/2018  . Fatty liver   . Normocytic anemia 07/19/2018  . History of CEA (carotid endarterectomy) 02/09/2018  . History of tobacco use 02/02/2018  . Carotid stenosis 01/25/2018  . S/P CABG x 4 01/03/2018  . S/P AVR (aortic valve replacement) 01/03/2018  . Coronary artery disease 01/03/2018  . Essential thrombocythemia ([place]) 12/21/2017  . Bicuspid aortic valve determined by imaging 12/09/2017  . Claudication ([place]) 12/09/2017  . Sleep apnea 12/09/2017  . Left ventricular systolic dysfunction without heart failure 12/09/2017  . Aortic stenosis 12/01/2017  . Erythrocytosis 11/19/2017  . Atrial fibrillation ([place]) 11/12/2017  . Essential hypertension 10/19/2013  . Pulmonary infiltrates 09/27/2013  . Cough 09/27/2013   Past Medical History:  Diagnosis Date  . Acute meniscal tear of knee LEFT  . Aortic stenosis 12/01/2017   NONRHEUMATIC, AORTIC VALVE CALCIFICATIONS, MILD TO MODERATE REGURG, MILD TO MODERATE CALCIFIED ANNULUS per ECHO 10/25/17 @ [facility]  . Arthritis   . Atrial fibrillation ([place]) 11/12/2017   AT O/V WITH PCP  . DM (diabetes mellitus) ([place])   . Heart murmur MILD-- ASYMPTOMATIC  . Hyperlipidemia   . Hypertension   . Left knee pain   . PAD (peripheral artery disease) ([facility])    left leg claudication    Past Surgical History:  Procedure Laterality Date  . AORTIC VALVE REPLACEMENT N/A 01/03/2018  Procedure: AORTIC VALVE REPLACEMENT (AVR) using 24mm Magna Ease Bioprosthesis Aortic Valve;  Surgeon: Ivin Poot, MD;  Location: [facility];  Service: Open Heart Surgery;  Laterality: N/A;  . APPENDECTOMY  1998  . BIOPSY  08/11/2018   Procedure: BIOPSY;  Surgeon: Mauri Pole, MD;  Location: [facility];  Service: Endoscopy;;  . CATARACT EXTRACTION W/ INTRAOCULAR LENS  IMPLANT, BILATERAL  1998/  2000  . CERVICAL FUSION  1985   C4 - 5  . CORONARY ARTERY BYPASS GRAFT N/A 01/03/2018   Procedure: CORONARY ARTERY BYPASS GRAFTING  (CABG) x 4 (LIMA to LAD, SVG to DIAGONAL, SVG to RAMUS INTERMEDIATE, and SVG to PDA), USING LEFT INFTERNAL MAMMARY ARTERY AND;  Surgeon: Prescott Gum, Collier [place], MD;  Location: [facility];  Service: Open Heart Surgery;  Laterality: N/A;  . ENDARTERECTOMY Right 01/03/2018   Procedure: ENDARTERECTOMY CAROTID;  Surgeon: Marty Heck, MD;  Location: [facility] OR;  Service: Vascular;  Laterality: Right;  . ESOPHAGOGASTRODUODENOSCOPY (EGD) WITH PROPOFOL N/A 08/11/2018   Procedure: ESOPHAGOGASTRODUODENOSCOPY (EGD) WITH PROPOFOL;  Surgeon: Mauri Pole, MD;  Location: [facility];  Service: Endoscopy;  Laterality: N/A;  . KNEE ARTHROSCOPY W/ MENISCECTOMY  1991   LEFT KNEE  . NASAL SINUS SURGERY  1982  . RIGHT/LEFT HEART CATH AND CORONARY ANGIOGRAPHY N/A 12/24/2017   Procedure: RIGHT/LEFT HEART CATH AND CORONARY ANGIOGRAPHY;  Surgeon: Jolaine Artist, MD;  Location: [facility] CV LAB;  Service: Cardiovascular;  Laterality: N/A;  . ROTATOR CUFF REPAIR  09-04-2005   LEFT SHOULDER  . TEE WITHOUT CARDIOVERSION N/A 12/24/2017   Procedure: TRANSESOPHAGEAL ECHOCARDIOGRAM (TEE);  Surgeon: Jolaine Artist, MD;  Location[facility] END[facility]COPY;  Service: Cardiovascular;  Laterality: N/A;  . TEE WITHOUT CARDIOVERSION N/A 01/03/2018   Procedure: TRANSESOPHAGEAL ECHOCARDIOGRAM (TEE);  Surgeon: Prescott Gum, Collier Salina, MD;  Location: [facility];  Service: Open Heart Surgery;  Laterality: N/A;  . TONSILLECTOMY  AGE 58    No current facility-administered medications for this encounter.    Current Outpatient Medications  Medication Sig Dispense Refill Last Dose  . amoxicillin (AMOXIL) 500 MG capsule Take 1 capsule (500 mg total) by mouth once as needed for up to 4 doses. Take four 500 mg capsules by mouth 1 hour before dental visits (Patient not taking: Reported on 12/06/2018) 4 capsule 12 Not Taking  . apixaban (ELIQUIS) 5 MG TABS tablet Take 1 tablet (5 mg total) by mouth 2 (two) times daily. 60 tablet  Taking  . aspirin 81 MG tablet  Take 81 mg by mouth daily.    Taking  . chlorhexidine (PERIDEX) 0.12 % solution    Taking  . Cholecalciferol 125 MCG (5000 UT) capsule Take 1 capsule by mouth daily.    Taking  . cyclobenzaprine (FLEXERIL) 10 MG tablet Take 10 mg by mouth 3 (three) times daily.    Taking  . ferrous sulfate 325 (65 FE) MG tablet Take 1 tablet (325 mg total) by mouth 2 (two) times daily with a meal. 60 tablet 0 Taking  . fluticasone (FLONASE) 50 MCG/ACT nasal spray Place 2 sprays into both nostrils daily.   Taking  . furosemide (LASIX) 20 MG tablet Take 20 mg by mouth daily as needed for fluid.    Taking  . HYDROcodone-acetaminophen (NORCO/VICODIN) 5-325 MG tablet    Taking  . loratadine (CLARITIN) 10 MG tablet Take 10 mg by mouth daily.   Taking  . meloxicam (MOBIC) 15 MG tablet Take 15 mg by mouth daily.   Taking  .  metFORMIN (GLUCOPHAGE-XR) 500 MG 24 hr tablet Take 500 mg by mouth daily before supper.   Taking  . metoprolol succinate (TOPROL XL) 25 MG 24 hr tablet Take 0.5 tablets (12.5 mg total) by mouth daily. 30 tablet 0 Taking  . Multiple Vitamins-Minerals (CENTRUM ADULTS) TABS Take 1 capsule by mouth daily.     . nefazodone (SERZONE) 100 MG tablet Take 100 mg by mouth 2 (two) times daily.   Taking  . pantoprazole (PROTONIX) 40 MG tablet Take 1 tablet (40 mg total) by mouth daily. 90 tablet 3 Taking  . POTASSIUM CHLORIDE PO Take 1 tablet by mouth daily as needed (when taking lasix).    Taking  . pregabalin (LYRICA) 50 MG capsule Take 50 mg by mouth 3 (three) times daily.   Taking  . rosuvastatin (CRESTOR) 20 MG tablet Take 1 tablet (20 mg total) by mouth daily. 90 tablet 3 Taking  . zolpidem (AMBIEN) 10 MG tablet Take 10 mg by mouth at bedtime.    Taking   Allergies  Allergen Reactions  . Codeine Swelling    SWELLING REACTION UNSPECIFIED  Tolerates hydrocodone and oxycodone     Social History   Tobacco Use  . Smoking status: Former Smoker    Packs/day: 0.25    Years: 23.00    Pack years: 5.75     Types: Cigars    Quit date: 01/02/2018    Years since quitting: 0.9  . Smokeless tobacco: Never Used  . Tobacco comment: quit cigs in 2011 and smokes cigars daily- from 3-10 cigars-09/27/13  Substance Use Topics  . Alcohol use: Not Currently    Alcohol/week: 0.0 standard drinks    Comment: occasional    Family History  Adopted: Yes  Problem Relation Age of Onset  . Bladder Cancer Father        adopted father     Review of Systems  Constitutional: Negative.   HENT: Negative.   Eyes: Negative.   Respiratory: Negative.   Cardiovascular: Negative.   Gastrointestinal: Negative.   Genitourinary: Negative.   Musculoskeletal: Positive for back pain and joint pain.  Skin: Negative.   Neurological: Negative.   Endo/Heme/Allergies: Negative.   Psychiatric/Behavioral: Negative.     Objective:  Physical Exam  Constitutional: He is oriented to person, place, and time. He appears well-developed.  HENT:  Head: Normocephalic.  Eyes: Pupils are equal, round, and reactive to light.  Neck: Neck supple. No JVD present. No tracheal deviation present. No thyromegaly present.  Cardiovascular: Normal rate, regular rhythm and intact distal pulses.  Respiratory: Effort normal and breath sounds normal. No respiratory distress. He has no wheezes.  GI: Soft. There is no abdominal tenderness. There is no guarding.  Musculoskeletal:     Right hip: He exhibits decreased range of motion, decreased strength, tenderness and bony tenderness. He exhibits no swelling, no deformity and no laceration.  Lymphadenopathy:    He has no cervical adenopathy.  Neurological: He is alert and oriented to person, place, and time. A sensory deficit (bilateral DM neuropathy) is present.  Skin: Skin is warm and dry.  Psychiatric: He has a normal mood and affect.      Labs:  Estimated body mass index is 29.92 kg/m as calculated from the following:   Height as of 11/03/18: 5\' 8"  (1.727 m).   Weight as of 11/03/18:  89.3 kg.   Imaging Review Plain radiographs demonstrate severe degenerative joint disease of the right hip(s). The bone quality appears to be good for age  and reported activity level.      Assessment/Plan:  End stage arthritis, right hip(s)  The patient history, physical examination, clinical judgement of the provider and imaging studies are consistent with end stage degenerative joint disease of the right hip(s) and total hip arthroplasty is deemed medically necessary. The treatment options including medical management, injection therapy, arthroscopy and arthroplasty were discussed at length. The risks and benefits of total hip arthroplasty were presented and reviewed. The risks due to aseptic loosening, infection, stiffness, dislocation/subluxation,  thromboembolic complications and other imponderables were discussed.  The patient acknowledged the explanation, agreed to proceed with the plan and consent was signed. Patient is being admitted for inpatient treatment for surgery, pain control, PT, OT, prophylactic antibiotics, VTE prophylaxis, progressive ambulation and ADL's and discharge planning.The patient is planning to be discharged home.    [place] Sanjna Haskew   PA-C  12/12/2018, 12:34 PM

## 2018-12-19 ENCOUNTER — Other Ambulatory Visit: Payer: Self-pay

## 2018-12-19 ENCOUNTER — Telehealth (HOSPITAL_COMMUNITY): Payer: Self-pay

## 2018-12-19 DIAGNOSIS — I6521 Occlusion and stenosis of right carotid artery: Secondary | ICD-10-CM

## 2018-12-19 NOTE — Patient Instructions (Signed)
DUE TO COVID-19 ONLY ONE VISITOR IS ALLOWED TO COME WITH YOU AND STAY IN THE WAITING ROOM ONLY DURING PRE OP AND PROCEDURE DAY OF SURGERY. THE 1 VISITOR MAY VISIT WITH YOU AFTER SURGERY IN YOUR PRIVATE ROOM DURING VISITING HOURS ONLY!  YOU NEED TO HAVE A COVID 19 TEST ON_______ @_______ , THIS TEST MUST BE DONE BEFORE SURGERY, COME  [address] [place] , [id number].  ([place]) ONCE YOUR COVID TEST IS COMPLETED, PLEASE BEGIN THE QUARANTINE INSTRUCTIONS AS OUTLINED IN YOUR HANDOUT.                Justin [name]  12/19/2018   Your procedure is scheduled on: 12-23-18   Report to [facility] Main  Entrance   Report to admitting at         1100 AM     Call this number if you have problems the morning of surgery [phone]    Remember: NO SOLID FOOD AFTER MIDNIGHT THE NIGHT PRIOR TO SURGERY. NOTHING BY MOUTH EXCEPT CLEAR LIQUIDS UNTIL     1030 am  . PLEASE FINISH ENSURE DRINK PER SURGEON ORDER  WHICH NEEDS TO BE COMPLETED AT     1030 am then nothing by mouth .    CLEAR LIQUID DIET   Foods Allowed                                                                                       Foods Excluded  Coffee and tea, regular and decaf   No creamer                          liquids that you cannot  Plain Jell-O any favor except red or purple                                           see through such as: Fruit ices (not with fruit pulp)                                                        milk, soups, orange juice  Iced Popsicles                                                         All solid food Carbonated beverages, regular and diet                                    Cranberry, grape and apple juices Sports drinks like Gatorade Lightly seasoned clear broth or consume(fat free) Sugar, honey syrup   _____________________________________________________________________  BRUSH YOUR TEETH MORNING OF SURGERY AND RINSE YOUR MOUTH OUT, NO CHEWING  GUM CANDY OR MINTS.     Take these medicines the morning of surgery with A SIP OF WATER: lyrica, nefazodone, metoprolol, loratadine, hydroxyurea, flonase flexeril, hydrocodone if needed  DO NOT TAKE ANY DIABETIC MEDICATIONS DAY OF YOUR SURGERY                               You may not have any metal on your body including hair pins and              piercings  Do not wear jewelry,  lotions, powders or perfumes, deodorant                        Men may shave face and neck.   Do not bring valuables to the hospital. [place].  Contacts, dentures or bridgework may not be worn into surgery.  _____________________________________________________________________           [facility] - Preparing for Surgery Before surgery, you can play an important role.  Because skin is not sterile, your skin needs to be as free of germs as possible.  You can reduce the number of germs on your skin by washing with CHG (chlorahexidine gluconate) soap before surgery.  CHG is an antiseptic cleaner which kills germs and bonds with the skin to continue killing germs even after washing. Please DO NOT use if you have an allergy to CHG or antibacterial soaps.  If your skin becomes reddened/irritated stop using the CHG and inform your nurse when you arrive at Short Stay. Do not shave (including legs and underarms) for at least 48 hours prior to the first CHG shower.  You may shave your face/neck. Please follow these instructions carefully:  1.  Shower with CHG Soap the night before surgery and the  morning of Surgery.  2.  If you choose to wash your hair, wash your hair first as usual with your  normal  shampoo.  3.  After you shampoo, rinse your hair and body thoroughly to remove the  shampoo.                           4.  Use CHG as you would any other liquid soap.  You can apply chg directly  to the skin and wash                       Gently with a scrungie or clean  washcloth.  5.  Apply the CHG Soap to your body ONLY FROM THE NECK DOWN.   Do not use on face/ open                           Wound or open sores. Avoid contact with eyes, ears mouth and genitals (private parts).                       Wash face,  Genitals (private parts) with your normal soap.             6.  Wash thoroughly, paying special attention to the area where your surgery  will be performed.  7.  Thoroughly rinse your body with warm water from the neck down.  8.  DO NOT shower/wash with your normal soap after using and rinsing off  the CHG Soap.                9.  Pat yourself dry with a clean towel.            10.  Wear clean pajamas.            11.  Place clean sheets on your bed the night of your first shower and do not  sleep with pets. Day of Surgery : Do not apply any lotions/deodorants the morning of surgery.  Please wear clean clothes to the hospital/surgery center.  FAILURE TO FOLLOW THESE INSTRUCTIONS MAY RESULT IN THE CANCELLATION OF YOUR SURGERY PATIENT SIGNATURE_________________________________  NURSE SIGNATURE__________________________________  ________________________________________________________________________   Justin [name]  An incentive spirometer is a tool that can help keep your lungs clear and active. This tool measures how well you are filling your lungs with each breath. Taking long deep breaths may help reverse or decrease the chance of developing breathing (pulmonary) problems (especially infection) following:  A long period of time when you are unable to move or be active. BEFORE THE PROCEDURE   If the spirometer includes an indicator to show your best effort, your nurse or respiratory therapist will set it to a desired goal.  If possible, sit up straight or lean slightly forward. Try not to slouch.  Hold the incentive spirometer in an upright position. INSTRUCTIONS FOR USE  1. Sit on the edge of your bed if possible, or sit up as far  as you can in bed or on a chair. 2. Hold the incentive spirometer in an upright position. 3. Breathe out normally. 4. Place the mouthpiece in your mouth and seal your lips tightly around it. 5. Breathe in slowly and as deeply as possible, raising the piston or the ball toward the top of the column. 6. Hold your breath for 3-5 seconds or for as long as possible. Allow the piston or ball to fall to the bottom of the column. 7. Remove the mouthpiece from your mouth and breathe out normally. 8. Rest for a few seconds and repeat Steps 1 through 7 at least 10 times every 1-2 hours when you are awake. Take your time and take a few normal breaths between deep breaths. 9. The spirometer may include an indicator to show your best effort. Use the indicator as a goal to work toward during each repetition. 10. After each set of 10 deep breaths, practice coughing to be sure your lungs are clear. If you have an incision (the cut made at the time of surgery), support your incision when coughing by placing a pillow or rolled up towels firmly against it. Once you are able to get out of bed, walk around indoors and cough well. You may stop using the incentive spirometer when instructed by your caregiver.  RISKS AND COMPLICATIONS  Take your time so you do not get dizzy or light-headed.  If you are in pain, you may need to take or ask for pain medication before doing incentive spirometry. It is harder to take a deep breath if you are having pain. AFTER USE  Rest and breathe slowly and easily.  It can be helpful to keep track of a log of your progress. Your caregiver can provide you with a simple table to help with this. If you are using  the spirometer at home, follow these instructions: [place] IF:   You are having difficultly using the spirometer.  You have trouble using the spirometer as often as instructed.  Your pain medication is not giving enough relief while using the spirometer.  You  develop fever of 100.5 F (38.1 C) or higher. SEEK IMMEDIATE MEDICAL CARE IF:   You cough up bloody sputum that had not been present before.  You develop fever of 102 F (38.9 C) or greater.  You develop worsening pain at or near the incision site. MAKE SURE YOU:   Understand these instructions.  Will watch your condition.  Will get help right away if you are not doing well or get worse. Document Released: 05/18/2006 Document Revised: 03/30/2011 Document Reviewed: 07/19/2006 ExitCare Patient Information 2014 ExitCare, [place].   ________________________________________________________________________  WHAT IS A BLOOD TRANSFUSION? Blood Transfusion Information  A transfusion is the replacement of blood or some of its parts. Blood is made up of multiple cells which provide different functions.  Red blood cells carry oxygen and are used for blood loss replacement.  White blood cells fight against infection.  Platelets control bleeding.  Plasma helps clot blood.  Other blood products are available for specialized needs, such as hemophilia or other clotting disorders. BEFORE THE TRANSFUSION  Who gives blood for transfusions?   Healthy volunteers who are fully evaluated to make sure their blood is safe. This is blood bank blood. Transfusion therapy is the safest it has ever been in the practice of medicine. Before blood is taken from a donor, a complete history is taken to make sure that person has no history of diseases nor engages in risky social behavior (examples are intravenous drug use or sexual activity with multiple partners). The donor's travel history is screened to minimize risk of transmitting infections, such as malaria. The donated blood is tested for signs of infectious diseases, such as HIV and hepatitis. The blood is then tested to be sure it is compatible with you in order to minimize the chance of a transfusion reaction. If you or a relative donates blood, this is  often done in anticipation of surgery and is not appropriate for emergency situations. It takes many days to process the donated blood. RISKS AND COMPLICATIONS Although transfusion therapy is very safe and saves many lives, the main dangers of transfusion include:   Getting an infectious disease.  Developing a transfusion reaction. This is an allergic reaction to something in the blood you were given. Every precaution is taken to prevent this. The decision to have a blood transfusion has been considered carefully by your caregiver before blood is given. Blood is not given unless the benefits outweigh the risks. AFTER THE TRANSFUSION  Right after receiving a blood transfusion, you will usually feel much better and more energetic. This is especially true if your red blood cells have gotten low (anemic). The transfusion raises the level of the red blood cells which carry oxygen, and this usually causes an energy increase.  The nurse administering the transfusion will monitor you carefully for complications. HOME CARE INSTRUCTIONS  No special instructions are needed after a transfusion. You may find your energy is better. Speak with your caregiver about any limitations on activity for underlying diseases you may have. SEEK MEDICAL CARE IF:   Your condition is not improving after your transfusion.  You develop redness or irritation at the intravenous (IV) site. SEEK IMMEDIATE MEDICAL CARE IF:  Any of the following symptoms occur  over the next 12 hours:  Shaking chills.  You have a temperature by mouth above 102 F (38.9 C), not controlled by medicine.  Chest, back, or muscle pain.  People around you feel you are not acting correctly or are confused.  Shortness of breath or difficulty breathing.  Dizziness and fainting.  You get a rash or develop hives.  You have a decrease in urine output.  Your urine turns a dark color or changes to pink, red, or brown. Any of the following  symptoms occur over the next 10 days:  You have a temperature by mouth above 102 F (38.9 C), not controlled by medicine.  Shortness of breath.  Weakness after normal activity.  The white part of the eye turns yellow (jaundice).  You have a decrease in the amount of urine or are urinating less often.  Your urine turns a dark color or changes to pink, red, or brown. Document Released: 01/03/2000 Document Revised: 03/30/2011 Document Reviewed: 08/22/2007 [facility] Patient Information 2014 [place], [place].  _______________________________________________________________________

## 2018-12-19 NOTE — Telephone Encounter (Signed)

## 2018-12-19 NOTE — Progress Notes (Signed)
PCP - [name] Cardiologist - Buford Dresser clearance note in epic 11-21-18 Kathyrn Drown NP-C telephone note LOV oncology 12-06-18 epic  Chest x-ray -  EKG - 08-10-18 epic Stress Test -  ECHO - 08-01-18 epic Cardiac Cath - 12-2017 epic CT chest abd/ pelvis 12-09-18 epic CBC and CMP 12-21-18 epic CTA neck 12-20-18 epic  Sleep Study -  CPAP -   Fasting Blood Sugar -  Checks Blood Sugar _____ times a day  Blood Thinner Instructions:elequis and asa  Aspirin Instructions: Last Dose:  Anesthesia review: 4 vessel CABG,s/p AVR, Right Carotid endarectomy  All 12-2017  polycythemia, PAD , WBC 37.6  Patient denies shortness of breath, fever, cough and chest pain at PAT appointment  NONE   Patient verbalized understanding of instructions that were given to them at the PAT appointment. Patient was also instructed that they will need to review over the PAT instructions again at home before surgery.

## 2018-12-20 ENCOUNTER — Encounter: Payer: Self-pay | Admitting: Vascular Surgery

## 2018-12-20 ENCOUNTER — Other Ambulatory Visit: Payer: Self-pay

## 2018-12-20 ENCOUNTER — Ambulatory Visit (HOSPITAL_COMMUNITY)
Admission: RE | Admit: 2018-12-20 | Discharge: 2018-12-20 | Disposition: A | Discharge status: Home / Self Care | Payer: BC Managed Care – PPO | Source: Ambulatory Visit | Attending: Vascular Surgery | Admitting: Vascular Surgery

## 2018-12-20 ENCOUNTER — Other Ambulatory Visit: Payer: Self-pay | Admitting: Vascular Surgery

## 2018-12-20 ENCOUNTER — Other Ambulatory Visit (HOSPITAL_COMMUNITY)
Admission: RE | Admit: 2018-12-20 | Discharge: 2018-12-20 | Disposition: A | Discharge status: Home / Self Care | Payer: BC Managed Care – PPO | Source: Ambulatory Visit | Attending: Orthopedic Surgery | Admitting: Orthopedic Surgery

## 2018-12-20 ENCOUNTER — Inpatient Hospital Stay: Payer: BC Managed Care – PPO

## 2018-12-20 ENCOUNTER — Ambulatory Visit (INDEPENDENT_AMBULATORY_CARE_PROVIDER_SITE_OTHER): Payer: BC Managed Care – PPO | Admitting: Vascular Surgery

## 2018-12-20 ENCOUNTER — Ambulatory Visit (HOSPITAL_COMMUNITY)
Admission: RE | Admit: 2018-12-20 | Discharge: 2018-12-20 | Disposition: A | Discharge status: Home / Self Care | Payer: BC Managed Care – PPO | Source: Ambulatory Visit | Attending: Family | Admitting: Family

## 2018-12-20 VITALS — BP 146/81 | HR 90 | Temp 97.3°F | Resp 18 | Ht 68.0 in | Wt 197.0 lb

## 2018-12-20 DIAGNOSIS — I6523 Occlusion and stenosis of bilateral carotid arteries: Secondary | ICD-10-CM

## 2018-12-20 DIAGNOSIS — I6521 Occlusion and stenosis of right carotid artery: Secondary | ICD-10-CM | POA: Diagnosis not present

## 2018-12-20 DIAGNOSIS — Z20828 Contact with and (suspected) exposure to other viral communicable diseases: Secondary | ICD-10-CM | POA: Diagnosis not present

## 2018-12-20 MED ORDER — IOHEXOL 350 MG/ML SOLN
75.0000 mL | Freq: Once | INTRAVENOUS | Status: AC | PRN
  Administered 2018-12-20: 75 mL via INTRAVENOUS

## 2018-12-20 NOTE — Progress Notes (Signed)
Patient name: Justin [name] MRN: [id number] DOB: April 22, 1955 Sex: male  REASON FOR VISIT: 70-month follow-up after right carotid endarterectomy  HPI: Justin [name] is a 63 y.o. male that presents for 11-month follow-up after right carotid endarterectomy.  Patient was previously under the care of Dr. Lucianne Lei trigt and during cardiac work-up was found to have high-grade right ICA stenosis with contralateral occlusion.  Ultimately underwent coronary artery bypass grafting x4 with aortic valve replacement on 01/03/2018 and had a right carotid endarterectomy at the same time by myself.  He did well during his postoperative course with no major issues. On follow-up today reports no issues over the last 9 months.  No vision loss in eye.  No weakness or numbness in arm or leg.  No history of TIA or otherwise stroke.  He remains on Eliquis and aspirin.  He has since taken early retirement.  Past Medical History:  Diagnosis Date  . Acute meniscal tear of knee LEFT  . Aortic stenosis 12/01/2017   NONRHEUMATIC, AORTIC VALVE CALCIFICATIONS, MILD TO MODERATE REGURG, MILD TO MODERATE CALCIFIED ANNULUS per ECHO 10/25/17 @ [facility]  . Arthritis   . Atrial fibrillation ([place]) 11/12/2017   AT O/V WITH PCP  . DM (diabetes mellitus) ([place])   . Heart murmur MILD-- ASYMPTOMATIC  . Hyperlipidemia   . Hypertension   . Left knee pain   . PAD (peripheral artery disease) ([facility])    left leg claudication    Past Surgical History:  Procedure Laterality Date  . AORTIC VALVE REPLACEMENT N/A 01/03/2018   Procedure: AORTIC VALVE REPLACEMENT (AVR) using 78mm Magna Ease Bioprosthesis Aortic Valve;  Surgeon: Ivin Poot, MD;  Location: [place];  Service: Open Heart Surgery;  Laterality: N/A;  . APPENDECTOMY  1998  . BIOPSY  08/11/2018   Procedure: BIOPSY;  Surgeon: Mauri Pole, MD;  Location: [place] [facility];  Service: Endoscopy;;  . CATARACT EXTRACTION W/ INTRAOCULAR LENS  IMPLANT, BILATERAL  1998/   2000  . CERVICAL FUSION  1985   C4 - 5  . CORONARY ARTERY BYPASS GRAFT N/A 01/03/2018   Procedure: CORONARY ARTERY BYPASS GRAFTING (CABG) x 4 (LIMA to LAD, SVG to DIAGONAL, SVG to RAMUS INTERMEDIATE, and SVG to PDA), USING LEFT INFTERNAL MAMMARY ARTERY AND;  Surgeon: Prescott Gum, Collier Salina, MD;  Location: [place];  Service: Open Heart Surgery;  Laterality: N/A;  . ENDARTERECTOMY Right 01/03/2018   Procedure: ENDARTERECTOMY CAROTID;  Surgeon: Marty Heck, MD;  Location: [facility] OR;  Service: Vascular;  Laterality: Right;  . ESOPHAGOGASTRODUODENOSCOPY (EGD) WITH PROPOFOL N/A 08/11/2018   Procedure: ESOPHAGOGASTRODUODENOSCOPY (EGD) WITH PROPOFOL;  Surgeon: Mauri Pole, MD;  Location: [place];  Service: Endoscopy;  Laterality: N/A;  . KNEE ARTHROSCOPY W/ MENISCECTOMY  1991   LEFT KNEE  . NASAL SINUS SURGERY  1982  . RIGHT/LEFT HEART CATH AND CORONARY ANGIOGRAPHY N/A 12/24/2017   Procedure: RIGHT/LEFT HEART CATH AND CORONARY ANGIOGRAPHY;  Surgeon: Jolaine Artist, MD;  Location: [place] [facility];  Service: Cardiovascular;  Laterality: N/A;  . ROTATOR CUFF REPAIR  09-04-2005   LEFT SHOULDER  . TEE WITHOUT CARDIOVERSION N/A 12/24/2017   Procedure: TRANSESOPHAGEAL ECHOCARDIOGRAM (TEE);  Surgeon: Jolaine Artist, MD;  Location[facility];  Service: Cardiovascular;  Laterality: N/A;  . TEE WITHOUT CARDIOVERSION N/A 01/03/2018   Procedure: TRANSESOPHAGEAL ECHOCARDIOGRAM (TEE);  Surgeon: Prescott Gum, Collier Salina, MD;  Location: [place];  Service: Open Heart Surgery;  Laterality: N/A;  . TONSILLECTOMY  AGE 26  Family History  Adopted: Yes  Problem Relation Age of Onset  . Bladder Cancer Father        adopted father    SOCIAL HISTORY: Social History   Tobacco Use  . Smoking status: Former Smoker    Packs/day: 0.25    Years: 23.00    Pack years: 5.75    Types: Cigars    Quit date: 01/02/2018    Years since quitting: 0.9  . Smokeless tobacco: Never Used  . Tobacco comment: quit  cigs in 2011 and smokes cigars daily- from 3-10 cigars-09/27/13  Substance Use Topics  . Alcohol use: Not Currently    Alcohol/week: 0.0 standard drinks    Comment: occasional    Allergies  Allergen Reactions  . Codeine Swelling    Tolerates hydrocodone, tramadol, and oxycodone     Current Outpatient Medications  Medication Sig Dispense Refill  . acetaminophen (TYLENOL) 650 MG CR tablet Take 1,300 mg by mouth every 8 (eight) hours as needed for pain.    . ASPERCREME LIDOCAINE EX Apply 1 application topically daily as needed (pain).    . Cholecalciferol 125 MCG (5000 UT) capsule Take 5,000 Units by mouth daily.     . cyclobenzaprine (FLEXERIL) 10 MG tablet Take 10 mg by mouth 3 (three) times daily.     . ferrous sulfate 325 (65 FE) MG tablet Take 1 tablet (325 mg total) by mouth 2 (two) times daily with a meal. 60 tablet 0  . fluticasone (FLONASE) 50 MCG/ACT nasal spray Place 2 sprays into both nostrils daily.    . furosemide (LASIX) 20 MG tablet Take 20 mg by mouth daily as needed for fluid.     . Homeopathic Products (THERAWORX RELIEF EX) Apply 1 application topically daily as needed (pain).    Marland Kitchen HYDROcodone-acetaminophen (NORCO/VICODIN) 5-325 MG tablet Take 1 tablet by mouth every 4 (four) hours as needed for moderate pain.     . hydroxyurea (HYDREA) 500 MG capsule Take 500 mg by mouth See admin instructions. Alternate taking 500 mg in the morning on one day then take 500 mg twice on the next day.    . Lidocaine (BLUE-EMU PAIN RELIEF DRY EX) Apply 1 application topically daily as needed (pain).    Marland Kitchen loratadine (CLARITIN) 10 MG tablet Take 10 mg by mouth daily.    . metFORMIN (GLUCOPHAGE-XR) 500 MG 24 hr tablet Take 500 mg by mouth daily.     . metoprolol succinate (TOPROL XL) 25 MG 24 hr tablet Take 0.5 tablets (12.5 mg total) by mouth daily. (Patient taking differently: Take 25 mg by mouth daily. ) 30 tablet 0  . nefazodone (SERZONE) 100 MG tablet Take 100 mg by mouth 2 (two) times  daily.    . pantoprazole (PROTONIX) 40 MG tablet Take 1 tablet (40 mg total) by mouth daily. (Patient taking differently: Take 40 mg by mouth every evening. ) 90 tablet 3  . pregabalin (LYRICA) 50 MG capsule Take 50 mg by mouth 3 (three) times daily.    . rosuvastatin (CRESTOR) 20 MG tablet Take 1 tablet (20 mg total) by mouth daily. (Patient taking differently: Take 20 mg by mouth every evening. ) 90 tablet 3  . zolpidem (AMBIEN) 10 MG tablet Take 10 mg by mouth at bedtime.     Marland Kitchen amoxicillin (AMOXIL) 500 MG capsule Take 1 capsule (500 mg total) by mouth once as needed for up to 4 doses. Take four 500 mg capsules by mouth 1 hour before dental visits (Patient  not taking: Reported on 12/20/2018) 4 capsule 12  . apixaban (ELIQUIS) 5 MG TABS tablet Take 1 tablet (5 mg total) by mouth 2 (two) times daily. (Patient not taking: Reported on 12/20/2018) 60 tablet   . aspirin 81 MG tablet Take 81 mg by mouth daily.     . Carboxymethylcellulose Sodium (REFRESH TEARS OP) Place 1-2 drops into both eyes daily as needed (dry eyes).    . Multiple Vitamins-Minerals (CENTRUM ADULTS) TABS Take 1 capsule by mouth daily.    . potassium chloride (MICRO-K) 10 MEQ CR capsule Take 10 mEq by mouth daily as needed (take when taking lasix).     No current facility-administered medications for this visit.     REVIEW OF SYSTEMS:  [X]  denotes positive finding, [ ]  denotes negative finding Cardiac  Comments:  Chest pain or chest pressure:    Shortness of breath upon exertion:    Short of breath when lying flat:    Irregular heart rhythm:        Vascular    Pain in calf, thigh, or hip brought on by ambulation:    Pain in feet at night that wakes you up from your sleep:     Blood clot in your veins:    Leg swelling:         Pulmonary    Oxygen at home:    Productive cough:     Wheezing:         Neurologic    Sudden weakness in arms or legs:     Sudden numbness in arms or legs:     Sudden onset of difficulty  speaking or slurred speech:    Temporary loss of vision in one eye:     Problems with dizziness:         Gastrointestinal    Blood in stool:     Vomited blood:         Genitourinary    Burning when urinating:     Blood in urine:        Psychiatric    Major depression:         Hematologic    Bleeding problems:    Problems with blood clotting too easily:        Skin    Rashes or ulcers:        Constitutional    Fever or chills:      PHYSICAL EXAM: Vitals:   12/20/18 1033 12/20/18 1037  BP: 137/82 (!) 146/81  Pulse: 86 90  Resp: 18   Temp: (!) 97.3 F (36.3 C)   TempSrc: Temporal   SpO2: 95%   Weight: 197 lb (89.4 kg)   Height: 5\' 8"  (1.727 m)     GENERAL: The patient is a well-nourished male, in no acute distress. The vital signs are documented above. CARDIAC: There is a regular rate and rhythm.  VASCULAR:  Right neck incision well-healed PULMONARY: There is good air exchange bilaterally without wheezing or rales. ABDOMEN: Soft and non-tender with normal pitched bowel sounds.  MUSCULOSKELETAL: There are no major deformities or cyanosis. NEUROLOGIC: No focal weakness or paresthesias are detected.  Renal nerves II through XII grossly intact. SKIN: There are no ulcers or rashes noted. PSYCHIATRIC: The patient has a normal affect.  DATA:   Reviewed his carotid duplex today and his right carotid endarterectomy appears widely patent.  There was note of a small intimal flap in the common carotid artery but this is very tiny with no mobility.  There was documented flow in the left ICA on today's duplex although the waveforms are severely diminished.  Assessment/Plan:  64 year old male presents for 67-month follow-up after right carotid endarterectomy for asymptomatic high-grade disease at the same time as CABG x4/aortic valve replacement last year.  He has recovered without any major issues.  The right carotid endarterectomy looks widely patent on duplex today.  The  one question raised in his clinic visit today is that last years carotid duplex suggested his left ICA was occluded prior to his operation.  On today's duplex there does appear to be flow in the left ICA and ECA although fairly diminished waveforms suggesting more proximal disease.  I have recommended CTA neck to further evaluate given that likely proximal common carotid disease is leading to underestimation of his bifurcation disease.  We will schedule CTA for next available date and I will contact him with the results.  Otherwise, I will plan to see him again in one year with another carotid duplex for ongoing surveillance.     Marty Heck, MD [facility] Office: [phone] Pager: [phone]

## 2018-12-21 ENCOUNTER — Other Ambulatory Visit: Payer: Self-pay

## 2018-12-21 ENCOUNTER — Encounter (HOSPITAL_COMMUNITY): Payer: Self-pay

## 2018-12-21 ENCOUNTER — Inpatient Hospital Stay: Payer: BC Managed Care – PPO | Attending: Hematology and Oncology

## 2018-12-21 ENCOUNTER — Encounter (HOSPITAL_COMMUNITY)
Admission: RE | Admit: 2018-12-21 | Discharge: 2018-12-21 | Disposition: A | Discharge status: Home / Self Care | Payer: BC Managed Care – PPO | Source: Ambulatory Visit | Attending: Orthopedic Surgery | Admitting: Orthopedic Surgery

## 2018-12-21 ENCOUNTER — Telehealth: Payer: Self-pay | Admitting: *Deleted

## 2018-12-21 DIAGNOSIS — D649 Anemia, unspecified: Secondary | ICD-10-CM | POA: Insufficient documentation

## 2018-12-21 DIAGNOSIS — D45 Polycythemia vera: Secondary | ICD-10-CM

## 2018-12-21 DIAGNOSIS — I4891 Unspecified atrial fibrillation: Secondary | ICD-10-CM | POA: Diagnosis not present

## 2018-12-21 DIAGNOSIS — Z01812 Encounter for preprocedural laboratory examination: Secondary | ICD-10-CM | POA: Insufficient documentation

## 2018-12-21 DIAGNOSIS — E785 Hyperlipidemia, unspecified: Secondary | ICD-10-CM | POA: Diagnosis not present

## 2018-12-21 DIAGNOSIS — Z87891 Personal history of nicotine dependence: Secondary | ICD-10-CM | POA: Insufficient documentation

## 2018-12-21 DIAGNOSIS — M25551 Pain in right hip: Secondary | ICD-10-CM | POA: Insufficient documentation

## 2018-12-21 DIAGNOSIS — D75839 Thrombocytosis, unspecified: Secondary | ICD-10-CM

## 2018-12-21 DIAGNOSIS — Z7901 Long term (current) use of anticoagulants: Secondary | ICD-10-CM | POA: Diagnosis not present

## 2018-12-21 DIAGNOSIS — Z7982 Long term (current) use of aspirin: Secondary | ICD-10-CM | POA: Insufficient documentation

## 2018-12-21 DIAGNOSIS — D473 Essential (hemorrhagic) thrombocythemia: Secondary | ICD-10-CM | POA: Diagnosis not present

## 2018-12-21 DIAGNOSIS — I10 Essential (primary) hypertension: Secondary | ICD-10-CM | POA: Diagnosis not present

## 2018-12-21 DIAGNOSIS — I739 Peripheral vascular disease, unspecified: Secondary | ICD-10-CM | POA: Diagnosis not present

## 2018-12-21 DIAGNOSIS — Z7984 Long term (current) use of oral hypoglycemic drugs: Secondary | ICD-10-CM | POA: Insufficient documentation

## 2018-12-21 DIAGNOSIS — E118 Type 2 diabetes mellitus with unspecified complications: Secondary | ICD-10-CM | POA: Insufficient documentation

## 2018-12-21 DIAGNOSIS — Z79899 Other long term (current) drug therapy: Secondary | ICD-10-CM | POA: Insufficient documentation

## 2018-12-21 DIAGNOSIS — I2510 Atherosclerotic heart disease of native coronary artery without angina pectoris: Secondary | ICD-10-CM | POA: Diagnosis not present

## 2018-12-21 DIAGNOSIS — M1611 Unilateral primary osteoarthritis, right hip: Secondary | ICD-10-CM | POA: Diagnosis not present

## 2018-12-21 HISTORY — DX: Malignant (primary) neoplasm, unspecified: C80.1

## 2018-12-21 HISTORY — DX: Anemia, unspecified: D64.9

## 2018-12-21 HISTORY — DX: Atherosclerotic heart disease of native coronary artery without angina pectoris: I25.10

## 2018-12-21 HISTORY — DX: Personal history of urinary calculi: Z87.442

## 2018-12-21 LAB — CMP (CANCER CENTER ONLY)
ALT: 9 U/L (ref 0–44)
AST: 17 U/L (ref 15–41)
Albumin: 4.5 g/dL (ref 3.5–5.0)
Alkaline Phosphatase: 122 U/L (ref 38–126)
Anion gap: 9 (ref 5–15)
BUN: 11 mg/dL (ref 8–23)
CO2: 29 mmol/L (ref 22–32)
Calcium: 10.3 mg/dL (ref 8.9–10.3)
Chloride: 97 mmol/L — ABNORMAL LOW (ref 98–111)
Creatinine: 0.82 mg/dL (ref 0.61–1.24)
GFR, Est AFR Am: >60 mL/min (ref >60)
GFR, Estimated: >60 mL/min (ref >60)
Glucose, Bld: 109 mg/dL — ABNORMAL HIGH (ref 70–99)
Potassium: 4.5 mmol/L (ref 3.5–5.1)
Sodium: 135 mmol/L (ref 135–145)
Total Bilirubin: 0.6 mg/dL (ref 0.3–1.2)
Total Protein: 8.4 g/dL — ABNORMAL HIGH (ref 6.5–8.1)

## 2018-12-21 LAB — CBC WITH DIFFERENTIAL (CANCER CENTER ONLY)
Abs Immature Granulocytes: 2.79 10*3/uL — ABNORMAL HIGH (ref 0.00–0.07)
Basophils Absolute: 0.5 10*3/uL — ABNORMAL HIGH (ref 0.0–0.1)
Basophils Relative: 1 %
Eosinophils Absolute: 0.2 10*3/uL (ref 0.0–0.5)
Eosinophils Relative: 1 %
HCT: 48.7 % (ref 39.0–52.0)
Hemoglobin: 16.2 g/dL (ref 13.0–17.0)
Immature Granulocytes: 7 %
Lymphocytes Relative: 5 %
Lymphs Abs: 1.7 10*3/uL (ref 0.7–4.0)
MCH: 32.5 pg (ref 26.0–34.0)
MCHC: 33.3 g/dL (ref 30.0–36.0)
MCV: 97.6 fL (ref 80.0–100.0)
Monocytes Absolute: 1.9 10*3/uL — ABNORMAL HIGH (ref 0.1–1.0)
Monocytes Relative: 5 %
Neutro Abs: 30.6 10*3/uL — ABNORMAL HIGH (ref 1.7–7.7)
Neutrophils Relative %: 81 %
Platelet Count: 274 10*3/uL (ref 150–400)
RBC: 4.99 MIL/uL (ref 4.22–5.81)
WBC Count: 37.6 10*3/uL — ABNORMAL HIGH (ref 4.0–10.5)
nRBC: 0.1 % (ref 0.0–0.2)

## 2018-12-21 LAB — SURGICAL PCR SCREEN
MRSA, PCR: NEGATIVE
Staphylococcus aureus: NEGATIVE

## 2018-12-21 LAB — ABO/RH: ABO/RH(D): A POS

## 2018-12-21 LAB — GLUCOSE, CAPILLARY: Glucose-Capillary: 140 mg/dL — ABNORMAL HIGH (ref 70–99)

## 2018-12-21 LAB — HEMOGLOBIN A1C
Hgb A1c MFr Bld: 6 % — ABNORMAL HIGH (ref 4.8–5.6)
Mean Plasma Glucose: 125.5 mg/dL

## 2018-12-21 NOTE — Telephone Encounter (Signed)
As noted below by Dr. Maylon Peppers, I informed him that his WBC is improving, so continue Hydrea as prescribed. He verbalized understanding.

## 2018-12-21 NOTE — Telephone Encounter (Signed)
-----   Message from Tish Men, MD sent at 12/21/2018 12:36 PM EST ----- Delrae Sawyers,  Can you let Mr. Blatz know that his WBC is improving, so we will continue Hydrea as prescribed?Thanks.  [place]  ----- Message ----- From: Buel Ream, Lab In [place] Sent: 12/21/2018  12:04 PM EST To: Tish Men, MD

## 2018-12-22 LAB — NOVEL CORONAVIRUS, NAA (HOSP ORDER, SEND-OUT TO REF LAB; TAT 18-24 HRS): SARS-CoV-2, NAA: NOT DETECTED

## 2018-12-22 LAB — LACTATE DEHYDROGENASE: LDH: 441 U/L — ABNORMAL HIGH (ref 98–192)

## 2018-12-22 NOTE — Progress Notes (Addendum)
Anesthesia Chart Review   Case: [id number] Date/Time: 12/23/18 1315   Procedure: TOTAL HIP ARTHROPLASTY ANTERIOR APPROACH (Right Hip) - 70 mins   Anesthesia type: Spinal   Pre-op diagnosis: Right hip osteoarthritis   Location: [place] [facility]   Surgeon: Paralee Cancel, MD      DISCUSSION:63 y.o. former smoker (5.75 pack years, quit 01/02/18) with h/o HTN, DM II, CAD (CABG12/2019), A-fib, AS s/p valve replacement 12/2017, s/p right carotid endarterectomy 12/2017, on Eliquis, polycythemia, right hip OA scheduled for above procedure 12/23/2018 with Dr. Paralee Cancel.   9 months s/p CABG x4, aortic valve replacement, and right carotid endarterectomy.    Last seen by vascular surgeon, Dr. Carlis Abbott, 12/20/2018.  Overall stable at this visit with 1 year follow up recommended.  A CT angio was ordered to evaluate left carotid which showed moderate to marked stenosis with flow limitation.  Vascular US 12/20/2018 states Velocities in the left ICA are consistent with a 1-39% stenosis.  The ECA appears <50% stenosed.   Cleared by cardiology 11/21/2018.  Per Almyra Deforest, PA-C, "Rafid Watt Krupp was last seen on 09/20/2018 by Dr. Harrell Gave.  Since that day, Nikoloz Kurowski has done well without chest pain or significant dyspnea. He is able to walk 1/2 mile with his dog. He is unable to do strenuous activity due to hip issue  Therefore, based on ACC/AHA guidelines, the patient would be at acceptable risk for the planned procedure without further cardiovascular testing."    Per Dr. Harrell Gave, "He is within one year of CABG but is overall doing well. He does have a hematologic condition (polycythemia), and they should be aware of this as well if aspirin is held. Overall short term risk of holding aspirin is low, but it is not zero risk for him. If they feel that the benefit of spinal anesthesia over general anesthesia is significant, then he can hold aspiring for 7 days prior, but it should be restarted as soon  as possible postoperatively. He should also be monitored on telemetry post op, and any chest pain postoperatively should be investigated."  Advised to hold Eliquis 3 days prior to procedure.   Anticipate pt can proceed with planned procedure barring acute status change.   VS: BP 124/81   Pulse 86   Temp 36.6 C (Oral)   Resp 16   Ht 5\' 8"  (1.727 m)   Wt 89.8 kg   SpO2 95%   BMI 30.11 kg/m   PROVIDERS: [name], MD is PCP   Gerlene Fee, MD is Cardiologist    Monica Martinez, MD is Vascular Surgeon   Tish Men, MD with Hematology/Oncology last seen 12/06/2018, follows thrombocytopenia LABS: Labs reviewed: Acceptable for surgery. (all labs ordered are listed, but only abnormal results are displayed)  Labs Reviewed  HEMOGLOBIN A1C - Abnormal; Notable for the following components:      Result Value   Hgb A1c MFr Bld 6.0 (*)    All other components within normal limits  GLUCOSE, CAPILLARY - Abnormal; Notable for the following components:   Glucose-Capillary 140 (*)    All other components within normal limits  SURGICAL PCR SCREEN  TYPE AND SCREEN  ABO/RH     IMAGES: CT Angio Neck 12/20/2018 IMPRESSION: Postoperative changes of right carotid endarterectomy. Right ICA is patent without significant stenosis. Occlusion of the proximal right ECA.  Moderate to marked stenosis at the origin of the left CCA and superimposed calcified plaque at the left ICA origin. There is  flow limitation with initially significantly diminished and subsequently lost opacification of the cervical ICA and included proximal intracranial ICA.  EKG: 08/10/2018 Rate 113 bpm  Atrial fibrillation with rapid ventricular response Possible Inferior infarct , age undetermined Abnormal ECG Since last tracing atrial fibrillation persists, no significant change  CV: Echo 08/01/2018 IMPRESSIONS   1. The left ventricle has normal systolic function with an ejection fraction of  60-65%. The cavity size was normal. There is mildly increased left ventricular wall thickness. Left ventricular diastolic Doppler parameters are indeterminate.  2. The right ventricle has normal systolic function.  3. Left atrial size was moderately dilated.  4. A 65mm an [employer] valve is present in the aortic position. Procedure Date: 01/03/18.  5. Aortic valve prosthesis appears to open well. Aortic Valve: The aortic valve has been repaired/replaced Aortic valve regurgitation was not visualized by color flow Doppler. A 69mm [employer] Magna Ease valve is present in the aortic position. Procedure Date: 01/03/18. Aortic valve prosthesis appears to  open well. Past Medical History:  Diagnosis Date  . Acute meniscal tear of knee LEFT  . Anemia   . Aortic stenosis 12/01/2017   NONRHEUMATIC, AORTIC VALVE CALCIFICATIONS, MILD TO MODERATE REGURG, MILD TO MODERATE CALCIFIED ANNULUS per ECHO 10/25/17 @ [facility]  . Arthritis   . Atrial fibrillation ([place]) 11/12/2017   AT O/V WITH PCP  . Cancer ([place])    skin right arm  . Coronary artery disease   . DM (diabetes mellitus) ([place])   . Heart murmur MILD-- ASYMPTOMATIC  . History of kidney stones   . Hyperlipidemia   . Hypertension   . Left knee pain   . PAD (peripheral artery disease) ([facility])    left leg claudication    Past Surgical History:  Procedure Laterality Date  . AORTIC VALVE REPLACEMENT N/A 01/03/2018   Procedure: AORTIC VALVE REPLACEMENT (AVR) using 20mm Magna Ease Bioprosthesis Aortic Valve;  Surgeon: Ivin Poot, MD;  Location: [place];  Service: Open Heart Surgery;  Laterality: N/A;  . APPENDECTOMY  1998  . BIOPSY  08/11/2018   Procedure: BIOPSY;  Surgeon: Mauri Pole, MD;  Location: [place] [facility];  Service: Endoscopy;;  . CATARACT EXTRACTION W/ INTRAOCULAR LENS  IMPLANT, BILATERAL  1998/  2000  . CERVICAL FUSION  1985   C4 - 5  . CORONARY ARTERY BYPASS GRAFT N/A 01/03/2018   Procedure: CORONARY ARTERY  BYPASS GRAFTING (CABG) x 4 (LIMA to LAD, SVG to DIAGONAL, SVG to RAMUS INTERMEDIATE, and SVG to PDA), USING LEFT INFTERNAL MAMMARY ARTERY AND;  Surgeon: Prescott Gum, Collier Salina, MD;  Location: [place];  Service: Open Heart Surgery;  Laterality: N/A;  . ENDARTERECTOMY Right 01/03/2018   Procedure: ENDARTERECTOMY CAROTID;  Surgeon: Marty Heck, MD;  Location: [facility];  Service: Vascular;  Laterality: Right;  . ESOPHAGOGASTRODUODENOSCOPY (EGD) WITH PROPOFOL N/A 08/11/2018   Procedure: ESOPHAGOGASTRODUODENOSCOPY (EGD) WITH PROPOFOL;  Surgeon: Mauri Pole, MD;  Location: [place];  Service: Endoscopy;  Laterality: N/A;  . KNEE ARTHROSCOPY W/ MENISCECTOMY  1991  1987   LEFT KNEE   x 2  . NASAL SINUS SURGERY  1982  . RIGHT/LEFT HEART CATH AND CORONARY ANGIOGRAPHY N/A 12/24/2017   Procedure: RIGHT/LEFT HEART CATH AND CORONARY ANGIOGRAPHY;  Surgeon: Jolaine Artist, MD;  Location: [place] [facility];  Service: Cardiovascular;  Laterality: N/A;  . ROTATOR CUFF REPAIR  09-04-2005   LEFT SHOULDER  . TEE WITHOUT CARDIOVERSION N/A 12/24/2017   Procedure: TRANSESOPHAGEAL ECHOCARDIOGRAM (  TEE);  Surgeon: Jolaine Artist, MD;  Location: [facility] END[facility]COPY;  Service: Cardiovascular;  Laterality: N/A;  . TEE WITHOUT CARDIOVERSION N/A 01/03/2018   Procedure: TRANSESOPHAGEAL ECHOCARDIOGRAM (TEE);  Surgeon: Prescott Gum, Collier Salina, MD;  Location: [place];  Service: Open Heart Surgery;  Laterality: N/A;  . TONSILLECTOMY  AGE 89    MEDICATIONS: . acetaminophen (TYLENOL) 650 MG CR tablet  . amoxicillin (AMOXIL) 500 MG capsule  . apixaban (ELIQUIS) 5 MG TABS tablet  . ASPERCREME LIDOCAINE EX  . aspirin 81 MG tablet  . Carboxymethylcellulose Sodium (REFRESH TEARS OP)  . Cholecalciferol 125 MCG (5000 UT) capsule  . cyclobenzaprine (FLEXERIL) 10 MG tablet  . ferrous sulfate 325 (65 FE) MG tablet  . fluticasone (FLONASE) 50 MCG/ACT nasal spray  . furosemide (LASIX) 20 MG tablet  . Homeopathic Products ([place])  . HYDROcodone-acetaminophen (NORCO/VICODIN) 5-325 MG tablet  . hydroxyurea (HYDREA) 500 MG capsule  . Lidocaine (BLUE-EMU PAIN RELIEF DRY EX)  . loratadine (CLARITIN) 10 MG tablet  . metFORMIN (GLUCOPHAGE-XR) 500 MG 24 hr tablet  . metoprolol succinate (TOPROL XL) 25 MG 24 hr tablet  . Multiple Vitamins-Minerals (CENTRUM ADULTS) TABS  . nefazodone (SERZONE) 100 MG tablet  . pantoprazole (PROTONIX) 40 MG tablet  . potassium chloride (MICRO-K) 10 MEQ CR capsule  . pregabalin (LYRICA) 50 MG capsule  . rosuvastatin (CRESTOR) 20 MG tablet  . zolpidem (AMBIEN) 10 MG tablet   No current facility-administered medications for this encounter.     Maia Plan [facility] Pre-Surgical Testing [phone] 12/22/18  11:21 AM

## 2018-12-22 NOTE — Anesthesia Preprocedure Evaluation (Addendum)
Anesthesia Evaluation  Patient identified by MRN, date of birth, ID band Patient awake    Reviewed: Allergy & Precautions, NPO status , Patient's Chart, lab work & pertinent test results  History of Anesthesia Complications Negative for: history of anesthetic complications  Airway Mallampati: II  TM Distance: >3 FB Neck ROM: Full    Dental  (+) Edentulous Upper, Edentulous Lower   Pulmonary sleep apnea , Patient abstained from smoking., former smoker,    Pulmonary exam normal        Cardiovascular hypertension, Pt. on home beta blockers and Pt. on medications + CAD and + Peripheral Vascular Disease  Normal cardiovascular exam+ dysrhythmias Atrial Fibrillation   TTE 07/2018: EF 60-65%, moderate LAE, s/p aortic valve replacement     Neuro/Psych S/p cervical fusion negative psych ROS   GI/Hepatic Neg liver ROS, PUD, GERD  Medicated,  Endo/Other  diabetes, Type 2, Oral Hypoglycemic Agents  Renal/GU negative Renal ROS  negative genitourinary   Musculoskeletal  (+) Arthritis ,   Abdominal   Peds  Hematology negative hematology ROS (+)   Anesthesia Other Findings Day of surgery medications reviewed with patient.  Reproductive/Obstetrics negative OB ROS                           Anesthesia Physical Anesthesia Plan  ASA: III  Anesthesia Plan: Spinal   Post-op Pain Management:    Induction:   PONV Risk Score and Plan: 2 and Treatment may vary due to age or medical condition, Ondansetron, Midazolam, Dexamethasone and Propofol infusion  Airway Management Planned: Natural Airway and Simple Face Mask  Additional Equipment: None  Intra-op Plan:   Post-operative Plan:   Informed Consent: I have reviewed the patients History and Physical, chart, labs and discussed the procedure including the risks, benefits and alternatives for the proposed anesthesia with the patient or authorized  representative who has indicated his/her understanding and acceptance.       Plan Discussed with: CRNA  Anesthesia Plan Comments: (See PAT note 12/21/2018, Konrad Felix, PA-C)      Anesthesia Quick Evaluation

## 2018-12-23 ENCOUNTER — Inpatient Hospital Stay (HOSPITAL_COMMUNITY)
Admission: RE | Admit: 2018-12-23 | Discharge: 2018-12-24 | DRG: 470 | Disposition: A | Discharge status: Home / Self Care | Payer: BC Managed Care – PPO | Attending: Orthopedic Surgery | Admitting: Orthopedic Surgery

## 2018-12-23 ENCOUNTER — Encounter (HOSPITAL_COMMUNITY): Payer: Self-pay | Admitting: *Deleted

## 2018-12-23 ENCOUNTER — Other Ambulatory Visit: Payer: Self-pay

## 2018-12-23 ENCOUNTER — Inpatient Hospital Stay (HOSPITAL_COMMUNITY): Payer: BC Managed Care – PPO

## 2018-12-23 ENCOUNTER — Encounter (HOSPITAL_COMMUNITY)
Admission: RE | Disposition: A | Discharge status: Home / Self Care | Payer: Self-pay | Source: Home / Self Care | Attending: Orthopedic Surgery

## 2018-12-23 ENCOUNTER — Inpatient Hospital Stay (HOSPITAL_COMMUNITY): Payer: BC Managed Care – PPO | Admitting: Physician Assistant

## 2018-12-23 ENCOUNTER — Inpatient Hospital Stay (HOSPITAL_COMMUNITY): Payer: BC Managed Care – PPO | Admitting: Anesthesiology

## 2018-12-23 DIAGNOSIS — I2510 Atherosclerotic heart disease of native coronary artery without angina pectoris: Secondary | ICD-10-CM | POA: Diagnosis not present

## 2018-12-23 DIAGNOSIS — E785 Hyperlipidemia, unspecified: Secondary | ICD-10-CM | POA: Diagnosis not present

## 2018-12-23 DIAGNOSIS — Z79891 Long term (current) use of opiate analgesic: Secondary | ICD-10-CM | POA: Diagnosis not present

## 2018-12-23 DIAGNOSIS — Z7901 Long term (current) use of anticoagulants: Secondary | ICD-10-CM | POA: Diagnosis not present

## 2018-12-23 DIAGNOSIS — Z87891 Personal history of nicotine dependence: Secondary | ICD-10-CM

## 2018-12-23 DIAGNOSIS — E1151 Type 2 diabetes mellitus with diabetic peripheral angiopathy without gangrene: Secondary | ICD-10-CM | POA: Diagnosis not present

## 2018-12-23 DIAGNOSIS — Z791 Long term (current) use of non-steroidal anti-inflammatories (NSAID): Secondary | ICD-10-CM | POA: Diagnosis not present

## 2018-12-23 DIAGNOSIS — Z951 Presence of aortocoronary bypass graft: Secondary | ICD-10-CM

## 2018-12-23 DIAGNOSIS — Z952 Presence of prosthetic heart valve: Secondary | ICD-10-CM

## 2018-12-23 DIAGNOSIS — M1611 Unilateral primary osteoarthritis, right hip: Principal | ICD-10-CM | POA: Diagnosis present

## 2018-12-23 DIAGNOSIS — Z419 Encounter for procedure for purposes other than remedying health state, unspecified: Secondary | ICD-10-CM

## 2018-12-23 DIAGNOSIS — Z87442 Personal history of urinary calculi: Secondary | ICD-10-CM | POA: Diagnosis not present

## 2018-12-23 DIAGNOSIS — Z79899 Other long term (current) drug therapy: Secondary | ICD-10-CM | POA: Diagnosis not present

## 2018-12-23 DIAGNOSIS — Z885 Allergy status to narcotic agent status: Secondary | ICD-10-CM | POA: Diagnosis not present

## 2018-12-23 DIAGNOSIS — Z981 Arthrodesis status: Secondary | ICD-10-CM | POA: Diagnosis not present

## 2018-12-23 DIAGNOSIS — Z7984 Long term (current) use of oral hypoglycemic drugs: Secondary | ICD-10-CM

## 2018-12-23 DIAGNOSIS — Z7982 Long term (current) use of aspirin: Secondary | ICD-10-CM | POA: Diagnosis not present

## 2018-12-23 DIAGNOSIS — Z471 Aftercare following joint replacement surgery: Secondary | ICD-10-CM | POA: Diagnosis not present

## 2018-12-23 DIAGNOSIS — Z96641 Presence of right artificial hip joint: Secondary | ICD-10-CM | POA: Diagnosis not present

## 2018-12-23 DIAGNOSIS — E119 Type 2 diabetes mellitus without complications: Secondary | ICD-10-CM | POA: Diagnosis not present

## 2018-12-23 DIAGNOSIS — I10 Essential (primary) hypertension: Secondary | ICD-10-CM | POA: Diagnosis present

## 2018-12-23 DIAGNOSIS — Z96649 Presence of unspecified artificial hip joint: Secondary | ICD-10-CM

## 2018-12-23 HISTORY — PX: TOTAL HIP ARTHROPLASTY: SHX124

## 2018-12-23 LAB — GLUCOSE, CAPILLARY
Glucose-Capillary: 118 mg/dL — ABNORMAL HIGH (ref 70–99)
Glucose-Capillary: 101 mg/dL — ABNORMAL HIGH (ref 70–99)
Glucose-Capillary: 146 mg/dL — ABNORMAL HIGH (ref 70–99)
Glucose-Capillary: 231 mg/dL — ABNORMAL HIGH (ref 70–99)

## 2018-12-23 LAB — TYPE AND SCREEN
ABO/RH(D): A POS
Antibody Screen: NEGATIVE

## 2018-12-23 SURGERY — ARTHROPLASTY, HIP, TOTAL, ANTERIOR APPROACH
Anesthesia: Spinal | Site: Hip | Laterality: Right

## 2018-12-23 MED ORDER — PANTOPRAZOLE SODIUM 40 MG PO TBEC: 40.0000 mg | DELAYED_RELEASE_TABLET | Freq: Every evening | ORAL | Status: DC

## 2018-12-23 MED ORDER — KETOROLAC TROMETHAMINE 30 MG/ML IJ SOLN
INTRAMUSCULAR | Status: AC
  Filled 2018-12-23: qty 1

## 2018-12-23 MED ORDER — PHENYLEPHRINE HCL-NACL 10-0.9 MG/250ML-% IV SOLN
INTRAVENOUS | Status: AC
  Filled 2018-12-23: qty 250

## 2018-12-23 MED ORDER — STERILE WATER FOR IRRIGATION IR SOLN
Status: DC | PRN
  Administered 2018-12-23 (×2): 1000 mL

## 2018-12-23 MED ORDER — HYDROXYUREA 500 MG PO CAPS
500.0000 mg | ORAL_CAPSULE | Freq: Every day | ORAL | Status: DC
  Administered 2018-12-24: 11:00:00 500 mg via ORAL
  Filled 2018-12-23: qty 1

## 2018-12-23 MED ORDER — METFORMIN HCL ER 500 MG PO TB24
500.0000 mg | ORAL_TABLET | Freq: Every day | ORAL | Status: DC
  Administered 2018-12-24: 500 mg via ORAL
  Filled 2018-12-23: qty 1

## 2018-12-23 MED ORDER — TRANEXAMIC ACID-NACL 1000-0.7 MG/100ML-% IV SOLN
1000.0000 mg | Freq: Once | INTRAVENOUS | Status: AC
  Administered 2018-12-24: 01:00:00 1000 mg via INTRAVENOUS
  Filled 2018-12-23 (×2): qty 100

## 2018-12-23 MED ORDER — ACETAMINOPHEN 500 MG PO TABS: 1000.0000 mg | ORAL_TABLET | Freq: Once | ORAL | Status: DC

## 2018-12-23 MED ORDER — EPHEDRINE 5 MG/ML INJ
INTRAVENOUS | Status: AC
  Filled 2018-12-23: qty 10

## 2018-12-23 MED ORDER — PHENYLEPHRINE HCL-NACL 10-0.9 MG/250ML-% IV SOLN
INTRAVENOUS | Status: DC | PRN
  Administered 2018-12-23: 40 ug/min via INTRAVENOUS

## 2018-12-23 MED ORDER — DOCUSATE SODIUM 100 MG PO CAPS
100.0000 mg | ORAL_CAPSULE | Freq: Two times a day (BID) | ORAL | Status: DC
  Administered 2018-12-23 – 2018-12-24 (×2): 100 mg via ORAL
  Filled 2018-12-23 (×2): qty 1

## 2018-12-23 MED ORDER — HYDROCODONE-ACETAMINOPHEN 5-325 MG PO TABS
1.0000 | ORAL_TABLET | ORAL | Status: DC | PRN
  Administered 2018-12-23: 20:00:00 2 via ORAL
  Filled 2018-12-23: qty 2

## 2018-12-23 MED ORDER — DIPHENHYDRAMINE HCL 12.5 MG/5ML PO ELIX: 12.5000 mg | ORAL_SOLUTION | ORAL | Status: DC | PRN

## 2018-12-23 MED ORDER — DEXAMETHASONE SODIUM PHOSPHATE 10 MG/ML IJ SOLN
10.0000 mg | Freq: Once | INTRAMUSCULAR | Status: AC
  Administered 2018-12-24: 10 mg via INTRAVENOUS
  Filled 2018-12-23: qty 1

## 2018-12-23 MED ORDER — SODIUM CHLORIDE (PF) 0.9 % IJ SOLN
INTRAMUSCULAR | Status: AC
  Filled 2018-12-23: qty 50

## 2018-12-23 MED ORDER — NEFAZODONE HCL 100 MG PO TABS
100.0000 mg | ORAL_TABLET | Freq: Two times a day (BID) | ORAL | Status: DC
  Filled 2018-12-23: qty 1

## 2018-12-23 MED ORDER — TRANEXAMIC ACID-NACL 1000-0.7 MG/100ML-% IV SOLN
1000.0000 mg | INTRAVENOUS | Status: AC
  Administered 2018-12-23: 1000 mg via INTRAVENOUS
  Filled 2018-12-23: qty 100

## 2018-12-23 MED ORDER — ONDANSETRON HCL 4 MG/2ML IJ SOLN
INTRAMUSCULAR | Status: DC | PRN
  Administered 2018-12-23: 4 mg via INTRAVENOUS

## 2018-12-23 MED ORDER — METOCLOPRAMIDE HCL 5 MG PO TABS: 5.0000 mg | ORAL_TABLET | Freq: Three times a day (TID) | ORAL | Status: DC | PRN

## 2018-12-23 MED ORDER — POTASSIUM CHLORIDE CRYS ER 10 MEQ PO TBCR: 10.0000 meq | EXTENDED_RELEASE_TABLET | Freq: Every day | ORAL | Status: DC | PRN

## 2018-12-23 MED ORDER — METHOCARBAMOL 500 MG PO TABS
500.0000 mg | ORAL_TABLET | Freq: Four times a day (QID) | ORAL | Status: DC | PRN
  Administered 2018-12-23 – 2018-12-24 (×2): 500 mg via ORAL
  Filled 2018-12-23 (×2): qty 1

## 2018-12-23 MED ORDER — PROPOFOL 500 MG/50ML IV EMUL
INTRAVENOUS | Status: DC | PRN
  Administered 2018-12-23: 25 ug/kg/min via INTRAVENOUS

## 2018-12-23 MED ORDER — BUPIVACAINE IN DEXTROSE 0.75-8.25 % IT SOLN
INTRATHECAL | Status: DC | PRN
  Administered 2018-12-23: 2 mL via INTRATHECAL

## 2018-12-23 MED ORDER — PHENYLEPHRINE 40 MCG/ML (10ML) SYRINGE FOR IV PUSH (FOR BLOOD PRESSURE SUPPORT)
PREFILLED_SYRINGE | INTRAVENOUS | Status: AC
  Filled 2018-12-23: qty 10

## 2018-12-23 MED ORDER — DEXAMETHASONE SODIUM PHOSPHATE 10 MG/ML IJ SOLN
10.0000 mg | Freq: Once | INTRAMUSCULAR | Status: AC
  Administered 2018-12-23: 10 mg via INTRAVENOUS

## 2018-12-23 MED ORDER — OXYCODONE HCL 5 MG PO TABS: 5.0000 mg | ORAL_TABLET | Freq: Once | ORAL | Status: DC | PRN

## 2018-12-23 MED ORDER — LIDOCAINE 2% (20 MG/ML) 5 ML SYRINGE
INTRAMUSCULAR | Status: AC
  Filled 2018-12-23: qty 5

## 2018-12-23 MED ORDER — MIDAZOLAM HCL 2 MG/2ML IJ SOLN
INTRAMUSCULAR | Status: AC
  Filled 2018-12-23: qty 2

## 2018-12-23 MED ORDER — PROPOFOL 10 MG/ML IV BOLUS
INTRAVENOUS | Status: DC | PRN
  Administered 2018-12-23: 20 mg via INTRAVENOUS

## 2018-12-23 MED ORDER — FLUTICASONE PROPIONATE 50 MCG/ACT NA SUSP
2.0000 | Freq: Every day | NASAL | Status: DC
  Administered 2018-12-24: 11:00:00 2 via NASAL
  Filled 2018-12-23: qty 16

## 2018-12-23 MED ORDER — CEFAZOLIN SODIUM-DEXTROSE 2-4 GM/100ML-% IV SOLN
2.0000 g | INTRAVENOUS | Status: AC
  Administered 2018-12-23: 2 g via INTRAVENOUS
  Filled 2018-12-23: qty 100

## 2018-12-23 MED ORDER — BISACODYL 10 MG RE SUPP: 10.0000 mg | Freq: Every day | RECTAL | Status: DC | PRN

## 2018-12-23 MED ORDER — DEXAMETHASONE SODIUM PHOSPHATE 10 MG/ML IJ SOLN
INTRAMUSCULAR | Status: AC
  Filled 2018-12-23: qty 1

## 2018-12-23 MED ORDER — POLYETHYLENE GLYCOL 3350 17 G PO PACK
17.0000 g | PACK | Freq: Two times a day (BID) | ORAL | Status: DC
  Administered 2018-12-23 – 2018-12-24 (×2): 17 g via ORAL
  Filled 2018-12-23 (×2): qty 1

## 2018-12-23 MED ORDER — PHENOL 1.4 % MT LIQD
1.0000 | OROMUCOSAL | Status: DC | PRN
  Filled 2018-12-23: qty 177

## 2018-12-23 MED ORDER — LIDOCAINE 2% (20 MG/ML) 5 ML SYRINGE
INTRAMUSCULAR | Status: DC | PRN
  Administered 2018-12-23: 40 mg via INTRAVENOUS

## 2018-12-23 MED ORDER — HYDROCODONE-ACETAMINOPHEN 7.5-325 MG PO TABS
1.0000 | ORAL_TABLET | ORAL | Status: DC | PRN
  Administered 2018-12-24 (×4): 2 via ORAL
  Filled 2018-12-23 (×4): qty 2

## 2018-12-23 MED ORDER — METOPROLOL SUCCINATE ER 25 MG PO TB24
25.0000 mg | ORAL_TABLET | Freq: Every day | ORAL | Status: DC
  Administered 2018-12-24: 25 mg via ORAL
  Filled 2018-12-23: qty 1

## 2018-12-23 MED ORDER — ONDANSETRON HCL 4 MG PO TABS: 4.0000 mg | ORAL_TABLET | Freq: Four times a day (QID) | ORAL | Status: DC | PRN

## 2018-12-23 MED ORDER — BUPIVACAINE HCL (PF) 0.25 % IJ SOLN
INTRAMUSCULAR | Status: AC
  Filled 2018-12-23: qty 30

## 2018-12-23 MED ORDER — FENTANYL CITRATE (PF) 100 MCG/2ML IJ SOLN
INTRAMUSCULAR | Status: AC
  Filled 2018-12-23: qty 2

## 2018-12-23 MED ORDER — METHOCARBAMOL 500 MG IVPB - SIMPLE MED
500.0000 mg | Freq: Four times a day (QID) | INTRAVENOUS | Status: DC | PRN
  Filled 2018-12-23: qty 50

## 2018-12-23 MED ORDER — ONDANSETRON HCL 4 MG/2ML IJ SOLN
INTRAMUSCULAR | Status: AC
  Filled 2018-12-23: qty 2

## 2018-12-23 MED ORDER — FUROSEMIDE 20 MG PO TABS: 20.0000 mg | ORAL_TABLET | Freq: Every day | ORAL | Status: DC | PRN

## 2018-12-23 MED ORDER — PROMETHAZINE HCL 25 MG/ML IJ SOLN: 6.2500 mg | INTRAMUSCULAR | Status: DC | PRN

## 2018-12-23 MED ORDER — ONDANSETRON HCL 4 MG/2ML IJ SOLN: 4.0000 mg | Freq: Four times a day (QID) | INTRAMUSCULAR | Status: DC | PRN

## 2018-12-23 MED ORDER — CELECOXIB 200 MG PO CAPS
200.0000 mg | ORAL_CAPSULE | Freq: Two times a day (BID) | ORAL | Status: DC
  Filled 2018-12-23 (×4): qty 1

## 2018-12-23 MED ORDER — PREGABALIN 50 MG PO CAPS
50.0000 mg | ORAL_CAPSULE | Freq: Three times a day (TID) | ORAL | Status: DC
  Administered 2018-12-23 – 2018-12-24 (×2): 50 mg via ORAL
  Filled 2018-12-23 (×3): qty 1

## 2018-12-23 MED ORDER — INSULIN ASPART 100 UNIT/ML ~~LOC~~ SOLN
0.0000 [IU] | Freq: Every day | SUBCUTANEOUS | Status: DC
  Administered 2018-12-23: 22:00:00 2 [IU] via SUBCUTANEOUS

## 2018-12-23 MED ORDER — MAGNESIUM CITRATE PO SOLN: 1.0000 | Freq: Once | ORAL | Status: DC | PRN

## 2018-12-23 MED ORDER — ZOLPIDEM TARTRATE 10 MG PO TABS
10.0000 mg | ORAL_TABLET | Freq: Every day | ORAL | Status: DC
  Administered 2018-12-23: 22:00:00 10 mg via ORAL
  Filled 2018-12-23: qty 1

## 2018-12-23 MED ORDER — HYDROXYUREA 500 MG PO CAPS: 500.0000 mg | ORAL_CAPSULE | ORAL | Status: DC

## 2018-12-23 MED ORDER — SODIUM CHLORIDE 0.9 % IR SOLN
Status: DC | PRN
  Administered 2018-12-23: 1000 mL

## 2018-12-23 MED ORDER — CEFAZOLIN SODIUM-DEXTROSE 2-4 GM/100ML-% IV SOLN
2.0000 g | Freq: Four times a day (QID) | INTRAVENOUS | Status: AC
  Administered 2018-12-23 – 2018-12-24 (×2): 2 g via INTRAVENOUS
  Filled 2018-12-23 (×3): qty 100

## 2018-12-23 MED ORDER — OXYCODONE HCL 5 MG/5ML PO SOLN: 5.0000 mg | Freq: Once | ORAL | Status: DC | PRN

## 2018-12-23 MED ORDER — FENTANYL CITRATE (PF) 100 MCG/2ML IJ SOLN: 25.0000 ug | INTRAMUSCULAR | Status: DC | PRN

## 2018-12-23 MED ORDER — LACTATED RINGERS IV SOLN
INTRAVENOUS | Status: DC
  Administered 2018-12-23 (×3): via INTRAVENOUS

## 2018-12-23 MED ORDER — METOCLOPRAMIDE HCL 5 MG/ML IJ SOLN: 5.0000 mg | Freq: Three times a day (TID) | INTRAMUSCULAR | Status: DC | PRN

## 2018-12-23 MED ORDER — INSULIN ASPART 100 UNIT/ML ~~LOC~~ SOLN
0.0000 [IU] | Freq: Three times a day (TID) | SUBCUTANEOUS | Status: DC
  Administered 2018-12-24 (×2): 3 [IU] via SUBCUTANEOUS

## 2018-12-23 MED ORDER — FERROUS SULFATE 325 (65 FE) MG PO TABS
325.0000 mg | ORAL_TABLET | Freq: Three times a day (TID) | ORAL | Status: DC
  Administered 2018-12-23 – 2018-12-24 (×3): 325 mg via ORAL
  Filled 2018-12-23 (×3): qty 1

## 2018-12-23 MED ORDER — POTASSIUM CHLORIDE ER 10 MEQ PO CPCR: 10.0000 meq | ORAL_CAPSULE | Freq: Every day | ORAL | Status: DC | PRN

## 2018-12-23 MED ORDER — LORATADINE 10 MG PO TABS
10.0000 mg | ORAL_TABLET | Freq: Every day | ORAL | Status: DC
  Administered 2018-12-24: 11:00:00 10 mg via ORAL
  Filled 2018-12-23: qty 1

## 2018-12-23 MED ORDER — PROPOFOL 500 MG/50ML IV EMUL
INTRAVENOUS | Status: AC
  Filled 2018-12-23: qty 50

## 2018-12-23 MED ORDER — MIDAZOLAM HCL 5 MG/5ML IJ SOLN
INTRAMUSCULAR | Status: DC | PRN
  Administered 2018-12-23 (×2): 1 mg via INTRAVENOUS

## 2018-12-23 MED ORDER — PHENYLEPHRINE 40 MCG/ML (10ML) SYRINGE FOR IV PUSH (FOR BLOOD PRESSURE SUPPORT)
PREFILLED_SYRINGE | INTRAVENOUS | Status: DC | PRN
  Administered 2018-12-23: 80 ug via INTRAVENOUS
  Administered 2018-12-23: 120 ug via INTRAVENOUS

## 2018-12-23 MED ORDER — CHLORHEXIDINE GLUCONATE 4 % EX LIQD: 60.0000 mL | Freq: Once | CUTANEOUS | Status: DC

## 2018-12-23 MED ORDER — ACETAMINOPHEN 325 MG PO TABS: 325.0000 mg | ORAL_TABLET | Freq: Four times a day (QID) | ORAL | Status: DC | PRN

## 2018-12-23 MED ORDER — HYDROMORPHONE HCL 1 MG/ML IJ SOLN
0.5000 mg | INTRAMUSCULAR | Status: DC | PRN
  Administered 2018-12-23: 1 mg via INTRAVENOUS
  Filled 2018-12-23 (×2): qty 1

## 2018-12-23 MED ORDER — MENTHOL 3 MG MT LOZG: 1.0000 | LOZENGE | OROMUCOSAL | Status: DC | PRN

## 2018-12-23 MED ORDER — APIXABAN 2.5 MG PO TABS
2.5000 mg | ORAL_TABLET | Freq: Two times a day (BID) | ORAL | Status: DC
  Administered 2018-12-24: 2.5 mg via ORAL
  Filled 2018-12-23: qty 1

## 2018-12-23 MED ORDER — ROSUVASTATIN CALCIUM 20 MG PO TABS
20.0000 mg | ORAL_TABLET | Freq: Every evening | ORAL | Status: DC
  Administered 2018-12-23: 22:00:00 20 mg via ORAL
  Filled 2018-12-23: qty 1

## 2018-12-23 MED ORDER — FENTANYL CITRATE (PF) 100 MCG/2ML IJ SOLN
INTRAMUSCULAR | Status: DC | PRN
  Administered 2018-12-23 (×2): 50 ug via INTRAVENOUS

## 2018-12-23 MED ORDER — SODIUM CHLORIDE 0.9 % IV SOLN
INTRAVENOUS | Status: DC
  Administered 2018-12-23: 19:00:00 via INTRAVENOUS

## 2018-12-23 MED ORDER — ALUM & MAG HYDROXIDE-SIMETH 200-200-20 MG/5ML PO SUSP: 15.0000 mL | ORAL | Status: DC | PRN

## 2018-12-23 SURGICAL SUPPLY — 48 items
ADH SKN CLS APL DERMABOND .7 (GAUZE/BANDAGES/DRESSINGS) ×1
BAG DECANTER FOR FLEXI CONT (MISCELLANEOUS) IMPLANT
BAG SPEC THK2 15X12 ZIP CLS (MISCELLANEOUS)
BAG ZIPLOCK 12X15 (MISCELLANEOUS) IMPLANT
BLADE SAG 18X100X1.27 (BLADE) ×2 IMPLANT
BLADE SURG SZ10 CARB STEEL (BLADE) ×4 IMPLANT
COVER PERINEAL POST (MISCELLANEOUS) ×2 IMPLANT
COVER SURGICAL LIGHT HANDLE (MISCELLANEOUS) ×2 IMPLANT
COVER WAND RF STERILE (DRAPES) IMPLANT
CUP ACETBLR 54 OD PINNACLE (Hips) ×1 IMPLANT
DERMABOND ADVANCED (GAUZE/BANDAGES/DRESSINGS) ×1
DERMABOND ADVANCED .7 DNX12 (GAUZE/BANDAGES/DRESSINGS) ×1 IMPLANT
DRAPE STERI IOBAN 125X83 (DRAPES) ×2 IMPLANT
DRAPE U-SHAPE 47X51 STRL (DRAPES) ×4 IMPLANT
DRESSING AQUACEL AG SP 3.5X10 (GAUZE/BANDAGES/DRESSINGS) ×1 IMPLANT
DRSG AQUACEL AG SP 3.5X10 (GAUZE/BANDAGES/DRESSINGS) ×2
DURAPREP 26ML APPLICATOR (WOUND CARE) ×2 IMPLANT
ELECT BLADE TIP CTD 4 INCH (ELECTRODE) ×2 IMPLANT
ELECT REM PT RETURN 15FT ADLT (MISCELLANEOUS) ×2 IMPLANT
ELIMINATOR HOLE APEX DEPUY (Hips) ×1 IMPLANT
GLOVE BIO SURGEON STRL SZ 6 (GLOVE) ×4 IMPLANT
GLOVE BIOGEL PI IND STRL 6.5 (GLOVE) ×1 IMPLANT
GLOVE BIOGEL PI IND STRL 7.5 (GLOVE) ×1 IMPLANT
GLOVE BIOGEL PI IND STRL 8.5 (GLOVE) ×1 IMPLANT
GLOVE BIOGEL PI INDICATOR 6.5 (GLOVE) ×1
GLOVE BIOGEL PI INDICATOR 7.5 (GLOVE) ×1
GLOVE BIOGEL PI INDICATOR 8.5 (GLOVE) ×1
GLOVE ECLIPSE 8.0 STRL XLNG CF (GLOVE) ×4 IMPLANT
GLOVE ORTHO TXT STRL SZ7.5 (GLOVE) ×4 IMPLANT
GOWN STRL REUS W/TWL LRG LVL3 (GOWN DISPOSABLE) ×4 IMPLANT
GOWN STRL REUS W/TWL XL LVL3 (GOWN DISPOSABLE) ×2 IMPLANT
HEAD CERAMIC DELTA 36 PLUS 1.5 (Hips) ×1 IMPLANT
HOLDER FOLEY CATH W/STRAP (MISCELLANEOUS) ×2 IMPLANT
KIT TURNOVER KIT A (KITS) IMPLANT
LINER NEUTRAL 54X36MM PLUS 4 (Hips) ×1 IMPLANT
PACK ANTERIOR HIP CUSTOM (KITS) ×2 IMPLANT
PENCIL SMOKE EVACUATOR (MISCELLANEOUS) IMPLANT
SCREW 6.5MMX30MM (Screw) ×1 IMPLANT
STEM FEM ACTIS HIGH SZ8 (Stem) ×1 IMPLANT
SUT MNCRL AB 4-0 PS2 18 (SUTURE) ×2 IMPLANT
SUT STRATAFIX 0 PDS 27 VIOLET (SUTURE) ×2
SUT VIC AB 1 CT1 36 (SUTURE) ×6 IMPLANT
SUT VIC AB 2-0 CT1 27 (SUTURE) ×4
SUT VIC AB 2-0 CT1 TAPERPNT 27 (SUTURE) ×2 IMPLANT
SUTURE STRATFX 0 PDS 27 VIOLET (SUTURE) ×1 IMPLANT
TRAY FOLEY MTR SLVR 16FR STAT (SET/KITS/TRAYS/PACK) IMPLANT
WATER STERILE IRR 1000ML POUR (IV SOLUTION) ×2 IMPLANT
YANKAUER SUCT BULB TIP 10FT TU (MISCELLANEOUS) IMPLANT

## 2018-12-23 NOTE — Transfer of Care (Signed)
Immediate Anesthesia Transfer of Care Note  Patient: Justin [name]  Procedure(s) Performed: TOTAL HIP ARTHROPLASTY ANTERIOR APPROACH (Right Hip)  Patient Location: PACU  Anesthesia Type:Spinal  Level of Consciousness: sedated  Airway & Oxygen Therapy: Patient Spontanous Breathing and Patient connected to face mask oxygen  Post-op Assessment: Report given to RN and Post -op Vital signs reviewed and stable  Post vital signs: Reviewed and stable  Last Vitals:  Vitals Value Taken Time  BP    Temp    Pulse    Resp    SpO2      Last Pain:  Vitals:   12/23/18 1138  TempSrc:   PainSc: 8       Patients Stated Pain Goal: 2 ([id number])  Complications: No apparent anesthesia complications

## 2018-12-23 NOTE — Discharge Instructions (Addendum)

## 2018-12-23 NOTE — Anesthesia Postprocedure Evaluation (Signed)
Anesthesia Post Note  Patient: Justin [name]  Procedure(s) Performed: TOTAL HIP ARTHROPLASTY ANTERIOR APPROACH (Right Hip)     Patient location during evaluation: PACU Anesthesia Type: Spinal Level of consciousness: awake and alert and oriented Pain management: pain level controlled Vital Signs Assessment: post-procedure vital signs reviewed and stable Respiratory status: spontaneous breathing, nonlabored ventilation and respiratory function stable Cardiovascular status: blood pressure returned to baseline Postop Assessment: no apparent nausea or vomiting and spinal receding Anesthetic complications: no    Last Vitals:  Vitals:   12/23/18 1700 12/23/18 1715  BP: 118/62 128/71  Pulse: 86 80  Resp: 18 17  Temp: (!) 36.3 C   SpO2: 97% 96%    Last Pain:  Vitals:   12/23/18 1715  TempSrc:   PainSc: 0-No pain                 Brennan Bailey

## 2018-12-23 NOTE — Op Note (Signed)
NAME:  Damerius Werthmann                ACCOUNT NO.: [id number]      MEDICAL RECORD NO.: [id number]      FACILITY:  [facility]      PHYSICIAN:  Mauri Pole  DATE OF BIRTH:  [birth year]     DATE OF PROCEDURE:  12/23/2018                                 OPERATIVE REPORT         PREOPERATIVE DIAGNOSIS: Right  hip osteoarthritis.      POSTOPERATIVE DIAGNOSIS:  Right hip osteoarthritis.      PROCEDURE:  Right total hip replacement through an anterior approach   utilizing DePuy THR system, component size 57mm pinnacle cup, a size 36+4 neutral   Altrex liner, a size 8 Hi Actis stem with a 36+1.5 delta ceramic   ball.      SURGEON:  Pietro Cassis. Alvan Dame, M.D.      ASSISTANT:  Danae Orleans, PA-C     ANESTHESIA:  Spinal.      SPECIMENS:  None.      COMPLICATIONS:  None.      BLOOD LOSS:  900 cc     DRAINS:  None.      INDICATION OF THE PROCEDURE:  Free Diop is a 63 y.o. male who had   presented to office for evaluation of right hip pain.  Radiographs revealed   progressive degenerative changes with bone-on-bone   articulation of the  hip joint, including subchondral cystic changes and osteophytes.  The patient had painful limited range of   motion significantly affecting their overall quality of life and function.  The patient was failing to    respond to conservative measures including medications and/or injections and activity modification and at this point was ready   to proceed with more definitive measures.  Consent was obtained for   benefit of pain relief.  Specific risks of infection, DVT, component   failure, dislocation, neurovascular injury, and need for revision surgery were reviewed in the office as well discussion of   the anterior versus posterior approach were reviewed.     PROCEDURE IN DETAIL:  The patient was brought to operative theater.   Once adequate anesthesia, preoperative antibiotics, 2 gm of Ancef, 1 gm of Tranexamic Acid,  and 10 mg of Decadron were administered, the patient was positioned supine on the [employer] table.  Once the patient was safely positioned with adequate padding of boney prominences we predraped out the hip, and used fluoroscopy to confirm orientation of the pelvis.      The right hip was then prepped and draped from proximal iliac crest to   mid thigh with a shower curtain technique.      Time-out was performed identifying the patient, planned procedure, and the appropriate extremity.     An incision was then made 2 cm lateral to the   anterior superior iliac spine extending over the orientation of the   tensor fascia lata muscle and sharp dissection was carried down to the   fascia of the muscle.      The fascia was then incised.  The muscle belly was identified and swept   laterally and retractor placed along the superior neck.  Following   cauterization of the circumflex vessels and removing some pericapsular  fat, a second cobra retractor was placed on the inferior neck.  A T-capsulotomy was made along the line of the   superior neck to the trochanteric fossa, then extended proximally and   distally.  Tag sutures were placed and the retractors were then placed   intracapsular.  We then identified the trochanteric fossa and   orientation of my neck cut and then made a neck osteotomy with the femur on traction.  The femoral   head was removed without difficulty or complication.  Traction was let   off and retractors were placed posterior and anterior around the   acetabulum.      The labrum and foveal tissue were debrided.  I began reaming with a 47 mm   reamer and reamed up to 53 mm reamer with good bony bed preparation and a 54 mm  cup was chosen.  The final 54 mm Pinnacle cup was then impacted under fluoroscopy to confirm the depth of penetration and orientation with respect to   Abduction and forward flexion.  A screw was placed into the ilium followed by the hole eliminator.  The  final   36+4 neutral Altrex liner was impacted with good visualized rim fit.  The cup was positioned anatomically within the acetabular portion of the pelvis.      At this point, the femur was rolled to 100 degrees.  Further capsule was   released off the inferior aspect of the femoral neck.  I then   released the superior capsule proximally.  With the leg in a neutral position the hook was placed laterally   along the femur under the vastus lateralis origin and elevated manually and then held in position using the hook attachment on the bed.  The leg was then extended and adducted with the leg rolled to 100   degrees of external rotation.  Retractors were placed along the medial calcar and posteriorly over the greater trochanter.  Once the proximal femur was fully   exposed, I used a box osteotome to set orientation.  I then began   broaching with the starting chili pepper broach and passed this by hand and then broached up to 8.  With the 8 broach in place I chose a high offset neck and did several trial reductions.  The offset was appropriate, leg lengths   appeared to be equal best matched with the +1.5 head ball trial confirmed radiographically.   Given these findings, I went ahead and dislocated the hip, repositioned all   retractors and positioned the right hip in the extended and abducted position.  The final 8 Hi Actis stem was   chosen and it was impacted down to the level of neck cut.  Based on this   and the trial reductions, a final 36+1.5 delta ceramic ball was chosen and   impacted onto a clean and dry trunnion, and the hip was reduced.  The   hip had been irrigated throughout the case again at this point.  I did   reapproximate the superior capsular leaflet to the anterior leaflet   using #1 Vicryl.  The fascia of the   tensor fascia lata muscle was then reapproximated using #1 Vicryl and #0 Stratafix sutures.  The   remaining wound was closed with 2-0 Vicryl and running 4-0  Monocryl.   The hip was cleaned, dried, and dressed sterilely using Dermabond and   Aquacel dressing.  The patient was then brought   to recovery room in  stable condition tolerating the procedure well.    Danae Orleans, PA-C was present for the entirety of the case involved from   preoperative positioning, perioperative retractor management, general   facilitation of the case, as well as primary wound closure as assistant.            Pietro Cassis Alvan Dame, M.D.        12/23/2018 3:48 PM

## 2018-12-23 NOTE — Anesthesia Procedure Notes (Signed)
Spinal  Patient location during procedure: OR Start time: 12/23/2018 2:28 PM End time: 12/23/2018 2:33 PM Staffing Anesthesiologist: Brennan Bailey, MD Resident/CRNA: Talbot Grumbling, CRNA Performed: resident/CRNA  Preanesthetic Checklist Completed: patient identified, surgical consent, pre-op evaluation, timeout performed, IV checked, risks and benefits discussed and monitors and equipment checked Spinal Block Patient position: sitting Prep: DuraPrep Patient monitoring: heart rate, cardiac monitor, continuous pulse ox and blood pressure Approach: midline Location: L3-4 Injection technique: single-shot Needle Needle type: Pencan  Needle gauge: 24 G Needle length: 9 cm Assessment Sensory level: T4 Additional Notes Clear CSF, no paresthesia, patient tolerated well.

## 2018-12-23 NOTE — Interval H&P Note (Signed)
History and Physical Interval Note:  12/23/2018 1:52 PM  Justin [name]  has presented today for surgery, with the diagnosis of Right hip osteoarthritis.  The various methods of treatment have been discussed with the patient and family. After consideration of risks, benefits and other options for treatment, the patient has consented to  Procedure(s) with comments: TOTAL HIP ARTHROPLASTY ANTERIOR APPROACH (Right) - 70 mins as a surgical intervention.  The patient's history has been reviewed, patient examined, no change in status, stable for surgery.  I have reviewed the patient's chart and labs.  Questions were answered to the patient's satisfaction.     Mauri Pole

## 2018-12-24 LAB — CBC
HCT: 35.9 % — ABNORMAL LOW (ref 39.0–52.0)
Hemoglobin: 11.6 g/dL — ABNORMAL LOW (ref 13.0–17.0)
MCH: 32.1 pg (ref 26.0–34.0)
MCHC: 32.3 g/dL (ref 30.0–36.0)
MCV: 99.4 fL (ref 80.0–100.0)
Platelets: 250 10*3/uL (ref 150–400)
RBC: 3.61 MIL/uL — ABNORMAL LOW (ref 4.22–5.81)
WBC: 32.6 10*3/uL — ABNORMAL HIGH (ref 4.0–10.5)
nRBC: 0.1 % (ref 0.0–0.2)

## 2018-12-24 LAB — BASIC METABOLIC PANEL
Anion gap: 12 (ref 5–15)
BUN: 10 mg/dL (ref 8–23)
CO2: 22 mmol/L (ref 22–32)
Calcium: 8.6 mg/dL — ABNORMAL LOW (ref 8.9–10.3)
Chloride: 99 mmol/L (ref 98–111)
Creatinine, Ser: 0.58 mg/dL — ABNORMAL LOW (ref 0.61–1.24)
GFR calc Af Amer: >60 mL/min (ref >60)
GFR calc non Af Amer: >60 mL/min (ref >60)
Glucose, Bld: 173 mg/dL — ABNORMAL HIGH (ref 70–99)
Potassium: 4 mmol/L (ref 3.5–5.1)
Sodium: 133 mmol/L — ABNORMAL LOW (ref 135–145)

## 2018-12-24 LAB — GLUCOSE, CAPILLARY
Glucose-Capillary: 161 mg/dL — ABNORMAL HIGH (ref 70–99)
Glucose-Capillary: 197 mg/dL — ABNORMAL HIGH (ref 70–99)

## 2018-12-24 MED ORDER — METHOCARBAMOL 500 MG PO TABS: 500.0000 mg | ORAL_TABLET | Freq: Four times a day (QID) | ORAL | 0 refills | Status: DC | PRN

## 2018-12-24 MED ORDER — CHLORHEXIDINE GLUCONATE CLOTH 2 % EX PADS: 6.0000 | MEDICATED_PAD | Freq: Every day | CUTANEOUS | Status: DC

## 2018-12-24 MED ORDER — HYDROCODONE-ACETAMINOPHEN 7.5-325 MG PO TABS: 1.0000 | ORAL_TABLET | ORAL | 0 refills | Status: DC | PRN

## 2018-12-24 NOTE — Evaluation (Signed)
Physical Therapy Evaluation Patient Details Name: Justin [name] MRN: [id number] DOB: 10-Oct-1955 Today's Date: 12/24/2018   History of Present Illness  Pt is a 63 y.o. former smoker (5.75 pack years, quit 01/02/18) with h/o HTN, DM II, CAD (CABG12/2019), A-fib, AS s/p valve replacement 12/2017, s/p right carotid endarterectomy 12/2017, on Eliquis, polycythemia, right hip OA scheduled for above procedure 12/23/2018 with Dr. Paralee Cancel.  9 months s/p CABG x4, aortic valve replacement, and right carotid endarterectomy.    Clinical Impression  Pt is s/p R THA resulting in the deficits listed below (see PT Problem List). Pt requires increased time with bed mobility, transfers, and RW due to R hip pain. Pt with improvement in pain after ambulation, no trendelenburg noted and improvement in equal step length and weight-shift to RLE with distance. Pt with safe mobility, responds well to verbal cues for sequencing with mobility post surgery and no increased pain with therapeutic exercise. Pt tolerates remaining up in bedside chair at EOS with call bell in lap and educated to call nursing when wanting to return to bed. Pt will benefit from skilled PT to increase their independence and safety with mobility to allow discharge to the venue listed below.      Follow Up Recommendations Follow surgeon's recommendation for DC plan and follow-up therapies    Equipment Recommendations  Rolling walker with 5" wheels    Recommendations for Other Services       Precautions / Restrictions Precautions Precautions: Anterior Hip Restrictions Weight Bearing Restrictions: Yes RLE Weight Bearing: Weight bearing as tolerated      Mobility  Bed Mobility Overal bed mobility: Modified Independent             General bed mobility comments: increased time, used of bedrail, assist to remove pillow under RLE and SCDs  Transfers Overall transfer level: Needs assistance Equipment used: Rolling walker (2  wheeled) Transfers: Sit to/from Stand Sit to Stand: Supervision         General transfer comment: verbal cues to avoid pulling on RW to stand  Ambulation/Gait Ambulation/Gait assistance: Supervision Gait Distance (Feet): 80 Feet Assistive device: Rolling walker (2 wheeled) Gait Pattern/deviations: Decreased step length - left;Decreased stance time - right;Decreased stride length;Decreased dorsiflexion - right;Decreased weight shift to right Gait velocity: decreased   General Gait Details: initially decreased weight-shift to RLE improving with distance, verbal cues for short steps and sequencing with turns, no unsteadiness or trendelenburg noted  Stairs      Wheelchair Mobility    Modified Rankin (Stroke Patients Only)       Balance Overall balance assessment: Needs assistance Sitting-balance support: Feet supported;No upper extremity supported Sitting balance-Leahy Scale: Good Sitting balance - Comments: seated EOB   Standing balance support: During functional activity;Bilateral upper extremity supported Standing balance-Leahy Scale: Fair Standing balance comment: using RW            Pertinent Vitals/Pain Pain Assessment: 0-10 Pain Score: 4  Pain Location: R hip Pain Descriptors / Indicators: Aching Pain Intervention(s): Limited activity within patient's tolerance;Monitored during session;Premedicated before session    Home Living Family/patient expects to be discharged to:: Private residence Living Arrangements: Spouse/significant other Available Help at Discharge: Family;Available 24 hours/day Type of Home: House Home Access: Stairs to enter Entrance Stairs-Rails: None Entrance Stairs-Number of Steps: 1 Home Layout: One level Home Equipment: None      Prior Function Level of Independence: Independent         Comments: Pt reports Ind with all ADLs, community  ambulation without AD, limited in activity secondary due to bil hip pain with mobility      Hand Dominance        Extremity/Trunk Assessment        Lower Extremity Assessment Lower Extremity Assessment: Overall WFL for tasks assessed RLE Deficits / Details: grossly assessed 4/5 RLE Sensation: WNL RLE Coordination: WNL LLE Deficits / Details: grossly assessed 5/5 LLE Sensation: WNL LLE Coordination: WNL    Cervical / Trunk Assessment Cervical / Trunk Assessment: Normal  Communication   Communication: No difficulties  Cognition Arousal/Alertness: Awake/alert Behavior During Therapy: WFL for tasks assessed/performed Overall Cognitive Status: Within Functional Limits for tasks assessed             General Comments      Exercises General Exercises - Lower Extremity Ankle Circles/Pumps: Seated;Strengthening;Both;10 reps Quad Sets: Seated;Strengthening;Right;10 reps Gluteal Sets: Seated;Strengthening;Both;10 reps   Assessment/Plan    PT Assessment Patient needs continued PT services  PT Problem List Decreased strength;Decreased activity tolerance;Decreased balance;Decreased mobility;Decreased knowledge of use of DME;Pain       PT Treatment Interventions DME instruction;Gait training;Stair training;Functional mobility training;Therapeutic activities;Therapeutic exercise;Balance training;Neuromuscular re-education;Patient/family education;Manual techniques;Modalities    PT Goals (Current goals can be found in the Care Plan section)  Acute Rehab PT Goals Patient Stated Goal: go home PT Goal Formulation: With patient Time For Goal Achievement: 12/31/18 Potential to Achieve Goals: Good    Frequency BID   Barriers to discharge        Co-evaluation               AM-PAC PT "6 Clicks" Mobility  Outcome Measure Help needed turning from your back to your side while in a flat bed without using bedrails?: None Help needed moving from lying on your back to sitting on the side of a flat bed without using bedrails?: A Little Help needed moving to  and from a bed to a chair (including a wheelchair)?: A Little Help needed standing up from a chair using your arms (e.g., wheelchair or bedside chair)?: A Little Help needed to walk in hospital room?: A Little Help needed climbing 3-5 steps with a railing? : A Little 6 Click Score: 19    End of Session Equipment Utilized During Treatment: Gait belt Activity Tolerance: Patient tolerated treatment well Patient left: in chair;with call bell/phone within reach Nurse Communication: Mobility status PT Visit Diagnosis: Other abnormalities of gait and mobility (R26.89);Muscle weakness (generalized) (M62.81);Pain Pain - Right/Left: Right Pain - part of body: Hip    Time: [id number] PT Time Calculation (min) (ACUTE ONLY): 23 min   Charges:   PT Evaluation $PT Eval Low Complexity: 1 Low PT Treatments $Gait Training: 8-22 mins $Therapeutic Exercise: 8-22 mins         Tori Cleston Lautner PT, DPT 12/24/18, 11:15 AM [phone]

## 2018-12-24 NOTE — Progress Notes (Signed)
Physical Therapy Treatment Patient Details Name: Justin [name] MRN: [id number] DOB: [birth year] Today's Date: 12/24/2018    History of Present Illness Pt is a 63 y.o. former smoker (5.75 pack years, quit 01/02/18) with h/o HTN, DM II, CAD (CABG12/2019), A-fib, AS s/p valve replacement 12/2017, s/p right carotid endarterectomy 12/2017, on Eliquis, polycythemia, right hip OA scheduled for above procedure 12/23/2018 with Dr. Paralee Cancel.  9 months s/p CABG x4, aortic valve replacement, and right carotid endarterectomy.    PT Comments    POD # 1 pm session Pt OOB in recliner.  Assisted with amb and practiced one step pt has to enter home.  General transfer comment: <25% VC's on proper hand placement and safety with turns.  General Gait Details: <25% VC's on proper walker to self distance and safety with turns.  General stair comments: 25% VC's on proper walker placement and proper sequencing/safety up/down one step.  Then returned to room to perform some TE's following HEP handout.  Instructed on proper tech, freq as well as use of ICE.   Addressed all mobility questions, discussed appropriate activity, educated on use of ICE.  Pt ready for D/C to home.   Follow Up Recommendations  Follow surgeon's recommendation for DC plan and follow-up therapies     Equipment Recommendations  Rolling walker with 5" wheels;3in1 (PT)    Recommendations for Other Services       Precautions / Restrictions Precautions Precautions: Anterior Hip Restrictions Weight Bearing Restrictions: No RLE Weight Bearing: Weight bearing as tolerated    Mobility  Bed Mobility               General bed mobility comments: OOB in recliner  Transfers Overall transfer level: Needs assistance Equipment used: Rolling walker (2 wheeled) Transfers: Sit to/from Stand Sit to Stand: Supervision         General transfer comment: <25% VC's on proper hand placement and safety with  turns  Ambulation/Gait Ambulation/Gait assistance: Supervision Gait Distance (Feet): 45 Feet Assistive device: Rolling walker (2 wheeled) Gait Pattern/deviations: Decreased step length - left;Decreased stance time - right;Decreased stride length;Decreased dorsiflexion - right;Decreased weight shift to right Gait velocity: decreased   General Gait Details: <25% VC's on proper walker to self distance and safety with turns   Stairs Stairs: Yes Stairs assistance: Supervision;Min guard Stair Management: No rails;Step to pattern;With walker;Forwards Number of Stairs: 1 General stair comments: 25% VC's on proper walker placement and proper sequencing/safety   Wheelchair Mobility    Modified Rankin (Stroke Patients Only)       Balance                                            Cognition Arousal/Alertness: Awake/alert Behavior During Therapy: WFL for tasks assessed/performed Overall Cognitive Status: Within Functional Limits for tasks assessed                                        Exercises   Total Hip Replacement TE's 10 reps ankle pumps 10 reps knee presses 10 reps heel slides 10 reps SAQ's 10 reps ABD 10 reps all standing TE's Followed by ICE     General Comments        Pertinent Vitals/Pain Pain Assessment: 0-10 Pain Score: 5  Pain Location: R hip  Pain Descriptors / Indicators: Aching;Tender;Sore Pain Intervention(s): Monitored during session;Repositioned;Patient requesting pain meds-RN notified;Ice applied    Home Living                      Prior Function            PT Goals (current goals can now be found in the care plan section) Progress towards PT goals: Progressing toward goals    Frequency           PT Plan Current plan remains appropriate    Co-evaluation              AM-PAC PT "6 Clicks" Mobility   Outcome Measure  Help needed turning from your back to your side while in a flat  bed without using bedrails?: None Help needed moving from lying on your back to sitting on the side of a flat bed without using bedrails?: A Little Help needed moving to and from a bed to a chair (including a wheelchair)?: A Little Help needed standing up from a chair using your arms (e.g., wheelchair or bedside chair)?: A Little Help needed to walk in hospital room?: A Little Help needed climbing 3-5 steps with a railing? : A Little 6 Click Score: 19    End of Session Equipment Utilized During Treatment: Gait belt Activity Tolerance: Patient tolerated treatment well Patient left: in chair;with call bell/phone within reach Nurse Communication: Mobility status PT Visit Diagnosis: Other abnormalities of gait and mobility (R26.89);Muscle weakness (generalized) (M62.81);Pain Pain - Right/Left: Right Pain - part of body: Hip     Time: [id number] PT Time Calculation (min) (ACUTE ONLY): 25 min  Charges:  $Gait Training: 8-22 mins $Therapeutic Exercise: 8-22 mins                     [name]  PTA Acute  Rehabilitation Services Pager      [phone] Office      [phone]

## 2018-12-24 NOTE — TOC Progression Note (Signed)
Transition of Care [facility]) - Progression Note    Patient Details  Name: Justin [name] MRN: [id number] Date of Birth: 1955/04/06  [facility] CM/SW Contact  Joaquin Courts, RN Phone Number: 12/24/2018, 12:23 PM  Clinical Narrative:    CM spoke with patient who reports he will be doing the home exercise program at dc. [employer] to deliver rolling walker and 3-in-1 to bedside for home use.    Expected Discharge Plan: Home/Self Care Barriers to Discharge: No Barriers Identified  Expected Discharge Plan and Services Expected Discharge Plan: Home/Self Care   Discharge Planning Services: CM Consult   Living arrangements for the past 2 months: Single Family Home Expected Discharge Date: 12/24/18               DME Arranged: 3-N-1, Walker rolling DME Agency: [employer] Date DME Agency Contacted: 12/24/18 Time DME Agency Contacted: 1222 Representative spoke with at DME Agency: [facility] Arranged: NA [place] Agency: NA         Social Determinants of Health ([place]) Interventions    Readmission Risk Interventions No flowsheet data found.

## 2018-12-24 NOTE — Progress Notes (Signed)
    Subjective:  Patient reports pain as mild to moderate.  Denies N/V/CP/SOB. No c/o.  Objective:   VITALS:   Vitals:   12/23/18 2125 12/23/18 2222 12/24/18 0205 12/24/18 0455  BP: 129/72 129/69 (!) 151/84 (!) 144/70  Pulse: 92 (!) 105 96 (!) 108  Resp: 18 18 18 16   Temp: 97.6 F (36.4 C) 98 F (36.7 C) 98.1 F (36.7 C) 98 F (36.7 C)  TempSrc: Oral Oral Oral Oral  SpO2: 96% 97% 96% 96%  Weight:    99.7 kg  Height:        NAD ABD soft Sensation intact distally Intact pulses distally Dorsiflexion/Plantar flexion intact Incision: dressing C/D/I Compartment soft   Lab Results  Component Value Date   WBC 32.6 (H) 12/24/2018   HGB 11.6 (L) 12/24/2018   HCT 35.9 (L) 12/24/2018   MCV 99.4 12/24/2018   PLT 250 12/24/2018   BMET    Component Value Date/Time   NA 133 (L) 12/24/2018 0451   K 4.0 12/24/2018 0451   CL 99 12/24/2018 0451   CO2 22 12/24/2018 0451   GLUCOSE 173 (H) 12/24/2018 0451   BUN 10 12/24/2018 0451   CREATININE 0.58 (L) 12/24/2018 0451   CREATININE 0.82 12/21/2018 1139   CALCIUM 8.6 (L) 12/24/2018 0451   GFRNONAA >60 12/24/2018 0451   GFRNONAA >60 12/21/2018 1139   GFRAA >60 12/24/2018 0451   GFRAA >60 12/21/2018 1139     Assessment/Plan: 1 Day Post-Op   Active Problems:   Status post right hip replacement   WBAT with walker DVT ppx: Aspirin, SCDs, TEDS PO pain control PT/OT Dispo: D/C home with HHPT   Hilton Cork Tully Mcinturff 12/24/2018, 9:40 AM   Rod Can, MD [phone] [place] is now [facility]  [place] [address], [address], [place], [place] [address] Phone: [phone] [email or web address] Facebook  [employer]

## 2018-12-26 ENCOUNTER — Other Ambulatory Visit: Payer: Self-pay | Admitting: Hematology

## 2018-12-26 ENCOUNTER — Ambulatory Visit: Payer: BC Managed Care – PPO | Admitting: Cardiology

## 2018-12-26 ENCOUNTER — Encounter (HOSPITAL_COMMUNITY): Payer: Self-pay | Admitting: Orthopedic Surgery

## 2018-12-26 DIAGNOSIS — D45 Polycythemia vera: Secondary | ICD-10-CM

## 2018-12-26 NOTE — Discharge Summary (Signed)
Physician Discharge Summary   Patient ID: Justin [name] MRN: [id number] DOB/AGE: 64-Nov-1957 64 y.o.  Admit date: 12/23/2018 Discharge date: 12/24/2018  Primary Diagnosis:  Right  hip osteoarthritis  Admission Diagnoses:  Past Medical History:  Diagnosis Date   Acute meniscal tear of knee LEFT   Anemia    Aortic stenosis 12/01/2017   NONRHEUMATIC, AORTIC VALVE CALCIFICATIONS, MILD TO MODERATE REGURG, MILD TO MODERATE CALCIFIED ANNULUS per ECHO 10/25/17 @ [facility] [address]   Arthritis    Atrial fibrillation ([place]) 11/12/2017   AT O/V WITH PCP   Cancer ([place])    skin right arm   Coronary artery disease    DM (diabetes mellitus) ([place])    Heart murmur MILD-- ASYMPTOMATIC   History of kidney stones    Hyperlipidemia    Hypertension    Left knee pain    PAD (peripheral artery disease) ([place])    left leg claudication   Discharge Diagnoses:   Active Problems:   Status post right hip replacement  Estimated body mass index is 33.42 kg/m as calculated from the following:   Height as of this encounter: 5\' 8"  (1.727 m).   Weight as of this encounter: 99.7 kg.  Procedure:  Procedure(s) (LRB): TOTAL HIP ARTHROPLASTY ANTERIOR APPROACH (Right)   Consults: None  HPI: Justin [name] is a 64 y.o. male who had   presented to office for evaluation of right hip pain.  Radiographs revealed   progressive degenerative changes with bone-on-bone articulation of the  hip joint, including subchondral cystic changes and osteophytes.  The patient had painful limited range of  motion significantly affecting their overall quality of life and function.  The patient was failing to respond to conservative measures including medications and/or injections and activity modification and at this point was ready to proceed with more definitive measures.  Consent was obtained for benefit of pain relief.  Specific risks of infection, DVT, component  failure, dislocation, neurovascular injury,  and need for revision surgery were reviewed in the office as well discussion of the anterior versus posterior approach were reviewed.    Laboratory Data: Admission on 12/23/2018, Discharged on 12/24/2018  Component Date Value Ref Range Status   Glucose-Capillary 12/23/2018 118* 70 - 99 mg/dL Final   Glucose-Capillary 12/23/2018 101* 70 - 99 mg/dL Final   WBC 12/24/2018 32.6* 4.0 - 10.5 K/uL Final   RBC 12/24/2018 3.61* 4.22 - 5.81 MIL/uL Final   Hemoglobin 12/24/2018 11.6* 13.0 - 17.0 g/dL Final   HCT 12/24/2018 35.9* 39.0 - 52.0 % Final   MCV 12/24/2018 99.4  80.0 - 100.0 fL Final   MCH 12/24/2018 32.1  26.0 - 34.0 pg Final   MCHC 12/24/2018 32.3  30.0 - 36.0 g/dL Final   RDW 12/24/2018 Not Measured  11.5 - 15.5 % Final   Platelets 12/24/2018 250  150 - 400 K/uL Final   nRBC 12/24/2018 0.1  0.0 - 0.2 % Final   Performed at [facility], [place] [address], [place], [place] [address]   Sodium 12/24/2018 133* 135 - 145 mmol/L Final   Potassium 12/24/2018 4.0  3.5 - 5.1 mmol/L Final   Chloride 12/24/2018 99  98 - 111 mmol/L Final   CO2 12/24/2018 22  22 - 32 mmol/L Final   Glucose, Bld 12/24/2018 173* 70 - 99 mg/dL Final   BUN 12/24/2018 10  8 - 23 mg/dL Final   Creatinine, Ser 12/24/2018 0.58* 0.61 - 1.24 mg/dL Final   Calcium 12/24/2018 8.6* 8.9 -  10.3 mg/dL Final   GFR calc non Af Amer 12/24/2018 >60  >60 mL/min Final   GFR calc Af Amer 12/24/2018 >60  >60 mL/min Final   Anion gap 12/24/2018 12  5 - 15 Final   Performed at [facility], [place] [address], [place], [place] [address]   Glucose-Capillary 12/23/2018 146* 70 - 99 mg/dL Final   Glucose-Capillary 12/23/2018 231* 70 - 99 mg/dL Final   Comment 1 12/23/2018 Notify RN   Final   Comment 2 12/23/2018 Document in Chart   Final   Glucose-Capillary 12/24/2018 161* 70 - 99 mg/dL Final   Glucose-Capillary 12/24/2018 197* 70 - 99 mg/dL Final  Hospital Outpatient Visit on  12/21/2018  Component Date Value Ref Range Status   ABO/RH(D) 12/21/2018 A POS   Final   Antibody Screen 12/21/2018 NEG   Final   Sample Expiration 12/21/2018 12/26/2018,2359   Final   Extend sample reason 12/21/2018    Final                   Value:NO TRANSFUSIONS OR PREGNANCY IN THE PAST 3 MONTHS Performed at [facility], [place] [address], [place], [place] [address]    Hgb A1c MFr Bld 12/21/2018 6.0* 4.8 - 5.6 % Final   Comment: (NOTE) Pre diabetes:          5.7%-6.4% Diabetes:              >6.4% Glycemic control for   <7.0% adults with diabetes    Mean Plasma Glucose 12/21/2018 125.5  mg/dL Final   Performed at [facility], [place] [address], [place], [place] [address]   Glucose-Capillary 12/21/2018 140* 70 - 99 mg/dL Final   MRSA, PCR 12/21/2018 NEGATIVE  NEGATIVE Final   Staphylococcus aureus 12/21/2018 NEGATIVE  NEGATIVE Final   Comment: (NOTE) The Xpert SA Assay (FDA approved for NASAL specimens in patients 56 years of age and older), is one component of a comprehensive surveillance program. It is not intended to diagnose infection nor to guide or monitor treatment. Performed at [facility], [place] [address], [place], [place] [address]    ABO/RH(D) 12/21/2018    Final                   Value:A POS Performed at [facility], [place] [address] [place], [place] [address]   Appointment on 12/21/2018  Component Date Value Ref Range Status   LDH 12/21/2018 441* 98 - 192 U/L Final   Performed at [facility], [place] [address], [place], [place] [address]   Sodium 12/21/2018 135  135 - 145 mmol/L Final   Potassium 12/21/2018 4.5  3.5 - 5.1 mmol/L Final   Chloride 12/21/2018 97* 98 - 111 mmol/L Final   CO2 12/21/2018 29  22 - 32 mmol/L Final   Glucose, Bld 12/21/2018 109* 70 - 99 mg/dL Final   BUN 12/21/2018 11  8 - 23 mg/dL Final   Creatinine 12/21/2018 0.82  0.61 - 1.24 mg/dL  Final   Calcium 12/21/2018 10.3  8.9 - 10.3 mg/dL Final   Total Protein 12/21/2018 8.4* 6.5 - 8.1 g/dL Final   Albumin 12/21/2018 4.5  3.5 - 5.0 g/dL Final   AST 12/21/2018 17  15 - 41 U/L Final   ALT 12/21/2018 9  0 - 44 U/L Final   Alkaline Phosphatase 12/21/2018 122  38 - 126 U/L Final   Total Bilirubin 12/21/2018 0.6  0.3 - 1.2 mg/dL Final   GFR, Est Non Af Am 12/21/2018 >60  >60 mL/min Final   GFR, Est AFR Am 12/21/2018 >60  >60 mL/min Final   Anion gap 12/21/2018 9  5 - 15 Final   Performed at [facility] at [facility], [address], [place], [place] [address]   WBC Count 12/21/2018 37.6* 4.0 - 10.5 K/uL Final   RBC 12/21/2018 4.99  4.22 - 5.81 MIL/uL Final   Hemoglobin 12/21/2018 16.2  13.0 - 17.0 g/dL Final   HCT 12/21/2018 48.7  39.0 - 52.0 % Final   MCV 12/21/2018 97.6  80.0 - 100.0 fL Final   MCH 12/21/2018 32.5  26.0 - 34.0 pg Final   MCHC 12/21/2018 33.3  30.0 - 36.0 g/dL Final   RDW 12/21/2018 Not Measured  11.5 - 15.5 % Final   Platelet Count 12/21/2018 274  150 - 400 K/uL Final   nRBC 12/21/2018 0.1  0.0 - 0.2 % Final   Neutrophils Relative % 12/21/2018 81  % Final   Neutro Abs 12/21/2018 30.6* 1.7 - 7.7 K/uL Final   Lymphocytes Relative 12/21/2018 5  % Final   Lymphs Abs 12/21/2018 1.7  0.7 - 4.0 K/uL Final   Monocytes Relative 12/21/2018 5  % Final   Monocytes Absolute 12/21/2018 1.9* 0.1 - 1.0 K/uL Final   Eosinophils Relative 12/21/2018 1  % Final   Eosinophils Absolute 12/21/2018 0.2  0.0 - 0.5 K/uL Final   Basophils Relative 12/21/2018 1  % Final   Basophils Absolute 12/21/2018 0.5* 0.0 - 0.1 K/uL Final   WBC Morphology 12/21/2018 HYPERSEGMENTED NEUT   Final   Immature Granulocytes 12/21/2018 7  % Final   metas and myelos   Abs Immature Granulocytes 12/21/2018 2.79* 0.00 - 0.07 K/uL Final   Performed at [facility] at [facility], [address], [place], [place]  [address]  Hospital Outpatient Visit on 12/20/2018  Component Date Value Ref Range Status   SARS-CoV-2, NAA 12/20/2018 NOT DETECTED  NOT DETECTED Final   Comment: (NOTE) This nucleic acid amplification test was developed and its performance characteristics determined by [employer]. Nucleic acid amplification tests include PCR and TMA. This test has not been FDA cleared or approved. This test has been authorized by FDA under an Emergency Use Authorization (EUA). This test is only authorized for the duration of time the declaration that circumstances exist justifying the authorization of the emergency use of in vitro diagnostic tests for detection of SARS-CoV-2 virus and/or diagnosis of COVID-19 infection under section 564(b)(1) of the Act, 21 U.S.C. [id number]) (1), unless the authorization is terminated or revoked sooner. When diagnostic testing is negative, the possibility of a false negative result should be considered in the context of a patient's recent exposures and the presence of clinical signs and symptoms consistent with COVID-19. An individual without symptoms of COVID- 19 and who is not shedding SARS-CoV-2 vi                          rus would expect to have a negative (not detected) result in this assay. Performed At: [facility] [place], [place] [id number] Rush Farmer MD [id number]    Coronavirus Source 12/20/2018 NASOPHARYNGEAL   Final   Performed at [place] [address], [place], [place] [address]  Appointment on 12/06/2018  Component Date Value Ref Range Status  WBC Count 12/06/2018 40.8* 4.0 - 10.5 K/uL Final   REPEATED TO VERIFY   RBC 12/06/2018 5.15  4.22 - 5.81 MIL/uL Final   Hemoglobin 12/06/2018 16.3  13.0 - 17.0 g/dL Final   HCT 12/06/2018 50.3  39.0 - 52.0 % Final   MCV 12/06/2018 97.7  80.0 - 100.0 fL Final   MCH 12/06/2018 31.7  26.0 - 34.0 pg Final   MCHC 12/06/2018 32.4  30.0 - 36.0 g/dL  Final   RDW 12/06/2018 Not Measured  11.5 - 15.5 % Final   dimorphic population   Platelet Count 12/06/2018 205  150 - 400 K/uL Final   nRBC 12/06/2018 0.1  0.0 - 0.2 % Final   Neutrophils Relative % 12/06/2018 82  % Final   3 meta, 3 myelocytes   Neutro Abs 12/06/2018 33.3* 1.7 - 7.7 K/uL Final   Lymphocytes Relative 12/06/2018 5  % Final   Lymphs Abs 12/06/2018 2.1  0.7 - 4.0 K/uL Final   Monocytes Relative 12/06/2018 5  % Final   Monocytes Absolute 12/06/2018 2.0* 0.1 - 1.0 K/uL Final   Eosinophils Relative 12/06/2018 0  % Final   Eosinophils Absolute 12/06/2018 0.1  0.0 - 0.5 K/uL Final   Basophils Relative 12/06/2018 2  % Final   Basophils Absolute 12/06/2018 0.6* 0.0 - 0.1 K/uL Final   Immature Granulocytes 12/06/2018 6  % Final   Abs Immature Granulocytes 12/06/2018 2.62* 0.00 - 0.07 K/uL Final   Performed at [facility] at [facility], [address], [place], [place] [address]   Sodium 12/06/2018 137  135 - 145 mmol/L Final   Potassium 12/06/2018 4.2  3.5 - 5.1 mmol/L Final   Chloride 12/06/2018 98  98 - 111 mmol/L Final   CO2 12/06/2018 29  22 - 32 mmol/L Final   Glucose, Bld 12/06/2018 103* 70 - 99 mg/dL Final   BUN 12/06/2018 12  8 - 23 mg/dL Final   Creatinine 12/06/2018 0.89  0.61 - 1.24 mg/dL Final   Calcium 12/06/2018 10.4* 8.9 - 10.3 mg/dL Final   Total Protein 12/06/2018 7.8  6.5 - 8.1 g/dL Final   Albumin 12/06/2018 4.5  3.5 - 5.0 g/dL Final   AST 12/06/2018 20  15 - 41 U/L Final   ALT 12/06/2018 9  0 - 44 U/L Final   Alkaline Phosphatase 12/06/2018 119  38 - 126 U/L Final   Total Bilirubin 12/06/2018 0.6  0.3 - 1.2 mg/dL Final   GFR, Est Non Af Am 12/06/2018 >60  >60 mL/min Final   GFR, Est AFR Am 12/06/2018 >60  >60 mL/min Final   Anion gap 12/06/2018 10  5 - 15 Final   Performed at [facility] at [facility], [address] [place], [place] [address]   LDH 12/06/2018  490* 98 - 192 U/L Final   Performed at [facility], [place] [address], [place], [place] [address]  Appointment on 11/03/2018  Component Date Value Ref Range Status   WBC Count 11/03/2018 20.6* 4.0 - 10.5 K/uL Final   RBC 11/03/2018 4.74  4.22 - 5.81 MIL/uL Final   Hemoglobin 11/03/2018 14.1  13.0 - 17.0 g/dL Final   HCT 11/03/2018 45.5  39.0 - 52.0 % Final   MCV 11/03/2018 96.0  80.0 - 100.0 fL Final   MCH 11/03/2018 29.7  26.0 - 34.0 pg Final   MCHC 11/03/2018 31.0  30.0 - 36.0 g/dL Final  RDW 11/03/2018 Not Measured  11.5 - 15.5 % Final   Platelet Count 11/03/2018 339  150 - 400 K/uL Final   nRBC 11/03/2018 0.3* 0.0 - 0.2 % Final   Neutrophils Relative % 11/03/2018 74  % Final   Neutro Abs 11/03/2018 15.4* 1.7 - 7.7 K/uL Final   Lymphocytes Relative 11/03/2018 9  % Final   Lymphs Abs 11/03/2018 1.9  0.7 - 4.0 K/uL Final   Monocytes Relative 11/03/2018 5  % Final   Monocytes Absolute 11/03/2018 1.1* 0.1 - 1.0 K/uL Final   Eosinophils Relative 11/03/2018 1  % Final   Eosinophils Absolute 11/03/2018 0.1  0.0 - 0.5 K/uL Final   Basophils Relative 11/03/2018 2  % Final   Basophils Absolute 11/03/2018 0.4* 0.0 - 0.1 K/uL Final   Immature Granulocytes 11/03/2018 9  % Final   metas and myelos   Abs Immature Granulocytes 11/03/2018 1.82* 0.00 - 0.07 K/uL Final   Performed at [facility] at [facility], [address] [place], [place] [address]   Sodium 11/03/2018 141  135 - 145 mmol/L Final   Potassium 11/03/2018 4.6  3.5 - 5.1 mmol/L Final   Chloride 11/03/2018 101  98 - 111 mmol/L Final   CO2 11/03/2018 31  22 - 32 mmol/L Final   Glucose, Bld 11/03/2018 111* 70 - 99 mg/dL Final   BUN 11/03/2018 12  8 - 23 mg/dL Final   Creatinine 11/03/2018 0.83  0.61 - 1.24 mg/dL Final   Calcium 11/03/2018 10.3  8.9 - 10.3 mg/dL Final   Total Protein 11/03/2018 7.8  6.5 - 8.1 g/dL Final   Albumin 11/03/2018 4.3  3.5  - 5.0 g/dL Final   AST 11/03/2018 23  15 - 41 U/L Final   ALT 11/03/2018 11  0 - 44 U/L Final   Alkaline Phosphatase 11/03/2018 93  38 - 126 U/L Final   Total Bilirubin 11/03/2018 0.6  0.3 - 1.2 mg/dL Final   GFR, Est Non Af Am 11/03/2018 >60  >60 mL/min Final   GFR, Est AFR Am 11/03/2018 >60  >60 mL/min Final   Anion gap 11/03/2018 9  5 - 15 Final   Performed at [facility] at [facility], [address], [place], [place] [address]   Ferritin 11/03/2018 121  24 - 336 ng/mL Final   Performed at [facility], [place] [address] [place], [place] [address]   Iron 11/03/2018 31* 42 - 163 ug/dL Final   TIBC 11/03/2018 284  202 - 409 ug/dL Final   Saturation Ratios 11/03/2018 11* 20 - 55 % Final   UIBC 11/03/2018 253  117 - 376 ug/dL Final   Performed at [facility], [place] [address], [place], [place] [address]   LDH 11/03/2018 385* 98 - 192 U/L Final   Performed at [facility], [place] [address], [place], [place] [address]     X-Rays:Ct Angio Neck W Or Wo Contrast  Result Date: 12/20/2018 CLINICAL DATA:  Carotid stenosis, follow-up EXAM: CT ANGIOGRAPHY NECK TECHNIQUE: Multidetector CT imaging of the neck was performed using the standard protocol during bolus administration of intravenous contrast. Multiplanar CT image reconstructions and MIPs were obtained to evaluate the vascular anatomy. Carotid stenosis measurements (when applicable) are obtained utilizing NASCET criteria, using the distal internal carotid diameter as the denominator. CONTRAST:  24mL OMNIPAQUE IOHEXOL 350 MG/ML SOLN COMPARISON:  None. FINDINGS: Aortic arch: Moderate  calcified plaque along the visualized aortic arch. There is common origin of the innominate and left common carotid arteries. Right carotid system: Common carotid artery is patent with multifocal calcified and noncalcified plaque. There are postsurgical  changes of carotid endarterectomy. There is calcified and noncalcified plaque at the ICA origin causing less than 50% stenosis. Remainder of the cervical ICA is patent. There is no visible opacification of the proximal 2 cm of the external carotid with patency more distally. Left carotid system: Marked calcified plaque at the origin of the common carotid with moderate to marked stenosis. There is multifocal calcified and noncalcified plaque. Primarily calcified plaque is present at the bifurcation and along the proximal internal carotid. There is diminished opacification of the remainder of the cervical ICA with no visible opacification approximately 5 cm from the origin. Stenosis calculation by NASCET criteria is therefore not possible. Minimal luminal diameter at the level of the plaque is less than 2 mm. The intracranial ICA remains unopacified to at least the cavernous portion. ECA is patent. Vertebral arteries: Patent and nearly codominant. Calcified plaque is present at the origins causing mild to moderate stenosis. Skeleton: Multilevel degenerative changes of the included spine. There is fusion of the C4 and C5 vertebral bodies. Other neck: No neck mass or adenopathy. Upper chest: Paraseptal emphysematous changes. IMPRESSION: Postoperative changes of right carotid endarterectomy. Right ICA is patent without significant stenosis. Occlusion of the proximal right ECA. Moderate to marked stenosis at the origin of the left CCA and superimposed calcified plaque at the left ICA origin. There is flow limitation with initially significantly diminished and subsequently lost opacification of the cervical ICA and included proximal intracranial ICA. Electronically Signed   By: Macy Mis M.D.   On: 12/20/2018 14:41   Ct Chest W/ Contrast  Result Date: 12/09/2018 CLINICAL DATA:  Leukocytosis, infection EXAM: CT CHEST, ABDOMEN, AND PELVIS WITH CONTRAST TECHNIQUE: Multidetector CT imaging of the chest, abdomen and  pelvis was performed following the standard protocol during bolus administration of intravenous contrast. CONTRAST:  158mL OMNIPAQUE IOHEXOL 300 MG/ML SOLN, additional oral enteric contrast COMPARISON:  06/20/2010 FINDINGS: CT CHEST FINDINGS Cardiovascular: Aortic atherosclerosis. Status post aortic valve replacement. Cardiomegaly status post median sternotomy with extensive 3 vessel coronary artery calcifications and/or stents. No pericardial effusion. Mediastinum/Nodes: Prominent although not pathologically enlarged mediastinal lymph nodes. Thyroid gland, trachea, and esophagus demonstrate no significant findings. Lungs/Pleura: Mild paraseptal emphysema. No pleural effusion or pneumothorax. Musculoskeletal: No chest wall mass or suspicious bone lesions identified. CT ABDOMEN PELVIS FINDINGS Hepatobiliary: No solid liver abnormality is seen. Hepatic steatosis. Sludge in the gallbladder. No gallbladder wall thickening, or biliary dilatation. Pancreas: Unremarkable. No pancreatic ductal dilatation or surrounding inflammatory changes. Spleen: Splenomegaly, maximum coronal span 16.1 cm. Adrenals/Urinary Tract: Stable, benign right adrenal adenoma. Kidneys are normal, without renal calculi, solid lesion, or hydronephrosis. Distended urinary bladder. Stomach/Bowel: Stomach is within normal limits. Appendix is not clearly visualized and may be surgically absent. No evidence of bowel wall thickening, distention, or inflammatory changes. Vascular/Lymphatic: Aortic atherosclerosis. No enlarged abdominal or pelvic lymph nodes. Reproductive: No mass or other abnormality. Other: No abdominal wall hernia or abnormality. No abdominopelvic ascites. Musculoskeletal: No acute or significant osseous findings. IMPRESSION: 1. No specific CT findings of the chest, abdomen, or pelvis to localize infection. 2.  Splenomegaly, maximum coronal span 16.1 cm. 3.  Distended urinary bladder.  Correlate for urinary retention. 4.  Emphysema  (ICD10-J43.9). 5.  Hepatic steatosis. 6.  Coronary artery disease.  Aortic Atherosclerosis (ICD10-I70.0). Electronically  Signed   By: Eddie Candle M.D.   On: 12/09/2018 15:04   Ct Ap W/ Contrast  Result Date: 12/09/2018 CLINICAL DATA:  Leukocytosis, infection EXAM: CT CHEST, ABDOMEN, AND PELVIS WITH CONTRAST TECHNIQUE: Multidetector CT imaging of the chest, abdomen and pelvis was performed following the standard protocol during bolus administration of intravenous contrast. CONTRAST:  137mL OMNIPAQUE IOHEXOL 300 MG/ML SOLN, additional oral enteric contrast COMPARISON:  06/20/2010 FINDINGS: CT CHEST FINDINGS Cardiovascular: Aortic atherosclerosis. Status post aortic valve replacement. Cardiomegaly status post median sternotomy with extensive 3 vessel coronary artery calcifications and/or stents. No pericardial effusion. Mediastinum/Nodes: Prominent although not pathologically enlarged mediastinal lymph nodes. Thyroid gland, trachea, and esophagus demonstrate no significant findings. Lungs/Pleura: Mild paraseptal emphysema. No pleural effusion or pneumothorax. Musculoskeletal: No chest wall mass or suspicious bone lesions identified. CT ABDOMEN PELVIS FINDINGS Hepatobiliary: No solid liver abnormality is seen. Hepatic steatosis. Sludge in the gallbladder. No gallbladder wall thickening, or biliary dilatation. Pancreas: Unremarkable. No pancreatic ductal dilatation or surrounding inflammatory changes. Spleen: Splenomegaly, maximum coronal span 16.1 cm. Adrenals/Urinary Tract: Stable, benign right adrenal adenoma. Kidneys are normal, without renal calculi, solid lesion, or hydronephrosis. Distended urinary bladder. Stomach/Bowel: Stomach is within normal limits. Appendix is not clearly visualized and may be surgically absent. No evidence of bowel wall thickening, distention, or inflammatory changes. Vascular/Lymphatic: Aortic atherosclerosis. No enlarged abdominal or pelvic lymph nodes. Reproductive: No mass or  other abnormality. Other: No abdominal wall hernia or abnormality. No abdominopelvic ascites. Musculoskeletal: No acute or significant osseous findings. IMPRESSION: 1. No specific CT findings of the chest, abdomen, or pelvis to localize infection. 2.  Splenomegaly, maximum coronal span 16.1 cm. 3.  Distended urinary bladder.  Correlate for urinary retention. 4.  Emphysema (ICD10-J43.9). 5.  Hepatic steatosis. 6.  Coronary artery disease.  Aortic Atherosclerosis (ICD10-I70.0). Electronically Signed   By: Eddie Candle M.D.   On: 12/09/2018 15:04   Dg Pelvis Portable  Result Date: 12/23/2018 CLINICAL DATA:  Postop EXAM: PORTABLE PELVIS 1-2 VIEWS COMPARISON:  CT 12/09/2018 FINDINGS: Status post right hip replacement with intact hardware and normal alignment. Gas in the soft tissues consistent with recent surgery. IMPRESSION: Status post right hip replacement with expected postsurgical change Electronically Signed   By: Donavan Foil M.D.   On: 12/23/2018 16:46   Dg C-arm 1-60 Min-no Report  Result Date: 12/23/2018 Fluoroscopy was utilized by the requesting physician.  No radiographic interpretation.   Dg Hip Operative Unilat W Or W/o Pelvis Right  Result Date: 12/23/2018 CLINICAL DATA:  Right hip replacement EXAM: OPERATIVE right HIP (WITH PELVIS IF PERFORMED) 1 VIEWS TECHNIQUE: Fluoroscopic spot image(s) were submitted for interpretation post-operatively. COMPARISON:  CT 12/09/2018 FINDINGS: Single low resolution intraoperative spot view of the right hip. Total fluoroscopy time was 16 seconds. The images demonstrate an incompletely visualized right hip replacement IMPRESSION: Intraoperative fluoroscopic assistance provided during right hip replacement surgery. Electronically Signed   By: Donavan Foil M.D.   On: 12/23/2018 16:47   Vas US Carotid  Result Date: 12/20/2018 Carotid Arterial Duplex Study Indications:  Carotid artery disease and right endarterectomy. Risk Factors: Hypertension,  hyperlipidemia, Diabetes, past history of smoking,               PAD. Performing Technologist: Caralee Ates BA, RVT, RDMS  Examination Guidelines: A complete evaluation includes B-mode imaging, spectral Doppler, color Doppler, and power Doppler as needed of all accessible portions of each vessel. Bilateral testing is considered an integral part of a complete examination. Limited examinations for reoccurring  indications may be performed as noted.  Right Carotid Findings: +----------+--------+--------+--------+-------------------------+------------+             PSV cm/s EDV cm/s Stenosis Plaque Description        Comments      +----------+--------+--------+--------+-------------------------+------------+  CCA Prox   65       4        <50%     heterogenous and smooth                 +----------+--------+--------+--------+-------------------------+------------+  CCA Mid    36       3        <50%     heterogenous and smooth                 +----------+--------+--------+--------+-------------------------+------------+  CCA Distal 34       6        <50%     heterogenous and smooth   Intimal flap  +----------+--------+--------+--------+-------------------------+------------+  ICA Prox   74       20       1-39%    smooth                                  +----------+--------+--------+--------+-------------------------+------------+  ICA Mid    113      30                                                        +----------+--------+--------+--------+-------------------------+------------+  ICA Distal 91       23                                                        +----------+--------+--------+--------+-------------------------+------------+  ECA        36       10                heterogenous and calcific               +----------+--------+--------+--------+-------------------------+------------+ +----------+--------+-------+---------+-------------------+             PSV cm/s EDV cms Describe  Arm Pressure (mmHG)   +----------+--------+-------+---------+-------------------+  Subclavian 253              Turbulent                      +----------+--------+-------+---------+-------------------+ +---------+--------+--+--------+--+---------+  Vertebral PSV cm/s 53 EDV cm/s 16 Antegrade  +---------+--------+--+--------+--+---------+  Left Carotid Findings: +----------+--------+--------+--------+-------------------------+--------------+             PSV cm/s EDV cm/s Stenosis Plaque Description        Comments        +----------+--------+--------+--------+-------------------------+--------------+  CCA Prox   27       5                 heterogenous and calcific Dampened        +----------+--------+--------+--------+-------------------------+--------------+  CCA Mid    26       7                 heterogenous and  calcific Dampened        +----------+--------+--------+--------+-------------------------+--------------+  CCA Distal 10       6                 heterogenous              Dampened        +----------+--------+--------+--------+-------------------------+--------------+  ICA Prox   48       17                heterogenous and calcific Bi-Directional  +----------+--------+--------+--------+-------------------------+--------------+  ICA Mid    39       13                heterogenous              Bi-Directional  +----------+--------+--------+--------+-------------------------+--------------+  ICA Distal 37       13                                          Bi-Directional  +----------+--------+--------+--------+-------------------------+--------------+  ECA        37       8                 heterogenous and calcific                 +----------+--------+--------+--------+-------------------------+--------------+ +----------+--------+--------+---------+-------------------+             PSV cm/s EDV cm/s Describe  Arm Pressure (mmHG)  +----------+--------+--------+---------+-------------------+  Subclavian 253               Turbulent                       +----------+--------+--------+---------+-------------------+ +---------+--------+--+--------+--+---------+  Vertebral PSV cm/s 77 EDV cm/s 18 Antegrade  +---------+--------+--+--------+--+---------+  Summary: Right Carotid: Velocities in the right ICA are consistent with a 1-39% stenosis.                Non-hemodynamically significant plaque <50% noted in the CCA. The                ECA appears >50% stenosed. Widely patent right carotid                endarterectomy with non-hemodynamically significant plaque                formation.                 Noted proximal to the endarterectomy is a possible small intimal                flap.                 ECA stenosis appears > 50% on 2D and color imaging. Left Carotid: Velocities in the left ICA are consistent with a 1-39% stenosis.               The ECA appears <50% stenosed. Left common carotid artery               demonstrates dampened flow with little to no color fill on color               doppler imaging.                Left ICA demonstrates bi-directional flow.  Left ICA velocities and stenosis grade are likely underestimated               due to dampened CCA flow. Vertebrals:  Bilateral vertebral arteries demonstrate antegrade flow. Subclavians: Bilateral subclavian artery flow was disturbed. *See table(s) above for measurements and observations.  Electronically signed by Monica Martinez MD on 12/20/2018 at 10:59:19 AM.    Final     EKG: Orders placed or performed during the hospital encounter of 08/09/18   ED EKG   ED EKG   EKG   EKG 12-Lead   EKG 12-Lead     Hospital Course: Reinhold Atkerson is a 63 y.o. who was admitted to [facility]. They were brought to the operating room on 12/23/2018 and underwent Procedure(s): Anton Ruiz.  Patient tolerated the procedure well and was later transferred to the recovery room and then to the orthopaedic floor for postoperative care. They  were given PO and IV analgesics for pain control following their surgery. They were given 24 hours of postoperative antibiotics of  Anti-infectives (From admission, onward)   Start     Dose/Rate Route Frequency Ordered Stop   12/23/18 2100  ceFAZolin (ANCEF) IVPB 2g/100 mL premix     2 g 200 mL/hr over 30 Minutes Intravenous Every 6 hours 12/23/18 1822 12/24/18 0246   12/23/18 1115  ceFAZolin (ANCEF) IVPB 2g/100 mL premix     2 g 200 mL/hr over 30 Minutes Intravenous On call to O.R. 12/23/18 1113 12/23/18 1439     and started on DVT prophylaxis in the form of Aspirin.   PT and OT were ordered for total joint protocol. Discharge planning consulted to help with postop disposition and equipment needs.  Patient had a good night on the evening of surgery. They started to get up OOB with therapy on POD #1. Pt was seen during rounds and was ready to go home pending progress with therapy. He worked with therapy on POD #1 and was meeting his goals. Pt was discharged to home later that day in stable condition.  Diet: Regular diet Activity: WBAT Follow-up: in 2 weeks Disposition: Home Discharged Condition: good   Discharge Instructions    Call MD / Call 911   Complete by: As directed    If you experience chest pain or shortness of breath, CALL 911 and be transported to the hospital emergency room.  If you develope a fever above 101 F, pus (white drainage) or increased drainage or redness at the wound, or calf pain, call your surgeon's office.   Change dressing   Complete by: As directed    Maintain surgical dressing until follow up in the clinic. If the edges start to pull up, may reinforce with tape. If the dressing is no longer working, may remove and cover with gauze and tape, but must keep the area dry and clean.  Call with any questions or concerns.   Constipation Prevention   Complete by: As directed    Drink plenty of fluids.  Prune juice may be helpful.  You may use a stool softener, such as  Colace (over the counter) 100 mg twice a day.  Use MiraLax (over the counter) for constipation as needed.   Diet - low sodium heart healthy   Complete by: As directed    Discharge instructions   Complete by: As directed    Maintain surgical dressing until follow up in the clinic. If the edges start to pull up, may reinforce  with tape. If the dressing is no longer working, may remove and cover with gauze and tape, but must keep the area dry and clean.  Follow up in 2 weeks at [facility]. Call with any questions or concerns.   Increase activity slowly as tolerated   Complete by: As directed    Weight bearing as tolerated with assist device (walker, cane, etc) as directed, use it as long as suggested by your surgeon or therapist, typically at least 4-6 weeks.   TED hose   Complete by: As directed    Use stockings (TED hose) for 2 weeks on both leg(s).  You may remove them at night for sleeping.     Allergies as of 12/24/2018      Reactions   Codeine Swelling   Tolerates hydrocodone, tramadol, and oxycodone       Medication List    STOP taking these medications   acetaminophen 650 MG CR tablet Commonly known as: TYLENOL   ASPERCREME LIDOCAINE EX   HYDROcodone-acetaminophen 5-325 MG tablet Commonly known as: NORCO/VICODIN Replaced by: HYDROcodone-acetaminophen 7.5-325 MG tablet   THERAWORX RELIEF EX     TAKE these medications   amoxicillin 500 MG capsule Commonly known as: AMOXIL Take 1 capsule (500 mg total) by mouth once as needed for up to 4 doses. Take four 500 mg capsules by mouth 1 hour before dental visits   apixaban 5 MG Tabs tablet Commonly known as: Eliquis Take 1 tablet (5 mg total) by mouth 2 (two) times daily.   aspirin 81 MG tablet Take 81 mg by mouth daily.   BLUE-EMU PAIN RELIEF DRY EX Apply 1 application topically daily as needed (pain).   Centrum Adults Tabs Take 1 capsule by mouth daily.   Cholecalciferol 125 MCG (5000 UT) capsule Take  5,000 Units by mouth daily.   cyclobenzaprine 10 MG tablet Commonly known as: FLEXERIL Take 10 mg by mouth 3 (three) times daily.   ferrous sulfate 325 (65 FE) MG tablet Take 1 tablet (325 mg total) by mouth 2 (two) times daily with a meal.   fluticasone 50 MCG/ACT nasal spray Commonly known as: FLONASE Place 2 sprays into both nostrils daily.   furosemide 20 MG tablet Commonly known as: LASIX Take 20 mg by mouth daily as needed for fluid.   HYDROcodone-acetaminophen 7.5-325 MG tablet Commonly known as: NORCO Take 1-2 tablets by mouth every 4 (four) hours as needed for severe pain (pain score 7-10). Replaces: HYDROcodone-acetaminophen 5-325 MG tablet   loratadine 10 MG tablet Commonly known as: CLARITIN Take 10 mg by mouth daily.   metFORMIN 500 MG 24 hr tablet Commonly known as: GLUCOPHAGE-XR Take 500 mg by mouth daily.   methocarbamol 500 MG tablet Commonly known as: ROBAXIN Take 1 tablet (500 mg total) by mouth every 6 (six) hours as needed for muscle spasms.   metoprolol succinate 25 MG 24 hr tablet Commonly known as: Toprol XL Take 0.5 tablets (12.5 mg total) by mouth daily. What changed: how much to take   nefazodone 100 MG tablet Commonly known as: SERZONE Take 100 mg by mouth 2 (two) times daily.   pantoprazole 40 MG tablet Commonly known as: PROTONIX Take 1 tablet (40 mg total) by mouth daily. What changed: when to take this   potassium chloride 10 MEQ CR capsule Commonly known as: MICRO-K Take 10 mEq by mouth daily as needed (take when taking lasix).   pregabalin 50 MG capsule Commonly known as: LYRICA Take 50 mg by mouth  3 (three) times daily.   REFRESH TEARS OP Place 1-2 drops into both eyes daily as needed (dry eyes).   rosuvastatin 20 MG tablet Commonly known as: CRESTOR Take 1 tablet (20 mg total) by mouth daily. What changed: when to take this   zolpidem 10 MG tablet Commonly known as: AMBIEN Take 10 mg by mouth at bedtime.              Discharge Care Instructions  (From admission, onward)         Start     Ordered   12/24/18 0000  Change dressing    Comments: Maintain surgical dressing until follow up in the clinic. If the edges start to pull up, may reinforce with tape. If the dressing is no longer working, may remove and cover with gauze and tape, but must keep the area dry and clean.  Call with any questions or concerns.   12/24/18 1040         Follow-up Information    Paralee Cancel, MD. Schedule an appointment as soon as possible for a visit in 2 weeks.   Specialty: Orthopedic Surgery Contact information: [address] [place] [address] [id number]           Signed: Griffith Citron, PA-C Orthopedic Surgery 12/26/2018, 1:36 PM

## 2018-12-27 ENCOUNTER — Other Ambulatory Visit: Payer: Self-pay | Admitting: Family

## 2018-12-29 ENCOUNTER — Telehealth: Payer: Self-pay | Admitting: Vascular Surgery

## 2018-12-29 NOTE — Telephone Encounter (Signed)
63 year old male well-known to me who previously had a right carotid endarterectomy last year.  We recently got a CTA to evaluate the left carotid given it was previously thought to be occluded based on old duplex but recent duplex in clinic suggested there was some flow proximal.  I reviewed CTA and he has a high-grade proximal lesion in the common carotid as well as a high-grade lesion at the bifurcation.  That being said, difficult to follow contrast in the ICA beyond the midportion of the artery and certainly no evidence of flow at the skull base.  Discussed with him that I would not offer left carotid endarterectomy without a carotid angiogram to confirm that there is indeed flow otherwise there would probably be no benefit to surgery.  Ultimately discussed other option would be medical management with continued surveillance.  He is in favor of medical management for now.  He is already on aspirin Eliquis and a statin.  I will arrange follow-up with me in 6 months.  Marty Heck, MD [facility] Office: [phone] Pager: [place]

## 2019-01-02 ENCOUNTER — Telehealth: Payer: Self-pay | Admitting: Hematology

## 2019-01-02 ENCOUNTER — Inpatient Hospital Stay: Payer: BC Managed Care – PPO

## 2019-01-02 ENCOUNTER — Inpatient Hospital Stay: Payer: BC Managed Care – PPO | Admitting: Hematology

## 2019-01-02 NOTE — Telephone Encounter (Signed)
Called and spoke with patient about r/s appts from 12/14 until next week. I also advised him that he needed to contact his Orthopaedic regarding low grade fever after total hip replacement surgery recently. Per 12/14 staff message

## 2019-01-10 ENCOUNTER — Ambulatory Visit (INDEPENDENT_AMBULATORY_CARE_PROVIDER_SITE_OTHER): Payer: BC Managed Care – PPO | Admitting: Cardiology

## 2019-01-10 ENCOUNTER — Encounter: Payer: Self-pay | Admitting: Cardiology

## 2019-01-10 ENCOUNTER — Other Ambulatory Visit: Payer: Self-pay

## 2019-01-10 ENCOUNTER — Other Ambulatory Visit: Payer: Self-pay | Admitting: *Deleted

## 2019-01-10 VITALS — BP 117/69 | HR 84 | Temp 96.6°F | Ht 68.0 in | Wt 199.2 lb

## 2019-01-10 DIAGNOSIS — Z951 Presence of aortocoronary bypass graft: Secondary | ICD-10-CM | POA: Diagnosis not present

## 2019-01-10 DIAGNOSIS — I2583 Coronary atherosclerosis due to lipid rich plaque: Secondary | ICD-10-CM

## 2019-01-10 DIAGNOSIS — Z952 Presence of prosthetic heart valve: Secondary | ICD-10-CM

## 2019-01-10 DIAGNOSIS — I2510 Atherosclerotic heart disease of native coronary artery without angina pectoris: Secondary | ICD-10-CM | POA: Diagnosis not present

## 2019-01-10 DIAGNOSIS — I4821 Permanent atrial fibrillation: Secondary | ICD-10-CM

## 2019-01-10 DIAGNOSIS — I739 Peripheral vascular disease, unspecified: Secondary | ICD-10-CM

## 2019-01-10 DIAGNOSIS — I6523 Occlusion and stenosis of bilateral carotid arteries: Secondary | ICD-10-CM

## 2019-01-10 DIAGNOSIS — Z9889 Other specified postprocedural states: Secondary | ICD-10-CM

## 2019-01-10 DIAGNOSIS — I10 Essential (primary) hypertension: Secondary | ICD-10-CM

## 2019-01-10 NOTE — Patient Instructions (Signed)
Medication Instructions:  Your Physician recommend you continue on your current medication as directed.    *If you need a refill on your cardiac medications before your next appointment, please call your pharmacy*  Lab Work: None  Testing/Procedures: None  Follow-Up: At [facility], you and your health needs are our priority.  As part of our continuing mission to provide you with exceptional heart care, we have created designated Provider Care Teams.  These Care Teams include your primary Cardiologist (physician) and Advanced Practice Providers (APPs -  Physician Assistants and Nurse Practitioners) who all work together to provide you with the care you need, when you need it.  Your next appointment:   6 month(s)  The format for your next appointment:   In Person  Provider:   Bridgette Christopher, MD   

## 2019-01-10 NOTE — Progress Notes (Signed)
Cardiology Office Note:    Date:  01/10/2019   ID:  [name], DOB 08/08/1955, MRN [id number]  PCP:  [name], MD  Cardiologist:  Buford Dresser, MD PhD  Referring MD: [name], MD   CC: follow up  History of Present Illness:    Justin [name] is a 63 y.o. male with a hx of obesity, diabetes, prior tobacco use, hypertension, peripheral vascular disease who is seen in follow up for atrial fibrillation and s/p R CEA and CABG with AVR 01/03/18 for bicuspid aortic valve stenosis. My initial visit with him was on 12/07/17.   Pertinent PMH -atrial fibrillation diagnosed 2019 -R CEA, CABG, AVR (bicuspid valve) 12/2017. -Admission 08/13/2018 for severe anemia 2/2 gastric ulcers -PAD: 30 mmHg difference between arms, known stenotic disease on dopplers from 12/2017. Noted to have severely dampened flow throughout. L brachial pressures also noted as severely reduced from R arm at the time of dopplers. Remains with claudication, though trying to walk through it. R leg with numbness from vein harvest.  -S/P THA 12/24/2018 -essential thrombocythemia/JAK mutation, followed by Dr. Maylon Peppers, on hydrea  Today: Doing very well post hip repair. Bounced back quickly. No chest pain. Cannot feel his afib. No shortness of breath. Feels the best he has felt in a while. No bleeding issues, reviewed below. Post surgical RLE swelling improving.  Denies chest pain, shortness of breath at rest or with normal exertion. No PND, orthopnea, LE edema or unexpected weight gain. No syncope or palpitations.  Past Medical History:  Diagnosis Date  . Acute meniscal tear of knee LEFT  . Anemia   . Aortic stenosis 12/01/2017   NONRHEUMATIC, AORTIC VALVE CALCIFICATIONS, MILD TO MODERATE REGURG, MILD TO MODERATE CALCIFIED ANNULUS per ECHO 10/25/17 @ [facility]  . Arthritis   . Atrial fibrillation ([place]) 11/12/2017   AT O/V WITH PCP  . Cancer ([place])    skin right arm  . Coronary artery  disease   . DM (diabetes mellitus) ([place])   . Heart murmur MILD-- ASYMPTOMATIC  . History of kidney stones   . Hyperlipidemia   . Hypertension   . Left knee pain   . PAD (peripheral artery disease) ([facility])    left leg claudication    Past Surgical History:  Procedure Laterality Date  . AORTIC VALVE REPLACEMENT N/A 01/03/2018   Procedure: AORTIC VALVE REPLACEMENT (AVR) using 60mm Magna Ease Bioprosthesis Aortic Valve;  Surgeon: Ivin Poot, MD;  Location: [place];  Service: Open Heart Surgery;  Laterality: N/A;  . APPENDECTOMY  1998  . BIOPSY  08/11/2018   Procedure: BIOPSY;  Surgeon: Mauri Pole, MD;  Location: [facility] [facility];  Service: Endoscopy;;  . CATARACT EXTRACTION W/ INTRAOCULAR LENS  IMPLANT, BILATERAL  1998/  2000  . CERVICAL FUSION  1985   C4 - 5  . CORONARY ARTERY BYPASS GRAFT N/A 01/03/2018   Procedure: CORONARY ARTERY BYPASS GRAFTING (CABG) x 4 (LIMA to LAD, SVG to DIAGONAL, SVG to RAMUS INTERMEDIATE, and SVG to PDA), USING LEFT INFTERNAL MAMMARY ARTERY AND;  Surgeon: Prescott Gum, Collier Salina, MD;  Location: [facility];  Service: Open Heart Surgery;  Laterality: N/A;  . ENDARTERECTOMY Right 01/03/2018   Procedure: ENDARTERECTOMY CAROTID;  Surgeon: Marty Heck, MD;  Location: [facility] OR;  Service: Vascular;  Laterality: Right;  . ESOPHAGOGASTRODUODENOSCOPY (EGD) WITH PROPOFOL N/A 08/11/2018   Procedure: ESOPHAGOGASTRODUODENOSCOPY (EGD) WITH PROPOFOL;  Surgeon: Mauri Pole, MD;  Location: [place];  Service: Endoscopy;  Laterality: N/A;  .  KNEE ARTHROSCOPY W/ MENISCECTOMY  1991  1987   LEFT KNEE   x 2  . NASAL SINUS SURGERY  1982  . RIGHT/LEFT HEART CATH AND CORONARY ANGIOGRAPHY N/A 12/24/2017   Procedure: RIGHT/LEFT HEART CATH AND CORONARY ANGIOGRAPHY;  Surgeon: Jolaine Artist, MD;  Location: [place] [facility];  Service: Cardiovascular;  Laterality: N/A;  . ROTATOR CUFF REPAIR  09-04-2005   LEFT SHOULDER  . TEE WITHOUT CARDIOVERSION N/A 12/24/2017    Procedure: TRANSESOPHAGEAL ECHOCARDIOGRAM (TEE);  Surgeon: Jolaine Artist, MD;  Location: [facility];  Service: Cardiovascular;  Laterality: N/A;  . TEE WITHOUT CARDIOVERSION N/A 01/03/2018   Procedure: TRANSESOPHAGEAL ECHOCARDIOGRAM (TEE);  Surgeon: Prescott Gum, Collier Salina, MD;  Location: [place];  Service: Open Heart Surgery;  Laterality: N/A;  . TONSILLECTOMY  AGE 55  . TOTAL HIP ARTHROPLASTY Right 12/23/2018   Procedure: TOTAL HIP ARTHROPLASTY ANTERIOR APPROACH;  Surgeon: Paralee Cancel, MD;  Location: [facility];  Service: Orthopedics;  Laterality: Right;  70 mins    Current Medications: Current Outpatient Medications on File Prior to Visit  Medication Sig  . amoxicillin (AMOXIL) 500 MG capsule Take 1 capsule (500 mg total) by mouth once as needed for up to 4 doses. Take four 500 mg capsules by mouth 1 hour before dental visits  . apixaban (ELIQUIS) 5 MG TABS tablet Take 1 tablet (5 mg total) by mouth 2 (two) times daily.  Marland Kitchen aspirin 81 MG tablet Take 81 mg by mouth daily.   . Carboxymethylcellulose Sodium (REFRESH TEARS OP) Place 1-2 drops into both eyes daily as needed (dry eyes).  . Cholecalciferol 125 MCG (5000 UT) capsule Take 5,000 Units by mouth daily.   . cyclobenzaprine (FLEXERIL) 10 MG tablet Take 10 mg by mouth 3 (three) times daily.   . ferrous sulfate 325 (65 FE) MG tablet Take 1 tablet (325 mg total) by mouth 2 (two) times daily with a meal.  . fluticasone (FLONASE) 50 MCG/ACT nasal spray Place 2 sprays into both nostrils daily.  . furosemide (LASIX) 20 MG tablet Take 20 mg by mouth daily as needed for fluid.   Marland Kitchen HYDROcodone-acetaminophen (NORCO) 7.5-325 MG tablet Take 1-2 tablets by mouth every 4 (four) hours as needed for severe pain (pain score 7-10).  . hydroxyurea (HYDREA) 500 MG capsule TAKE 1 CAPSULE BY MOUTH 2 TIMES DAILY. MAY TAKE WITH FOOD TO MINIMIZE GI SIDE EFFECTS.  Marland Kitchen Lidocaine (BLUE-EMU PAIN RELIEF DRY EX) Apply 1 application topically daily as needed (pain).  Marland Kitchen  loratadine (CLARITIN) 10 MG tablet Take 10 mg by mouth daily.  . metFORMIN (GLUCOPHAGE-XR) 500 MG 24 hr tablet Take 500 mg by mouth daily.   . methocarbamol (ROBAXIN) 500 MG tablet Take 1 tablet (500 mg total) by mouth every 6 (six) hours as needed for muscle spasms.  . metoprolol succinate (TOPROL XL) 25 MG 24 hr tablet Take 0.5 tablets (12.5 mg total) by mouth daily. (Patient taking differently: Take 25 mg by mouth daily. )  . Multiple Vitamins-Minerals (CENTRUM ADULTS) TABS Take 1 capsule by mouth daily.  . nefazodone (SERZONE) 100 MG tablet Take 100 mg by mouth 2 (two) times daily.  . pantoprazole (PROTONIX) 40 MG tablet Take 1 tablet (40 mg total) by mouth daily. (Patient taking differently: Take 40 mg by mouth every evening. )  . potassium chloride (MICRO-K) 10 MEQ CR capsule Take 10 mEq by mouth daily as needed (take when taking lasix).  . pregabalin (LYRICA) 50 MG capsule Take 50 mg by mouth 3 (  three) times daily.  . rosuvastatin (CRESTOR) 20 MG tablet Take 1 tablet (20 mg total) by mouth daily. (Patient taking differently: Take 20 mg by mouth every evening. )  . zolpidem (AMBIEN) 10 MG tablet Take 10 mg by mouth at bedtime.    No current facility-administered medications on file prior to visit.    Allergies:   Codeine   Social History   Tobacco Use  . Smoking status: Former Smoker    Packs/day: 0.25    Years: 23.00    Pack years: 5.75    Types: Cigars    Quit date: 01/02/2018    Years since quitting: 1.0  . Smokeless tobacco: Never Used  . Tobacco comment: quit cigs in 2011 and smokes cigars daily- from 3-10 cigars-09/27/13  Substance Use Topics  . Alcohol use: Not Currently    Alcohol/week: 0.0 standard drinks    Comment: occasional  . Drug use: No    Family History: Family history unknown as he was adopted.  ROS:   Please see the history of present illness.  Additional pertinent ROS: Constitutional: Negative for chills, fever, night sweats, unintentional weight loss    HENT: Negative for ear pain and hearing loss.   Eyes: Negative for loss of vision and eye pain.  Respiratory: Negative for cough, sputum, wheezing.   Cardiovascular: See HPI. Gastrointestinal: Negative for abdominal pain, melena, and hematochezia.  Genitourinary: Negative for dysuria and hematuria.  Musculoskeletal: Negative for falls and myalgias.  Skin: Negative for itching and rash.  Neurological: Negative for focal weakness, focal sensory changes and loss of consciousness.  Endo/Heme/Allergies: Does bruise/bleed easily.   EKGs/Labs/Other Studies Reviewed:    The following studies were reviewed today:  Echo 08/01/18  1. The left ventricle has normal systolic function with an ejection fraction of 60-65%. The cavity size was normal. There is mildly increased left ventricular wall thickness. Left ventricular diastolic Doppler parameters are indeterminate.  2. The right ventricle has normal systolic function.  3. Left atrial size was moderately dilated.  4. A 31mm an [employer] valve is present in the aortic position. Procedure Date: 01/03/18.  5. Aortic valve prosthesis appears to open well.  Echo 10/25/17 - Left ventricle: Wall thickness was increased in a pattern of mild   LVH. Systolic function was mildly to moderately reduced. The   estimated ejection fraction was in the range of 40% to 45%. - Aortic valve: Mildly to moderately calcified annulus. Mildly   thickened, moderately calcified leaflets. There was mild to   moderate regurgitation. Valve area (VTI): 0.93 cm^2. Valve area   (Vmax): 0.93 cm^2. Valve area (Vmean): 0.9 cm^2. - Left atrium: The atrium was mildly dilated. - Right atrium: The atrium was mildly dilated.  TEE 12/24/17 - Left ventricle: The estimated ejection fraction was 55%. - Aortic valve: Trileaflet; moderately calcified leaflets; fusion   of the left-noncoronary commissure. There was moderate stenosis. - Mitral valve: No evidence of vegetation.  There was mild to   moderate regurgitation. - Left atrium: The atrium was dilated. No evidence of thrombus in   the atrial cavity or appendage. - Right atrium: The atrium was dilated. - Atrial septum: No defect or patent foramen ovale was identified. - Tricuspid valve: No evidence of vegetation. - Pulmonic valve: No evidence of vegetation.  [facility] 12/24/17  Ost RCA to Prox RCA lesion is 95% stenosed.  Prox LAD lesion is 95% stenosed.  Mid RCA lesion is 80% stenosed.  Dist RCA lesion is 99% stenosed.  Post Atrio lesion is 50% stenosed.  Prox Cx to Mid Cx lesion is 80% stenosed.  Mid LM to Dist LM lesion is 50% stenosed.  Ost 1st Diag lesion is 90% stenosed.  There is moderate aortic valve stenosis.   Findings:  RA = 6 RV = 34/5 PA = 34/9 (20) PCW = 21 Fick cardiac output/index = 5.5/2.7 PVR = < 1.0 WU Ao sat = 97% PA sat = 73%, 74%  Assessment: 1. Moderate aortic stenosis 2. Severe 3v CAD 3. Normal LV function 4. Relatively normal hemodynamics Plan/Discussion: Films reviewed with Dr. Prescott Gum. Plan AVR/CABG +/- Maze with Dr. Prescott Gum next week.   Surgery 01/03/18 Procedure (s):  1. Coronary artery bypass grafting x4 (left internal mammary artery to left anterior descending, saphenous vein graft to diagonal, saphenous vein graft to OM1, saphenous vein graft to posterior descending). 2. Aortic valve replacement with a 23 mm Edwards pericardial valve, serial [id number]. 3. Application of left atrial clip, AtriCure 35 mm. 4. Endoscopic harvest of right leg greater saphenous vein by Dr. Prescott Gum on 01/03/2018.  Also R CEA with bovine patch angioplasty  EKG:  EKG is personally reviewed.  The ekg ordered 08/10/18 demonstrates atrial fibrillation RVR at 113 bpm  Recent Labs: 12/21/2018: ALT 9 12/24/2018: BUN 10; Creatinine, Ser 0.58; Hemoglobin 11.6; Platelets 250; Potassium 4.0; Sodium 133  Recent Lipid Panel    Component Value Date/Time   CHOL 127  02/17/2018 1014   TRIG 80 02/17/2018 1014   HDL 41 02/17/2018 1014   CHOLHDL 3.1 02/17/2018 1014   VLDL 16 02/17/2018 1014   LDLCALC 70 02/17/2018 1014    Physical Exam:    VS:  BP 117/69   Pulse 84   Temp (!) 96.6 F (35.9 C)   Ht 5\' 8"  (1.727 m)   Wt 199 lb 3.2 oz (90.4 kg)   SpO2 96%   BMI 30.29 kg/m     Wt Readings from Last 3 Encounters:  01/10/19 199 lb 3.2 oz (90.4 kg)  12/24/18 219 lb 12.8 oz (99.7 kg)  12/21/18 198 lb (89.8 kg)    GEN: Well nourished, well developed in no acute distress HEENT: Normal, moist mucous membranes NECK: No JVD CARDIAC: irregular rhythm, normal S1 and S2, no rubs or gallops. 2/6 SEM bilateral upper sternal border VASCULAR: Radial pulses 2+ bilaterally. No carotid bruits RESPIRATORY:  Clear to auscultation without rales, wheezing or rhonchi  ABDOMEN: Soft, non-tender, non-distended MUSCULOSKELETAL:  Ambulates independently using walker SKIN: Warm and dry. RLE 1+ edema, LLE no edema NEUROLOGIC:  Alert and oriented x 3. No focal neuro deficits noted. PSYCHIATRIC:  Normal affect   ASSESSMENT:    1. Coronary artery disease due to lipid rich plaque   2. S/P CABG x 4   3. Permanent atrial fibrillation ([place])   4. History of CEA (carotid endarterectomy)   5. S/P AVR (aortic valve replacement)   6. PAD (peripheral artery disease) ([place])   7. Essential hypertension    PLAN:    S/P R CEA, CAD s/p CABG x4, AVR (for bicuspid AV stenosis) 12/2017:  -continue metoprolol succinate 12.5 mg daily. Had been on higher dose, but BP (especially in lowest BP arm) running low.  -on 81 mg aspirin for valve and CAD -continue rosuvastatin 20 mg (no atorvastatin with interaction w/nefazodone). -had genital infection on jardiance. Consider GLP1RA for diabets/ASCVD benefit  -has endocarditis prophylaxis antibiotics (amoxicillin) -repeat echo with EF 60-65%, valve functioning well  Atrial fibrillation, permanent: goal of  rate control  -chadsvasc=3, s/p LA  clip. His polycythemia also places him at higher risk of clot as well.  -Continue rate control with metoprolol, anticoagulation with apixaban  PAD: stable symptoms -quit smoking. On aspirin and statin. ABIs showed moderate disease on the right, severe disease on the left leg with diffusely reduced flow and arm differential pressures. Continue to follow  Hypertension: at goal, avoid hypotension give PAD pressure differences -continue metoprolol  Follow up 6 mos or sooner PRN  Medication Adjustments/Labs and Tests Ordered: Current medicines are reviewed at length with the patient today.  Concerns regarding medicines are outlined above.  No orders of the defined types were placed in this encounter.  No orders of the defined types were placed in this encounter.   Patient Instructions  Medication Instructions:  Your Physician recommend you continue on your current medication as directed.    *If you need a refill on your cardiac medications before your next appointment, please call your pharmacy*  Lab Work: None  Testing/Procedures: None  Follow-Up: At [facility], you and your health needs are our priority.  As part of our continuing mission to provide you with exceptional heart care, we have created designated Provider Care Teams.  These Care Teams include your primary Cardiologist (physician) and Advanced Practice Providers (APPs -  Physician Assistants and Nurse Practitioners) who all work together to provide you with the care you need, when you need it.  Your next appointment:   6 month(s)  The format for your next appointment:   In Person  Provider:   Buford Dresser, MD     Signed, Buford Dresser, MD PhD 01/10/2019    [place]

## 2019-01-11 ENCOUNTER — Inpatient Hospital Stay: Payer: BC Managed Care – PPO

## 2019-01-11 ENCOUNTER — Inpatient Hospital Stay (HOSPITAL_BASED_OUTPATIENT_CLINIC_OR_DEPARTMENT_OTHER): Payer: BC Managed Care – PPO | Admitting: Family

## 2019-01-11 ENCOUNTER — Encounter: Payer: Self-pay | Admitting: Family

## 2019-01-11 VITALS — BP 113/66 | HR 99 | Temp 97.5°F | Resp 18 | Ht 68.0 in | Wt 199.0 lb

## 2019-01-11 DIAGNOSIS — D473 Essential (hemorrhagic) thrombocythemia: Secondary | ICD-10-CM | POA: Diagnosis not present

## 2019-01-11 DIAGNOSIS — D45 Polycythemia vera: Secondary | ICD-10-CM

## 2019-01-11 DIAGNOSIS — D75839 Thrombocytosis, unspecified: Secondary | ICD-10-CM

## 2019-01-11 LAB — CMP (CANCER CENTER ONLY)
ALT: 11 U/L (ref 0–44)
AST: 21 U/L (ref 15–41)
Albumin: 3.5 g/dL (ref 3.5–5.0)
Alkaline Phosphatase: 105 U/L (ref 38–126)
Anion gap: 10 (ref 5–15)
BUN: 7 mg/dL — ABNORMAL LOW (ref 8–23)
CO2: 28 mmol/L (ref 22–32)
Calcium: 9.2 mg/dL (ref 8.9–10.3)
Chloride: 97 mmol/L — ABNORMAL LOW (ref 98–111)
Creatinine: 0.71 mg/dL (ref 0.61–1.24)
GFR, Est AFR Am: >60 mL/min (ref >60)
GFR, Estimated: >60 mL/min (ref >60)
Glucose, Bld: 154 mg/dL — ABNORMAL HIGH (ref 70–99)
Potassium: 3.9 mmol/L (ref 3.5–5.1)
Sodium: 135 mmol/L (ref 135–145)
Total Bilirubin: 1 mg/dL (ref 0.3–1.2)
Total Protein: 7.1 g/dL (ref 6.5–8.1)

## 2019-01-11 LAB — CBC WITH DIFFERENTIAL (CANCER CENTER ONLY)
Abs Immature Granulocytes: 3.7 10*3/uL — ABNORMAL HIGH (ref 0.00–0.07)
Basophils Absolute: 0.4 10*3/uL — ABNORMAL HIGH (ref 0.0–0.1)
Basophils Relative: 1 %
Eosinophils Absolute: 0.1 10*3/uL (ref 0.0–0.5)
Eosinophils Relative: 0 %
HCT: 35 % — ABNORMAL LOW (ref 39.0–52.0)
Hemoglobin: 11.2 g/dL — ABNORMAL LOW (ref 13.0–17.0)
Immature Granulocytes: 9 %
Lymphocytes Relative: 3 %
Lymphs Abs: 1.2 10*3/uL (ref 0.7–4.0)
MCH: 33.1 pg (ref 26.0–34.0)
MCHC: 32 g/dL (ref 30.0–36.0)
MCV: 103.6 fL — ABNORMAL HIGH (ref 80.0–100.0)
Monocytes Absolute: 2.1 10*3/uL — ABNORMAL HIGH (ref 0.1–1.0)
Monocytes Relative: 5 %
Neutro Abs: 32.5 10*3/uL — ABNORMAL HIGH (ref 1.7–7.7)
Neutrophils Relative %: 82 %
Platelet Count: 392 10*3/uL (ref 150–400)
RBC: 3.38 MIL/uL — ABNORMAL LOW (ref 4.22–5.81)
RDW: 21.3 % — ABNORMAL HIGH (ref 11.5–15.5)
WBC Count: 40.1 10*3/uL — ABNORMAL HIGH (ref 4.0–10.5)
nRBC: 0.1 % (ref 0.0–0.2)

## 2019-01-11 LAB — LACTATE DEHYDROGENASE: LDH: 568 U/L — ABNORMAL HIGH (ref 98–192)

## 2019-01-11 NOTE — Progress Notes (Signed)
Hematology and Oncology Follow Up Visit  Justin [name] [id number] [birth year] 63 y.o. 01/11/2019   Principle Diagnosis:  Essential thrombocythemia, JAK2 V617 mutation+; high risk (age >64)  Current Therapy:   Hydrea 500 mg BID alternated with 500 mg daily   Interim History:  Justin [name] had a telephone visit today for follow-up. He is doing well and recuperating from his total right hip replacement. He has had some muscle spasms and cramping but states that the Robaxin helps with this.  He has had no issue with bleeding. No bruising or petechiae.   WBC count is up slightly at 40, Hgb 11.2 and platelet count 392.  He states that he is alternating 500 mg PO BID with 500 mg PO daily of Hydrea.  He has had no fever, chills, n/v, cough, rash, dizziness, SOB, chest pain, palpitations, abdominal pain or changes in bowel or bladder habits.  He states that he was able to see vascular yesterday and got a good report.  No numbness or tingling in his extremities.  He has had no falls or syncope.  He states that his appetite is good and he is staying well hydrated. His weight is stable.   ECOG Performance Status: 1 - Symptomatic but completely ambulatory  Medications:  Allergies as of 01/11/2019      Reactions   Codeine Swelling   Tolerates hydrocodone, tramadol, and oxycodone       Medication List       Accurate as of January 11, 2019 10:36 AM. If you have any questions, ask your nurse or doctor.        amoxicillin 500 MG capsule Commonly known as: AMOXIL Take 1 capsule (500 mg total) by mouth once as needed for up to 4 doses. Take four 500 mg capsules by mouth 1 hour before dental visits   apixaban 5 MG Tabs tablet Commonly known as: Eliquis Take 1 tablet (5 mg total) by mouth 2 (two) times daily.   aspirin 81 MG tablet Take 81 mg by mouth daily.   BLUE-EMU PAIN RELIEF DRY EX Apply 1 application topically daily as needed (pain).   Centrum Adults Tabs Take 1 capsule by  mouth daily.   Cholecalciferol 125 MCG (5000 UT) capsule Take 5,000 Units by mouth daily.   cyclobenzaprine 10 MG tablet Commonly known as: FLEXERIL Take 10 mg by mouth 3 (three) times daily.   ferrous sulfate 325 (65 FE) MG tablet Take 1 tablet (325 mg total) by mouth 2 (two) times daily with a meal.   fluticasone 50 MCG/ACT nasal spray Commonly known as: FLONASE Place 2 sprays into both nostrils daily.   furosemide 20 MG tablet Commonly known as: LASIX Take 20 mg by mouth daily as needed for fluid.   HYDROcodone-acetaminophen 7.5-325 MG tablet Commonly known as: NORCO Take 1-2 tablets by mouth every 4 (four) hours as needed for severe pain (pain score 7-10).   hydroxyurea 500 MG capsule Commonly known as: HYDREA TAKE 1 CAPSULE BY MOUTH 2 TIMES DAILY. MAY TAKE WITH FOOD TO MINIMIZE GI SIDE EFFECTS.   loratadine 10 MG tablet Commonly known as: CLARITIN Take 10 mg by mouth daily.   metFORMIN 500 MG 24 hr tablet Commonly known as: GLUCOPHAGE-XR Take 500 mg by mouth daily.   methocarbamol 500 MG tablet Commonly known as: ROBAXIN Take 1 tablet (500 mg total) by mouth every 6 (six) hours as needed for muscle spasms.   metoprolol succinate 25 MG 24 hr tablet Commonly known as: Toprol XL  Take 0.5 tablets (12.5 mg total) by mouth daily. What changed: how much to take   nefazodone 100 MG tablet Commonly known as: SERZONE Take 100 mg by mouth 2 (two) times daily.   pantoprazole 40 MG tablet Commonly known as: PROTONIX Take 1 tablet (40 mg total) by mouth daily. What changed: when to take this   potassium chloride 10 MEQ CR capsule Commonly known as: MICRO-K Take 10 mEq by mouth daily as needed (take when taking lasix).   pregabalin 50 MG capsule Commonly known as: LYRICA Take 50 mg by mouth 3 (three) times daily.   REFRESH TEARS OP Place 1-2 drops into both eyes daily as needed (dry eyes).   rosuvastatin 20 MG tablet Commonly known as: CRESTOR Take 1 tablet  (20 mg total) by mouth daily. What changed: when to take this   zolpidem 10 MG tablet Commonly known as: AMBIEN Take 10 mg by mouth at bedtime.       Allergies:  Allergies  Allergen Reactions  . Codeine Swelling    Tolerates hydrocodone, tramadol, and oxycodone     Past Medical History, Surgical history, Social history, and Family History were reviewed and updated.  Review of Systems: All other 10 point review of systems is negative.   Physical Exam:  vitals were not taken for this visit.   Wt Readings from Last 3 Encounters:  01/10/19 199 lb 3.2 oz (90.4 kg)  12/24/18 219 lb 12.8 oz (99.7 kg)  12/21/18 198 lb (89.8 kg)     Lab Results  Component Value Date   WBC 40.1 (H) 01/11/2019   HGB 11.2 (L) 01/11/2019   HCT 35.0 (L) 01/11/2019   MCV 103.6 (H) 01/11/2019   PLT 392 01/11/2019   Lab Results  Component Value Date   FERRITIN 121 11/03/2018   IRON 31 (L) 11/03/2018   TIBC 284 11/03/2018   UIBC 253 11/03/2018   IRONPCTSAT 11 (L) 11/03/2018   Lab Results  Component Value Date   RETICCTPCT 5.4 (H) 08/13/2018   RBC 3.38 (L) 01/11/2019   No results found for: KPAFRELGTCHN, LAMBDASER, KAPLAMBRATIO No results found for: IGGSERUM, IGA, IGMSERUM No results found for: Kathrynn Ducking, MSPIKE, SPEI   Chemistry      Component Value Date/Time   NA 135 01/11/2019 1004   K 3.9 01/11/2019 1004   CL 97 (L) 01/11/2019 1004   CO2 28 01/11/2019 1004   BUN 7 (L) 01/11/2019 1004   CREATININE 0.71 01/11/2019 1004      Component Value Date/Time   CALCIUM 9.2 01/11/2019 1004   ALKPHOS 105 01/11/2019 1004   AST 21 01/11/2019 1004   ALT 11 01/11/2019 1004   BILITOT 1.0 01/11/2019 1004       Impression and Plan: Justin [name] is a very pleasant 63 yo caucasian gentleman with essential thrombocythemia, JAK2 V617 mutation+; high risk (age >64).  His counts remain stable and he will continue his same regimen with Hydrea for now.    We will see him again in another month.  He will contact our office with any questions or concerns. We can certainly see him sooner if needed.   Laverna Peace, NP 12/23/202010:36 AM

## 2019-01-12 ENCOUNTER — Encounter: Payer: Self-pay | Admitting: Cardiology

## 2019-01-12 ENCOUNTER — Telehealth: Payer: Self-pay | Admitting: Hematology & Oncology

## 2019-01-12 NOTE — Telephone Encounter (Signed)
Appointments scheduled letter/calendar mailed per 12/23 los

## 2019-01-18 ENCOUNTER — Other Ambulatory Visit: Payer: Self-pay

## 2019-01-18 ENCOUNTER — Encounter: Payer: Self-pay | Admitting: Gastroenterology

## 2019-01-18 ENCOUNTER — Ambulatory Visit: Payer: BC Managed Care – PPO | Admitting: Gastroenterology

## 2019-01-18 VITALS — BP 116/70 | HR 97 | Temp 97.9°F | Ht 68.0 in | Wt 198.2 lb

## 2019-01-18 DIAGNOSIS — D45 Polycythemia vera: Secondary | ICD-10-CM

## 2019-01-18 DIAGNOSIS — Z87891 Personal history of nicotine dependence: Secondary | ICD-10-CM

## 2019-01-18 DIAGNOSIS — Z951 Presence of aortocoronary bypass graft: Secondary | ICD-10-CM

## 2019-01-18 DIAGNOSIS — K279 Peptic ulcer, site unspecified, unspecified as acute or chronic, without hemorrhage or perforation: Secondary | ICD-10-CM | POA: Diagnosis not present

## 2019-01-18 DIAGNOSIS — K219 Gastro-esophageal reflux disease without esophagitis: Secondary | ICD-10-CM | POA: Diagnosis not present

## 2019-01-18 DIAGNOSIS — Z9889 Other specified postprocedural states: Secondary | ICD-10-CM

## 2019-01-18 NOTE — Progress Notes (Signed)
P  Chief Complaint:    GERD, peptic ulcer disease  GI History: 63 y.o.malewith history of polycythemia vera, thrombocytosis, chronic anemia (hemoglobin baseline 11), CAD s/pfour-vessel CABG 12/2017, moderate to severe ASs/paortic valve replacement and left atrial clip 12/2017 initially referred to the Gastroenterology Clinic in 06/2018 for evaluation of coffee-ground secretions. States that he has intermittent "dark film" that "leaks from the mouth" and staining the pillowcase/beard. This has occurred intermittently since his surgery in 12/2017. No forceful emesis. No nausea. No melena or hematochezia.  Admitted 07/2018 with lightheadedness, dizziness, intermittent melena, and coffee-ground secretions.  Hemoglobin 4.2 on admission (from 11.2 in 06/2018).  Hemoglobin decreased to 3.4 on 7/22.  Iron 16, sat 6%, TIBC 279, ferritin 61.  Treated with Protonix gtt., 5 unit PRBCs with discharge hemoglobin 9.9 on 7/25.  EGD with multiple cratered and linear gastric ulcers, with the largest being 10 mm.  No endoscopic intervention needed, and treated with high-dose PPI.  Plan to restart Eliquis 3 days after discharge, hold all NSAIDs, continue high-dose PPI,   Additionally, he has a longstanding history of intermittent reflux symptoms, that he has treated with OTC antacids prn.Has had increasing reflux symptoms, described as mainly nocturnal symptoms of regurgitation, belching. +Hoarseness. Denies dysphagia or odynophagia.  Endoscopic history: - Colonoscopy approximately 2018 by Dr. Penelope Coop at [facility].  Patient reports benign polyps.  No report in EMR. -EGD (07/2018, Dr. Silverio Decamp): Small HH, many cratered and linear gastric ulcers, largest 10 mm.  Treated with high-dose PPI  HPI:     Patient is a 63 y.o. male presenting to the Gastroenterology Clinic for follow-up.  Last seen by me in 08/2018 following hospital admission in 07/2018 with UGI bleed requiring 5 unit PRBC transfusion.  Was feeling  well at that time with resolution of melena with plan for conservative observation, serial labs, due to recent CABG.  Right hip replacement earlier this month.  Seen by Hematology clinic on 12/23-continuing Hydrea.  Follow-up with Cardiology on 12/22.  Today, he states he is doing well from a reflux standpoint.  Reflux symptoms well controlled with Protonix 40 mg/day.  Tolerating all p.o. intake without issue.  No GI complaints today.  Ambulating with walker assistance after recent hip replacement.  Hgb/Hct 11.2/35 last week (stable from previous). Elevated WBC at 40, but in the setting of recent right hip replacement.  Follows in the Hematology clinic.   Review of systems:     No chest pain, no SOB, no fevers, no urinary sx   Past Medical History:  Diagnosis Date  . Acute meniscal tear of knee LEFT  . Anemia   . Aortic stenosis 12/01/2017   NONRHEUMATIC, AORTIC VALVE CALCIFICATIONS, MILD TO MODERATE REGURG, MILD TO MODERATE CALCIFIED ANNULUS per ECHO 10/25/17 @ [facility]  . Arthritis   . Atrial fibrillation ([place]) 11/12/2017   AT O/V WITH PCP  . Cancer ([place])    skin right arm  . Coronary artery disease   . DM (diabetes mellitus) ([place])   . Heart murmur MILD-- ASYMPTOMATIC  . History of kidney stones   . Hyperlipidemia   . Hypertension   . Left knee pain   . PAD (peripheral artery disease) ([facility])    left leg claudication    Patient's surgical history, family medical history, social history, medications and allergies were all reviewed in Epic    Current Outpatient Medications  Medication Sig Dispense Refill  . apixaban (ELIQUIS) 5 MG TABS tablet Take 1 tablet (5 mg  total) by mouth 2 (two) times daily. 60 tablet   . aspirin 81 MG tablet Take 81 mg by mouth daily.     . Carboxymethylcellulose Sodium (REFRESH TEARS OP) Place 1-2 drops into both eyes daily as needed (dry eyes).    . Cholecalciferol 125 MCG (5000 UT) capsule Take 5,000 Units by mouth daily.     .  cyclobenzaprine (FLEXERIL) 10 MG tablet Take 10 mg by mouth 3 (three) times daily.     . ferrous sulfate 325 (65 FE) MG tablet Take 1 tablet (325 mg total) by mouth 2 (two) times daily with a meal. 60 tablet 0  . fluticasone (FLONASE) 50 MCG/ACT nasal spray Place 2 sprays into both nostrils daily.    . furosemide (LASIX) 20 MG tablet Take 20 mg by mouth daily as needed for fluid.     Marland Kitchen HYDROcodone-acetaminophen (NORCO) 7.5-325 MG tablet Take 1-2 tablets by mouth every 4 (four) hours as needed for severe pain (pain score 7-10). 56 tablet 0  . hydroxyurea (HYDREA) 500 MG capsule TAKE 1 CAPSULE BY MOUTH 2 TIMES DAILY. MAY TAKE WITH FOOD TO MINIMIZE GI SIDE EFFECTS. (Patient taking differently: daily. Take 1 tablet one day and then 2 tablet the other day. Alternate) 180 capsule 2  . Lidocaine (BLUE-EMU PAIN RELIEF DRY EX) Apply 1 application topically daily as needed (pain).    Marland Kitchen loratadine (CLARITIN) 10 MG tablet Take 10 mg by mouth daily.    . metFORMIN (GLUCOPHAGE-XR) 500 MG 24 hr tablet Take 500 mg by mouth daily.     . methocarbamol (ROBAXIN) 500 MG tablet Take 1 tablet (500 mg total) by mouth every 6 (six) hours as needed for muscle spasms. 40 tablet 0  . metoprolol succinate (TOPROL XL) 25 MG 24 hr tablet Take 0.5 tablets (12.5 mg total) by mouth daily. (Patient taking differently: Take 25 mg by mouth daily. ) 30 tablet 0  . Multiple Vitamins-Minerals (CENTRUM ADULTS) TABS Take 1 capsule by mouth daily.    . nefazodone (SERZONE) 100 MG tablet Take 100 mg by mouth 2 (two) times daily.    . pantoprazole (PROTONIX) 40 MG tablet Take 1 tablet (40 mg total) by mouth daily. 90 tablet 3  . potassium chloride (MICRO-K) 10 MEQ CR capsule Take 10 mEq by mouth daily as needed (take when taking lasix).    . pregabalin (LYRICA) 50 MG capsule Take 50 mg by mouth 3 (three) times daily.    . rosuvastatin (CRESTOR) 20 MG tablet Take 1 tablet (20 mg total) by mouth daily. (Patient taking differently: Take 20 mg by  mouth every evening. ) 90 tablet 3  . amoxicillin (AMOXIL) 500 MG capsule Take 1 capsule (500 mg total) by mouth once as needed for up to 4 doses. Take four 500 mg capsules by mouth 1 hour before dental visits (Patient not taking: Reported on 01/18/2019) 4 capsule 12  . zolpidem (AMBIEN) 10 MG tablet Take 10 mg by mouth at bedtime.      No current facility-administered medications for this visit.    Physical Exam:     BP 116/70   Pulse 97   Temp 97.9 F (36.6 C)   Ht 5\' 8"  (1.727 m)   Wt 198 lb 4 oz (89.9 kg)   BMI 30.14 kg/m   GENERAL:  Pleasant male in NAD.  Ambulating steadily with walker assist PSYCH: : Cooperative, normal affect EENT:  conjunctiva pink, mucous membranes moist, neck supple without masses CARDIAC:  Murmur c/w heart  valve replacement, irreg irreg.  PULM: Normal respiratory effort, lungs CTA bilaterally, no wheezing ABDOMEN:  Nondistended, soft, nontender. No obvious masses, no hepatomegaly,  normal bowel sounds SKIN:  turgor, no lesions seen Musculoskeletal:  Normal muscle tone, normal strength NEURO: Alert and oriented x 3, no focal neurologic deficits   IMPRESSION and PLAN:    1) GERD 2) Peptic ulcer disease -Reflux symptoms well controlled on current therapy.  No change at this time -Resume Protonix 40 mg/day -Resume antireflux lifestyle/dietary measures -No evidence of overt GI blood loss and hemoglobin stable  4) CADs/pfour-vessel CABG 12/2017 5) History of carotid endarterectomy -Antiplatelet/anticoagulation as prescribed -Recommend long-term PPI for continued gastric prophylaxis as above  6) Polycythemia vera/Thrombocytosis: -Follows with Hematology.  Taking Hydrea  7)History of tobacco use: -Applauded his continued abstinence from smoking  RTC in 6-12 months or sooner as needed     I spent over 30 minutes of time, including in depth chart review, independent review of results as outlined above, communicating results with the patient  directly, face-to-face time with the patient, coordinating care, and ordering studies and medications as appropriate. Greater than 50% of the time was spent counseling and coordinating care.        [place] ,DO, FACG 01/18/2019, 10:18 AM

## 2019-01-18 NOTE — Patient Instructions (Signed)
Follow up in 6-12 months  It was a pleasure to see you today!  Vito Cirigliano, D.O.

## 2019-01-27 ENCOUNTER — Other Ambulatory Visit: Payer: Self-pay | Admitting: Cardiology

## 2019-01-27 DIAGNOSIS — Z951 Presence of aortocoronary bypass graft: Secondary | ICD-10-CM

## 2019-01-27 DIAGNOSIS — I4819 Other persistent atrial fibrillation: Secondary | ICD-10-CM

## 2019-02-03 DIAGNOSIS — Z96641 Presence of right artificial hip joint: Secondary | ICD-10-CM | POA: Diagnosis not present

## 2019-02-03 DIAGNOSIS — Z471 Aftercare following joint replacement surgery: Secondary | ICD-10-CM | POA: Diagnosis not present

## 2019-02-10 ENCOUNTER — Other Ambulatory Visit: Payer: Self-pay

## 2019-02-10 ENCOUNTER — Telehealth: Payer: Self-pay | Admitting: *Deleted

## 2019-02-10 ENCOUNTER — Other Ambulatory Visit: Payer: BC Managed Care – PPO

## 2019-02-10 ENCOUNTER — Ambulatory Visit: Payer: BC Managed Care – PPO | Admitting: Hematology

## 2019-02-10 ENCOUNTER — Encounter: Payer: Self-pay | Admitting: Hematology

## 2019-02-10 ENCOUNTER — Inpatient Hospital Stay: Payer: Medicare Other

## 2019-02-10 ENCOUNTER — Inpatient Hospital Stay: Payer: Medicare Other | Attending: Hematology and Oncology | Admitting: Hematology

## 2019-02-10 VITALS — BP 117/67 | HR 95 | Temp 97.5°F | Resp 18 | Ht 68.0 in | Wt 186.0 lb

## 2019-02-10 DIAGNOSIS — D473 Essential (hemorrhagic) thrombocythemia: Secondary | ICD-10-CM

## 2019-02-10 DIAGNOSIS — Z7982 Long term (current) use of aspirin: Secondary | ICD-10-CM | POA: Diagnosis not present

## 2019-02-10 DIAGNOSIS — Z7901 Long term (current) use of anticoagulants: Secondary | ICD-10-CM | POA: Insufficient documentation

## 2019-02-10 DIAGNOSIS — Z79899 Other long term (current) drug therapy: Secondary | ICD-10-CM | POA: Insufficient documentation

## 2019-02-10 DIAGNOSIS — I2510 Atherosclerotic heart disease of native coronary artery without angina pectoris: Secondary | ICD-10-CM | POA: Insufficient documentation

## 2019-02-10 DIAGNOSIS — D72829 Elevated white blood cell count, unspecified: Secondary | ICD-10-CM | POA: Diagnosis not present

## 2019-02-10 DIAGNOSIS — I700 Atherosclerosis of aorta: Secondary | ICD-10-CM | POA: Insufficient documentation

## 2019-02-10 DIAGNOSIS — Z96641 Presence of right artificial hip joint: Secondary | ICD-10-CM | POA: Insufficient documentation

## 2019-02-10 LAB — CMP (CANCER CENTER ONLY)
ALT: 9 U/L (ref 0–44)
AST: 19 U/L (ref 15–41)
Albumin: 3.8 g/dL (ref 3.5–5.0)
Alkaline Phosphatase: 128 U/L — ABNORMAL HIGH (ref 38–126)
Anion gap: 7 (ref 5–15)
BUN: 6 mg/dL — ABNORMAL LOW (ref 8–23)
CO2: 27 mmol/L (ref 22–32)
Calcium: 9.6 mg/dL (ref 8.9–10.3)
Chloride: 99 mmol/L (ref 98–111)
Creatinine: 0.73 mg/dL (ref 0.61–1.24)
GFR, Est AFR Am: >60 mL/min (ref >60)
GFR, Estimated: >60 mL/min (ref >60)
Glucose, Bld: 100 mg/dL — ABNORMAL HIGH (ref 70–99)
Potassium: 4.2 mmol/L (ref 3.5–5.1)
Sodium: 133 mmol/L — ABNORMAL LOW (ref 135–145)
Total Bilirubin: 0.6 mg/dL (ref 0.3–1.2)
Total Protein: 7.6 g/dL (ref 6.5–8.1)

## 2019-02-10 LAB — CBC WITH DIFFERENTIAL (CANCER CENTER ONLY)
Abs Immature Granulocytes: 4.31 10*3/uL — ABNORMAL HIGH (ref 0.00–0.07)
Basophils Absolute: 0.9 10*3/uL — ABNORMAL HIGH (ref 0.0–0.1)
Basophils Relative: 2 %
Eosinophils Absolute: 0.3 10*3/uL (ref 0.0–0.5)
Eosinophils Relative: 1 %
HCT: 40.3 % (ref 39.0–52.0)
Hemoglobin: 13.1 g/dL (ref 13.0–17.0)
Immature Granulocytes: 8 %
Lymphocytes Relative: 4 %
Lymphs Abs: 2.3 10*3/uL (ref 0.7–4.0)
MCH: 32 pg (ref 26.0–34.0)
MCHC: 32.5 g/dL (ref 30.0–36.0)
MCV: 98.3 fL (ref 80.0–100.0)
Monocytes Absolute: 2.6 10*3/uL — ABNORMAL HIGH (ref 0.1–1.0)
Monocytes Relative: 5 %
Neutro Abs: 44.5 10*3/uL — ABNORMAL HIGH (ref 1.7–7.7)
Neutrophils Relative %: 80 %
Platelet Count: 411 10*3/uL — ABNORMAL HIGH (ref 150–400)
RBC: 4.1 MIL/uL — ABNORMAL LOW (ref 4.22–5.81)
RDW: 18 % — ABNORMAL HIGH (ref 11.5–15.5)
WBC Count: 54.8 10*3/uL (ref 4.0–10.5)
nRBC: 0.2 % (ref 0.0–0.2)

## 2019-02-10 NOTE — Telephone Encounter (Signed)
Dr. Maylon Peppers notified of WBC-54.8.  No new orders received at this time.

## 2019-02-10 NOTE — Progress Notes (Signed)
[facility] OFFICE PROGRESS NOTE  Patient Care Team: [name], MD as PCP - General (Family Medicine) Buford Dresser, MD as PCP - Cardiology (Cardiology)  HEME/ONC OVERVIEW: 1. Essential thrombocythemia, JAK2 V617 mutation+; high risk (age >41) -Previous patient of Dr. 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 CAD s/p CABG x 4 in 12/2017 2. Moderate to severe aortic stenosis s/p aortic valve replacement and left atrial clip in 12/2017   ASSESSMENT & PLAN:   Essential thrombocythemia, JAK2+; high risk  -Previous patient of Dr. RuAudelia HivesPlt count 411k today, slightly higher than the last visit  -Currently on Hydrea 5007mID, alternating with 500m3mily -Due to rising leukocytosis, I have increased his Hydrea to 500mg57m  -I will repeat labs in 2 weeks, and see him back in 4 weeks -If the leukocytosis continues to rise, then we will increase Hydrea to 500mg 55mand [facility]  -On ASA 81mg d75m   Leukocytosis -Most likely secondary to ET  -Bone marrow bx results as above; CT CAP in 11/2018 negative for any lymphadenopathy or  infection  -WBC 55k today, higher than the last visit  -Clinically, patient denies any symptoms of infection -Hydrea dose and monitoring as above   Orders Placed This Encounter  Procedures  . CBC with Differential (Cancer Center Only)    Standing Status:   Standing    Number of Occurrences:   20    Standing Expiration Date:   02/10/2020  . CMP (Cancer [place]   Standing Status:   Standing    Number of Occurrences:   20    Standing Expiration Date:   02/10/2020  . Lactate dehydrogenase    Standing Status:   Standing    Number of Occurrences:   20    Standing Expiration Date:   02/10/2020   All questions were answered. The patient knows to call the clinic with any problems, questions or concerns. No barriers to learning was detected.  Return in 2 weeks for labs only, and in 4 weeks for labs and clinic follow-up.   Justin [name] ZhaTish [id number]/20212:[id number] PM  CHIEF COMPLAINT: "I am doing fine"  INTERVAL HISTORY: Mr. Wittwer Bialass to clinic for follow-up of high-risk ET on Hydrea and aspirin.  He reports that he has been recovering well since the recent right hip replacement, and is able to walk without significant restriction.  He has been compliant with Hydrea 500 mg BID alternating with 500 mg daily.  He denies any constitutional symptoms or symptoms of infection.  He denies any other complaint today.  REVIEW OF SYSTEMS:   Constitutional: ( - ) fevers, ( - )  chills , ( - ) night sweats Eyes: ( - ) blurriness of vision, ( - )  double vision, ( - ) watery eyes Ears, nose, mouth, throat, and face: ( - ) mucositis, ( - ) sore throat Respiratory: ( - ) cough, ( - ) dyspnea, ( - ) wheezes Cardiovascular: ( - ) palpitation, ( - ) chest discomfort, ( - ) lower extremity swelling Gastrointestinal:  ( - ) nausea, ( - ) heartburn, ( - ) change in bowel habits Skin: ( - ) abnormal skin rashes Lymphatics: ( - ) new lymphadenopathy, ( - ) easy bruising Neurological: ( - ) numbness, ( - ) tingling, (  - ) new weaknesses Behavioral/Psych: ( - ) mood change, ( - ) new changes  All other systems were reviewed with the patient and are negative.  SUMMARY OF ONCOLOGIC HISTORY: Oncology History   No history exists.    I have reviewed the past medical history, past surgical history, social history and family history with the patient and they are unchanged from previous note.  ALLERGIES:  is allergic to codeine.  MEDICATIONS:  Current Outpatient Medications  Medication Sig Dispense Refill  . apixaban (ELIQUIS) 5 MG TABS tablet Take 1 tablet (5 mg total) by mouth 2 (two) times daily. 60 tablet   . aspirin 81 MG tablet Take 81 mg by mouth daily.     . Carboxymethylcellulose Sodium (REFRESH TEARS OP) Place 1-2 drops into both eyes daily as needed (dry eyes).    . Cholecalciferol 125 MCG (5000 UT) capsule Take 5,000 Units by mouth daily.     . cyclobenzaprine (FLEXERIL) 10 MG tablet Take 10 mg by mouth 3 (three) times daily.     . ferrous sulfate 325 (65 FE) MG tablet Take 1 tablet (325 mg total) by mouth 2 (two) times daily with a meal. 60 tablet 0  . fluticasone (FLONASE) 50 MCG/ACT nasal spray Place 2 sprays into both nostrils daily.    . furosemide (LASIX) 20 MG tablet Take 20 mg by mouth daily as needed for fluid.     . hydroxyurea (HYDREA) 500 MG capsule TAKE 1 CAPSULE BY MOUTH 2 TIMES DAILY. MAY TAKE WITH FOOD TO MINIMIZE GI SIDE EFFECTS. (Patient taking differently: daily. Take 1 tablet one day and then 2 tablet the other day. Alternate) 180 capsule 2  . Lidocaine (BLUE-EMU PAIN RELIEF DRY EX) Apply 1 application topically daily as needed (pain).    Marland Kitchen loratadine (CLARITIN) 10 MG tablet Take 10 mg by mouth daily.    . metFORMIN (GLUCOPHAGE-XR) 500 MG 24 hr tablet Take 500 mg by mouth daily.     . methocarbamol (ROBAXIN) 500 MG tablet Take 1 tablet (500 mg total) by mouth every 6 (six) hours as needed for muscle spasms. 40 tablet 0  . metoprolol succinate (TOPROL-XL) 25 MG 24 hr tablet TAKE  1 TABLET BY MOUTH EVERY DAY 90 tablet 3  . Multiple Vitamins-Minerals (CENTRUM ADULTS) TABS Take 1 capsule by mouth daily.    . nefazodone (SERZONE) 100 MG tablet Take 100 mg by mouth 2 (two) times daily.    . pantoprazole (PROTONIX) 40 MG tablet Take 1 tablet (40 mg total) by mouth daily. 90 tablet 3  . potassium chloride (MICRO-K) 10 MEQ CR capsule Take 10 mEq by mouth daily as needed (take when taking lasix).    . pregabalin (LYRICA) 50 MG capsule Take 50 mg by mouth 3 (three) times daily.    . rosuvastatin (CRESTOR) 20 MG tablet Take 1 tablet (20 mg total) by mouth every evening. 90 tablet 3  . zolpidem (AMBIEN)  10 MG tablet Take 10 mg by mouth at bedtime.     Marland Kitchen amoxicillin (AMOXIL) 500 MG capsule Take 1 capsule (500 mg total) by mouth once as needed for up to 4 doses. Take four 500 mg capsules by mouth 1 hour before dental visits (Patient not taking: Reported on 01/18/2019) 4 capsule 12   No current facility-administered medications for this visit.    PHYSICAL EXAMINATION: ECOG PERFORMANCE STATUS: 1 - Symptomatic but completely ambulatory  Today's Vitals   02/10/19 1336  BP: 117/67  Pulse: 95  Resp: 18  Temp: (!) 97.5 F (36.4 C)  TempSrc: Temporal  SpO2: 100%  Weight: 186 lb (84.4 kg)  Height: 5' 8"  (1.727 m)  PainSc: 0-No pain   Body mass index is 28.28 kg/m.  Filed Weights   02/10/19 1336  Weight: 186 lb (84.4 kg)    GENERAL: alert, no distress and comfortable SKIN: skin color, texture, turgor are normal, no rashes or significant lesions EYES: conjunctiva are pink and non-injected, sclera clear OROPHARYNX: no exudate, no erythema; lips, buccal mucosa, and tongue normal  NECK: supple, non-tender LYMPH:  no palpable lymphadenopathy in the cervical LUNGS: clear to auscultation with normal breathing effort HEART: regular rate & rhythm and no murmurs and no lower extremity edema ABDOMEN: soft, non-tender, non-distended, normal bowel sounds Musculoskeletal: no cyanosis  of digits and no clubbing  PSYCH: alert & oriented x 3, fluent speech  LABORATORY DATA:  I have reviewed the data as listed    Component Value Date/Time   NA 133 (L) 02/10/2019 1314   K 4.2 02/10/2019 1314   CL 99 02/10/2019 1314   CO2 27 02/10/2019 1314   GLUCOSE 100 (H) 02/10/2019 1314   BUN 6 (L) 02/10/2019 1314   CREATININE 0.73 02/10/2019 1314   CALCIUM 9.6 02/10/2019 1314   PROT 7.6 02/10/2019 1314   ALBUMIN 3.8 02/10/2019 1314   AST 19 02/10/2019 1314   ALT 9 02/10/2019 1314   ALKPHOS 128 (H) 02/10/2019 1314   BILITOT 0.6 02/10/2019 1314   GFRNONAA >60 02/10/2019 1314   GFRAA >60 02/10/2019 1314    No results found for: SPEP, UPEP  Lab Results  Component Value Date   WBC 54.8 (HH) 02/10/2019   NEUTROABS 44.5 (H) 02/10/2019   HGB 13.1 02/10/2019   HCT 40.3 02/10/2019   MCV 98.3 02/10/2019   PLT 411 (H) 02/10/2019      Chemistry      Component Value Date/Time   NA 133 (L) 02/10/2019 1314   K 4.2 02/10/2019 1314   CL 99 02/10/2019 1314   CO2 27 02/10/2019 1314   BUN 6 (L) 02/10/2019 1314   CREATININE 0.73 02/10/2019 1314      Component Value Date/Time   CALCIUM 9.6 02/10/2019 1314   ALKPHOS 128 (H) 02/10/2019 1314   AST 19 02/10/2019 1314   ALT 9 02/10/2019 1314   BILITOT 0.6 02/10/2019 1314       RADIOGRAPHIC STUDIES: I have personally reviewed the radiological images as listed below and agreed with the findings in the report. No results found.

## 2019-02-13 LAB — LACTATE DEHYDROGENASE: LDH: 520 U/L — ABNORMAL HIGH (ref 98–192)

## 2019-02-24 ENCOUNTER — Other Ambulatory Visit: Payer: Medicare Other

## 2019-02-27 ENCOUNTER — Telehealth: Payer: Self-pay | Admitting: *Deleted

## 2019-02-27 ENCOUNTER — Inpatient Hospital Stay: Payer: Medicare Other | Attending: Hematology and Oncology

## 2019-02-27 ENCOUNTER — Other Ambulatory Visit: Payer: Self-pay

## 2019-02-27 DIAGNOSIS — I350 Nonrheumatic aortic (valve) stenosis: Secondary | ICD-10-CM | POA: Diagnosis not present

## 2019-02-27 DIAGNOSIS — Z79899 Other long term (current) drug therapy: Secondary | ICD-10-CM | POA: Diagnosis not present

## 2019-02-27 DIAGNOSIS — Z7901 Long term (current) use of anticoagulants: Secondary | ICD-10-CM | POA: Diagnosis not present

## 2019-02-27 DIAGNOSIS — D473 Essential (hemorrhagic) thrombocythemia: Secondary | ICD-10-CM | POA: Insufficient documentation

## 2019-02-27 DIAGNOSIS — D72829 Elevated white blood cell count, unspecified: Secondary | ICD-10-CM | POA: Diagnosis not present

## 2019-02-27 DIAGNOSIS — Z7984 Long term (current) use of oral hypoglycemic drugs: Secondary | ICD-10-CM | POA: Diagnosis not present

## 2019-02-27 DIAGNOSIS — I2510 Atherosclerotic heart disease of native coronary artery without angina pectoris: Secondary | ICD-10-CM | POA: Diagnosis not present

## 2019-02-27 DIAGNOSIS — Z7982 Long term (current) use of aspirin: Secondary | ICD-10-CM | POA: Diagnosis not present

## 2019-02-27 LAB — CMP (CANCER CENTER ONLY)
ALT: 9 U/L (ref 0–44)
AST: 18 U/L (ref 15–41)
Albumin: 3.8 g/dL (ref 3.5–5.0)
Alkaline Phosphatase: 119 U/L (ref 38–126)
Anion gap: 9 (ref 5–15)
BUN: 7 mg/dL — ABNORMAL LOW (ref 8–23)
CO2: 25 mmol/L (ref 22–32)
Calcium: 9.5 mg/dL (ref 8.9–10.3)
Chloride: 102 mmol/L (ref 98–111)
Creatinine: 0.76 mg/dL (ref 0.61–1.24)
GFR, Est AFR Am: >60 mL/min (ref >60)
GFR, Estimated: >60 mL/min (ref >60)
Glucose, Bld: 132 mg/dL — ABNORMAL HIGH (ref 70–99)
Potassium: 4.1 mmol/L (ref 3.5–5.1)
Sodium: 136 mmol/L (ref 135–145)
Total Bilirubin: 0.7 mg/dL (ref 0.3–1.2)
Total Protein: 7.3 g/dL (ref 6.5–8.1)

## 2019-02-27 LAB — CBC WITH DIFFERENTIAL (CANCER CENTER ONLY)
Abs Immature Granulocytes: 1.98 10*3/uL — ABNORMAL HIGH (ref 0.00–0.07)
Basophils Absolute: 0.6 10*3/uL — ABNORMAL HIGH (ref 0.0–0.1)
Basophils Relative: 2 %
Eosinophils Absolute: 0.2 10*3/uL (ref 0.0–0.5)
Eosinophils Relative: 1 %
HCT: 41.8 % (ref 39.0–52.0)
Hemoglobin: 13 g/dL (ref 13.0–17.0)
Immature Granulocytes: 6 %
Lymphocytes Relative: 4 %
Lymphs Abs: 1.6 10*3/uL (ref 0.7–4.0)
MCH: 30.3 pg (ref 26.0–34.0)
MCHC: 31.1 g/dL (ref 30.0–36.0)
MCV: 97.4 fL (ref 80.0–100.0)
Monocytes Absolute: 1.3 10*3/uL — ABNORMAL HIGH (ref 0.1–1.0)
Monocytes Relative: 4 %
Neutro Abs: 29.8 10*3/uL — ABNORMAL HIGH (ref 1.7–7.7)
Neutrophils Relative %: 83 %
Platelet Count: 388 10*3/uL (ref 150–400)
RBC: 4.29 MIL/uL (ref 4.22–5.81)
RDW: 18.2 % — ABNORMAL HIGH (ref 11.5–15.5)
WBC Count: 35.4 10*3/uL — ABNORMAL HIGH (ref 4.0–10.5)
nRBC: 0.2 % (ref 0.0–0.2)

## 2019-02-27 LAB — LACTATE DEHYDROGENASE: LDH: 366 U/L — ABNORMAL HIGH (ref 98–192)

## 2019-02-27 NOTE — Telephone Encounter (Signed)
As noted below by Dr. Maylon Peppers, I informed the patient that his WBC is improving, and blood counts are stable. He should continue the Hydrea at the current dose. He verbalized understanding.

## 2019-02-27 NOTE — Telephone Encounter (Signed)
-----   Message from Tish Men, MD sent at 02/27/2019 11:03 AM EST ----- Delrae Sawyers,  Can you let Mr. Babel know that his WBC is improving, and his blood counts are otherwise stable, so he should continue the Hydrea at the current dose.   Thanks.  [place]  ----- Message ----- From: Buel Ream, [facility] [place] Sent: 02/27/2019  10:48 AM EST To: Tish Men, MD

## 2019-03-08 DIAGNOSIS — I739 Peripheral vascular disease, unspecified: Secondary | ICD-10-CM | POA: Diagnosis not present

## 2019-03-09 ENCOUNTER — Telehealth: Payer: Self-pay | Admitting: Hematology

## 2019-03-10 ENCOUNTER — Inpatient Hospital Stay: Payer: Medicare Other

## 2019-03-10 ENCOUNTER — Inpatient Hospital Stay: Payer: Medicare Other | Admitting: Hematology

## 2019-03-12 ENCOUNTER — Ambulatory Visit: Payer: Medicare Other | Attending: Internal Medicine

## 2019-03-12 DIAGNOSIS — Z23 Encounter for immunization: Secondary | ICD-10-CM | POA: Insufficient documentation

## 2019-03-12 NOTE — Progress Notes (Signed)
   Covid-19 Vaccination Clinic  Name:  Justin [name]    MRN: [id number] DOB: [birth year]  03/12/2019  Justin [name] was observed post Covid-19 immunization for 30 minutes based on pre-vaccination screening without incidence. He was provided with Vaccine Information Sheet and instruction to access the V-Safe system.   Justin [name] was instructed to call 911 with any severe reactions post vaccine: Marland Kitchen Difficulty breathing  . Swelling of your face and throat  . A fast heartbeat  . A bad rash all over your body  . Dizziness and weakness    Immunizations Administered    Name Date Dose VIS Date Route   Pfizer COVID-19 Vaccine 03/12/2019 12:43 PM 0.3 mL 12/30/2018 Intramuscular   Manufacturer: [place]   Lot: [id number]   [place]: [id number]

## 2019-03-13 ENCOUNTER — Encounter: Payer: Self-pay | Admitting: Hematology

## 2019-03-13 ENCOUNTER — Inpatient Hospital Stay (HOSPITAL_BASED_OUTPATIENT_CLINIC_OR_DEPARTMENT_OTHER): Payer: Medicare Other | Admitting: Hematology

## 2019-03-13 ENCOUNTER — Other Ambulatory Visit: Payer: Self-pay

## 2019-03-13 ENCOUNTER — Inpatient Hospital Stay: Payer: Medicare Other

## 2019-03-13 VITALS — BP 124/80 | HR 89 | Temp 97.4°F | Resp 18 | Ht 68.0 in | Wt 194.4 lb

## 2019-03-13 DIAGNOSIS — Z7984 Long term (current) use of oral hypoglycemic drugs: Secondary | ICD-10-CM | POA: Diagnosis not present

## 2019-03-13 DIAGNOSIS — I2510 Atherosclerotic heart disease of native coronary artery without angina pectoris: Secondary | ICD-10-CM | POA: Diagnosis not present

## 2019-03-13 DIAGNOSIS — I350 Nonrheumatic aortic (valve) stenosis: Secondary | ICD-10-CM | POA: Diagnosis not present

## 2019-03-13 DIAGNOSIS — D72825 Bandemia: Secondary | ICD-10-CM | POA: Diagnosis not present

## 2019-03-13 DIAGNOSIS — D473 Essential (hemorrhagic) thrombocythemia: Secondary | ICD-10-CM

## 2019-03-13 DIAGNOSIS — Z7901 Long term (current) use of anticoagulants: Secondary | ICD-10-CM | POA: Diagnosis not present

## 2019-03-13 DIAGNOSIS — Z79899 Other long term (current) drug therapy: Secondary | ICD-10-CM | POA: Diagnosis not present

## 2019-03-13 DIAGNOSIS — D72829 Elevated white blood cell count, unspecified: Secondary | ICD-10-CM | POA: Diagnosis not present

## 2019-03-13 DIAGNOSIS — Z7982 Long term (current) use of aspirin: Secondary | ICD-10-CM | POA: Diagnosis not present

## 2019-03-13 LAB — CMP (CANCER CENTER ONLY)
ALT: 9 U/L (ref 0–44)
AST: 17 U/L (ref 15–41)
Albumin: 4 g/dL (ref 3.5–5.0)
Alkaline Phosphatase: 109 U/L (ref 38–126)
Anion gap: 9 (ref 5–15)
BUN: 8 mg/dL (ref 8–23)
CO2: 29 mmol/L (ref 22–32)
Calcium: 10 mg/dL (ref 8.9–10.3)
Chloride: 101 mmol/L (ref 98–111)
Creatinine: 0.7 mg/dL (ref 0.61–1.24)
GFR, Est AFR Am: >60 mL/min (ref >60)
GFR, Estimated: >60 mL/min (ref >60)
Glucose, Bld: 135 mg/dL — ABNORMAL HIGH (ref 70–99)
Potassium: 4.3 mmol/L (ref 3.5–5.1)
Sodium: 139 mmol/L (ref 135–145)
Total Bilirubin: 0.6 mg/dL (ref 0.3–1.2)
Total Protein: 7.8 g/dL (ref 6.5–8.1)

## 2019-03-13 LAB — CBC WITH DIFFERENTIAL (CANCER CENTER ONLY)
Abs Immature Granulocytes: 1.72 10*3/uL — ABNORMAL HIGH (ref 0.00–0.07)
Basophils Absolute: 0.6 10*3/uL — ABNORMAL HIGH (ref 0.0–0.1)
Basophils Relative: 2 %
Eosinophils Absolute: 0.1 10*3/uL (ref 0.0–0.5)
Eosinophils Relative: 0 %
HCT: 43.7 % (ref 39.0–52.0)
Hemoglobin: 13.8 g/dL (ref 13.0–17.0)
Immature Granulocytes: 5 %
Lymphocytes Relative: 5 %
Lymphs Abs: 1.6 10*3/uL (ref 0.7–4.0)
MCH: 31.2 pg (ref 26.0–34.0)
MCHC: 31.6 g/dL (ref 30.0–36.0)
MCV: 98.9 fL (ref 80.0–100.0)
Monocytes Absolute: 1.2 10*3/uL — ABNORMAL HIGH (ref 0.1–1.0)
Monocytes Relative: 4 %
Neutro Abs: 26.7 10*3/uL — ABNORMAL HIGH (ref 1.7–7.7)
Neutrophils Relative %: 84 %
Platelet Count: 315 10*3/uL (ref 150–400)
RBC: 4.42 MIL/uL (ref 4.22–5.81)
RDW: 19.4 % — ABNORMAL HIGH (ref 11.5–15.5)
WBC Count: 32 10*3/uL — ABNORMAL HIGH (ref 4.0–10.5)
nRBC: 0.2 % (ref 0.0–0.2)

## 2019-03-13 LAB — LACTATE DEHYDROGENASE: LDH: 350 U/L — ABNORMAL HIGH (ref 98–192)

## 2019-03-13 NOTE — Progress Notes (Signed)
[facility] OFFICE PROGRESS NOTE  Patient Care Team: [name], MD as PCP - General (Family Medicine) Buford Dresser, MD as PCP - Cardiology (Cardiology)  HEME/ONC OVERVIEW: 1. Essential thrombocythemia, JAK2 V617 mutation+; high risk (age >53) -Previous patient of Dr. 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 CAD s/p CABG x 4 in 12/2017 2. Moderate to severe aortic stenosis s/p aortic valve replacement and left atrial clip in 12/2017   ASSESSMENT & PLAN:   Essential thrombocythemia, JAK2+; high risk  -Previous patient of Dr. RuAudelia HivesPlt count 315k today, improving  -Currently on Hydrea 50077mID -Overall, his thrombocytosis and leukocytosis are both improving on the current regimen -Therefore, we will continue the current dose for now  -We will monitor his CBC monthly for toxicities and disease response  -On ASA 54m80mily   Leukocytosis -Most likely secondary to ET  -Bone marrow bx results as above; CT CAP in 11/2018 negative for any lymphadenopathy or infection  -WBC 32k 97Wh neutrophilic predominance, improving  -Clinically, patient denies any symptoms of  infection -Hydrea dose and monitoring as above   No orders of the defined types were placed in this encounter.  All questions were answered. The patient knows to call the clinic with any problems, questions or concerns. No barriers to learning was detected.  Return in 1 month for labs, and 2 months for labs and clinic appt.   Justin [name] 2/22/202111:31 AM  CHIEF COMPLAINT: "I am doing fine"  INTERVAL HISTORY: Justin [name] clinic for follow-up of high risk essential thrombocythemia on Hydrea.  He reports that he has been tolerating Hydrea 500 mg BID well since last visit, and he denies any side effects, such as nausea, vomiting, diarrhea, or rash.  He denies any constitutional symptoms or symptoms of infection.  He is still recovering from the recent hip surgery.  He denies any other complaint today.  REVIEW OF SYSTEMS:   Constitutional: ( - ) fevers, ( - )  chills , ( - ) night sweats Eyes: ( - ) blurriness of vision, ( - ) double vision, ( - ) watery eyes Ears, nose, mouth, throat, and face: ( - ) mucositis, ( - ) sore throat Respiratory: ( - ) cough, ( - ) dyspnea, ( - ) wheezes Cardiovascular: ( - ) palpitation, ( - ) chest discomfort, ( - ) lower extremity swelling Gastrointestinal:  ( - ) nausea, ( - ) heartburn, ( - ) change in bowel habits Skin: ( - ) abnormal skin rashes Lymphatics: ( - ) new lymphadenopathy, ( - ) easy bruising Neurological: ( - ) numbness, ( - ) tingling, ( - ) new weaknesses Behavioral/Psych: ( - ) mood change, ( - ) new changes  All other systems were reviewed with the patient and are  negative.  SUMMARY OF ONCOLOGIC HISTORY: Oncology History   No history exists.    I have reviewed the past medical history, past surgical history, social history and family history with the patient and they are unchanged from previous note.  ALLERGIES:  is allergic to codeine.  MEDICATIONS:  Current Outpatient Medications  Medication Sig Dispense Refill  .  apixaban (ELIQUIS) 5 MG TABS tablet Take 1 tablet (5 mg total) by mouth 2 (two) times daily. 60 tablet   . aspirin 81 MG tablet Take 81 mg by mouth daily.     . Carboxymethylcellulose Sodium (REFRESH TEARS OP) Place 1-2 drops into both eyes daily as needed (dry eyes).    . Cholecalciferol 125 MCG (5000 UT) capsule Take 5,000 Units by mouth daily.     . cyclobenzaprine (FLEXERIL) 10 MG tablet Take 10 mg by mouth 3 (three) times daily.     . ferrous sulfate 325 (65 FE) MG tablet Take 1 tablet (325 mg total) by mouth 2 (two) times daily with a meal. 60 tablet 0  . fluticasone (FLONASE) 50 MCG/ACT nasal spray Place 2 sprays into both nostrils daily.    . furosemide (LASIX) 20 MG tablet Take 20 mg by mouth daily as needed for fluid.     . hydroxyurea (HYDREA) 500 MG capsule TAKE 1 CAPSULE BY MOUTH 2 TIMES DAILY. MAY TAKE WITH FOOD TO MINIMIZE GI SIDE EFFECTS. (Patient taking differently: daily. Take 1 tablet one day and then 2 tablet the other day. Alternate) 180 capsule 2  . Lidocaine (BLUE-EMU PAIN RELIEF DRY EX) Apply 1 application topically daily as needed (pain).    Marland Kitchen loratadine (CLARITIN) 10 MG tablet Take 10 mg by mouth daily.    . metFORMIN (GLUCOPHAGE-XR) 500 MG 24 hr tablet Take 500 mg by mouth daily.     . methocarbamol (ROBAXIN) 500 MG tablet Take 1 tablet (500 mg total) by mouth every 6 (six) hours as needed for muscle spasms. 40 tablet 0  . metoprolol succinate (TOPROL-XL) 25 MG 24 hr tablet TAKE 1 TABLET BY MOUTH EVERY DAY 90 tablet 3  . Multiple Vitamins-Minerals (CENTRUM ADULTS) TABS Take 1 capsule by mouth daily.    . nefazodone (SERZONE) 100 MG tablet Take 100 mg by mouth 2 (two) times daily.    . pantoprazole (PROTONIX) 40 MG tablet Take 1 tablet (40 mg total) by mouth daily. 90 tablet 3  . potassium chloride (MICRO-K) 10 MEQ CR capsule Take 10 mEq by mouth daily as needed (take when taking lasix).    . pregabalin (LYRICA) 50 MG capsule Take 50 mg by mouth 3 (three) times daily.     . rosuvastatin (CRESTOR) 20 MG tablet Take 1 tablet (20 mg total) by mouth every evening. 90 tablet 3  . zolpidem (AMBIEN) 10 MG tablet Take 10 mg by mouth at bedtime.     Marland Kitchen amoxicillin (AMOXIL) 500 MG capsule Take 1 capsule (500 mg total) by mouth once as needed for up to 4 doses. Take four 500 mg capsules by mouth 1 hour before dental visits (Patient not taking: Reported on 01/18/2019) 4 capsule 12   No current facility-administered medications for this visit.    PHYSICAL EXAMINATION: ECOG PERFORMANCE STATUS: 1 - Symptomatic but completely ambulatory  Today's Vitals   03/13/19 1116  BP: 124/80  Pulse: 89  Resp: 18  Temp: (!) 97.4 F (36.3 C)  TempSrc: Temporal  SpO2: 99%  Weight: 194 lb 6.4 oz (88.2 kg)  Height: 5' 8"  (1.727  m)  PainSc: 0-No pain   Body mass index is 29.56 kg/m.  Filed Weights   03/13/19 1116  Weight: 194 lb 6.4 oz (88.2 kg)    GENERAL: alert, no distress and comfortable SKIN: skin color, texture, turgor are normal, no rashes or significant lesions EYES: conjunctiva are pink and non-injected, sclera clear OROPHARYNX: no exudate, no erythema; lips, buccal mucosa, and tongue normal  NECK: supple, non-tender LUNGS: clear to auscultation with normal breathing effort HEART: regular rate & rhythm and no murmurs and no lower extremity edema ABDOMEN: soft, non-tender, non-distended, normal bowel sounds Musculoskeletal: no cyanosis of digits and no clubbing  PSYCH: alert & oriented x 3, fluent speech  LABORATORY DATA:  I have reviewed the data as listed    Component Value Date/Time   NA 136 02/27/2019 1037   K 4.1 02/27/2019 1037   CL 102 02/27/2019 1037   CO2 25 02/27/2019 1037   GLUCOSE 132 (H) 02/27/2019 1037   BUN 7 (L) 02/27/2019 1037   CREATININE 0.76 02/27/2019 1037   CALCIUM 9.5 02/27/2019 1037   PROT 7.3 02/27/2019 1037   ALBUMIN 3.8 02/27/2019 1037   AST 18 02/27/2019 1037   ALT 9 02/27/2019 1037   ALKPHOS 119 02/27/2019 1037   BILITOT  0.7 02/27/2019 1037   GFRNONAA >60 02/27/2019 1037   GFRAA >60 02/27/2019 1037    No results found for: SPEP, UPEP  Lab Results  Component Value Date   WBC 32.0 (H) 03/13/2019   NEUTROABS 26.7 (H) 03/13/2019   HGB 13.8 03/13/2019   HCT 43.7 03/13/2019   MCV 98.9 03/13/2019   PLT 315 03/13/2019      Chemistry      Component Value Date/Time   NA 136 02/27/2019 1037   K 4.1 02/27/2019 1037   CL 102 02/27/2019 1037   CO2 25 02/27/2019 1037   BUN 7 (L) 02/27/2019 1037   CREATININE 0.76 02/27/2019 1037      Component Value Date/Time   CALCIUM 9.5 02/27/2019 1037   ALKPHOS 119 02/27/2019 1037   AST 18 02/27/2019 1037   ALT 9 02/27/2019 1037   BILITOT 0.7 02/27/2019 1037       RADIOGRAPHIC STUDIES: I have personally reviewed the radiological images as listed below and agreed with the findings in the report. No results found.

## 2019-03-24 DIAGNOSIS — E782 Mixed hyperlipidemia: Secondary | ICD-10-CM | POA: Diagnosis not present

## 2019-03-24 DIAGNOSIS — G43909 Migraine, unspecified, not intractable, without status migrainosus: Secondary | ICD-10-CM | POA: Diagnosis not present

## 2019-03-24 DIAGNOSIS — E119 Type 2 diabetes mellitus without complications: Secondary | ICD-10-CM | POA: Diagnosis not present

## 2019-03-24 DIAGNOSIS — I10 Essential (primary) hypertension: Secondary | ICD-10-CM | POA: Diagnosis not present

## 2019-04-05 ENCOUNTER — Ambulatory Visit: Payer: Medicare Other | Attending: Internal Medicine

## 2019-04-05 ENCOUNTER — Ambulatory Visit: Payer: Medicare Other

## 2019-04-05 DIAGNOSIS — Z23 Encounter for immunization: Secondary | ICD-10-CM

## 2019-04-05 NOTE — Progress Notes (Signed)
   Covid-19 Vaccination Clinic  Name:  Masanori Cavell    MRN: [id number] DOB: Jul 27, 1955  04/05/2019  Mr. Mages was observed post Covid-19 immunization for 30 minutes based on pre-vaccination screening without incident. He was provided with Vaccine Information Sheet and instruction to access the V-Safe system.   Mr. Nelan was instructed to call 911 with any severe reactions post vaccine: Marland Kitchen Difficulty breathing  . Swelling of face and throat  . A fast heartbeat  . A bad rash all over body  . Dizziness and weakness   Immunizations Administered    Name Date Dose VIS Date Route   Pfizer COVID-19 Vaccine 04/05/2019  3:09 PM 0.3 mL 12/30/2018 Intramuscular   Manufacturer: [place]   Lot: [id number]   [place]: [id number]      Covid-19 Vaccination Clinic  Name:  Griffith Dilmore    MRN: [id number] DOB: 07/16/1955  04/05/2019  Mr. Weld was observed post Covid-19 immunization for 30 minutes based on pre-vaccination screening without incident. He was provided with Vaccine Information Sheet and instruction to access the V-Safe system.   Mr. Gribbins was instructed to call 911 with any severe reactions post vaccine: Marland Kitchen Difficulty breathing  . Swelling of face and throat  . A fast heartbeat  . A bad rash all over body  . Dizziness and weakness   Immunizations Administered    Name Date Dose VIS Date Route   Pfizer COVID-19 Vaccine 04/05/2019  3:09 PM 0.3 mL 12/30/2018 Intramuscular   Manufacturer: [place]   Lot: [id number]   [place]: [id number]

## 2019-04-10 ENCOUNTER — Inpatient Hospital Stay: Payer: Medicare Other | Attending: Hematology and Oncology

## 2019-04-10 ENCOUNTER — Other Ambulatory Visit: Payer: Self-pay

## 2019-04-10 DIAGNOSIS — D473 Essential (hemorrhagic) thrombocythemia: Secondary | ICD-10-CM | POA: Diagnosis not present

## 2019-04-10 LAB — CBC WITH DIFFERENTIAL (CANCER CENTER ONLY)
Abs Immature Granulocytes: 2.18 10*3/uL — ABNORMAL HIGH (ref 0.00–0.07)
Basophils Absolute: 0.5 10*3/uL — ABNORMAL HIGH (ref 0.0–0.1)
Basophils Relative: 1 %
Eosinophils Absolute: 0.1 10*3/uL (ref 0.0–0.5)
Eosinophils Relative: 0 %
HCT: 45 % (ref 39.0–52.0)
Hemoglobin: 14.3 g/dL (ref 13.0–17.0)
Immature Granulocytes: 6 %
Lymphocytes Relative: 4 %
Lymphs Abs: 1.5 10*3/uL (ref 0.7–4.0)
MCH: 30.3 pg (ref 26.0–34.0)
MCHC: 31.8 g/dL (ref 30.0–36.0)
MCV: 95.3 fL (ref 80.0–100.0)
Monocytes Absolute: 1.9 10*3/uL — ABNORMAL HIGH (ref 0.1–1.0)
Monocytes Relative: 6 %
Neutro Abs: 28.8 10*3/uL — ABNORMAL HIGH (ref 1.7–7.7)
Neutrophils Relative %: 83 %
Platelet Count: 344 10*3/uL (ref 150–400)
RBC: 4.72 MIL/uL (ref 4.22–5.81)
RDW: 19.1 % — ABNORMAL HIGH (ref 11.5–15.5)
WBC Count: 35 10*3/uL — ABNORMAL HIGH (ref 4.0–10.5)
nRBC: 0.2 % (ref 0.0–0.2)

## 2019-04-10 LAB — CMP (CANCER CENTER ONLY)
ALT: 8 U/L (ref 0–44)
AST: 19 U/L (ref 15–41)
Albumin: 3.9 g/dL (ref 3.5–5.0)
Alkaline Phosphatase: 117 U/L (ref 38–126)
Anion gap: 7 (ref 5–15)
BUN: 8 mg/dL (ref 8–23)
CO2: 29 mmol/L (ref 22–32)
Calcium: 9.8 mg/dL (ref 8.9–10.3)
Chloride: 99 mmol/L (ref 98–111)
Creatinine: 0.72 mg/dL (ref 0.61–1.24)
GFR, Est AFR Am: >60 mL/min (ref >60)
GFR, Estimated: >60 mL/min (ref >60)
Glucose, Bld: 103 mg/dL — ABNORMAL HIGH (ref 70–99)
Potassium: 4.4 mmol/L (ref 3.5–5.1)
Sodium: 135 mmol/L (ref 135–145)
Total Bilirubin: 0.6 mg/dL (ref 0.3–1.2)
Total Protein: 7.7 g/dL (ref 6.5–8.1)

## 2019-04-10 LAB — LACTATE DEHYDROGENASE: LDH: 433 U/L — ABNORMAL HIGH (ref 98–192)

## 2019-05-11 ENCOUNTER — Telehealth: Payer: Self-pay | Admitting: Hematology

## 2019-05-11 ENCOUNTER — Other Ambulatory Visit: Payer: Self-pay

## 2019-05-11 ENCOUNTER — Inpatient Hospital Stay: Payer: Medicare Other | Attending: Hematology and Oncology | Admitting: Hematology

## 2019-05-11 ENCOUNTER — Inpatient Hospital Stay: Payer: Medicare Other

## 2019-05-11 ENCOUNTER — Encounter: Payer: Self-pay | Admitting: Hematology

## 2019-05-11 VITALS — BP 117/90 | HR 89 | Temp 97.7°F | Resp 18 | Ht 68.0 in | Wt 194.0 lb

## 2019-05-11 DIAGNOSIS — L568 Other specified acute skin changes due to ultraviolet radiation: Secondary | ICD-10-CM | POA: Diagnosis not present

## 2019-05-11 DIAGNOSIS — Z7901 Long term (current) use of anticoagulants: Secondary | ICD-10-CM | POA: Diagnosis not present

## 2019-05-11 DIAGNOSIS — Z79899 Other long term (current) drug therapy: Secondary | ICD-10-CM | POA: Diagnosis not present

## 2019-05-11 DIAGNOSIS — D72829 Elevated white blood cell count, unspecified: Secondary | ICD-10-CM

## 2019-05-11 DIAGNOSIS — Z7982 Long term (current) use of aspirin: Secondary | ICD-10-CM | POA: Diagnosis not present

## 2019-05-11 DIAGNOSIS — Z7984 Long term (current) use of oral hypoglycemic drugs: Secondary | ICD-10-CM | POA: Insufficient documentation

## 2019-05-11 DIAGNOSIS — Z85828 Personal history of other malignant neoplasm of skin: Secondary | ICD-10-CM | POA: Diagnosis not present

## 2019-05-11 DIAGNOSIS — D473 Essential (hemorrhagic) thrombocythemia: Secondary | ICD-10-CM

## 2019-05-11 LAB — CMP (CANCER CENTER ONLY)
ALT: 10 U/L (ref 0–44)
AST: 21 U/L (ref 15–41)
Albumin: 4.1 g/dL (ref 3.5–5.0)
Alkaline Phosphatase: 123 U/L (ref 38–126)
Anion gap: 4 — ABNORMAL LOW (ref 5–15)
BUN: 10 mg/dL (ref 8–23)
CO2: 32 mmol/L (ref 22–32)
Calcium: 10.3 mg/dL (ref 8.9–10.3)
Chloride: 98 mmol/L (ref 98–111)
Creatinine: 0.82 mg/dL (ref 0.61–1.24)
GFR, Est AFR Am: >60 mL/min (ref >60)
GFR, Estimated: >60 mL/min (ref >60)
Glucose, Bld: 101 mg/dL — ABNORMAL HIGH (ref 70–99)
Potassium: 4.5 mmol/L (ref 3.5–5.1)
Sodium: 134 mmol/L — ABNORMAL LOW (ref 135–145)
Total Bilirubin: 0.6 mg/dL (ref 0.3–1.2)
Total Protein: 7.8 g/dL (ref 6.5–8.1)

## 2019-05-11 LAB — LACTATE DEHYDROGENASE: LDH: 400 U/L — ABNORMAL HIGH (ref 98–192)

## 2019-05-11 LAB — CBC WITH DIFFERENTIAL (CANCER CENTER ONLY)
Abs Immature Granulocytes: 2.14 10*3/uL — ABNORMAL HIGH (ref 0.00–0.07)
Basophils Absolute: 0.6 10*3/uL — ABNORMAL HIGH (ref 0.0–0.1)
Basophils Relative: 2 %
Eosinophils Absolute: 0.1 10*3/uL (ref 0.0–0.5)
Eosinophils Relative: 0 %
HCT: 47.9 % (ref 39.0–52.0)
Hemoglobin: 15.3 g/dL (ref 13.0–17.0)
Immature Granulocytes: 6 %
Lymphocytes Relative: 4 %
Lymphs Abs: 1.5 10*3/uL (ref 0.7–4.0)
MCH: 30.4 pg (ref 26.0–34.0)
MCHC: 31.9 g/dL (ref 30.0–36.0)
MCV: 95 fL (ref 80.0–100.0)
Monocytes Absolute: 1.6 10*3/uL — ABNORMAL HIGH (ref 0.1–1.0)
Monocytes Relative: 4 %
Neutro Abs: 32 10*3/uL — ABNORMAL HIGH (ref 1.7–7.7)
Neutrophils Relative %: 84 %
Platelet Count: 256 10*3/uL (ref 150–400)
RBC: 5.04 MIL/uL (ref 4.22–5.81)
RDW: 20.3 % — ABNORMAL HIGH (ref 11.5–15.5)
WBC Count: 37.9 10*3/uL — ABNORMAL HIGH (ref 4.0–10.5)
nRBC: 0.2 % (ref 0.0–0.2)

## 2019-05-11 NOTE — Telephone Encounter (Signed)
Appointments scheduled  As requested patient has My Chart per 4/22 los

## 2019-05-11 NOTE — Progress Notes (Signed)
[facility] OFFICE PROGRESS NOTE  Patient Care Team: [name], MD as PCP - General (Family Medicine) Buford Dresser, MD as PCP - Cardiology (Cardiology)  HEME/ONC OVERVIEW: 1. Essential thrombocythemia, JAK2 V617 mutation+; high risk (age >36) -Previous patient of Justin [name]  -11/2017: Hgb ~17; WBC ~30k w/ nl diff, plts ~500k  NGS showed JAK2 V617F mutation (83% allele frequency), CALR mutation (4% allele frequency); BCR/ABL FISH negative  No bone marrow biopsy done at diagnosis  -11/2017 - present: Hydrea, see dosing below -09/2018: progressive leukocytosis; BM bx results below  Hypercellular marrow with myeloid hyperplasia and increased atypical megakaryocytes, clusters of megakaryocytes, increased reticulin fibers (MF 1 of 3); no increase in blast population   Karyotype 46,XY,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 CAD s/p CABG x 4 in 12/2017 2. Moderate to severe aortic stenosis s/p aortic valve replacement and left atrial clip in 12/2017   ASSESSMENT & PLAN:   Essential thrombocythemia, JAK2+; high risk  -Previous patient of Justin [name]-Plt count 256k today, improving  -Currently on Hydrea 5016mBID -Given the normalization of the platelet count, we will continue the current dose for now  -We will monitor his CBC q2m16monthor toxicities and disease response  -On ASA 81m30mily   Leukocytosis -Most likely secondary to ET  -Bone marrow bx results as above; CT CAP in 11/2018 negative for any lymphadenopathy or infection  -WBC 37.954.5Gh neutrophilic predominance, stable -Clinically, patient denies any symptoms of infection -I personally reviewed the patient's peripheral blood smear, which showed increased  number of WBCs with mature neutrophils.  There was no apparent dysplastic change.  There was no circulating blasts. -Hydrea dose and monitoring as above   Photosensitivity of the skin -Patient has hx of actinic keratosis and squamous cell carcinoma of the skin, for which he is followed by dermatology -I counseled the patient on increased photosensitivity of the skin associated with Hydrea and the importance of sunscreen as well as avoiding prolonged sun exposure -Continue follow-up with dermatology  Orders Placed This Encounter  Procedures  . CBC with Differential (Cancer Center Only)    Standing Status:   Future    Standing Expiration Date:   06/14/2020  . CMP ([facility])    Standing Status:   Future    Standing Expiration Date:   06/14/2020  . Save Smear ([facility])    Standing Status:   Future    Standing Expiration Date:   05/10/2020  . Lactate dehydrogenase    Standing Status:   Future    Standing Expiration Date:   06/14/2020   The total time spent in the encounter was 30 minutes, including face-to-face time with the patient, review of various tests results, order additional studies/medications, documentation, and coordination of care plan.   All questions were answered. The patient knows to call the clinic with any problems, questions or concerns. No barriers to learning was detected.  Return in 2 months for labs and clinic follow-up.  Justin [name] 4/22/202110:38 AM  CHIEF COMPLAINT: "I am doing fine"  INTERVAL HISTORY: [name] clinic for follow-up of high risk essential thrombocythemia on Hydrea.  Patient reports that he has been tolerating Hydrea well, except somewhat altered taste with certain types of food, such as ham and sausage.  He has a history of actinic keratosis with squamous of carcinoma of the skin, for which he is followed by dermatology, but since starting Hydrea, he has noticed increased photosensitivity of the skin.  He denies any other side  effects from Hydrea.  He denies any constitutional symptoms.  REVIEW OF SYSTEMS:   Constitutional: ( - ) fevers, ( - )  chills , ( - ) night sweats Eyes: ( - ) blurriness of vision, ( - ) double vision, ( - ) watery eyes Ears, nose, mouth, throat, and face: ( - ) mucositis, ( - ) sore throat Respiratory: ( - ) cough, ( - ) dyspnea, ( - ) wheezes Cardiovascular: ( - ) palpitation, ( - ) chest discomfort, ( - ) lower extremity swelling Gastrointestinal:  ( - ) nausea, ( - ) heartburn, ( - ) change in bowel habits Skin: ( - ) abnormal skin rashes Lymphatics: ( - ) new lymphadenopathy, ( - ) easy bruising Neurological: ( - ) numbness, ( - ) tingling, ( - ) new weaknesses Behavioral/Psych: ( - ) mood change, ( - ) new changes  All other systems were reviewed with the patient and are negative.  I have reviewed the past medical history, past surgical history, social history and family history with the patient and they are unchanged from previous note.  ALLERGIES:  is allergic to codeine.  MEDICATIONS:  Current Outpatient Medications  Medication Sig Dispense Refill  . apixaban (ELIQUIS) 5 MG TABS tablet Take 1 tablet (5 mg total) by mouth 2 (two) times daily. 60 tablet   . aspirin 81 MG tablet Take 81 mg by mouth daily.     . Carboxymethylcellulose Sodium (REFRESH TEARS OP) Place 1-2 drops into both eyes daily as needed (dry eyes).    . Cholecalciferol 125 MCG (5000 UT) capsule Take 5,000 Units by mouth daily.     . cyclobenzaprine (FLEXERIL) 10 MG tablet Take 10 mg by mouth 3 (three) times daily.     . ferrous sulfate 325 (65 FE) MG tablet Take 1 tablet (325 mg total) by mouth 2 (two) times daily with a meal. 60 tablet 0  . fluticasone (FLONASE) 50 MCG/ACT nasal spray Place 2 sprays into both nostrils daily.    . furosemide (LASIX) 20 MG tablet Take 20 mg by mouth daily as needed for fluid.     Marland Kitchen HYDROcodone-acetaminophen (NORCO) 7.5-325 MG tablet TAKE 1 2 TABLETS BY MOUTH AS NEEDED EVERY 4  HOURS    . hydroxyurea (HYDREA) 500 MG capsule TAKE 1 CAPSULE BY MOUTH 2 TIMES DAILY. MAY TAKE WITH FOOD TO MINIMIZE GI SIDE EFFECTS. (Patient taking differently: daily. Take 1 tablet one day and then 2 tablet the other day. Alternate) 180 capsule 2  . Lidocaine (BLUE-EMU PAIN RELIEF DRY EX) Apply 1 application topically daily as needed (pain).    Marland Kitchen loratadine (CLARITIN) 10 MG tablet Take 10 mg by mouth daily.    . metFORMIN (GLUCOPHAGE-XR) 500 MG 24 hr tablet Take 500 mg by mouth daily.     . methocarbamol (ROBAXIN) 500 MG tablet Take 1 tablet (500 mg total) by mouth every 6 (six) hours as needed for muscle spasms. 40 tablet 0  . metoprolol succinate (TOPROL-XL) 25 MG 24 hr tablet TAKE 1 TABLET BY MOUTH EVERY DAY 90 tablet 3  . Multiple Vitamins-Minerals (CENTRUM ADULTS) TABS Take 1 capsule by mouth daily.    . pantoprazole (PROTONIX) 40 MG tablet Take 1 tablet (40 mg total) by mouth daily.  90 tablet 3  . potassium chloride (MICRO-K) 10 MEQ CR capsule Take 10 mEq by mouth daily as needed (take when taking lasix).    . pregabalin (LYRICA) 50 MG capsule Take 50 mg by mouth 3 (three) times daily.    . rosuvastatin (CRESTOR) 20 MG tablet Take 1 tablet (20 mg total) by mouth every evening. 90 tablet 3  . zolpidem (AMBIEN) 10 MG tablet Take 10 mg by mouth at bedtime.     Marland Kitchen amoxicillin (AMOXIL) 500 MG capsule Take 1 capsule (500 mg total) by mouth once as needed for up to 4 doses. Take four 500 mg capsules by mouth 1 hour before dental visits (Patient not taking: Reported on 01/18/2019) 4 capsule 12   No current facility-administered medications for this visit.    PHYSICAL EXAMINATION: ECOG PERFORMANCE STATUS: 0 - Asymptomatic  Today's Vitals   05/11/19 1019  BP: 117/90  Pulse: 89  Resp: 18  Temp: 97.7 F (36.5 C)  TempSrc: Temporal  SpO2: 99%  Weight: 194 lb (88 kg)  Height: 5' 8"  (1.727 m)  PainSc: 0-No pain   Body mass index is 29.5 kg/m.  Filed Weights   05/11/19 1019  Weight: 194  lb (88 kg)    GENERAL: alert, no distress and comfortable SKIN: extensive chronic skin damage from sun exposure EYES: conjunctiva are pink and non-injected, sclera clear OROPHARYNX: no exudate, no erythema; lips, buccal mucosa, and tongue normal  NECK: supple, non-tender LUNGS: clear to auscultation with normal breathing effort HEART: regular rate & rhythm and no murmurs and no lower extremity edema ABDOMEN: soft, non-tender, non-distended, normal bowel sounds Musculoskeletal: no cyanosis of digits and no clubbing  PSYCH: alert & oriented x 3, fluent speech  LABORATORY DATA:  I have reviewed the data as listed    Component Value Date/Time   NA 134 (L) 05/11/2019 0956   K 4.5 05/11/2019 0956   CL 98 05/11/2019 0956   CO2 32 05/11/2019 0956   GLUCOSE 101 (H) 05/11/2019 0956   BUN 10 05/11/2019 0956   CREATININE 0.82 05/11/2019 0956   CALCIUM 10.3 05/11/2019 0956   PROT 7.8 05/11/2019 0956   ALBUMIN 4.1 05/11/2019 0956   AST 21 05/11/2019 0956   ALT 10 05/11/2019 0956   ALKPHOS 123 05/11/2019 0956   BILITOT 0.6 05/11/2019 0956   GFRNONAA >60 05/11/2019 0956   GFRAA >60 05/11/2019 0956    No results found for: SPEP, UPEP  Lab Results  Component Value Date   WBC 37.9 (H) 05/11/2019   NEUTROABS 32.0 (H) 05/11/2019   HGB 15.3 05/11/2019   HCT 47.9 05/11/2019   MCV 95.0 05/11/2019   PLT 256 05/11/2019      Chemistry      Component Value Date/Time   NA 134 (L) 05/11/2019 0956   K 4.5 05/11/2019 0956   CL 98 05/11/2019 0956   CO2 32 05/11/2019 0956   BUN 10 05/11/2019 0956   CREATININE 0.82 05/11/2019 0956      Component Value Date/Time   CALCIUM 10.3 05/11/2019 0956   ALKPHOS 123 05/11/2019 0956   AST 21 05/11/2019 0956   ALT 10 05/11/2019 0956   BILITOT 0.6 05/11/2019 0956       RADIOGRAPHIC STUDIES: I have personally reviewed the radiological images as listed below and agreed with the findings in the report. No results found.

## 2019-05-12 DIAGNOSIS — C44629 Squamous cell carcinoma of skin of left upper limb, including shoulder: Secondary | ICD-10-CM | POA: Diagnosis not present

## 2019-05-12 DIAGNOSIS — Z85828 Personal history of other malignant neoplasm of skin: Secondary | ICD-10-CM | POA: Diagnosis not present

## 2019-05-12 DIAGNOSIS — D225 Melanocytic nevi of trunk: Secondary | ICD-10-CM | POA: Diagnosis not present

## 2019-05-12 DIAGNOSIS — Z08 Encounter for follow-up examination after completed treatment for malignant neoplasm: Secondary | ICD-10-CM | POA: Diagnosis not present

## 2019-05-12 DIAGNOSIS — L570 Actinic keratosis: Secondary | ICD-10-CM | POA: Diagnosis not present

## 2019-05-12 DIAGNOSIS — L814 Other melanin hyperpigmentation: Secondary | ICD-10-CM | POA: Diagnosis not present

## 2019-05-31 ENCOUNTER — Telehealth: Payer: Self-pay | Admitting: Hematology

## 2019-06-09 DIAGNOSIS — L570 Actinic keratosis: Secondary | ICD-10-CM | POA: Diagnosis not present

## 2019-06-09 DIAGNOSIS — X32XXXD Exposure to sunlight, subsequent encounter: Secondary | ICD-10-CM | POA: Diagnosis not present

## 2019-06-09 DIAGNOSIS — Z08 Encounter for follow-up examination after completed treatment for malignant neoplasm: Secondary | ICD-10-CM | POA: Diagnosis not present

## 2019-06-09 DIAGNOSIS — Z85828 Personal history of other malignant neoplasm of skin: Secondary | ICD-10-CM | POA: Diagnosis not present

## 2019-06-21 ENCOUNTER — Other Ambulatory Visit: Payer: Self-pay

## 2019-06-21 ENCOUNTER — Encounter: Payer: Self-pay | Admitting: Cardiology

## 2019-06-21 ENCOUNTER — Ambulatory Visit: Payer: Medicare Other | Admitting: Cardiology

## 2019-06-21 VITALS — BP 134/80 | HR 82 | Ht 68.0 in | Wt 200.8 lb

## 2019-06-21 DIAGNOSIS — Z9889 Other specified postprocedural states: Secondary | ICD-10-CM

## 2019-06-21 DIAGNOSIS — I4821 Permanent atrial fibrillation: Secondary | ICD-10-CM

## 2019-06-21 DIAGNOSIS — I10 Essential (primary) hypertension: Secondary | ICD-10-CM

## 2019-06-21 DIAGNOSIS — Z952 Presence of prosthetic heart valve: Secondary | ICD-10-CM

## 2019-06-21 DIAGNOSIS — I2510 Atherosclerotic heart disease of native coronary artery without angina pectoris: Secondary | ICD-10-CM | POA: Diagnosis not present

## 2019-06-21 DIAGNOSIS — Z951 Presence of aortocoronary bypass graft: Secondary | ICD-10-CM

## 2019-06-21 DIAGNOSIS — D45 Polycythemia vera: Secondary | ICD-10-CM

## 2019-06-21 DIAGNOSIS — I739 Peripheral vascular disease, unspecified: Secondary | ICD-10-CM

## 2019-06-21 DIAGNOSIS — I2583 Coronary atherosclerosis due to lipid rich plaque: Secondary | ICD-10-CM

## 2019-06-21 NOTE — Progress Notes (Signed)
Cardiology Office Note:    Date:  06/21/2019   ID:  Justin [name], DOB 06/02/1955, MRN [id number]  PCP:  [name], MD  Cardiologist:  Buford Dresser, MD PhD  Referring MD: [name], MD   CC: follow up  History of Present Illness:    Justin [name] is a 64 y.o. male with a hx of obesity, diabetes, prior tobacco use, hypertension, peripheral vascular disease who is seen in follow up for atrial fibrillation and s/p R CEA and CABG with AVR 01/03/18 for bicuspid aortic valve stenosis. My initial visit with him was on 12/07/17.   Pertinent PMH -atrial fibrillation diagnosed 2019 -R CEA, CABG, AVR (bicuspid valve) 12/2017. -Admission 08/13/2018 for severe anemia 2/2 gastric ulcers -PAD: 30 mmHg difference between arms, known stenotic disease on dopplers from 12/2017. Noted to have severely dampened flow throughout. L brachial pressures also noted as severely reduced from R arm at the time of dopplers. Remains with claudication, though trying to walk through it. R leg with numbness from vein harvest.  -S/P THA 12/24/2018 -essential thrombocythemia/JAK mutation, followed by Dr. Maylon Peppers, on hydrea  Today: Pending second hip surgery in the near future, no date set but hopefully in the next month. Hip pain is main issues, not significant PAD/claudication symptoms.  ECG still afib, but he is asymptomatic. Denies chest pain, shortness of breath at rest or with normal exertion. No PND, orthopnea, LE edema or unexpected weight gain (has gradually gone up since surgery). No syncope or palpitations. Hasn't had to take lasix in months. Working on diet/exercise. Tolerating other medications well.  No melena, hematochezia, or hematuria. No significant bleeding.  Following with derm, has had several spots biopsied recently.  Can climb stairs if needed, achieving >4 METs, without symptoms.  Past Medical History:  Diagnosis Date  . Acute meniscal tear of knee LEFT  . Anemia   .  Aortic stenosis 12/01/2017   NONRHEUMATIC, AORTIC VALVE CALCIFICATIONS, MILD TO MODERATE REGURG, MILD TO MODERATE CALCIFIED ANNULUS per ECHO 10/25/17 @ [facility]  . Arthritis   . Atrial fibrillation ([place]) 11/12/2017   AT O/V WITH PCP  . Cancer ([place])    skin right arm  . Coronary artery disease   . DM (diabetes mellitus) ([place])   . Heart murmur MILD-- ASYMPTOMATIC  . History of kidney stones   . Hyperlipidemia   . Hypertension   . Left knee pain   . PAD (peripheral artery disease) ([facility])    left leg claudication    Past Surgical History:  Procedure Laterality Date  . AORTIC VALVE REPLACEMENT N/A 01/03/2018   Procedure: AORTIC VALVE REPLACEMENT (AVR) using 35mm Magna Ease Bioprosthesis Aortic Valve;  Surgeon: Ivin Poot, MD;  Location: [place];  Service: Open Heart Surgery;  Laterality: N/A;  . APPENDECTOMY  1998  . BIOPSY  08/11/2018   Procedure: BIOPSY;  Surgeon: Mauri Pole, MD;  Location: [place] [facility] [facility];  Service: Endoscopy;;  . CATARACT EXTRACTION W/ INTRAOCULAR LENS  IMPLANT, BILATERAL  1998/  2000  . CERVICAL FUSION  1985   C4 - 5  . CORONARY ARTERY BYPASS GRAFT N/A 01/03/2018   Procedure: CORONARY ARTERY BYPASS GRAFTING (CABG) x 4 (LIMA to LAD, SVG to DIAGONAL, SVG to RAMUS INTERMEDIATE, and SVG to PDA), USING LEFT INFTERNAL MAMMARY ARTERY AND;  Surgeon: Prescott Gum, Collier Salina, MD;  Location: [facility];  Service: Open Heart Surgery;  Laterality: N/A;  . ENDARTERECTOMY Right 01/03/2018   Procedure: ENDARTERECTOMY CAROTID;  Surgeon: Monica Martinez  J, MD;  Location[facility];  Service: Vascular;  Laterality: Right;  . ESOPHAGOGASTRODUODENOSCOPY (EGD) WITH PROPOFOL N/A 08/11/2018   Procedure: ESOPHAGOGASTRODUODENOSCOPY (EGD) WITH PROPOFOL;  Surgeon: Mauri Pole, MD;  Location: [place] [facility];  Service: Endoscopy;  Laterality: N/A;  . KNEE ARTHROSCOPY W/ MENISCECTOMY  1991  1987   LEFT KNEE   x 2  . NASAL SINUS SURGERY  1982  . RIGHT/LEFT HEART CATH AND CORONARY  ANGIOGRAPHY N/A 12/24/2017   Procedure: RIGHT/LEFT HEART CATH AND CORONARY ANGIOGRAPHY;  Surgeon: Jolaine Artist, MD;  Location: [facility];  Service: Cardiovascular;  Laterality: N/A;  . ROTATOR CUFF REPAIR  09-04-2005   LEFT SHOULDER  . TEE WITHOUT CARDIOVERSION N/A 12/24/2017   Procedure: TRANSESOPHAGEAL ECHOCARDIOGRAM (TEE);  Surgeon: Jolaine Artist, MD;  Location: [facility];  Service: Cardiovascular;  Laterality: N/A;  . TEE WITHOUT CARDIOVERSION N/A 01/03/2018   Procedure: TRANSESOPHAGEAL ECHOCARDIOGRAM (TEE);  Surgeon: Prescott Gum, Collier Salina, MD;  Location: [place];  Service: Open Heart Surgery;  Laterality: N/A;  . TONSILLECTOMY  AGE 58  . TOTAL HIP ARTHROPLASTY Right 12/23/2018   Procedure: TOTAL HIP ARTHROPLASTY ANTERIOR APPROACH;  Surgeon: Paralee Cancel, MD;  Location: [facility];  Service: Orthopedics;  Laterality: Right;  70 mins    Current Medications: Current Outpatient Medications on File Prior to Visit  Medication Sig  . amoxicillin (AMOXIL) 500 MG capsule Take 1 capsule (500 mg total) by mouth once as needed for up to 4 doses. Take four 500 mg capsules by mouth 1 hour before dental visits  . apixaban (ELIQUIS) 5 MG TABS tablet Take 1 tablet (5 mg total) by mouth 2 (two) times daily.  Marland Kitchen aspirin 81 MG tablet Take 81 mg by mouth daily.   . Carboxymethylcellulose Sodium (REFRESH TEARS OP) Place 1-2 drops into both eyes daily as needed (dry eyes).  . Cholecalciferol 125 MCG (5000 UT) capsule Take 5,000 Units by mouth daily.   . cyclobenzaprine (FLEXERIL) 10 MG tablet Take 10 mg by mouth 3 (three) times daily.   . ferrous sulfate 325 (65 FE) MG tablet Take 1 tablet (325 mg total) by mouth 2 (two) times daily with a meal.  . furosemide (LASIX) 20 MG tablet Take 20 mg by mouth daily as needed for fluid.   Marland Kitchen HYDROcodone-acetaminophen (NORCO) 7.5-325 MG tablet TAKE 1 2 TABLETS BY MOUTH AS NEEDED EVERY 4 HOURS  . hydroxyurea (HYDREA) 500 MG capsule TAKE 1 CAPSULE BY MOUTH 2 TIMES  DAILY. MAY TAKE WITH FOOD TO MINIMIZE GI SIDE EFFECTS. (Patient taking differently: daily. Take 1 tablet one day and then 2 tablet the other day. Alternate)  . Lidocaine (BLUE-EMU PAIN RELIEF DRY EX) Apply 1 application topically daily as needed (pain).  Marland Kitchen loratadine (CLARITIN) 10 MG tablet Take 10 mg by mouth daily.  . metFORMIN (GLUCOPHAGE-XR) 500 MG 24 hr tablet Take 500 mg by mouth daily.   . methocarbamol (ROBAXIN) 500 MG tablet Take 1 tablet (500 mg total) by mouth every 6 (six) hours as needed for muscle spasms.  . metoprolol succinate (TOPROL-XL) 25 MG 24 hr tablet TAKE 1 TABLET BY MOUTH EVERY DAY  . Multiple Vitamins-Minerals (CENTRUM ADULTS) TABS Take 1 capsule by mouth daily.  . pantoprazole (PROTONIX) 40 MG tablet Take 1 tablet (40 mg total) by mouth daily.  . potassium chloride (MICRO-K) 10 MEQ CR capsule Take 10 mEq by mouth daily as needed (take when taking lasix).  . pregabalin (LYRICA) 50 MG capsule Take 50 mg by mouth  3 (three) times daily.  . rosuvastatin (CRESTOR) 20 MG tablet Take 1 tablet (20 mg total) by mouth every evening.  . zolpidem (AMBIEN) 10 MG tablet Take 10 mg by mouth at bedtime.    No current facility-administered medications on file prior to visit.    Allergies:   Codeine   Social History   Tobacco Use  . Smoking status: Former Smoker    Packs/day: 0.25    Years: 23.00    Pack years: 5.75    Types: Cigars    Quit date: 01/02/2018    Years since quitting: 1.4  . Smokeless tobacco: Never Used  . Tobacco comment: quit cigs in 2011 and smokes cigars daily- from 3-10 cigars-09/27/13  Substance Use Topics  . Alcohol use: Not Currently    Alcohol/week: 0.0 standard drinks    Comment: occasional  . Drug use: No    Family History: Family history unknown as he was adopted.  ROS:   Please see the history of present illness.  Additional pertinent ROS otherwise unremarkable.  EKGs/Labs/Other Studies Reviewed:    The following studies were reviewed  today:  Echo 08/01/18  1. The left ventricle has normal systolic function with an ejection fraction of 60-65%. The cavity size was normal. There is mildly increased left ventricular wall thickness. Left ventricular diastolic Doppler parameters are indeterminate.  2. The right ventricle has normal systolic function.  3. Left atrial size was moderately dilated.  4. A 27mm an [employer] valve is present in the aortic position. Procedure Date: 01/03/18.  5. Aortic valve prosthesis appears to open well.  Echo 10/25/17 - Left ventricle: Wall thickness was increased in a pattern of mild   LVH. Systolic function was mildly to moderately reduced. The   estimated ejection fraction was in the range of 40% to 45%. - Aortic valve: Mildly to moderately calcified annulus. Mildly   thickened, moderately calcified leaflets. There was mild to   moderate regurgitation. Valve area (VTI): 0.93 cm^2. Valve area   (Vmax): 0.93 cm^2. Valve area (Vmean): 0.9 cm^2. - Left atrium: The atrium was mildly dilated. - Right atrium: The atrium was mildly dilated.  TEE 12/24/17 - Left ventricle: The estimated ejection fraction was 55%. - Aortic valve: Trileaflet; moderately calcified leaflets; fusion   of the left-noncoronary commissure. There was moderate stenosis. - Mitral valve: No evidence of vegetation. There was mild to   moderate regurgitation. - Left atrium: The atrium was dilated. No evidence of thrombus in   the atrial cavity or appendage. - Right atrium: The atrium was dilated. - Atrial septum: No defect or patent foramen ovale was identified. - Tricuspid valve: No evidence of vegetation. - Pulmonic valve: No evidence of vegetation.  [facility] 12/24/17  Ost RCA to Prox RCA lesion is 95% stenosed.  Prox LAD lesion is 95% stenosed.  Mid RCA lesion is 80% stenosed.  Dist RCA lesion is 99% stenosed.  Post Atrio lesion is 50% stenosed.  Prox Cx to Mid Cx lesion is 80% stenosed.  Mid LM to Dist LM  lesion is 50% stenosed.  Ost 1st Diag lesion is 90% stenosed.  There is moderate aortic valve stenosis.   Findings:  RA = 6 RV = 34/5 PA = 34/9 (20) PCW = 21 Fick cardiac output/index = 5.5/2.7 PVR = < 1.0 WU Ao sat = 97% PA sat = 73%, 74%  Assessment: 1. Moderate aortic stenosis 2. Severe 3v CAD 3. Normal LV function 4. Relatively normal hemodynamics Plan/Discussion: Films reviewed  with Dr. Prescott Gum. Plan AVR/CABG +/- Maze with Dr. Prescott Gum next week.   Surgery 01/03/18 Procedure (s):  1. Coronary artery bypass grafting x4 (left internal mammary artery to left anterior descending, saphenous vein graft to diagonal, saphenous vein graft to OM1, saphenous vein graft to posterior descending). 2. Aortic valve replacement with a 23 mm Edwards pericardial valve, serial [id number]. 3. Application of left atrial clip, AtriCure 35 mm. 4. Endoscopic harvest of right leg greater saphenous vein by Dr. Prescott Gum on 01/03/2018.  Also R CEA with bovine patch angioplasty  EKG:  EKG is personally reviewed.  The ekg ordered today demonstrates atrial fibrillation at 82 bpm  Recent Labs: 05/11/2019: ALT 10; BUN 10; Creatinine 0.82; Hemoglobin 15.3; Platelet Count 256; Potassium 4.5; Sodium 134  Recent Lipid Panel    Component Value Date/Time   CHOL 127 02/17/2018 1014   TRIG 80 02/17/2018 1014   HDL 41 02/17/2018 1014   CHOLHDL 3.1 02/17/2018 1014   VLDL 16 02/17/2018 1014   LDLCALC 70 02/17/2018 1014    Physical Exam:    VS:  BP 134/80   Pulse 82   Ht 5\' 8"  (1.727 m)   Wt 200 lb 12.8 oz (91.1 kg)   BMI 30.53 kg/m     Wt Readings from Last 3 Encounters:  06/21/19 200 lb 12.8 oz (91.1 kg)  05/11/19 194 lb (88 kg)  03/13/19 194 lb 6.4 oz (88.2 kg)    GEN: Well nourished, well developed in no acute distress HEENT: Normal, moist mucous membranes NECK: No JVD CARDIAC: irregularly irregular rhythm, normal S1 and S2, no rubs or gallops. 2/6 SE murmur. VASCULAR: Radial  pulses 2+ bilaterally.  RESPIRATORY:  Clear to auscultation without rales, wheezing or rhonchi  ABDOMEN: Soft, non-tender, non-distended MUSCULOSKELETAL:  Ambulates independently SKIN: Warm and dry, no edema NEUROLOGIC:  Alert and oriented x 3. No focal neuro deficits noted. PSYCHIATRIC:  Normal affect   ASSESSMENT:    1. Permanent atrial fibrillation ([place])   2. Coronary artery disease due to lipid rich plaque   3. S/P CABG x 4   4. History of CEA (carotid endarterectomy)   5. S/P AVR (aortic valve replacement)   6. PAD (peripheral artery disease) ([place])   7. Essential hypertension   8. Polycythemia vera ([facility])    PLAN:    S/P R CEA, CAD s/p CABG x4, AVR (for bicuspid AV stenosis) 12/2017:  -continue metoprolol succinate 25 mg daily. -on 81 mg aspirin for valve and CAD -continue rosuvastatin 20 mg. Previously not on atorvastatin due to interaction w/nefazodone, but now off of this. -had genital infection on jardiance. Consider GLP1RA for diabetes/ASCVD benefit -has endocarditis prophylaxis antibiotics (amoxicillin) though has dentures -echo with EF 60-65%, valve functioning well  Atrial fibrillation, permanent: goal of rate control -chadsvasc=3, s/p LA clip. His polycythemia also places him at higher risk of clot as well.  -Continue rate control with metoprolol, anticoagulation with apixaban  PAD: stable symptoms -has quit smoking. On aspirin and statin.  -followed by vascular for PAD, carotid disease  Hypertension: at goal, avoid hypotension give PAD pressure differences -continue metoprolol  Hyperlipidemia: goal LDL <70 -check fasting lipid labs with next cancer center blood draw if possible, otherwise check at followup  Follow up 6 mos or sooner PRN  Medication Adjustments/Labs and Tests Ordered: Current medicines are reviewed at length with the patient today.  Concerns regarding medicines are outlined above.  Orders Placed This Encounter  Procedures  . EKG 12-Lead  No orders of the defined types were placed in this encounter.   Patient Instructions  Medication Instructions:  Your Physician recommend you continue on your current medication as directed.    *If you need a refill on your cardiac medications before your next appointment, please call your pharmacy*   Lab Work: None   Testing/Procedures: None   Follow-Up: At [facility], you and your health needs are our priority.  As part of our continuing mission to provide you with exceptional heart care, we have created designated Provider Care Teams.  These Care Teams include your primary Cardiologist (physician) and Advanced Practice Providers (APPs -  Physician Assistants and Nurse Practitioners) who all work together to provide you with the care you need, when you need it.  We recommend signing up for the patient portal called "MyChart".  Sign up information is provided on this After Visit Summary.  MyChart is used to connect with patients for Virtual Visits (Telemedicine).  Patients are able to view lab/test results, encounter notes, upcoming appointments, etc.  Non-urgent messages can be sent to your provider as well.   To learn more about what you can do with MyChart, go to [email or web address].    Your next appointment:   6 month(s)  The format for your next appointment:   In Person  Provider:   Buford Dresser, MD      Signed, Buford Dresser, MD PhD 06/21/2019    [place]

## 2019-06-21 NOTE — Patient Instructions (Signed)

## 2019-06-23 DIAGNOSIS — E119 Type 2 diabetes mellitus without complications: Secondary | ICD-10-CM | POA: Diagnosis not present

## 2019-07-11 ENCOUNTER — Encounter: Payer: Self-pay | Admitting: Family

## 2019-07-11 ENCOUNTER — Inpatient Hospital Stay: Payer: Medicare Other | Attending: Hematology and Oncology

## 2019-07-11 ENCOUNTER — Other Ambulatory Visit: Payer: Self-pay

## 2019-07-11 ENCOUNTER — Inpatient Hospital Stay (HOSPITAL_BASED_OUTPATIENT_CLINIC_OR_DEPARTMENT_OTHER): Payer: Medicare Other | Admitting: Family

## 2019-07-11 VITALS — BP 140/70 | HR 88 | Temp 98.8°F | Resp 18 | Ht 68.0 in | Wt 198.0 lb

## 2019-07-11 DIAGNOSIS — D473 Essential (hemorrhagic) thrombocythemia: Secondary | ICD-10-CM | POA: Insufficient documentation

## 2019-07-11 DIAGNOSIS — D72829 Elevated white blood cell count, unspecified: Secondary | ICD-10-CM | POA: Insufficient documentation

## 2019-07-11 LAB — CMP (CANCER CENTER ONLY)
ALT: 10 U/L (ref 0–44)
AST: 23 U/L (ref 15–41)
Albumin: 4 g/dL (ref 3.5–5.0)
Alkaline Phosphatase: 128 U/L — ABNORMAL HIGH (ref 38–126)
Anion gap: 8 (ref 5–15)
BUN: 10 mg/dL (ref 8–23)
CO2: 28 mmol/L (ref 22–32)
Calcium: 10.1 mg/dL (ref 8.9–10.3)
Chloride: 99 mmol/L (ref 98–111)
Creatinine: 0.84 mg/dL (ref 0.61–1.24)
GFR, Est AFR Am: >60 mL/min (ref >60)
GFR, Estimated: >60 mL/min (ref >60)
Glucose, Bld: 145 mg/dL — ABNORMAL HIGH (ref 70–99)
Potassium: 4.9 mmol/L (ref 3.5–5.1)
Sodium: 135 mmol/L (ref 135–145)
Total Bilirubin: 0.7 mg/dL (ref 0.3–1.2)
Total Protein: 8 g/dL (ref 6.5–8.1)

## 2019-07-11 LAB — CBC WITH DIFFERENTIAL (CANCER CENTER ONLY)
Abs Immature Granulocytes: 3.22 10*3/uL — ABNORMAL HIGH (ref 0.00–0.07)
Basophils Absolute: 0.7 10*3/uL — ABNORMAL HIGH (ref 0.0–0.1)
Basophils Relative: 2 %
Eosinophils Absolute: 0.1 10*3/uL (ref 0.0–0.5)
Eosinophils Relative: 0 %
HCT: 47.5 % (ref 39.0–52.0)
Hemoglobin: 15.4 g/dL (ref 13.0–17.0)
Immature Granulocytes: 8 %
Lymphocytes Relative: 4 %
Lymphs Abs: 1.8 10*3/uL (ref 0.7–4.0)
MCH: 32 pg (ref 26.0–34.0)
MCHC: 32.4 g/dL (ref 30.0–36.0)
MCV: 98.5 fL (ref 80.0–100.0)
Monocytes Absolute: 2.1 10*3/uL — ABNORMAL HIGH (ref 0.1–1.0)
Monocytes Relative: 5 %
Neutro Abs: 34.5 10*3/uL — ABNORMAL HIGH (ref 1.7–7.7)
Neutrophils Relative %: 81 %
Platelet Count: 273 10*3/uL (ref 150–400)
RBC: 4.82 MIL/uL (ref 4.22–5.81)
RDW: 21.1 % — ABNORMAL HIGH (ref 11.5–15.5)
WBC Count: 42.4 10*3/uL — ABNORMAL HIGH (ref 4.0–10.5)
nRBC: 0.2 % (ref 0.0–0.2)

## 2019-07-11 LAB — LACTATE DEHYDROGENASE: LDH: 456 U/L — ABNORMAL HIGH (ref 98–192)

## 2019-07-11 LAB — SAVE SMEAR(SSMR), FOR PROVIDER SLIDE REVIEW

## 2019-07-11 NOTE — Progress Notes (Signed)
Hematology and Oncology Follow Up Visit  Justin [name] [id number] 12-05-1955 64 y.o. 07/11/2019   Principle Diagnosis:  Essential thrombocythemia, JAK2 V617 mutation+; high risk (age >73) - Previous patient of Dr. Audelia Hives   Past Work-up: 09/2018: progressive leukocytosis; BM bx results below ? Hypercellular marrow with myeloid hyperplasia and increased atypical megakaryocytes, clusters of megakaryocytes, increased reticulin fibers (MF 1 of 3); no increase in blast population  ? 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 Molecular studies positive for JAK2 V617F (87%) and ASXL1 mutations (42%); no CALR mutation ? No lymphadenopathy or malignancy on CT; splenomegaly (16cm)  Current Therapy:   Hydrea 500 mg alternating 500 mg PO daily with 1000 mg PO daily   Interim History:  Mr. Montanye is here today for follow-up. He is doing well but has recently had several squamous cell skin cancers removed. His dermatologist Dr. Allyn Kenner would like to start him on a chemotherapy cream. I spoke with Dr. Marin Olp and it is felt that that would be fine.  WBC count is 42.4, Hgb 15.5, MCV 98 and platelets 273.  No fever, chills, n/v, cough, rash, dizziness, SOB, chest pain, palpitations, abdominal pain or changes in bowel or bladder habits.  No swelling in his extremities.  He states that he has bone on bone in the left hip and is waiting to have a replacement later this year. As a result he has intermittent tenderness, numbness and tingling in the left leg.  No falls or syncopal episodes to report.  He denies any episodes of bleeding. No abnormal bruising or petechiae.   ECOG Performance Status: 1 - Symptomatic but completely ambulatory  Medications:  Allergies as of 07/11/2019      Reactions   Codeine Swelling   Tolerates hydrocodone, tramadol, and oxycodone       Medication List       Accurate as of July 11, 2019 11:03 AM. If you have any questions, ask your nurse or doctor.        amoxicillin  500 MG capsule Commonly known as: AMOXIL Take 1 capsule (500 mg total) by mouth once as needed for up to 4 doses. Take four 500 mg capsules by mouth 1 hour before dental visits   apixaban 5 MG Tabs tablet Commonly known as: Eliquis Take 1 tablet (5 mg total) by mouth 2 (two) times daily.   aspirin 81 MG tablet Take 81 mg by mouth daily.   BLUE-EMU PAIN RELIEF DRY EX Apply 1 application topically daily as needed (pain).   Centrum Adults Tabs Take 1 capsule by mouth daily.   Cholecalciferol 125 MCG (5000 UT) capsule Take 5,000 Units by mouth daily.   cyclobenzaprine 10 MG tablet Commonly known as: FLEXERIL Take 10 mg by mouth 3 (three) times daily.   ferrous sulfate 325 (65 FE) MG tablet Take 1 tablet (325 mg total) by mouth 2 (two) times daily with a meal.   furosemide 20 MG tablet Commonly known as: LASIX Take 20 mg by mouth daily as needed for fluid.   HYDROcodone-acetaminophen 7.5-325 MG tablet Commonly known as: NORCO TAKE 1 2 TABLETS BY MOUTH AS NEEDED EVERY 4 HOURS   hydroxyurea 500 MG capsule Commonly known as: HYDREA TAKE 1 CAPSULE BY MOUTH 2 TIMES DAILY. MAY TAKE WITH FOOD TO MINIMIZE GI SIDE EFFECTS. What changed: See the new instructions.   loratadine 10 MG tablet Commonly known as: CLARITIN Take 10 mg by mouth daily.   metFORMIN 500 MG 24 hr tablet Commonly known as: GLUCOPHAGE-XR Take 500  mg by mouth daily.   methocarbamol 500 MG tablet Commonly known as: ROBAXIN Take 1 tablet (500 mg total) by mouth every 6 (six) hours as needed for muscle spasms.   metoprolol succinate 25 MG 24 hr tablet Commonly known as: TOPROL-XL TAKE 1 TABLET BY MOUTH EVERY DAY   pantoprazole 40 MG tablet Commonly known as: PROTONIX Take 1 tablet (40 mg total) by mouth daily.   potassium chloride 10 MEQ CR capsule Commonly known as: MICRO-K Take 10 mEq by mouth daily as needed (take when taking lasix).   pregabalin 50 MG capsule Commonly known as: LYRICA Take 50 mg  by mouth 3 (three) times daily.   REFRESH TEARS OP Place 1-2 drops into both eyes daily as needed (dry eyes).   rosuvastatin 20 MG tablet Commonly known as: CRESTOR Take 1 tablet (20 mg total) by mouth every evening.   zolpidem 10 MG tablet Commonly known as: AMBIEN Take 10 mg by mouth at bedtime.       Allergies:  Allergies  Allergen Reactions  . Codeine Swelling    Tolerates hydrocodone, tramadol, and oxycodone     Past Medical History, Surgical history, Social history, and Family History were reviewed and updated.  Review of Systems: All other 10 point review of systems is negative.   Physical Exam:  height is 5\' 8"  (1.727 m) and weight is 198 lb (89.8 kg). His oral temperature is 98.8 F (37.1 C). His blood pressure is 140/70 and his pulse is 88. His respiration is 18 and oxygen saturation is 98%.   Wt Readings from Last 3 Encounters:  07/11/19 198 lb (89.8 kg)  06/21/19 200 lb 12.8 oz (91.1 kg)  05/11/19 194 lb (88 kg)    Ocular: Sclerae unicteric, pupils equal, round and reactive to light Ear-nose-throat: Oropharynx clear, dentition fair Lymphatic: No cervical or supraclavicular adenopathy Lungs no rales or rhonchi, good excursion bilaterally Heart regular rate and rhythm, no murmur appreciated Abd soft, nontender, positive bowel sounds, no liver or spleen tup palpated on exma, no fluid wave  MSK no focal spinal tenderness, no joint edema Neuro: non-focal, well-oriented, appropriate affect Breasts: Deferred   Lab Results  Component Value Date   WBC 42.4 (H) 07/11/2019   HGB 15.4 07/11/2019   HCT 47.5 07/11/2019   MCV 98.5 07/11/2019   PLT 273 07/11/2019   Lab Results  Component Value Date   FERRITIN 121 11/03/2018   IRON 31 (L) 11/03/2018   TIBC 284 11/03/2018   UIBC 253 11/03/2018   IRONPCTSAT 11 (L) 11/03/2018   Lab Results  Component Value Date   RETICCTPCT 5.4 (H) 08/13/2018   RBC 4.82 07/11/2019   No results found for: KPAFRELGTCHN,  LAMBDASER, KAPLAMBRATIO No results found for: IGGSERUM, IGA, IGMSERUM No results found for: Odetta Pink, SPEI   Chemistry      Component Value Date/Time   NA 135 07/11/2019 0952   K 4.9 07/11/2019 0952   CL 99 07/11/2019 0952   CO2 28 07/11/2019 0952   BUN 10 07/11/2019 0952   CREATININE 0.84 07/11/2019 0952      Component Value Date/Time   CALCIUM 10.1 07/11/2019 0952   ALKPHOS 128 (H) 07/11/2019 0952   AST 23 07/11/2019 0952   ALT 10 07/11/2019 0952   BILITOT 0.7 07/11/2019 0952       Impression and Plan: Mr. Shoultz is a very pleasant 64 yo caucasian gentleman with essential thrombocythemia, JAK2 V617 mutation +, high risk (  age > 31).  He is tolerating treatment with Hydrea well and has no complaints at this time.  From our standpoint, he is ok to start using the chemo cream as well. I will forward today's note to Dr. Nevada Crane so he is aware.  We will plan to see Mr. Kroner again in another 3 months.  He will contact our office with any questions or concerns. We can certainly see him sooner if needed.   Laverna Peace, NP 6/22/202111:03 AM

## 2019-07-18 ENCOUNTER — Encounter (HOSPITAL_COMMUNITY): Payer: Medicare Other

## 2019-07-18 ENCOUNTER — Ambulatory Visit: Payer: Medicare Other | Admitting: Vascular Surgery

## 2019-07-19 DIAGNOSIS — D473 Essential (hemorrhagic) thrombocythemia: Secondary | ICD-10-CM | POA: Diagnosis not present

## 2019-07-19 DIAGNOSIS — Z0000 Encounter for general adult medical examination without abnormal findings: Secondary | ICD-10-CM | POA: Diagnosis not present

## 2019-07-19 DIAGNOSIS — E119 Type 2 diabetes mellitus without complications: Secondary | ICD-10-CM | POA: Diagnosis not present

## 2019-07-19 DIAGNOSIS — I4891 Unspecified atrial fibrillation: Secondary | ICD-10-CM | POA: Diagnosis not present

## 2019-08-01 ENCOUNTER — Encounter: Payer: Self-pay | Admitting: Cardiology

## 2019-08-01 ENCOUNTER — Other Ambulatory Visit: Payer: Self-pay

## 2019-08-01 DIAGNOSIS — I6523 Occlusion and stenosis of bilateral carotid arteries: Secondary | ICD-10-CM

## 2019-08-01 NOTE — Progress Notes (Signed)
Opened in Error.

## 2019-08-08 ENCOUNTER — Encounter: Payer: Self-pay | Admitting: Vascular Surgery

## 2019-08-08 ENCOUNTER — Encounter: Payer: Self-pay | Admitting: Surgery

## 2019-08-08 ENCOUNTER — Other Ambulatory Visit: Payer: Self-pay

## 2019-08-08 ENCOUNTER — Ambulatory Visit (INDEPENDENT_AMBULATORY_CARE_PROVIDER_SITE_OTHER): Payer: Medicare Other | Admitting: Vascular Surgery

## 2019-08-08 ENCOUNTER — Ambulatory Visit (HOSPITAL_COMMUNITY)
Admission: RE | Admit: 2019-08-08 | Discharge: 2019-08-08 | Disposition: A | Discharge status: Home / Self Care | Payer: Medicare Other | Source: Ambulatory Visit | Attending: Surgery | Admitting: Surgery

## 2019-08-08 VITALS — BP 138/82 | HR 82 | Temp 98.1°F | Resp 18 | Ht 68.0 in | Wt 199.0 lb

## 2019-08-08 DIAGNOSIS — I6523 Occlusion and stenosis of bilateral carotid arteries: Secondary | ICD-10-CM | POA: Diagnosis not present

## 2019-08-08 DIAGNOSIS — I739 Peripheral vascular disease, unspecified: Secondary | ICD-10-CM

## 2019-08-08 NOTE — Progress Notes (Signed)
Patient name: Justin [name] MRN: [id number] DOB: [birth year] Sex: male  REASON FOR VISIT: 1 year follow-up after right carotid endarterectomy  HPI: Justin [name] is a 64 y.o. male that presents for 1 year follow-up after right carotid endarterectomy.  Patient was previously under the care of Dr. Prescott Gum and during cardiac work-up was found to have high-grade right ICA stenosis with contralateral occlusion.  Ultimately underwent coronary artery bypass grafting x4 with aortic valve replacement on 01/03/2018 and had a right carotid endarterectomy at the same time by myself.  He did well during his postoperative course with no major issues. He presents today for continued surveillance.  He reports no issues over the last 6 months.  Still taking aspirin and his Eliquis for A. Fib.  He did have right total hip replacement in December without any major complications.  No recent neurologic events and appears to be at his baseline.  Past Medical History:  Diagnosis Date  . Acute meniscal tear of knee LEFT  . Anemia   . Aortic stenosis 12/01/2017   NONRHEUMATIC, AORTIC VALVE CALCIFICATIONS, MILD TO MODERATE REGURG, MILD TO MODERATE CALCIFIED ANNULUS per ECHO 10/25/17 @ [facility]  . Arthritis   . Atrial fibrillation ([place]) 11/12/2017   AT O/V WITH PCP  . Cancer ([place])    skin right arm  . Coronary artery disease   . DM (diabetes mellitus) ([place])   . Heart murmur MILD-- ASYMPTOMATIC  . History of kidney stones   . Hyperlipidemia   . Hypertension   . Left knee pain   . PAD (peripheral artery disease) ([facility])    left leg claudication    Past Surgical History:  Procedure Laterality Date  . AORTIC VALVE REPLACEMENT N/A 01/03/2018   Procedure: AORTIC VALVE REPLACEMENT (AVR) using 35mm Magna Ease Bioprosthesis Aortic Valve;  Surgeon: Ivin Poot, MD;  Location: [facility];  Service: Open Heart Surgery;  Laterality: N/A;  . APPENDECTOMY  1998  . BIOPSY  08/11/2018   Procedure:  BIOPSY;  Surgeon: Mauri Pole, MD;  Location: [facility];  Service: Endoscopy;;  . CATARACT EXTRACTION W/ INTRAOCULAR LENS  IMPLANT, BILATERAL  1998/  2000  . CERVICAL FUSION  1985   C4 - 5  . CORONARY ARTERY BYPASS GRAFT N/A 01/03/2018   Procedure: CORONARY ARTERY BYPASS GRAFTING (CABG) x 4 (LIMA to LAD, SVG to DIAGONAL, SVG to RAMUS INTERMEDIATE, and SVG to PDA), USING LEFT INFTERNAL MAMMARY ARTERY AND;  Surgeon: Prescott Gum, Collier Salina, MD;  Location: [facility];  Service: Open Heart Surgery;  Laterality: N/A;  . ENDARTERECTOMY Right 01/03/2018   Procedure: ENDARTERECTOMY CAROTID;  Surgeon: Marty Heck, MD;  Location: [facility] OR;  Service: Vascular;  Laterality: Right;  . ESOPHAGOGASTRODUODENOSCOPY (EGD) WITH PROPOFOL N/A 08/11/2018   Procedure: ESOPHAGOGASTRODUODENOSCOPY (EGD) WITH PROPOFOL;  Surgeon: Mauri Pole, MD;  Location: [facility];  Service: Endoscopy;  Laterality: N/A;  . KNEE ARTHROSCOPY W/ MENISCECTOMY  1991  1987   LEFT KNEE   x 2  . NASAL SINUS SURGERY  1982  . RIGHT/LEFT HEART CATH AND CORONARY ANGIOGRAPHY N/A 12/24/2017   Procedure: RIGHT/LEFT HEART CATH AND CORONARY ANGIOGRAPHY;  Surgeon: Jolaine Artist, MD;  Location: [facility];  Service: Cardiovascular;  Laterality: N/A;  . ROTATOR CUFF REPAIR  09-04-2005   LEFT SHOULDER  . TEE WITHOUT CARDIOVERSION N/A 12/24/2017   Procedure: TRANSESOPHAGEAL ECHOCARDIOGRAM (TEE);  Surgeon: Jolaine Artist, MD;  Location: [facility];  Service: Cardiovascular;  Laterality:  N/A;  . TEE WITHOUT CARDIOVERSION N/A 01/03/2018   Procedure: TRANSESOPHAGEAL ECHOCARDIOGRAM (TEE);  Surgeon: Prescott Gum, Collier Salina, MD;  Location: [facility];  Service: Open Heart Surgery;  Laterality: N/A;  . TONSILLECTOMY  AGE 24  . TOTAL HIP ARTHROPLASTY Right 12/23/2018   Procedure: TOTAL HIP ARTHROPLASTY ANTERIOR APPROACH;  Surgeon: Paralee Cancel, MD;  Location: [facility];  Service: Orthopedics;  Laterality: Right;  70 mins    Family History    Adopted: Yes  Problem Relation Age of Onset  . Bladder Cancer Father        adopted father    SOCIAL HISTORY: Social History   Tobacco Use  . Smoking status: Former Smoker    Packs/day: 0.25    Years: 23.00    Pack years: 5.75    Types: Cigars    Quit date: 01/02/2018    Years since quitting: 1.5  . Smokeless tobacco: Never Used  . Tobacco comment: quit cigs in 2011 and smokes cigars daily- from 3-10 cigars-09/27/13  Substance Use Topics  . Alcohol use: Not Currently    Alcohol/week: 0.0 standard drinks    Comment: occasional    Allergies  Allergen Reactions  . Codeine Swelling    Tolerates hydrocodone, tramadol, and oxycodone     Current Outpatient Medications  Medication Sig Dispense Refill  . apixaban (ELIQUIS) 5 MG TABS tablet Take 1 tablet (5 mg total) by mouth 2 (two) times daily. 60 tablet   . aspirin 81 MG tablet Take 81 mg by mouth daily.     . Carboxymethylcellulose Sodium (REFRESH TEARS OP) Place 1-2 drops into both eyes daily as needed (dry eyes).    . Cholecalciferol 125 MCG (5000 UT) capsule Take 5,000 Units by mouth daily.     . cyclobenzaprine (FLEXERIL) 10 MG tablet Take 10 mg by mouth 3 (three) times daily.     . ferrous sulfate 325 (65 FE) MG tablet Take 1 tablet (325 mg total) by mouth 2 (two) times daily with a meal. 60 tablet 0  . furosemide (LASIX) 20 MG tablet Take 20 mg by mouth daily as needed for fluid.     Marland Kitchen HYDROcodone-acetaminophen (NORCO) 7.5-325 MG tablet TAKE 1 2 TABLETS BY MOUTH AS NEEDED EVERY 4 HOURS    . hydroxyurea (HYDREA) 500 MG capsule TAKE 1 CAPSULE BY MOUTH 2 TIMES DAILY. MAY TAKE WITH FOOD TO MINIMIZE GI SIDE EFFECTS. (Patient taking differently: daily. Take 1 tablet one day and then 2 tablet the other day. Alternate) 180 capsule 2  . Lidocaine (BLUE-EMU PAIN RELIEF DRY EX) Apply 1 application topically daily as needed (pain).    Marland Kitchen loratadine (CLARITIN) 10 MG tablet Take 10 mg by mouth daily.    . metFORMIN (GLUCOPHAGE-XR) 500 MG  24 hr tablet Take 500 mg by mouth daily.     . methocarbamol (ROBAXIN) 500 MG tablet Take 1 tablet (500 mg total) by mouth every 6 (six) hours as needed for muscle spasms. 40 tablet 0  . metoprolol succinate (TOPROL-XL) 25 MG 24 hr tablet TAKE 1 TABLET BY MOUTH EVERY DAY 90 tablet 3  . Multiple Vitamins-Minerals (CENTRUM ADULTS) TABS Take 1 capsule by mouth daily.    . pantoprazole (PROTONIX) 40 MG tablet Take 1 tablet (40 mg total) by mouth daily. 90 tablet 3  . potassium chloride (MICRO-K) 10 MEQ CR capsule Take 10 mEq by mouth daily as needed (take when taking lasix).    . pregabalin (LYRICA) 50 MG capsule Take 50 mg by mouth 3 (  three) times daily.    . rosuvastatin (CRESTOR) 20 MG tablet Take 1 tablet (20 mg total) by mouth every evening. 90 tablet 3  . zolpidem (AMBIEN) 10 MG tablet Take 10 mg by mouth at bedtime.     Marland Kitchen amoxicillin (AMOXIL) 500 MG capsule Take 1 capsule (500 mg total) by mouth once as needed for up to 4 doses. Take four 500 mg capsules by mouth 1 hour before dental visits (Patient not taking: Reported on 07/11/2019) 4 capsule 12   No current facility-administered medications for this visit.    REVIEW OF SYSTEMS:  [X]  denotes positive finding, [ ]  denotes negative finding Cardiac  Comments:  Chest pain or chest pressure:    Shortness of breath upon exertion:    Short of breath when lying flat:    Irregular heart rhythm:        Vascular    Pain in calf, thigh, or hip brought on by ambulation:    Pain in feet at night that wakes you up from your sleep:     Blood clot in your veins:    Leg swelling:         Pulmonary    Oxygen at home:    Productive cough:     Wheezing:         Neurologic    Sudden weakness in arms or legs:     Sudden numbness in arms or legs:     Sudden onset of difficulty speaking or slurred speech:    Temporary loss of vision in one eye:     Problems with dizziness:         Gastrointestinal    Blood in stool:     Vomited blood:           Genitourinary    Burning when urinating:     Blood in urine:        Psychiatric    Major depression:         Hematologic    Bleeding problems:    Problems with blood clotting too easily:        Skin    Rashes or ulcers:        Constitutional    Fever or chills:      PHYSICAL EXAM: Vitals:   08/08/19 1144 08/08/19 1147  BP: 129/81 138/82  Pulse: 82 82  Resp: 18   Temp: 98.1 F (36.7 C)   TempSrc: Temporal   SpO2: 94%   Weight: 199 lb (90.3 kg)   Height: 5\' 8"  (1.727 m)     GENERAL: The patient is a well-nourished male, in no acute distress. The vital signs are documented above. CARDIAC: There is a regular rate and rhythm.  VASCULAR:  Right neck incision well-healed PULMONARY: There is good air exchange bilaterally without wheezing or rales. ABDOMEN: Soft and non-tender with normal pitched bowel sounds.  MUSCULOSKELETAL: There are no major deformities or cyanosis. NEUROLOGIC: No focal weakness or paresthesias are detected.  Cranial nerves II through XII grossly intact.  DATA:   Reviewed his carotid duplex today and his right carotid endarterectomy appears widely patent although some proximal CCA disease not flow limiting and right ECA occluded.  On the left there is bidirectional waveforms with no flow noted distally in the ICA suggesting distal occlusion in the ICA.  Assessment/Plan:  64 year old male presents for 1 year follow-up after right carotid endarterectomy for asymptomatic high-grade disease at the same time as CABG x4/aortic valve replacement on 01/03/18.  The right carotid endarterectomy site continues to look patent.  On the left we have previously evaluated this with CTA given suspected occlusion but then a later duplex that suggested there possibly was some flow in the left ICA.  On duplex today there was no flow seen in the distal ICA with.bidirectional flow more proximal in the ICA suggesting distal occlusion.  We discussed this again in detail and  again discussed I don't think there is any role to surgical revascularization in setting of distal ICA occlusion particularly if he remains asymptomatic.  He will remain on aspirin from my standpoint and I will see him again in 1 year for ongoing surveillance.   Marty Heck, MD [facility] Office: [phone]

## 2019-08-12 ENCOUNTER — Other Ambulatory Visit: Payer: Self-pay | Admitting: Gastroenterology

## 2019-09-19 DIAGNOSIS — D0462 Carcinoma in situ of skin of left upper limb, including shoulder: Secondary | ICD-10-CM | POA: Diagnosis not present

## 2019-09-19 DIAGNOSIS — L570 Actinic keratosis: Secondary | ICD-10-CM | POA: Diagnosis not present

## 2019-09-19 DIAGNOSIS — X32XXXD Exposure to sunlight, subsequent encounter: Secondary | ICD-10-CM | POA: Diagnosis not present

## 2019-09-20 ENCOUNTER — Other Ambulatory Visit: Payer: Self-pay | Admitting: Family

## 2019-10-02 DIAGNOSIS — R109 Unspecified abdominal pain: Secondary | ICD-10-CM | POA: Diagnosis not present

## 2019-10-03 DIAGNOSIS — M545 Low back pain: Secondary | ICD-10-CM | POA: Diagnosis not present

## 2019-10-03 DIAGNOSIS — Z471 Aftercare following joint replacement surgery: Secondary | ICD-10-CM | POA: Diagnosis not present

## 2019-10-03 DIAGNOSIS — M5416 Radiculopathy, lumbar region: Secondary | ICD-10-CM | POA: Diagnosis not present

## 2019-10-03 DIAGNOSIS — Z96641 Presence of right artificial hip joint: Secondary | ICD-10-CM | POA: Diagnosis not present

## 2019-10-04 ENCOUNTER — Encounter (HOSPITAL_COMMUNITY): Payer: Self-pay | Admitting: Emergency Medicine

## 2019-10-04 ENCOUNTER — Emergency Department (HOSPITAL_COMMUNITY): Payer: Medicare Other

## 2019-10-04 ENCOUNTER — Inpatient Hospital Stay (HOSPITAL_COMMUNITY)
Admission: EM | Admit: 2019-10-04 | Discharge: 2019-10-12 | DRG: 957 | Disposition: A | Discharge status: Skilled Nursing Facility | Payer: Medicare Other | Attending: Physician Assistant | Admitting: Physician Assistant

## 2019-10-04 ENCOUNTER — Other Ambulatory Visit: Payer: Self-pay

## 2019-10-04 DIAGNOSIS — Z9181 History of falling: Secondary | ICD-10-CM

## 2019-10-04 DIAGNOSIS — G4700 Insomnia, unspecified: Secondary | ICD-10-CM | POA: Diagnosis present

## 2019-10-04 DIAGNOSIS — S82831A Other fracture of upper and lower end of right fibula, initial encounter for closed fracture: Secondary | ICD-10-CM | POA: Diagnosis not present

## 2019-10-04 DIAGNOSIS — D473 Essential (hemorrhagic) thrombocythemia: Secondary | ICD-10-CM | POA: Diagnosis present

## 2019-10-04 DIAGNOSIS — S36113D Laceration of liver, unspecified degree, subsequent encounter: Secondary | ICD-10-CM | POA: Diagnosis not present

## 2019-10-04 DIAGNOSIS — R338 Other retention of urine: Secondary | ICD-10-CM | POA: Diagnosis not present

## 2019-10-04 DIAGNOSIS — S2243XA Multiple fractures of ribs, bilateral, initial encounter for closed fracture: Secondary | ICD-10-CM | POA: Diagnosis not present

## 2019-10-04 DIAGNOSIS — I4821 Permanent atrial fibrillation: Secondary | ICD-10-CM | POA: Diagnosis not present

## 2019-10-04 DIAGNOSIS — T794XXA Traumatic shock, initial encounter: Secondary | ICD-10-CM | POA: Diagnosis not present

## 2019-10-04 DIAGNOSIS — I480 Paroxysmal atrial fibrillation: Secondary | ICD-10-CM | POA: Diagnosis present

## 2019-10-04 DIAGNOSIS — D649 Anemia, unspecified: Secondary | ICD-10-CM | POA: Diagnosis present

## 2019-10-04 DIAGNOSIS — Z7901 Long term (current) use of anticoagulants: Secondary | ICD-10-CM

## 2019-10-04 DIAGNOSIS — E118 Type 2 diabetes mellitus with unspecified complications: Secondary | ICD-10-CM | POA: Diagnosis not present

## 2019-10-04 DIAGNOSIS — J189 Pneumonia, unspecified organism: Secondary | ICD-10-CM | POA: Diagnosis not present

## 2019-10-04 DIAGNOSIS — I2510 Atherosclerotic heart disease of native coronary artery without angina pectoris: Secondary | ICD-10-CM | POA: Diagnosis not present

## 2019-10-04 DIAGNOSIS — D62 Acute posthemorrhagic anemia: Secondary | ICD-10-CM | POA: Diagnosis not present

## 2019-10-04 DIAGNOSIS — Z419 Encounter for procedure for purposes other than remedying health state, unspecified: Secondary | ICD-10-CM

## 2019-10-04 DIAGNOSIS — I110 Hypertensive heart disease with heart failure: Secondary | ICD-10-CM | POA: Diagnosis present

## 2019-10-04 DIAGNOSIS — R339 Retention of urine, unspecified: Secondary | ICD-10-CM | POA: Diagnosis present

## 2019-10-04 DIAGNOSIS — M6281 Muscle weakness (generalized): Secondary | ICD-10-CM | POA: Diagnosis not present

## 2019-10-04 DIAGNOSIS — R933 Abnormal findings on diagnostic imaging of other parts of digestive tract: Secondary | ICD-10-CM | POA: Diagnosis present

## 2019-10-04 DIAGNOSIS — E119 Type 2 diabetes mellitus without complications: Secondary | ICD-10-CM

## 2019-10-04 DIAGNOSIS — Z953 Presence of xenogenic heart valve: Secondary | ICD-10-CM

## 2019-10-04 DIAGNOSIS — E1165 Type 2 diabetes mellitus with hyperglycemia: Secondary | ICD-10-CM | POA: Diagnosis present

## 2019-10-04 DIAGNOSIS — S82854A Nondisplaced trimalleolar fracture of right lower leg, initial encounter for closed fracture: Secondary | ICD-10-CM | POA: Diagnosis not present

## 2019-10-04 DIAGNOSIS — R2681 Unsteadiness on feet: Secondary | ICD-10-CM | POA: Diagnosis not present

## 2019-10-04 DIAGNOSIS — J942 Hemothorax: Secondary | ICD-10-CM | POA: Diagnosis not present

## 2019-10-04 DIAGNOSIS — Z951 Presence of aortocoronary bypass graft: Secondary | ICD-10-CM

## 2019-10-04 DIAGNOSIS — Z885 Allergy status to narcotic agent status: Secondary | ICD-10-CM

## 2019-10-04 DIAGNOSIS — E871 Hypo-osmolality and hyponatremia: Secondary | ICD-10-CM | POA: Diagnosis not present

## 2019-10-04 DIAGNOSIS — Z9889 Other specified postprocedural states: Secondary | ICD-10-CM | POA: Diagnosis not present

## 2019-10-04 DIAGNOSIS — S271XXA Traumatic hemothorax, initial encounter: Secondary | ICD-10-CM | POA: Diagnosis not present

## 2019-10-04 DIAGNOSIS — U071 COVID-19: Secondary | ICD-10-CM | POA: Diagnosis not present

## 2019-10-04 DIAGNOSIS — E875 Hyperkalemia: Secondary | ICD-10-CM | POA: Diagnosis present

## 2019-10-04 DIAGNOSIS — Z96641 Presence of right artificial hip joint: Secondary | ICD-10-CM | POA: Diagnosis present

## 2019-10-04 DIAGNOSIS — I4891 Unspecified atrial fibrillation: Secondary | ICD-10-CM | POA: Diagnosis not present

## 2019-10-04 DIAGNOSIS — I10 Essential (primary) hypertension: Secondary | ICD-10-CM | POA: Diagnosis not present

## 2019-10-04 DIAGNOSIS — S8251XA Displaced fracture of medial malleolus of right tibia, initial encounter for closed fracture: Secondary | ICD-10-CM | POA: Diagnosis not present

## 2019-10-04 DIAGNOSIS — Z20822 Contact with and (suspected) exposure to covid-19: Secondary | ICD-10-CM | POA: Diagnosis not present

## 2019-10-04 DIAGNOSIS — S36113A Laceration of liver, unspecified degree, initial encounter: Secondary | ICD-10-CM

## 2019-10-04 DIAGNOSIS — S82891A Other fracture of right lower leg, initial encounter for closed fracture: Secondary | ICD-10-CM

## 2019-10-04 DIAGNOSIS — E1151 Type 2 diabetes mellitus with diabetic peripheral angiopathy without gangrene: Secondary | ICD-10-CM | POA: Diagnosis present

## 2019-10-04 DIAGNOSIS — R296 Repeated falls: Secondary | ICD-10-CM | POA: Diagnosis present

## 2019-10-04 DIAGNOSIS — R52 Pain, unspecified: Secondary | ICD-10-CM | POA: Diagnosis not present

## 2019-10-04 DIAGNOSIS — R531 Weakness: Secondary | ICD-10-CM | POA: Diagnosis not present

## 2019-10-04 DIAGNOSIS — Z981 Arthrodesis status: Secondary | ICD-10-CM

## 2019-10-04 DIAGNOSIS — S36114A Minor laceration of liver, initial encounter: Secondary | ICD-10-CM | POA: Diagnosis present

## 2019-10-04 DIAGNOSIS — X501XXA Overexertion from prolonged static or awkward postures, initial encounter: Secondary | ICD-10-CM

## 2019-10-04 DIAGNOSIS — S0191XA Laceration without foreign body of unspecified part of head, initial encounter: Secondary | ICD-10-CM | POA: Diagnosis present

## 2019-10-04 DIAGNOSIS — K661 Hemoperitoneum: Secondary | ICD-10-CM

## 2019-10-04 DIAGNOSIS — S82841A Displaced bimalleolar fracture of right lower leg, initial encounter for closed fracture: Secondary | ICD-10-CM

## 2019-10-04 DIAGNOSIS — S36892A Contusion of other intra-abdominal organs, initial encounter: Secondary | ICD-10-CM | POA: Diagnosis not present

## 2019-10-04 DIAGNOSIS — N179 Acute kidney failure, unspecified: Secondary | ICD-10-CM | POA: Diagnosis not present

## 2019-10-04 DIAGNOSIS — Z87442 Personal history of urinary calculi: Secondary | ICD-10-CM

## 2019-10-04 DIAGNOSIS — D72829 Elevated white blood cell count, unspecified: Secondary | ICD-10-CM | POA: Diagnosis not present

## 2019-10-04 DIAGNOSIS — Z043 Encounter for examination and observation following other accident: Secondary | ICD-10-CM | POA: Diagnosis not present

## 2019-10-04 DIAGNOSIS — G541 Lumbosacral plexus disorders: Secondary | ICD-10-CM | POA: Diagnosis present

## 2019-10-04 DIAGNOSIS — R000 Tachycardia, unspecified: Secondary | ICD-10-CM | POA: Diagnosis not present

## 2019-10-04 DIAGNOSIS — S299XXA Unspecified injury of thorax, initial encounter: Secondary | ICD-10-CM | POA: Diagnosis not present

## 2019-10-04 DIAGNOSIS — E785 Hyperlipidemia, unspecified: Secondary | ICD-10-CM | POA: Diagnosis present

## 2019-10-04 DIAGNOSIS — I499 Cardiac arrhythmia, unspecified: Secondary | ICD-10-CM | POA: Diagnosis not present

## 2019-10-04 DIAGNOSIS — S82854D Nondisplaced trimalleolar fracture of right lower leg, subsequent encounter for closed fracture with routine healing: Secondary | ICD-10-CM | POA: Diagnosis not present

## 2019-10-04 DIAGNOSIS — I482 Chronic atrial fibrillation, unspecified: Secondary | ICD-10-CM | POA: Diagnosis present

## 2019-10-04 DIAGNOSIS — W19XXXA Unspecified fall, initial encounter: Secondary | ICD-10-CM | POA: Diagnosis present

## 2019-10-04 DIAGNOSIS — S82851A Displaced trimalleolar fracture of right lower leg, initial encounter for closed fracture: Principal | ICD-10-CM | POA: Diagnosis present

## 2019-10-04 DIAGNOSIS — R7989 Other specified abnormal findings of blood chemistry: Secondary | ICD-10-CM | POA: Diagnosis present

## 2019-10-04 DIAGNOSIS — M2550 Pain in unspecified joint: Secondary | ICD-10-CM | POA: Diagnosis not present

## 2019-10-04 DIAGNOSIS — S0990XA Unspecified injury of head, initial encounter: Secondary | ICD-10-CM | POA: Diagnosis not present

## 2019-10-04 DIAGNOSIS — Z87891 Personal history of nicotine dependence: Secondary | ICD-10-CM

## 2019-10-04 DIAGNOSIS — S271XXD Traumatic hemothorax, subsequent encounter: Secondary | ICD-10-CM | POA: Diagnosis not present

## 2019-10-04 DIAGNOSIS — R0902 Hypoxemia: Secondary | ICD-10-CM | POA: Diagnosis present

## 2019-10-04 DIAGNOSIS — R58 Hemorrhage, not elsewhere classified: Secondary | ICD-10-CM

## 2019-10-04 DIAGNOSIS — S82851D Displaced trimalleolar fracture of right lower leg, subsequent encounter for closed fracture with routine healing: Secondary | ICD-10-CM | POA: Diagnosis not present

## 2019-10-04 DIAGNOSIS — I5033 Acute on chronic diastolic (congestive) heart failure: Secondary | ICD-10-CM | POA: Diagnosis not present

## 2019-10-04 DIAGNOSIS — W1830XA Fall on same level, unspecified, initial encounter: Secondary | ICD-10-CM | POA: Diagnosis present

## 2019-10-04 DIAGNOSIS — J9811 Atelectasis: Secondary | ICD-10-CM | POA: Diagnosis not present

## 2019-10-04 DIAGNOSIS — Z96661 Presence of right artificial ankle joint: Secondary | ICD-10-CM | POA: Diagnosis not present

## 2019-10-04 DIAGNOSIS — R0989 Other specified symptoms and signs involving the circulatory and respiratory systems: Secondary | ICD-10-CM | POA: Diagnosis present

## 2019-10-04 DIAGNOSIS — R202 Paresthesia of skin: Secondary | ICD-10-CM | POA: Diagnosis not present

## 2019-10-04 DIAGNOSIS — G4730 Sleep apnea, unspecified: Secondary | ICD-10-CM | POA: Diagnosis present

## 2019-10-04 DIAGNOSIS — R269 Unspecified abnormalities of gait and mobility: Secondary | ICD-10-CM | POA: Diagnosis not present

## 2019-10-04 DIAGNOSIS — Z09 Encounter for follow-up examination after completed treatment for conditions other than malignant neoplasm: Secondary | ICD-10-CM

## 2019-10-04 DIAGNOSIS — S93431A Sprain of tibiofibular ligament of right ankle, initial encounter: Secondary | ICD-10-CM | POA: Diagnosis not present

## 2019-10-04 DIAGNOSIS — S2249XA Multiple fractures of ribs, unspecified side, initial encounter for closed fracture: Secondary | ICD-10-CM

## 2019-10-04 DIAGNOSIS — G8918 Other acute postprocedural pain: Secondary | ICD-10-CM | POA: Diagnosis not present

## 2019-10-04 DIAGNOSIS — Z7401 Bed confinement status: Secondary | ICD-10-CM | POA: Diagnosis not present

## 2019-10-04 DIAGNOSIS — K683 Retroperitoneal hematoma: Secondary | ICD-10-CM

## 2019-10-04 LAB — I-STAT CHEM 8, ED
BUN: 37 mg/dL — ABNORMAL HIGH (ref 8–23)
Calcium, Ion: 1.12 mmol/L — ABNORMAL LOW (ref 1.15–1.40)
Chloride: 93 mmol/L — ABNORMAL LOW (ref 98–111)
Creatinine, Ser: 2.6 mg/dL — ABNORMAL HIGH (ref 0.61–1.24)
Glucose, Bld: 159 mg/dL — ABNORMAL HIGH (ref 70–99)
HCT: 30 % — ABNORMAL LOW (ref 39.0–52.0)
Hemoglobin: 10.2 g/dL — ABNORMAL LOW (ref 13.0–17.0)
Potassium: 5.2 mmol/L — ABNORMAL HIGH (ref 3.5–5.1)
Sodium: 125 mmol/L — ABNORMAL LOW (ref 135–145)
TCO2: 23 mmol/L (ref 22–32)

## 2019-10-04 LAB — COMPREHENSIVE METABOLIC PANEL
ALT: 17 U/L (ref 0–44)
AST: 40 U/L (ref 15–41)
Albumin: 3.1 g/dL — ABNORMAL LOW (ref 3.5–5.0)
Alkaline Phosphatase: 113 U/L (ref 38–126)
Anion gap: 12 (ref 5–15)
BUN: 30 mg/dL — ABNORMAL HIGH (ref 8–23)
CO2: 21 mmol/L — ABNORMAL LOW (ref 22–32)
Calcium: 8.7 mg/dL — ABNORMAL LOW (ref 8.9–10.3)
Chloride: 93 mmol/L — ABNORMAL LOW (ref 98–111)
Creatinine, Ser: 1.48 mg/dL — ABNORMAL HIGH (ref 0.61–1.24)
GFR calc Af Amer: 57 mL/min — ABNORMAL LOW (ref >60)
GFR calc non Af Amer: 49 mL/min — ABNORMAL LOW (ref >60)
Glucose, Bld: 150 mg/dL — ABNORMAL HIGH (ref 70–99)
Potassium: 5.2 mmol/L — ABNORMAL HIGH (ref 3.5–5.1)
Sodium: 126 mmol/L — ABNORMAL LOW (ref 135–145)
Total Bilirubin: 1.3 mg/dL — ABNORMAL HIGH (ref 0.3–1.2)
Total Protein: 6.7 g/dL (ref 6.5–8.1)

## 2019-10-04 LAB — CBC
HCT: 25.8 % — ABNORMAL LOW (ref 39.0–52.0)
Hemoglobin: 8.4 g/dL — ABNORMAL LOW (ref 13.0–17.0)
MCH: 33.2 pg (ref 26.0–34.0)
MCHC: 32.6 g/dL (ref 30.0–36.0)
MCV: 102 fL — ABNORMAL HIGH (ref 80.0–100.0)
Platelets: 313 10*3/uL (ref 150–400)
RBC: 2.53 MIL/uL — ABNORMAL LOW (ref 4.22–5.81)
RDW: 20.2 % — ABNORMAL HIGH (ref 11.5–15.5)
WBC: 68.8 10*3/uL (ref 4.0–10.5)
nRBC: 1.5 % — ABNORMAL HIGH (ref 0.0–0.2)

## 2019-10-04 LAB — URINALYSIS, ROUTINE W REFLEX MICROSCOPIC
Bilirubin Urine: NEGATIVE
Glucose, UA: NEGATIVE mg/dL
Hgb urine dipstick: NEGATIVE
Ketones, ur: NEGATIVE mg/dL
Leukocytes,Ua: NEGATIVE
Nitrite: NEGATIVE
Protein, ur: NEGATIVE mg/dL
Specific Gravity, Urine: 1.04 — ABNORMAL HIGH (ref 1.005–1.030)
pH: 5 (ref 5.0–8.0)

## 2019-10-04 LAB — SARS CORONAVIRUS 2 BY RT PCR (HOSPITAL ORDER, PERFORMED IN ~~LOC~~ HOSPITAL LAB): SARS Coronavirus 2: NEGATIVE

## 2019-10-04 LAB — ETHANOL: Alcohol, Ethyl (B): <10 mg/dL (ref <10)

## 2019-10-04 LAB — PREPARE RBC (CROSSMATCH)

## 2019-10-04 LAB — LACTIC ACID, PLASMA: Lactic Acid, Venous: 2.8 mmol/L (ref 0.5–1.9)

## 2019-10-04 LAB — PROTIME-INR
INR: 2.3 — ABNORMAL HIGH (ref 0.8–1.2)
Prothrombin Time: 24.2 seconds — ABNORMAL HIGH (ref 11.4–15.2)

## 2019-10-04 LAB — SAMPLE TO BLOOD BANK

## 2019-10-04 MED ORDER — HYDROMORPHONE HCL 1 MG/ML IJ SOLN
0.5000 mg | INTRAMUSCULAR | Status: DC | PRN
  Administered 2019-10-05 (×3): 1 mg via INTRAVENOUS
  Administered 2019-10-05: 0.5 mg via INTRAVENOUS
  Filled 2019-10-04 (×4): qty 1

## 2019-10-04 MED ORDER — CYCLOBENZAPRINE HCL 10 MG PO TABS
10.0000 mg | ORAL_TABLET | Freq: Three times a day (TID) | ORAL | Status: DC
  Administered 2019-10-05 – 2019-10-12 (×22): 10 mg via ORAL
  Filled 2019-10-04 (×22): qty 1

## 2019-10-04 MED ORDER — IOHEXOL 300 MG/ML  SOLN
100.0000 mL | Freq: Once | INTRAMUSCULAR | Status: AC | PRN
  Administered 2019-10-04: 100 mL via INTRAVENOUS

## 2019-10-04 MED ORDER — INSULIN ASPART 100 UNIT/ML ~~LOC~~ SOLN
0.0000 [IU] | SUBCUTANEOUS | Status: DC
  Administered 2019-10-05 (×3): 1 [IU] via SUBCUTANEOUS
  Administered 2019-10-05: 2 [IU] via SUBCUTANEOUS
  Administered 2019-10-06: 1 [IU] via SUBCUTANEOUS
  Administered 2019-10-07: 3 [IU] via SUBCUTANEOUS
  Administered 2019-10-07: 1 [IU] via SUBCUTANEOUS
  Administered 2019-10-08 – 2019-10-09 (×3): 2 [IU] via SUBCUTANEOUS
  Administered 2019-10-09 – 2019-10-10 (×2): 1 [IU] via SUBCUTANEOUS
  Administered 2019-10-11 (×2): 2 [IU] via SUBCUTANEOUS
  Administered 2019-10-11: 1 [IU] via SUBCUTANEOUS
  Administered 2019-10-11: 2 [IU] via SUBCUTANEOUS
  Administered 2019-10-12: 1 [IU] via SUBCUTANEOUS

## 2019-10-04 MED ORDER — PREGABALIN 25 MG PO CAPS
50.0000 mg | ORAL_CAPSULE | Freq: Three times a day (TID) | ORAL | Status: DC
  Administered 2019-10-05 – 2019-10-12 (×21): 50 mg via ORAL
  Filled 2019-10-04 (×3): qty 2
  Filled 2019-10-04: qty 1
  Filled 2019-10-04 (×8): qty 2
  Filled 2019-10-04: qty 1
  Filled 2019-10-04 (×4): qty 2
  Filled 2019-10-04: qty 1
  Filled 2019-10-04 (×2): qty 2
  Filled 2019-10-04: qty 1

## 2019-10-04 MED ORDER — POLYVINYL ALCOHOL 1.4 % OP SOLN
1.0000 [drp] | Freq: Every day | OPHTHALMIC | Status: DC | PRN
  Filled 2019-10-04 (×2): qty 15

## 2019-10-04 MED ORDER — SODIUM CHLORIDE 0.9 % IV BOLUS
500.0000 mL | Freq: Once | INTRAVENOUS | Status: AC
  Administered 2019-10-04: 500 mL via INTRAVENOUS

## 2019-10-04 MED ORDER — LIDOCAINE HCL URETHRAL/MUCOSAL 2 % EX GEL: 1.0000 "application " | Freq: Once | CUTANEOUS | Status: DC

## 2019-10-04 MED ORDER — POTASSIUM CHLORIDE CRYS ER 10 MEQ PO TBCR: 10.0000 meq | EXTENDED_RELEASE_TABLET | Freq: Every day | ORAL | Status: DC

## 2019-10-04 MED ORDER — FENTANYL CITRATE (PF) 100 MCG/2ML IJ SOLN
100.0000 ug | Freq: Once | INTRAMUSCULAR | Status: AC
  Administered 2019-10-04: 100 ug via INTRAVENOUS
  Filled 2019-10-04: qty 2

## 2019-10-04 MED ORDER — LORATADINE 10 MG PO TABS
10.0000 mg | ORAL_TABLET | Freq: Every day | ORAL | Status: DC
  Administered 2019-10-05 – 2019-10-12 (×7): 10 mg via ORAL
  Filled 2019-10-04 (×8): qty 1

## 2019-10-04 MED ORDER — SODIUM CHLORIDE 0.9 % IV SOLN: INTRAVENOUS | Status: DC

## 2019-10-04 MED ORDER — SODIUM CHLORIDE 0.9% IV SOLUTION: Freq: Once | INTRAVENOUS | Status: DC

## 2019-10-04 MED ORDER — PANTOPRAZOLE SODIUM 40 MG PO TBEC
40.0000 mg | DELAYED_RELEASE_TABLET | Freq: Every day | ORAL | Status: DC
  Administered 2019-10-05 – 2019-10-12 (×7): 40 mg via ORAL
  Filled 2019-10-04 (×7): qty 1

## 2019-10-04 MED ORDER — HYDROCODONE-ACETAMINOPHEN 7.5-325 MG PO TABS
1.0000 | ORAL_TABLET | ORAL | Status: DC | PRN
  Administered 2019-10-05: 2 via ORAL
  Filled 2019-10-04: qty 2

## 2019-10-04 MED ORDER — PROTHROMBIN COMPLEX CONC HUMAN 500 UNITS IV KIT
5143.0000 [IU] | PACK | Status: AC
  Administered 2019-10-04: 5143 [IU] via INTRAVENOUS
  Filled 2019-10-04: qty 5143

## 2019-10-04 MED ORDER — PROCHLORPERAZINE MALEATE 10 MG PO TABS
10.0000 mg | ORAL_TABLET | Freq: Four times a day (QID) | ORAL | Status: DC | PRN
  Filled 2019-10-04: qty 1

## 2019-10-04 MED ORDER — METOPROLOL TARTRATE 5 MG/5ML IV SOLN
5.0000 mg | Freq: Four times a day (QID) | INTRAVENOUS | Status: DC | PRN
  Filled 2019-10-04: qty 5

## 2019-10-04 MED ORDER — ROSUVASTATIN CALCIUM 20 MG PO TABS
20.0000 mg | ORAL_TABLET | Freq: Every day | ORAL | Status: DC
  Administered 2019-10-05 – 2019-10-12 (×7): 20 mg via ORAL
  Filled 2019-10-04 (×7): qty 1

## 2019-10-04 MED ORDER — PROCHLORPERAZINE EDISYLATE 10 MG/2ML IJ SOLN
5.0000 mg | Freq: Four times a day (QID) | INTRAMUSCULAR | Status: DC | PRN
  Administered 2019-10-05: 5 mg via INTRAVENOUS
  Filled 2019-10-04: qty 2

## 2019-10-04 MED ORDER — CARBOXYMETHYLCELLULOSE SODIUM 0.5 % OP SOLN: 1.0000 [drp] | Freq: Every day | OPHTHALMIC | Status: DC | PRN

## 2019-10-04 MED ORDER — ONDANSETRON 4 MG PO TBDP: 4.0000 mg | ORAL_TABLET | Freq: Four times a day (QID) | ORAL | Status: DC | PRN

## 2019-10-04 MED ORDER — METOPROLOL SUCCINATE ER 25 MG PO TB24
25.0000 mg | ORAL_TABLET | Freq: Every day | ORAL | Status: DC
  Administered 2019-10-05 – 2019-10-12 (×7): 25 mg via ORAL
  Filled 2019-10-04 (×7): qty 1

## 2019-10-04 MED ORDER — ONDANSETRON HCL 4 MG/2ML IJ SOLN
4.0000 mg | Freq: Four times a day (QID) | INTRAMUSCULAR | Status: DC | PRN
  Administered 2019-10-05: 4 mg via INTRAVENOUS
  Filled 2019-10-04: qty 2
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
## 2019-10-04 NOTE — Progress Notes (Addendum)
Ortho Trauma Note  I was called by Dr. Vanita Panda for evaluation regarding this patient's right ankle fracture.  He does have a trimalleolar ankle fracture with some posterior subluxation.  I had asked that patient be splinted with a slight reduction maneuver to improve the subluxation.  I have ordered post splint x-rays.  The patient apparently has a retroperitoneal and intraperitoneal bleed that is being evaluated by trauma surgery.  If the patient is admitted I will see him tomorrow with evaluation of his soft tissue to determine whether fixation could be performed acutely versus delayed fixation.  This will also depend on his other injuries.  Shona Needles, MD [facility] [phone] (office) [email or web address]

## 2019-10-04 NOTE — Progress Notes (Signed)
Orthopedic Tech Progress Note Patient Details:  Justin [name] 11-13-1955 [id number]  Ortho Devices Type of Ortho Device: Stirrup splint, Post (short) splint Splint Material: Fiberglass Ortho Device/Splint Location: RLE Ortho Device/Splint Interventions: Ordered, Application, Adjustment   Post Interventions Patient Tolerated: Well Instructions Provided: Care of device, Poper ambulation with device   Brown Dunlap 10/04/2019, 6:37 PM
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
## 2019-10-05 ENCOUNTER — Inpatient Hospital Stay (HOSPITAL_COMMUNITY): Payer: Medicare Other

## 2019-10-05 LAB — COMPREHENSIVE METABOLIC PANEL
ALT: 16 U/L (ref 0–44)
AST: 35 U/L (ref 15–41)
Albumin: 2.9 g/dL — ABNORMAL LOW (ref 3.5–5.0)
Alkaline Phosphatase: 97 U/L (ref 38–126)
Anion gap: 8 (ref 5–15)
BUN: 24 mg/dL — ABNORMAL HIGH (ref 8–23)
CO2: 24 mmol/L (ref 22–32)
Calcium: 8.5 mg/dL — ABNORMAL LOW (ref 8.9–10.3)
Chloride: 95 mmol/L — ABNORMAL LOW (ref 98–111)
Creatinine, Ser: 1.13 mg/dL (ref 0.61–1.24)
GFR calc Af Amer: >60 mL/min (ref >60)
GFR calc non Af Amer: >60 mL/min (ref >60)
Glucose, Bld: 124 mg/dL — ABNORMAL HIGH (ref 70–99)
Potassium: 4.8 mmol/L (ref 3.5–5.1)
Sodium: 127 mmol/L — ABNORMAL LOW (ref 135–145)
Total Bilirubin: 1.5 mg/dL — ABNORMAL HIGH (ref 0.3–1.2)
Total Protein: 6.2 g/dL — ABNORMAL LOW (ref 6.5–8.1)

## 2019-10-05 LAB — GLUCOSE, CAPILLARY
Glucose-Capillary: 120 mg/dL — ABNORMAL HIGH (ref 70–99)
Glucose-Capillary: 124 mg/dL — ABNORMAL HIGH (ref 70–99)
Glucose-Capillary: 129 mg/dL — ABNORMAL HIGH (ref 70–99)
Glucose-Capillary: 143 mg/dL — ABNORMAL HIGH (ref 70–99)
Glucose-Capillary: 143 mg/dL — ABNORMAL HIGH (ref 70–99)
Glucose-Capillary: 143 mg/dL — ABNORMAL HIGH (ref 70–99)
Glucose-Capillary: 177 mg/dL — ABNORMAL HIGH (ref 70–99)

## 2019-10-05 LAB — CBC
HCT: 26.8 % — ABNORMAL LOW (ref 39.0–52.0)
Hemoglobin: 8.7 g/dL — ABNORMAL LOW (ref 13.0–17.0)
MCH: 32.3 pg (ref 26.0–34.0)
MCHC: 32.5 g/dL (ref 30.0–36.0)
MCV: 99.6 fL (ref 80.0–100.0)
Platelets: 263 10*3/uL (ref 150–400)
RBC: 2.69 MIL/uL — ABNORMAL LOW (ref 4.22–5.81)
RDW: 19.6 % — ABNORMAL HIGH (ref 11.5–15.5)
WBC: 52.8 10*3/uL (ref 4.0–10.5)
nRBC: 1.4 % — ABNORMAL HIGH (ref 0.0–0.2)

## 2019-10-05 LAB — HIV ANTIBODY (ROUTINE TESTING W REFLEX): HIV Screen 4th Generation wRfx: NONREACTIVE

## 2019-10-05 LAB — SURGICAL PCR SCREEN
MRSA, PCR: NEGATIVE
Staphylococcus aureus: NEGATIVE

## 2019-10-05 LAB — HEMOGLOBIN A1C
Hgb A1c MFr Bld: 5.8 % — ABNORMAL HIGH (ref 4.8–5.6)
Mean Plasma Glucose: 119.76 mg/dL

## 2019-10-05 LAB — PROTIME-INR
INR: 1.5 — ABNORMAL HIGH (ref 0.8–1.2)
INR: 1.4 — ABNORMAL HIGH (ref 0.8–1.2)
INR: 1.3 — ABNORMAL HIGH (ref 0.8–1.2)
Prothrombin Time: 17.3 seconds — ABNORMAL HIGH (ref 11.4–15.2)
Prothrombin Time: 16.4 s — ABNORMAL HIGH (ref 11.4–15.2)
Prothrombin Time: 15.8 s — ABNORMAL HIGH (ref 11.4–15.2)

## 2019-10-05 LAB — HEMOGLOBIN AND HEMATOCRIT, BLOOD
HCT: 27.2 % — ABNORMAL LOW (ref 39.0–52.0)
HCT: 26.4 % — ABNORMAL LOW (ref 39.0–52.0)
Hemoglobin: 8.9 g/dL — ABNORMAL LOW (ref 13.0–17.0)
Hemoglobin: 8.8 g/dL — ABNORMAL LOW (ref 13.0–17.0)

## 2019-10-05 MED ORDER — HYDROCODONE-ACETAMINOPHEN 7.5-325 MG PO TABS
2.0000 | ORAL_TABLET | ORAL | Status: DC | PRN
  Administered 2019-10-05: 2 via ORAL
  Administered 2019-10-06: 1 via ORAL
  Filled 2019-10-05 (×3): qty 2

## 2019-10-05 MED ORDER — HYDROMORPHONE HCL 1 MG/ML IJ SOLN
0.5000 mg | INTRAMUSCULAR | Status: DC | PRN
  Administered 2019-10-05: 1 mg via INTRAVENOUS
  Administered 2019-10-05: 0.5 mg via INTRAVENOUS
  Administered 2019-10-05: 1 mg via INTRAVENOUS
  Filled 2019-10-05 (×5): qty 1

## 2019-10-05 MED ORDER — BETHANECHOL CHLORIDE 10 MG PO TABS
25.0000 mg | ORAL_TABLET | Freq: Three times a day (TID) | ORAL | Status: DC
  Administered 2019-10-05: 25 mg via ORAL
  Filled 2019-10-05: qty 3

## 2019-10-05 MED ORDER — CHLORHEXIDINE GLUCONATE CLOTH 2 % EX PADS
6.0000 | MEDICATED_PAD | Freq: Every day | CUTANEOUS | Status: DC
  Administered 2019-10-05 – 2019-10-12 (×7): 6 via TOPICAL

## 2019-10-05 MED ORDER — TAMSULOSIN HCL 0.4 MG PO CAPS
0.4000 mg | ORAL_CAPSULE | Freq: Every day | ORAL | Status: DC
  Administered 2019-10-05 – 2019-10-12 (×7): 0.4 mg via ORAL
  Filled 2019-10-05 (×7): qty 1

## 2019-10-05 MED ORDER — BETHANECHOL CHLORIDE 10 MG PO TABS
10.0000 mg | ORAL_TABLET | Freq: Three times a day (TID) | ORAL | Status: DC
  Administered 2019-10-05 (×2): 10 mg via ORAL
  Filled 2019-10-05 (×3): qty 1

## 2019-10-05 NOTE — Progress Notes (Signed)
Patient woke up from sleeping very agitated and confused. Removed IV, pulling at foley catheter, pulled monitor leads off. Patient disoriented to place, situation, and time. Patient refusing to let us reapply monitor leads and oxygen. Dr. Georgette Dover notified. Order received for Haldol 10 mg IM. Patient shortly became more cooperative, let staff reapply leads, blood pressure cuff, and oxygen. Patient now oriented to place and situation. Disoriented to time still.  Patient's wife called and notified. Wife given permission to come see patient at his request. Vitals signs stable and WNL. Will hold off on haldol for now. Once wife arrives will attempt to start another PIV.

## 2019-10-05 NOTE — Progress Notes (Signed)
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 with calcifications  throughout the branch vessels. No acute arterial luminal abnormality is seen. No aneurysm or ectasia. Stranding and hyperdense fluid/hemorrhage tracking along the adventitial surface the infra hepatic IVC (3/69). Low-grade venous injury is not fully excluded. No other major venous abnormalities. Reproductive: Coarse eccentric calcification of the prostate. No concerning abnormalities of the prostate or seminal vesicles. Other: There is a large volume mixed attenuation retroperitoneal hematoma much of which is likely arising from intramuscular hematoma in the right iliopsoas musculature. With additional intramuscular hemorrhage and thickening of the iliacus on the right. No traumatic abdominal wall distension. Additional hemorrhage tracks within the right and posterior retroperitoneal spaces and presacral space. Small volume intraperitoneal hemorrhage is noted superiorly in the right and left subphrenic spaces and dependently within the pelvis. Additional extraperitoneal hemorrhage is noted  in the pelvis as well predominantly in the right sub peritoneal space. Small volume of fluid tracking into a fat containing right inguinal hernia. No traumatic abdominal wall dehiscence or bowel containing hernias. Musculoskeletal: No acute traumatic osseous injury of the lumbar spine. Evidence of prior total right hip arthroplasty with expected alignment of the prosthetic components. The bony pelvis is intact. The left proximal femur is intact and normally located within the acetabulum. Intramuscular thickening and stranding seen about the musculature of the right anterior thigh. IMPRESSION: Traumatic 1. Large volume mixed attenuation retroperitoneal hematoma predominantly within the right retroperitoneal spaces and midline retroperitoneum superiorly. Much this hemorrhage is likely arising from the right iliopsoas and iliacus musculature. With additional stranding and thickening the anterior thigh musculature inferiorly as well. 2. Additional small volume intraperitoneal hemorrhage layering in the subphrenic spaces and deep pelvis. 3. AAST grade 1 liver injury questionable area focal shallow 7 mm laceration involving the tip of the right lobe liver with some adjacent subcapsular hematoma. No other discrete liver injury or concerning liver lesion is identified. 4. Stranding and hyperdense fluid/hemorrhage tracking along the adventitial surface the infra hepatic IVC. Low-grade venous injury is not fully excluded. 5. Focal thickening and mucosal hyperemia of the hepatic flexure with surrounding hemoperitoneum, concerning for a possible contusive injury or injury of the adjacent mesenteric arcade. 6. Fluid about the duodenum is favored to be redistributed without focal thickening or extraluminal gas, though should correlate with close serial abdominal exam. 7. Small right hemothorax. No pneumothorax. Multitude of bilateral rib fractures which appear subacute to chronic, many of which are present on comparison. No acute  displaced rib fractures. Nontraumatic 1. Marked bladder distension without evidence of acute traumatic rupture or other acute bladder rupture. 2. Stable right adrenal adenoma. 3. Hepatic steatosis. 4. Small sliding-type hiatal hernia. Fluid-filled thoracic esophagus, correlate for reflux. 5. Aortic Atherosclerosis (ICD10-I70.0). Electronically Signed   By: Lovena Le M.D.   On: 10/04/2019 20:31   CT Ankle Right Wo Contrast  Result Date: 10/04/2019 CLINICAL DATA:  Characterization of the fracture seen on ankle radiograph, fall chronic coagulation EXAM: CT OF THE RIGHT ANKLE WITHOUT CONTRAST TECHNIQUE: Multidetector CT imaging of the right ankle was performed according to the standard protocol. Multiplanar CT image reconstructions were also generated. COMPARISON:  Same day ankle radiographs. FINDINGS: Bones/Joint/Cartilage Redemonstration of a trimalleolar fracture seen on comparison CT imaging. Fracture components include an obliquely oriented transsyndesmotic fracture of the distal fibula with associated minimally displaced butterfly fragment. There is an a transversely oriented,, mildly angulated and retracted fracture of the medial malleolus. A vertically oriented posterior angulated and mildly displaced fracture of the posterior malleolus is noted as well. There is lateral talar shift and  angulation. No additional fractures are seen in the mid or hindfoot. 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 A background of mild degenerative subchondral sclerosis and spurring is seen about the ankle and hindfoot. Ligaments Suboptimally assessed by CT. Features of this trimalleolar fracture suggest ankle instability. Muscles and Tendons No fully retracted or torn musculature or tendons are identified. There is focal soft tissue thickening around the fibularis longus and brevis in the vicinity of the  fracture lines as well as similar features of the tibialis posterior and flexor digitorum longus tendons adjacent the medial malleolar fracture site. Soft tissues Diffuse circumferential soft tissue swelling of the ankle with small ankle joint effusion. No radiopaque foreign body or soft tissue gas is seen. Atherosclerotic calcification in the vasculature. IMPRESSION: 1. Trimalleolar fracture as described above with features of ankle instability. Constellation of findings are compatible with a Weber B stage IV unstable ankle injury often seen with a supination external rotation mechanism of injury. 2. Diffuse circumferential soft tissue swelling of the ankle with small ankle joint effusion. 3. Focal soft tissue thickening around the fibularis longus and brevis in the vicinity of the lateral malleolus and similar features of the tibialis posterior and flexor digitorum longus tendons adjacent the medial malleolar fracture site. No clearly retracted torn tendons or subluxation. 4. 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 5. Aortic Atherosclerosis (ICD10-I70.0). Electronically Signed   By: Lovena Le M.D.   On: 10/04/2019 18:00   DG Pelvis Portable  Result Date: 10/04/2019 CLINICAL DATA:  Fall, chronic anticoagulation EXAM: PORTABLE PELVIS 1-2 VIEWS COMPARISON:  12/23/2018 FINDINGS: Single view radiograph the pelvis is slightly limited by overlying soft tissue and resultant underpenetration. The pelvis and sacrum are intact. Right total hip arthroplasty has been performed with arthroplasty components overlying the expected position. There is moderate left hip degenerative arthritis, stable since prior examination. No fracture or dislocation noted involving the hips bilaterally. Vascular calcifications are seen within the pelvis and medial thighs bilaterally. IMPRESSION: 1. No acute fracture or  dislocation involving the hips bilaterally. 2. Right total hip arthroplasty. 3. Moderate left hip degenerative arthritis. Electronically Signed   By: Fidela Salisbury MD   On: 10/04/2019 16:47   DG Chest Port 1 View  Result Date: 10/04/2019 CLINICAL DATA:  Hemoperitoneum in atrial fibrillation EXAM: PORTABLE CHEST 1 VIEW COMPARISON:  Radiograph 08/10/2018, CT 12/09/2018 FINDINGS: Low lung volumes with some streaky opacities favoring some mild atelectatic change. More diffuse coarse interstitial opacities are present much of which is similar to prior albeit with some fissural thickening and faint septal lines and minimal central vascular congestion which could reflect some superimposed mild interstitial edematous change. Prior sternotomy CABG. Cardiomediastinal contours are stable from prior counting for differences in technique with a calcified aorta. Cervical carotid atherosclerosis is noted as well. Telemetry leads overlie the chest. No acute osseous or soft tissue abnormality. Degenerative changes are present in the imaged spine and shoulders. IMPRESSION: 1. Findings could suggest a mild interstitial edema with basilar atelectatic changes on a background of more chronic interstitial and bronchitic features. 2. Cervical carotid atherosclerosis. 3. Prior sternotomy, CABG. Electronically Signed   By: Lovena Le M.D.   On: 10/04/2019 18:28   DG Ankle Right Port  Result Date: 10/04/2019 CLINICAL DATA:  Right ankle fracture. EXAM: PORTABLE RIGHT ANKLE - 2 VIEW  COMPARISON:  X-ray right ankle 10/04/2019 4:18 p.m., CT right ankle 10/04/2019 FINDINGS: Interval cast placement with improved anatomical alignment of a mildly angulated medial malleolar fracture and oblique trans syndesmotic distal fibular fracture. Known posterior malleolus fracture not well visualized on this study. There is persistent but slightly improved widening of the medial clear space. Right ankle effusion with improved but persistent mild  posterior subluxation of the tibiotalar articulation. No new acute fracture or dislocation of the bones of the right ankle. Associated subcu soft tissue edema. IMPRESSION: Improved anatomical alignment of a known trimalleolar fracture status post cast placement. Improved but persistent medial space widening and mild tibiotalar posterior subluxation in the setting of an ankle effusion. Electronically Signed   By: Iven Finn M.D.   On: 10/04/2019 21:31   DG Ankle Right Port  Result Date: 10/04/2019 CLINICAL DATA:  Fall, chronic anticoagulation EXAM: PORTABLE RIGHT ANKLE - 2 VIEW COMPARISON:  None. FINDINGS: The examination is markedly limited by suboptimal positioning, particularly the frontal radiograph. Despite this, there is an oblique fracture of the distal fibular diaphysis noted with at least 1 cortical with posteromedial displacement of the distal fracture fragment. There isq irregularity involving the lateral aspect of the distal tibia, not well profiled on this examination. There is posterior subluxation of the tibiotalar articulation with widening of the joint space anteriorly. Right ankle effusion is present. There is soft tissue swelling surrounding the tibiotalar joint. IMPRESSION: 1. Markedly limited examination. 2. Displaced oblique fracture of the distal fibular diaphysis. 3. Possible fracture involving the lateral aspect of the distal tibia. 4. Posterior subluxation of the tibiotalar articulation. 5. These findings could be better assessed with dedicated three view radiograph of the right ankle or, if limited by patient disability, dedicated CT imaging of the right ankle. Electronically Signed   By: Fidela Salisbury MD   On: 10/04/2019 16:51   Scheduled Meds: . sodium chloride   Intravenous Once  . bethanechol  25 mg Oral TID  . Chlorhexidine Gluconate Cloth  6 each Topical Daily  . cyclobenzaprine  10 mg Oral TID  . insulin aspart  0-9 Units Subcutaneous Q4H  . lidocaine  1 application  Topical Once  . loratadine  10 mg Oral Daily  . metoprolol succinate  25 mg Oral Daily  . pantoprazole  40 mg Oral Daily  . pregabalin  50 mg Oral TID  . rosuvastatin  20 mg Oral Daily  . tamsulosin  0.4 mg Oral Daily   Continuous Infusions: . sodium chloride 75 mL/hr at 10/05/19 1100     LOS: 1 day    Time spent: 49min Domenic Polite, MD [facility]  10/05/2019, 11:24 AM

## 2019-10-05 NOTE — Progress Notes (Signed)
Patient ID: Justin [name], male   DOB: [birth year], 64 y.o.   MRN: [id number] Follow up - Trauma Critical Care  Patient Details:    Justin [name] is an 64 y.o. male.  Lines/tubes : Urethral Catheter Melanie RN  Triple-lumen 16 Fr. (Active)  Indication for Insertion or Continuance of Catheter Bladder outlet obstruction / other urologic reason 10/05/19 0800  Site Assessment Clean;Intact;Dry 10/05/19 0800  Catheter Maintenance Bag below level of bladder;Catheter secured;Drainage bag/tubing not touching floor;Insertion date on drainage bag;No dependent loops;Seal intact 10/05/19 0800  Collection Container Standard drainage bag 10/05/19 0800  Securement Method Securing device (Describe) 10/05/19 0800  Urinary Catheter Interventions (if applicable) Unclamped 30/07/62 0800  Output (mL) 750 mL 10/05/19 0600    Microbiology/Sepsis markers: Results for orders placed or performed during the hospital encounter of 10/04/19  SARS Coronavirus 2 by RT PCR (hospital order, performed in [facility] hospital lab) Nasopharyngeal Nasopharyngeal Swab     Status: None   Collection Time: 10/04/19  6:40 PM   Specimen: Nasopharyngeal Swab  Result Value Ref Range Status   SARS Coronavirus 2 NEGATIVE NEGATIVE Final    Comment: (NOTE) SARS-CoV-2 target nucleic acids are NOT DETECTED.  The SARS-CoV-2 RNA is generally detectable in upper and lower respiratory specimens during the acute phase of infection. The lowest concentration of SARS-CoV-2 viral copies this assay can detect is 250 copies / mL. A negative result does not preclude SARS-CoV-2 infection and should not be used as the sole basis for treatment or other patient management decisions.  A negative result may occur with improper specimen collection / handling, submission of specimen other than nasopharyngeal swab, presence of viral mutation(s) within the areas targeted by this assay, and inadequate number of viral copies (<250 copies / mL). A  negative result must be combined with clinical observations, patient history, and epidemiological information.  Fact Sheet for Patients:   StrictlyIdeas.no  Fact Sheet for Healthcare Providers: BankingDealers.co.za  This test is not yet approved or  cleared by the [place] FDA and has been authorized for detection and/or diagnosis of SARS-CoV-2 by FDA under an Emergency Use Authorization (EUA).  This EUA will remain in effect (meaning this test can be used) for the duration of the COVID-19 declaration under Section 564(b)(1) of the Act, 21 U.S.C. section 360bbb-3(b)(1), unless the authorization is terminated or revoked sooner.  Performed at [facility], [place] [address] [place], [place] [address]   Surgical pcr screen     Status: None   Collection Time: 10/05/19 12:04 AM   Specimen: Nasal Mucosa; Nasal Swab  Result Value Ref Range Status   MRSA, PCR NEGATIVE NEGATIVE Final   Staphylococcus aureus NEGATIVE NEGATIVE Final    Comment: (NOTE) The Xpert SA Assay (FDA approved for NASAL specimens in patients 40 years of age and older), is one component of a comprehensive surveillance program. It is not intended to diagnose infection nor to guide or monitor treatment. Performed at [facility], [place] [address] [place],  [address]     Anti-infectives:  Anti-infectives (From admission, onward)   None    Consults: Treatment Team:  Shona Needles, MD    Subjective:    Overnight Issues: some SOB earlier this AM  Objective:  Vital signs for last 24 hours: Temp:  [97.7 F (36.5 C)-98.6 F (37 C)] 97.7 F (36.5 C) (09/16 0800) Pulse Rate:  [95-125] 108 (09/16 0800) Resp:  [17-30] 18 (09/16 0800) BP: (100-138)/(62-98) 113/69 (09/16 0800)  SpO2:  [88 %-95 %] 95 % (09/16 0800) Weight:  [90.7 kg-94.7 kg] 94.7 kg (09/16 0006)  Hemodynamic parameters for last 24 hours:    Intake/Output from previous  day: 09/15 0701 - 09/16 0700 In: 1046.8 [I.V.:700.1; Blood:346.7] Out: 2850 [Urine:2850]  Intake/Output this shift: Total I/O In: 100 [I.V.:100] Out: -   Vent settings for last 24 hours:    Physical Exam:  General: alert and no respiratory distress Neuro: alert, oriented and nonfocal exam HEENT/Neck: no JVD Resp: clear to auscultation bilaterally CVS: IRR GI: soft, mild tend R side Extremities: splint RLE Addendum some decreased LT sens R thigh, cannot assess R ankle Results for orders placed or performed during the hospital encounter of 10/04/19 (from the past 24 hour(s))  Type and screen Ordered by PROVIDER DEFAULT     Status: None (Preliminary result)   Collection Time: 10/04/19  5:54 PM  Result Value Ref Range   ABO/RH(D) A POS    Antibody Screen NEG    Sample Expiration 10/07/2019,2359    Unit Number [id number]    Blood Component Type RED CELLS,LR    Unit division 00    Status of Unit ISSUED    Transfusion Status OK TO TRANSFUSE    Crossmatch Result      Compatible Performed at [facility], [address], [place], [place] [address]   Comprehensive metabolic panel     Status: Abnormal   Collection Time: 10/04/19  5:59 PM  Result Value Ref Range   Sodium 126 (L) 135 - 145 mmol/L   Potassium 5.2 (H) 3.5 - 5.1 mmol/L   Chloride 93 (L) 98 - 111 mmol/L   CO2 21 (L) 22 - 32 mmol/L   Glucose, Bld 150 (H) 70 - 99 mg/dL   BUN 30 (H) 8 - 23 mg/dL   Creatinine, Ser 1.48 (H) 0.61 - 1.24 mg/dL   Calcium 8.7 (L) 8.9 - 10.3 mg/dL   Total Protein 6.7 6.5 - 8.1 g/dL   Albumin 3.1 (L) 3.5 - 5.0 g/dL   AST 40 15 - 41 U/L   ALT 17 0 - 44 U/L   Alkaline Phosphatase 113 38 - 126 U/L   Total Bilirubin 1.3 (H) 0.3 - 1.2 mg/dL   GFR calc non Af Amer 49 (L) >60 mL/min   GFR calc Af Amer 57 (L) >60 mL/min   Anion gap 12 5 - 15  CBC     Status: Abnormal   Collection Time: 10/04/19  5:59 PM  Result Value Ref Range   WBC 68.8 (HH) 4.0 - 10.5 K/uL   RBC 2.53 (L) 4.22 -  5.81 MIL/uL   Hemoglobin 8.4 (L) 13.0 - 17.0 g/dL   HCT 25.8 (L) 39 - 52 %   MCV 102.0 (H) 80.0 - 100.0 fL   MCH 33.2 26.0 - 34.0 pg   MCHC 32.6 30.0 - 36.0 g/dL   RDW 20.2 (H) 11.5 - 15.5 %   Platelets 313 150 - 400 K/uL   nRBC 1.5 (H) 0.0 - 0.2 %  Ethanol     Status: None   Collection Time: 10/04/19  5:59 PM  Result Value Ref Range   Alcohol, Ethyl (B) <10 <10 mg/dL  Lactic acid, plasma     Status: Abnormal   Collection Time: 10/04/19  5:59 PM  Result Value Ref Range   Lactic Acid, Venous 2.8 (HH) 0.5 - 1.9 mmol/L  Protime-INR     Status: Abnormal   Collection Time: 10/04/19  5:59 PM  Result Value Ref Range   Prothrombin Time 24.2 (H) 11.4 - 15.2 seconds   INR 2.3 (H) 0.8 - 1.2  Sample to Blood Bank     Status: None   Collection Time: 10/04/19  5:59 PM  Result Value Ref Range   Blood Bank Specimen SAMPLE AVAILABLE FOR TESTING    Sample Expiration      10/05/2019,2359 Performed at [facility], [place] [address], [place], [place] [address]   I-Stat Chem 8, ED     Status: Abnormal   Collection Time: 10/04/19  6:31 PM  Result Value Ref Range   Sodium 125 (L) 135 - 145 mmol/L   Potassium 5.2 (H) 3.5 - 5.1 mmol/L   Chloride 93 (L) 98 - 111 mmol/L   BUN 37 (H) 8 - 23 mg/dL   Creatinine, Ser 2.60 (H) 0.61 - 1.24 mg/dL   Glucose, Bld 159 (H) 70 - 99 mg/dL   Calcium, Ion 1.12 (L) 1.15 - 1.40 mmol/L   TCO2 23 22 - 32 mmol/L   Hemoglobin 10.2 (L) 13.0 - 17.0 g/dL   HCT 30.0 (L) 39 - 52 %  SARS Coronavirus 2 by RT PCR (hospital order, performed in [facility] hospital lab) Nasopharyngeal Nasopharyngeal Swab     Status: None   Collection Time: 10/04/19  6:40 PM   Specimen: Nasopharyngeal Swab  Result Value Ref Range   SARS Coronavirus 2 NEGATIVE NEGATIVE  Urinalysis, Routine w reflex microscopic Urine, Clean Catch     Status: Abnormal   Collection Time: 10/04/19  8:45 PM  Result Value Ref Range   Color, Urine YELLOW YELLOW   APPearance CLOUDY (A) CLEAR   Specific Gravity,  Urine 1.040 (H) 1.005 - 1.030   pH 5.0 5.0 - 8.0   Glucose, UA NEGATIVE NEGATIVE mg/dL   Hgb urine dipstick NEGATIVE NEGATIVE   Bilirubin Urine NEGATIVE NEGATIVE   Ketones, ur NEGATIVE NEGATIVE mg/dL   Protein, ur NEGATIVE NEGATIVE mg/dL   Nitrite NEGATIVE NEGATIVE   Leukocytes,Ua NEGATIVE NEGATIVE  Prepare RBC (crossmatch)     Status: None   Collection Time: 10/04/19 10:17 PM  Result Value Ref Range   Order Confirmation      ORDER PROCESSED BY BLOOD BANK Performed at [facility], [address], [place], [place] [address]   Surgical pcr screen     Status: None   Collection Time: 10/05/19 12:04 AM   Specimen: Nasal Mucosa; Nasal Swab  Result Value Ref Range   MRSA, PCR NEGATIVE NEGATIVE   Staphylococcus aureus NEGATIVE NEGATIVE  Glucose, capillary     Status: Abnormal   Collection Time: 10/05/19 12:27 AM  Result Value Ref Range   Glucose-Capillary 143 (H) 70 - 99 mg/dL  Glucose, capillary     Status: Abnormal   Collection Time: 10/05/19  3:26 AM  Result Value Ref Range   Glucose-Capillary 129 (H) 70 - 99 mg/dL  CBC     Status: Abnormal   Collection Time: 10/05/19  4:19 AM  Result Value Ref Range   WBC 52.8 (HH) 4.0 - 10.5 K/uL   RBC 2.69 (L) 4.22 - 5.81 MIL/uL   Hemoglobin 8.7 (L) 13.0 - 17.0 g/dL   HCT 26.8 (L) 39 - 52 %   MCV 99.6 80.0 - 100.0 fL   MCH 32.3 26.0 - 34.0 pg   MCHC 32.5 30.0 - 36.0 g/dL   RDW 19.6 (H) 11.5 - 15.5 %   Platelets 263 150 - 400 K/uL  nRBC 1.4 (H) 0.0 - 0.2 %  Comprehensive metabolic panel     Status: Abnormal   Collection Time: 10/05/19  4:19 AM  Result Value Ref Range   Sodium 127 (L) 135 - 145 mmol/L   Potassium 4.8 3.5 - 5.1 mmol/L   Chloride 95 (L) 98 - 111 mmol/L   CO2 24 22 - 32 mmol/L   Glucose, Bld 124 (H) 70 - 99 mg/dL   BUN 24 (H) 8 - 23 mg/dL   Creatinine, Ser 1.13 0.61 - 1.24 mg/dL   Calcium 8.5 (L) 8.9 - 10.3 mg/dL   Total Protein 6.2 (L) 6.5 - 8.1 g/dL   Albumin 2.9 (L) 3.5 - 5.0 g/dL   AST 35 15 - 41 U/L    ALT 16 0 - 44 U/L   Alkaline Phosphatase 97 38 - 126 U/L   Total Bilirubin 1.5 (H) 0.3 - 1.2 mg/dL   GFR calc non Af Amer >60 >60 mL/min   GFR calc Af Amer >60 >60 mL/min   Anion gap 8 5 - 15  Protime-INR     Status: Abnormal   Collection Time: 10/05/19  4:19 AM  Result Value Ref Range   Prothrombin Time 17.3 (H) 11.4 - 15.2 seconds   INR 1.5 (H) 0.8 - 1.2  Hemoglobin A1c     Status: Abnormal   Collection Time: 10/05/19  4:19 AM  Result Value Ref Range   Hgb A1c MFr Bld 5.8 (H) 4.8 - 5.6 %   Mean Plasma Glucose 119.76 mg/dL  Glucose, capillary     Status: Abnormal   Collection Time: 10/05/19  7:45 AM  Result Value Ref Range   Glucose-Capillary 120 (H) 70 - 99 mg/dL    Assessment & Plan: Present on Admission: . Acute blood loss anemia . Essential hypertension . AKI (acute kidney injury) ([place]) . Essential thrombocythemia ([place]) . Leukocytosis . Closed displaced trimalleolar fracture of right ankle . Peritoneal hemorrhage . Atrial fibrillation ([place]) . Acute urinary retention . Fall    LOS: 1 day   [facility] and 2 falls with delayed presentation  Grade 1 liver lac - follow Hb Large R RP hematoma - complicated by anticoagulation on Eliquis (reversed with K-Centra), bedrest now, H&H Q6h, received 1u PRBC on admit Paresthesia from lumbosacral plexus compression secondary to retroperitoneal hemorrhage ABL anemia - see above Afib on Eliquis - see above, home lopressor R HTX - small, Pulm toilet, did almost 1000 on IS, wean O2 as able Acute urinary retention - foley, start Flomax and urecholine Hypernatremia and hyperkalemia - may be due to urinary retention, appreciate TRH management and I D/W the team on the unit. On 0.9 NS now, D/C home K AKI - IVF Chronic leukocytosis R medial mal FX - Dr. Doreatha Martin to eval, possible ORIF tomorrow FEN - heart healthy diet, see above VTE - no anticoagulant, L PAS Dispo - ICU, may be able to transfer later today Critical Care Total Time*: 34  Minutes  Georganna Skeans, MD, MPH, FACS Trauma & General Surgery Use [email or web address] to contact on call provider  10/05/2019  *Care during the described time interval was provided by me. I have reviewed this patient's available data, including medical history, events of note, physical examination and test results as part of my evaluation.
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
## 2019-10-05 NOTE — Progress Notes (Signed)
Patient receiving blood transfusion. Did not receive in ED. Will be able to draw labs after transfusion is complete.

## 2019-10-06 LAB — GLUCOSE, CAPILLARY
Glucose-Capillary: 158 mg/dL — ABNORMAL HIGH (ref 70–99)
Glucose-Capillary: 122 mg/dL — ABNORMAL HIGH (ref 70–99)
Glucose-Capillary: 123 mg/dL — ABNORMAL HIGH (ref 70–99)
Glucose-Capillary: 113 mg/dL — ABNORMAL HIGH (ref 70–99)
Glucose-Capillary: 125 mg/dL — ABNORMAL HIGH (ref 70–99)

## 2019-10-06 LAB — CBC
HCT: 26.5 % — ABNORMAL LOW (ref 39.0–52.0)
Hemoglobin: 8.6 g/dL — ABNORMAL LOW (ref 13.0–17.0)
MCH: 33.5 pg (ref 26.0–34.0)
MCHC: 32.5 g/dL (ref 30.0–36.0)
MCV: 103.1 fL — ABNORMAL HIGH (ref 80.0–100.0)
Platelets: 238 10*3/uL (ref 150–400)
RBC: 2.57 MIL/uL — ABNORMAL LOW (ref 4.22–5.81)
RDW: 20.1 % — ABNORMAL HIGH (ref 11.5–15.5)
WBC: 49.8 10*3/uL — ABNORMAL HIGH (ref 4.0–10.5)
nRBC: 1.4 % — ABNORMAL HIGH (ref 0.0–0.2)

## 2019-10-06 LAB — TYPE AND SCREEN
ABO/RH(D): A POS
Antibody Screen: NEGATIVE
Unit division: 0

## 2019-10-06 LAB — PROTIME-INR
INR: 1.4 — ABNORMAL HIGH (ref 0.8–1.2)
INR: 1.3 — ABNORMAL HIGH (ref 0.8–1.2)
Prothrombin Time: 16.5 s — ABNORMAL HIGH (ref 11.4–15.2)
Prothrombin Time: 16 s — ABNORMAL HIGH (ref 11.4–15.2)

## 2019-10-06 LAB — BASIC METABOLIC PANEL
Anion gap: 9 (ref 5–15)
BUN: 14 mg/dL (ref 8–23)
CO2: 23 mmol/L (ref 22–32)
Calcium: 8.2 mg/dL — ABNORMAL LOW (ref 8.9–10.3)
Chloride: 97 mmol/L — ABNORMAL LOW (ref 98–111)
Creatinine, Ser: 0.81 mg/dL (ref 0.61–1.24)
GFR calc Af Amer: >60 mL/min (ref >60)
GFR calc non Af Amer: >60 mL/min (ref >60)
Glucose, Bld: 135 mg/dL — ABNORMAL HIGH (ref 70–99)
Potassium: 4.7 mmol/L (ref 3.5–5.1)
Sodium: 129 mmol/L — ABNORMAL LOW (ref 135–145)

## 2019-10-06 LAB — HEMOGLOBIN AND HEMATOCRIT, BLOOD
HCT: 26.6 % — ABNORMAL LOW (ref 39.0–52.0)
HCT: 25.5 % — ABNORMAL LOW (ref 39.0–52.0)
HCT: 26.6 % — ABNORMAL LOW (ref 39.0–52.0)
Hemoglobin: 8.7 g/dL — ABNORMAL LOW (ref 13.0–17.0)
Hemoglobin: 8.4 g/dL — ABNORMAL LOW (ref 13.0–17.0)
Hemoglobin: 8.9 g/dL — ABNORMAL LOW (ref 13.0–17.0)

## 2019-10-06 LAB — BPAM RBC
Blood Product Expiration Date: 202110122359
ISSUE DATE / TIME: 202109160037
Unit Type and Rh: 6200

## 2019-10-06 MED ORDER — BETHANECHOL CHLORIDE 5 MG PO TABS
5.0000 mg | ORAL_TABLET | Freq: Three times a day (TID) | ORAL | Status: DC
  Administered 2019-10-06 – 2019-10-08 (×7): 5 mg via ORAL
  Filled 2019-10-06 (×11): qty 1

## 2019-10-06 MED ORDER — CEFAZOLIN SODIUM-DEXTROSE 2-4 GM/100ML-% IV SOLN
2.0000 g | INTRAVENOUS | Status: AC
  Administered 2019-10-07: 2 g via INTRAVENOUS
  Filled 2019-10-06: qty 100

## 2019-10-06 MED ORDER — HYDROCODONE-ACETAMINOPHEN 7.5-325 MG PO TABS
1.0000 | ORAL_TABLET | ORAL | Status: DC | PRN
  Administered 2019-10-06 (×3): 1 via ORAL
  Filled 2019-10-06 (×2): qty 1

## 2019-10-06 NOTE — Anesthesia Preprocedure Evaluation (Addendum)
Anesthesia Evaluation  Patient identified by MRN, date of birth, ID band Patient awake    Reviewed: Allergy & Precautions, NPO status , Patient's Chart, lab work & pertinent test results, reviewed documented beta blocker date and time   History of Anesthesia Complications Negative for: history of anesthetic complications  Airway Mallampati: II  TM Distance: >3 FB Neck ROM: Full    Dental  (+) Edentulous Upper, Edentulous Lower, Dental Advisory Given   Pulmonary sleep apnea , Patient abstained from smoking., former smoker,    Pulmonary exam normal        Cardiovascular hypertension, Pt. on home beta blockers and Pt. on medications + CAD, + CABG and + Peripheral Vascular Disease  + dysrhythmias Atrial Fibrillation + Valvular Problems/Murmurs  Rhythm:Irregular Rate:Normal + Systolic murmurs TTE 06/5991:  1. The left ventricle has normal systolic function with an ejection fraction of 60-65%. The cavity size was normal. There is mildly increased left ventricular wall thickness. Left ventricular diastolic Doppler parameters are indeterminate.  2. The right ventricle has normal systolic function.  3. Left atrial size was moderately dilated.  4. A 49mm an [employer] valve is present in the aortic position.  Procedure Date: 01/03/18.  5. Aortic valve prosthesis appears to open well.    Neuro/Psych S/p cervical fusion negative psych ROS   GI/Hepatic Neg liver ROS, PUD, GERD  Medicated,  Endo/Other  diabetes, Type 2, Oral Hypoglycemic Agents  Renal/GU Renal disease     Musculoskeletal  (+) Arthritis ,   Abdominal   Peds  Hematology  (+) Blood dyscrasia, anemia ,   Anesthesia Other Findings Day of surgery medications reviewed with patient.  Reproductive/Obstetrics                            Anesthesia Physical  Anesthesia Plan  ASA: III  Anesthesia Plan: General   Post-op Pain  Management: GA combined w/ Regional for post-op pain   Induction:   PONV Risk Score and Plan: 2 and Treatment may vary due to age or medical condition, Ondansetron, Midazolam and Dexamethasone  Airway Management Planned: LMA  Additional Equipment: None  Intra-op Plan:   Post-operative Plan: Extubation in OR  Informed Consent: I have reviewed the patients History and Physical, chart, labs and discussed the procedure including the risks, benefits and alternatives for the proposed anesthesia with the patient or authorized representative who has indicated his/her understanding and acceptance.     Dental advisory given  Plan Discussed with: CRNA  Anesthesia Plan Comments: (See PAT note 12/21/2018, Konrad Felix, PA-C)       Anesthesia Quick Evaluation

## 2019-10-06 NOTE — Progress Notes (Signed)
Patient ID: Tywone Bembenek, male   DOB: 08/06/1955, 64 y.o.   MRN: [id number] Day of Surgery   Subjective: Apologizes for confusion last night. Did not need the haldol. Less pain R side. Some improvement in numbness R thigh.  ROS negative except as listed above. Objective: Vital signs in last 24 hours: Temp:  [97.6 F (36.4 C)-99.4 F (37.4 C)] 97.6 F (36.4 C) (09/17 0800) Pulse Rate:  [81-124] 94 (09/17 0800) Resp:  [13-31] 17 (09/17 0700) BP: (99-159)/(46-108) 124/108 (09/17 0800) SpO2:  [87 %-100 %] 91 % (09/17 0800)    Intake/Output from previous day: 09/16 0701 - 09/17 0700 In: 1597.3 [I.V.:1597.3] Out: 750 [Urine:750] Intake/Output this shift: Total I/O In: 70.4 [I.V.:70.4] Out: 750 [Urine:750]  General appearance: alert and cooperative Resp: clear to auscultation bilaterally Cardio: regular rate and rhythm GI: soft, NT, ND Extremities: splint R ankle Neurologic: Mental status: Alert, oriented, thought content appropriate  Dec LT sens R thigh  Lab Results: CBC  Recent Labs    10/05/19 0419 10/05/19 1040 10/06/19 0416 10/06/19 0543  WBC 52.8*  --   --  49.8*  HGB 8.7*   < > 8.7* 8.6*  HCT 26.8*   < > 26.6* 26.5*  PLT 263  --   --  238   < > = values in this interval not displayed.   BMET Recent Labs    10/05/19 0419 10/06/19 0543  NA 127* 129*  K 4.8 4.7  CL 95* 97*  CO2 24 23  GLUCOSE 124* 135*  BUN 24* 14  CREATININE 1.13 0.81  CALCIUM 8.5* 8.2*   PT/INR Recent Labs    10/06/19 0416 10/06/19 0543  LABPROT 16.5* 16.0*  INR 1.4* 1.3*   ABG No results for input(s): PHART, HCO3 in the last 72 hours.  Invalid input(s): PCO2, PO2  Assessment/Plan: M[facility] and 2 falls with delayed presentation  Grade 1 liver lac - follow Hb Large R RP hematoma - complicated by anticoagulation on Eliquis (reversed with K-Centra), Hb stable, will mobilize Paresthesia from lumbosacral plexus compression secondary to retroperitoneal hemorrhage ABL anemia  - see above Afib on Eliquis - see above, home lopressor R HTX - small, Pulm toilet, IS Acute urinary retention - foley, Flomax and urecholine, voiding trial 9/18 Hypernatremia and hyperkalemia - improving, appreciate TRH care AKI - IVF Chronic leukocytosis R Bimal FX - ORIF later today or tomorrow by Dr. Doreatha Martin or Dr. Griffin Basil FEN - NPO for OR VTE - no anticoagulant, L PAS Dispo - to 6N or 5N, OR later today or tomorrow. UOD with PT/OT  LOS: 2 days    Georganna Skeans, MD, MPH, FACS [facility] Use [email or web address] to contact on call provider  10/06/2019

## 2019-10-06 NOTE — Progress Notes (Signed)
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 Fluid-filled distal thoracic esophagus. Distal stomach is unremarkable. Fluid in the retroperitoneum is seen about the duodenal sweep. No focal duodenal thickening or extraluminal gas is evident though a subtle duodenal injury cannot be fully excluded. No small bowel thickening or dilatation seen elsewhere. The appendix is surgically absent. There is some focal thickening and mucosal hyperemia of the hepatic flexure with surrounding intermediate attenuation fluid, possibly hemorrhage. No extraluminal gas is seen. Distal colonic segments have a more normal, unremarkable appearance. Vascular/Lymphatic: Extensive aortoiliac atherosclerosis with calcifications throughout the branch vessels. No acute arterial luminal abnormality is seen. No aneurysm or ectasia. Stranding and hyperdense fluid/hemorrhage tracking along the adventitial surface the infra hepatic IVC (3/69). Low-grade venous injury is not fully excluded. No other major venous abnormalities. Reproductive: Coarse  eccentric calcification of the prostate. No concerning abnormalities of the prostate or seminal vesicles. Other: There is a large volume mixed attenuation retroperitoneal hematoma much of which is likely arising from intramuscular hematoma in the right iliopsoas musculature. With additional intramuscular hemorrhage and thickening of the iliacus on the right. No traumatic abdominal wall distension. Additional hemorrhage tracks within the right and posterior retroperitoneal spaces and presacral space. Small volume intraperitoneal hemorrhage is noted superiorly in the right and left subphrenic spaces and dependently within the pelvis. Additional extraperitoneal hemorrhage is noted in the pelvis as well predominantly in the right sub peritoneal space. Small volume of fluid tracking into a fat containing right inguinal hernia. No traumatic abdominal wall dehiscence or bowel containing hernias. Musculoskeletal: No acute traumatic osseous injury of the lumbar spine. Evidence of prior total right hip arthroplasty with expected alignment of the prosthetic components. The bony pelvis is intact. The left proximal femur is intact and normally located within the acetabulum. Intramuscular thickening and stranding seen about the musculature of the right anterior thigh. IMPRESSION: Traumatic 1. Large volume mixed attenuation retroperitoneal hematoma predominantly within the right retroperitoneal spaces and midline retroperitoneum superiorly. Much this hemorrhage is likely arising from the right iliopsoas and iliacus musculature. With additional stranding and thickening the anterior thigh musculature inferiorly as well. 2. Additional small volume intraperitoneal hemorrhage layering in the subphrenic spaces and deep pelvis. 3. AAST grade 1 liver injury questionable area focal shallow 7 mm laceration involving the tip of the right lobe liver with some adjacent subcapsular hematoma. No other discrete liver injury or concerning liver  lesion is identified. 4. Stranding and hyperdense fluid/hemorrhage tracking along the adventitial surface the infra hepatic IVC. Low-grade venous injury is not fully excluded. 5. Focal thickening and mucosal hyperemia of the hepatic flexure with surrounding hemoperitoneum, concerning for a possible contusive injury or injury of the adjacent mesenteric arcade. 6. Fluid about the duodenum is favored to be redistributed without focal thickening or extraluminal gas, though should correlate with close serial abdominal exam. 7. Small right hemothorax. No pneumothorax. Multitude of bilateral rib fractures which appear subacute to chronic, many of which are present on comparison. No acute displaced rib fractures. Nontraumatic 1. Marked bladder distension without evidence of acute traumatic rupture or other acute bladder rupture. 2. Stable right adrenal adenoma. 3. Hepatic steatosis. 4. Small sliding-type hiatal hernia. Fluid-filled thoracic esophagus, correlate for reflux. 5. Aortic Atherosclerosis (ICD10-I70.0). Electronically Signed   By: Lovena Le M.D.   On: 10/04/2019 20:31  CT Ankle Right Wo Contrast  Result Date: 10/04/2019 CLINICAL DATA:  Characterization of the fracture seen on ankle radiograph, fall chronic coagulation EXAM: CT OF THE RIGHT ANKLE WITHOUT CONTRAST TECHNIQUE: Multidetector CT imaging of the right ankle was performed according to the standard protocol. Multiplanar CT image reconstructions were also generated. COMPARISON:  Same day ankle radiographs. FINDINGS: Bones/Joint/Cartilage Redemonstration of a trimalleolar fracture seen on comparison CT imaging. Fracture components include an obliquely oriented transsyndesmotic fracture of the distal fibula with associated minimally displaced butterfly fragment. There is an a transversely oriented,, mildly angulated and retracted fracture of the medial malleolus. A vertically oriented posterior angulated and mildly displaced fracture of the posterior  malleolus is noted as well. There is lateral talar shift and angulation. No additional fractures are seen in the mid or hindfoot. 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 A background of mild degenerative subchondral sclerosis and spurring is seen about the ankle and hindfoot. Ligaments Suboptimally assessed by CT. Features of this trimalleolar fracture suggest ankle instability. Muscles and Tendons No fully retracted or torn musculature or tendons are identified. There is focal soft tissue thickening around the fibularis longus and brevis in the vicinity of the fracture lines as well as similar features of the tibialis posterior and flexor digitorum longus tendons adjacent the medial malleolar fracture site. Soft tissues Diffuse circumferential soft tissue swelling of the ankle with small ankle joint effusion. No radiopaque foreign body or soft tissue gas is seen. Atherosclerotic calcification in the vasculature. IMPRESSION: 1. Trimalleolar fracture as described above with features of ankle instability. Constellation of findings are compatible with a Weber B stage IV unstable ankle injury often seen with a supination external rotation mechanism of injury. 2. Diffuse circumferential soft tissue swelling of the ankle with small ankle joint effusion. 3. Focal soft tissue thickening around the fibularis longus and brevis in the vicinity of the lateral malleolus and similar features of the tibialis posterior and flexor digitorum longus tendons adjacent the medial malleolar fracture site. No clearly retracted torn tendons or subluxation. 4. 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 5. Aortic Atherosclerosis (ICD10-I70.0). Electronically Signed   By: Lovena Le M.D.   On: 10/04/2019 18:00   DG  Pelvis Portable  Result Date: 10/04/2019 CLINICAL DATA:  Fall, chronic anticoagulation EXAM: PORTABLE PELVIS 1-2 VIEWS COMPARISON:  12/23/2018 FINDINGS: Single view radiograph the pelvis is slightly limited by overlying soft tissue and resultant underpenetration. The pelvis and sacrum are intact. Right total hip arthroplasty has been performed with arthroplasty components overlying the expected position. There is moderate left hip degenerative arthritis, stable since prior examination. No fracture or dislocation noted involving the hips bilaterally. Vascular calcifications are seen within the pelvis and medial thighs bilaterally. IMPRESSION: 1. No acute fracture or dislocation involving the hips bilaterally. 2. Right total hip arthroplasty. 3. Moderate left hip degenerative arthritis. Electronically Signed   By: Fidela Salisbury MD   On: 10/04/2019 16:47   DG Chest Port 1 View  Result Date: 10/05/2019 CLINICAL DATA:  Atrial fibrillation EXAM: PORTABLE CHEST 1 VIEW COMPARISON:  Chest radiograph and chest CT October 04, 2019 FINDINGS: Previously noted pleural effusion on the right is not well delineated by radiography. There is atelectatic change in the bases. No edema or airspace consolidation. Heart is mildly enlarged with pulmonary vascularity within normal limits.  Patient is status post coronary artery bypass grafting and aortic valve replacement. No evident adenopathy. There is aortic atherosclerosis. No bone lesions IMPRESSION: Pleural effusion seen on recent CT not well appreciated by radiography. Bibasilar atelectasis. No edema or airspace opacity. Stable cardiac silhouette. Postoperative changes. Aortic Atherosclerosis (ICD10-I70.0). Electronically Signed   By: Lowella Grip III M.D.   On: 10/05/2019 14:12   DG Chest Port 1 View  Result Date: 10/04/2019 CLINICAL DATA:  Hemoperitoneum in atrial fibrillation EXAM: PORTABLE CHEST 1 VIEW COMPARISON:  Radiograph 08/10/2018, CT 12/09/2018 FINDINGS:  Low lung volumes with some streaky opacities favoring some mild atelectatic change. More diffuse coarse interstitial opacities are present much of which is similar to prior albeit with some fissural thickening and faint septal lines and minimal central vascular congestion which could reflect some superimposed mild interstitial edematous change. Prior sternotomy CABG. Cardiomediastinal contours are stable from prior counting for differences in technique with a calcified aorta. Cervical carotid atherosclerosis is noted as well. Telemetry leads overlie the chest. No acute osseous or soft tissue abnormality. Degenerative changes are present in the imaged spine and shoulders. IMPRESSION: 1. Findings could suggest a mild interstitial edema with basilar atelectatic changes on a background of more chronic interstitial and bronchitic features. 2. Cervical carotid atherosclerosis. 3. Prior sternotomy, CABG. Electronically Signed   By: Lovena Le M.D.   On: 10/04/2019 18:28   DG Ankle Right Port  Result Date: 10/04/2019 CLINICAL DATA:  Right ankle fracture. EXAM: PORTABLE RIGHT ANKLE - 2 VIEW COMPARISON:  X-ray right ankle 10/04/2019 4:18 p.m., CT right ankle 10/04/2019 FINDINGS: Interval cast placement with improved anatomical alignment of a mildly angulated medial malleolar fracture and oblique trans syndesmotic distal fibular fracture. Known posterior malleolus fracture not well visualized on this study. There is persistent but slightly improved widening of the medial clear space. Right ankle effusion with improved but persistent mild posterior subluxation of the tibiotalar articulation. No new acute fracture or dislocation of the bones of the right ankle. Associated subcu soft tissue edema. IMPRESSION: Improved anatomical alignment of a known trimalleolar fracture status post cast placement. Improved but persistent medial space widening and mild tibiotalar posterior subluxation in the setting of an ankle effusion.  Electronically Signed   By: Iven Finn M.D.   On: 10/04/2019 21:31   DG Ankle Right Port  Result Date: 10/04/2019 CLINICAL DATA:  Fall, chronic anticoagulation EXAM: PORTABLE RIGHT ANKLE - 2 VIEW COMPARISON:  None. FINDINGS: The examination is markedly limited by suboptimal positioning, particularly the frontal radiograph. Despite this, there is an oblique fracture of the distal fibular diaphysis noted with at least 1 cortical with posteromedial displacement of the distal fracture fragment. There isq irregularity involving the lateral aspect of the distal tibia, not well profiled on this examination. There is posterior subluxation of the tibiotalar articulation with widening of the joint space anteriorly. Right ankle effusion is present. There is soft tissue swelling surrounding the tibiotalar joint. IMPRESSION: 1. Markedly limited examination. 2. Displaced oblique fracture of the distal fibular diaphysis. 3. Possible fracture involving the lateral aspect of the distal tibia. 4. Posterior subluxation of the tibiotalar articulation. 5. These findings could be better assessed with dedicated three view radiograph of the right ankle or, if limited by patient disability, dedicated CT imaging of the right ankle. Electronically Signed   By: Fidela Salisbury MD   On: 10/04/2019 16:51   Scheduled Meds: . sodium chloride   Intravenous Once  . bethanechol  5 mg Oral TID  .  Chlorhexidine Gluconate Cloth  6 each Topical Daily  . cyclobenzaprine  10 mg Oral TID  . insulin aspart  0-9 Units Subcutaneous Q4H  . lidocaine  1 application Topical Once  . loratadine  10 mg Oral Daily  . metoprolol succinate  25 mg Oral Daily  . pantoprazole  40 mg Oral Daily  . pregabalin  50 mg Oral TID  . rosuvastatin  20 mg Oral Daily  . tamsulosin  0.4 mg Oral Daily   Continuous Infusions:    LOS: 2 days    Time spent: 80min Domenic Polite, MD [facility]  10/06/2019, 11:15 AM

## 2019-10-06 NOTE — Progress Notes (Signed)
Due to OR availability I will be taking over for surgical care and we will allow patient to eat today and plan for OR tomorrow at 0730 as first case.

## 2019-10-06 NOTE — TOC CAGE-AID Note (Signed)
Transition of Care [facility]) - CAGE-AID Screening   Patient Details  Name: Justin [name] MRN: [id number] Date of Birth: [birth year]  Transition of Care [facility]) CM/SW Contact:    Emeterio Reeve, [place] Phone Number: 10/06/2019, 4:38 PM   Clinical Narrative:  CSW met with pt at bedside. CSW introduced self and explained her role at the hospital.  PT denies alcohol use and substance use. Pt did not need any resources at this time.    CAGE-AID Screening:    Have You Ever Felt You Ought to Cut Down on Your Drinking or Drug Use?: No Have People Annoyed You By Critizing Your Drinking Or Drug Use?: No Have You Felt Bad Or Guilty About Your Drinking Or Drug Use?: No Have You Ever Had a Drink or Used Drugs First Thing In The Morning to Steady Your Nerves or to Get Rid of a Hangover?: No CAGE-AID Score: 0  Substance Abuse Education Offered: Yes     Blima Ledger, [place] Social Worker [phone]

## 2019-10-06 NOTE — Progress Notes (Signed)
Pt transferred to room 5N20 via stretcher from 4N.  Pt alert and oriented x4.  No complaints of pain or discomfort at the moment.  Pt knows that he is having surgery at 6pm today and is NPO except sips with meds.

## 2019-10-07 ENCOUNTER — Inpatient Hospital Stay (HOSPITAL_COMMUNITY): Payer: Medicare Other

## 2019-10-07 ENCOUNTER — Inpatient Hospital Stay (HOSPITAL_COMMUNITY): Payer: Medicare Other | Admitting: Anesthesiology

## 2019-10-07 ENCOUNTER — Encounter (HOSPITAL_COMMUNITY): Payer: Self-pay

## 2019-10-07 ENCOUNTER — Encounter (HOSPITAL_COMMUNITY): Admission: EM | Disposition: A | Discharge status: Skilled Nursing Facility | Payer: Self-pay | Source: Home / Self Care

## 2019-10-07 HISTORY — PX: ORIF ANKLE FRACTURE: SHX5408

## 2019-10-07 LAB — CBC
HCT: 32.9 % — ABNORMAL LOW (ref 39.0–52.0)
Hemoglobin: 10.6 g/dL — ABNORMAL LOW (ref 13.0–17.0)
MCH: 33.1 pg (ref 26.0–34.0)
MCHC: 32.2 g/dL (ref 30.0–36.0)
MCV: 102.8 fL — ABNORMAL HIGH (ref 80.0–100.0)
Platelets: 215 10*3/uL (ref 150–400)
RBC: 3.2 MIL/uL — ABNORMAL LOW (ref 4.22–5.81)
RDW: 20.7 % — ABNORMAL HIGH (ref 11.5–15.5)
WBC: 47.6 10*3/uL — ABNORMAL HIGH (ref 4.0–10.5)
nRBC: 2 % — ABNORMAL HIGH (ref 0.0–0.2)

## 2019-10-07 LAB — HEMOGLOBIN AND HEMATOCRIT, BLOOD
HCT: 27.8 % — ABNORMAL LOW (ref 39.0–52.0)
Hemoglobin: 8.9 g/dL — ABNORMAL LOW (ref 13.0–17.0)

## 2019-10-07 LAB — GLUCOSE, CAPILLARY
Glucose-Capillary: 132 mg/dL — ABNORMAL HIGH (ref 70–99)
Glucose-Capillary: 135 mg/dL — ABNORMAL HIGH (ref 70–99)
Glucose-Capillary: 137 mg/dL — ABNORMAL HIGH (ref 70–99)
Glucose-Capillary: 143 mg/dL — ABNORMAL HIGH (ref 70–99)
Glucose-Capillary: 149 mg/dL — ABNORMAL HIGH (ref 70–99)
Glucose-Capillary: 151 mg/dL — ABNORMAL HIGH (ref 70–99)
Glucose-Capillary: 153 mg/dL — ABNORMAL HIGH (ref 70–99)
Glucose-Capillary: 212 mg/dL — ABNORMAL HIGH (ref 70–99)

## 2019-10-07 LAB — COMPREHENSIVE METABOLIC PANEL
ALT: 19 U/L (ref 0–44)
AST: 35 U/L (ref 15–41)
Albumin: 2.7 g/dL — ABNORMAL LOW (ref 3.5–5.0)
Alkaline Phosphatase: 98 U/L (ref 38–126)
Anion gap: 8 (ref 5–15)
BUN: 13 mg/dL (ref 8–23)
CO2: 26 mmol/L (ref 22–32)
Calcium: 8.5 mg/dL — ABNORMAL LOW (ref 8.9–10.3)
Chloride: 97 mmol/L — ABNORMAL LOW (ref 98–111)
Creatinine, Ser: 0.95 mg/dL (ref 0.61–1.24)
GFR calc Af Amer: >60 mL/min (ref >60)
GFR calc non Af Amer: >60 mL/min (ref >60)
Glucose, Bld: 149 mg/dL — ABNORMAL HIGH (ref 70–99)
Potassium: 4.6 mmol/L (ref 3.5–5.1)
Sodium: 131 mmol/L — ABNORMAL LOW (ref 135–145)
Total Bilirubin: 4.2 mg/dL — ABNORMAL HIGH (ref 0.3–1.2)
Total Protein: 6.2 g/dL — ABNORMAL LOW (ref 6.5–8.1)

## 2019-10-07 LAB — PROTIME-INR
INR: 1.4 — ABNORMAL HIGH (ref 0.8–1.2)
Prothrombin Time: 17 s — ABNORMAL HIGH (ref 11.4–15.2)

## 2019-10-07 SURGERY — OPEN REDUCTION INTERNAL FIXATION (ORIF) ANKLE FRACTURE
Anesthesia: General | Site: Ankle | Laterality: Right

## 2019-10-07 MED ORDER — VANCOMYCIN HCL 1000 MG IV SOLR
INTRAVENOUS | Status: DC | PRN
  Administered 2019-10-07: 1000 mg via TOPICAL

## 2019-10-07 MED ORDER — ONDANSETRON HCL 4 MG/2ML IJ SOLN: 4.0000 mg | Freq: Once | INTRAMUSCULAR | Status: DC | PRN

## 2019-10-07 MED ORDER — CEFAZOLIN SODIUM-DEXTROSE 1-4 GM/50ML-% IV SOLN
1.0000 g | Freq: Four times a day (QID) | INTRAVENOUS | Status: AC
  Administered 2019-10-07 – 2019-10-08 (×3): 1 g via INTRAVENOUS
  Filled 2019-10-07 (×3): qty 50

## 2019-10-07 MED ORDER — ONDANSETRON HCL 4 MG PO TABS
4.0000 mg | ORAL_TABLET | Freq: Four times a day (QID) | ORAL | Status: DC | PRN
  Administered 2019-10-11: 4 mg via ORAL
  Filled 2019-10-07: qty 1

## 2019-10-07 MED ORDER — BUPIVACAINE-EPINEPHRINE (PF) 0.5% -1:200000 IJ SOLN: INTRAMUSCULAR | Status: DC | PRN

## 2019-10-07 MED ORDER — DEXAMETHASONE SODIUM PHOSPHATE 4 MG/ML IJ SOLN
INTRAMUSCULAR | Status: DC | PRN
  Administered 2019-10-07: 4 mg via INTRAVENOUS
  Administered 2019-10-07: 6 mg via INTRAVENOUS

## 2019-10-07 MED ORDER — LIDOCAINE 2% (20 MG/ML) 5 ML SYRINGE
INTRAMUSCULAR | Status: DC | PRN
  Administered 2019-10-07: 100 mg via INTRAVENOUS

## 2019-10-07 MED ORDER — HYDROCODONE-ACETAMINOPHEN 5-325 MG PO TABS
1.0000 | ORAL_TABLET | ORAL | Status: DC | PRN
  Administered 2019-10-10 – 2019-10-12 (×2): 1 via ORAL
  Filled 2019-10-07 (×2): qty 1

## 2019-10-07 MED ORDER — ONDANSETRON HCL 4 MG/2ML IJ SOLN
INTRAMUSCULAR | Status: AC
  Filled 2019-10-07: qty 2

## 2019-10-07 MED ORDER — 0.9 % SODIUM CHLORIDE (POUR BTL) OPTIME
TOPICAL | Status: DC | PRN
  Administered 2019-10-07: 1000 mL

## 2019-10-07 MED ORDER — DEXAMETHASONE SODIUM PHOSPHATE 10 MG/ML IJ SOLN
INTRAMUSCULAR | Status: DC | PRN
  Administered 2019-10-07: 5 mg via INTRAVENOUS

## 2019-10-07 MED ORDER — PROPOFOL 10 MG/ML IV BOLUS
INTRAVENOUS | Status: AC
  Filled 2019-10-07: qty 20

## 2019-10-07 MED ORDER — MIDAZOLAM HCL 2 MG/2ML IJ SOLN
INTRAMUSCULAR | Status: AC
  Filled 2019-10-07: qty 2

## 2019-10-07 MED ORDER — FENTANYL CITRATE (PF) 250 MCG/5ML IJ SOLN
INTRAMUSCULAR | Status: DC | PRN
Start: 2019-10-07 — End: 2019-10-07
  Administered 2019-10-07: 50 ug via INTRAVENOUS
  Administered 2019-10-07: 25 ug via INTRAVENOUS
  Administered 2019-10-07 (×2): 50 ug via INTRAVENOUS

## 2019-10-07 MED ORDER — HYDROCODONE-ACETAMINOPHEN 7.5-325 MG PO TABS
1.0000 | ORAL_TABLET | ORAL | Status: DC | PRN
  Administered 2019-10-07 – 2019-10-12 (×14): 1 via ORAL
  Filled 2019-10-07 (×14): qty 1

## 2019-10-07 MED ORDER — DEXAMETHASONE SODIUM PHOSPHATE 10 MG/ML IJ SOLN
INTRAMUSCULAR | Status: AC
  Filled 2019-10-07: qty 1

## 2019-10-07 MED ORDER — PROPOFOL 10 MG/ML IV BOLUS
INTRAVENOUS | Status: DC | PRN
  Administered 2019-10-07: 150 mg via INTRAVENOUS

## 2019-10-07 MED ORDER — CHLORHEXIDINE GLUCONATE 0.12 % MT SOLN
OROMUCOSAL | Status: AC
  Administered 2019-10-07: 15 mL via OROMUCOSAL
  Filled 2019-10-07: qty 15

## 2019-10-07 MED ORDER — PHENYLEPHRINE HCL-NACL 10-0.9 MG/250ML-% IV SOLN
INTRAVENOUS | Status: DC | PRN
  Administered 2019-10-07: 40 ug/min via INTRAVENOUS

## 2019-10-07 MED ORDER — ROCURONIUM BROMIDE 10 MG/ML (PF) SYRINGE
PREFILLED_SYRINGE | INTRAVENOUS | Status: AC
  Filled 2019-10-07: qty 10

## 2019-10-07 MED ORDER — LACTATED RINGERS IV SOLN: INTRAVENOUS | Status: DC | PRN

## 2019-10-07 MED ORDER — CLONIDINE HCL (ANALGESIA) 100 MCG/ML EP SOLN
EPIDURAL | Status: DC | PRN
  Administered 2019-10-07: 65 ug
  Administered 2019-10-07: 35 ug

## 2019-10-07 MED ORDER — VANCOMYCIN HCL 1000 MG IV SOLR
INTRAVENOUS | Status: AC
  Filled 2019-10-07: qty 1000

## 2019-10-07 MED ORDER — BUPIVACAINE-EPINEPHRINE (PF) 0.5% -1:200000 IJ SOLN
INTRAMUSCULAR | Status: DC | PRN
  Administered 2019-10-07: 20 mL via PERINEURAL
  Administered 2019-10-07: 30 mL via PERINEURAL

## 2019-10-07 MED ORDER — ONDANSETRON HCL 4 MG/2ML IJ SOLN: 4.0000 mg | Freq: Four times a day (QID) | INTRAMUSCULAR | Status: DC | PRN

## 2019-10-07 MED ORDER — HYDROMORPHONE HCL 1 MG/ML IJ SOLN: 0.2500 mg | INTRAMUSCULAR | Status: DC | PRN

## 2019-10-07 MED ORDER — FENTANYL CITRATE (PF) 250 MCG/5ML IJ SOLN
INTRAMUSCULAR | Status: AC
  Filled 2019-10-07: qty 5

## 2019-10-07 MED ORDER — DOCUSATE SODIUM 100 MG PO CAPS
100.0000 mg | ORAL_CAPSULE | Freq: Two times a day (BID) | ORAL | Status: DC
  Administered 2019-10-07 (×2): 100 mg via ORAL
  Filled 2019-10-07 (×2): qty 1

## 2019-10-07 MED ORDER — CHLORHEXIDINE GLUCONATE 0.12 % MT SOLN: 15.0000 mL | Freq: Once | OROMUCOSAL | Status: AC

## 2019-10-07 MED ORDER — MIDAZOLAM HCL 5 MG/5ML IJ SOLN
INTRAMUSCULAR | Status: DC | PRN
  Administered 2019-10-07: 2 mg via INTRAVENOUS

## 2019-10-07 MED ORDER — BUPIVACAINE HCL (PF) 0.5 % IJ SOLN
INTRAMUSCULAR | Status: AC
  Filled 2019-10-07: qty 30

## 2019-10-07 MED ORDER — ONDANSETRON HCL 4 MG/2ML IJ SOLN
INTRAMUSCULAR | Status: DC | PRN
  Administered 2019-10-07: 4 mg via INTRAVENOUS

## 2019-10-07 MED ORDER — LIDOCAINE 2% (20 MG/ML) 5 ML SYRINGE
INTRAMUSCULAR | Status: AC
  Filled 2019-10-07: qty 5

## 2019-10-07 MED ORDER — MEPERIDINE HCL 25 MG/ML IJ SOLN: 6.2500 mg | INTRAMUSCULAR | Status: DC | PRN

## 2019-10-07 SURGICAL SUPPLY — 87 items
APL SKNCLS STERI-STRIP NONHPOA (GAUZE/BANDAGES/DRESSINGS)
BENZOIN TINCTURE PRP APPL 2/3 (GAUZE/BANDAGES/DRESSINGS) IMPLANT
BIT DRILL 2 CANN GRADUATED (BIT) ×1 IMPLANT
BIT DRILL 2.5 CANN LNG (BIT) ×1 IMPLANT
BIT DRILL 2.5 CANN STRL (BIT) ×1 IMPLANT
BIT DRILL 2.6 CANN (BIT) ×1 IMPLANT
BIT DRILL 2.7 (BIT) ×2
BIT DRILL 2.7X2.7/3XSCR ANKL (BIT) IMPLANT
BIT DRL 2.7X2.7/3XSCR ANKL (BIT) ×1
BLADE SURG 10 STRL SS (BLADE) ×2 IMPLANT
BLADE SURG 15 STRL LF DISP TIS (BLADE) ×2 IMPLANT
BLADE SURG 15 STRL SS (BLADE) ×4
BNDG CMPR STD VLCR NS LF 5.8X4 (GAUZE/BANDAGES/DRESSINGS) ×1
BNDG COHESIVE 4X5 TAN STRL (GAUZE/BANDAGES/DRESSINGS) ×2 IMPLANT
BNDG ELASTIC 4X5.8 VLCR NS LF (GAUZE/BANDAGES/DRESSINGS) ×1 IMPLANT
BNDG ELASTIC 4X5.8 VLCR STR LF (GAUZE/BANDAGES/DRESSINGS) ×1 IMPLANT
BNDG ELASTIC 6X5.8 VLCR STR LF (GAUZE/BANDAGES/DRESSINGS) ×2 IMPLANT
BRUSH SCRUB EZ  4% CHG (MISCELLANEOUS) ×2
BRUSH SCRUB EZ 4% CHG (MISCELLANEOUS) IMPLANT
CUFF TOURN SGL QUICK 24 (TOURNIQUET CUFF) ×2
CUFF TRNQT CYL 24X4X16.5-23 (TOURNIQUET CUFF) IMPLANT
DRAPE C-ARM 42X72 X-RAY (DRAPES) ×1 IMPLANT
DRAPE C-ARMOR (DRAPES) ×1 IMPLANT
DRAPE INCISE IOBAN 66X45 STRL (DRAPES) ×2 IMPLANT
DRAPE OEC MINIVIEW 54X84 (DRAPES) ×1 IMPLANT
DRAPE U-SHAPE 47X51 STRL (DRAPES) ×2 IMPLANT
DRIVER CANN QF 2.5 (ORTHOPEDIC DISPOSABLE SUPPLIES) ×1 IMPLANT
DRIVER SURG HEX T15 (ORTHOPEDIC DISPOSABLE SUPPLIES) ×1 IMPLANT
DRIVER SURG HEXALOBE T15 (ORTHOPEDIC DISPOSABLE SUPPLIES) ×1 IMPLANT
DRSG PAD ABDOMINAL 8X10 ST (GAUZE/BANDAGES/DRESSINGS) ×3 IMPLANT
DRSG XEROFORM 1X8 (GAUZE/BANDAGES/DRESSINGS) ×1 IMPLANT
DURAPREP 26ML APPLICATOR (WOUND CARE) ×3 IMPLANT
ELECT REM PT RETURN 9FT ADLT (ELECTROSURGICAL) ×2
ELECTRODE REM PT RTRN 9FT ADLT (ELECTROSURGICAL) ×1 IMPLANT
GAUZE SPONGE 4X4 12PLY STRL (GAUZE/BANDAGES/DRESSINGS) ×2 IMPLANT
GLOVE BIO SURGEON STRL SZ 6.5 (GLOVE) ×2 IMPLANT
GLOVE BIO SURGEON STRL SZ8 (GLOVE) ×2 IMPLANT
GLOVE BIOGEL PI IND STRL 8 (GLOVE) ×1 IMPLANT
GLOVE BIOGEL PI INDICATOR 8 (GLOVE) ×2
GLOVE ECLIPSE 8.0 STRL XLNG CF (GLOVE) ×4 IMPLANT
GLOVE INDICATOR 6.5 STRL GRN (GLOVE) ×2 IMPLANT
GOWN STRL REUS W/ TWL LRG LVL3 (GOWN DISPOSABLE) ×1 IMPLANT
GOWN STRL REUS W/ TWL XL LVL3 (GOWN DISPOSABLE) ×1 IMPLANT
GOWN STRL REUS W/TWL LRG LVL3 (GOWN DISPOSABLE) ×4
GOWN STRL REUS W/TWL XL LVL3 (GOWN DISPOSABLE) ×4
GUIDEWIRE 1.35MM (WIRE) ×1 IMPLANT
IMPL TIGHTROP W/DRV K-LESS (Anchor) IMPLANT
IMPLANT TIGHTROPE W/DRV K-LESS (Anchor) ×2 IMPLANT
KIT BASIN OR (CUSTOM PROCEDURE TRAY) ×2 IMPLANT
NEEDLE HYPO 22GX1.5 SAFETY (NEEDLE) IMPLANT
NS IRRIG 1000ML POUR BTL (IV SOLUTION) ×2 IMPLANT
PACK ORTHO EXTREMITY (CUSTOM PROCEDURE TRAY) ×2 IMPLANT
PAD CAST 4YDX4 CTTN HI CHSV (CAST SUPPLIES) ×1 IMPLANT
PADDING CAST COTTON 4X4 STRL (CAST SUPPLIES) ×10
PADDING CAST COTTON 6X4 STRL (CAST SUPPLIES) ×1 IMPLANT
PLATE 5HOLE LOCKING 91MML (Plate) ×1 IMPLANT
PLATE DIST FIB LOCK 8H RIGHT (Plate) ×1 IMPLANT
SCREW CORTEX LP TM SS 2.7X16 (Screw) ×2 IMPLANT
SCREW LOCK T15 FT 16X3.5XST (Screw) IMPLANT
SCREW LOCKING 2.7X12 ANKLE (Screw) ×2 IMPLANT
SCREW LOCKING 2.7X14MM (Screw) ×1 IMPLANT
SCREW LOCKING 2.7X16MM (Screw) ×1 IMPLANT
SCREW LOCKING 3.5X16MM (Screw) ×6 IMPLANT
SCREW LOW PROFILE 3.5X14 (Screw) ×1 IMPLANT
SCREW LOW PROFILE CANN 4.0X45 (Screw) ×1 IMPLANT
SLEEVE SCD COMPRESS KNEE MED (MISCELLANEOUS) IMPLANT
SPLINT PLASTER CAST XFAST 5X30 (CAST SUPPLIES) IMPLANT
SPLINT PLASTER XFAST SET 5X30 (CAST SUPPLIES) ×2
STRIP CLOSURE SKIN 1/2X4 (GAUZE/BANDAGES/DRESSINGS) ×1 IMPLANT
SUCTION FRAZIER HANDLE 10FR (MISCELLANEOUS) ×2
SUCTION TUBE FRAZIER 10FR DISP (MISCELLANEOUS) ×1 IMPLANT
SUT ETHILON 2 0 FS 18 (SUTURE) ×3 IMPLANT
SUT MON AB 3-0 SH 27 (SUTURE)
SUT MON AB 3-0 SH27 (SUTURE) IMPLANT
SUT VIC AB 0 CT1 27 (SUTURE) ×4
SUT VIC AB 0 CT1 27XBRD ANBCTR (SUTURE) ×1 IMPLANT
SUT VIC AB 3-0 CT1 27 (SUTURE) ×2
SUT VIC AB 3-0 CT1 TAPERPNT 27 (SUTURE) IMPLANT
SUT VIC AB 3-0 SH 27 (SUTURE) ×4
SUT VIC AB 3-0 SH 27X BRD (SUTURE) ×1 IMPLANT
SYR CONTROL 10ML LL (SYRINGE) IMPLANT
TOWEL GREEN STERILE FF (TOWEL DISPOSABLE) ×4 IMPLANT
TUBE CONNECTING 20X1/4 (TUBING) ×2 IMPLANT
UNDERPAD 30X36 HEAVY ABSORB (UNDERPADS AND DIAPERS) ×2 IMPLANT
WASHER (Orthopedic Implant) ×2 IMPLANT
WASHER ORTHO 7X (Orthopedic Implant) IMPLANT
YANKAUER SUCT BULB TIP NO VENT (SUCTIONS) ×2 IMPLANT

## 2019-10-07 NOTE — Op Note (Signed)
Orthopaedic Surgery Operative Note (CSN: [id number])  Rieley Hausman Bulthuis  [birth year] Date of Surgery: 10/07/2019   Diagnoses:  Right trimalleolar ankle fracture dislocation  Procedure: Right trimalleolar ankle fracture ORIF without fixation of the posterior lip Right syndesmosis ORIF    Operative Finding Successful completion of the planned procedure.  Patient ankle was very unstable and there was a long oblique interfragmentary piece.  We did lag of the distal fragments together but then we used bridge technique to fix the rest of the fibula..  Good fixation overall.  Syndesmosis was unstable on external rotation stress test and on preoperative imaging.  The medial malleolus was relatively small and were only able to get 1 screw fixation.  Post-operative plan: The patient will be nonweightbearing for 6 weeks with splint transition to cast at first visit.  The patient will be readmitted to the floor.  DVT prophylaxis Lovenox 40 mg/day until mobilizing and then consider transition in clinic to alternative medicines.   Pain control with PRN pain medication preferring oral medicines.  Follow up plan will be scheduled in approximately 7 days for incision check and XR.  Post-Op Diagnosis: Same Surgeons:Primary: Hiram Gash, MD Assistants:Caroline McBane PA-C Location: [place] [facility] Anesthesia: General with regional anesthesia Antibiotics: Ancef 2 g with local vancomycin powder 1 g at the surgical site Tourniquet time:  Total Tourniquet Time Documented: Calf (Right) - 60 minutes Total: Calf (Right) - 60 minutes  Estimated Blood Loss: Minimal Complications: Non Specimens: None Implants: Implant Name Type Inv. Item Serial No. Manufacturer Lot No. LRB No. Used Action  IMPLANT TIGHTROPE W/DRV K-LESS - [id number] Anchor IMPLANT TIGHTROPE W/DRV [employer] [id number] Right 1 Implanted  PLATE DIST FIB LOCK 8H RIGHT - [id number] Plate PLATE DIST FIB LOCK 8H RIGHT  [employer]  Right 1  Implanted  SCREW LOCKING 2.7X14MM - [id number] Screw SCREW LOCKING 2.7X14MM  [employer]  Right 1 Implanted  SCREW LOCKING 2.7X16MM - [id number] Screw SCREW LOCKING 2.7X16MM  [employer]  Right 1 Implanted  SCREW LOCKING 2.7X12 ANKLE - [id number] Screw SCREW LOCKING 2.7X12 ANKLE  [employer]  Right 2 Implanted  SCREW CORTEX LP TM SS 2.7X16 - [id number] Screw SCREW CORTEX LP TM SS 2.7X16  [employer]  Right 2 Implanted  SCREW LOCKING 3.5X16MM - [id number] Screw SCREW LOCKING 3.5X16MM  [employer]  Right 3 Implanted  SCREW LOW PROFILE 3.5X14 - [id number] Screw SCREW LOW PROFILE 3.5X14  [employer]  Right 1 Implanted  4.0 X 45MM SCREW    [employer]  Right 1 Implanted  WASHER - [id number] Orthopedic Implant WASHER  [employer]  Right 1 Implanted    Indications for Surgery:   Yuvraj Pfeifer is a 64 y.o. male with fall resulting in a trimalleolar ankle fracture dislocation and I was asked to assist you to surgical availability.  Benefits and risks of operative and nonoperative management were discussed prior to surgery with patient/guardian(s) and informed consent form was completed.  Specific risks including infection, need for additional surgery, nonunion, malunion, loss of fixation, postoperative arthrosis amongst others   Procedure:   The patient was identified properly. Informed consent was obtained and the surgical site was marked. The patient was taken up to suite where general anesthesia was induced.  The patient was positioned supine on a regular bed.  The right ankle was prepped and draped in the usual sterile fashion.  Timeout was performed before the beginning of the case.  Tourniquet was used for the  above duration.  We began with our ORIF of the fibula. A longitudinal approach was made along the lateral border of the fibula centered at the fracture site. We dissected down taking care to avoid the superficial peroneal nerve which crossed proximal to our incision. We encountered the fracture  site and noted a long oblique fracture with comminution. The bone quality was poor.  We are able to place 1 distal lag but the rest of this fracture was not amenable to lag screw fixation we used bridge technique. We then filled  multiple locking holes distal to the fracture site and 3 holes proximal achieving bicortical fixation with all screws.  We used combination of locking and nonlocking proximal to the fragment.  We confirmed anatomic reduction of fluoroscopy and then turned our attention to the medial side.  An oblique incision was made over the anterior aspect of the medial malleolus were able to identify the fracture site itself. A point the fracture site was cleared of interposed periosteum and we were able to obtain an anatomic reduction was held with point-to-point clamp. At this point we placed 1 partially threaded 45 mm cannulated screws with washers across the fracture site achieving good purchase and good compression with each screw.   We performed a external rotation stress test and noted the syndesmosis was widened.  We then felt that a tight rope would be appropriate.  We used the Arthrex tight rope kit and placed a Quadra cortical drill hole across the fibula and then tibia taking care not to damage the skin medially.  We then passed our tight rope device and cinched to check its position on fluoroscopy.  We were able to get good compression of the syndesmosis anatomic reduction.  We irrigated the wound copiously before placing local antibiotic as listed above.  We closed the incision in a multilayer fashion with nonabsorbable suture.  Sterile dressing was placed.  Patient was awoken taken to PACU in stable condition.  Noemi Chapel, PA-C, present and scrubbed throughout the case, critical for completion in a timely fashion, and for retraction, instrumentation, closure.

## 2019-10-07 NOTE — H&P (View-Only) (Signed)
ORTHOPAEDIC CONSULTATION  REQUESTING PHYSICIAN: Md, Trauma, MD  Chief Complaint: Right ankle fracture  HPI: Justin [name] is a 64 y.o. male with right ankle fracture with a hematoma on anticoagulants with multiple other medical issues.  He had delayed surgery secondary to these issues however he is ready for surgery today.  Patient denies any other extremity pain other than his ankle.  Past Medical History:  Diagnosis Date  . Acute meniscal tear of knee LEFT  . Anemia   . Aortic stenosis 12/01/2017   NONRHEUMATIC, AORTIC VALVE CALCIFICATIONS, MILD TO MODERATE REGURG, MILD TO MODERATE CALCIFIED ANNULUS per ECHO 10/25/17 @ [facility]  . Arthritis   . Atrial fibrillation ([place]) 11/12/2017   AT O/V WITH PCP  . Cancer ([place])    skin right arm  . Coronary artery disease   . DM (diabetes mellitus) ([place])   . Heart murmur MILD-- ASYMPTOMATIC  . History of kidney stones   . Hyperlipidemia   . Hypertension   . Left knee pain   . PAD (peripheral artery disease) ([facility])    left leg claudication   Past Surgical History:  Procedure Laterality Date  . AORTIC VALVE REPLACEMENT N/A 01/03/2018   Procedure: AORTIC VALVE REPLACEMENT (AVR) using 6m Magna Ease Bioprosthesis Aortic Valve;  Surgeon: VIvin Poot MD;  [facility]  Service: Open Heart Surgery;  Laterality: N/A;  . APPENDECTOMY  1998  . BIOPSY  08/11/2018   Procedure: BIOPSY;  Surgeon: NMauri Pole MD;  [facility];  Service: Endoscopy;;  . CATARACT EXTRACTION W/ INTRAOCULAR LENS  IMPLANT, BILATERAL  1998/  2000  . CERVICAL FUSION  1985   C4 - 5  . CORONARY ARTERY BYPASS GRAFT N/A 01/03/2018   Procedure: CORONARY ARTERY BYPASS GRAFTING (CABG) x 4 (LIMA to LAD, SVG to DIAGONAL, SVG to RAMUS INTERMEDIATE, and SVG to PDA), USING LEFT INFTERNAL MAMMARY ARTERY AND;  Surgeon: VPrescott Gum PCollier Salina MD;  [facility]  Service: Open Heart Surgery;  Laterality: N/A;  . ENDARTERECTOMY Right  01/03/2018   Procedure: ENDARTERECTOMY CAROTID;  Surgeon: CMarty Heck MD;  [facility];  Service: Vascular;  Laterality: Right;  . ESOPHAGOGASTRODUODENOSCOPY (EGD) WITH PROPOFOL N/A 08/11/2018   Procedure: ESOPHAGOGASTRODUODENOSCOPY (EGD) WITH PROPOFOL;  Surgeon: NMauri Pole MD;  [facility]: [facility]  Service: Endoscopy;  Laterality: N/A;  . KNEE ARTHROSCOPY W/ MENISCECTOMY  1991  1987   LEFT KNEE   x 2  . NASAL SINUS SURGERY  1982  . RIGHT/LEFT HEART CATH AND CORONARY ANGIOGRAPHY N/A 12/24/2017   Procedure: RIGHT/LEFT HEART CATH AND CORONARY ANGIOGRAPHY;  Surgeon: BJolaine Artist MD;  [facility];  Service: Cardiovascular;  Laterality: N/A;  . ROTATOR CUFF REPAIR  09-04-2005   LEFT SHOULDER  . TEE WITHOUT CARDIOVERSION N/A 12/24/2017   Procedure: TRANSESOPHAGEAL ECHOCARDIOGRAM (TEE);  Surgeon: BJolaine Artist MD;  [facility];  Service: Cardiovascular;  Laterality: N/A;  . TEE WITHOUT CARDIOVERSION N/A 01/03/2018   Procedure: TRANSESOPHAGEAL ECHOCARDIOGRAM (TEE);  Surgeon: VPrescott Gum PCollier Salina MD;  [facility]  Service: Open Heart Surgery;  Laterality: N/A;  . TONSILLECTOMY  AGE 55  . TOTAL HIP ARTHROPLASTY Right 12/23/2018   Procedure: TOTAL HIP ARTHROPLASTY ANTERIOR APPROACH;  Surgeon: OParalee Cancel MD;  [facility];  Service: Orthopedics;  Laterality: Right;  70 mins   Social History   Socioeconomic History  . Marital status: Married    Spouse name: Not on file  .  Number of children: Not on file  . Years of education: Not on file  . Highest education level: Not on file  Occupational History  . Not on file  Tobacco Use  . Smoking status: Former Smoker    Packs/day: 0.25    Years: 23.00    Pack years: 5.75    Types: Cigars    Quit date: 01/02/2018    Years since quitting: 1.7  . Smokeless tobacco: Never Used  . Tobacco comment: quit cigs in 2011 and smokes cigars daily- from 3-10 cigars-09/27/13  Vaping Use  .  Vaping Use: Never used  Substance and Sexual Activity  . Alcohol use: Not Currently    Alcohol/week: 0.0 standard drinks    Comment: occasional  . Drug use: No  . Sexual activity: Not Currently  Other Topics Concern  . Not on file  Social History Narrative  . Not on file   Social Determinants of Health   Financial Resource Strain:   . Difficulty of Paying Living Expenses: Not on file  Food Insecurity:   . Worried About Charity fundraiser in the Last Year: Not on file  . Ran Out of Food in the Last Year: Not on file  Transportation Needs:   . Lack of Transportation (Medical): Not on file  . Lack of Transportation (Non-Medical): Not on file  Physical Activity:   . Days of Exercise per Week: Not on file  . Minutes of Exercise per Session: Not on file  Stress:   . Feeling of Stress : Not on file  Social Connections:   . Frequency of Communication with Friends and Family: Not on file  . Frequency of Social Gatherings with Friends and Family: Not on file  . Attends Religious Services: Not on file  . Active Member of Clubs or Organizations: Not on file  . Attends Archivist Meetings: Not on file  . Marital Status: Not on file   Family History  Adopted: Yes  Problem Relation Age of Onset  . Bladder Cancer Father        adopted father   Allergies  Allergen Reactions  . Codeine Swelling    Tolerates hydrocodone, tramadol, and oxycodone    Prior to Admission medications   Medication Sig Start Date End Date Taking? Authorizing Provider  apixaban (ELIQUIS) 5 MG TABS tablet Take 1 tablet (5 mg total) by mouth 2 (two) times daily. 08/14/18  Yes Irene Pap N, DO  aspirin 81 MG tablet Take 81 mg by mouth daily.    Yes [provider]  Carboxymethylcellulose Sodium (REFRESH TEARS OP) Place 1-2 drops into both eyes daily as needed (dry eyes).   Yes [provider]  Cholecalciferol 125 MCG (5000 UT) capsule Take 5,000 Units by mouth daily.    Yes [provider]  cyclobenzaprine (FLEXERIL) 10 MG tablet Take 10 mg by mouth 3 (three) times daily.    Yes [provider]  ferrous sulfate 325 (65 FE) MG tablet Take 1 tablet (325 mg total) by mouth 2 (two) times daily with a meal. 08/13/18  Yes Hall, Carole N, DO  furosemide (LASIX) 20 MG tablet Take 20 mg by mouth daily as needed for fluid.  04/17/18  Yes [provider]  HYDROcodone-acetaminophen (NORCO) 7.5-325 MG tablet Take 1-2 tablets by mouth every 4 (four) hours as needed for moderate pain.  03/28/19  Yes [provider]  hydroxyurea (HYDREA) 500 MG capsule TAKE 1 CAPSULE BY MOUTH 2 TIMES DAILY.  MAY TAKE WITH FOOD TO MINIMIZE GI SIDE EFFECTS. Patient taking differently: Take 500 mg by mouth 2 (two) times daily.  12/26/18  Yes Tish Men, MD  Lidocaine (BLUE-EMU PAIN RELIEF DRY EX) Apply 1 application topically daily as needed (pain).   Yes [provider]  loratadine (CLARITIN) 10 MG tablet Take 10 mg by mouth daily.   Yes [provider]  metFORMIN (GLUCOPHAGE-XR) 500 MG 24 hr tablet Take 500 mg by mouth daily.    Yes [provider]  metoprolol succinate (TOPROL-XL) 25 MG 24 hr tablet TAKE 1 TABLET BY MOUTH EVERY DAY Patient taking differently: Take 25 mg by mouth daily.  01/27/19  Yes Buford Dresser, MD  Multiple Vitamins-Minerals (CENTRUM ADULTS) TABS Take 1 capsule by mouth daily.   Yes [provider]  pantoprazole (PROTONIX) 40 MG tablet TAKE 1 TABLET BY MOUTH EVERY DAY Patient taking differently: Take 40 mg by mouth daily.  08/14/19  Yes Cirigliano, Vito V, DO  potassium chloride (MICRO-K) 10 MEQ CR capsule Take 10 mEq by mouth daily as needed (take when taking lasix).   Yes [provider]  pregabalin (LYRICA) 50 MG capsule Take 50 mg by mouth 3 (three) times daily.   Yes [provider]  rosuvastatin (CRESTOR) 20 MG tablet Take 1 tablet (20 mg total) by mouth every evening. Patient taking differently:  Take 20 mg by mouth daily.  01/27/19  Yes Buford Dresser, MD  zolpidem (AMBIEN) 10 MG tablet Take 10 mg by mouth at bedtime.    Yes [provider]  amoxicillin (AMOXIL) 500 MG capsule Take 1 capsule (500 mg total) by mouth once as needed for up to 4 doses. Take four 500 mg capsules by mouth 1 hour before dental visits Patient not taking: Reported on 07/11/2019 07/01/18   Ivin Poot, MD   DG MINI C-ARM IMAGE ONLY  Result Date: 10/07/2019 There is no interpretation for this exam.  This order is for images obtained during a surgical procedure.  Please See "Surgeries" Tab for more information regarding the procedure.   Family History Reviewed and non-contributory, no pertinent history of problems with bleeding or anesthesia      Review of Systems 14 system ROS conducted and negative except for that noted in HPI   OBJECTIVE  Vitals: Patient Vitals for the past 8 hrs:  BP Temp Temp src Pulse SpO2  10/07/19 0321 131/66 97.9 F (36.6 C) Oral 89 98 %   General: Alert, no acute distress Cardiovascular: Warm extremities noted Respiratory: No cyanosis, no use of accessory musculature GI: No organomegaly, abdomen is soft and non-tender Skin: No lesions in the area of chief complaint other than those listed below in MSK exam.  Neurologic: Sensation intact distally save for the below mentioned MSK exam Psychiatric: Patient is competent for consent with normal mood and affect Lymphatic: No swelling obvious and reported other than the area involved in the exam below Extremities  Right lower extremity: Well fitting splint in place wiggling toes, limited sensory and motor exam secondary to splint.  Unable to check skin secondary splint.  Test Results Imaging Imaging reviewed and demonstrates a trimalleolar ankle fracture with posterior subluxation and likely syndesmosis injury.   He has a small medial mall fragment and a 15% posterior mall fragment on CT. Labs cbc Recent  Labs    10/06/19 0543 10/06/19 1143 10/06/19 1835 10/07/19 0051  WBC 49.8*  --   --  47.6*  HGB 8.6*   < >  8.9* 10.6*  HCT 26.5*   < > 26.6* 32.9*  PLT 238  --   --  215   < > = values in this interval not displayed.    Labs inflam No results for input(s): CRP in the last 72 hours.  Invalid input(s): ESR  Labs coag Recent Labs    10/06/19 0543 10/07/19 0051  INR 1.3* 1.4*    Recent Labs    10/06/19 0543 10/07/19 0051  NA 129* 131*  K 4.7 4.6  CL 97* 97*  CO2 23 26  GLUCOSE 135* 149*  BUN 14 13  CREATININE 0.81 0.95  CALCIUM 8.2* 8.5*     ASSESSMENT AND PLAN: 64 y.o. male with the following: Trimalleolar ankle fracture   Patient require surgical intervention as he is unstable fracture.  Discussed risk benefits alternatives including but not limited to infection, hardware prominence or pain, postoperative arthrosis and stiffness amongst others.  Patient elected proceed with surgery.  We will plan for an ORIF today and splinting afterwards.  He will likely be nonweightbearing for 6 weeks and casted.  He will be appropriate for discharge from orthopedic perspective after surgery at the discretion of his primary team.  Follow-up with me will be in 1 week.

## 2019-10-07 NOTE — Progress Notes (Signed)
PT Cancellation Note  Patient Details Name: Justin [name] MRN: [id number] DOB: 25-Jan-1955   Cancelled Treatment:    Reason Eval/Treat Not Completed: Patient at procedure or test/unavailable Per chart review, patient currently in OR receiving ankle ORIF. Will follow acutely and eval when medically ready after procedure.   Windell Norfolk, DPT, PN1   Supplemental Physical Therapist [facility]    Pager [phone] Acute Rehab Office [phone]

## 2019-10-07 NOTE — Anesthesia Postprocedure Evaluation (Signed)
Anesthesia Post Note  Patient: Justin [name]  Procedure(s) Performed: OPEN REDUCTION INTERNAL FIXATION (ORIF) ANKLE FRACTURE (Right Ankle)     Patient location during evaluation: PACU Anesthesia Type: General Level of consciousness: sedated and patient cooperative Pain management: pain level controlled Vital Signs Assessment: post-procedure vital signs reviewed and stable Respiratory status: spontaneous breathing Cardiovascular status: stable Anesthetic complications: no   No complications documented.  Last Vitals:  Vitals:   10/07/19 1212 10/07/19 1627  BP: 97/73 (!) 133/52  Pulse: 68 77  Resp: 18 20  Temp: 36.7 C (!) 36.3 C  SpO2: 96% 98%    Last Pain:  Vitals:   10/07/19 1627  TempSrc: Oral  PainSc:                  Nolon Nations

## 2019-10-07 NOTE — Interval H&P Note (Signed)
History and Physical Interval Note:  10/07/2019 7:21 AM  Justin [name]  has presented today for surgery, with the diagnosis of Right trimalleolar ankle fracture.  The various methods of treatment have been discussed with the patient and family. After consideration of risks, benefits and other options for treatment, the patient has consented to  Procedure(s): OPEN REDUCTION INTERNAL FIXATION (ORIF) ANKLE FRACTURE (Right) as a surgical intervention.  The patient's history has been reviewed, patient examined, no change in status, stable for surgery.  I have reviewed the patient's chart and labs.  Questions were answered to the patient's satisfaction.     Hiram Gash

## 2019-10-07 NOTE — Transfer of Care (Signed)
Immediate Anesthesia Transfer of Care Note  Patient: Justin [name]  Procedure(s) Performed: OPEN REDUCTION INTERNAL FIXATION (ORIF) ANKLE FRACTURE (Right Ankle)  Patient Location: PACU  Anesthesia Type:GA combined with regional for post-op pain  Level of Consciousness: awake, alert  and oriented  Airway & Oxygen Therapy: Patient Spontanous Breathing and Patient connected to nasal cannula oxygen  Post-op Assessment: Report given to RN and Post -op Vital signs reviewed and stable  Post vital signs: Reviewed and stable  Last Vitals:  Vitals Value Taken Time  BP 123/74 10/07/19 0933  Temp    Pulse 107 10/07/19 0937  Resp 20 10/07/19 0937  SpO2 93 % 10/07/19 0937  Vitals shown include unvalidated device data.  Last Pain:  Vitals:   10/07/19 0321  TempSrc: Oral  PainSc:          Complications: No complications documented.

## 2019-10-07 NOTE — Anesthesia Procedure Notes (Signed)
Anesthesia Regional Block: Adductor canal block   Pre-Anesthetic Checklist: ,, timeout performed, Correct Patient, Correct Site, Correct Laterality, Correct Procedure, Correct Position, site marked, Risks and benefits discussed,  Surgical consent,  Pre-op evaluation,  At surgeon's request and post-op pain management  Laterality: Right  Prep: chloraprep       Needles:  Injection technique: Single-shot  Needle Type: Stimiplex     Needle Length: 9cm  Needle Gauge: 21     Additional Needles:   Procedures:,,,, ultrasound used (permanent image in chart),,,,  Narrative:  Start time: 10/07/2019 7:29 AM End time: 10/07/2019 7:34 AM Injection made incrementally with aspirations every 5 mL.  Performed by: Personally  Anesthesiologist: Nolon Nations, MD  Additional Notes: BP cuff, EKG monitors applied. Sedation begun. Artery and nerve location verified with U/S and anesthetic injected incrementally, slowly, and after negative aspirations under direct u/s guidance. Good fascial /perineural spread. Tolerated well.

## 2019-10-07 NOTE — Plan of Care (Signed)
  Problem: Activity: Goal: Ability to avoid complications of mobility impairment will improve Outcome: Progressing Goal: Ability to tolerate increased activity will improve Outcome: Progressing   Problem: Education: Goal: Verbalization of understanding the information provided will improve Outcome: Progressing   Problem: Physical Regulation: Goal: Postoperative complications will be avoided or minimized Outcome: Progressing   Problem: Respiratory: Goal: Ability to maintain a clear airway will improve Outcome: Progressing   Problem: Respiratory: Goal: Ability to maintain a clear airway will improve Outcome: Progressing   Problem: Pain Management: Goal: Pain level will decrease Outcome: Progressing   Problem: Skin Integrity: Goal: Signs of wound healing will improve Outcome: Progressing   Problem: Tissue Perfusion: Goal: Ability to maintain adequate tissue perfusion will improve Outcome: Progressing
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
## 2019-10-07 NOTE — Anesthesia Procedure Notes (Signed)
Anesthesia Regional Block: Popliteal block   Pre-Anesthetic Checklist: ,, timeout performed, Correct Patient, Correct Site, Correct Laterality, Correct Procedure, Correct Position, site marked, Risks and benefits discussed,  Surgical consent,  Pre-op evaluation,  At surgeon's request and post-op pain management  Laterality: Right  Prep: chloraprep       Needles:  Injection technique: Single-shot  Needle Type: Stimiplex     Needle Length: 10cm  Needle Gauge: 21     Additional Needles:   Procedures:,,,, ultrasound used (permanent image in chart),,,,  Motor weakness within 5 minutes.   Nerve Stimulator or Paresthesia:  Response: 0.5 mA,   Additional Responses:   Narrative:  Start time: 10/07/2019 7:34 AM End time: 10/07/2019 7:39 AM Injection made incrementally with aspirations every 5 mL.  Performed by: Personally  Anesthesiologist: Nolon Nations, MD  Additional Notes: Nerve located and needle positioned with direct ultrasound guidance. Good perineural spread. Patient tolerated well.

## 2019-10-07 NOTE — Anesthesia Procedure Notes (Signed)
Procedure Name: LMA Insertion Date/Time: 10/07/2019 7:56 AM Performed by: Kyung Rudd, CRNA Pre-anesthesia Checklist: Patient identified, Emergency Drugs available, Suction available and Patient being monitored Patient Re-evaluated:Patient Re-evaluated prior to induction Oxygen Delivery Method: Circle system utilized Preoxygenation: Pre-oxygenation with 100% oxygen Induction Type: IV induction LMA: LMA inserted LMA Size: 4.0 Number of attempts: 1 Placement Confirmation: positive ETCO2 and breath sounds checked- equal and bilateral Tube secured with: Tape Dental Injury: Teeth and Oropharynx as per pre-operative assessment

## 2019-10-07 NOTE — Consult Note (Signed)
ORTHOPAEDIC CONSULTATION  REQUESTING PHYSICIAN: Md, Trauma, MD  Chief Complaint: Right ankle fracture  HPI: Justin [name] is a 64 y.o. male with right ankle fracture with a hematoma on anticoagulants with multiple other medical issues.  He had delayed surgery secondary to these issues however he is ready for surgery today.  Patient denies any other extremity pain other than his ankle.  Past Medical History:  Diagnosis Date  . Acute meniscal tear of knee LEFT  . Anemia   . Aortic stenosis 12/01/2017   NONRHEUMATIC, AORTIC VALVE CALCIFICATIONS, MILD TO MODERATE REGURG, MILD TO MODERATE CALCIFIED ANNULUS per ECHO 10/25/17 @ [facility]  . Arthritis   . Atrial fibrillation ([place]) 11/12/2017   AT O/V WITH PCP  . Cancer ([place])    skin right arm  . Coronary artery disease   . DM (diabetes mellitus) ([facility])   . Heart murmur MILD-- ASYMPTOMATIC  . History of kidney stones   . Hyperlipidemia   . Hypertension   . Left knee pain   . PAD (peripheral artery disease) ([facility])    left leg claudication   Past Surgical History:  Procedure Laterality Date  . AORTIC VALVE REPLACEMENT N/A 01/03/2018   Procedure: AORTIC VALVE REPLACEMENT (AVR) using 6m Magna Ease Bioprosthesis Aortic Valve;  Surgeon: VIvin Poot MD;  [facility]  Service: Open Heart Surgery;  Laterality: N/A;  . APPENDECTOMY  1998  . BIOPSY  08/11/2018   Procedure: BIOPSY;  Surgeon: NMauri Pole MD;  [facility];  Service: Endoscopy;;  . CATARACT EXTRACTION W/ INTRAOCULAR LENS  IMPLANT, BILATERAL  1998/  2000  . CERVICAL FUSION  1985   C4 - 5  . CORONARY ARTERY BYPASS GRAFT N/A 01/03/2018   Procedure: CORONARY ARTERY BYPASS GRAFTING (CABG) x 4 (LIMA to LAD, SVG to DIAGONAL, SVG to RAMUS INTERMEDIATE, and SVG to PDA), USING LEFT INFTERNAL MAMMARY ARTERY AND;  Surgeon: VPrescott Gum PCollier Salina MD;  [facility]  Service: Open Heart Surgery;  Laterality: N/A;  . ENDARTERECTOMY Right  01/03/2018   Procedure: ENDARTERECTOMY CAROTID;  Surgeon: CMarty Heck MD;  [facility];  Service: Vascular;  Laterality: Right;  . ESOPHAGOGASTRODUODENOSCOPY (EGD) WITH PROPOFOL N/A 08/11/2018   Procedure: ESOPHAGOGASTRODUODENOSCOPY (EGD) WITH PROPOFOL;  Surgeon: NMauri Pole MD;  [facility]  Service: Endoscopy;  Laterality: N/A;  . KNEE ARTHROSCOPY W/ MENISCECTOMY  1991  1987   LEFT KNEE   x 2  . NASAL SINUS SURGERY  1982  . RIGHT/LEFT HEART CATH AND CORONARY ANGIOGRAPHY N/A 12/24/2017   Procedure: RIGHT/LEFT HEART CATH AND CORONARY ANGIOGRAPHY;  Surgeon: BJolaine Artist MD;  [facility];  Service: Cardiovascular;  Laterality: N/A;  . ROTATOR CUFF REPAIR  09-04-2005   LEFT SHOULDER  . TEE WITHOUT CARDIOVERSION N/A 12/24/2017   Procedure: TRANSESOPHAGEAL ECHOCARDIOGRAM (TEE);  Surgeon: BJolaine Artist MD;  [facility];  Service: Cardiovascular;  Laterality: N/A;  . TEE WITHOUT CARDIOVERSION N/A 01/03/2018   Procedure: TRANSESOPHAGEAL ECHOCARDIOGRAM (TEE);  Surgeon: VPrescott Gum PCollier Salina MD;  [facility]  Service: Open Heart Surgery;  Laterality: N/A;  . TONSILLECTOMY  AGE 55  . TOTAL HIP ARTHROPLASTY Right 12/23/2018   Procedure: TOTAL HIP ARTHROPLASTY ANTERIOR APPROACH;  Surgeon: OParalee Cancel MD;  [facility];  Service: Orthopedics;  Laterality: Right;  70 mins   Social History   Socioeconomic History  . Marital status: Married    Spouse name: Not on file  .  Number of children: Not on file  . Years of education: Not on file  . Highest education level: Not on file  Occupational History  . Not on file  Tobacco Use  . Smoking status: Former Smoker    Packs/day: 0.25    Years: 23.00    Pack years: 5.75    Types: Cigars    Quit date: 01/02/2018    Years since quitting: 1.7  . Smokeless tobacco: Never Used  . Tobacco comment: quit cigs in 2011 and smokes cigars daily- from 3-10 cigars-09/27/13  Vaping Use  .  Vaping Use: Never used  Substance and Sexual Activity  . Alcohol use: Not Currently    Alcohol/week: 0.0 standard drinks    Comment: occasional  . Drug use: No  . Sexual activity: Not Currently  Other Topics Concern  . Not on file  Social History Narrative  . Not on file   Social Determinants of Health   Financial Resource Strain:   . Difficulty of Paying Living Expenses: Not on file  Food Insecurity:   . Worried About Charity fundraiser in the Last Year: Not on file  . Ran Out of Food in the Last Year: Not on file  Transportation Needs:   . Lack of Transportation (Medical): Not on file  . Lack of Transportation (Non-Medical): Not on file  Physical Activity:   . Days of Exercise per Week: Not on file  . Minutes of Exercise per Session: Not on file  Stress:   . Feeling of Stress : Not on file  Social Connections:   . Frequency of Communication with Friends and Family: Not on file  . Frequency of Social Gatherings with Friends and Family: Not on file  . Attends Religious Services: Not on file  . Active Member of Clubs or Organizations: Not on file  . Attends Archivist Meetings: Not on file  . Marital Status: Not on file   Family History  Adopted: Yes  Problem Relation Age of Onset  . Bladder Cancer Father        adopted father   Allergies  Allergen Reactions  . Codeine Swelling    Tolerates hydrocodone, tramadol, and oxycodone    Prior to Admission medications   Medication Sig Start Date End Date Taking? Authorizing Provider  apixaban (ELIQUIS) 5 MG TABS tablet Take 1 tablet (5 mg total) by mouth 2 (two) times daily. 08/14/18  Yes Irene Pap N, DO  aspirin 81 MG tablet Take 81 mg by mouth daily.    Yes [provider]  Carboxymethylcellulose Sodium (REFRESH TEARS OP) Place 1-2 drops into both eyes daily as needed (dry eyes).   Yes [provider]  Cholecalciferol 125 MCG (5000 UT) capsule Take 5,000 Units by mouth daily.    Yes [provider]  cyclobenzaprine (FLEXERIL) 10 MG tablet Take 10 mg by mouth 3 (three) times daily.    Yes [provider]  ferrous sulfate 325 (65 FE) MG tablet Take 1 tablet (325 mg total) by mouth 2 (two) times daily with a meal. 08/13/18  Yes Hall, Carole N, DO  furosemide (LASIX) 20 MG tablet Take 20 mg by mouth daily as needed for fluid.  04/17/18  Yes [provider]  HYDROcodone-acetaminophen (NORCO) 7.5-325 MG tablet Take 1-2 tablets by mouth every 4 (four) hours as needed for moderate pain.  03/28/19  Yes [provider]  hydroxyurea (HYDREA) 500 MG capsule TAKE 1 CAPSULE BY MOUTH 2 TIMES DAILY.  MAY TAKE WITH FOOD TO MINIMIZE GI SIDE EFFECTS. Patient taking differently: Take 500 mg by mouth 2 (two) times daily.  12/26/18  Yes Tish Men, MD  Lidocaine (BLUE-EMU PAIN RELIEF DRY EX) Apply 1 application topically daily as needed (pain).   Yes [provider]  loratadine (CLARITIN) 10 MG tablet Take 10 mg by mouth daily.   Yes [provider]  metFORMIN (GLUCOPHAGE-XR) 500 MG 24 hr tablet Take 500 mg by mouth daily.    Yes [provider]  metoprolol succinate (TOPROL-XL) 25 MG 24 hr tablet TAKE 1 TABLET BY MOUTH EVERY DAY Patient taking differently: Take 25 mg by mouth daily.  01/27/19  Yes Buford Dresser, MD  Multiple Vitamins-Minerals (CENTRUM ADULTS) TABS Take 1 capsule by mouth daily.   Yes [provider]  pantoprazole (PROTONIX) 40 MG tablet TAKE 1 TABLET BY MOUTH EVERY DAY Patient taking differently: Take 40 mg by mouth daily.  08/14/19  Yes Cirigliano, Vito V, DO  potassium chloride (MICRO-K) 10 MEQ CR capsule Take 10 mEq by mouth daily as needed (take when taking lasix).   Yes [provider]  pregabalin (LYRICA) 50 MG capsule Take 50 mg by mouth 3 (three) times daily.   Yes [provider]  rosuvastatin (CRESTOR) 20 MG tablet Take 1 tablet (20 mg total) by mouth every evening. Patient taking differently:  Take 20 mg by mouth daily.  01/27/19  Yes Buford Dresser, MD  zolpidem (AMBIEN) 10 MG tablet Take 10 mg by mouth at bedtime.    Yes [provider]  amoxicillin (AMOXIL) 500 MG capsule Take 1 capsule (500 mg total) by mouth once as needed for up to 4 doses. Take four 500 mg capsules by mouth 1 hour before dental visits Patient not taking: Reported on 07/11/2019 07/01/18   Ivin Poot, MD   DG MINI C-ARM IMAGE ONLY  Result Date: 10/07/2019 There is no interpretation for this exam.  This order is for images obtained during a surgical procedure.  Please See "Surgeries" Tab for more information regarding the procedure.   Family History Reviewed and non-contributory, no pertinent history of problems with bleeding or anesthesia      Review of Systems 14 system ROS conducted and negative except for that noted in HPI   OBJECTIVE  Vitals: Patient Vitals for the past 8 hrs:  BP Temp Temp src Pulse SpO2  10/07/19 0321 131/66 97.9 F (36.6 C) Oral 89 98 %   General: Alert, no acute distress Cardiovascular: Warm extremities noted Respiratory: No cyanosis, no use of accessory musculature GI: No organomegaly, abdomen is soft and non-tender Skin: No lesions in the area of chief complaint other than those listed below in MSK exam.  Neurologic: Sensation intact distally save for the below mentioned MSK exam Psychiatric: Patient is competent for consent with normal mood and affect Lymphatic: No swelling obvious and reported other than the area involved in the exam below Extremities  Right lower extremity: Well fitting splint in place wiggling toes, limited sensory and motor exam secondary to splint.  Unable to check skin secondary splint.  Test Results Imaging Imaging reviewed and demonstrates a trimalleolar ankle fracture with posterior subluxation and likely syndesmosis injury.   He has a small medial mall fragment and a 15% posterior mall fragment on CT. Labs cbc Recent  Labs    10/06/19 0543 10/06/19 1143 10/06/19 1835 10/07/19 0051  WBC 49.8*  --   --  47.6*  HGB 8.6*   < >  8.9* 10.6*  HCT 26.5*   < > 26.6* 32.9*  PLT 238  --   --  215   < > = values in this interval not displayed.    Labs inflam No results for input(s): CRP in the last 72 hours.  Invalid input(s): ESR  Labs coag Recent Labs    10/06/19 0543 10/07/19 0051  INR 1.3* 1.4*    Recent Labs    10/06/19 0543 10/07/19 0051  NA 129* 131*  K 4.7 4.6  CL 97* 97*  CO2 23 26  GLUCOSE 135* 149*  BUN 14 13  CREATININE 0.81 0.95  CALCIUM 8.2* 8.5*     ASSESSMENT AND PLAN: 64 y.o. male with the following: Trimalleolar ankle fracture   Patient require surgical intervention as he is unstable fracture.  Discussed risk benefits alternatives including but not limited to infection, hardware prominence or pain, postoperative arthrosis and stiffness amongst others.  Patient elected proceed with surgery.  We will plan for an ORIF today and splinting afterwards.  He will likely be nonweightbearing for 6 weeks and casted.  He will be appropriate for discharge from orthopedic perspective after surgery at the discretion of his primary team.  Follow-up with me will be in 1 week.

## 2019-10-08 LAB — CBC WITH DIFFERENTIAL/PLATELET
Abs Immature Granulocytes: 0 10*3/uL (ref 0.00–0.07)
Basophils Absolute: 0 10*3/uL (ref 0.0–0.1)
Basophils Relative: 0 %
Eosinophils Absolute: 0 10*3/uL (ref 0.0–0.5)
Eosinophils Relative: 0 %
HCT: 28.8 % — ABNORMAL LOW (ref 39.0–52.0)
Hemoglobin: 9.2 g/dL — ABNORMAL LOW (ref 13.0–17.0)
Lymphocytes Relative: 1 %
Lymphs Abs: 0.8 10*3/uL (ref 0.7–4.0)
MCH: 33 pg (ref 26.0–34.0)
MCHC: 31.9 g/dL (ref 30.0–36.0)
MCV: 103.2 fL — ABNORMAL HIGH (ref 80.0–100.0)
Monocytes Absolute: 1.5 10*3/uL — ABNORMAL HIGH (ref 0.1–1.0)
Monocytes Relative: 2 %
Neutro Abs: 72.9 10*3/uL — ABNORMAL HIGH (ref 1.7–7.7)
Neutrophils Relative %: 97 %
Platelets: 237 10*3/uL (ref 150–400)
RBC: 2.79 MIL/uL — ABNORMAL LOW (ref 4.22–5.81)
RDW: 20.7 % — ABNORMAL HIGH (ref 11.5–15.5)
WBC: 75.2 10*3/uL (ref 4.0–10.5)
nRBC: 0 /100 WBC
nRBC: 0.6 % — ABNORMAL HIGH (ref 0.0–0.2)

## 2019-10-08 LAB — BASIC METABOLIC PANEL
Anion gap: 9 (ref 5–15)
BUN: 13 mg/dL (ref 8–23)
CO2: 26 mmol/L (ref 22–32)
Calcium: 8.5 mg/dL — ABNORMAL LOW (ref 8.9–10.3)
Chloride: 97 mmol/L — ABNORMAL LOW (ref 98–111)
Creatinine, Ser: 0.78 mg/dL (ref 0.61–1.24)
GFR calc Af Amer: >60 mL/min (ref >60)
GFR calc non Af Amer: >60 mL/min (ref >60)
Glucose, Bld: 155 mg/dL — ABNORMAL HIGH (ref 70–99)
Potassium: 4 mmol/L (ref 3.5–5.1)
Sodium: 132 mmol/L — ABNORMAL LOW (ref 135–145)

## 2019-10-08 LAB — GLUCOSE, CAPILLARY
Glucose-Capillary: 136 mg/dL — ABNORMAL HIGH (ref 70–99)
Glucose-Capillary: 147 mg/dL — ABNORMAL HIGH (ref 70–99)
Glucose-Capillary: 175 mg/dL — ABNORMAL HIGH (ref 70–99)
Glucose-Capillary: 168 mg/dL — ABNORMAL HIGH (ref 70–99)
Glucose-Capillary: 140 mg/dL — ABNORMAL HIGH (ref 70–99)

## 2019-10-08 LAB — CBC
HCT: 27.7 % — ABNORMAL LOW (ref 39.0–52.0)
Hemoglobin: 8.9 g/dL — ABNORMAL LOW (ref 13.0–17.0)
MCH: 33.1 pg (ref 26.0–34.0)
MCHC: 32.1 g/dL (ref 30.0–36.0)
MCV: 103 fL — ABNORMAL HIGH (ref 80.0–100.0)
Platelets: 220 10*3/uL (ref 150–400)
RBC: 2.69 MIL/uL — ABNORMAL LOW (ref 4.22–5.81)
RDW: 20.6 % — ABNORMAL HIGH (ref 11.5–15.5)
WBC: 69.3 10*3/uL (ref 4.0–10.5)
nRBC: 0.5 % — ABNORMAL HIGH (ref 0.0–0.2)

## 2019-10-08 MED ORDER — POLYETHYLENE GLYCOL 3350 17 G PO PACK
17.0000 g | PACK | Freq: Every day | ORAL | Status: DC
  Administered 2019-10-08: 17 g via ORAL
  Filled 2019-10-08 (×3): qty 1

## 2019-10-08 MED ORDER — SENNOSIDES-DOCUSATE SODIUM 8.6-50 MG PO TABS
1.0000 | ORAL_TABLET | Freq: Two times a day (BID) | ORAL | Status: DC
  Administered 2019-10-08 – 2019-10-12 (×8): 1 via ORAL
  Filled 2019-10-08 (×9): qty 1
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
## 2019-10-08 NOTE — Progress Notes (Signed)
1 Day Post-Op  Subjective: Doesn't care much for the food.  Has eaten some that his wife brought him.  No BM but lots of flatus.  Foley in place for retention.  Pain is well controlled, but has partial block still.  Starting to get some sensation back.  Denies abdominal pain.  Doesn't feel bloated  ROS: See above, otherwise other systems negative  Objective: Vital signs in last 24 hours: Temp:  [97.4 F (36.3 C)-98.1 F (36.7 C)] 97.6 F (36.4 C) (09/19 0747) Pulse Rate:  [68-104] 104 (09/19 0747) Resp:  [15-20] 20 (09/19 0747) BP: (97-156)/(52-81) 154/68 (09/19 0747) SpO2:  [96 %-99 %] 98 % (09/19 0747)    Intake/Output from previous day: 09/18 0701 - 09/19 0700 In: 1500 [I.V.:1400; IV Piggyback:100] Out: 1900 [Urine:1875; Blood:25] Intake/Output this shift: No intake/output data recorded.  PE: General appearance: alert and cooperative Resp: clear to auscultation bilaterally Cardio: regular rate and rhythm GI: soft, NT, ND Extremities: splint R ankle, moves toes some and has some mild sensation but not completely as he still has block in effect Neurologic: Mental status: Alert, oriented, thought content appropriate  Psych: A&Ox3  Lab Results:  Recent Labs    10/07/19 0051 10/07/19 0051 10/07/19 1156 10/08/19 0643  WBC 47.6*  --   --  69.3*  HGB 10.6*   < > 8.9* 8.9*  HCT 32.9*   < > 27.8* 27.7*  PLT 215  --   --  220   < > = values in this interval not displayed.   BMET Recent Labs    10/07/19 0051 10/08/19 0643  NA 131* 132*  K 4.6 4.0  CL 97* 97*  CO2 26 26  GLUCOSE 149* 155*  BUN 13 13  CREATININE 0.95 0.78  CALCIUM 8.5* 8.5*   PT/INR Recent Labs    10/06/19 0543 10/07/19 0051  LABPROT 16.0* 17.0*  INR 1.3* 1.4*   CMP     Component Value Date/Time   NA 132 (L) 10/08/2019 0643   K 4.0 10/08/2019 0643   CL 97 (L) 10/08/2019 0643   CO2 26 10/08/2019 0643   GLUCOSE 155 (H) 10/08/2019 0643   BUN 13 10/08/2019 0643   CREATININE 0.78  10/08/2019 0643   CREATININE 0.84 07/11/2019 0952   CALCIUM 8.5 (L) 10/08/2019 0643   PROT 6.2 (L) 10/07/2019 0051   ALBUMIN 2.7 (L) 10/07/2019 0051   AST 35 10/07/2019 0051   AST 23 07/11/2019 0952   ALT 19 10/07/2019 0051   ALT 10 07/11/2019 0952   ALKPHOS 98 10/07/2019 0051   BILITOT 4.2 (H) 10/07/2019 0051   BILITOT 0.7 07/11/2019 0952   GFRNONAA >60 10/08/2019 0643   GFRNONAA >60 07/11/2019 0952   GFRAA >60 10/08/2019 0643   GFRAA >60 07/11/2019 0952   Lipase  No results found for: LIPASE     Studies/Results: DG Ankle Complete Right  Result Date: 10/07/2019 CLINICAL DATA:  ORIF of right ankle fracture. EXAM: RIGHT ANKLE - COMPLETE 3+ VIEW COMPARISON:  None. FINDINGS: The patient is status post ORIF of right ankle fracture. Alignment is significantly improved. A screw extends through the medial malleolus. A plate is affixed to the distal fibula with multiple screws. The patient is now within a cast. IMPRESSION: ORIF of right ankle fracture as above. Electronically Signed   By: Dorise Bullion III M.D   On: 10/07/2019 11:09   DG Ankle Right Port  Result Date: 10/07/2019 CLINICAL DATA:  ORIF of right ankle  fracture. EXAM: RIGHT ANKLE - COMPLETE 3+ VIEW COMPARISON:  None. FINDINGS: The patient is status post ORIF of right ankle fracture. Alignment is significantly improved. A screw extends through the medial malleolus. A plate is affixed to the distal fibula with multiple screws. The patient is now within a cast. IMPRESSION: ORIF of right ankle fracture as above. Electronically Signed   By: Dorise Bullion III M.D   On: 10/07/2019 11:11   DG C-Arm 1-60 Min  Result Date: 10/07/2019 CLINICAL DATA:  ORIF of right ankle fracture. EXAM: DG C-ARM 1-60 MIN FLUOROSCOPY TIME:  Fluoroscopy Time:  34 seconds Number of Acquired Spot Images: 2 COMPARISON:  None. FINDINGS: A plate has been affixed to the distal fibula, crossing the known fracture. A screw now extends to the medial malleolus,  crossing the known fracture. The alignment has significantly improved. The known posterior malleolar fracture is again identified. There may be a small fracture off the anterior aspect of the distal tibia as well. IMPRESSION: ORIF of right ankle fracture as above. Electronically Signed   By: Dorise Bullion III M.D   On: 10/07/2019 11:11   DG MINI C-ARM IMAGE ONLY  Result Date: 10/07/2019 There is no interpretation for this exam.  This order is for images obtained during a surgical procedure.  Please See "Surgeries" Tab for more information regarding the procedure.    Anti-infectives: Anti-infectives (From admission, onward)   Start     Dose/Rate Route Frequency Ordered Stop   10/07/19 1400  ceFAZolin (ANCEF) IVPB 1 g/50 mL premix        1 g 100 mL/hr over 30 Minutes Intravenous Every 6 hours 10/07/19 1025 10/08/19 0212   10/07/19 0853  vancomycin (VANCOCIN) powder  Status:  Discontinued          As needed 10/07/19 0853 10/07/19 0932   10/07/19 0715  ceFAZolin (ANCEF) IVPB 2g/100 mL premix        2 g 200 mL/hr over 30 Minutes Intravenous On call to O.R. 10/06/19 1817 10/07/19 0830       Assessment/Plan [facility] and 2 falls with delayed presentation  Grade 1 liver lac - follow Hb  Large R RP hematoma - complicated by anticoagulation on Eliquis (reversed with K-Centra), Hb stable down slightly today, but will follow Paresthesia from lumbosacral plexus compression secondary to retroperitoneal hemorrhage ABL anemia - see above Afib on Eliquis - see above, home lopressor R HTX - small, Pulm toilet, IS Acute urinary retention - foley, Flomax and urecholine, voiding trial 9/20 am at 0600am Hypernatremia and hyperkalemia - improving, appreciate TRH care AKI - IVF Chronic leukocytosis R Bimal FX - ORIF Dr. Griffin Basil 9/18 FEN - carb mod diet VTE - no anticoagulant, L PAS Dispo - therapies post op, voiding trial in am   LOS: 4 days    Henreitta Cea , [facility]  Surgery 10/08/2019, 10:36 AM Please see Amion for pager number during day hours 7:00am-4:30pm or 7:00am -11:30am on weekends

## 2019-10-08 NOTE — Progress Notes (Signed)
   ORTHOPAEDIC PROGRESS NOTE  s/p Procedure(s): OPEN REDUCTION INTERNAL FIXATION (ORIF) ANKLE FRACTURE  SUBJECTIVE: Reports mild pain about operative site. Does not like the cafeteria food No other complaints.  OBJECTIVE: PE: General: alert, no acute distress Right lower extremity: splint CDI, he is able to wiggle his toes, sensation intact distally with warm well perfused foot, no pain w passive stretch   Vitals:   10/08/19 0337 10/08/19 0747  BP: (!) 154/74 (!) 154/68  Pulse: 87 (!) 104  Resp: 16 20  Temp: (!) 97.5 F (36.4 C) 97.6 F (36.4 C)  SpO2: 98% 98%    ASSESSMENT: Justin [name] is a 64 y.o. male status post above. POD#1  PLAN: Weightbearing: NWB RLE Insicional and dressing care: Reinforce dressings as needed Keep splint clean and dry Orthopedic device(s): Splint Showering: Post-op day #3 with assistance. Keep splint dry.  VTE prophylaxis: No anticoagulant, L PAS Pain control: Per trauma Follow - up plan: 1 week in office with Dr. Damien Fusi information:  Dr. Ophelia Charter,  Noemi Chapel PA-C, After hours and holidays please check [email or web address] for group call information for [facility]  Dispo: TBD. PT/OT evals pending.   Noemi Chapel, PA-C 10/08/2019
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
## 2019-10-08 NOTE — Progress Notes (Signed)
Date and time results received: 10/08/19 0800 (use smartphrase ".now" to insert current time)  Test: WBC Critical Value: 69.3  Name of Provider Notified: Dr Fanny Bien  Orders Received? Or Actions Taken?: new orders, Dr Broadus John came in and seen pt.
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
## 2019-10-08 NOTE — Plan of Care (Signed)
  Problem: Activity: Goal: Ability to avoid complications of mobility impairment will improve Outcome: Progressing Goal: Ability to tolerate increased activity will improve Outcome: Progressing   Problem: Education: Goal: Verbalization of understanding the information provided will improve Outcome: Progressing   Problem: Coping: Goal: Level of anxiety will decrease Outcome: Progressing   Problem: Physical Regulation: Goal: Postoperative complications will be avoided or minimized Outcome: Progressing   Problem: Respiratory: Goal: Ability to maintain a clear airway will improve Outcome: Progressing   Problem: Pain Management: Goal: Pain level will decrease Outcome: Progressing   Problem: Skin Integrity: Goal: Signs of wound healing will improve Outcome: Progressing   Problem: Tissue Perfusion: Goal: Ability to maintain adequate tissue perfusion will improve Outcome: Progressing   

## 2019-10-09 LAB — CBC WITH DIFFERENTIAL/PLATELET
Abs Immature Granulocytes: 4.71 10*3/uL — ABNORMAL HIGH (ref 0.00–0.07)
Basophils Absolute: 0.4 10*3/uL — ABNORMAL HIGH (ref 0.0–0.1)
Basophils Relative: 1 %
Eosinophils Absolute: 0.1 10*3/uL (ref 0.0–0.5)
Eosinophils Relative: 0 %
HCT: 30.8 % — ABNORMAL LOW (ref 39.0–52.0)
Hemoglobin: 9.6 g/dL — ABNORMAL LOW (ref 13.0–17.0)
Immature Granulocytes: 7 %
Lymphocytes Relative: 1 %
Lymphs Abs: 0.9 10*3/uL (ref 0.7–4.0)
MCH: 32.1 pg (ref 26.0–34.0)
MCHC: 31.2 g/dL (ref 30.0–36.0)
MCV: 103 fL — ABNORMAL HIGH (ref 80.0–100.0)
Monocytes Absolute: 3.3 10*3/uL — ABNORMAL HIGH (ref 0.1–1.0)
Monocytes Relative: 5 %
Neutro Abs: 63 10*3/uL — ABNORMAL HIGH (ref 1.7–7.7)
Neutrophils Relative %: 86 %
Platelets: 239 10*3/uL (ref 150–400)
RBC: 2.99 MIL/uL — ABNORMAL LOW (ref 4.22–5.81)
RDW: 20.8 % — ABNORMAL HIGH (ref 11.5–15.5)
WBC: 72.4 10*3/uL (ref 4.0–10.5)
nRBC: 0.9 % — ABNORMAL HIGH (ref 0.0–0.2)

## 2019-10-09 LAB — COMPREHENSIVE METABOLIC PANEL
ALT: 26 U/L (ref 0–44)
AST: 46 U/L — ABNORMAL HIGH (ref 15–41)
Albumin: 2.4 g/dL — ABNORMAL LOW (ref 3.5–5.0)
Alkaline Phosphatase: 151 U/L — ABNORMAL HIGH (ref 38–126)
Anion gap: 10 (ref 5–15)
BUN: 14 mg/dL (ref 8–23)
CO2: 27 mmol/L (ref 22–32)
Calcium: 8.4 mg/dL — ABNORMAL LOW (ref 8.9–10.3)
Chloride: 96 mmol/L — ABNORMAL LOW (ref 98–111)
Creatinine, Ser: 0.8 mg/dL (ref 0.61–1.24)
GFR calc Af Amer: >60 mL/min (ref >60)
GFR calc non Af Amer: >60 mL/min (ref >60)
Glucose, Bld: 120 mg/dL — ABNORMAL HIGH (ref 70–99)
Potassium: 3.8 mmol/L (ref 3.5–5.1)
Sodium: 133 mmol/L — ABNORMAL LOW (ref 135–145)
Total Bilirubin: 3.9 mg/dL — ABNORMAL HIGH (ref 0.3–1.2)
Total Protein: 6.2 g/dL — ABNORMAL LOW (ref 6.5–8.1)

## 2019-10-09 LAB — GLUCOSE, CAPILLARY
Glucose-Capillary: 109 mg/dL — ABNORMAL HIGH (ref 70–99)
Glucose-Capillary: 105 mg/dL — ABNORMAL HIGH (ref 70–99)
Glucose-Capillary: 103 mg/dL — ABNORMAL HIGH (ref 70–99)
Glucose-Capillary: 114 mg/dL — ABNORMAL HIGH (ref 70–99)
Glucose-Capillary: 145 mg/dL — ABNORMAL HIGH (ref 70–99)
Glucose-Capillary: 160 mg/dL — ABNORMAL HIGH (ref 70–99)

## 2019-10-09 MED ORDER — FUROSEMIDE 10 MG/ML IJ SOLN
20.0000 mg | Freq: Once | INTRAMUSCULAR | Status: AC
  Administered 2019-10-09: 20 mg via INTRAVENOUS
  Filled 2019-10-09: qty 2

## 2019-10-09 MED ORDER — LACTULOSE 10 GM/15ML PO SOLN
20.0000 g | Freq: Two times a day (BID) | ORAL | Status: DC
  Administered 2019-10-09 – 2019-10-12 (×5): 20 g via ORAL
  Filled 2019-10-09 (×6): qty 30

## 2019-10-09 MED ORDER — POTASSIUM CHLORIDE CRYS ER 20 MEQ PO TBCR
40.0000 meq | EXTENDED_RELEASE_TABLET | Freq: Once | ORAL | Status: AC
  Administered 2019-10-09: 40 meq via ORAL
  Filled 2019-10-09: qty 2
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
## 2019-10-09 NOTE — NC FL2 (Signed)
[place] LEVEL OF CARE SCREENING TOOL     IDENTIFICATION  Patient Name: Justin [name] Birthdate: 25-Sep-1955 Sex: male Admission Date (Current Location): 10/04/2019  [facility] and [place] Number:  Herbalist and Address:  [facility], [place] [address] [place], [place] [address]      Provider Number: [id number]  Attending Physician Name and Address:  Md, Trauma, MD  Relative Name and Phone Number:  Bernd, Crom [phone]  [phone]    Current Level of Care: Hospital Recommended Level of Care: [place] Prior Approval Number:    Date Approved/Denied:   PASRR Number: [id number]  Discharge Plan: SNF    Current Diagnoses: Patient Active Problem List   Diagnosis Date Noted  . AKI (acute kidney injury) ([place]) 10/04/2019  . Closed displaced trimalleolar fracture of right ankle 10/04/2019  . Peritoneal hemorrhage 10/04/2019  . DM2 (diabetes mellitus, type 2) ([place]) 10/04/2019  . Acute urinary retention 10/04/2019  . Fall 10/04/2019  . Status post right hip replacement 12/23/2018  . PAD (peripheral artery disease) ([place]) 09/20/2018  . Melena   . Acute gastric ulcer with hemorrhage   . Symptomatic anemia 08/10/2018  . Acute blood loss anemia 08/10/2018  . GI bleed 08/10/2018  . Fatty liver   . Normocytic anemia 07/19/2018  . History of CEA (carotid endarterectomy) 02/09/2018  . History of tobacco use 02/02/2018  . Carotid stenosis 01/25/2018  . S/P CABG x 4 01/03/2018  . S/P aortic valve replacement with bioprosthetic valve 01/03/2018  . Coronary artery disease 01/03/2018  . Essential thrombocythemia ([place]) 12/21/2017  . Bicuspid aortic valve determined by imaging 12/09/2017  . Claudication ([place]) 12/09/2017  . Sleep apnea 12/09/2017  . Left ventricular systolic dysfunction without heart failure 12/09/2017  . Aortic stenosis 12/01/2017  . Erythrocytosis 11/19/2017  . Leukocytosis  11/19/2017  . Atrial fibrillation ([place]) 11/12/2017  . Essential hypertension 10/19/2013  . Pulmonary infiltrates 09/27/2013  . Cough 09/27/2013    Orientation RESPIRATION BLADDER Height & Weight     Self, Time, Situation, Place  O2 Continent Weight: 208 lb 12.4 oz (94.7 kg) Height:  5\' 8"  (172.7 cm)  BEHAVIORAL SYMPTOMS/MOOD NEUROLOGICAL BOWEL NUTRITION STATUS      Continent Diet (Carb modified.  See discharge summary)  AMBULATORY STATUS COMMUNICATION OF NEEDS Skin   Total Care Verbally Surgical wounds                       Personal Care Assistance Level of Assistance  Bathing, Feeding, Dressing Bathing Assistance: Maximum assistance Feeding assistance: Independent Dressing Assistance: Maximum assistance     Functional Limitations Info  Sight, Hearing, Speech Sight Info: Adequate Hearing Info: Adequate Speech Info: Adequate    SPECIAL CARE FACTORS FREQUENCY  PT (By licensed PT), OT (By licensed OT)     PT Frequency: 5x week OT Frequency: 5x week            Contractures Contractures Info: Not present    Additional Factors Info  Code Status, Allergies, Insulin Sliding Scale Code Status Info: full Allergies Info: codeine   Insulin Sliding Scale Info: 0-9 units q4 hours       Current Medications (10/09/2019):  This is the current hospital active medication list Current Facility-Administered Medications  Medication Dose Route Frequency Provider Last Rate Last Admin  . Chlorhexidine Gluconate Cloth 2 % PADS 6 each  6 each Topical Daily McBane, Maylene Roes, PA-C   6 each at  10/09/19 1010  . cyclobenzaprine (FLEXERIL) tablet 10 mg  10 mg Oral TID Ethelda Chick, PA-C   10 mg at 10/09/19 1015  . HYDROcodone-acetaminophen (NORCO) 7.5-325 MG per tablet 1 tablet  1 tablet Oral Q4H PRN Ethelda Chick, PA-C   1 tablet at 10/09/19 1014  . HYDROcodone-acetaminophen (NORCO/VICODIN) 5-325 MG per tablet 1 tablet  1 tablet Oral Q4H PRN McBane, Maylene Roes, PA-C       . insulin aspart (novoLOG) injection 0-9 Units  0-9 Units Subcutaneous Q4H Ethelda Chick, PA-C   2 Units at 10/08/19 1657  . lactulose (CHRONULAC) 10 GM/15ML solution 20 g  20 g Oral BID Domenic Polite, MD   20 g at 10/09/19 1014  . loratadine (CLARITIN) tablet 10 mg  10 mg Oral Daily Ethelda Chick, PA-C   10 mg at 10/09/19 1014  . metoprolol succinate (TOPROL-XL) 24 hr tablet 25 mg  25 mg Oral Daily Ethelda Chick, PA-C   25 mg at 10/09/19 1015  . ondansetron (ZOFRAN) tablet 4 mg  4 mg Oral Q6H PRN McBane, Maylene Roes, PA-C       Or  . ondansetron (ZOFRAN) injection 4 mg  4 mg Intravenous Q6H PRN McBane, Maylene Roes, PA-C      . pantoprazole (PROTONIX) EC tablet 40 mg  40 mg Oral Daily McBane, Caroline N, PA-C   40 mg at 10/09/19 1016  . polyvinyl alcohol (LIQUIFILM TEARS) 1.4 % ophthalmic solution 1 drop  1 drop Both Eyes Daily PRN McBane, Maylene Roes, PA-C      . potassium chloride SA (KLOR-CON) CR tablet 40 mEq  40 mEq Oral Once Domenic Polite, MD      . pregabalin (LYRICA) capsule 50 mg  50 mg Oral TID Ethelda Chick, PA-C   50 mg at 10/09/19 1014  . prochlorperazine (COMPAZINE) tablet 10 mg  10 mg Oral Q6H PRN McBane, Maylene Roes, PA-C       Or  . prochlorperazine (COMPAZINE) injection 5-10 mg  5-10 mg Intravenous Q6H PRN Ethelda Chick, PA-C   5 mg at 10/05/19 0309  . rosuvastatin (CRESTOR) tablet 20 mg  20 mg Oral Daily Ethelda Chick, PA-C   20 mg at 10/09/19 1015  . senna-docusate (Senokot-S) tablet 1 tablet  1 tablet Oral BID Domenic Polite, MD   1 tablet at 10/09/19 1015  . tamsulosin (FLOMAX) capsule 0.4 mg  0.4 mg Oral Daily Ethelda Chick, PA-C   0.4 mg at 10/09/19 1015     Discharge Medications: Please see discharge summary for a list of discharge medications.  Relevant Imaging Results:  Relevant Lab Results:   Additional Information SSN [id number]  Joanne Chars, LCSW

## 2019-10-09 NOTE — Progress Notes (Addendum)
   ORTHOPAEDIC PROGRESS NOTE  s/p Procedure(s): OPEN REDUCTION INTERNAL FIXATION (ORIF) ANKLE FRACTURE on 10/07/2019  SUBJECTIVE: Reports minimal pain about operative site. Starting to get sensation back. No other complaints.  OBJECTIVE: PE: General: alert, no acute distress Right lower extremity: splint CDI, he is able to wiggle his toes, sensation intact distally with warm well perfused foot, no pain w passive stretch   Vitals:   10/09/19 0310 10/09/19 0827  BP: 123/65 (!) 149/72  Pulse: 99 68  Resp: 17 16  Temp: 97.8 F (36.6 C) 97.9 F (36.6 C)  SpO2: 97% 97%    ASSESSMENT: Justin [name] is a 64 y.o. male status post above. POD#2  PLAN: Weightbearing: NWB RLE Insicional and dressing care: Reinforce dressings as needed Keep splint clean and dry Orthopedic device(s): Splint Showering: Post-op day #3 with assistance. Keep splint dry.  VTE prophylaxis: SCDs. No anticoagulant, L PAS Pain control: Per trauma. He requests d/c Dilaudid - he does not like the way it makes him feel.  Follow - up plan: 1 week in office with Dr. Damien Fusi information:  Dr. Ophelia Charter,  Noemi Chapel PA-C, After hours and holidays please check [email or web address] for group call information for [facility]  Chronic leukocytosis: appreciate hospitalist input. 72.4 this morning. Afebrile this morning.   Dispo: TBD. PT/OT recommending SNF.   Noemi Chapel, PA-C 10/09/2019

## 2019-10-09 NOTE — TOC Initial Note (Signed)
Transition of Care [facility]) - Initial/Assessment Note    Patient Details  Name: Justin [name] MRN: [id number] Date of Birth: 31-Oct-1955  [facility] of [facility]) CM/SW Contact:    Joanne Chars, LCSW Phone Number: 10/09/2019, 3:41 PM  Clinical Narrative:     CSW met with pt to discuss discharge recommendations for SNF.  Pt agreeable to this. Choice document provided.  Permission given to speak with wife and send out info in hub.  Pt interested in [facility] as pt is a [employer].  No current services, PCP in place.    Pt is vaccinated.            Expected Discharge Plan: Skilled Nursing Facility Barriers to Discharge: Continued Medical Work up, SNF Pending bed offer   Patient Goals and CMS Choice Patient states their goals for this hospitalization and ongoing recovery are:: get back to 100% CMS Medicare.gov Compare Post Acute Care list provided to:: Patient Choice offered to / list presented to : Patient  Expected Discharge Plan and Services Expected Discharge Plan: [place] Choice: [place] arrangements for the past 2 months: Single Family Home                                      Prior Living Arrangements/Services Living arrangements for the past 2 months: Single Family Home Lives with:: Spouse Patient language and need for interpreter reviewed:: Yes Do you feel safe going back to the place where you live?: Yes      Need for Family Participation in Patient Care: No (Comment) Care giver support system in place?: Yes (comment) (wife)   Criminal Activity/Legal Involvement Pertinent to Current Situation/Hospitalization: No - Comment as needed  Activities of Daily Living Home Assistive Devices/Equipment: Dentures (specify type) ADL Screening (condition at time of admission) Patient's cognitive ability adequate to safely complete daily activities?: Yes Is the patient deaf or have difficulty hearing?:  No Does the patient have difficulty seeing, even when wearing glasses/contacts?: No Does the patient have difficulty concentrating, remembering, or making decisions?: No Patient able to express need for assistance with ADLs?: Yes Does the patient have difficulty dressing or bathing?: No Independently performs ADLs?: Yes (appropriate for developmental age) Does the patient have difficulty walking or climbing stairs?: Yes Weakness of Legs: Both Weakness of Arms/Hands: None  Permission Sought/Granted Permission sought to share information with : Facility Sport and exercise psychologist, Family Supports Permission granted to share information with : Yes, Verbal Permission Granted  Share Information with NAME: [name], wife  Permission granted to share info w AGENCY: SNF        Emotional Assessment Appearance:: Appears stated age Attitude/Demeanor/Rapport: Engaged Affect (typically observed): Pleasant Orientation: : Oriented to Self, Oriented to Place, Oriented to  Time, Oriented to Situation Alcohol / Substance Use: Not Applicable Psych Involvement: No (comment)  Admission diagnosis:  Hemothorax [J94.2] Hemoperitoneum [K66.1] Fall [W19.XXXA] Retroperitoneal bleed [R58] Closed fracture of right ankle, initial encounter [S82.891A] Laceration of liver, initial encounter [S36.113A] Bimalleolar ankle fracture, right, closed, initial encounter [H03.888K] Patient Active Problem List   Diagnosis Date Noted   AKI (acute kidney injury) ([place]) 10/04/2019   Closed displaced trimalleolar fracture of right ankle 10/04/2019   Peritoneal hemorrhage 10/04/2019   DM2 (diabetes mellitus, type 2) ([place]) 10/04/2019   Acute urinary retention 10/04/2019   Fall 10/04/2019   Status post right hip replacement  12/23/2018   PAD (peripheral artery disease) ([place]) 09/20/2018   Melena    Acute gastric ulcer with hemorrhage    Symptomatic anemia 08/10/2018   Acute blood loss anemia 08/10/2018   GI bleed  08/10/2018   Fatty liver    Normocytic anemia 07/19/2018   History of CEA (carotid endarterectomy) 02/09/2018   History of tobacco use 02/02/2018   Carotid stenosis 01/25/2018   S/P CABG x 4 01/03/2018   S/P aortic valve replacement with bioprosthetic valve 01/03/2018   Coronary artery disease 01/03/2018   Essential thrombocythemia ([place]) 12/21/2017   Bicuspid aortic valve determined by imaging 12/09/2017   Claudication ([place]) 12/09/2017   Sleep apnea 12/09/2017   Left ventricular systolic dysfunction without heart failure 12/09/2017   Aortic stenosis 12/01/2017   Erythrocytosis 11/19/2017   Leukocytosis 11/19/2017   Atrial fibrillation ([place]) 11/12/2017   Essential hypertension 10/19/2013   Pulmonary infiltrates 09/27/2013   Cough 09/27/2013   PCP:  [name], MD Pharmacy:   [id number] [id number]: [phone]: [phone]    Social Determinants of Health (SDOH) Interventions    Readmission Risk Interventions No flowsheet data found.

## 2019-10-09 NOTE — Discharge Instructions (Signed)
  Ophelia Charter MD, MPH [place] [address], [address] [phone] (tel)   [phone] (fax)   Justin [name] ? Please keep splint clean dry and intact until followup.  ? You may shower on Post-Op Day #2.  ? You must keep splint dry during this process and may find that a plastic bag taped around the leg or alternatively a towel based bath may be a better option.   ? If you get your splint wet or if it is damaged please contact our clinic.  EXERCISES ? Due to your splint being in place you will not be able to bear weight through your extremity.   ? DO NOT PUT ANY WEIGHT ON YOUR OPERATIVE LEG ? Please use crutches or a walker to avoid weight bearing.   FOLLOW-UP ? If you develop a Fever (>101.5), Redness or Drainage from the surgical incision site, please call our office to arrange for an evaluation. ? Please call the office to schedule a follow-up appointment for your incision check if you do not already have one, 7-10 days post-operatively.  IF YOU HAVE ANY QUESTIONS, PLEASE FEEL FREE TO CALL OUR OFFICE.  HELPFUL INFORMATION  ? If you had a block, it will wear off between 8-24 hrs postop typically.  This is period when your pain may go from nearly zero to the pain you would have had postop without the block.  This is an abrupt transition but nothing dangerous is happening.  You may take an extra dose of narcotic when this happens.  ? You should wean off your narcotic medicines as soon as you are able.  Most patients will be off or using minimal narcotics before their first postop appointment.   ? We suggest you use the pain medication the first night prior to going to bed, in order to ease any pain when the anesthesia wears off. You should avoid taking pain medications on an empty stomach as it will make you nauseous.  ? Do not drink alcoholic beverages or take illicit drugs when taking pain  medications.  ? In most states it is against the law to drive while you are in a splint or sling.  And certainly against the law to drive while taking narcotics.  ? You may return to work/school in the next couple of days when you feel up to it.   ? Pain medication may make you constipated.  Below are a few solutions to try in this order: - Decrease the amount of pain medication if you aren't having pain. - Drink lots of decaffeinated fluids. - Drink prune juice and/or each dried prunes  o If the first 3 don't work start with additional solutions - Take Colace - an over-the-counter stool softener - Take Senokot - an over-the-counter laxative - Take Miralax - a stronger over-the-counter laxative
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
## 2019-10-10 ENCOUNTER — Inpatient Hospital Stay (HOSPITAL_COMMUNITY): Payer: Medicare Other

## 2019-10-10 ENCOUNTER — Encounter (HOSPITAL_COMMUNITY): Payer: Self-pay | Admitting: Orthopaedic Surgery

## 2019-10-10 LAB — BASIC METABOLIC PANEL
Anion gap: 10 (ref 5–15)
BUN: 12 mg/dL (ref 8–23)
CO2: 27 mmol/L (ref 22–32)
Calcium: 8.7 mg/dL — ABNORMAL LOW (ref 8.9–10.3)
Chloride: 96 mmol/L — ABNORMAL LOW (ref 98–111)
Creatinine, Ser: 0.73 mg/dL (ref 0.61–1.24)
GFR calc Af Amer: >60 mL/min (ref >60)
GFR calc non Af Amer: >60 mL/min (ref >60)
Glucose, Bld: 110 mg/dL — ABNORMAL HIGH (ref 70–99)
Potassium: 3.8 mmol/L (ref 3.5–5.1)
Sodium: 133 mmol/L — ABNORMAL LOW (ref 135–145)

## 2019-10-10 LAB — GLUCOSE, CAPILLARY
Glucose-Capillary: 109 mg/dL — ABNORMAL HIGH (ref 70–99)
Glucose-Capillary: 111 mg/dL — ABNORMAL HIGH (ref 70–99)
Glucose-Capillary: 111 mg/dL — ABNORMAL HIGH (ref 70–99)
Glucose-Capillary: 120 mg/dL — ABNORMAL HIGH (ref 70–99)
Glucose-Capillary: 127 mg/dL — ABNORMAL HIGH (ref 70–99)
Glucose-Capillary: 96 mg/dL (ref 70–99)

## 2019-10-10 LAB — CBC
HCT: 31 % — ABNORMAL LOW (ref 39.0–52.0)
Hemoglobin: 9.8 g/dL — ABNORMAL LOW (ref 13.0–17.0)
MCH: 32.9 pg (ref 26.0–34.0)
MCHC: 31.6 g/dL (ref 30.0–36.0)
MCV: 104 fL — ABNORMAL HIGH (ref 80.0–100.0)
Platelets: 235 10*3/uL (ref 150–400)
RBC: 2.98 MIL/uL — ABNORMAL LOW (ref 4.22–5.81)
RDW: 21.5 % — ABNORMAL HIGH (ref 11.5–15.5)
WBC: 77.6 10*3/uL (ref 4.0–10.5)
nRBC: 0.4 % — ABNORMAL HIGH (ref 0.0–0.2)

## 2019-10-10 MED ORDER — BOOST / RESOURCE BREEZE PO LIQD CUSTOM
1.0000 | Freq: Three times a day (TID) | ORAL | Status: DC
  Administered 2019-10-11 – 2019-10-12 (×3): 1 via ORAL

## 2019-10-10 MED ORDER — ALBUTEROL SULFATE (2.5 MG/3ML) 0.083% IN NEBU: 3.0000 mL | INHALATION_SOLUTION | Freq: Four times a day (QID) | RESPIRATORY_TRACT | Status: DC | PRN
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
## 2019-10-10 NOTE — Plan of Care (Signed)
  Problem: Activity: Goal: Ability to avoid complications of mobility impairment will improve Outcome: Progressing Goal: Ability to tolerate increased activity will improve Outcome: Progressing   Problem: Pain Management: Goal: Pain level will decrease Outcome: Progressing   Problem: Skin Integrity: Goal: Signs of wound healing will improve Outcome: Progressing
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
## 2019-10-10 NOTE — Plan of Care (Signed)
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
## 2019-10-10 NOTE — Progress Notes (Signed)
Inpatient Rehab Admissions:  Inpatient Rehab Consult received.  Spoke with PT who re-assessed pt today.  Recommendations remain for SNF.  CIR will sign off at this time.   Signed: Shann Medal, PT, DPT Admissions Coordinator [phone] 10/10/19  1:34 PM

## 2019-10-11 LAB — COMPREHENSIVE METABOLIC PANEL
ALT: 20 U/L (ref 0–44)
AST: 55 U/L — ABNORMAL HIGH (ref 15–41)
Albumin: 2.4 g/dL — ABNORMAL LOW (ref 3.5–5.0)
Alkaline Phosphatase: 135 U/L — ABNORMAL HIGH (ref 38–126)
Anion gap: 10 (ref 5–15)
BUN: 13 mg/dL (ref 8–23)
CO2: 26 mmol/L (ref 22–32)
Calcium: 8.3 mg/dL — ABNORMAL LOW (ref 8.9–10.3)
Chloride: 96 mmol/L — ABNORMAL LOW (ref 98–111)
Creatinine, Ser: 0.71 mg/dL (ref 0.61–1.24)
GFR calc Af Amer: >60 mL/min (ref >60)
GFR calc non Af Amer: >60 mL/min (ref >60)
Glucose, Bld: 109 mg/dL — ABNORMAL HIGH (ref 70–99)
Potassium: 4.1 mmol/L (ref 3.5–5.1)
Sodium: 132 mmol/L — ABNORMAL LOW (ref 135–145)
Total Bilirubin: 3.6 mg/dL — ABNORMAL HIGH (ref 0.3–1.2)
Total Protein: 5.7 g/dL — ABNORMAL LOW (ref 6.5–8.1)

## 2019-10-11 LAB — CBC
HCT: 31.7 % — ABNORMAL LOW (ref 39.0–52.0)
Hemoglobin: 10 g/dL — ABNORMAL LOW (ref 13.0–17.0)
MCH: 33.3 pg (ref 26.0–34.0)
MCHC: 31.5 g/dL (ref 30.0–36.0)
MCV: 105.7 fL — ABNORMAL HIGH (ref 80.0–100.0)
Platelets: 237 10*3/uL (ref 150–400)
RBC: 3 MIL/uL — ABNORMAL LOW (ref 4.22–5.81)
RDW: 21.5 % — ABNORMAL HIGH (ref 11.5–15.5)
WBC: 63.6 10*3/uL (ref 4.0–10.5)
nRBC: 0.3 % — ABNORMAL HIGH (ref 0.0–0.2)

## 2019-10-11 LAB — GLUCOSE, CAPILLARY
Glucose-Capillary: 108 mg/dL — ABNORMAL HIGH (ref 70–99)
Glucose-Capillary: 109 mg/dL — ABNORMAL HIGH (ref 70–99)
Glucose-Capillary: 121 mg/dL — ABNORMAL HIGH (ref 70–99)
Glucose-Capillary: 153 mg/dL — ABNORMAL HIGH (ref 70–99)
Glucose-Capillary: 159 mg/dL — ABNORMAL HIGH (ref 70–99)
Glucose-Capillary: 180 mg/dL — ABNORMAL HIGH (ref 70–99)

## 2019-10-11 MED ORDER — DOCUSATE SODIUM 100 MG PO CAPS
100.0000 mg | ORAL_CAPSULE | Freq: Two times a day (BID) | ORAL | Status: DC
  Administered 2019-10-11 – 2019-10-12 (×2): 100 mg via ORAL
  Filled 2019-10-11 (×2): qty 1

## 2019-10-11 MED ORDER — ZOLPIDEM TARTRATE 5 MG PO TABS
5.0000 mg | ORAL_TABLET | Freq: Every evening | ORAL | Status: DC | PRN
  Administered 2019-10-11: 5 mg via ORAL
  Filled 2019-10-11: qty 1

## 2019-10-11 MED ORDER — ENOXAPARIN SODIUM 30 MG/0.3ML ~~LOC~~ SOLN
30.0000 mg | Freq: Two times a day (BID) | SUBCUTANEOUS | Status: DC
  Administered 2019-10-11: 30 mg via SUBCUTANEOUS
  Filled 2019-10-11: qty 0.3

## 2019-10-11 MED ORDER — SODIUM CHLORIDE 1 G PO TABS
1.0000 g | ORAL_TABLET | Freq: Two times a day (BID) | ORAL | Status: DC
  Administered 2019-10-11 – 2019-10-12 (×2): 1 g via ORAL
  Filled 2019-10-11 (×4): qty 1

## 2019-10-11 MED ORDER — POLYETHYLENE GLYCOL 3350 17 G PO PACK: 17.0000 g | PACK | Freq: Every day | ORAL | Status: DC

## 2019-10-11 NOTE — TOC Progression Note (Signed)
Transition of Care [facility]) - Progression Note    Patient Details  Name: Justin [name] MRN: [id number] Date of Birth: [birth year]  Transition [facility] Care [facility]) CM/SW [place], [place] Phone Number: 10/11/2019, 2:48 PM  Clinical Narrative:    CSW spoke with pt and pt's spouse by phone.  Pt's spouse would like a bed at [facility] or [place] for SNF.  CSW contacted [facility] for possible placement.  [facility] will not have any beds available until Friday and [facility] will not have any beds until Monday.  TOC Team will continue to assist with disposition planning.   Expected Discharge Plan: [place] Barriers to Discharge: Continued Medical Work up, SNF Pending bed offer  Expected Discharge Plan and Services Expected Discharge Plan: [place] Choice: [place] arrangements for the past 2 months: Single Family Home                                       Social Determinants of Health (SDOH) Interventions    Readmission Risk Interventions No flowsheet data found.
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
## 2019-10-11 NOTE — Care Management Important Message (Signed)
Important Message  Patient Details  Name: Justin [name] MRN: [id number] Date of Birth: 01/19/1956   Medicare Important Message Given:  Yes - Important Message mailed due to current National Emergency  Verbal consent obtained due to current National Emergency  Relationship to patient: Self Contact Name: Per Beagley Call Date: 10/11/19  Time: 1135 Phone: [phone] Outcome: Spoke with contact Important Message mailed to: Patient address on file    Delorse Lek 10/11/2019, 11:35 AM

## 2019-10-12 ENCOUNTER — Encounter (HOSPITAL_COMMUNITY): Payer: Self-pay | Admitting: Orthopaedic Surgery

## 2019-10-12 DIAGNOSIS — R918 Other nonspecific abnormal finding of lung field: Secondary | ICD-10-CM | POA: Diagnosis not present

## 2019-10-12 DIAGNOSIS — S82854A Nondisplaced trimalleolar fracture of right lower leg, initial encounter for closed fracture: Secondary | ICD-10-CM | POA: Diagnosis not present

## 2019-10-12 DIAGNOSIS — S82854D Nondisplaced trimalleolar fracture of right lower leg, subsequent encounter for closed fracture with routine healing: Secondary | ICD-10-CM | POA: Diagnosis not present

## 2019-10-12 DIAGNOSIS — R7989 Other specified abnormal findings of blood chemistry: Secondary | ICD-10-CM | POA: Diagnosis not present

## 2019-10-12 DIAGNOSIS — D649 Anemia, unspecified: Secondary | ICD-10-CM | POA: Diagnosis not present

## 2019-10-12 DIAGNOSIS — D62 Acute posthemorrhagic anemia: Secondary | ICD-10-CM | POA: Diagnosis not present

## 2019-10-12 DIAGNOSIS — S82851D Displaced trimalleolar fracture of right lower leg, subsequent encounter for closed fracture with routine healing: Secondary | ICD-10-CM | POA: Diagnosis not present

## 2019-10-12 DIAGNOSIS — J181 Lobar pneumonia, unspecified organism: Secondary | ICD-10-CM | POA: Diagnosis not present

## 2019-10-12 DIAGNOSIS — R269 Unspecified abnormalities of gait and mobility: Secondary | ICD-10-CM | POA: Diagnosis not present

## 2019-10-12 DIAGNOSIS — I480 Paroxysmal atrial fibrillation: Secondary | ICD-10-CM | POA: Diagnosis not present

## 2019-10-12 DIAGNOSIS — K661 Hemoperitoneum: Secondary | ICD-10-CM | POA: Diagnosis not present

## 2019-10-12 DIAGNOSIS — M2550 Pain in unspecified joint: Secondary | ICD-10-CM | POA: Diagnosis not present

## 2019-10-12 DIAGNOSIS — J189 Pneumonia, unspecified organism: Secondary | ICD-10-CM | POA: Diagnosis not present

## 2019-10-12 DIAGNOSIS — S271XXD Traumatic hemothorax, subsequent encounter: Secondary | ICD-10-CM | POA: Diagnosis not present

## 2019-10-12 DIAGNOSIS — E1169 Type 2 diabetes mellitus with other specified complication: Secondary | ICD-10-CM | POA: Diagnosis not present

## 2019-10-12 DIAGNOSIS — J984 Other disorders of lung: Secondary | ICD-10-CM | POA: Diagnosis not present

## 2019-10-12 DIAGNOSIS — M6281 Muscle weakness (generalized): Secondary | ICD-10-CM | POA: Diagnosis not present

## 2019-10-12 DIAGNOSIS — R2681 Unsteadiness on feet: Secondary | ICD-10-CM | POA: Diagnosis not present

## 2019-10-12 DIAGNOSIS — N319 Neuromuscular dysfunction of bladder, unspecified: Secondary | ICD-10-CM | POA: Diagnosis not present

## 2019-10-12 DIAGNOSIS — S271XXA Traumatic hemothorax, initial encounter: Secondary | ICD-10-CM | POA: Diagnosis not present

## 2019-10-12 DIAGNOSIS — U071 COVID-19: Secondary | ICD-10-CM | POA: Diagnosis not present

## 2019-10-12 DIAGNOSIS — R58 Hemorrhage, not elsewhere classified: Secondary | ICD-10-CM | POA: Diagnosis not present

## 2019-10-12 DIAGNOSIS — Z20828 Contact with and (suspected) exposure to other viral communicable diseases: Secondary | ICD-10-CM | POA: Diagnosis not present

## 2019-10-12 DIAGNOSIS — I4891 Unspecified atrial fibrillation: Secondary | ICD-10-CM | POA: Diagnosis not present

## 2019-10-12 DIAGNOSIS — S36114A Minor laceration of liver, initial encounter: Secondary | ICD-10-CM | POA: Diagnosis not present

## 2019-10-12 DIAGNOSIS — Z7401 Bed confinement status: Secondary | ICD-10-CM | POA: Diagnosis not present

## 2019-10-12 DIAGNOSIS — Z96661 Presence of right artificial ankle joint: Secondary | ICD-10-CM | POA: Diagnosis not present

## 2019-10-12 DIAGNOSIS — J942 Hemothorax: Secondary | ICD-10-CM | POA: Diagnosis not present

## 2019-10-12 DIAGNOSIS — E118 Type 2 diabetes mellitus with unspecified complications: Secondary | ICD-10-CM | POA: Diagnosis not present

## 2019-10-12 DIAGNOSIS — J90 Pleural effusion, not elsewhere classified: Secondary | ICD-10-CM | POA: Diagnosis not present

## 2019-10-12 DIAGNOSIS — R202 Paresthesia of skin: Secondary | ICD-10-CM | POA: Diagnosis not present

## 2019-10-12 DIAGNOSIS — R531 Weakness: Secondary | ICD-10-CM | POA: Diagnosis not present

## 2019-10-12 DIAGNOSIS — I2510 Atherosclerotic heart disease of native coronary artery without angina pectoris: Secondary | ICD-10-CM | POA: Diagnosis not present

## 2019-10-12 DIAGNOSIS — S36113D Laceration of liver, unspecified degree, subsequent encounter: Secondary | ICD-10-CM | POA: Diagnosis not present

## 2019-10-12 DIAGNOSIS — I517 Cardiomegaly: Secondary | ICD-10-CM | POA: Diagnosis not present

## 2019-10-12 LAB — GLUCOSE, CAPILLARY
Glucose-Capillary: 113 mg/dL — ABNORMAL HIGH (ref 70–99)
Glucose-Capillary: 116 mg/dL — ABNORMAL HIGH (ref 70–99)
Glucose-Capillary: 127 mg/dL — ABNORMAL HIGH (ref 70–99)
Glucose-Capillary: 134 mg/dL — ABNORMAL HIGH (ref 70–99)
Glucose-Capillary: 135 mg/dL — ABNORMAL HIGH (ref 70–99)

## 2019-10-12 LAB — COMPREHENSIVE METABOLIC PANEL
ALT: 18 U/L (ref 0–44)
AST: 31 U/L (ref 15–41)
Albumin: 2.5 g/dL — ABNORMAL LOW (ref 3.5–5.0)
Alkaline Phosphatase: 138 U/L — ABNORMAL HIGH (ref 38–126)
Anion gap: 9 (ref 5–15)
BUN: 11 mg/dL (ref 8–23)
CO2: 25 mmol/L (ref 22–32)
Calcium: 8.2 mg/dL — ABNORMAL LOW (ref 8.9–10.3)
Chloride: 100 mmol/L (ref 98–111)
Creatinine, Ser: 0.71 mg/dL (ref 0.61–1.24)
GFR calc Af Amer: >60 mL/min (ref >60)
GFR calc non Af Amer: >60 mL/min (ref >60)
Glucose, Bld: 118 mg/dL — ABNORMAL HIGH (ref 70–99)
Potassium: 3.3 mmol/L — ABNORMAL LOW (ref 3.5–5.1)
Sodium: 134 mmol/L — ABNORMAL LOW (ref 135–145)
Total Bilirubin: 3.7 mg/dL — ABNORMAL HIGH (ref 0.3–1.2)
Total Protein: 6.1 g/dL — ABNORMAL LOW (ref 6.5–8.1)

## 2019-10-12 LAB — CBC
HCT: 32.9 % — ABNORMAL LOW (ref 39.0–52.0)
Hemoglobin: 10 g/dL — ABNORMAL LOW (ref 13.0–17.0)
MCH: 32.3 pg (ref 26.0–34.0)
MCHC: 30.4 g/dL (ref 30.0–36.0)
MCV: 106.1 fL — ABNORMAL HIGH (ref 80.0–100.0)
Platelets: 235 10*3/uL (ref 150–400)
RBC: 3.1 MIL/uL — ABNORMAL LOW (ref 4.22–5.81)
RDW: 22.1 % — ABNORMAL HIGH (ref 11.5–15.5)
WBC: 66.8 10*3/uL (ref 4.0–10.5)
nRBC: 0.2 % (ref 0.0–0.2)

## 2019-10-12 LAB — RESPIRATORY PANEL BY RT PCR (FLU A&B, COVID)
Influenza A by PCR: NEGATIVE
Influenza B by PCR: NEGATIVE
SARS Coronavirus 2 by RT PCR: NEGATIVE

## 2019-10-12 MED ORDER — POLYETHYLENE GLYCOL 3350 17 G PO PACK
17.0000 g | PACK | Freq: Two times a day (BID) | ORAL | Status: DC
  Filled 2019-10-12: qty 1

## 2019-10-12 MED ORDER — POLYETHYLENE GLYCOL 3350 17 G PO PACK: 17.0000 g | PACK | Freq: Two times a day (BID) | ORAL | 0 refills | Status: DC

## 2019-10-12 MED ORDER — POTASSIUM CHLORIDE CRYS ER 20 MEQ PO TBCR
40.0000 meq | EXTENDED_RELEASE_TABLET | Freq: Once | ORAL | Status: AC
  Administered 2019-10-12: 40 meq via ORAL
  Filled 2019-10-12: qty 2

## 2019-10-12 MED ORDER — TAMSULOSIN HCL 0.4 MG PO CAPS: 0.4000 mg | ORAL_CAPSULE | Freq: Every day | ORAL | Status: DC

## 2019-10-12 MED ORDER — DULCOLAX 5 MG PO TBEC: 5.0000 mg | DELAYED_RELEASE_TABLET | Freq: Every day | ORAL | Status: DC | PRN

## 2019-10-12 MED ORDER — DOCUSATE SODIUM 100 MG PO CAPS: 100.0000 mg | ORAL_CAPSULE | Freq: Two times a day (BID) | ORAL | 0 refills | Status: DC

## 2019-10-12 MED ORDER — HEPARIN SODIUM (PORCINE) 5000 UNIT/ML IJ SOLN: 5000.0000 [IU] | Freq: Three times a day (TID) | INTRAMUSCULAR | Status: DC

## 2019-10-12 MED ORDER — SENNOSIDES-DOCUSATE SODIUM 8.6-50 MG PO TABS: 1.0000 | ORAL_TABLET | Freq: Two times a day (BID) | ORAL | Status: DC

## 2019-10-12 MED ORDER — ALBUTEROL SULFATE (2.5 MG/3ML) 0.083% IN NEBU: 3.0000 mL | INHALATION_SOLUTION | Freq: Four times a day (QID) | RESPIRATORY_TRACT | 12 refills | Status: DC | PRN

## 2019-10-12 NOTE — TOC Progression Note (Signed)
Transition of Care [facility]) - Progression Note    Patient Details  Name: Justin [name] MRN: [id number] Date of Birth: 12-Aug-1955  Transition of Care [facility]) CM/SW [place], [place] Phone Number: 10/12/2019, 8:09 AM  Clinical Narrative:    CSW faxed clinicals for insurance auth.  Reference # [id number], Fax # [phone].  TOC Team will continue to assist with disposition planning.   Expected Discharge Plan: [place] Barriers to Discharge: Continued Medical Work up, SNF Pending bed offer  Expected Discharge Plan and Services Expected Discharge Plan: [place] Choice: [place] arrangements for the past 2 months: Single Family Home                                       Social Determinants of Health (SDOH) Interventions    Readmission Risk Interventions No flowsheet data found.

## 2019-10-12 NOTE — Addendum Note (Signed)
Addendum  created 10/12/19 0104 by Nolon Nations, MD   Intraprocedure Event edited, Intraprocedure Staff edited

## 2019-10-12 NOTE — TOC Progression Note (Signed)
Transition of Care [facility]) - Progression Note    Patient Details  Name: Justin [name] MRN: [id number] Date of Birth: December 31, 1955  Transition of Care [facility]) CM/SW [place], [place] Phone Number: 10/12/2019, 11:54 AM  Clinical Narrative:    CSW spoke with pt's spouse concerning SNF placement.  Pt's spouse has accepted SNF placement at [place].  CSW will update [facility].  Pt will need a covid test prior to disposition.  TOC Team will continue to assist for disposition planning.   Expected Discharge Plan: [place] Barriers to Discharge: Continued Medical Work up, SNF Pending bed offer  Expected Discharge Plan and Services Expected Discharge Plan: [place] Choice: [place] arrangements for the past 2 months: Single Family Home                                       Social Determinants of Health (SDOH) Interventions    Readmission Risk Interventions No flowsheet data found.
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
## 2019-10-12 NOTE — TOC Transition Note (Signed)
Transition of Care [facility]) - CM/SW Discharge Note   Patient Details  Name: Justin [name] MRN: [id number] Date of Birth: 02-Apr-1955  Transition of Care [facility]) CM/SW Contact:  Gabrielle Dare Phone Number: 10/12/2019, 1:59 PM   Clinical Narrative:    Patient will Discharge To: [facility] Anticipated DC Date:10/12/19 Family Notified:yes, spouse Reed Dady, [phone] Transport BB:[facility]   Per MD patient ready for DC to [facility] . RN, patient, patient's family, and facility notified of DC. Assessment, Fl2/Pasrr, and Discharge Summary sent to facility. RN given number for report [phone] ask for [facility], Room # 105). DC packet on chart. Ambulance transport requested for patient for 3:00pm  CSW signing off.  Reed Breech LCSWA [phone]     Final next level of care: Skilled Nursing Facility Barriers to Discharge: No Barriers Identified   Patient Goals and CMS Choice Patient states their goals for this hospitalization and ongoing recovery are:: get back to 100% CMS Medicare.gov Compare Post Acute Care list provided to:: Patient Choice offered to / list presented to : Patient  Discharge Placement              Patient chooses bed at:  ([facility]) Patient to be transferred to facility by: [facility] Name of family member notified: Babs Bertin Patient and family notified of of transfer: 10/12/19  Discharge Plan and Services     Post Acute Care Choice: [facility]                               Social Determinants of Health (SDOH) Interventions     Readmission Risk Interventions No flowsheet data found.
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
## 2019-10-12 NOTE — Plan of Care (Signed)
°  Problem: Activity: °Goal: Ability to avoid complications of mobility impairment will improve °Outcome: Progressing °Goal: Ability to tolerate increased activity will improve °Outcome: Progressing °  °Problem: Education: °Goal: Verbalization of understanding the information provided will improve °Outcome: Progressing °  °
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
## 2019-10-17 DIAGNOSIS — S82851D Displaced trimalleolar fracture of right lower leg, subsequent encounter for closed fracture with routine healing: Secondary | ICD-10-CM | POA: Diagnosis not present

## 2019-10-20 DIAGNOSIS — S82851D Displaced trimalleolar fracture of right lower leg, subsequent encounter for closed fracture with routine healing: Secondary | ICD-10-CM | POA: Diagnosis not present

## 2019-10-23 DIAGNOSIS — D649 Anemia, unspecified: Secondary | ICD-10-CM | POA: Diagnosis not present

## 2019-10-23 DIAGNOSIS — S36113D Laceration of liver, unspecified degree, subsequent encounter: Secondary | ICD-10-CM | POA: Diagnosis not present

## 2019-10-23 DIAGNOSIS — I4891 Unspecified atrial fibrillation: Secondary | ICD-10-CM | POA: Diagnosis not present

## 2019-10-23 DIAGNOSIS — U071 COVID-19: Secondary | ICD-10-CM | POA: Diagnosis not present

## 2019-10-26 DIAGNOSIS — I4891 Unspecified atrial fibrillation: Secondary | ICD-10-CM | POA: Diagnosis not present

## 2019-10-26 DIAGNOSIS — U071 COVID-19: Secondary | ICD-10-CM | POA: Diagnosis not present

## 2019-10-26 DIAGNOSIS — R7989 Other specified abnormal findings of blood chemistry: Secondary | ICD-10-CM | POA: Diagnosis not present

## 2019-10-26 DIAGNOSIS — S36113D Laceration of liver, unspecified degree, subsequent encounter: Secondary | ICD-10-CM | POA: Diagnosis not present

## 2019-11-02 DIAGNOSIS — I4891 Unspecified atrial fibrillation: Secondary | ICD-10-CM | POA: Diagnosis not present

## 2019-11-02 DIAGNOSIS — J189 Pneumonia, unspecified organism: Secondary | ICD-10-CM | POA: Diagnosis not present

## 2019-11-02 DIAGNOSIS — S36113D Laceration of liver, unspecified degree, subsequent encounter: Secondary | ICD-10-CM | POA: Diagnosis not present

## 2019-11-02 DIAGNOSIS — U071 COVID-19: Secondary | ICD-10-CM | POA: Diagnosis not present

## 2019-11-06 DIAGNOSIS — D649 Anemia, unspecified: Secondary | ICD-10-CM | POA: Diagnosis not present

## 2019-11-06 DIAGNOSIS — S36113D Laceration of liver, unspecified degree, subsequent encounter: Secondary | ICD-10-CM | POA: Diagnosis not present

## 2019-11-06 DIAGNOSIS — U071 COVID-19: Secondary | ICD-10-CM | POA: Diagnosis not present

## 2019-11-06 DIAGNOSIS — I4891 Unspecified atrial fibrillation: Secondary | ICD-10-CM | POA: Diagnosis not present

## 2019-11-07 DIAGNOSIS — S82851D Displaced trimalleolar fracture of right lower leg, subsequent encounter for closed fracture with routine healing: Secondary | ICD-10-CM | POA: Diagnosis not present

## 2019-11-08 DIAGNOSIS — N319 Neuromuscular dysfunction of bladder, unspecified: Secondary | ICD-10-CM | POA: Diagnosis not present

## 2019-11-08 DIAGNOSIS — I2510 Atherosclerotic heart disease of native coronary artery without angina pectoris: Secondary | ICD-10-CM | POA: Diagnosis not present

## 2019-11-08 DIAGNOSIS — U071 COVID-19: Secondary | ICD-10-CM | POA: Diagnosis not present

## 2019-11-08 DIAGNOSIS — E1169 Type 2 diabetes mellitus with other specified complication: Secondary | ICD-10-CM | POA: Diagnosis not present

## 2019-11-10 ENCOUNTER — Inpatient Hospital Stay: Payer: Medicare Other

## 2019-11-10 ENCOUNTER — Inpatient Hospital Stay: Payer: Medicare Other | Admitting: Family

## 2019-11-11 DIAGNOSIS — D45 Polycythemia vera: Secondary | ICD-10-CM | POA: Diagnosis not present

## 2019-11-11 DIAGNOSIS — D649 Anemia, unspecified: Secondary | ICD-10-CM | POA: Diagnosis not present

## 2019-11-11 DIAGNOSIS — K219 Gastro-esophageal reflux disease without esophagitis: Secondary | ICD-10-CM | POA: Diagnosis not present

## 2019-11-11 DIAGNOSIS — E119 Type 2 diabetes mellitus without complications: Secondary | ICD-10-CM | POA: Diagnosis not present

## 2019-11-11 DIAGNOSIS — G4700 Insomnia, unspecified: Secondary | ICD-10-CM | POA: Diagnosis not present

## 2019-11-11 DIAGNOSIS — S36113D Laceration of liver, unspecified degree, subsequent encounter: Secondary | ICD-10-CM | POA: Diagnosis not present

## 2019-11-11 DIAGNOSIS — N400 Enlarged prostate without lower urinary tract symptoms: Secondary | ICD-10-CM | POA: Diagnosis not present

## 2019-11-11 DIAGNOSIS — J1282 Pneumonia due to coronavirus disease 2019: Secondary | ICD-10-CM | POA: Diagnosis not present

## 2019-11-11 DIAGNOSIS — I110 Hypertensive heart disease with heart failure: Secondary | ICD-10-CM | POA: Diagnosis not present

## 2019-11-11 DIAGNOSIS — M1990 Unspecified osteoarthritis, unspecified site: Secondary | ICD-10-CM | POA: Diagnosis not present

## 2019-11-11 DIAGNOSIS — I480 Paroxysmal atrial fibrillation: Secondary | ICD-10-CM | POA: Diagnosis not present

## 2019-11-11 DIAGNOSIS — I509 Heart failure, unspecified: Secondary | ICD-10-CM | POA: Diagnosis not present

## 2019-11-11 DIAGNOSIS — S271XXD Traumatic hemothorax, subsequent encounter: Secondary | ICD-10-CM | POA: Diagnosis not present

## 2019-11-11 DIAGNOSIS — S82851D Displaced trimalleolar fracture of right lower leg, subsequent encounter for closed fracture with routine healing: Secondary | ICD-10-CM | POA: Diagnosis not present

## 2019-11-11 DIAGNOSIS — I2510 Atherosclerotic heart disease of native coronary artery without angina pectoris: Secondary | ICD-10-CM | POA: Diagnosis not present

## 2019-11-11 DIAGNOSIS — U071 COVID-19: Secondary | ICD-10-CM | POA: Diagnosis not present

## 2019-11-20 ENCOUNTER — Inpatient Hospital Stay: Payer: Medicare Other

## 2019-11-20 ENCOUNTER — Inpatient Hospital Stay: Payer: Medicare Other | Admitting: Family

## 2019-11-29 DIAGNOSIS — D473 Essential (hemorrhagic) thrombocythemia: Secondary | ICD-10-CM | POA: Diagnosis not present

## 2019-11-29 DIAGNOSIS — E785 Hyperlipidemia, unspecified: Secondary | ICD-10-CM | POA: Diagnosis not present

## 2019-11-29 DIAGNOSIS — I10 Essential (primary) hypertension: Secondary | ICD-10-CM | POA: Diagnosis not present

## 2019-11-29 DIAGNOSIS — E119 Type 2 diabetes mellitus without complications: Secondary | ICD-10-CM | POA: Diagnosis not present

## 2019-11-30 DIAGNOSIS — S82851D Displaced trimalleolar fracture of right lower leg, subsequent encounter for closed fracture with routine healing: Secondary | ICD-10-CM | POA: Diagnosis not present

## 2019-12-07 ENCOUNTER — Encounter: Payer: Self-pay | Admitting: Family

## 2019-12-07 ENCOUNTER — Telehealth: Payer: Self-pay

## 2019-12-07 ENCOUNTER — Inpatient Hospital Stay: Payer: Medicare Other | Attending: Hematology & Oncology

## 2019-12-07 ENCOUNTER — Other Ambulatory Visit: Payer: Self-pay

## 2019-12-07 ENCOUNTER — Inpatient Hospital Stay (HOSPITAL_BASED_OUTPATIENT_CLINIC_OR_DEPARTMENT_OTHER): Payer: Medicare Other | Admitting: Family

## 2019-12-07 DIAGNOSIS — S82891D Other fracture of right lower leg, subsequent encounter for closed fracture with routine healing: Secondary | ICD-10-CM | POA: Insufficient documentation

## 2019-12-07 DIAGNOSIS — D75839 Thrombocytosis, unspecified: Secondary | ICD-10-CM | POA: Diagnosis not present

## 2019-12-07 DIAGNOSIS — D72829 Elevated white blood cell count, unspecified: Secondary | ICD-10-CM | POA: Diagnosis not present

## 2019-12-07 DIAGNOSIS — D62 Acute posthemorrhagic anemia: Secondary | ICD-10-CM | POA: Insufficient documentation

## 2019-12-07 DIAGNOSIS — D473 Essential (hemorrhagic) thrombocythemia: Secondary | ICD-10-CM

## 2019-12-07 DIAGNOSIS — R58 Hemorrhage, not elsewhere classified: Secondary | ICD-10-CM | POA: Insufficient documentation

## 2019-12-07 DIAGNOSIS — D45 Polycythemia vera: Secondary | ICD-10-CM

## 2019-12-07 DIAGNOSIS — R202 Paresthesia of skin: Secondary | ICD-10-CM | POA: Insufficient documentation

## 2019-12-07 LAB — CBC WITH DIFFERENTIAL (CANCER CENTER ONLY)
Abs Immature Granulocytes: 4.55 10*3/uL — ABNORMAL HIGH (ref 0.00–0.07)
Basophils Absolute: 0.6 10*3/uL — ABNORMAL HIGH (ref 0.0–0.1)
Basophils Relative: 2 %
Eosinophils Absolute: 0 10*3/uL (ref 0.0–0.5)
Eosinophils Relative: 0 %
HCT: 44.6 % (ref 39.0–52.0)
Hemoglobin: 13.9 g/dL (ref 13.0–17.0)
Immature Granulocytes: 11 %
Lymphocytes Relative: 4 %
Lymphs Abs: 1.5 10*3/uL (ref 0.7–4.0)
MCH: 29.6 pg (ref 26.0–34.0)
MCHC: 31.2 g/dL (ref 30.0–36.0)
MCV: 95.1 fL (ref 80.0–100.0)
Monocytes Absolute: 3.9 10*3/uL — ABNORMAL HIGH (ref 0.1–1.0)
Monocytes Relative: 9 %
Neutro Abs: 30.4 10*3/uL — ABNORMAL HIGH (ref 1.7–7.7)
Neutrophils Relative %: 74 %
Platelet Count: 478 10*3/uL — ABNORMAL HIGH (ref 150–400)
RBC: 4.69 MIL/uL (ref 4.22–5.81)
RDW: 22.1 % — ABNORMAL HIGH (ref 11.5–15.5)
WBC Count: 41 10*3/uL — ABNORMAL HIGH (ref 4.0–10.5)
nRBC: 0.2 % (ref 0.0–0.2)

## 2019-12-07 LAB — CMP (CANCER CENTER ONLY)
ALT: 11 U/L (ref 0–44)
AST: 25 U/L (ref 15–41)
Albumin: 3.2 g/dL — ABNORMAL LOW (ref 3.5–5.0)
Alkaline Phosphatase: 103 U/L (ref 38–126)
Anion gap: 7 (ref 5–15)
BUN: 11 mg/dL (ref 8–23)
CO2: 30 mmol/L (ref 22–32)
Calcium: 9.9 mg/dL (ref 8.9–10.3)
Chloride: 97 mmol/L — ABNORMAL LOW (ref 98–111)
Creatinine: 0.63 mg/dL (ref 0.61–1.24)
GFR, Estimated: >60 mL/min (ref >60)
Glucose, Bld: 111 mg/dL — ABNORMAL HIGH (ref 70–99)
Potassium: 4.7 mmol/L (ref 3.5–5.1)
Sodium: 134 mmol/L — ABNORMAL LOW (ref 135–145)
Total Bilirubin: 1 mg/dL (ref 0.3–1.2)
Total Protein: 7.8 g/dL (ref 6.5–8.1)

## 2019-12-07 LAB — SAVE SMEAR(SSMR), FOR PROVIDER SLIDE REVIEW

## 2019-12-07 LAB — LACTATE DEHYDROGENASE: LDH: 581 U/L — ABNORMAL HIGH (ref 98–192)

## 2019-12-07 MED ORDER — HYDROXYUREA 500 MG PO CAPS: ORAL_CAPSULE | ORAL | 2 refills | Status: DC

## 2019-12-07 NOTE — Telephone Encounter (Signed)
appts made and printed for pt per 11/18 los.... AOM
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
## 2019-12-11 ENCOUNTER — Encounter: Payer: Self-pay | Admitting: Cardiology

## 2019-12-11 ENCOUNTER — Other Ambulatory Visit: Payer: Self-pay

## 2019-12-11 ENCOUNTER — Ambulatory Visit: Payer: Medicare Other | Admitting: Cardiology

## 2019-12-11 VITALS — BP 116/64 | HR 102 | Ht 68.0 in | Wt 178.0 lb

## 2019-12-11 DIAGNOSIS — U071 COVID-19: Secondary | ICD-10-CM | POA: Diagnosis not present

## 2019-12-11 DIAGNOSIS — Z952 Presence of prosthetic heart valve: Secondary | ICD-10-CM | POA: Diagnosis not present

## 2019-12-11 DIAGNOSIS — Z9889 Other specified postprocedural states: Secondary | ICD-10-CM

## 2019-12-11 DIAGNOSIS — Z951 Presence of aortocoronary bypass graft: Secondary | ICD-10-CM

## 2019-12-11 DIAGNOSIS — G4700 Insomnia, unspecified: Secondary | ICD-10-CM | POA: Diagnosis not present

## 2019-12-11 DIAGNOSIS — E119 Type 2 diabetes mellitus without complications: Secondary | ICD-10-CM | POA: Diagnosis not present

## 2019-12-11 DIAGNOSIS — I739 Peripheral vascular disease, unspecified: Secondary | ICD-10-CM

## 2019-12-11 DIAGNOSIS — D45 Polycythemia vera: Secondary | ICD-10-CM | POA: Diagnosis not present

## 2019-12-11 DIAGNOSIS — I10 Essential (primary) hypertension: Secondary | ICD-10-CM

## 2019-12-11 DIAGNOSIS — I110 Hypertensive heart disease with heart failure: Secondary | ICD-10-CM | POA: Diagnosis not present

## 2019-12-11 DIAGNOSIS — I480 Paroxysmal atrial fibrillation: Secondary | ICD-10-CM | POA: Diagnosis not present

## 2019-12-11 DIAGNOSIS — K219 Gastro-esophageal reflux disease without esophagitis: Secondary | ICD-10-CM | POA: Diagnosis not present

## 2019-12-11 DIAGNOSIS — J1282 Pneumonia due to coronavirus disease 2019: Secondary | ICD-10-CM | POA: Diagnosis not present

## 2019-12-11 DIAGNOSIS — S82851D Displaced trimalleolar fracture of right lower leg, subsequent encounter for closed fracture with routine healing: Secondary | ICD-10-CM | POA: Diagnosis not present

## 2019-12-11 DIAGNOSIS — I4821 Permanent atrial fibrillation: Secondary | ICD-10-CM

## 2019-12-11 DIAGNOSIS — I2510 Atherosclerotic heart disease of native coronary artery without angina pectoris: Secondary | ICD-10-CM

## 2019-12-11 DIAGNOSIS — M1990 Unspecified osteoarthritis, unspecified site: Secondary | ICD-10-CM | POA: Diagnosis not present

## 2019-12-11 DIAGNOSIS — I509 Heart failure, unspecified: Secondary | ICD-10-CM | POA: Diagnosis not present

## 2019-12-11 DIAGNOSIS — I2583 Coronary atherosclerosis due to lipid rich plaque: Secondary | ICD-10-CM

## 2019-12-11 DIAGNOSIS — D649 Anemia, unspecified: Secondary | ICD-10-CM | POA: Diagnosis not present

## 2019-12-11 DIAGNOSIS — N400 Enlarged prostate without lower urinary tract symptoms: Secondary | ICD-10-CM | POA: Diagnosis not present

## 2019-12-11 DIAGNOSIS — S271XXD Traumatic hemothorax, subsequent encounter: Secondary | ICD-10-CM | POA: Diagnosis not present

## 2019-12-11 DIAGNOSIS — S36113D Laceration of liver, unspecified degree, subsequent encounter: Secondary | ICD-10-CM | POA: Diagnosis not present

## 2019-12-11 NOTE — Progress Notes (Signed)
Cardiology Office Note:    Date:  12/11/2019   ID:  Justin [name], DOB 04/07/1955, MRN [id number]  PCP:  [name], MD  Cardiologist:  Buford Dresser, MD PhD  Referring MD: [name], MD   CC: follow up  History of Present Illness:    Justin [name] is a 64 y.o. male with a hx of obesity, diabetes, prior tobacco use, hypertension, peripheral vascular disease who is seen in follow up for atrial fibrillation and s/p R CEA and CABG with AVR 01/03/18 for bicuspid aortic valve stenosis. My initial visit with him was on 12/07/17.   Pertinent PMH -atrial fibrillation diagnosed 2019 -R CEA, CABG, AVR (bicuspid valve) 12/2017. -Admission 08/13/2018 for severe anemia 2/2 gastric ulcers -Admission 10/04/2019 for fall, ankle fracture, RP bleed, hemoperitoneum, hemothorax -PAD: 30 mmHg difference between arms, known stenotic disease on dopplers from 12/2017. Noted to have severely dampened flow throughout. L brachial pressures also noted as severely reduced from R arm at the time of dopplers. Remains with claudication, though trying to walk through it. R leg with numbness from vein harvest.  -S/P THA 12/24/2018 -essential thrombocythemia/JAK mutation, followed by Dr. Maylon Peppers, on hydrea  Today: Here with his wife today.   Was doing well until he fell and broke his ankle. Timeline per his recollection: had a hip injection, on arrival home fell getting out of the car. He believes he fell a total of 7 times. Noted to have RP bleed, hemoperitoneum, hemothorax. Hgb went from 15 to 8.   Had Covid symptoms a week after discharge, was on the floor with Covid patients. Went to skilled nursing for PT, developed Covid symptoms. Lost 30 lbs. Lost his taste. He is now back home, monitoring his O2 levels. Trying to exercise when he can.    Past Medical History:  Diagnosis Date  . Acute meniscal tear of knee LEFT  . Anemia   . Aortic stenosis 12/01/2017   NONRHEUMATIC, AORTIC VALVE  CALCIFICATIONS, MILD TO MODERATE REGURG, MILD TO MODERATE CALCIFIED ANNULUS per ECHO 10/25/17 @ [facility]  . Arthritis   . Atrial fibrillation ([place]) 11/12/2017   AT O/V WITH PCP  . Cancer ([place])    skin right arm  . Coronary artery disease   . DM (diabetes mellitus) ([place])   . Heart murmur MILD-- ASYMPTOMATIC  . History of kidney stones   . Hyperlipidemia   . Hypertension   . Left knee pain   . PAD (peripheral artery disease) ([facility])    left leg claudication    Past Surgical History:  Procedure Laterality Date  . AORTIC VALVE REPLACEMENT N/A 01/03/2018   Procedure: AORTIC VALVE REPLACEMENT (AVR) using 11mm Magna Ease Bioprosthesis Aortic Valve;  Surgeon: Ivin Poot, MD;  Location: [place];  Service: Open Heart Surgery;  Laterality: N/A;  . APPENDECTOMY  1998  . BIOPSY  08/11/2018   Procedure: BIOPSY;  Surgeon: Mauri Pole, MD;  Location: [place] [facility];  Service: Endoscopy;;  . CATARACT EXTRACTION W/ INTRAOCULAR LENS  IMPLANT, BILATERAL  1998/  2000  . CERVICAL FUSION  1985   C4 - 5  . CORONARY ARTERY BYPASS GRAFT N/A 01/03/2018   Procedure: CORONARY ARTERY BYPASS GRAFTING (CABG) x 4 (LIMA to LAD, SVG to DIAGONAL, SVG to RAMUS INTERMEDIATE, and SVG to PDA), USING LEFT INFTERNAL MAMMARY ARTERY AND;  Surgeon: Prescott Gum, Collier Salina, MD;  Location: [place];  Service: Open Heart Surgery;  Laterality: N/A;  . ENDARTERECTOMY Right 01/03/2018   Procedure: ENDARTERECTOMY  CAROTID;  Surgeon: Marty Heck, MD;  Location: [facility] OR;  Service: Vascular;  Laterality: Right;  . ESOPHAGOGASTRODUODENOSCOPY (EGD) WITH PROPOFOL N/A 08/11/2018   Procedure: ESOPHAGOGASTRODUODENOSCOPY (EGD) WITH PROPOFOL;  Surgeon: Mauri Pole, MD;  Location: [place];  Service: Endoscopy;  Laterality: N/A;  . KNEE ARTHROSCOPY W/ MENISCECTOMY  1991  1987   LEFT KNEE   x 2  . NASAL SINUS SURGERY  1982  . ORIF ANKLE FRACTURE Right 10/07/2019   Procedure: OPEN REDUCTION INTERNAL FIXATION (ORIF) ANKLE  FRACTURE;  Surgeon: Hiram Gash, MD;  Location: [place];  Service: Orthopedics;  Laterality: Right;  Regional RIGHT  ankle block as well.  Marland Kitchen RIGHT/LEFT HEART CATH AND CORONARY ANGIOGRAPHY N/A 12/24/2017   Procedure: RIGHT/LEFT HEART CATH AND CORONARY ANGIOGRAPHY;  Surgeon: Jolaine Artist, MD;  Location: [place] [facility];  Service: Cardiovascular;  Laterality: N/A;  . ROTATOR CUFF REPAIR  09-04-2005   LEFT SHOULDER  . TEE WITHOUT CARDIOVERSION N/A 12/24/2017   Procedure: TRANSESOPHAGEAL ECHOCARDIOGRAM (TEE);  Surgeon: Jolaine Artist, MD;  Location: [facility];  Service: Cardiovascular;  Laterality: N/A;  . TEE WITHOUT CARDIOVERSION N/A 01/03/2018   Procedure: TRANSESOPHAGEAL ECHOCARDIOGRAM (TEE);  Surgeon: Prescott Gum, Collier Salina, MD;  Location: [place];  Service: Open Heart Surgery;  Laterality: N/A;  . TONSILLECTOMY  AGE 34  . TOTAL HIP ARTHROPLASTY Right 12/23/2018   Procedure: TOTAL HIP ARTHROPLASTY ANTERIOR APPROACH;  Surgeon: Paralee Cancel, MD;  Location: [facility];  Service: Orthopedics;  Laterality: Right;  70 mins    Current Medications: Current Outpatient Medications on File Prior to Visit  Medication Sig  . albuterol (PROVENTIL) (2.5 MG/3ML) 0.083% nebulizer solution Inhale 3 mLs into the lungs every 6 (six) hours as needed for wheezing or shortness of breath.  Marland Kitchen albuterol (VENTOLIN HFA) 108 (90 Base) MCG/ACT inhaler Inhale into the lungs.  . bisacodyl (DULCOLAX) 5 MG EC tablet Take 1 tablet (5 mg total) by mouth daily as needed for moderate constipation.  . Carboxymethylcellulose Sodium (REFRESH TEARS OP) Place 1-2 drops into both eyes daily as needed (dry eyes).  . Cholecalciferol 125 MCG (5000 UT) capsule Take 5,000 Units by mouth daily.   . cyclobenzaprine (FLEXERIL) 10 MG tablet Take 10 mg by mouth 3 (three) times daily.   Marland Kitchen docusate sodium (COLACE) 100 MG capsule Take 1 capsule (100 mg total) by mouth 2 (two) times daily.  Marland Kitchen ELIQUIS 5 MG TABS tablet Take 5 mg by mouth 2 (two)  times daily.  . ferrous sulfate 325 (65 FE) MG tablet Take 1 tablet (325 mg total) by mouth 2 (two) times daily with a meal.  . furosemide (LASIX) 20 MG tablet Take 20 mg by mouth daily as needed for fluid.   Marland Kitchen HYDROcodone-acetaminophen (NORCO) 7.5-325 MG tablet Take 1-2 tablets by mouth every 4 (four) hours as needed for moderate pain.   . hydroxyurea (HYDREA) 500 MG capsule TAKE 1 CAPSULE BY MOUTH 2 TIMES DAILY. MAY TAKE WITH FOOD TO MINIMIZE GI SIDE EFFECTS.  Marland Kitchen Lidocaine (BLUE-EMU PAIN RELIEF DRY EX) Apply 1 application topically daily as needed (pain).  Marland Kitchen loratadine (CLARITIN) 10 MG tablet Take 10 mg by mouth daily.  . metoprolol succinate (TOPROL-XL) 25 MG 24 hr tablet TAKE 1 TABLET BY MOUTH EVERY DAY (Patient taking differently: Take 25 mg by mouth daily. )  . Multiple Vitamins-Minerals (CENTRUM ADULTS) TABS Take 1 capsule by mouth daily.  . pantoprazole (PROTONIX) 40 MG tablet TAKE 1 TABLET BY MOUTH EVERY DAY (Patient  taking differently: Take 40 mg by mouth daily. )  . polyethylene glycol (MIRALAX / GLYCOLAX) 17 g packet Take 17 g by mouth 2 (two) times daily.  . potassium chloride (MICRO-K) 10 MEQ CR capsule Take 10 mEq by mouth daily as needed (take when taking lasix).  . pregabalin (LYRICA) 50 MG capsule Take 50 mg by mouth 3 (three) times daily.  . rosuvastatin (CRESTOR) 20 MG tablet Take 1 tablet (20 mg total) by mouth every evening. (Patient taking differently: Take 20 mg by mouth daily. )  . senna-docusate (SENOKOT-S) 8.6-50 MG tablet Take 1 tablet by mouth 2 (two) times daily.  . tamsulosin (FLOMAX) 0.4 MG CAPS capsule Take 1 capsule (0.4 mg total) by mouth daily.  Marland Kitchen zolpidem (AMBIEN) 10 MG tablet Take 10 mg by mouth at bedtime.    No current facility-administered medications on file prior to visit.    Allergies:   Codeine   Social History   Tobacco Use  . Smoking status: Former Smoker    Packs/day: 0.25    Years: 23.00    Pack years: 5.75    Types: Cigars    Quit date:  01/02/2018    Years since quitting: 1.9  . Smokeless tobacco: Never Used  . Tobacco comment: quit cigs in 2011 and smokes cigars daily- from 3-10 cigars-09/27/13  Vaping Use  . Vaping Use: Never used  Substance Use Topics  . Alcohol use: Not Currently    Alcohol/week: 0.0 standard drinks    Comment: occasional  . Drug use: No    Family History: Family history unknown as he was adopted.  ROS:   Please see the history of present illness.  Additional pertinent ROS otherwise unremarkable.  EKGs/Labs/Other Studies Reviewed:    The following studies were reviewed today:  Echo 08/01/18  1. The left ventricle has normal systolic function with an ejection fraction of 60-65%. The cavity size was normal. There is mildly increased left ventricular wall thickness. Left ventricular diastolic Doppler parameters are indeterminate.  2. The right ventricle has normal systolic function.  3. Left atrial size was moderately dilated.  4. A 55mm an [employer] valve is present in the aortic position. Procedure Date: 01/03/18.  5. Aortic valve prosthesis appears to open well.  Echo 10/25/17 - Left ventricle: Wall thickness was increased in a pattern of mild   LVH. Systolic function was mildly to moderately reduced. The   estimated ejection fraction was in the range of 40% to 45%. - Aortic valve: Mildly to moderately calcified annulus. Mildly   thickened, moderately calcified leaflets. There was mild to   moderate regurgitation. Valve area (VTI): 0.93 cm^2. Valve area   (Vmax): 0.93 cm^2. Valve area (Vmean): 0.9 cm^2. - Left atrium: The atrium was mildly dilated. - Right atrium: The atrium was mildly dilated.  TEE 12/24/17 - Left ventricle: The estimated ejection fraction was 55%. - Aortic valve: Trileaflet; moderately calcified leaflets; fusion   of the left-noncoronary commissure. There was moderate stenosis. - Mitral valve: No evidence of vegetation. There was mild to   moderate  regurgitation. - Left atrium: The atrium was dilated. No evidence of thrombus in   the atrial cavity or appendage. - Right atrium: The atrium was dilated. - Atrial septum: No defect or patent foramen ovale was identified. - Tricuspid valve: No evidence of vegetation. - Pulmonic valve: No evidence of vegetation.  [facility] 12/24/17  Ost RCA to Prox RCA lesion is 95% stenosed.  Prox LAD lesion is 95% stenosed.  Mid RCA lesion is 80% stenosed.  Dist RCA lesion is 99% stenosed.  Post Atrio lesion is 50% stenosed.  Prox Cx to Mid Cx lesion is 80% stenosed.  Mid LM to Dist LM lesion is 50% stenosed.  Ost 1st Diag lesion is 90% stenosed.  There is moderate aortic valve stenosis.   Findings:  RA = 6 RV = 34/5 PA = 34/9 (20) PCW = 21 Fick cardiac output/index = 5.5/2.7 PVR = < 1.0 WU Ao sat = 97% PA sat = 73%, 74%  Assessment: 1. Moderate aortic stenosis 2. Severe 3v CAD 3. Normal LV function 4. Relatively normal hemodynamics Plan/Discussion: Films reviewed with Dr. Prescott Gum. Plan AVR/CABG +/- Maze with Dr. Prescott Gum next week.   Surgery 01/03/18 Procedure (s):  1. Coronary artery bypass grafting x4 (left internal mammary artery to left anterior descending, saphenous vein graft to diagonal, saphenous vein graft to OM1, saphenous vein graft to posterior descending). 2. Aortic valve replacement with a 23 mm Edwards pericardial valve, serial [id number]. 3. Application of left atrial clip, AtriCure 35 mm. 4. Endoscopic harvest of right leg greater saphenous vein by Dr. Prescott Gum on 01/03/2018.  Also R CEA with bovine patch angioplasty  EKG:  EKG is personally reviewed.  The ekg ordered today demonstrates atrial fibrillation at 102 bpm  Recent Labs: 12/07/2019: ALT 11; BUN 11; Creatinine 0.63; Hemoglobin 13.9; Platelet Count 478; Potassium 4.7; Sodium 134  Recent Lipid Panel    Component Value Date/Time   CHOL 127 02/17/2018 1014   TRIG 80 02/17/2018 1014   HDL 41  02/17/2018 1014   CHOLHDL 3.1 02/17/2018 1014   VLDL 16 02/17/2018 1014   LDLCALC 70 02/17/2018 1014    Physical Exam:    VS:  BP 116/64 (BP Location: Left Arm, Patient Position: Sitting, Cuff Size: Normal)   Pulse (!) 102   Ht 5\' 8"  (1.727 m)   Wt 178 lb (80.7 kg)   BMI 27.06 kg/m     Wt Readings from Last 3 Encounters:  12/11/19 178 lb (80.7 kg)  10/05/19 208 lb 12.4 oz (94.7 kg)  08/08/19 199 lb (90.3 kg)    GEN: Well nourished, well developed in no acute distress HEENT: Normal, moist mucous membranes NECK: No JVD CARDIAC: regular rhythm, normal S1 and S2, no rubs or gallops. 2/6 systolic murmur. VASCULAR: Radial and DP pulses 2+ bilaterally. No carotid bruits RESPIRATORY:  Clear to auscultation without rales, wheezing or rhonchi  ABDOMEN: Soft, non-tender, non-distended MUSCULOSKELETAL:  Ambulates independently SKIN: Warm and dry, r ankle booted, left with 1+ ankle edema. NEUROLOGIC:  Alert and oriented x 3. No focal neuro deficits noted. PSYCHIATRIC:  Normal affect   ASSESSMENT:    1. Permanent atrial fibrillation ([place])   2. Coronary artery disease due to lipid rich plaque   3. S/P CABG x 4   4. S/P AVR (aortic valve replacement)   5. History of CEA (carotid endarterectomy)   6. Essential hypertension   7. PAD (peripheral artery disease) ([facility])    PLAN:    S/P R CEA, CAD s/p CABG x4, AVR (for bicuspid AV stenosis) 12/2017:  -continue metoprolol succinate 25 mg daily. -was on 81 mg aspirin for valve and CAD. Has been stopped given bleeds -continue rosuvastatin 20 mg.  -had genital infection on jardiance. Consider GLP1RA for diabetes/ASCVD benefit -has endocarditis prophylaxis antibiotics (amoxicillin) though has dentures -echo with EF 60-65%, valve functioning well  Atrial fibrillation, permanent: goal of rate control -chadsvasc=3, s/p LA clip.  His polycythemia also places him at higher risk of clot as well.  -Continue rate control with metoprolol,  anticoagulation with apixaban  PAD: stable symptoms -no longer smoking. -statin as above -followed by vascular for PAD, carotid disease  Hypertension: at goal, avoid hypotension give PAD pressure differences -continue metoprolol  Hyperlipidemia: goal LDL <70 -continue rosuvastatin 20 mg  Follow up 6 mos or sooner PRN  Medication Adjustments/Labs and Tests Ordered: Current medicines are reviewed at length with the patient today.  Concerns regarding medicines are outlined above.  Orders Placed This Encounter  Procedures  . EKG 12-Lead   No orders of the defined types were placed in this encounter.   Patient Instructions  Medication Instructions:  Your Physician recommend you continue on your current medication as directed.    *If you need a refill on your cardiac medications before your next appointment, please call your pharmacy*   Lab Work: None   Testing/Procedures: None   Follow-Up: At [facility], you and your health needs are our priority.  As part of our continuing mission to provide you with exceptional heart care, we have created designated Provider Care Teams.  These Care Teams include your primary Cardiologist (physician) and Advanced Practice Providers (APPs -  Physician Assistants and Nurse Practitioners) who all work together to provide you with the care you need, when you need it.  We recommend signing up for the patient portal called "MyChart".  Sign up information is provided on this After Visit Summary.  MyChart is used to connect with patients for Virtual Visits (Telemedicine).  Patients are able to view lab/test results, encounter notes, upcoming appointments, etc.  Non-urgent messages can be sent to your provider as well.   To learn more about what you can do with MyChart, go to [email or web address].    Your next appointment:   6 month(s)  The format for your next appointment:   In Person  Provider:   Buford Dresser, MD       Signed, Buford Dresser, MD PhD 12/11/2019    [place]

## 2019-12-11 NOTE — Patient Instructions (Signed)

## 2020-01-04 DIAGNOSIS — S82851D Displaced trimalleolar fracture of right lower leg, subsequent encounter for closed fracture with routine healing: Secondary | ICD-10-CM | POA: Diagnosis not present

## 2020-01-08 DIAGNOSIS — M545 Low back pain, unspecified: Secondary | ICD-10-CM | POA: Diagnosis not present

## 2020-01-16 DIAGNOSIS — M545 Low back pain, unspecified: Secondary | ICD-10-CM | POA: Diagnosis not present

## 2020-01-22 ENCOUNTER — Telehealth: Payer: Self-pay

## 2020-01-22 NOTE — Telephone Encounter (Signed)
   Primary Cardiologist: Jodelle Red, MD  Chart reviewed as part of pre-operative protocol coverage.  Per pharmacy recommendations, patient can hold eliquis 3 days prior to his upcoming colonoscopy with plans to restart when cleared to do so by his surgeon.   I will route this recommendation to the requesting party via Epic fax function and remove from pre-op pool.  Please call with questions.  Beatriz Stallion, PA-C 01/22/2020, 5:31 PM

## 2020-01-22 NOTE — Telephone Encounter (Signed)
   [facility] Pre-operative Risk Assessment    Request for surgical clearance:  1. What type of surgery is being performed? L4-5 ESI  2. When is this surgery scheduled? TBD   3. What type of clearance is required (medical clearance vs. Pharmacy clearance to hold med vs. Both)? PHARMACY  4. Are there any medications that need to be held prior to surgery and how long? ELIQUIS   5. Practice name and name of physician performing surgery? [facility] ATTN:XRAY   6. What is the office phone number? [phone]   7.   What is the office fax number? [phone]  8.   Anesthesia type (None, local, MAC, general) ? NONE

## 2020-01-22 NOTE — Telephone Encounter (Signed)
Patient with diagnosis of afib on Eliquis for anticoagulation.    Procedure: L4-5 ESI Date of procedure: TBD  CHA2DS2-VASc Score = 4  This indicates a 4.8% annual risk of stroke. The patient's score is based upon: CHF History: No HTN History: Yes Diabetes History: Yes Stroke History: No Vascular Disease History: Yes Age Score: 1 Gender Score: 0   CrCl >130mL/min Platelet count 478K  Per office protocol, patient can hold Eliquis for 3 days prior to procedure.

## 2020-01-24 DIAGNOSIS — M4316 Spondylolisthesis, lumbar region: Secondary | ICD-10-CM | POA: Diagnosis not present

## 2020-01-24 DIAGNOSIS — M48062 Spinal stenosis, lumbar region with neurogenic claudication: Secondary | ICD-10-CM | POA: Diagnosis not present

## 2020-01-26 DIAGNOSIS — I350 Nonrheumatic aortic (valve) stenosis: Secondary | ICD-10-CM | POA: Diagnosis not present

## 2020-01-26 DIAGNOSIS — E782 Mixed hyperlipidemia: Secondary | ICD-10-CM | POA: Diagnosis not present

## 2020-01-26 DIAGNOSIS — M545 Low back pain, unspecified: Secondary | ICD-10-CM | POA: Diagnosis not present

## 2020-01-26 DIAGNOSIS — I10 Essential (primary) hypertension: Secondary | ICD-10-CM | POA: Diagnosis not present

## 2020-01-27 ENCOUNTER — Other Ambulatory Visit: Payer: Self-pay | Admitting: Cardiology

## 2020-01-27 DIAGNOSIS — I4819 Other persistent atrial fibrillation: Secondary | ICD-10-CM

## 2020-01-27 DIAGNOSIS — Z951 Presence of aortocoronary bypass graft: Secondary | ICD-10-CM

## 2020-01-29 ENCOUNTER — Telehealth: Payer: Self-pay | Admitting: Cardiology

## 2020-01-29 DIAGNOSIS — M4316 Spondylolisthesis, lumbar region: Secondary | ICD-10-CM | POA: Diagnosis not present

## 2020-01-29 DIAGNOSIS — M48062 Spinal stenosis, lumbar region with neurogenic claudication: Secondary | ICD-10-CM | POA: Diagnosis not present

## 2020-01-29 NOTE — Telephone Encounter (Signed)
*  STAT* If patient is at the pharmacy, call can be transferred to refill team.   1. Which medications need to be refilled? (please list name of each medication and dose if known)Rosuvastatin and  Metoprolol- need new prescriptions - changing pharmacy  2. Which pharmacy/location (including street and city if local pharmacy) is medication to be sent to? [facility]- [address] [place]  3. Do they need a 30 day or 90 day supply? 90 days and refills

## 2020-01-31 NOTE — Telephone Encounter (Signed)
Refills need to go to [facility] and not [facility]

## 2020-02-01 DIAGNOSIS — M4316 Spondylolisthesis, lumbar region: Secondary | ICD-10-CM | POA: Diagnosis not present

## 2020-02-01 DIAGNOSIS — M48062 Spinal stenosis, lumbar region with neurogenic claudication: Secondary | ICD-10-CM | POA: Diagnosis not present

## 2020-02-13 DIAGNOSIS — M5416 Radiculopathy, lumbar region: Secondary | ICD-10-CM | POA: Diagnosis not present

## 2020-02-16 DIAGNOSIS — M4316 Spondylolisthesis, lumbar region: Secondary | ICD-10-CM | POA: Diagnosis not present

## 2020-02-16 DIAGNOSIS — M48062 Spinal stenosis, lumbar region with neurogenic claudication: Secondary | ICD-10-CM | POA: Diagnosis not present

## 2020-02-23 DIAGNOSIS — M48062 Spinal stenosis, lumbar region with neurogenic claudication: Secondary | ICD-10-CM | POA: Diagnosis not present

## 2020-02-23 DIAGNOSIS — M4316 Spondylolisthesis, lumbar region: Secondary | ICD-10-CM | POA: Diagnosis not present

## 2020-03-06 DIAGNOSIS — M4316 Spondylolisthesis, lumbar region: Secondary | ICD-10-CM | POA: Diagnosis not present

## 2020-03-07 ENCOUNTER — Inpatient Hospital Stay (HOSPITAL_BASED_OUTPATIENT_CLINIC_OR_DEPARTMENT_OTHER): Payer: Medicare Other | Admitting: Family

## 2020-03-07 ENCOUNTER — Encounter: Payer: Self-pay | Admitting: Family

## 2020-03-07 ENCOUNTER — Inpatient Hospital Stay: Payer: Medicare Other | Attending: Hematology & Oncology

## 2020-03-07 ENCOUNTER — Telehealth: Payer: Self-pay | Admitting: Family

## 2020-03-07 ENCOUNTER — Other Ambulatory Visit: Payer: Self-pay

## 2020-03-07 VITALS — BP 124/86 | HR 86 | Temp 97.9°F | Resp 18 | Ht 68.0 in | Wt 178.0 lb

## 2020-03-07 DIAGNOSIS — D473 Essential (hemorrhagic) thrombocythemia: Secondary | ICD-10-CM

## 2020-03-07 DIAGNOSIS — Z79899 Other long term (current) drug therapy: Secondary | ICD-10-CM | POA: Insufficient documentation

## 2020-03-07 DIAGNOSIS — D75839 Thrombocytosis, unspecified: Secondary | ICD-10-CM | POA: Insufficient documentation

## 2020-03-07 DIAGNOSIS — D45 Polycythemia vera: Secondary | ICD-10-CM

## 2020-03-07 DIAGNOSIS — R002 Palpitations: Secondary | ICD-10-CM | POA: Insufficient documentation

## 2020-03-07 LAB — CBC WITH DIFFERENTIAL (CANCER CENTER ONLY)
Abs Immature Granulocytes: 1.65 10*3/uL — ABNORMAL HIGH (ref 0.00–0.07)
Basophils Absolute: 0.7 10*3/uL — ABNORMAL HIGH (ref 0.0–0.1)
Basophils Relative: 2 %
Eosinophils Absolute: 0.2 10*3/uL (ref 0.0–0.5)
Eosinophils Relative: 1 %
HCT: 46.7 % (ref 39.0–52.0)
Hemoglobin: 15.4 g/dL (ref 13.0–17.0)
Immature Granulocytes: 5 %
Lymphocytes Relative: 4 %
Lymphs Abs: 1.3 10*3/uL (ref 0.7–4.0)
MCH: 33.7 pg (ref 26.0–34.0)
MCHC: 33 g/dL (ref 30.0–36.0)
MCV: 102.2 fL — ABNORMAL HIGH (ref 80.0–100.0)
Monocytes Absolute: 1.5 10*3/uL — ABNORMAL HIGH (ref 0.1–1.0)
Monocytes Relative: 4 %
Neutro Abs: 30.9 10*3/uL — ABNORMAL HIGH (ref 1.7–7.7)
Neutrophils Relative %: 84 %
Platelet Count: 286 10*3/uL (ref 150–400)
RBC: 4.57 MIL/uL (ref 4.22–5.81)
RDW: 21.1 % — ABNORMAL HIGH (ref 11.5–15.5)
WBC Count: 36.3 10*3/uL — ABNORMAL HIGH (ref 4.0–10.5)
nRBC: 0.1 % (ref 0.0–0.2)

## 2020-03-07 LAB — CMP (CANCER CENTER ONLY)
ALT: 9 U/L (ref 0–44)
AST: 21 U/L (ref 15–41)
Albumin: 4.2 g/dL (ref 3.5–5.0)
Alkaline Phosphatase: 122 U/L (ref 38–126)
Anion gap: 7 (ref 5–15)
BUN: 11 mg/dL (ref 8–23)
CO2: 30 mmol/L (ref 22–32)
Calcium: 10.3 mg/dL (ref 8.9–10.3)
Chloride: 98 mmol/L (ref 98–111)
Creatinine: 0.83 mg/dL (ref 0.61–1.24)
GFR, Estimated: >60 mL/min (ref >60)
Glucose, Bld: 117 mg/dL — ABNORMAL HIGH (ref 70–99)
Potassium: 4.5 mmol/L (ref 3.5–5.1)
Sodium: 135 mmol/L (ref 135–145)
Total Bilirubin: 0.8 mg/dL (ref 0.3–1.2)
Total Protein: 8.2 g/dL — ABNORMAL HIGH (ref 6.5–8.1)

## 2020-03-07 LAB — SAVE SMEAR(SSMR), FOR PROVIDER SLIDE REVIEW

## 2020-03-07 LAB — LACTATE DEHYDROGENASE: LDH: 412 U/L — ABNORMAL HIGH (ref 98–192)

## 2020-03-07 NOTE — Telephone Encounter (Signed)
Appointments scheduled per 2/17 los

## 2020-03-07 NOTE — Progress Notes (Signed)
Hematology and Oncology Follow Up Visit  Justin [name] [id number] 11/25/1955 65 y.o. 03/07/2020   Principle Diagnosis:  Essential thrombocythemia, JAK2 V617 mutation+; high risk (age >67) - Previous patient of Dr. Audelia Hives  Past Work-up: 09/2018: progressive leukocytosis; BM bx results below ? Hypercellular marrow with myeloid hyperplasia and increased atypical megakaryocytes, clusters of megakaryocytes, increased reticulin fibers (MF 1 of 3); no increase in blast population  ? 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 Molecular studies positive for JAK2 V617F (87%) and ASXL1 mutations (42%); no CALR mutation ? No lymphadenopathy or malignancy on CT; splenomegaly (16cm)  Current Therapy: Hydrea 500 mg PO BID   Interim History:  [name] is here today for follow-up. He is doing fairly well. He has chronic back pain and got his most recent steroid injection 2 weeks ago. He verbalized that he is taking his Hydrea 50 mg PO BID as prescribed.  WBC count is 36, Hgb 15.4, MCV 102 and platelets 286.  He has had a couple nose bleeds that he was able to stop within a few minutes by putting a cold ice pack on the back of his neck. No other blood loss noted. No abnormal bruising, no petechiae.  He has has 2 spots on the right ear that he states are squamous cell and dermatology plans to remove.  No fever, chills, n/v, cough, rash, dizziness, SOB< chest pain, abdominal pain or changes in bowel or bladder habits.  He takes a stool softener as needed for constipation.  He has occasional palpitations.  No swelling, tenderness, numbness or tingling in his extremities. Pedal pulses are 2+.  No falls or syncope.  He has maintained a good appetite and is staying well hydrated. His weight is stable.   ECOG Performance Status: 1 - Symptomatic but completely ambulatory  Medications:  Allergies as of 03/07/2020      Reactions   Codeine Swelling   Tolerates hydrocodone, tramadol, and oxycodone     Dilaudid [hydromorphone] Anxiety   Hallucinations       Medication List       Accurate as of March 07, 2020 11:55 AM. If you have any questions, ask your nurse or doctor.        albuterol (2.5 MG/3ML) 0.083% nebulizer solution Commonly known as: PROVENTIL Inhale 3 mLs into the lungs every 6 (six) hours as needed for wheezing or shortness of breath.   albuterol 108 (90 Base) MCG/ACT inhaler Commonly known as: VENTOLIN HFA Inhale into the lungs.   BLUE-EMU PAIN RELIEF DRY EX Apply 1 application topically daily as needed (pain).   Centrum Adults Tabs Take 1 capsule by mouth daily.   Cholecalciferol 125 MCG (5000 UT) capsule Take 5,000 Units by mouth daily.   cyclobenzaprine 10 MG tablet Commonly known as: FLEXERIL Take 10 mg by mouth 3 (three) times daily.   docusate sodium 100 MG capsule Commonly known as: COLACE Take 1 capsule (100 mg total) by mouth 2 (two) times daily.   Dulcolax 5 MG EC tablet Generic drug: bisacodyl Take 1 tablet (5 mg total) by mouth daily as needed for moderate constipation.   Eliquis 5 MG Tabs tablet Generic drug: apixaban Take 5 mg by mouth 2 (two) times daily.   ferrous sulfate 325 (65 FE) MG tablet Take 1 tablet (325 mg total) by mouth 2 (two) times daily with a meal.   furosemide 20 MG tablet Commonly known as: LASIX Take 20 mg by mouth daily as needed for fluid.   HYDROcodone-acetaminophen 7.5-325 MG tablet Commonly  known as: NORCO Take 1-2 tablets by mouth every 4 (four) hours as needed for moderate pain.   hydroxyurea 500 MG capsule Commonly known as: HYDREA TAKE 1 CAPSULE BY MOUTH 2 TIMES DAILY. MAY TAKE WITH FOOD TO MINIMIZE GI SIDE EFFECTS.   loratadine 10 MG tablet Commonly known as: CLARITIN Take 10 mg by mouth daily.   metoprolol succinate 25 MG 24 hr tablet Commonly known as: TOPROL-XL TAKE 1 TABLET BY MOUTH EVERY DAY   pantoprazole 40 MG tablet Commonly known as: PROTONIX TAKE 1 TABLET BY MOUTH EVERY DAY    polyethylene glycol 17 g packet Commonly known as: MIRALAX / GLYCOLAX Take 17 g by mouth 2 (two) times daily.   potassium chloride 10 MEQ CR capsule Commonly known as: MICRO-K Take 10 mEq by mouth daily as needed (take when taking lasix).   pregabalin 50 MG capsule Commonly known as: LYRICA Take 50 mg by mouth 3 (three) times daily.   REFRESH TEARS OP Place 1-2 drops into both eyes daily as needed (dry eyes).   rosuvastatin 20 MG tablet Commonly known as: CRESTOR TAKE 1 TABLET BY MOUTH EVERY DAY IN THE EVENING   senna-docusate 8.6-50 MG tablet Commonly known as: Senokot-S Take 1 tablet by mouth 2 (two) times daily.   tamsulosin 0.4 MG Caps capsule Commonly known as: FLOMAX Take 1 capsule (0.4 mg total) by mouth daily.   zolpidem 10 MG tablet Commonly known as: AMBIEN Take 10 mg by mouth at bedtime.       Allergies:  Allergies  Allergen Reactions  . Codeine Swelling    Tolerates hydrocodone, tramadol, and oxycodone   . Dilaudid [Hydromorphone] Anxiety    Hallucinations     Past Medical History, Surgical history, Social history, and Family History were reviewed and updated.  Review of Systems: All other 10 point review of systems is negative.   Physical Exam:  height is 5\' 8"  (1.727 m) and weight is 178 lb 0.6 oz (80.8 kg). His oral temperature is 97.9 F (36.6 C). His blood pressure is 124/86 and his pulse is 86. His respiration is 18 and oxygen saturation is 100%.   Wt Readings from Last 3 Encounters:  03/07/20 178 lb 0.6 oz (80.8 kg)  12/11/19 178 lb (80.7 kg)  10/05/19 208 lb 12.4 oz (94.7 kg)    Ocular: Sclerae unicteric, pupils equal, round and reactive to light Ear-nose-throat: Oropharynx clear, dentition fair Lymphatic: No cervical or supraclavicular adenopathy Lungs no rales or rhonchi, good excursion bilaterally Heart regular rate and rhythm, no murmur appreciated Abd soft, nontender, positive bowel sounds MSK no focal spinal tenderness, no  joint edema Neuro: non-focal, well-oriented, appropriate affect Breasts: Deferred   Lab Results  Component Value Date   WBC 36.3 (H) 03/07/2020   HGB 15.4 03/07/2020   HCT 46.7 03/07/2020   MCV 102.2 (H) 03/07/2020   PLT 286 03/07/2020   Lab Results  Component Value Date   FERRITIN 121 11/03/2018   IRON 31 (L) 11/03/2018   TIBC 284 11/03/2018   UIBC 253 11/03/2018   IRONPCTSAT 11 (L) 11/03/2018   Lab Results  Component Value Date   RETICCTPCT 5.4 (H) 08/13/2018   RBC 4.57 03/07/2020   No results found for: KPAFRELGTCHN, LAMBDASER, KAPLAMBRATIO No results found for: IGGSERUM, IGA, IGMSERUM No results found for: TOTALPROTELP, ALBUMINELP, A1GS, A2GS, BETS, BETA2SER, GAMS, MSPIKE, SPEI   Chemistry      Component Value Date/Time   NA 135 03/07/2020 1108   K 4.5 03/07/2020 1108  CL 98 03/07/2020 1108   CO2 30 03/07/2020 1108   BUN 11 03/07/2020 1108   CREATININE 0.83 03/07/2020 1108      Component Value Date/Time   CALCIUM 10.3 03/07/2020 1108   ALKPHOS 122 03/07/2020 1108   AST 21 03/07/2020 1108   ALT 9 03/07/2020 1108   BILITOT 0.8 03/07/2020 1108       Impression and Plan:  Mr. Paule is a very pleasant 65 yo caucasian gentleman with essential thrombocythemia, JAK2 V617 mutation +, high risk (age >4).  He continues to do well and is tolerated Hydrea with a nice response.  He will continue his same regimen at 500 mg PO BID.  Follow-up in 3 months.  He can contact our office with any questions or concerns.   Laverna Peace, NP 2/17/202211:55 AM

## 2020-03-13 ENCOUNTER — Other Ambulatory Visit: Payer: Self-pay

## 2020-03-13 DIAGNOSIS — D473 Essential (hemorrhagic) thrombocythemia: Secondary | ICD-10-CM

## 2020-03-13 MED ORDER — HYDROXYUREA 500 MG PO CAPS: ORAL_CAPSULE | ORAL | 2 refills | Status: DC

## 2020-03-27 DIAGNOSIS — C44329 Squamous cell carcinoma of skin of other parts of face: Secondary | ICD-10-CM | POA: Diagnosis not present

## 2020-03-27 DIAGNOSIS — X32XXXD Exposure to sunlight, subsequent encounter: Secondary | ICD-10-CM | POA: Diagnosis not present

## 2020-03-27 DIAGNOSIS — L570 Actinic keratosis: Secondary | ICD-10-CM | POA: Diagnosis not present

## 2020-03-31 ENCOUNTER — Encounter: Payer: Self-pay | Admitting: Cardiology

## 2020-05-14 DIAGNOSIS — Z08 Encounter for follow-up examination after completed treatment for malignant neoplasm: Secondary | ICD-10-CM | POA: Diagnosis not present

## 2020-05-14 DIAGNOSIS — L570 Actinic keratosis: Secondary | ICD-10-CM | POA: Diagnosis not present

## 2020-05-14 DIAGNOSIS — Z85828 Personal history of other malignant neoplasm of skin: Secondary | ICD-10-CM | POA: Diagnosis not present

## 2020-05-14 DIAGNOSIS — C44629 Squamous cell carcinoma of skin of left upper limb, including shoulder: Secondary | ICD-10-CM | POA: Diagnosis not present

## 2020-05-14 DIAGNOSIS — X32XXXD Exposure to sunlight, subsequent encounter: Secondary | ICD-10-CM | POA: Diagnosis not present

## 2020-06-04 ENCOUNTER — Encounter: Payer: Self-pay | Admitting: Hematology & Oncology

## 2020-06-04 ENCOUNTER — Inpatient Hospital Stay (HOSPITAL_BASED_OUTPATIENT_CLINIC_OR_DEPARTMENT_OTHER): Payer: Medicare Other | Admitting: Hematology & Oncology

## 2020-06-04 ENCOUNTER — Inpatient Hospital Stay: Payer: Medicare Other | Attending: Hematology & Oncology

## 2020-06-04 ENCOUNTER — Other Ambulatory Visit: Payer: Self-pay

## 2020-06-04 VITALS — BP 110/52 | HR 73 | Temp 98.2°F | Resp 18 | Wt 179.0 lb

## 2020-06-04 DIAGNOSIS — I2510 Atherosclerotic heart disease of native coronary artery without angina pectoris: Secondary | ICD-10-CM | POA: Diagnosis not present

## 2020-06-04 DIAGNOSIS — D75839 Thrombocytosis, unspecified: Secondary | ICD-10-CM | POA: Insufficient documentation

## 2020-06-04 DIAGNOSIS — Z79899 Other long term (current) drug therapy: Secondary | ICD-10-CM | POA: Diagnosis not present

## 2020-06-04 DIAGNOSIS — D473 Essential (hemorrhagic) thrombocythemia: Secondary | ICD-10-CM | POA: Diagnosis not present

## 2020-06-04 DIAGNOSIS — Z7901 Long term (current) use of anticoagulants: Secondary | ICD-10-CM | POA: Insufficient documentation

## 2020-06-04 DIAGNOSIS — D45 Polycythemia vera: Secondary | ICD-10-CM

## 2020-06-04 LAB — CBC WITH DIFFERENTIAL (CANCER CENTER ONLY)
Abs Immature Granulocytes: 2.2 10*3/uL — ABNORMAL HIGH (ref 0.00–0.07)
Basophils Absolute: 0.7 10*3/uL — ABNORMAL HIGH (ref 0.0–0.1)
Basophils Relative: 2 %
Eosinophils Absolute: 0.2 10*3/uL (ref 0.0–0.5)
Eosinophils Relative: 0 %
HCT: 44.5 % (ref 39.0–52.0)
Hemoglobin: 15.1 g/dL (ref 13.0–17.0)
Immature Granulocytes: 6 %
Lymphocytes Relative: 4 %
Lymphs Abs: 1.5 10*3/uL (ref 0.7–4.0)
MCH: 34.2 pg — ABNORMAL HIGH (ref 26.0–34.0)
MCHC: 33.9 g/dL (ref 30.0–36.0)
MCV: 100.7 fL — ABNORMAL HIGH (ref 80.0–100.0)
Monocytes Absolute: 1.9 10*3/uL — ABNORMAL HIGH (ref 0.1–1.0)
Monocytes Relative: 5 %
Neutro Abs: 30.3 10*3/uL — ABNORMAL HIGH (ref 1.7–7.7)
Neutrophils Relative %: 83 %
Platelet Count: 257 10*3/uL (ref 150–400)
RBC: 4.42 MIL/uL (ref 4.22–5.81)
RDW: 20.2 % — ABNORMAL HIGH (ref 11.5–15.5)
WBC Count: 36.7 10*3/uL — ABNORMAL HIGH (ref 4.0–10.5)
nRBC: 0.2 % (ref 0.0–0.2)

## 2020-06-04 LAB — CMP (CANCER CENTER ONLY)
ALT: 8 U/L (ref 0–44)
AST: 21 U/L (ref 15–41)
Albumin: 3.8 g/dL (ref 3.5–5.0)
Alkaline Phosphatase: 133 U/L — ABNORMAL HIGH (ref 38–126)
Anion gap: 7 (ref 5–15)
BUN: 6 mg/dL — ABNORMAL LOW (ref 8–23)
CO2: 28 mmol/L (ref 22–32)
Calcium: 9.7 mg/dL (ref 8.9–10.3)
Chloride: 99 mmol/L (ref 98–111)
Creatinine: 0.7 mg/dL (ref 0.61–1.24)
GFR, Estimated: >60 mL/min (ref >60)
Glucose, Bld: 111 mg/dL — ABNORMAL HIGH (ref 70–99)
Potassium: 4.6 mmol/L (ref 3.5–5.1)
Sodium: 134 mmol/L — ABNORMAL LOW (ref 135–145)
Total Bilirubin: 0.7 mg/dL (ref 0.3–1.2)
Total Protein: 7 g/dL (ref 6.5–8.1)

## 2020-06-04 LAB — SAVE SMEAR(SSMR), FOR PROVIDER SLIDE REVIEW

## 2020-06-04 LAB — LACTATE DEHYDROGENASE: LDH: 499 U/L — ABNORMAL HIGH (ref 98–192)

## 2020-06-04 NOTE — Progress Notes (Signed)
Hematology and Oncology Follow Up Visit  Justin [name] [id number] [birth year] 65 y.o. 06/04/2020   Principle Diagnosis:  Essential thrombocythemia, JAK2 V617 mutation+; high risk (age >24) - Previous patient of Dr. Audelia [name]  Past Work-up: 09/2018: progressive leukocytosis; BM bx results below ? Hypercellular marrow with myeloid hyperplasia and increased atypical megakaryocytes, clusters of megakaryocytes, increased reticulin fibers (MF 1 of 3); no increase in blast population  ? 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 Molecular studies positive for JAK2 V617F (87%) and ASXL1 mutations (42%); no CALR mutation ? No lymphadenopathy or malignancy on CT; splenomegaly (16cm)  Current Therapy: Hydrea 500 mg PO BID   Interim History:  Justin [name] is here today for follow-up.  This is the first time that I am seeing him.  He was a patient of Dr. Bevelyn [name] has been incredibly interesting to talk to.  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  He is bilingual.  He did a lot of flies over to [place].  He has a [employer] motorcycle.  He likes to ride this.  He does have hip issues.  He is going to need to have hip repair.  He is trying to hold off on this.  He recently had right ankle repair from a fracture.  He is doing well on the Hydrea.  He has had no problems with nausea or vomiting.  He has had no change in bowel or bladder habits.  He has had no fever.  He has had no bleeding.  He has had no rashes.  He does have cardiovascular disease.  He had bypass surgery back in 2019.  He also had carotid artery surgery for the right carotid also in 2019.  He has had no problems with fever.  Overall, I would have to say his performance status is ECOG 1.    Medications:  Allergies as of 06/04/2020      Reactions   Codeine Swelling   Tolerates hydrocodone, tramadol, and oxycodone    Doxycycline Other (See Comments)   Dilaudid [hydromorphone] Anxiety    Hallucinations       Medication List       Accurate as of Jun 04, 2020 11:02 AM. If you have any questions, ask your nurse or doctor.        STOP taking these medications   polyethylene glycol 17 g packet Commonly known as: MIRALAX / GLYCOLAX Stopped by: Volanda Napoleon, MD     TAKE these medications   albuterol (2.5 MG/3ML) 0.083% nebulizer solution Commonly known as: PROVENTIL Inhale 3 mLs into the lungs every 6 (six) hours as needed for wheezing or shortness of breath.   albuterol 108 (90 Base) MCG/ACT inhaler Commonly known as: VENTOLIN HFA Inhale into the lungs.   BLUE-EMU PAIN RELIEF DRY EX Apply 1 application topically daily as needed (pain).   Centrum Adults Tabs Take 1 capsule by mouth daily.   Cholecalciferol 125 MCG (5000 UT) capsule Take 5,000 Units by mouth daily.   cyclobenzaprine 10 MG tablet Commonly known as: FLEXERIL Take 10 mg by mouth 3 (three) times daily.   docusate sodium 100 MG capsule Commonly known as: COLACE Take 1 capsule (100 mg total) by mouth 2 (two) times daily.   Dulcolax 5 MG EC tablet Generic drug: bisacodyl Take 1 tablet (5 mg total) by mouth daily as needed for moderate constipation.   Eliquis 5 MG Tabs tablet Generic drug: apixaban Take 5 mg by mouth 2 (two) times daily.  ferrous sulfate 325 (65 FE) MG tablet Take 1 tablet (325 mg total) by mouth 2 (two) times daily with a meal.   furosemide 20 MG tablet Commonly known as: LASIX Take 20 mg by mouth daily as needed for fluid.   gabapentin 300 MG capsule Commonly known as: NEURONTIN Take 300 mg by mouth 3 (three) times daily.   HYDROcodone-acetaminophen 7.5-325 MG tablet Commonly known as: NORCO Take 1-2 tablets by mouth every 4 (four) hours as needed for moderate pain.   hydroxyurea 500 MG capsule Commonly known as: HYDREA TAKE 1 CAPSULE BY MOUTH 2 TIMES DAILY. MAY TAKE WITH FOOD TO MINIMIZE GI SIDE EFFECTS.   loratadine 10 MG tablet Commonly known as:  CLARITIN Take 10 mg by mouth daily.   metoprolol succinate 25 MG 24 hr tablet Commonly known as: TOPROL-XL TAKE 1 TABLET BY MOUTH EVERY DAY   pantoprazole 40 MG tablet Commonly known as: PROTONIX TAKE 1 TABLET BY MOUTH EVERY DAY   potassium chloride 10 MEQ CR capsule Commonly known as: MICRO-K Take 10 mEq by mouth daily as needed (take when taking lasix).   pregabalin 50 MG capsule Commonly known as: LYRICA Take 50 mg by mouth 3 (three) times daily.   REFRESH TEARS OP Place 1-2 drops into both eyes daily as needed (dry eyes).   rosuvastatin 20 MG tablet Commonly known as: CRESTOR TAKE 1 TABLET BY MOUTH EVERY DAY IN THE EVENING   senna-docusate 8.6-50 MG tablet Commonly known as: Senokot-S Take 1 tablet by mouth 2 (two) times daily.   tamsulosin 0.4 MG Caps capsule Commonly known as: FLOMAX Take 1 capsule (0.4 mg total) by mouth daily.   zolpidem 10 MG tablet Commonly known as: AMBIEN Take 10 mg by mouth at bedtime.       Allergies:  Allergies  Allergen Reactions  . Codeine Swelling    Tolerates hydrocodone, tramadol, and oxycodone   . Doxycycline Other (See Comments)  . Dilaudid [Hydromorphone] Anxiety    Hallucinations     Past Medical History, Surgical history, Social history, and Family History were reviewed and updated.  Review of Systems: Review of Systems  Constitutional: Negative.   HENT: Negative.   Eyes: Negative.   Respiratory: Negative.   Cardiovascular: Negative.   Gastrointestinal: Negative.   Genitourinary: Negative.   Musculoskeletal: Positive for back pain and joint pain.  Skin: Negative.   Neurological: Negative.   Endo/Heme/Allergies: Negative.   Psychiatric/Behavioral: Negative.      Physical Exam:  weight is 179 lb (81.2 kg). His oral temperature is 98.2 F (36.8 C). His blood pressure is 110/52 (abnormal) and his pulse is 73. His respiration is 18 and oxygen saturation is 98%.   Wt Readings from Last 3 Encounters:   06/04/20 179 lb (81.2 kg)  03/07/20 178 lb 0.6 oz (80.8 kg)  12/11/19 178 lb (80.7 kg)    Physical Exam Vitals reviewed.  HENT:     Head: Normocephalic and atraumatic.  Eyes:     Pupils: Pupils are equal, round, and reactive to light.  Cardiovascular:     Rate and Rhythm: Normal rate and regular rhythm.     Heart sounds: Normal heart sounds.  Pulmonary:     Effort: Pulmonary effort is normal.     Breath sounds: Normal breath sounds.  Abdominal:     General: Bowel sounds are normal.     Palpations: Abdomen is soft.  Musculoskeletal:        General: No tenderness or deformity. Normal range of  motion.     Cervical back: Normal range of motion.  Lymphadenopathy:     Cervical: No cervical adenopathy.  Skin:    General: Skin is warm and dry.     Findings: No erythema or rash.  Neurological:     Mental Status: He is alert and oriented to person, place, and time.  Psychiatric:        Behavior: Behavior normal.        Thought Content: Thought content normal.        Judgment: Judgment normal.      Lab Results  Component Value Date   WBC 36.7 (H) 06/04/2020   HGB 15.1 06/04/2020   HCT 44.5 06/04/2020   MCV 100.7 (H) 06/04/2020   PLT 257 06/04/2020   Lab Results  Component Value Date   FERRITIN 121 11/03/2018   IRON 31 (L) 11/03/2018   TIBC 284 11/03/2018   UIBC 253 11/03/2018   IRONPCTSAT 11 (L) 11/03/2018   Lab Results  Component Value Date   RETICCTPCT 5.4 (H) 08/13/2018   RBC 4.42 06/04/2020   No results found for: KPAFRELGTCHN, LAMBDASER, KAPLAMBRATIO No results found for: IGGSERUM, IGA, IGMSERUM No results found for: TOTALPROTELP, ALBUMINELP, A1GS, A2GS, BETS, BETA2SER, GAMS, MSPIKE, SPEI   Chemistry      Component Value Date/Time   NA 134 (L) 06/04/2020 1019   K 4.6 06/04/2020 1019   CL 99 06/04/2020 1019   CO2 28 06/04/2020 1019   BUN 6 (L) 06/04/2020 1019   CREATININE 0.70 06/04/2020 1019      Component Value Date/Time   CALCIUM 9.7 06/04/2020  1019   ALKPHOS 133 (H) 06/04/2020 1019   AST 21 06/04/2020 1019   ALT 8 06/04/2020 1019   BILITOT 0.7 06/04/2020 1019       Impression and Plan:  Mr. Croft is a very pleasant 65 yo caucasian gentleman with essential thrombocythemia, JAK2 V617 mutation +, high risk (age >59).   Is doing okay on the Hydrea.  He still has a high white cell count.  This might be a indicator for thromboembolic disease.  We will have to be careful with this.  I will not make any changes in his Hydrea dose for right now.  We will plan to get him back after the summertime now.  I am sure that he will have a very active summer.  Volanda Napoleon, MD 5/17/202211:02 AM

## 2020-06-04 NOTE — Addendum Note (Signed)
Addended by: Burney Gauze R on: 06/04/2020 11:48 AM   Modules accepted: Orders

## 2020-06-05 NOTE — Progress Notes (Incomplete)
Cardiology Office Note:    Date:  06/05/2020   ID:  Justin [name], DOB [birth year], MRN [id number]  PCP:  [name], MD  Cardiologist:  Buford Dresser, MD PhD  Referring MD: [name], MD   CC: follow up  History of Present Illness:    Justin [name] is a 65 y.o. male with a hx of obesity, diabetes, prior tobacco use, hypertension, peripheral vascular disease who is seen in follow up for atrial fibrillation and s/p R CEA and CABG with AVR 01/03/18 for bicuspid aortic valve stenosis. My initial visit with him was on 12/07/17.   Pertinent PMH -atrial fibrillation diagnosed 2019 -R CEA, CABG, AVR (bicuspid valve) 12/2017. -Admission 08/13/2018 for severe anemia 2/2 gastric ulcers -Admission 10/04/2019 for fall, ankle fracture, RP bleed, hemoperitoneum, hemothorax -PAD: 30 mmHg difference between arms, known stenotic disease on dopplers from 12/2017. Noted to have severely dampened flow throughout. L brachial pressures also noted as severely reduced from R arm at the time of dopplers. Remains with claudication, though trying to walk through it. R leg with numbness from vein harvest.  -S/P THA 12/24/2018 -essential thrombocythemia/JAK mutation, followed by Dr. Maylon Peppers, on hydrea  Prior Visit: Here with his wife today.   Was doing well until he fell and broke his ankle. Timeline per his recollection: had a hip injection, on arrival home fell getting out of the car. He believes he fell a total of 7 times. Noted to have RP bleed, hemoperitoneum, hemothorax. Hgb went from 15 to 8.   Had Covid symptoms a week after discharge, was on the floor with Covid patients. Went to skilled nursing for PT, developed Covid symptoms. Lost 30 lbs. Lost his taste. He is now back home, monitoring his O2 levels. Trying to exercise when he can.   Today:  He denies any chest pain, shortness of breath, palpitations, or exertional symptoms. No headaches, lightheadedness, or syncope to report.  Also has no lower extremity edema, orthopnea or PND.    Past Medical History:  Diagnosis Date   Acute meniscal tear of knee LEFT   Anemia    Aortic stenosis 12/01/2017   NONRHEUMATIC, AORTIC VALVE CALCIFICATIONS, MILD TO MODERATE REGURG, MILD TO MODERATE CALCIFIED ANNULUS per ECHO 10/25/17 @ [facility] [address]   Arthritis    Atrial fibrillation ([place]) 11/12/2017   AT O/V WITH PCP   Cancer ([place])    skin right arm   Coronary artery disease    DM (diabetes mellitus) ([place])    Heart murmur MILD-- ASYMPTOMATIC   History of kidney stones    Hyperlipidemia    Hypertension    Left knee pain    PAD (peripheral artery disease) ([facility])    left leg claudication    Past Surgical History:  Procedure Laterality Date   AORTIC VALVE REPLACEMENT N/A 01/03/2018   Procedure: AORTIC VALVE REPLACEMENT (AVR) using 24mm Magna Ease Bioprosthesis Aortic Valve;  Surgeon: Ivin Poot, MD;  Location: [place];  Service: Open Heart Surgery;  Laterality: N/A;   APPENDECTOMY  1998   BIOPSY  08/11/2018   Procedure: BIOPSY;  Surgeon: Mauri Pole, MD;  Location: [place];  Service: Endoscopy;;   CATARACT EXTRACTION W/ INTRAOCULAR LENS  IMPLANT, BILATERAL  1998/  2000   CERVICAL FUSION  1985   C4 - 5   CORONARY ARTERY BYPASS GRAFT N/A 01/03/2018   Procedure: CORONARY ARTERY BYPASS GRAFTING (CABG) x 4 (LIMA to LAD, SVG to DIAGONAL, SVG to RAMUS INTERMEDIATE, and SVG to PDA),  USING LEFT INFTERNAL MAMMARY ARTERY AND;  Surgeon: Prescott Gum, Collier Salina, MD;  Location: [place];  Service: Open Heart Surgery;  Laterality: N/A;   ENDARTERECTOMY Right 01/03/2018   Procedure: ENDARTERECTOMY CAROTID;  Surgeon: Marty Heck, MD;  Location: [facility] OR;  Service: Vascular;  Laterality: Right;   ESOPHAGOGASTRODUODENOSCOPY (EGD) WITH PROPOFOL N/A 08/11/2018   Procedure: ESOPHAGOGASTRODUODENOSCOPY (EGD) WITH PROPOFOL;  Surgeon: Mauri Pole, MD;  Location: [place];  Service: Endoscopy;   Laterality: N/A;   KNEE ARTHROSCOPY W/ MENISCECTOMY  1991  1987   LEFT KNEE   x 2   NASAL SINUS SURGERY  1982   ORIF ANKLE FRACTURE Right 10/07/2019   Procedure: OPEN REDUCTION INTERNAL FIXATION (ORIF) ANKLE FRACTURE;  Surgeon: Hiram Gash, MD;  Location: [place];  Service: Orthopedics;  Laterality: Right;  Regional RIGHT  ankle block as well.   RIGHT/LEFT HEART CATH AND CORONARY ANGIOGRAPHY N/A 12/24/2017   Procedure: RIGHT/LEFT HEART CATH AND CORONARY ANGIOGRAPHY;  Surgeon: Jolaine Artist, MD;  Location: [facility];  Service: Cardiovascular;  Laterality: N/A;   ROTATOR CUFF REPAIR  09-04-2005   LEFT SHOULDER   TEE WITHOUT CARDIOVERSION N/A 12/24/2017   Procedure: TRANSESOPHAGEAL ECHOCARDIOGRAM (TEE);  Surgeon: Jolaine Artist, MD;  [facility];  Service: Cardiovascular;  Laterality: N/A;   TEE WITHOUT CARDIOVERSION N/A 01/03/2018   Procedure: TRANSESOPHAGEAL ECHOCARDIOGRAM (TEE);  Surgeon: Prescott Gum, Collier Salina, MD;  Location: [place];  Service: Open Heart Surgery;  Laterality: N/A;   TONSILLECTOMY  AGE 77   TOTAL HIP ARTHROPLASTY Right 12/23/2018   Procedure: TOTAL HIP ARTHROPLASTY ANTERIOR APPROACH;  Surgeon: Paralee Cancel, MD;  Location: [facility];  Service: Orthopedics;  Laterality: Right;  70 mins    Current Medications: Current Outpatient Medications on File Prior to Visit  Medication Sig   albuterol (PROVENTIL) (2.5 MG/3ML) 0.083% nebulizer solution Inhale 3 mLs into the lungs every 6 (six) hours as needed for wheezing or shortness of breath. (Patient not taking: Reported on 06/04/2020)   albuterol (VENTOLIN HFA) 108 (90 Base) MCG/ACT inhaler Inhale into the lungs. (Patient not taking: Reported on 06/04/2020)   bisacodyl (DULCOLAX) 5 MG EC tablet Take 1 tablet (5 mg total) by mouth daily as needed for moderate constipation.   Carboxymethylcellulose Sodium (REFRESH TEARS OP) Place 1-2 drops into both eyes daily as needed (dry eyes). (Patient not taking:  Reported on 06/04/2020)   Cholecalciferol 125 MCG (5000 UT) capsule Take 5,000 Units by mouth daily.    cyclobenzaprine (FLEXERIL) 10 MG tablet Take 10 mg by mouth 3 (three) times daily.    docusate sodium (COLACE) 100 MG capsule Take 1 capsule (100 mg total) by mouth 2 (two) times daily.   ELIQUIS 5 MG TABS tablet Take 5 mg by mouth 2 (two) times daily.   ferrous sulfate 325 (65 FE) MG tablet Take 1 tablet (325 mg total) by mouth 2 (two) times daily with a meal.   furosemide (LASIX) 20 MG tablet Take 20 mg by mouth daily as needed for fluid.  (Patient not taking: Reported on 06/04/2020)   gabapentin (NEURONTIN) 300 MG capsule Take 300 mg by mouth 3 (three) times daily.   HYDROcodone-acetaminophen (NORCO) 7.5-325 MG tablet Take 1-2 tablets by mouth every 4 (four) hours as needed for moderate pain.    hydroxyurea (HYDREA) 500 MG capsule TAKE 1 CAPSULE BY MOUTH 2 TIMES DAILY. MAY TAKE WITH FOOD TO MINIMIZE GI SIDE EFFECTS.   Lidocaine (BLUE-EMU PAIN RELIEF DRY EX) Apply 1 application  topically daily as needed (pain). (Patient not taking: Reported on 06/04/2020)   loratadine (CLARITIN) 10 MG tablet Take 10 mg by mouth daily.   metoprolol succinate (TOPROL-XL) 25 MG 24 hr tablet TAKE 1 TABLET BY MOUTH EVERY DAY   Multiple Vitamins-Minerals (CENTRUM ADULTS) TABS Take 1 capsule by mouth daily.   pantoprazole (PROTONIX) 40 MG tablet TAKE 1 TABLET BY MOUTH EVERY DAY (Patient taking differently: Take 40 mg by mouth daily.)   potassium chloride (MICRO-K) 10 MEQ CR capsule Take 10 mEq by mouth daily as needed (take when taking lasix). (Patient not taking: Reported on 06/04/2020)   pregabalin (LYRICA) 50 MG capsule Take 50 mg by mouth 3 (three) times daily.   rosuvastatin (CRESTOR) 20 MG tablet TAKE 1 TABLET BY MOUTH EVERY DAY IN THE EVENING   senna-docusate (SENOKOT-S) 8.6-50 MG tablet Take 1 tablet by mouth 2 (two) times daily.   tamsulosin (FLOMAX) 0.4 MG CAPS capsule Take 1 capsule (0.4 mg  total) by mouth daily.   zolpidem (AMBIEN) 10 MG tablet Take 10 mg by mouth at bedtime.    No current facility-administered medications on file prior to visit.    Allergies:   Codeine, Doxycycline, and Dilaudid [hydromorphone]   Social History   Tobacco Use   Smoking status: Former Smoker    Packs/day: 0.25    Years: 23.00    Pack years: 5.75    Types: Cigars    Quit date: 01/02/2018    Years since quitting: 2.4   Smokeless tobacco: Never Used   Tobacco comment: quit cigs in 2011 and smokes cigars daily- from 3-10 cigars-09/27/13  Vaping Use   Vaping Use: Never used  Substance Use Topics   Alcohol use: Not Currently    Alcohol/week: 0.0 standard drinks    Comment: occasional   Drug use: No    Family History: Family history unknown as he was adopted.  ROS:   Please see the history of present illness.   (+) Additional pertinent ROS otherwise unremarkable.  EKGs/Labs/Other Studies Reviewed:    The following studies were reviewed today:  Echo 08/01/18  1. The left ventricle has normal systolic function with an ejection fraction of 60-65%. The cavity size was normal. There is mildly increased left ventricular wall thickness. Left ventricular diastolic Doppler parameters are indeterminate.  2. The right ventricle has normal systolic function.  3. Left atrial size was moderately dilated.  4. A 80mm an [employer] valve is present in the aortic position. Procedure Date: 01/03/18.  5. Aortic valve prosthesis appears to open well.  Echo 10/25/17 - Left ventricle: Wall thickness was increased in a pattern of mild   LVH. Systolic function was mildly to moderately reduced. The   estimated ejection fraction was in the range of 40% to 45%. - Aortic valve: Mildly to moderately calcified annulus. Mildly   thickened, moderately calcified leaflets. There was mild to   moderate regurgitation. Valve area (VTI): 0.93 cm^2. Valve area   (Vmax): 0.93 cm^2. Valve area (Vmean):  0.9 cm^2. - Left atrium: The atrium was mildly dilated. - Right atrium: The atrium was mildly dilated.  TEE 12/24/17 - Left ventricle: The estimated ejection fraction was 55%. - Aortic valve: Trileaflet; moderately calcified leaflets; fusion   of the left-noncoronary commissure. There was moderate stenosis. - Mitral valve: No evidence of vegetation. There was mild to   moderate regurgitation. - Left atrium: The atrium was dilated. No evidence of thrombus in   the atrial cavity or appendage. -  Right atrium: The atrium was dilated. - Atrial septum: No defect or patent foramen ovale was identified. - Tricuspid valve: No evidence of vegetation. - Pulmonic valve: No evidence of vegetation.  [facility] 12/24/17  Ost RCA to Prox RCA lesion is 95% stenosed.  Prox LAD lesion is 95% stenosed.  Mid RCA lesion is 80% stenosed.  Dist RCA lesion is 99% stenosed.  Post Atrio lesion is 50% stenosed.  Prox Cx to Mid Cx lesion is 80% stenosed.  Mid LM to Dist LM lesion is 50% stenosed.  Ost 1st Diag lesion is 90% stenosed.  There is moderate aortic valve stenosis.   Findings:  RA = 6 RV = 34/5 PA = 34/9 (20) PCW = 21 Fick cardiac output/index = 5.5/2.7 PVR = < 1.0 WU Ao sat = 97% PA sat = 73%, 74%  Assessment: 1. Moderate aortic stenosis 2. Severe 3v CAD 3. Normal LV function 4. Relatively normal hemodynamics Plan/Discussion: Films reviewed with Dr. Prescott Gum. Plan AVR/CABG +/- Maze with Dr. Prescott Gum next week.   Surgery 01/03/18 Procedure (s):  1. Coronary artery bypass grafting x4 (left internal mammary artery to left anterior descending, saphenous vein graft to diagonal, saphenous vein graft to OM1, saphenous vein graft to posterior descending). 2. Aortic valve replacement with a 23 mm Edwards pericardial valve, serial [id number]. 3. Application of left atrial clip, AtriCure 35 mm. 4. Endoscopic harvest of right leg greater saphenous vein by Dr. Prescott Gum on  01/03/2018.  Also R CEA with bovine patch angioplasty  EKG:  EKG is personally reviewed.   06/06/2020: EKG is not ordered today.*** 12/11/2019: atrial fibrillation at 102 bpm  Recent Labs: 06/04/2020: ALT 8; BUN 6; Creatinine 0.70; Hemoglobin 15.1; Platelet Count 257; Potassium 4.6; Sodium 134  Recent Lipid Panel    Component Value Date/Time   CHOL 127 02/17/2018 1014   TRIG 80 02/17/2018 1014   HDL 41 02/17/2018 1014   CHOLHDL 3.1 02/17/2018 1014   VLDL 16 02/17/2018 1014   LDLCALC 70 02/17/2018 1014    Physical Exam:    VS:  There were no vitals taken for this visit.    Wt Readings from Last 3 Encounters:  06/04/20 179 lb (81.2 kg)  03/07/20 178 lb 0.6 oz (80.8 kg)  12/11/19 178 lb (80.7 kg)    GEN: Well nourished, well developed in no acute distress HEENT: Normal, moist mucous membranes NECK: No JVD ***CARDIAC: regular rhythm, normal S1 and S2, no rubs or gallops. 2/6 systolic murmur. VASCULAR: Radial and DP pulses 2+ bilaterally. No carotid bruits RESPIRATORY:  Clear to auscultation without rales, wheezing or rhonchi  ABDOMEN: Soft, non-tender, non-distended MUSCULOSKELETAL:  Ambulates independently ***SKIN: Warm and dry, r ankle booted, left with 1+ ankle edema. NEUROLOGIC:  Alert and oriented x 3. No focal neuro deficits noted. PSYCHIATRIC:  Normal affect   ASSESSMENT:    No diagnosis found. PLAN:    S/P R CEA, CAD s/p CABG x4, AVR (for bicuspid AV stenosis) 12/2017:  -continue metoprolol succinate 25 mg daily. -was on 81 mg aspirin for valve and CAD. Has been stopped given bleeds -continue rosuvastatin 20 mg.  -had genital infection on jardiance. Consider GLP1RA for diabetes/ASCVD benefit -has endocarditis prophylaxis antibiotics (amoxicillin) though has dentures -echo with EF 60-65%, valve functioning well  Atrial fibrillation, permanent: goal of rate control -chadsvasc=3, s/p LA clip. His polycythemia also places him at higher risk of clot as well.   -Continue rate control with metoprolol, anticoagulation with apixaban  PAD: stable symptoms -  no longer smoking. -statin as above -followed by vascular for PAD, carotid disease  Hypertension: at goal, avoid hypotension give PAD pressure differences -continue metoprolol  Hyperlipidemia: goal LDL <70 -continue rosuvastatin 20 mg  Follow up ***6 mos or sooner PRN  Medication Adjustments/Labs and Tests Ordered: Current medicines are reviewed at length with the patient today.  Concerns regarding medicines are outlined above.  No orders of the defined types were placed in this encounter.  No orders of the defined types were placed in this encounter.   There are no Patient Instructions on file for this visit.   I,Mathew Stumpf,acting as a Education administrator for [employer], MD.,have documented all relevant documentation on the behalf of Buford Dresser, MD,as directed by  Buford Dresser, MD while in the presence of Buford Dresser, MD.  ***  Signed, Buford Dresser, MD PhD 06/05/2020    [place]

## 2020-06-06 ENCOUNTER — Ambulatory Visit: Payer: Medicare Other | Admitting: Cardiology

## 2020-06-18 DIAGNOSIS — Z85828 Personal history of other malignant neoplasm of skin: Secondary | ICD-10-CM | POA: Diagnosis not present

## 2020-06-18 DIAGNOSIS — L570 Actinic keratosis: Secondary | ICD-10-CM | POA: Diagnosis not present

## 2020-06-18 DIAGNOSIS — X32XXXD Exposure to sunlight, subsequent encounter: Secondary | ICD-10-CM | POA: Diagnosis not present

## 2020-06-18 DIAGNOSIS — Z08 Encounter for follow-up examination after completed treatment for malignant neoplasm: Secondary | ICD-10-CM | POA: Diagnosis not present

## 2020-06-25 DIAGNOSIS — H5201 Hypermetropia, right eye: Secondary | ICD-10-CM | POA: Diagnosis not present

## 2020-07-03 ENCOUNTER — Other Ambulatory Visit: Payer: Self-pay | Admitting: *Deleted

## 2020-07-03 DIAGNOSIS — I6523 Occlusion and stenosis of bilateral carotid arteries: Secondary | ICD-10-CM

## 2020-07-08 ENCOUNTER — Encounter: Payer: Self-pay | Admitting: Cardiology

## 2020-07-08 ENCOUNTER — Other Ambulatory Visit: Payer: Self-pay

## 2020-07-08 ENCOUNTER — Ambulatory Visit: Payer: Medicare Other | Admitting: Cardiology

## 2020-07-08 VITALS — BP 130/80 | HR 67 | Ht 68.0 in | Wt 179.0 lb

## 2020-07-08 DIAGNOSIS — Z952 Presence of prosthetic heart valve: Secondary | ICD-10-CM | POA: Diagnosis not present

## 2020-07-08 DIAGNOSIS — I4821 Permanent atrial fibrillation: Secondary | ICD-10-CM

## 2020-07-08 DIAGNOSIS — Z951 Presence of aortocoronary bypass graft: Secondary | ICD-10-CM | POA: Diagnosis not present

## 2020-07-08 DIAGNOSIS — I2583 Coronary atherosclerosis due to lipid rich plaque: Secondary | ICD-10-CM

## 2020-07-08 DIAGNOSIS — I2510 Atherosclerotic heart disease of native coronary artery without angina pectoris: Secondary | ICD-10-CM | POA: Diagnosis not present

## 2020-07-08 DIAGNOSIS — Z9889 Other specified postprocedural states: Secondary | ICD-10-CM

## 2020-07-08 DIAGNOSIS — I10 Essential (primary) hypertension: Secondary | ICD-10-CM

## 2020-07-08 DIAGNOSIS — E782 Mixed hyperlipidemia: Secondary | ICD-10-CM | POA: Insufficient documentation

## 2020-07-08 DIAGNOSIS — I739 Peripheral vascular disease, unspecified: Secondary | ICD-10-CM

## 2020-07-08 DIAGNOSIS — I6523 Occlusion and stenosis of bilateral carotid arteries: Secondary | ICD-10-CM

## 2020-07-08 NOTE — Progress Notes (Signed)
Cardiology Office Note:    Date:  07/08/2020   ID:  Justin [name], DOB Mar 02, 1955, MRN [id number]  PCP:  [name], MD  Cardiologist:  Buford Dresser, MD PhD  Referring MD: [name], MD   CC: follow up  History of Present Illness:    Justin [name] is a 65 y.o. male with a hx of obesity, diabetes, prior tobacco use, hypertension, peripheral vascular disease who is seen in follow up for atrial fibrillation and s/p R CEA and CABG with AVR 01/03/18 for bicuspid aortic valve stenosis. My initial visit with him was on 12/07/17.   Pertinent PMH -atrial fibrillation diagnosed 2019 -R CEA, CABG, AVR (bicuspid valve) 12/2017. -Admission 08/13/2018 for severe anemia 2/2 gastric ulcers -Admission 10/04/2019 for fall, ankle fracture, RP bleed, hemoperitoneum, hemothorax -PAD: 30 mmHg difference between arms, known stenotic disease on dopplers from 12/2017. Noted to have severely dampened flow throughout. L brachial pressures also noted as severely reduced from R arm at the time of dopplers. Remains with claudication, though trying to walk through it. R leg with numbness from vein harvest.  -S/P THA 12/24/2018 -essential thrombocythemia/JAK mutation, followed by Dr. Zhao/Dr. Marin Olp, on hydrea  Today: Having squamous cell skin cancer removed over time, being on blood thinner not an issue. No melena or hematochezia. In permanent atrial fibrillation, asymptomatic.   Weight has been stable. Avoids the heat of the day but tries to stay active. Working on [employer], fish at least once a week.  Has had 4th Covid shot without issue. Did have arm soreness/redness with new pneumonia shot. Took a week to return to normal.   Denies chest pain, shortness of breath at rest or with normal exertion. No PND, orthopnea, LE edema or unexpected weight gain. No syncope or palpitations.    Past Medical History:  Diagnosis Date   Acute meniscal tear of knee LEFT   Anemia    Aortic  stenosis 12/01/2017   NONRHEUMATIC, AORTIC VALVE CALCIFICATIONS, MILD TO MODERATE REGURG, MILD TO MODERATE CALCIFIED ANNULUS per ECHO 10/25/17 @ [facility] [address]   Arthritis    Atrial fibrillation ([place]) 11/12/2017   AT O/V WITH PCP   Cancer ([place])    skin right arm   Coronary artery disease    DM (diabetes mellitus) ([place])    Heart murmur MILD-- ASYMPTOMATIC   History of kidney stones    Hyperlipidemia    Hypertension    Left knee pain    PAD (peripheral artery disease) ([facility])    left leg claudication    Past Surgical History:  Procedure Laterality Date   AORTIC VALVE REPLACEMENT N/A 01/03/2018   Procedure: AORTIC VALVE REPLACEMENT (AVR) using 51mm Magna Ease Bioprosthesis Aortic Valve;  Surgeon: Ivin Poot, MD;  Location: [place];  Service: Open Heart Surgery;  Laterality: N/A;   APPENDECTOMY  1998   BIOPSY  08/11/2018   Procedure: BIOPSY;  Surgeon: Mauri Pole, MD;  Location: [place];  Service: Endoscopy;;   CATARACT EXTRACTION W/ INTRAOCULAR LENS  IMPLANT, BILATERAL  1998/  2000   CERVICAL FUSION  1985   C4 - 5   CORONARY ARTERY BYPASS GRAFT N/A 01/03/2018   Procedure: CORONARY ARTERY BYPASS GRAFTING (CABG) x 4 (LIMA to LAD, SVG to DIAGONAL, SVG to RAMUS INTERMEDIATE, and SVG to PDA), USING LEFT INFTERNAL MAMMARY ARTERY AND;  Surgeon: Prescott Gum, Collier Salina, MD;  Location: [facility];  Service: Open Heart Surgery;  Laterality: N/A;   ENDARTERECTOMY Right 01/03/2018   Procedure: ENDARTERECTOMY  CAROTID;  Surgeon: Marty Heck, MD;  Location: [facility] OR;  Service: Vascular;  Laterality: Right;   ESOPHAGOGASTRODUODENOSCOPY (EGD) WITH PROPOFOL N/A 08/11/2018   Procedure: ESOPHAGOGASTRODUODENOSCOPY (EGD) WITH PROPOFOL;  Surgeon: Mauri Pole, MD;  Location: [place];  Service: Endoscopy;  Laterality: N/A;   KNEE ARTHROSCOPY W/ MENISCECTOMY  1991  1987   LEFT KNEE   x 2   NASAL SINUS SURGERY  1982   ORIF ANKLE FRACTURE Right 10/07/2019   Procedure: OPEN REDUCTION  INTERNAL FIXATION (ORIF) ANKLE FRACTURE;  Surgeon: Hiram Gash, MD;  Location: [place];  Service: Orthopedics;  Laterality: Right;  Regional RIGHT  ankle block as well.   RIGHT/LEFT HEART CATH AND CORONARY ANGIOGRAPHY N/A 12/24/2017   Procedure: RIGHT/LEFT HEART CATH AND CORONARY ANGIOGRAPHY;  Surgeon: Jolaine Artist, MD;  Location: [place];  Service: Cardiovascular;  Laterality: N/A;   ROTATOR CUFF REPAIR  09-04-2005   LEFT SHOULDER   TEE WITHOUT CARDIOVERSION N/A 12/24/2017   Procedure: TRANSESOPHAGEAL ECHOCARDIOGRAM (TEE);  Surgeon: Jolaine Artist, MD;  Location: [facility] ENDOSCOPY;  Service: Cardiovascular;  Laterality: N/A;   TEE WITHOUT CARDIOVERSION N/A 01/03/2018   Procedure: TRANSESOPHAGEAL ECHOCARDIOGRAM (TEE);  Surgeon: Prescott Gum, Collier Salina, MD;  Location: [place];  Service: Open Heart Surgery;  Laterality: N/A;   TONSILLECTOMY  AGE 40   TOTAL HIP ARTHROPLASTY Right 12/23/2018   Procedure: TOTAL HIP ARTHROPLASTY ANTERIOR APPROACH;  Surgeon: Paralee Cancel, MD;  Location: [facility];  Service: Orthopedics;  Laterality: Right;  70 mins    Current Medications: Current Outpatient Medications on File Prior to Visit  Medication Sig   albuterol (PROVENTIL) (2.5 MG/3ML) 0.083% nebulizer solution Inhale 3 mLs into the lungs every 6 (six) hours as needed for wheezing or shortness of breath.   albuterol (VENTOLIN HFA) 108 (90 Base) MCG/ACT inhaler Inhale into the lungs.   bisacodyl (DULCOLAX) 5 MG EC tablet Take 1 tablet (5 mg total) by mouth daily as needed for moderate constipation.   Carboxymethylcellulose Sodium (REFRESH TEARS OP) Place 1-2 drops into both eyes daily as needed (dry eyes).   Cholecalciferol 125 MCG (5000 UT) capsule Take 5,000 Units by mouth daily.    cyclobenzaprine (FLEXERIL) 10 MG tablet Take 10 mg by mouth 3 (three) times daily.    docusate sodium (COLACE) 100 MG capsule Take 1 capsule (100 mg total) by mouth 2 (two) times daily.   ELIQUIS 5 MG TABS tablet Take 5 mg by  mouth 2 (two) times daily.   ferrous sulfate 325 (65 FE) MG tablet Take 1 tablet (325 mg total) by mouth 2 (two) times daily with a meal.   furosemide (LASIX) 20 MG tablet Take 20 mg by mouth daily as needed for fluid.   gabapentin (NEURONTIN) 300 MG capsule Take 300 mg by mouth 3 (three) times daily.   HYDROcodone-acetaminophen (NORCO) 7.5-325 MG tablet Take 1-2 tablets by mouth every 4 (four) hours as needed for moderate pain.    hydroxyurea (HYDREA) 500 MG capsule TAKE 1 CAPSULE BY MOUTH 2 TIMES DAILY. MAY TAKE WITH FOOD TO MINIMIZE GI SIDE EFFECTS.   Lidocaine (BLUE-EMU PAIN RELIEF DRY EX) Apply 1 application topically daily as needed (pain).   loratadine (CLARITIN) 10 MG tablet Take 10 mg by mouth daily.   metoprolol succinate (TOPROL-XL) 25 MG 24 hr tablet Take 25 mg by mouth 2 (two) times daily.   Multiple Vitamins-Minerals (CENTRUM ADULTS) TABS Take 1 capsule by mouth daily.   pantoprazole (PROTONIX) 40 MG tablet TAKE  1 TABLET BY MOUTH EVERY DAY (Patient taking differently: Take 40 mg by mouth daily.)   potassium chloride (MICRO-K) 10 MEQ CR capsule Take 10 mEq by mouth daily as needed (take when taking lasix).   pregabalin (LYRICA) 50 MG capsule Take 50 mg by mouth 3 (three) times daily.   rosuvastatin (CRESTOR) 20 MG tablet TAKE 1 TABLET BY MOUTH EVERY DAY IN THE EVENING   senna-docusate (SENOKOT-S) 8.6-50 MG tablet Take 1 tablet by mouth 2 (two) times daily.   tamsulosin (FLOMAX) 0.4 MG CAPS capsule Take 1 capsule (0.4 mg total) by mouth daily.   zolpidem (AMBIEN) 10 MG tablet Take 10 mg by mouth at bedtime.    No current facility-administered medications on file prior to visit.    Allergies:   Codeine, Doxycycline, and Dilaudid [hydromorphone]   Social History   Tobacco Use   Smoking status: Former    Packs/day: 0.25    Years: 23.00    Pack years: 5.75    Types: Cigars, Cigarettes    Quit date: 01/02/2018    Years since quitting: 2.5   Smokeless tobacco: Never   Tobacco  comments:    quit cigs in 2011 and smokes cigars daily- from 3-10 cigars-09/27/13  Vaping Use   Vaping Use: Never used  Substance Use Topics   Alcohol use: Not Currently    Alcohol/week: 0.0 standard drinks    Comment: occasional   Drug use: No    Family History: Family history unknown as he was adopted.  ROS:   Please see the history of present illness.  Additional pertinent ROS otherwise unremarkable.  EKGs/Labs/Other Studies Reviewed:    The following studies were reviewed today:  Echo 08/01/18  1. The left ventricle has normal systolic function with an ejection fraction of 60-65%. The cavity size was normal. There is mildly increased left ventricular wall thickness. Left ventricular diastolic Doppler parameters are indeterminate.  2. The right ventricle has normal systolic function.  3. Left atrial size was moderately dilated.  4. A 74mm an [employer] valve is present in the aortic position. Procedure Date: 01/03/18.  5. Aortic valve prosthesis appears to open well.  Echo 10/25/17 - Left ventricle: Wall thickness was increased in a pattern of mild   LVH. Systolic function was mildly to moderately reduced. The   estimated ejection fraction was in the range of 40% to 45%. - Aortic valve: Mildly to moderately calcified annulus. Mildly   thickened, moderately calcified leaflets. There was mild to   moderate regurgitation. Valve area (VTI): 0.93 cm^2. Valve area   (Vmax): 0.93 cm^2. Valve area (Vmean): 0.9 cm^2. - Left atrium: The atrium was mildly dilated. - Right atrium: The atrium was mildly dilated.  TEE 12/24/17 - Left ventricle: The estimated ejection fraction was 55%. - Aortic valve: Trileaflet; moderately calcified leaflets; fusion   of the left-noncoronary commissure. There was moderate stenosis. - Mitral valve: No evidence of vegetation. There was mild to   moderate regurgitation. - Left atrium: The atrium was dilated. No evidence of thrombus in   the atrial  cavity or appendage. - Right atrium: The atrium was dilated. - Atrial septum: No defect or patent foramen ovale was identified. - Tricuspid valve: No evidence of vegetation. - Pulmonic valve: No evidence of vegetation.  [facility] 12/24/17 Ost RCA to Prox RCA lesion is 95% stenosed. Prox LAD lesion is 95% stenosed. Mid RCA lesion is 80% stenosed. Dist RCA lesion is 99% stenosed. Post Atrio lesion is 50% stenosed. Prox  Cx to Mid Cx lesion is 80% stenosed. Mid LM to Dist LM lesion is 50% stenosed. Ost 1st Diag lesion is 90% stenosed. There is moderate aortic valve stenosis.   Findings:   RA = 6 RV = 34/5 PA = 34/9 (20) PCW = 21 Fick cardiac output/index = 5.5/2.7 PVR = < 1.0 WU Ao sat = 97% PA sat = 73%, 74%   Assessment:  1. Moderate aortic stenosis 2. Severe 3v CAD 3. Normal LV function 4. Relatively normal hemodynamics  Plan/Discussion:  Films reviewed with Dr. Prescott Gum. Plan AVR/CABG +/- Maze with Dr. Prescott Gum next week.   Surgery 01/03/18 Procedure (s):  1.  Coronary artery bypass grafting x4 (left internal mammary artery to left anterior descending, saphenous vein graft to diagonal, saphenous vein graft to OM1, saphenous vein graft to posterior descending). 2.  Aortic valve replacement with a 23 mm Edwards pericardial valve, serial [id number]. 3.  Application of left atrial clip, AtriCure 35 mm. 4.  Endoscopic harvest of right leg greater saphenous vein by Dr. Prescott Gum on 01/03/2018.  Also R CEA with bovine patch angioplasty  EKG:  EKG is personally reviewed.  The ekg ordered 07/08/20 demonstrates atrial fibrillation at 67 bpm  Recent Labs: 06/04/2020: ALT 8; BUN 6; Creatinine 0.70; Hemoglobin 15.1; Platelet Count 257; Potassium 4.6; Sodium 134  Recent Lipid Panel    Component Value Date/Time   CHOL 127 02/17/2018 1014   TRIG 80 02/17/2018 1014   HDL 41 02/17/2018 1014   CHOLHDL 3.1 02/17/2018 1014   VLDL 16 02/17/2018 1014   LDLCALC 70 02/17/2018 1014     Physical Exam:    VS:  BP 130/80 (BP Location: Left Arm)   Pulse 67   Ht 5\' 8"  (1.727 m)   Wt 179 lb (81.2 kg)   BMI 27.22 kg/m     Wt Readings from Last 3 Encounters:  07/08/20 179 lb (81.2 kg)  06/04/20 179 lb (81.2 kg)  03/07/20 178 lb 0.6 oz (80.8 kg)    GEN: Well nourished, well developed in no acute distress HEENT: Normal, moist mucous membranes NECK: No JVD CARDIAC: regular rhythm, normal S1 and S2, no rubs or gallops. 2/6 systolic murmur. VASCULAR: Radial and DP pulses 2+ bilaterally. RESPIRATORY:  Clear to auscultation without rales, wheezing or rhonchi  ABDOMEN: Soft, non-tender, non-distended MUSCULOSKELETAL:  Ambulates independently SKIN: Warm and dry, trivial bilateral LE edema NEUROLOGIC:  Alert and oriented x 3. No focal neuro deficits noted. PSYCHIATRIC:  Normal affect    ASSESSMENT:    1. Permanent atrial fibrillation ([place])   2. Coronary artery disease due to lipid rich plaque   3. S/P CABG x 4   4. S/P AVR (aortic valve replacement)   5. History of CEA (carotid endarterectomy)   6. PAD (peripheral artery disease) ([facility])   7. Carotid stenosis, bilateral   8. Essential hypertension   9. Mixed hyperlipidemia     PLAN:    S/P R CEA, CAD s/p CABG x4, AVR (for bicuspid AV stenosis) 12/2017:  -continue metoprolol succinate 25 mg daily. -was on 81 mg aspirin for valve and CAD. Has been stopped given bleeds -continue rosuvastatin 20 mg.  -had infection on jardiance. Consider GLP1RA for diabetes/ASCVD benefit -has endocarditis prophylaxis antibiotics (amoxicillin) though has dentures -echo with EF 60-65%, valve functioning well  Atrial fibrillation, permanent: goal of rate control -chadsvasc=3, s/p LA clip. His polycythemia also places him at higher risk of clot as well.  -Continue rate control with metoprolol,  anticoagulation with apixaban  PAD: stable symptoms -no longer smoking. -statin as above -followed by vascular for PAD, carotid  disease  Hypertension: at goal, avoid hypotension give PAD pressure differences -continue metoprolol  Hyperlipidemia, mixed: goal LDL <70 -LDL 56, TG 157 07/19/2019 -continue rosuvastatin 20 mg -consider vascepa if TG remain elevated  Follow up 6 mos or sooner PRN  Medication Adjustments/Labs and Tests Ordered: Current medicines are reviewed at length with the patient today.  Concerns regarding medicines are outlined above.  Orders Placed This Encounter  Procedures   EKG 12-Lead    No orders of the defined types were placed in this encounter.   Patient Instructions  Medication Instructions:  Your Physician recommend you continue on your current medication as directed.    *If you need a refill on your cardiac medications before your next appointment, please call your pharmacy*   Lab Work: None ordered today   Testing/Procedures: None ordered today   Follow-Up: At [facility], you and your health needs are our priority.  As part of our continuing mission to provide you with exceptional heart care, we have created designated Provider Care Teams.  These Care Teams include your primary Cardiologist (physician) and Advanced Practice Providers (APPs -  Physician Assistants and Nurse Practitioners) who all work together to provide you with the care you need, when you need it.  We recommend signing up for the patient portal called "MyChart".  Sign up information is provided on this After Visit Summary.  MyChart is used to connect with patients for Virtual Visits (Telemedicine).  Patients are able to view lab/test results, encounter notes, upcoming appointments, etc.  Non-urgent messages can be sent to your provider as well.   To learn more about what you can do with MyChart, go to [email or web address].    Your next appointment:   6 month(s) @ [address] [place], [place] [address]   The format for your next appointment:   In Person  Provider:   Buford Dresser, MD    Signed, Buford Dresser, MD PhD 07/08/2020    [place]

## 2020-07-08 NOTE — Patient Instructions (Signed)
Medication Instructions:  Your Physician recommend you continue on your current medication as directed.    *If you need a refill on your cardiac medications before your next appointment, please call your pharmacy*   Lab Work: None ordered today   Testing/Procedures: None ordered today   Follow-Up: At [facility], you and your health needs are our priority.  As part of our continuing mission to provide you with exceptional heart care, we have created designated Provider Care Teams.  These Care Teams include your primary Cardiologist (physician) and Advanced Practice Providers (APPs -  Physician Assistants and Nurse Practitioners) who all work together to provide you with the care you need, when you need it.  We recommend signing up for the patient portal called "MyChart".  Sign up information is provided on this After Visit Summary.  MyChart is used to connect with patients for Virtual Visits (Telemedicine).  Patients are able to view lab/test results, encounter notes, upcoming appointments, etc.  Non-urgent messages can be sent to your provider as well.   To learn more about what you can do with MyChart, go to [email or web address].    Your next appointment:   6 month(s) @ [address] [place], [place] [address]   The format for your next appointment:   In Person  Provider:   Bridgette Christopher, MD     

## 2020-07-30 DIAGNOSIS — X32XXXD Exposure to sunlight, subsequent encounter: Secondary | ICD-10-CM | POA: Diagnosis not present

## 2020-07-30 DIAGNOSIS — L570 Actinic keratosis: Secondary | ICD-10-CM | POA: Diagnosis not present

## 2020-07-30 DIAGNOSIS — Z08 Encounter for follow-up examination after completed treatment for malignant neoplasm: Secondary | ICD-10-CM | POA: Diagnosis not present

## 2020-07-30 DIAGNOSIS — Z85828 Personal history of other malignant neoplasm of skin: Secondary | ICD-10-CM | POA: Diagnosis not present

## 2020-08-13 ENCOUNTER — Other Ambulatory Visit: Payer: Self-pay

## 2020-08-13 ENCOUNTER — Encounter: Payer: Self-pay | Admitting: Vascular Surgery

## 2020-08-13 ENCOUNTER — Ambulatory Visit: Payer: Medicare Other | Admitting: Vascular Surgery

## 2020-08-13 ENCOUNTER — Ambulatory Visit (HOSPITAL_COMMUNITY)
Admission: RE | Admit: 2020-08-13 | Discharge: 2020-08-13 | Disposition: A | Discharge status: Home / Self Care | Payer: Medicare Other | Source: Ambulatory Visit | Attending: Vascular Surgery | Admitting: Vascular Surgery

## 2020-08-13 VITALS — BP 139/75 | HR 80 | Temp 97.9°F | Resp 18 | Ht 68.0 in | Wt 178.0 lb

## 2020-08-13 DIAGNOSIS — I6523 Occlusion and stenosis of bilateral carotid arteries: Secondary | ICD-10-CM

## 2020-08-13 NOTE — Progress Notes (Signed)
Patient name: Justin [name] MRN: [id number] DOB: [birth year] Sex: male  REASON FOR VISIT: 1 year follow-up after right carotid endarterectomy  HPI: Jauron Freerksen is a 65 y.o. male that presents for 1 year follow-up after right carotid endarterectomy.  Patient was previously under the care of Dr. Prescott Gum and during cardiac work-up was found to have high-grade right ICA stenosis with contralateral occlusion.  Ultimately underwent coronary artery bypass grafting x4 with aortic valve replacement on 01/03/2018 and had a right carotid endarterectomy at the same time by myself.    He presents today for continued surveillance.  He reports no issues over the last 1 year.  Reports no neurologic events.  No vision loss and no unilateral weakness/numbness.  Past Medical History:  Diagnosis Date   Acute meniscal tear of knee LEFT   Anemia    Aortic stenosis 12/01/2017   NONRHEUMATIC, AORTIC VALVE CALCIFICATIONS, MILD TO MODERATE REGURG, MILD TO MODERATE CALCIFIED ANNULUS per ECHO 10/25/17 @ [facility] [address]   Arthritis    Atrial fibrillation ([place]) 11/12/2017   AT O/V WITH PCP   Cancer ([place])    skin right arm   Coronary artery disease    DM (diabetes mellitus) ([place])    Heart murmur MILD-- ASYMPTOMATIC   History of kidney stones    Hyperlipidemia    Hypertension    Left knee pain    PAD (peripheral artery disease) ([facility])    left leg claudication    Past Surgical History:  Procedure Laterality Date   AORTIC VALVE REPLACEMENT N/A 01/03/2018   Procedure: AORTIC VALVE REPLACEMENT (AVR) using 18m Magna Ease Bioprosthesis Aortic Valve;  Surgeon: VIvin Poot MD;  [facility]  Service: Open Heart Surgery;  Laterality: N/A;   APPENDECTOMY  1998   BIOPSY  08/11/2018   Procedure: BIOPSY;  Surgeon: NMauri Pole MD;  [facility]  Service: Endoscopy;;   CATARACT EXTRACTION W/ INTRAOCULAR LENS  IMPLANT, BILATERAL  1998/  2000   CERVICAL FUSION  1985   C4 - 5    CORONARY ARTERY BYPASS GRAFT N/A 01/03/2018   Procedure: CORONARY ARTERY BYPASS GRAFTING (CABG) x 4 (LIMA to LAD, SVG to DIAGONAL, SVG to RAMUS INTERMEDIATE, and SVG to PDA), USING LEFT INFTERNAL MAMMARY ARTERY AND;  Surgeon: VPrescott Gum PCollier Salina MD;  [facility]  Service: Open Heart Surgery;  Laterality: N/A;   ENDARTERECTOMY Right 01/03/2018   Procedure: ENDARTERECTOMY CAROTID;  Surgeon: CMarty Heck MD;  [facility];  Service: Vascular;  Laterality: Right;   ESOPHAGOGASTRODUODENOSCOPY (EGD) WITH PROPOFOL N/A 08/11/2018   Procedure: ESOPHAGOGASTRODUODENOSCOPY (EGD) WITH PROPOFOL;  Surgeon: NMauri Pole MD;  [facility]  Service: Endoscopy;  Laterality: N/A;   KNEE ARTHROSCOPY W/ MENISCECTOMY  1991  1987   LEFT KNEE   x 2   NASAL SINUS SURGERY  1982   ORIF ANKLE FRACTURE Right 10/07/2019   Procedure: OPEN REDUCTION INTERNAL FIXATION (ORIF) ANKLE FRACTURE;  Surgeon: VHiram Gash MD;  [facility]  Service: Orthopedics;  Laterality: Right;  Regional RIGHT  ankle block as well.   RIGHT/LEFT HEART CATH AND CORONARY ANGIOGRAPHY N/A 12/24/2017   Procedure: RIGHT/LEFT HEART CATH AND CORONARY ANGIOGRAPHY;  Surgeon: BJolaine Artist MD;  [facility];  Service: Cardiovascular;  Laterality: N/A;   ROTATOR CUFF REPAIR  09-04-2005   LEFT SHOULDER   TEE WITHOUT CARDIOVERSION N/A 12/24/2017   Procedure: TRANSESOPHAGEAL ECHOCARDIOGRAM (TEE);  Surgeon: BJolaine Artist MD;  [facility];  Service: Cardiovascular;  Laterality: N/A;   TEE WITHOUT CARDIOVERSION N/A 01/03/2018   Procedure: TRANSESOPHAGEAL ECHOCARDIOGRAM (TEE);  Surgeon: Prescott Gum, Collier Salina, MD;  Location: [place];  Service: Open Heart Surgery;  Laterality: N/A;   TONSILLECTOMY  AGE 54   TOTAL HIP ARTHROPLASTY Right 12/23/2018   Procedure: TOTAL HIP ARTHROPLASTY ANTERIOR APPROACH;  Surgeon: Paralee Cancel, MD;  Location: [facility];  Service: Orthopedics;  Laterality: Right;  70 mins     Family History  Adopted: Yes  Problem Relation Age of Onset   Bladder Cancer Father        adopted father    SOCIAL HISTORY: Social History   Tobacco Use   Smoking status: Former    Packs/day: 0.25    Years: 23.00    Pack years: 5.75    Types: Cigars, Cigarettes    Quit date: 01/02/2018    Years since quitting: 2.6   Smokeless tobacco: Never   Tobacco comments:    quit cigs in 2011 and smokes cigars daily- from 3-10 cigars-09/27/13  Substance Use Topics   Alcohol use: Not Currently    Alcohol/week: 0.0 standard drinks    Comment: occasional    Allergies  Allergen Reactions   Codeine Swelling    Tolerates hydrocodone, tramadol, and oxycodone    Doxycycline Other (See Comments)   Dilaudid [Hydromorphone] Anxiety    Hallucinations     Current Outpatient Medications  Medication Sig Dispense Refill   albuterol (PROVENTIL) (2.5 MG/3ML) 0.083% nebulizer solution Inhale 3 mLs into the lungs every 6 (six) hours as needed for wheezing or shortness of breath. 75 mL 12   albuterol (VENTOLIN HFA) 108 (90 Base) MCG/ACT inhaler Inhale into the lungs.     bisacodyl (DULCOLAX) 5 MG EC tablet Take 1 tablet (5 mg total) by mouth daily as needed for moderate constipation.     Carboxymethylcellulose Sodium (REFRESH TEARS OP) Place 1-2 drops into both eyes daily as needed (dry eyes).     Cholecalciferol 125 MCG (5000 UT) capsule Take 5,000 Units by mouth daily.      cyclobenzaprine (FLEXERIL) 10 MG tablet Take 10 mg by mouth 3 (three) times daily.      docusate sodium (COLACE) 100 MG capsule Take 1 capsule (100 mg total) by mouth 2 (two) times daily. 10 capsule 0   ELIQUIS 5 MG TABS tablet Take 5 mg by mouth 2 (two) times daily.     ferrous sulfate 325 (65 FE) MG tablet Take 1 tablet (325 mg total) by mouth 2 (two) times daily with a meal. 60 tablet 0   gabapentin (NEURONTIN) 300 MG capsule Take 300 mg by mouth 3 (three) times daily.     HYDROcodone-acetaminophen (NORCO) 7.5-325 MG  tablet Take 1-2 tablets by mouth every 4 (four) hours as needed for moderate pain.      hydroxyurea (HYDREA) 500 MG capsule TAKE 1 CAPSULE BY MOUTH 2 TIMES DAILY. MAY TAKE WITH FOOD TO MINIMIZE GI SIDE EFFECTS. 180 capsule 2   Lidocaine (BLUE-EMU PAIN RELIEF DRY EX) Apply 1 application topically daily as needed (pain).     loratadine (CLARITIN) 10 MG tablet Take 10 mg by mouth daily.     metoprolol succinate (TOPROL-XL) 25 MG 24 hr tablet Take 25 mg by mouth 2 (two) times daily.     Multiple Vitamins-Minerals (CENTRUM ADULTS) TABS Take 1 capsule by mouth daily.     pantoprazole (PROTONIX) 40 MG tablet TAKE 1 TABLET BY MOUTH EVERY DAY (Patient taking  differently: Take 40 mg by mouth daily.) 90 tablet 3   potassium chloride (MICRO-K) 10 MEQ CR capsule Take 10 mEq by mouth daily as needed (take when taking lasix).     pregabalin (LYRICA) 50 MG capsule Take 50 mg by mouth 3 (three) times daily.     rosuvastatin (CRESTOR) 20 MG tablet TAKE 1 TABLET BY MOUTH EVERY DAY IN THE EVENING 90 tablet 3   senna-docusate (SENOKOT-S) 8.6-50 MG tablet Take 1 tablet by mouth 2 (two) times daily.     tamsulosin (FLOMAX) 0.4 MG CAPS capsule Take 1 capsule (0.4 mg total) by mouth daily. 30 capsule    zolpidem (AMBIEN) 10 MG tablet Take 10 mg by mouth at bedtime.      furosemide (LASIX) 20 MG tablet Take 20 mg by mouth daily as needed for fluid. (Patient not taking: Reported on 08/13/2020)     No current facility-administered medications for this visit.    REVIEW OF SYSTEMS:  '[X]'$  denotes positive finding, '[ ]'$  denotes negative finding Cardiac  Comments:  Chest pain or chest pressure:    Shortness of breath upon exertion:    Short of breath when lying flat:    Irregular heart rhythm:        Vascular    Pain in calf, thigh, or hip brought on by ambulation:    Pain in feet at night that wakes you up from your sleep:     Blood clot in your veins:    Leg swelling:         Pulmonary    Oxygen at home:     Productive cough:     Wheezing:         Neurologic    Sudden weakness in arms or legs:     Sudden numbness in arms or legs:     Sudden onset of difficulty speaking or slurred speech:    Temporary loss of vision in one eye:     Problems with dizziness:         Gastrointestinal    Blood in stool:     Vomited blood:         Genitourinary    Burning when urinating:     Blood in urine:        Psychiatric    Major depression:         Hematologic    Bleeding problems:    Problems with blood clotting too easily:        Skin    Rashes or ulcers:        Constitutional    Fever or chills:      PHYSICAL EXAM: Vitals:   08/13/20 1440 08/13/20 1442  BP: 116/71 139/75  Pulse: 80 80  Resp: 18   Temp: 97.9 F (36.6 C)   TempSrc: Temporal   Weight: 178 lb (80.7 kg)   Height: '5\' 8"'$  (1.727 m)     GENERAL: The patient is a well-nourished male, in no acute distress. The vital signs are documented above. CARDIAC: There is a regular rate and rhythm.  VASCULAR:  Right neck incision well-healed PULMONARY: No respiratory distress. ABDOMEN: Soft and non-tender. MUSCULOSKELETAL: There are no major deformities or cyanosis. NEUROLOGIC: No focal weakness or paresthesias are detected.  Cranial nerves II through XII grossly intact.  DATA:   Reviewed his carotid duplex today and his right carotid endarterectomy appears patent with minimal 1-39% stenosis and on the left there is bidirectional waveforms with no flow noted distally in  the ICA suggesting distal occlusion in the ICA.  Assessment/Plan:  65 year old male presents for 1 year follow-up after right carotid endarterectomy for asymptomatic high-grade disease at the same time as CABG x4/aortic valve replacement on 01/03/18.  The right carotid endarterectomy site continues to look patent with only minimal 1-39% stenosis  On the left we have previously evaluated this with CTA and duplex both suggesting distal ICA occlusion.  We discussed  this again in detail and again discussed I don't think there is any role to surgical revascularization in setting of distal ICA occlusion..  I will see him again in 1 year for ongoing surveillance.   Marty Heck, MD [facility] Office: [phone]

## 2020-09-02 ENCOUNTER — Telehealth: Payer: Self-pay | Admitting: Cardiology

## 2020-09-02 NOTE — Telephone Encounter (Signed)
FYI--patient states he is coming by the [facility] office to drop off long-term disability forms, to be completed by Dr. Harrell Gave.

## 2020-09-10 NOTE — Telephone Encounter (Signed)
Follow Up:    Patient is calling to check on the status of his papers that he dropped off. He said he would like to pick them up today or tomorrow please. He says he need them asap please.

## 2020-09-10 NOTE — Telephone Encounter (Signed)
Pt informed paperwork has been completed. Pt state he will come by office to pick up forms.

## 2020-09-10 NOTE — Telephone Encounter (Signed)
Spoke with pt and let him know that Dr. Harrell Gave and her nurse are out of the office at this time.  He states that is fine.  He was calling to check in because everything has to be turned in by 10/02/20.  Advised I will route her nurse the message and have her follow up once paperwork is reviewed.  Pt appreciative for call.

## 2020-09-13 DIAGNOSIS — Z0000 Encounter for general adult medical examination without abnormal findings: Secondary | ICD-10-CM | POA: Diagnosis not present

## 2020-09-13 DIAGNOSIS — E119 Type 2 diabetes mellitus without complications: Secondary | ICD-10-CM | POA: Diagnosis not present

## 2020-09-13 DIAGNOSIS — E782 Mixed hyperlipidemia: Secondary | ICD-10-CM | POA: Diagnosis not present

## 2020-09-13 DIAGNOSIS — I10 Essential (primary) hypertension: Secondary | ICD-10-CM | POA: Diagnosis not present

## 2020-09-13 DIAGNOSIS — Z8639 Personal history of other endocrine, nutritional and metabolic disease: Secondary | ICD-10-CM | POA: Diagnosis not present

## 2020-09-16 IMAGING — CR DG CHEST 2V
2 series · 2 of 2 positions shown · non-contrast
Comparison: 10/19/2017, 09/27/2013

CLINICAL DATA: CABG history of hypertension

EXAM:
CHEST - 2 VIEW

[w chest pa]
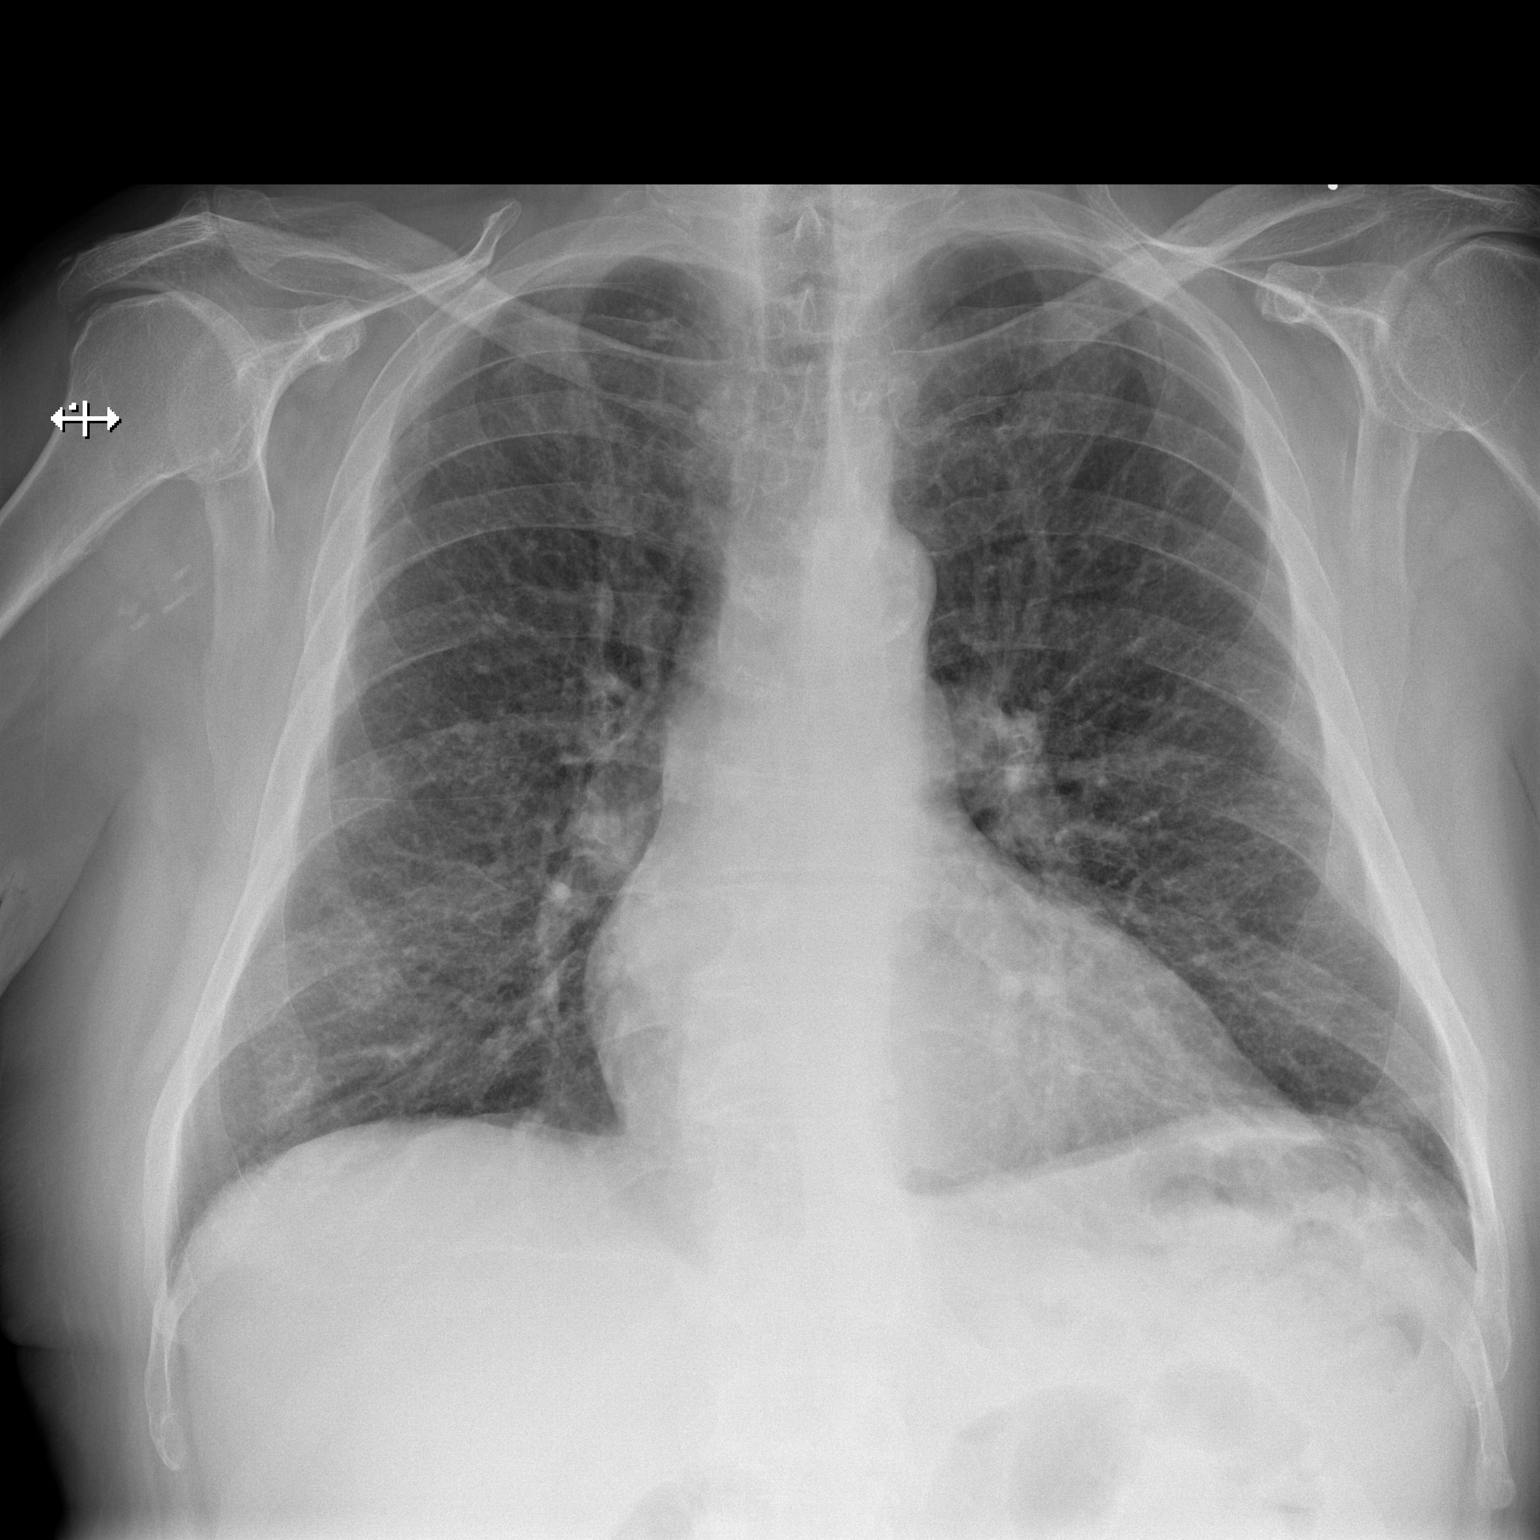

[w chest lat]
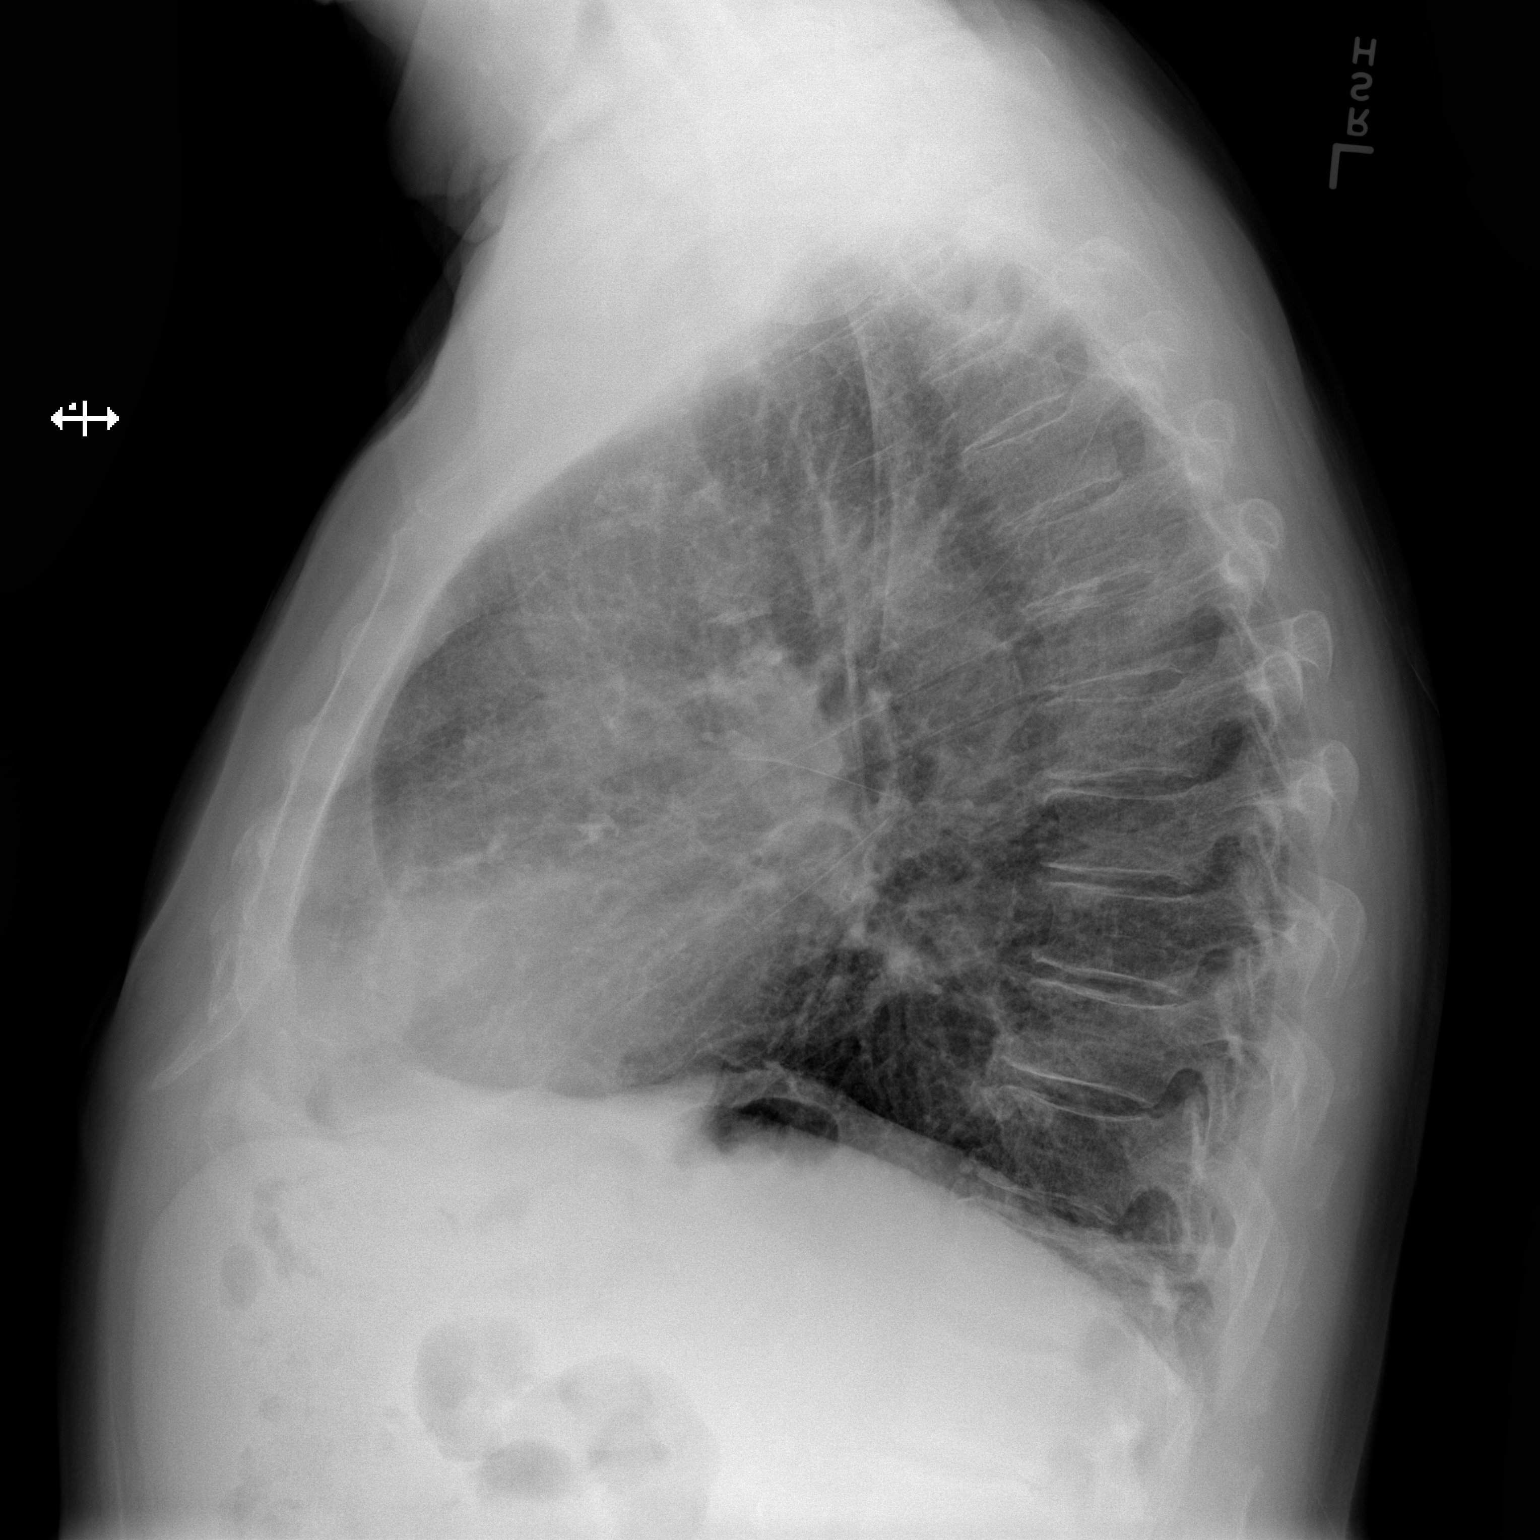

[2 of 2 positions shown; findings below may reference images not displayed]

FINDINGS: Streaky atelectasis at the left base. Mild bronchitic changes. No
pleural effusion. Normal heart size with aortic atherosclerosis. No
pneumothorax.
IMPRESSION: No active cardiopulmonary disease. Mild bronchitic changes and
streaky atelectasis at the left base.

## 2020-09-20 IMAGING — CR DG CHEST 1V PORT
1 series · 1 of 1 positions shown · non-contrast
Comparison: Chest x-ray dated December 30, 2017.

CLINICAL DATA: CABG.

EXAM:
PORTABLE CHEST 1 VIEW

[AP]
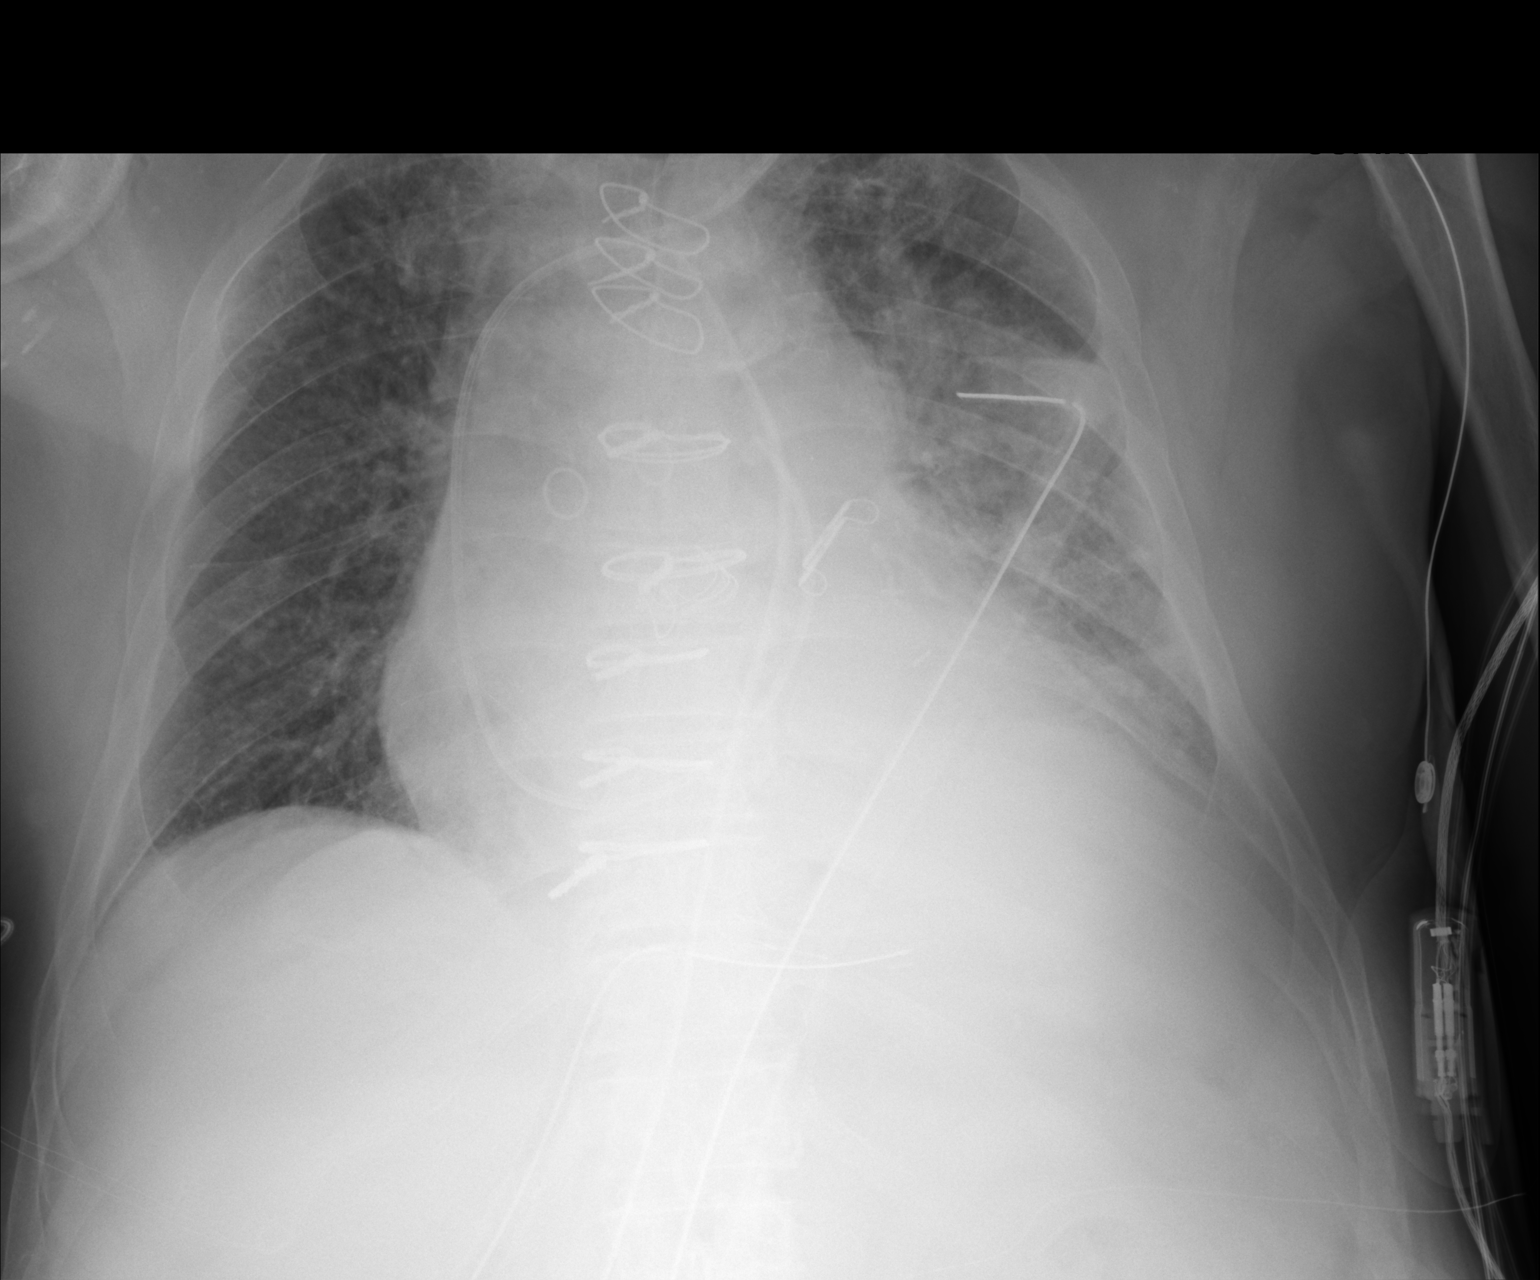

[1 of 1 positions shown; findings below may reference images not displayed]

FINDINGS: Endotracheal tube in position with the tip 4.2 cm above the level of
the carina. Right internal jugular Swan-Ganz catheter with the tip
in the main pulmonary outflow tract. Mediastinal and left chest
tubes are in good position.

Interval CABG, AVR, and left atrial appendage clipping. Small left
pleural effusion with left lower lobe consolidation/atelectasis.
Patchy increased density in the left mid lung may reflect asymmetric
pulmonary edema. The right lung is clear. No pneumothorax. No acute
osseous abnormality.
IMPRESSION: 1. Lines and tubes as above.  No pneumothorax.
2. Small left pleural effusion and asymmetric left-sided pulmonary
edema. Left lower lobe consolidation/atelectasis.

## 2020-09-20 IMAGING — DX DG CHEST 1V PORT
1 series · 1 of 1 positions shown · non-contrast
Comparison: Chest radiograph 01/03/2018 at [DATE]

CLINICAL DATA: Status post aortic valve replacement

EXAM:
PORTABLE CHEST 1 VIEW

[chest ap]
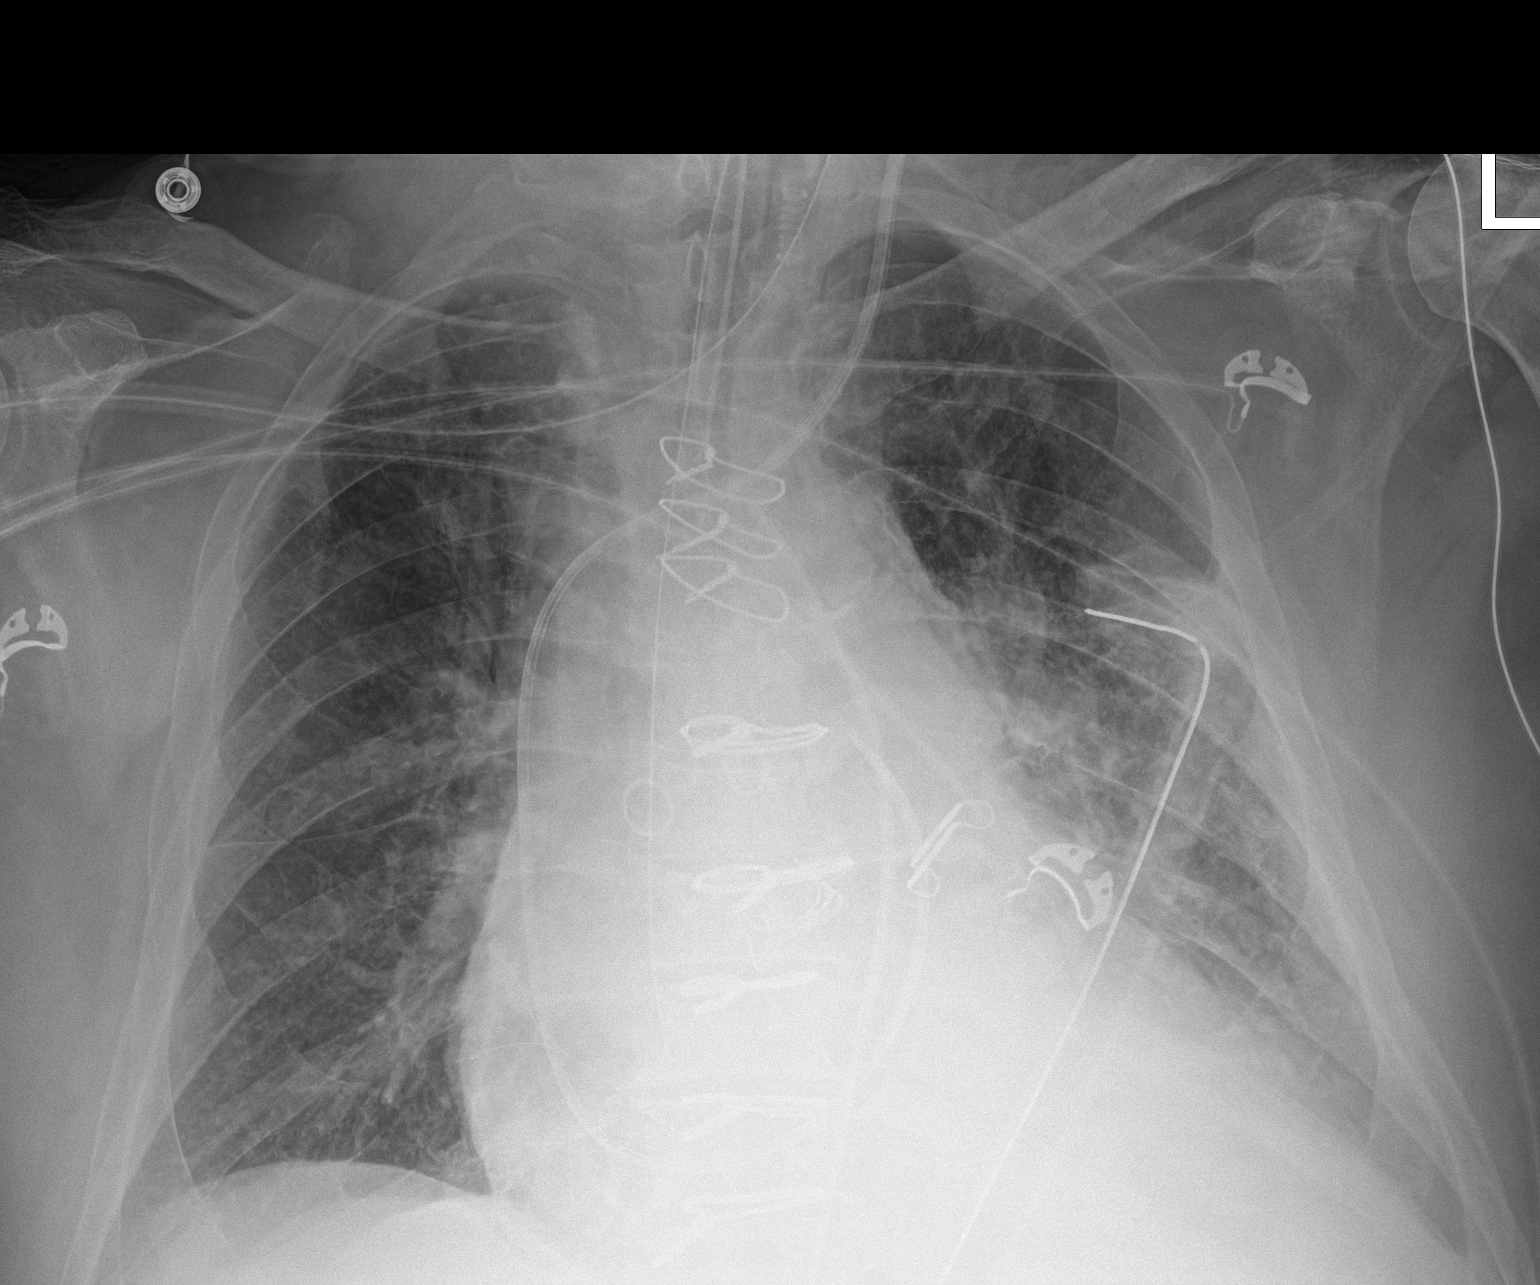

[1 of 1 positions shown; findings below may reference images not displayed]

FINDINGS: Support Apparatus:

--Endotracheal tube: Tip at the level of the clavicular heads.

--Enteric tube:Sideport projects over the distal esophagus.
Recommend advancing by 7 cm.

--Catheter(s):Left IJ approach pulmonary arterial catheter with tip
projecting over the main pulmonary artery.

--Other: Left chest tube tip projects over the left mid lung.
Paramedian mediastinal drain.

Moderate cardiomegaly with mild airspace opacity in the left lung.
Right lung is clear.
IMPRESSION: 1. Recommend advancing the enteric tube by 7 cm to ensure side port
below the gastroesophageal junction.
2. Satisfactory appearance of the remainder of the support
apparatus.
3. Mild left lung atelectasis.

## 2020-12-02 ENCOUNTER — Other Ambulatory Visit: Payer: Self-pay

## 2020-12-02 DIAGNOSIS — D473 Essential (hemorrhagic) thrombocythemia: Secondary | ICD-10-CM

## 2020-12-02 MED ORDER — HYDROXYUREA 500 MG PO CAPS: ORAL_CAPSULE | ORAL | 0 refills | Status: DC

## 2020-12-16 ENCOUNTER — Other Ambulatory Visit: Payer: Self-pay | Admitting: *Deleted

## 2020-12-16 DIAGNOSIS — D473 Essential (hemorrhagic) thrombocythemia: Secondary | ICD-10-CM

## 2020-12-16 MED ORDER — HYDROXYUREA 500 MG PO CAPS: ORAL_CAPSULE | ORAL | 0 refills | Status: DC

## 2021-01-01 DIAGNOSIS — D044 Carcinoma in situ of skin of scalp and neck: Secondary | ICD-10-CM | POA: Diagnosis not present

## 2021-01-01 DIAGNOSIS — X32XXXD Exposure to sunlight, subsequent encounter: Secondary | ICD-10-CM | POA: Diagnosis not present

## 2021-01-01 DIAGNOSIS — D225 Melanocytic nevi of trunk: Secondary | ICD-10-CM | POA: Diagnosis not present

## 2021-01-01 DIAGNOSIS — L570 Actinic keratosis: Secondary | ICD-10-CM | POA: Diagnosis not present

## 2021-01-06 ENCOUNTER — Other Ambulatory Visit: Payer: Self-pay

## 2021-01-06 ENCOUNTER — Encounter (HOSPITAL_BASED_OUTPATIENT_CLINIC_OR_DEPARTMENT_OTHER): Payer: Self-pay | Admitting: Cardiology

## 2021-01-06 ENCOUNTER — Ambulatory Visit (HOSPITAL_BASED_OUTPATIENT_CLINIC_OR_DEPARTMENT_OTHER): Payer: Medicare Other | Admitting: Cardiology

## 2021-01-06 VITALS — BP 126/74 | HR 98 | Ht 68.0 in | Wt 175.0 lb

## 2021-01-06 DIAGNOSIS — I10 Essential (primary) hypertension: Secondary | ICD-10-CM

## 2021-01-06 DIAGNOSIS — I4821 Permanent atrial fibrillation: Secondary | ICD-10-CM

## 2021-01-06 DIAGNOSIS — I2583 Coronary atherosclerosis due to lipid rich plaque: Secondary | ICD-10-CM

## 2021-01-06 DIAGNOSIS — I2510 Atherosclerotic heart disease of native coronary artery without angina pectoris: Secondary | ICD-10-CM | POA: Diagnosis not present

## 2021-01-06 DIAGNOSIS — Z952 Presence of prosthetic heart valve: Secondary | ICD-10-CM

## 2021-01-06 DIAGNOSIS — D45 Polycythemia vera: Secondary | ICD-10-CM

## 2021-01-06 DIAGNOSIS — Z9889 Other specified postprocedural states: Secondary | ICD-10-CM

## 2021-01-06 DIAGNOSIS — E782 Mixed hyperlipidemia: Secondary | ICD-10-CM

## 2021-01-06 DIAGNOSIS — Z951 Presence of aortocoronary bypass graft: Secondary | ICD-10-CM

## 2021-01-06 DIAGNOSIS — I6523 Occlusion and stenosis of bilateral carotid arteries: Secondary | ICD-10-CM

## 2021-01-06 DIAGNOSIS — I739 Peripheral vascular disease, unspecified: Secondary | ICD-10-CM

## 2021-01-06 NOTE — Patient Instructions (Signed)

## 2021-01-06 NOTE — Progress Notes (Signed)
Cardiology Office Note:    Date:  01/06/2021   ID:  Zebedee Iba Lusher, DOB 04-17-1955, MRN [id number]  PCP:  [name], MD  Cardiologist:  Buford Dresser, MD PhD  Referring MD: [name], MD   CC: follow up  History of Present Illness:    Len Azeez is a 65 y.o. male with a hx of obesity, diabetes, prior tobacco use, hypertension, peripheral vascular disease who is seen in follow up for atrial fibrillation and s/p R CEA and CABG with AVR 01/03/18 for bicuspid aortic valve stenosis. My initial visit with him was on 12/07/17.   Pertinent PMH -atrial fibrillation diagnosed 2019 -R CEA, CABG, AVR (bicuspid valve) 12/2017. -Admission 08/13/2018 for severe anemia 2/2 gastric ulcers -Admission 10/04/2019 for fall, ankle fracture, RP bleed, hemoperitoneum, hemothorax -PAD: 30 mmHg difference between arms, known stenotic disease on dopplers from 12/2017. Noted to have severely dampened flow throughout. L brachial pressures also noted as severely reduced from R arm at the time of dopplers. Remains with claudication, though trying to walk through it. R leg with numbness from vein harvest.  -S/P THA 12/24/2018 -essential thrombocythemia/JAK mutation, followed by Dr. Zhao/Dr. Marin Olp, on hydrea  Today: He is doing well. He was prescribed Lyrica for 3 times a day but is only taking it once. He rarely needs to take his Lasix. He is compliant in taking his other medications but is curious why he is taking most of them.  He is bothered by bilateral hip and spine pain. He hopes to have his hip repaired next year. He is hesitant about having his spine fused because of limited motion. Of note, he had his C4 and C5 vertebrae fused years ago.  He and his wife recently got a puppy after their previous dog passed away at 24 years old. He has mild scratches and bruises from the puppy and his medications.   For activity, he walks his puppy around his assisted living facility if the  weather permits. 3 loops around the facility is 1 mile.   Denies chest pain, shortness of breath at rest or with normal exertion. No PND, orthopnea,  or unexpected weight gain. No syncope or palpitations. No melena or hematuria.   Past Medical History:  Diagnosis Date   Acute meniscal tear of knee LEFT   Anemia    Aortic stenosis 12/01/2017   NONRHEUMATIC, AORTIC VALVE CALCIFICATIONS, MILD TO MODERATE REGURG, MILD TO MODERATE CALCIFIED ANNULUS per ECHO 10/25/17 @ [facility] [address]   Arthritis    Atrial fibrillation ([place]) 11/12/2017   AT O/V WITH PCP   Cancer ([place])    skin right arm   Coronary artery disease    DM (diabetes mellitus) ([place])    Heart murmur MILD-- ASYMPTOMATIC   History of kidney stones    Hyperlipidemia    Hypertension    Left knee pain    PAD (peripheral artery disease) ([facility])    left leg claudication    Past Surgical History:  Procedure Laterality Date   AORTIC VALVE REPLACEMENT N/A 01/03/2018   Procedure: AORTIC VALVE REPLACEMENT (AVR) using 20mm Magna Ease Bioprosthesis Aortic Valve;  Surgeon: Ivin Poot, MD;  Location: [facility];  Service: Open Heart Surgery;  Laterality: N/A;   APPENDECTOMY  1998   BIOPSY  08/11/2018   Procedure: BIOPSY;  Surgeon: Mauri Pole, MD;  Location: [place];  Service: Endoscopy;;   CATARACT EXTRACTION W/ INTRAOCULAR LENS  IMPLANT, BILATERAL  1998/  2000   CERVICAL FUSION  1985   C4 - 5   CORONARY ARTERY BYPASS GRAFT N/A 01/03/2018   Procedure: CORONARY ARTERY BYPASS GRAFTING (CABG) x 4 (LIMA to LAD, SVG to DIAGONAL, SVG to RAMUS INTERMEDIATE, and SVG to PDA), USING LEFT INFTERNAL MAMMARY ARTERY AND;  Surgeon: Prescott Gum, Collier Salina, MD;  Location: [facility];  Service: Open Heart Surgery;  Laterality: N/A;   ENDARTERECTOMY Right 01/03/2018   Procedure: ENDARTERECTOMY CAROTID;  Surgeon: Marty Heck, MD;  Location[facility] OR;  Service: Vascular;  Laterality: Right;   ESOPHAGOGASTRODUODENOSCOPY (EGD) WITH PROPOFOL N/A  08/11/2018   Procedure: ESOPHAGOGASTRODUODENOSCOPY (EGD) WITH PROPOFOL;  Surgeon: Mauri Pole, MD;  Location: [place];  Service: Endoscopy;  Laterality: N/A;   KNEE ARTHROSCOPY W/ MENISCECTOMY  1991  1987   LEFT KNEE   x 2   NASAL SINUS SURGERY  1982   ORIF ANKLE FRACTURE Right 10/07/2019   Procedure: OPEN REDUCTION INTERNAL FIXATION (ORIF) ANKLE FRACTURE;  Surgeon: Hiram Gash, MD;  Location: [place];  Service: Orthopedics;  Laterality: Right;  Regional RIGHT  ankle block as well.   RIGHT/LEFT HEART CATH AND CORONARY ANGIOGRAPHY N/A 12/24/2017   Procedure: RIGHT/LEFT HEART CATH AND CORONARY ANGIOGRAPHY;  Surgeon: Jolaine Artist, MD;  Location: [place];  Service: Cardiovascular;  Laterality: N/A;   ROTATOR CUFF REPAIR  09-04-2005   LEFT SHOULDER   TEE WITHOUT CARDIOVERSION N/A 12/24/2017   Procedure: TRANSESOPHAGEAL ECHOCARDIOGRAM (TEE);  Surgeon: Jolaine Artist, MD;  Location: [facility] ENDOSCOPY;  Service: Cardiovascular;  Laterality: N/A;   TEE WITHOUT CARDIOVERSION N/A 01/03/2018   Procedure: TRANSESOPHAGEAL ECHOCARDIOGRAM (TEE);  Surgeon: Prescott Gum, Collier Salina, MD;  Location: [place];  Service: Open Heart Surgery;  Laterality: N/A;   TONSILLECTOMY  AGE 48   TOTAL HIP ARTHROPLASTY Right 12/23/2018   Procedure: TOTAL HIP ARTHROPLASTY ANTERIOR APPROACH;  Surgeon: Paralee Cancel, MD;  Location: [facility];  Service: Orthopedics;  Laterality: Right;  70 mins    Current Medications: Current Outpatient Medications on File Prior to Visit  Medication Sig   albuterol (VENTOLIN HFA) 108 (90 Base) MCG/ACT inhaler Inhale into the lungs.   bisacodyl (DULCOLAX) 5 MG EC tablet Take 1 tablet (5 mg total) by mouth daily as needed for moderate constipation.   Carboxymethylcellulose Sodium (REFRESH TEARS OP) Place 1-2 drops into both eyes daily as needed (dry eyes).   Cholecalciferol 125 MCG (5000 UT) capsule Take 5,000 Units by mouth daily.    cyclobenzaprine (FLEXERIL) 10 MG tablet Take 10 mg  by mouth 3 (three) times daily.    ELIQUIS 5 MG TABS tablet Take 5 mg by mouth 2 (two) times daily.   ferrous sulfate 325 (65 FE) MG tablet Take 1 tablet (325 mg total) by mouth 2 (two) times daily with a meal.   furosemide (LASIX) 20 MG tablet Take 20 mg by mouth daily as needed for fluid.   gabapentin (NEURONTIN) 300 MG capsule Take 300 mg by mouth 3 (three) times daily.   HYDROcodone-acetaminophen (NORCO) 7.5-325 MG tablet Take 1-2 tablets by mouth every 4 (four) hours as needed for moderate pain.    hydroxyurea (HYDREA) 500 MG capsule TAKE 1 CAPSULE BY MOUTH 2 TIMES DAILY. MAY TAKE WITH FOOD TO MINIMIZE GI SIDE EFFECTS.   Lidocaine (BLUE-EMU PAIN RELIEF DRY EX) Apply 1 application topically daily as needed (pain).   loratadine (CLARITIN) 10 MG tablet Take 10 mg by mouth daily.   metoprolol succinate (TOPROL-XL) 25 MG 24 hr tablet Take 25 mg by mouth 2 (  two) times daily.   Multiple Vitamins-Minerals (CENTRUM ADULTS) TABS Take 1 capsule by mouth daily.   pantoprazole (PROTONIX) 40 MG tablet TAKE 1 TABLET BY MOUTH EVERY DAY (Patient taking differently: Take 40 mg by mouth daily.)   potassium chloride (MICRO-K) 10 MEQ CR capsule Take 10 mEq by mouth daily as needed (take when taking lasix).   pregabalin (LYRICA) 50 MG capsule Take 50 mg by mouth 3 (three) times daily. Patient is only taking one tablet daily   rosuvastatin (CRESTOR) 20 MG tablet TAKE 1 TABLET BY MOUTH EVERY DAY IN THE EVENING   zolpidem (AMBIEN) 10 MG tablet Take 10 mg by mouth at bedtime.    albuterol (PROVENTIL) (2.5 MG/3ML) 0.083% nebulizer solution Inhale 3 mLs into the lungs every 6 (six) hours as needed for wheezing or shortness of breath. (Patient not taking: Reported on 01/06/2021)   docusate sodium (COLACE) 100 MG capsule Take 1 capsule (100 mg total) by mouth 2 (two) times daily. (Patient not taking: Reported on 01/06/2021)   senna-docusate (SENOKOT-S) 8.6-50 MG tablet Take 1 tablet by mouth 2 (two) times daily. (Patient  not taking: Reported on 01/06/2021)   tamsulosin (FLOMAX) 0.4 MG CAPS capsule Take 1 capsule (0.4 mg total) by mouth daily. (Patient not taking: Reported on 01/06/2021)   No current facility-administered medications on file prior to visit.    Allergies:   Codeine, Doxycycline, and Dilaudid [hydromorphone]   Social History   Tobacco Use   Smoking status: Former    Packs/day: 0.25    Years: 23.00    Pack years: 5.75    Types: Cigars, Cigarettes    Quit date: 01/02/2018    Years since quitting: 3.0   Smokeless tobacco: Never   Tobacco comments:    quit cigs in 2011 and smokes cigars daily- from 3-10 cigars-09/27/13  Vaping Use   Vaping Use: Never used  Substance Use Topics   Alcohol use: Not Currently    Alcohol/week: 0.0 standard drinks    Comment: occasional   Drug use: No    Family History: Family history unknown as he was adopted.  ROS:   Please see the history of present illness.   (+) Bilateral hip pain (+) Spinal pain (+) Bilateral UE bruises  Additional pertinent ROS otherwise unremarkable.  EKGs/Labs/Other Studies Reviewed:    The following studies were reviewed today: Carotid Duplex 08/13/20: Right Carotid: Velocities in the right ICA are consistent with a 1-39%  stenosis. The ECA appears occluded.  Left Carotid: Very dampened flow noted in the CCA. Bidirectional flow  noted in the ICA suggesting a distal occlusion. ECA is patent with low-resistant flow.  Vertebrals:  Bilateral vertebral arteries demonstrate antegrade flow.  Subclavians: Left subclavian artery was stenotic. Normal flow hemodynamics  were seen in the right subclavian artery.  Echo 08/01/18  1. The left ventricle has normal systolic function with an ejection fraction of 60-65%. The cavity size was normal. There is mildly increased left ventricular wall thickness. Left ventricular diastolic Doppler parameters are indeterminate.  2. The right ventricle has normal systolic function.  3. Left atrial  size was moderately dilated.  4. A 23mm an [employer] valve is present in the aortic position. Procedure Date: 01/03/18.  5. Aortic valve prosthesis appears to open well.  Echo 10/25/17 - Left ventricle: Wall thickness was increased in a pattern of mild   LVH. Systolic function was mildly to moderately reduced. The   estimated ejection fraction was in the range of 40% to  45%. - Aortic valve: Mildly to moderately calcified annulus. Mildly   thickened, moderately calcified leaflets. There was mild to   moderate regurgitation. Valve area (VTI): 0.93 cm^2. Valve area   (Vmax): 0.93 cm^2. Valve area (Vmean): 0.9 cm^2. - Left atrium: The atrium was mildly dilated. - Right atrium: The atrium was mildly dilated.  TEE 12/24/17 - Left ventricle: The estimated ejection fraction was 55%. - Aortic valve: Trileaflet; moderately calcified leaflets; fusion   of the left-noncoronary commissure. There was moderate stenosis. - Mitral valve: No evidence of vegetation. There was mild to   moderate regurgitation. - Left atrium: The atrium was dilated. No evidence of thrombus in   the atrial cavity or appendage. - Right atrium: The atrium was dilated. - Atrial septum: No defect or patent foramen ovale was identified. - Tricuspid valve: No evidence of vegetation. - Pulmonic valve: No evidence of vegetation.  [facility] 12/24/17 Ost RCA to Prox RCA lesion is 95% stenosed. Prox LAD lesion is 95% stenosed. Mid RCA lesion is 80% stenosed. Dist RCA lesion is 99% stenosed. Post Atrio lesion is 50% stenosed. Prox Cx to Mid Cx lesion is 80% stenosed. Mid LM to Dist LM lesion is 50% stenosed. Ost 1st Diag lesion is 90% stenosed. There is moderate aortic valve stenosis.   Findings:   RA = 6 RV = 34/5 PA = 34/9 (20) PCW = 21 Fick cardiac output/index = 5.5/2.7 PVR = < 1.0 WU Ao sat = 97% PA sat = 73%, 74%   Assessment:  1. Moderate aortic stenosis 2. Severe 3v CAD 3. Normal LV function 4.  Relatively normal hemodynamics  Plan/Discussion:  Films reviewed with Dr. Prescott Gum. Plan AVR/CABG +/- Maze with Dr. Prescott Gum next week.   Surgery 01/03/18 Procedure (s):  1.  Coronary artery bypass grafting x4 (left internal mammary artery to left anterior descending, saphenous vein graft to diagonal, saphenous vein graft to OM1, saphenous vein graft to posterior descending). 2.  Aortic valve replacement with a 23 mm Edwards pericardial valve, serial [id number]. 3.  Application of left atrial clip, AtriCure 35 mm. 4.  Endoscopic harvest of right leg greater saphenous vein by Dr. Prescott Gum on 01/03/2018.  Also R CEA with bovine patch angioplasty  EKG:  EKG is personally reviewed.   01/06/21: atrial fibrillation at 98 bpm 07/08/20: atrial fibrillation at 67 bpm  Recent Labs: 06/04/2020: ALT 8; BUN 6; Creatinine 0.70; Hemoglobin 15.1; Platelet Count 257; Potassium 4.6; Sodium 134  Recent Lipid Panel    Component Value Date/Time   CHOL 127 02/17/2018 1014   TRIG 80 02/17/2018 1014   HDL 41 02/17/2018 1014   CHOLHDL 3.1 02/17/2018 1014   VLDL 16 02/17/2018 1014   LDLCALC 70 02/17/2018 1014    Physical Exam:    VS:  BP 126/74    Pulse 98    Ht 5\' 8"  (1.727 m)    Wt 175 lb (79.4 kg)    SpO2 97%    BMI 26.61 kg/m     Wt Readings from Last 3 Encounters:  01/06/21 175 lb (79.4 kg)  08/13/20 178 lb (80.7 kg)  07/08/20 179 lb (81.2 kg)    GEN: Well nourished, well developed in no acute distress HEENT: Normal, moist mucous membranes NECK: No JVD CARDIAC: irregularly irregular, normal S1 and S2, no rubs or gallops. 2/6 systolic murmur. VASCULAR: Radial and DP pulses 2+ bilaterally. RESPIRATORY:  Clear to auscultation without rales, wheezing or rhonchi  ABDOMEN: Soft, non-tender, non-distended MUSCULOSKELETAL:  Ambulates  independently SKIN: Warm and dry, no edema NEUROLOGIC:  Alert and oriented x 3. No focal neuro deficits noted. PSYCHIATRIC:  Normal affect    ASSESSMENT:    1.  Permanent atrial fibrillation (HCC)   2. Carotid stenosis, bilateral   3. Coronary artery disease due to lipid rich plaque   4. S/P CABG x 4   5. S/P AVR (aortic valve replacement)   6. History of CEA (carotid endarterectomy)   7. PAD (peripheral artery disease) ([place])   8. Essential hypertension   9. Mixed hyperlipidemia   10. Polycythemia vera ([facility])      PLAN:    S/P R CEA, CAD s/p CABG x4, AVR (for bicuspid AV stenosis) 12/2017:  -continue metoprolol succinate 25 mg daily. -was on 81 mg aspirin for valve and CAD. Has been stopped given bleeds -continue rosuvastatin 20 mg.  -had infection on jardiance. Consider GLP1RA for diabetes/ASCVD benefit -has endocarditis prophylaxis antibiotics (amoxicillin) though has dentures -echo with EF 60-65%, valve functioning well  Atrial fibrillation, permanent: goal of rate control -chadsvasc=3, s/p LA clip. His polycythemia also places him at higher risk of clot as well.  -Continue rate control with metoprolol, anticoagulation with apixaban  PAD: no claudication currently -no longer smoking. -statin as above -followed by vascular for PAD, carotid disease  Hypertension: at goal, avoid hypotension give PAD pressure differences -continue metoprolol  Hyperlipidemia, mixed: goal LDL <70 -LDL 63, TG 144 09/13/20 -continue rosuvastatin 20 mg  Follow up: 6 months  Medication Adjustments/Labs and Tests Ordered: Current medicines are reviewed at length with the patient today.  Concerns regarding medicines are outlined above.  Orders Placed This Encounter  Procedures   EKG 12-Lead    No orders of the defined types were placed in this encounter.    Patient Instructions  Medication Instructions:  Your Physician recommend you continue on your current medication as directed.    *If you need a refill on your cardiac medications before your next appointment, please call your pharmacy*   Lab Work: None ordered  today   Testing/Procedures: None ordered today   Follow-Up: At [facility], you and your health needs are our priority.  As part of our continuing mission to provide you with exceptional heart care, we have created designated Provider Care Teams.  These Care Teams include your primary Cardiologist (physician) and Advanced Practice Providers (APPs -  Physician Assistants and Nurse Practitioners) who all work together to provide you with the care you need, when you need it.  We recommend signing up for the patient portal called "MyChart".  Sign up information is provided on this After Visit Summary.  MyChart is used to connect with patients for Virtual Visits (Telemedicine).  Patients are able to view lab/test results, encounter notes, upcoming appointments, etc.  Non-urgent messages can be sent to your provider as well.   To learn more about what you can do with MyChart, go to [email or web address].    Your next appointment:   6 month(s)  The format for your next appointment:   In Person  Provider:   Buford Dresser, MD       Wilhemina Bonito as a scribe for Buford Dresser, MD.,have documented all relevant documentation on the behalf of Buford Dresser, MD,as directed by  Buford Dresser, MD while in the presence of Buford Dresser, MD.  I, Buford Dresser, MD, have reviewed all documentation for this visit. The documentation on 01/06/21 for the exam, diagnosis, procedures, and orders are all accurate and complete.  Signed, Buford Dresser, MD PhD 01/06/2021    [place]

## 2021-01-07 ENCOUNTER — Ambulatory Visit: Payer: Medicare Other | Admitting: Hematology & Oncology

## 2021-01-07 ENCOUNTER — Other Ambulatory Visit: Payer: Medicare Other

## 2021-01-09 ENCOUNTER — Other Ambulatory Visit: Payer: Self-pay

## 2021-01-09 ENCOUNTER — Inpatient Hospital Stay: Payer: Medicare Other | Admitting: Hematology & Oncology

## 2021-01-09 ENCOUNTER — Inpatient Hospital Stay: Payer: Medicare Other | Attending: Hematology & Oncology

## 2021-01-09 VITALS — BP 115/72 | HR 79 | Temp 98.0°F | Resp 19 | Wt 176.0 lb

## 2021-01-09 DIAGNOSIS — D721 Eosinophilia, unspecified: Secondary | ICD-10-CM

## 2021-01-09 DIAGNOSIS — Z79899 Other long term (current) drug therapy: Secondary | ICD-10-CM | POA: Insufficient documentation

## 2021-01-09 DIAGNOSIS — I4891 Unspecified atrial fibrillation: Secondary | ICD-10-CM | POA: Diagnosis not present

## 2021-01-09 DIAGNOSIS — D75839 Thrombocytosis, unspecified: Secondary | ICD-10-CM | POA: Insufficient documentation

## 2021-01-09 DIAGNOSIS — D751 Secondary polycythemia: Secondary | ICD-10-CM

## 2021-01-09 DIAGNOSIS — Z7901 Long term (current) use of anticoagulants: Secondary | ICD-10-CM | POA: Diagnosis not present

## 2021-01-09 DIAGNOSIS — D473 Essential (hemorrhagic) thrombocythemia: Secondary | ICD-10-CM

## 2021-01-09 LAB — CMP (CANCER CENTER ONLY)
ALT: 11 U/L (ref 0–44)
AST: 28 U/L (ref 15–41)
Albumin: 3.9 g/dL (ref 3.5–5.0)
Alkaline Phosphatase: 133 U/L — ABNORMAL HIGH (ref 38–126)
Anion gap: 7 (ref 5–15)
BUN: 10 mg/dL (ref 8–23)
CO2: 27 mmol/L (ref 22–32)
Calcium: 9.9 mg/dL (ref 8.9–10.3)
Chloride: 99 mmol/L (ref 98–111)
Creatinine: 0.81 mg/dL (ref 0.61–1.24)
GFR, Estimated: >60 mL/min (ref >60)
Glucose, Bld: 106 mg/dL — ABNORMAL HIGH (ref 70–99)
Potassium: 4.4 mmol/L (ref 3.5–5.1)
Sodium: 133 mmol/L — ABNORMAL LOW (ref 135–145)
Total Bilirubin: 0.8 mg/dL (ref 0.3–1.2)
Total Protein: 8 g/dL (ref 6.5–8.1)

## 2021-01-09 LAB — IRON AND IRON BINDING CAPACITY (CC-WL,HP ONLY)
Iron: 47 ug/dL (ref 45–182)
Saturation Ratios: 17 % — ABNORMAL LOW (ref 17.9–39.5)
TIBC: 276 ug/dL (ref 250–450)
UIBC: 229 ug/dL (ref 117–376)

## 2021-01-09 LAB — LACTATE DEHYDROGENASE: LDH: 610 U/L — ABNORMAL HIGH (ref 98–192)

## 2021-01-09 LAB — CBC WITH DIFFERENTIAL (CANCER CENTER ONLY)
Abs Immature Granulocytes: 2.3 10*3/uL — ABNORMAL HIGH (ref 0.00–0.07)
Basophils Absolute: 0.9 10*3/uL — ABNORMAL HIGH (ref 0.0–0.1)
Basophils Relative: 2 %
Eosinophils Absolute: 0.2 10*3/uL (ref 0.0–0.5)
Eosinophils Relative: 0 %
HCT: 45.7 % (ref 39.0–52.0)
Hemoglobin: 15.4 g/dL (ref 13.0–17.0)
Immature Granulocytes: 6 %
Lymphocytes Relative: 3 %
Lymphs Abs: 1.3 10*3/uL (ref 0.7–4.0)
MCH: 35.2 pg — ABNORMAL HIGH (ref 26.0–34.0)
MCHC: 33.7 g/dL (ref 30.0–36.0)
MCV: 104.6 fL — ABNORMAL HIGH (ref 80.0–100.0)
Monocytes Absolute: 1.8 10*3/uL — ABNORMAL HIGH (ref 0.1–1.0)
Monocytes Relative: 5 %
Neutro Abs: 33.1 10*3/uL — ABNORMAL HIGH (ref 1.7–7.7)
Neutrophils Relative %: 84 %
Platelet Count: 219 10*3/uL (ref 150–400)
RBC: 4.37 MIL/uL (ref 4.22–5.81)
RDW: 20.4 % — ABNORMAL HIGH (ref 11.5–15.5)
WBC Count: 39.6 10*3/uL — ABNORMAL HIGH (ref 4.0–10.5)
nRBC: 0.3 % — ABNORMAL HIGH (ref 0.0–0.2)

## 2021-01-09 LAB — SAVE SMEAR(SSMR), FOR PROVIDER SLIDE REVIEW

## 2021-01-09 LAB — FERRITIN: Ferritin: 120 ng/mL (ref 24–336)

## 2021-01-09 NOTE — Progress Notes (Signed)
Hematology and Oncology Follow Up Visit  Justin [name] [id number] 1956-01-08 65 y.o. 01/09/2021   Principle Diagnosis:  Essential thrombocythemia, JAK2 V617 mutation+; high risk (age >11) - Previous patient of Dr. 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 Galentine is here today for follow-up.  He is doing pretty well.  We last saw him in May.  He had no problems over the summertime.  Unfortunate, he did have to let his 66 year old Doberman pinscher go.  He actually did get a new 1.  He has had no problems with the Hydrea.  He does have atrial fibrillation.  He is on Eliquis.  He has had no problems with headache.  He has had no bleeding.  He has had no nausea or vomiting.  There has been no cough.  That he has had no swollen lymph nodes.  He has not noted any leg swelling.  Overall, I would say his performance status is probably ECOG 0.    Medications:  Allergies as of 01/09/2021       Reactions   Codeine Swelling   Tolerates hydrocodone, tramadol, and oxycodone    Doxycycline Other (See Comments)   Dilaudid [hydromorphone] Anxiety   Hallucinations         Medication List        Accurate as of January 09, 2021  9:36 AM. If you have any questions, ask your nurse or doctor.          STOP taking these medications    Dulcolax 5 MG EC tablet Generic drug: bisacodyl Stopped by: Volanda Napoleon, MD       TAKE these medications    albuterol 108 (90 Base) MCG/ACT inhaler Commonly known  as: VENTOLIN HFA Inhale into the lungs. What changed: Another medication with the same name was removed. Continue taking this medication, and follow the directions you see here. Changed by: Volanda Napoleon, MD   BLUE-EMU PAIN RELIEF DRY EX Apply 1 application topically daily as needed (pain).   Centrum Adults Tabs Take 1 capsule by mouth daily.   Cholecalciferol 125 MCG (5000 UT) capsule Take 5,000 Units by mouth daily.   cyclobenzaprine 10 MG tablet Commonly known as: FLEXERIL Take 10 mg by mouth 3 (three) times daily.   docusate sodium 100 MG capsule Commonly known as: COLACE Take 1 capsule (100 mg total) by mouth 2 (two) times daily.   Eliquis 5 MG Tabs tablet Generic drug: apixaban Take 5 mg by mouth 2 (two) times daily.   ferrous sulfate 325 (65 FE) MG tablet Take 1 tablet (325 mg total) by mouth 2 (two) times daily with a meal.   furosemide 20 MG tablet Commonly known as: LASIX Take 20 mg by mouth daily as needed for fluid.   gabapentin 300 MG capsule Commonly known as: NEURONTIN Take 300 mg by mouth 3 (three) times daily.   HYDROcodone-acetaminophen 7.5-325 MG tablet Commonly known as: NORCO Take 1-2 tablets by mouth every 4 (four) hours as needed for moderate pain.   hydroxyurea 500 MG capsule Commonly  known as: HYDREA TAKE 1 CAPSULE BY MOUTH 2 TIMES DAILY. MAY TAKE WITH FOOD TO MINIMIZE GI SIDE EFFECTS.   loratadine 10 MG tablet Commonly known as: CLARITIN Take 10 mg by mouth daily.   metoprolol succinate 25 MG 24 hr tablet Commonly known as: TOPROL-XL Take 25 mg by mouth 2 (two) times daily.   pantoprazole 40 MG tablet Commonly known as: PROTONIX TAKE 1 TABLET BY MOUTH EVERY DAY   potassium chloride 10 MEQ CR capsule Commonly known as: MICRO-K Take 10 mEq by mouth daily as needed (take when taking lasix).   pregabalin 50 MG capsule Commonly known as: LYRICA Take 50 mg by mouth 3 (three) times daily. Patient is only taking one tablet daily    REFRESH TEARS OP Place 1-2 drops into both eyes daily as needed (dry eyes).   rosuvastatin 20 MG tablet Commonly known as: CRESTOR TAKE 1 TABLET BY MOUTH EVERY DAY IN THE EVENING   senna-docusate 8.6-50 MG tablet Commonly known as: Senokot-S Take 1 tablet by mouth 2 (two) times daily.   tamsulosin 0.4 MG Caps capsule Commonly known as: FLOMAX Take 1 capsule (0.4 mg total) by mouth daily.   zolpidem 10 MG tablet Commonly known as: AMBIEN Take 10 mg by mouth at bedtime.        Allergies:  Allergies  Allergen Reactions   Codeine Swelling    Tolerates hydrocodone, tramadol, and oxycodone    Doxycycline Other (See Comments)   Dilaudid [Hydromorphone] Anxiety    Hallucinations     Past Medical History, Surgical history, Social history, and Family History were reviewed and updated.  Review of Systems: Review of Systems  Constitutional: Negative.   HENT: Negative.    Eyes: Negative.   Respiratory: Negative.    Cardiovascular: Negative.   Gastrointestinal: Negative.   Genitourinary: Negative.   Musculoskeletal:  Positive for back pain and joint pain.  Skin: Negative.   Neurological: Negative.   Endo/Heme/Allergies: Negative.   Psychiatric/Behavioral: Negative.      Physical Exam:  weight is 176 lb (79.8 kg). His oral temperature is 98 F (36.7 C). His blood pressure is 115/72 and his pulse is 79. His respiration is 19 and oxygen saturation is 96%.   Wt Readings from Last 3 Encounters:  01/09/21 176 lb (79.8 kg)  01/06/21 175 lb (79.4 kg)  08/13/20 178 lb (80.7 kg)    Physical Exam Vitals reviewed.  HENT:     Head: Normocephalic and atraumatic.  Eyes:     Pupils: Pupils are equal, round, and reactive to light.  Cardiovascular:     Rate and Rhythm: Normal rate and regular rhythm.     Heart sounds: Normal heart sounds.  Pulmonary:     Effort: Pulmonary effort is normal.     Breath sounds: Normal breath sounds.  Abdominal:     General: Bowel sounds are  normal.     Palpations: Abdomen is soft.  Musculoskeletal:        General: No tenderness or deformity. Normal range of motion.     Cervical back: Normal range of motion.  Lymphadenopathy:     Cervical: No cervical adenopathy.  Skin:    General: Skin is warm and dry.     Findings: No erythema or rash.  Neurological:     Mental Status: He is alert and oriented to person, place, and time.  Psychiatric:        Behavior: Behavior normal.        Thought Content: Thought content  normal.        Judgment: Judgment normal.     Lab Results  Component Value Date   WBC 39.6 (H) 01/09/2021   HGB 15.4 01/09/2021   HCT 45.7 01/09/2021   MCV 104.6 (H) 01/09/2021   PLT 219 01/09/2021   Lab Results  Component Value Date   FERRITIN 121 11/03/2018   IRON 31 (L) 11/03/2018   TIBC 284 11/03/2018   UIBC 253 11/03/2018   IRONPCTSAT 11 (L) 11/03/2018   Lab Results  Component Value Date   RETICCTPCT 5.4 (H) 08/13/2018   RBC 4.37 01/09/2021   No results found for: KPAFRELGTCHN, LAMBDASER, KAPLAMBRATIO No results found for: IGGSERUM, IGA, IGMSERUM No results found for: TOTALPROTELP, ALBUMINELP, A1GS, A2GS, BETS, BETA2SER, GAMS, MSPIKE, SPEI   Chemistry      Component Value Date/Time   NA 134 (L) 06/04/2020 1019   K 4.6 06/04/2020 1019   CL 99 06/04/2020 1019   CO2 28 06/04/2020 1019   BUN 6 (L) 06/04/2020 1019   CREATININE 0.70 06/04/2020 1019      Component Value Date/Time   CALCIUM 9.7 06/04/2020 1019   ALKPHOS 133 (H) 06/04/2020 1019   AST 21 06/04/2020 1019   ALT 8 06/04/2020 1019   BILITOT 0.7 06/04/2020 1019       Impression and Plan:  Mr. Kitchings is a very pleasant 65 yo caucasian gentleman with essential thrombocythemia, JAK2 V617 mutation +, high risk (age > 80).   I will little surprised of the white cell count is not lower.  We can always increase the dose of Hydrea if necessary.  For right now, I would like to keep the dose stable.  I will plan to see him back in 3  months now.  I think we have to follow him a little bit more closely just because of the white cell count elevation.  Thankfully he is on Eliquis which will certainly help protect against thromboembolic disease.    Volanda Napoleon, MD 12/22/20229:36 AM

## 2021-02-14 ENCOUNTER — Emergency Department (HOSPITAL_BASED_OUTPATIENT_CLINIC_OR_DEPARTMENT_OTHER)
Admission: EM | Admit: 2021-02-14 | Discharge: 2021-02-14 | Disposition: A | Discharge status: Home / Self Care | Payer: Medicare Other | Attending: Emergency Medicine | Admitting: Emergency Medicine

## 2021-02-14 ENCOUNTER — Encounter (HOSPITAL_BASED_OUTPATIENT_CLINIC_OR_DEPARTMENT_OTHER): Payer: Self-pay

## 2021-02-14 ENCOUNTER — Other Ambulatory Visit: Payer: Self-pay

## 2021-02-14 DIAGNOSIS — Z7901 Long term (current) use of anticoagulants: Secondary | ICD-10-CM | POA: Insufficient documentation

## 2021-02-14 DIAGNOSIS — I2510 Atherosclerotic heart disease of native coronary artery without angina pectoris: Secondary | ICD-10-CM | POA: Insufficient documentation

## 2021-02-14 DIAGNOSIS — E119 Type 2 diabetes mellitus without complications: Secondary | ICD-10-CM | POA: Diagnosis not present

## 2021-02-14 DIAGNOSIS — I4891 Unspecified atrial fibrillation: Secondary | ICD-10-CM | POA: Insufficient documentation

## 2021-02-14 DIAGNOSIS — S6991XA Unspecified injury of right wrist, hand and finger(s), initial encounter: Secondary | ICD-10-CM | POA: Diagnosis present

## 2021-02-14 DIAGNOSIS — Z951 Presence of aortocoronary bypass graft: Secondary | ICD-10-CM | POA: Diagnosis not present

## 2021-02-14 DIAGNOSIS — S61411A Laceration without foreign body of right hand, initial encounter: Secondary | ICD-10-CM | POA: Insufficient documentation

## 2021-02-14 DIAGNOSIS — Z79899 Other long term (current) drug therapy: Secondary | ICD-10-CM | POA: Insufficient documentation

## 2021-02-14 DIAGNOSIS — Z23 Encounter for immunization: Secondary | ICD-10-CM | POA: Insufficient documentation

## 2021-02-14 DIAGNOSIS — W548XXA Other contact with dog, initial encounter: Secondary | ICD-10-CM | POA: Insufficient documentation

## 2021-02-14 DIAGNOSIS — I10 Essential (primary) hypertension: Secondary | ICD-10-CM | POA: Insufficient documentation

## 2021-02-14 DIAGNOSIS — S61451A Open bite of right hand, initial encounter: Secondary | ICD-10-CM | POA: Diagnosis not present

## 2021-02-14 MED ORDER — TETANUS-DIPHTH-ACELL PERTUSSIS 5-2.5-18.5 LF-MCG/0.5 IM SUSY
0.5000 mL | PREFILLED_SYRINGE | Freq: Once | INTRAMUSCULAR | Status: AC
Start: 2021-02-14 — End: 2021-02-14
  Administered 2021-02-14: 0.5 mL via INTRAMUSCULAR
  Filled 2021-02-14: qty 0.5

## 2021-02-14 NOTE — Discharge Instructions (Signed)
You came to the emergency department today to be evaluated for the wound on your right hand.  To control the bleeding a hemostatic agent was applied.  Please keep the dressing on for the next 48 hours.  After that you may remove the dressing.  Please follow-up with your primary care provider as needed.  Return to the emergency department if: -Your bleeding soaks through your bandages -You develop numbness or weakness to your fingers -You develop color change including pallor or cyanosis to your fingers -You become lightheaded, dizzy, or pass out

## 2021-02-14 NOTE — ED Triage Notes (Signed)
Pt came in POV c/o of bleeding to the right Arm/Hand d/t a previous dog scratch on Tuesday but bleeding has not fully resolved. Pt is on Eliquis. Wound is covered with Tegaderm and butterfly bandages at this time.

## 2021-02-14 NOTE — ED Provider Notes (Signed)
[name] DE[facility] Provider Note   CSN: [id number] Arrival date & time: 02/14/21  1452     History  No chief complaint on file.   Justin [name] is a 66 y.o. male with a past medical history of atrial fibrillation (on Eliquis), CAD status post CABG x4, status post aortic valve replacement, carotid endarterectomy, peripheral artery disease, hypertension, hyperlipidemia, polycythemia vera, diabetes mellitus type 2.  Presents to the emergency department with a chief plaint of wound to right hand with continued bleeding.  Patient states that on Tuesday he was excellently scratched by his dog.  Denies any bite from the dog.  Patient states that he has had continued bleeding to the wound since then.  Patient describes bleeding as slow and continuous.  Patient has tried direct pressure and dressings with no improvement in bleeding.  Patient is on blood thinner.  Patient is unsure when his last tetanus shot was.    Denies any numbness, weakness, pallor, color change, lightheadedness, dizziness, syncope.  HPI     Home Medications Prior to Admission medications   Medication Sig Start Date End Date Taking? Authorizing Provider  albuterol (VENTOLIN HFA) 108 (90 Base) MCG/ACT inhaler Inhale into the lungs. 11/11/19   [provider]  Carboxymethylcellulose Sodium (REFRESH TEARS OP) Place 1-2 drops into both eyes daily as needed (dry eyes).    [provider]  Cholecalciferol 125 MCG (5000 UT) capsule Take 5,000 Units by mouth daily.     [provider]  cyclobenzaprine (FLEXERIL) 10 MG tablet Take 10 mg by mouth 3 (three) times daily.     [provider]  docusate sodium (COLACE) 100 MG capsule Take 1 capsule (100 mg total) by mouth 2 (two) times daily. Patient not taking: Reported on 01/06/2021 10/12/19   Margie Billet A, PA-C  ELIQUIS 5 MG TABS tablet Take 5 mg by mouth 2 (two) times daily. 11/29/19   [provider]  ferrous  sulfate 325 (65 FE) MG tablet Take 1 tablet (325 mg total) by mouth 2 (two) times daily with a meal. 08/13/18   Kayleen Memos, DO  furosemide (LASIX) 20 MG tablet Take 20 mg by mouth daily as needed for fluid. 04/17/18   [provider]  gabapentin (NEURONTIN) 300 MG capsule Take 300 mg by mouth 3 (three) times daily.    [provider]  HYDROcodone-acetaminophen (NORCO) 7.5-325 MG tablet Take 1-2 tablets by mouth every 4 (four) hours as needed for moderate pain.  03/28/19   [provider]  hydroxyurea (HYDREA) 500 MG capsule TAKE 1 CAPSULE BY MOUTH 2 TIMES DAILY. MAY TAKE WITH FOOD TO MINIMIZE GI SIDE EFFECTS. 12/16/20   Volanda Napoleon, MD  Lidocaine (BLUE-EMU PAIN RELIEF DRY EX) Apply 1 application topically daily as needed (pain).    [provider]  loratadine (CLARITIN) 10 MG tablet Take 10 mg by mouth daily.    [provider]  metoprolol succinate (TOPROL-XL) 25 MG 24 hr tablet Take 25 mg by mouth 2 (two) times daily.    [provider]  Multiple Vitamins-Minerals (CENTRUM ADULTS) TABS Take 1 capsule by mouth daily.    [provider]  pantoprazole (PROTONIX) 40 MG tablet TAKE 1 TABLET BY MOUTH EVERY DAY Patient taking differently: Take 40 mg by mouth daily. 08/14/19   Cirigliano, Vito V, DO  potassium chloride (MICRO-K) 10 MEQ CR capsule Take 10 mEq by mouth daily as needed (take when taking lasix).    [provider]  pregabalin (LYRICA) 50 MG capsule Take 50 mg by mouth 3 (three) times daily. Patient is only taking one tablet daily    [provider]  rosuvastatin (CRESTOR) 20 MG tablet TAKE 1 TABLET BY MOUTH EVERY DAY IN THE EVENING 01/29/20   Buford Dresser, MD  senna-docusate (SENOKOT-S) 8.6-50 MG tablet Take 1 tablet by mouth 2 (two) times daily. Patient not taking: Reported on 01/06/2021 10/12/19   Margie Billet A, PA-C  tamsulosin (FLOMAX) 0.4 MG CAPS capsule Take 1 capsule (0.4 mg total) by mouth  daily. Patient not taking: Reported on 01/06/2021 10/13/19   Margie Billet A, PA-C  zolpidem (AMBIEN) 10 MG tablet Take 10 mg by mouth at bedtime.     [provider]      Allergies    Codeine, Doxycycline, and Dilaudid [hydromorphone]    Review of Systems   Review of Systems  Musculoskeletal:  Negative for arthralgias and myalgias.  Skin:  Positive for wound. Negative for color change, pallor and rash.  Neurological:  Negative for dizziness, syncope and light-headedness.   Physical Exam Updated Vital Signs BP (!) 141/101    Pulse 81    Temp 98.1 F (36.7 C)    Resp 15    Ht 5\' 8"  (1.727 m)    Wt 77.1 kg    SpO2 96%    BMI 25.85 kg/m  Physical Exam Vitals and nursing note reviewed.  Constitutional:      General: He is not in acute distress.    Appearance: He is not ill-appearing, toxic-appearing or diaphoretic.  HENT:     Head: Normocephalic.  Eyes:     General: No scleral icterus.       Right eye: No discharge.        Left eye: No discharge.  Cardiovascular:     Rate and Rhythm: Normal rate.     Pulses:          Radial pulses are 2+ on the right side.  Pulmonary:     Effort: Pulmonary effort is normal.  Musculoskeletal:     Right wrist: Normal.     Right hand: No swelling, deformity, lacerations, tenderness or bony tenderness. Normal range of motion. Normal strength. Normal sensation. Normal capillary refill.     Comments: 1 mm laceration to dorsum of right hand.  Slow continuous bleeding noted  Skin:    General: Skin is warm and dry.  Neurological:     General: No focal deficit present.     Mental Status: He is alert.  Psychiatric:        Behavior: Behavior is cooperative.    ED Results / Procedures / Treatments   Labs (all labs ordered are listed, but only abnormal results are displayed) Labs Reviewed - No data to display  EKG None  Radiology No results found.  Procedures Procedures    Medications Ordered in ED Medications  Tdap (BOOSTRIX)  injection 0.5 mL (0.5 mLs Intramuscular Given 02/14/21 1546)    ED Course/ Medical Decision Making/ A&P                           Medical Decision Making Risk Prescription drug management.   Alert 65 year old male no acute distress, nontoxic-appearing.  Presents to the emergency department with a chief complaint of continuous bleeding from wound.  Information was obtained from patient and patient's wife at bedside.  Medical records were reviewed.  Patient has small wound to dorsum of right  hand.  Slow continuous bleeding noted from wound.  I applied direct pressure for 10 minutes with no improvement in patient's bleeding.  We will apply hemostatic agent to help control patient's bleeding.  Patient advised to keep hemostatic agent on for the next 48 hours.  Patient follow-up with primary care provider.  Patient was given tetanus shot in the emergency department due to wound with unknown last tetanus shot.  Discussed results, findings, treatment and follow up. Patient advised of return precautions. Patient verbalized understanding and agreed with plan.         Final Clinical Impression(s) / ED Diagnoses Final diagnoses:  Dog scratch    Rx / DC Orders ED Discharge Orders     None         Loni Beckwith, PA-C 02/14/21 1604    Fredia Sorrow, MD 02/16/21 [phone]

## 2021-03-18 ENCOUNTER — Other Ambulatory Visit: Payer: Self-pay

## 2021-03-18 DIAGNOSIS — D473 Essential (hemorrhagic) thrombocythemia: Secondary | ICD-10-CM

## 2021-03-18 MED ORDER — HYDROXYUREA 500 MG PO CAPS: ORAL_CAPSULE | ORAL | 0 refills | Status: DC

## 2021-03-27 ENCOUNTER — Inpatient Hospital Stay: Payer: Medicare Other | Admitting: Hematology & Oncology

## 2021-03-27 ENCOUNTER — Other Ambulatory Visit: Payer: Self-pay

## 2021-03-27 ENCOUNTER — Encounter: Payer: Self-pay | Admitting: Hematology & Oncology

## 2021-03-27 ENCOUNTER — Inpatient Hospital Stay: Payer: Medicare Other | Attending: Hematology & Oncology

## 2021-03-27 VITALS — BP 117/68 | HR 76 | Temp 98.0°F | Resp 18 | Ht 68.0 in | Wt 171.1 lb

## 2021-03-27 DIAGNOSIS — D473 Essential (hemorrhagic) thrombocythemia: Secondary | ICD-10-CM

## 2021-03-27 DIAGNOSIS — D721 Eosinophilia, unspecified: Secondary | ICD-10-CM

## 2021-03-27 DIAGNOSIS — D751 Secondary polycythemia: Secondary | ICD-10-CM

## 2021-03-27 DIAGNOSIS — I4891 Unspecified atrial fibrillation: Secondary | ICD-10-CM | POA: Insufficient documentation

## 2021-03-27 DIAGNOSIS — D75839 Thrombocytosis, unspecified: Secondary | ICD-10-CM | POA: Diagnosis not present

## 2021-03-27 DIAGNOSIS — Z7901 Long term (current) use of anticoagulants: Secondary | ICD-10-CM | POA: Diagnosis not present

## 2021-03-27 LAB — CMP (CANCER CENTER ONLY)
ALT: 10 U/L (ref 0–44)
AST: 26 U/L (ref 15–41)
Albumin: 3.5 g/dL (ref 3.5–5.0)
Alkaline Phosphatase: 131 U/L — ABNORMAL HIGH (ref 38–126)
Anion gap: 8 (ref 5–15)
BUN: 10 mg/dL (ref 8–23)
CO2: 25 mmol/L (ref 22–32)
Calcium: 9 mg/dL (ref 8.9–10.3)
Chloride: 99 mmol/L (ref 98–111)
Creatinine: 0.8 mg/dL (ref 0.61–1.24)
GFR, Estimated: >60 mL/min (ref >60)
Glucose, Bld: 101 mg/dL — ABNORMAL HIGH (ref 70–99)
Potassium: 4.4 mmol/L (ref 3.5–5.1)
Sodium: 132 mmol/L — ABNORMAL LOW (ref 135–145)
Total Bilirubin: 0.8 mg/dL (ref 0.3–1.2)
Total Protein: 7.3 g/dL (ref 6.5–8.1)

## 2021-03-27 LAB — CBC WITH DIFFERENTIAL (CANCER CENTER ONLY)
Abs Immature Granulocytes: 3.14 10*3/uL — ABNORMAL HIGH (ref 0.00–0.07)
Basophils Absolute: 0.9 10*3/uL — ABNORMAL HIGH (ref 0.0–0.1)
Basophils Relative: 2 %
Eosinophils Absolute: 0.2 10*3/uL (ref 0.0–0.5)
Eosinophils Relative: 0 %
HCT: 37.5 % — ABNORMAL LOW (ref 39.0–52.0)
Hemoglobin: 12.7 g/dL — ABNORMAL LOW (ref 13.0–17.0)
Immature Granulocytes: 8 %
Lymphocytes Relative: 4 %
Lymphs Abs: 1.7 10*3/uL (ref 0.7–4.0)
MCH: 35.7 pg — ABNORMAL HIGH (ref 26.0–34.0)
MCHC: 33.9 g/dL (ref 30.0–36.0)
MCV: 105.3 fL — ABNORMAL HIGH (ref 80.0–100.0)
Monocytes Absolute: 1.9 10*3/uL — ABNORMAL HIGH (ref 0.1–1.0)
Monocytes Relative: 5 %
Neutro Abs: 31.6 10*3/uL — ABNORMAL HIGH (ref 1.7–7.7)
Neutrophils Relative %: 81 %
Platelet Count: 235 10*3/uL (ref 150–400)
RBC: 3.56 MIL/uL — ABNORMAL LOW (ref 4.22–5.81)
RDW: 21.1 % — ABNORMAL HIGH (ref 11.5–15.5)
Smear Review: NORMAL
WBC Count: 39.4 10*3/uL — ABNORMAL HIGH (ref 4.0–10.5)
nRBC: 0.5 % — ABNORMAL HIGH (ref 0.0–0.2)

## 2021-03-27 LAB — SAVE SMEAR(SSMR), FOR PROVIDER SLIDE REVIEW

## 2021-03-27 LAB — LACTATE DEHYDROGENASE: LDH: 446 U/L — ABNORMAL HIGH (ref 98–192)

## 2021-03-27 NOTE — Progress Notes (Signed)
Hematology and Oncology Follow Up Visit  Carroll Lingelbach [id number] 25-Jun-1955 66 y.o. 03/27/2021   Principle Diagnosis:  Essential thrombocythemia, JAK2 V617 mutation+; high risk (age >40) - Previous patient of Dr. 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 Tones is here today for follow-up.  Overall, he is doing okay.  We last saw him back in December.  This was right before Christmas.  He has had no problems over the Winter.  Since we have had very warm weather this winter he has been taking his motorcycle out.  His Doberman pinscher puppy is now 80 pounds.  He is 77 months old.  Mr. Worrel has had no problems with Hydrea.  He may have some loose stool.  There is no obvious diarrhea.  He has had no problems with cough or shortness of breath.  He has had no nausea or vomiting.  He has had no rashes.  There is been no leg swelling.  He is on Eliquis for his atrial fibrillation.  He does have quite a few bruises, mostly from the puppy.  He has had no swollen lymph nodes.  Overall, I would say his performance status is ECOG 0.     Medications:  Allergies as of 03/27/2021       Reactions   Codeine Swelling   Tolerates hydrocodone, tramadol, and oxycodone    Dilaudid [hydromorphone] Anxiety   Hallucinations    Doxycycline Itching, Rash        Medication List        Accurate as of March 27, 2021  1:41 PM. If you have any questions, ask your nurse or doctor.          albuterol 108 (90 Base)  MCG/ACT inhaler Commonly known as: VENTOLIN HFA Inhale into the lungs.   BLUE-EMU PAIN RELIEF DRY EX Apply 1 application topically daily as needed (pain).   Centrum Adults Tabs Take 1 capsule by mouth daily.   Cholecalciferol 125 MCG (5000 UT) capsule Take 5,000 Units by mouth daily.   cyclobenzaprine 10 MG tablet Commonly known as: FLEXERIL Take 10 mg by mouth 3 (three) times daily.   docusate sodium 100 MG capsule Commonly known as: COLACE Take 1 capsule (100 mg total) by mouth 2 (two) times daily.   Eliquis 5 MG Tabs tablet Generic drug: apixaban Take 5 mg by mouth 2 (two) times daily.   ferrous sulfate 325 (65 FE) MG tablet Take 1 tablet (325 mg total) by mouth 2 (two) times daily with a meal.   furosemide 20 MG tablet Commonly known as: LASIX Take 20 mg by mouth daily as needed for fluid.   gabapentin 300 MG capsule Commonly known as: NEURONTIN Take 300 mg by mouth 3 (three) times daily.   HYDROcodone-acetaminophen 7.5-325 MG tablet Commonly known as: NORCO Take 1-2 tablets by mouth every 4 (four) hours as needed for moderate pain.   hydroxyurea 500 MG capsule Commonly known as: HYDREA TAKE 1 CAPSULE BY MOUTH 2 TIMES DAILY. MAY TAKE WITH FOOD TO MINIMIZE GI SIDE EFFECTS.  loratadine 10 MG tablet Commonly known as: CLARITIN Take 10 mg by mouth daily.   metoprolol succinate 25 MG 24 hr tablet Commonly known as: TOPROL-XL Take 25 mg by mouth 2 (two) times daily.   pantoprazole 40 MG tablet Commonly known as: PROTONIX TAKE 1 TABLET BY MOUTH EVERY DAY   potassium chloride 10 MEQ CR capsule Commonly known as: MICRO-K Take 10 mEq by mouth daily as needed (take when taking lasix).   pregabalin 50 MG capsule Commonly known as: LYRICA Take 50 mg by mouth 3 (three) times daily. Patient is only taking one tablet daily   REFRESH TEARS OP Place 1-2 drops into both eyes daily as needed (dry eyes).   rosuvastatin 20 MG tablet Commonly known as: CRESTOR TAKE  1 TABLET BY MOUTH EVERY DAY IN THE EVENING   senna-docusate 8.6-50 MG tablet Commonly known as: Senokot-S Take 1 tablet by mouth 2 (two) times daily.   tamsulosin 0.4 MG Caps capsule Commonly known as: FLOMAX Take 1 capsule (0.4 mg total) by mouth daily.   zolpidem 10 MG tablet Commonly known as: AMBIEN Take 10 mg by mouth at bedtime.        Allergies:  Allergies  Allergen Reactions   Codeine Swelling    Tolerates hydrocodone, tramadol, and oxycodone    Dilaudid [Hydromorphone] Anxiety    Hallucinations    Doxycycline Itching and Rash    Past Medical History, Surgical history, Social history, and Family History were reviewed and updated.  Review of Systems: Review of Systems  Constitutional: Negative.   HENT: Negative.    Eyes: Negative.   Respiratory: Negative.    Cardiovascular: Negative.   Gastrointestinal: Negative.   Genitourinary: Negative.   Musculoskeletal:  Positive for back pain and joint pain.  Skin: Negative.   Neurological: Negative.   Endo/Heme/Allergies: Negative.   Psychiatric/Behavioral: Negative.      Physical Exam:  height is $RemoveB'5\' 8"'soQvvTBr$  (1.727 m) and weight is 171 lb 1.9 oz (77.6 kg). His oral temperature is 98 F (36.7 C). His blood pressure is 117/68 and his pulse is 76. His respiration is 18 and oxygen saturation is 97%.   Wt Readings from Last 3 Encounters:  03/27/21 171 lb 1.9 oz (77.6 kg)  02/14/21 170 lb (77.1 kg)  01/09/21 176 lb (79.8 kg)    Physical Exam Vitals reviewed.  HENT:     Head: Normocephalic and atraumatic.  Eyes:     Pupils: Pupils are equal, round, and reactive to light.  Cardiovascular:     Rate and Rhythm: Normal rate and regular rhythm.     Heart sounds: Normal heart sounds.  Pulmonary:     Effort: Pulmonary effort is normal.     Breath sounds: Normal breath sounds.  Abdominal:     General: Bowel sounds are normal.     Palpations: Abdomen is soft.  Musculoskeletal:        General: No tenderness or  deformity. Normal range of motion.     Cervical back: Normal range of motion.  Lymphadenopathy:     Cervical: No cervical adenopathy.  Skin:    General: Skin is warm and dry.     Findings: No erythema or rash.  Neurological:     Mental Status: He is alert and oriented to person, place, and time.  Psychiatric:        Behavior: Behavior normal.        Thought Content: Thought content normal.        Judgment: Judgment normal.  Lab Results  Component Value Date   WBC 39.4 (H) 03/27/2021   HGB 12.7 (L) 03/27/2021   HCT 37.5 (L) 03/27/2021   MCV 105.3 (H) 03/27/2021   PLT 235 03/27/2021   Lab Results  Component Value Date   FERRITIN 120 01/09/2021   IRON 47 01/09/2021   TIBC 276 01/09/2021   UIBC 229 01/09/2021   IRONPCTSAT 17 (L) 01/09/2021   Lab Results  Component Value Date   RETICCTPCT 5.4 (H) 08/13/2018   RBC 3.56 (L) 03/27/2021   No results found for: KPAFRELGTCHN, LAMBDASER, KAPLAMBRATIO No results found for: IGGSERUM, IGA, IGMSERUM No results found for: Kathrynn Ducking, MSPIKE, SPEI   Chemistry      Component Value Date/Time   NA 133 (L) 01/09/2021 0858   K 4.4 01/09/2021 0858   CL 99 01/09/2021 0858   CO2 27 01/09/2021 0858   BUN 10 01/09/2021 0858   CREATININE 0.81 01/09/2021 0858      Component Value Date/Time   CALCIUM 9.9 01/09/2021 0858   ALKPHOS 133 (H) 01/09/2021 0858   AST 28 01/09/2021 0858   ALT 11 01/09/2021 0858   BILITOT 0.8 01/09/2021 0858       Impression and Plan:  Mr. Kurek is a very pleasant 66 yo caucasian gentleman with essential thrombocythemia, JAK2 V617 mutation +, high risk (age > 25).   I the Daas blood smear the microscope.  He does have leukocytosis.  He has the predominance of hypersegmented polys.  I saw a couple eosinophils and basophils.  There may been 1 or 2 immature myeloid cells.  I do not see any blasts.  Platelets were adequate in number and size.  Platelets were well  granulated.  His red cell showed no nucleated red cells.  Again, he is on Hydrea.  His white cell count is high but not going up.  We will have to watch this.  I do not see that we have to do a bone marrow biopsy as of yet.  We will plan to get him back in 3-4 months.  I think this would be reasonable.     Volanda Napoleon, MD 3/9/20231:41 PM

## 2021-03-28 LAB — IRON AND IRON BINDING CAPACITY (CC-WL,HP ONLY)
Iron: 81 ug/dL (ref 45–182)
Saturation Ratios: 26 % (ref 17.9–39.5)
TIBC: 308 ug/dL (ref 250–450)
UIBC: 227 ug/dL (ref 117–376)

## 2021-03-28 LAB — FERRITIN: Ferritin: 92 ng/mL (ref 24–336)

## 2021-06-06 DIAGNOSIS — E782 Mixed hyperlipidemia: Secondary | ICD-10-CM | POA: Diagnosis not present

## 2021-06-06 DIAGNOSIS — E119 Type 2 diabetes mellitus without complications: Secondary | ICD-10-CM | POA: Diagnosis not present

## 2021-06-06 DIAGNOSIS — Z0000 Encounter for general adult medical examination without abnormal findings: Secondary | ICD-10-CM | POA: Diagnosis not present

## 2021-06-06 DIAGNOSIS — N401 Enlarged prostate with lower urinary tract symptoms: Secondary | ICD-10-CM | POA: Diagnosis not present

## 2021-06-06 DIAGNOSIS — I10 Essential (primary) hypertension: Secondary | ICD-10-CM | POA: Diagnosis not present

## 2021-06-24 DIAGNOSIS — L570 Actinic keratosis: Secondary | ICD-10-CM | POA: Diagnosis not present

## 2021-06-24 DIAGNOSIS — C4442 Squamous cell carcinoma of skin of scalp and neck: Secondary | ICD-10-CM | POA: Diagnosis not present

## 2021-06-24 DIAGNOSIS — X32XXXD Exposure to sunlight, subsequent encounter: Secondary | ICD-10-CM | POA: Diagnosis not present

## 2021-07-04 ENCOUNTER — Inpatient Hospital Stay: Payer: Medicare Other | Attending: Hematology & Oncology

## 2021-07-04 ENCOUNTER — Other Ambulatory Visit: Payer: Self-pay

## 2021-07-04 ENCOUNTER — Telehealth: Payer: Self-pay | Admitting: *Deleted

## 2021-07-04 ENCOUNTER — Encounter: Payer: Self-pay | Admitting: Hematology & Oncology

## 2021-07-04 ENCOUNTER — Inpatient Hospital Stay: Payer: Medicare Other | Admitting: Hematology & Oncology

## 2021-07-04 ENCOUNTER — Other Ambulatory Visit: Payer: Self-pay | Admitting: Lab

## 2021-07-04 VITALS — BP 112/69 | HR 72 | Temp 98.1°F | Resp 19 | Wt 173.0 lb

## 2021-07-04 DIAGNOSIS — D473 Essential (hemorrhagic) thrombocythemia: Secondary | ICD-10-CM | POA: Diagnosis not present

## 2021-07-04 LAB — CMP (CANCER CENTER ONLY)
ALT: 10 U/L (ref 0–44)
AST: 26 U/L (ref 15–41)
Albumin: 4 g/dL (ref 3.5–5.0)
Alkaline Phosphatase: 129 U/L — ABNORMAL HIGH (ref 38–126)
Anion gap: 8 (ref 5–15)
BUN: 10 mg/dL (ref 8–23)
CO2: 28 mmol/L (ref 22–32)
Calcium: 9.6 mg/dL (ref 8.9–10.3)
Chloride: 99 mmol/L (ref 98–111)
Creatinine: 0.51 mg/dL — ABNORMAL LOW (ref 0.61–1.24)
GFR, Estimated: >60 mL/min
Glucose, Bld: 104 mg/dL — ABNORMAL HIGH (ref 70–99)
Potassium: 4.2 mmol/L (ref 3.5–5.1)
Sodium: 135 mmol/L (ref 135–145)
Total Bilirubin: 1.2 mg/dL (ref 0.3–1.2)
Total Protein: 8.1 g/dL (ref 6.5–8.1)

## 2021-07-04 LAB — CBC WITH DIFFERENTIAL (CANCER CENTER ONLY)
Abs Immature Granulocytes: 2.03 K/uL — ABNORMAL HIGH (ref 0.00–0.07)
Basophils Absolute: 0.6 K/uL — ABNORMAL HIGH (ref 0.0–0.1)
Basophils Relative: 2 %
Eosinophils Absolute: 0.1 K/uL (ref 0.0–0.5)
Eosinophils Relative: 0 %
HCT: 42.7 % (ref 39.0–52.0)
Hemoglobin: 14.1 g/dL (ref 13.0–17.0)
Immature Granulocytes: 7 %
Lymphocytes Relative: 3 %
Lymphs Abs: 1 K/uL (ref 0.7–4.0)
MCH: 34.4 pg — ABNORMAL HIGH (ref 26.0–34.0)
MCHC: 33 g/dL (ref 30.0–36.0)
MCV: 104.1 fL — ABNORMAL HIGH (ref 80.0–100.0)
Monocytes Absolute: 1.4 K/uL — ABNORMAL HIGH (ref 0.1–1.0)
Monocytes Relative: 5 %
Neutro Abs: 24.4 K/uL — ABNORMAL HIGH (ref 1.7–7.7)
Neutrophils Relative %: 83 %
Platelet Count: 184 K/uL (ref 150–400)
RBC: 4.1 MIL/uL — ABNORMAL LOW (ref 4.22–5.81)
RDW: 21.5 % — ABNORMAL HIGH (ref 11.5–15.5)
WBC Count: 29.5 K/uL — ABNORMAL HIGH (ref 4.0–10.5)
nRBC: 0.6 % — ABNORMAL HIGH (ref 0.0–0.2)

## 2021-07-04 LAB — IRON AND IRON BINDING CAPACITY (CC-WL,HP ONLY)
Iron: 40 ug/dL — ABNORMAL LOW (ref 45–182)
Saturation Ratios: 13 % — ABNORMAL LOW (ref 17.9–39.5)
TIBC: 308 ug/dL (ref 250–450)
UIBC: 268 ug/dL (ref 117–376)

## 2021-07-04 LAB — FERRITIN: Ferritin: 81 ng/mL (ref 24–336)

## 2021-07-04 LAB — SAVE SMEAR(SSMR), FOR PROVIDER SLIDE REVIEW

## 2021-07-04 LAB — LACTATE DEHYDROGENASE: LDH: 525 U/L — ABNORMAL HIGH (ref 98–192)

## 2021-07-04 NOTE — Progress Notes (Signed)
Hematology and Oncology Follow Up Visit  Justin [name] [id number] [birth year] 66 y.o. 07/04/2021   Principle Diagnosis:  Essential thrombocythemia, JAK2 V617 mutation+; high risk (age >105) - Previous patient of Dr. 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  We last saw him back in March.  Since then, he has been doing pretty well.  He is busy with his Doberman puppy.  He is now [age]-year-old.  He has had back issues.  He sees Dr. Lynann Bologna of Orthopedic Surgery on Monday.  Sounds like he may need to have surgery.  From my point of view, I see no problems with him having surgery.  Justin [name] has had no problems with the Hydrea.  He has been no nausea or vomiting.  He has had no problems with diarrhea.  He has had issues with his skin.  He has squamous cell cancer on the skin.  He goes to see his dermatologist.  He has had these burned off.  He has had no cough or shortness of breath.  He has had no issues with COVID.  He has had no problems with bleeding.  His last iron studies back in March showed a ferritin of 92 with an iron saturation of 26%.  Overall, I would have to say that his performance status is ECOG 1.    Medications:  Allergies as of 07/04/2021       Reactions   Codeine Swelling   Tolerates hydrocodone, tramadol, and oxycodone    Dilaudid [hydromorphone] Anxiety   Hallucinations    Doxycycline Itching, Rash        Medication List        Accurate  as of July 04, 2021  1:04 PM. If you have any questions, ask your nurse or doctor.          albuterol 108 (90 Base) MCG/ACT inhaler Commonly known as: VENTOLIN HFA Inhale into the lungs.   BLUE-EMU PAIN RELIEF DRY EX Apply 1 application topically daily as needed (pain).   Centrum Adults Tabs Take 1 capsule by mouth daily.   Cholecalciferol 125 MCG (5000 UT) capsule Take 5,000 Units by mouth daily.   cyclobenzaprine 10 MG tablet Commonly known as: FLEXERIL Take 10 mg by mouth 3 (three) times daily.   docusate sodium 100 MG capsule Commonly known as: COLACE Take 1 capsule (100 mg total) by mouth 2 (two) times daily.   Eliquis 5 MG Tabs tablet Generic drug: apixaban Take 5 mg by mouth 2 (two) times daily.   ferrous sulfate 325 (65 FE) MG tablet Take 1 tablet (325 mg total) by mouth 2 (two) times daily with a meal.   furosemide 20 MG tablet Commonly known as: LASIX Take 20 mg by mouth daily as needed for fluid.   gabapentin 300 MG capsule Commonly known as: NEURONTIN Take 300 mg by mouth 3 (three) times daily.   HYDROcodone-acetaminophen 7.5-325 MG tablet Commonly known as: NORCO Take 1-2 tablets by mouth every 4 (four) hours as needed for moderate pain.  hydroxyurea 500 MG capsule Commonly known as: HYDREA TAKE 1 CAPSULE BY MOUTH 2 TIMES DAILY. MAY TAKE WITH FOOD TO MINIMIZE GI SIDE EFFECTS.   loratadine 10 MG tablet Commonly known as: CLARITIN Take 10 mg by mouth daily.   metoprolol succinate 25 MG 24 hr tablet Commonly known as: TOPROL-XL Take 25 mg by mouth 2 (two) times daily.   pantoprazole 40 MG tablet Commonly known as: PROTONIX TAKE 1 TABLET BY MOUTH EVERY DAY   potassium chloride 10 MEQ CR capsule Commonly known as: MICRO-K Take 10 mEq by mouth daily as needed (take when taking lasix).   pregabalin 50 MG capsule Commonly known as: LYRICA Take 50 mg by mouth 3 (three) times daily. Patient is only taking one tablet daily   REFRESH TEARS  OP Place 1-2 drops into both eyes daily as needed (dry eyes).   rosuvastatin 20 MG tablet Commonly known as: CRESTOR TAKE 1 TABLET BY MOUTH EVERY DAY IN THE EVENING   senna-docusate 8.6-50 MG tablet Commonly known as: Senokot-S Take 1 tablet by mouth 2 (two) times daily.   tamsulosin 0.4 MG Caps capsule Commonly known as: FLOMAX Take 1 capsule (0.4 mg total) by mouth daily.   zolpidem 10 MG tablet Commonly known as: AMBIEN Take 10 mg by mouth at bedtime.        Allergies:  Allergies  Allergen Reactions   Codeine Swelling    Tolerates hydrocodone, tramadol, and oxycodone    Dilaudid [Hydromorphone] Anxiety    Hallucinations    Doxycycline Itching and Rash    Past Medical History, Surgical history, Social history, and Family History were reviewed and updated.  Review of Systems: Review of Systems  Constitutional: Negative.   HENT: Negative.    Eyes: Negative.   Respiratory: Negative.    Cardiovascular: Negative.   Gastrointestinal: Negative.   Genitourinary: Negative.   Musculoskeletal:  Positive for back pain and joint pain.  Skin: Negative.   Neurological: Negative.   Endo/Heme/Allergies: Negative.   Psychiatric/Behavioral: Negative.       Physical Exam:  weight is 173 lb (78.5 kg). His oral temperature is 98.1 F (36.7 C). His blood pressure is 112/69 and his pulse is 72. His respiration is 19 and oxygen saturation is 94%.   Wt Readings from Last 3 Encounters:  07/04/21 173 lb (78.5 kg)  03/27/21 171 lb 1.9 oz (77.6 kg)  02/14/21 170 lb (77.1 kg)    Physical Exam Vitals reviewed.  HENT:     Head: Normocephalic and atraumatic.  Eyes:     Pupils: Pupils are equal, round, and reactive to light.  Cardiovascular:     Rate and Rhythm: Normal rate and regular rhythm.     Heart sounds: Normal heart sounds.  Pulmonary:     Effort: Pulmonary effort is normal.     Breath sounds: Normal breath sounds.  Abdominal:     General: Bowel sounds are normal.      Palpations: Abdomen is soft.  Musculoskeletal:        General: No tenderness or deformity. Normal range of motion.     Cervical back: Normal range of motion.  Lymphadenopathy:     Cervical: No cervical adenopathy.  Skin:    General: Skin is warm and dry.     Findings: No erythema or rash.  Neurological:     Mental Status: He is alert and oriented to person, place, and time.  Psychiatric:        Behavior: Behavior normal.  Thought Content: Thought content normal.        Judgment: Judgment normal.      Lab Results  Component Value Date   WBC 29.5 (H) 07/04/2021   HGB 14.1 07/04/2021   HCT 42.7 07/04/2021   MCV 104.1 (H) 07/04/2021   PLT 184 07/04/2021   Lab Results  Component Value Date   FERRITIN 92 03/27/2021   IRON 81 03/27/2021   TIBC 308 03/27/2021   UIBC 227 03/27/2021   IRONPCTSAT 26 03/27/2021   Lab Results  Component Value Date   RETICCTPCT 5.4 (H) 08/13/2018   RBC 4.10 (L) 07/04/2021   No results found for: "KPAFRELGTCHN", "LAMBDASER", "KAPLAMBRATIO" No results found for: "IGGSERUM", "IGA", "IGMSERUM" No results found for: "TOTALPROTELP", "ALBUMINELP", "A1GS", "A2GS", "BETS", "BETA2SER", "GAMS", "MSPIKE", "SPEI"   Chemistry      Component Value Date/Time   NA 135 07/04/2021 1133   K 4.2 07/04/2021 1133   CL 99 07/04/2021 1133   CO2 28 07/04/2021 1133   BUN 10 07/04/2021 1133   CREATININE 0.51 (L) 07/04/2021 1133      Component Value Date/Time   CALCIUM 9.6 07/04/2021 1133   ALKPHOS 129 (H) 07/04/2021 1133   AST 26 07/04/2021 1133   ALT 10 07/04/2021 1133   BILITOT 1.2 07/04/2021 1133       Impression and Plan:  Justin [name] is a very pleasant 66 yo caucasian gentleman with essential thrombocythemia, JAK2 V617 mutation +, high risk (age > 69).   I looked at his blood smear the microscope.  He does have leukocytosis.  He has the predominance of hypersegmented polys.  I saw a couple of eosinophils and basophils.  There may been 1 or 2  immature myeloid cells.  I do not see any blasts.  Platelets were adequate in number and size.  Platelets were well granulated.  His red cell showed no nucleated red cells.  Again, he is on Hydrea.  His white cell count is high but improving a little bit.  We will have to watch this.  I do not see that we have to do a bone marrow biopsy as of yet.  We will plan to get him back in 3-4 months.  I think this would be reasonable.     Volanda Napoleon, MD 6/16/20231:04 PM

## 2021-07-04 NOTE — Telephone Encounter (Signed)
Per 07/04/21 los - gave upcoming appointments - confirmed

## 2021-07-05 ENCOUNTER — Emergency Department (HOSPITAL_BASED_OUTPATIENT_CLINIC_OR_DEPARTMENT_OTHER)
Admission: EM | Admit: 2021-07-05 | Discharge: 2021-07-05 | Disposition: A | Discharge status: Home / Self Care | Payer: Medicare Other | Attending: Emergency Medicine | Admitting: Emergency Medicine

## 2021-07-05 ENCOUNTER — Encounter (HOSPITAL_BASED_OUTPATIENT_CLINIC_OR_DEPARTMENT_OTHER): Payer: Self-pay | Admitting: Emergency Medicine

## 2021-07-05 ENCOUNTER — Other Ambulatory Visit: Payer: Self-pay

## 2021-07-05 DIAGNOSIS — Z7901 Long term (current) use of anticoagulants: Secondary | ICD-10-CM | POA: Insufficient documentation

## 2021-07-05 DIAGNOSIS — R040 Epistaxis: Secondary | ICD-10-CM | POA: Insufficient documentation

## 2021-07-05 NOTE — ED Notes (Signed)
Patient given discharge instructions, all questions answered. Patient in possession of all belongings, directed to the discharge area  

## 2021-07-05 NOTE — ED Provider Notes (Signed)
[facility] DE[facility] Provider Note   CSN: [id number] Arrival date & time: 07/05/21  1040     History  Chief Complaint  Patient presents with   Epistaxis    Justin [name] is a 66 y.o. male who presents to the ED complaining of epistaxis onset last night. Pt is on eliquis. He notes that he has had a deviated septum and noted that he has had epistaxis in the past. No meds tried PTA. Denies shortness of breath, palpitation, nausea, vomiting.   The history is provided by the patient. No language interpreter was used.       Home Medications Prior to Admission medications   Medication Sig Start Date End Date Taking? Authorizing Provider  albuterol (VENTOLIN HFA) 108 (90 Base) MCG/ACT inhaler Inhale into the lungs. 11/11/19   [provider]  Carboxymethylcellulose Sodium (REFRESH TEARS OP) Place 1-2 drops into both eyes daily as needed (dry eyes).    [provider]  Cholecalciferol 125 MCG (5000 UT) capsule Take 5,000 Units by mouth daily.     [provider]  cyclobenzaprine (FLEXERIL) 10 MG tablet Take 10 mg by mouth 3 (three) times daily.     [provider]  docusate sodium (COLACE) 100 MG capsule Take 1 capsule (100 mg total) by mouth 2 (two) times daily. 10/12/19   Meuth, Brooke A, PA-C  ELIQUIS 5 MG TABS tablet Take 5 mg by mouth 2 (two) times daily. 11/29/19   [provider]  ferrous sulfate 325 (65 FE) MG tablet Take 1 tablet (325 mg total) by mouth 2 (two) times daily with a meal. 08/13/18   Kayleen Memos, DO  furosemide (LASIX) 20 MG tablet Take 20 mg by mouth daily as needed for fluid. 04/17/18   [provider]  gabapentin (NEURONTIN) 300 MG capsule Take 300 mg by mouth 3 (three) times daily.    [provider]  HYDROcodone-acetaminophen (NORCO) 7.5-325 MG tablet Take 1-2 tablets by mouth every 4 (four) hours as needed for moderate pain.  03/28/19   [provider]  hydroxyurea  (HYDREA) 500 MG capsule TAKE 1 CAPSULE BY MOUTH 2 TIMES DAILY. MAY TAKE WITH FOOD TO MINIMIZE GI SIDE EFFECTS. 03/18/21   Volanda Napoleon, MD  Lidocaine ([place]-EMU PAIN RELIEF DRY EX) Apply 1 application topically daily as needed (pain).    [provider]  loratadine (CLARITIN) 10 MG tablet Take 10 mg by mouth daily.    [provider]  metoprolol succinate (TOPROL-XL) 25 MG 24 hr tablet Take 25 mg by mouth 2 (two) times daily.    [provider]  Multiple Vitamins-Minerals (CENTRUM ADULTS) TABS Take 1 capsule by mouth daily.    [provider]  pantoprazole (PROTONIX) 40 MG tablet TAKE 1 TABLET BY MOUTH EVERY DAY Patient taking differently: Take 40 mg by mouth daily. 08/14/19   Cirigliano, Vito V, DO  potassium chloride (MICRO-K) 10 MEQ CR capsule Take 10 mEq by mouth daily as needed (take when taking lasix).    [provider]  pregabalin (LYRICA) 50 MG capsule Take 50 mg by mouth 3 (three) times daily. Patient is only taking one tablet daily    [provider]  rosuvastatin (CRESTOR) 20 MG tablet TAKE 1 TABLET BY MOUTH EVERY DAY IN THE EVENING 01/29/20   Buford Dresser, MD  senna-docusate (SENOKOT-S) 8.6-50 MG tablet Take 1 tablet by mouth 2 (two) times daily. 10/12/19   Meuth, Brooke A, PA-C  tamsulosin (FLOMAX) 0.4 MG  CAPS capsule Take 1 capsule (0.4 mg total) by mouth daily. 10/13/19   Meuth, Brooke A, PA-C  zolpidem (AMBIEN) 10 MG tablet Take 10 mg by mouth at bedtime.     [provider]      Allergies    Codeine, Dilaudid [hydromorphone], and Doxycycline    Review of Systems   Review of Systems  HENT:  Positive for nosebleeds.   Respiratory:  Negative for shortness of breath.   Cardiovascular:  Negative for palpitations.  Gastrointestinal:  Negative for nausea and vomiting.  All other systems reviewed and are negative.   Physical Exam Updated Vital Signs BP (!) 148/91 (BP Location: Right Arm)   Pulse 91   Temp  97.8 F (36.6 C) (Temporal)   Resp 18   Ht '5\' 8"'$  (1.727 m)   Wt 75.8 kg   SpO2 99%   BMI 25.39 kg/m  Physical Exam Vitals and nursing note reviewed.  Constitutional:      General: He is not in acute distress.    Appearance: He is not diaphoretic.  HENT:     Head: Normocephalic and atraumatic.     Nose:     Right Nostril: No epistaxis.     Left Nostril: Epistaxis present.     Comments: Right nare without bleeding noted. Left nare with clot and bleeding noted.     Mouth/Throat:     Pharynx: No oropharyngeal exudate.     Comments: Blood noted in posterior oropharynx. Patent airway, uvula midline without swelling.  Eyes:     General: No scleral icterus.    Conjunctiva/sclera: Conjunctivae normal.  Cardiovascular:     Rate and Rhythm: Normal rate and regular rhythm.     Pulses: Normal pulses.     Heart sounds: Normal heart sounds.  Pulmonary:     Effort: Pulmonary effort is normal. No respiratory distress.     Breath sounds: Normal breath sounds. No wheezing.  Abdominal:     General: Bowel sounds are normal.     Palpations: Abdomen is soft. There is no mass.     Tenderness: There is no abdominal tenderness. There is no guarding or rebound.  Musculoskeletal:        General: Normal range of motion.     Cervical back: Normal range of motion and neck supple.  Skin:    General: Skin is warm and dry.  Neurological:     Mental Status: He is alert.  Psychiatric:        Behavior: Behavior normal.     ED Results / Procedures / Treatments   Labs (all labs ordered are listed, but only abnormal results are displayed) Labs Reviewed - No data to display  EKG None  Radiology No results found.  Procedures Procedures    Medications Ordered in ED Medications - No data to display  ED Course/ Medical Decision Making/ A&P Clinical Course as of 07/05/21 1553  Sat Jul 05, 2021  1443 Patient reevaluated after 25 minutes of holding compressions to nose.  No bleeding noted to  left nare.  Patient to ambulate. [SB]  1447 Pt ambulated without recurrent epistaxis. Discussed discharge treatment plan.  [SB]    Clinical Course User Index [SB] Haston Casebolt A, PA-C                           Medical Decision Making  Patient presents to the ED with epistaxis since last night.  Takes eliquis at this time.  No shortness of breath, palpitations noted. Vital signs stable, patient afebrile. On exam patient with blood noted in posterior oropharynx. Patent airway, uvula midline without swelling.  Right nare without bleeding noted. Left nare with clot and bleeding noted. No acute cardiovascular, respiratory, abdominal exam findings.  Differential diagnosis includes dry nasal mucous membranes, trauma, polyp.    Additional history obtained:  Additional history obtained from Spouse/Significant Other   Disposition: Presentation suspicious for epistaxis in the setting of anticoagulants and not applying compression properly. Doubt trauma as cause of epistaxis. Doubt dry nasal mucous membranes as cause of presentation. Doubt polyp, no appreciable polyp on nasal exam. Patient educated on proper technique to apply pressure to nose in the ED. After consideration of the diagnostic results and the patients response to treatment, I feel that the patient would benefit from Discharge home. Discussed with patient and family member importance of following up with ENT specialist for further evaluation and management.  Discussed with patient to hold anticoagulants today and resume tomorrow. Pt to follow up with primary care provider as needed. Supportive care measures and strict return precautions discussed with patient at bedside. Pt acknowledges and verbalizes understanding. Pt appears safe for discharge. Follow up as indicated in discharge paperwork.   This chart was dictated using voice recognition software, Dragon. Despite the best efforts of this provider to proofread and correct errors, errors may  still occur which can change documentation meaning.  Final Clinical Impression(s) / ED Diagnoses Final diagnoses:  Epistaxis    Rx / DC Orders ED Discharge Orders     None         Hershell Brandl A, PA-C 07/05/21 1553    Isla Pence, MD 07/06/21 [phone]

## 2021-07-05 NOTE — Discharge Instructions (Signed)
It was a pleasure taking care of you today!   Ensure to sit forward while applying pressure to the soft portion of your nose for 20 minutes. Attached is further information with pictures on how to apply pressure to your nose to stop the bleeding if it occurs again. Attached is information for the on call ENT specialist, call and set up a follow up appointment.  Hold your Eliquis today, you may resume it tomorrow. You may follow up with your primary care provider as needed. Return to the ED if you experience increasing/worsening nosebleeds, lightheadedness, chest pain, shortness of breath.

## 2021-07-05 NOTE — ED Notes (Signed)
Ambulated pt down the hall, no bleeding from nose.

## 2021-07-05 NOTE — ED Triage Notes (Signed)
Pt with bloody nose since last night. Pt has hx of the same presumably due to deviated septum, states it usually stops after a while. Pt reports that this time is has not stopped. He is on eliquis. Pt alert & oriented, nad noted.

## 2021-07-07 ENCOUNTER — Ambulatory Visit (HOSPITAL_BASED_OUTPATIENT_CLINIC_OR_DEPARTMENT_OTHER): Payer: Medicare Other | Admitting: Cardiology

## 2021-07-07 DIAGNOSIS — M4696 Unspecified inflammatory spondylopathy, lumbar region: Secondary | ICD-10-CM | POA: Diagnosis not present

## 2021-07-07 DIAGNOSIS — M5451 Vertebrogenic low back pain: Secondary | ICD-10-CM | POA: Diagnosis not present

## 2021-07-07 NOTE — Progress Notes (Incomplete)
Cardiology Office Note:    Date:  07/07/2021   ID:  Justin [name], DOB March 31, 1955, MRN [id number]  PCP:  [name], MD  Cardiologist:  Buford Dresser, MD PhD  Referring MD: [name], MD   CC: follow up  History of Present Illness:    Justin [name] is a 66 y.o. male with a hx of obesity, diabetes, prior tobacco use, hypertension, peripheral vascular disease who is seen in follow up for atrial fibrillation and s/p R CEA and CABG with AVR 01/03/18 for bicuspid aortic valve stenosis. My initial visit with him was on 12/07/17.   Pertinent PMH -atrial fibrillation diagnosed 2019 -R CEA, CABG, AVR (bicuspid valve) 12/2017. -Admission 08/13/2018 for severe anemia 2/2 gastric ulcers -Admission 10/04/2019 for fall, ankle fracture, RP bleed, hemoperitoneum, hemothorax -PAD: 30 mmHg difference between arms, known stenotic disease on dopplers from 12/2017. Noted to have severely dampened flow throughout. L brachial pressures also noted as severely reduced from R arm at the time of dopplers. Remains with claudication, though trying to walk through it. R leg with numbness from vein harvest.  -S/P THA 12/24/2018 -essential thrombocythemia/JAK mutation, followed by Dr. Zhao/Dr. Marin Olp, on hydrea  At his last appointment, he was only taking Lyrica once daily instead of his prescribed amount of 3 times daily. He was also limited by bilateral hip and spine pain, but continued to walk regularly.  Today: ***  He denies any palpitations, chest pain, shortness of breath, or peripheral edema. No lightheadedness, headaches, syncope, orthopnea, or PND.  (+)  Past Medical History:  Diagnosis Date   Acute meniscal tear of knee LEFT   Anemia    Aortic stenosis 12/01/2017   NONRHEUMATIC, AORTIC VALVE CALCIFICATIONS, MILD TO MODERATE REGURG, MILD TO MODERATE CALCIFIED ANNULUS per ECHO 10/25/17 @ [facility] [address]   Arthritis    Atrial fibrillation ([place]) 11/12/2017   AT O/V WITH  PCP   Cancer ([place])    skin right arm   Coronary artery disease    DM (diabetes mellitus) ([place])    Heart murmur MILD-- ASYMPTOMATIC   History of kidney stones    Hyperlipidemia    Hypertension    Left knee pain    PAD (peripheral artery disease) ([facility])    left leg claudication    Past Surgical History:  Procedure Laterality Date   AORTIC VALVE REPLACEMENT N/A 01/03/2018   Procedure: AORTIC VALVE REPLACEMENT (AVR) using 35m Magna Ease Bioprosthesis Aortic Valve;  Surgeon: VIvin Poot MD;  [facility]  Service: Open Heart Surgery;  Laterality: N/A;   APPENDECTOMY  1998   BIOPSY  08/11/2018   Procedure: BIOPSY;  Surgeon: NMauri Pole MD;  [facility]: [facility]  Service: Endoscopy;;   CATARACT EXTRACTION W/ INTRAOCULAR LENS  IMPLANT, BILATERAL  1998/  2000   CERVICAL FUSION  1985   C4 - 5   CORONARY ARTERY BYPASS GRAFT N/A 01/03/2018   Procedure: CORONARY ARTERY BYPASS GRAFTING (CABG) x 4 (LIMA to LAD, SVG to DIAGONAL, SVG to RAMUS INTERMEDIATE, and SVG to PDA), USING LEFT INFTERNAL MAMMARY ARTERY AND;  Surgeon: VPrescott Gum PCollier Salina MD;  [facility] [facility]  Service: Open Heart Surgery;  Laterality: N/A;   ENDARTERECTOMY Right 01/03/2018   Procedure: ENDARTERECTOMY CAROTID;  Surgeon: CMarty Heck MD;  [facility];  Service: Vascular;  Laterality: Right;   ESOPHAGOGASTRODUODENOSCOPY (EGD) WITH PROPOFOL N/A 08/11/2018   Procedure: ESOPHAGOGASTRODUODENOSCOPY (EGD) WITH PROPOFOL;  Surgeon: NMauri Pole MD;  [place] [facility]  Service:  Endoscopy;  Laterality: N/A;   KNEE ARTHROSCOPY W/ MENISCECTOMY  1991  1987   LEFT KNEE   x 2   NASAL SINUS SURGERY  1982   ORIF ANKLE FRACTURE Right 10/07/2019   Procedure: OPEN REDUCTION INTERNAL FIXATION (ORIF) ANKLE FRACTURE;  Surgeon: Hiram Gash, MD;  Location: [facility];  Service: Orthopedics;  Laterality: Right;  Regional RIGHT  ankle block as well.   RIGHT/LEFT HEART CATH AND CORONARY ANGIOGRAPHY N/A 12/24/2017    Procedure: RIGHT/LEFT HEART CATH AND CORONARY ANGIOGRAPHY;  Surgeon: Jolaine Artist, MD;  Location: [facility];  Service: Cardiovascular;  Laterality: N/A;   ROTATOR CUFF REPAIR  09-04-2005   LEFT SHOULDER   TEE WITHOUT CARDIOVERSION N/A 12/24/2017   Procedure: TRANSESOPHAGEAL ECHOCARDIOGRAM (TEE);  Surgeon: Jolaine Artist, MD;  Location: [facility];  Service: Cardiovascular;  Laterality: N/A;   TEE WITHOUT CARDIOVERSION N/A 01/03/2018   Procedure: TRANSESOPHAGEAL ECHOCARDIOGRAM (TEE);  Surgeon: Prescott Gum, Collier Salina, MD;  Location: [facility];  Service: Open Heart Surgery;  Laterality: N/A;   TONSILLECTOMY  AGE 64   TOTAL HIP ARTHROPLASTY Right 12/23/2018   Procedure: TOTAL HIP ARTHROPLASTY ANTERIOR APPROACH;  Surgeon: Paralee Cancel, MD;  Location: [facility];  Service: Orthopedics;  Laterality: Right;  70 mins    Current Medications: Current Outpatient Medications on File Prior to Visit  Medication Sig   albuterol (VENTOLIN HFA) 108 (90 Base) MCG/ACT inhaler Inhale into the lungs.   Carboxymethylcellulose Sodium (REFRESH TEARS OP) Place 1-2 drops into both eyes daily as needed (dry eyes).   Cholecalciferol 125 MCG (5000 UT) capsule Take 5,000 Units by mouth daily.    cyclobenzaprine (FLEXERIL) 10 MG tablet Take 10 mg by mouth 3 (three) times daily.    docusate sodium (COLACE) 100 MG capsule Take 1 capsule (100 mg total) by mouth 2 (two) times daily.   ELIQUIS 5 MG TABS tablet Take 5 mg by mouth 2 (two) times daily.   ferrous sulfate 325 (65 FE) MG tablet Take 1 tablet (325 mg total) by mouth 2 (two) times daily with a meal.   furosemide (LASIX) 20 MG tablet Take 20 mg by mouth daily as needed for fluid.   gabapentin (NEURONTIN) 300 MG capsule Take 300 mg by mouth 3 (three) times daily.   HYDROcodone-acetaminophen (NORCO) 7.5-325 MG tablet Take 1-2 tablets by mouth every 4 (four) hours as needed for moderate pain.    hydroxyurea (HYDREA) 500 MG capsule TAKE 1 CAPSULE BY MOUTH 2 TIMES  DAILY. MAY TAKE WITH FOOD TO MINIMIZE GI SIDE EFFECTS.   Lidocaine (BLUE-EMU PAIN RELIEF DRY EX) Apply 1 application topically daily as needed (pain).   loratadine (CLARITIN) 10 MG tablet Take 10 mg by mouth daily.   metoprolol succinate (TOPROL-XL) 25 MG 24 hr tablet Take 25 mg by mouth 2 (two) times daily.   Multiple Vitamins-Minerals (CENTRUM ADULTS) TABS Take 1 capsule by mouth daily.   pantoprazole (PROTONIX) 40 MG tablet TAKE 1 TABLET BY MOUTH EVERY DAY (Patient taking differently: Take 40 mg by mouth daily.)   potassium chloride (MICRO-K) 10 MEQ CR capsule Take 10 mEq by mouth daily as needed (take when taking lasix).   pregabalin (LYRICA) 50 MG capsule Take 50 mg by mouth 3 (three) times daily. Patient is only taking one tablet daily   rosuvastatin (CRESTOR) 20 MG tablet TAKE 1 TABLET BY MOUTH EVERY DAY IN THE EVENING   senna-docusate (SENOKOT-S) 8.6-50 MG tablet Take 1 tablet by mouth 2 (two) times daily.  tamsulosin (FLOMAX) 0.4 MG CAPS capsule Take 1 capsule (0.4 mg total) by mouth daily.   zolpidem (AMBIEN) 10 MG tablet Take 10 mg by mouth at bedtime.    No current facility-administered medications on file prior to visit.    Allergies:   Codeine, Dilaudid [hydromorphone], and Doxycycline   Social History   Tobacco Use   Smoking status: Former    Packs/day: 0.25    Years: 23.00    Total pack years: 5.75    Types: Cigars, Cigarettes    Quit date: 01/02/2018    Years since quitting: 3.5   Smokeless tobacco: Never   Tobacco comments:    quit cigs in 2011 and smokes cigars daily- from 3-10 cigars-09/27/13  Vaping Use   Vaping Use: Never used  Substance Use Topics   Alcohol use: Not Currently    Alcohol/week: 0.0 standard drinks of alcohol    Comment: occasional   Drug use: No    Comment: occasionally    Family History: Family history unknown as he was adopted.  ROS:   Please see the history of present illness.    Additional pertinent ROS otherwise  unremarkable.  EKGs/Labs/Other Studies Reviewed:    The following studies were reviewed today:  Carotid Duplex 08/13/20: Right Carotid: Velocities in the right ICA are consistent with a 1-39%  stenosis. The ECA appears occluded.  Left Carotid: Very dampened flow noted in the CCA. Bidirectional flow  noted in the ICA suggesting a distal occlusion. ECA is patent with low-resistant flow.  Vertebrals:  Bilateral vertebral arteries demonstrate antegrade flow.  Subclavians: Left subclavian artery was stenotic. Normal flow hemodynamics  were seen in the right subclavian artery.  Echo 08/01/18  1. The left ventricle has normal systolic function with an ejection fraction of 60-65%. The cavity size was normal. There is mildly increased left ventricular wall thickness. Left ventricular diastolic Doppler parameters are indeterminate.  2. The right ventricle has normal systolic function.  3. Left atrial size was moderately dilated.  4. A 57m an EBig Lotsvalve is present in the aortic position. Procedure Date: 01/03/18.  5. Aortic valve prosthesis appears to open well.  Echo 10/25/17 - Left ventricle: Wall thickness was increased in a pattern of mild   LVH. Systolic function was mildly to moderately reduced. The   estimated ejection fraction was in the range of 40% to 45%. - Aortic valve: Mildly to moderately calcified annulus. Mildly   thickened, moderately calcified leaflets. There was mild to   moderate regurgitation. Valve area (VTI): 0.93 cm^2. Valve area   (Vmax): 0.93 cm^2. Valve area (Vmean): 0.9 cm^2. - Left atrium: The atrium was mildly dilated. - Right atrium: The atrium was mildly dilated.  TEE 12/24/17 - Left ventricle: The estimated ejection fraction was 55%. - Aortic valve: Trileaflet; moderately calcified leaflets; fusion   of the left-noncoronary commissure. There was moderate stenosis. - Mitral valve: No evidence of vegetation. There was mild to   moderate  regurgitation. - Left atrium: The atrium was dilated. No evidence of thrombus in   the atrial cavity or appendage. - Right atrium: The atrium was dilated. - Atrial septum: No defect or patent foramen ovale was identified. - Tricuspid valve: No evidence of vegetation. - Pulmonic valve: No evidence of vegetation.  [facility] to Prox RCA lesion is 95% stenosed. Prox LAD lesion is 95% stenosed. Mid RCA lesion is 80% stenosed. Dist RCA lesion is 99% stenosed. Post Atrio lesion is 50% stenosed. Prox Cx  to Mid Cx lesion is 80% stenosed. Mid LM to Dist LM lesion is 50% stenosed. Ost 1st Diag lesion is 90% stenosed. There is moderate aortic valve stenosis.   Findings:   RA = 6 RV = 34/5 PA = 34/9 (20) PCW = 21 Fick cardiac output/index = 5.5/2.7 PVR = < 1.0 WU Ao sat = 97% PA sat = 73%, 74%   Assessment:  1. Moderate aortic stenosis 2. Severe 3v CAD 3. Normal LV function 4. Relatively normal hemodynamics  Plan/Discussion:  Films reviewed with Dr. Prescott Gum. Plan AVR/CABG +/- Maze with Dr. Prescott Gum next week.   Surgery 01/03/18 Procedure (s):  1.  Coronary artery bypass grafting x4 (left internal mammary artery to left anterior descending, saphenous vein graft to diagonal, saphenous vein graft to OM1, saphenous vein graft to posterior descending). 2.  Aortic valve replacement with a 23 mm Edwards pericardial valve, serial [id number]. 3.  Application of left atrial clip, AtriCure 35 mm. 4.  Endoscopic harvest of right leg greater saphenous vein by Dr. Prescott Gum on 01/03/2018.  Also R CEA with bovine patch angioplasty  EKG:  EKG is personally reviewed.   07/07/2021:  *** 01/06/21: atrial fibrillation at 98 bpm 07/08/20: atrial fibrillation at 67 bpm  Recent Labs: 07/04/2021: ALT 10; BUN 10; Creatinine 0.51; Hemoglobin 14.1; Platelet Count 184; Potassium 4.2; Sodium 135   Recent Lipid Panel    Component Value Date/Time   CHOL 127 02/17/2018 1014   TRIG 80  02/17/2018 1014   HDL 41 02/17/2018 1014   CHOLHDL 3.1 02/17/2018 1014   VLDL 16 02/17/2018 1014   LDLCALC 70 02/17/2018 1014    Physical Exam:    VS:  There were no vitals taken for this visit.    Wt Readings from Last 3 Encounters:  07/05/21 167 lb (75.8 kg)  07/04/21 173 lb (78.5 kg)  03/27/21 171 lb 1.9 oz (77.6 kg)    GEN: Well nourished, well developed in no acute distress HEENT: Normal, moist mucous membranes NECK: No JVD CARDIAC: ***irregularly irregular, normal S1 and S2, no rubs or gallops. ***2/6 systolic murmur. VASCULAR: Radial and DP pulses 2+ bilaterally. RESPIRATORY:  Clear to auscultation without rales, wheezing or rhonchi  ABDOMEN: Soft, non-tender, non-distended MUSCULOSKELETAL:  Ambulates independently SKIN: Warm and dry, no edema NEUROLOGIC:  Alert and oriented x 3. No focal neuro deficits noted. PSYCHIATRIC:  Normal affect    ASSESSMENT:    No diagnosis found.    PLAN:    S/P R CEA, CAD s/p CABG x4, AVR (for bicuspid AV stenosis) 12/2017:  -continue metoprolol succinate 25 mg daily. -was on 81 mg aspirin for valve and CAD. Has been stopped given bleeds -continue rosuvastatin 20 mg.  -had infection on jardiance. Consider GLP1RA for diabetes/ASCVD benefit -has endocarditis prophylaxis antibiotics (amoxicillin) though has dentures -echo with EF 60-65%, valve functioning well  Atrial fibrillation, permanent: goal of rate control -chadsvasc=3, s/p LA clip. His polycythemia also places him at higher risk of clot as well.  -Continue rate control with metoprolol, anticoagulation with apixaban  PAD: no claudication currently -no longer smoking. -statin as above -followed by vascular for PAD, carotid disease  Hypertension: at goal, avoid hypotension give PAD pressure differences -continue metoprolol  Hyperlipidemia, mixed: goal LDL <70 -LDL 63, TG 144 09/13/20 -continue rosuvastatin 20 mg  Follow up: ***6 months  Medication Adjustments/Labs and  Tests Ordered: Current medicines are reviewed at length with the patient today.  Concerns regarding medicines are outlined above.  No orders of the defined types were placed in this encounter.  No orders of the defined types were placed in this encounter.  There are no Patient Instructions on file for this visit.   I,Mathew Stumpf,acting as a Education administrator for [employer], MD.,have documented all relevant documentation on the behalf of Buford Dresser, MD,as directed by  Buford Dresser, MD while in the presence of Buford Dresser, MD.  I, Madelin Rear, have reviewed all documentation for this visit. The documentation on 07/07/21 for the exam, diagnosis, procedures, and orders are all accurate and complete.   Signed, Buford Dresser, MD PhD 07/07/2021    [place]

## 2021-08-14 ENCOUNTER — Encounter (HOSPITAL_BASED_OUTPATIENT_CLINIC_OR_DEPARTMENT_OTHER): Payer: Self-pay | Admitting: Cardiology

## 2021-08-14 ENCOUNTER — Ambulatory Visit (HOSPITAL_BASED_OUTPATIENT_CLINIC_OR_DEPARTMENT_OTHER): Payer: Medicare Other | Admitting: Cardiology

## 2021-08-14 VITALS — BP 122/64 | HR 87 | Ht 68.0 in | Wt 171.4 lb

## 2021-08-14 DIAGNOSIS — Z9889 Other specified postprocedural states: Secondary | ICD-10-CM | POA: Diagnosis not present

## 2021-08-14 DIAGNOSIS — R5383 Other fatigue: Secondary | ICD-10-CM | POA: Diagnosis not present

## 2021-08-14 DIAGNOSIS — I4821 Permanent atrial fibrillation: Secondary | ICD-10-CM | POA: Diagnosis not present

## 2021-08-14 DIAGNOSIS — I2510 Atherosclerotic heart disease of native coronary artery without angina pectoris: Secondary | ICD-10-CM

## 2021-08-14 DIAGNOSIS — I6523 Occlusion and stenosis of bilateral carotid arteries: Secondary | ICD-10-CM

## 2021-08-14 DIAGNOSIS — I739 Peripheral vascular disease, unspecified: Secondary | ICD-10-CM

## 2021-08-14 DIAGNOSIS — I2583 Coronary atherosclerosis due to lipid rich plaque: Secondary | ICD-10-CM

## 2021-08-14 DIAGNOSIS — Z952 Presence of prosthetic heart valve: Secondary | ICD-10-CM

## 2021-08-14 DIAGNOSIS — Z951 Presence of aortocoronary bypass graft: Secondary | ICD-10-CM

## 2021-08-14 NOTE — Patient Instructions (Signed)
Medication Instructions:  Your Physician recommend you continue on your current medication as directed.    *If you need a refill on your cardiac medications before your next appointment, please call your pharmacy*   Lab Work: Your provider has recommended lab work today. Please have this collected at [facility] [place]. The lab is open 8:00 am - 4:30 pm. Please avoid 12:00p - 1:00p for lunch hour. You do not need an appointment. Please go to [address] [place], [place] [address]. This is in the Primary Care office on the 3rd floor, let them know you are there for blood work and they will direct you to the lab.  If you have labs (blood work) drawn today and your tests are completely normal, you will receive your results only by: [place] (if you have MyChart) OR A paper copy in the mail If you have any lab test that is abnormal or we need to change your treatment, we will call you to review the results.   Testing/Procedures: None ordered today   Follow-Up: At [facility], you and your health needs are our priority.  As part of our continuing mission to provide you with exceptional heart care, we have created designated Provider Care Teams.  These Care Teams include your primary Cardiologist (physician) and Advanced Practice Providers (APPs -  Physician Assistants and Nurse Practitioners) who all work together to provide you with the care you need, when you need it.  We recommend signing up for the patient portal called "MyChart".  Sign up information is provided on this After Visit Summary.  MyChart is used to connect with patients for Virtual Visits (Telemedicine).  Patients are able to view lab/test results, encounter notes, upcoming appointments, etc.  Non-urgent messages can be sent to your provider as well.   To learn more about what you can do with MyChart, go to [email or web address].    Your next appointment:   6 month(s)  The format  for your next appointment:   In Person  Provider:   Buford Dresser, MD{

## 2021-08-14 NOTE — Progress Notes (Signed)
Cardiology Office Note:    Date:  08/14/2021   ID:  Justin [name], DOB 1955-06-16, MRN [id number]  PCP:  [name], MD  Cardiologist:  Buford Dresser, MD PhD  Referring MD: [name], MD   CC: follow up  History of Present Illness:    Justin [name] is a 66 y.o. male with a hx of obesity, diabetes, prior tobacco use, hypertension, peripheral vascular disease who is seen in follow up for atrial fibrillation and s/p R CEA and CABG with AVR 01/03/18 for bicuspid aortic valve stenosis. My initial visit with him was on 12/07/17.   Pertinent PMH -atrial fibrillation diagnosed 2019 -R CEA, CABG, AVR (bicuspid valve) 12/2017. -Admission 08/13/2018 for severe anemia 2/2 gastric ulcers -Admission 10/04/2019 for fall, ankle fracture, RP bleed, hemoperitoneum, hemothorax -PAD: 30 mmHg difference between arms, known stenotic disease on dopplers from 12/2017. Noted to have severely dampened flow throughout. L brachial pressures also noted as severely reduced from R arm at the time of dopplers. Remains with claudication, though trying to walk through it. R leg with numbness from vein harvest.  -S/P THA 12/24/2018 -essential thrombocythemia/JAK mutation, followed by Dr. Zhao/Dr. Marin Olp, on hydrea  Today: Here with is wife today. Biggest issue is worsening chronic back pain. Saw someone with [employer], discussing facet injections. Still hoping to have left hip replacement in the future.  Has felt more fatigued the last few weeks. Wasn't eating/drinking well, feels like he doesn't have any stamina. Had not left the house for 10 days. Had low blood pressure, short of breath with walking to the mailbox. Appears pale to me today. Reviewed labs from 07/04/21. No melena/hematochezia/hematuria. Had similar episode about 2 years ago, needed chemotherapy adjusted. Weight typically fluctuates 162-177 lbs, hasn't been over 170 lbs on his home scale. No diarrhea/nausea/vomiting.  Has not needed lasix in some time. Has also been sleeping a lot more than usual.  Has labs pending from Dr. Marin Olp but not due to see until September.  Denies chest pain. No PND, orthopnea, LE edema or unexpected weight gain. No syncope or palpitations.   Past Medical History:  Diagnosis Date   Acute meniscal tear of knee LEFT   Anemia    Aortic stenosis 12/01/2017   NONRHEUMATIC, AORTIC VALVE CALCIFICATIONS, MILD TO MODERATE REGURG, MILD TO MODERATE CALCIFIED ANNULUS per ECHO 10/25/17 @ [facility] [address]   Arthritis    Atrial fibrillation ([place]) 11/12/2017   AT O/V WITH PCP   Cancer ([place])    skin right arm   Coronary artery disease    DM (diabetes mellitus) ([place])    Heart murmur MILD-- ASYMPTOMATIC   History of kidney stones    Hyperlipidemia    Hypertension    Left knee pain    PAD (peripheral artery disease) ([facility])    left leg claudication    Past Surgical History:  Procedure Laterality Date   AORTIC VALVE REPLACEMENT N/A 01/03/2018   Procedure: AORTIC VALVE REPLACEMENT (AVR) using 89m Magna Ease Bioprosthesis Aortic Valve;  Surgeon: VIvin Poot MD;  [facility]  Service: Open Heart Surgery;  Laterality: N/A;   APPENDECTOMY  1998   BIOPSY  08/11/2018   Procedure: BIOPSY;  Surgeon: NMauri Pole MD;  [facility] [facility]  Service: Endoscopy;;   CATARACT EXTRACTION W/ INTRAOCULAR LENS  IMPLANT, BILATERAL  1998/  2000   CERVICAL FUSION  1985   C4 - 5   CORONARY ARTERY BYPASS GRAFT N/A 01/03/2018   Procedure: CORONARY ARTERY BYPASS  GRAFTING (CABG) x 4 (LIMA to LAD, SVG to DIAGONAL, SVG to RAMUS INTERMEDIATE, and SVG to PDA), USING LEFT INFTERNAL MAMMARY ARTERY AND;  Surgeon: Prescott Gum, Collier Salina, MD;  Location: [place];  Service: Open Heart Surgery;  Laterality: N/A;   ENDARTERECTOMY Right 01/03/2018   Procedure: ENDARTERECTOMY CAROTID;  Surgeon: Marty Heck, MD;  Location: [facility] OR;  Service: Vascular;  Laterality: Right;   ESOPHAGOGASTRODUODENOSCOPY  (EGD) WITH PROPOFOL N/A 08/11/2018   Procedure: ESOPHAGOGASTRODUODENOSCOPY (EGD) WITH PROPOFOL;  Surgeon: Mauri Pole, MD;  Location: [place];  Service: Endoscopy;  Laterality: N/A;   KNEE ARTHROSCOPY W/ MENISCECTOMY  1991  1987   LEFT KNEE   x 2   NASAL SINUS SURGERY  1982   ORIF ANKLE FRACTURE Right 10/07/2019   Procedure: OPEN REDUCTION INTERNAL FIXATION (ORIF) ANKLE FRACTURE;  Surgeon: Hiram Gash, MD;  Location: [place];  Service: Orthopedics;  Laterality: Right;  Regional RIGHT  ankle block as well.   RIGHT/LEFT HEART CATH AND CORONARY ANGIOGRAPHY N/A 12/24/2017   Procedure: RIGHT/LEFT HEART CATH AND CORONARY ANGIOGRAPHY;  Surgeon: Jolaine Artist, MD;  Location: [place] [facility];  Service: Cardiovascular;  Laterality: N/A;   ROTATOR CUFF REPAIR  09-04-2005   LEFT SHOULDER   TEE WITHOUT CARDIOVERSION N/A 12/24/2017   Procedure: TRANSESOPHAGEAL ECHOCARDIOGRAM (TEE);  Surgeon: Jolaine Artist, MD;  Location: [facility] ENDOSCOPY;  Service: Cardiovascular;  Laterality: N/A;   TEE WITHOUT CARDIOVERSION N/A 01/03/2018   Procedure: TRANSESOPHAGEAL ECHOCARDIOGRAM (TEE);  Surgeon: Prescott Gum, Collier Salina, MD;  Location: [place];  Service: Open Heart Surgery;  Laterality: N/A;   TONSILLECTOMY  AGE 60   TOTAL HIP ARTHROPLASTY Right 12/23/2018   Procedure: TOTAL HIP ARTHROPLASTY ANTERIOR APPROACH;  Surgeon: Paralee Cancel, MD;  Location: [facility];  Service: Orthopedics;  Laterality: Right;  70 mins    Current Medications: Current Outpatient Medications on File Prior to Visit  Medication Sig   albuterol (VENTOLIN HFA) 108 (90 Base) MCG/ACT inhaler Inhale into the lungs.   Carboxymethylcellulose Sodium (REFRESH TEARS OP) Place 1-2 drops into both eyes daily as needed (dry eyes).   Cholecalciferol 125 MCG (5000 UT) capsule Take 5,000 Units by mouth daily.    cyclobenzaprine (FLEXERIL) 10 MG tablet Take 10 mg by mouth 3 (three) times daily.    docusate sodium (COLACE) 100 MG capsule Take 1 capsule  (100 mg total) by mouth 2 (two) times daily.   ELIQUIS 5 MG TABS tablet Take 5 mg by mouth 2 (two) times daily.   ferrous sulfate 325 (65 FE) MG tablet Take 1 tablet (325 mg total) by mouth 2 (two) times daily with a meal.   furosemide (LASIX) 20 MG tablet Take 20 mg by mouth daily as needed for fluid.   gabapentin (NEURONTIN) 300 MG capsule Take 300 mg by mouth 3 (three) times daily.   HYDROcodone-acetaminophen (NORCO) 7.5-325 MG tablet Take 1-2 tablets by mouth every 4 (four) hours as needed for moderate pain.    hydroxyurea (HYDREA) 500 MG capsule TAKE 1 CAPSULE BY MOUTH 2 TIMES DAILY. MAY TAKE WITH FOOD TO MINIMIZE GI SIDE EFFECTS.   Lidocaine (BLUE-EMU PAIN RELIEF DRY EX) Apply 1 application topically daily as needed (pain).   loratadine (CLARITIN) 10 MG tablet Take 10 mg by mouth daily.   metoprolol succinate (TOPROL-XL) 25 MG 24 hr tablet Take 25 mg by mouth 2 (two) times daily.   Multiple Vitamins-Minerals (CENTRUM ADULTS) TABS Take 1 capsule by mouth daily.   pantoprazole (PROTONIX) 40 MG  tablet TAKE 1 TABLET BY MOUTH EVERY DAY (Patient taking differently: Take 40 mg by mouth daily.)   potassium chloride (MICRO-K) 10 MEQ CR capsule Take 10 mEq by mouth daily as needed (take when taking lasix).   pregabalin (LYRICA) 50 MG capsule Take 50 mg by mouth 3 (three) times daily. Patient is only taking one tablet daily   rosuvastatin (CRESTOR) 20 MG tablet TAKE 1 TABLET BY MOUTH EVERY DAY IN THE EVENING   senna-docusate (SENOKOT-S) 8.6-50 MG tablet Take 1 tablet by mouth 2 (two) times daily.   tamsulosin (FLOMAX) 0.4 MG CAPS capsule Take 1 capsule (0.4 mg total) by mouth daily.   zolpidem (AMBIEN) 10 MG tablet Take 10 mg by mouth at bedtime.    No current facility-administered medications on file prior to visit.    Allergies:   Codeine, Dilaudid [hydromorphone], and Doxycycline   Social History   Tobacco Use   Smoking status: Former    Packs/day: 0.25    Years: 23.00    Total pack years:  5.75    Types: Cigars, Cigarettes    Quit date: 01/02/2018    Years since quitting: 3.6   Smokeless tobacco: Never   Tobacco comments:    quit cigs in 2011 and smokes cigars daily- from 3-10 cigars-09/27/13  Vaping Use   Vaping Use: Never used  Substance Use Topics   Alcohol use: Not Currently    Alcohol/week: 0.0 standard drinks of alcohol    Comment: occasional   Drug use: No    Comment: occasionally    Family History: Family history unknown as he was adopted.  ROS:   Please see the history of present illness.   Additional pertinent ROS otherwise unremarkable.  EKGs/Labs/Other Studies Reviewed:    The following studies were reviewed today: Carotid Duplex 08/13/20: Right Carotid: Velocities in the right ICA are consistent with a 1-39%  stenosis. The ECA appears occluded.  Left Carotid: Very dampened flow noted in the CCA. Bidirectional flow  noted in the ICA suggesting a distal occlusion. ECA is patent with low-resistant flow.  Vertebrals:  Bilateral vertebral arteries demonstrate antegrade flow.  Subclavians: Left subclavian artery was stenotic. Normal flow hemodynamics  were seen in the right subclavian artery.  Echo 08/01/18  1. The left ventricle has normal systolic function with an ejection fraction of 60-65%. The cavity size was normal. There is mildly increased left ventricular wall thickness. Left ventricular diastolic Doppler parameters are indeterminate.  2. The right ventricle has normal systolic function.  3. Left atrial size was moderately dilated.  4. A 25m an EBig Lotsvalve is present in the aortic position. Procedure Date: 01/03/18.  5. Aortic valve prosthesis appears to open well.  Echo 10/25/17 - Left ventricle: Wall thickness was increased in a pattern of mild   LVH. Systolic function was mildly to moderately reduced. The   estimated ejection fraction was in the range of 40% to 45%. - Aortic valve: Mildly to moderately calcified annulus.  Mildly   thickened, moderately calcified leaflets. There was mild to   moderate regurgitation. Valve area (VTI): 0.93 cm^2. Valve area   (Vmax): 0.93 cm^2. Valve area (Vmean): 0.9 cm^2. - Left atrium: The atrium was mildly dilated. - Right atrium: The atrium was mildly dilated.  TEE 12/24/17 - Left ventricle: The estimated ejection fraction was 55%. - Aortic valve: Trileaflet; moderately calcified leaflets; fusion   of the left-noncoronary commissure. There was moderate stenosis. - Mitral valve: No evidence of vegetation. There was mild  to   moderate regurgitation. - Left atrium: The atrium was dilated. No evidence of thrombus in   the atrial cavity or appendage. - Right atrium: The atrium was dilated. - Atrial septum: No defect or patent foramen ovale was identified. - Tricuspid valve: No evidence of vegetation. - Pulmonic valve: No evidence of vegetation.  [facility] 12/24/17 Ost RCA to Prox RCA lesion is 95% stenosed. Prox LAD lesion is 95% stenosed. Mid RCA lesion is 80% stenosed. Dist RCA lesion is 99% stenosed. Post Atrio lesion is 50% stenosed. Prox Cx to Mid Cx lesion is 80% stenosed. Mid LM to Dist LM lesion is 50% stenosed. Ost 1st Diag lesion is 90% stenosed. There is moderate aortic valve stenosis.   Findings:   RA = 6 RV = 34/5 PA = 34/9 (20) PCW = 21 Fick cardiac output/index = 5.5/2.7 PVR = < 1.0 WU Ao sat = 97% PA sat = 73%, 74%   Assessment:  1. Moderate aortic stenosis 2. Severe 3v CAD 3. Normal LV function 4. Relatively normal hemodynamics  Plan/Discussion:  Films reviewed with Dr. Prescott Gum. Plan AVR/CABG +/- Maze with Dr. Prescott Gum next week.   Surgery 01/03/18 Procedure (s):  1.  Coronary artery bypass grafting x4 (left internal mammary artery to left anterior descending, saphenous vein graft to diagonal, saphenous vein graft to OM1, saphenous vein graft to posterior descending). 2.  Aortic valve replacement with a 23 mm Edwards pericardial valve,  serial [id number]. 3.  Application of left atrial clip, AtriCure 35 mm. 4.  Endoscopic harvest of right leg greater saphenous vein by Dr. Prescott Gum on 01/03/2018.  Also R CEA with bovine patch angioplasty  EKG:  EKG is personally reviewed.   08/14/21 afib at 87 bpm 01/06/21: atrial fibrillation at 98 bpm 07/08/20: atrial fibrillation at 67 bpm  Recent Labs: 07/04/2021: ALT 10; BUN 10; Creatinine 0.51; Hemoglobin 14.1; Platelet Count 184; Potassium 4.2; Sodium 135  Recent Lipid Panel    Component Value Date/Time   CHOL 127 02/17/2018 1014   TRIG 80 02/17/2018 1014   HDL 41 02/17/2018 1014   CHOLHDL 3.1 02/17/2018 1014   VLDL 16 02/17/2018 1014   LDLCALC 70 02/17/2018 1014    Physical Exam:    VS:  BP 122/64 (BP Location: Left Arm, Patient Position: Sitting, Cuff Size: Normal)   Pulse 87   Ht 5' 8"  (1.727 m)   Wt 171 lb 6.4 oz (77.7 kg)   BMI 26.06 kg/m     Wt Readings from Last 3 Encounters:  08/14/21 171 lb 6.4 oz (77.7 kg)  07/05/21 167 lb (75.8 kg)  07/04/21 173 lb (78.5 kg)    GEN: Well nourished, well developed in no acute distress. Pale appearance HEENT: Normal, moist mucous membranes NECK: No JVD CARDIAC: irregularly irregular rhythm, normal S1 and S2, no rubs or gallops. 2/6 systolic ejection murmur. VASCULAR: Radial and DP pulses 2+ bilaterally. No carotid bruits RESPIRATORY:  Clear to auscultation without rales, wheezing or rhonchi  ABDOMEN: Soft, non-tender, non-distended MUSCULOSKELETAL:  Ambulates independently SKIN: Warm and dry, no edema NEUROLOGIC:  Alert and oriented x 3. No focal neuro deficits noted. PSYCHIATRIC:  Normal affect    ASSESSMENT:    1. Other fatigue   2. Permanent atrial fibrillation (HCC)   3. Carotid stenosis, bilateral   4. History of CEA (carotid endarterectomy)   5. Coronary artery disease due to lipid rich plaque   6. S/P CABG x 4   7. S/P AVR (aortic valve replacement)  8. PAD (peripheral artery disease) (HCC)     PLAN:     Fatigue -several weeks, general fatigue, lack of appetite -he appears pale to me and his cheeks are more sunken than usual -denies overt bleeding -will check CBC, CMP today -valve sounds unchanged from prior, no edema -concern this may be inflammatory/infectious, will send ESR/CRP -if it does not improve or if worsens, he will contact his PCP  S/P R CEA, CAD s/p CABG x4, AVR (for bicuspid AV stenosis) 12/2017:  -continue metoprolol succinate 25 mg daily. -was on 81 mg aspirin for valve and CAD. Has been stopped given bleeds -continue rosuvastatin 20 mg.  -had infection on jardiance. Consider GLP1RA for diabetes/ASCVD benefit -has endocarditis prophylaxis antibiotics (amoxicillin) though has dentures -echo with EF 60-65%, valve functioning well  Atrial fibrillation, permanent: goal of rate control -chadsvasc=3, s/p LA clip. His polycythemia also places him at higher risk of clot as well.  -Continue rate control with metoprolol, anticoagulation with apixaban  PAD: no claudication currently -no longer smoking. -statin as above -followed by vascular for PAD, carotid disease  Hypertension: at goal, avoid hypotension give PAD pressure differences -continue metoprolol  Hyperlipidemia, mixed: goal LDL <70 -LDL 63, TG 144 09/13/20 -continue rosuvastatin 20 mg  Follow up: 6 months  Medication Adjustments/Labs and Tests Ordered: Current medicines are reviewed at length with the patient today.  Concerns regarding medicines are outlined above.  Orders Placed This Encounter  Procedures   CBC w/Diff   Comp Met (CMET)   Sedimentation rate   C-reactive protein   EKG 12-Lead    No orders of the defined types were placed in this encounter.    Patient Instructions  Medication Instructions:  Your Physician recommend you continue on your current medication as directed.    *If you need a refill on your cardiac medications before your next appointment, please call your  pharmacy*   Lab Work: Your provider has recommended lab work today. Please have this collected at [facility] [place]. The lab is open 8:00 am - 4:30 pm. Please avoid 12:00p - 1:00p for lunch hour. You do not need an appointment. Please go to [address] [place], [place] [address]. This is in the Primary Care office on the 3rd floor, let them know you are there for blood work and they will direct you to the lab.  If you have labs (blood work) drawn today and your tests are completely normal, you will receive your results only by: [place] (if you have MyChart) OR A paper copy in the mail If you have any lab test that is abnormal or we need to change your treatment, we will call you to review the results.   Testing/Procedures: None ordered today   Follow-Up: At [facility], you and your health needs are our priority.  As part of our continuing mission to provide you with exceptional heart care, we have created designated Provider Care Teams.  These Care Teams include your primary Cardiologist (physician) and Advanced Practice Providers (APPs -  Physician Assistants and Nurse Practitioners) who all work together to provide you with the care you need, when you need it.  We recommend signing up for the patient portal called "MyChart".  Sign up information is provided on this After Visit Summary.  MyChart is used to connect with patients for Virtual Visits (Telemedicine).  Patients are able to view lab/test results, encounter notes, upcoming appointments, etc.  Non-urgent messages can be sent to your  provider as well.   To learn more about what you can do with MyChart, go to [email or web address].    Your next appointment:   6 month(s)  The format for your next appointment:   In Person  Provider:   Buford Dresser, MD{           Signed, Buford Dresser, MD PhD 08/14/2021    [place]

## 2021-08-15 ENCOUNTER — Telehealth (HOSPITAL_BASED_OUTPATIENT_CLINIC_OR_DEPARTMENT_OTHER): Payer: Self-pay | Admitting: Cardiology

## 2021-08-15 LAB — CBC WITH DIFFERENTIAL/PLATELET
Basophils Absolute: 0.8 10*3/uL — ABNORMAL HIGH (ref 0.0–0.2)
Basos: 3 %
EOS (ABSOLUTE): 0.1 10*3/uL (ref 0.0–0.4)
Eos: 0 %
Hematocrit: 24.2 % — ABNORMAL LOW (ref 37.5–51.0)
Hemoglobin: 7.7 g/dL — ABNORMAL LOW (ref 13.0–17.7)
Immature Grans (Abs): 1.3 10*3/uL — ABNORMAL HIGH (ref 0.0–0.1)
Immature Granulocytes: 4 %
Lymphocytes Absolute: 1.6 10*3/uL (ref 0.7–3.1)
Lymphs: 5 %
MCH: 33.8 pg — ABNORMAL HIGH (ref 26.6–33.0)
MCHC: 31.8 g/dL (ref 31.5–35.7)
MCV: 106 fL — ABNORMAL HIGH (ref 79–97)
Monocytes Absolute: 1.3 10*3/uL — ABNORMAL HIGH (ref 0.1–0.9)
Monocytes: 4 %
NRBC: 3 % — ABNORMAL HIGH (ref 0–0)
Neutrophils Absolute: 27.8 10*3/uL — ABNORMAL HIGH (ref 1.4–7.0)
Neutrophils: 84 %
Platelets: 180 10*3/uL (ref 150–450)
RBC: 2.28 x10E6/uL — CL (ref 4.14–5.80)
RDW: 20.4 % — ABNORMAL HIGH (ref 11.6–15.4)
WBC: 32.9 10*3/uL (ref 3.4–10.8)

## 2021-08-15 LAB — C-REACTIVE PROTEIN: CRP: 21 mg/L — ABNORMAL HIGH (ref 0–10)

## 2021-08-15 LAB — COMPREHENSIVE METABOLIC PANEL
ALT: 14 IU/L (ref 0–44)
AST: 37 IU/L (ref 0–40)
Albumin/Globulin Ratio: 1.3 (ref 1.2–2.2)
Albumin: 3.6 g/dL — ABNORMAL LOW (ref 3.9–4.9)
Alkaline Phosphatase: 153 IU/L — ABNORMAL HIGH (ref 44–121)
BUN/Creatinine Ratio: 15 (ref 10–24)
BUN: 15 mg/dL (ref 8–27)
Bilirubin Total: 1.1 mg/dL (ref 0.0–1.2)
CO2: 18 mmol/L — ABNORMAL LOW (ref 20–29)
Calcium: 8.2 mg/dL — ABNORMAL LOW (ref 8.6–10.2)
Chloride: 101 mmol/L (ref 96–106)
Creatinine, Ser: 1.01 mg/dL (ref 0.76–1.27)
Globulin, Total: 2.8 g/dL (ref 1.5–4.5)
Glucose: 123 mg/dL — ABNORMAL HIGH (ref 70–99)
Potassium: 4.3 mmol/L (ref 3.5–5.2)
Sodium: 134 mmol/L (ref 134–144)
Total Protein: 6.4 g/dL (ref 6.0–8.5)
eGFR: 82 mL/min/{1.73_m2} (ref >59)

## 2021-08-15 LAB — SEDIMENTATION RATE: Sed Rate: 17 mm/hr (ref 0–30)

## 2021-08-15 NOTE — Telephone Encounter (Signed)
Called results of labs to patient. Hgb dropped from 14.1 a month ago to 7.7 on these labs. Mild changes in Cr, Alb, Alk phos as well.  Reported findings to patient. He confirms that he has not seen any blood loss, has just had the fatigue that we discussed at his visit.   Will forward results on to Dr. Marin Olp to continue workup. Given his risk of clotting, would continue apixaban for now, but if he sees any evidence of bleeding, instructed him to stop the apixaban and come to the ER.  He will monitor symptoms, understands when he needs to seek emergency care.  Buford Dresser, MD, PhD, [facility] at [facility] at [facility] [address] [place], [place] [address] [phone]

## 2021-08-18 ENCOUNTER — Other Ambulatory Visit: Payer: Self-pay

## 2021-08-18 ENCOUNTER — Encounter: Payer: Self-pay | Admitting: Hematology & Oncology

## 2021-08-18 ENCOUNTER — Inpatient Hospital Stay: Payer: Medicare Other

## 2021-08-18 ENCOUNTER — Inpatient Hospital Stay: Payer: Medicare Other | Admitting: Hematology & Oncology

## 2021-08-18 ENCOUNTER — Inpatient Hospital Stay (HOSPITAL_COMMUNITY): Payer: Medicare Other

## 2021-08-18 ENCOUNTER — Inpatient Hospital Stay (HOSPITAL_COMMUNITY)
Admission: EM | Admit: 2021-08-18 | Discharge: 2021-08-22 | DRG: 378 | Disposition: A | Discharge status: Home / Self Care | Payer: Medicare Other | Attending: Internal Medicine | Admitting: Internal Medicine

## 2021-08-18 VITALS — BP 113/62 | HR 84 | Temp 97.4°F | Resp 20 | Ht 68.0 in | Wt 171.0 lb

## 2021-08-18 DIAGNOSIS — Z885 Allergy status to narcotic agent status: Secondary | ICD-10-CM

## 2021-08-18 DIAGNOSIS — E785 Hyperlipidemia, unspecified: Secondary | ICD-10-CM | POA: Diagnosis present

## 2021-08-18 DIAGNOSIS — Z79899 Other long term (current) drug therapy: Secondary | ICD-10-CM

## 2021-08-18 DIAGNOSIS — D473 Essential (hemorrhagic) thrombocythemia: Secondary | ICD-10-CM | POA: Diagnosis not present

## 2021-08-18 DIAGNOSIS — M549 Dorsalgia, unspecified: Secondary | ICD-10-CM | POA: Diagnosis present

## 2021-08-18 DIAGNOSIS — K760 Fatty (change of) liver, not elsewhere classified: Secondary | ICD-10-CM | POA: Diagnosis present

## 2021-08-18 DIAGNOSIS — Z9842 Cataract extraction status, left eye: Secondary | ICD-10-CM

## 2021-08-18 DIAGNOSIS — G8929 Other chronic pain: Secondary | ICD-10-CM | POA: Diagnosis present

## 2021-08-18 DIAGNOSIS — N400 Enlarged prostate without lower urinary tract symptoms: Secondary | ICD-10-CM | POA: Diagnosis present

## 2021-08-18 DIAGNOSIS — D539 Nutritional anemia, unspecified: Secondary | ICD-10-CM | POA: Diagnosis not present

## 2021-08-18 DIAGNOSIS — I4821 Permanent atrial fibrillation: Secondary | ICD-10-CM | POA: Diagnosis not present

## 2021-08-18 DIAGNOSIS — G629 Polyneuropathy, unspecified: Secondary | ICD-10-CM | POA: Diagnosis not present

## 2021-08-18 DIAGNOSIS — Z981 Arthrodesis status: Secondary | ICD-10-CM

## 2021-08-18 DIAGNOSIS — E1151 Type 2 diabetes mellitus with diabetic peripheral angiopathy without gangrene: Secondary | ICD-10-CM | POA: Diagnosis not present

## 2021-08-18 DIAGNOSIS — D75839 Thrombocytosis, unspecified: Secondary | ICD-10-CM | POA: Diagnosis present

## 2021-08-18 DIAGNOSIS — Z87442 Personal history of urinary calculi: Secondary | ICD-10-CM

## 2021-08-18 DIAGNOSIS — I482 Chronic atrial fibrillation, unspecified: Secondary | ICD-10-CM | POA: Diagnosis not present

## 2021-08-18 DIAGNOSIS — K219 Gastro-esophageal reflux disease without esophagitis: Secondary | ICD-10-CM | POA: Diagnosis present

## 2021-08-18 DIAGNOSIS — D72829 Elevated white blood cell count, unspecified: Secondary | ICD-10-CM | POA: Diagnosis present

## 2021-08-18 DIAGNOSIS — Z881 Allergy status to other antibiotic agents status: Secondary | ICD-10-CM

## 2021-08-18 DIAGNOSIS — Z7901 Long term (current) use of anticoagulants: Secondary | ICD-10-CM

## 2021-08-18 DIAGNOSIS — K449 Diaphragmatic hernia without obstruction or gangrene: Secondary | ICD-10-CM | POA: Diagnosis not present

## 2021-08-18 DIAGNOSIS — E871 Hypo-osmolality and hyponatremia: Secondary | ICD-10-CM | POA: Diagnosis present

## 2021-08-18 DIAGNOSIS — Z951 Presence of aortocoronary bypass graft: Secondary | ICD-10-CM | POA: Diagnosis not present

## 2021-08-18 DIAGNOSIS — I4891 Unspecified atrial fibrillation: Secondary | ICD-10-CM | POA: Insufficient documentation

## 2021-08-18 DIAGNOSIS — Z953 Presence of xenogenic heart valve: Secondary | ICD-10-CM

## 2021-08-18 DIAGNOSIS — Z9841 Cataract extraction status, right eye: Secondary | ICD-10-CM

## 2021-08-18 DIAGNOSIS — K921 Melena: Secondary | ICD-10-CM

## 2021-08-18 DIAGNOSIS — K640 First degree hemorrhoids: Secondary | ICD-10-CM | POA: Diagnosis not present

## 2021-08-18 DIAGNOSIS — D500 Iron deficiency anemia secondary to blood loss (chronic): Secondary | ICD-10-CM | POA: Diagnosis not present

## 2021-08-18 DIAGNOSIS — E876 Hypokalemia: Secondary | ICD-10-CM | POA: Diagnosis present

## 2021-08-18 DIAGNOSIS — K5520 Angiodysplasia of colon without hemorrhage: Secondary | ICD-10-CM | POA: Diagnosis not present

## 2021-08-18 DIAGNOSIS — E872 Acidosis, unspecified: Secondary | ICD-10-CM | POA: Diagnosis present

## 2021-08-18 DIAGNOSIS — I110 Hypertensive heart disease with heart failure: Secondary | ICD-10-CM | POA: Diagnosis present

## 2021-08-18 DIAGNOSIS — K922 Gastrointestinal hemorrhage, unspecified: Principal | ICD-10-CM

## 2021-08-18 DIAGNOSIS — R9431 Abnormal electrocardiogram [ECG] [EKG]: Secondary | ICD-10-CM | POA: Diagnosis not present

## 2021-08-18 DIAGNOSIS — K3189 Other diseases of stomach and duodenum: Secondary | ICD-10-CM | POA: Diagnosis not present

## 2021-08-18 DIAGNOSIS — I2510 Atherosclerotic heart disease of native coronary artery without angina pectoris: Secondary | ICD-10-CM | POA: Diagnosis not present

## 2021-08-18 DIAGNOSIS — D649 Anemia, unspecified: Secondary | ICD-10-CM

## 2021-08-18 DIAGNOSIS — D62 Acute posthemorrhagic anemia: Secondary | ICD-10-CM | POA: Diagnosis present

## 2021-08-18 DIAGNOSIS — I5032 Chronic diastolic (congestive) heart failure: Secondary | ICD-10-CM | POA: Diagnosis present

## 2021-08-18 DIAGNOSIS — Z96641 Presence of right artificial hip joint: Secondary | ICD-10-CM | POA: Diagnosis present

## 2021-08-18 DIAGNOSIS — Z8052 Family history of malignant neoplasm of bladder: Secondary | ICD-10-CM

## 2021-08-18 DIAGNOSIS — Z961 Presence of intraocular lens: Secondary | ICD-10-CM | POA: Diagnosis present

## 2021-08-18 DIAGNOSIS — K5521 Angiodysplasia of colon with hemorrhage: Principal | ICD-10-CM | POA: Diagnosis present

## 2021-08-18 DIAGNOSIS — Z87891 Personal history of nicotine dependence: Secondary | ICD-10-CM

## 2021-08-18 DIAGNOSIS — Z8719 Personal history of other diseases of the digestive system: Secondary | ICD-10-CM | POA: Diagnosis not present

## 2021-08-18 DIAGNOSIS — K2289 Other specified disease of esophagus: Secondary | ICD-10-CM | POA: Diagnosis not present

## 2021-08-18 LAB — CBC WITH DIFFERENTIAL (CANCER CENTER ONLY)
Abs Immature Granulocytes: 1.96 10*3/uL — ABNORMAL HIGH (ref 0.00–0.07)
Band Neutrophils: 0 %
Basophils Absolute: 0.4 10*3/uL — ABNORMAL HIGH (ref 0.0–0.1)
Basophils Relative: 1 %
Eosinophils Absolute: 0 10*3/uL (ref 0.0–0.5)
Eosinophils Relative: 0 %
HCT: 23.4 % — ABNORMAL LOW (ref 39.0–52.0)
Hemoglobin: 7 g/dL — ABNORMAL LOW (ref 13.0–17.0)
Immature Granulocytes: 6 %
Lymphocytes Relative: 4 %
Lymphs Abs: 1.2 10*3/uL (ref 0.7–4.0)
MCH: 33.8 pg (ref 26.0–34.0)
MCHC: 29.9 g/dL — ABNORMAL LOW (ref 30.0–36.0)
MCV: 113 fL — ABNORMAL HIGH (ref 80.0–100.0)
Monocytes Absolute: 1.3 10*3/uL — ABNORMAL HIGH (ref 0.1–1.0)
Monocytes Relative: 4 %
Neutro Abs: 25.4 10*3/uL — ABNORMAL HIGH (ref 1.7–7.7)
Neutrophils Relative %: 84 %
Platelet Count: 164 10*3/uL (ref 150–400)
RBC: 2.07 MIL/uL — ABNORMAL LOW (ref 4.22–5.81)
RDW: 24.3 % — ABNORMAL HIGH (ref 11.5–15.5)
Smear Review: NORMAL
WBC Count: 30.3 10*3/uL — ABNORMAL HIGH (ref 4.0–10.5)
nRBC: 1 /100 WBC — ABNORMAL HIGH
nRBC: 1.4 % — ABNORMAL HIGH (ref 0.0–0.2)

## 2021-08-18 LAB — CBC WITH DIFFERENTIAL/PLATELET
Abs Immature Granulocytes: 0 10*3/uL (ref 0.00–0.07)
Basophils Absolute: 0.6 10*3/uL — ABNORMAL HIGH (ref 0.0–0.1)
Basophils Relative: 2 %
Eosinophils Absolute: 0 10*3/uL (ref 0.0–0.5)
Eosinophils Relative: 0 %
HCT: 23.8 % — ABNORMAL LOW (ref 39.0–52.0)
Hemoglobin: 7.2 g/dL — ABNORMAL LOW (ref 13.0–17.0)
Lymphocytes Relative: 1 %
Lymphs Abs: 0.3 10*3/uL — ABNORMAL LOW (ref 0.7–4.0)
MCH: 34.3 pg — ABNORMAL HIGH (ref 26.0–34.0)
MCHC: 30.3 g/dL (ref 30.0–36.0)
MCV: 113.3 fL — ABNORMAL HIGH (ref 80.0–100.0)
Monocytes Absolute: 1.1 10*3/uL — ABNORMAL HIGH (ref 0.1–1.0)
Monocytes Relative: 4 %
Neutro Abs: 25.9 10*3/uL — ABNORMAL HIGH (ref 1.7–7.7)
Neutrophils Relative %: 93 %
Platelets: 173 10*3/uL (ref 150–400)
RBC: 2.1 MIL/uL — ABNORMAL LOW (ref 4.22–5.81)
RDW: 24.2 % — ABNORMAL HIGH (ref 11.5–15.5)
WBC: 27.9 10*3/uL — ABNORMAL HIGH (ref 4.0–10.5)
nRBC: 1.4 % — ABNORMAL HIGH (ref 0.0–0.2)

## 2021-08-18 LAB — PROTIME-INR
INR: 1.8 — ABNORMAL HIGH (ref 0.8–1.2)
Prothrombin Time: 20.3 seconds — ABNORMAL HIGH (ref 11.4–15.2)

## 2021-08-18 LAB — CMP (CANCER CENTER ONLY)
ALT: 10 U/L (ref 0–44)
AST: 22 U/L (ref 15–41)
Albumin: 3.5 g/dL (ref 3.5–5.0)
Alkaline Phosphatase: 103 U/L (ref 38–126)
Anion gap: 10 (ref 5–15)
BUN: 14 mg/dL (ref 8–23)
CO2: 22 mmol/L (ref 22–32)
Calcium: 9.1 mg/dL (ref 8.9–10.3)
Chloride: 105 mmol/L (ref 98–111)
Creatinine: 0.97 mg/dL (ref 0.61–1.24)
GFR, Estimated: >60 mL/min (ref >60)
Glucose, Bld: 129 mg/dL — ABNORMAL HIGH (ref 70–99)
Potassium: 4.6 mmol/L (ref 3.5–5.1)
Sodium: 137 mmol/L (ref 135–145)
Total Bilirubin: 1.1 mg/dL (ref 0.3–1.2)
Total Protein: 6.5 g/dL (ref 6.5–8.1)

## 2021-08-18 LAB — COMPREHENSIVE METABOLIC PANEL
ALT: 14 U/L (ref 0–44)
AST: 26 U/L (ref 15–41)
Albumin: 2.9 g/dL — ABNORMAL LOW (ref 3.5–5.0)
Alkaline Phosphatase: 101 U/L (ref 38–126)
Anion gap: 12 (ref 5–15)
BUN: 13 mg/dL (ref 8–23)
CO2: 20 mmol/L — ABNORMAL LOW (ref 22–32)
Calcium: 8.7 mg/dL — ABNORMAL LOW (ref 8.9–10.3)
Chloride: 102 mmol/L (ref 98–111)
Creatinine, Ser: 0.97 mg/dL (ref 0.61–1.24)
GFR, Estimated: >60 mL/min (ref >60)
Glucose, Bld: 106 mg/dL — ABNORMAL HIGH (ref 70–99)
Potassium: 4.5 mmol/L (ref 3.5–5.1)
Sodium: 134 mmol/L — ABNORMAL LOW (ref 135–145)
Total Bilirubin: 1 mg/dL (ref 0.3–1.2)
Total Protein: 6.7 g/dL (ref 6.5–8.1)

## 2021-08-18 LAB — TROPONIN I (HIGH SENSITIVITY): Troponin I (High Sensitivity): 26 ng/L — ABNORMAL HIGH (ref <18)

## 2021-08-18 LAB — FERRITIN: Ferritin: 55 ng/mL (ref 24–336)

## 2021-08-18 LAB — RETICULOCYTES
Immature Retic Fract: 37 % — ABNORMAL HIGH (ref 2.3–15.9)
RBC.: 2.05 MIL/uL — ABNORMAL LOW (ref 4.22–5.81)
Retic Count, Absolute: 150.1 10*3/uL (ref 19.0–186.0)
Retic Ct Pct: 7.3 % — ABNORMAL HIGH (ref 0.4–3.1)

## 2021-08-18 LAB — IRON AND IRON BINDING CAPACITY (CC-WL,HP ONLY)
Iron: 34 ug/dL — ABNORMAL LOW (ref 45–182)
Saturation Ratios: 10 % — ABNORMAL LOW (ref 17.9–39.5)
TIBC: 343 ug/dL (ref 250–450)
UIBC: 309 ug/dL (ref 117–376)

## 2021-08-18 LAB — PREPARE RBC (CROSSMATCH)

## 2021-08-18 MED ORDER — FUROSEMIDE 10 MG/ML IJ SOLN
20.0000 mg | INTRAMUSCULAR | Status: AC
  Administered 2021-08-18: 20 mg via INTRAVENOUS
  Filled 2021-08-18: qty 2

## 2021-08-18 MED ORDER — HYDROXYUREA 500 MG PO CAPS
500.0000 mg | ORAL_CAPSULE | Freq: Two times a day (BID) | ORAL | Status: DC
  Administered 2021-08-18 – 2021-08-22 (×7): 500 mg via ORAL
  Filled 2021-08-18 (×9): qty 1

## 2021-08-18 MED ORDER — ROSUVASTATIN CALCIUM 20 MG PO TABS
20.0000 mg | ORAL_TABLET | Freq: Every day | ORAL | Status: DC
  Administered 2021-08-19 – 2021-08-22 (×3): 20 mg via ORAL
  Filled 2021-08-18 (×4): qty 1

## 2021-08-18 MED ORDER — ZOLPIDEM TARTRATE 5 MG PO TABS
10.0000 mg | ORAL_TABLET | Freq: Every day | ORAL | Status: DC
  Administered 2021-08-18 – 2021-08-21 (×3): 10 mg via ORAL
  Filled 2021-08-18 (×4): qty 2

## 2021-08-18 MED ORDER — PANTOPRAZOLE 80MG IVPB - SIMPLE MED
80.0000 mg | Freq: Once | INTRAVENOUS | Status: AC
  Administered 2021-08-18: 80 mg via INTRAVENOUS
  Filled 2021-08-18: qty 80

## 2021-08-18 MED ORDER — ROSUVASTATIN CALCIUM 20 MG PO TABS
20.0000 mg | ORAL_TABLET | Freq: Every day | ORAL | Status: DC
  Filled 2021-08-18: qty 1

## 2021-08-18 MED ORDER — SODIUM CHLORIDE 0.9% IV SOLUTION
Freq: Once | INTRAVENOUS | Status: AC
  Administered 2021-08-19: 30 mL via INTRAVENOUS

## 2021-08-18 MED ORDER — TAMSULOSIN HCL 0.4 MG PO CAPS
0.4000 mg | ORAL_CAPSULE | Freq: Every day | ORAL | Status: DC
  Administered 2021-08-18 – 2021-08-21 (×4): 0.4 mg via ORAL
  Filled 2021-08-18 (×4): qty 1

## 2021-08-18 NOTE — ED Provider Notes (Signed)
[facility] EMERGENCY DEPARTMENT Provider Note   CSN: [id number] Arrival date & time: 08/18/21  1456     History  No chief complaint on file.   Justin [name] is a 66 y.o. male with history of essential thrombocytopenia, A-fib on Eliquis presenting with fatigue.  Patient reports 3 weeks of fatigue, dyspnea on exertion, black stools.  He denies any vomiting, hematemesis, hematochezia, syncope, nosebleed, hematuria.  Denies headaches.  He denies any similar episodes in the past.  He saw his hematologist today, who performed a rectal exam which is positive for melena, and referred to the emergency department for further evaluation.  HPI     Home Medications Prior to Admission medications   Medication Sig Start Date End Date Taking? Authorizing Provider  albuterol (VENTOLIN HFA) 108 (90 Base) MCG/ACT inhaler Inhale into the lungs. 11/11/19   [provider]  Carboxymethylcellulose Sodium (REFRESH TEARS OP) Place 1-2 drops into both eyes daily as needed (dry eyes).    [provider]  Cholecalciferol 125 MCG (5000 UT) capsule Take 5,000 Units by mouth daily.     [provider]  cyclobenzaprine (FLEXERIL) 10 MG tablet Take 10 mg by mouth 3 (three) times daily.     [provider]  docusate sodium (COLACE) 100 MG capsule Take 1 capsule (100 mg total) by mouth 2 (two) times daily. 10/12/19   Meuth, Brooke A, PA-C  ELIQUIS 5 MG TABS tablet Take 5 mg by mouth 2 (two) times daily. 11/29/19   [provider]  ferrous sulfate 325 (65 FE) MG tablet Take 1 tablet (325 mg total) by mouth 2 (two) times daily with a meal. 08/13/18   Kayleen Memos, DO  furosemide (LASIX) 20 MG tablet Take 20 mg by mouth daily as needed for fluid. 04/17/18   [provider]  gabapentin (NEURONTIN) 300 MG capsule Take 300 mg by mouth 3 (three) times daily.    [provider]  HYDROcodone-acetaminophen (NORCO) 7.5-325 MG tablet Take 1-2  tablets by mouth every 4 (four) hours as needed for moderate pain.  03/28/19   [provider]  hydroxyurea (HYDREA) 500 MG capsule TAKE 1 CAPSULE BY MOUTH 2 TIMES DAILY. MAY TAKE WITH FOOD TO MINIMIZE GI SIDE EFFECTS. 03/18/21   Volanda Napoleon, MD  Lidocaine (BLUE-EMU PAIN RELIEF DRY EX) Apply 1 application topically daily as needed (pain).    [provider]  loratadine (CLARITIN) 10 MG tablet Take 10 mg by mouth daily.    [provider]  metoprolol succinate (TOPROL-XL) 25 MG 24 hr tablet Take 25 mg by mouth 2 (two) times daily.    [provider]  Multiple Vitamins-Minerals (CENTRUM ADULTS) TABS Take 1 capsule by mouth daily.    [provider]  pantoprazole (PROTONIX) 40 MG tablet TAKE 1 TABLET BY MOUTH EVERY DAY Patient taking differently: Take 40 mg by mouth daily. 08/14/19   Cirigliano, Vito V, DO  potassium chloride (MICRO-K) 10 MEQ CR capsule Take 10 mEq by mouth daily as needed (take when taking lasix).    [provider]  pregabalin (LYRICA) 50 MG capsule Take 50 mg by mouth 3 (three) times daily. Patient is only taking one tablet daily    [provider]  rosuvastatin (CRESTOR) 20 MG tablet TAKE 1 TABLET BY MOUTH EVERY DAY IN THE EVENING 01/29/20   Buford Dresser, MD  senna-docusate (SENOKOT-S) 8.6-50 MG tablet Take 1 tablet by mouth 2 (two) times daily. 10/12/19   Meuth,  Brooke A, PA-C  tamsulosin (FLOMAX) 0.4 MG CAPS capsule Take 1 capsule (0.4 mg total) by mouth daily. 10/13/19   Meuth, Brooke A, PA-C  zolpidem (AMBIEN) 10 MG tablet Take 10 mg by mouth at bedtime.     [provider]      Allergies    Codeine, Dilaudid [hydromorphone], and Doxycycline    Review of Systems   Review of Systems See HPI Physical Exam Updated Vital Signs BP (!) 141/62   Pulse 87   Temp (!) 97.5 F (36.4 C) (Oral)   Resp 18   SpO2 99%  Physical Exam Vitals and nursing note reviewed.  Constitutional:      General: He  is not in acute distress.    Appearance: Normal appearance.  HENT:     Mouth/Throat:     Mouth: Mucous membranes are moist.  Cardiovascular:     Rate and Rhythm: Normal rate and regular rhythm.  Pulmonary:     Effort: No respiratory distress.     Breath sounds: Normal breath sounds.     Comments: Mild increased work of breathing Abdominal:     General: Abdomen is flat.     Palpations: Abdomen is soft.     Tenderness: There is no abdominal tenderness.  Skin:    General: Skin is warm and dry.     Capillary Refill: Capillary refill takes less than 2 seconds.     Coloration: Skin is pale.  Neurological:     Mental Status: He is alert and oriented to person, place, and time. Mental status is at baseline.  Psychiatric:        Mood and Affect: Mood normal.        Behavior: Behavior normal.     ED Results / Procedures / Treatments   Labs (all labs ordered are listed, but only abnormal results are displayed) Labs Reviewed  CBC WITH DIFFERENTIAL/PLATELET - Abnormal; Notable for the following components:      Result Value   WBC 27.9 (*)    RBC 2.10 (*)    Hemoglobin 7.2 (*)    HCT 23.8 (*)    MCV 113.3 (*)    MCH 34.3 (*)    RDW 24.2 (*)    nRBC 1.4 (*)    Neutro Abs 25.9 (*)    Lymphs Abs 0.3 (*)    Monocytes Absolute 1.1 (*)    Basophils Absolute 0.6 (*)    All other components within normal limits  COMPREHENSIVE METABOLIC PANEL - Abnormal; Notable for the following components:   Sodium 134 (*)    CO2 20 (*)    Glucose, Bld 106 (*)    Calcium 8.7 (*)    Albumin 2.9 (*)    All other components within normal limits  PROTIME-INR - Abnormal; Notable for the following components:   Prothrombin Time 20.3 (*)    INR 1.8 (*)    All other components within normal limits  HIV ANTIBODY (ROUTINE TESTING W REFLEX)  TYPE AND SCREEN  PREPARE RBC (CROSSMATCH)  TROPONIN I (HIGH SENSITIVITY)    EKG EKG Interpretation  Date/Time:  Monday August 18 2021 16:10:34 EDT Ventricular  Rate:  84 PR Interval:    QRS Duration: 100 QT Interval:  382 QTC Calculation: 451 R Axis:   61 Text Interpretation: Atrial fibrillation Possible Inferior infarct , age undetermined Abnormal ECG When compared with ECG of 04-Oct-2019 16:26, Inferior T wave changes Confirmed by Garnette Gunner [phone]) on 08/18/2021 7:00:51 PM  Radiology No results  found.  Procedures Procedures    Medications Ordered in ED Medications  pantoprazole (PROTONIX) 80 mg /NS 100 mL IVPB (80 mg Intravenous New Bag/Given 08/18/21 2006)  0.9 %  sodium chloride infusion (Manually program via Guardrails IV Fluids) (has no administration in time range)  hydroxyurea (HYDREA) capsule 500 mg (has no administration in time range)  tamsulosin (FLOMAX) capsule 0.4 mg (has no administration in time range)    ED Course/ Medical Decision Making/ A&P Clinical Course as of 08/18/21 2016  Mon Aug 18, 2021  1933 Discussed with Dr. Lorenso Courier who will consult for GI, likely scope.  [WS]    Clinical Course User Index [WS] Cristie Hem, MD                           Medical Decision Making Amount and/or Complexity of Data Reviewed Labs: ordered.  Risk Prescription drug management. Decision regarding hospitalization.   66 year old male presenting to the emergency department with fatigue.  Labs interpreted by me notable for anemia to 7.2.  Patient pale on exam with mild dyspnea.  Suspect upper GI bleeding.  Will give IV PPI.  Patient does not have known history of cirrhosis so will defer further antibiotics or octreotide.  Discussed with GI consultant who will perform endoscopy.  Patient consulted for blood, and will give blood transfusion. Hemodynamically stable, will defer reversal at this time.  Discussed with hospitalist who admit the patient.  I reviewed patient's record from hematologist which was today.         Final Clinical Impression(s) / ED Diagnoses Final diagnoses:  Upper GI bleeding   Symptomatic anemia    Rx / DC Orders ED Discharge Orders     None         Cristie Hem, MD 08/18/21 2020

## 2021-08-18 NOTE — Progress Notes (Signed)
I am seeing Mr. Deman today for unscheduled visit.  Apparently, he saw his cardiologist last week.  She found that he was quite anemic.  She felt that we were more capable of taking care of the problem.  Over the weekend, he has had no obvious bleeding.  He has had dark stools.  He is on Eliquis.  I think this is a real problem that we have.  He is on Eliquis.  He has not yet stopped the Eliquis.  I do not stop the Eliquis today.  He is not eating as much.  He has had no obvious bleeding.  He has a atrial fibrillation.  Today, his hemoglobin is 7.  White cell count is 30.3.  Platelet count 164,000.  I did do a rectal exam on him.  The rectal exam did show dark stool that was grossly heme positive.  He has had no fever.  He has had no abdominal pain.  He has had no problems with urine.  He has had a little bit of leg swelling.  He has had some shortness of breath.  He mostly feels very fatigued.  His vital signs show temperature of 97.4.  Pulse 84.  Blood pressure 113/62.  Weight is 171 pounds.  His lungs are clear bilaterally.  Cardiac exam regular rate and rhythm with occasional extra beat.  His abdominal exam is soft.  He has no guarding or rebound tenderness.  He has no palpable liver or spleen tip.  Again, his rectal exam showed dark stool.  There is no mass in the rectal vault.  Stool was heme positive.  Extremity shows some mild swelling in the legs.  Neurological exam is nonfocal.  Unfortunately, Mr. Grunewald will need to be admitted.  He has been on a monitor.  With the atrial fibrillation, he is going to have to be monitored.  I am sure cardiology will follow-up with him.  I do not think that this bleeding has anything to do with his hematologic issue which is essential thrombocythemia.  His platelet count is not high.  He is on Hydrea.  To date, he has never had any kind of bleeding issues from the thrombocythemia.  I do not know if the Eliquis needs to be reversed.  I am sure that he  will need to be seen by gastroenterology for a upper and lower endoscopy.  His last upper endoscopy was back 3 years ago.  It looks like he has some small ulcers in the gastric fundus.  I suspect he probably will also need to be transfused.  From my point of view, I do not see a problem with him being transfused.  We will try to follow-up with him over at [facility].  I do appreciate them being able to take him.  He needs to be monitored because of the fibrillation and the fact that his can be off anticoagulation.  I am sure cardiology will decide when to get him back onto anticoagulation.     Lattie Haw, MD

## 2021-08-18 NOTE — H&P (Signed)
History and Physical  Justin [name] FXO:[id number] DOB: 10-Sep-1955 DOA: 08/18/2021  Referring physician: Dr. Truett Mainland, EDP  PCP: [name], MD  Outpatient Specialists: Cardiology, hematology/oncology. Patient coming from: Home.  Chief Complaint: Generalized fatigue, black stools.  HPI: Justin [name] is a 66 y.o. male with medical history significant for essential thrombocythemia on hydroxyurea, permanent atrial fibrillation on Eliquis, history of nonbleeding gastric ulcers seen on EGD done in 2020, bilateral carotid stenosis status post CEA, coronary artery disease status post CABG, status post aortic valve replacement, peripheral artery disease, GERD, BPH, Essential pretension, hyperlipidemia, polyneuropathy, squamous cell cancer of the skin follows with dermatology, who presented to [facility] ED at the recommendation of his hematologist oncologist Dr. Marin Olp due to symptomatic anemia as evidenced by generalized fatigue, hemoglobin of 7.0, black stools and positive FOBT.  Endorses 3 weeks of progressively worsening fatigue with intermittent black stools.  No prior history of cirrhosis.  He is on daily 40 mg Protonix along with daily Eliquis for primary CVA prophylaxis for his atrial fibrillation.  Denies vomiting, hematemesis, hematochezia, hematuria or syncope.  In the ED, 2 units PRBC were ordered to be transfused and the patient was started on Protonix drip.  EDP discussed this case with GI Melvyn Neth who will see in consultation.  N.p.o. after midnight for possible endoscopy.  The patient was admitted by [facility], hospitalist service.  ED Course: Tmax 98.5.  BP 145/73, pulse 86, respiration rate 23, with saturation 99% on room air.  Lab studies remarkable for serum sodium 134, serum bicarb 20, glucose 106, albumin 2.9.  Calcium 8.7.  WBC 27.9, hemoglobin 7.2, MCV 113, platelet 173.  Neutrophil count 25.9.  Lymphocyte 0.3, monocyte 1.1.  Basophil 0.6.  Review of Systems: Review of systems as  noted in the HPI. All other systems reviewed and are negative.   Past Medical History:  Diagnosis Date   Acute meniscal tear of knee LEFT   Anemia    Aortic stenosis 12/01/2017   NONRHEUMATIC, AORTIC VALVE CALCIFICATIONS, MILD TO MODERATE REGURG, MILD TO MODERATE CALCIFIED ANNULUS per ECHO 10/25/17 @ [facility] [address]   Arthritis    Atrial fibrillation ([place]) 11/12/2017   AT O/V WITH PCP   Cancer ([place])    skin right arm   Coronary artery disease    DM (diabetes mellitus) ([place])    Heart murmur MILD-- ASYMPTOMATIC   History of kidney stones    Hyperlipidemia    Hypertension    Left knee pain    PAD (peripheral artery disease) ([facility])    left leg claudication   Past Surgical History:  Procedure Laterality Date   AORTIC VALVE REPLACEMENT N/A 01/03/2018   Procedure: AORTIC VALVE REPLACEMENT (AVR) using 21m Magna Ease Bioprosthesis Aortic Valve;  Surgeon: VIvin Poot MD;  [facility]  Service: Open Heart Surgery;  Laterality: N/A;   APPENDECTOMY  1998   BIOPSY  08/11/2018   Procedure: BIOPSY;  Surgeon: NMauri Pole MD;  [place] [facility]  Service: Endoscopy;;   CATARACT EXTRACTION W/ INTRAOCULAR LENS  IMPLANT, BILATERAL  1998/  2000   CERVICAL FUSION  1985   C4 - 5   CORONARY ARTERY BYPASS GRAFT N/A 01/03/2018   Procedure: CORONARY ARTERY BYPASS GRAFTING (CABG) x 4 (LIMA to LAD, SVG to DIAGONAL, SVG to RAMUS INTERMEDIATE, and SVG to PDA), USING LEFT INFTERNAL MAMMARY ARTERY AND;  Surgeon: VPrescott Gum PCollier Salina MD;  [place] [facility]  Service: Open Heart Surgery;  Laterality: N/A;   ENDARTERECTOMY Right 01/03/2018  Procedure: ENDARTERECTOMY CAROTID;  Surgeon: Marty Heck, MD;  [facility];  Service: Vascular;  Laterality: Right;   ESOPHAGOGASTRODUODENOSCOPY (EGD) WITH PROPOFOL N/A 08/11/2018   Procedure: ESOPHAGOGASTRODUODENOSCOPY (EGD) WITH PROPOFOL;  Surgeon: Mauri Pole, MD;  Location: [facility];  Service: Endoscopy;  Laterality: N/A;    KNEE ARTHROSCOPY W/ MENISCECTOMY  1991  1987   LEFT KNEE   x 2   NASAL SINUS SURGERY  1982   ORIF ANKLE FRACTURE Right 10/07/2019   Procedure: OPEN REDUCTION INTERNAL FIXATION (ORIF) ANKLE FRACTURE;  Surgeon: Hiram Gash, MD;  Location: [facility];  Service: Orthopedics;  Laterality: Right;  Regional RIGHT  ankle block as well.   RIGHT/LEFT HEART CATH AND CORONARY ANGIOGRAPHY N/A 12/24/2017   Procedure: RIGHT/LEFT HEART CATH AND CORONARY ANGIOGRAPHY;  Surgeon: Jolaine Artist, MD;  Location: [facility];  Service: Cardiovascular;  Laterality: N/A;   ROTATOR CUFF REPAIR  09-04-2005   LEFT SHOULDER   TEE WITHOUT CARDIOVERSION N/A 12/24/2017   Procedure: TRANSESOPHAGEAL ECHOCARDIOGRAM (TEE);  Surgeon: Jolaine Artist, MD;  Location: [facility];  Service: Cardiovascular;  Laterality: N/A;   TEE WITHOUT CARDIOVERSION N/A 01/03/2018   Procedure: TRANSESOPHAGEAL ECHOCARDIOGRAM (TEE);  Surgeon: Prescott Gum, Collier Salina, MD;  Location: [facility];  Service: Open Heart Surgery;  Laterality: N/A;   TONSILLECTOMY  AGE 16   TOTAL HIP ARTHROPLASTY Right 12/23/2018   Procedure: TOTAL HIP ARTHROPLASTY ANTERIOR APPROACH;  Surgeon: Paralee Cancel, MD;  Location: [facility];  Service: Orthopedics;  Laterality: Right;  70 mins    Social History:  reports that he quit smoking about 3 years ago. His smoking use included cigars and cigarettes. He has a 5.75 pack-year smoking history. He has never used smokeless tobacco. He reports that he does not currently use alcohol. He reports that he does not use drugs.   Allergies  Allergen Reactions   Codeine Swelling    Tolerates hydrocodone, tramadol, and oxycodone    Dilaudid [Hydromorphone] Anxiety    Hallucinations    Doxycycline Itching and Rash    Family History  Adopted: Yes  Problem Relation Age of Onset   Bladder Cancer Father        adopted father      Prior to Admission medications   Medication Sig Start Date End Date Taking? Authorizing Provider   albuterol (VENTOLIN HFA) 108 (90 Base) MCG/ACT inhaler Inhale into the lungs. 11/11/19   [provider]  Carboxymethylcellulose Sodium (REFRESH TEARS OP) Place 1-2 drops into both eyes daily as needed (dry eyes).    [provider]  Cholecalciferol 125 MCG (5000 UT) capsule Take 5,000 Units by mouth daily.     [provider]  cyclobenzaprine (FLEXERIL) 10 MG tablet Take 10 mg by mouth 3 (three) times daily.     [provider]  docusate sodium (COLACE) 100 MG capsule Take 1 capsule (100 mg total) by mouth 2 (two) times daily. 10/12/19   Meuth, Brooke A, PA-C  ELIQUIS 5 MG TABS tablet Take 5 mg by mouth 2 (two) times daily. 11/29/19   [provider]  ferrous sulfate 325 (65 FE) MG tablet Take 1 tablet (325 mg total) by mouth 2 (two) times daily with a meal. 08/13/18   Kayleen Memos, DO  furosemide (LASIX) 20 MG tablet Take 20 mg by mouth daily as needed for fluid. 04/17/18   [provider]  gabapentin (NEURONTIN) 300 MG capsule Take 300 mg by mouth 3 (three) times daily.  [provider]  HYDROcodone-acetaminophen (NORCO) 7.5-325 MG tablet Take 1-2 tablets by mouth every 4 (four) hours as needed for moderate pain.  03/28/19   [provider]  hydroxyurea (HYDREA) 500 MG capsule TAKE 1 CAPSULE BY MOUTH 2 TIMES DAILY. MAY TAKE WITH FOOD TO MINIMIZE GI SIDE EFFECTS. 03/18/21   Volanda Napoleon, MD  Lidocaine (BLUE-EMU PAIN RELIEF DRY EX) Apply 1 application topically daily as needed (pain).    [provider]  loratadine (CLARITIN) 10 MG tablet Take 10 mg by mouth daily.    [provider]  metoprolol succinate (TOPROL-XL) 25 MG 24 hr tablet Take 25 mg by mouth 2 (two) times daily.    [provider]  Multiple Vitamins-Minerals (CENTRUM ADULTS) TABS Take 1 capsule by mouth daily.    [provider]  pantoprazole (PROTONIX) 40 MG tablet TAKE 1 TABLET BY MOUTH EVERY DAY Patient taking differently:  Take 40 mg by mouth daily. 08/14/19   Cirigliano, Vito V, DO  potassium chloride (MICRO-K) 10 MEQ CR capsule Take 10 mEq by mouth daily as needed (take when taking lasix).    [provider]  pregabalin (LYRICA) 50 MG capsule Take 50 mg by mouth 3 (three) times daily. Patient is only taking one tablet daily    [provider]  rosuvastatin (CRESTOR) 20 MG tablet TAKE 1 TABLET BY MOUTH EVERY DAY IN THE EVENING 01/29/20   Buford Dresser, MD  senna-docusate (SENOKOT-S) 8.6-50 MG tablet Take 1 tablet by mouth 2 (two) times daily. 10/12/19   Meuth, Brooke A, PA-C  tamsulosin (FLOMAX) 0.4 MG CAPS capsule Take 1 capsule (0.4 mg total) by mouth daily. 10/13/19   Meuth, Brooke A, PA-C  zolpidem (AMBIEN) 10 MG tablet Take 10 mg by mouth at bedtime.     [provider]    Physical Exam: BP (!) 141/62   Pulse 87   Temp (!) 97.5 F (36.4 C) (Oral)   Resp 18   SpO2 99%   General: 66 y.o. year-old male well developed well nourished in no acute distress.  Alert and oriented x3. Cardiovascular: Irregular rate and rhythm with no rubs or gallops.  No thyromegaly or JVD noted.  No lower extremity edema. 2/4 pulses in all 4 extremities. Respiratory: Clear to auscultation with no wheezes or rales. Good inspiratory effort. Abdomen: Soft nontender nondistended with normal bowel sounds x4 quadrants. Muskuloskeletal: No cyanosis, clubbing or edema noted bilaterally Neuro: CN II-XII intact, strength, sensation, reflexes Skin: No ulcerative lesions noted or rashes Psychiatry: Judgement and insight appear normal. Mood is appropriate for condition and setting          Labs on Admission:  Basic Metabolic Panel: Recent Labs  Lab 08/14/21 1329 08/18/21 1302 08/18/21 1602  NA 134 137 134*  K 4.3 4.6 4.5  CL 101 105 102  CO2 18* 22 20*  GLUCOSE 123* 129* 106*  BUN '15 14 13  '$ CREATININE 1.01 0.97 0.97  CALCIUM 8.2* 9.1 8.7*   Liver Function Tests: Recent Labs  Lab  08/14/21 1329 08/18/21 1302 08/18/21 1602  AST 37 22 26  ALT '14 10 14  '$ ALKPHOS 153* 103 101  BILITOT 1.1 1.1 1.0  PROT 6.4 6.5 6.7  ALBUMIN 3.6* 3.5 2.9*   No results for input(s): "LIPASE", "AMYLASE" in the last 168 hours. No results for input(s): "AMMONIA" in the last 168 hours. CBC: Recent Labs  Lab 08/14/21 1329 08/18/21 1302 08/18/21 1602  WBC 32.9* 30.3* 27.9*  NEUTROABS 27.8* 25.4*  25.9*  HGB 7.7* 7.0* 7.2*  HCT 24.2* 23.4* 23.8*  MCV 106* 113.0* 113.3*  PLT 180 164 173   Cardiac Enzymes: No results for input(s): "CKTOTAL", "CKMB", "CKMBINDEX", "TROPONINI" in the last 168 hours.  BNP (last 3 results) No results for input(s): "BNP" in the last 8760 hours.  ProBNP (last 3 results) No results for input(s): "PROBNP" in the last 8760 hours.  CBG: No results for input(s): "GLUCAP" in the last 168 hours.  Radiological Exams on Admission: No results found.  EKG: I independently viewed the EKG done and my findings are as followed: Atrial fibrillation with a rate of 84.  Nonspecific ST-T changes.  QTc 451  Assessment/Plan Present on Admission:  GI bleed  Principal Problem:   GI bleed  GI bleed, suspected upper History of nonbleeding gastric ulcers with clean ulcer base, Forrest class III, seen on EGD done in 2020. The patient is on 40 mg p.o. Protonix daily, was also on Eliquis for primary prevention of atrial fibrillation. Presented with hemoglobin of 7.0, repeat is 7.2.  Baseline hemoglobin 14.1 less than a month ago. 2 units PRBC ordered to be transfused, repeat CBC post transfusions. Started on Protonix drip in the ED, continue. [facility] [place] consulted by EDP. Serial H&H every 6 hours Transfuse hemoglobin less than 8.0. Maintain MAP greater than 65. Iron studies with iron deficiency anemia.  The patient is on iron supplement, hold for now due to anticipated EGD.  Symptomatic anemia Reports generalized fatigue, x3 weeks Presents with hemoglobin of 7.0 The  patient is on hydroxyurea We will obtain peripheral smear and reticulocyte count Hematology will see in consultation  Leukocytosis, appears chronic Follows with hematologist Dr. Marin Olp Obtain UA and chest x-ray to rule out superimposed active infective process Obtain urine culture and blood cultures, follow  Essential thrombocythemia Follows with hematologist oncologist Currently on hydroxyurea.  Permanent atrial fibrillation on Eliquis Coronary artery disease status post CABG Peripheral vascular disease Bilateral carotid stenosis status post CEA Status post aortic valve replacement Continue to hold off home Eliquis and home aspirin due to GI bleed and symptomatic anemia. Last dose Eliquis was on 08/18/2021 morning Restart home Eliquis when okay with GI and cardiology. CHA2DS2-VASc of 4 Restart home Crestor. Consult cardiology in the morning for input and recommendations.  Mild euvolemic hyponatremia Serum sodium 134 Gentle IV fluid hydration NS at 50 cc/h x 2 days.  Mild non-anion gap metabolic acidosis Serum bicarb 20 Anion gap 12 Gentle IV fluid hydration Repeat renal panel in the morning  Chronic diastolic CHF Last 2D echo done on 08/01/2018 revealed LVEF 60 to 65%. Euvolemic on exam Closely monitor volume status while on gentle IV fluid hydration Start strict I's and O's and daily weight Hold all oral antihypertensives to avoid hypotension Maintain MAP greater than 65 in the setting of GI bleed    Critical care time: 65 minutes.     DVT prophylaxis: SCDs  Code Status: Full code  Family Communication: None at bedside  Disposition Plan: Admitted to progressive unit  Consults called: Hematologist oncologist  Admission status: Inpatient status.   Status is: Inpatient The patient requires at least 2 midnights for further evaluation and treatment of present condition.   Kayleen Memos MD [facility] Pager [phone]  If 7PM-7AM, please  contact night-coverage [email or web address] Password [facility]  08/18/2021, 8:18 PM

## 2021-08-18 NOTE — ED Provider Triage Note (Signed)
Emergency Medicine Provider Triage Evaluation Note  Justin [name] , a 66 y.o. male  was evaluated in triage.  Patient with past medical history significant for leukemia presents from oncology office for concern of acute blood loss anemia.  Hemoglobin 7 today.  Endorses melanotic stools, fatigue, and lightheadedness.  Denies abdominal pain, bright red blood per rectum, or hematemesis.  Review of Systems  Positive: As above Negative: As above  Physical Exam  BP (!) 138/57 (BP Location: Right Arm)   Pulse 92   Temp 98.5 F (36.9 C) (Oral)   Resp 20   SpO2 93%  Gen:   Awake, no distress   Resp:  Normal effort  MSK:   Moves extremities without difficulty  Other:    Medical Decision Making  Medically screening exam initiated at 4:00 PM.  Appropriate orders placed.  Justin [name] was informed that the remainder of the evaluation will be completed by another provider, this initial triage assessment does not replace that evaluation, and the importance of remaining in the ED until their evaluation is complete.     Evlyn Courier, PA-C 08/18/21 1601

## 2021-08-18 NOTE — ED Triage Notes (Signed)
Pt c/o abnormal lab, advised by Ennever at [facility] that his occult was +, Hgn of 7. Pt on eliquis for hx bypass. Advises hypothermia, hypotension, fatigue. Denies orthostatic changes. Pt pale in triage

## 2021-08-19 ENCOUNTER — Encounter (HOSPITAL_COMMUNITY): Payer: Self-pay | Admitting: Internal Medicine

## 2021-08-19 DIAGNOSIS — D649 Anemia, unspecified: Secondary | ICD-10-CM | POA: Diagnosis not present

## 2021-08-19 DIAGNOSIS — K921 Melena: Secondary | ICD-10-CM | POA: Diagnosis not present

## 2021-08-19 DIAGNOSIS — Z7901 Long term (current) use of anticoagulants: Secondary | ICD-10-CM

## 2021-08-19 LAB — CBC WITH DIFFERENTIAL/PLATELET
Abs Immature Granulocytes: 0 10*3/uL (ref 0.00–0.07)
Basophils Absolute: 1.3 10*3/uL — ABNORMAL HIGH (ref 0.0–0.1)
Basophils Relative: 5 %
Eosinophils Absolute: 0.3 10*3/uL (ref 0.0–0.5)
Eosinophils Relative: 1 %
HCT: 26.5 % — ABNORMAL LOW (ref 39.0–52.0)
Hemoglobin: 8.6 g/dL — ABNORMAL LOW (ref 13.0–17.0)
Lymphocytes Relative: 4 %
Lymphs Abs: 1.1 10*3/uL (ref 0.7–4.0)
MCH: 34.1 pg — ABNORMAL HIGH (ref 26.0–34.0)
MCHC: 32.5 g/dL (ref 30.0–36.0)
MCV: 105.2 fL — ABNORMAL HIGH (ref 80.0–100.0)
Monocytes Absolute: 1.3 10*3/uL — ABNORMAL HIGH (ref 0.1–1.0)
Monocytes Relative: 5 %
Neutro Abs: 22.4 10*3/uL — ABNORMAL HIGH (ref 1.7–7.7)
Neutrophils Relative %: 85 %
Platelets: 141 10*3/uL — ABNORMAL LOW (ref 150–400)
RBC: 2.52 MIL/uL — ABNORMAL LOW (ref 4.22–5.81)
RDW: 24.2 % — ABNORMAL HIGH (ref 11.5–15.5)
WBC: 26.3 10*3/uL — ABNORMAL HIGH (ref 4.0–10.5)
nRBC: 1.3 % — ABNORMAL HIGH (ref 0.0–0.2)
nRBC: 2 /100 WBC — ABNORMAL HIGH

## 2021-08-19 LAB — COMPREHENSIVE METABOLIC PANEL
ALT: 14 U/L (ref 0–44)
AST: 27 U/L (ref 15–41)
Albumin: 2.8 g/dL — ABNORMAL LOW (ref 3.5–5.0)
Alkaline Phosphatase: 99 U/L (ref 38–126)
Anion gap: 7 (ref 5–15)
BUN: 12 mg/dL (ref 8–23)
CO2: 22 mmol/L (ref 22–32)
Calcium: 8.4 mg/dL — ABNORMAL LOW (ref 8.9–10.3)
Chloride: 104 mmol/L (ref 98–111)
Creatinine, Ser: 0.96 mg/dL (ref 0.61–1.24)
GFR, Estimated: >60 mL/min (ref >60)
Glucose, Bld: 101 mg/dL — ABNORMAL HIGH (ref 70–99)
Potassium: 3.8 mmol/L (ref 3.5–5.1)
Sodium: 133 mmol/L — ABNORMAL LOW (ref 135–145)
Total Bilirubin: 1.5 mg/dL — ABNORMAL HIGH (ref 0.3–1.2)
Total Protein: 6.5 g/dL (ref 6.5–8.1)

## 2021-08-19 LAB — TROPONIN I (HIGH SENSITIVITY): Troponin I (High Sensitivity): 21 ng/L — ABNORMAL HIGH (ref <18)

## 2021-08-19 LAB — URINALYSIS, ROUTINE W REFLEX MICROSCOPIC
Bilirubin Urine: NEGATIVE
Glucose, UA: NEGATIVE mg/dL
Hgb urine dipstick: NEGATIVE
Ketones, ur: NEGATIVE mg/dL
Leukocytes,Ua: NEGATIVE
Nitrite: NEGATIVE
Protein, ur: NEGATIVE mg/dL
Specific Gravity, Urine: 1.006 (ref 1.005–1.030)
pH: 5 (ref 5.0–8.0)

## 2021-08-19 LAB — VITAMIN B12: Vitamin B-12: 4876 pg/mL — ABNORMAL HIGH (ref 180–914)

## 2021-08-19 LAB — RETICULOCYTES
Immature Retic Fract: 37.9 % — ABNORMAL HIGH (ref 2.3–15.9)
RBC.: 2.62 MIL/uL — ABNORMAL LOW (ref 4.22–5.81)
Retic Count, Absolute: 158.5 10*3/uL (ref 19.0–186.0)
Retic Ct Pct: 6.1 % — ABNORMAL HIGH (ref 0.4–3.1)

## 2021-08-19 LAB — BASIC METABOLIC PANEL
Anion gap: 7 (ref 5–15)
BUN: 10 mg/dL (ref 8–23)
CO2: 23 mmol/L (ref 22–32)
Calcium: 8.5 mg/dL — ABNORMAL LOW (ref 8.9–10.3)
Chloride: 101 mmol/L (ref 98–111)
Creatinine, Ser: 1.09 mg/dL (ref 0.61–1.24)
GFR, Estimated: >60 mL/min (ref >60)
Glucose, Bld: 121 mg/dL — ABNORMAL HIGH (ref 70–99)
Potassium: 4.4 mmol/L (ref 3.5–5.1)
Sodium: 131 mmol/L — ABNORMAL LOW (ref 135–145)

## 2021-08-19 LAB — HEMOGLOBIN AND HEMATOCRIT, BLOOD
HCT: 29.6 % — ABNORMAL LOW (ref 39.0–52.0)
HCT: 28.5 % — ABNORMAL LOW (ref 39.0–52.0)
Hemoglobin: 9.7 g/dL — ABNORMAL LOW (ref 13.0–17.0)
Hemoglobin: 9.3 g/dL — ABNORMAL LOW (ref 13.0–17.0)

## 2021-08-19 LAB — TECHNOLOGIST SMEAR REVIEW

## 2021-08-19 LAB — HIV ANTIBODY (ROUTINE TESTING W REFLEX): HIV Screen 4th Generation wRfx: NONREACTIVE

## 2021-08-19 LAB — PHOSPHORUS: Phosphorus: 4.9 mg/dL — ABNORMAL HIGH (ref 2.5–4.6)

## 2021-08-19 LAB — MAGNESIUM: Magnesium: 1.8 mg/dL (ref 1.7–2.4)

## 2021-08-19 LAB — BRAIN NATRIURETIC PEPTIDE: B Natriuretic Peptide: 926.1 pg/mL — ABNORMAL HIGH (ref 0.0–100.0)

## 2021-08-19 LAB — FOLATE: Folate: 19 ng/mL (ref >5.9)

## 2021-08-19 MED ORDER — POLYETHYLENE GLYCOL 3350 17 G PO PACK: 17.0000 g | PACK | Freq: Every day | ORAL | Status: DC | PRN

## 2021-08-19 MED ORDER — PANTOPRAZOLE SODIUM 40 MG PO TBEC
40.0000 mg | DELAYED_RELEASE_TABLET | Freq: Every day | ORAL | Status: DC
  Administered 2021-08-19: 40 mg via ORAL
  Filled 2021-08-19: qty 1

## 2021-08-19 MED ORDER — ACETAMINOPHEN 325 MG PO TABS: 325.0000 mg | ORAL_TABLET | Freq: Four times a day (QID) | ORAL | Status: DC | PRN

## 2021-08-19 MED ORDER — GABAPENTIN 300 MG PO CAPS
300.0000 mg | ORAL_CAPSULE | Freq: Three times a day (TID) | ORAL | Status: DC
  Administered 2021-08-19 – 2021-08-22 (×8): 300 mg via ORAL
  Filled 2021-08-19 (×9): qty 1

## 2021-08-19 MED ORDER — ALBUTEROL SULFATE HFA 108 (90 BASE) MCG/ACT IN AERS: 1.0000 | INHALATION_SPRAY | Freq: Four times a day (QID) | RESPIRATORY_TRACT | Status: DC | PRN

## 2021-08-19 MED ORDER — ALBUTEROL SULFATE (2.5 MG/3ML) 0.083% IN NEBU: 2.5000 mg | INHALATION_SOLUTION | Freq: Four times a day (QID) | RESPIRATORY_TRACT | Status: DC | PRN

## 2021-08-19 MED ORDER — METOPROLOL SUCCINATE ER 25 MG PO TB24
25.0000 mg | ORAL_TABLET | Freq: Two times a day (BID) | ORAL | Status: DC
  Administered 2021-08-19 – 2021-08-22 (×5): 25 mg via ORAL
  Filled 2021-08-19 (×6): qty 1

## 2021-08-19 MED ORDER — OXYCODONE HCL 5 MG PO TABS
5.0000 mg | ORAL_TABLET | Freq: Four times a day (QID) | ORAL | Status: DC | PRN
  Administered 2021-08-19 – 2021-08-22 (×6): 5 mg via ORAL
  Filled 2021-08-19 (×7): qty 1

## 2021-08-19 NOTE — ED Notes (Signed)
Patient is laying in bed. No needs at this time. Call bell is within reach.

## 2021-08-19 NOTE — Anesthesia Preprocedure Evaluation (Signed)
Anesthesia Evaluation  Patient identified by MRN, date of birth, ID band Patient awake    Reviewed: Allergy & Precautions, NPO status , Patient's Chart, lab work & pertinent test results, reviewed documented beta blocker date and time   History of Anesthesia Complications Negative for: history of anesthetic complications  Airway Mallampati: I  TM Distance: >3 FB Neck ROM: Full    Dental  (+) Edentulous Lower, Edentulous Upper   Pulmonary sleep apnea , former smoker,    Pulmonary exam normal        Cardiovascular hypertension, Pt. on medications and Pt. on home beta blockers + CAD and + Peripheral Vascular Disease  Normal cardiovascular exam+ dysrhythmias (on Eliquis) Atrial Fibrillation   bilateral carotid stenosis status post CEA  TTE 2020: EF 60-65%, mild LVH, moderate LAE, s/p AVR (2019)     Neuro/Psych negative neurological ROS  negative psych ROS   GI/Hepatic Neg liver ROS, PUD, GERD  Medicated,GIB   Endo/Other  diabetes, Type 2  Renal/GU   negative genitourinary   Musculoskeletal  (+) Arthritis ,   Abdominal   Peds  Hematology  (+) Blood dyscrasia (Hgb 7.7, plt 107k), anemia ,   Anesthesia Other Findings Day of surgery medications reviewed with patient.  Reproductive/Obstetrics negative OB ROS                            Anesthesia Physical Anesthesia Plan  ASA: 4  Anesthesia Plan: MAC   Post-op Pain Management: Minimal or no pain anticipated   Induction:   PONV Risk Score and Plan: Treatment may vary due to age or medical condition and Propofol infusion  Airway Management Planned: Natural Airway and Nasal Cannula  Additional Equipment: None  Intra-op Plan:   Post-operative Plan:   Informed Consent: I have reviewed the patients History and Physical, chart, labs and discussed the procedure including the risks, benefits and alternatives for the proposed anesthesia with  the patient or authorized representative who has indicated his/her understanding and acceptance.       Plan Discussed with: CRNA  Anesthesia Plan Comments:        Anesthesia Quick Evaluation

## 2021-08-19 NOTE — Progress Notes (Signed)
  Transition of Care [facility]) Screening Note   Patient Details  Name: Justin [name] Date of Birth: [birth year]   Transition of Care [facility]) CM/SW Contact:    Cyndi Bender, RN Phone Number: 08/19/2021, 8:32 AM    Transition of Care Department [facility]) has reviewed patient and no TOC needs have been identified at this time. We will continue to monitor patient advancement through interdisciplinary progression rounds. If new patient transition needs arise, please place a TOC consult.

## 2021-08-19 NOTE — H&P (View-Only) (Signed)
                                                                            Gastroenterology Consult: 9:34 AM 08/19/2021  LOS: 1 day    Referring Provider: Dr C Hall Primary Care Physician:  Thacker, Robert, MD Primary Gastroenterologist:  Dr. Cirigliano Oncologist: Dr Ennever    Reason for Consultation: Black stools.  Anemia.   HPI: Justin [name] is a 66 y.o. male.  PMH polycythemia vera.  Thrombocytosis.  Macrocytosis.  Bone marrow biopsy 09/2018 pathology hypercellular marrow with Jak 2 positive myelo neoplasm involvement, mild myelofibrosis, treated with Hydrea.  Transfused 12/2017 (FFP, platelets PRBCs),  07/2018 (5 PRBCs),  09/2019(1 PRBC).  Followed by hematology.  DM 2.  Chronic Eliquis, 81 ASA for A-fib.  2019 CABG and bioprosthetic AVR.  Fatty liver.  Right CEA.  09/2019 ankle fracture from a fall complicated with retroperitoneal bleed, hemoperitoneum and hemothorax.  Chronic back pain.  Fatty liver, sliding hiatal hernia along with various changes from trauma by CT scan in September 2021  2018 colonoscopy by Dr. Ganem.  No polyps per patient. 07/2018 EGD, For melena.  Nonbleeding, clean based gastric ulcers.  Otherwise normal study.  Cleared to resume Eliquis in 3 days and prescribed Protonix/PPI for 3 months then drop to once daily. Pathology showed chronic gastritis, intestinal metaplasia, no dysplasia.  Oxyntic mucosa, no specific pathologic changes.  No H. Pylori.  Compliant w PPI  Recent labs and Hgb had dropped from 14.1 a month ago to 7.7.  At office visit of 7/27 when labs were obtained he complained of dyspnea, fatigue, lack of stamina, increased somnolence for some weeks.  Diminished appetite and p.o. intake but no nausea or vomiting..  Pale.  No symptoms suggesting GI bleed such as diarrhea, melena, hematemesis.  He was FOBT positive by rectal exam at Dr. Ennever's office  yesterday.  He was in A-fib.  About a month ago had a single episode of epistaxis, this has not recurred.  He was evaluated at [facility] urgent care.  Also about a month ago he had a encounter with his large dog and fell onto the carpet sustaining a large hematoma he describes as somewhat larger than the circumference of a softball.  This is no longer visible.  Does not use NSAIDs but takes baby aspirin. Na 133.  Normal BUN/creatinine.  T. bili 1.5, indirect 1.1.  Normal alk phos and transaminases.  Albumin 2.8. LDH elevated 525.  Mild elevation troponins 26, 21. Hgb 7.7..  7..  2 PRBCs ... 8.  In March Hgb was 12.7 but as low as 8.9 in fall 2021. Iron low 34.  Iron sat low 10%.  Ferritin 55.  Folate normal. INR 1.8.  Lives with his wife.  Moderate alcohol intake.  At most 1 to 2 glasses of wine in an evening sometimes not for several weeks.  At most drinks 4 or 5 times a month but often less than that.  Past Medical History:  Diagnosis Date   Acute meniscal tear of knee LEFT   Anemia    Aortic stenosis 12/01/2017   NONRHEUMATIC, AORTIC VALVE CALCIFICATIONS, MILD TO MODERATE REGURG, MILD TO MODERATE CALCIFIED ANNULUS per   ECHO 10/25/17 @ [facility] [address]   Arthritis    Atrial fibrillation (HCC) 11/12/2017   AT [facility]/V WITH PCP   Cancer (HCC)    skin right arm   Coronary artery disease    DM (diabetes mellitus) (HCC)    Heart murmur MILD-- ASYMPTOMATIC   History of kidney stones    Hyperlipidemia    Hypertension    Left knee pain    PAD (peripheral artery disease) (HCC)    left leg claudication    Past Surgical History:  Procedure Laterality Date   AORTIC VALVE REPLACEMENT N/A 01/03/2018   Procedure: AORTIC VALVE REPLACEMENT (AVR) using 23mm Magna Ease Bioprosthesis Aortic Valve;  Surgeon: Van Trigt, Peter, MD;  Location: [facility] OR;  Service: Open Heart Surgery;  Laterality: N/A;   APPENDECTOMY  1998   BIOPSY  08/11/2018   Procedure: BIOPSY;  Surgeon: Nandigam, Kavitha V, MD;  Location:  [facility];  Service: Endoscopy;;   CATARACT EXTRACTION W/ INTRAOCULAR LENS  IMPLANT, BILATERAL  1998/  2000   CERVICAL FUSION  1985   C4 - 5   CORONARY ARTERY BYPASS GRAFT N/A 01/03/2018   Procedure: CORONARY ARTERY BYPASS GRAFTING (CABG) x 4 (LIMA to LAD, SVG to DIAGONAL, SVG to RAMUS INTERMEDIATE, and SVG to PDA), USING LEFT INFTERNAL MAMMARY ARTERY AND;  Surgeon: Van Trigt, Peter, MD;  Location: [facility] OR;  Service: Open Heart Surgery;  Laterality: N/A;   ENDARTERECTOMY Right 01/03/2018   Procedure: ENDARTERECTOMY CAROTID;  Surgeon: Clark, Christopher J, MD;  Location: [facility] OR;  Service: Vascular;  Laterality: Right;   ESOPHAGOGASTRODUODENOSCOPY (EGD) WITH PROPOFOL N/A 08/11/2018   Procedure: ESOPHAGOGASTRODUODENOSCOPY (EGD) WITH PROPOFOL;  Surgeon: Nandigam, Kavitha V, MD;  Location: [facility];  Service: Endoscopy;  Laterality: N/A;   KNEE ARTHROSCOPY W/ MENISCECTOMY  1991  1987   LEFT KNEE   x 2   NASAL SINUS SURGERY  1982   ORIF ANKLE FRACTURE Right 10/07/2019   Procedure: OPEN REDUCTION INTERNAL FIXATION (ORIF) ANKLE FRACTURE;  Surgeon: Varkey, Dax T, MD;  Location: [facility];  Service: Orthopedics;  Laterality: Right;  Regional RIGHT  ankle block as well.   RIGHT/LEFT HEART CATH AND CORONARY ANGIOGRAPHY N/A 12/24/2017   Procedure: RIGHT/LEFT HEART CATH AND CORONARY ANGIOGRAPHY;  Surgeon: Bensimhon, Daniel R, MD;  Location: [facility];  Service: Cardiovascular;  Laterality: N/A;   ROTATOR CUFF REPAIR  09-04-2005   LEFT SHOULDER   TEE WITHOUT CARDIOVERSION N/A 12/24/2017   Procedure: TRANSESOPHAGEAL ECHOCARDIOGRAM (TEE);  Surgeon: Bensimhon, Daniel R, MD;  [facility]: [facility];  Service: Cardiovascular;  Laterality: N/A;   TEE WITHOUT CARDIOVERSION N/A 01/03/2018   Procedure: TRANSESOPHAGEAL ECHOCARDIOGRAM (TEE);  Surgeon: Van Trigt, Peter, MD;  Location: [facility];  Service: Open Heart Surgery;  Laterality: N/A;   TONSILLECTOMY  AGE 6   TOTAL HIP ARTHROPLASTY Right 12/23/2018    Procedure: TOTAL HIP ARTHROPLASTY ANTERIOR APPROACH;  Surgeon: Olin, Matthew, MD;  Location: [facility];  Service: Orthopedics;  Laterality: Right;  70 mins    Prior to Admission medications   Medication Sig Start Date End Date Taking? Authorizing Provider  albuterol (VENTOLIN HFA) 108 (90 Base) MCG/ACT inhaler Inhale 1 puff into the lungs every 6 (six) hours as needed for shortness of breath. 11/11/19  Yes [provider]  Carboxymethylcellulose Sodium (REFRESH TEARS OP) Place 1-2 drops into both eyes daily as needed (dry eyes).   Yes [provider]  Cholecalciferol 125 MCG (5000 UT) capsule Take 5,000 Units by mouth daily.      Yes [provider]  cyclobenzaprine (FLEXERIL) 10 MG tablet Take 10 mg by mouth 3 (three) times daily.    Yes [provider]  docusate sodium (COLACE) 100 MG capsule Take 1 capsule (100 mg total) by mouth 2 (two) times daily. Patient taking differently: Take 100 mg by mouth daily as needed for mild constipation. 10/12/19  Yes Meuth, Brooke A, PA-C  ELIQUIS 5 MG TABS tablet Take 5 mg by mouth 2 (two) times daily. 11/29/19  Yes [provider]  ferrous sulfate 325 (65 FE) MG tablet Take 1 tablet (325 mg total) by mouth 2 (two) times daily with a meal. 08/13/18  Yes Hall, Carole N, DO  furosemide (LASIX) 20 MG tablet Take 20 mg by mouth daily as needed for fluid. 04/17/18  Yes [provider]  gabapentin (NEURONTIN) 300 MG capsule Take 300 mg by mouth 3 (three) times daily.   Yes [provider]  HYDROcodone-acetaminophen (NORCO) 7.5-325 MG tablet Take 1-2 tablets by mouth every 4 (four) hours as needed for moderate pain.  03/28/19  Yes [provider]  hydroxyurea (HYDREA) 500 MG capsule TAKE 1 CAPSULE BY MOUTH 2 TIMES DAILY. MAY TAKE WITH FOOD TO MINIMIZE GI SIDE EFFECTS. Patient taking differently: Take 500 mg by mouth 2 (two) times daily. 03/18/21  Yes Ennever, Peter R, MD  Lidocaine (BLUE-EMU PAIN RELIEF DRY  EX) Apply 1 application topically daily as needed (pain).   Yes [provider]  loratadine (CLARITIN) 10 MG tablet Take 10 mg by mouth daily.   Yes [provider]  metoprolol succinate (TOPROL-XL) 25 MG 24 hr tablet Take 25 mg by mouth 2 (two) times daily.   Yes [provider]  Multiple Vitamins-Minerals (CENTRUM ADULTS) TABS Take 1 capsule by mouth daily.   Yes [provider]  pantoprazole (PROTONIX) 40 MG tablet TAKE 1 TABLET BY MOUTH EVERY DAY Patient taking differently: Take 40 mg by mouth daily. 08/14/19  Yes Cirigliano, Vito V, DO  potassium chloride (MICRO-K) 10 MEQ CR capsule Take 10 mEq by mouth daily as needed (take when taking lasix).   Yes [provider]  pregabalin (LYRICA) 50 MG capsule Take 50 mg by mouth 3 (three) times daily. Patient is only taking one tablet daily   Yes [provider]  rosuvastatin (CRESTOR) 20 MG tablet TAKE 1 TABLET BY MOUTH EVERY DAY IN THE EVENING Patient taking differently: Take 20 mg by mouth daily. 01/29/20  Yes Christopher, Bridgette, MD  tamsulosin (FLOMAX) 0.4 MG CAPS capsule Take 1 capsule (0.4 mg total) by mouth daily. 10/13/19  Yes Meuth, Brooke A, PA-C  zolpidem (AMBIEN) 10 MG tablet Take 10 mg by mouth at bedtime.    Yes [provider]  senna-docusate (SENOKOT-S) 8.6-50 MG tablet Take 1 tablet by mouth 2 (two) times daily. Patient not taking: Reported on 08/18/2021 10/12/19   Meuth, Brooke A, PA-C    Scheduled Meds:  hydroxyurea  500 mg Oral BID   rosuvastatin  20 mg Oral Daily   tamsulosin  0.4 mg Oral Daily   zolpidem  10 mg Oral QHS   Infusions:  PRN Meds: acetaminophen, oxyCODONE, polyethylene glycol   Allergies as of 08/18/2021 - Review Complete 08/18/2021  Allergen Reaction Noted   Codeine Swelling 10/15/2011   Dilaudid [hydromorphone] Anxiety 03/07/2020   Doxycycline Itching and Rash 01/26/2020    Family History  Adopted: Yes  Problem Relation Age of Onset    Bladder Cancer Father          adopted father    Social History   Socioeconomic History   Marital status: Married    Spouse name: Not on file   Number of children: Not on file   Years of education: Not on file   Highest education level: Not on file  Occupational History   Not on file  Tobacco Use   Smoking status: Former    Packs/day: 0.25    Years: 23.00    Total pack years: 5.75    Types: Cigars, Cigarettes    Quit date: 01/02/2018    Years since quitting: 3.6   Smokeless tobacco: Never   Tobacco comments:    quit cigs in 2011 and smokes cigars daily- from 3-10 cigars-09/27/13  Vaping Use   Vaping Use: Never used  Substance and Sexual Activity   Alcohol use: Not Currently    Alcohol/week: 2.0 standard drinks of alcohol    Types: 2 Glasses of wine per week    Comment: occasional   Drug use: No    Comment: occasionally   Sexual activity: Not Currently  Other Topics Concern   Not on file  Social History Narrative   Not on file   Social Determinants of Health   Financial Resource Strain: Not on file  Food Insecurity: Not on file  Transportation Needs: Not on file  Physical Activity: Not on file  Stress: Not on file  Social Connections: Not on file  Intimate Partner Violence: Not on file    REVIEW OF SYSTEMS: Constitutional: Weakness, fatigue ENT: Chronically occluded left nasal passage, occasionally passes green sputum from this.  Epistaxis about a month ago Pulm: Dyspnea CV:  No palpitations, no LE edema.  No angina GU:  No hematuria, no frequency GI: See HPI Heme: See HPI Transfusions: See HPI Neuro:  No headaches, no peripheral tingling or numbness.  No dizziness.  No syncope.  No seizures Derm:  No itching, no rash or sores.  Endocrine:  No sweats or chills.  No polyuria or dysuria Immunization: Reviewed Travel: Not queried.   PHYSICAL EXAM: Vital signs in last 24 hours: Vitals:   08/19/21 0734 08/19/21 0848  BP: 128/63   Pulse: 91   Resp: (!) 22    Temp: 98.2 F (36.8 C) 98.3 F (36.8 C)  SpO2: 100%    Wt Readings from Last 3 Encounters:  08/19/21 77.6 kg  08/18/21 77.6 kg  08/14/21 77.7 kg    General: Very pale, looks somewhat chronically ill but comfortable, pleasant and in no distress. Head: No facial asymmetry or swelling.  No signs of head trauma. Eyes: Pale conjunctiva.  EOMI Ears: No obvious hearing deficit Nose: No discharge Mouth: Dentures in place.  Mucosa is pink, moist, clear.  Tongue is midline Neck: No JVD.  No masses, no thyromegaly Lungs: Some crackles in the bases.  No dyspnea at rest or with speech.  No cough Heart: A-fib rate to 100 on monitor.  Soft systolic ejection murmur. Abdomen: Nonobese, nondistended.  Active bowel sounds.  No HSM, masses, bruits, hernias..   Rectal: Did not repeat rectal exam.  He was FOBT positive by Dr. Ennever's exam yesterday. Musc/Skeltl: No joint redness, swelling or gross deformity. Extremities: No CCE. Neurologic: Fully alert and oriented.  Moves all 4 limbs without gross weakness or deficits but strength not formally tested. Skin: Pale do not see telangiectasia.  No visible or palpable bruise on the left hip where he had fallen about a month ago. Tattoos: Several tattoos on his upper body and   arms. Nodes: No cervical adenopathy Psych: Calm, pleasant, cooperative, fluid speech.  Intake/Output from previous day: 07/31 0701 - 08/01 0700 In: 630 [Blood:630] Out: 650 [Urine:650] Intake/Output this shift: No intake/output data recorded.  LAB RESULTS: Recent Labs    08/18/21 1302 08/18/21 1602 08/19/21 0553  WBC 30.3* 27.9* 26.3*  HGB 7.0* 7.2* 8.6*  HCT 23.4* 23.8* 26.5*  PLT 164 173 141*   BMET Lab Results  Component Value Date   NA 133 (L) 08/19/2021   NA 134 (L) 08/18/2021   NA 137 08/18/2021   K 3.8 08/19/2021   K 4.5 08/18/2021   K 4.6 08/18/2021   CL 104 08/19/2021   CL 102 08/18/2021   CL 105 08/18/2021   CO2 22 08/19/2021   CO2 20 (L)  08/18/2021   CO2 22 08/18/2021   GLUCOSE 101 (H) 08/19/2021   GLUCOSE 106 (H) 08/18/2021   GLUCOSE 129 (H) 08/18/2021   BUN 12 08/19/2021   BUN 13 08/18/2021   BUN 14 08/18/2021   CREATININE 0.96 08/19/2021   CREATININE 0.97 08/18/2021   CREATININE 0.97 08/18/2021   CALCIUM 8.4 (L) 08/19/2021   CALCIUM 8.7 (L) 08/18/2021   CALCIUM 9.1 08/18/2021   LFT Recent Labs    08/18/21 1302 08/18/21 1602 08/19/21 0553  PROT 6.5 6.7 6.5  ALBUMIN 3.5 2.9* 2.8*  AST 22 26 27  ALT 10 14 14  ALKPHOS 103 101 99  BILITOT 1.1 1.0 1.5*   PT/INR Lab Results  Component Value Date   INR 1.8 (H) 08/18/2021   INR 1.4 (H) 10/07/2019   INR 1.3 (H) 10/06/2019   Hepatitis Panel No results for input(s): "HEPBSAG", "HCVAB", "HEPAIGM", "HEPBIGM" in the last 72 hours. C-Diff No components found for: "CDIFF" Lipase  No results found for: "LIPASE"  Drugs of Abuse  No results found for: "LABOPIA", "COCAINSCRNUR", "LABBENZ", "AMPHETMU", "THCU", "LABBARB"   RADIOLOGY STUDIES: DG CHEST PORT 1 VIEW  Result Date: 08/18/2021 CLINICAL DATA:  Anemia EXAM: PORTABLE CHEST 1 VIEW COMPARISON:  10/10/2019, chest CT 10/04/2019, 10/19/2017 FINDINGS: Post sternotomy changes with atrial appendage clip. Cardiomegalywith vascular congestion and moderate diffuse interstitial and ground-glass opacities suspected to represent pulmonary edema. Probable trace left effusion. Aortic atherosclerosis. No pneumothorax IMPRESSION: Cardiomegaly with vascular congestion and diffuse interstitial and ground-glass opacity consistent with pulmonary edema. Probable small left effusion Electronically Signed   By: Kim  Fujinaga M.D.   On: 08/18/2021 21:10      IMPRESSION:   Macrocytic anemia acute drop in Hgb over 4 months.  Symptomatic with fatigue, dyspnea, increased somnolence.  On twice daily oral iron.  No B12 or folate levels.  Chronic Eliquis.  Last dose AM 7/31.  Essential thrombocythemia.  Managed with Hydrea, followed by  Dr. Ennever.    Nonbleeding gastric ulcers at EGD in 07/2018.  On Protonix 40 daily at home.  Colonoscopy without colon polyps in 2020, this is patient report, do not have copy of procedure report  PLAN:       EGD tomorrow.  Await B12. Folate, serial Hgb/hct  HH diet, NPO after midnight.  Resumed Protonix 40 mg daily   Keirah Konitzer  08/19/2021, 9:34 AM Phone [phone]      

## 2021-08-19 NOTE — Consult Note (Addendum)
[facility] Consult: 9:34 AM 08/19/2021  LOS: 1 day    Referring Provider: Dr Aileen Fass Primary Care Physician:  [name], MD Primary Gastroenterologist:  Dr. Bryan Lemma Oncologist: Dr Marin Olp    Reason for Consultation: Black stools.  Anemia.   HPI: Justin [name] is a 66 y.o. male.  PMH polycythemia vera.  Thrombocytosis.  Macrocytosis.  Bone marrow biopsy 09/2018 pathology hypercellular marrow with Jak 2 positive myelo neoplasm involvement, mild myelofibrosis, treated with Hydrea.  Transfused 12/2017 (FFP, platelets PRBCs),  07/2018 (5 PRBCs),  09/2019(1 PRBC).  Followed by hematology.  DM 2.  Chronic Eliquis, 81 ASA for A-fib.  2019 CABG and bioprosthetic AVR.  Fatty liver.  Right CEA.  09/2019 ankle fracture from a fall complicated with retroperitoneal bleed, hemoperitoneum and hemothorax.  Chronic back pain.  Fatty liver, sliding hiatal hernia along with various changes from trauma by CT scan in September 2021  2018 colonoscopy by Dr. Penelope Coop.  No polyps per patient. 07/2018 EGD, For melena.  Nonbleeding, clean based gastric ulcers.  Otherwise normal study.  Cleared to resume Eliquis in 3 days and prescribed Protonix/PPI for 3 months then drop to once daily. Pathology showed chronic gastritis, intestinal metaplasia, no dysplasia.  Oxyntic mucosa, no specific pathologic changes.  No H. Pylori.  Compliant w PPI  Recent labs and Hgb had dropped from 14.1 a month ago to 7.7.  At office visit of 7/27 when labs were obtained he complained of dyspnea, fatigue, lack of stamina, increased somnolence for some weeks.  Diminished appetite and p.o. intake but no nausea or vomiting..  Pale.  No symptoms suggesting GI bleed such as diarrhea, melena, hematemesis.  He was FOBT positive by rectal exam at Dr. Antonieta Pert office  yesterday.  He was in A-fib.  About a month ago had a single episode of epistaxis, this has not recurred.  He was evaluated at [facility] urgent care.  Also about a month ago he had a encounter with his large dog and fell onto the carpet sustaining a large hematoma he describes as somewhat larger than the circumference of a softball.  This is no longer visible.  Does not use NSAIDs but takes baby aspirin. Na 133.  Normal BUN/creatinine.  T. bili 1.5, indirect 1.1.  Normal alk phos and transaminases.  Albumin 2.8. LDH elevated 525.  Mild elevation troponins 26, 21. Hgb 7.7..  7..  2 PRBCs ... 8.  In March Hgb was 12.7 but as low as 8.9 in fall 2021. Iron low 34.  Iron sat low 10%.  Ferritin 55.  Folate normal. INR 1.8.  Lives with his wife.  Moderate alcohol intake.  At most 1 to 2 glasses of wine in an evening sometimes not for several weeks.  At most drinks 4 or 5 times a month but often less than that.  Past Medical History:  Diagnosis Date   Acute meniscal tear of knee LEFT   Anemia    Aortic stenosis 12/01/2017   NONRHEUMATIC, AORTIC VALVE CALCIFICATIONS, MILD TO MODERATE REGURG, MILD TO MODERATE CALCIFIED ANNULUS per  ECHO 10/25/17 @ [facility] [address]   Arthritis    Atrial fibrillation ([place]) 11/12/2017   AT O/V WITH PCP   Cancer ([place])    skin right arm   Coronary artery disease    DM (diabetes mellitus) ([place])    Heart murmur MILD-- ASYMPTOMATIC   History of kidney stones    Hyperlipidemia    Hypertension    Left knee pain    PAD (peripheral artery disease) ([facility])    left leg claudication    Past Surgical History:  Procedure Laterality Date   AORTIC VALVE REPLACEMENT N/A 01/03/2018   Procedure: AORTIC VALVE REPLACEMENT (AVR) using 4m Magna Ease Bioprosthesis Aortic Valve;  Surgeon: VIvin Poot MD;  [place]  Service: Open Heart Surgery;  Laterality: N/A;   APPENDECTOMY  1998   BIOPSY  08/11/2018   Procedure: BIOPSY;  Surgeon: NMauri Pole MD;  [place]  Service: Endoscopy;;   CATARACT EXTRACTION W/ INTRAOCULAR LENS  IMPLANT, BILATERAL  1998/  2000   CERVICAL FUSION  1985   C4 - 5   CORONARY ARTERY BYPASS GRAFT N/A 01/03/2018   Procedure: CORONARY ARTERY BYPASS GRAFTING (CABG) x 4 (LIMA to LAD, SVG to DIAGONAL, SVG to RAMUS INTERMEDIATE, and SVG to PDA), USING LEFT INFTERNAL MAMMARY ARTERY AND;  Surgeon: VPrescott Gum PCollier Salina MD;  [place]  Service: Open Heart Surgery;  Laterality: N/A;   ENDARTERECTOMY Right 01/03/2018   Procedure: ENDARTERECTOMY CAROTID;  Surgeon: CMarty Heck MD;  [facility];  Service: Vascular;  Laterality: Right;   ESOPHAGOGASTRODUODENOSCOPY (EGD) WITH PROPOFOL N/A 08/11/2018   Procedure: ESOPHAGOGASTRODUODENOSCOPY (EGD) WITH PROPOFOL;  Surgeon: NMauri Pole MD;  [place]  Service: Endoscopy;  Laterality: N/A;   KNEE ARTHROSCOPY W/ MENISCECTOMY  1991  1987   LEFT KNEE   x 2   NASAL SINUS SURGERY  1982   ORIF ANKLE FRACTURE Right 10/07/2019   Procedure: OPEN REDUCTION INTERNAL FIXATION (ORIF) ANKLE FRACTURE;  Surgeon: VHiram Gash MD;  [place]  Service: Orthopedics;  Laterality: Right;  Regional RIGHT  ankle block as well.   RIGHT/LEFT HEART CATH AND CORONARY ANGIOGRAPHY N/A 12/24/2017   Procedure: RIGHT/LEFT HEART CATH AND CORONARY ANGIOGRAPHY;  Surgeon: BJolaine Artist MD;  [facility] [facility];  Service: Cardiovascular;  Laterality: N/A;   ROTATOR CUFF REPAIR  09-04-2005   LEFT SHOULDER   TEE WITHOUT CARDIOVERSION N/A 12/24/2017   Procedure: TRANSESOPHAGEAL ECHOCARDIOGRAM (TEE);  Surgeon: BJolaine Artist MD;  [facility];  Service: Cardiovascular;  Laterality: N/A;   TEE WITHOUT CARDIOVERSION N/A 01/03/2018   Procedure: TRANSESOPHAGEAL ECHOCARDIOGRAM (TEE);  Surgeon: VPrescott Gum PCollier Salina MD;  [place]  Service: Open Heart Surgery;  Laterality: N/A;   TONSILLECTOMY  AGE 24   TOTAL HIP ARTHROPLASTY Right 12/23/2018    Procedure: TOTAL HIP ARTHROPLASTY ANTERIOR APPROACH;  Surgeon: OParalee Cancel MD;  [facility];  Service: Orthopedics;  Laterality: Right;  70 mins    Prior to Admission medications   Medication Sig Start Date End Date Taking? Authorizing Provider  albuterol (VENTOLIN HFA) 108 (90 Base) MCG/ACT inhaler Inhale 1 puff into the lungs every 6 (six) hours as needed for shortness of breath. 11/11/19  Yes [provider]  Carboxymethylcellulose Sodium (REFRESH TEARS OP) Place 1-2 drops into both eyes daily as needed (dry eyes).   Yes [provider]  Cholecalciferol 125 MCG (5000 UT) capsule Take 5,000 Units by mouth daily.  Yes [provider]  cyclobenzaprine (FLEXERIL) 10 MG tablet Take 10 mg by mouth 3 (three) times daily.    Yes [provider]  docusate sodium (COLACE) 100 MG capsule Take 1 capsule (100 mg total) by mouth 2 (two) times daily. Patient taking differently: Take 100 mg by mouth daily as needed for mild constipation. 10/12/19  Yes Meuth, Brooke A, PA-C  ELIQUIS 5 MG TABS tablet Take 5 mg by mouth 2 (two) times daily. 11/29/19  Yes [provider]  ferrous sulfate 325 (65 FE) MG tablet Take 1 tablet (325 mg total) by mouth 2 (two) times daily with a meal. 08/13/18  Yes Hall, Carole N, DO  furosemide (LASIX) 20 MG tablet Take 20 mg by mouth daily as needed for fluid. 04/17/18  Yes [provider]  gabapentin (NEURONTIN) 300 MG capsule Take 300 mg by mouth 3 (three) times daily.   Yes [provider]  HYDROcodone-acetaminophen (NORCO) 7.5-325 MG tablet Take 1-2 tablets by mouth every 4 (four) hours as needed for moderate pain.  03/28/19  Yes [provider]  hydroxyurea (HYDREA) 500 MG capsule TAKE 1 CAPSULE BY MOUTH 2 TIMES DAILY. MAY TAKE WITH FOOD TO MINIMIZE GI SIDE EFFECTS. Patient taking differently: Take 500 mg by mouth 2 (two) times daily. 03/18/21  Yes Ennever, Rudell Cobb, MD  Lidocaine (BLUE-EMU PAIN RELIEF DRY  EX) Apply 1 application topically daily as needed (pain).   Yes [provider]  loratadine (CLARITIN) 10 MG tablet Take 10 mg by mouth daily.   Yes [provider]  metoprolol succinate (TOPROL-XL) 25 MG 24 hr tablet Take 25 mg by mouth 2 (two) times daily.   Yes [provider]  Multiple Vitamins-Minerals (CENTRUM ADULTS) TABS Take 1 capsule by mouth daily.   Yes [provider]  pantoprazole (PROTONIX) 40 MG tablet TAKE 1 TABLET BY MOUTH EVERY DAY Patient taking differently: Take 40 mg by mouth daily. 08/14/19  Yes Cirigliano, Vito V, DO  potassium chloride (MICRO-K) 10 MEQ CR capsule Take 10 mEq by mouth daily as needed (take when taking lasix).   Yes [provider]  pregabalin (LYRICA) 50 MG capsule Take 50 mg by mouth 3 (three) times daily. Patient is only taking one tablet daily   Yes [provider]  rosuvastatin (CRESTOR) 20 MG tablet TAKE 1 TABLET BY MOUTH EVERY DAY IN THE EVENING Patient taking differently: Take 20 mg by mouth daily. 01/29/20  Yes Buford Dresser, MD  tamsulosin (FLOMAX) 0.4 MG CAPS capsule Take 1 capsule (0.4 mg total) by mouth daily. 10/13/19  Yes Meuth, Brooke A, PA-C  zolpidem (AMBIEN) 10 MG tablet Take 10 mg by mouth at bedtime.    Yes [provider]  senna-docusate (SENOKOT-S) 8.6-50 MG tablet Take 1 tablet by mouth 2 (two) times daily. Patient not taking: Reported on 08/18/2021 10/12/19   Wellington Hampshire, PA-C    Scheduled Meds:  hydroxyurea  500 mg Oral BID   rosuvastatin  20 mg Oral Daily   tamsulosin  0.4 mg Oral Daily   zolpidem  10 mg Oral QHS   Infusions:  PRN Meds: acetaminophen, oxyCODONE, polyethylene glycol   Allergies as of 08/18/2021 - Review Complete 08/18/2021  Allergen Reaction Noted   Codeine Swelling 10/15/2011   Dilaudid [hydromorphone] Anxiety 03/07/2020   Doxycycline Itching and Rash 01/26/2020    Family History  Adopted: Yes  Problem Relation Age of Onset    Bladder Cancer Father  adopted father    Social History   Socioeconomic History   Marital status: Married    Spouse name: Not on file   Number of children: Not on file   Years of education: Not on file   Highest education level: Not on file  Occupational History   Not on file  Tobacco Use   Smoking status: Former    Packs/day: 0.25    Years: 23.00    Total pack years: 5.75    Types: Cigars, Cigarettes    Quit date: 01/02/2018    Years since quitting: 3.6   Smokeless tobacco: Never   Tobacco comments:    quit cigs in 2011 and smokes cigars daily- from 3-10 cigars-09/27/13  Vaping Use   Vaping Use: Never used  Substance and Sexual Activity   Alcohol use: Not Currently    Alcohol/week: 2.0 standard drinks of alcohol    Types: 2 Glasses of wine per week    Comment: occasional   Drug use: No    Comment: occasionally   Sexual activity: Not Currently  Other Topics Concern   Not on file  Social History Narrative   Not on file   Social Determinants of Health   Financial Resource Strain: Not on file  Food Insecurity: Not on file  Transportation Needs: Not on file  Physical Activity: Not on file  Stress: Not on file  Social Connections: Not on file  Intimate Partner Violence: Not on file    REVIEW OF SYSTEMS: Constitutional: Weakness, fatigue ENT: Chronically occluded left nasal passage, occasionally passes green sputum from this.  Epistaxis about a month ago Pulm: Dyspnea CV:  No palpitations, no LE edema.  No angina GU:  No hematuria, no frequency GI: See HPI Heme: See HPI Transfusions: See HPI Neuro:  No headaches, no peripheral tingling or numbness.  No dizziness.  No syncope.  No seizures Derm:  No itching, no rash or sores.  Endocrine:  No sweats or chills.  No polyuria or dysuria Immunization: Reviewed Travel: Not queried.   PHYSICAL EXAM: Vital signs in last 24 hours: Vitals:   08/19/21 0734 08/19/21 0848  BP: 128/63   Pulse: 91   Resp: (!) 22    Temp: 98.2 F (36.8 C) 98.3 F (36.8 C)  SpO2: 100%    Wt Readings from Last 3 Encounters:  08/19/21 77.6 kg  08/18/21 77.6 kg  08/14/21 77.7 kg    General: Very pale, looks somewhat chronically ill but comfortable, pleasant and in no distress. Head: No facial asymmetry or swelling.  No signs of head trauma. Eyes: Pale conjunctiva.  EOMI Ears: No obvious hearing deficit Nose: No discharge Mouth: Dentures in place.  Mucosa is pink, moist, clear.  Tongue is midline Neck: No JVD.  No masses, no thyromegaly Lungs: Some crackles in the bases.  No dyspnea at rest or with speech.  No cough Heart: A-fib rate to 100 on monitor.  Soft systolic ejection murmur. Abdomen: Nonobese, nondistended.  Active bowel sounds.  No HSM, masses, bruits, hernias..   Rectal: Did not repeat rectal exam.  He was FOBT positive by Dr. Antonieta Pert exam yesterday. Musc/Skeltl: No joint redness, swelling or gross deformity. Extremities: No CCE. Neurologic: Fully alert and oriented.  Moves all 4 limbs without gross weakness or deficits but strength not formally tested. Skin: Pale do not see telangiectasia.  No visible or palpable bruise on the left hip where he had fallen about a month ago. Tattoos: Several tattoos on his upper body and  arms. Nodes: No cervical adenopathy Psych: Calm, pleasant, cooperative, fluid speech.  Intake/Output from previous day: 07/31 0701 - 08/01 0700 In: 630 [Blood:630] Out: 650 [Urine:650] Intake/Output this shift: No intake/output data recorded.  LAB RESULTS: Recent Labs    08/18/21 1302 08/18/21 1602 08/19/21 0553  WBC 30.3* 27.9* 26.3*  HGB 7.0* 7.2* 8.6*  HCT 23.4* 23.8* 26.5*  PLT 164 173 141*   BMET Lab Results  Component Value Date   NA 133 (L) 08/19/2021   NA 134 (L) 08/18/2021   NA 137 08/18/2021   K 3.8 08/19/2021   K 4.5 08/18/2021   K 4.6 08/18/2021   CL 104 08/19/2021   CL 102 08/18/2021   CL 105 08/18/2021   CO2 22 08/19/2021   CO2 20 (L)  08/18/2021   CO2 22 08/18/2021   GLUCOSE 101 (H) 08/19/2021   GLUCOSE 106 (H) 08/18/2021   GLUCOSE 129 (H) 08/18/2021   BUN 12 08/19/2021   BUN 13 08/18/2021   BUN 14 08/18/2021   CREATININE 0.96 08/19/2021   CREATININE 0.97 08/18/2021   CREATININE 0.97 08/18/2021   CALCIUM 8.4 (L) 08/19/2021   CALCIUM 8.7 (L) 08/18/2021   CALCIUM 9.1 08/18/2021   LFT Recent Labs    08/18/21 1302 08/18/21 1602 08/19/21 0553  PROT 6.5 6.7 6.5  ALBUMIN 3.5 2.9* 2.8*  AST _0 ALT _1 ALKPHOS 103 101 99  BILITOT 1.1 1.0 1.5*   PT/INR Lab Results  Component Value Date   INR 1.8 (H) 08/18/2021   INR 1.4 (H) 10/07/2019   INR 1.3 (H) 10/06/2019   Hepatitis Panel No results for input(s): "HEPBSAG", "HCVAB", "HEPAIGM", "HEPBIGM" in the last 72 hours. C-Diff No components found for: "CDIFF" Lipase  No results found for: "LIPASE"  Drugs of Abuse  No results found for: "LABOPIA", "COCAINSCRNUR", "LABBENZ", "AMPHETMU", "THCU", "LABBARB"   RADIOLOGY STUDIES: DG CHEST PORT 1 VIEW  Result Date: 08/18/2021 CLINICAL DATA:  Anemia EXAM: PORTABLE CHEST 1 VIEW COMPARISON:  10/10/2019, chest CT 10/04/2019, 10/19/2017 FINDINGS: Post sternotomy changes with atrial appendage clip. Cardiomegalywith vascular congestion and moderate diffuse interstitial and ground-glass opacities suspected to represent pulmonary edema. Probable trace left effusion. Aortic atherosclerosis. No pneumothorax IMPRESSION: Cardiomegaly with vascular congestion and diffuse interstitial and ground-glass opacity consistent with pulmonary edema. Probable small left effusion Electronically Signed   By: Donavan Foil M.D.   On: 08/18/2021 21:10      IMPRESSION:   Macrocytic anemia acute drop in Hgb over 4 months.  Symptomatic with fatigue, dyspnea, increased somnolence.  On twice daily oral iron.  No B12 or folate levels.  Chronic Eliquis.  Last dose AM 7/31.  Essential thrombocythemia.  Managed with Hydrea, followed by  Dr. Marin Olp.    Nonbleeding gastric ulcers at EGD in 07/2018.  On Protonix 40 daily at home.  Colonoscopy without colon polyps in 2020, this is patient report, do not have copy of procedure report  PLAN:       EGD tomorrow.  Await B12. Folate, serial Hgb/hct  HH diet, NPO after midnight.  Resumed Protonix 40 mg daily   Azucena Freed  08/19/2021, 9:34 AM Phone [phone]

## 2021-08-19 NOTE — Progress Notes (Signed)
PROGRESS NOTE    Justin [name]  WNU:[id number] DOB: 1955-04-14 DOA: 08/18/2021 PCP: [name], MD   No chief complaint on file.   Brief Narrative:   Justin [name] is a 66 y.o. male with medical history significant for essential thrombocythemia on hydroxyurea, chronic leukocytosis, permanent atrial fibrillation on Eliquis, history of nonbleeding gastric ulcers seen on EGD in 2020, bilateral carotid stenosis status post CEA, coronary artery disease status post CABG, status post aortic valve replacement, peripheral artery disease, GERD, BPH, Essential hypertension, hyperlipidemia, polyneuropathy, treated squamous cell cancer of the skin follows with dermatology, who presented to [facility] ED at the recommendation of his hematologist oncologist Dr. Marin Olp due to symptomatic anemia as evidenced by generalized fatigue, dyspnea, hemoglobin of 7.0 K, black stools and positive FOBT.   -Patient is admitted for further work-up.  Assessment & Plan:   Principal Problem:   GI bleed  GI bleed Chronic blood loss anemia -History of nonbleeding gastric ulcers with clean ulcer base, Forrest class III, seen on EGD done in 2020. -Patient Hemoccult positive . -Baseline hemoglobin 14 than a month ago, presents with hemoglobin of 7, received 2 units PRBC . -As needed, monitor H&H closely, so far good response with hemoglobin of 9.7 -GI input greatly appreciated, plan for endoscopy in a.m. -Given IV Protonix initially, continue with p.o. Protonix currently    Leukocytosis, appears chronic Follows with hematologist Dr. Melinda Crutch to be chronic issue.   Essential thrombocythemia - Currently on hydroxyurea.   Permanent atrial fibrillation on Eliquis Coronary artery disease status post CABG Peripheral vascular disease Bilateral carotid stenosis status post CEA Status post aortic valve replacement - Continue to hold off home Eliquis and home aspirin due to GI bleed and symptomatic  anemia. - Last dose Eliquis was on 08/18/2021 morning - Restart home Eliquis when okay with GI and cardiology. - CHA2DS2-VASc of 4 - Restart home Crestor.    Mild euvolemic hyponatremia Serum sodium 134 Gentle IV fluid hydration NS at 50 cc/h x 2 days.   Mild non-anion gap metabolic acidosis Serum bicarb 20 Anion gap 12 Gentle IV fluid hydration Repeat renal panel in the morning   Chronic diastolic CHF Last 2D echo done on 08/01/2018 revealed LVEF 60 to 65%. Euvolemic on exam         DVT prophylaxis: SCD Code Status: Full Family Communication: None at bedside Disposition:   Status is: Inpatient    Consultants:  GI   Subjective:  reports dyspnea, generalized weakness and fatigue  Objective: Vitals:   08/19/21 0500 08/19/21 0734 08/19/21 0848 08/19/21 1213  BP: 118/65 128/63  (!) 150/75  Pulse: 90 91  99  Resp: (!) 25 (!) 22  20  Temp:  98.2 F (36.8 C) 98.3 F (36.8 C) 97.6 F (36.4 C)  TempSrc:  Oral  Oral  SpO2: 99% 100%    Weight:   77.6 kg   Height:   '5\' 8"'$  (1.727 m)     Intake/Output Summary (Last 24 hours) at 08/19/2021 1430 Last data filed at 08/19/2021 0615 Gross per 24 hour  Intake 630 ml  Output 650 ml  Net -20 ml   Filed Weights   08/19/21 0848  Weight: 77.6 kg    Examination:  General exam: Appears calm and comfortable  Respiratory system: Clear to auscultation. Respiratory effort normal. Cardiovascular system: S1 & S2 heard, RRR. No JVD, murmurs, rubs, gallops or clicks. No pedal edema. Gastrointestinal system: Abdomen is nondistended, soft and nontender. No organomegaly or masses  felt. Normal bowel sounds heard. Central nervous system: Alert and oriented. No focal neurological deficits. Extremities: Symmetric 5 x 5 power. Skin: No rashes, lesions or ulcers Psychiatry: Judgement and insight appear normal. Mood & affect appropriate.     Data Reviewed: I have personally reviewed following labs and imaging studies  CBC: Recent  Labs  Lab 08/14/21 1329 08/18/21 1302 08/18/21 1602 08/19/21 0553  WBC 32.9* 30.3* 27.9* 26.3*  NEUTROABS 27.8* 25.4* 25.9* 22.4*  HGB 7.7* 7.0* 7.2* 8.6*  HCT 24.2* 23.4* 23.8* 26.5*  MCV 106* 113.0* 113.3* 105.2*  PLT 180 164 173 141*    Basic Metabolic Panel: Recent Labs  Lab 08/14/21 1329 08/18/21 1302 08/18/21 1602 08/19/21 0553  NA 134 137 134* 133*  K 4.3 4.6 4.5 3.8  CL 101 105 102 104  CO2 18* 22 20* 22  GLUCOSE 123* 129* 106* 101*  BUN '15 14 13 12  '$ CREATININE 1.01 0.97 0.97 0.96  CALCIUM 8.2* 9.1 8.7* 8.4*  MG  --   --   --  1.8  PHOS  --   --   --  4.9*    GFR: Estimated Creatinine Clearance: 72.2 mL/min (by C-G formula based on SCr of 0.96 mg/dL).  Liver Function Tests: Recent Labs  Lab 08/14/21 1329 08/18/21 1302 08/18/21 1602 08/19/21 0553  AST 37 '22 26 27  '$ ALT '14 10 14 14  '$ ALKPHOS 153* 103 101 99  BILITOT 1.1 1.1 1.0 1.5*  PROT 6.4 6.5 6.7 6.5  ALBUMIN 3.6* 3.5 2.9* 2.8*    CBG: No results for input(s): "GLUCAP" in the last 168 hours.   No results found for this or any previous visit (from the past 240 hour(s)).       Radiology Studies: DG CHEST PORT 1 VIEW  Result Date: 08/18/2021 CLINICAL DATA:  Anemia EXAM: PORTABLE CHEST 1 VIEW COMPARISON:  10/10/2019, chest CT 10/04/2019, 10/19/2017 FINDINGS: Post sternotomy changes with atrial appendage clip. Cardiomegalywith vascular congestion and moderate diffuse interstitial and ground-glass opacities suspected to represent pulmonary edema. Probable trace left effusion. Aortic atherosclerosis. No pneumothorax IMPRESSION: Cardiomegaly with vascular congestion and diffuse interstitial and ground-glass opacity consistent with pulmonary edema. Probable small left effusion Electronically Signed   By: Donavan Foil M.D.   On: 08/18/2021 21:10        Scheduled Meds:  hydroxyurea  500 mg Oral BID   pantoprazole  40 mg Oral Q0600   rosuvastatin  20 mg Oral Daily   tamsulosin  0.4 mg Oral Daily    zolpidem  10 mg Oral QHS   Continuous Infusions:   LOS: 1 day        Phillips Climes, MD [facility]   To contact the attending provider between 7A-7P or the covering provider during after hours 7P-7A, please log into the web site [email or web address] and access using universal [place] password for that web site. If you do not have the password, please call the hospital operator.  08/19/2021, 2:30 PM

## 2021-08-19 NOTE — ED Notes (Signed)
ED TO INPATIENT HANDOFF REPORT  ED Nurse Name and Phone #: [phone]  S Name/Age/Gender Justin [name] 66 y.o. male Room/Bed: 001C/001C  Code Status   Code Status: Full Code  Home/SNF/Other Home Patient oriented to: self, place, time, and situation Is this baseline? Yes   Triage Complete: Triage complete  Chief Complaint GI bleed [K92.2]  Triage Note Pt c/o abnormal lab, advised by Ennever at [facility] that his occult was +, Hgn of 7. Pt on eliquis for hx bypass. Advises hypothermia, hypotension, fatigue. Denies orthostatic changes. Pt pale in triage   Allergies Allergies  Allergen Reactions   Codeine Swelling    Tolerates hydrocodone, tramadol, and oxycodone    Dilaudid [Hydromorphone] Anxiety    Hallucinations    Doxycycline Itching and Rash    Level of Care/Admitting Diagnosis ED Disposition     ED Disposition  Admit   Condition  --   [place]: [place] [[id number]]  Level of Care: Progressive [102]  Admit to Progressive based on following criteria: GI, ENDOCRINE disease patients with GI bleeding, acute liver failure or pancreatitis, stable with diabetic ketoacidosis or thyrotoxicosis (hypothyroid) state.  May admit patient to [facility] or [facility] if equivalent level of care is available:: Yes  Covid Evaluation: Asymptomatic - no recent exposure (last 10 days) testing not required  Diagnosis: GI bleed [235361]  Admitting Physician: Kayleen Memos [[id number]]  Attending Physician: Kayleen Memos [[id number]]  Certification:: I certify this patient will need inpatient services for at least 2 midnights  Estimated Length of Stay: 2          B Medical/Surgery History Past Medical History:  Diagnosis Date   Acute meniscal tear of knee LEFT   Anemia    Aortic stenosis 12/01/2017   NONRHEUMATIC, AORTIC VALVE CALCIFICATIONS, MILD TO MODERATE REGURG, MILD TO MODERATE CALCIFIED ANNULUS per ECHO 10/25/17 @ [facility] [address]    Arthritis    Atrial fibrillation ([place]) 11/12/2017   AT O/V WITH PCP   Cancer ([place])    skin right arm   Coronary artery disease    DM (diabetes mellitus) ([place])    Heart murmur MILD-- ASYMPTOMATIC   History of kidney stones    Hyperlipidemia    Hypertension    Left knee pain    PAD (peripheral artery disease) ([place])    left leg claudication   Past Surgical History:  Procedure Laterality Date   AORTIC VALVE REPLACEMENT N/A 01/03/2018   Procedure: AORTIC VALVE REPLACEMENT (AVR) using 41m Magna Ease Bioprosthesis Aortic Valve;  Surgeon: VIvin Poot MD;  [facility]  Service: Open Heart Surgery;  Laterality: N/A;   APPENDECTOMY  1998   BIOPSY  08/11/2018   Procedure: BIOPSY;  Surgeon: NMauri Pole MD;  [facility]  Service: Endoscopy;;   CATARACT EXTRACTION W/ INTRAOCULAR LENS  IMPLANT, BILATERAL  1998/  2000   CERVICAL FUSION  1985   C4 - 5   CORONARY ARTERY BYPASS GRAFT N/A 01/03/2018   Procedure: CORONARY ARTERY BYPASS GRAFTING (CABG) x 4 (LIMA to LAD, SVG to DIAGONAL, SVG to RAMUS INTERMEDIATE, and SVG to PDA), USING LEFT INFTERNAL MAMMARY ARTERY AND;  Surgeon: VPrescott Gum PCollier Salina MD;  [facility]  Service: Open Heart Surgery;  Laterality: N/A;   ENDARTERECTOMY Right 01/03/2018   Procedure: ENDARTERECTOMY CAROTID;  Surgeon: CMarty Heck MD;  [facility]  Service: Vascular;  Laterality: Right;   ESOPHAGOGASTRODUODENOSCOPY (EGD) WITH PROPOFOL N/A 08/11/2018  Procedure: ESOPHAGOGASTRODUODENOSCOPY (EGD) WITH PROPOFOL;  Surgeon: Mauri Pole, MD;  Location: [place] [facility];  Service: Endoscopy;  Laterality: N/A;   KNEE ARTHROSCOPY W/ MENISCECTOMY  1991  1987   LEFT KNEE   x 2   NASAL SINUS SURGERY  1982   ORIF ANKLE FRACTURE Right 10/07/2019   Procedure: OPEN REDUCTION INTERNAL FIXATION (ORIF) ANKLE FRACTURE;  Surgeon: Hiram Gash, MD;  Location: [place];  Service: Orthopedics;  Laterality: Right;  Regional RIGHT  ankle block as well.    RIGHT/LEFT HEART CATH AND CORONARY ANGIOGRAPHY N/A 12/24/2017   Procedure: RIGHT/LEFT HEART CATH AND CORONARY ANGIOGRAPHY;  Surgeon: Jolaine Artist, MD;  Location: [place] [facility];  Service: Cardiovascular;  Laterality: N/A;   ROTATOR CUFF REPAIR  09-04-2005   LEFT SHOULDER   TEE WITHOUT CARDIOVERSION N/A 12/24/2017   Procedure: TRANSESOPHAGEAL ECHOCARDIOGRAM (TEE);  Surgeon: Jolaine Artist, MD;  Location: [facility];  Service: Cardiovascular;  Laterality: N/A;   TEE WITHOUT CARDIOVERSION N/A 01/03/2018   Procedure: TRANSESOPHAGEAL ECHOCARDIOGRAM (TEE);  Surgeon: Prescott Gum, Collier Salina, MD;  Location: [place];  Service: Open Heart Surgery;  Laterality: N/A;   TONSILLECTOMY  AGE 35   TOTAL HIP ARTHROPLASTY Right 12/23/2018   Procedure: TOTAL HIP ARTHROPLASTY ANTERIOR APPROACH;  Surgeon: Paralee Cancel, MD;  [facility];  Service: Orthopedics;  Laterality: Right;  70 mins     A IV Location/Drains/Wounds Patient Lines/Drains/Airways Status     Active Line/Drains/Airways     Name Placement date Placement time Site Days   Peripheral IV 08/18/21 20 G Posterior;Right Hand 08/18/21  2000  Hand  1   Peripheral IV 08/19/21 20 G Posterior;Right Forearm 08/19/21  0556  Forearm  less than 1            Intake/Output Last 24 hours  Intake/Output Summary (Last 24 hours) at 08/19/2021 0735 Last data filed at 08/19/2021 0615 Gross per 24 hour  Intake 630 ml  Output 650 ml  Net -20 ml    Labs/Imaging Results for orders placed or performed during the hospital encounter of 08/18/21 (from the past 48 hour(s))  CBC with Differential     Status: Abnormal   Collection Time: 08/18/21  4:02 PM  Result Value Ref Range   WBC 27.9 (H) 4.0 - 10.5 K/uL   RBC 2.10 (L) 4.22 - 5.81 MIL/uL   Hemoglobin 7.2 (L) 13.0 - 17.0 g/dL   HCT 23.8 (L) 39.0 - 52.0 %   MCV 113.3 (H) 80.0 - 100.0 fL   MCH 34.3 (H) 26.0 - 34.0 pg   MCHC 30.3 30.0 - 36.0 g/dL   RDW 24.2 (H) 11.5 - 15.5 %   Platelets 173 150 -  400 K/uL   nRBC 1.4 (H) 0.0 - 0.2 %   Neutrophils Relative % 93 %   Neutro Abs 25.9 (H) 1.7 - 7.7 K/uL   Lymphocytes Relative 1 %   Lymphs Abs 0.3 (L) 0.7 - 4.0 K/uL   Monocytes Relative 4 %   Monocytes Absolute 1.1 (H) 0.1 - 1.0 K/uL   Eosinophils Relative 0 %   Eosinophils Absolute 0.0 0.0 - 0.5 K/uL   Basophils Relative 2 %   Basophils Absolute 0.6 (H) 0.0 - 0.1 K/uL   WBC Morphology HYPERSEGMENTED NEUT    RBC Morphology See Note     Comment: TEARDROP CELLS   Smear Review PLATELET COUNT CONFIRMED BY SMEAR    Abs Immature Granulocytes 0.00 0.00 - 0.07 K/uL   Ovalocytes PRESENT  Comment: Performed at [facility], [place] [address] [place], [place] [address]  Comprehensive metabolic panel     Status: Abnormal   Collection Time: 08/18/21  4:02 PM  Result Value Ref Range   Sodium 134 (L) 135 - 145 mmol/L   Potassium 4.5 3.5 - 5.1 mmol/L   Chloride 102 98 - 111 mmol/L   CO2 20 (L) 22 - 32 mmol/L   Glucose, Bld 106 (H) 70 - 99 mg/dL    Comment: Glucose reference range applies only to samples taken after fasting for at least 8 hours.   BUN 13 8 - 23 mg/dL   Creatinine, Ser 0.97 0.61 - 1.24 mg/dL   Calcium 8.7 (L) 8.9 - 10.3 mg/dL   Total Protein 6.7 6.5 - 8.1 g/dL   Albumin 2.9 (L) 3.5 - 5.0 g/dL   AST 26 15 - 41 U/L   ALT 14 0 - 44 U/L   Alkaline Phosphatase 101 38 - 126 U/L   Total Bilirubin 1.0 0.3 - 1.2 mg/dL   GFR, Estimated >60 >60 mL/min    Comment: (NOTE) Calculated using the CKD-EPI Creatinine Equation (2021)    Anion gap 12 5 - 15    Comment: Performed at [facility] [address], [place], [place] [address]  Type and screen [place]     Status: None (Preliminary result)   Collection Time: 08/18/21  4:02 PM  Result Value Ref Range   ABO/RH(D) A POS    Antibody Screen NEG    Sample Expiration 08/21/2021,2359    Unit Number [id number]    Blood Component Type RED CELLS,LR    Unit division 00    Status of Unit ISSUED     Transfusion Status OK TO TRANSFUSE    Crossmatch Result Compatible    Unit Number [id number]    Blood Component Type RBC LR PHER2    Unit division 00    Status of Unit ISSUED    Transfusion Status OK TO TRANSFUSE    Crossmatch Result      Compatible Performed at [facility], [place] [address], [place], [place] [address]   Protime-INR     Status: Abnormal   Collection Time: 08/18/21  4:02 PM  Result Value Ref Range   Prothrombin Time 20.3 (H) 11.4 - 15.2 seconds   INR 1.8 (H) 0.8 - 1.2    Comment: (NOTE) INR goal varies based on device and disease states. Performed at [facility], [place] [address], [place], [place] [address]   Troponin I (High Sensitivity)     Status: Abnormal   Collection Time: 08/18/21  4:02 PM  Result Value Ref Range   Troponin I (High Sensitivity) 26 (H) <18 ng/L    Comment: (NOTE) Elevated high sensitivity troponin I (hsTnI) values and significant  changes across serial measurements may suggest ACS but many other  chronic and acute conditions are known to elevate hsTnI results.  Refer to the "Links" section for chest pain algorithms and additional  guidance. Performed at [facility], [place] [address], [place], [place] [address]   Prepare RBC (crossmatch)     Status: None   Collection Time: 08/18/21  6:57 PM  Result Value Ref Range   Order Confirmation      ORDER PROCESSED BY BLOOD BANK Performed at [facility], [place] [address], [place], [place] [address]   Urinalysis, Routine w reflex microscopic Urine, Clean Catch  Status: None   Collection Time: 08/19/21  1:21 AM  Result Value Ref Range   Color, Urine YELLOW YELLOW   APPearance CLEAR CLEAR   Specific Gravity, Urine 1.006 1.005 - 1.030   pH 5.0 5.0 - 8.0   Glucose, UA NEGATIVE NEGATIVE mg/dL   Hgb urine dipstick NEGATIVE NEGATIVE   Bilirubin Urine NEGATIVE NEGATIVE   Ketones, ur NEGATIVE NEGATIVE mg/dL   Protein, ur NEGATIVE NEGATIVE mg/dL   Nitrite NEGATIVE NEGATIVE    Leukocytes,Ua NEGATIVE NEGATIVE    Comment: Performed at [facility] [address], [place], [place] [address]  CBC with Differential/Platelet     Status: Abnormal (Preliminary result)   Collection Time: 08/19/21  5:53 AM  Result Value Ref Range   WBC 26.3 (H) 4.0 - 10.5 K/uL    Comment: WHITE COUNT CONFIRMED ON SMEAR   RBC 2.52 (L) 4.22 - 5.81 MIL/uL   Hemoglobin 8.6 (L) 13.0 - 17.0 g/dL   HCT 26.5 (L) 39.0 - 52.0 %   MCV 105.2 (H) 80.0 - 100.0 fL    Comment: DELTA CHECK NOTED   MCH 34.1 (H) 26.0 - 34.0 pg   MCHC 32.5 30.0 - 36.0 g/dL   RDW 24.2 (H) 11.5 - 15.5 %   Platelets 141 (L) 150 - 400 K/uL   nRBC 1.3 (H) 0.0 - 0.2 %    Comment: Performed at [facility], [place] [address], [place], [place] [address]   Neutrophils Relative % PENDING %   Neutro Abs PENDING 1.7 - 7.7 K/uL   Band Neutrophils PENDING %   Lymphocytes Relative PENDING %   Lymphs Abs PENDING 0.7 - 4.0 K/uL   Monocytes Relative PENDING %   Monocytes Absolute PENDING 0.1 - 1.0 K/uL   Eosinophils Relative PENDING %   Eosinophils Absolute PENDING 0.0 - 0.5 K/uL   Basophils Relative PENDING %   Basophils Absolute PENDING 0.0 - 0.1 K/uL   WBC Morphology PENDING    RBC Morphology PENDING    Smear Review PENDING    Other PENDING %   nRBC PENDING 0 /100 WBC   Metamyelocytes Relative PENDING %   Myelocytes PENDING %   Promyelocytes Relative PENDING %   Blasts PENDING %   Immature Granulocytes PENDING %   Abs Immature Granulocytes PENDING 0.00 - 0.07 K/uL  Comprehensive metabolic panel     Status: Abnormal   Collection Time: 08/19/21  5:53 AM  Result Value Ref Range   Sodium 133 (L) 135 - 145 mmol/L   Potassium 3.8 3.5 - 5.1 mmol/L   Chloride 104 98 - 111 mmol/L   CO2 22 22 - 32 mmol/L   Glucose, Bld 101 (H) 70 - 99 mg/dL    Comment: Glucose reference range applies only to samples taken after fasting for at least 8 hours.   BUN 12 8 - 23 mg/dL   Creatinine, Ser 0.96 0.61 - 1.24 mg/dL   Calcium  8.4 (L) 8.9 - 10.3 mg/dL   Total Protein 6.5 6.5 - 8.1 g/dL   Albumin 2.8 (L) 3.5 - 5.0 g/dL   AST 27 15 - 41 U/L   ALT 14 0 - 44 U/L   Alkaline Phosphatase 99 38 - 126 U/L   Total Bilirubin 1.5 (H) 0.3 - 1.2 mg/dL   GFR, Estimated >60 >60 mL/min    Comment: (NOTE) Calculated using the CKD-EPI Creatinine Equation (2021)    Anion gap 7 5 - 15    Comment: Performed at  [facility], [place] [address], [place], [place] [address]  Phosphorus     Status: Abnormal   Collection Time: 08/19/21  5:53 AM  Result Value Ref Range   Phosphorus 4.9 (H) 2.5 - 4.6 mg/dL    Comment: Performed at [facility] [address], [place], [place] [address]  Magnesium     Status: None   Collection Time: 08/19/21  5:53 AM  Result Value Ref Range   Magnesium 1.8 1.7 - 2.4 mg/dL    Comment: Performed at [facility] [address], [place], [place] [address]  Troponin I (High Sensitivity)     Status: Abnormal   Collection Time: 08/19/21  5:53 AM  Result Value Ref Range   Troponin I (High Sensitivity) 21 (H) <18 ng/L    Comment: (NOTE) Elevated high sensitivity troponin I (hsTnI) values and significant  changes across serial measurements may suggest ACS but many other  chronic and acute conditions are known to elevate hsTnI results.  Refer to the "Links" section for chest pain algorithms and additional  guidance. Performed at [facility], [place] [address] [place], [place] [address]   Reticulocytes     Status: Abnormal   Collection Time: 08/19/21  5:53 AM  Result Value Ref Range   Retic Ct Pct 6.1 (H) 0.4 - 3.1 %   RBC. 2.62 (L) 4.22 - 5.81 MIL/uL   Retic Count, Absolute 158.5 19.0 - 186.0 K/uL   Immature Retic Fract 37.9 (H) 2.3 - 15.9 %    Comment: Performed at [facility] [address], [place], [place] [address]   DG CHEST PORT 1 VIEW  Result Date: 08/18/2021 CLINICAL DATA:  Anemia EXAM: PORTABLE CHEST 1 VIEW COMPARISON:  10/10/2019, chest CT 10/04/2019,  10/19/2017 FINDINGS: Post sternotomy changes with atrial appendage clip. Cardiomegalywith vascular congestion and moderate diffuse interstitial and ground-glass opacities suspected to represent pulmonary edema. Probable trace left effusion. Aortic atherosclerosis. No pneumothorax IMPRESSION: Cardiomegaly with vascular congestion and diffuse interstitial and ground-glass opacity consistent with pulmonary edema. Probable small left effusion Electronically Signed   By: Donavan Foil M.D.   On: 08/18/2021 21:10    Pending Labs Unresulted Labs (From admission, onward)     Start     Ordered   08/19/21 0700  HIV Antibody (routine testing w rflx)  Once,   R        08/19/21 0700   08/18/21 2142  Brain natriuretic peptide  Add-on,   AD        08/18/21 2141   08/18/21 2130  Technologist smear review  Once,   AD        08/18/21 2130   08/18/21 2056  Urine Culture  (Urine Culture)  Once,   R       Question Answer Comment  Indication Sepsis   Patient immune status Immunocompromised      08/18/21 2055   08/18/21 2056  Culture, blood (Routine X 2) w Reflex to ID Panel  BLOOD CULTURE X 2,   R     Question:  Patient immune status  Answer:  Immunocompromised   08/18/21 2055   08/18/21 2018  Hemoglobin and hematocrit, blood  Now then every 6 hours,   R      08/18/21 2017            Vitals/Pain Today's Vitals   08/19/21 0343 08/19/21 0500 08/19/21 0733 08/19/21 0734  BP: (!) 109/58 118/65  128/63  Pulse:  89 90  91  Resp: (!) 23 (!) 25  (!) 22  Temp: 97.9 F (36.6 C)   98.2 F (36.8 C)  TempSrc: Oral   Oral  SpO2: 98% 99%  100%  PainSc:   0-No pain     Isolation Precautions No active isolations  Medications Medications  hydroxyurea (HYDREA) capsule 500 mg (500 mg Oral Given 08/18/21 2228)  tamsulosin (FLOMAX) capsule 0.4 mg (0.4 mg Oral Given 08/18/21 2229)  zolpidem (AMBIEN) tablet 10 mg (10 mg Oral Given 08/18/21 2228)  rosuvastatin (CRESTOR) tablet 20 mg (has no administration in time  range)  acetaminophen (TYLENOL) tablet 325 mg (has no administration in time range)  oxyCODONE (Oxy IR/ROXICODONE) immediate release tablet 5 mg (5 mg Oral Given 08/19/21 0609)  polyethylene glycol (MIRALAX / GLYCOLAX) packet 17 g (has no administration in time range)  pantoprazole (PROTONIX) 80 mg /NS 100 mL IVPB (0 mg Intravenous Stopped 08/18/21 2101)  0.9 %  sodium chloride infusion (Manually program via Guardrails IV Fluids) (0 mLs Intravenous Stopped 08/19/21 0343)  furosemide (LASIX) injection 20 mg (20 mg Intravenous Given 08/18/21 2228)    Mobility Walks Normally walks without assistance at home. Pt is weak this admission and has only been standing to use the bathroom. Moderate fall risk   Focused Assessments   R Recommendations: See Admitting Provider Note  Report given to:   Additional Notes:  New oxygen requirement of 3L this admission.

## 2021-08-20 ENCOUNTER — Inpatient Hospital Stay (HOSPITAL_COMMUNITY): Payer: Medicare Other | Admitting: Anesthesiology

## 2021-08-20 ENCOUNTER — Encounter (HOSPITAL_COMMUNITY)
Admission: EM | Disposition: A | Discharge status: Home / Self Care | Payer: Self-pay | Source: Home / Self Care | Attending: Internal Medicine

## 2021-08-20 ENCOUNTER — Encounter (HOSPITAL_COMMUNITY): Payer: Self-pay | Admitting: Internal Medicine

## 2021-08-20 DIAGNOSIS — K922 Gastrointestinal hemorrhage, unspecified: Secondary | ICD-10-CM

## 2021-08-20 DIAGNOSIS — K449 Diaphragmatic hernia without obstruction or gangrene: Secondary | ICD-10-CM

## 2021-08-20 DIAGNOSIS — I482 Chronic atrial fibrillation, unspecified: Secondary | ICD-10-CM | POA: Diagnosis not present

## 2021-08-20 DIAGNOSIS — K2289 Other specified disease of esophagus: Secondary | ICD-10-CM

## 2021-08-20 DIAGNOSIS — N400 Enlarged prostate without lower urinary tract symptoms: Secondary | ICD-10-CM | POA: Diagnosis not present

## 2021-08-20 DIAGNOSIS — K3189 Other diseases of stomach and duodenum: Secondary | ICD-10-CM

## 2021-08-20 DIAGNOSIS — D62 Acute posthemorrhagic anemia: Secondary | ICD-10-CM | POA: Diagnosis not present

## 2021-08-20 DIAGNOSIS — K921 Melena: Secondary | ICD-10-CM | POA: Diagnosis not present

## 2021-08-20 HISTORY — PX: BIOPSY: SHX5522

## 2021-08-20 HISTORY — PX: ESOPHAGOGASTRODUODENOSCOPY (EGD) WITH PROPOFOL: SHX5813

## 2021-08-20 HISTORY — PX: HOT HEMOSTASIS: SHX5433

## 2021-08-20 LAB — TYPE AND SCREEN
ABO/RH(D): A POS
Antibody Screen: NEGATIVE
Unit division: 0
Unit division: 0

## 2021-08-20 LAB — CBC
HCT: 24 % — ABNORMAL LOW (ref 39.0–52.0)
HCT: 27.8 % — ABNORMAL LOW (ref 39.0–52.0)
Hemoglobin: 7.7 g/dL — ABNORMAL LOW (ref 13.0–17.0)
Hemoglobin: 8.9 g/dL — ABNORMAL LOW (ref 13.0–17.0)
MCH: 33.6 pg (ref 26.0–34.0)
MCH: 33.3 pg (ref 26.0–34.0)
MCHC: 32.1 g/dL (ref 30.0–36.0)
MCHC: 32 g/dL (ref 30.0–36.0)
MCV: 104.8 fL — ABNORMAL HIGH (ref 80.0–100.0)
MCV: 104.1 fL — ABNORMAL HIGH (ref 80.0–100.0)
Platelets: 107 10*3/uL — ABNORMAL LOW (ref 150–400)
Platelets: 118 10*3/uL — ABNORMAL LOW (ref 150–400)
RBC: 2.29 MIL/uL — ABNORMAL LOW (ref 4.22–5.81)
RBC: 2.67 MIL/uL — ABNORMAL LOW (ref 4.22–5.81)
RDW: 23.9 % — ABNORMAL HIGH (ref 11.5–15.5)
RDW: 24.1 % — ABNORMAL HIGH (ref 11.5–15.5)
WBC: 23.2 10*3/uL — ABNORMAL HIGH (ref 4.0–10.5)
WBC: 26.9 10*3/uL — ABNORMAL HIGH (ref 4.0–10.5)
nRBC: 0.9 % — ABNORMAL HIGH (ref 0.0–0.2)
nRBC: 0.5 % — ABNORMAL HIGH (ref 0.0–0.2)

## 2021-08-20 LAB — BPAM RBC
Blood Product Expiration Date: 202308242359
Blood Product Expiration Date: 202308242359
ISSUE DATE / TIME: 202307312048
ISSUE DATE / TIME: 202308010059
Unit Type and Rh: 6200
Unit Type and Rh: 6200

## 2021-08-20 LAB — URINE CULTURE

## 2021-08-20 SURGERY — ESOPHAGOGASTRODUODENOSCOPY (EGD) WITH PROPOFOL
Anesthesia: Monitor Anesthesia Care

## 2021-08-20 MED ORDER — PANTOPRAZOLE SODIUM 40 MG PO TBEC
40.0000 mg | DELAYED_RELEASE_TABLET | Freq: Every day | ORAL | Status: DC
  Filled 2021-08-20: qty 1

## 2021-08-20 MED ORDER — PHENYLEPHRINE 80 MCG/ML (10ML) SYRINGE FOR IV PUSH (FOR BLOOD PRESSURE SUPPORT)
PREFILLED_SYRINGE | INTRAVENOUS | Status: DC | PRN
  Administered 2021-08-20 (×2): 160 ug via INTRAVENOUS
  Administered 2021-08-20: 80 ug via INTRAVENOUS

## 2021-08-20 MED ORDER — PEG-KCL-NACL-NASULF-NA ASC-C 100 G PO SOLR
0.5000 | Freq: Once | ORAL | Status: AC
  Administered 2021-08-21: 100 g via ORAL
  Filled 2021-08-20 (×2): qty 1

## 2021-08-20 MED ORDER — LACTATED RINGERS IV SOLN
INTRAVENOUS | Status: AC | PRN
  Administered 2021-08-20: 1000 mL via INTRAVENOUS

## 2021-08-20 MED ORDER — LIDOCAINE 2% (20 MG/ML) 5 ML SYRINGE
INTRAMUSCULAR | Status: DC | PRN
  Administered 2021-08-20: 60 mg via INTRAVENOUS

## 2021-08-20 MED ORDER — SUCRALFATE 1 GM/10ML PO SUSP
1.0000 g | Freq: Two times a day (BID) | ORAL | Status: DC
  Administered 2021-08-20 – 2021-08-22 (×5): 1 g via ORAL
  Filled 2021-08-20 (×5): qty 10

## 2021-08-20 MED ORDER — PANTOPRAZOLE SODIUM 40 MG PO TBEC
40.0000 mg | DELAYED_RELEASE_TABLET | Freq: Two times a day (BID) | ORAL | Status: DC
  Administered 2021-08-20 – 2021-08-22 (×4): 40 mg via ORAL
  Filled 2021-08-20 (×4): qty 1

## 2021-08-20 MED ORDER — BISACODYL 5 MG PO TBEC: 10.0000 mg | DELAYED_RELEASE_TABLET | Freq: Once | ORAL | Status: DC

## 2021-08-20 MED ORDER — PROPOFOL 500 MG/50ML IV EMUL
INTRAVENOUS | Status: DC | PRN
  Administered 2021-08-20: 100 ug/kg/min via INTRAVENOUS

## 2021-08-20 MED ORDER — METOCLOPRAMIDE HCL 5 MG/ML IJ SOLN
10.0000 mg | Freq: Four times a day (QID) | INTRAMUSCULAR | Status: AC
  Administered 2021-08-20 (×2): 10 mg via INTRAVENOUS
  Filled 2021-08-20 (×2): qty 2

## 2021-08-20 MED ORDER — BISACODYL 5 MG PO TBEC
20.0000 mg | DELAYED_RELEASE_TABLET | Freq: Once | ORAL | Status: AC
  Administered 2021-08-20: 20 mg via ORAL
  Filled 2021-08-20: qty 4

## 2021-08-20 MED ORDER — PEG-KCL-NACL-NASULF-NA ASC-C 100 G PO SOLR: 0.5000 | Freq: Once | ORAL | Status: DC

## 2021-08-20 MED ORDER — PEG-KCL-NACL-NASULF-NA ASC-C 100 G PO SOLR: 1.0000 | Freq: Once | ORAL | Status: DC

## 2021-08-20 MED ORDER — PEG-KCL-NACL-NASULF-NA ASC-C 100 G PO SOLR
0.5000 | Freq: Once | ORAL | Status: AC
  Administered 2021-08-20: 100 g via ORAL
  Filled 2021-08-20: qty 1

## 2021-08-20 SURGICAL SUPPLY — 15 items

## 2021-08-20 NOTE — Op Note (Signed)
[facility] Patient Name: Justin [name] Procedure Date : 08/20/2021 MRN: [id number] Attending MD: Justice Britain , MD Date of Birth: October 19, 1955 CSN: [id number] Age: 66 Admit Type: Inpatient Procedure:                Upper GI endoscopy Indications:              Acute post hemorrhagic anemia, Iron deficiency                            anemia, Heme positive stool, Melena Providers:                Justice Britain, MD, Allayne Gitelman, RN, [facility]                            Technician, Technician Referring MD:             Hospitalists Medicines:                Monitored Anesthesia Care Complications:            No immediate complications. Estimated Blood Loss:     Estimated blood loss was minimal. Procedure:                Pre-Anesthesia Assessment:                           - Prior to the procedure, a History and Physical                            was performed, and patient medications and                            allergies were reviewed. The patient's tolerance of                            previous anesthesia was also reviewed. The risks                            and benefits of the procedure and the sedation                            options and risks were discussed with the patient.                            All questions were answered, and informed consent                            was obtained. Prior Anticoagulants: The patient has                            taken Eliquis (apixaban), last dose was 3 days                            prior to procedure. ASA Grade Assessment: III - A  patient with severe systemic disease. After                            reviewing the risks and benefits, the patient was                            deemed in satisfactory condition to undergo the                            procedure.                           After obtaining informed consent, the endoscope was                            passed under direct  vision. Throughout the                            procedure, the patient's blood pressure, pulse, and                            oxygen saturations were monitored continuously. The                            [id number]-[id number] ([id number]) Olympus endoscope was introduced                            through the mouth, and advanced to the second part                            of duodenum. The upper GI endoscopy was                            accomplished without difficulty. The patient                            tolerated the procedure. Scope In: Scope Out: Findings:      No gross lesions were noted in the proximal esophagus and in the mid       esophagus.      One tongue of salmon-colored mucosa was present from 36 to 38 cm. No       other visible abnormalities were present. Biopsies were taken with a       cold forceps for histology.      The Z-line was irregular and was found 38 cm from the incisors.      A 3 cm hiatal hernia was present.      A single spot with clot on it and after suction via endoscope saw       evidence of active slow oozing was found in the gastric antrum.       Fulguration to ablate the lesion by argon plasma was successful (gastric       settings).      A single spot with active slow oozing was found at the pylorus channel.       Fulguration to ablate the lesion to prevent bleeding by argon plasma was  successful (gastric settings).      Patchy mildly erythematous mucosa without bleeding was found in the       entire examined stomach. Biopsies were taken with a cold forceps for       histology and Helicobacter pylori testing.      No gross lesions were noted in the duodenal bulb, in the first portion       of the duodenum and in the second portion of the duodenum. Impression:               - No gross lesions in esophagus proximally.                            Salmon-colored mucosa suspicious for Barrett's                            esophagus distally - biopsied.                            - Z-line irregular, 38 cm from the incisors.                           - 3 cm hiatal hernia.                           - A single spot with clot that was removed and                            showed active oozing noted in antrum. Treated with                            argon plasma coagulation (APC).                           - A single spot with active ooze noted in pyloric                            channel. Treated with argon plasma coagulation                            (APC).                           - Erythematous mucosa in the stomach. Biopsied.                           - No gross lesions in the duodenal bulb, in the                            first portion of the duodenum and in the second                            portion of the duodenum. Recommendation:           - The patient will be observed post-procedure,  until all discharge criteria are met.                           - Return patient to hospital ward for ongoing care.                           - Findings today could explain anemia, though would                            have expected to see much more. Will offer patient                            Colonoscopy +/- VCE tomorrow to ensure nothing else                            is being missed. Ultimately defer to him, but with                            needs for anticoagulation probably best for [place] to                            do this workup while inhouse.                           - Clear liquid diet if agrees to plan noted above.                           - May continue PPI 40 mg twice daily for 1 month                            and then return to once daily.                           - Carafate twice daily for 1 month.                           - The findings and recommendations were discussed                            with the patient.                           - The findings and recommendations were discussed                             with the referring physician. Procedure Code(s):        --- Professional ---                           [id number], Esophagogastroduodenoscopy, flexible,                            transoral; with biopsy, single or multiple Diagnosis Code(s):        ---  Professional ---                           K22.8, Other specified diseases of esophagus                           K44.9, Diaphragmatic hernia without obstruction or                            gangrene                           K92.2, Gastrointestinal hemorrhage, unspecified                           K31.89, Other diseases of stomach and duodenum                           D62, Acute posthemorrhagic anemia                           D50.9, Iron deficiency anemia, unspecified                           R19.5, Other fecal abnormalities                           K92.1, Melena (includes Hematochezia) CPT copyright 2019 [employer]. All rights reserved. The codes documented in this report are preliminary and upon coder review may  be revised to meet current compliance requirements. Justice Britain, MD 08/20/2021 10:53:25 AM Number of Addenda: 0

## 2021-08-20 NOTE — Transfer of Care (Signed)
Immediate Anesthesia Transfer of Care Note  Patient: Justin [name]  Procedure(s) Performed: ESOPHAGOGASTRODUODENOSCOPY (EGD) WITH PROPOFOL BIOPSY HOT HEMOSTASIS (ARGON PLASMA COAGULATION/BICAP)  Patient Location: PACU  Anesthesia Type:MAC  Level of Consciousness: awake, alert  and oriented  Airway & Oxygen Therapy: Patient Spontanous Breathing  Post-op Assessment: Report given to RN, Post -op Vital signs reviewed and stable and Patient moving all extremities X 4  Post vital signs: Reviewed and stable  Last Vitals:  Vitals Value Taken Time  BP 96/55 08/20/21 1049  Temp    Pulse    Resp    SpO2    Vitals shown include unvalidated device data.  Last Pain:  Vitals:   08/20/21 0934  TempSrc: Temporal  PainSc: 0-No pain      Patients Stated Pain Goal: 6 (20/91/98 0221)  Complications: No notable events documented.

## 2021-08-20 NOTE — Progress Notes (Signed)
PROGRESS NOTE        PATIENT DETAILS Name: Justin [name] Age: 66 y.o. Sex: male Date of Birth: [birth year] Admit Date: 08/18/2021 Admitting Physician Kayleen Memos, DO [id number], Herbie [place], MD  Brief Summary: Patient is a 66 y.o.  male with history of essential thrombocythemia, A-fib on Eliquis, history of gastric ulcer, CAD s/p CABG-who presented with melanotic stools and acute blood loss anemia.  Significant events: 7/31>> admit-melanotic stools-acute blood loss anemia.  Significant studies: 7/31>> CXR: Pulmonary edema.  Significant microbiology data: 8/01>> blood culture: No growth. 8/01>> urine culture: Multiple species.  Procedures: 8/2>> EGD: Suspicion for Barrett's esophagus, single spot in the antrum with adherent clot-removed which showed active oozing-treated with APC.  Consults: GI  Subjective: Melanotic stools this morning.  Objective: Vitals: Blood pressure (!) 111/59, pulse 86, temperature 97.6 F (36.4 C), resp. rate 18, height _0  (1.727 m), weight 77.6 kg, SpO2 92 %.   Exam: Gen Exam:Alert awake-not in any distress HEENT:atraumatic, normocephalic Chest: B/L clear to auscultation anteriorly CVS:S1S2 regular Abdomen:soft non tender, non distended Extremities:no edema Neurology: Non focal Skin: no rash  Pertinent Labs/Radiology:    Latest Ref Rng & Units 08/20/2021    5:01 AM 08/19/2021   10:39 PM 08/19/2021    2:06 PM  CBC  WBC 4.0 - 10.5 K/uL 23.2     Hemoglobin 13.0 - 17.0 g/dL 7.7  9.3  9.7   Hematocrit 39.0 - 52.0 % 24.0  28.5  29.6   Platelets 150 - 400 K/uL 107       Lab Results  Component Value Date   NA 131 (L) 08/19/2021   K 4.4 08/19/2021   CL 101 08/19/2021   CO2 23 08/19/2021      Assessment/Plan: Upper GI bleeding with acute blood loss anemia: Continues to have melanotic stools-EGD as above-discussed with GI-EGD findings seems disproportionate to the severity of his anemia-plans are for  colonoscopy/possibly VCE study.  Continue to follow CBC and transfuse if significant drop in hemoglobin.  Remains on PPI.  Permanent atrial fibrillation: Continue telemetry monitoring-Eliquis on hold.  On Metoprolol.  CAD s/p CABG: No anginal symptoms-supportive care in the interim.  History of bilateral carotid artery stenosis-s/p CEA: Continue supportive care.  History of bioprosthetic aortic valve replacement  Chronic HFpEF: Euvolemic on exam  Essential thrombocythemia: Continue Hydrea  Chronic leukocytosis: Continue to follow CBC periodically  Hyponatremia: Mild-follow periodically.  BPH: Continue Flomax  BMI: Estimated body mass index is 26 kg/m as calculated from the following:   Height as of this encounter: _1  (1.727 m).   Weight as of this encounter: 77.6 kg.   Code status:   Code Status: Full Code   DVT Prophylaxis: SCDs Start: 08/18/21 2015   Family Communication: None at bedside   Disposition Plan: Status is: Inpatient Remains inpatient appropriate because: GI bleeding-colonoscopy scheduled for tomorrow.  May need transfusion if continues to have drop in hemoglobin.  Not yet stable for discharge.   Planned Discharge Destination:Home   Diet: Diet Order             Diet NPO time specified  Diet effective midnight                     Antimicrobial agents: Anti-infectives (From admission, onward)    None        MEDICATIONS:  Scheduled Meds:  [START ON 08/21/2021] bisacodyl  10 mg Oral Once   [START ON 08/21/2021] bisacodyl  10 mg Oral Once   [MAR Hold] gabapentin  300 mg Oral TID   [MAR Hold] hydroxyurea  500 mg Oral BID   [MAR Hold] metoprolol succinate  25 mg Oral BID   pantoprazole  40 mg Oral BID AC   peg 3350 powder  0.5 kit Oral Once   [START ON 08/21/2021] peg 3350 powder  0.5 kit Oral Once   [MAR Hold] rosuvastatin  20 mg Oral Daily   sucralfate  1 g Oral BID   [MAR Hold] tamsulosin  0.4 mg Oral Daily   [MAR Hold] zolpidem  10  mg Oral QHS   Continuous Infusions: PRN Meds:.[MAR Hold] acetaminophen, [MAR Hold] albuterol, [MAR Hold] oxyCODONE, [MAR Hold] polyethylene glycol   I have personally reviewed following labs and imaging studies  LABORATORY DATA: CBC: Recent Labs  Lab 08/14/21 1329 08/18/21 1302 08/18/21 1302 08/18/21 1602 08/19/21 0553 08/19/21 1406 08/19/21 2239 08/20/21 0501  WBC 32.9* 30.3*  --  27.9* 26.3*  --   --  23.2*  NEUTROABS 27.8* 25.4*  --  25.9* 22.4*  --   --   --   HGB 7.7* 7.0*  --  7.2* 8.6* 9.7* 9.3* 7.7*  HCT 24.2* 23.4*   < > 23.8* 26.5* 29.6* 28.5* 24.0*  MCV 106* 113.0*  --  113.3* 105.2*  --   --  104.8*  PLT 180 164  --  173 141*  --   --  107*   < > = values in this interval not displayed.    Basic Metabolic Panel: Recent Labs  Lab 08/14/21 1329 08/18/21 1302 08/18/21 1602 08/19/21 0553 08/19/21 1647  NA 134 137 134* 133* 131*  K 4.3 4.6 4.5 3.8 4.4  CL 101 105 102 104 101  CO2 18* 22 20* 22 23  GLUCOSE 123* 129* 106* 101* 121*  BUN _0 CREATININE 1.01 0.97 0.97 0.96 1.09  CALCIUM 8.2* 9.1 8.7* 8.4* 8.5*  MG  --   --   --  1.8  --   PHOS  --   --   --  4.9*  --     GFR: Estimated Creatinine Clearance: 63.6 mL/min (by C-G formula based on SCr of 1.09 mg/dL).  Liver Function Tests: Recent Labs  Lab 08/14/21 1329 08/18/21 1302 08/18/21 1602 08/19/21 0553  AST 37 _1 ALT _2 ALKPHOS 153* 103 101 99  BILITOT 1.1 1.1 1.0 1.5*  PROT 6.4 6.5 6.7 6.5  ALBUMIN 3.6* 3.5 2.9* 2.8*   No results for input(s): "LIPASE", "AMYLASE" in the last 168 hours. No results for input(s): "AMMONIA" in the last 168 hours.  Coagulation Profile: Recent Labs  Lab 08/18/21 1602  INR 1.8*    Cardiac Enzymes: No results for input(s): "CKTOTAL", "CKMB", "CKMBINDEX", "TROPONINI" in the last 168 hours.  BNP (last 3 results) No results for input(s): "PROBNP" in the last 8760 hours.  Lipid Profile: No results for input(s): "CHOL", "HDL",  "LDLCALC", "TRIG", "CHOLHDL", "LDLDIRECT" in the last 72 hours.  Thyroid Function Tests: No results for input(s): "TSH", "T4TOTAL", "FREET4", "T3FREE", "THYROIDAB" in the last 72 hours.  Anemia Panel: Recent Labs    08/18/21 1302 08/19/21 0553 08/19/21 1406  VITAMINB12  --   --  0,350*  FOLATE  --   --  19.0  FERRITIN 55  --   --  TIBC 343  --   --   IRON 34*  --   --   RETICCTPCT 7.3* 6.1*  --     Urine analysis:    Component Value Date/Time   COLORURINE YELLOW 08/19/2021 0121   APPEARANCEUR CLEAR 08/19/2021 0121   LABSPEC 1.006 08/19/2021 0121   PHURINE 5.0 08/19/2021 0121   GLUCOSEU NEGATIVE 08/19/2021 0121   HGBUR NEGATIVE 08/19/2021 0121   BILIRUBINUR NEGATIVE 08/19/2021 0121   KETONESUR NEGATIVE 08/19/2021 0121   PROTEINUR NEGATIVE 08/19/2021 0121   NITRITE NEGATIVE 08/19/2021 0121   LEUKOCYTESUR NEGATIVE 08/19/2021 0121    Sepsis Labs: Lactic Acid, Venous    Component Value Date/Time   LATICACIDVEN 2.8 ([place]) 10/04/2019 1759    MICROBIOLOGY: Recent Results (from the past 240 hour(s))  Urine Culture     Status: Abnormal   Collection Time: 08/19/21  1:21 AM   Specimen: Urine, Clean Catch  Result Value Ref Range Status   Specimen Description URINE, CLEAN CATCH  Final   Special Requests   Final    Immunocompromised Performed at [facility], [place] [address] [place], [place] [address]    Culture MULTIPLE SPECIES PRESENT, SUGGEST RECOLLECTION (A)  Final   Report Status 08/20/2021 FINAL  Final  Culture, blood (Routine X 2) w Reflex to ID Panel     Status: None (Preliminary result)   Collection Time: 08/19/21  5:53 AM   Specimen: BLOOD RIGHT WRIST  Result Value Ref Range Status   Specimen Description BLOOD RIGHT WRIST  Final   Special Requests   Final    BOTTLES DRAWN AEROBIC AND ANAEROBIC Blood Culture adequate volume   Culture   Final    NO GROWTH 1 DAY Performed at [facility], [place] [address] [place], [place] [address]    Report Status  PENDING  Incomplete  Culture, blood (Routine X 2) w Reflex to ID Panel     Status: None (Preliminary result)   Collection Time: 08/19/21  5:53 AM   Specimen: BLOOD LEFT ARM  Result Value Ref Range Status   Specimen Description BLOOD LEFT ARM  Final   Special Requests   Final    BOTTLES DRAWN AEROBIC AND ANAEROBIC Blood Culture adequate volume   Culture   Final    NO GROWTH 1 DAY Performed at [facility], [place] [address], [place], [place] [address]    Report Status PENDING  Incomplete    RADIOLOGY STUDIES/RESULTS: DG CHEST PORT 1 VIEW  Result Date: 08/18/2021 CLINICAL DATA:  Anemia EXAM: PORTABLE CHEST 1 VIEW COMPARISON:  10/10/2019, chest CT 10/04/2019, 10/19/2017 FINDINGS: Post sternotomy changes with atrial appendage clip. Cardiomegalywith vascular congestion and moderate diffuse interstitial and ground-glass opacities suspected to represent pulmonary edema. Probable trace left effusion. Aortic atherosclerosis. No pneumothorax IMPRESSION: Cardiomegaly with vascular congestion and diffuse interstitial and ground-glass opacity consistent with pulmonary edema. Probable small left effusion Electronically Signed   By: Donavan Foil M.D.   On: 08/18/2021 21:10     LOS: 2 days   Oren Binet, MD  [facility]    To contact the attending provider between 7A-7P or the covering provider during after hours 7P-7A, please log into the web site [email or web address] and access using universal [place] password for that web site. If you do not have the password, please call the hospital operator.  08/20/2021, 11:07 AM

## 2021-08-20 NOTE — Anesthesia Postprocedure Evaluation (Signed)
Anesthesia Post Note  Patient: Justin [name]  Procedure(s) Performed: ESOPHAGOGASTRODUODENOSCOPY (EGD) WITH PROPOFOL BIOPSY HOT HEMOSTASIS (ARGON PLASMA COAGULATION/BICAP)     Patient location during evaluation: PACU Anesthesia Type: MAC Level of consciousness: awake and alert Pain management: pain level controlled Vital Signs Assessment: post-procedure vital signs reviewed and stable Respiratory status: spontaneous breathing, nonlabored ventilation and respiratory function stable Cardiovascular status: blood pressure returned to baseline Postop Assessment: no apparent nausea or vomiting Anesthetic complications: no   No notable events documented.  Last Vitals:  Vitals:   08/20/21 1100 08/20/21 1115  BP: (!) 111/59 121/71  Pulse: 86 92  Resp: 18 20  Temp:    SpO2: 92% 94%    Last Pain:  Vitals:   08/20/21 1049  TempSrc:   PainSc: 0-No pain                 Marthenia Rolling

## 2021-08-20 NOTE — Interval H&P Note (Signed)
History and Physical Interval Note:  08/20/2021 10:00 AM  Justin [name]  has presented today for surgery, with the diagnosis of Macrocytic anemia, FOBT positive.  History gastric ulcers a few years ago.  The various methods of treatment have been discussed with the patient and family. After consideration of risks, benefits and other options for treatment, the patient has consented to  Procedure(s): ESOPHAGOGASTRODUODENOSCOPY (EGD) WITH PROPOFOL (N/A) as a surgical intervention.  The patient's history has been reviewed, patient examined, no change in status, stable for surgery.  I have reviewed the patient's chart and labs.  Questions were answered to the patient's satisfaction.     [employer]

## 2021-08-20 NOTE — Anesthesia Procedure Notes (Signed)
Procedure Name: MAC Date/Time: 08/20/2021 10:21 AM  Performed by: Kyung Rudd, CRNAPre-anesthesia Checklist: Patient identified, Emergency Drugs available, Suction available and Patient being monitored Patient Re-evaluated:Patient Re-evaluated prior to induction Oxygen Delivery Method: Nasal cannula Induction Type: IV induction Placement Confirmation: positive ETCO2 Dental Injury: Teeth and Oropharynx as per pre-operative assessment

## 2021-08-21 ENCOUNTER — Encounter (HOSPITAL_COMMUNITY)
Admission: EM | Disposition: A | Discharge status: Home / Self Care | Payer: Self-pay | Source: Home / Self Care | Attending: Internal Medicine

## 2021-08-21 ENCOUNTER — Inpatient Hospital Stay (HOSPITAL_COMMUNITY): Payer: Medicare Other | Admitting: Anesthesiology

## 2021-08-21 ENCOUNTER — Encounter (HOSPITAL_COMMUNITY): Payer: Self-pay | Admitting: Internal Medicine

## 2021-08-21 ENCOUNTER — Encounter: Payer: Self-pay | Admitting: Gastroenterology

## 2021-08-21 DIAGNOSIS — D62 Acute posthemorrhagic anemia: Secondary | ICD-10-CM

## 2021-08-21 DIAGNOSIS — K640 First degree hemorrhoids: Secondary | ICD-10-CM

## 2021-08-21 DIAGNOSIS — K5520 Angiodysplasia of colon without hemorrhage: Secondary | ICD-10-CM

## 2021-08-21 DIAGNOSIS — K921 Melena: Secondary | ICD-10-CM | POA: Diagnosis not present

## 2021-08-21 DIAGNOSIS — N400 Enlarged prostate without lower urinary tract symptoms: Secondary | ICD-10-CM | POA: Diagnosis not present

## 2021-08-21 DIAGNOSIS — I2510 Atherosclerotic heart disease of native coronary artery without angina pectoris: Secondary | ICD-10-CM

## 2021-08-21 DIAGNOSIS — I482 Chronic atrial fibrillation, unspecified: Secondary | ICD-10-CM | POA: Diagnosis not present

## 2021-08-21 HISTORY — PX: HOT HEMOSTASIS: SHX5433

## 2021-08-21 HISTORY — PX: COLONOSCOPY: SHX5424

## 2021-08-21 LAB — CBC
HCT: 27.1 % — ABNORMAL LOW (ref 39.0–52.0)
HCT: 26.5 % — ABNORMAL LOW (ref 39.0–52.0)
Hemoglobin: 8.7 g/dL — ABNORMAL LOW (ref 13.0–17.0)
Hemoglobin: 8.4 g/dL — ABNORMAL LOW (ref 13.0–17.0)
MCH: 33.3 pg (ref 26.0–34.0)
MCH: 33.6 pg (ref 26.0–34.0)
MCHC: 32.1 g/dL (ref 30.0–36.0)
MCHC: 31.7 g/dL (ref 30.0–36.0)
MCV: 103.8 fL — ABNORMAL HIGH (ref 80.0–100.0)
MCV: 106 fL — ABNORMAL HIGH (ref 80.0–100.0)
Platelets: 100 10*3/uL — ABNORMAL LOW (ref 150–400)
Platelets: 98 10*3/uL — ABNORMAL LOW (ref 150–400)
RBC: 2.61 MIL/uL — ABNORMAL LOW (ref 4.22–5.81)
RBC: 2.5 MIL/uL — ABNORMAL LOW (ref 4.22–5.81)
RDW: 23.2 % — ABNORMAL HIGH (ref 11.5–15.5)
RDW: 23.2 % — ABNORMAL HIGH (ref 11.5–15.5)
WBC: 23.1 10*3/uL — ABNORMAL HIGH (ref 4.0–10.5)
WBC: 22.2 10*3/uL — ABNORMAL HIGH (ref 4.0–10.5)
nRBC: 0.3 % — ABNORMAL HIGH (ref 0.0–0.2)
nRBC: 0.4 % — ABNORMAL HIGH (ref 0.0–0.2)

## 2021-08-21 LAB — BASIC METABOLIC PANEL
Anion gap: 9 (ref 5–15)
BUN: 11 mg/dL (ref 8–23)
CO2: 21 mmol/L — ABNORMAL LOW (ref 22–32)
Calcium: 8.1 mg/dL — ABNORMAL LOW (ref 8.9–10.3)
Chloride: 102 mmol/L (ref 98–111)
Creatinine, Ser: 0.91 mg/dL (ref 0.61–1.24)
GFR, Estimated: >60 mL/min (ref >60)
Glucose, Bld: 108 mg/dL — ABNORMAL HIGH (ref 70–99)
Potassium: 3.1 mmol/L — ABNORMAL LOW (ref 3.5–5.1)
Sodium: 132 mmol/L — ABNORMAL LOW (ref 135–145)

## 2021-08-21 LAB — GLUCOSE, CAPILLARY: Glucose-Capillary: 109 mg/dL — ABNORMAL HIGH (ref 70–99)

## 2021-08-21 LAB — SURGICAL PATHOLOGY

## 2021-08-21 SURGERY — COLONOSCOPY
Anesthesia: Monitor Anesthesia Care

## 2021-08-21 MED ORDER — PROPOFOL 10 MG/ML IV BOLUS
INTRAVENOUS | Status: DC | PRN
  Administered 2021-08-21: 20 mg via INTRAVENOUS

## 2021-08-21 MED ORDER — SODIUM CHLORIDE 0.9 % IV SOLN
INTRAVENOUS | Status: DC
Start: 2021-08-21 — End: 2021-08-21

## 2021-08-21 MED ORDER — POTASSIUM CHLORIDE CRYS ER 20 MEQ PO TBCR
40.0000 meq | EXTENDED_RELEASE_TABLET | Freq: Once | ORAL | Status: AC
  Administered 2021-08-21: 40 meq via ORAL
  Filled 2021-08-21: qty 2

## 2021-08-21 MED ORDER — ONDANSETRON HCL 4 MG/2ML IJ SOLN
INTRAMUSCULAR | Status: DC | PRN
  Administered 2021-08-21: 500 mg via INTRAVENOUS

## 2021-08-21 MED ORDER — LIP MEDEX EX OINT
TOPICAL_OINTMENT | CUTANEOUS | Status: DC | PRN
Start: 2021-08-21 — End: 2021-08-22

## 2021-08-21 MED ORDER — PROPOFOL 500 MG/50ML IV EMUL
INTRAVENOUS | Status: DC | PRN
  Administered 2021-08-21: 100 ug/kg/min via INTRAVENOUS

## 2021-08-21 MED ORDER — PHENYLEPHRINE 80 MCG/ML (10ML) SYRINGE FOR IV PUSH (FOR BLOOD PRESSURE SUPPORT)
PREFILLED_SYRINGE | INTRAVENOUS | Status: DC | PRN
  Administered 2021-08-21: 80 ug via INTRAVENOUS

## 2021-08-21 MED ORDER — PANTOPRAZOLE SODIUM 40 MG PO TBEC: 40.0000 mg | DELAYED_RELEASE_TABLET | Freq: Every day | ORAL | Status: DC

## 2021-08-21 NOTE — Anesthesia Preprocedure Evaluation (Addendum)
Anesthesia Evaluation  Patient identified by MRN, date of birth, ID band Patient awake    Reviewed: Allergy & Precautions, NPO status , Patient's Chart, lab work & pertinent test results  Airway Mallampati: I  TM Distance: >3 FB Neck ROM: Full    Dental  (+) Edentulous Upper, Edentulous Lower   Pulmonary sleep apnea , former smoker,    breath sounds clear to auscultation       Cardiovascular hypertension, Pt. on home beta blockers + CAD, + CABG and + Peripheral Vascular Disease  + dysrhythmias Atrial Fibrillation + Valvular Problems/Murmurs  Rhythm:Regular Rate:Normal  - s/p AVR   Neuro/Psych negative neurological ROS  negative psych ROS   GI/Hepatic Neg liver ROS, PUD,   Endo/Other  diabetes  Renal/GU Renal disease     Musculoskeletal  (+) Arthritis ,   Abdominal Normal abdominal exam  (+)   Peds  Hematology negative hematology ROS (+)   Anesthesia Other Findings   Reproductive/Obstetrics                            Anesthesia Physical Anesthesia Plan  ASA: 3  Anesthesia Plan: MAC   Post-op Pain Management:    Induction: Intravenous  PONV Risk Score and Plan: 0 and Propofol infusion  Airway Management Planned: Natural Airway and Simple Face Mask  Additional Equipment: None  Intra-op Plan:   Post-operative Plan:   Informed Consent: I have reviewed the patients History and Physical, chart, labs and discussed the procedure including the risks, benefits and alternatives for the proposed anesthesia with the patient or authorized representative who has indicated his/her understanding and acceptance.       Plan Discussed with: CRNA  Anesthesia Plan Comments:        Anesthesia Quick Evaluation

## 2021-08-21 NOTE — Interval H&P Note (Signed)
History and Physical Interval Note:  08/21/2021 3:40 PM  Justin [name]  has presented today for surgery, with the diagnosis of Dark stools, iron deficiency anemia, need for anticoagulation.  The various methods of treatment have been discussed with the patient and family. After consideration of risks, benefits and other options for treatment, the patient has consented to  Procedure(s): COLONOSCOPY (N/A) GIVENS CAPSULE STUDY (N/A) as a surgical intervention.  The patient's history has been reviewed, patient examined, no change in status, stable for surgery.  I have reviewed the patient's chart and labs.  Questions were answered to the patient's satisfaction.    EGD yesterday unremarkable for cause of GI blood loss.  Plan for colonoscopy today, and then VCE if no sources identified on colonoscopy  Justin [name]

## 2021-08-21 NOTE — Plan of Care (Signed)

## 2021-08-21 NOTE — Transfer of Care (Signed)
Immediate Anesthesia Transfer of Care Note  Patient: [name]  Procedure(s) Performed: COLONOSCOPY HOT HEMOSTASIS (ARGON PLASMA COAGULATION/BICAP)  Patient Location: PACU  Anesthesia Type:MAC  Level of Consciousness: drowsy  Airway & Oxygen Therapy: Patient Spontanous Breathing  Post-op Assessment: Report given to RN and Post -op Vital signs reviewed and stable  Post vital signs: Reviewed and stable  Last Vitals:  Vitals Value Taken Time  BP 119/105 08/21/21 1620  Temp 37.1 C 08/21/21 1619  Pulse 79 08/21/21 1620  Resp 15 08/21/21 1620  SpO2 93 % 08/21/21 1620  Vitals shown include unvalidated device data.  Last Pain:  Vitals:   08/21/21 1619  TempSrc: Oral  PainSc: 0-No pain      Patients Stated Pain Goal: 6 (74/71/85 5015)  Complications: No notable events documented.

## 2021-08-21 NOTE — Op Note (Signed)
[facility] Patient Name: Justin [name] Procedure Date : 08/21/2021 MRN: [id number] Attending MD: Gladstone Pih. Candis Schatz , MD Date of Birth: 1955-03-14 CSN: [id number] Age: 66 Admit Type: Inpatient Procedure:                Colonoscopy Indications:              Acute post hemorrhagic anemia Providers:                Nicki Reaper E. Candis Schatz, MD, Burtis Junes, RN, Gloris Ham, Technician Referring MD:              Medicines:                Monitored Anesthesia Care Complications:            No immediate complications. Estimated Blood Loss:     Estimated blood loss: none. Procedure:                Pre-Anesthesia Assessment:                           - Prior to the procedure, a History and Physical                            was performed, and patient medications and                            allergies were reviewed. The patient's tolerance of                            previous anesthesia was also reviewed. The risks                            and benefits of the procedure and the sedation                            options and risks were discussed with the patient.                            All questions were answered, and informed consent                            was obtained. Prior Anticoagulants: The patient has                            taken Eliquis (apixaban), last dose was 3 days                            prior to procedure. ASA Grade Assessment: III - A                            patient with severe systemic disease. After  reviewing the risks and benefits, the patient was                            deemed in satisfactory condition to undergo the                            procedure.                           After obtaining informed consent, the colonoscope                            was passed under direct vision. Throughout the                            procedure, the patient's blood pressure, pulse, and                             oxygen saturations were monitored continuously. The                            [id number] ([id number] Olympus coloscope was                            introduced through the anus and advanced to the the                            cecum, identified by appendiceal orifice and                            ileocecal valve. The colonoscopy was performed                            without difficulty. The patient tolerated the                            procedure well. The quality of the bowel                            preparation was adequate. The ileocecal valve,                            appendiceal orifice, and rectum were photographed. Scope In: 3:52:06 PM Scope Out: 4:12:24 PM Scope Withdrawal Time: 0 hours 10 minutes 40 seconds  Total Procedure Duration: 0 hours 20 minutes 18 seconds  Findings:      The perianal and digital rectal examinations were normal. Pertinent       negatives include normal sphincter tone and no palpable rectal lesions.      Two medium-sized angioectasias without bleeding were found in the       ascending colon. Coagulation for tissue destruction using argon plasma       was successful. Estimated blood loss: none.      The exam was otherwise normal throughout the examined colon.      Non-bleeding internal hemorrhoids were found during retroflexion. The  hemorrhoids were Grade I (internal hemorrhoids that do not prolapse).      No additional abnormalities were found on retroflexion. Impression:               - Two non-bleeding colonic angioectasias. Treated                            with argon plasma coagulation (APC). This is a                            possible source of GI blood loss                           - Non-bleeding internal hemorrhoids.                           - No specimens collected. Recommendation:           - Return patient to hospital ward for ongoing care.                           - Resume previous diet.                            - Resume Eliquis (apixaban) at prior dose in 2 days.                           - Recommend against any further colon cancer                            screening colonoscopies given patient's age and                            lack of precancerous lesions. Procedure Code(s):        --- Professional ---                           [phone], Colonoscopy, flexible; with ablation of                            tumor(s), polyp(s), or other lesion(s) (includes                            pre- and post-dilation and guide wire passage, when                            performed) Diagnosis Code(s):        --- Professional ---                           K55.20, Angiodysplasia of colon without hemorrhage                           K64.0, First degree hemorrhoids                           D62, Acute posthemorrhagic anemia CPT  copyright 2019 [employer] Medical Association. All rights reserved. The codes documented in this report are preliminary and upon coder review may  be revised to meet current compliance requirements. Harjit Douds E. Candis Schatz, MD 08/21/2021 4:28:46 PM This report has been signed electronically. Number of Addenda: 0

## 2021-08-21 NOTE — Progress Notes (Signed)
PROGRESS NOTE        PATIENT DETAILS Name: Justin [name] Age: 66 y.o. Sex: male Date of Birth: November 05, 1955 Admit Date: 08/18/2021 Admitting Physician Kayleen Memos, DO TGG:YIRSWNI, Herbie [place], MD  Brief Summary: Patient is a 66 y.o.  male with history of essential thrombocythemia, A-fib on Eliquis, history of gastric ulcer, CAD s/p CABG-who presented with melanotic stools and acute blood loss anemia.  Significant events: 7/31>> admit-melanotic stools-acute blood loss anemia.  Significant studies: 7/31>> CXR: Pulmonary edema.  Significant microbiology data: 8/01>> blood culture: No growth. 8/01>> urine culture: Multiple species.  Procedures: 8/2>> EGD: Suspicion for Barrett's esophagus, single spot in the antrum with adherent clot-removed which showed active oozing-treated with APC.  Consults: GI  Subjective: Clear watery stools with colonoscopy prep.  Objective: Vitals: Blood pressure 118/65, pulse 78, temperature 97.8 F (36.6 C), temperature source Oral, resp. rate 16, height '5\' 8"'$  (1.727 m), weight 77.6 kg, SpO2 90 %.   Exam: Gen Exam:Alert awake-not in any distress HEENT:atraumatic, normocephalic Chest: B/L clear to auscultation anteriorly CVS:S1S2 regular Abdomen:soft non tender, non distended Extremities:no edema Neurology: Non focal Skin: no rash   Pertinent Labs/Radiology:    Latest Ref Rng & Units 08/21/2021    5:15 AM 08/20/2021    4:41 PM 08/20/2021    5:01 AM  CBC  WBC 4.0 - 10.5 K/uL 23.1  26.9  23.2   Hemoglobin 13.0 - 17.0 g/dL 8.7  8.9  7.7   Hematocrit 39.0 - 52.0 % 27.1  27.8  24.0   Platelets 150 - 400 K/uL 100  118  107     Lab Results  Component Value Date   NA 132 (L) 08/21/2021   K 3.1 (L) 08/21/2021   CL 102 08/21/2021   CO2 21 (L) 08/21/2021      Assessment/Plan: Upper GI bleeding with acute blood loss anemia: No further melanotic stools-Per GI-bleeding disproportionate to minimal bleeding seen on EGD.   Colonoscopy scheduled for later today.  Had clear liquid stools with colonoscopy prep.  Hemoglobin stable.   Continue PPI.  Continue to follow CBC  Permanent atrial fibrillation: Rate controlled with metoprolol-continue to telemetry monitoring.  Eliquis on hold.    CAD s/p CABG: No anginal symptoms-supportive care in the interim.  History of bilateral carotid artery stenosis-s/p CEA: Continue supportive care.  History of bioprosthetic aortic valve replacement  Chronic HFpEF: Euvolemic on exam  Essential thrombocythemia: Continue Hydrea  Chronic leukocytosis: Continue to follow CBC periodically  Hyponatremia: Mild-follow periodically.  Hypokalemia: Replete and recheck.  BPH: Continue Flomax  BMI: Estimated body mass index is 26 kg/m as calculated from the following:   Height as of this encounter: '5\' 8"'$  (1.727 m).   Weight as of this encounter: 77.6 kg.   Code status:   Code Status: Full Code   DVT Prophylaxis: SCDs Start: 08/18/21 2015   Family Communication: None at bedside   Disposition Plan: Status is: Inpatient Remains inpatient appropriate because: GI bleeding-colonoscopy scheduled for later today.  Planned Discharge Destination:Home   Diet: Diet Order             Diet NPO time specified  Diet effective 0500 tomorrow                     Antimicrobial agents: Anti-infectives (From admission, onward)    None  MEDICATIONS: Scheduled Meds:  gabapentin  300 mg Oral TID   hydroxyurea  500 mg Oral BID   metoprolol succinate  25 mg Oral BID   pantoprazole  40 mg Oral BID AC   [START ON 09/20/2021] pantoprazole  40 mg Oral Daily   rosuvastatin  20 mg Oral Daily   sucralfate  1 g Oral BID   tamsulosin  0.4 mg Oral Daily   zolpidem  10 mg Oral QHS   Continuous Infusions:  sodium chloride 20 mL/hr at 08/21/21 0649   PRN Meds:.acetaminophen, albuterol, lip balm, oxyCODONE, polyethylene glycol   I have personally reviewed following labs  and imaging studies  LABORATORY DATA: CBC: Recent Labs  Lab 08/14/21 1329 08/18/21 1302 08/18/21 1302 08/18/21 1602 08/19/21 0553 08/19/21 1406 08/19/21 2239 08/20/21 0501 08/20/21 1641 08/21/21 0515  WBC 32.9* 30.3*  --  27.9* 26.3*  --   --  23.2* 26.9* 23.1*  NEUTROABS 27.8* 25.4*  --  25.9* 22.4*  --   --   --   --   --   HGB 7.7* 7.0*   < > 7.2* 8.6* 9.7* 9.3* 7.7* 8.9* 8.7*  HCT 24.2* 23.4*   < > 23.8* 26.5* 29.6* 28.5* 24.0* 27.8* 27.1*  MCV 106* 113.0*   < > 113.3* 105.2*  --   --  104.8* 104.1* 103.8*  PLT 180 164  --  173 141*  --   --  107* 118* 100*   < > = values in this interval not displayed.     Basic Metabolic Panel: Recent Labs  Lab 08/18/21 1302 08/18/21 1602 08/19/21 0553 08/19/21 1647 08/21/21 0515  NA 137 134* 133* 131* 132*  K 4.6 4.5 3.8 4.4 3.1*  CL 105 102 104 101 102  CO2 22 20* 22 23 21*  GLUCOSE 129* 106* 101* 121* 108*  BUN '14 13 12 10 11  '$ CREATININE 0.97 0.97 0.96 1.09 0.91  CALCIUM 9.1 8.7* 8.4* 8.5* 8.1*  MG  --   --  1.8  --   --   PHOS  --   --  4.9*  --   --      GFR: Estimated Creatinine Clearance: 76.2 mL/min (by C-G formula based on SCr of 0.91 mg/dL).  Liver Function Tests: Recent Labs  Lab 08/14/21 1329 08/18/21 1302 08/18/21 1602 08/19/21 0553  AST 37 '22 26 27  '$ ALT '14 10 14 14  '$ ALKPHOS 153* 103 101 99  BILITOT 1.1 1.1 1.0 1.5*  PROT 6.4 6.5 6.7 6.5  ALBUMIN 3.6* 3.5 2.9* 2.8*    No results for input(s): "LIPASE", "AMYLASE" in the last 168 hours. No results for input(s): "AMMONIA" in the last 168 hours.  Coagulation Profile: Recent Labs  Lab 08/18/21 1602  INR 1.8*     Cardiac Enzymes: No results for input(s): "CKTOTAL", "CKMB", "CKMBINDEX", "TROPONINI" in the last 168 hours.  BNP (last 3 results) No results for input(s): "PROBNP" in the last 8760 hours.  Lipid Profile: No results for input(s): "CHOL", "HDL", "LDLCALC", "TRIG", "CHOLHDL", "LDLDIRECT" in the last 72 hours.  Thyroid Function  Tests: No results for input(s): "TSH", "T4TOTAL", "FREET4", "T3FREE", "THYROIDAB" in the last 72 hours.  Anemia Panel: Recent Labs    08/18/21 1302 08/19/21 0553 08/19/21 1406  VITAMINB12  --   --  4,876*  FOLATE  --   --  19.0  FERRITIN 55  --   --   TIBC 343  --   --   IRON 34*  --   --  RETICCTPCT 7.3* 6.1*  --      Urine analysis:    Component Value Date/Time   COLORURINE YELLOW 08/19/2021 0121   APPEARANCEUR CLEAR 08/19/2021 0121   LABSPEC 1.006 08/19/2021 0121   PHURINE 5.0 08/19/2021 0121   GLUCOSEU NEGATIVE 08/19/2021 0121   HGBUR NEGATIVE 08/19/2021 0121   BILIRUBINUR NEGATIVE 08/19/2021 0121   KETONESUR NEGATIVE 08/19/2021 0121   PROTEINUR NEGATIVE 08/19/2021 0121   NITRITE NEGATIVE 08/19/2021 0121   LEUKOCYTESUR NEGATIVE 08/19/2021 0121    Sepsis Labs: Lactic Acid, Venous    Component Value Date/Time   LATICACIDVEN 2.8 ([place]) 10/04/2019 1759    MICROBIOLOGY: Recent Results (from the past 240 hour(s))  Urine Culture     Status: Abnormal   Collection Time: 08/19/21  1:21 AM   Specimen: Urine, Clean Catch  Result Value Ref Range Status   Specimen Description URINE, CLEAN CATCH  Final   Special Requests   Final    Immunocompromised Performed at [facility], [place] [address], [place], [place] [address]    Culture MULTIPLE SPECIES PRESENT, SUGGEST RECOLLECTION (A)  Final   Report Status 08/20/2021 FINAL  Final  Culture, blood (Routine X 2) w Reflex to ID Panel     Status: None (Preliminary result)   Collection Time: 08/19/21  5:53 AM   Specimen: BLOOD RIGHT WRIST  Result Value Ref Range Status   Specimen Description BLOOD RIGHT WRIST  Final   Special Requests   Final    BOTTLES DRAWN AEROBIC AND ANAEROBIC Blood Culture adequate volume   Culture   Final    NO GROWTH 1 DAY Performed at [facility], [place] [address], [place], [place] [address]    Report Status PENDING  Incomplete  Culture, blood (Routine X 2) w Reflex to ID Panel      Status: None (Preliminary result)   Collection Time: 08/19/21  5:53 AM   Specimen: BLOOD LEFT ARM  Result Value Ref Range Status   Specimen Description BLOOD LEFT ARM  Final   Special Requests   Final    BOTTLES DRAWN AEROBIC AND ANAEROBIC Blood Culture adequate volume   Culture   Final    NO GROWTH 1 DAY Performed at [facility], [place] [address] [place], [place] [address]    Report Status PENDING  Incomplete    RADIOLOGY STUDIES/RESULTS: No results found.   LOS: 3 days   Oren Binet, MD  [facility]    To contact the attending provider between 7A-7P or the covering provider during after hours 7P-7A, please log into the web site [email or web address] and access using universal [place] password for that web site. If you do not have the password, please call the hospital operator.  08/21/2021, 11:47 AM

## 2021-08-22 ENCOUNTER — Other Ambulatory Visit (HOSPITAL_COMMUNITY): Payer: Self-pay

## 2021-08-22 ENCOUNTER — Encounter: Payer: Self-pay | Admitting: Family

## 2021-08-22 ENCOUNTER — Encounter (HOSPITAL_COMMUNITY): Payer: Self-pay | Admitting: Gastroenterology

## 2021-08-22 DIAGNOSIS — D473 Essential (hemorrhagic) thrombocythemia: Secondary | ICD-10-CM

## 2021-08-22 DIAGNOSIS — D62 Acute posthemorrhagic anemia: Secondary | ICD-10-CM | POA: Diagnosis not present

## 2021-08-22 DIAGNOSIS — K921 Melena: Secondary | ICD-10-CM | POA: Diagnosis not present

## 2021-08-22 LAB — BASIC METABOLIC PANEL
Anion gap: 6 (ref 5–15)
BUN: 10 mg/dL (ref 8–23)
CO2: 22 mmol/L (ref 22–32)
Calcium: 8.3 mg/dL — ABNORMAL LOW (ref 8.9–10.3)
Chloride: 105 mmol/L (ref 98–111)
Creatinine, Ser: 0.95 mg/dL (ref 0.61–1.24)
GFR, Estimated: >60 mL/min (ref >60)
Glucose, Bld: 102 mg/dL — ABNORMAL HIGH (ref 70–99)
Potassium: 3.8 mmol/L (ref 3.5–5.1)
Sodium: 133 mmol/L — ABNORMAL LOW (ref 135–145)

## 2021-08-22 LAB — CBC
HCT: 26.3 % — ABNORMAL LOW (ref 39.0–52.0)
Hemoglobin: 8.4 g/dL — ABNORMAL LOW (ref 13.0–17.0)
MCH: 33.7 pg (ref 26.0–34.0)
MCHC: 31.9 g/dL (ref 30.0–36.0)
MCV: 105.6 fL — ABNORMAL HIGH (ref 80.0–100.0)
Platelets: 98 10*3/uL — ABNORMAL LOW (ref 150–400)
RBC: 2.49 MIL/uL — ABNORMAL LOW (ref 4.22–5.81)
RDW: 23.3 % — ABNORMAL HIGH (ref 11.5–15.5)
WBC: 23 10*3/uL — ABNORMAL HIGH (ref 4.0–10.5)
nRBC: 0.4 % — ABNORMAL HIGH (ref 0.0–0.2)

## 2021-08-22 LAB — MAGNESIUM: Magnesium: 1.8 mg/dL (ref 1.7–2.4)

## 2021-08-22 MED ORDER — ELIQUIS 5 MG PO TABS: 5.0000 mg | ORAL_TABLET | Freq: Two times a day (BID) | ORAL | Status: DC

## 2021-08-22 MED ORDER — SUCRALFATE 1 G PO TABS
1.0000 g | ORAL_TABLET | Freq: Two times a day (BID) | ORAL | 0 refills | Status: DC
  Filled 2021-08-22: qty 60, 30d supply, fill #0

## 2021-08-22 MED ORDER — PANTOPRAZOLE SODIUM 40 MG PO TBEC
40.0000 mg | DELAYED_RELEASE_TABLET | Freq: Two times a day (BID) | ORAL | 3 refills | Status: DC
  Filled 2021-08-22: qty 90, 45d supply, fill #0

## 2021-08-22 NOTE — Plan of Care (Signed)
  Problem: Education: Goal: Knowledge of General Education information will improve Description: Including pain rating scale, medication(s)/side effects and non-pharmacologic comfort measures Outcome: Progressing   Problem: Activity: Goal: Risk for activity intolerance will decrease Outcome: Progressing   Problem: Nutrition: Goal: Adequate nutrition will be maintained Outcome: Progressing   Problem: Coping: Goal: Level of anxiety will decrease Outcome: Progressing   

## 2021-08-22 NOTE — Care Management Important Message (Signed)
Important Message  Patient Details  Name: Justin [name] MRN: [id number] Date of Birth: [birth year]   Medicare Important Message Given:  Yes     Orbie Pyo 08/22/2021, 2:44 PM

## 2021-08-22 NOTE — Anesthesia Postprocedure Evaluation (Signed)
Anesthesia Post Note  Patient: Justin [name]  Procedure(s) Performed: COLONOSCOPY HOT HEMOSTASIS (ARGON PLASMA COAGULATION/BICAP)     Patient location during evaluation: Endoscopy Anesthesia Type: MAC Level of consciousness: awake and alert Pain management: pain level controlled Vital Signs Assessment: post-procedure vital signs reviewed and stable Respiratory status: spontaneous breathing, nonlabored ventilation, respiratory function stable and patient connected to nasal cannula oxygen Cardiovascular status: stable and blood pressure returned to baseline Postop Assessment: no apparent nausea or vomiting Anesthetic complications: no   No notable events documented.              Effie Berkshire

## 2021-08-22 NOTE — Discharge Summary (Signed)
PATIENT DETAILS Name: Justin [name] Age: 66 y.o. Sex: male Date of Birth: [birth year] MRN: [id number]. Admitting Physician: Kayleen Memos, DO JYN:[id number], Herbie [place], MD  Admit Date: 08/18/2021 Discharge date: 08/22/2021  Recommendations for Outpatient Follow-up:  Follow up with PCP in 1-2 weeks Please obtain CMP/CBC in one week  Admitted From:  Home  Disposition: Home   Discharge Condition: good  CODE STATUS:   Code Status: Full Code   Diet recommendation:  Diet Order             Diet - low sodium heart healthy           Diet Heart Room service appropriate? Yes; Fluid consistency: Thin  Diet effective now                    Brief Summary: Patient is a 66 y.o.  male with history of essential thrombocythemia, A-fib on Eliquis, history of gastric ulcer, CAD s/p CABG-who presented with melanotic stools and acute blood loss anemia.   Significant events: 7/31>> admit-melanotic stools-acute blood loss anemia.   Significant studies: 7/31>> CXR: Pulmonary edema.   Significant microbiology data: 8/01>> blood culture: No growth. 8/01>> urine culture: Multiple species.   Procedures: 8/2>> EGD: Suspicion for Barrett's esophagus, single spot in the antrum with adherent clot-removed which showed active oozing-treated with APC. 8/3>> colonoscopy: 2 nonbleeding AVMs.  Nonbleeding internal hemorrhoids.   Consults: GI  Brief Hospital Course: Upper GI bleeding with acute blood loss anemia: Resolved-no further melanotic stools-etiology likely due to AVMs.  EGD/colonoscopy as above.  Hemoglobin stable-Per GI-okay to resume Eliquis starting 8/6.  PCP to follow and repeat CBC in 1 week.  EGD biopsy results were negative for H. pylori.  Esophageal biopsy was negative for intestinal metaplasia or dysplasia.  GI recommending PPI twice daily x4 weeks then changing to daily dosing, GI also recommending Carafate twice daily x1 month.  Permanent atrial fibrillation: Rate controlled  with metoprolol-continue to telemetry monitoring.  Eliquis on hold-okay to resume on 8/6.   CAD s/p CABG: No anginal symptoms-supportive care in the interim.   History of bilateral carotid artery stenosis-s/p CEA: Continue supportive care.   History of bioprosthetic aortic valve replacement   Chronic HFpEF: Euvolemic on exam   Essential thrombocythemia: Continue Hydrea   Chronic leukocytosis: Continue to follow CBC periodically  Hyponatremia: Mild-follow periodically.   Hypokalemia: Repleted.   BPH: Continue Flomax   BMI: Estimated body mass index is 26 kg/m as calculated from the following:   Height as of this encounter: '5\' 8"'$  (1.727 m).   Weight as of this encounter: 77.6 kg.   Discharge Diagnoses:  Principal Problem:   GI bleed   Discharge Instructions:  Activity:  As tolerated   Discharge Instructions     Call MD for:   Complete by: As directed    Black/bloody stools or if you vomit blood   Call MD for:  extreme fatigue   Complete by: As directed    Call MD for:  persistant dizziness or light-headedness   Complete by: As directed    Diet - low sodium heart healthy   Complete by: As directed    Increase activity slowly   Complete by: As directed       Allergies as of 08/22/2021       Reactions   Codeine Swelling   Tolerates hydrocodone, tramadol, and oxycodone    Dilaudid [hydromorphone] Anxiety   Hallucinations    Doxycycline Itching, Rash  Medication List     TAKE these medications    albuterol 108 (90 Base) MCG/ACT inhaler Commonly known as: VENTOLIN HFA Inhale 1 puff into the lungs every 6 (six) hours as needed for shortness of breath.   BLUE-EMU PAIN RELIEF DRY EX Apply 1 application topically daily as needed (pain).   Centrum Adults Tabs Take 1 capsule by mouth daily.   Cholecalciferol 125 MCG (5000 UT) capsule Take 5,000 Units by mouth daily.   cyclobenzaprine 10 MG tablet Commonly known as: FLEXERIL Take 10 mg by mouth  3 (three) times daily.   docusate sodium 100 MG capsule Commonly known as: COLACE Take 1 capsule (100 mg total) by mouth 2 (two) times daily. What changed:  when to take this reasons to take this   Eliquis 5 MG Tabs tablet Generic drug: apixaban Take 1 tablet (5 mg total) by mouth 2 (two) times daily. Start taking on: August 24, 2021 What changed: These instructions start on August 24, 2021. If you are unsure what to do until then, ask your doctor or other care provider.   ferrous sulfate 325 (65 FE) MG tablet Take 1 tablet (325 mg total) by mouth 2 (two) times daily with a meal.   furosemide 20 MG tablet Commonly known as: LASIX Take 20 mg by mouth daily as needed for fluid.   gabapentin 300 MG capsule Commonly known as: NEURONTIN Take 300 mg by mouth 3 (three) times daily.   HYDROcodone-acetaminophen 7.5-325 MG tablet Commonly known as: NORCO Take 1-2 tablets by mouth every 4 (four) hours as needed for moderate pain.   hydroxyurea 500 MG capsule Commonly known as: HYDREA TAKE 1 CAPSULE BY MOUTH 2 TIMES DAILY. MAY TAKE WITH FOOD TO MINIMIZE GI SIDE EFFECTS. What changed:  how much to take how to take this when to take this additional instructions   loratadine 10 MG tablet Commonly known as: CLARITIN Take 10 mg by mouth daily.   metoprolol succinate 25 MG 24 hr tablet Commonly known as: TOPROL-XL Take 25 mg by mouth 2 (two) times daily.   pantoprazole 40 MG tablet Commonly known as: PROTONIX Take 1 tablet (40 mg total) by mouth 2 (two) times daily. Twice daily x4 weeks-then daily. What changed:  when to take this additional instructions   potassium chloride 10 MEQ CR capsule Commonly known as: MICRO-K Take 10 mEq by mouth daily as needed (take when taking lasix).   pregabalin 50 MG capsule Commonly known as: LYRICA Take 50 mg by mouth 3 (three) times daily. Patient is only taking one tablet daily   REFRESH TEARS OP Place 1-2 drops into both eyes daily as  needed (dry eyes).   rosuvastatin 20 MG tablet Commonly known as: CRESTOR TAKE 1 TABLET BY MOUTH EVERY DAY IN THE EVENING What changed:  how much to take how to take this when to take this   senna-docusate 8.6-50 MG tablet Commonly known as: Senokot-S Take 1 tablet by mouth 2 (two) times daily.   sucralfate 1 GM/10ML suspension Commonly known as: CARAFATE Take 10 mLs (1 g total) by mouth 2 (two) times daily.   tamsulosin 0.4 MG Caps capsule Commonly known as: FLOMAX Take 1 capsule (0.4 mg total) by mouth daily.   zolpidem 10 MG tablet Commonly known as: AMBIEN Take 10 mg by mouth at bedtime.        Follow-up Information     [name], MD. Schedule an appointment as soon as possible for a visit in 1 week(s).  Specialty: Family Medicine Contact information: [address], [address] [place] [address] [phone]         Buford Dresser, MD Follow up on 08/22/2021.   Specialty: Cardiology Contact information: [address] [place] [address] [phone]                Allergies  Allergen Reactions   Codeine Swelling    Tolerates hydrocodone, tramadol, and oxycodone    Dilaudid [Hydromorphone] Anxiety    Hallucinations    Doxycycline Itching and Rash     Other Procedures/Studies: DG CHEST PORT 1 VIEW  Result Date: 08/18/2021 CLINICAL DATA:  Anemia EXAM: PORTABLE CHEST 1 VIEW COMPARISON:  10/10/2019, chest CT 10/04/2019, 10/19/2017 FINDINGS: Post sternotomy changes with atrial appendage clip. Cardiomegalywith vascular congestion and moderate diffuse interstitial and ground-glass opacities suspected to represent pulmonary edema. Probable trace left effusion. Aortic atherosclerosis. No pneumothorax IMPRESSION: Cardiomegaly with vascular congestion and diffuse interstitial and ground-glass opacity consistent with pulmonary edema. Probable small left effusion Electronically Signed   By: Donavan Foil M.D.   On: 08/18/2021 21:10      TODAY-DAY OF DISCHARGE:  Subjective:   Iran Sizer today has no headache,no chest abdominal pain,no new weakness tingling or numbness, feels much better wants to go home today.   Objective:   Blood pressure 118/66, pulse 79, temperature 97.9 F (36.6 C), temperature source Oral, resp. rate 20, height '5\' 8"'$  (1.727 m), weight 78.1 kg, SpO2 94 %.  Intake/Output Summary (Last 24 hours) at 08/22/2021 0951 Last data filed at 08/21/2021 1800 Gross per 24 hour  Intake 429.35 ml  Output --  Net 429.35 ml   Filed Weights   08/20/21 0331 08/20/21 0934 08/22/21 0500  Weight: 76.3 kg 77.6 kg 78.1 kg    Exam: Awake Alert, Oriented *3, No new F.N deficits, Normal affect [place].AT,PERRAL Supple Neck,No JVD, No cervical lymphadenopathy appriciated.  Symmetrical Chest wall movement, Good air movement bilaterally, CTAB RRR,No Gallops,Rubs or new Murmurs, No Parasternal Heave +ve B.Sounds, Abd Soft, Non tender, No organomegaly appriciated, No rebound -guarding or rigidity. No Cyanosis, Clubbing or edema, No new Rash or bruise   PERTINENT RADIOLOGIC STUDIES: No results found.   PERTINENT LAB RESULTS: CBC: Recent Labs    08/21/21 1648 08/22/21 0355  WBC 22.2* 23.0*  HGB 8.4* 8.4*  HCT 26.5* 26.3*  PLT 98* 98*   CMET CMP     Component Value Date/Time   NA 133 (L) 08/22/2021 0355   NA 134 08/14/2021 1329   K 3.8 08/22/2021 0355   CL 105 08/22/2021 0355   CO2 22 08/22/2021 0355   GLUCOSE 102 (H) 08/22/2021 0355   BUN 10 08/22/2021 0355   BUN 15 08/14/2021 1329   CREATININE 0.95 08/22/2021 0355   CREATININE 0.97 08/18/2021 1302   CALCIUM 8.3 (L) 08/22/2021 0355   PROT 6.5 08/19/2021 0553   PROT 6.4 08/14/2021 1329   ALBUMIN 2.8 (L) 08/19/2021 0553   ALBUMIN 3.6 (L) 08/14/2021 1329   AST 27 08/19/2021 0553   AST 22 08/18/2021 1302   ALT 14 08/19/2021 0553   ALT 10 08/18/2021 1302   ALKPHOS 99 08/19/2021 0553   BILITOT 1.5 (H) 08/19/2021 0553   BILITOT 1.1 08/18/2021 1302    GFRNONAA >60 08/22/2021 0355   GFRNONAA >60 08/18/2021 1302   GFRAA >60 10/12/2019 0805   GFRAA >60 07/11/2019 0952    GFR Estimated Creatinine Clearance: 73 mL/min (by C-G formula based on SCr of 0.95 mg/dL). No results for  input(s): "LIPASE", "AMYLASE" in the last 72 hours. No results for input(s): "CKTOTAL", "CKMB", "CKMBINDEX", "TROPONINI" in the last 72 hours. Invalid input(s): "POCBNP" No results for input(s): "DDIMER" in the last 72 hours. No results for input(s): "HGBA1C" in the last 72 hours. No results for input(s): "CHOL", "HDL", "LDLCALC", "TRIG", "CHOLHDL", "LDLDIRECT" in the last 72 hours. No results for input(s): "TSH", "T4TOTAL", "T3FREE", "THYROIDAB" in the last 72 hours.  Invalid input(s): "FREET3" Recent Labs    08/19/21 1406  VITAMINB12 4,876*  FOLATE 19.0   Coags: No results for input(s): "INR" in the last 72 hours.  Invalid input(s): "PT" Microbiology: Recent Results (from the past 240 hour(s))  Urine Culture     Status: Abnormal   Collection Time: 08/19/21  1:21 AM   Specimen: Urine, Clean Catch  Result Value Ref Range Status   Specimen Description URINE, CLEAN CATCH  Final   Special Requests   Final    Immunocompromised Performed at [facility], [address] [place], [place] [address]    Culture MULTIPLE SPECIES PRESENT, SUGGEST RECOLLECTION (A)  Final   Report Status 08/20/2021 FINAL  Final  Culture, blood (Routine X 2) w Reflex to ID Panel     Status: None (Preliminary result)   Collection Time: 08/19/21  5:53 AM   Specimen: BLOOD RIGHT WRIST  Result Value Ref Range Status   Specimen Description BLOOD RIGHT WRIST  Final   Special Requests   Final    BOTTLES DRAWN AEROBIC AND ANAEROBIC Blood Culture adequate volume   Culture   Final    NO GROWTH 2 DAYS Performed at [facility], [place] [address], [place], [place] [address]    Report Status PENDING  Incomplete  Culture, blood (Routine X 2) w Reflex to ID Panel     Status: None  (Preliminary result)   Collection Time: 08/19/21  5:53 AM   Specimen: BLOOD LEFT ARM  Result Value Ref Range Status   Specimen Description BLOOD LEFT ARM  Final   Special Requests   Final    BOTTLES DRAWN AEROBIC AND ANAEROBIC Blood Culture adequate volume   Culture   Final    NO GROWTH 2 DAYS Performed at [facility], [place] [address], [place], [place] [address]    Report Status PENDING  Incomplete    FURTHER DISCHARGE INSTRUCTIONS:  Get Medicines reviewed and adjusted: Please take all your medications with you for your next visit with your Primary MD  Laboratory/radiological data: Please request your Primary MD to go over all hospital tests and procedure/radiological results at the follow up, please ask your Primary MD to get all Hospital records sent to his/her office.  In some cases, they will be blood work, cultures and biopsy results pending at the time of your discharge. Please request that your primary care M.D. goes through all the records of your hospital data and follows up on these results.  Also Note the following: If you experience worsening of your admission symptoms, develop shortness of breath, life threatening emergency, suicidal or homicidal thoughts you must seek medical attention immediately by calling 911 or calling your MD immediately  if symptoms less severe.  You must read complete instructions/literature along with all the possible adverse reactions/side effects for all the Medicines you take and that have been prescribed to you. Take any new Medicines after you have completely understood and accpet all the possible adverse reactions/side effects.   Do not drive when taking Pain medications or  sleeping medications (Benzodaizepines)  Do not take more than prescribed Pain, Sleep and Anxiety Medications. It is not advisable to combine anxiety,sleep and pain medications without talking with your primary care practitioner  Special Instructions: If you have  smoked or chewed Tobacco  in the last 2 yrs please stop smoking, stop any regular Alcohol  and or any Recreational drug use.  Wear Seat belts while driving.  Please note: You were cared for by a hospitalist during your hospital stay. Once you are discharged, your primary care physician will handle any further medical issues. Please note that NO REFILLS for any discharge medications will be authorized once you are discharged, as it is imperative that you return to your primary care physician (or establish a relationship with a primary care physician if you do not have one) for your post hospital discharge needs so that they can reassess your need for medications and monitor your lab values.  Total Time spent coordinating discharge including counseling, education and face to face time equals greater than 30 minutes.  SignedOren Binet 08/22/2021 9:51 AM

## 2021-08-23 ENCOUNTER — Encounter (HOSPITAL_COMMUNITY): Payer: Self-pay | Admitting: Gastroenterology

## 2021-08-24 LAB — CULTURE, BLOOD (ROUTINE X 2)
Culture: NO GROWTH
Culture: NO GROWTH
Special Requests: ADEQUATE
Special Requests: ADEQUATE

## 2021-08-29 DIAGNOSIS — D62 Acute posthemorrhagic anemia: Secondary | ICD-10-CM | POA: Diagnosis not present

## 2021-08-29 DIAGNOSIS — K254 Chronic or unspecified gastric ulcer with hemorrhage: Secondary | ICD-10-CM | POA: Diagnosis not present

## 2021-09-03 DIAGNOSIS — M25552 Pain in left hip: Secondary | ICD-10-CM | POA: Diagnosis not present

## 2021-09-03 DIAGNOSIS — M1612 Unilateral primary osteoarthritis, left hip: Secondary | ICD-10-CM | POA: Diagnosis not present

## 2021-09-03 DIAGNOSIS — Z96641 Presence of right artificial hip joint: Secondary | ICD-10-CM | POA: Diagnosis not present

## 2021-09-03 DIAGNOSIS — M545 Low back pain, unspecified: Secondary | ICD-10-CM | POA: Diagnosis not present

## 2021-09-23 ENCOUNTER — Other Ambulatory Visit: Payer: Self-pay | Admitting: Hematology & Oncology

## 2021-09-23 DIAGNOSIS — D473 Essential (hemorrhagic) thrombocythemia: Secondary | ICD-10-CM

## 2021-09-26 DIAGNOSIS — E119 Type 2 diabetes mellitus without complications: Secondary | ICD-10-CM | POA: Diagnosis not present

## 2021-09-26 DIAGNOSIS — I350 Nonrheumatic aortic (valve) stenosis: Secondary | ICD-10-CM | POA: Diagnosis not present

## 2021-09-26 DIAGNOSIS — I10 Essential (primary) hypertension: Secondary | ICD-10-CM | POA: Diagnosis not present

## 2021-09-26 DIAGNOSIS — Z0000 Encounter for general adult medical examination without abnormal findings: Secondary | ICD-10-CM | POA: Diagnosis not present

## 2021-09-26 DIAGNOSIS — D500 Iron deficiency anemia secondary to blood loss (chronic): Secondary | ICD-10-CM | POA: Diagnosis not present

## 2021-09-27 ENCOUNTER — Other Ambulatory Visit: Payer: Self-pay

## 2021-09-27 ENCOUNTER — Emergency Department (HOSPITAL_COMMUNITY)
Admission: EM | Admit: 2021-09-27 | Discharge: 2021-09-27 | Disposition: A | Discharge status: Home / Self Care | Payer: Medicare Other | Attending: Emergency Medicine | Admitting: Emergency Medicine

## 2021-09-27 ENCOUNTER — Encounter (HOSPITAL_COMMUNITY): Payer: Self-pay | Admitting: Emergency Medicine

## 2021-09-27 DIAGNOSIS — D72829 Elevated white blood cell count, unspecified: Secondary | ICD-10-CM | POA: Insufficient documentation

## 2021-09-27 DIAGNOSIS — Z7901 Long term (current) use of anticoagulants: Secondary | ICD-10-CM | POA: Diagnosis not present

## 2021-09-27 DIAGNOSIS — I4891 Unspecified atrial fibrillation: Secondary | ICD-10-CM | POA: Insufficient documentation

## 2021-09-27 DIAGNOSIS — D649 Anemia, unspecified: Secondary | ICD-10-CM | POA: Insufficient documentation

## 2021-09-27 LAB — CBC WITH DIFFERENTIAL/PLATELET
Abs Immature Granulocytes: 0.84 10*3/uL — ABNORMAL HIGH (ref 0.00–0.07)
Basophils Absolute: 0.4 10*3/uL — ABNORMAL HIGH (ref 0.0–0.1)
Basophils Relative: 2 %
Eosinophils Absolute: 0 10*3/uL (ref 0.0–0.5)
Eosinophils Relative: 0 %
HCT: 26.3 % — ABNORMAL LOW (ref 39.0–52.0)
Hemoglobin: 8.1 g/dL — ABNORMAL LOW (ref 13.0–17.0)
Immature Granulocytes: 3 %
Lymphocytes Relative: 4 %
Lymphs Abs: 1 10*3/uL (ref 0.7–4.0)
MCH: 34.6 pg — ABNORMAL HIGH (ref 26.0–34.0)
MCHC: 30.8 g/dL (ref 30.0–36.0)
MCV: 112.4 fL — ABNORMAL HIGH (ref 80.0–100.0)
Monocytes Absolute: 1.1 10*3/uL — ABNORMAL HIGH (ref 0.1–1.0)
Monocytes Relative: 4 %
Neutro Abs: 22.6 10*3/uL — ABNORMAL HIGH (ref 1.7–7.7)
Neutrophils Relative %: 87 %
Platelet Morphology: NORMAL
Platelets: 101 10*3/uL — ABNORMAL LOW (ref 150–400)
RBC: 2.34 MIL/uL — ABNORMAL LOW (ref 4.22–5.81)
RDW: 23.4 % — ABNORMAL HIGH (ref 11.5–15.5)
WBC: 25.9 10*3/uL — ABNORMAL HIGH (ref 4.0–10.5)
nRBC: 0.5 % — ABNORMAL HIGH (ref 0.0–0.2)

## 2021-09-27 LAB — COMPREHENSIVE METABOLIC PANEL
ALT: 12 U/L (ref 0–44)
AST: 22 U/L (ref 15–41)
Albumin: 3.2 g/dL — ABNORMAL LOW (ref 3.5–5.0)
Alkaline Phosphatase: 101 U/L (ref 38–126)
Anion gap: 10 (ref 5–15)
BUN: 19 mg/dL (ref 8–23)
CO2: 21 mmol/L — ABNORMAL LOW (ref 22–32)
Calcium: 8.7 mg/dL — ABNORMAL LOW (ref 8.9–10.3)
Chloride: 105 mmol/L (ref 98–111)
Creatinine, Ser: 1.31 mg/dL — ABNORMAL HIGH (ref 0.61–1.24)
GFR, Estimated: 60 mL/min — ABNORMAL LOW (ref >60)
Glucose, Bld: 99 mg/dL (ref 70–99)
Potassium: 4 mmol/L (ref 3.5–5.1)
Sodium: 136 mmol/L (ref 135–145)
Total Bilirubin: 1.3 mg/dL — ABNORMAL HIGH (ref 0.3–1.2)
Total Protein: 7.1 g/dL (ref 6.5–8.1)

## 2021-09-27 LAB — PREPARE RBC (CROSSMATCH)

## 2021-09-27 MED ORDER — SODIUM CHLORIDE 0.9 % IV SOLN: 10.0000 mL/h | Freq: Once | INTRAVENOUS | Status: DC

## 2021-09-27 NOTE — ED Provider Notes (Signed)
[facility] DEPT Provider Note   CSN: [id number] Arrival date & time: 09/27/21  1356     History  Chief Complaint  Patient presents with   Abnormal Labs    Justin [name] is a 66 y.o. male.  HPI Patient presents with his wife who assists with the history.  Additional details are obtained on chart review.  Patient's history is notable for essential thrombocythemia, aortic valve replacement, A-fib, aortic stenosis, and he is on Eliquis.  He has had frequent nosebleeds, as recently as yesterday, and has previously required transfusions.  No epistaxis today, however.  Yesterday the patient had blood drawn at his primary care physician's office, and with some consideration of anemia he was sent here for evaluation today.  He denies any melena, states that he has been having brown stool, has no vomiting, hematemesis, no lightheadedness, no syncope, no dyspnea.    Home Medications Prior to Admission medications   Medication Sig Start Date End Date Taking? Authorizing Provider  albuterol (VENTOLIN HFA) 108 (90 Base) MCG/ACT inhaler Inhale 1 puff into the lungs every 6 (six) hours as needed for shortness of breath. 11/11/19   [provider]  Carboxymethylcellulose Sodium (REFRESH TEARS OP) Place 1-2 drops into both eyes daily as needed (dry eyes).    [provider]  Cholecalciferol 125 MCG (5000 UT) capsule Take 5,000 Units by mouth daily.     [provider]  cyclobenzaprine (FLEXERIL) 10 MG tablet Take 10 mg by mouth 3 (three) times daily.     [provider]  docusate sodium (COLACE) 100 MG capsule Take 1 capsule (100 mg total) by mouth 2 (two) times daily. Patient taking differently: Take 100 mg by mouth daily as needed for mild constipation. 10/12/19   Meuth, Brooke A, PA-C  ELIQUIS 5 MG TABS tablet Take 1 tablet (5 mg total) by mouth 2 (two) times daily. 08/24/21   Ghimire, Henreitta Leber, MD  ferrous sulfate 325 (65 FE) MG  tablet Take 1 tablet (325 mg total) by mouth 2 (two) times daily with a meal. 08/13/18   Kayleen Memos, DO  furosemide (LASIX) 20 MG tablet Take 20 mg by mouth daily as needed for fluid. 04/17/18   [provider]  gabapentin (NEURONTIN) 300 MG capsule Take 300 mg by mouth 3 (three) times daily.    [provider]  HYDROcodone-acetaminophen (NORCO) 7.5-325 MG tablet Take 1-2 tablets by mouth every 4 (four) hours as needed for moderate pain.  03/28/19   [provider]  hydroxyurea (HYDREA) 500 MG capsule TAKE ONE CAPSULE BY MOUTH TWICE A DAY *MAY TAKE WITH FOOD TO MINIMIZE GI SIDE EFFECTS 09/23/21   Volanda Napoleon, MD  Lidocaine (BLUE-EMU PAIN RELIEF DRY EX) Apply 1 application topically daily as needed (pain).    [provider]  loratadine (CLARITIN) 10 MG tablet Take 10 mg by mouth daily.    [provider]  metoprolol succinate (TOPROL-XL) 25 MG 24 hr tablet Take 25 mg by mouth 2 (two) times daily.    [provider]  Multiple Vitamins-Minerals (CENTRUM ADULTS) TABS Take 1 capsule by mouth daily.    [provider]  pantoprazole (PROTONIX) 40 MG tablet Take 1 tablet (40 mg total) by mouth 2 (two) times daily. Twice daily x4 weeks-then daily. 08/22/21   Ghimire, Henreitta Leber, MD  potassium chloride (MICRO-K) 10 MEQ CR capsule Take 10 mEq by mouth daily as needed (take when taking lasix).    [provider]  pregabalin (LYRICA) 50 MG capsule Take 50 mg by mouth 3 (three) times daily. Patient is only taking one tablet daily    [provider]  rosuvastatin (CRESTOR) 20 MG tablet TAKE 1 TABLET BY MOUTH EVERY DAY IN THE EVENING Patient taking differently: Take 20 mg by mouth daily. 01/29/20   Buford Dresser, MD  senna-docusate (SENOKOT-S) 8.6-50 MG tablet Take 1 tablet by mouth 2 (two) times daily. Patient not taking: Reported on 08/18/2021 10/12/19   Margie Billet A, PA-C  sucralfate (CARAFATE) 1 g tablet Take 1 tablet (1 g  total) by mouth 2 (two) times daily. 08/22/21 09/21/21  Ghimire, Henreitta Leber, MD  tamsulosin (FLOMAX) 0.4 MG CAPS capsule Take 1 capsule (0.4 mg total) by mouth daily. 10/13/19   Meuth, Brooke A, PA-C  zolpidem (AMBIEN) 10 MG tablet Take 10 mg by mouth at bedtime.     [provider]      Allergies    Codeine, Dilaudid [hydromorphone], and Doxycycline    Review of Systems   Review of Systems  All other systems reviewed and are negative.   Physical Exam Updated Vital Signs BP 136/65   Pulse 97   Temp 98.3 F (36.8 C) (Oral)   Resp (!) 31   SpO2 93%  Physical Exam Vitals and nursing note reviewed.  Constitutional:      General: He is not in acute distress.    Appearance: He is well-developed.  HENT:     Head: Normocephalic and atraumatic.  Eyes:     Conjunctiva/sclera: Conjunctivae normal.  Cardiovascular:     Rate and Rhythm: Normal rate and regular rhythm.  Pulmonary:     Effort: Pulmonary effort is normal. No respiratory distress.     Breath sounds: No stridor.  Abdominal:     General: There is no distension.  Skin:    General: Skin is warm and dry.     Coloration: Skin is pale.  Neurological:     Mental Status: He is alert and oriented to person, place, and time.     ED Results / Procedures / Treatments   Labs (all labs ordered are listed, but only abnormal results are displayed) Labs Reviewed  COMPREHENSIVE METABOLIC PANEL - Abnormal; Notable for the following components:      Result Value   CO2 21 (*)    Creatinine, Ser 1.31 (*)    Calcium 8.7 (*)    Albumin 3.2 (*)    Total Bilirubin 1.3 (*)    GFR, Estimated 60 (*)    All other components within normal limits  CBC WITH DIFFERENTIAL/PLATELET - Abnormal; Notable for the following components:   WBC 25.9 (*)    RBC 2.34 (*)    Hemoglobin 8.1 (*)    HCT 26.3 (*)    MCV 112.4 (*)    MCH 34.6 (*)    RDW 23.4 (*)    Platelets 101 (*)    nRBC 0.5 (*)    Neutro Abs 22.6 (*)    Monocytes Absolute 1.1  (*)    Basophils Absolute 0.4 (*)    Abs Immature Granulocytes 0.84 (*)    All other components within normal limits  TYPE AND SCREEN  PREPARE RBC (CROSSMATCH)    EKG None  Radiology No results found.  Procedures Procedures    Medications Ordered in ED Medications  0.9 %  sodium chloride infusion (has no administration in time range)    ED Course/ Medical Decision Making/ A&P This patient  with a Hx of essential thrombocythemia, A-fib, Eliquis dependency presents to the ED for concern of epistaxis yesterday, pallor, anemia, this involves an extensive number of treatment options, and is a complaint that carries with it a high risk of complications and morbidity.    The differential diagnosis includes symptomatic anemia, ongoing blood loss   Social Determinants of Health:  No limiting factors  Additional history obtained:  Additional history and/or information obtained from wife, chart review, notable for wife details included in HPI, per chart review the patient has been working with oncology for his essential thrombocythemia, and GI due to prior blood loss, with endoscopy, multiple lesions identified, some of which were not amenable to intervention.  I discussed this case with gastroenterology.   After the initial evaluation, orders, including: Labs monitoring were initiated.   Patient placed on Cardiac and Pulse-Oximetry Monitors. The patient was maintained on a cardiac monitor.  The cardiac monitored showed an rhythm of 90 sinus normal The patient was also maintained on pulse oximetry. The readings were typically 100% room air normal   On repeat evaluation of the patient improved  Following transfusion the patient has better skin color, states that he is feeling better  Lab Tests:  I personally interpreted labs.  The pertinent results include: Anemia, hemoglobin 8.1, notable leukocytosis, consistent with prior, however.  Consultations Obtained:  I requested  consultation with the gastroenterology.  Dispostion / Final MDM:  After consideration of the diagnostic results and the patient's response to treatment, this patient was presenting with fatigue, in the context of known essential thrombocythemia, with ongoing Eliquis use, and concern for anemia, ongoing bleed.  Evaluation are somewhat reassuring, with vital signs that are stable, however, the patient is found to have anemia, and given concern for his symptoms, patient received blood transfusion after the lengthy conversation.  This was well-tolerated, the patient had improved energy, color, had no active bleeding throughout his ED course, was discharged to follow-up with his oncology team in 2 days.  Final Clinical Impression(s) / ED Diagnoses Final diagnoses:  Symptomatic anemia    CRITICAL CARE Performed by: Carmin Muskrat Total critical care time: 35 minutes Critical care time was exclusive of separately billable procedures and treating other patients. Critical care was necessary to treat or prevent imminent or life-threatening deterioration. Critical care was time spent personally by me on the following activities: development of treatment plan with patient and/or surrogate as well as nursing, discussions with consultants, evaluation of patient's response to treatment, examination of patient, obtaining history from patient or surrogate, ordering and performing treatments and interventions, ordering and review of laboratory studies, ordering and review of radiographic studies, pulse oximetry and re-evaluation of patient's condition.    Carmin Muskrat, MD 09/27/21 2233

## 2021-09-27 NOTE — Discharge Instructions (Signed)
As discussed, with your anemia, and need for Eliquis it is very important that you monitor your condition carefully and do not hesitate to return here.  Otherwise be sure to follow-up with your physicians on Monday via telephone.

## 2021-09-29 LAB — TYPE AND SCREEN
ABO/RH(D): A POS
Antibody Screen: NEGATIVE
Unit division: 0

## 2021-09-29 LAB — BPAM RBC
Blood Product Expiration Date: 202309242359
ISSUE DATE / TIME: 202309091934
Unit Type and Rh: 6200

## 2021-10-09 ENCOUNTER — Other Ambulatory Visit: Payer: Self-pay

## 2021-10-09 ENCOUNTER — Other Ambulatory Visit: Payer: Self-pay | Admitting: *Deleted

## 2021-10-09 ENCOUNTER — Inpatient Hospital Stay: Payer: Medicare Other | Attending: Hematology & Oncology

## 2021-10-09 ENCOUNTER — Encounter: Payer: Self-pay | Admitting: Hematology & Oncology

## 2021-10-09 ENCOUNTER — Inpatient Hospital Stay: Payer: Medicare Other | Admitting: Hematology & Oncology

## 2021-10-09 VITALS — BP 135/63 | HR 82 | Temp 97.6°F | Resp 18 | Ht 68.0 in | Wt 176.0 lb

## 2021-10-09 DIAGNOSIS — D649 Anemia, unspecified: Secondary | ICD-10-CM | POA: Diagnosis not present

## 2021-10-09 DIAGNOSIS — D721 Eosinophilia, unspecified: Secondary | ICD-10-CM

## 2021-10-09 DIAGNOSIS — D473 Essential (hemorrhagic) thrombocythemia: Secondary | ICD-10-CM | POA: Diagnosis not present

## 2021-10-09 DIAGNOSIS — D72829 Elevated white blood cell count, unspecified: Secondary | ICD-10-CM | POA: Insufficient documentation

## 2021-10-09 DIAGNOSIS — D751 Secondary polycythemia: Secondary | ICD-10-CM

## 2021-10-09 DIAGNOSIS — D75839 Thrombocytosis, unspecified: Secondary | ICD-10-CM | POA: Insufficient documentation

## 2021-10-09 LAB — CMP (CANCER CENTER ONLY)
ALT: 8 U/L (ref 0–44)
AST: 18 U/L (ref 15–41)
Albumin: 3.4 g/dL — ABNORMAL LOW (ref 3.5–5.0)
Alkaline Phosphatase: 109 U/L (ref 38–126)
Anion gap: 10 (ref 5–15)
BUN: 18 mg/dL (ref 8–23)
CO2: 24 mmol/L (ref 22–32)
Calcium: 9.3 mg/dL (ref 8.9–10.3)
Chloride: 105 mmol/L (ref 98–111)
Creatinine: 1.29 mg/dL — ABNORMAL HIGH (ref 0.61–1.24)
GFR, Estimated: >60 mL/min (ref >60)
Glucose, Bld: 108 mg/dL — ABNORMAL HIGH (ref 70–99)
Potassium: 4.3 mmol/L (ref 3.5–5.1)
Sodium: 139 mmol/L (ref 135–145)
Total Bilirubin: 0.9 mg/dL (ref 0.3–1.2)
Total Protein: 6.9 g/dL (ref 6.5–8.1)

## 2021-10-09 LAB — CBC WITH DIFFERENTIAL (CANCER CENTER ONLY)
Abs Immature Granulocytes: 1.64 10*3/uL — ABNORMAL HIGH (ref 0.00–0.07)
Basophils Absolute: 0.4 10*3/uL — ABNORMAL HIGH (ref 0.0–0.1)
Basophils Relative: 1 %
Eosinophils Absolute: 0.1 10*3/uL (ref 0.0–0.5)
Eosinophils Relative: 0 %
HCT: 28.4 % — ABNORMAL LOW (ref 39.0–52.0)
Hemoglobin: 8.9 g/dL — ABNORMAL LOW (ref 13.0–17.0)
Immature Granulocytes: 6 %
Lymphocytes Relative: 3 %
Lymphs Abs: 0.9 10*3/uL (ref 0.7–4.0)
MCH: 34.4 pg — ABNORMAL HIGH (ref 26.0–34.0)
MCHC: 31.3 g/dL (ref 30.0–36.0)
MCV: 109.7 fL — ABNORMAL HIGH (ref 80.0–100.0)
Monocytes Absolute: 1 10*3/uL (ref 0.1–1.0)
Monocytes Relative: 3 %
Neutro Abs: 25.3 10*3/uL — ABNORMAL HIGH (ref 1.7–7.7)
Neutrophils Relative %: 87 %
Platelet Count: 190 10*3/uL (ref 150–400)
RBC: 2.59 MIL/uL — ABNORMAL LOW (ref 4.22–5.81)
RDW: 22.2 % — ABNORMAL HIGH (ref 11.5–15.5)
WBC Count: 29.2 10*3/uL — ABNORMAL HIGH (ref 4.0–10.5)
nRBC: 0.4 % — ABNORMAL HIGH (ref 0.0–0.2)

## 2021-10-09 LAB — FERRITIN: Ferritin: 43 ng/mL (ref 24–336)

## 2021-10-09 LAB — LACTATE DEHYDROGENASE: LDH: 386 U/L — ABNORMAL HIGH (ref 98–192)

## 2021-10-09 LAB — SAVE SMEAR(SSMR), FOR PROVIDER SLIDE REVIEW

## 2021-10-09 NOTE — Progress Notes (Signed)
Hematology and Oncology Follow Up Visit  Justin [name] [id number] 11-08-1955 66 y.o. 10/09/2021   Principle Diagnosis:  Essential thrombocythemia, JAK2 V617 mutation+; high risk (age >54) - Previous patient of Dr. 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 daily on 10/09/2021 IV iron-Venofer given as needed Retacrit 40,000 units SQ weekly --start on 10/14/2021   Interim History:  Justin [name] is here today for follow-up.  He does look quite pale.  His hemoglobin is only 8.9.  I did do a rectal exam on him.  The stool was actually heme negative.  We will going to have to be aggressive with getting his hemoglobin back up.  He is due to have hip surgery on October 17.  We really need to get his blood count a lot higher in order for him to have surgery.   I think we are going to have to transfuse him today.  I will give him 2 units of blood.  We will have to see what his iron levels are.  I suspect that we probably also have to give him ESA.  Again, we really need to get his blood count up if he is going to have surgery.  There is been no obvious melena or bright red blood per rectum.  He is on the Hydrea twice a day.  I told him to decrease the Hydrea to once a day.    He has had no fever.  He has had some shortness of breath.  He does have some fatigue.  There is been no leg swelling.  Currently, I would say his performance status is probably ECOG 1.     Medications:  Allergies as of 10/09/2021        Reactions   Codeine Swelling   Tolerates hydrocodone, tramadol, and oxycodone    Dilaudid [hydromorphone] Anxiety   Hallucinations    Doxycycline Itching, Rash        Medication List        Accurate as of October 09, 2021  2:49 PM. If you have any questions, ask your nurse or doctor.          albuterol 108 (90 Base) MCG/ACT inhaler Commonly known as: VENTOLIN HFA Inhale 1 puff into the lungs every 6 (six) hours as needed for shortness of breath.   BLUE-EMU PAIN RELIEF DRY EX Apply 1 application topically daily as needed (pain).   Centrum Adults Tabs Take 1 capsule by mouth daily.   Cholecalciferol 125 MCG (5000 UT) capsule Take 5,000 Units by mouth daily.   cyclobenzaprine 10 MG tablet Commonly known as: FLEXERIL Take 10 mg by mouth 3 (three) times daily.   docusate sodium 100 MG capsule Commonly known as: COLACE Take 1 capsule (100 mg total) by mouth 2 (two) times daily. What changed:  when to take this reasons to take this   Eliquis 5 MG Tabs tablet Generic drug: apixaban Take 1 tablet (5 mg total) by mouth 2 (two) times daily.   ferrous sulfate 325 (65 FE) MG tablet Take 1 tablet (325 mg  total) by mouth 2 (two) times daily with a meal.   furosemide 20 MG tablet Commonly known as: LASIX Take 20 mg by mouth daily as needed for fluid.   gabapentin 300 MG capsule Commonly known as: NEURONTIN Take 300 mg by mouth 3 (three) times daily.   HYDROcodone-acetaminophen 7.5-325 MG tablet Commonly known as: NORCO Take 1-2 tablets by mouth every 4 (four) hours as needed for moderate pain.   hydroxyurea 500 MG capsule Commonly known as: HYDREA TAKE ONE CAPSULE BY MOUTH TWICE A DAY *MAY TAKE WITH FOOD TO MINIMIZE GI SIDE EFFECTS   loratadine 10 MG tablet Commonly known as: CLARITIN Take 10 mg by mouth daily.   metoprolol succinate 25 MG 24 hr tablet Commonly known as: TOPROL-XL Take 25 mg by mouth 2 (two) times daily.   pantoprazole 40 MG  tablet Commonly known as: PROTONIX Take 1 tablet (40 mg total) by mouth 2 (two) times daily. Twice daily x4 weeks-then daily.   potassium chloride 10 MEQ CR capsule Commonly known as: MICRO-K Take 10 mEq by mouth daily as needed (take when taking lasix).   pregabalin 50 MG capsule Commonly known as: LYRICA Take 50 mg by mouth 3 (three) times daily. Patient is only taking one tablet daily   REFRESH TEARS OP Place 1-2 drops into both eyes daily as needed (dry eyes).   rosuvastatin 20 MG tablet Commonly known as: CRESTOR TAKE 1 TABLET BY MOUTH EVERY DAY IN THE EVENING What changed:  how much to take how to take this when to take this   senna-docusate 8.6-50 MG tablet Commonly known as: Senokot-S Take 1 tablet by mouth 2 (two) times daily.   sucralfate 1 g tablet Commonly known as: CARAFATE Take 1 tablet (1 g total) by mouth 2 (two) times daily.   tamsulosin 0.4 MG Caps capsule Commonly known as: FLOMAX Take 1 capsule (0.4 mg total) by mouth daily.   zolpidem 10 MG tablet Commonly known as: AMBIEN Take 10 mg by mouth at bedtime.        Allergies:  Allergies  Allergen Reactions   Codeine Swelling    Tolerates hydrocodone, tramadol, and oxycodone    Dilaudid [Hydromorphone] Anxiety    Hallucinations    Doxycycline Itching and Rash    Past Medical History, Surgical history, Social history, and Family History were reviewed and updated.  Review of Systems: Review of Systems  Constitutional: Negative.   HENT: Negative.    Eyes: Negative.   Respiratory: Negative.    Cardiovascular: Negative.   Gastrointestinal: Negative.   Genitourinary: Negative.   Musculoskeletal:  Positive for back pain and joint pain.  Skin: Negative.   Neurological: Negative.   Endo/Heme/Allergies: Negative.   Psychiatric/Behavioral: Negative.       Physical Exam:  vitals were not taken for this visit.   Wt Readings from Last 3 Encounters:  08/22/21 172 lb 2.9 oz (78.1 kg)   08/18/21 171 lb (77.6 kg)  08/14/21 171 lb 6.4 oz (77.7 kg)    Physical Exam Vitals reviewed.  HENT:     Head: Normocephalic and atraumatic.  Eyes:     Pupils: Pupils are equal, round, and reactive to light.  Cardiovascular:     Rate and Rhythm: Normal rate and regular rhythm.     Heart sounds: Normal heart sounds.  Pulmonary:     Effort: Pulmonary effort is normal.     Breath sounds: Normal breath sounds.  Abdominal:     General: Bowel sounds are normal.  Palpations: Abdomen is soft.  Musculoskeletal:        General: No tenderness or deformity. Normal range of motion.     Cervical back: Normal range of motion.  Lymphadenopathy:     Cervical: No cervical adenopathy.  Skin:    General: Skin is warm and dry.     Findings: No erythema or rash.  Neurological:     Mental Status: He is alert and oriented to person, place, and time.  Psychiatric:        Behavior: Behavior normal.        Thought Content: Thought content normal.        Judgment: Judgment normal.      Lab Results  Component Value Date   WBC 29.2 (H) 10/09/2021   HGB 8.9 (L) 10/09/2021   HCT 28.4 (L) 10/09/2021   MCV 109.7 (H) 10/09/2021   PLT 190 10/09/2021   Lab Results  Component Value Date   FERRITIN 55 08/18/2021   IRON 34 (L) 08/18/2021   TIBC 343 08/18/2021   UIBC 309 08/18/2021   IRONPCTSAT 10 (L) 08/18/2021   Lab Results  Component Value Date   RETICCTPCT 6.1 (H) 08/19/2021   RBC 2.59 (L) 10/09/2021   No results found for: "KPAFRELGTCHN", "LAMBDASER", "KAPLAMBRATIO" No results found for: "IGGSERUM", "IGA", "IGMSERUM" No results found for: "TOTALPROTELP", "ALBUMINELP", "A1GS", "A2GS", "BETS", "BETA2SER", "GAMS", "MSPIKE", "SPEI"   Chemistry      Component Value Date/Time   NA 136 09/27/2021 1546   NA 134 08/14/2021 1329   K 4.0 09/27/2021 1546   CL 105 09/27/2021 1546   CO2 21 (L) 09/27/2021 1546   BUN 19 09/27/2021 1546   BUN 15 08/14/2021 1329   CREATININE 1.31 (H) 09/27/2021  1546   CREATININE 0.97 08/18/2021 1302      Component Value Date/Time   CALCIUM 8.7 (L) 09/27/2021 1546   ALKPHOS 101 09/27/2021 1546   AST 22 09/27/2021 1546   AST 22 08/18/2021 1302   ALT 12 09/27/2021 1546   ALT 10 08/18/2021 1302   BILITOT 1.3 (H) 09/27/2021 1546   BILITOT 1.1 08/18/2021 1302       Impression and Plan:  Mr. Puccinelli is a very pleasant 66 yo caucasian gentleman with essential thrombocythemia, JAK2 V617 mutation +, high risk (age > 67).   I looked at his blood smear the microscope.  He does have leukocytosis.  He has the predominance of hypersegmented polys.  I saw a couple of eosinophils and basophils.  There may have been 1 or 2 immature myeloid cells.  I do not see any blasts.  Platelets were adequate in number and size.  Platelets were well granulated.  His red cell showed no nucleated red cells.  Again, the anemia is the problem right now.  We are going to have to be aggressive with this.  Again we do have a deadline that we have to get him to so he can have his hip surgery.  We will see what the erythropoietin level is.  We will see what his iron levels are.  If his iron is on the low side, we will then plan to give him some IV iron.  I think we can just get his hemoglobin above 11, he will clearly feel a lot better.  I went ahead to follow-up with him.  I will follow-up with him in 2 weeks.  We may need to have him come in weekly for his Retacrit which I will use for his  lower erythropoietin level.   Volanda Napoleon, MD 9/21/20232:49 PM

## 2021-10-10 ENCOUNTER — Inpatient Hospital Stay: Payer: Medicare Other

## 2021-10-10 DIAGNOSIS — D649 Anemia, unspecified: Secondary | ICD-10-CM | POA: Diagnosis not present

## 2021-10-10 DIAGNOSIS — D473 Essential (hemorrhagic) thrombocythemia: Secondary | ICD-10-CM

## 2021-10-10 DIAGNOSIS — D75839 Thrombocytosis, unspecified: Secondary | ICD-10-CM | POA: Diagnosis not present

## 2021-10-10 DIAGNOSIS — D72829 Elevated white blood cell count, unspecified: Secondary | ICD-10-CM | POA: Diagnosis not present

## 2021-10-10 LAB — PREPARE RBC (CROSSMATCH)

## 2021-10-10 LAB — ERYTHROPOIETIN: Erythropoietin: 33.7 m[IU]/mL — ABNORMAL HIGH (ref 2.6–18.5)

## 2021-10-10 LAB — SAMPLE TO BLOOD BANK

## 2021-10-10 LAB — IRON AND IRON BINDING CAPACITY (CC-WL,HP ONLY)
Iron: 23 ug/dL — ABNORMAL LOW (ref 45–182)
Saturation Ratios: 8 % — ABNORMAL LOW (ref 17.9–39.5)
TIBC: 286 ug/dL (ref 250–450)
UIBC: 263 ug/dL (ref 117–376)

## 2021-10-10 MED ORDER — SODIUM CHLORIDE 0.9% IV SOLUTION
250.0000 mL | Freq: Once | INTRAVENOUS | Status: AC
  Administered 2021-10-10: 250 mL via INTRAVENOUS

## 2021-10-10 MED ORDER — FUROSEMIDE 10 MG/ML IJ SOLN: 20.0000 mg | Freq: Once | INTRAMUSCULAR | Status: DC

## 2021-10-10 MED ORDER — ACETAMINOPHEN 325 MG PO TABS
650.0000 mg | ORAL_TABLET | Freq: Once | ORAL | Status: AC
  Administered 2021-10-10: 650 mg via ORAL
  Filled 2021-10-10: qty 2

## 2021-10-10 MED ORDER — DIPHENHYDRAMINE HCL 25 MG PO CAPS
25.0000 mg | ORAL_CAPSULE | Freq: Once | ORAL | Status: AC
  Administered 2021-10-10: 25 mg via ORAL
  Filled 2021-10-10: qty 1

## 2021-10-10 NOTE — Patient Instructions (Signed)

## 2021-10-11 LAB — TYPE AND SCREEN
ABO/RH(D): A POS
Antibody Screen: NEGATIVE
Unit division: 0
Unit division: 0

## 2021-10-11 LAB — BPAM RBC
Blood Product Expiration Date: 202310082359
Blood Product Expiration Date: 202310122359
ISSUE DATE / TIME: 202309220733
ISSUE DATE / TIME: 202309220733
Unit Type and Rh: 6200
Unit Type and Rh: 6200

## 2021-10-13 ENCOUNTER — Telehealth: Payer: Self-pay | Admitting: *Deleted

## 2021-10-13 NOTE — Telephone Encounter (Signed)
Received inbasket message from Dr Marin Olp that patients iron is low needs 3 doses of Venofer 200 mg.  Spoke with Theone Murdoch [facility] who stated that patient should receive IV iron before her Retacrit.  3 iron doses scheduled per Dr Marin Olp request.

## 2021-10-14 ENCOUNTER — Inpatient Hospital Stay: Payer: Medicare Other

## 2021-10-14 VITALS — BP 120/66 | HR 78 | Temp 98.0°F | Resp 20

## 2021-10-14 DIAGNOSIS — D649 Anemia, unspecified: Secondary | ICD-10-CM

## 2021-10-14 DIAGNOSIS — D473 Essential (hemorrhagic) thrombocythemia: Secondary | ICD-10-CM | POA: Diagnosis not present

## 2021-10-14 DIAGNOSIS — D75839 Thrombocytosis, unspecified: Secondary | ICD-10-CM | POA: Diagnosis not present

## 2021-10-14 DIAGNOSIS — D72829 Elevated white blood cell count, unspecified: Secondary | ICD-10-CM | POA: Diagnosis not present

## 2021-10-14 MED ORDER — SODIUM CHLORIDE 0.9 % IV SOLN: Freq: Once | INTRAVENOUS | Status: AC

## 2021-10-14 MED ORDER — SODIUM CHLORIDE 0.9 % IV SOLN
200.0000 mg | Freq: Once | INTRAVENOUS | Status: AC
  Administered 2021-10-14: 200 mg via INTRAVENOUS
  Filled 2021-10-14: qty 200

## 2021-10-20 ENCOUNTER — Ambulatory Visit: Payer: Medicare Other | Admitting: Hematology & Oncology

## 2021-10-20 ENCOUNTER — Inpatient Hospital Stay: Payer: Medicare Other

## 2021-10-20 ENCOUNTER — Other Ambulatory Visit: Payer: Medicare Other

## 2021-10-22 ENCOUNTER — Ambulatory Visit: Payer: Medicare Other

## 2021-10-27 ENCOUNTER — Inpatient Hospital Stay: Payer: Medicare Other | Attending: Hematology & Oncology

## 2021-10-27 VITALS — BP 123/54 | HR 78 | Temp 97.6°F | Resp 17

## 2021-10-27 DIAGNOSIS — D473 Essential (hemorrhagic) thrombocythemia: Secondary | ICD-10-CM | POA: Insufficient documentation

## 2021-10-27 DIAGNOSIS — Z79899 Other long term (current) drug therapy: Secondary | ICD-10-CM | POA: Insufficient documentation

## 2021-10-27 DIAGNOSIS — D649 Anemia, unspecified: Secondary | ICD-10-CM | POA: Insufficient documentation

## 2021-10-27 MED ORDER — SODIUM CHLORIDE 0.9 % IV SOLN
200.0000 mg | Freq: Once | INTRAVENOUS | Status: AC
  Administered 2021-10-27: 200 mg via INTRAVENOUS
  Filled 2021-10-27: qty 200

## 2021-10-27 MED ORDER — EPOETIN ALFA-EPBX 40000 UNIT/ML IJ SOLN: 40000.0000 [IU] | Freq: Once | INTRAMUSCULAR | Status: DC

## 2021-10-27 MED ORDER — SODIUM CHLORIDE 0.9 % IV SOLN: Freq: Once | INTRAVENOUS | Status: AC

## 2021-10-27 NOTE — Progress Notes (Signed)
Pt declined to stay for post infusion observation period. Pt stated he has tolerated medication prior without difficulty. Pt aware to call clinic with any questions or concerns. Pt verbalized understanding and had no further questions.

## 2021-10-28 NOTE — Progress Notes (Addendum)
Anesthesia Review:  PCP: DR Sheryn Bison- 09/30/21 LOV 09/26/21- Clearance on chart  Cardiologist : DR Shawna Orleans christophger- 08/14/21 - LOV  Dr Tharon Aquas trigt- CABG x 4- 12/2017  Hematology- DR Oliver Pila- 10/13/21- clearance on chart  LOV 10/09/21  Vascular- Fortunato Curling- LOV 08/13/20  Chest x-ray : 08/18/21- 1 view  EKG : 08/19/21  Echo : 2020  Stress test: Cardiac Cath : 2019  Carotids- 08/13/20   Activity level: cannot do a flgiht of stairs without difficulty  Sleep Study/ CPAP : none Fasting Blood Sugar :      / Checks Blood Sugar -- times a day:   Blood Thinner/ Instructions /Last Dose: ASA / Instructions/ Last Dose :   Elliquis - pt has not been given preop instructions regarding eliquis.  Instructed pt to contact DR  Buford Dresser- who manages this.  Wife with pt at preop and both voice understanding.   Hydroxyurea- PT has not been given preop instructions regarding this.  Instructed pt to contact DR Martha Clan who manages this.  PT and wife voiced understanding.   81 mg Aspirin  Cbc and CMP- 10/09/21- epic  Labs repeated on 10/29/21.  Blood Transfusion - 10/23/21  Iron Infusion - 10/27/21  08/18/21- ED upper gi bleed  To have another iron infusioin on 11/03/2021 per pt and wife.   No longer a diabetic after 100 lb weight loss following OHS.  PT with hx of thrombocytic anemia and leukemia CBC  and BMP done 10/29/21 routed to Dr Alvan Dame. Janett Billow Ward, PAC made aware of above  on 10/29/21.   Hgba1c-10/29/21-5.1

## 2021-10-28 NOTE — Patient Instructions (Signed)
SURGICAL WAITING ROOM VISITATION Patients having surgery or a procedure may have no more than 2 support people in the waiting area - these visitors may rotate.   Children under the age of 33 must have an adult with them who is not the patient. If the patient needs to stay at the hospital during part of their recovery, the visitor guidelines for inpatient rooms apply. Pre-op nurse will coordinate an appropriate time for 1 support person to accompany patient in pre-op.  This support person may not rotate.    Please refer to the [facility] website for the visitor guidelines for Inpatients (after your surgery is over and you are in a regular room).       Your procedure is scheduled on:  11/04/2021    Report to [facility] Main Entrance    Report to admitting at   [place]   Call this number if you have problems the morning of surgery [phone]   Do not eat food :After Midnight.   After Midnight you may have the following liquids until  0845 AMDAY OF SURGERY  Water Non-Citrus Juices (without pulp, NO RED) Carbonated Beverages Black Coffee (NO MILK/CREAM OR CREAMERS, sugar ok)  Clear Tea (NO MILK/CREAM OR CREAMERS, sugar ok) regular and decaf                             Plain Jell-O (NO RED)                                           Fruit ices (not with fruit pulp, NO RED)                                     Popsicles (NO RED)                                                               Sports drinks like Gatorade (NO RED)                    The day of surgery:  Drink ONE (1) Pre-Surgery Clear Ensure or G2 at   0845AM  ( have completed by ) the morning of surgery. Drink in one sitting. Do not sip.  This drink was given to you during your hospital  pre-op appointment visit. Nothing else to drink after completing the  Pre-Surgery Clear Ensure or G2.          If you have questions, please contact your surgeon's office.     Oral Hygiene is also important to reduce your  risk of infection.                                    Remember - BRUSH YOUR TEETH THE MORNING OF SURGERY WITH YOUR REGULAR TOOTHPASTE   Do NOT smoke after Midnight   Take these medicines the morning of surgery with A SIP OF WATER:  inhalers as usual and bring, claritin, protonix  DO NOT TAKE ANY ORAL DIABETIC MEDICATIONS DAY OF YOUR SURGERY  Bring CPAP mask and tubing day of surgery.                              You may not have any metal on your body including hair pins, jewelry, and body piercing             Do not wear make-up, lotions, powders, perfumes/cologne, or deodorant  Do not wear nail polish including gel and S&S, artificial/acrylic nails, or any other type of covering on natural nails including finger and toenails. If you have artificial nails, gel coating, etc. that needs to be removed by a nail salon please have this removed prior to surgery or surgery may need to be canceled/ delayed if the surgeon/ anesthesia feels like they are unable to be safely monitored.   Do not shave  48 hours prior to surgery.               Men may shave face and neck.   Do not bring valuables to the hospital. [place].   Contacts, dentures or bridgework may not be worn into surgery.   Bring small overnight bag day of surgery.   DO NOT [place]. PHARMACY WILL DISPENSE MEDICATIONS LISTED ON YOUR MEDICATION LIST TO YOU DURING YOUR ADMISSION [place]!    Patients discharged on the day of surgery will not be allowed to drive home.  Someone NEEDS to stay with you for the first 24 hours after anesthesia.   Special Instructions: Bring a copy of your healthcare power of attorney and living will documents the day of surgery if you haven't scanned them before.              Please read over the following fact sheets you were given: IF [place]  [phone]   If you received a COVID test during your pre-op visit  it is requested that you wear a mask when out in public, stay away from anyone that may not be feeling well and notify your surgeon if you develop symptoms. If you test positive for Covid or have been in contact with anyone that has tested positive in the last 10 days please notify you surgeon.     [place] - Preparing for Surgery Before surgery, you can play an important role.  Because skin is not sterile, your skin needs to be as free of germs as possible.  You can reduce the number of germs on your skin by washing with CHG (chlorahexidine gluconate) soap before surgery.  CHG is an antiseptic cleaner which kills germs and bonds with the skin to continue killing germs even after washing. Please DO NOT use if you have an allergy to CHG or antibacterial soaps.  If your skin becomes reddened/irritated stop using the CHG and inform your nurse when you arrive at Short Stay. Do not shave (including legs and underarms) for at least 48 hours prior to the first CHG shower.  You may shave your face/neck. Please follow these instructions carefully:  1.  Shower with CHG Soap the night before surgery and the  morning of Surgery.  2.  If you choose to wash your hair, wash your hair first as usual  with your  normal  shampoo.  3.  After you shampoo, rinse your hair and body thoroughly to remove the  shampoo.                           4.  Use CHG as you would any other liquid soap.  You can apply chg directly  to the skin and wash                       Gently with a scrungie or clean washcloth.  5.  Apply the CHG Soap to your body ONLY FROM THE NECK DOWN.   Do not use on face/ open                           Wound or open sores. Avoid contact with eyes, ears mouth and genitals (private parts).                       Wash face,  Genitals (private parts) with your normal soap.             6.  Wash thoroughly, paying special attention to the area  where your surgery  will be performed.  7.  Thoroughly rinse your body with warm water from the neck down.  8.  DO NOT shower/wash with your normal soap after using and rinsing off  the CHG Soap.                9.  Pat yourself dry with a clean towel.            10.  Wear clean pajamas.            11.  Place clean sheets on your bed the night of your first shower and do not  sleep with pets. Day of Surgery : Do not apply any lotions/deodorants the morning of surgery.  Please wear clean clothes to the hospital/surgery center.  FAILURE TO FOLLOW THESE INSTRUCTIONS MAY RESULT IN THE CANCELLATION OF YOUR SURGERY PATIENT SIGNATURE_________________________________  NURSE SIGNATURE__________________________________  ________________________________________________________________________

## 2021-10-29 ENCOUNTER — Encounter (HOSPITAL_COMMUNITY): Payer: Self-pay

## 2021-10-29 ENCOUNTER — Encounter (HOSPITAL_COMMUNITY)
Admission: RE | Admit: 2021-10-29 | Discharge: 2021-10-29 | Disposition: A | Discharge status: Home / Self Care | Payer: Medicare Other | Source: Ambulatory Visit | Attending: Orthopedic Surgery | Admitting: Orthopedic Surgery

## 2021-10-29 ENCOUNTER — Other Ambulatory Visit: Payer: Self-pay

## 2021-10-29 VITALS — BP 138/69 | HR 79 | Temp 98.3°F | Resp 16 | Ht 66.0 in | Wt 170.0 lb

## 2021-10-29 DIAGNOSIS — M1612 Unilateral primary osteoarthritis, left hip: Secondary | ICD-10-CM | POA: Diagnosis not present

## 2021-10-29 DIAGNOSIS — Z01818 Encounter for other preprocedural examination: Secondary | ICD-10-CM | POA: Insufficient documentation

## 2021-10-29 HISTORY — DX: Dyspnea, unspecified: R06.00

## 2021-10-29 HISTORY — DX: Gastro-esophageal reflux disease without esophagitis: K21.9

## 2021-10-29 HISTORY — DX: Sleep apnea, unspecified: G47.30

## 2021-10-29 HISTORY — DX: Headache, unspecified: R51.9

## 2021-10-29 LAB — HEMOGLOBIN A1C
Hgb A1c MFr Bld: 5.1 % (ref 4.8–5.6)
Mean Plasma Glucose: 99.67 mg/dL

## 2021-10-29 LAB — CBC
HCT: 34.2 % — ABNORMAL LOW (ref 39.0–52.0)
Hemoglobin: 11 g/dL — ABNORMAL LOW (ref 13.0–17.0)
MCH: 35.3 pg — ABNORMAL HIGH (ref 26.0–34.0)
MCHC: 32.2 g/dL (ref 30.0–36.0)
MCV: 109.6 fL — ABNORMAL HIGH (ref 80.0–100.0)
Platelets: 169 10*3/uL (ref 150–400)
RBC: 3.12 MIL/uL — ABNORMAL LOW (ref 4.22–5.81)
RDW: 24.9 % — ABNORMAL HIGH (ref 11.5–15.5)
WBC: 17.4 10*3/uL — ABNORMAL HIGH (ref 4.0–10.5)
nRBC: 0.3 % — ABNORMAL HIGH (ref 0.0–0.2)

## 2021-10-29 LAB — BASIC METABOLIC PANEL
Anion gap: 8 (ref 5–15)
BUN: 17 mg/dL (ref 8–23)
CO2: 23 mmol/L (ref 22–32)
Calcium: 8.5 mg/dL — ABNORMAL LOW (ref 8.9–10.3)
Chloride: 107 mmol/L (ref 98–111)
Creatinine, Ser: 1.5 mg/dL — ABNORMAL HIGH (ref 0.61–1.24)
GFR, Estimated: 51 mL/min — ABNORMAL LOW (ref >60)
Glucose, Bld: 114 mg/dL — ABNORMAL HIGH (ref 70–99)
Potassium: 3.3 mmol/L — ABNORMAL LOW (ref 3.5–5.1)
Sodium: 138 mmol/L (ref 135–145)

## 2021-10-29 LAB — SURGICAL PCR SCREEN
MRSA, PCR: NEGATIVE
Staphylococcus aureus: POSITIVE — AB

## 2021-10-29 LAB — GLUCOSE, CAPILLARY: Glucose-Capillary: 115 mg/dL — ABNORMAL HIGH (ref 70–99)

## 2021-10-30 ENCOUNTER — Telehealth: Payer: Self-pay | Admitting: *Deleted

## 2021-10-30 NOTE — Progress Notes (Signed)
Anesthesia Chart Review   Case: [id number] Date/Time: 11/04/21 1130   Procedure: TOTAL HIP ARTHROPLASTY ANTERIOR APPROACH (Left: Hip)   Anesthesia type: Spinal   Pre-op diagnosis: Left hip osteoarthritis   Location: [facility]   Surgeons: Paralee Cancel, MD       DISCUSSION:66 y.o. 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 Paralee Cancel.   Pt followed by hematology.  Last seen 10/09/2021.  Hemoglobin 8.9 at this visit. Given 2 units of blood and iron infusion in preparation for surgery. Hemoglobin at PAT visit 11.0.   Pt last seen by cardiology 08/14/2021.   Pt was advised to hold Eliquis three days prior to surgery.  Reports last dose 11/01/2021.    Per cardiology preoperative evaluation 11/03/2021, "Chart reviewed as part of pre-operative protocol coverage. Patient was contacted 11/03/2021 in reference to pre-operative risk assessment for pending surgery as outlined below.  Brodi Kari Sider was last seen on 08/14/2021 by dr. Harrell Gave.  Since that day, Gaston Dase has done well from a cardiac standpoint.  He denies any new symptoms or concerns.  He is able to complete greater than 4 METS without difficulty.   Therefore, based on ACC/AHA guidelines, the patient would be at acceptable risk for the planned procedure without further cardiovascular testing. "  Anticipate pt can proceed with planned procedure barring acute status change.   VS: BP 138/69   Pulse 79   Temp 36.8 C (Oral)   Resp 16   Ht '5\' 6"'$  (1.676 m)   Wt 77.1 kg   SpO2 97%   BMI 27.44 kg/m   PROVIDERS: [name], MD is PCP   Primary Cardiologist: Buford Dresser, MD LABS: Labs reviewed: Acceptable for surgery. (all labs ordered are listed, but only abnormal results are displayed)  Labs Reviewed   SURGICAL PCR SCREEN - Abnormal; Notable for the following components:      Result Value   Staphylococcus aureus POSITIVE (*)    All other components within normal limits  BASIC METABOLIC PANEL - Abnormal; Notable for the following components:   Potassium 3.3 (*)    Glucose, Bld 114 (*)    Creatinine, Ser 1.50 (*)    Calcium 8.5 (*)    GFR, Estimated 51 (*)    All other components within normal limits  CBC - Abnormal; Notable for the following components:   WBC 17.4 (*)    RBC 3.12 (*)    Hemoglobin 11.0 (*)    HCT 34.2 (*)    MCV 109.6 (*)    MCH 35.3 (*)    RDW 24.9 (*)    nRBC 0.3 (*)    All other components within normal limits  GLUCOSE, CAPILLARY - Abnormal; Notable for the following components:   Glucose-Capillary 115 (*)    All other components within normal limits  HEMOGLOBIN A1C     IMAGES:   EKG:   CV: Echo 08/01/2018 1. The left ventricle has normal systolic function with an ejection  fraction of 60-65%. The cavity size was normal. There is mildly increased  left ventricular wall thickness. Left ventricular diastolic Doppler  parameters are indeterminate.   2. The right ventricle has normal systolic function.   3. Left atrial size was moderately dilated.   4. A 37m an EBig Lotsvalve is present in the aortic  position.  Procedure Date: 01/03/18.   5. Aortic valve prosthesis appears to open well.  Past Medical History:  Diagnosis Date   Acute meniscal tear of knee LEFT   Anemia    Aortic stenosis 12/01/2017   NONRHEUMATIC, AORTIC VALVE CALCIFICATIONS, MILD TO MODERATE REGURG, MILD TO MODERATE CALCIFIED ANNULUS per ECHO 10/25/17 @ [facility] [address]   Arthritis    Atrial fibrillation ([place]) 11/12/2017   AT O/V WITH PCP   Cancer ([place])    skin right arm   Coronary artery disease    Dyspnea    GERD (gastroesophageal reflux disease)    Headache    hx of migraines   Heart murmur MILD-- ASYMPTOMATIC   History of kidney stones    Hyperlipidemia     Hypertension    Left knee pain    PAD (peripheral artery disease) (H[facility])    left leg claudication   Sleep apnea     Past Surgical History:  Procedure Laterality Date   AORTIC VALVE REPLACEMENT N/A 01/03/2018   Procedure: AORTIC VALVE REPLACEMENT (AVR) using 45m Magna Ease Bioprosthesis Aortic Valve;  Surgeon: VIvin Poot MD;  [place]  Service: Open Heart Surgery;  Laterality: N/A;   APPENDECTOMY  1998   BIOPSY  08/11/2018   Procedure: BIOPSY;  Surgeon: NMauri Pole MD;  [place]  Service: Endoscopy;;   BIOPSY  08/20/2021   Procedure: BIOPSY;  Surgeon: MIrving Copas, MD;  [place]  Service: Gastroenterology;;   CATARACT EXTRACTION W/ INTRAOCULAR LENS  IMPLANT, BILATERAL  1998/  2000   CERVICAL FUSION  1985   C4 - 5   COLONOSCOPY N/A 08/21/2021   Procedure: COLONOSCOPY;  Surgeon: CDaryel November MD;  [place]  Service: Gastroenterology;  Laterality: N/A;   CORONARY ARTERY BYPASS GRAFT N/A 01/03/2018   Procedure: CORONARY ARTERY BYPASS GRAFTING (CABG) x 4 (LIMA to LAD, SVG to DIAGONAL, SVG to RAMUS INTERMEDIATE, and SVG to PDA), USING LEFT INFTERNAL MAMMARY ARTERY AND;  Surgeon: VPrescott Gum PCollier Salina MD;  [place]  Service: Open Heart Surgery;  Laterality: N/A;   ENDARTERECTOMY Right 01/03/2018   Procedure: ENDARTERECTOMY CAROTID;  Surgeon: CMarty Heck MD;  [facility]  Service: Vascular;  Laterality: Right;   ESOPHAGOGASTRODUODENOSCOPY (EGD) WITH PROPOFOL N/A 08/11/2018   Procedure: ESOPHAGOGASTRODUODENOSCOPY (EGD) WITH PROPOFOL;  Surgeon: NMauri Pole MD;  [place]  Service: Endoscopy;  Laterality: N/A;   ESOPHAGOGASTRODUODENOSCOPY (EGD) WITH PROPOFOL N/A 08/20/2021   Procedure: ESOPHAGOGASTRODUODENOSCOPY (EGD) WITH PROPOFOL;  Surgeon: MRush LandmarkGTelford Nab, MD;  [place]  Service: Gastroenterology;  Laterality: N/A;   HOT HEMOSTASIS N/A 08/20/2021    Procedure: HOT HEMOSTASIS (ARGON PLASMA COAGULATION/BICAP);  Surgeon: MIrving Copas, MD;  [place]  Service: Gastroenterology;  Laterality: N/A;   HOT HEMOSTASIS N/A 08/21/2021   Procedure: HOT HEMOSTASIS (ARGON PLASMA COAGULATION/BICAP);  Surgeon: CDaryel November MD;  [place]  Service: Gastroenterology;  Laterality: N/A;   KNEE ARTHROSCOPY W/ MENISCECTOMY  1991  1987   LEFT KNEE   x 2   NASAL SINUS SURGERY  1982   ORIF ANKLE FRACTURE Right 10/07/2019   Procedure: OPEN REDUCTION INTERNAL FIXATION (ORIF) ANKLE FRACTURE;  Surgeon: VHiram Gash MD;  [place]  Service: Orthopedics;  Laterality: Right;  Regional RIGHT  ankle block as well.   RIGHT/LEFT HEART CATH AND CORONARY ANGIOGRAPHY N/A 12/24/2017   Procedure: RIGHT/LEFT HEART CATH AND CORONARY ANGIOGRAPHY;  Surgeon: BHaroldine Laws  Shaune Pascal, MD;  Location: [place] [facility];  Service: Cardiovascular;  Laterality: N/A;   ROTATOR CUFF REPAIR  09-04-2005   LEFT SHOULDER   TEE WITHOUT CARDIOVERSION N/A 12/24/2017   Procedure: TRANSESOPHAGEAL ECHOCARDIOGRAM (TEE);  Surgeon: Jolaine Artist, MD;  Location: [facility];  Service: Cardiovascular;  Laterality: N/A;   TEE WITHOUT CARDIOVERSION N/A 01/03/2018   Procedure: TRANSESOPHAGEAL ECHOCARDIOGRAM (TEE);  Surgeon: Prescott Gum, Collier Salina, MD;  Location: [place];  Service: Open Heart Surgery;  Laterality: N/A;   TONSILLECTOMY  AGE 70   TOTAL HIP ARTHROPLASTY Right 12/23/2018   Procedure: TOTAL HIP ARTHROPLASTY ANTERIOR APPROACH;  Surgeon: Paralee Cancel, MD;  [facility];  Service: Orthopedics;  Laterality: Right;  70 mins    MEDICATIONS:  albuterol (VENTOLIN HFA) 108 (90 Base) MCG/ACT inhaler   aspirin EC 81 MG tablet   Carboxymethylcellulose Sodium (REFRESH TEARS OP)   Cholecalciferol 125 MCG (5000 UT) capsule   cyclobenzaprine (FLEXERIL) 10 MG tablet   docusate sodium (COLACE) 100 MG capsule   ELIQUIS 5 MG TABS tablet   ferrous sulfate 325 (65  FE) MG tablet   furosemide (LASIX) 20 MG tablet   HYDROcodone-acetaminophen (NORCO) 7.5-325 MG tablet   hydroxyurea (HYDREA) 500 MG capsule   Lidocaine (BLUE-EMU PAIN RELIEF DRY EX)   loratadine (CLARITIN) 10 MG tablet   metoprolol tartrate (LOPRESSOR) 25 MG tablet   Multiple Vitamins-Minerals (CENTRUM ADULTS) TABS   Omega-3 Fatty Acids (FISH OIL PO)   pantoprazole (PROTONIX) 40 MG tablet   Potassium Chloride ER 20 MEQ TBCR   rosuvastatin (CRESTOR) 20 MG tablet   senna-docusate (SENOKOT-S) 8.6-50 MG tablet   sucralfate (CARAFATE) 1 g tablet   tamsulosin (FLOMAX) 0.4 MG CAPS capsule   zolpidem (AMBIEN) 10 MG tablet   No current facility-administered medications for this encounter.     Konrad Felix Ward, PA-C [facility] [phone]

## 2021-10-30 NOTE — Telephone Encounter (Signed)
   Pre-operative Risk Assessment    Patient Name: Justin [name]  DOB: September 15, 1955 MRN: [id number]      Request for Surgical Clearance    Procedure:   LEFT TOTAL HIP ARTHROPLASTY  Date of Surgery:  Clearance 11/04/21                                 Surgeon:  DR. MATTHEW OLIN Surgeon's Group or Practice Name:  [facility] Phone number:  [phone] Fax number:  [phone] ATTN: Orson Slick   Type of Clearance Requested:   - Medical  - Pharmacy:  Hold Aspirin and Apixaban (Eliquis)     Type of Anesthesia:  Spinal   Additional requests/questions:    [name]   10/30/2021, 4:27 PM

## 2021-11-03 ENCOUNTER — Inpatient Hospital Stay: Payer: Medicare Other

## 2021-11-03 ENCOUNTER — Other Ambulatory Visit: Payer: Self-pay

## 2021-11-03 ENCOUNTER — Encounter: Payer: Self-pay | Admitting: Hematology & Oncology

## 2021-11-03 ENCOUNTER — Inpatient Hospital Stay: Payer: Medicare Other | Admitting: Hematology & Oncology

## 2021-11-03 ENCOUNTER — Ambulatory Visit: Payer: Medicare Other

## 2021-11-03 ENCOUNTER — Other Ambulatory Visit: Payer: Medicare Other

## 2021-11-03 VITALS — BP 147/85 | HR 102 | Temp 97.5°F | Resp 20 | Ht 66.0 in | Wt 167.0 lb

## 2021-11-03 DIAGNOSIS — J811 Chronic pulmonary edema: Secondary | ICD-10-CM | POA: Diagnosis not present

## 2021-11-03 DIAGNOSIS — E782 Mixed hyperlipidemia: Secondary | ICD-10-CM | POA: Diagnosis not present

## 2021-11-03 DIAGNOSIS — N179 Acute kidney failure, unspecified: Secondary | ICD-10-CM | POA: Diagnosis not present

## 2021-11-03 DIAGNOSIS — I4821 Permanent atrial fibrillation: Secondary | ICD-10-CM | POA: Diagnosis not present

## 2021-11-03 DIAGNOSIS — D72823 Leukemoid reaction: Secondary | ICD-10-CM | POA: Diagnosis not present

## 2021-11-03 DIAGNOSIS — Z87891 Personal history of nicotine dependence: Secondary | ICD-10-CM | POA: Diagnosis not present

## 2021-11-03 DIAGNOSIS — N189 Chronic kidney disease, unspecified: Secondary | ICD-10-CM | POA: Diagnosis present

## 2021-11-03 DIAGNOSIS — Z953 Presence of xenogenic heart valve: Secondary | ICD-10-CM | POA: Diagnosis not present

## 2021-11-03 DIAGNOSIS — D72829 Elevated white blood cell count, unspecified: Secondary | ICD-10-CM | POA: Diagnosis not present

## 2021-11-03 DIAGNOSIS — Z9889 Other specified postprocedural states: Secondary | ICD-10-CM | POA: Diagnosis not present

## 2021-11-03 DIAGNOSIS — I2510 Atherosclerotic heart disease of native coronary artery without angina pectoris: Secondary | ICD-10-CM | POA: Diagnosis not present

## 2021-11-03 DIAGNOSIS — D473 Essential (hemorrhagic) thrombocythemia: Secondary | ICD-10-CM

## 2021-11-03 DIAGNOSIS — R338 Other retention of urine: Secondary | ICD-10-CM | POA: Diagnosis not present

## 2021-11-03 DIAGNOSIS — K625 Hemorrhage of anus and rectum: Secondary | ICD-10-CM | POA: Diagnosis not present

## 2021-11-03 DIAGNOSIS — L03116 Cellulitis of left lower limb: Secondary | ICD-10-CM | POA: Diagnosis not present

## 2021-11-03 DIAGNOSIS — Z96642 Presence of left artificial hip joint: Secondary | ICD-10-CM | POA: Diagnosis not present

## 2021-11-03 DIAGNOSIS — G9341 Metabolic encephalopathy: Secondary | ICD-10-CM | POA: Diagnosis not present

## 2021-11-03 DIAGNOSIS — N138 Other obstructive and reflux uropathy: Secondary | ICD-10-CM | POA: Diagnosis present

## 2021-11-03 DIAGNOSIS — R339 Retention of urine, unspecified: Secondary | ICD-10-CM | POA: Diagnosis not present

## 2021-11-03 DIAGNOSIS — J90 Pleural effusion, not elsewhere classified: Secondary | ICD-10-CM | POA: Diagnosis not present

## 2021-11-03 DIAGNOSIS — I10 Essential (primary) hypertension: Secondary | ICD-10-CM | POA: Diagnosis not present

## 2021-11-03 DIAGNOSIS — R791 Abnormal coagulation profile: Secondary | ICD-10-CM | POA: Diagnosis not present

## 2021-11-03 DIAGNOSIS — I130 Hypertensive heart and chronic kidney disease with heart failure and stage 1 through stage 4 chronic kidney disease, or unspecified chronic kidney disease: Secondary | ICD-10-CM | POA: Diagnosis not present

## 2021-11-03 DIAGNOSIS — D649 Anemia, unspecified: Secondary | ICD-10-CM | POA: Diagnosis not present

## 2021-11-03 DIAGNOSIS — I5041 Acute combined systolic (congestive) and diastolic (congestive) heart failure: Secondary | ICD-10-CM | POA: Diagnosis not present

## 2021-11-03 DIAGNOSIS — E119 Type 2 diabetes mellitus without complications: Secondary | ICD-10-CM | POA: Diagnosis not present

## 2021-11-03 DIAGNOSIS — E440 Moderate protein-calorie malnutrition: Secondary | ICD-10-CM | POA: Diagnosis not present

## 2021-11-03 DIAGNOSIS — R4182 Altered mental status, unspecified: Secondary | ICD-10-CM | POA: Diagnosis not present

## 2021-11-03 DIAGNOSIS — Z471 Aftercare following joint replacement surgery: Secondary | ICD-10-CM | POA: Diagnosis not present

## 2021-11-03 DIAGNOSIS — I482 Chronic atrial fibrillation, unspecified: Secondary | ICD-10-CM | POA: Diagnosis not present

## 2021-11-03 DIAGNOSIS — N401 Enlarged prostate with lower urinary tract symptoms: Secondary | ICD-10-CM | POA: Diagnosis not present

## 2021-11-03 DIAGNOSIS — R7989 Other specified abnormal findings of blood chemistry: Secondary | ICD-10-CM | POA: Diagnosis not present

## 2021-11-03 DIAGNOSIS — T502X5A Adverse effect of carbonic-anhydrase inhibitors, benzothiadiazides and other diuretics, initial encounter: Secondary | ICD-10-CM | POA: Diagnosis not present

## 2021-11-03 DIAGNOSIS — D689 Coagulation defect, unspecified: Secondary | ICD-10-CM | POA: Diagnosis not present

## 2021-11-03 DIAGNOSIS — L89620 Pressure ulcer of left heel, unstageable: Secondary | ICD-10-CM | POA: Diagnosis present

## 2021-11-03 DIAGNOSIS — K760 Fatty (change of) liver, not elsewhere classified: Secondary | ICD-10-CM | POA: Diagnosis not present

## 2021-11-03 DIAGNOSIS — R5381 Other malaise: Secondary | ICD-10-CM | POA: Diagnosis not present

## 2021-11-03 DIAGNOSIS — Z66 Do not resuscitate: Secondary | ICD-10-CM | POA: Diagnosis not present

## 2021-11-03 DIAGNOSIS — G08 Intracranial and intraspinal phlebitis and thrombophlebitis: Secondary | ICD-10-CM | POA: Diagnosis not present

## 2021-11-03 DIAGNOSIS — E1169 Type 2 diabetes mellitus with other specified complication: Secondary | ICD-10-CM | POA: Diagnosis not present

## 2021-11-03 DIAGNOSIS — E1122 Type 2 diabetes mellitus with diabetic chronic kidney disease: Secondary | ICD-10-CM | POA: Diagnosis not present

## 2021-11-03 DIAGNOSIS — Y92239 Unspecified place in hospital as the place of occurrence of the external cause: Secondary | ICD-10-CM | POA: Diagnosis not present

## 2021-11-03 DIAGNOSIS — I5022 Chronic systolic (congestive) heart failure: Secondary | ICD-10-CM | POA: Diagnosis not present

## 2021-11-03 DIAGNOSIS — M1612 Unilateral primary osteoarthritis, left hip: Secondary | ICD-10-CM | POA: Diagnosis not present

## 2021-11-03 DIAGNOSIS — E1151 Type 2 diabetes mellitus with diabetic peripheral angiopathy without gangrene: Secondary | ICD-10-CM | POA: Diagnosis present

## 2021-11-03 DIAGNOSIS — M19072 Primary osteoarthritis, left ankle and foot: Secondary | ICD-10-CM | POA: Diagnosis not present

## 2021-11-03 DIAGNOSIS — F05 Delirium due to known physiological condition: Secondary | ICD-10-CM | POA: Diagnosis not present

## 2021-11-03 DIAGNOSIS — I480 Paroxysmal atrial fibrillation: Secondary | ICD-10-CM | POA: Diagnosis present

## 2021-11-03 DIAGNOSIS — M7989 Other specified soft tissue disorders: Secondary | ICD-10-CM | POA: Diagnosis not present

## 2021-11-03 DIAGNOSIS — D62 Acute posthemorrhagic anemia: Secondary | ICD-10-CM | POA: Diagnosis not present

## 2021-11-03 DIAGNOSIS — I5023 Acute on chronic systolic (congestive) heart failure: Secondary | ICD-10-CM | POA: Diagnosis not present

## 2021-11-03 DIAGNOSIS — E876 Hypokalemia: Secondary | ICD-10-CM | POA: Diagnosis not present

## 2021-11-03 DIAGNOSIS — R040 Epistaxis: Secondary | ICD-10-CM | POA: Diagnosis not present

## 2021-11-03 DIAGNOSIS — Z951 Presence of aortocoronary bypass graft: Secondary | ICD-10-CM | POA: Diagnosis not present

## 2021-11-03 DIAGNOSIS — D471 Chronic myeloproliferative disease: Secondary | ICD-10-CM | POA: Diagnosis not present

## 2021-11-03 LAB — CMP (CANCER CENTER ONLY)
ALT: 9 U/L (ref 0–44)
AST: 18 U/L (ref 15–41)
Albumin: 3.6 g/dL (ref 3.5–5.0)
Alkaline Phosphatase: 102 U/L (ref 38–126)
Anion gap: 11 (ref 5–15)
BUN: 14 mg/dL (ref 8–23)
CO2: 25 mmol/L (ref 22–32)
Calcium: 9.8 mg/dL (ref 8.9–10.3)
Chloride: 108 mmol/L (ref 98–111)
Creatinine: 1.27 mg/dL — ABNORMAL HIGH (ref 0.61–1.24)
GFR, Estimated: >60 mL/min (ref >60)
Glucose, Bld: 126 mg/dL — ABNORMAL HIGH (ref 70–99)
Potassium: 4 mmol/L (ref 3.5–5.1)
Sodium: 144 mmol/L (ref 135–145)
Total Bilirubin: 1.6 mg/dL — ABNORMAL HIGH (ref 0.3–1.2)
Total Protein: 7.3 g/dL (ref 6.5–8.1)

## 2021-11-03 LAB — LACTATE DEHYDROGENASE: LDH: 334 U/L — ABNORMAL HIGH (ref 98–192)

## 2021-11-03 LAB — CBC WITH DIFFERENTIAL (CANCER CENTER ONLY)
Abs Immature Granulocytes: 0 10*3/uL (ref 0.00–0.07)
Basophils Absolute: 0.4 10*3/uL — ABNORMAL HIGH (ref 0.0–0.1)
Basophils Relative: 2 %
Eosinophils Absolute: 0 10*3/uL (ref 0.0–0.5)
Eosinophils Relative: 0 %
HCT: 35.8 % — ABNORMAL LOW (ref 39.0–52.0)
Hemoglobin: 11.8 g/dL — ABNORMAL LOW (ref 13.0–17.0)
Lymphocytes Relative: 8 %
Lymphs Abs: 1.8 10*3/uL (ref 0.7–4.0)
MCH: 36.3 pg — ABNORMAL HIGH (ref 26.0–34.0)
MCHC: 33 g/dL (ref 30.0–36.0)
MCV: 110.2 fL — ABNORMAL HIGH (ref 80.0–100.0)
Monocytes Absolute: 1.1 10*3/uL — ABNORMAL HIGH (ref 0.1–1.0)
Monocytes Relative: 5 %
Neutro Abs: 19 10*3/uL — ABNORMAL HIGH (ref 1.7–7.7)
Neutrophils Relative %: 85 %
Platelet Count: 203 10*3/uL (ref 150–400)
RBC: 3.25 MIL/uL — ABNORMAL LOW (ref 4.22–5.81)
RDW: 25.2 % — ABNORMAL HIGH (ref 11.5–15.5)
WBC Count: 22.3 10*3/uL — ABNORMAL HIGH (ref 4.0–10.5)
nRBC: 0.6 % — ABNORMAL HIGH (ref 0.0–0.2)

## 2021-11-03 LAB — IRON AND IRON BINDING CAPACITY (CC-WL,HP ONLY)
Iron: 35 ug/dL — ABNORMAL LOW (ref 45–182)
Saturation Ratios: 15 % — ABNORMAL LOW (ref 17.9–39.5)
TIBC: 242 ug/dL — ABNORMAL LOW (ref 250–450)
UIBC: 207 ug/dL (ref 117–376)

## 2021-11-03 LAB — RETICULOCYTES
Immature Retic Fract: 36 % — ABNORMAL HIGH (ref 2.3–15.9)
RBC.: 3.28 MIL/uL — ABNORMAL LOW (ref 4.22–5.81)
Retic Count, Absolute: 74.8 10*3/uL (ref 19.0–186.0)
Retic Ct Pct: 2.3 % (ref 0.4–3.1)

## 2021-11-03 LAB — FERRITIN: Ferritin: 110 ng/mL (ref 24–336)

## 2021-11-03 LAB — SAVE SMEAR(SSMR), FOR PROVIDER SLIDE REVIEW

## 2021-11-03 NOTE — Telephone Encounter (Signed)
   Name: Justin [name]  DOB: [birth year]  MRN: [id number]   Primary Cardiologist: Buford Dresser, MD  Chart reviewed as part of pre-operative protocol coverage. Patient was contacted 11/03/2021 in reference to pre-operative risk assessment for pending surgery as outlined below.  Justin [name] was last seen on 08/14/2021 by dr. Harrell Gave.  Since that day, Justin [name] has done well from a cardiac standpoint.  He denies any new symptoms or concerns.  He is able to complete greater than 4 METS without difficulty.  Therefore, based on ACC/AHA guidelines, the patient would be at acceptable risk for the planned procedure without further cardiovascular testing.   The patient was advised that if he develops new symptoms prior to surgery to contact our office to arrange for a follow-up visit, and he verbalized understanding.  Per office protocol, patient may hold aspirin for 5 to 7 days prior to procedure.  Additionally, he may hold Eliquis for 3 days prior to procedure.  Please resume aspirin and Eliquis as soon as possible postprocedure, at the discretion of the surgeon.  I have confirmed that patient has been holding both aspirin and Eliquis.  I will route this recommendation to the requesting party via Epic fax function and remove from pre-op pool. Please call with questions.  Lenna Sciara, NP 11/03/2021, 2:40 PM

## 2021-11-03 NOTE — Telephone Encounter (Signed)
Patient with diagnosis of afib on Eliquis for anticoagulation.    Procedure: LEFT TOTAL HIP ARTHROPLASTY Date of procedure: 11/04/21   CHA2DS2-VASc Score = 4   This indicates a 4.8% annual risk of stroke. The patient's score is based upon: CHF History: 0 HTN History: 1 Diabetes History: 1 Stroke History: 0 Vascular Disease History: 1 Age Score: 1 Gender Score: 0      CrCl 38 ml/min Platelet count 169  Per office protocol, patient can hold Eliquis for 3 days prior to procedure.    Given the fact that patients procedure is tomorrow, please confirm he has been holding since Sat. If he has not, then procedure will need to be rescheduled.  **This guidance is not considered finalized until pre-operative APP has relayed final recommendations.**

## 2021-11-03 NOTE — Progress Notes (Signed)
Hematology and Oncology Follow Up Visit  Justin [name] [id number] [birth year] 66 y.o. 11/03/2021   Principle Diagnosis:  Essential thrombocythemia, JAK2 V617 mutation+; high risk (age >17) - Previous patient of Dr. 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 daily on 10/09/2021 IV iron-Venofer given on 10/27/2021  Retacrit 40,000 units SQ weekly --start on 10/14/2021   Interim History:  Justin [name] is here today for follow-up.  As expected, he responded incredibly well to the Retacrit.  I thought that he would as his erythropoietin level is only 34.  He is ready for his hip surgery.  This will be done tomorrow at [facility].  He will be an overnight.  He has had no complaints.  Again, he is responded well to the Retacrit.  We also had to give him some Venofer.  His iron studies were quite low.  His iron saturation was only 8%.  His ferritin was only 43.  He feels better.  He has a little more energy.  We also decrease his Hydrea dose.  I thought we had to do that given his anemia.  His white cell count is up a little bit.  I looked at his blood smear did not see anything that looks suspicious.  I do not see any problems with him having surgery from a hematologic point of view.  I would think they should be able to coagulate pretty well and not have problems with bleeding or infection.  Currently, I would say his performance status is probably ECOG 1.   Medications:  Allergies as of 11/03/2021        Reactions   Codeine Swelling, Other (See Comments)   Tolerates hydrocodone, tramadol, and oxycodone    Dilaudid [hydromorphone] Anxiety   Hallucinations    Doxycycline Itching, Rash        Medication List        Accurate as of November 03, 2021 11:06 AM. If you have any questions, ask your nurse or doctor.          albuterol 108 (90 Base) MCG/ACT inhaler Commonly known as: VENTOLIN HFA Inhale 1 puff into the lungs every 6 (six) hours as needed for shortness of breath.   aspirin EC 81 MG tablet Take 81 mg by mouth daily. Swallow whole.   BLUE-EMU PAIN RELIEF DRY EX Apply 1 application topically daily as needed (pain).   Centrum Adults Tabs Take 1 capsule by mouth daily.   Cholecalciferol 125 MCG (5000 UT) capsule Take 5,000 Units by mouth daily.   cyclobenzaprine 10 MG tablet Commonly known as: FLEXERIL Take 10 mg by mouth in the morning and at bedtime.   docusate sodium 100 MG capsule Commonly known as: COLACE Take 1 capsule (100 mg total) by mouth 2 (two) times daily.   Eliquis 5 MG Tabs tablet Generic drug: apixaban Take 1 tablet (5 mg total) by mouth 2 (two) times daily.   ferrous sulfate 325 (65 FE) MG tablet Take 1 tablet (325 mg total) by mouth 2 (two) times daily with a meal.  FISH OIL PO Take 1 capsule by mouth daily.   furosemide 20 MG tablet Commonly known as: LASIX Take 20 mg by mouth daily as needed for fluid.   HYDROcodone-acetaminophen 7.5-325 MG tablet Commonly known as: NORCO Take 1-2 tablets by mouth every 4 (four) hours as needed for moderate pain.   hydroxyurea 500 MG capsule Commonly known as: HYDREA TAKE ONE CAPSULE BY MOUTH TWICE A DAY *MAY TAKE WITH FOOD TO MINIMIZE GI SIDE EFFECTS   loratadine 10 MG tablet Commonly known as: CLARITIN Take 10 mg by mouth daily.   metoprolol tartrate 25 MG tablet Commonly known as: LOPRESSOR Take 25 mg by mouth 2 (two) times daily.   pantoprazole 40 MG tablet Commonly known as:  PROTONIX Take 1 tablet (40 mg total) by mouth 2 (two) times daily. Twice daily x4 weeks-then daily.   Potassium Chloride ER 20 MEQ Tbcr Take 20 mEq by mouth daily as needed (take when taking lasix).   REFRESH TEARS OP Place 1-2 drops into both eyes daily as needed (dry eyes).   rosuvastatin 20 MG tablet Commonly known as: CRESTOR TAKE 1 TABLET BY MOUTH EVERY DAY IN THE EVENING What changed:  how much to take how to take this when to take this   senna-docusate 8.6-50 MG tablet Commonly known as: Senokot-S Take 1 tablet by mouth 2 (two) times daily.   sucralfate 1 g tablet Commonly known as: CARAFATE Take 1 tablet (1 g total) by mouth 2 (two) times daily.   tamsulosin 0.4 MG Caps capsule Commonly known as: FLOMAX Take 1 capsule (0.4 mg total) by mouth daily.   zolpidem 10 MG tablet Commonly known as: AMBIEN Take 10 mg by mouth at bedtime.        Allergies:  Allergies  Allergen Reactions   Codeine Swelling and Other (See Comments)    Tolerates hydrocodone, tramadol, and oxycodone    Dilaudid [Hydromorphone] Anxiety    Hallucinations    Doxycycline Itching and Rash    Past Medical History, Surgical history, Social history, and Family History were reviewed and updated.  Review of Systems: Review of Systems  Constitutional: Negative.   HENT: Negative.    Eyes: Negative.   Respiratory: Negative.    Cardiovascular: Negative.   Gastrointestinal: Negative.   Genitourinary: Negative.   Musculoskeletal:  Positive for back pain and joint pain.  Skin: Negative.   Neurological: Negative.   Endo/Heme/Allergies: Negative.   Psychiatric/Behavioral: Negative.       Physical Exam:  height is '5\' 6"'$  (1.676 m) and weight is 167 lb (75.8 kg). His oral temperature is 97.5 F (36.4 C) (abnormal). His blood pressure is 147/85 (abnormal) and his pulse is 102 (abnormal). His respiration is 20 and oxygen saturation is 94%.   Wt Readings from Last 3 Encounters:  11/03/21 167  lb (75.8 kg)  10/29/21 170 lb (77.1 kg)  10/09/21 176 lb (79.8 kg)    Physical Exam Vitals reviewed.  HENT:     Head: Normocephalic and atraumatic.  Eyes:     Pupils: Pupils are equal, round, and reactive to light.  Cardiovascular:     Rate and Rhythm: Normal rate and regular rhythm.     Heart sounds: Normal heart sounds.  Pulmonary:     Effort: Pulmonary effort is normal.     Breath sounds: Normal breath sounds.  Abdominal:     General: Bowel sounds are normal.     Palpations: Abdomen is soft.  Musculoskeletal:  General: No tenderness or deformity. Normal range of motion.     Cervical back: Normal range of motion.  Lymphadenopathy:     Cervical: No cervical adenopathy.  Skin:    General: Skin is warm and dry.     Findings: No erythema or rash.  Neurological:     Mental Status: He is alert and oriented to person, place, and time.  Psychiatric:        Behavior: Behavior normal.        Thought Content: Thought content normal.        Judgment: Judgment normal.      Lab Results  Component Value Date   WBC 22.3 (H) 11/03/2021   HGB 11.8 (L) 11/03/2021   HCT 35.8 (L) 11/03/2021   MCV 110.2 (H) 11/03/2021   PLT 203 11/03/2021   Lab Results  Component Value Date   FERRITIN 43 10/09/2021   IRON 23 (L) 10/09/2021   TIBC 286 10/09/2021   UIBC 263 10/09/2021   IRONPCTSAT 8 (L) 10/09/2021   Lab Results  Component Value Date   RETICCTPCT 2.3 11/03/2021   RBC 3.28 (L) 11/03/2021   No results found for: "KPAFRELGTCHN", "LAMBDASER", "KAPLAMBRATIO" No results found for: "IGGSERUM", "IGA", "IGMSERUM" No results found for: "TOTALPROTELP", "ALBUMINELP", "A1GS", "A2GS", "BETS", "BETA2SER", "GAMS", "MSPIKE", "SPEI"   Chemistry      Component Value Date/Time   NA 144 11/03/2021 1000   NA 134 08/14/2021 1329   K 4.0 11/03/2021 1000   CL 108 11/03/2021 1000   CO2 25 11/03/2021 1000   BUN 14 11/03/2021 1000   BUN 15 08/14/2021 1329   CREATININE 1.27 (H) 11/03/2021  1000      Component Value Date/Time   CALCIUM 9.8 11/03/2021 1000   ALKPHOS 102 11/03/2021 1000   AST 18 11/03/2021 1000   ALT 9 11/03/2021 1000   BILITOT 1.6 (H) 11/03/2021 1000       Impression and Plan:  Justin [name] is a very pleasant 66 yo caucasian gentleman with essential thrombocythemia, JAK2 V617 mutation +, high risk (age > 7).   Our goal with the Retacrit was to get him to surgery.  We got his hemoglobin above 11.  Is 11.8 today.  As such, I think he should have the his left hip surgery without a lot of complications.  I would like to see him back in about a month.  I want to make sure that when he comes back, he is able to get back after his surgery and had not have a lot of difficulty.  We will be interesting to see what his blood counts are after his surgery.  It sounds like he will have the somewhat minimally invasive procedure so that there would not be a lot of blood loss.     Volanda Napoleon, MD 10/16/202311:06 AM

## 2021-11-03 NOTE — Anesthesia Preprocedure Evaluation (Addendum)
Anesthesia Evaluation  Patient identified by MRN, date of birth, ID band Patient awake    Reviewed: Allergy & Precautions, NPO status , Patient's Chart, lab work & pertinent test results, reviewed documented beta blocker date and time   History of Anesthesia Complications Negative for: history of anesthetic complications  Airway Mallampati: II  TM Distance: >3 FB Neck ROM: Full    Dental  (+) Edentulous Lower, Edentulous Upper   Pulmonary sleep apnea , former smoker,    Pulmonary exam normal        Cardiovascular hypertension, Pt. on home beta blockers and Pt. on medications + CAD and + CABG  Normal cardiovascular exam+ dysrhythmias (onn Eliquis) Atrial Fibrillation   Echo 08/01/2018: EF 60-65%, mild LVH, moderate LAE, s/p AVR 2019   S/P right CEA   Neuro/Psych negative neurological ROS     GI/Hepatic Neg liver ROS, GERD  Medicated and Controlled,  Endo/Other  diabetes, Type 2  Renal/GU Renal InsufficiencyRenal disease (Cr 1.27)  negative genitourinary   Musculoskeletal  (+) Arthritis , Osteoarthritis,    Abdominal   Peds  Hematology  (+) Blood dyscrasia (Hgb 11.8), anemia ,   Anesthesia Other Findings Day of surgery medications reviewed with patient.  Reproductive/Obstetrics negative OB ROS                          Anesthesia Physical Anesthesia Plan  ASA: 3  Anesthesia Plan: Spinal   Post-op Pain Management: Tylenol PO (pre-op)*   Induction:   PONV Risk Score and Plan: 1 and Treatment may vary due to age or medical condition, Dexamethasone and Ondansetron  Airway Management Planned: Natural Airway and Simple Face Mask  Additional Equipment: None  Intra-op Plan:   Post-operative Plan:   Informed Consent: I have reviewed the patients History and Physical, chart, labs and discussed the procedure including the risks, benefits and alternatives for the proposed anesthesia with the  patient or authorized representative who has indicated his/her understanding and acceptance.       Plan Discussed with: CRNA  Anesthesia Plan Comments: (See PAT note 10/29/2021)      Anesthesia Quick Evaluation

## 2021-11-04 ENCOUNTER — Encounter (HOSPITAL_COMMUNITY)
Admission: RE | Disposition: A | Discharge status: Rehabilitation Facility | Payer: Self-pay | Source: Home / Self Care | Attending: Orthopedic Surgery

## 2021-11-04 ENCOUNTER — Inpatient Hospital Stay (HOSPITAL_COMMUNITY)
Admission: RE | Admit: 2021-11-04 | Discharge: 2021-11-19 | DRG: 469 | Disposition: A | Discharge status: Rehabilitation Facility | Payer: Medicare Other | Attending: Orthopedic Surgery | Admitting: Orthopedic Surgery

## 2021-11-04 ENCOUNTER — Other Ambulatory Visit: Payer: Self-pay

## 2021-11-04 ENCOUNTER — Encounter (HOSPITAL_COMMUNITY): Payer: Self-pay | Admitting: Orthopedic Surgery

## 2021-11-04 ENCOUNTER — Ambulatory Visit (HOSPITAL_COMMUNITY): Payer: Medicare Other | Admitting: Physician Assistant

## 2021-11-04 ENCOUNTER — Ambulatory Visit (HOSPITAL_COMMUNITY): Payer: Medicare Other

## 2021-11-04 ENCOUNTER — Ambulatory Visit (HOSPITAL_COMMUNITY): Payer: Medicare Other | Admitting: Certified Registered Nurse Anesthetist

## 2021-11-04 ENCOUNTER — Observation Stay (HOSPITAL_COMMUNITY): Payer: Medicare Other

## 2021-11-04 DIAGNOSIS — D62 Acute posthemorrhagic anemia: Secondary | ICD-10-CM | POA: Diagnosis not present

## 2021-11-04 DIAGNOSIS — M1612 Unilateral primary osteoarthritis, left hip: Secondary | ICD-10-CM

## 2021-11-04 DIAGNOSIS — Z87891 Personal history of nicotine dependence: Secondary | ICD-10-CM

## 2021-11-04 DIAGNOSIS — I2510 Atherosclerotic heart disease of native coronary artery without angina pectoris: Secondary | ICD-10-CM | POA: Diagnosis not present

## 2021-11-04 DIAGNOSIS — M25752 Osteophyte, left hip: Secondary | ICD-10-CM | POA: Diagnosis present

## 2021-11-04 DIAGNOSIS — Z8711 Personal history of peptic ulcer disease: Secondary | ICD-10-CM

## 2021-11-04 DIAGNOSIS — E869 Volume depletion, unspecified: Secondary | ICD-10-CM | POA: Diagnosis not present

## 2021-11-04 DIAGNOSIS — I482 Chronic atrial fibrillation, unspecified: Secondary | ICD-10-CM | POA: Diagnosis present

## 2021-11-04 DIAGNOSIS — E1151 Type 2 diabetes mellitus with diabetic peripheral angiopathy without gangrene: Secondary | ICD-10-CM | POA: Diagnosis present

## 2021-11-04 DIAGNOSIS — R0902 Hypoxemia: Secondary | ICD-10-CM | POA: Diagnosis not present

## 2021-11-04 DIAGNOSIS — G4733 Obstructive sleep apnea (adult) (pediatric): Secondary | ICD-10-CM | POA: Diagnosis present

## 2021-11-04 DIAGNOSIS — Z7901 Long term (current) use of anticoagulants: Secondary | ICD-10-CM

## 2021-11-04 DIAGNOSIS — K219 Gastro-esophageal reflux disease without esophagitis: Secondary | ICD-10-CM | POA: Diagnosis present

## 2021-11-04 DIAGNOSIS — E782 Mixed hyperlipidemia: Secondary | ICD-10-CM | POA: Diagnosis present

## 2021-11-04 DIAGNOSIS — D689 Coagulation defect, unspecified: Secondary | ICD-10-CM | POA: Diagnosis not present

## 2021-11-04 DIAGNOSIS — G9341 Metabolic encephalopathy: Secondary | ICD-10-CM | POA: Diagnosis not present

## 2021-11-04 DIAGNOSIS — Z7982 Long term (current) use of aspirin: Secondary | ICD-10-CM

## 2021-11-04 DIAGNOSIS — R7989 Other specified abnormal findings of blood chemistry: Secondary | ICD-10-CM | POA: Diagnosis present

## 2021-11-04 DIAGNOSIS — Z96641 Presence of right artificial hip joint: Secondary | ICD-10-CM | POA: Diagnosis present

## 2021-11-04 DIAGNOSIS — Z953 Presence of xenogenic heart valve: Secondary | ICD-10-CM

## 2021-11-04 DIAGNOSIS — I480 Paroxysmal atrial fibrillation: Secondary | ICD-10-CM | POA: Diagnosis present

## 2021-11-04 DIAGNOSIS — D72829 Elevated white blood cell count, unspecified: Secondary | ICD-10-CM | POA: Diagnosis not present

## 2021-11-04 DIAGNOSIS — K7460 Unspecified cirrhosis of liver: Secondary | ICD-10-CM | POA: Diagnosis present

## 2021-11-04 DIAGNOSIS — Y92239 Unspecified place in hospital as the place of occurrence of the external cause: Secondary | ICD-10-CM | POA: Diagnosis not present

## 2021-11-04 DIAGNOSIS — D471 Chronic myeloproliferative disease: Secondary | ICD-10-CM | POA: Diagnosis present

## 2021-11-04 DIAGNOSIS — R278 Other lack of coordination: Secondary | ICD-10-CM | POA: Diagnosis not present

## 2021-11-04 DIAGNOSIS — I130 Hypertensive heart and chronic kidney disease with heart failure and stage 1 through stage 4 chronic kidney disease, or unspecified chronic kidney disease: Secondary | ICD-10-CM | POA: Diagnosis present

## 2021-11-04 DIAGNOSIS — Z471 Aftercare following joint replacement surgery: Secondary | ICD-10-CM | POA: Diagnosis not present

## 2021-11-04 DIAGNOSIS — D473 Essential (hemorrhagic) thrombocythemia: Secondary | ICD-10-CM | POA: Diagnosis present

## 2021-11-04 DIAGNOSIS — E119 Type 2 diabetes mellitus without complications: Secondary | ICD-10-CM | POA: Diagnosis not present

## 2021-11-04 DIAGNOSIS — Z8052 Family history of malignant neoplasm of bladder: Secondary | ICD-10-CM

## 2021-11-04 DIAGNOSIS — N179 Acute kidney failure, unspecified: Secondary | ICD-10-CM | POA: Diagnosis not present

## 2021-11-04 DIAGNOSIS — Z96642 Presence of left artificial hip joint: Secondary | ICD-10-CM | POA: Diagnosis present

## 2021-11-04 DIAGNOSIS — R791 Abnormal coagulation profile: Secondary | ICD-10-CM | POA: Diagnosis present

## 2021-11-04 DIAGNOSIS — Z881 Allergy status to other antibiotic agents status: Secondary | ICD-10-CM

## 2021-11-04 DIAGNOSIS — L89620 Pressure ulcer of left heel, unstageable: Secondary | ICD-10-CM | POA: Diagnosis present

## 2021-11-04 DIAGNOSIS — I5022 Chronic systolic (congestive) heart failure: Secondary | ICD-10-CM | POA: Diagnosis present

## 2021-11-04 DIAGNOSIS — G08 Intracranial and intraspinal phlebitis and thrombophlebitis: Secondary | ICD-10-CM | POA: Diagnosis present

## 2021-11-04 DIAGNOSIS — Z66 Do not resuscitate: Secondary | ICD-10-CM | POA: Diagnosis present

## 2021-11-04 DIAGNOSIS — Z885 Allergy status to narcotic agent status: Secondary | ICD-10-CM

## 2021-11-04 DIAGNOSIS — Z87442 Personal history of urinary calculi: Secondary | ICD-10-CM

## 2021-11-04 DIAGNOSIS — Z856 Personal history of leukemia: Secondary | ICD-10-CM

## 2021-11-04 DIAGNOSIS — I739 Peripheral vascular disease, unspecified: Secondary | ICD-10-CM | POA: Diagnosis present

## 2021-11-04 DIAGNOSIS — N401 Enlarged prostate with lower urinary tract symptoms: Secondary | ICD-10-CM | POA: Diagnosis present

## 2021-11-04 DIAGNOSIS — R197 Diarrhea, unspecified: Secondary | ICD-10-CM | POA: Diagnosis not present

## 2021-11-04 DIAGNOSIS — Z9842 Cataract extraction status, left eye: Secondary | ICD-10-CM

## 2021-11-04 DIAGNOSIS — Z6827 Body mass index (BMI) 27.0-27.9, adult: Secondary | ICD-10-CM

## 2021-11-04 DIAGNOSIS — E876 Hypokalemia: Secondary | ICD-10-CM | POA: Diagnosis not present

## 2021-11-04 DIAGNOSIS — D649 Anemia, unspecified: Secondary | ICD-10-CM | POA: Diagnosis present

## 2021-11-04 DIAGNOSIS — Z01818 Encounter for other preprocedural examination: Secondary | ICD-10-CM

## 2021-11-04 DIAGNOSIS — Z951 Presence of aortocoronary bypass graft: Secondary | ICD-10-CM

## 2021-11-04 DIAGNOSIS — I10 Essential (primary) hypertension: Secondary | ICD-10-CM

## 2021-11-04 DIAGNOSIS — L03116 Cellulitis of left lower limb: Secondary | ICD-10-CM | POA: Diagnosis not present

## 2021-11-04 DIAGNOSIS — K760 Fatty (change of) liver, not elsewhere classified: Secondary | ICD-10-CM | POA: Diagnosis present

## 2021-11-04 DIAGNOSIS — Z981 Arthrodesis status: Secondary | ICD-10-CM

## 2021-11-04 DIAGNOSIS — I350 Nonrheumatic aortic (valve) stenosis: Secondary | ICD-10-CM

## 2021-11-04 DIAGNOSIS — K625 Hemorrhage of anus and rectum: Secondary | ICD-10-CM | POA: Diagnosis not present

## 2021-11-04 DIAGNOSIS — I5023 Acute on chronic systolic (congestive) heart failure: Secondary | ICD-10-CM | POA: Diagnosis present

## 2021-11-04 DIAGNOSIS — E43 Unspecified severe protein-calorie malnutrition: Secondary | ICD-10-CM | POA: Diagnosis present

## 2021-11-04 DIAGNOSIS — N189 Chronic kidney disease, unspecified: Secondary | ICD-10-CM | POA: Diagnosis present

## 2021-11-04 DIAGNOSIS — Z961 Presence of intraocular lens: Secondary | ICD-10-CM | POA: Diagnosis present

## 2021-11-04 DIAGNOSIS — N138 Other obstructive and reflux uropathy: Secondary | ICD-10-CM | POA: Diagnosis present

## 2021-11-04 DIAGNOSIS — Z79899 Other long term (current) drug therapy: Secondary | ICD-10-CM

## 2021-11-04 DIAGNOSIS — Z888 Allergy status to other drugs, medicaments and biological substances status: Secondary | ICD-10-CM

## 2021-11-04 DIAGNOSIS — I4821 Permanent atrial fibrillation: Secondary | ICD-10-CM

## 2021-11-04 DIAGNOSIS — T502X5A Adverse effect of carbonic-anhydrase inhibitors, benzothiadiazides and other diuretics, initial encounter: Secondary | ICD-10-CM | POA: Diagnosis not present

## 2021-11-04 DIAGNOSIS — I4891 Unspecified atrial fibrillation: Secondary | ICD-10-CM | POA: Diagnosis present

## 2021-11-04 DIAGNOSIS — E1169 Type 2 diabetes mellitus with other specified complication: Secondary | ICD-10-CM | POA: Diagnosis present

## 2021-11-04 DIAGNOSIS — E1122 Type 2 diabetes mellitus with diabetic chronic kidney disease: Secondary | ICD-10-CM | POA: Diagnosis present

## 2021-11-04 DIAGNOSIS — Z9889 Other specified postprocedural states: Secondary | ICD-10-CM | POA: Diagnosis not present

## 2021-11-04 DIAGNOSIS — Z9841 Cataract extraction status, right eye: Secondary | ICD-10-CM

## 2021-11-04 DIAGNOSIS — E440 Moderate protein-calorie malnutrition: Secondary | ICD-10-CM | POA: Insufficient documentation

## 2021-11-04 DIAGNOSIS — R338 Other retention of urine: Secondary | ICD-10-CM | POA: Diagnosis present

## 2021-11-04 DIAGNOSIS — F05 Delirium due to known physiological condition: Secondary | ICD-10-CM | POA: Diagnosis not present

## 2021-11-04 HISTORY — PX: TOTAL HIP ARTHROPLASTY: SHX124

## 2021-11-04 LAB — CBC
HCT: 30.5 % — ABNORMAL LOW (ref 39.0–52.0)
HCT: 22.4 % — ABNORMAL LOW (ref 39.0–52.0)
Hemoglobin: 9.7 g/dL — ABNORMAL LOW (ref 13.0–17.0)
Hemoglobin: 7.2 g/dL — ABNORMAL LOW (ref 13.0–17.0)
MCH: 34.4 pg — ABNORMAL HIGH (ref 26.0–34.0)
MCH: 34.6 pg — ABNORMAL HIGH (ref 26.0–34.0)
MCHC: 31.8 g/dL (ref 30.0–36.0)
MCHC: 32.1 g/dL (ref 30.0–36.0)
MCV: 108.2 fL — ABNORMAL HIGH (ref 80.0–100.0)
MCV: 107.7 fL — ABNORMAL HIGH (ref 80.0–100.0)
Platelets: 200 10*3/uL (ref 150–400)
Platelets: 154 10*3/uL (ref 150–400)
RBC: 2.82 MIL/uL — ABNORMAL LOW (ref 4.22–5.81)
RBC: 2.08 MIL/uL — ABNORMAL LOW (ref 4.22–5.81)
RDW: 25.3 % — ABNORMAL HIGH (ref 11.5–15.5)
RDW: 26 % — ABNORMAL HIGH (ref 11.5–15.5)
WBC: 27.7 10*3/uL — ABNORMAL HIGH (ref 4.0–10.5)
WBC: 26.4 10*3/uL — ABNORMAL HIGH (ref 4.0–10.5)
nRBC: 0.8 % — ABNORMAL HIGH (ref 0.0–0.2)
nRBC: 0.4 % — ABNORMAL HIGH (ref 0.0–0.2)

## 2021-11-04 LAB — PREPARE RBC (CROSSMATCH)

## 2021-11-04 SURGERY — ARTHROPLASTY, HIP, TOTAL, ANTERIOR APPROACH
Anesthesia: Spinal | Site: Hip | Laterality: Left

## 2021-11-04 MED ORDER — POLYETHYLENE GLYCOL 3350 17 G PO PACK
17.0000 g | PACK | Freq: Every day | ORAL | Status: DC
  Administered 2021-11-04 – 2021-11-17 (×5): 17 g via ORAL
  Filled 2021-11-04 (×13): qty 1

## 2021-11-04 MED ORDER — ALBUTEROL SULFATE (2.5 MG/3ML) 0.083% IN NEBU: 2.5000 mg | INHALATION_SOLUTION | Freq: Four times a day (QID) | RESPIRATORY_TRACT | Status: DC | PRN

## 2021-11-04 MED ORDER — SODIUM CHLORIDE 0.9% IV SOLUTION: Freq: Once | INTRAVENOUS | Status: AC

## 2021-11-04 MED ORDER — MENTHOL 3 MG MT LOZG: 1.0000 | LOZENGE | OROMUCOSAL | Status: DC | PRN

## 2021-11-04 MED ORDER — DEXAMETHASONE SODIUM PHOSPHATE 10 MG/ML IJ SOLN
10.0000 mg | Freq: Once | INTRAMUSCULAR | Status: AC
  Administered 2021-11-05: 10 mg via INTRAVENOUS
  Filled 2021-11-04: qty 1

## 2021-11-04 MED ORDER — TRANEXAMIC ACID-NACL 1000-0.7 MG/100ML-% IV SOLN
INTRAVENOUS | Status: AC
  Filled 2021-11-04: qty 100

## 2021-11-04 MED ORDER — AMISULPRIDE (ANTIEMETIC) 5 MG/2ML IV SOLN: 10.0000 mg | Freq: Once | INTRAVENOUS | Status: DC | PRN

## 2021-11-04 MED ORDER — ZOLPIDEM TARTRATE 5 MG PO TABS
5.0000 mg | ORAL_TABLET | Freq: Every day | ORAL | Status: DC
  Administered 2021-11-05: 5 mg via ORAL
  Filled 2021-11-04: qty 1

## 2021-11-04 MED ORDER — PHENOL 1.4 % MT LIQD: 1.0000 | OROMUCOSAL | Status: DC | PRN

## 2021-11-04 MED ORDER — METOPROLOL TARTRATE 25 MG PO TABS
25.0000 mg | ORAL_TABLET | Freq: Once | ORAL | Status: AC
  Administered 2021-11-04: 25 mg via ORAL
  Filled 2021-11-04: qty 1

## 2021-11-04 MED ORDER — ONDANSETRON HCL 4 MG PO TABS: 4.0000 mg | ORAL_TABLET | Freq: Four times a day (QID) | ORAL | Status: DC | PRN

## 2021-11-04 MED ORDER — OXYCODONE HCL 5 MG PO TABS: 5.0000 mg | ORAL_TABLET | Freq: Once | ORAL | Status: DC | PRN

## 2021-11-04 MED ORDER — CEFAZOLIN SODIUM-DEXTROSE 2-4 GM/100ML-% IV SOLN
2.0000 g | Freq: Four times a day (QID) | INTRAVENOUS | Status: AC
  Administered 2021-11-04 – 2021-11-05 (×2): 2 g via INTRAVENOUS
  Filled 2021-11-04 (×2): qty 100

## 2021-11-04 MED ORDER — METOCLOPRAMIDE HCL 5 MG/ML IJ SOLN: 5.0000 mg | Freq: Three times a day (TID) | INTRAMUSCULAR | Status: DC | PRN

## 2021-11-04 MED ORDER — HYDROCODONE-ACETAMINOPHEN 7.5-325 MG PO TABS
1.0000 | ORAL_TABLET | ORAL | Status: DC | PRN
  Administered 2021-11-04 – 2021-11-05 (×2): 1 via ORAL
  Administered 2021-11-06: 2 via ORAL
  Filled 2021-11-04 (×2): qty 1
  Filled 2021-11-04: qty 2

## 2021-11-04 MED ORDER — DOCUSATE SODIUM 100 MG PO CAPS
100.0000 mg | ORAL_CAPSULE | Freq: Two times a day (BID) | ORAL | Status: DC
  Administered 2021-11-05 – 2021-11-08 (×4): 100 mg via ORAL
  Filled 2021-11-04 (×9): qty 1

## 2021-11-04 MED ORDER — ACETAMINOPHEN 500 MG PO TABS
1000.0000 mg | ORAL_TABLET | Freq: Once | ORAL | Status: AC
  Administered 2021-11-04: 1000 mg via ORAL
  Filled 2021-11-04: qty 2

## 2021-11-04 MED ORDER — ROSUVASTATIN CALCIUM 20 MG PO TABS
20.0000 mg | ORAL_TABLET | Freq: Every day | ORAL | Status: DC
  Administered 2021-11-04 – 2021-11-19 (×15): 20 mg via ORAL
  Filled 2021-11-04 (×15): qty 1

## 2021-11-04 MED ORDER — POVIDONE-IODINE 10 % EX SWAB
2.0000 | Freq: Once | CUTANEOUS | Status: AC
  Administered 2021-11-04: 2 via TOPICAL

## 2021-11-04 MED ORDER — SODIUM CHLORIDE 0.9 % IV BOLUS
500.0000 mL | Freq: Once | INTRAVENOUS | Status: AC
  Administered 2021-11-04: 500 mL via INTRAVENOUS

## 2021-11-04 MED ORDER — CEFAZOLIN SODIUM-DEXTROSE 2-4 GM/100ML-% IV SOLN
2.0000 g | INTRAVENOUS | Status: AC
  Administered 2021-11-04: 2 g via INTRAVENOUS
  Filled 2021-11-04: qty 100

## 2021-11-04 MED ORDER — ONDANSETRON HCL 4 MG/2ML IJ SOLN
INTRAMUSCULAR | Status: DC | PRN
  Administered 2021-11-04: 4 mg via INTRAVENOUS

## 2021-11-04 MED ORDER — PHENYLEPHRINE HCL-NACL 20-0.9 MG/250ML-% IV SOLN
INTRAVENOUS | Status: DC | PRN
  Administered 2021-11-04: 50 ug/min via INTRAVENOUS

## 2021-11-04 MED ORDER — ALBUMIN HUMAN 5 % IV SOLN
INTRAVENOUS | Status: AC
  Filled 2021-11-04: qty 250

## 2021-11-04 MED ORDER — FENTANYL CITRATE (PF) 100 MCG/2ML IJ SOLN
INTRAMUSCULAR | Status: AC
  Filled 2021-11-04: qty 2

## 2021-11-04 MED ORDER — SENNA 8.6 MG PO TABS
2.0000 | ORAL_TABLET | Freq: Every day | ORAL | Status: DC
  Administered 2021-11-05: 17.2 mg via ORAL
  Filled 2021-11-04 (×3): qty 2

## 2021-11-04 MED ORDER — DEXAMETHASONE SODIUM PHOSPHATE 10 MG/ML IJ SOLN
8.0000 mg | Freq: Once | INTRAMUSCULAR | Status: AC
  Administered 2021-11-04: 5 mg via INTRAVENOUS

## 2021-11-04 MED ORDER — METHOCARBAMOL 500 MG IVPB - SIMPLE MED: 500.0000 mg | Freq: Four times a day (QID) | INTRAVENOUS | Status: DC | PRN

## 2021-11-04 MED ORDER — ALBUMIN HUMAN 5 % IV SOLN
12.5000 g | Freq: Once | INTRAVENOUS | Status: AC
  Administered 2021-11-04: 12.5 g via INTRAVENOUS

## 2021-11-04 MED ORDER — DIPHENHYDRAMINE HCL 12.5 MG/5ML PO ELIX
12.5000 mg | ORAL_SOLUTION | ORAL | Status: DC | PRN
  Administered 2021-11-09: 25 mg via ORAL
  Filled 2021-11-04: qty 10

## 2021-11-04 MED ORDER — DEXAMETHASONE SODIUM PHOSPHATE 10 MG/ML IJ SOLN
INTRAMUSCULAR | Status: AC
  Filled 2021-11-04: qty 1

## 2021-11-04 MED ORDER — HYDROXYUREA 500 MG PO CAPS: 500.0000 mg | ORAL_CAPSULE | Freq: Two times a day (BID) | ORAL | Status: DC

## 2021-11-04 MED ORDER — METOPROLOL TARTRATE 25 MG PO TABS
25.0000 mg | ORAL_TABLET | Freq: Two times a day (BID) | ORAL | Status: DC
  Administered 2021-11-05 – 2021-11-19 (×28): 25 mg via ORAL
  Filled 2021-11-04 (×29): qty 1

## 2021-11-04 MED ORDER — TRANEXAMIC ACID-NACL 1000-0.7 MG/100ML-% IV SOLN
1000.0000 mg | INTRAVENOUS | Status: AC
  Administered 2021-11-04 (×2): 1000 mg via INTRAVENOUS
  Filled 2021-11-04: qty 100

## 2021-11-04 MED ORDER — PANTOPRAZOLE SODIUM 40 MG PO TBEC
40.0000 mg | DELAYED_RELEASE_TABLET | Freq: Two times a day (BID) | ORAL | Status: DC
  Administered 2021-11-04 – 2021-11-19 (×29): 40 mg via ORAL
  Filled 2021-11-04 (×30): qty 1

## 2021-11-04 MED ORDER — ACETAMINOPHEN 325 MG PO TABS
325.0000 mg | ORAL_TABLET | Freq: Four times a day (QID) | ORAL | Status: DC | PRN
  Administered 2021-11-05 – 2021-11-16 (×10): 650 mg via ORAL
  Administered 2021-11-16: 325 mg via ORAL
  Administered 2021-11-16 – 2021-11-17 (×3): 650 mg via ORAL
  Administered 2021-11-19: 325 mg via ORAL
  Filled 2021-11-04 (×12): qty 2
  Filled 2021-11-04: qty 1
  Filled 2021-11-04 (×3): qty 2

## 2021-11-04 MED ORDER — VASOPRESSIN 20 UNIT/ML IV SOLN
INTRAVENOUS | Status: DC | PRN
  Administered 2021-11-04: 1 [IU] via INTRAVENOUS

## 2021-11-04 MED ORDER — OXYCODONE HCL 5 MG/5ML PO SOLN: 5.0000 mg | Freq: Once | ORAL | Status: DC | PRN

## 2021-11-04 MED ORDER — FUROSEMIDE 20 MG PO TABS: 20.0000 mg | ORAL_TABLET | Freq: Every day | ORAL | Status: DC | PRN

## 2021-11-04 MED ORDER — MIDAZOLAM HCL 2 MG/2ML IJ SOLN
INTRAMUSCULAR | Status: AC
  Filled 2021-11-04: qty 2

## 2021-11-04 MED ORDER — FENTANYL CITRATE (PF) 100 MCG/2ML IJ SOLN
INTRAMUSCULAR | Status: DC | PRN
  Administered 2021-11-04: 25 ug via INTRAVENOUS
  Administered 2021-11-04: 50 ug via INTRAVENOUS

## 2021-11-04 MED ORDER — APIXABAN 5 MG PO TABS: 5.0000 mg | ORAL_TABLET | Freq: Two times a day (BID) | ORAL | Status: DC

## 2021-11-04 MED ORDER — MIDAZOLAM HCL 5 MG/5ML IJ SOLN
INTRAMUSCULAR | Status: DC | PRN
  Administered 2021-11-04: 2 mg via INTRAVENOUS

## 2021-11-04 MED ORDER — LORATADINE 10 MG PO TABS
10.0000 mg | ORAL_TABLET | Freq: Every day | ORAL | Status: DC
  Administered 2021-11-05 – 2021-11-19 (×14): 10 mg via ORAL
  Filled 2021-11-04 (×15): qty 1

## 2021-11-04 MED ORDER — METHOCARBAMOL 500 MG PO TABS
500.0000 mg | ORAL_TABLET | Freq: Four times a day (QID) | ORAL | Status: DC | PRN
  Administered 2021-11-10 – 2021-11-18 (×11): 500 mg via ORAL
  Filled 2021-11-04 (×11): qty 1

## 2021-11-04 MED ORDER — 0.9 % SODIUM CHLORIDE (POUR BTL) OPTIME
TOPICAL | Status: DC | PRN
  Administered 2021-11-04: 1000 mL

## 2021-11-04 MED ORDER — METOCLOPRAMIDE HCL 5 MG PO TABS: 5.0000 mg | ORAL_TABLET | Freq: Three times a day (TID) | ORAL | Status: DC | PRN

## 2021-11-04 MED ORDER — PHENYLEPHRINE 80 MCG/ML (10ML) SYRINGE FOR IV PUSH (FOR BLOOD PRESSURE SUPPORT)
PREFILLED_SYRINGE | INTRAVENOUS | Status: DC | PRN
  Administered 2021-11-04 (×2): 160 ug via INTRAVENOUS
  Administered 2021-11-04: 80 ug via INTRAVENOUS
  Administered 2021-11-04 (×2): 160 ug via INTRAVENOUS
  Administered 2021-11-04: 80 ug via INTRAVENOUS

## 2021-11-04 MED ORDER — LACTATED RINGERS IV SOLN: INTRAVENOUS | Status: DC

## 2021-11-04 MED ORDER — BISACODYL 10 MG RE SUPP: 10.0000 mg | Freq: Every day | RECTAL | Status: DC | PRN

## 2021-11-04 MED ORDER — POTASSIUM CHLORIDE CRYS ER 10 MEQ PO TBCR: 20.0000 meq | EXTENDED_RELEASE_TABLET | Freq: Every day | ORAL | Status: DC | PRN

## 2021-11-04 MED ORDER — METOPROLOL TARTRATE 25 MG PO TABS: 25.0000 mg | ORAL_TABLET | Freq: Two times a day (BID) | ORAL | Status: DC

## 2021-11-04 MED ORDER — HYDROMORPHONE HCL 1 MG/ML IJ SOLN: 0.5000 mg | INTRAMUSCULAR | Status: DC | PRN

## 2021-11-04 MED ORDER — SODIUM CHLORIDE 0.9 % IV SOLN: INTRAVENOUS | Status: DC

## 2021-11-04 MED ORDER — PROPOFOL 1000 MG/100ML IV EMUL
INTRAVENOUS | Status: AC
  Filled 2021-11-04: qty 100

## 2021-11-04 MED ORDER — HYDROCODONE-ACETAMINOPHEN 5-325 MG PO TABS
1.0000 | ORAL_TABLET | ORAL | Status: DC | PRN
  Administered 2021-11-07: 1 via ORAL
  Filled 2021-11-04: qty 1

## 2021-11-04 MED ORDER — PROPOFOL 10 MG/ML IV BOLUS
INTRAVENOUS | Status: DC | PRN
  Administered 2021-11-04 (×2): 10 mg via INTRAVENOUS

## 2021-11-04 MED ORDER — TRANEXAMIC ACID-NACL 1000-0.7 MG/100ML-% IV SOLN
1000.0000 mg | Freq: Once | INTRAVENOUS | Status: AC
  Administered 2021-11-04: 1000 mg via INTRAVENOUS
  Filled 2021-11-04: qty 100

## 2021-11-04 MED ORDER — ONDANSETRON HCL 4 MG/2ML IJ SOLN
INTRAMUSCULAR | Status: AC
  Filled 2021-11-04: qty 2

## 2021-11-04 MED ORDER — BUPIVACAINE IN DEXTROSE 0.75-8.25 % IT SOLN
INTRATHECAL | Status: DC | PRN
  Administered 2021-11-04: 1.6 mL via INTRATHECAL

## 2021-11-04 MED ORDER — FENTANYL CITRATE PF 50 MCG/ML IJ SOSY: 25.0000 ug | PREFILLED_SYRINGE | INTRAMUSCULAR | Status: DC | PRN

## 2021-11-04 MED ORDER — ALBUMIN HUMAN 5 % IV SOLN: INTRAVENOUS | Status: DC | PRN

## 2021-11-04 MED ORDER — FERROUS SULFATE 325 (65 FE) MG PO TABS
325.0000 mg | ORAL_TABLET | Freq: Three times a day (TID) | ORAL | Status: DC
  Administered 2021-11-04: 325 mg via ORAL
  Filled 2021-11-04: qty 1

## 2021-11-04 MED ORDER — ASPIRIN 81 MG PO TBEC
81.0000 mg | DELAYED_RELEASE_TABLET | Freq: Every day | ORAL | Status: DC
  Administered 2021-11-05: 81 mg via ORAL
  Filled 2021-11-04: qty 1

## 2021-11-04 MED ORDER — PROPOFOL 500 MG/50ML IV EMUL
INTRAVENOUS | Status: DC | PRN
  Administered 2021-11-04: 50 ug/kg/min via INTRAVENOUS

## 2021-11-04 MED ORDER — ONDANSETRON HCL 4 MG/2ML IJ SOLN: 4.0000 mg | Freq: Four times a day (QID) | INTRAMUSCULAR | Status: DC | PRN

## 2021-11-04 MED ORDER — STERILE WATER FOR IRRIGATION IR SOLN
Status: DC | PRN
  Administered 2021-11-04: 2000 mL

## 2021-11-04 MED ORDER — VASOPRESSIN 20 UNIT/ML IV SOLN
INTRAVENOUS | Status: AC
  Filled 2021-11-04: qty 1

## 2021-11-04 SURGICAL SUPPLY — 43 items
ADH SKN CLS APL DERMABOND .7 (GAUZE/BANDAGES/DRESSINGS) ×1
ADH SKN CLS LQ APL DERMABOND (GAUZE/BANDAGES/DRESSINGS) ×1
BAG COUNTER SPONGE SURGICOUNT (BAG) IMPLANT
BAG DECANTER FOR FLEXI CONT (MISCELLANEOUS) IMPLANT
BAG SPEC THK2 15X12 ZIP CLS (MISCELLANEOUS)
BAG SPNG CNTER NS LX DISP (BAG) ×1
BAG ZIPLOCK 12X15 (MISCELLANEOUS) IMPLANT
BLADE SAG 18X100X1.27 (BLADE) ×2 IMPLANT
COVER PERINEAL POST (MISCELLANEOUS) ×2 IMPLANT
COVER SURGICAL LIGHT HANDLE (MISCELLANEOUS) ×2 IMPLANT
CUP ACETBLR 54 OD PINNACLE (Hips) IMPLANT
DERMABOND ADVANCED .7 DNX12 (GAUZE/BANDAGES/DRESSINGS) ×2 IMPLANT
DERMABOND ADVANCED .7 DNX6 (GAUZE/BANDAGES/DRESSINGS) IMPLANT
DRAPE FOOT SWITCH (DRAPES) ×2 IMPLANT
DRAPE STERI IOBAN 125X83 (DRAPES) ×2 IMPLANT
DRAPE U-SHAPE 47X51 STRL (DRAPES) ×4 IMPLANT
DRESSING AQUACEL AG SP 3.5X10 (GAUZE/BANDAGES/DRESSINGS) ×2 IMPLANT
DRSG AQUACEL AG ADV 3.5X10 (GAUZE/BANDAGES/DRESSINGS) IMPLANT
DRSG AQUACEL AG SP 3.5X10 (GAUZE/BANDAGES/DRESSINGS) ×1
DURAPREP 26ML APPLICATOR (WOUND CARE) ×2 IMPLANT
ELECT REM PT RETURN 15FT ADLT (MISCELLANEOUS) ×2 IMPLANT
GLOVE BIO SURGEON STRL SZ 6 (GLOVE) ×2 IMPLANT
GLOVE BIOGEL PI IND STRL 6.5 (GLOVE) ×2 IMPLANT
GLOVE BIOGEL PI IND STRL 7.5 (GLOVE) ×2 IMPLANT
GLOVE ORTHO TXT STRL SZ7.5 (GLOVE) ×4 IMPLANT
GOWN STRL REUS W/ TWL LRG LVL3 (GOWN DISPOSABLE) ×6 IMPLANT
GOWN STRL REUS W/TWL LRG LVL3 (GOWN DISPOSABLE) ×3
HEAD CERAMIC 36 PLUS5 (Hips) IMPLANT
HOLDER FOLEY CATH W/STRAP (MISCELLANEOUS) ×2 IMPLANT
KIT TURNOVER KIT A (KITS) IMPLANT
LINER NEUTRAL 54X36MM PLUS 4 (Hips) IMPLANT
PACK ANTERIOR HIP CUSTOM (KITS) ×2 IMPLANT
SCREW 6.5MMX30MM (Screw) IMPLANT
STEM FEM ACTIS HIGH SZ8 (Stem) IMPLANT
SUT MNCRL AB 4-0 PS2 18 (SUTURE) ×2 IMPLANT
SUT STRATAFIX 0 PDS 27 VIOLET (SUTURE) ×1
SUT VIC AB 1 CT1 36 (SUTURE) ×6 IMPLANT
SUT VIC AB 2-0 CT1 27 (SUTURE) ×2
SUT VIC AB 2-0 CT1 TAPERPNT 27 (SUTURE) ×4 IMPLANT
SUTURE STRATFX 0 PDS 27 VIOLET (SUTURE) ×2 IMPLANT
TRAY FOLEY MTR SLVR 16FR STAT (SET/KITS/TRAYS/PACK) IMPLANT
TUBE SUCTION HIGH CAP CLEAR NV (SUCTIONS) ×2 IMPLANT
WATER STERILE IRR 1000ML POUR (IV SOLUTION) ×2 IMPLANT

## 2021-11-04 NOTE — Anesthesia Procedure Notes (Signed)
Spinal  Patient location during procedure: OR Start time: 11/04/2021 12:04 PM End time: 11/04/2021 12:07 PM Reason for block: surgical anesthesia Staffing Performed: anesthesiologist  Anesthesiologist: Brennan Bailey, MD Performed by: Brennan Bailey, MD Authorized by: Brennan Bailey, MD   Preanesthetic Checklist Completed: patient identified, IV checked, risks and benefits discussed, surgical consent, monitors and equipment checked, pre-op evaluation and timeout performed Spinal Block Patient position: sitting Prep: DuraPrep and site prepped and draped Patient monitoring: continuous pulse ox, blood pressure and heart rate Approach: right paramedian Location: L3-4 Injection technique: single-shot Needle Needle type: Whitacre  Needle gauge: 24 G Needle length: 9 cm Assessment Events: CSF return and second provider Additional Notes Risks, benefits, and alternative discussed. Patient gave consent to procedure. Prepped and draped in sitting position. Patient sedated but responsive to voice. First attempt by CRNA unable to access intrathecal space at L3-4. Clear CSF obtained after 2 attempts by myself (first midline L3-4, second right paramedian with Whitacre 22g). Positive terminal aspiration. No pain or paraesthesias with injection. Patient tolerated procedure well. Vital signs stable. Tawny Asal, MD

## 2021-11-04 NOTE — Interval H&P Note (Signed)
History and Physical Interval Note:  11/04/2021 9:08 AM  Justin [name]  has presented today for surgery, with the diagnosis of Left hip osteoarthritis.  The various methods of treatment have been discussed with the patient and family. After consideration of risks, benefits and other options for treatment, the patient has consented to  Procedure(s): TOTAL HIP ARTHROPLASTY ANTERIOR APPROACH (Left) as a surgical intervention.  The patient's history has been reviewed, patient examined, no change in status, stable for surgery.  I have reviewed the patient's chart and labs.  Questions were answered to the patient's satisfaction.     Mauri Pole

## 2021-11-04 NOTE — Op Note (Signed)
NAME:  Justin [name]                ACCOUNT NO.: [id number]      MEDICAL RECORD NO.: [id number]      FACILITY:  [facility]      PHYSICIAN:  Mauri Pole  DATE OF BIRTH:  1955/09/27     DATE OF PROCEDURE:  11/04/2021                                 OPERATIVE REPORT         PREOPERATIVE DIAGNOSIS: Left  hip osteoarthritis.      POSTOPERATIVE DIAGNOSIS:  Left hip osteoarthritis.      PROCEDURE:  Left total hip replacement through an anterior approach   utilizing DePuy THR system, component size 54 mm pinnacle cup, a size 36+4 neutral   Altrex liner, a size 8 Hi Actis stem with a 36+5 delta ceramic   ball.      SURGEON:  Pietro Cassis. Alvan Dame, M.D.      ASSISTANT:  Costella Hatcher, PA-C     ANESTHESIA:  General and Spinal.      SPECIMENS:  None.      COMPLICATIONS:  None.      BLOOD LOSS:  2000 cc     DRAINS:  None.      INDICATION OF THE PROCEDURE:  Justin [name] is a 66 y.o. male who had   presented to office for evaluation of left hip pain.  Radiographs revealed   progressive degenerative changes with bone-on-bone   articulation of the  hip joint, including subchondral cystic changes and osteophytes.  The patient had painful limited range of   motion significantly affecting their overall quality of life and function.  The patient was failing to    respond to conservative measures including medications and/or injections and activity modification and at this point was ready   to proceed with more definitive measures.  Consent was obtained for   benefit of pain relief.  Specific risks of infection, DVT, component   failure, dislocation, neurovascular injury, and need for revision surgery were reviewed in the office as well discussion of   the anterior versus posterior approach were reviewed.     PROCEDURE IN DETAIL:  The patient was brought to operative theater.   Once adequate anesthesia, preoperative antibiotics, 2 gm of Ancef, 1 gm of Tranexamic  Acid, and 10 mg of Decadron were administered, the patient was positioned supine on the [employer] table.  Once the patient was safely positioned with adequate padding of boney prominences we predraped out the hip, and used fluoroscopy to confirm orientation of the pelvis.      The left hip was then prepped and draped from proximal iliac crest to   mid thigh with a shower curtain technique.      Time-out was performed identifying the patient, planned procedure, and the appropriate extremity.     An incision was then made 2 cm lateral to the   anterior superior iliac spine extending over the orientation of the   tensor fascia lata muscle and sharp dissection was carried down to the   fascia of the muscle.      The fascia was then incised.  The muscle belly was identified and swept   laterally and retractor placed along the superior neck.  Following   cauterization of the circumflex vessels and  removing some pericapsular   fat, a second cobra retractor was placed on the inferior neck.  A T-capsulotomy was made along the line of the   superior neck to the trochanteric fossa, then extended proximally and   distally.  Tag sutures were placed and the retractors were then placed   intracapsular.  We then identified the trochanteric fossa and   orientation of my neck cut and then made a neck osteotomy with the femur on traction.  The femoral   head was removed without difficulty or complication.  Traction was let   off and retractors were placed posterior and anterior around the   acetabulum.      The labrum and foveal tissue were debrided.  I began reaming with a 50 mm   reamer and reamed up to 53 mm reamer with good bony bed preparation and a 54 mm  cup was chosen.  The final 54 mm Pinnacle cup was then impacted under fluoroscopy to confirm the depth of penetration and orientation with respect to   Abduction and forward flexion.  A screw was placed into the ilium followed by the hole eliminator.   The final   36+4 neutral Altrex liner was impacted with good visualized rim fit.  The cup was positioned anatomically within the acetabular portion of the pelvis.      At this point, the femur was rolled to 100 degrees.  Further capsule was   released off the inferior aspect of the femoral neck.  I then   released the superior capsule proximally.  With the leg in a neutral position the hook was placed laterally   along the femur under the vastus lateralis origin and elevated manually and then held in position using the hook attachment on the bed.  The leg was then extended and adducted with the leg rolled to 100   degrees of external rotation.  Retractors were placed along the medial calcar and posteriorly over the greater trochanter.  Once the proximal femur was fully   exposed, I used a box osteotome to set orientation.  I then began   broaching with the starting chili pepper broach and passed this by hand and then broached up to 8.  With the 8 broach in place I chose a high offset neck and did several trial reductions.  The offset was appropriate, leg lengths   appeared to be equal best matched with the +5 head ball trial confirmed radiographically.   Given these findings, I went ahead and dislocated the hip, repositioned all   retractors and positioned the right hip in the extended and abducted position.  The final 8 Hi Actis stem was   chosen and it was impacted down to the level of neck cut.  Based on this   and the trial reductions, a final 36+5 delta ceramic ball was chosen and   impacted onto a clean and dry trunnion, and the hip was reduced.  The   hip had been irrigated throughout the case again at this point.  I did   reapproximate the superior capsular leaflet to the anterior leaflet   using #1 Vicryl.  The fascia of the   tensor fascia lata muscle was then reapproximated using #1 Vicryl and #0 Stratafix sutures.  The   remaining wound was closed with 2-0 Vicryl and running 4-0  Monocryl.   The hip was cleaned, dried, and dressed sterilely using Dermabond and   Aquacel dressing.  The patient was then brought  to recovery room in stable condition tolerating the procedure well.    Costella Hatcher, PA-C was present for the entirety of the case involved from   preoperative positioning, perioperative retractor management, general   facilitation of the case, as well as primary wound closure as assistant.            Pietro Cassis Alvan Dame, M.D.        11/04/2021 11:58 AM

## 2021-11-04 NOTE — Consult Note (Signed)
Medical Consultation   Justin [name]  OIZ:[id number]  DOB: [birth year]  DOA: 11/04/2021  PCP: [name], MD  Outpatient Specialists: Hematologist: Dr. Marin Olp   Requesting physician: Dr. Alvan Dame  Reason for consultation: Concern for volume overload posttransfusion postoperatively.  History of Present Illness: Justin [name] is an 66 y.o. male with history of essential thrombocythemia being followed by hematology/oncology in the outpatient setting, history of functional disability to the left hip secondary to end-stage arthritis, failed outpatient nonsurgical conservative treatment greater than 12 weeks ago was admitted to the orthopedic service for left total hip arthroplasty.  Patient denies any recent fevers, no chills, no nausea, no vomiting, no chest pain, no abdominal pain, no diarrhea, no melena, no hematemesis, no hematochezia, no syncopal episodes, no lightheadedness, no dizziness.  Patient does endorse some shortness of breath over the past week improved with inhalers and currently denies any acute shortness of breath on my evaluation. Patient noted to have had significant blood loss in the perioperative period and received 2 units packed transfusions perioperatively with posttransfusion CBC with a hemoglobin at 9.7. -Per orthopedics patient also noted to have received 3 g of tranexamic acid. -Patient transferred to the floor, orthopedics requested consultation to monitor closely for volume overload and management of other chronic medical issues. Patient at time of interview medically stable with no significant complaints.       Review of Systems:  ROS As per HPI otherwise 10 point review of systems negative.    Past Medical History: Past Medical History:  Diagnosis Date   Acute meniscal tear of knee LEFT   Anemia    Aortic stenosis 12/01/2017   NONRHEUMATIC, AORTIC VALVE CALCIFICATIONS, MILD TO MODERATE REGURG, MILD TO MODERATE CALCIFIED  ANNULUS per ECHO 10/25/17 @ [facility] [address]   Arthritis    Atrial fibrillation ([place]) 11/12/2017   AT O/V WITH PCP   Cancer ([place])    skin right arm   Coronary artery disease    Dyspnea    GERD (gastroesophageal reflux disease)    Headache    hx of migraines   Heart murmur MILD-- ASYMPTOMATIC   History of kidney stones    Hyperlipidemia    Hypertension    Left knee pain    PAD (peripheral artery disease) (H[facility])    left leg claudication   Sleep apnea     Past Surgical History: Past Surgical History:  Procedure Laterality Date   AORTIC VALVE REPLACEMENT N/A 01/03/2018   Procedure: AORTIC VALVE REPLACEMENT (AVR) using 34m Magna Ease Bioprosthesis Aortic Valve;  Surgeon: VIvin Poot MD;  [place]  Service: Open Heart Surgery;  Laterality: N/A;   APPENDECTOMY  1998   BIOPSY  08/11/2018   Procedure: BIOPSY;  Surgeon: NMauri Pole MD;  [place]  Service: Endoscopy;;   BIOPSY  08/20/2021   Procedure: BIOPSY;  Surgeon: MIrving Copas, MD;  [place]  Service: Gastroenterology;;   CATARACT EXTRACTION W/ INTRAOCULAR LENS  IMPLANT, BILATERAL  1998/  2000   CERVICAL FUSION  1985   C4 - 5   COLONOSCOPY N/A 08/21/2021   Procedure: COLONOSCOPY;  Surgeon: CDaryel November MD;  [place]  Service: Gastroenterology;  Laterality: N/A;   CORONARY ARTERY BYPASS GRAFT N/A 01/03/2018   Procedure: CORONARY ARTERY BYPASS GRAFTING (CABG) x 4 (LIMA to LAD, SVG to DIAGONAL, SVG to RAMUS INTERMEDIATE, and SVG to PDA), USING  LEFT INFTERNAL MAMMARY ARTERY AND;  Surgeon: Prescott Gum, Collier Salina, MD;  Location: [place];  Service: Open Heart Surgery;  Laterality: N/A;   ENDARTERECTOMY Right 01/03/2018   Procedure: ENDARTERECTOMY CAROTID;  Surgeon: Marty Heck, MD;  Location: [facility];  Service: Vascular;  Laterality: Right;   ESOPHAGOGASTRODUODENOSCOPY (EGD) WITH PROPOFOL N/A 08/11/2018   Procedure: ESOPHAGOGASTRODUODENOSCOPY (EGD) WITH  PROPOFOL;  Surgeon: Mauri Pole, MD;  Location: [place];  Service: Endoscopy;  Laterality: N/A;   ESOPHAGOGASTRODUODENOSCOPY (EGD) WITH PROPOFOL N/A 08/20/2021   Procedure: ESOPHAGOGASTRODUODENOSCOPY (EGD) WITH PROPOFOL;  Surgeon: Rush Landmark Telford Nab., MD;  Location: [place];  Service: Gastroenterology;  Laterality: N/A;   HOT HEMOSTASIS N/A 08/20/2021   Procedure: HOT HEMOSTASIS (ARGON PLASMA COAGULATION/BICAP);  Surgeon: Irving Copas., MD;  Location: [place];  Service: Gastroenterology;  Laterality: N/A;   HOT HEMOSTASIS N/A 08/21/2021   Procedure: HOT HEMOSTASIS (ARGON PLASMA COAGULATION/BICAP);  Surgeon: [name], MD;  Location: [place];  Service: Gastroenterology;  Laterality: N/A;   KNEE ARTHROSCOPY W/ MENISCECTOMY  1991  1987   LEFT KNEE   x 2   NASAL SINUS SURGERY  1982   ORIF ANKLE FRACTURE Right 10/07/2019   Procedure: OPEN REDUCTION INTERNAL FIXATION (ORIF) ANKLE FRACTURE;  Surgeon: Hiram Gash, MD;  Location: [place];  Service: Orthopedics;  Laterality: Right;  Regional RIGHT  ankle block as well.   RIGHT/LEFT HEART CATH AND CORONARY ANGIOGRAPHY N/A 12/24/2017   Procedure: RIGHT/LEFT HEART CATH AND CORONARY ANGIOGRAPHY;  Surgeon: Jolaine Artist, MD;  Location: [place] [facility];  Service: Cardiovascular;  Laterality: N/A;   ROTATOR CUFF REPAIR  09-04-2005   LEFT SHOULDER   TEE WITHOUT CARDIOVERSION N/A 12/24/2017   Procedure: TRANSESOPHAGEAL ECHOCARDIOGRAM (TEE);  Surgeon: Jolaine Artist, MD;  Location: [facility];  Service: Cardiovascular;  Laterality: N/A;   TEE WITHOUT CARDIOVERSION N/A 01/03/2018   Procedure: TRANSESOPHAGEAL ECHOCARDIOGRAM (TEE);  Surgeon: Prescott Gum, Collier Salina, MD;  Location: [place];  Service: Open Heart Surgery;  Laterality: N/A;   TONSILLECTOMY  AGE 42   TOTAL HIP ARTHROPLASTY Right 12/23/2018   Procedure: TOTAL HIP ARTHROPLASTY ANTERIOR APPROACH;  Surgeon: Paralee Cancel, MD;  Location: [facility];  Service:  Orthopedics;  Laterality: Right;  70 mins     Allergies:   Allergies  Allergen Reactions   Codeine Swelling and Other (See Comments)    Tolerates hydrocodone, tramadol, and oxycodone    Dilaudid [Hydromorphone] Anxiety    Hallucinations    Doxycycline Itching and Rash     Social History:  reports that he quit smoking about 3 years ago. His smoking use included cigars and cigarettes. He has a 5.75 pack-year smoking history. He has never used smokeless tobacco. He reports that he does not currently use alcohol after a past usage of about 2.0 standard drinks of alcohol per week. He reports that he does not use drugs.   Family History: Family History  Adopted: Yes  Problem Relation Age of Onset   Bladder Cancer Father        adopted father   Patient adopted.  Does not know of his biological parents or the other medical history.   Physical Exam: Vitals:   11/04/21 1630 11/04/21 1645 11/04/21 1713 11/04/21 1723  BP: 94/77 107/60 100/66   Pulse:   88   Resp: '15 20 18   '$ Temp:  97.7 F (36.5 C) (!) 97.5 F (36.4 C)   TempSrc:   Oral   SpO2: 99%  (!) 87%  94%  Weight:      Height:        Constitutional: Alert and awake, oriented x3, not in any acute distress.  Pallor. Eyes: PERLA, EOMI, irises appear normal, anicteric sclera,  ENMT: external ears and nose appear normal, normal hearing.            Lips appears normal, oropharynx mucosa, tongue, posterior pharynx appear normal  Neck: neck appears normal, no masses, normal ROM, no thyromegaly, no JVD  CVS: Irregularly irregular.  3/6 SEM left lower sternal border, 1+ bilateral lower extremity edema.  Respiratory:  clear to auscultation bilaterally, no wheezing, rales or rhonchi. Respiratory effort normal. No accessory muscle use.  Abdomen: soft nontender, nondistended, normal bowel sounds, no hepatosplenomegaly, no hernias  Musculoskeletal: : no cyanosis, clubbing.  1+ bilateral lower extremity edema.  Left hip with postop  dressing in place C/D/I.   Neuro: Cranial nerves II-XII intact, strength, sensation, reflexes Psych: judgement and insight appear normal, stable mood and affect, mental status Skin: no rashes or lesions or ulcers, no induration or nodules   Data reviewed:  I have personally reviewed following labs and imaging studies Labs:  CBC: Recent Labs  Lab 10/29/21 1436 11/03/21 1000 11/04/21 1435  WBC 17.4* 22.3* 27.7*  NEUTROABS  --  19.0*  --   HGB 11.0* 11.8* 9.7*  HCT 34.2* 35.8* 30.5*  MCV 109.6* 110.2* 108.2*  PLT 169 203 923    Basic Metabolic Panel: Recent Labs  Lab 10/29/21 1436 11/03/21 1000  NA 138 144  K 3.3* 4.0  CL 107 108  CO2 23 25  GLUCOSE 114* 126*  BUN 17 14  CREATININE 1.50* 1.27*  CALCIUM 8.5* 9.8   GFR Estimated Creatinine Clearance: 50.9 mL/min (A) (by C-G formula based on SCr of 1.27 mg/dL (H)). Liver Function Tests: Recent Labs  Lab 11/03/21 1000  AST 18  ALT 9  ALKPHOS 102  BILITOT 1.6*  PROT 7.3  ALBUMIN 3.6   No results for input(s): "LIPASE", "AMYLASE" in the last 168 hours. No results for input(s): "AMMONIA" in the last 168 hours. Coagulation profile No results for input(s): "INR", "PROTIME" in the last 168 hours.  Cardiac Enzymes: No results for input(s): "CKTOTAL", "CKMB", "CKMBINDEX", "TROPONINI" in the last 168 hours. BNP: Invalid input(s): "POCBNP" CBG: Recent Labs  Lab 10/29/21 1441  GLUCAP 115*   D-Dimer No results for input(s): "DDIMER" in the last 72 hours. Hgb A1c No results for input(s): "HGBA1C" in the last 72 hours. Lipid Profile No results for input(s): "CHOL", "HDL", "LDLCALC", "TRIG", "CHOLHDL", "LDLDIRECT" in the last 72 hours. Thyroid function studies No results for input(s): "TSH", "T4TOTAL", "T3FREE", "THYROIDAB" in the last 72 hours.  Invalid input(s): "FREET3" Anemia work up Recent Labs    11/03/21 1000 11/03/21 1001  FERRITIN 110  --   TIBC 242*  --   IRON 35*  --   RETICCTPCT  --  2.3    Urinalysis    Component Value Date/Time   COLORURINE YELLOW 08/19/2021 0121   APPEARANCEUR CLEAR 08/19/2021 0121   LABSPEC 1.006 08/19/2021 0121   PHURINE 5.0 08/19/2021 0121   GLUCOSEU NEGATIVE 08/19/2021 0121   HGBUR NEGATIVE 08/19/2021 0121   BILIRUBINUR NEGATIVE 08/19/2021 0121   KETONESUR NEGATIVE 08/19/2021 0121   PROTEINUR NEGATIVE 08/19/2021 0121   NITRITE NEGATIVE 08/19/2021 0121   LEUKOCYTESUR NEGATIVE 08/19/2021 0121     Sepsis Labs Recent Labs  Lab 10/29/21 1436 11/03/21 1000 11/04/21 1435  WBC 17.4* 22.3* 27.7*   Microbiology  Recent Results (from the past 240 hour(s))  Surgical pcr screen     Status: Abnormal   Collection Time: 10/29/21  2:38 PM   Specimen: Nasal Mucosa; Nasal Swab  Result Value Ref Range Status   MRSA, PCR NEGATIVE NEGATIVE Final   Staphylococcus aureus POSITIVE (A) NEGATIVE Final    Comment: (NOTE) The Xpert SA Assay (FDA approved for NASAL specimens in patients 35 years of age and older), is one component of a comprehensive surveillance program. It is not intended to diagnose infection nor to guide or monitor treatment. Performed at [facility], [place] [address] [place], [place] [address]        Inpatient Medications:   Scheduled Meds:  sodium chloride   Intravenous Once   [START ON 11/05/2021] apixaban  5 mg Oral BID   [START ON 11/05/2021] aspirin EC  81 mg Oral Daily   [START ON 11/05/2021] dexamethasone (DECADRON) injection  10 mg Intravenous Once   docusate sodium  100 mg Oral BID   ferrous sulfate  325 mg Oral TID PC   hydroxyurea  500 mg Oral BID WC   [START ON 11/05/2021] loratadine  10 mg Oral Daily   metoprolol tartrate  25 mg Oral BID   pantoprazole  40 mg Oral BID   polyethylene glycol  17 g Oral Daily   rosuvastatin  20 mg Oral Daily   senna  2 tablet Oral QHS   zolpidem  5 mg Oral QHS   Continuous Infusions:  sodium chloride 75 mL/hr at 11/04/21 1737   albumin human      ceFAZolin  (ANCEF) IV 2 g (11/04/21 1823)   methocarbamol (ROBAXIN) IV       Radiological Exams on Admission: DG Pelvis Portable  Result Date: 11/04/2021 CLINICAL DATA:  Postop EXAM: PORTABLE PELVIS 1-2 VIEWS COMPARISON:  10/04/2019 FINDINGS: Previous right hip replacement with normal alignment. Interval left hip replacement with intact hardware and normal alignment. Gas within the left hip and thigh soft tissues consistent with recent surgery. Vascular calcifications IMPRESSION: Status post left hip replacement with expected postsurgical change Electronically Signed   By: Donavan Foil M.D.   On: 11/04/2021 15:10   DG HIP UNILAT WITH PELVIS 2-3 VIEWS LEFT  Result Date: 11/04/2021 CLINICAL DATA:  Left hip replacement. EXAM: DG HIP (WITH OR WITHOUT PELVIS) 2-3V LEFT COMPARISON:  None Available. FINDINGS: Three intraoperative fluoroscopic spot images provided. The total fluoroscopic time is 9 seconds and air Karma of 0.6805 mGy. Status post left hip arthroplasty. IMPRESSION: Intraoperative fluoroscopy for left hip arthroplasty. Electronically Signed   By: Anner Crete M.D.   On: 11/04/2021 13:39   DG C-Arm 1-60 Min-No Report  Result Date: 11/04/2021 Fluoroscopy was utilized by the requesting physician.  No radiographic interpretation.   DG C-Arm 1-60 Min-No Report  Result Date: 11/04/2021 Fluoroscopy was utilized by the requesting physician.  No radiographic interpretation.    Impression/Recommendations Principal Problem:   S/P total left hip arthroplasty Active Problems:   Normocytic anemia   History of tobacco use   Atrial fibrillation (HCC)   Essential thrombocythemia (HCC)   S/P CABG x 4   S/P aortic valve replacement with bioprosthetic valve   PAD (peripheral artery disease) (HCC)   DM2 (diabetes mellitus, type 2) (HCC)   Mixed hyperlipidemia   Acute postoperative anemia due to expected blood loss  #1 status post left hip arthroplasty -Per orthopedics.  Noted to have significant  postop blood loss requiring transfusion of 2 units packed  red blood cells as well as 3 g of Tranexamic acid. -PT/OT. -Per orthopedics.  2.  Postop acute blood loss anemia/normocytic anemia -Patient noted to have significant blood loss perioperatively requiring 2 units of packed red blood cells. -Postoperative hemoglobin at 9.7.  Noted to have been at 11.8 on admission and baseline hemoglobin seems to range in the eights. -Repeat CBC now and in the AM and transfuse for hemoglobin < 8.  -Hold Eliquis for now. -Resume oral iron supplementation. -We will inform patient's primary hematologist of admission.  3.??  Volume overload -Concern for transfusion induced volume overload as patient noted to have received 2 units packed red blood cells perioperatively. -Patient examination with 1+ bilateral lower extremity edema, no crackles noted on examination, patient not interested acute respiratory distress and not short of breath. -Blood pressure borderline. -Repeat blood pressure and if BP able to tolerate we will give a dose of Lasix 20 mg IV x1 otherwise will monitor for now.  4.  History of atrial fibrillation -Patient currently in A-fib on examination with heart rate irregularly irregular. -Patient currently asymptomatic. -Place on telemetry, check a EKG. -Resume home regimen beta-blocker for rate control. -Patient to be started on Eliquis tomorrow if hemoglobin remains stable.  5.  History of diabetes mellitus type 2 -Patient states not on any medications at this time. -Check a hemoglobin A1c and if elevated will place on sliding scale insulin.  6.  History of CAD/status post CABG/status post AVR with bioprosthetic valve -Currently stable. -Resume home regimen beta-blocker, statin.  7.  Essential thrombocythemia -Agree with continuation of home regimen Hydrea. -Patient noted to have received IV Venofer fair 10/27/2021. -Patient noted to be on Retacrit 40,000 units q. weekly which was  started on 10/14/2021. -Will inform oncology of admission via epic. -Resume home regimen oral iron supplementation.  8.  Chronic leukocytosis -Stable. -Patient with no signs or symptoms of infection.  Afebrile. -Outpatient follow-up with primary hematology/oncology.  9.  Hyperlipidemia -Resume home regimen statin.     Thank you for this consultation.  Our [facility] hospitalist team will follow the patient with you.   Time Spent: 6 minutes  Irine Seal M.D. [facility] 11/04/2021, 6:39 PM

## 2021-11-04 NOTE — Transfer of Care (Addendum)
Immediate Anesthesia Transfer of Care Note  Patient: Justin [name]  Procedure(s) Performed: TOTAL HIP ARTHROPLASTY ANTERIOR APPROACH (Left: Hip)  Patient Location: PACU  Anesthesia Type:MAC and Spinal  Level of Consciousness: awake, alert  and patient cooperative  Airway & Oxygen Therapy: Patient Spontanous Breathing and Patient connected to face mask oxygen  Post-op Assessment: Report given to RN and Post -op Vital signs reviewed and stable  Post vital signs: Reviewed and stable  Last Vitals:  Vitals Value Taken Time  BP 119/73 11/04/21 1400  Temp    Pulse 81 11/04/21 1401  Resp 19 11/04/21 1404  SpO2 98 % 11/04/21 1401  Vitals shown include unvalidated device data.  Last Pain:  Vitals:   11/04/21 0941  TempSrc:   PainSc: 0-No pain      Patients Stated Pain Goal: 4 (92/01/00 7121)  Complications: No notable events documented.

## 2021-11-04 NOTE — Plan of Care (Signed)
  Problem: Activity: Goal: Risk for activity intolerance will decrease Outcome: Progressing   Problem: Pain Management: Goal: Pain level will decrease with appropriate interventions Outcome: Progressing   

## 2021-11-04 NOTE — Anesthesia Postprocedure Evaluation (Signed)
Anesthesia Post Note  Patient: Justin [name]  Procedure(s) Performed: TOTAL HIP ARTHROPLASTY ANTERIOR APPROACH (Left: Hip)     Patient location during evaluation: PACU Anesthesia Type: Spinal Level of consciousness: awake and alert Pain management: pain level controlled Vital Signs Assessment: post-procedure vital signs reviewed and stable Respiratory status: spontaneous breathing, nonlabored ventilation and respiratory function stable Cardiovascular status: blood pressure returned to baseline Postop Assessment: no apparent nausea or vomiting, spinal receding, no headache and no backache Anesthetic complications: no Comments: Pt maintaining MAP>65 off phenylephrine for over one hour. Awake, alert, has no complaints. CBC drawn in PACU with Hgb 9.7 (s/p 2u pRBCs in OR). Daiva Huge, MD   No notable events documented.  Last Vitals:  Vitals:   11/04/21 1605 11/04/21 1615  BP: (!) 87/67 100/62  Pulse:    Resp: 19 (!) 21  Temp:    SpO2:      Last Pain:  Vitals:   11/04/21 1600  TempSrc:   PainSc: 0-No pain                 Marthenia Rolling

## 2021-11-04 NOTE — Anesthesia Procedure Notes (Addendum)
Procedure Name: MAC Date/Time: 11/04/2021 11:58 AM  Performed by: West Pugh, CRNAPre-anesthesia Checklist: Patient identified, Emergency Drugs available, Suction available, Patient being monitored and Timeout performed Patient Re-evaluated:Patient Re-evaluated prior to induction Oxygen Delivery Method: Simple face mask Preoxygenation: Pre-oxygenation with 100% oxygen Placement Confirmation: positive ETCO2

## 2021-11-04 NOTE — Discharge Instructions (Signed)

## 2021-11-04 NOTE — H&P (Signed)
TOTAL HIP ADMISSION H&P  Patient is admitted for left total hip arthroplasty.  Subjective:  Chief Complaint: left hip pain  HPI: Justin [name], 66 y.o. male, has a history of pain and functional disability in the left hip(s) due to arthritis and patient has failed non-surgical conservative treatments for greater than 12 weeks to include use of assistive devices and activity modification.  Onset of symptoms was gradual starting 2 years ago with gradually worsening course since that time.The patient noted no past surgery on the left hip(s).  Patient currently rates pain in the left hip at 8 out of 10 with activity. Patient has worsening of pain with activity and weight bearing, pain that interfers with activities of daily living, and pain with passive range of motion. Patient has evidence of joint space narrowing by imaging studies. This condition presents safety issues increasing the risk of falls.  There is no current active infection.  Patient Active Problem List   Diagnosis Date Noted   Mixed hyperlipidemia 07/08/2020   AKI (acute kidney injury) ([place]) 10/04/2019   Closed displaced trimalleolar fracture of right ankle 10/04/2019   Peritoneal hemorrhage 10/04/2019   DM2 (diabetes mellitus, type 2) ([place]) 10/04/2019   Acute urinary retention 10/04/2019   Fall 10/04/2019   Status post right hip replacement 12/23/2018   PAD (peripheral artery disease) ([place]) 09/20/2018   Melena    Acute gastric ulcer with hemorrhage    Symptomatic anemia 08/10/2018   Acute blood loss anemia 08/10/2018   GI bleed 08/10/2018   Fatty liver    Normocytic anemia 07/19/2018   History of CEA (carotid endarterectomy) 02/09/2018   History of tobacco use 02/02/2018   Carotid stenosis 01/25/2018   S/P CABG x 4 01/03/2018   S/P aortic valve replacement with bioprosthetic valve 01/03/2018   Coronary artery disease 01/03/2018   Essential thrombocythemia ([place]) 12/21/2017   Bicuspid aortic valve determined by  imaging 12/09/2017   Claudication ([place]) 12/09/2017   Sleep apnea 12/09/2017   Left ventricular systolic dysfunction without heart failure 12/09/2017   Aortic stenosis 12/01/2017   Erythrocytosis 11/19/2017   Leukocytosis 11/19/2017   Atrial fibrillation ([place]) 11/12/2017   Essential hypertension 10/19/2013   Pulmonary infiltrates 09/27/2013   Cough 09/27/2013   Past Medical History:  Diagnosis Date   Acute meniscal tear of knee LEFT   Anemia    Aortic stenosis 12/01/2017   NONRHEUMATIC, AORTIC VALVE CALCIFICATIONS, MILD TO MODERATE REGURG, MILD TO MODERATE CALCIFIED ANNULUS per ECHO 10/25/17 @ [facility] [address]   Arthritis    Atrial fibrillation ([place]) 11/12/2017   AT O/V WITH PCP   Cancer ([place])    skin right arm   Coronary artery disease    Dyspnea    GERD (gastroesophageal reflux disease)    Headache    hx of migraines   Heart murmur MILD-- ASYMPTOMATIC   History of kidney stones    Hyperlipidemia    Hypertension    Left knee pain    PAD (peripheral artery disease) ([place])    left leg claudication   Sleep apnea     Past Surgical History:  Procedure Laterality Date   AORTIC VALVE REPLACEMENT N/A 01/03/2018   Procedure: AORTIC VALVE REPLACEMENT (AVR) using 43m Magna Ease Bioprosthesis Aortic Valve;  Surgeon: VIvin Poot MD;  [facility]  Service: Open Heart Surgery;  Laterality: N/A;   APPENDECTOMY  1998   BIOPSY  08/11/2018   Procedure: BIOPSY;  Surgeon: NMauri Pole MD;  [facility]: [facility]  Service: Endoscopy;;   BIOPSY  08/20/2021   Procedure: BIOPSY;  Surgeon: Rush Landmark Telford Nab., MD;  Location: [place];  Service: Gastroenterology;;   CATARACT EXTRACTION W/ INTRAOCULAR LENS  IMPLANT, BILATERAL  1998/  2000   CERVICAL FUSION  1985   C4 - 5   COLONOSCOPY N/A 08/21/2021   Procedure: COLONOSCOPY;  Surgeon: [name], MD;  Location: [place];  Service: Gastroenterology;  Laterality: N/A;   CORONARY ARTERY BYPASS GRAFT N/A  01/03/2018   Procedure: CORONARY ARTERY BYPASS GRAFTING (CABG) x 4 (LIMA to LAD, SVG to DIAGONAL, SVG to RAMUS INTERMEDIATE, and SVG to PDA), USING LEFT INFTERNAL MAMMARY ARTERY AND;  Surgeon: Prescott Gum, Collier Salina, MD;  Location: [place];  Service: Open Heart Surgery;  Laterality: N/A;   ENDARTERECTOMY Right 01/03/2018   Procedure: ENDARTERECTOMY CAROTID;  Surgeon: Marty Heck, MD;  Location: [facility];  Service: Vascular;  Laterality: Right;   ESOPHAGOGASTRODUODENOSCOPY (EGD) WITH PROPOFOL N/A 08/11/2018   Procedure: ESOPHAGOGASTRODUODENOSCOPY (EGD) WITH PROPOFOL;  Surgeon: Mauri Pole, MD;  Location: [place];  Service: Endoscopy;  Laterality: N/A;   ESOPHAGOGASTRODUODENOSCOPY (EGD) WITH PROPOFOL N/A 08/20/2021   Procedure: ESOPHAGOGASTRODUODENOSCOPY (EGD) WITH PROPOFOL;  Surgeon: Rush Landmark Telford Nab., MD;  Location: [place];  Service: Gastroenterology;  Laterality: N/A;   HOT HEMOSTASIS N/A 08/20/2021   Procedure: HOT HEMOSTASIS (ARGON PLASMA COAGULATION/BICAP);  Surgeon: Irving Copas., MD;  Location: [place];  Service: Gastroenterology;  Laterality: N/A;   HOT HEMOSTASIS N/A 08/21/2021   Procedure: HOT HEMOSTASIS (ARGON PLASMA COAGULATION/BICAP);  Surgeon: [name], MD;  Location: [place];  Service: Gastroenterology;  Laterality: N/A;   KNEE ARTHROSCOPY W/ MENISCECTOMY  1991  1987   LEFT KNEE   x 2   NASAL SINUS SURGERY  1982   ORIF ANKLE FRACTURE Right 10/07/2019   Procedure: OPEN REDUCTION INTERNAL FIXATION (ORIF) ANKLE FRACTURE;  Surgeon: Hiram Gash, MD;  Location: [place];  Service: Orthopedics;  Laterality: Right;  Regional RIGHT  ankle block as well.   RIGHT/LEFT HEART CATH AND CORONARY ANGIOGRAPHY N/A 12/24/2017   Procedure: RIGHT/LEFT HEART CATH AND CORONARY ANGIOGRAPHY;  Surgeon: Jolaine Artist, MD;  Location: [place] [facility];  Service: Cardiovascular;  Laterality: N/A;   ROTATOR CUFF REPAIR  09-04-2005   LEFT SHOULDER   TEE WITHOUT  CARDIOVERSION N/A 12/24/2017   Procedure: TRANSESOPHAGEAL ECHOCARDIOGRAM (TEE);  Surgeon: Jolaine Artist, MD;  Location: [facility] ENDOSCOPY;  Service: Cardiovascular;  Laterality: N/A;   TEE WITHOUT CARDIOVERSION N/A 01/03/2018   Procedure: TRANSESOPHAGEAL ECHOCARDIOGRAM (TEE);  Surgeon: Prescott Gum, Collier Salina, MD;  Location: [place];  Service: Open Heart Surgery;  Laterality: N/A;   TONSILLECTOMY  AGE 64   TOTAL HIP ARTHROPLASTY Right 12/23/2018   Procedure: TOTAL HIP ARTHROPLASTY ANTERIOR APPROACH;  Surgeon: Paralee Cancel, MD;  Location: [facility];  Service: Orthopedics;  Laterality: Right;  70 mins    No current facility-administered medications for this encounter.   Current Outpatient Medications  Medication Sig Dispense Refill Last Dose   albuterol (VENTOLIN HFA) 108 (90 Base) MCG/ACT inhaler Inhale 1 puff into the lungs every 6 (six) hours as needed for shortness of breath.      aspirin EC 81 MG tablet Take 81 mg by mouth daily. Swallow whole.      Carboxymethylcellulose Sodium (REFRESH TEARS OP) Place 1-2 drops into both eyes daily as needed (dry eyes).      Cholecalciferol 125 MCG (5000 UT) capsule Take 5,000 Units by mouth daily.  cyclobenzaprine (FLEXERIL) 10 MG tablet Take 10 mg by mouth in the morning and at bedtime.      ELIQUIS 5 MG TABS tablet Take 1 tablet (5 mg total) by mouth 2 (two) times daily. 60 tablet     ferrous sulfate 325 (65 FE) MG tablet Take 1 tablet (325 mg total) by mouth 2 (two) times daily with a meal. 60 tablet 0    furosemide (LASIX) 20 MG tablet Take 20 mg by mouth daily as needed for fluid.      HYDROcodone-acetaminophen (NORCO) 7.5-325 MG tablet Take 1-2 tablets by mouth every 4 (four) hours as needed for moderate pain.       hydroxyurea (HYDREA) 500 MG capsule TAKE ONE CAPSULE BY MOUTH TWICE A DAY *MAY TAKE WITH FOOD TO MINIMIZE GI SIDE EFFECTS 180 capsule 0    Lidocaine (BLUE-EMU PAIN RELIEF DRY EX) Apply 1 application topically daily as needed (pain).       loratadine (CLARITIN) 10 MG tablet Take 10 mg by mouth daily.      metoprolol tartrate (LOPRESSOR) 25 MG tablet Take 25 mg by mouth 2 (two) times daily.      Multiple Vitamins-Minerals (CENTRUM ADULTS) TABS Take 1 capsule by mouth daily.      Omega-3 Fatty Acids (FISH OIL PO) Take 1 capsule by mouth daily.      pantoprazole (PROTONIX) 40 MG tablet Take 1 tablet (40 mg total) by mouth 2 (two) times daily. Twice daily x4 weeks-then daily. 90 tablet 3    Potassium Chloride ER 20 MEQ TBCR Take 20 mEq by mouth daily as needed (take when taking lasix).      rosuvastatin (CRESTOR) 20 MG tablet TAKE 1 TABLET BY MOUTH EVERY DAY IN THE EVENING (Patient taking differently: Take 20 mg by mouth daily.) 90 tablet 3    zolpidem (AMBIEN) 10 MG tablet Take 10 mg by mouth at bedtime.       docusate sodium (COLACE) 100 MG capsule Take 1 capsule (100 mg total) by mouth 2 (two) times daily. (Patient not taking: Reported on 10/24/2021) 10 capsule 0 Not Taking   senna-docusate (SENOKOT-S) 8.6-50 MG tablet Take 1 tablet by mouth 2 (two) times daily. (Patient not taking: Reported on 08/18/2021)      sucralfate (CARAFATE) 1 g tablet Take 1 tablet (1 g total) by mouth 2 (two) times daily. (Patient not taking: Reported on 10/24/2021) 60 tablet 0 Not Taking   tamsulosin (FLOMAX) 0.4 MG CAPS capsule Take 1 capsule (0.4 mg total) by mouth daily. (Patient not taking: Reported on 10/24/2021) 30 capsule  Not Taking   Allergies  Allergen Reactions   Codeine Swelling and Other (See Comments)    Tolerates hydrocodone, tramadol, and oxycodone    Dilaudid [Hydromorphone] Anxiety    Hallucinations    Doxycycline Itching and Rash    Social History   Tobacco Use   Smoking status: Former    Packs/day: 0.25    Years: 23.00    Total pack years: 5.75    Types: Cigars, Cigarettes    Quit date: 01/02/2018    Years since quitting: 3.8   Smokeless tobacco: Never   Tobacco comments:    quit cigs in 2011 and smokes cigars daily- from 3-10  cigars-09/27/13  Substance Use Topics   Alcohol use: Not Currently    Alcohol/week: 2.0 standard drinks of alcohol    Types: 2 Glasses of wine per week    Comment: occasional    Family History  Adopted: Yes  Problem Relation Age of Onset   Bladder Cancer Father        adopted father     Review of Systems  Constitutional:  Negative for chills and fever.  Respiratory:  Negative for cough and shortness of breath.   Cardiovascular:  Negative for chest pain.  Gastrointestinal:  Negative for nausea and vomiting.  Musculoskeletal:  Positive for arthralgias.     Objective:  Physical Exam Well nourished and well developed. General: Alert and oriented x3, cooperative and pleasant, no acute distress. Head: normocephalic, atraumatic, neck supple. Eyes: EOMI.  Musculoskeletal: Left hip exam: Painful and limited hip flexion internal and external rotation Active hip flexion with slight external rotation contracture over 120 degrees Right hip exam reveals a fluid range of motion without reproducible groin pain  Calves soft and nontender. Motor function intact in LE. Strength 5/5 LE bilaterally. Neuro: Distal pulses 2+. Sensation to light touch intact in LE.  Vital signs in last 24 hours: Temp:  [97.5 F (36.4 C)] 97.5 F (36.4 C) (10/16 1034) Pulse Rate:  [102] 102 (10/16 1034) Resp:  [20] 20 (10/16 1034) BP: (147)/(85) 147/85 (10/16 1034) SpO2:  [94 %] 94 % (10/16 1034) Weight:  [75.8 kg] 75.8 kg (10/16 1034)  Labs:   Estimated body mass index is 26.95 kg/m as calculated from the following:   Height as of 11/03/21: '5\' 6"'$  (1.676 m).   Weight as of 11/03/21: 75.8 kg.   Imaging Review Plain radiographs demonstrate severe degenerative joint disease of the left hip(s). The bone quality appears to be adequate for age and reported activity level.      Assessment/Plan:  End stage arthritis, left hip(s)  The patient history, physical examination, clinical judgement of the  provider and imaging studies are consistent with end stage degenerative joint disease of the left hip(s) and total hip arthroplasty is deemed medically necessary. The treatment options including medical management, injection therapy, arthroscopy and arthroplasty were discussed at length. The risks and benefits of total hip arthroplasty were presented and reviewed. The risks due to aseptic loosening, infection, stiffness, dislocation/subluxation,  thromboembolic complications and other imponderables were discussed.  The patient acknowledged the explanation, agreed to proceed with the plan and consent was signed. Patient is being admitted for inpatient treatment for surgery, pain control, PT, OT, prophylactic antibiotics, VTE prophylaxis, progressive ambulation and ADL's and discharge planning.The patient is planning to be discharged  home.  Therapy Plans: HEP Disposition: Home with wife Planned DVT Prophylaxis: Eliquis 5 BID DME needed: none PCP: Dr. Randye Lobo - was not cleared prior to eval by heme - will discuss new clearance Heme/Onc: Dr. Marin Olp, clearance received (hx of leukemia) Cardio: Dr. Harrell Gave TXA: IV Allergies: codine - Anesthesia Concerns: none = general BMI: 27.3 Last HgbA1c: Not diabetic   Other: - Plt count is 191 - likely general anesth. - Iron infusion early next week - Most recent Hgb is 7.8 - Has leukemia - Norco, robaxin, dilaudid IV   Costella Hatcher, PA-C Orthopedic Surgery [facility] [place] [phone]

## 2021-11-05 ENCOUNTER — Encounter (HOSPITAL_COMMUNITY): Payer: Self-pay | Admitting: Orthopedic Surgery

## 2021-11-05 ENCOUNTER — Observation Stay (HOSPITAL_COMMUNITY): Payer: Medicare Other

## 2021-11-05 DIAGNOSIS — D62 Acute posthemorrhagic anemia: Secondary | ICD-10-CM | POA: Diagnosis not present

## 2021-11-05 DIAGNOSIS — I482 Chronic atrial fibrillation, unspecified: Secondary | ICD-10-CM | POA: Diagnosis present

## 2021-11-05 DIAGNOSIS — E1169 Type 2 diabetes mellitus with other specified complication: Secondary | ICD-10-CM | POA: Diagnosis present

## 2021-11-05 DIAGNOSIS — N179 Acute kidney failure, unspecified: Secondary | ICD-10-CM | POA: Diagnosis not present

## 2021-11-05 DIAGNOSIS — E119 Type 2 diabetes mellitus without complications: Secondary | ICD-10-CM

## 2021-11-05 DIAGNOSIS — D689 Coagulation defect, unspecified: Secondary | ICD-10-CM | POA: Diagnosis not present

## 2021-11-05 DIAGNOSIS — K760 Fatty (change of) liver, not elsewhere classified: Secondary | ICD-10-CM | POA: Diagnosis present

## 2021-11-05 DIAGNOSIS — R4182 Altered mental status, unspecified: Secondary | ICD-10-CM | POA: Diagnosis not present

## 2021-11-05 DIAGNOSIS — J811 Chronic pulmonary edema: Secondary | ICD-10-CM | POA: Diagnosis not present

## 2021-11-05 DIAGNOSIS — Z953 Presence of xenogenic heart valve: Secondary | ICD-10-CM

## 2021-11-05 DIAGNOSIS — R339 Retention of urine, unspecified: Secondary | ICD-10-CM | POA: Diagnosis not present

## 2021-11-05 DIAGNOSIS — Y92239 Unspecified place in hospital as the place of occurrence of the external cause: Secondary | ICD-10-CM | POA: Diagnosis not present

## 2021-11-05 DIAGNOSIS — J90 Pleural effusion, not elsewhere classified: Secondary | ICD-10-CM | POA: Diagnosis not present

## 2021-11-05 DIAGNOSIS — I5022 Chronic systolic (congestive) heart failure: Secondary | ICD-10-CM | POA: Diagnosis present

## 2021-11-05 DIAGNOSIS — N189 Chronic kidney disease, unspecified: Secondary | ICD-10-CM | POA: Diagnosis present

## 2021-11-05 DIAGNOSIS — D473 Essential (hemorrhagic) thrombocythemia: Secondary | ICD-10-CM | POA: Diagnosis present

## 2021-11-05 DIAGNOSIS — N138 Other obstructive and reflux uropathy: Secondary | ICD-10-CM | POA: Diagnosis present

## 2021-11-05 DIAGNOSIS — D72823 Leukemoid reaction: Secondary | ICD-10-CM | POA: Diagnosis not present

## 2021-11-05 DIAGNOSIS — E876 Hypokalemia: Secondary | ICD-10-CM | POA: Diagnosis not present

## 2021-11-05 DIAGNOSIS — M1612 Unilateral primary osteoarthritis, left hip: Secondary | ICD-10-CM | POA: Diagnosis present

## 2021-11-05 DIAGNOSIS — Z96642 Presence of left artificial hip joint: Secondary | ICD-10-CM | POA: Diagnosis not present

## 2021-11-05 DIAGNOSIS — R338 Other retention of urine: Secondary | ICD-10-CM | POA: Diagnosis not present

## 2021-11-05 DIAGNOSIS — I5023 Acute on chronic systolic (congestive) heart failure: Secondary | ICD-10-CM | POA: Diagnosis not present

## 2021-11-05 DIAGNOSIS — D471 Chronic myeloproliferative disease: Secondary | ICD-10-CM | POA: Diagnosis present

## 2021-11-05 DIAGNOSIS — R040 Epistaxis: Secondary | ICD-10-CM | POA: Diagnosis not present

## 2021-11-05 DIAGNOSIS — I2510 Atherosclerotic heart disease of native coronary artery without angina pectoris: Secondary | ICD-10-CM

## 2021-11-05 DIAGNOSIS — D649 Anemia, unspecified: Secondary | ICD-10-CM | POA: Diagnosis not present

## 2021-11-05 DIAGNOSIS — G08 Intracranial and intraspinal phlebitis and thrombophlebitis: Secondary | ICD-10-CM | POA: Diagnosis present

## 2021-11-05 DIAGNOSIS — L03116 Cellulitis of left lower limb: Secondary | ICD-10-CM | POA: Diagnosis not present

## 2021-11-05 DIAGNOSIS — F05 Delirium due to known physiological condition: Secondary | ICD-10-CM | POA: Diagnosis not present

## 2021-11-05 DIAGNOSIS — R5381 Other malaise: Secondary | ICD-10-CM | POA: Diagnosis not present

## 2021-11-05 DIAGNOSIS — E1151 Type 2 diabetes mellitus with diabetic peripheral angiopathy without gangrene: Secondary | ICD-10-CM | POA: Diagnosis present

## 2021-11-05 DIAGNOSIS — K625 Hemorrhage of anus and rectum: Secondary | ICD-10-CM | POA: Diagnosis not present

## 2021-11-05 DIAGNOSIS — E782 Mixed hyperlipidemia: Secondary | ICD-10-CM

## 2021-11-05 DIAGNOSIS — L89620 Pressure ulcer of left heel, unstageable: Secondary | ICD-10-CM | POA: Diagnosis present

## 2021-11-05 DIAGNOSIS — I5041 Acute combined systolic (congestive) and diastolic (congestive) heart failure: Secondary | ICD-10-CM | POA: Diagnosis not present

## 2021-11-05 DIAGNOSIS — Z66 Do not resuscitate: Secondary | ICD-10-CM | POA: Diagnosis present

## 2021-11-05 DIAGNOSIS — E440 Moderate protein-calorie malnutrition: Secondary | ICD-10-CM | POA: Diagnosis present

## 2021-11-05 DIAGNOSIS — I130 Hypertensive heart and chronic kidney disease with heart failure and stage 1 through stage 4 chronic kidney disease, or unspecified chronic kidney disease: Secondary | ICD-10-CM | POA: Diagnosis present

## 2021-11-05 DIAGNOSIS — G9341 Metabolic encephalopathy: Secondary | ICD-10-CM | POA: Diagnosis not present

## 2021-11-05 DIAGNOSIS — I480 Paroxysmal atrial fibrillation: Secondary | ICD-10-CM | POA: Diagnosis present

## 2021-11-05 DIAGNOSIS — E1122 Type 2 diabetes mellitus with diabetic chronic kidney disease: Secondary | ICD-10-CM | POA: Diagnosis present

## 2021-11-05 HISTORY — DX: Type 2 diabetes mellitus with other specified complication: E11.69

## 2021-11-05 LAB — CBC
HCT: 19.6 % — ABNORMAL LOW (ref 39.0–52.0)
HCT: 27.6 % — ABNORMAL LOW (ref 39.0–52.0)
Hemoglobin: 6.3 g/dL — CL (ref 13.0–17.0)
Hemoglobin: 9 g/dL — ABNORMAL LOW (ref 13.0–17.0)
MCH: 34.2 pg — ABNORMAL HIGH (ref 26.0–34.0)
MCH: 33.8 pg (ref 26.0–34.0)
MCHC: 32.1 g/dL (ref 30.0–36.0)
MCHC: 32.6 g/dL (ref 30.0–36.0)
MCV: 106.5 fL — ABNORMAL HIGH (ref 80.0–100.0)
MCV: 103.8 fL — ABNORMAL HIGH (ref 80.0–100.0)
Platelets: 172 10*3/uL (ref 150–400)
Platelets: 226 10*3/uL (ref 150–400)
RBC: 1.84 MIL/uL — ABNORMAL LOW (ref 4.22–5.81)
RBC: 2.66 MIL/uL — ABNORMAL LOW (ref 4.22–5.81)
RDW: 26.2 % — ABNORMAL HIGH (ref 11.5–15.5)
RDW: 23.9 % — ABNORMAL HIGH (ref 11.5–15.5)
WBC: 31.7 10*3/uL — ABNORMAL HIGH (ref 4.0–10.5)
WBC: 46.3 10*3/uL — ABNORMAL HIGH (ref 4.0–10.5)
nRBC: 0.7 % — ABNORMAL HIGH (ref 0.0–0.2)
nRBC: 0.5 % — ABNORMAL HIGH (ref 0.0–0.2)

## 2021-11-05 LAB — COMPREHENSIVE METABOLIC PANEL
ALT: 12 U/L (ref 0–44)
AST: 29 U/L (ref 15–41)
Albumin: 2.5 g/dL — ABNORMAL LOW (ref 3.5–5.0)
Alkaline Phosphatase: 61 U/L (ref 38–126)
Anion gap: 8 (ref 5–15)
BUN: 15 mg/dL (ref 8–23)
CO2: 22 mmol/L (ref 22–32)
Calcium: 7.5 mg/dL — ABNORMAL LOW (ref 8.9–10.3)
Chloride: 111 mmol/L (ref 98–111)
Creatinine, Ser: 1.4 mg/dL — ABNORMAL HIGH (ref 0.61–1.24)
GFR, Estimated: 55 mL/min — ABNORMAL LOW (ref >60)
Glucose, Bld: 118 mg/dL — ABNORMAL HIGH (ref 70–99)
Potassium: 3.1 mmol/L — ABNORMAL LOW (ref 3.5–5.1)
Sodium: 141 mmol/L (ref 135–145)
Total Bilirubin: 0.9 mg/dL (ref 0.3–1.2)
Total Protein: 4.9 g/dL — ABNORMAL LOW (ref 6.5–8.1)

## 2021-11-05 LAB — MAGNESIUM: Magnesium: 1.5 mg/dL — ABNORMAL LOW (ref 1.7–2.4)

## 2021-11-05 LAB — HEMOGLOBIN A1C
Hgb A1c MFr Bld: 5 % (ref 4.8–5.6)
Mean Plasma Glucose: 96.8 mg/dL

## 2021-11-05 LAB — PREPARE RBC (CROSSMATCH)

## 2021-11-05 MED ORDER — EPOETIN ALFA 40000 UNIT/ML IJ SOLN
40000.0000 [IU] | Freq: Once | INTRAMUSCULAR | Status: AC
  Administered 2021-11-05: 40000 [IU] via SUBCUTANEOUS
  Filled 2021-11-05: qty 1

## 2021-11-05 MED ORDER — TRANEXAMIC ACID 650 MG PO TABS
1950.0000 mg | ORAL_TABLET | Freq: Two times a day (BID) | ORAL | Status: DC
  Administered 2021-11-05 – 2021-11-07 (×6): 1950 mg via ORAL
  Filled 2021-11-05 (×7): qty 3

## 2021-11-05 MED ORDER — SODIUM CHLORIDE 0.9% IV SOLUTION: Freq: Once | INTRAVENOUS | Status: AC

## 2021-11-05 MED ORDER — SODIUM CHLORIDE 0.9 % IV SOLN
40.0000 mg | Freq: Once | INTRAVENOUS | Status: AC
  Administered 2021-11-05: 40 mg via INTRAVENOUS
  Filled 2021-11-05: qty 4

## 2021-11-05 MED ORDER — APIXABAN 5 MG PO TABS: 5.0000 mg | ORAL_TABLET | Freq: Two times a day (BID) | ORAL | Status: DC

## 2021-11-05 MED ORDER — SODIUM CHLORIDE 0.9 % IV SOLN
510.0000 mg | Freq: Once | INTRAVENOUS | Status: DC
  Filled 2021-11-05: qty 17

## 2021-11-05 MED ORDER — FUROSEMIDE 10 MG/ML IJ SOLN
40.0000 mg | Freq: Once | INTRAMUSCULAR | Status: AC
  Administered 2021-11-05: 40 mg via INTRAVENOUS
  Filled 2021-11-05: qty 4

## 2021-11-05 MED ORDER — MAGNESIUM SULFATE 2 GM/50ML IV SOLN
2.0000 g | Freq: Once | INTRAVENOUS | Status: AC
  Administered 2021-11-05: 2 g via INTRAVENOUS
  Filled 2021-11-05: qty 50

## 2021-11-05 MED ORDER — POTASSIUM CHLORIDE CRYS ER 20 MEQ PO TBCR
40.0000 meq | EXTENDED_RELEASE_TABLET | Freq: Once | ORAL | Status: AC
  Administered 2021-11-05: 40 meq via ORAL
  Filled 2021-11-05: qty 2

## 2021-11-05 NOTE — Progress Notes (Signed)
Made Dr. Inda Merlin, MD aware that patient has not voided since 0630.  Bladder scan: 138 ml. Made Dr. Inda Merlin, MD aware.  New order from Dr. Inda Merlin, MD:  "Bladder scan as needed. Notify provider if bladder scan results greater than 300 ml."  Will continue to monitor patient.

## 2021-11-05 NOTE — Assessment & Plan Note (Signed)
.   Patient been placed on Accu-Cheks before every meal and nightly with sliding scale insulin . Holding home regimen of hypoglycemics . Hemoglobin A1C 5.0% . Diabetic Diet

## 2021-11-05 NOTE — Assessment & Plan Note (Signed)
.   Continuing home regimen of lipid lowering therapy.  

## 2021-11-05 NOTE — Significant Event (Signed)
Rapid Response Event Note   Reason for Call :  Increased Oxygen demand   Initial Focused Assessment:  Notified by bedside RN that patients Spo2 was reading 88% on 2L, RN also stated patients pulse/ Heart rate was very variable 25-130"s.   Patient just finished 1st of 2 PRBC, and has IV iron ordered. Patient did appear pale, breathing with ease, pulses +1 in all four extremities.    Interventions:  Hooked patient up to rapid monitor, Sp02 99%, HR low 100's, BP 116/63.   Changed out patients bedside monitor, and ensured it was correlating to rapid cart.     Plan of Care:  No new interventions needed for POC, continue with current orders.    Event Summary:  Patient stable and vitals WDL, please reach back out to Rapid response with further concerns  Call Time: [place] Time: 1010 End Time: [place], RN

## 2021-11-05 NOTE — Progress Notes (Signed)
PT Cancellation Note  Patient Details Name: Justin [name] MRN: [id number] DOB: [birth year]   Cancelled Treatment:    Reason Eval/Treat Not Completed: Patient declined, no reason specified Pt receiving PRBCs in recliner and declined PT at this time.  Repositioned pt in recliner and brought tray table closer for pt to reach.  Pt reports he would like to mobilize more tomorrow and agreeable to call for assist when ready to return to bed.   [name] 11/05/2021, 3:50 PM Jannette Spanner PT, DPT Physical Therapist Acute Rehabilitation Services Preferred contact method: Secure Chat Weekend Pager Only: [phone] Office: [phone]

## 2021-11-05 NOTE — Assessment & Plan Note (Signed)
   Continuing metoprolol, statin therapy  Patient is currently chest pain-free  Monitoring patient on telemetry

## 2021-11-05 NOTE — Progress Notes (Signed)
During shift assessment, patient's 02 sat was dropping to between 82 and 88% on 2L Nasal canula. Increased the 02 to 3L. Patient alert and oriented and not in any respiratory distress. Extremities cool. Notified Ethan Pollet, Rapid Response RN to come take a look at the patient. Patient's heart rate dropped to the 30's. Notified Danna, Agricultural consultant at 10:03 AM to make aware of the rapid response call. Danna, Charge came to room to assess. Nurses changed out the continuous pulse ox as well as the pulse ox probe. Compared readings from dynamap and continuous pulse ox. Patient's 02 sat WNL once the continuous pulse ox and probe was changed out. Made Dr. Inda Merlin, MD aware of what was going on and he came to assess patient at the bedside. New orders: Chest x-ray, IV Lasix, and hold second transfusion and IV Ferumoxytol until chest x-ray results come back.  Will continue to monitor patient.

## 2021-11-05 NOTE — Assessment & Plan Note (Addendum)
   Improved rate control  Continue home regimen of metoprolol as blood pressure tolerates  Monitoring patient on telemetry  Was on heparin for dural venous sinus thrombosis, transitioned to Lovenox and subsequently transitioned to Eliquis (11/15/2021).

## 2021-11-05 NOTE — Hospital Course (Addendum)
Delmar Arriaga is an 66 y.o. male with history of essential thrombocythemia, history of functional disability to the left hip secondary to end-stage arthritis, failed outpatient nonsurgical conservative treatment greater than 12 weeks ago was admitted to the orthopedic service for left total hip arthroplasty.    Patient noted to have had significant blood loss in the perioperative period and received 2 units packed transfusions perioperatively on 10/17.  Patient initiated on tranexamic acid by orthopedic surgery, admitted to the hospital with hospitalist group consulted for assistance with suspected pulmonary edema in addition to assistance with management of patient's atrial fibrillation, diabetes, coronary artery disease.    Patient continued to exhibit substantial anemia on 10/18 requiring an additional 2 units of packed red blood cells transfused.  Hospitalization 53/74 was complicated by worsening leukocytosis and the development of encephalopathy concerning for an underlying infectious process.  Patient was placed on empiric antibiotic therapy.  Hospital course also complicated by acute kidney injury.

## 2021-11-05 NOTE — Assessment & Plan Note (Addendum)
   No significant thrombocytosis at this time  Dr. Marin Olp with hematology oncology following, his input is appreciated.  Patient initiated on hydroxyurea 10/24 and dose being adjusted.

## 2021-11-05 NOTE — Assessment & Plan Note (Addendum)
   Magnesium repleted and currently at 2.1.    Repeat labs in the AM.

## 2021-11-05 NOTE — Plan of Care (Signed)
  Problem: Education: Goal: Knowledge of General Education information will improve Description Including pain rating scale, medication(s)/side effects and non-pharmacologic comfort measures Outcome: Progressing   

## 2021-11-05 NOTE — Progress Notes (Signed)
Subjective: 1 Day Post-Op Procedure(s) (LRB): TOTAL HIP ARTHROPLASTY ANTERIOR APPROACH (Left) Patient reports pain as mild.   Patient seen in rounds by Dr. Alvan Dame. Patient is resting in bed on exam this morning. He has not been up with PT.  We will start therapy today.   Objective: Vital signs in last 24 hours: Temp:  [97.3 F (36.3 C)-98.4 F (36.9 C)] 98 F (36.7 C) (10/18 0634) Pulse Rate:  [34-110] 99 (10/18 0601) Resp:  [12-22] 18 (10/18 0634) BP: (81-137)/(52-90) 108/63 (10/18 0634) SpO2:  [87 %-99 %] 95 % (10/18 0634) Weight:  [75.8 kg] 75.8 kg (10/17 0941)  Intake/Output from previous day:  Intake/Output Summary (Last 24 hours) at 11/05/2021 0805 Last data filed at 11/05/2021 0601 Gross per 24 hour  Intake 3208.75 ml  Output 3000 ml  Net 208.75 ml     Intake/Output this shift: No intake/output data recorded.  Labs: Recent Labs    11/03/21 1000 11/04/21 1435 11/04/21 1957 11/05/21 0347  HGB 11.8* 9.7* 7.2* 6.3*   Recent Labs    11/04/21 1957 11/05/21 0347  WBC 26.4* 31.7*  RBC 2.08* 1.84*  HCT 22.4* 19.6*  PLT 154 172   Recent Labs    11/03/21 1000 11/05/21 0347  NA 144 141  K 4.0 3.1*  CL 108 111  CO2 25 22  BUN 14 15  CREATININE 1.27* 1.40*  GLUCOSE 126* 118*  CALCIUM 9.8 7.5*   No results for input(s): "LABPT", "INR" in the last 72 hours.  Exam: General - Patient is Alert and Oriented Extremity - Neurologically intact Sensation intact distally Intact pulses distally Dorsiflexion/Plantar flexion intact Dressing - dressing C/D/I Motor Function - intact, moving foot and toes well on exam.   Past Medical History:  Diagnosis Date   Acute meniscal tear of knee LEFT   Anemia    Aortic stenosis 12/01/2017   NONRHEUMATIC, AORTIC VALVE CALCIFICATIONS, MILD TO MODERATE REGURG, MILD TO MODERATE CALCIFIED ANNULUS per ECHO 10/25/17 @ [facility] [address]   Arthritis    Atrial fibrillation ([place]) 11/12/2017   AT O/V WITH PCP   Cancer ([place])     skin right arm   Coronary artery disease    Dyspnea    GERD (gastroesophageal reflux disease)    Headache    hx of migraines   Heart murmur MILD-- ASYMPTOMATIC   History of kidney stones    Hyperlipidemia    Hypertension    Left knee pain    PAD (peripheral artery disease) ([facility])    left leg claudication   Sleep apnea     Assessment/Plan: 1 Day Post-Op Procedure(s) (LRB): TOTAL HIP ARTHROPLASTY ANTERIOR APPROACH (Left) Principal Problem:   S/P total left hip arthroplasty Active Problems:   Atrial fibrillation ([facility])   Essential thrombocythemia ([facility])   S/P CABG x 4   S/P aortic valve replacement with bioprosthetic valve   History of tobacco use   Normocytic anemia   PAD (peripheral artery disease) ([facility])   DM2 (diabetes mellitus, type 2) ([facility])   Mixed hyperlipidemia   Acute postoperative anemia due to expected blood loss  Estimated body mass index is 26.95 kg/m as calculated from the following:   Height as of this encounter: '5\' 6"'$  (1.676 m).   Weight as of this encounter: 75.8 kg. Advance diet Up with therapy  DVT Prophylaxis -  Eliquis - We will hold today, plan to resume tomorrow if Hgb stable Weight bearing as tolerated.  ABLA on chronic anemia: Hgb 6.3 this AM  after 2 units yesterday, will transfuse 2 additional units today. Under care of Dr. Marin Olp, appreciate his consultation and assistance. Will give oral TXA BID.   CKD: Cr. 1.4 - baseline ~1.3  We very much appreciate Dr. Biagio Borg assistance in caring for this patient.  Will plan to get up with PT today as able.   Griffith Citron, PA-C Orthopedic Surgery [phone] 11/05/2021, 8:05 AM

## 2021-11-05 NOTE — Progress Notes (Signed)
Critical lab value received for patient. Hemoglobin 6.3. Ordered 2 units PRBCs.

## 2021-11-05 NOTE — Evaluation (Signed)
Physical Therapy Evaluation Patient Details Name: Justin [name] MRN: [id number] DOB: 12/14/1955 Today's Date: 11/05/2021  History of Present Illness  66 y.o. male s/p Left THA on 11/04/21.  PMHx: A.Fib on Eliquis, CAD s/p CABG, AS s/p AVR with bioprosthetic, s/p CEA, DM2, HTN, essential thrombocytosis/JAK2+ chronic leukocytosis  Clinical Impression  Pt is s/p THA resulting in the deficits listed below (see PT Problem List).  Pt will benefit from skilled PT to increase their independence and safety with mobility to allow discharge to the venue listed below.  Pt on BSC on arrival and assisted over to recliner.   Pt declined ambulating at this time.  Pt just finished with his report of 2nd unit of PRBCs.  Pt also mildly dizzy with transfer however improved with sitting in recliner.  Pt plans to return home with spouse upon d/c.        Recommendations for follow up therapy are one component of a multi-disciplinary discharge planning process, led by the attending physician.  Recommendations may be updated based on patient status, additional functional criteria and insurance authorization.  Follow Up Recommendations Follow physician's recommendations for discharge plan and follow up therapies      Assistance Recommended at Discharge PRN  Patient can return home with the following  Help with stairs or ramp for entrance    Equipment Recommendations Rolling walker (2 wheels)  Recommendations for Other Services       Functional Status Assessment Patient has had a recent decline in their functional status and demonstrates the ability to make significant improvements in function in a reasonable and predictable amount of time.     Precautions / Restrictions Precautions Precautions: Fall Restrictions Weight Bearing Restrictions: No      Mobility  Bed Mobility               General bed mobility comments: pt on BSC on arrival    Transfers Overall transfer level: Needs  assistance Equipment used: Rolling walker (2 wheels) Transfers: Sit to/from Stand, Bed to chair/wheelchair/BSC Sit to Stand: Min assist   Step pivot transfers: Min assist       General transfer comment: verbal cues for safe technique; pt reports mild dizziness, SPO2 reading 86% on continous pulse ox and 89% on dynamap so reapplied 2L O2 [place] and notified RN    Ambulation/Gait               General Gait Details: declined first time  Stairs            Wheelchair Mobility    Modified Rankin (Stroke Patients Only)       Balance                                             Pertinent Vitals/Pain Pain Assessment Pain Assessment: 0-10 Pain Score: 6  Pain Location: left hip Pain Descriptors / Indicators: Aching, Sore Pain Intervention(s): Repositioned, Monitored during session (RN aware)    Home Living Family/patient expects to be discharged to:: Private residence Living Arrangements: Spouse/significant other   Type of Home: House Home Access: Stairs to enter Entrance Stairs-Rails: None Entrance Stairs-Number of Steps: 1   Home Layout: One level Home Equipment: None      Prior Function Prior Level of Function : Independent/Modified Independent  Hand Dominance        Extremity/Trunk Assessment        Lower Extremity Assessment Lower Extremity Assessment: LLE deficits/detail LLE Deficits / Details: anticipated post op hip weakness observed       Communication   Communication: No difficulties  Cognition Arousal/Alertness: Awake/alert Behavior During Therapy: WFL for tasks assessed/performed Overall Cognitive Status: Within Functional Limits for tasks assessed                                          General Comments      Exercises     Assessment/Plan    PT Assessment Patient needs continued PT services  PT Problem List Decreased strength;Decreased balance;Decreased  mobility;Decreased activity tolerance;Pain;Decreased knowledge of use of DME       PT Treatment Interventions Stair training;Gait training;Therapeutic exercise;Functional mobility training;Therapeutic activities;Patient/family education;DME instruction    PT Goals (Current goals can be found in the Care Plan section)  Acute Rehab PT Goals PT Goal Formulation: With patient Time For Goal Achievement: 11/12/21 Potential to Achieve Goals: Good    Frequency 7X/week     Co-evaluation               AM-PAC PT "6 Clicks" Mobility  Outcome Measure Help needed turning from your back to your side while in a flat bed without using bedrails?: A Little Help needed moving from lying on your back to sitting on the side of a flat bed without using bedrails?: A Little Help needed moving to and from a bed to a chair (including a wheelchair)?: A Little Help needed standing up from a chair using your arms (e.g., wheelchair or bedside chair)?: A Little Help needed to walk in hospital room?: A Lot Help needed climbing 3-5 steps with a railing? : A Lot 6 Click Score: 16    End of Session Equipment Utilized During Treatment: Oxygen Activity Tolerance: Patient tolerated treatment well Patient left: in chair;with call bell/phone within reach;with nursing/sitter in room Nurse Communication: Mobility status PT Visit Diagnosis: Other abnormalities of gait and mobility (R26.89)    Time: 5400-8676 PT Time Calculation (min) (ACUTE ONLY): 15 min   Charges:   PT Evaluation $PT Eval Low Complexity: 1 Low        Kati PT, DPT Physical Therapist Acute Rehabilitation Services Preferred contact method: Secure Chat Weekend Pager Only: [phone] Office: [place] 11/05/2021, 12:58 PM

## 2021-11-05 NOTE — Progress Notes (Signed)
2242: Pt BP was 88/61,upon assessment, pt c/o feeling lightheaded and dizzy. On call provider notified, verbal order received for 500 mL NS bolus. Rechecked pt BP at 2240 and it 109/61.

## 2021-11-05 NOTE — Assessment & Plan Note (Addendum)
   Status post left hip arthroplasty 10/17 with Dr. Alvan Dame  Per orthopedic surgery, patient to be weightbearing as tolerated at this point  PT, OT evaluations  Limited use of opiate-based analgesics due to ongoing delirium  Patient noted with some blotchiness/duskiness left upper thigh however noted to have left leg warm and some tenderness to palpation.  Will defer to primary team, orthopedics,??  CT imaging of left thigh??  Per primary team.

## 2021-11-05 NOTE — Progress Notes (Signed)
Progress Note   Patient: Justin [name] KDX:[id number] DOB: [birth year] DOA: 11/04/2021     0 DOS: the patient was seen and examined on 11/05/2021   Brief hospital course: Lucion Dilger is an 66 y.o. male with history of essential thrombocythemia, history of functional disability to the left hip secondary to end-stage arthritis, failed outpatient nonsurgical conservative treatment greater than 12 weeks ago was admitted to the orthopedic service for left total hip arthroplasty.    Patient noted to have had significant blood loss in the perioperative period and received 2 units packed transfusions perioperatively on 10/17.  Patient initiated on tranexamic acid by orthopedic surgery, admitted to the hospital with hospitalist group consulted for assistance with suspected pulmonary edema in addition to assistance with management of patient's atrial fibrillation, diabetes, coronary artery disease.    Assessment and Plan: * Acute anemia Hemoglobin this morning showed continued downward trend to 6.3, down from 9.7 yesterday evening at 11.8 on arrival. Clinically there is no obvious evidence of bleeding.  Bilirubin is unremarkable. MCV suggests a macrocytic anemia however vitamin B12 and folate from 08/2021 are unremarkable. Patient to receive 2 additional units of packed red blood cells to be transfused today Administering Lasix in between units due to concerns for acute pulmonary edema due to bouts of hypoxia Monitoring hemoglobin and hematocrit with serial CBCs Hold Eliquis for now until hemoglobin has stabilized Hematology additionally following, their input is appreciated  Chronic atrial fibrillation with RVR ([place]) Slight rapid ventricular response with heart rates in the low 100s This is possibly exacerbated by acute anemia Continue home regimen of metoprolol as blood pressure tolerates Monitoring patient on telemetry Holding Eliquis for now as mentioned above due to concerns for  bleeding  S/P total left hip arthroplasty Status post left hip arthroplasty 10/17 with Dr. Alvan Dame Per orthopedic surgery, patient to be weightbearing as tolerated at this point PT, OT evaluations As needed opiate-based analgesics Obtaining vitamin D level  Coronary artery disease involving native heart without angina pectoris Continuing metoprolol, statin therapy Patient is currently chest pain-free Monitoring patient on telemetry  Type 2 diabetes mellitus without complication, without long-term current use of insulin ([place]) Patient been placed on Accu-Cheks before every meal and nightly with sliding scale insulin Holding home regimen of hypoglycemics Hemoglobin A1C 5.0% Diabetic Diet   Hypomagnesemia Replacing with intravenous magnesium sulfate Monitoring magnesium levels with serial chemistries.   Essential thrombocythemia ([place]) No significant thrombocytopenia at this time Dr. Marin Olp with hematology oncology following, his input is appreciated.  Mixed diabetic hyperlipidemia associated with type 2 diabetes mellitus ([place]) Continuing home regimen of lipid lowering therapy.         Subjective:   Patient complains of intermittent mild shortness of breath.  Patient denies any associated chest pain.  Shortness of breath is worse with any exertion whatsoever.  Physical Exam: Vitals:   11/05/21 1020 11/05/21 1414 11/05/21 1432 11/05/21 1503  BP: 118/72 109/72 126/82 118/75  Pulse: (!) 106 88 (!) 101 (!) 104  Resp: '14 16  14  '$ Temp: 98 F (36.7 C) 97.9 F (36.6 C) 98.4 F (36.9 C) 97.7 F (36.5 C)  TempSrc: Oral Oral Oral Oral  SpO2: 99% 100% 100% 96%  Weight:      Height:       Constitutional: Awake alert and oriented x3, no associated distress.   Skin: Significant skin pallor noted. Respiratory: Mild basilar rales.  No concurrent wheezing.  Normal respiratory effort. No accessory muscle use.  Cardiovascular: Tachycardic rate  with irregularly irregular rhythm, no  murmurs / rubs / gallops.  Left lower extremity pitting edema noted up through the thigh.  2+ pedal pulses. No carotid bruits.  Abdomen: Abdomen is soft and nontender.  No evidence of intra-abdominal masses.  Positive bowel sounds noted in all quadrants.   Musculoskeletal: No joint deformity upper and lower extremities. Good ROM, no contractures. Normal muscle tone.      Data Reviewed:  Hemoglobin 6.3   Family Communication:    Disposition: Status is: Observation The patient will require care spanning > 2 midnights and should be moved to inpatient because: Severe postoperative anemia requiring close clinical monitoring, transfusion of blood products and serial clinical assessments with additional administration of intravenous diuretics due to concerns for acute cardiogenic pulmonary edema.  Planned Discharge Destination: Home with Home Health    Time spent: 65 minutes  Author: Vernelle Emerald, MD 11/05/2021 6:57 PM  For on call review [email or web address].

## 2021-11-05 NOTE — Consult Note (Signed)
[name] is well-known to me.  Is a very nice 66 year old white male.  He has a history of essential thrombocythemia.  He is JAK2 positive.  He currently is on Hydrea.  He had surgery for the left hip.  Prior to surgery, we have been seen in the office in order to get his hemoglobin prepared.  He was given Retacrit in the office.  He is getting IV iron in the office.  When I saw him on the 16th, his hemoglobin was 11.8.  Platelet count 203,000.  He underwent surgery on 11/04/2021.  That time, his hemoglobin was 9.7.  His surgery went well.  There is no report of any obvious bleeding with surgery.  He had the left hip repaired.  Postop, his hemoglobin was down to 7.2.  This morning, his white cell count was 31.7.  Hemoglobin 6.3.  Platelet count 172,000.  He has ordered 1 unit of blood.  He is a little bit lethargic this morning.  This might be from pain medication.  He has a dressing over the incision site.  He probably will need to be transfused with another unit of blood.  I probably would check iron studies on him also.  On the 16th, his iron saturation was 15%.  I am sure this is probably no better.  I will give him a dose of IV iron.  He does have the atrial fibrillation.  This is chronic.  He is on Eliquis for this.  He has been urinating.  He has not yet had a bowel movement.  Had little bit of decreased appetite.  There is no nausea or vomiting.  His vital signs show temperature of 97.9.  Pulse 99.  Blood pressure 110/66.  His head and neck exam shows pale conjunctive a.  He has no scleral icterus.  He has no oral lesions.  Lungs are relatively clear bilaterally.  Cardiac exam tachycardic and irregular consistent with atrial fibrillation.  Abdomen is soft.  Bowel sounds are slightly decreased.  There is no guarding or rebound tenderness.  Extremity shows the wound in the left lateral hip.  There is no ecchymoses.  He has no swelling.  He has good pulses in his distal extremities.   Neurological exam shows some slight lethargy.  Again, Justin [name] is a 66 year old white male.  He has essential thrombocythemia.  He had hip surgery for the left hip.  I am sure that his quality of life will be a lot better now.  Again, I do think he by needs to have another unit of blood given to him.  He is ability to make blood is still compromised.  He really does not have that hide have an erythropoietin level.  I will give him a dose of Procrit also.  He does need iron.  The anemia is clearly multi factorial.  He is on Eliquis.  This will certainly increase his risk of bleeding although I really do not see any obvious bleeding right now.  I know that he had incredible care by Dr. Alvan Dame of Orthopedic Surgery.  It seems like his surgery went quite well.  We will follow him along and help out in any way that we can.  Lattie Haw, MD  Hebrews 12:12

## 2021-11-05 NOTE — Assessment & Plan Note (Addendum)
   Hemoglobin now stable at 9.8 today.  Known history of essential thrombocytopenia on Hydrea  No clinical evidence of bleeding  Total of 4 units of packed red blood cells transfused during this hospitalization on POD 0 and 1  MCV suggests a macrocytic anemia however vitamin B12 and folate from 08/2021 are unremarkable.   Monitoring hemoglobin and hematocrit with serial CBCs  Hematology additionally following, their input is appreciated

## 2021-11-06 ENCOUNTER — Inpatient Hospital Stay (HOSPITAL_COMMUNITY): Payer: Medicare Other

## 2021-11-06 DIAGNOSIS — N179 Acute kidney failure, unspecified: Secondary | ICD-10-CM

## 2021-11-06 DIAGNOSIS — D649 Anemia, unspecified: Secondary | ICD-10-CM | POA: Diagnosis not present

## 2021-11-06 DIAGNOSIS — G9341 Metabolic encephalopathy: Secondary | ICD-10-CM | POA: Diagnosis not present

## 2021-11-06 DIAGNOSIS — D62 Acute posthemorrhagic anemia: Secondary | ICD-10-CM | POA: Diagnosis not present

## 2021-11-06 LAB — URINALYSIS, ROUTINE W REFLEX MICROSCOPIC
Bilirubin Urine: NEGATIVE
Glucose, UA: NEGATIVE mg/dL
Ketones, ur: NEGATIVE mg/dL
Nitrite: NEGATIVE
Protein, ur: 100 mg/dL — AB
RBC / HPF: >50 RBC/hpf — ABNORMAL HIGH (ref 0–5)
Specific Gravity, Urine: 1.017 (ref 1.005–1.030)
pH: 5 (ref 5.0–8.0)

## 2021-11-06 LAB — COMPREHENSIVE METABOLIC PANEL
ALT: 13 U/L (ref 0–44)
AST: 59 U/L — ABNORMAL HIGH (ref 15–41)
Albumin: 2.9 g/dL — ABNORMAL LOW (ref 3.5–5.0)
Alkaline Phosphatase: 84 U/L (ref 38–126)
Anion gap: 9 (ref 5–15)
BUN: 20 mg/dL (ref 8–23)
CO2: 21 mmol/L — ABNORMAL LOW (ref 22–32)
Calcium: 8.1 mg/dL — ABNORMAL LOW (ref 8.9–10.3)
Chloride: 106 mmol/L (ref 98–111)
Creatinine, Ser: 2.15 mg/dL — ABNORMAL HIGH (ref 0.61–1.24)
GFR, Estimated: 33 mL/min — ABNORMAL LOW (ref >60)
Glucose, Bld: 124 mg/dL — ABNORMAL HIGH (ref 70–99)
Potassium: 4.7 mmol/L (ref 3.5–5.1)
Sodium: 136 mmol/L (ref 135–145)
Total Bilirubin: 1.1 mg/dL (ref 0.3–1.2)
Total Protein: 5.8 g/dL — ABNORMAL LOW (ref 6.5–8.1)

## 2021-11-06 LAB — C-REACTIVE PROTEIN: CRP: 9.1 mg/dL — ABNORMAL HIGH (ref <1.0)

## 2021-11-06 LAB — CBC
HCT: 30.5 % — ABNORMAL LOW (ref 39.0–52.0)
Hemoglobin: 9.9 g/dL — ABNORMAL LOW (ref 13.0–17.0)
MCH: 33.9 pg (ref 26.0–34.0)
MCHC: 32.5 g/dL (ref 30.0–36.0)
MCV: 104.5 fL — ABNORMAL HIGH (ref 80.0–100.0)
Platelets: 254 10*3/uL (ref 150–400)
RBC: 2.92 MIL/uL — ABNORMAL LOW (ref 4.22–5.81)
RDW: 24.2 % — ABNORMAL HIGH (ref 11.5–15.5)
WBC: 63.8 10*3/uL (ref 4.0–10.5)
nRBC: 0.8 % — ABNORMAL HIGH (ref 0.0–0.2)

## 2021-11-06 LAB — PROCALCITONIN: Procalcitonin: 0.54 ng/mL

## 2021-11-06 LAB — OCCULT BLOOD X 1 CARD TO LAB, STOOL: Fecal Occult Bld: NEGATIVE

## 2021-11-06 LAB — MAGNESIUM: Magnesium: 2 mg/dL (ref 1.7–2.4)

## 2021-11-06 MED ORDER — LACTATED RINGERS IV SOLN: INTRAVENOUS | Status: DC

## 2021-11-06 MED ORDER — PIPERACILLIN-TAZOBACTAM 3.375 G IVPB
3.3750 g | Freq: Three times a day (TID) | INTRAVENOUS | Status: DC
  Administered 2021-11-06 – 2021-11-09 (×8): 3.375 g via INTRAVENOUS
  Filled 2021-11-06 (×8): qty 50

## 2021-11-06 MED ORDER — APIXABAN 5 MG PO TABS
5.0000 mg | ORAL_TABLET | Freq: Two times a day (BID) | ORAL | Status: DC
  Administered 2021-11-06 – 2021-11-07 (×4): 5 mg via ORAL
  Filled 2021-11-06 (×4): qty 1

## 2021-11-06 MED ORDER — PIPERACILLIN-TAZOBACTAM 3.375 G IVPB 30 MIN
3.3750 g | Freq: Once | INTRAVENOUS | Status: AC
  Administered 2021-11-06: 3.375 g via INTRAVENOUS
  Filled 2021-11-06: qty 50

## 2021-11-06 MED ORDER — LOPERAMIDE HCL 2 MG PO CAPS
4.0000 mg | ORAL_CAPSULE | Freq: Once | ORAL | Status: AC
  Administered 2021-11-06: 4 mg via ORAL
  Filled 2021-11-06: qty 2

## 2021-11-06 NOTE — Progress Notes (Signed)
Pharmacy Antibiotic Note  Justin [name] is a 66 y.o. male admitted on 11/04/2021 for THA on 10/17.  Pharmacy has been consulted for piperacillin/tazobactam dosing.  Today, 11/06/21 WBC significantly elevated and increasing. Pt has received several doses of IV dexamethasone SCr 2.15 - increased. CrCl now ~30 mL/min  Plan: Piperacillin/tazobactam 3.375 g IV q8h EI Monitor renal function for any necessary dose adjustments  Height: '5\' 6"'$  (167.6 cm) Weight: 75.8 kg (167 lb) IBW/kg (Calculated) : 63.8  Temp (24hrs), Avg:97.9 F (36.6 C), Min:97.5 F (36.4 C), Max:98.4 F (36.9 C)  Recent Labs  Lab 11/03/21 1000 11/04/21 1435 11/04/21 1957 11/05/21 0347 11/05/21 1918 11/06/21 0344  WBC 22.3* 27.7* 26.4* 31.7* 46.3* 63.8*  CREATININE 1.27*  --   --  1.40*  --  2.15*    Estimated Creatinine Clearance: 30.1 mL/min (A) (by C-G formula based on SCr of 2.15 mg/dL (H)).    Allergies  Allergen Reactions   Codeine Swelling and Other (See Comments)    Tolerates hydrocodone, tramadol, and oxycodone    Dilaudid [Hydromorphone] Anxiety    Hallucinations    Doxycycline Itching and Rash    Antimicrobials this admission: cefazolin 10/17 >> 10/18 surgical prophylaxis Piperacillin/tazobactam 10/19 >>   Dose adjustments this admission:  Microbiology results: 10/19 UCx:     Lenis Noon, PharmD 11/06/2021 7:22 AM

## 2021-11-06 NOTE — Assessment & Plan Note (Addendum)
   Renal function improved to baseline.  AKI multifactorial  Substantial improvement in renal function since placement of a Foley catheter on 10/21 and initiation of Flomax.  Patient has a history of BPH with urinary retention in the past per review of old records  CT imaging of the abdomen and pelvis revealing circumferential bladder wall thickening concerning for chronic urinary retention  That being said, dural venous sinus thrombosis is also been known to cause urinary retention  Foley catheter removed however patient failed voiding trial and as such Foley catheter placed back in 11/12/2021.   Urine output of 485 cc over the past 24 hours, patient also on IV Lasix.  Slight bump in creatinine.  Blood pressure borderline with systolics in the 17E.  IV Lasix discontinued.  -Discontinued bethanechol.    Normal saline 250 cc bolus x1.  Monitor urine output.  Minimizing use of nephrotoxic agents  Monitoring renal function and electrolytes with serial chemistry  Strict input and output monitoring

## 2021-11-06 NOTE — Progress Notes (Signed)
Subjective: 2 Days Post-Op Procedure(s) (LRB): TOTAL HIP ARTHROPLASTY ANTERIOR APPROACH (Left) Patient reports pain as mild.   Patient seen in rounds for Dr. Alvan Dame. Patient is resting in bed on exam this morning. He is able to tell me he is at the hospital, and answers questions/follows commands appropriately. He has not been able to get up much with PT yet.  We will start therapy today.   Objective: Vital signs in last 24 hours: Temp:  [97.5 F (36.4 C)-98.4 F (36.9 C)] 98 F (36.7 C) (10/19 0524) Pulse Rate:  [88-106] 94 (10/19 0524) Resp:  [14-18] 18 (10/19 0524) BP: (109-135)/(72-95) 119/77 (10/19 0524) SpO2:  [96 %-100 %] 97 % (10/19 0524)  Intake/Output from previous day:  Intake/Output Summary (Last 24 hours) at 11/06/2021 0814 Last data filed at 11/06/2021 4854 Gross per 24 hour  Intake 2318.94 ml  Output 300 ml  Net 2018.94 ml     Intake/Output this shift: No intake/output data recorded.  Labs: Recent Labs    11/04/21 1435 11/04/21 1957 11/05/21 0347 11/05/21 1918 11/06/21 0344  HGB 9.7* 7.2* 6.3* 9.0* 9.9*   Recent Labs    11/05/21 1918 11/06/21 0344  WBC 46.3* 63.8*  RBC 2.66* 2.92*  HCT 27.6* 30.5*  PLT 226 254   Recent Labs    11/05/21 0347 11/06/21 0344  NA 141 136  K 3.1* 4.7  CL 111 106  CO2 22 21*  BUN 15 20  CREATININE 1.40* 2.15*  GLUCOSE 118* 124*  CALCIUM 7.5* 8.1*   No results for input(s): "LABPT", "INR" in the last 72 hours.  Exam: General - Patient is Alert and Appropriate Extremity - Neurologically intact Sensation intact distally Intact pulses distally Dorsiflexion/Plantar flexion intact Dressing - dressing C/D/I Motor Function - intact, moving foot and toes well on exam.   Past Medical History:  Diagnosis Date   Acute meniscal tear of knee LEFT   Anemia    Aortic stenosis 12/01/2017   NONRHEUMATIC, AORTIC VALVE CALCIFICATIONS, MILD TO MODERATE REGURG, MILD TO MODERATE CALCIFIED ANNULUS per ECHO 10/25/17 @  [facility] [place]   Arthritis    Atrial fibrillation ([place]) 11/12/2017   AT O/V WITH PCP   Cancer ([place])    skin right arm   Coronary artery disease    Dyspnea    GERD (gastroesophageal reflux disease)    Headache    hx of migraines   Heart murmur MILD-- ASYMPTOMATIC   History of kidney stones    Hyperlipidemia    Hypertension    Left knee pain    Mixed diabetic hyperlipidemia associated with type 2 diabetes mellitus ([place]) 11/05/2021   PAD (peripheral artery disease) ([facility])    left leg claudication   Sleep apnea     Assessment/Plan: 2 Days Post-Op Procedure(s) (LRB): TOTAL HIP ARTHROPLASTY ANTERIOR APPROACH (Left) Principal Problem:   Acute anemia Active Problems:   Chronic atrial fibrillation with RVR ([facility])   Essential thrombocythemia ([facility])   S/P aortic valve replacement with bioprosthetic valve   Coronary artery disease involving native heart without angina pectoris   Type 2 diabetes mellitus without complication, without long-term current use of insulin ([facility])   S/P total left hip arthroplasty   Mixed diabetic hyperlipidemia associated with type 2 diabetes mellitus ([facility])   Hypomagnesemia  Estimated body mass index is 26.95 kg/m as calculated from the following:   Height as of this encounter: '5\' 6"'$  (1.676 m).   Weight as of this encounter: 75.8 kg. Advance diet Up with  therapy  DVT Prophylaxis -  Eliquis Weight bearing as tolerated.  WBC 63.8 this AM, up from 46.3 yesterday.  Hgb stable at 9.9 this AM.  Plan to get up with PT today if able.  Continue to appreciate assistance from Dr. Marin Olp and the hospitalist team.   Griffith Citron, PA-C Orthopedic Surgery [phone] 11/06/2021, 8:14 AM

## 2021-11-06 NOTE — TOC Transition Note (Signed)
Transition of Care [facility]) - CM/SW Discharge Note   Patient Details  Name: Justin [name] MRN: [id number] Date of Birth: 1955/05/16  Transition of Care [facility]) CM/SW Contact:  Lennart Pall, LCSW Phone Number: 11/06/2021, 9:36 AM   Clinical Narrative:    Met with pt and spoke with wife to confirm he has all needed DME at home.  Plan for HEP.  No TOC needs.   Final next level of care: Home/Self Care Barriers to Discharge: No Barriers Identified   Patient Goals and CMS Choice Patient states their goals for this hospitalization and ongoing recovery are:: return home      Discharge Placement                       Discharge Plan and Services                DME Arranged: N/A DME Agency: NA                  Social Determinants of Health (SDOH) Interventions     Readmission Risk Interventions    11/06/2021    9:35 AM  Readmission Risk Prevention Plan  Transportation Screening Complete  PCP or Specialist Appt within 3-5 Days Complete  HRI or [place] Complete  Social Work Consult for [place] Planning/Counseling Complete  Palliative Care Screening Not Applicable  Medication Review [employer]) Complete

## 2021-11-06 NOTE — Progress Notes (Signed)
Justin [name] is clearly having issues this morning.  He is definitely more confused.  He had some episodes of clarity.  However, for the most part, he was somewhat disoriented.  He has some problems with volume overload yesterday.  He did get Lasix.  He had a chest x-ray which shows some pulmonary edema.  I know he got 2 units of blood.  I think he may have gotten the iron.  We will decrease his IV fluid down to 50 cc/hr.  He still has some urinary retention.  He did get Procrit yesterday.  He also got a dose of Decadron.  This probably wise white cell count is on the high side.  There may be some urinary infection.  His urine is being sent off for urine culture.  Given his history of this myeloproliferative disorder, I probably would have him on prophylactic antibiotics.  I just hate that he is having his issues after the hip surgery.  It seems the hip surgery went pretty well.  His blood work shows a white count of 63.8.  Hemoglobin 9.9.  Platelet count 254,000.  His sodium is 136.  Potassium 4.7.  BUN 20 creatinine 2.15.  His albumin is 2.9.  He does have the atrial fibrillation.  He has been off anticoagulation for his surgery.  He plan is to get back on the anticoagulation.  His vital signs show temperature of 98.  Pulse 94.  Blood pressure 119/77.  His lungs sound clear bilaterally.  Cardiac exam irregular rate and irregular rhythm consistent with the atrial fibrillation.  Abdomen is soft.  Bowel sounds are decreased.  There is no abdominal distention.  Extremity shows the healing left hip surgical scar.  Neurological exam shows no focal deficits.  Again he has his confusion.  Hopefully, this confusion is just secondary to medications.  Again might also be from possibility of a urinary tract infection.  His hemoglobin is better which is nice to see.  We will follow along as always.  I know that he is getting incredible care from everybody on [address].  Lattie Haw, MD  Romans 5:3-5

## 2021-11-06 NOTE — Progress Notes (Signed)
PROGRESS NOTE   Justin [name]  HYQ:[id number] DOB: 09-26-1955 DOA: 11/04/2021 PCP: [name], MD   Date of Service: the patient was seen and examined on 11/06/2021  Brief Narrative:  Justin [name] is an 66 y.o. male with history of essential thrombocythemia, history of functional disability to the left hip secondary to end-stage arthritis, failed outpatient nonsurgical conservative treatment greater than 12 weeks ago was admitted to the orthopedic service for left total hip arthroplasty.    Patient noted to have had significant blood loss in the perioperative period and received 2 units packed transfusions perioperatively on 10/17.  Patient initiated on tranexamic acid by orthopedic surgery, admitted to the hospital with hospitalist group consulted for assistance with suspected pulmonary edema in addition to assistance with management of patient's atrial fibrillation, diabetes, coronary artery disease.    Patient continued to exhibit substantial anemia on 10/18 requiring an additional 2 units of packed red blood cells transfused.  Hospitalization 95/28 was complicated by worsening leukocytosis and the development of encephalopathy concerning for an underlying infectious process.  Patient was placed on empiric antibiotic therapy.   Assessment & Plan: Assessment and Plan: * Acute anemia Hemoglobin this morning stable at 9.9 after total of 4 units transfused Patient continues to exhibit no clinical evidence of bleeding  clinically there is no obvious evidence of bleeding.  Bilirubin is unremarkable. Eliquis resumed today MCV suggests a macrocytic anemia however vitamin B12 and folate from 08/2021 are unremarkable. Monitoring hemoglobin and hematocrit with serial CBCs Hematology additionally following, their input is appreciated  Acute metabolic encephalopathy Development of progressive confusion and agitation overnight concerning for the development of acute metabolic  encephalopathy Considering the  associated leukocytosis there is concern for an underlying infectious process Additionally, patient received a dose of Ambien yesterday evening which could have exacerbated his delirium Hematology this morning initiated empiric antibiotic therapy especially considering patient's known history of myeloproliferative disorder Urinalysis is somewhat equivocal but patient may be suffering from a urinary tract infection Patient additionally has been exhibiting some diarrhea today but abdomen is nontender and therefore I doubt that this is a source Treating with intravenous antibiotics, monitoring for symptomatic improvement Will expand work-up if patient fails to clinically improve Discontinuing Ambien for now  Chronic atrial fibrillation with RVR ([place]) Improving rate control Continue home regimen of metoprolol as blood pressure tolerates Monitoring patient on telemetry Eliquis is being resumed today  S/P total left hip arthroplasty Status post left hip arthroplasty 10/17 with Dr. Alvan Dame Per orthopedic surgery, patient to be weightbearing as tolerated at this point PT, OT evaluations As needed opiate-based analgesics Obtaining vitamin D level  AKI (acute kidney injury) ([place]) Rising creatinine, possibly secondary to infection or volume depletion in the setting of bouts of diarrhea that started early this morning  obtaining urinalysis, obtaining renal ultrasound Hydrating patient with intravenous isotonic fluids.   Rate currently at 50 cc an hour, if renal function fails to improve may need to increase Avoiding nephrotoxic agents Monitoring renal function and electrolytes with serial chemistry Blood and output monitoring  Coronary artery disease involving native heart without angina pectoris Continuing metoprolol, statin therapy Patient is currently chest pain-free Monitoring patient on telemetry  Type 2 diabetes mellitus without complication, without  long-term current use of insulin (HCC)-resolved as of 11/06/2021 Patient been placed on Accu-Cheks before every meal and nightly with sliding scale insulin Holding home regimen of hypoglycemics Hemoglobin A1C 5.0% Diabetic Diet   Hypomagnesemia Replacing periodically   Essential thrombocythemia ([place]) No  significant thrombocytopenia at this time Dr. Marin Olp with hematology oncology following, his input is appreciated.  Mixed diabetic hyperlipidemia associated with type 2 diabetes mellitus ([place]) Continuing home regimen of lipid lowering therapy.        Physical Exam:  Vitals:   11/05/21 1858 11/05/21 2113 11/06/21 0524 11/06/21 1353  BP: 125/81 127/78 119/77 109/75  Pulse: (!) 103 100 94 99  Resp: '14 18 18 17  '$ Temp: 97.8 F (36.6 C) (!) 97.5 F (36.4 C) 98 F (36.7 C) (!) 97.5 F (36.4 C)  TempSrc: Oral Oral Oral Oral  SpO2: 100% 96% 97% 93%  Weight:      Height:        Constitutional: Awake alert and oriented x3, no associated distress.   Skin: dressing of the left hip is clean dry and intact.  increased skin pallor noted.  No rashes, no lesions, good skin turgor noted. Eyes: Pupils are equally reactive to light.  No evidence of scleral icterus or conjunctival pallor.  ENMT: Moist mucous membranes noted.  Posterior pharynx clear of any exudate or lesions.   Respiratory: Basilar rales noted, no evidence of wheezing, Normal respiratory effort. No accessory muscle use.  Cardiovascular: Yellowly irregular rhythm with controlled rate, no murmurs / rubs / gallops.  Faint edema noted of the left lower extremity.  2+ pedal pulses. No carotid bruits.  Abdomen: Abdomen is soft and nontender.  No evidence of intra-abdominal masses.  Positive bowel sounds noted in all quadrants.   Musculoskeletal: Mild pain with both passive and active range of motion of the left hip.  No joint deformity upper and lower extremities. Good ROM, no contractures. Normal muscle tone.    Data  Reviewed:  I have personally reviewed and interpreted labs, imaging.  Significant findings are   CBC: Recent Labs  Lab 11/03/21 1000 11/04/21 1435 11/04/21 1957 11/05/21 0347 11/05/21 1918 11/06/21 0344  WBC 22.3* 27.7* 26.4* 31.7* 46.3* 63.8*  NEUTROABS 19.0*  --   --   --   --   --   HGB 11.8* 9.7* 7.2* 6.3* 9.0* 9.9*  HCT 35.8* 30.5* 22.4* 19.6* 27.6* 30.5*  MCV 110.2* 108.2* 107.7* 106.5* 103.8* 104.5*  PLT 203 200 154 172 226 622   Basic Metabolic Panel: Recent Labs  Lab 11/03/21 1000 11/05/21 0347 11/06/21 0344  NA 144 141 136  K 4.0 3.1* 4.7  CL 108 111 106  CO2 25 22 21*  GLUCOSE 126* 118* 124*  BUN '14 15 20  '$ CREATININE 1.27* 1.40* 2.15*  CALCIUM 9.8 7.5* 8.1*  MG  --  1.5* 2.0   GFR: Estimated Creatinine Clearance: 30.1 mL/min (A) (by C-G formula based on SCr of 2.15 mg/dL (H)). Liver Function Tests: Recent Labs  Lab 11/03/21 1000 11/05/21 0347 11/06/21 0344  AST 18 29 59*  ALT '9 12 13  '$ ALKPHOS 102 61 84  BILITOT 1.6* 0.9 1.1  PROT 7.3 4.9* 5.8*  ALBUMIN 3.6 2.5* 2.9*   No results for input(s): "LIPASE", "AMYLASE" in the last 168 hours. No results for input(s): "AMMONIA" in the last 168 hours. Coagulation Profile: No results for input(s): "INR", "PROTIME" in the last 168 hours.   EKG/Telemetry: Personally reviewed.  Rhythm is atrial fibrillation with heart rate of 90 bpm.  No dynamic ST segment changes appreciated.   Code Status:  Full code  code status decision has been confirmed with: patient   Time spent:  56 minutes  Author:  Vernelle Emerald MD  11/06/2021 7:09 PM

## 2021-11-06 NOTE — Progress Notes (Signed)
Physical Therapy Treatment Patient Details Name: Justin [name] MRN: [id number] DOB: 08/12/1955 Today's Date: 11/06/2021   History of Present Illness 66 y.o. male s/p Left THA on 11/04/21.  PMHx: A.Fib on Eliquis, CAD s/p CABG, AS s/p AVR with bioprosthetic, s/p CEA, DM2, HTN, essential thrombocytosis/JAK2+ chronic leukocytosis    PT Comments    Pt on BSC on entering room and NT in room to assist with pericare.  Pt requiring min assist for stability.  Pt only able to tolerate 8 feet of ambulation due to weakness and fatigue.  Pt denies dizziness and c/o weakness.  RN in room upon pt sitting on bed after ambulating and attempted to obtain Spo2 with ear monitor however appeared unable despite numerous attempts even when pt assisted back to supine as well.    Recommendations for follow up therapy are one component of a multi-disciplinary discharge planning process, led by the attending physician.  Recommendations may be updated based on patient status, additional functional criteria and insurance authorization.  Follow Up Recommendations  Follow physician's recommendations for discharge plan and follow up therapies     Assistance Recommended at Discharge PRN  Patient can return home with the following Help with stairs or ramp for entrance   Equipment Recommendations  Rolling walker (2 wheels)    Recommendations for Other Services       Precautions / Restrictions Precautions Precautions: Fall     Mobility  Bed Mobility Overal bed mobility: Needs Assistance Bed Mobility: Sit to Supine     Supine to sit: Min assist Sit to supine: Mod assist   General bed mobility comments: assist for LEs    Transfers Overall transfer level: Needs assistance Equipment used: Rolling walker (2 wheels) Transfers: Sit to/from Stand Sit to Stand: Min assist   Step pivot transfers: Min assist       General transfer comment: verbal cues for safe technique; assist for steadying with use of  BSC and then standing at bedside for donning gown    Ambulation/Gait Ambulation/Gait assistance: Min assist Gait Distance (Feet): 8 Feet Assistive device: Rolling walker (2 wheels) Gait Pattern/deviations: Step-to pattern, Decreased stance time - left, Antalgic       General Gait Details: verbal cues for sequence, RW positioning, posture, step length, use of UEs through RW for more support; distance limited by weakness and fatigue, pt denies dizziness   Stairs             Wheelchair Mobility    Modified Rankin (Stroke Patients Only)       Balance                                            Cognition Arousal/Alertness: Awake/alert Behavior During Therapy: WFL for tasks assessed/performed                                   General Comments: orientated but cognition still seems not quite intact        Exercises Total Joint Exercises Ankle Circles/Pumps: AROM, Both, 10 reps Long Arc Quad: AROM, Left, 10 reps, Seated    General Comments        Pertinent Vitals/Pain Pain Assessment Pain Assessment: No/denies pain Pain Intervention(s): Repositioned, Monitored during session    Home Living  Prior Function            PT Goals (current goals can now be found in the care plan section) Progress towards PT goals: Progressing toward goals    Frequency    7X/week      PT Plan Current plan remains appropriate    Co-evaluation              AM-PAC PT "6 Clicks" Mobility   Outcome Measure  Help needed turning from your back to your side while in a flat bed without using bedrails?: A Little Help needed moving from lying on your back to sitting on the side of a flat bed without using bedrails?: A Little Help needed moving to and from a bed to a chair (including a wheelchair)?: A Little Help needed standing up from a chair using your arms (e.g., wheelchair or bedside chair)?: A  Little Help needed to walk in hospital room?: A Lot Help needed climbing 3-5 steps with a railing? : A Lot 6 Click Score: 16    End of Session Equipment Utilized During Treatment: Gait belt Activity Tolerance: Patient limited by fatigue Patient left: in bed;with call bell/phone within reach;with nursing/sitter in room   PT Visit Diagnosis: Other abnormalities of gait and mobility (R26.89)     Time: 1444-1500 PT Time Calculation (min) (ACUTE ONLY): 16 min  Charges:  $Gait Training: 8-22 mins                     Justin [name], DPT Physical Therapist Acute Rehabilitation Services Preferred contact method: Secure Chat Weekend Pager Only: [phone] Office: [phone]    Justin [name] [place] 11/06/2021, 3:41 PM

## 2021-11-06 NOTE — Assessment & Plan Note (Addendum)
   Secondary to dural venous sinus thrombosis seen on CTV 10/23 as the most likely cause of the patient's encephalopathy, in combination with hospital delirium that comes from prolonged hospitalization.  Clinical improvement.  Ambien, Benadryl and opiates were all discontinued earlier in the hospitalization.  B12 and folate levels unremarkable.  Ammonia obtained on 10/21 unremarkable despite concerns for possible cirrhosis  CT head and MRI brain essentially unremarkable  TSH slightly elevated likely secondary to euthyroid sick  VBG and ABG reveals no evidence of hypercapnia  Improving clinically daily, alert oriented to self place and time.  Following commands appropriately.   EEG with no seizures, moderate encephalopathy, no focal slowing.  Neurology was following but have signed off.   Low-dose opioid resumed for pain control, monitor mental status closely.  Continue supportive care.

## 2021-11-06 NOTE — Progress Notes (Signed)
Physical Therapy Treatment Patient Details Name: Justin [name] MRN: [id number] DOB: 1955/07/20 Today's Date: 11/06/2021   History of Present Illness 66 y.o. male s/p Left THA on 11/04/21.  PMHx: A.Fib on Eliquis, CAD s/p CABG, AS s/p AVR with bioprosthetic, s/p CEA, DM2, HTN, essential thrombocytosis/JAK2+ chronic leukocytosis    PT Comments    Pt pleasantly confused this morning.  Pt able to follow simple commands.  Pt with poor waveform while checking SPO2 however appears pt still requires oxygen (see mobility section below).  Pt agreeable to OOB to recliner and will attempt to ambulate next visit.     Recommendations for follow up therapy are one component of a multi-disciplinary discharge planning process, led by the attending physician.  Recommendations may be updated based on patient status, additional functional criteria and insurance authorization.  Follow Up Recommendations  Follow physician's recommendations for discharge plan and follow up therapies     Assistance Recommended at Discharge PRN  Patient can return home with the following Help with stairs or ramp for entrance   Equipment Recommendations  Rolling walker (2 wheels)    Recommendations for Other Services       Precautions / Restrictions Precautions Precautions: Fall Precaution Comments: supplemental oxygen     Mobility  Bed Mobility Overal bed mobility: Needs Assistance Bed Mobility: Supine to Sit     Supine to sit: Min assist     General bed mobility comments: support assist for Lt LE    Transfers Overall transfer level: Needs assistance Equipment used: Rolling walker (2 wheels) Transfers: Sit to/from Stand, Bed to chair/wheelchair/BSC Sit to Stand: Min assist   Step pivot transfers: Min assist       General transfer comment: verbal cues for safe technique; SPO2 reading 80% on 2L O2 [place] (pt wanted to try to reduce O2 L and was on 5L upon entering room at 99%) after transfer however  poor waveform; SPO2 86% with increase to 3L and pt requiring 4L O2 [place] for SPO2 96% at rest in recliner    Ambulation/Gait                   Stairs             Wheelchair Mobility    Modified Rankin (Stroke Patients Only)       Balance                                            Cognition Arousal/Alertness: Awake/alert Behavior During Therapy: WFL for tasks assessed/performed                                   General Comments: pleasantly confused this morning, tangential        Exercises Total Joint Exercises Ankle Circles/Pumps: AROM, Both, 10 reps Long Arc Quad: AROM, Left, 10 reps, Seated    General Comments        Pertinent Vitals/Pain Pain Assessment Pain Assessment: No/denies pain Pain Intervention(s): Monitored during session, Repositioned    Home Living                          Prior Function            PT Goals (current goals can now be found in the care  plan section) Progress towards PT goals: Progressing toward goals    Frequency    7X/week      PT Plan Current plan remains appropriate    Co-evaluation              AM-PAC PT "6 Clicks" Mobility   Outcome Measure  Help needed turning from your back to your side while in a flat bed without using bedrails?: A Little Help needed moving from lying on your back to sitting on the side of a flat bed without using bedrails?: A Little Help needed moving to and from a bed to a chair (including a wheelchair)?: A Little Help needed standing up from a chair using your arms (e.g., wheelchair or bedside chair)?: A Little Help needed to walk in hospital room?: A Lot Help needed climbing 3-5 steps with a railing? : A Lot 6 Click Score: 16    End of Session Equipment Utilized During Treatment: Gait belt;Oxygen Activity Tolerance: Patient tolerated treatment well Patient left: in chair;with call bell/phone within reach;with chair alarm  set Nurse Communication: Mobility status PT Visit Diagnosis: Other abnormalities of gait and mobility (R26.89)     Time: 1028-1050 PT Time Calculation (min) (ACUTE ONLY): 22 min  Charges:  $Therapeutic Activity: 8-22 mins                     Jannette Spanner PT, DPT Physical Therapist Acute Rehabilitation Services Preferred contact method: Secure Chat Weekend Pager Only: [phone] Office: [place] 11/06/2021, 11:34 AM

## 2021-11-06 NOTE — Addendum Note (Signed)
Addendum  created 11/06/21 0950 by [place] Pugh, CRNA   Flowsheet accepted, Intraprocedure Flowsheets edited

## 2021-11-06 NOTE — Plan of Care (Signed)
  Problem: Pain Management: Goal: Pain level will decrease with appropriate interventions Outcome: Progressing   

## 2021-11-07 ENCOUNTER — Inpatient Hospital Stay (HOSPITAL_COMMUNITY): Payer: Medicare Other

## 2021-11-07 ENCOUNTER — Other Ambulatory Visit: Payer: Self-pay

## 2021-11-07 DIAGNOSIS — N179 Acute kidney failure, unspecified: Secondary | ICD-10-CM | POA: Diagnosis not present

## 2021-11-07 DIAGNOSIS — I5022 Chronic systolic (congestive) heart failure: Secondary | ICD-10-CM | POA: Diagnosis present

## 2021-11-07 DIAGNOSIS — D649 Anemia, unspecified: Secondary | ICD-10-CM | POA: Diagnosis not present

## 2021-11-07 DIAGNOSIS — G9341 Metabolic encephalopathy: Secondary | ICD-10-CM | POA: Diagnosis not present

## 2021-11-07 DIAGNOSIS — I5023 Acute on chronic systolic (congestive) heart failure: Secondary | ICD-10-CM

## 2021-11-07 DIAGNOSIS — E43 Unspecified severe protein-calorie malnutrition: Secondary | ICD-10-CM | POA: Diagnosis present

## 2021-11-07 DIAGNOSIS — E440 Moderate protein-calorie malnutrition: Secondary | ICD-10-CM | POA: Diagnosis present

## 2021-11-07 DIAGNOSIS — R7989 Other specified abnormal findings of blood chemistry: Secondary | ICD-10-CM | POA: Diagnosis present

## 2021-11-07 DIAGNOSIS — Z96642 Presence of left artificial hip joint: Secondary | ICD-10-CM | POA: Diagnosis not present

## 2021-11-07 LAB — CBC WITH DIFFERENTIAL/PLATELET
Abs Immature Granulocytes: 3.63 10*3/uL — ABNORMAL HIGH (ref 0.00–0.07)
Basophils Absolute: 0.5 10*3/uL — ABNORMAL HIGH (ref 0.0–0.1)
Basophils Relative: 1 %
Eosinophils Absolute: 0.1 10*3/uL (ref 0.0–0.5)
Eosinophils Relative: 0 %
HCT: 26.7 % — ABNORMAL LOW (ref 39.0–52.0)
Hemoglobin: 8.7 g/dL — ABNORMAL LOW (ref 13.0–17.0)
Immature Granulocytes: 6 %
Lymphocytes Relative: 2 %
Lymphs Abs: 0.9 10*3/uL (ref 0.7–4.0)
MCH: 34.3 pg — ABNORMAL HIGH (ref 26.0–34.0)
MCHC: 32.6 g/dL (ref 30.0–36.0)
MCV: 105.1 fL — ABNORMAL HIGH (ref 80.0–100.0)
Monocytes Absolute: 6 10*3/uL — ABNORMAL HIGH (ref 0.1–1.0)
Monocytes Relative: 11 %
Neutro Abs: 45.2 10*3/uL — ABNORMAL HIGH (ref 1.7–7.7)
Neutrophils Relative %: 80 %
Platelets: 261 10*3/uL (ref 150–400)
RBC: 2.54 MIL/uL — ABNORMAL LOW (ref 4.22–5.81)
RDW: 23.9 % — ABNORMAL HIGH (ref 11.5–15.5)
WBC: 56.3 10*3/uL (ref 4.0–10.5)
nRBC: 1.4 % — ABNORMAL HIGH (ref 0.0–0.2)

## 2021-11-07 LAB — SODIUM, URINE, RANDOM: Sodium, Ur: 29 mmol/L

## 2021-11-07 LAB — ECHOCARDIOGRAM COMPLETE
AR max vel: 0.64 cm2
AV Area VTI: 0.56 cm2
AV Area mean vel: 0.63 cm2
AV Mean grad: 12 mmHg
AV Peak grad: 25.4 mmHg
Ao pk vel: 2.52 m/s
Height: 66 in
S' Lateral: 4.6 cm
Weight: 2672.01 oz

## 2021-11-07 LAB — URINE CULTURE: Culture: NO GROWTH

## 2021-11-07 LAB — COMPREHENSIVE METABOLIC PANEL
ALT: 15 U/L (ref 0–44)
AST: 70 U/L — ABNORMAL HIGH (ref 15–41)
Albumin: 2.4 g/dL — ABNORMAL LOW (ref 3.5–5.0)
Alkaline Phosphatase: 76 U/L (ref 38–126)
Anion gap: 9 (ref 5–15)
BUN: 25 mg/dL — ABNORMAL HIGH (ref 8–23)
CO2: 19 mmol/L — ABNORMAL LOW (ref 22–32)
Calcium: 7.5 mg/dL — ABNORMAL LOW (ref 8.9–10.3)
Chloride: 106 mmol/L (ref 98–111)
Creatinine, Ser: 2.12 mg/dL — ABNORMAL HIGH (ref 0.61–1.24)
GFR, Estimated: 33 mL/min — ABNORMAL LOW (ref >60)
Glucose, Bld: 97 mg/dL (ref 70–99)
Potassium: 3.7 mmol/L (ref 3.5–5.1)
Sodium: 134 mmol/L — ABNORMAL LOW (ref 135–145)
Total Bilirubin: 1.1 mg/dL (ref 0.3–1.2)
Total Protein: 5 g/dL — ABNORMAL LOW (ref 6.5–8.1)

## 2021-11-07 LAB — MAGNESIUM: Magnesium: 1.8 mg/dL (ref 1.7–2.4)

## 2021-11-07 LAB — MISC LABCORP TEST (SEND OUT): Labcorp test code: 81950

## 2021-11-07 LAB — BRAIN NATRIURETIC PEPTIDE: B Natriuretic Peptide: 1749.2 pg/mL — ABNORMAL HIGH (ref 0.0–100.0)

## 2021-11-07 LAB — CREATININE, URINE, RANDOM: Creatinine, Urine: 104 mg/dL

## 2021-11-07 MED ORDER — BETHANECHOL CHLORIDE 10 MG PO TABS
10.0000 mg | ORAL_TABLET | Freq: Three times a day (TID) | ORAL | Status: DC
  Administered 2021-11-07 – 2021-11-11 (×13): 10 mg via ORAL
  Filled 2021-11-07 (×15): qty 1

## 2021-11-07 MED ORDER — EPOETIN ALFA 20000 UNIT/ML IJ SOLN
40000.0000 [IU] | Freq: Once | INTRAMUSCULAR | Status: AC
  Administered 2021-11-07: 40000 [IU] via SUBCUTANEOUS
  Filled 2021-11-07: qty 2

## 2021-11-07 MED ORDER — SODIUM CHLORIDE 0.9 % IV SOLN
510.0000 mg | Freq: Once | INTRAVENOUS | Status: AC
  Administered 2021-11-07: 510 mg via INTRAVENOUS
  Filled 2021-11-07: qty 17

## 2021-11-07 MED ORDER — SODIUM CHLORIDE 0.9 % IV SOLN: INTRAVENOUS | Status: DC | PRN

## 2021-11-07 MED ORDER — ENSURE ENLIVE PO LIQD
237.0000 mL | Freq: Two times a day (BID) | ORAL | Status: DC
  Administered 2021-11-07 – 2021-11-13 (×4): 237 mL via ORAL

## 2021-11-07 MED ORDER — LACTATED RINGERS IV SOLN: INTRAVENOUS | Status: AC

## 2021-11-07 MED ORDER — LACTATED RINGERS IV SOLN: INTRAVENOUS | Status: DC

## 2021-11-07 NOTE — Progress Notes (Signed)
Physical Therapy Treatment Patient Details Name: Justin [name] MRN: [id number] DOB: 22-Apr-1955 Today's Date: 11/07/2021   History of Present Illness 66 y.o. male s/p Left THA on 11/04/21.  PMHx: A.Fib on Eliquis, CAD s/p CABG, AS s/p AVR with bioprosthetic, s/p CEA, DM2, HTN, essential thrombocytosis/JAK2+ chronic leukocytosis    PT Comments    Pt assisted back to bed from recliner.  Pt appears pleasantly confused this afternoon.  Pt not able to process tasks (such as correctly using his cell phone) and requiring direct instructions to perform tasks.  Requested RN into room to observe red area superior and posterior from pt's upper incision area and also notified of cognition.  Attempted to call wife, Justin [name], however no one answered.  Uncertain of pt's cognitive baseline as well as assist available upon d/c.   Recommendations for follow up therapy are one component of a multi-disciplinary discharge planning process, led by the attending physician.  Recommendations may be updated based on patient status, additional functional criteria and insurance authorization.  Follow Up Recommendations  Follow physician's recommendations for discharge plan and follow up therapies     Assistance Recommended at Discharge Frequent or constant Supervision/Assistance  Patient can return home with the following Help with stairs or ramp for entrance   Equipment Recommendations  Rolling walker (2 wheels)    Recommendations for Other Services       Precautions / Restrictions Precautions Precautions: Fall     Mobility  Bed Mobility Overal bed mobility: Needs Assistance Bed Mobility: Sit to Supine     Supine to sit: Min assist Sit to supine: Mod assist   General bed mobility comments: assist for LEs    Transfers Overall transfer level: Needs assistance Equipment used: Rolling walker (2 wheels) Transfers: Sit to/from Stand Sit to Stand: Min assist, +2 safety/equipment   Step pivot  transfers: Min assist, +2 safety/equipment       General transfer comment: verbal cues for safe technique; assist for steadying; pt reported his "gut" was upset and agreeable to ambulate to bathroom, pt started stepping over to bed instead and then stated he wasn't going to bathroom (did not appear to recall the goal was to walk into bathroom); pt assisted safely back to bed    Ambulation/Gait   Stairs             Wheelchair Mobility    Modified Rankin (Stroke Patients Only)       Balance                                            Cognition Arousal/Alertness: Awake/alert Behavior During Therapy: WFL for tasks assessed/performed Overall Cognitive Status: Within Functional Limits for tasks assessed                                 General Comments: pt pleasantly confused this afternoon.  pt not able process tasks (goal was to walk into bathroom and pt started back to bed, also using his cell phone incorrectly)        Exercises      General Comments        Pertinent Vitals/Pain Pain Assessment Pain Assessment: No/denies pain Pain Intervention(s): Repositioned, Monitored during session    Home Living  Prior Function            PT Goals (current goals can now be found in the care plan section) Progress towards PT goals: Progressing toward goals (slow progress)    Frequency    7X/week      PT Plan Current plan remains appropriate    Co-evaluation              AM-PAC PT "6 Clicks" Mobility   Outcome Measure  Help needed turning from your back to your side while in a flat bed without using bedrails?: A Lot Help needed moving from lying on your back to sitting on the side of a flat bed without using bedrails?: A Lot Help needed moving to and from a bed to a chair (including a wheelchair)?: A Lot Help needed standing up from a chair using your arms (e.g., wheelchair or bedside  chair)?: A Lot Help needed to walk in hospital room?: A Lot Help needed climbing 3-5 steps with a railing? : A Lot 6 Click Score: 12    End of Session Equipment Utilized During Treatment: Gait belt Activity Tolerance: Other (comment);Patient limited by fatigue (limited by cognition) Patient left: in bed;with call bell/phone within reach;with nursing/sitter in room Nurse Communication: Mobility status PT Visit Diagnosis: Other abnormalities of gait and mobility (R26.89)     Time: 8138-8719 PT Time Calculation (min) (ACUTE ONLY): 18 min  Charges:   $Therapeutic Activity: 8-22 mins                    Jannette Spanner PT, DPT Physical Therapist Acute Rehabilitation Services Preferred contact method: Secure Chat Weekend Pager Only: [phone] Office: [place] 11/07/2021, 3:43 PM

## 2021-11-07 NOTE — Progress Notes (Signed)
PROGRESS NOTE   Justin [name]  WUJ:[id number] DOB: Mar 27, 1955 DOA: 11/04/2021 PCP: [name], MD   Date of Service: the patient was seen and examined on 11/07/2021  Brief Narrative:  Justin [name] is an 66 y.o. male with history of essential thrombocythemia, history of functional disability to the left hip secondary to end-stage arthritis, failed outpatient nonsurgical conservative treatment greater than 12 weeks ago was admitted to the orthopedic service for left total hip arthroplasty.    Patient noted to have had significant blood loss in the perioperative period and received 2 units packed transfusions perioperatively on 10/17.  Patient initiated on tranexamic acid by orthopedic surgery, admitted to the hospital with hospitalist group consulted for assistance with suspected pulmonary edema in addition to assistance with management of patient's atrial fibrillation, diabetes, coronary artery disease.    Patient continued to exhibit substantial anemia on 10/18 requiring an additional 2 units of packed red blood cells transfused.  Hospitalization 95/62 was complicated by worsening leukocytosis and the development of encephalopathy concerning for an underlying infectious process.  Patient was placed on empiric antibiotic therapy.  Hospital course also complicated by acute kidney injury.   Assessment and Plan: * S/P total left hip arthroplasty Status post left hip arthroplasty 10/17 with Dr. Alvan Dame Per orthopedic surgery, patient to be weightbearing as tolerated at this point PT, OT evaluations As needed opiate-based analgesics Obtaining vitamin D level  Acute metabolic encephalopathy Continued waxing and waning encephalopathy despite treatment for suspected underlying infection and discontinuation of Ambien We will additionally discontinue other opiate-based analgesics if patient can tolerate as needed Tylenol for analgesia alone Expanding work-up to include ammonia,  VBG Continuing antibiotics for now which were started for possible urinary tract infection although if cultures are negative we will likely discontinue after an approximate 3-day course Diarrhea patient was experiencing has resolved, likely secondary to preceeding laxative use. Minimize uncomfortable stimuli  AKI (acute kidney injury) ([place]) Patient's acute kidney injury has stabilized with creatinine now at 2.12, slightly down from a peak of 2.15 compared to patient's baseline of approximately 1.3. Etiology is not entirely clear although due to patient's low urine output and tenuous oral intake this may actually be due to volume depletion. Urinary tract infection and infection were thought to also potentially be contributing, urine cultures pending while on antibiotics. Bladder scans and renal ultrasound have revealed no evidence of urinary retention. Clinically patient appears volume depleted.  We will cautiously increase the rate of intravenous fluids throughout the day and overnight while monitoring for any evidence of volume overload. Obtaining renal electrolytes to evaluate for other potential causes Zosyn can additionally cause acute kidney injury on occasion, will consider discontinuation of this drug if urine electrolytes suggest intrinsic renal disease. Avoiding nephrotoxic agents Monitoring renal function and electrolytes with serial chemistry Strict input and output monitoring  Acute anemia Hemoglobin is slightly lower but for the most part is stable Known history of essential thrombocytopenia on Hydrea No clinical evidence of bleeding Total of 4 units of packed red blood cells transfused during this hospitalization Eliquis resumed 10/19 MCV suggests a macrocytic anemia however vitamin B12 and folate from 08/2021 are unremarkable. Monitoring hemoglobin and hematocrit with serial CBCs Hematology additionally following, their input is appreciated  Coronary artery disease involving  native heart without angina pectoris Continuing metoprolol, statin therapy Patient is currently chest pain-free Monitoring patient on telemetry  Chronic atrial fibrillation with RVR (HCC) Improved rate control Continue home regimen of metoprolol as blood pressure tolerates Monitoring  patient on telemetry Eliquis resumed 10/19  Type 2 diabetes mellitus without complication, without long-term current use of insulin (HCC)-resolved as of 11/06/2021 Patient been placed on Accu-Cheks before every meal and nightly with sliding scale insulin Holding home regimen of hypoglycemics Hemoglobin A1C 5.0% Diabetic Diet   Elevated brain natriuretic peptide (BNP) level Elevated but no overt signs of volume overload Known history of coronary artery disease, aortic valve replacement with bioprosthetic valve and atrial fibrillation Obtaining echocardiogram  Hypomagnesemia Replacing periodically   Essential thrombocythemia ([place]) No significant thrombocytopenia at this time Dr. Marin Olp with hematology oncology following, his input is appreciated.  Mixed diabetic hyperlipidemia associated with type 2 diabetes mellitus ([place]) Continuing home regimen of lipid lowering therapy.     Subjective:  Patient complains of minimal left hip pain.  Patient denies shortness of breath or chest pain.  Physical Exam:  Vitals:   11/06/21 1353 11/06/21 2043 11/07/21 0500 11/07/21 1408  BP: 109/75 112/65 111/63 (!) 107/59  Pulse: 99 98 100 95  Resp: '17 18 18 16  '$ Temp: (!) 97.5 F (36.4 C) 97.6 F (36.4 C) (!) 97.5 F (36.4 C) 98.2 F (36.8 C)  TempSrc: Oral Oral Oral   SpO2: 93% 96% 93% 93%  Weight:      Height:       Constitutional: Awake alert and oriented x 1, no associated distress.   Skin: increased skin pallor noted, dressing of the left hip is clean dry and intact. Eyes: Pupils are equally reactive to light.  No evidence of scleral icterus or conjunctival pallor.  ENMT: Extremely dry mucous  membranes noted.  Posterior pharynx clear of any exudate or lesions.   Respiratory: clear to auscultation bilaterally, no wheezing, no crackles. Normal respiratory effort. No accessory muscle use.  Cardiovascular: Irregularly irregular rhythm, 3 out of 6 murmur noted.  Edema of the left lower extremity seems to be improving.. 2+ pedal pulses. No carotid bruits.  Abdomen: Abdomen is soft and nontender.  No evidence of intra-abdominal masses.  Positive bowel sounds noted in all quadrants.   Musculoskeletal: Limited range of motion of the left hip with associated pain.  no contractures. Normal muscle tone.    Data Reviewed:  I have personally reviewed and interpreted labs, imaging.  Significant findings are   CBC: Recent Labs  Lab 11/03/21 1000 11/04/21 1435 11/04/21 1957 11/05/21 0347 11/05/21 1918 11/06/21 0344 11/07/21 0329  WBC 22.3*   < > 26.4* 31.7* 46.3* 63.8* 56.3*  NEUTROABS 19.0*  --   --   --   --   --  45.2*  HGB 11.8*   < > 7.2* 6.3* 9.0* 9.9* 8.7*  HCT 35.8*   < > 22.4* 19.6* 27.6* 30.5* 26.7*  MCV 110.2*   < > 107.7* 106.5* 103.8* 104.5* 105.1*  PLT 203   < > 154 172 226 254 261   < > = values in this interval not displayed.   Basic Metabolic Panel: Recent Labs  Lab 11/03/21 1000 11/05/21 0347 11/06/21 0344 11/07/21 0329  NA 144 141 136 134*  K 4.0 3.1* 4.7 3.7  CL 108 111 106 106  CO2 25 22 21* 19*  GLUCOSE 126* 118* 124* 97  BUN '14 15 20 '$ 25*  CREATININE 1.27* 1.40* 2.15* 2.12*  CALCIUM 9.8 7.5* 8.1* 7.5*  MG  --  1.5* 2.0 1.8   GFR: Estimated Creatinine Clearance: 30.5 mL/min (A) (by C-G formula based on SCr of 2.12 mg/dL (H)). Liver Function Tests: Recent Labs  Lab  11/03/21 1000 11/05/21 0347 11/06/21 0344 11/07/21 0329  AST 18 29 59* 70*  ALT '9 12 13 15  '$ ALKPHOS 102 61 84 76  BILITOT 1.6* 0.9 1.1 1.1  PROT 7.3 4.9* 5.8* 5.0*  ALBUMIN 3.6 2.5* 2.9* 2.4*    Coagulation Profile: No results for input(s): "INR", "PROTIME" in the last 168  hours.   Telemetry: Personally reviewed.  Rhythm is atrial fibrillation with heart rate of 100 bpm.   Code Status:  Full code    Time spent:  56 minutes  Author:  Vernelle Emerald MD  11/07/2021 7:47 PM

## 2021-11-07 NOTE — Progress Notes (Signed)
This morning, Justin [name] is doing better mentally.  He is more alert and oriented.  His problem might be some urinary retention.  I would have believe this is from medications.  I would know if this is from the anesthesia that he got.  I will try him on some bethanechol (10 mg p.o. 3 times daily) to see if this may help.  He is not complaining of a lot of pain.  I am unsure how much he is walking around yet.  No physical therapy is working with him.  He has had no cough.  No shortness of breath.  His white cell count is 56.3.  Hemoglobin 8.7.  Platelet count 261,000.  I would still just watch the white cell count.  Hopefully will continue to trend downward.  His sodium is 134.  Potassium 3.7.  BUN 25 creatinine 2.12.  Calcium 7.5 with an albumin of 2.4.  I think he is on a regular diet now.  Hopefully, this will help with his nutritional state.  He is back on Eliquis.  I think he probably needs to be on Eliquis because of the atrial fibrillation.  I do not think there is been any diarrhea.  Again urinary retention seems to be an issue.  His vital signs are temperature of 97.5.  Pulse 100.  Blood pressure 111/63.  His head neck exam shows no ocular or oral lesions.  His lungs are clear bilaterally.  He has good air movement bilaterally.  Cardiac exam regular rate and rhythm.  I do not hear the atrial fibrillation.  His abdomen is soft.  Bowel sounds are still slightly decreased.  Extremity shows the healing left hip surgical wound.  Neurological exam shows no focal deficits.  Hopefully, Justin [name] will be able to go home soon.  I think the urinary retention might be a little bit of an issue.  His hemoglobin is drifting downward a little bit.  We could certainly give him another dose of Procrit.  I think this would help.  He got iron already.  I want to try to avoid transfusing him because of issues with volume overload.  His white cell count I think will gradually come down.  This may take a  while.  I continue to hold the [facility] for right now.  Again, I know he is getting fantastic care from everybody on [address]  Lattie Haw, MD  [place] 12:12

## 2021-11-07 NOTE — Assessment & Plan Note (Addendum)
   Dry mucous membranes and dark urine on exam with concurrent bibasilar rales and diffuse pitting edema consistent with intravascular depletion with anasarca.  Elevated on 10/20 at 1749, now down trended to 1137 on 10/23  Patient was aggressively diuresed with intravenous Lasix from 10/20 until morning of 10/23  Patient noted to have significant bilateral effusions with evidence of anasarca on exam and on CT imaging early on during the hospitalization which seems to be improved.   However I feel the majority of this volume overload is now due to third spacing from malnutrition/possible cirrhosis and not acute congestive heart failure   echocardiogram revealing slightly decreased ejection fraction of 45 to 50% revealing some mild systolic dysfunction which was confirmed via focused echo on 10/22.  Per Dr Darrick Grinder discussion with the wife on 10/21  patient has been experiencing increasing shortness of breath for at least the past month even prior to his hip surgery.  Repeat chest x-ray, still with concerns for pulmonary edema and as such patient placed on Lasix 20 mg IV every 12 hours in addition to IV albumin scheduled every 6 hours x24 hours.    Discontinued IV Lasix on 11/13/2021.  Improving clinically, currently on 2 L of nasal cannula O2 with sats of 99%.  Monitor urine output.  I's and O's.

## 2021-11-07 NOTE — Progress Notes (Signed)
Initial Nutrition Assessment  DOCUMENTATION CODES:   Non-severe (moderate) malnutrition in context of social or environmental circumstances  INTERVENTION:  -Continue regular diet as tolerated -Provide meal preferences to optimize po intake -Provide Ensure Plus HP BID (350kcal, 20g protein) -Consider daily MVI if po intake remains poor.  NUTRITION DIAGNOSIS:  Moderate Malnutrition related to social / environmental circumstances as evidenced by percent weight loss, moderate fat depletion, moderate muscle depletion.  GOAL:  Patient will meet greater than or equal to 90% of their needs   MONITOR:  PO intake, Supplement acceptance  REASON FOR ASSESSMENT:  Consult Poor PO  ASSESSMENT:  Pt is a 66yo M with PMH of HTN, HLD, arthritis, Left knee pain, heart murmur, aortic stenosis, afib, PAD, anemia, CAD, GERD, DM and skin cancer who presents for L THA after failing outpatient conservative treatment.  Visited pt this morning, he had just finished with therapy and was sitting up in chair. Pt reports poor appetite and po intake since retiring. Unable to tell me when he retired. Reports eating just 1x/day. Po intake meeting <75% estimated needs. Pt reports a significant 27.3% weight loss in a undetermined amount of time. Chart review shows a significant 5% weight loss in 1 month. Pt sites stress and indecisiveness about what to eat as the cause of poor intake. He shows physical signs of malnutrition with moderate to severe fat loss and moderate to severe muscle wasting. Pt meets ASPEN criteria for moderate protein calorie malnutrition r/t social/behavioral circumstances.     Pt was sipping on Ensure Max (150kcal, 30g protein) but reports he doesn't really like them. Recommend offering Ensure Plus HP BID to provide more calories/bottle (350kcal and 20g protein/bottle). Continue liberalized diet and provide meal preferences to optimize po intake. Denies taste changes, or other s/s of micronutrient  deficiencies.   Medications reviewed and include: eliquis, colace, claritin, lopressor, protonix, miralax, crestor, senna, LR '@100mL'$   Labs reviewed: Na:134, BUN:25, Cr:2.12, AST:70, ALT:15, GFR:33, BWL:8937.3   NUTRITION - FOCUSED PHYSICAL EXAM:  Flowsheet Row Most Recent Value  Orbital Region Severe depletion  Upper Arm Region Moderate depletion  Thoracic and Lumbar Region Moderate depletion  Buccal Region Moderate depletion  Temple Region Severe depletion  Clavicle Bone Region Severe depletion  Clavicle and Acromion Bone Region Severe depletion  Scapular Bone Region Moderate depletion  Dorsal Hand Moderate depletion  Patellar Region Moderate depletion  Anterior Thigh Region Moderate depletion  Posterior Calf Region Moderate depletion  Hair Reviewed  Eyes Reviewed  Mouth Reviewed  Skin Reviewed  Nails Reviewed       Diet Order:   Diet Order             Diet regular Room service appropriate? Yes; Fluid consistency: Thin  Diet effective now                   EDUCATION NEEDS:   Education needs have been addressed  Skin:  Skin Assessment: Skin Integrity Issues: Skin Integrity Issues:: Incisions Incisions: hip  Last BM:  10/19-type 7  Height:  Ht Readings from Last 1 Encounters:  11/04/21 '5\' 6"'$  (1.676 m)    Weight:  Wt Readings from Last 1 Encounters:  11/04/21 75.8 kg    BMI:  Body mass index is 26.95 kg/m.  Estimated Nutritional Needs:  Kcal:  1900-2300kcal  Protein:  75-115g  Fluid:  1940m  KCandise Bowens MS, RD, LDN, CNSC See AMiON for contact information

## 2021-11-07 NOTE — Progress Notes (Signed)
Subjective: 3 Days Post-Op Procedure(s) (LRB): TOTAL HIP ARTHROPLASTY ANTERIOR APPROACH (Left)  Patient seen in rounds for Dr. Alvan Dame.  Patient is resting in bed on exam this morning. No acute events overnight. He is alert and oriented this morning. He expresses that he is eager to get home when able.   Objective: Vital signs in last 24 hours: Temp:  [97.5 F (36.4 C)-97.6 F (36.4 C)] 97.5 F (36.4 C) (10/20 0500) Pulse Rate:  [98-100] 100 (10/20 0500) Resp:  [17-18] 18 (10/20 0500) BP: (109-112)/(63-75) 111/63 (10/20 0500) SpO2:  [93 %-96 %] 93 % (10/20 0500)  Intake/Output from previous day:  Intake/Output Summary (Last 24 hours) at 11/07/2021 0852 Last data filed at 11/07/2021 0600 Gross per 24 hour  Intake 1592.83 ml  Output 400 ml  Net 1192.83 ml     Intake/Output this shift: No intake/output data recorded.  Labs: Recent Labs    11/04/21 1957 11/05/21 0347 11/05/21 1918 11/06/21 0344 11/07/21 0329  HGB 7.2* 6.3* 9.0* 9.9* 8.7*   Recent Labs    11/06/21 0344 11/07/21 0329  WBC 63.8* 56.3*  RBC 2.92* 2.54*  HCT 30.5* 26.7*  PLT 254 261   Recent Labs    11/06/21 0344 11/07/21 0329  NA 136 134*  K 4.7 3.7  CL 106 106  CO2 21* 19*  BUN 20 25*  CREATININE 2.15* 2.12*  GLUCOSE 124* 97  CALCIUM 8.1* 7.5*   No results for input(s): "LABPT", "INR" in the last 72 hours.  Exam: General - Patient is Alert and Oriented Extremity - Neurologically intact Sensation intact distally Intact pulses distally Dorsiflexion/Plantar flexion intact Dressing - dressing C/D/I Motor Function - intact, moving foot and toes well on exam.   Past Medical History:  Diagnosis Date   Acute meniscal tear of knee LEFT   Anemia    Aortic stenosis 12/01/2017   NONRHEUMATIC, AORTIC VALVE CALCIFICATIONS, MILD TO MODERATE REGURG, MILD TO MODERATE CALCIFIED ANNULUS per ECHO 10/25/17 @ [facility]   Arthritis    Atrial fibrillation ([place]) 11/12/2017   AT O/V WITH  PCP   Cancer ([place])    skin right arm   Coronary artery disease    Dyspnea    GERD (gastroesophageal reflux disease)    Headache    hx of migraines   Heart murmur MILD-- ASYMPTOMATIC   History of kidney stones    Hyperlipidemia    Hypertension    Left knee pain    Mixed diabetic hyperlipidemia associated with type 2 diabetes mellitus ([place]) 11/05/2021   PAD (peripheral artery disease) (HCC)    left leg claudication   Sleep apnea     Assessment/Plan: 3 Days Post-Op Procedure(s) (LRB): TOTAL HIP ARTHROPLASTY ANTERIOR APPROACH (Left) Principal Problem:   Acute anemia Active Problems:   Chronic atrial fibrillation with RVR (HCC)   Essential thrombocythemia (HCC)   S/P aortic valve replacement with bioprosthetic valve   Coronary artery disease involving native heart without angina pectoris   AKI (acute kidney injury) (HCC)   S/P total left hip arthroplasty   Mixed diabetic hyperlipidemia associated with type 2 diabetes mellitus (HCC)   Hypomagnesemia   Acute metabolic encephalopathy  Estimated body mass index is 26.95 kg/m as calculated from the following:   Height as of this encounter: '5\' 6"'$  (1.676 m).   Weight as of this encounter: 75.8 kg.  DVT Prophylaxis -  Eliquis Weight bearing as tolerated.  Hgb stable at 8.7 this AM. Hematology on board. Appreciate their input.  Discussed with Dr. Cyd Silence this morning who is hopeful he may be medically stable for discharge tomorrow or Sunday.   From an orthopaedic standpoint, he will need to be meeting goals with PT to get home. We will try to have him up out of bed today ambulating if tolerated.   Griffith Citron, PA-C Orthopedic Surgery [phone] 11/07/2021, 8:52 AM

## 2021-11-07 NOTE — Progress Notes (Signed)
Physical Therapy Treatment Patient Details Name: Justin [name] MRN: [id number] DOB: [birth year] Today's Date: 11/07/2021   History of Present Illness 66 y.o. male s/p Left THA on 11/04/21.  PMHx: A.Fib on Eliquis, CAD s/p CABG, AS s/p AVR with bioprosthetic, s/p CEA, DM2, HTN, essential thrombocytosis/JAK2+ chronic leukocytosis    PT Comments    Pt very eager and agreeable to mobilize this morning.  Pt states he can't wait to go home.  Pt assisted with ambulating on only tolerated 30 ft x2 with seated rest break.  Pt mentions spouse however have not seen any visitors in pt's room, and pt would benefit from having whoever is home with him present for a session.     Recommendations for follow up therapy are one component of a multi-disciplinary discharge planning process, led by the attending physician.  Recommendations may be updated based on patient status, additional functional criteria and insurance authorization.  Follow Up Recommendations  Follow physician's recommendations for discharge plan and follow up therapies     Assistance Recommended at Discharge Frequent or constant Supervision/Assistance  Patient can return home with the following Help with stairs or ramp for entrance   Equipment Recommendations  Rolling walker (2 wheels)    Recommendations for Other Services       Precautions / Restrictions Precautions Precautions: Fall     Mobility  Bed Mobility Overal bed mobility: Needs Assistance Bed Mobility: Supine to Sit     Supine to sit: Min assist     General bed mobility comments: assist for LE    Transfers Overall transfer level: Needs assistance Equipment used: Rolling walker (2 wheels) Transfers: Sit to/from Stand Sit to Stand: Min assist           General transfer comment: verbal cues for safe technique; assist for steadying    Ambulation/Gait Ambulation/Gait assistance: Min assist, Min guard Gait Distance (Feet): 30 Feet (x2) Assistive  device: Rolling walker (2 wheels) Gait Pattern/deviations: Step-to pattern, Decreased stance time - left, Antalgic Gait velocity: decr     General Gait Details: verbal cues for sequence, RW positioning, posture, step length, use of UEs through RW for more support; 30 ft x2 with seated rest break   Stairs             Wheelchair Mobility    Modified Rankin (Stroke Patients Only)       Balance                                            Cognition Arousal/Alertness: Awake/alert Behavior During Therapy: WFL for tasks assessed/performed Overall Cognitive Status: Within Functional Limits for tasks assessed                                 General Comments: pt follows commands but cognition still seems not quite intact        Exercises      General Comments        Pertinent Vitals/Pain Pain Assessment Pain Assessment: No/denies pain Pain Intervention(s): Repositioned, Monitored during session    Home Living                          Prior Function            PT Goals (current goals can  now be found in the care plan section) Progress towards PT goals: Progressing toward goals    Frequency    7X/week      PT Plan Current plan remains appropriate    Co-evaluation              AM-PAC PT "6 Clicks" Mobility   Outcome Measure  Help needed turning from your back to your side while in a flat bed without using bedrails?: A Little Help needed moving from lying on your back to sitting on the side of a flat bed without using bedrails?: A Little Help needed moving to and from a bed to a chair (including a wheelchair)?: A Little Help needed standing up from a chair using your arms (e.g., wheelchair or bedside chair)?: A Little Help needed to walk in hospital room?: A Lot Help needed climbing 3-5 steps with a railing? : A Lot 6 Click Score: 16    End of Session Equipment Utilized During Treatment: Gait  belt Activity Tolerance: Patient limited by fatigue Patient left: in chair;with call bell/phone within reach;with chair alarm set Nurse Communication: Mobility status PT Visit Diagnosis: Other abnormalities of gait and mobility (R26.89)     Time: 7741-4239 PT Time Calculation (min) (ACUTE ONLY): 13 min  Charges:  $Gait Training: 8-22 mins                     Arlyce Dice, DPT Physical Therapist Acute Rehabilitation Services Preferred contact method: Secure Chat Weekend Pager Only: [phone] Office: [place] 11/07/2021, 3:18 PM

## 2021-11-08 ENCOUNTER — Inpatient Hospital Stay (HOSPITAL_COMMUNITY): Payer: Medicare Other

## 2021-11-08 DIAGNOSIS — I482 Chronic atrial fibrillation, unspecified: Secondary | ICD-10-CM | POA: Diagnosis not present

## 2021-11-08 DIAGNOSIS — E440 Moderate protein-calorie malnutrition: Secondary | ICD-10-CM

## 2021-11-08 DIAGNOSIS — I5023 Acute on chronic systolic (congestive) heart failure: Secondary | ICD-10-CM

## 2021-11-08 DIAGNOSIS — R7989 Other specified abnormal findings of blood chemistry: Secondary | ICD-10-CM

## 2021-11-08 DIAGNOSIS — R791 Abnormal coagulation profile: Secondary | ICD-10-CM | POA: Diagnosis present

## 2021-11-08 DIAGNOSIS — Z96642 Presence of left artificial hip joint: Secondary | ICD-10-CM | POA: Diagnosis not present

## 2021-11-08 DIAGNOSIS — D649 Anemia, unspecified: Secondary | ICD-10-CM | POA: Diagnosis not present

## 2021-11-08 DIAGNOSIS — N179 Acute kidney failure, unspecified: Secondary | ICD-10-CM | POA: Diagnosis not present

## 2021-11-08 DIAGNOSIS — G9341 Metabolic encephalopathy: Secondary | ICD-10-CM | POA: Diagnosis not present

## 2021-11-08 LAB — TYPE AND SCREEN
ABO/RH(D): A POS
Antibody Screen: NEGATIVE
Unit division: 0
Unit division: 0
Unit division: 0
Unit division: 0
Unit division: 0
Unit division: 0

## 2021-11-08 LAB — BPAM RBC
Blood Product Expiration Date: 202311012359
Blood Product Expiration Date: 202311082359
Blood Product Expiration Date: 202311092359
Blood Product Expiration Date: 202311092359
Blood Product Expiration Date: 202311122359
Blood Product Expiration Date: 202311122359
ISSUE DATE / TIME: 202310171303
ISSUE DATE / TIME: 202310171303
ISSUE DATE / TIME: 202310180611
ISSUE DATE / TIME: 202310181443
Unit Type and Rh: 6200
Unit Type and Rh: 6200
Unit Type and Rh: 6200
Unit Type and Rh: 6200
Unit Type and Rh: 6200
Unit Type and Rh: 6200

## 2021-11-08 LAB — VITAMIN B12: Vitamin B-12: 3470 pg/mL — ABNORMAL HIGH (ref 180–914)

## 2021-11-08 LAB — URINALYSIS, ROUTINE W REFLEX MICROSCOPIC
Bilirubin Urine: NEGATIVE
Glucose, UA: NEGATIVE mg/dL
Hgb urine dipstick: NEGATIVE
Ketones, ur: NEGATIVE mg/dL
Leukocytes,Ua: NEGATIVE
Nitrite: NEGATIVE
Protein, ur: NEGATIVE mg/dL
Specific Gravity, Urine: 1.01 (ref 1.005–1.030)
pH: 6 (ref 5.0–8.0)

## 2021-11-08 LAB — CBC WITH DIFFERENTIAL/PLATELET
Abs Immature Granulocytes: 0 10*3/uL (ref 0.00–0.07)
Band Neutrophils: 3 %
Basophils Absolute: 0.5 10*3/uL — ABNORMAL HIGH (ref 0.0–0.1)
Basophils Relative: 1 %
Blasts: 6 %
Eosinophils Absolute: 0 10*3/uL (ref 0.0–0.5)
Eosinophils Relative: 0 %
HCT: 30.8 % — ABNORMAL LOW (ref 39.0–52.0)
Hemoglobin: 9.9 g/dL — ABNORMAL LOW (ref 13.0–17.0)
Lymphocytes Relative: 0 %
Lymphs Abs: 0 10*3/uL — ABNORMAL LOW (ref 0.7–4.0)
MCH: 33.8 pg (ref 26.0–34.0)
MCHC: 32.1 g/dL (ref 30.0–36.0)
MCV: 105.1 fL — ABNORMAL HIGH (ref 80.0–100.0)
Monocytes Absolute: 4.1 10*3/uL — ABNORMAL HIGH (ref 0.1–1.0)
Monocytes Relative: 9 %
Neutro Abs: 37.9 10*3/uL — ABNORMAL HIGH (ref 1.7–7.7)
Neutrophils Relative %: 81 %
Platelets: 284 10*3/uL (ref 150–400)
RBC: 2.93 MIL/uL — ABNORMAL LOW (ref 4.22–5.81)
RDW: 24.1 % — ABNORMAL HIGH (ref 11.5–15.5)
WBC: 45.1 10*3/uL — ABNORMAL HIGH (ref 4.0–10.5)
nRBC: 3.2 % — ABNORMAL HIGH (ref 0.0–0.2)
nRBC: 5 /100 WBC — ABNORMAL HIGH

## 2021-11-08 LAB — GLUCOSE, CAPILLARY
Glucose-Capillary: 75 mg/dL (ref 70–99)
Glucose-Capillary: 106 mg/dL — ABNORMAL HIGH (ref 70–99)
Glucose-Capillary: 103 mg/dL — ABNORMAL HIGH (ref 70–99)

## 2021-11-08 LAB — COMPREHENSIVE METABOLIC PANEL
ALT: 15 U/L (ref 0–44)
AST: 62 U/L — ABNORMAL HIGH (ref 15–41)
Albumin: 2.5 g/dL — ABNORMAL LOW (ref 3.5–5.0)
Alkaline Phosphatase: 91 U/L (ref 38–126)
Anion gap: 7 (ref 5–15)
BUN: 27 mg/dL — ABNORMAL HIGH (ref 8–23)
CO2: 21 mmol/L — ABNORMAL LOW (ref 22–32)
Calcium: 7.8 mg/dL — ABNORMAL LOW (ref 8.9–10.3)
Chloride: 108 mmol/L (ref 98–111)
Creatinine, Ser: 2.04 mg/dL — ABNORMAL HIGH (ref 0.61–1.24)
GFR, Estimated: 35 mL/min — ABNORMAL LOW (ref >60)
Glucose, Bld: 87 mg/dL (ref 70–99)
Potassium: 3.6 mmol/L (ref 3.5–5.1)
Sodium: 136 mmol/L (ref 135–145)
Total Bilirubin: 1.8 mg/dL — ABNORMAL HIGH (ref 0.3–1.2)
Total Protein: 5.1 g/dL — ABNORMAL LOW (ref 6.5–8.1)

## 2021-11-08 LAB — SEDIMENTATION RATE: Sed Rate: 0 mm/hr (ref 0–16)

## 2021-11-08 LAB — TSH: TSH: 4.929 u[IU]/mL — ABNORMAL HIGH (ref 0.350–4.500)

## 2021-11-08 LAB — BLOOD GAS, VENOUS
Acid-base deficit: 2.9 mmol/L — ABNORMAL HIGH (ref 0.0–2.0)
Bicarbonate: 22 mmol/L (ref 20.0–28.0)
O2 Saturation: 26.1 %
Patient temperature: 36.1
pCO2, Ven: 37 mmHg — ABNORMAL LOW (ref 44–60)
pH, Ven: 7.38 (ref 7.25–7.43)
pO2, Ven: <31 mmHg — CL (ref 32–45)

## 2021-11-08 LAB — PROTIME-INR
INR: >10 (ref 0.8–1.2)
INR: >10 (ref 0.8–1.2)
Prothrombin Time: >90 seconds — ABNORMAL HIGH (ref 11.4–15.2)
Prothrombin Time: >90 seconds — ABNORMAL HIGH (ref 11.4–15.2)

## 2021-11-08 LAB — APTT
aPTT: 56 seconds — ABNORMAL HIGH (ref 24–36)
aPTT: 57 seconds — ABNORMAL HIGH (ref 24–36)

## 2021-11-08 LAB — BLOOD GAS, ARTERIAL
Acid-base deficit: 4.3 mmol/L — ABNORMAL HIGH (ref 0.0–2.0)
Bicarbonate: 19.6 mmol/L — ABNORMAL LOW (ref 20.0–28.0)
O2 Saturation: 97.4 %
Patient temperature: 37
pCO2 arterial: 31 mmHg — ABNORMAL LOW (ref 32–48)
pH, Arterial: 7.41 (ref 7.35–7.45)
pO2, Arterial: 80 mmHg — ABNORMAL LOW (ref 83–108)

## 2021-11-08 LAB — MAGNESIUM: Magnesium: 1.9 mg/dL (ref 1.7–2.4)

## 2021-11-08 LAB — TROPONIN I (HIGH SENSITIVITY)
Troponin I (High Sensitivity): 77 ng/L — ABNORMAL HIGH (ref <18)
Troponin I (High Sensitivity): 70 ng/L — ABNORMAL HIGH (ref <18)

## 2021-11-08 LAB — AMMONIA: Ammonia: 19 umol/L (ref 9–35)

## 2021-11-08 LAB — FOLATE: Folate: 8.2 ng/mL (ref >5.9)

## 2021-11-08 LAB — UREA NITROGEN, URINE: Urea Nitrogen, Ur: 397 mg/dL

## 2021-11-08 MED ORDER — FUROSEMIDE 10 MG/ML IJ SOLN
40.0000 mg | Freq: Two times a day (BID) | INTRAMUSCULAR | Status: AC
  Administered 2021-11-08 (×2): 40 mg via INTRAVENOUS
  Filled 2021-11-08 (×2): qty 4

## 2021-11-08 MED ORDER — FUROSEMIDE 10 MG/ML IJ SOLN
40.0000 mg | Freq: Two times a day (BID) | INTRAMUSCULAR | Status: AC
  Administered 2021-11-09 (×2): 40 mg via INTRAVENOUS
  Filled 2021-11-08 (×2): qty 4

## 2021-11-08 MED ORDER — THIAMINE HCL 100 MG/ML IJ SOLN: 100.0000 mg | Freq: Every day | INTRAMUSCULAR | Status: DC

## 2021-11-08 MED ORDER — TAMSULOSIN HCL 0.4 MG PO CAPS
0.4000 mg | ORAL_CAPSULE | Freq: Every day | ORAL | Status: DC
  Administered 2021-11-08 – 2021-11-19 (×11): 0.4 mg via ORAL
  Filled 2021-11-08 (×12): qty 1

## 2021-11-08 MED ORDER — PHYTONADIONE 5 MG PO TABS
5.0000 mg | ORAL_TABLET | Freq: Once | ORAL | Status: AC
  Administered 2021-11-08: 5 mg via ORAL
  Filled 2021-11-08: qty 1

## 2021-11-08 MED ORDER — THIAMINE HCL 100 MG PO TABS: 100.0000 mg | ORAL_TABLET | Freq: Every day | ORAL | Status: DC

## 2021-11-08 MED ORDER — THIAMINE HCL 100 MG/ML IJ SOLN
100.0000 mg | Freq: Every day | INTRAMUSCULAR | Status: DC
  Administered 2021-11-10: 100 mg via INTRAVENOUS
  Filled 2021-11-08: qty 2

## 2021-11-08 MED ORDER — ADULT MULTIVITAMIN W/MINERALS CH
1.0000 | ORAL_TABLET | Freq: Every day | ORAL | Status: DC
  Administered 2021-11-08 – 2021-11-19 (×11): 1 via ORAL
  Filled 2021-11-08 (×12): qty 1

## 2021-11-08 MED ORDER — THIAMINE MONONITRATE 100 MG PO TABS
100.0000 mg | ORAL_TABLET | Freq: Every day | ORAL | Status: DC
  Administered 2021-11-08 – 2021-11-19 (×11): 100 mg via ORAL
  Filled 2021-11-08 (×11): qty 1

## 2021-11-08 MED ORDER — NITROGLYCERIN 0.4 MG SL SUBL: 0.4000 mg | SUBLINGUAL_TABLET | SUBLINGUAL | Status: DC | PRN

## 2021-11-08 NOTE — Progress Notes (Signed)
PT Cancellation Note  Patient Details Name: Justin [name] MRN: [id number] DOB: February 10, 1955   Cancelled Treatment:    Reason Eval/Treat Not Completed: Medical issues which prohibited therapy. Will hold PT today and resume on tomorrow if medically appropriate. Thanks.    [facility]  Office: [phone] Pager: [phone]

## 2021-11-08 NOTE — Progress Notes (Signed)
Nutrition Follow-up  DOCUMENTATION CODES:   Non-severe (moderate) malnutrition in context of social or environmental circumstances  INTERVENTION:   -Ensure Plus High Protein po BID, each supplement provides 350 kcal and 20 grams of protein.   -Multivitamin with minerals daily  NUTRITION DIAGNOSIS:   Moderate Malnutrition related to social / environmental circumstances as evidenced by percent weight loss, moderate fat depletion, moderate muscle depletion.  Ongoing.  GOAL:   Patient will meet greater than or equal to 90% of their needs  Not meeting.  MONITOR:   PO intake, Supplement acceptance  REASON FOR ASSESSMENT:   Consult Poor PO, Assessment of nutrition requirement/status  ASSESSMENT:   Pt is a 66yo M with PMH of HTN, HLD, arthritis, Left knee pain, heart murmur, aortic stenosis, afib, PAD, anemia, CAD, GERD, DM and skin cancer who presents for L THA after failing outpatient conservative treatment.  Patient assessed by RD 10/20 for poor PO intake. Pt consumed 20% of lunch yesterday. No other meals documented. Pt received 1 Ensure so far and accepted. Continues to have some confusion. Will add daily MVI as PO hasn't really improved.  Admission weight: 167 lbs No other weights this admission.  Medications: Colace, Miralax, Senokot  Labs reviewed: CBGs: 75  Diet Order:   Diet Order             Diet regular Room service appropriate? Yes; Fluid consistency: Thin  Diet effective now                   EDUCATION NEEDS:   Education needs have been addressed  Skin:  Skin Assessment: Skin Integrity Issues: Skin Integrity Issues:: Incisions Incisions: hip  Last BM:  10/21 -type 7  Height:   Ht Readings from Last 1 Encounters:  11/04/21 '5\' 6"'$  (1.676 m)    Weight:   Wt Readings from Last 1 Encounters:  11/04/21 75.8 kg    BMI:  Body mass index is 26.95 kg/m.  Estimated Nutritional Needs:   Kcal:  1900-2300kcal  Protein:   75-115g  Fluid:  1946m  LClayton Bibles MS, RD, LDN Inpatient Clinical Dietitian Contact information available via Amion

## 2021-11-08 NOTE — Progress Notes (Addendum)
PROGRESS NOTE   Justin [name]  SAY:[id number] DOB: September 25, 1955 DOA: 11/04/2021 PCP: [name], MD   Date of Service: the patient was seen and examined on 11/08/2021  Brief Narrative:  66 y.o. male with history of essential thrombocythemia, paroxysmal atrial fibrillation on Eliquis, coronary artery disease status post CABG history of functional disability to the left hip secondary to end-stage arthritis, failed outpatient nonsurgical conservative treatment greater than 12 weeks ago was admitted to the orthopedic service for left total hip arthroplasty.    Patient noted to have had significant blood loss in the perioperative period and received 2 units packed transfusions perioperatively on 10/17.  Patient initiated on tranexamic acid by orthopedic surgery, admitted to the hospital with hospitalist group consulted for assistance with suspected pulmonary edema in addition to assistance with management of patient's atrial fibrillation, diabetes, coronary artery disease.    Patient continued to exhibit substantial anemia on 10/18 requiring an additional 2 units of packed red blood cells transfused.  Hospitalization 23/55 was complicated by worsening leukocytosis and the development of encephalopathy concerning for an underlying infectious process.  Patient was placed on empiric antibiotic therapy.  Hospital course also complicated by acute kidney injury.   Assessment and Plan: * S/P total left hip arthroplasty Status post left hip arthroplasty 10/17 with Dr. Alvan Dame Per orthopedic surgery, patient to be weightbearing as tolerated at this point PT, OT evaluations As needed opiate-based analgesics   Acute metabolic encephalopathy Slightly worsening encephalopathy of unclear etiology Persisting despite the discontinuation of opiate-based analgesics and Ambien B12 and folate levels unremarkable. Ammonia obtained this morning unremarkable despite concerns for cirrhosis CT head  unremarkable TSH slightly elevated likely secondary to euthyroid sick VBG and ABG reveals no evidence of hypercapnia Case discussed with Dr. Rory Percy with neurology.  He recommends obtaining a MRI of the brain and will additionally have his team come evaluate the patient and provide further recommendations.  Their input is greatly appreciated.   AKI (acute kidney injury) ([place]) Patient's acute kidney injury has continued to improve slightly  Multiple possible etiologies.  Initially there was concern for an infectious process then there was concern for volume depletion.   FEUrea was calculated to be 28.8% suggestive of a prerenal cause of AKI prompting gentle hydration overnight Now, it turns out the patient is having repeated bouts of urinary retention over the past 24 hours.  Perhaps the bladder scans earlier in the hospitalization were inaccurate? Regardless, patient is requiring straight catheterization 3 times in 24 hours and will likely need an indwelling Foley catheter No longer providing patient with intravenous fluids in the setting of a recurrence of pulmonary edema and hypoxia Zosyn was also considered to be a causative agent however FEUrea calculationa does not support this Minimizing use of nephrotoxic agents Monitoring renal function and electrolytes with serial chemistry Strict input and output monitoring  Acute on chronic systolic CHF (congestive heart failure) (HCC) Elevated on 10/20 at 1749 Echocardiogram revealing slightly decreased ejection fraction of 45 to 50% just above some mild systolic dysfunction.  Cardiology has recommended repeating a limited study with Definity and therefore this has been ordered. In the setting of recurrent pulmonary edema on 10/21 we are once again placing the patient on a short course of intravenous diuretics.   We will start with Lasix 40 mg twice daily. Consider cardiology consultation if patient worsens -Per my discussion with the wife today  patient has been experiencing increasing shortness of breath for at least the past month even prior  to his hip surgery.  Acute anemia Hemoglobin now stable Known history of essential thrombocytopenia on Hydrea No clinical evidence of bleeding Total of 4 units of packed red blood cells transfused during this hospitalization on POD 0 and 1 Eliquis resumed 10/19 however we are now discontinuing it in light of the extremely elevated INR MCV suggests a macrocytic anemia however vitamin B12 and folate from 08/2021 are unremarkable. Monitoring hemoglobin and hematocrit with serial CBCs Hematology additionally following, their input is appreciated  Elevated INR Profoundly elevated INR, confirmed with repeat testing No clinical evidence of bleeding Clinical picture not consistent with DIC Unsure as to how this finding placed into the right upper quadrant ultrasound suggestive of cirrhosis.   We will ask Dr. Bryan Lemma with gastroenterology to evaluate.  His input is appreciated. Furthermore, appreciate Dr. Antonieta Pert input on this finding. Administering oral vitamin K Holding Eliquis -which can cause an elevated INR but typically not greater than 2  Type 2 diabetes mellitus without complication, without long-term current use of insulin (HCC)-resolved as of 11/06/2021 Patient been placed on Accu-Cheks before every meal and nightly with sliding scale insulin Holding home regimen of hypoglycemics Hemoglobin A1C 5.0% Diabetic Diet   Hypomagnesemia Replacing periodically   Chronic atrial fibrillation with RVR ([place]) Improved rate control Continue home regimen of metoprolol as blood pressure tolerates Monitoring patient on telemetry Eliquis resumed 10/19 but now discontinued on 10/21 due to markedly elevated INR.  Leukocytosis Cultures negative thus far Substantial leukocytosis downtrending We will likely discontinue Zosyn in the morning  Coronary artery disease involving native heart  without angina pectoris Continuing metoprolol, statin therapy Patient is currently chest pain-free Monitoring patient on telemetry  Essential thrombocythemia ([place]) No significant thrombocytopenia at this time Dr. Marin Olp with hematology oncology following, his input is appreciated.  Malnutrition of moderate degree Poor oral intake with evidence of fat depletion and weight loss consistent with moderate protein calorie malnutrition Nutrition following, their input is appreciated Nutritional supplements initiated Multivitamin  Mixed diabetic hyperlipidemia associated with type 2 diabetes mellitus ([place]) Continuing home regimen of lipid lowering therapy.        Subjective:  Patient complaining of mild shortness of breath.  Patient also complaining of intermittent left hip pain.  Patient is notably confused and unable to provide further history  Physical Exam:  Vitals:   11/08/21 1606 11/08/21 1655 11/08/21 1700 11/08/21 1953  BP: 117/69   124/71  Pulse: (!) 104   98  Resp: 20   18  Temp: (!) 97.5 F (36.4 C)   (!) 97.5 F (36.4 C)  TempSrc: Oral   Oral  SpO2: 100% (!) 83% 96% 96%  Weight:      Height:        Constitutional: Awake alert and oriented x1, patient is exhibiting mild respiratory distress Skin: Increased skin pallor noted.  No rashes, no lesions, good skin turgor noted. Eyes: Pupils are equally reactive to light.  No evidence of scleral icterus or conjunctival pallor.  ENMT: Moist mucous membranes noted.  Posterior pharynx clear of any exudate or lesions.   Respiratory: clear to auscultation bilaterally, no wheezing, no crackles. Normal respiratory effort. No accessory muscle use.  Cardiovascular: Cardiac rate with irregularly irregular rhythm, no murmurs / rubs / gallops. No extremity edema. 2+ pedal pulses. No carotid bruits.  Abdomen: Abdomen is soft and nontender.  No evidence of intra-abdominal masses.  Positive bowel sounds noted in all quadrants.    Musculoskeletal: Pain with both passive and active range of  motion of the left hip.  Data Reviewed:  I have personally reviewed and interpreted labs, imaging.  Significant findings are   CBC: Recent Labs  Lab 11/03/21 1000 11/04/21 1435 11/05/21 0347 11/05/21 1918 11/06/21 0344 11/07/21 0329 11/08/21 0401  WBC 22.3*   < > 31.7* 46.3* 63.8* 56.3* 45.1*  NEUTROABS 19.0*  --   --   --   --  45.2* 37.9*  HGB 11.8*   < > 6.3* 9.0* 9.9* 8.7* 9.9*  HCT 35.8*   < > 19.6* 27.6* 30.5* 26.7* 30.8*  MCV 110.2*   < > 106.5* 103.8* 104.5* 105.1* 105.1*  PLT 203   < > 172 226 254 261 284   < > = values in this interval not displayed.   Basic Metabolic Panel: Recent Labs  Lab 11/03/21 1000 11/05/21 0347 11/06/21 0344 11/07/21 0329 11/08/21 0401  NA 144 141 136 134* 136  K 4.0 3.1* 4.7 3.7 3.6  CL 108 111 106 106 108  CO2 25 22 21* 19* 21*  GLUCOSE 126* 118* 124* 97 87  BUN '14 15 20 '$ 25* 27*  CREATININE 1.27* 1.40* 2.15* 2.12* 2.04*  CALCIUM 9.8 7.5* 8.1* 7.5* 7.8*  MG  --  1.5* 2.0 1.8 1.9   GFR: Estimated Creatinine Clearance: 31.7 mL/min (A) (by C-G formula based on SCr of 2.04 mg/dL (H)). Liver Function Tests: Recent Labs  Lab 11/03/21 1000 11/05/21 0347 11/06/21 0344 11/07/21 0329 11/08/21 0401  AST 18 29 59* 70* 62*  ALT '9 12 13 15 15  '$ ALKPHOS 102 61 84 76 91  BILITOT 1.6* 0.9 1.1 1.1 1.8*  PROT 7.3 4.9* 5.8* 5.0* 5.1*  ALBUMIN 3.6 2.5* 2.9* 2.4* 2.5*    Coagulation Profile: Recent Labs  Lab 11/08/21 0401 11/08/21 0806  INR >10.0* >10.0*     Telemetry: Personally reviewed.  Rhythm is atrial fibrillation with heart rate of 108 bpm.  No dynamic ST segment changes appreciated.   Code Status:  Full code  code status decision has been confirmed with: patient Family Communication: Case discussed in detail with the wife via phone conversation   Time spent:  75 minutes  Author:  Vernelle Emerald MD  11/08/2021 8:02 PM

## 2021-11-08 NOTE — Assessment & Plan Note (Addendum)
   Profoundly elevated INR, confirmed with repeat testing  No clinical evidence of bleeding  Clinical picture not consistent with DIC  Unsure as to how this finding placed into the right upper quadrant ultrasound suggestive of cirrhosis.    We will ask Dr. Bryan Lemma with gastroenterology to evaluate.  His input is appreciated.  Furthermore, appreciate Dr. Antonieta Pert input on this finding.  Administering oral vitamin K  Holding Eliquis -which can cause an elevated INR but typically not greater than 2

## 2021-11-08 NOTE — Consult Note (Addendum)
Neurology Consultation  Reason for Consult: confusion Referring Physician: Dr. Cyd Silence  CC: s/p total left hip arthroplasty  History is obtained from:medical record   HPI: Justin [name] is a 66 y.o. male with past medical history of HTN, HLD, A fib on Eliquis, sleep apnea, DM, CAD, and thrombocythemia who underwent left hip arthroplasty on 10/17. Patient had significant blood loss perioperatively and was transfused 2 units packed cells. He was also given tranexamic acid, on 10/18 required another 2 units of packed cells for significant anemia. On 10/19 he became encephalopathic, with worsening leukocytosis of 63.8. Since, he has been confused, leukocytosis is improving. CT head was obtained with no acute findings. Neurology consulted for assistance  He denies any pain, SOB, weakness, numbness or tingling, slurred speech.   Attempted to call patients wife with no answer, left message for her to call me back to determine what his baseline memory function was prior to hospitalization. Spoke with Patients wife [name] she states his memory has not been good lately, she says he's very forgetful and scatter brained at times and not as sharp, however there are times when he tends to be sharper than other times. She states, since he had COVID 2 years ago his memory has declined    ROS: Full ROS was performed and is negative except as noted in the HPI.  Unable to obtain due to altered mental status.   Past Medical History:  Diagnosis Date   Acute meniscal tear of knee LEFT   Anemia    Aortic stenosis 12/01/2017   NONRHEUMATIC, AORTIC VALVE CALCIFICATIONS, MILD TO MODERATE REGURG, MILD TO MODERATE CALCIFIED ANNULUS per ECHO 10/25/17 @ [facility] [address]   Arthritis    Atrial fibrillation ([place]) 11/12/2017   AT O/V WITH PCP   Cancer ([place])    skin right arm   Coronary artery disease    Dyspnea    GERD (gastroesophageal reflux disease)    Headache    hx of migraines   Heart murmur MILD--  ASYMPTOMATIC   History of kidney stones    Hyperlipidemia    Hypertension    Left knee pain    Mixed diabetic hyperlipidemia associated with type 2 diabetes mellitus ([place]) 11/05/2021   PAD (peripheral artery disease) (HCC)    left leg claudication   Sleep apnea      Family History  Adopted: Yes  Problem Relation Age of Onset   Bladder Cancer Father        adopted father     Social History:   reports that he quit smoking about 3 years ago. His smoking use included cigars and cigarettes. He has a 5.75 pack-year smoking history. He has never used smokeless tobacco. He reports that he does not currently use alcohol after a past usage of about 2.0 standard drinks of alcohol per week. He reports that he does not use drugs.  Medications  Current Facility-Administered Medications:    0.9 %  sodium chloride infusion, , Intravenous, PRN, Paralee Cancel, MD, Last Rate: 10 mL/hr at 11/07/21 2105, New Bag at 11/07/21 2105   acetaminophen (TYLENOL) tablet 325-650 mg, 325-650 mg, Oral, Q6H PRN, Irving Copas, PA-C, 650 mg at 11/08/21 1043   albuterol (PROVENTIL) (2.5 MG/3ML) 0.083% nebulizer solution 2.5 mg, 2.5 mg, Inhalation, Q6H PRN, Irving Copas, PA-C   bethanechol (URECHOLINE) tablet 10 mg, 10 mg, Oral, TID, Volanda Napoleon, MD, 10 mg at 11/08/21 1044   bisacodyl (DULCOLAX) suppository 10 mg, 10 mg, Rectal, Daily  PRN, Irving Copas, PA-C   diphenhydrAMINE (BENADRYL) 12.5 MG/5ML elixir 12.5-25 mg, 12.5-25 mg, Oral, Q4H PRN, Irving Copas, PA-C   docusate sodium (COLACE) capsule 100 mg, 100 mg, Oral, BID, Irving Copas, PA-C, 100 mg at 11/08/21 1044   feeding supplement (ENSURE ENLIVE / ENSURE PLUS) liquid 237 mL, 237 mL, Oral, BID BM, Paralee Cancel, MD, 237 mL at 11/07/21 1454   furosemide (LASIX) injection 40 mg, 40 mg, Intravenous, BID, Shalhoub, Sherryll Burger, MD   loratadine (CLARITIN) tablet 10 mg, 10 mg, Oral, Daily, Irving Copas, PA-C, 10 mg at 11/08/21 1043    menthol-cetylpyridinium (CEPACOL) lozenge 3 mg, 1 lozenge, Oral, PRN **OR** phenol (CHLORASEPTIC) mouth spray 1 spray, 1 spray, Mouth/Throat, PRN, Irving Copas, PA-C   methocarbamol (ROBAXIN) tablet 500 mg, 500 mg, Oral, Q6H PRN **OR** methocarbamol (ROBAXIN) 500 mg in dextrose 5 % 50 mL IVPB, 500 mg, Intravenous, Q6H PRN, Irving Copas, PA-C   metoCLOPramide (REGLAN) tablet 5-10 mg, 5-10 mg, Oral, Q8H PRN **OR** metoCLOPramide (REGLAN) injection 5-10 mg, 5-10 mg, Intravenous, Q8H PRN, Irving Copas, PA-C   metoprolol tartrate (LOPRESSOR) tablet 25 mg, 25 mg, Oral, BID, Eugenie Filler, MD, 25 mg at 11/08/21 1043   multivitamin with minerals tablet 1 tablet, 1 tablet, Oral, Daily, Paralee Cancel, MD, 1 tablet at 11/08/21 1043   nitroGLYCERIN (NITROSTAT) SL tablet 0.4 mg, 0.4 mg, Sublingual, Q5 min PRN, Shalhoub, Sherryll Burger, MD   ondansetron [facility]) tablet 4 mg, 4 mg, Oral, Q6H PRN **OR** ondansetron (ZOFRAN) injection 4 mg, 4 mg, Intravenous, Q6H PRN, Irving Copas, PA-C   pantoprazole (PROTONIX) EC tablet 40 mg, 40 mg, Oral, BID, Irving Copas, PA-C, 40 mg at 11/08/21 1043   phytonadione (VITAMIN K) tablet 5 mg, 5 mg, Oral, Once, Shalhoub, Sherryll Burger, MD   piperacillin-tazobactam (ZOSYN) IVPB 3.375 g, 3.375 g, Intravenous, Q8H, Lenis Noon, RPH, Last Rate: 12.5 mL/hr at 11/08/21 0617, 3.375 g at 11/08/21 0617   polyethylene glycol (MIRALAX / GLYCOLAX) packet 17 g, 17 g, Oral, Daily, Irving Copas, PA-C, 17 g at 11/05/21 1020   potassium chloride (KLOR-CON M) CR tablet 20 mEq, 20 mEq, Oral, Daily PRN, Irving Copas, PA-C   rosuvastatin (CRESTOR) tablet 20 mg, 20 mg, Oral, Daily, Irving Copas, PA-C, 20 mg at 11/08/21 1043   senna (SENOKOT) tablet 17.2 mg, 2 tablet, Oral, QHS, Irving Copas, PA-C, 17.2 mg at 11/05/21 2306   tamsulosin (FLOMAX) capsule 0.4 mg, 0.4 mg, Oral, Daily, Shalhoub, Sherryll Burger, MD, 0.4 mg at 11/08/21 1053   Exam: Current vital signs: BP  126/82 (BP Location: Left Arm)   Pulse (!) 111   Temp 98 F (36.7 C)   Resp (!) 24   Ht 5' 6"  (1.676 m)   Wt 75.8 kg   SpO2 93%   BMI 26.95 kg/m  Vital signs in last 24 hours: Temp:  [97.8 F (36.6 C)-98.2 F (36.8 C)] 98 F (36.7 C) (10/21 0520) Pulse Rate:  [95-111] 111 (10/21 1002) Resp:  [16-24] 24 (10/21 1002) BP: (104-133)/(57-88) 126/82 (10/21 1002) SpO2:  [90 %-93 %] 93 % (10/21 1002)  GENERAL: elderly pleasant male Awake, alert in NAD HEENT: - Normocephalic and atraumatic, dry mm LUNGS - Clear to auscultation bilaterally with no wheezes CV - S1S2 RRR, no m/r/g, equal pulses bilaterally. ABDOMEN - Soft, nontender, nondistended with normoactive BS Ext: warm, well perfused, intact peripheral pulses, no edema  NEURO:  Mental Status: AA&Ox3  to self and age, place- that he is in the hospital and situation. Unable to state correct month and city-states its "January" and "[place]", Unable to name current president, year or name of hospital. He can follow commands and repeat and name objects. He can only recall 4 animals that walk on 4 legs, Can spell "World" forwards but no backwards " L[place]" and recalls 1/3 words for memory recall.  Language: speech is clear.  Cranial Nerves: PERRL EOMI, visual fields full, no facial asymmetry, facial sensation intact, hearing intact, tongue/uvula/soft palate midline, normal sternocleidomastoid and trapezius muscle strength. No evidence of tongue atrophy or fibrillations Motor: 5/5 in bilateral uppers. Right lower 4/5 left lower 3/5 (recent hip surgery) Tone: is normal and bulk is normal Sensation- Intact to light touch bilaterally Coordination: FTN intact bilaterally Gait- deferred  Labs I have reviewed labs in epic and the results pertinent to this consultation are:  CBC    Component Value Date/Time   WBC 45.1 (H) 11/08/2021 0401   RBC 2.93 (L) 11/08/2021 0401   HGB 9.9 (L) 11/08/2021 0401   HGB 11.8 (L) 11/03/2021 1000   HGB 7.7 (L)  08/14/2021 1329   HCT 30.8 (L) 11/08/2021 0401   HCT 24.2 (L) 08/14/2021 1329   PLT 284 11/08/2021 0401   PLT 203 11/03/2021 1000   PLT 180 08/14/2021 1329   MCV 105.1 (H) 11/08/2021 0401   MCV 106 (H) 08/14/2021 1329   MCH 33.8 11/08/2021 0401   MCHC 32.1 11/08/2021 0401   RDW 24.1 (H) 11/08/2021 0401   RDW 20.4 (H) 08/14/2021 1329   LYMPHSABS 0.0 (L) 11/08/2021 0401   LYMPHSABS 1.6 08/14/2021 1329   MONOABS 4.1 (H) 11/08/2021 0401   EOSABS 0.0 11/08/2021 0401   EOSABS 0.1 08/14/2021 1329   BASOSABS 0.5 (H) 11/08/2021 0401   BASOSABS 0.8 (H) 08/14/2021 1329    CMP     Component Value Date/Time   NA 136 11/08/2021 0401   NA 134 08/14/2021 1329   K 3.6 11/08/2021 0401   CL 108 11/08/2021 0401   CO2 21 (L) 11/08/2021 0401   GLUCOSE 87 11/08/2021 0401   BUN 27 (H) 11/08/2021 0401   BUN 15 08/14/2021 1329   CREATININE 2.04 (H) 11/08/2021 0401   CREATININE 1.27 (H) 11/03/2021 1000   CALCIUM 7.8 (L) 11/08/2021 0401   PROT 5.1 (L) 11/08/2021 0401   PROT 6.4 08/14/2021 1329   ALBUMIN 2.5 (L) 11/08/2021 0401   ALBUMIN 3.6 (L) 08/14/2021 1329   AST 62 (H) 11/08/2021 0401   AST 18 11/03/2021 1000   ALT 15 11/08/2021 0401   ALT 9 11/03/2021 1000   ALKPHOS 91 11/08/2021 0401   BILITOT 1.8 (H) 11/08/2021 0401   BILITOT 1.6 (H) 11/03/2021 1000   GFRNONAA 35 (L) 11/08/2021 0401   GFRNONAA >60 11/03/2021 1000   GFRAA >60 10/12/2019 0805   GFRAA >60 07/11/2019 0952    Lipid Panel     Component Value Date/Time   CHOL 127 02/17/2018 1014   TRIG 80 02/17/2018 1014   HDL 41 02/17/2018 1014   CHOLHDL 3.1 02/17/2018 1014   VLDL 16 02/17/2018 1014   LDLCALC 70 02/17/2018 1014     Imaging I have reviewed the images obtained:  CT-head 1. No acute intracranial CT findings. 2. Mild atrophy and small-vessel disease, but with progression since 2021. 3. Heavy carotid atherosclerosis  Assessment:  66 y.o. male with past medical history of HTN, HLD, A fib on Eliquis, sleep  apnea, DM,  CAD, and thrombocythemia who underwent left hip arthroplasty on 10/17. Developed significant anemia requiring transfusion of packed cells, worsening leukocytosis and confusion   Recommendations: - Delirium precautions  - MRI Brain (already ordered)  - Check TSH, B1, B12 , ESR,  folate and RPR - Speech therapy for cognitive evaluation - Neurology will continue to follow   Beulah Gandy DNP, ACNPC-AG   I have seen the patient and reviewed the above note.  He has had mild problems for the past few years, attributed to COVID, but is still completely independent and sharp, no concerns that have merited any type of work-up or diagnosis.  Over the past 3 weeks, he has had progressive confusion.  He has had multiple physiological stressors in that time, including anemia with multiple transfusions, hemorrhage from a injury after being knocked down by a dog, surgery with a total hip arthroplasty.  He has been in the hospital since 10/17 and developed worsening mental status last night.  One of the note, he has had a markedly decreased appetite over the past 3 weeks, eating very little.  From description, it sounds like delirium and his improvement today but agree with this.  If he does have a predisposition to delirium, even prior to the surgery the multiple transfusions and bleeding could be contributing.  There has been some question of cirrhosis, and I do wonder if this could be playing some role, though his normal ammonia would argue against it.  If he continues to be encephalopathic, may need to consider lactulose empirically.  Neurology will continue to follow.  Roland Rack, MD [facility] [phone]  If 7pm- 7am, please page neurology on call as listed in [place].

## 2021-11-08 NOTE — Progress Notes (Signed)
PT Cancellation Note  Patient Details Name: Justin [name] MRN: [id number] DOB: [birth year]   Cancelled Treatment:    Reason Eval/Treat Not Completed: Medical issues which prohibited therapy. Secure chat with RN who recommended PT hold morning session. Will check back in with RN later today to see if pt is appropriate.    [facility]  Office: [phone] Pager: [phone]

## 2021-11-08 NOTE — Assessment & Plan Note (Addendum)
   Worsening leukocytosis, impressively high  Cultures negative thus far  Infectious disease consultation obtained 10/23  They believed at the time the patient is not suffering from an infectious process and that no further work-up or empiric antibiotics are necessary.  Patient received a 3-day course of intravenous Zosyn from 10/19 until 10/21 which was discontinued due to no evidence of infection  Peripheral smear on 10/22 unremarkable  Patient placed back on hydroxyurea 10/24 by Dr. Marin Olp for leukocytosis which is currently at 70.1.  Appreciate hematology/oncology input and recommendations.

## 2021-11-08 NOTE — Progress Notes (Addendum)
Subjective: 4 Days Post-Op Procedure(s) (LRB): TOTAL HIP ARTHROPLASTY ANTERIOR APPROACH (Left) Patient reports pain as 3 on 0-10 scale.   Denies CP or SOB.  Voiding without difficulty. Positive flatus. Objective: Vital signs in last 24 hours: Temp:  [97.8 F (36.6 C)-98.2 F (36.8 C)] 98 F (36.7 C) (10/21 0520) Pulse Rate:  [95-108] 108 (10/21 0520) Resp:  [16-17] 17 (10/21 0520) BP: (104-133)/(57-88) 133/88 (10/21 0520) SpO2:  [90 %-93 %] 90 % (10/21 0520)  Intake/Output from previous day: 10/20 0701 - 10/21 0700 In: 2228.7 [P.O.:450; I.V.:1633; IV Piggyback:145.7] Out: 750 [Urine:750] Intake/Output this shift: No intake/output data recorded.  Recent Labs    11/05/21 1918 11/06/21 0344 11/07/21 0329 11/08/21 0401  HGB 9.0* 9.9* 8.7* 9.9*   Recent Labs    11/07/21 0329 11/08/21 0401  WBC 56.3* 45.1*  RBC 2.54* 2.93*  HCT 26.7* 30.8*  PLT 261 284   Recent Labs    11/07/21 0329 11/08/21 0401  NA 134* 136  K 3.7 3.6  CL 106 108  CO2 19* 21*  BUN 25* 27*  CREATININE 2.12* 2.04*  GLUCOSE 97 87  CALCIUM 7.5* 7.8*   Recent Labs    11/08/21 0401  INR >10.0*    Neurologically intact Neurovascular intact Dorsiflexion/Plantar flexion intact Incision: dressing C/D/I Compartment soft mild swelling  Assessment/Plan:  4 Days Post-Op Procedure(s) (LRB): TOTAL HIP ARTHROPLASTY ANTERIOR APPROACH (Left) Advance diet Up with therapy   Principal Problem:   S/P total left hip arthroplasty Active Problems:   Chronic atrial fibrillation with RVR (HCC)   Essential thrombocythemia (HCC)   Coronary artery disease involving native heart without angina pectoris   Acute anemia   AKI (acute kidney injury) ([place])   Mixed diabetic hyperlipidemia associated with type 2 diabetes mellitus (HCC)   Hypomagnesemia   Acute metabolic encephalopathy   Elevated brain natriuretic peptide (BNP) level   Malnutrition of moderate degree  WBC down Patient alert and  appropriate Cr improved. UO good INR up. Repeat per heme Appreciate Hematology input    Johnn Hai 11/08/2021, '@NOW'$ 

## 2021-11-08 NOTE — Progress Notes (Signed)
I am not sure as to why he is encephalopathic.  I know he has a atrial fibrillation.  He had been off anticoagulation for a couple days because of the surgery.  He is back on anticoagulation right now.  Maybe, we need to do a CT of the brain to make sure there is nothing going on up there.  He had ultrasound that was done which showed some suggestion of cirrhosis of the liver.  His ammonia was 19 this morning.  I do not believe that his INR is over 10.  This must be a mistake.  He is not bleeding.  He is not on Coumadin.  I cannot imagine that he has some type of inhibitor.  I would have to repeat the PT/INR.  He did have the echocardiogram done yesterday.  He has a very high BNP.  Echocardiogram showed an ejection fraction of 35 to 40%.  He has had quadruple bypass.  He has had valve repair.  Again, I am unsure as to why he has this encephalopathy.  Again he was totally oriented before having surgery.  His white cell count is coming down slowly.  Today, his white count is 45.1.  Hemoglobin 9.9.  Platelet count 284,000.  I do not think there is any obvious infection that could be causing this change in mental status.  He is very pleasant.  He does answer some questions appropriately.  He does not know where he is.  He does not know the date or the day of the week.  He did know that he had surgery for his left hip.  His vital signs show temperature of 98.  Pulse 108.  Blood pressure 133/88.  His head and neck exam is unremarkable.  There is no scleral icterus.  He has no oral lesions.  There is no adenopathy in the neck.  Lungs are clear bilaterally.  Cardiac exam is tachycardic and irregular.  Abdomen is soft.  He has decent bowel sounds.  His extremities shows the surgical scar that is dressed in the left lateral hip.  This is nice and dry.  Skin exam does not show any ecchymoses.  Neurological exam shows no focal neurological deficits.  He does have the disorientation.  Again, this change in  mental status is a real problem.  I am not sure as to why he would have this.  I would not think this is anything related to his underlying hematologic disease.  I think that a CT of the brain would not be a bad idea.  Hopefully he will be able to cooperate for the study.  We need to repeat the PT/INR.  Again, he is quite pleasant.  Hopefully, this will just resolve.  I cannot imagine this is anything related to his surgery.  We will continue to follow along and try to help out.  I know he is getting incredible care from everybody on [address].  Lattie Haw, MD  1 Timothy 1:14
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
## 2021-11-08 NOTE — Assessment & Plan Note (Addendum)
   Poor oral intake with evidence of fat depletion and weight loss consistent with severe protein calorie malnutrition  Poor nutritional status is contributing to profound anasarca/third spacing  Nutrition following, their input is appreciated  Nutritional supplements initiated  Multivitamin  Patient s/p IV albumin every 6 hours x 24 hours.

## 2021-11-09 ENCOUNTER — Inpatient Hospital Stay (HOSPITAL_COMMUNITY): Payer: Medicare Other

## 2021-11-09 DIAGNOSIS — I5023 Acute on chronic systolic (congestive) heart failure: Secondary | ICD-10-CM

## 2021-11-09 DIAGNOSIS — D72829 Elevated white blood cell count, unspecified: Secondary | ICD-10-CM

## 2021-11-09 DIAGNOSIS — G9341 Metabolic encephalopathy: Secondary | ICD-10-CM | POA: Diagnosis not present

## 2021-11-09 DIAGNOSIS — N138 Other obstructive and reflux uropathy: Secondary | ICD-10-CM | POA: Diagnosis present

## 2021-11-09 DIAGNOSIS — D649 Anemia, unspecified: Secondary | ICD-10-CM | POA: Diagnosis not present

## 2021-11-09 DIAGNOSIS — Z96642 Presence of left artificial hip joint: Secondary | ICD-10-CM | POA: Diagnosis not present

## 2021-11-09 LAB — COMPREHENSIVE METABOLIC PANEL
ALT: 13 U/L (ref 0–44)
AST: 49 U/L — ABNORMAL HIGH (ref 15–41)
Albumin: 2.4 g/dL — ABNORMAL LOW (ref 3.5–5.0)
Alkaline Phosphatase: 94 U/L (ref 38–126)
Anion gap: 10 (ref 5–15)
BUN: 25 mg/dL — ABNORMAL HIGH (ref 8–23)
CO2: 23 mmol/L (ref 22–32)
Calcium: 7.6 mg/dL — ABNORMAL LOW (ref 8.9–10.3)
Chloride: 104 mmol/L (ref 98–111)
Creatinine, Ser: 1.87 mg/dL — ABNORMAL HIGH (ref 0.61–1.24)
GFR, Estimated: 39 mL/min — ABNORMAL LOW (ref >60)
Glucose, Bld: 94 mg/dL (ref 70–99)
Potassium: 2.8 mmol/L — ABNORMAL LOW (ref 3.5–5.1)
Sodium: 137 mmol/L (ref 135–145)
Total Bilirubin: 1.9 mg/dL — ABNORMAL HIGH (ref 0.3–1.2)
Total Protein: 5.1 g/dL — ABNORMAL LOW (ref 6.5–8.1)

## 2021-11-09 LAB — MAGNESIUM: Magnesium: 1.7 mg/dL (ref 1.7–2.4)

## 2021-11-09 LAB — CBC WITH DIFFERENTIAL/PLATELET
Abs Immature Granulocytes: 5.48 10*3/uL — ABNORMAL HIGH (ref 0.00–0.07)
Basophils Absolute: 0.8 10*3/uL — ABNORMAL HIGH (ref 0.0–0.1)
Basophils Relative: 2 %
Eosinophils Absolute: 0.2 10*3/uL (ref 0.0–0.5)
Eosinophils Relative: 0 %
HCT: 29.8 % — ABNORMAL LOW (ref 39.0–52.0)
Hemoglobin: 9.5 g/dL — ABNORMAL LOW (ref 13.0–17.0)
Immature Granulocytes: 11 %
Lymphocytes Relative: 3 %
Lymphs Abs: 1.3 10*3/uL (ref 0.7–4.0)
MCH: 33.7 pg (ref 26.0–34.0)
MCHC: 31.9 g/dL (ref 30.0–36.0)
MCV: 105.7 fL — ABNORMAL HIGH (ref 80.0–100.0)
Monocytes Absolute: 6.4 10*3/uL — ABNORMAL HIGH (ref 0.1–1.0)
Monocytes Relative: 13 %
Neutro Abs: 35.9 10*3/uL — ABNORMAL HIGH (ref 1.7–7.7)
Neutrophils Relative %: 71 %
Platelets: 274 10*3/uL (ref 150–400)
RBC: 2.82 MIL/uL — ABNORMAL LOW (ref 4.22–5.81)
RDW: 23.5 % — ABNORMAL HIGH (ref 11.5–15.5)
WBC: 50 10*3/uL — ABNORMAL HIGH (ref 4.0–10.5)
nRBC: 2.9 % — ABNORMAL HIGH (ref 0.0–0.2)

## 2021-11-09 LAB — ECHOCARDIOGRAM LIMITED
AV Mean grad: 8 mmHg
AV Peak grad: 15.2 mmHg
Ao pk vel: 1.95 m/s
Area-P 1/2: 4.57 cm2
Calc EF: 42.2 %
Height: 66 in
MV M vel: 5.05 m/s
MV Peak grad: 101.8 mmHg
S' Lateral: 4.2 cm
Single Plane A2C EF: 37.7 %
Single Plane A4C EF: 45.5 %
Weight: 2672.01 oz

## 2021-11-09 LAB — BRAIN NATRIURETIC PEPTIDE: B Natriuretic Peptide: 1384.2 pg/mL — ABNORMAL HIGH (ref 0.0–100.0)

## 2021-11-09 LAB — TECHNOLOGIST SMEAR REVIEW

## 2021-11-09 LAB — BASIC METABOLIC PANEL
Anion gap: 9 (ref 5–15)
BUN: 23 mg/dL (ref 8–23)
CO2: 28 mmol/L (ref 22–32)
Calcium: 7.9 mg/dL — ABNORMAL LOW (ref 8.9–10.3)
Chloride: 102 mmol/L (ref 98–111)
Creatinine, Ser: 1.76 mg/dL — ABNORMAL HIGH (ref 0.61–1.24)
GFR, Estimated: 42 mL/min — ABNORMAL LOW (ref >60)
Glucose, Bld: 90 mg/dL (ref 70–99)
Potassium: 2.5 mmol/L — CL (ref 3.5–5.1)
Sodium: 139 mmol/L (ref 135–145)

## 2021-11-09 LAB — PROTIME-INR
INR: 3.3 — ABNORMAL HIGH (ref 0.8–1.2)
Prothrombin Time: 33.2 seconds — ABNORMAL HIGH (ref 11.4–15.2)

## 2021-11-09 LAB — RPR: RPR Ser Ql: NONREACTIVE

## 2021-11-09 LAB — PROCALCITONIN: Procalcitonin: 0.31 ng/mL

## 2021-11-09 LAB — APTT: aPTT: 45 seconds — ABNORMAL HIGH (ref 24–36)

## 2021-11-09 MED ORDER — MAGNESIUM SULFATE IN D5W 1-5 GM/100ML-% IV SOLN
1.0000 g | Freq: Once | INTRAVENOUS | Status: AC
  Administered 2021-11-09: 1 g via INTRAVENOUS
  Filled 2021-11-09: qty 100

## 2021-11-09 MED ORDER — PERFLUTREN LIPID MICROSPHERE
1.0000 mL | INTRAVENOUS | Status: AC | PRN
  Administered 2021-11-09: 2 mL via INTRAVENOUS

## 2021-11-09 MED ORDER — QUETIAPINE FUMARATE 50 MG PO TABS
25.0000 mg | ORAL_TABLET | Freq: Every day | ORAL | Status: AC
  Administered 2021-11-09 – 2021-11-11 (×3): 25 mg via ORAL
  Filled 2021-11-09 (×3): qty 1

## 2021-11-09 MED ORDER — POTASSIUM CHLORIDE 10 MEQ/100ML IV SOLN
10.0000 meq | INTRAVENOUS | Status: AC
  Administered 2021-11-09 (×4): 10 meq via INTRAVENOUS
  Filled 2021-11-09 (×4): qty 100

## 2021-11-09 MED ORDER — LACTULOSE 10 GM/15ML PO SOLN
20.0000 g | Freq: Two times a day (BID) | ORAL | Status: DC
  Administered 2021-11-09 – 2021-11-10 (×3): 20 g via ORAL
  Filled 2021-11-09 (×3): qty 30

## 2021-11-09 MED ORDER — PHYTONADIONE 5 MG PO TABS
5.0000 mg | ORAL_TABLET | Freq: Once | ORAL | Status: AC
  Administered 2021-11-09: 5 mg via ORAL
  Filled 2021-11-09: qty 1

## 2021-11-09 MED ORDER — POTASSIUM CHLORIDE CRYS ER 20 MEQ PO TBCR
40.0000 meq | EXTENDED_RELEASE_TABLET | ORAL | Status: AC
  Administered 2021-11-09 (×2): 40 meq via ORAL
  Filled 2021-11-09 (×3): qty 2

## 2021-11-09 MED ORDER — QUETIAPINE FUMARATE 25 MG PO TABS: 25.0000 mg | ORAL_TABLET | Freq: Every day | ORAL | Status: DC

## 2021-11-09 NOTE — Progress Notes (Signed)
Subjective: Slightly worse today  Exam: Vitals:   11/09/21 0900 11/09/21 1405  BP:  112/63  Pulse:  97  Resp:  17  Temp:  97.9 F (36.6 C)  SpO2: 94% 93%   Gen: In bed, NAD Resp: non-labored breathing, no acute distress Abd: soft, nt  Neuro: MS: Awake, oriented to month, but unable to give the year or location. He thinks his wife is in the room when she has already stepped out.  CN: EOMI, no nystagmus Motor: Moves all extremities well, he does have mild asterixis today. Sensory: Intact to light touch  Pertinent Labs:  Sodium 137 Calcium 7.6, albumin 2.4 Creatinine 1.87 with GFR 39 ESR 0 RPR negative TSH  4.9 Folate nml B12 3470 Abg with mild hypoxemia. O2 sats 93 today   Impression: 66 year old male with what I suspect is a multifactorial delirium.  He did receive Benadryl overnight, and anticholinergic medications can play a significant role.  I would favor discontinuing this.  He apparently did have some trouble sleeping last night therefore I will start him on a low-dose of Seroquel to maintain sleep/wake cycle.  There has been some suggestion of hepatic dysfunction including elevated INR, but his ammonia has only been 19.  Given the asterixis, I would favor giving this a try.   Recommendations: 1)Seroquel, 64m q8pm 2) Lactualose 20g BID 3) will continue to follow.   MRoland Rack MD [facility] [phone] If 7pm- 7am, please page neurology on call as listed in [facility]

## 2021-11-09 NOTE — Progress Notes (Signed)
[facility] ROUNDING NOTE   Subjective: Improved cognition on exam today.  More conversive and more awareness of recent clinical events.  Tolerated dinner last evening and breakfast this morning without issue.  No reports of overt GI bleed.  Completed MRI brain yesterday with asymmetrically decreased signal in left sigmoid sinus with absent flow void which may represent slow flow or occlusion.  TTE completed earlier today with interpretation pending.  INR 3.3, H/H stable at 9.5/29.8.  Slight improvement in renal function with creatinine 1.87.  PCT downtrending.   Objective: Vital signs in last 24 hours: Temp:  [97.2 F (36.2 C)-98.5 F (36.9 C)] 97.2 F (36.2 C) (10/22 0459) Pulse Rate:  [74-104] 98 (10/22 0459) Resp:  [18-20] 18 (10/22 0459) BP: (97-128)/(58-72) 119/66 (10/22 0459) SpO2:  [83 %-100 %] 94 % (10/22 0900) Last BM Date : 11/08/21 General: NAD Lungs:  CTA b/l, no w/r/r Heart: 3/6 SEM, RRR Abdomen:  Soft, NT, ND, +BS   Intake/Output from previous day: 10/21 0701 - 10/22 0700 In: 905.5 [P.O.:700; I.V.:96.4; IV Piggyback:109.1] Out: 4950 [Urine:4950] Intake/Output this shift: No intake/output data recorded.   Lab Results: Recent Labs    11/07/21 0329 11/08/21 0401 11/09/21 0347  WBC 56.3* 45.1* 50.0*  HGB 8.7* 9.9* 9.5*  PLT 261 284 274  MCV 105.1* 105.1* 105.7*   BMET Recent Labs    11/07/21 0329 11/08/21 0401 11/09/21 0347  NA 134* 136 137  K 3.7 3.6 2.8*  CL 106 108 104  CO2 19* 21* 23  GLUCOSE 97 87 94  BUN 25* 27* 25*  CREATININE 2.12* 2.04* 1.87*  CALCIUM 7.5* 7.8* 7.6*   LFT Recent Labs    11/07/21 0329 11/08/21 0401 11/09/21 0347  PROT 5.0* 5.1* 5.1*  ALBUMIN 2.4* 2.5* 2.4*  AST 70* 62* 49*  ALT '15 15 13  '$ ALKPHOS 76 91 94  BILITOT 1.1 1.8* 1.9*   PT/INR Recent Labs    11/08/21 0806 11/09/21 0347  INR >10.0* 3.3*      Imaging/Other results: MR BRAIN WO CONTRAST  Result Date: 11/08/2021 CLINICAL  DATA:  Neuro deficit.  Acute stroke suspected. EXAM: MRI HEAD WITHOUT CONTRAST TECHNIQUE: Multiplanar, multiecho pulse sequences of the brain and surrounding structures were obtained without intravenous contrast. COMPARISON:  Same-day CT head FINDINGS: Brain: No acute infarction, hydrocephalus, extra-axial collection or mass lesion. Assessment for the presence of intracranial hemorrhage is markedly limited due to the presence of extensive susceptibility artifact on susceptibility weighted imaging, presumably due to patient's recent iron infusion. Vascular: Normal arterial flow voids. Although contrast was not administered, there is a background of vascular signal due to patient's recent iron infusion. There is asymmetrically decreased signal within the left sigmoid sinus (series 6, image 41) with an absent flow void on T2 weighted imaging (series 9, image 7). Skull and upper cervical spine: Normal marrow signal. Sinuses/Orbits: Negative. Other: None. IMPRESSION: 1. No acute intracranial abnormality. 2. Asymmetrically decreased signal within the left sigmoid sinus with an absent flow void on T2 weighted imaging. This may represent slow flow or occlusion. If there is concern for dural venous sinus thrombosis, further evaluation with a CT venogram is recommended. Electronically Signed   By: Marin Roberts M.D.   On: 11/08/2021 15:37   DG CHEST PORT 1 VIEW  Result Date: 11/08/2021 CLINICAL DATA:  [id number] Acute respiratory failure with hypoxia [facility]) [id number] EXAM: PORTABLE CHEST 1 VIEW COMPARISON:  November 05, 2021 FINDINGS: The cardiomediastinal silhouette is unchanged and enlarged in contour.Status post median  sternotomy and CABG. Atherosclerotic calcifications of the aorta. Bilateral pleural effusions. No pneumothorax. Diffuse interstitial prominence with perihilar vascular fullness and vascular indistinctness, mildly increased. Retrocardiac opacity, likely atelectasis. Visualized abdomen is unremarkable. IMPRESSION:  Mildly increased pulmonary edema with small bilateral pleural effusions and favored bibasilar atelectasis. Electronically Signed   By: Valentino Saxon M.D.   On: 11/08/2021 10:44   CT HEAD WO CONTRAST (5MM)  Result Date: 11/08/2021 CLINICAL DATA:  Mental status changes, unknown cause. Atrial fibrillation. Status post hip surgery. EXAM: CT HEAD WITHOUT CONTRAST TECHNIQUE: Contiguous axial images were obtained from the base of the skull through the vertex without intravenous contrast. RADIATION DOSE REDUCTION: This exam was performed according to the departmental dose-optimization program which includes automated exposure control, adjustment of the mA and/or kV according to patient size and/or use of iterative reconstruction technique. COMPARISON:  Head CT dated 10/04/2019. FINDINGS: Brain: There is mild global atrophy, mild atrophic ventriculomegaly and mild small vessel disease of the cerebral white matter, all with a slight progression from 2021. No asymmetry is seen concerning for an acute infarct, hemorrhage or mass. There is no midline shift. Basal cisterns are clear. No old infarct has developed in the interval. Vascular: The carotid siphons are heavily calcified. No hyperdense central vessel is seen. Scattered calcification V4 right vertebral artery. Skull: Negative for fractures or focal lesions. Sinuses/Orbits: Old lens extractions, otherwise unremarkable orbital contents. Visualized sinuses and mastoid air cells are clear. Other: None. IMPRESSION: 1. No acute intracranial CT findings. 2. Mild atrophy and small-vessel disease, but with progression since 2021. 3. Heavy carotid atherosclerosis. Electronically Signed   By: Telford Nab M.D.   On: 11/08/2021 07:42   US Abdomen Limited RUQ (LIVER/GB)  Result Date: 11/07/2021 CLINICAL DATA:  Cirrhosis EXAM: ULTRASOUND ABDOMEN LIMITED RIGHT UPPER QUADRANT COMPARISON:  CT 10/04/2019 FINDINGS: Gallbladder: Cholelithiasis with multiple tiny stones  layering in the gallbladder. Mild pericholecystic edema without wall thickening. Murphy's sign is negative. Appearances are similar to the previous CT scan. Common bile duct: Diameter: 3 mm, normal Liver: Coarsened liver echotexture with nodular contour consistent with history of cirrhosis. No focal lesions identified. Portal vein is patent on color Doppler imaging with normal direction of blood flow towards the liver. Other: Upper abdominal ascites is demonstrated. A small right pleural effusion is demonstrated. IMPRESSION: 1. Cholelithiasis. Mild pericholecystic edema is likely related to liver disease. 2. Nodular contour to the liver consistent with history of cirrhosis. 3. Upper abdominal ascites and small right pleural effusion are present. Electronically Signed   By: Lucienne Capers M.D.   On: 11/07/2021 20:11   ECHOCARDIOGRAM COMPLETE  Result Date: 11/07/2021    ECHOCARDIOGRAM REPORT   Patient Name:   Justin [name] [facility] Date of Exam: 11/07/2021 Medical Rec #:  [id number]          Height:       66.0 in Accession #:    [id number]         Weight:       167.0 lb Date of Birth:  Jul 28, 1955          BSA:          1.852 m Patient Age:    66 years           BP:           107/59 mmHg Patient Gender: M                  HR:  90 bpm. Exam Location:  Inpatient Procedure: 2D Echo, Cardiac Doppler and Color Doppler Indications:    CHF  History:        Patient has prior history of Echocardiogram examinations. CAD,                 Prior CABG; Risk Factors:Hypertension, Diabetes and                 Dyslipidemia.  Sonographer:    Danne Baxter RDCS, FE, PE Referring Phys: [id number] [place]  1. Poor acoustic windows. Diffiuclt to see endocardium Septal hypokinesis Would recomm limited echo with Definity to confirm/define wall motion and EF.Marland Kitchen Left ventricular ejection fraction, by estimation, is 45 to 50%. The left ventricle has mildly decreased function. The left ventricle demonstrates global  hypokinesis. Left ventricular diastolic parameters are indeterminate.  2. Right ventricular systolic function is moderately reduced. The right ventricular size is normal.  3. Left atrial size was severely dilated.  4. Right atrial size was moderately dilated.  5. MR is eccentric, directed more posterior into LA. . Mild to moderate mitral valve regurgitation. Moderate mitral annular calcification.  6. S/P AVR (23 mm MagnaEase tissue valve (procedure date 01/03/18). Peak and mean gradients through the valve are 25 and 12 mm Hg respectively LVOT gradient does not appear accurate to calculated valve area. Compared to echo from 2020, gradients are unchanged. . The aortic valve has been repaired/replaced. Aortic valve regurgitation is not visualized. Procedure Date: 2019.  7. The inferior vena cava is dilated in size with <50% respiratory variability, suggesting right atrial pressure of 15 mmHg. FINDINGS  Left Ventricle: Poor acoustic windows. Diffiuclt to see endocardium Septal hypokinesis Would recomm limited echo with Definity to confirm/define wall motion and EF. Left ventricular ejection fraction, by estimation, is 45 to 50%. The left ventricle has mildly decreased function. The left ventricle demonstrates global hypokinesis. The left ventricular internal cavity size was normal in size. There is no left ventricular hypertrophy. Left ventricular diastolic parameters are indeterminate. Right Ventricle: The right ventricular size is normal. Right vetricular wall thickness was not assessed. Right ventricular systolic function is moderately reduced. Left Atrium: Left atrial size was severely dilated. Right Atrium: Right atrial size was moderately dilated. Pericardium: There is no evidence of pericardial effusion. Mitral Valve: MR is eccentric, directed more posterior into LA. There is moderate thickening of the mitral valve leaflet(s). There is mild calcification of the mitral valve leaflet(s). Moderate mitral annular  calcification. Mild to moderate mitral valve regurgitation. Tricuspid Valve: The tricuspid valve is normal in structure. Tricuspid valve regurgitation is mild. Aortic Valve: S/P AVR (23 mm MagnaEase tissue valve (procedure date 01/03/18). Peak and mean gradients through the valve are 25 and 12 mm Hg respectively LVOT gradient does not appear accurate to calculated valve area. Compared to echo from 2020, gradients are unchanged. The aortic valve has been repaired/replaced. Aortic valve regurgitation is not visualized. Aortic valve mean gradient measures 12.0 mmHg. Aortic valve peak gradient measures 25.4 mmHg. Aortic valve area, by VTI measures 0.56 cm.  There is a 23 mm [employer] valve present in the aortic position. Pulmonic Valve: The pulmonic valve was not well visualized. Pulmonic valve regurgitation is not visualized. No evidence of pulmonic stenosis. Aorta: The aortic root and ascending aorta are structurally normal, with no evidence of dilitation. Venous: The inferior vena cava is dilated in size with less than 50% respiratory variability, suggesting right atrial pressure of 15 mmHg. IAS/Shunts: No atrial  level shunt detected by color flow Doppler.  LEFT VENTRICLE PLAX 2D LVIDd:         5.40 cm LVIDs:         4.60 cm LV PW:         0.90 cm LV IVS:        0.80 cm LVOT diam:     1.70 cm LV SV:         23 LV SV Index:   12 LVOT Area:     2.27 cm  RIGHT VENTRICLE RV S prime:     4.87 cm/s TAPSE (M-mode): 0.7 cm LEFT ATRIUM              Index        RIGHT ATRIUM           Index LA Vol (A2C):   146.0 ml 78.82 ml/m  RA Area:     23.90 cm LA Vol (A4C):   118.0 ml 63.71 ml/m  RA Volume:   78.50 ml  42.38 ml/m LA Biplane Vol: 135.0 ml 72.88 ml/m  AORTIC VALVE AV Area (Vmax):    0.64 cm AV Area (Vmean):   0.63 cm AV Area (VTI):     0.56 cm AV Vmax:           252.00 cm/s AV Vmean:          160.000 cm/s AV VTI:            0.410 m AV Peak Grad:      25.4 mmHg AV Mean Grad:      12.0 mmHg LVOT Vmax:          70.90 cm/s LVOT Vmean:        44.500 cm/s LVOT VTI:          0.101 m LVOT/AV VTI ratio: 0.25  AORTA Ao Root diam: 2.90 cm Ao Asc diam:  3.70 cm TRICUSPID VALVE TR Peak grad:   30.7 mmHg TR Vmax:        277.00 cm/s  SHUNTS Systemic VTI:  0.10 m Systemic Diam: 1.70 cm Dorris Carnes MD Electronically signed by Dorris Carnes MD Signature Date/Time: 11/07/2021/6:41:47 PM    Final       Assessment and Plan:  1) Elevated INR Significant improvement to 3.3 today from > 10 yesterday after p.o. vitamin K.  No overt GI bleed.  Again, suspect multifactorial etiology for elevated INR.  Overall have a lower suspicion that he truly has underlying cirrhosis based on baseline labs and imaging studies. - Continue trending - Was evaluated by inpatient Hematology as well  2) Metabolic encephalopathy Ammonia normal, no asterixis on exam.  Low suspicion for hepatic encephalopathy as underlying etiology.  Based on chart review seemingly slowly decline cognition as outpatient with acute change likely related to multiple recent acute medical events.  CT head without acute intracranial pathology, but MRI brain with possible slow flow vs occlusion. - Neurology service consulted and following - Do not feel strongly that he needs to be started on lactulose  3) Acute on chronic Anemia No overt bleeding and FOBT negative.  H/H stable. Received IV iron earlier this month as outpatient.  Was also treated with outpatient Retacrit weekly - Continue trending - No role for endoscopy at this juncture - If continued anemia, recommend repeat cross-sectional imaging  4) Atrial fibrillation 5) CAD with history of CABG 6) Elevated BNP MRI brain findings as above.  TTE performed earlier today - Management per primary Hospitalist  service  7) GERD 8) History of gastric ulcer 9) History of colon AVMs - Resume PPI - As above, no overt bleeding.  No plan for endoscopy at this juncture.  Conservative management more appropriate  10)  Essential thrombocythemia 11) Progressive chronic leukocytosis - Management per Hematology service  Inpatient GI service will sign off at this time.  Please do not hesitate to contact us with additional questions or concerns.   Lavena Bullion, DO  11/09/2021, 10:57 AM [facility] Pager [phone]

## 2021-11-09 NOTE — Assessment & Plan Note (Addendum)
   Per review of the chart patient has experienced urinary retention secondary to BPH in 2021 prompting temporary placement of a Foley catheter  Patient's Flomax was held for several days after surgery and was resumed on 10/21  Patient has a history of BPH with urinary retention in the past  That being said, dural venous sinus thrombosis is also been known to cause urinary retention  Foley catheter removed on 11/11/2021, patient noted to have failed voiding trial and as such Foley catheter placed back in 11/12/2021.   Discontinued bethanechol due to decreased threshold for seizure.   [facility] s[facility] urology, Dr.Herrick who recommended voiding trial again tomorrow morning 11/19/2021 and if patient fails voiding trial Foley catheter to be placed back in and patient to be discharged with Foley catheter in place with outpatient urology follow-up.  Urology supposedly to arrange outpatient follow-up with patient.

## 2021-11-09 NOTE — Progress Notes (Signed)
Physical Therapy Treatment Patient Details Name: Justin [name] MRN: [id number] DOB: [birth year] Today's Date: 11/09/2021   History of Present Illness 66 y.o. male s/p Left THA on 11/04/21.  PMHx: A.Fib on Eliquis, CAD s/p CABG, AS s/p AVR with bioprosthetic, s/p CEA, DM2, HTN, essential thrombocytosis/JAK2+ chronic leukocytosis    PT Comments    Pt very cooperative this pm and following commands with minimal difficulty.  Pt performed bed therex program and up to EOB sitting and to standing to perform hygiene post bowel incontinence before step pvt to recliner.  Mobility limited this date by c/o increasing dizziness - BP supine 112/63; sitting 106/67; standing 102/59; and after transfer to chair 91/61- RN aware - BP 109/63 after sitting 1 minute in recliner with legs elevated.  Recommendations for follow up therapy are one component of a multi-disciplinary discharge planning process, led by the attending physician.  Recommendations may be updated based on patient status, additional functional criteria and insurance authorization.  Follow Up Recommendations  Follow physician's recommendations for discharge plan and follow up therapies     Assistance Recommended at Discharge Frequent or constant Supervision/Assistance  Patient can return home with the following Help with stairs or ramp for entrance;A lot of help with walking and/or transfers;A little help with bathing/dressing/bathroom;Assistance with cooking/housework;Assist for transportation   Equipment Recommendations  Rolling walker (2 wheels)    Recommendations for Other Services       Precautions / Restrictions Precautions Precautions: Fall Precaution Comments: supplemental oxygen Restrictions Weight Bearing Restrictions: No     Mobility  Bed Mobility Overal bed mobility: Needs Assistance Bed Mobility: Supine to Sit     Supine to sit: Min assist     General bed mobility comments: Increased time with cues for  sequence and use of R LE to self assist.    Transfers Overall transfer level: Needs assistance Equipment used: Rolling walker (2 wheels) Transfers: Sit to/from Stand, Bed to chair/wheelchair/BSC Sit to Stand: Min assist, +2 safety/equipment   Step pivot transfers: Min assist, +2 safety/equipment       General transfer comment: cues for LE management and use of UEs to self assist;  Physical assist for balance and for RW with step pvt bed to chair    Ambulation/Gait               General Gait Details: step pvt bed to chair only 2* pt report of increasing dizziness   Stairs             Wheelchair Mobility    Modified Rankin (Stroke Patients Only)       Balance Overall balance assessment: Needs assistance Sitting-balance support: No upper extremity supported, Feet supported Sitting balance-Leahy Scale: Fair     Standing balance support: Bilateral upper extremity supported Standing balance-Leahy Scale: Poor                              Cognition Arousal/Alertness: Awake/alert Behavior During Therapy: WFL for tasks assessed/performed Overall Cognitive Status: Within Functional Limits for tasks assessed                                 General Comments: Pt following all cues this pm and answering questions appropriately        Exercises Total Joint Exercises Ankle Circles/Pumps: AROM, Both, 15 reps, Supine Quad Sets: AROM, Both, 10 reps, Supine Heel Slides: AAROM,  Left, 20 reps, Supine Hip ABduction/ADduction: AAROM, Left, 15 reps, Supine    General Comments        Pertinent Vitals/Pain Pain Assessment Pain Assessment: Faces Faces Pain Scale: Hurts a little bit Pain Location: left hip Pain Descriptors / Indicators: Aching, Sore Pain Intervention(s): Limited activity within patient's tolerance, Monitored during session    Home Living                          Prior Function            PT Goals (current  goals can now be found in the care plan section) Acute Rehab PT Goals Patient Stated Goal: get OOB PT Goal Formulation: With patient Time For Goal Achievement: 11/12/21 Potential to Achieve Goals: Good Progress towards PT goals: Not progressing toward goals - comment (orthostatic)    Frequency    7X/week      PT Plan Current plan remains appropriate    Co-evaluation              AM-PAC PT "6 Clicks" Mobility   Outcome Measure  Help needed turning from your back to your side while in a flat bed without using bedrails?: A Lot Help needed moving from lying on your back to sitting on the side of a flat bed without using bedrails?: A Lot Help needed moving to and from a bed to a chair (including a wheelchair)?: A Lot Help needed standing up from a chair using your arms (e.g., wheelchair or bedside chair)?: A Lot Help needed to walk in hospital room?: Total Help needed climbing 3-5 steps with a railing? : Total 6 Click Score: 10    End of Session Equipment Utilized During Treatment: Gait belt Activity Tolerance: Patient limited by fatigue;Other (comment) (orthostatic) Patient left: in chair;with call bell/phone within reach;with chair alarm set Nurse Communication: Mobility status PT Visit Diagnosis: Other abnormalities of gait and mobility (R26.89)     Time: 1410-1435 PT Time Calculation (min) (ACUTE ONLY): 25 min  Charges:  $Therapeutic Exercise: 8-22 mins $Therapeutic Activity: 8-22 mins                     Debe Coder PT Acute Rehabilitation Services Pager [phone] Office [phone]    Justin [name] 11/09/2021, 3:48 PM

## 2021-11-09 NOTE — Progress Notes (Signed)
  Echocardiogram 2D Echocardiogram has been performed.  Lana Fish 11/09/2021, 8:53 AM

## 2021-11-09 NOTE — Progress Notes (Signed)
PROGRESS NOTE   Justin [name]  VOZ:[id number] DOB: [birth year] DOA: 11/04/2021 PCP: [name], MD   Date of Service: the patient was seen and examined on 11/09/2021  Brief Narrative:  66 y.o. male with history of essential thrombocythemia, paroxysmal atrial fibrillation on Eliquis, coronary artery disease status post CABG history of functional disability to the left hip secondary to end-stage arthritis, failed outpatient nonsurgical conservative treatment greater than 12 weeks ago was admitted to the orthopedic service for left total hip arthroplasty.    Patient noted to have had significant blood loss in the perioperative period and received 2 units packed transfusions perioperatively on 10/17.  Patient initiated on tranexamic acid by orthopedic surgery, admitted to the hospital with hospitalist group consulted for assistance with suspected pulmonary edema in addition to assistance with management of patient's atrial fibrillation, diabetes, coronary artery disease.    Patient continued to exhibit substantial anemia on 10/18 requiring an additional 2 units of packed red blood cells transfused.  Hospitalization 42/59 was complicated by worsening leukocytosis and the development of encephalopathy concerning for an underlying infectious process.  Patient was placed on empiric antibiotic therapy.  Hospital course also complicated by acute kidney injury.   Assessment and Plan: * S/P total left hip arthroplasty Status post left hip arthroplasty 10/17 with Dr. Alvan Dame Per orthopedic surgery, patient to be weightbearing as tolerated at this point PT, OT evaluations As needed opiate-based analgesics   Acute metabolic encephalopathy Persisting likely multifactorial etiology Persisting despite the discontinuation of opiate-based analgesics and Ambien It seems patient received Benadryl as well, this is been discontinued B12 and folate levels unremarkable. Ammonia obtained morning of 10/21  unremarkable despite concerns for possible cirrhosis CT head and MRI brain essentially unremarkable TSH slightly elevated likely secondary to euthyroid sick VBG and ABG reveals no evidence of hypercapnia Patient is being evaluated daily by Dr. Leonel Ramsay with neurology post to place the patient on who has placed the patient on a trial regimen of lactulose as well as a course of Seroquel to help with the patient's sleep/wake cycle.  His input is appreciated.  AKI (acute kidney injury) ([place]) Patient's acute kidney injury has continued to improve with diuresis multifactorial.   Patient has a known history of urinary retention secondary to BPH in the past therefore Foley catheter was placed on 10/21 after patient has been identified to have recurrent bouts of urinary retention.  Patient's home regimen of Flomax has been resumed. Patient should have Foley catheter discontinued with voiding trial prior to discharge.  If this fails can consider urology consultation. Zosyn administered for possible urinary tract infection earlier in the hospitalization has been discontinued Minimizing use of nephrotoxic agents Monitoring renal function and electrolytes with serial chemistry Strict input and output monitoring  Acute on chronic systolic CHF (congestive heart failure) (HCC) Elevated on 10/20 at 1749 Echocardiogram revealing slightly decreased ejection fraction of 45 to 50% revealing some mild systolic dysfunction which was confirmed via focused echo on 10/22. Continuing short course of intravenous Lasix twice daily with continued excellent diuretic response Consider cardiology consultation if patient worsens -Per my discussion with the 10/21  patient has been experiencing increasing shortness of breath for at least the past month even prior to his hip surgery.  Acute anemia Hemoglobin now stable Known history of essential thrombocytopenia on Hydrea No clinical evidence of bleeding Total of 4 units  of packed red blood cells transfused during this hospitalization on POD 0 and 1 Continuing to hold Eliquis since 10/19 due  to markedly elevated INR MCV suggests a macrocytic anemia however vitamin B12 and folate from 08/2021 are unremarkable. Monitoring hemoglobin and hematocrit with serial CBCs Hematology additionally following, their input is appreciated  Elevated INR Profoundly elevated INR greater than 10 confirmed with repeat testing Substantial improvement in INR with administration of 5 mg of vitamin K on 10/21.  We will repeat a dose today. No clinical evidence of bleeding Clinical picture not consistent with DIC Right upper quadrant ultrasound suggestive of cirrhosis.   Dr. Bryan Lemma with gastroenterology was consulted who is not entirely convinced the patient has true cirrhosis.  His input is appreciated.   Furthermore, appreciate Dr. Antonieta Pert input on this apparent coagulopathy Doing to hold Eliquis for now - which can cause an elevated INR but typically not greater than 2  Type 2 diabetes mellitus without complication, without long-term current use of insulin (HCC)-resolved as of 11/06/2021 Patient been placed on Accu-Cheks before every meal and nightly with sliding scale insulin Holding home regimen of hypoglycemics Hemoglobin A1C 5.0% Diabetic Diet   BPH with obstruction/lower urinary tract symptoms Per my review of the chart patient has experienced urinary retention secondary to BPH in 2021 prompting temporary placement of a Foley catheter Foley catheter has been placed during this hospitalization as well Home regimen of Flomax was subsequently resumed on 10/21 Patient will likely need Foley catheter removed prior to discharge with voiding trial.  If patient fails his voiding trial urology consultation can be considered  Chronic atrial fibrillation with RVR ([place]) Improved rate control Continue home regimen of metoprolol as blood pressure tolerates Monitoring patient on  telemetry Eliquis resumed 10/19 but now discontinued since 10/21 due to markedly elevated INR.  Leukocytosis Cultures negative thus far Substantial leukocytosis downtrending Patient received a 3-day course of intravenous Zosyn which was subsequently discontinued on 10/21 due to no evidence of infection Obtaining peripheral smear  Coronary artery disease involving native heart without angina pectoris Continuing metoprolol, statin therapy Patient is currently chest pain-free Monitoring patient on telemetry  Malnutrition of moderate degree Poor oral intake with evidence of fat depletion and weight loss consistent with moderate protein calorie malnutrition Nutrition following, their input is appreciated Nutritional supplements initiated Multivitamin  Essential thrombocythemia ([place]) No significant thrombocytopenia at this time Dr. Marin Olp with hematology oncology following, his input is appreciated.  Hypomagnesemia Given 1 g intravenous magnesium sulfate today   Mixed diabetic hyperlipidemia associated with type 2 diabetes mellitus ([place]) Continuing home regimen of lipid lowering therapy.        Subjective:  Patient complains of intermittent left hip pain, mild to moderate intensity, sharp in quality, worse with movement of the affected extremity.  Otherwise patient denies shortness of breath or chest pain.  Physical Exam:  Vitals:   11/08/21 2213 11/09/21 0459 11/09/21 0900 11/09/21 1405  BP: (!) 97/58 119/66  112/63  Pulse: 95 98  97  Resp: '18 18  17  '$ Temp: 98.5 F (36.9 C) (!) 97.2 F (36.2 C)  97.9 F (36.6 C)  TempSrc: Oral Oral  Oral  SpO2: 95% 95% 94% 93%  Weight:      Height:        Constitutional: Awake alert and oriented x 1, no associated distress.   Skin: no rashes, no lesions, good skin turgor noted. Eyes: Pupils are equally reactive to light.  No evidence of scleral icterus or conjunctival pallor.  ENMT: Moist mucous membranes noted.  Posterior  pharynx clear of any exudate or lesions.   Respiratory: Mild bibasilar  rales without any evidence of wheezing.  Normal respiratory effort. No accessory muscle use.  Cardiovascular: Irregularly irregular rhythm with controlled rate no murmurs / rubs / gallops. No extremity edema. 2+ pedal pulses. No carotid bruits.  Abdomen: Abdomen is soft and nontender.  No evidence of intra-abdominal masses.  Positive bowel sounds noted in all quadrants.   Musculoskeletal: Some pain with passive and active range of motion of the left hip.  No joint deformity upper and lower extremities.  no contractures. Normal muscle tone.    Data Reviewed:  I have personally reviewed and interpreted labs, imaging.  Significant findings are   CBC: Recent Labs  Lab 11/03/21 1000 11/04/21 1435 11/05/21 1918 11/06/21 0344 11/07/21 0329 11/08/21 0401 11/09/21 0347  WBC 22.3*   < > 46.3* 63.8* 56.3* 45.1* 50.0*  NEUTROABS 19.0*  --   --   --  45.2* 37.9* 35.9*  HGB 11.8*   < > 9.0* 9.9* 8.7* 9.9* 9.5*  HCT 35.8*   < > 27.6* 30.5* 26.7* 30.8* 29.8*  MCV 110.2*   < > 103.8* 104.5* 105.1* 105.1* 105.7*  PLT 203   < > 226 254 261 284 274   < > = values in this interval not displayed.   Basic Metabolic Panel: Recent Labs  Lab 11/05/21 0347 11/06/21 0344 11/07/21 0329 11/08/21 0401 11/09/21 0347 11/09/21 1555  NA 141 136 134* 136 137 139  K 3.1* 4.7 3.7 3.6 2.8* 2.5*  CL 111 106 106 108 104 102  CO2 22 21* 19* 21* 23 28  GLUCOSE 118* 124* 97 87 94 90  BUN 15 20 25* 27* 25* 23  CREATININE 1.40* 2.15* 2.12* 2.04* 1.87* 1.76*  CALCIUM 7.5* 8.1* 7.5* 7.8* 7.6* 7.9*  MG 1.5* 2.0 1.8 1.9 1.7  --    GFR: Estimated Creatinine Clearance: 36.8 mL/min (A) (by C-G formula based on SCr of 1.76 mg/dL (H)). Liver Function Tests: Recent Labs  Lab 11/05/21 0347 11/06/21 0344 11/07/21 0329 11/08/21 0401 11/09/21 0347  AST 29 59* 70* 62* 49*  ALT '12 13 15 15 13  '$ ALKPHOS 61 84 76 91 94  BILITOT 0.9 1.1 1.1 1.8*  1.9*  PROT 4.9* 5.8* 5.0* 5.1* 5.1*  ALBUMIN 2.5* 2.9* 2.4* 2.5* 2.4*    Coagulation Profile: Recent Labs  Lab 11/08/21 0401 11/08/21 0806 11/09/21 0347  INR >10.0* >10.0* 3.3*     EKG/Telemetry: Personally reviewed.  Rhythm is atrial fibrillation with heart rate of 102 bpm.  No dynamic ST segment changes appreciated.   Code Status:  Full code    Severity of Illness:  The appropriate patient status for this patient is INPATIENT. Inpatient status is judged to be reasonable and necessary in order to provide the required intensity of service to ensure the patient's safety. The patient's presenting symptoms, physical exam findings, and initial radiographic and laboratory data in the context of their chronic comorbidities is felt to place them at high risk for further clinical deterioration. Furthermore, it is not anticipated that the patient will be medically stable for discharge from the hospital within 2 midnights of admission.   * I certify that at the point of admission it is my clinical judgment that the patient will require inpatient hospital care spanning beyond 2 midnights from the point of admission due to high intensity of service, high risk for further deterioration and high frequency of surveillance required.*  Time spent:  65 minutes  Author:  Vernelle Emerald MD  11/09/2021 8:06 PM

## 2021-11-09 NOTE — Progress Notes (Signed)
CRITICAL VALUE STICKER  CRITICAL VALUE:  Potassium 2.5  RECEIVER (on-site recipient of call):  Clerance Lav RN  DATE & TIME NOTIFIED:  11/09/2021   1702  MESSENGER (representative from lab):  MD NOTIFIED:  Inda Merlin MD   TIME OF NOTIFICATION: 9791  RESPONSE:   New orders received

## 2021-11-09 NOTE — Progress Notes (Signed)
Patient ID: Justin [name], male   DOB: [birth year], 66 y.o.   MRN: [id number] Subjective: 5 Days Post-Op Procedure(s) (LRB): TOTAL HIP ARTHROPLASTY ANTERIOR APPROACH (Left)    Patient reports pain as mild. He recognizes his confusion It doesn't look like he has any narcotics ordered  Re-peat bedside cardiac echo this am.  Technician noted and commented to me (unofficially) findings of left pleural effusion   Objective:   VITALS:   Vitals:   11/08/21 2213 11/09/21 0459  BP: (!) 97/58 119/66  Pulse: 95 98  Resp: 18 18  Temp: 98.5 F (36.9 C) (!) 97.2 F (36.2 C)  SpO2: 95% 95%    Neurovascular intact Incision: dressing C/D/I - left hip  LABS Recent Labs    11/07/21 0329 11/08/21 0401 11/09/21 0347  HGB 8.7* 9.9* 9.5*  HCT 26.7* 30.8* 29.8*  WBC 56.3* 45.1* 50.0*  PLT 261 284 274    Recent Labs    11/07/21 0329 11/08/21 0401 11/09/21 0347  NA 134* 136 137  K 3.7 3.6 2.8*  BUN 25* 27* 25*  CREATININE 2.12* 2.04* 1.87*  GLUCOSE 97 87 94    Recent Labs    11/08/21 0806 11/09/21 0347  INR >10.0* 3.3*     Assessment/Plan: 5 Days Post-Op Procedure(s) (LRB): TOTAL HIP ARTHROPLASTY ANTERIOR APPROACH (Left)   Plan: We appreciate the help with Justin [name] and his acute delirium Hold all narcotics Questionable significance of the pleural effusion other than know heart failure and reduced EF (40%) Continue medical management to rule out and treat issues I feel that his acute delirium is multi factorial and will likely clear with time.  Peir operative medications, questionable hepatic etiology espicially given the findings of elevated INR    Mobility with PT as deemed safe

## 2021-11-10 ENCOUNTER — Inpatient Hospital Stay (HOSPITAL_COMMUNITY): Payer: Medicare Other

## 2021-11-10 DIAGNOSIS — T502X5A Adverse effect of carbonic-anhydrase inhibitors, benzothiadiazides and other diuretics, initial encounter: Secondary | ICD-10-CM | POA: Diagnosis present

## 2021-11-10 DIAGNOSIS — I5023 Acute on chronic systolic (congestive) heart failure: Secondary | ICD-10-CM | POA: Diagnosis not present

## 2021-11-10 DIAGNOSIS — D72823 Leukemoid reaction: Secondary | ICD-10-CM | POA: Diagnosis not present

## 2021-11-10 DIAGNOSIS — D649 Anemia, unspecified: Secondary | ICD-10-CM | POA: Diagnosis not present

## 2021-11-10 DIAGNOSIS — E876 Hypokalemia: Secondary | ICD-10-CM | POA: Diagnosis present

## 2021-11-10 DIAGNOSIS — Z96642 Presence of left artificial hip joint: Secondary | ICD-10-CM | POA: Diagnosis not present

## 2021-11-10 DIAGNOSIS — D473 Essential (hemorrhagic) thrombocythemia: Secondary | ICD-10-CM | POA: Diagnosis not present

## 2021-11-10 DIAGNOSIS — G08 Intracranial and intraspinal phlebitis and thrombophlebitis: Secondary | ICD-10-CM

## 2021-11-10 DIAGNOSIS — G9341 Metabolic encephalopathy: Secondary | ICD-10-CM | POA: Diagnosis not present

## 2021-11-10 LAB — COMPREHENSIVE METABOLIC PANEL
ALT: 13 U/L (ref 0–44)
AST: 46 U/L — ABNORMAL HIGH (ref 15–41)
Albumin: 2.6 g/dL — ABNORMAL LOW (ref 3.5–5.0)
Alkaline Phosphatase: 119 U/L (ref 38–126)
Anion gap: 12 (ref 5–15)
BUN: 20 mg/dL (ref 8–23)
CO2: 25 mmol/L (ref 22–32)
Calcium: 8.1 mg/dL — ABNORMAL LOW (ref 8.9–10.3)
Chloride: 103 mmol/L (ref 98–111)
Creatinine, Ser: 1.53 mg/dL — ABNORMAL HIGH (ref 0.61–1.24)
GFR, Estimated: 50 mL/min — ABNORMAL LOW (ref >60)
Glucose, Bld: 80 mg/dL (ref 70–99)
Potassium: 2.6 mmol/L — CL (ref 3.5–5.1)
Sodium: 140 mmol/L (ref 135–145)
Total Bilirubin: 2.3 mg/dL — ABNORMAL HIGH (ref 0.3–1.2)
Total Protein: 5.5 g/dL — ABNORMAL LOW (ref 6.5–8.1)

## 2021-11-10 LAB — BASIC METABOLIC PANEL
Anion gap: 11 (ref 5–15)
BUN: 16 mg/dL (ref 8–23)
CO2: 26 mmol/L (ref 22–32)
Calcium: 8.1 mg/dL — ABNORMAL LOW (ref 8.9–10.3)
Chloride: 103 mmol/L (ref 98–111)
Creatinine, Ser: 1.33 mg/dL — ABNORMAL HIGH (ref 0.61–1.24)
GFR, Estimated: 59 mL/min — ABNORMAL LOW (ref >60)
Glucose, Bld: 91 mg/dL (ref 70–99)
Potassium: 3 mmol/L — ABNORMAL LOW (ref 3.5–5.1)
Sodium: 140 mmol/L (ref 135–145)

## 2021-11-10 LAB — CBC WITH DIFFERENTIAL/PLATELET
Abs Immature Granulocytes: 0.7 10*3/uL — ABNORMAL HIGH (ref 0.00–0.07)
Band Neutrophils: 5 %
Basophils Absolute: 1.3 10*3/uL — ABNORMAL HIGH (ref 0.0–0.1)
Basophils Relative: 2 %
Eosinophils Absolute: 0 10*3/uL (ref 0.0–0.5)
Eosinophils Relative: 0 %
HCT: 32.2 % — ABNORMAL LOW (ref 39.0–52.0)
Hemoglobin: 10.3 g/dL — ABNORMAL LOW (ref 13.0–17.0)
Lymphocytes Relative: 0 %
Lymphs Abs: 0 10*3/uL — ABNORMAL LOW (ref 0.7–4.0)
MCH: 33.7 pg (ref 26.0–34.0)
MCHC: 32 g/dL (ref 30.0–36.0)
MCV: 105.2 fL — ABNORMAL HIGH (ref 80.0–100.0)
Monocytes Absolute: 9.4 10*3/uL — ABNORMAL HIGH (ref 0.1–1.0)
Monocytes Relative: 14 %
Myelocytes: 1 %
Neutro Abs: 55.9 10*3/uL — ABNORMAL HIGH (ref 1.7–7.7)
Neutrophils Relative %: 78 %
Platelets: 306 10*3/uL (ref 150–400)
RBC: 3.06 MIL/uL — ABNORMAL LOW (ref 4.22–5.81)
RDW: 24.1 % — ABNORMAL HIGH (ref 11.5–15.5)
WBC: 67.4 10*3/uL (ref 4.0–10.5)
nRBC: 4 /100 WBC — ABNORMAL HIGH
nRBC: 4.1 % — ABNORMAL HIGH (ref 0.0–0.2)

## 2021-11-10 LAB — PROTIME-INR
INR: 1.9 — ABNORMAL HIGH (ref 0.8–1.2)
Prothrombin Time: 21.9 seconds — ABNORMAL HIGH (ref 11.4–15.2)

## 2021-11-10 LAB — APTT
aPTT: 39 seconds — ABNORMAL HIGH (ref 24–36)
aPTT: 76 seconds — ABNORMAL HIGH (ref 24–36)

## 2021-11-10 LAB — PHOSPHORUS: Phosphorus: 2.6 mg/dL (ref 2.5–4.6)

## 2021-11-10 LAB — MRSA NEXT GEN BY PCR, NASAL: MRSA by PCR Next Gen: NOT DETECTED

## 2021-11-10 LAB — HEPARIN LEVEL (UNFRACTIONATED): Heparin Unfractionated: 0.61 IU/mL (ref 0.30–0.70)

## 2021-11-10 LAB — BRAIN NATRIURETIC PEPTIDE: B Natriuretic Peptide: 1137 pg/mL — ABNORMAL HIGH (ref 0.0–100.0)

## 2021-11-10 LAB — MAGNESIUM: Magnesium: 1.8 mg/dL (ref 1.7–2.4)

## 2021-11-10 MED ORDER — ALBUTEROL SULFATE (2.5 MG/3ML) 0.083% IN NEBU: 2.5000 mg | INHALATION_SOLUTION | RESPIRATORY_TRACT | Status: DC | PRN

## 2021-11-10 MED ORDER — POTASSIUM CHLORIDE 10 MEQ/100ML IV SOLN
10.0000 meq | INTRAVENOUS | Status: AC
  Administered 2021-11-10 – 2021-11-11 (×4): 10 meq via INTRAVENOUS
  Filled 2021-11-10 (×4): qty 100

## 2021-11-10 MED ORDER — POTASSIUM CHLORIDE 20 MEQ PO PACK
40.0000 meq | PACK | Freq: Once | ORAL | Status: AC
  Administered 2021-11-10: 40 meq via ORAL
  Filled 2021-11-10: qty 2

## 2021-11-10 MED ORDER — LIP MEDEX EX OINT: TOPICAL_OINTMENT | CUTANEOUS | Status: DC | PRN

## 2021-11-10 MED ORDER — HEPARIN (PORCINE) 25000 UT/250ML-% IV SOLN
1600.0000 [IU]/h | INTRAVENOUS | Status: DC
  Administered 2021-11-10 – 2021-11-11 (×2): 1350 [IU]/h via INTRAVENOUS
  Administered 2021-11-12: 1450 [IU]/h via INTRAVENOUS
  Filled 2021-11-10 (×3): qty 250

## 2021-11-10 MED ORDER — CHLORHEXIDINE GLUCONATE CLOTH 2 % EX PADS
6.0000 | MEDICATED_PAD | Freq: Every day | CUTANEOUS | Status: DC
  Administered 2021-11-10 – 2021-11-19 (×10): 6 via TOPICAL

## 2021-11-10 MED ORDER — POTASSIUM CHLORIDE CRYS ER 20 MEQ PO TBCR: 40.0000 meq | EXTENDED_RELEASE_TABLET | Freq: Once | ORAL | Status: DC

## 2021-11-10 MED ORDER — FUROSEMIDE 10 MG/ML IJ SOLN
40.0000 mg | Freq: Two times a day (BID) | INTRAMUSCULAR | Status: AC
  Administered 2021-11-10 (×2): 40 mg via INTRAVENOUS
  Filled 2021-11-10 (×2): qty 4

## 2021-11-10 MED ORDER — IOHEXOL 300 MG/ML  SOLN
75.0000 mL | Freq: Once | INTRAMUSCULAR | Status: AC | PRN
  Administered 2021-11-10: 75 mL via INTRAVENOUS

## 2021-11-10 MED ORDER — POTASSIUM CHLORIDE 10 MEQ/100ML IV SOLN
10.0000 meq | INTRAVENOUS | Status: AC
  Administered 2021-11-10 (×5): 10 meq via INTRAVENOUS
  Filled 2021-11-10 (×5): qty 100

## 2021-11-10 MED ORDER — MAGNESIUM SULFATE IN D5W 1-5 GM/100ML-% IV SOLN
1.0000 g | Freq: Once | INTRAVENOUS | Status: AC
  Administered 2021-11-10: 1 g via INTRAVENOUS
  Filled 2021-11-10: qty 100

## 2021-11-10 MED ORDER — POTASSIUM CHLORIDE CRYS ER 20 MEQ PO TBCR
40.0000 meq | EXTENDED_RELEASE_TABLET | ORAL | Status: AC
  Administered 2021-11-10 (×2): 40 meq via ORAL
  Filled 2021-11-10 (×2): qty 2

## 2021-11-10 MED ORDER — SODIUM CHLORIDE (PF) 0.9 % IJ SOLN
INTRAMUSCULAR | Status: AC
  Filled 2021-11-10: qty 50

## 2021-11-10 MED ORDER — ALBUTEROL SULFATE (2.5 MG/3ML) 0.083% IN NEBU
2.5000 mg | INHALATION_SOLUTION | Freq: Four times a day (QID) | RESPIRATORY_TRACT | Status: DC
  Administered 2021-11-10 – 2021-11-11 (×2): 2.5 mg via RESPIRATORY_TRACT
  Filled 2021-11-10 (×2): qty 3

## 2021-11-10 NOTE — Progress Notes (Addendum)
PT Cancellation Note  Patient Details Name: Justin [name] MRN: [id number] DOB: [birth year]   Cancelled Treatment:    Reason Eval/Treat Not Completed: Medical issues which prohibited therapy. Per Neurology (thru secure chat), (+) dural venous sinus thrombosis, will need to hold PT until further instruction from medical team/neurology.    [facility]  Office: [phone]

## 2021-11-10 NOTE — Consult Note (Signed)
[facility]    Date of Admission:  11/04/2021     Reason for Consult: acute on chronic leukocytosis    Referring Provider: Inda Merlin     Abx: 10/19-22 piptazo  10/17 periop cefazolin        Assessment: 66 yo male with myeloproliferative disease on hydroxyurea, cad s/p cabg, s/p bioprosthetic avr, afib on eliquis prior to admission, hx colonic avm's (recent prior to admission gib s/p colonoscopy with AVC), PAD, left hip djd symptomatic admitted 10/17 for planned left hipo arthroplasty found to have elevated INR (gi following). Hospital course complicated by post-op ams and acute on chronic leukocytosis  History and exam findings don't suggest any symptomatology of infectious syndrome Cxr and labs suggest pulm edema which he is getting iv furosemide for. I do not appreciate sign of infectious pneumonia otherwise  He has aki and hypokalemia in setting diuresis which are appropriately managed and his aki is improving His smear is showing polychromasia with predominant neutrophils and mild left shift only  He has brain mri on 10/21 which might suggest sinus venous thrombosis and neurology is following. While I don't know his baseline mentation his conversation/expressive speech are rather lacking but he does follows command on exam. He has no fever or other exam findings to suggest an  infectious meningitides or encephalitides process at this time  He also has several days bsAbx without affecting his leukocytosis response  10/19 bcx and ucx negative (no uti sx either)  Lastly, gi following for cirrhosis evaluation but do not suspect cirrhosis. And 5 days of bsAbx that would cover sbp if that was the case didn't alter any lab abnormality or mentation     Plan: No further ID investigation needed  I would avoid empiric abx unless clear sign of sepsis/hemodynamic decompensation I suspect ams is related to surgery; question of sinus venous  thrombosis being evaluated by neurology. He is also on several psychotropic meds that could contribute Will sign off Discussed with primary team   I spent 75 minute reviewing data/chart, and coordinating care and >50% direct face to face time providing counseling/discussing diagnostics/treatment plan with patient      ------------------------------------------------ Principal Problem:   S/P total left hip arthroplasty Active Problems:   Leukocytosis   Chronic atrial fibrillation with RVR ([place])   Essential thrombocythemia ([place])   Coronary artery disease involving native heart without angina pectoris   Acute anemia   AKI (acute kidney injury) ([place])   Mixed diabetic hyperlipidemia associated with type 2 diabetes mellitus ([place])   Hypomagnesemia   Acute metabolic encephalopathy   Acute on chronic systolic CHF (congestive heart failure) (H[facility])   Malnutrition of moderate degree   Elevated INR   BPH with obstruction/lower urinary tract symptoms    HPI: Justin [name] is a 66 y.o. male with myeloproliferative disease on hydroxyurea, cad s/p cabg, s/p bioprosthetic avr, afib on eliquis prior to admission, hx colonic avm's (recent prior to admission gib s/p colonoscopy with AVC), PAD, left hip djd symptomatic admitted 10/17 for planned left hipo arthroplasty found to have elevated INR (gi following). Hospital course complicated by post-op ams and acute on chronic leukocytosis  Hx via sign out, chart review, and interaction with patient Patient appears to have mentation not at baseline vs expressive aphasia and not able to provide detail history  He was admitted 10/17 for left hip arthroplasty. Chart mentioned blood loss periop needing 2 units prbc and transexamic  acid. There was no sign of sepsis on admission  His inr was >10 on presentation. Gi following and suspect multifactorial including eliquis contribution which was hold. They are not suspecting cirrhosis. His inr had improved  with vit k supplementation and holding eliquis. He has no current active bleeding/blood loss  Lactulose was started this admission however   He had developed fluid overload during admission post op and getting iv diuresis which had contributed to aki and hypokalemia (improving. Cxr did along with elevated bnp suggest pulm edema. He is on minimal o2 supplement 2 liters as of today 10/23  Oncology following and suspect reactive leukocytosis. He was on hydrea prior to admission for myeloproliferative disease  Neurology also involved for ams and suspect sinus venous thrombosis based on mri finding. Further w/u in process   He has no/minimal pain on his left hip incision. There is no sign of cellulitis there  He denies cough, chest pain, current dyspnea, rash, diarrhea, dysuria, focal pain, n/v, headache  He has no obvious noted unilateral weakness per chart review/nursing staff     Family History  Adopted: Yes  Problem Relation Age of Onset   Bladder Cancer Father        adopted father    Social History   Tobacco Use   Smoking status: Former    Packs/day: 0.25    Years: 23.00    Total pack years: 5.75    Types: Cigars, Cigarettes    Quit date: 01/02/2018    Years since quitting: 3.8   Smokeless tobacco: Never   Tobacco comments:    quit cigs in 2011 and smokes cigars daily- from 3-10 cigars-09/27/13  Vaping Use   Vaping Use: Never used  Substance Use Topics   Alcohol use: Not Currently    Alcohol/week: 2.0 standard drinks of alcohol    Types: 2 Glasses of wine per week    Comment: occasional   Drug use: No    Comment: occasionally    Allergies  Allergen Reactions   Codeine Swelling and Other (See Comments)    Tolerates hydrocodone, tramadol, and oxycodone    Dilaudid [Hydromorphone] Anxiety    Hallucinations    Doxycycline Itching and Rash    Review of Systems: ROS All Other ROS was negative, except mentioned above   Past Medical History:  Diagnosis Date    Acute meniscal tear of knee LEFT   Anemia    Aortic stenosis 12/01/2017   NONRHEUMATIC, AORTIC VALVE CALCIFICATIONS, MILD TO MODERATE REGURG, MILD TO MODERATE CALCIFIED ANNULUS per ECHO 10/25/17 @ [facility] [address]   Arthritis    Atrial fibrillation ([place]) 11/12/2017   AT O/V WITH PCP   Cancer ([place])    skin right arm   Coronary artery disease    Dyspnea    GERD (gastroesophageal reflux disease)    Headache    hx of migraines   Heart murmur MILD-- ASYMPTOMATIC   History of kidney stones    Hyperlipidemia    Hypertension    Left knee pain    Mixed diabetic hyperlipidemia associated with type 2 diabetes mellitus ([place]) 11/05/2021   PAD (peripheral artery disease) ([facility])    left leg claudication   Sleep apnea        Scheduled Meds:  bethanechol  10 mg Oral TID   docusate sodium  100 mg Oral BID   feeding supplement  237 mL Oral BID BM   furosemide  40 mg Intravenous BID   lactulose  20 g Oral  BID   loratadine  10 mg Oral Daily   metoprolol tartrate  25 mg Oral BID   multivitamin with minerals  1 tablet Oral Daily   pantoprazole  40 mg Oral BID   polyethylene glycol  17 g Oral Daily   QUEtiapine  25 mg Oral Daily   rosuvastatin  20 mg Oral Daily   senna  2 tablet Oral QHS   sodium chloride (PF)       tamsulosin  0.4 mg Oral Daily   thiamine (VITAMIN B1) injection  100 mg Intravenous Daily   Or   thiamine  100 mg Oral Daily   Continuous Infusions:  sodium chloride Stopped (11/10/21 1509)   heparin 1,350 Units/hr (11/10/21 1515)   methocarbamol (ROBAXIN) IV     PRN Meds:.sodium chloride, acetaminophen, albuterol, bisacodyl, menthol-cetylpyridinium **OR** phenol, methocarbamol **OR** methocarbamol (ROBAXIN) IV, metoCLOPramide **OR** metoCLOPramide (REGLAN) injection, nitroGLYCERIN, ondansetron **OR** ondansetron (ZOFRAN) IV, sodium chloride (PF)   OBJECTIVE: Blood pressure 113/71, pulse 100, temperature 98.7 F (37.1 C), temperature source Oral, resp. rate 18,  height '5\' 6"'$  (1.676 m), weight 75.8 kg, SpO2 95 %.  Physical Exam  General/constitutional: no distress, conversant but do not make conversational sense or articulation. Follows command though HEENT: Normocephalic, PER, Conj Clear, EOMI, Oropharynx clear Neck supple CV: rrr no mrg Lungs: clear to auscultation, normal respiratory effort Abd: Soft, Nontender Ext: no edema Skin: No Rash Neuro: nonfocal outside of articulation/expression of speech; strength seems symmetric MSK: no peripheral joint swelling/tenderness/warmth; back spines nontender; left hip incision dressing clean/dry; slight edema but no erythema   Lab Results Lab Results  Component Value Date   WBC 67.4 (HH) 11/10/2021   HGB 10.3 (L) 11/10/2021   HCT 32.2 (L) 11/10/2021   MCV 105.2 (H) 11/10/2021   PLT 306 11/10/2021    Lab Results  Component Value Date   CREATININE 1.53 (H) 11/10/2021   BUN 20 11/10/2021   NA 140 11/10/2021   K 2.6 (LL) 11/10/2021   CL 103 11/10/2021   CO2 25 11/10/2021    Lab Results  Component Value Date   ALT 13 11/10/2021   AST 46 (H) 11/10/2021   ALKPHOS 119 11/10/2021   BILITOT 2.3 (H) 11/10/2021      Microbiology: Recent Results (from the past 240 hour(s))  Culture, blood (Routine X 2) w Reflex to ID Panel     Status: None (Preliminary result)   Collection Time: 11/06/21 11:36 AM   Specimen: BLOOD LEFT ARM  Result Value Ref Range Status   Specimen Description   Final    BLOOD LEFT ARM Performed at [facility] [address], [place], [place] [address]    Special Requests   Final    BOTTLES DRAWN AEROBIC AND ANAEROBIC Blood Culture adequate volume Performed at [facility] [address] [place], [place] [address]    Culture   Final    NO GROWTH 4 DAYS Performed at [facility], [place] [address], [place], [place] [address]    Report Status PENDING  Incomplete  Culture, blood (Routine X 2) w Reflex to ID Panel     Status:  None (Preliminary result)   Collection Time: 11/06/21 11:47 AM   Specimen: BLOOD RIGHT HAND  Result Value Ref Range Status   Specimen Description   Final    BLOOD RIGHT HAND Performed at [facility] [address], [place], [place] [address]    Special Requests   Final  BOTTLES DRAWN AEROBIC AND ANAEROBIC Blood Culture adequate volume Performed at [facility] [address], [place], [place] [address]    Culture   Final    NO GROWTH 4 DAYS Performed at [facility], [place] [address] [place], [place] [address]    Report Status PENDING  Incomplete  Urine Culture     Status: None   Collection Time: 11/06/21  3:11 PM   Specimen: In/Out Cath Urine  Result Value Ref Range Status   Specimen Description   Final    IN/OUT CATH URINE Performed at [facility] [address] [place], [place] [address]    Special Requests   Final    Immunocompromised Performed at [facility], [place] [address], [place], [place] [address]    Culture   Final    NO GROWTH Performed at [facility], [place] [address], [place], [place] [address]    Report Status 11/07/2021 FINAL  Final     Serology:    Imaging: If present, new imagings (plain films, ct scans, and mri) have been personally visualized and interpreted; radiology reports have been reviewed. Decision making incorporated into the Impression / Recommendations.   10/21 mri brain 1. No acute intracranial abnormality. 2. Asymmetrically decreased signal within the left sigmoid sinus with an absent flow void on T2 weighted imaging. This may represent slow flow or occlusion. If there is concern for dural venous sinus thrombosis, further evaluation with a CT venogram is recommended.    10/23 cxr FINDINGS: Stable cardiomegaly. Status post coronary bypass graft. Stable bilateral lung opacities are noted consistent with pulmonary edema. Small left pleural  effusion is noted. Bony thorax is unremarkable.   IMPRESSION: Continued bilateral pulmonary edema.  Small left pleural effusion.  Jabier Mutton, [facility] [place] [phone] pager    11/10/2021, 3:19 PM

## 2021-11-10 NOTE — Assessment & Plan Note (Addendum)
   CT venogram 10/23 confirmed the presence of a dural venous sinus thrombosis  Atypical presentation as patient's primary symptoms have been encephalopathy and urinary retention, not headache  Since initiation of heparin on 10/23, patient's encephalopathy has improved since only, but is a ways off from complete resolution  Monitoring patient in the stepdown unit for any evidence of seizure activity or bleeding complications  EEG with no seizures, moderate encephalopathy, no focal slowing noted.  Patient was seen by neurology who was following who are recommending continue treatment for CVST with heparin with goal between 0.3-0.5 due to coagulopathy, heparin on full dose subcutaneous Lovenox preferred agent for treatment of CVST in the acute phase but after 5 days of treatment with heparin could be transition back to Eliquis 5 mg twice daily.  Per Neurology recommendations.    Neurology was following but have signed off.   Continuing serial neurologic checks  Dr. Marin Olp has initiated a hypercoagulable work-up and has additionally initiated hydroxyurea  Neurology, hematology/oncology following and appreciate input and recommendations.

## 2021-11-10 NOTE — Plan of Care (Addendum)
Brief Neurology Update  MRI brain c/f possible venous sinus thrombosis. I have ordered CTV head to clarify this. We will touch base with primary team after study has resulted.   Su Monks, MD [facility] [phone]  If 7pm- 7am, please page neurology on call as listed in [place].

## 2021-11-10 NOTE — Progress Notes (Signed)
Overall, I think Justin [name] is feeling better.  He looks better.  He is more alert.  He knew who I was.  His INR pretty much is normalized now.  A self hard time believing that his INR being elevated is caused by the Eliquis.  He has been on Eliquis for a long time for his atrial fibrillation.  This morning, his INR was 1.9.  His white cell count is 67.4K, the hemoglobin is 10.3.  Platelet count 306,000.  His chemistry studies shows potassium of 2.6.  BUN 20 creatinine 1.53.  His albumin is 2.6.  Bilirubin is 2.3.  He has been BNP  is 1137.  I know that he is being followed closely by cardiology, gastroenterology, and neurology.  I am still not sure how much he is eating.  He has had no nausea or vomiting.  I do not know how much he is having with diarrhea or constipation.  He does not appear to be in a lot of pain from the hip surgery.  I know that physical therapy will be working with him.  He did have an MRI of the brain on 11/08/2021.  This did not show any obvious cerebrovascular issues that could have caused the confusion.  He does have cirrhosis.  However, his ammonia was not all that bad.  Regardless, he is on some lactulose.  Again the white cell count elevation is probably of his myeloproliferative disorder.  It is also partly reactive.  I would just watch it for right now.  I would not put him on any Hydrea.  Again, this has been a very difficult recovery from his hip surgery.  I just would never thought that he would have had this amount of difficulty.  Hopefully, he is beginning to come around now.  Lattie Haw, MD  [facility]  6:9

## 2021-11-10 NOTE — Progress Notes (Signed)
Date and time results received: 11/10/21 0436   Test: WBC Critical Value: 67.4  Name of Provider Notified: Gershon Cull, NP  Date and time notified: 11/10/21 0440  Orders Received? No orders received

## 2021-11-10 NOTE — Progress Notes (Signed)
ANTICOAGULATION CONSULT NOTE - Initial Consult  Pharmacy Consult for Heparin Indication:  dural venous sinus thrombosis  Allergies  Allergen Reactions   Codeine Swelling and Other (See Comments)    Tolerates hydrocodone, tramadol, and oxycodone    Dilaudid [Hydromorphone] Anxiety    Hallucinations    Doxycycline Itching and Rash    Patient Measurements: Height: '5\' 6"'$  (167.6 cm) Weight: 75.8 kg (167 lb) IBW/kg (Calculated) : 63.8 Heparin Dosing Weight: 75.8 kg  Vital Signs: Temp: 98.7 F (37.1 C) (10/23 1247) Temp Source: Oral (10/23 1247) BP: 113/71 (10/23 1247) Pulse Rate: 100 (10/23 1247)  Labs: Recent Labs    11/08/21 0401 11/08/21 0806 11/08/21 1012 11/08/21 1137 11/09/21 0347 11/09/21 1555 11/10/21 0346  HGB 9.9*  --   --   --  9.5*  --  10.3*  HCT 30.8*  --   --   --  29.8*  --  32.2*  PLT 284  --   --   --  274  --  306  APTT 56* 57*  --   --  45*  --  39*  LABPROT >90.0* >90.0*  --   --  33.2*  --  21.9*  INR >10.0* >10.0*  --   --  3.3*  --  1.9*  CREATININE 2.04*  --   --   --  1.87* 1.76* 1.53*  TROPONINIHS  --   --  77* 70*  --   --   --     Estimated Creatinine Clearance: 42.3 mL/min (A) (by C-G formula based on SCr of 1.53 mg/dL (H)).   Medical History: Past Medical History:  Diagnosis Date   Acute meniscal tear of knee LEFT   Anemia    Aortic stenosis 12/01/2017   NONRHEUMATIC, AORTIC VALVE CALCIFICATIONS, MILD TO MODERATE REGURG, MILD TO MODERATE CALCIFIED ANNULUS per ECHO 10/25/17 @ [facility] [address]   Arthritis    Atrial fibrillation ([place]) 11/12/2017   AT O/V WITH PCP   Cancer ([place])    skin right arm   Coronary artery disease    Dyspnea    GERD (gastroesophageal reflux disease)    Headache    hx of migraines   Heart murmur MILD-- ASYMPTOMATIC   History of kidney stones    Hyperlipidemia    Hypertension    Left knee pain    Mixed diabetic hyperlipidemia associated with type 2 diabetes mellitus ([place]) 11/05/2021   PAD  (peripheral artery disease) ([place])    left leg claudication   Sleep apnea    Assessment: Left THA on 10/17  AC/Heme: Apixaban (PTA afib), last dose taken on 10/20.  - 10/17: left THA  -Apixaban resumed 10/19 post-op -Significant post-op blood loss  - 10/17: 2 units PRBCs - 10/18: 2 units PRBCs, Epogen 40000 units SQ x 1 - 10/20: repeat Epogen 40000 units SQ x 1 per [place] - 10/21: apixaban held. Vit K '5mg'$  PO x1; INR > 10 but INR elevated is likely 2/2 apixaban - 10/22: Vit K '5mg'$  PO x 1, INR 3.3, INR elevated d/t apixaban - 10/23: INR 1.9 (Eliquis LD 10/20), MRiIwith New dural venous sinus thrombosis to start IV heparin. Hgb 10.3 ok, Plts 306 ok. Scr 1.53 down.  Goal of Therapy:  Heparin level 0.3-0.7 units/ml aPTT 66-102 Monitor platelets by anticoagulation protocol: Yes   Plan:  IV heparin infusion at 1350 units/hr  No bolus with elevated INR Will check heparin level and aPTT in 6-8 hr Daily HL, aPTT, and CBC  Idil Maslanka S. [place] Highland, PharmD, BCPS Clinical Staff Pharmacist [email or web address] Alford Highland, [employer] 11/10/2021,1:36 PM

## 2021-11-10 NOTE — Evaluation (Signed)
Speech Language Pathology Evaluation Patient Details Name: Justin [name] MRN: [id number] DOB: 1955/10/23 Today's Date: 11/10/2021 Time: 1355-1410 SLP Time Calculation (min) (ACUTE ONLY): 15 min  Problem List:  Patient Active Problem List   Diagnosis Date Noted   BPH with obstruction/lower urinary tract symptoms 11/09/2021   Elevated INR    Acute on chronic systolic CHF (congestive heart failure) ([place]) 11/07/2021   Malnutrition of moderate degree 83/38/2505   Acute metabolic encephalopathy 39/76/7341   Mixed diabetic hyperlipidemia associated with type 2 diabetes mellitus ([place]) 11/05/2021   Hypomagnesemia 11/05/2021   S/P total left hip arthroplasty 11/04/2021   AKI (acute kidney injury) ([place]) 10/04/2019   Closed displaced trimalleolar fracture of right ankle 10/04/2019   Peritoneal hemorrhage 10/04/2019   Acute urinary retention 10/04/2019   Fall 10/04/2019   Status post right hip replacement 12/23/2018   PAD (peripheral artery disease) ([place]) 09/20/2018   Melena    Acute gastric ulcer with hemorrhage    Symptomatic anemia 08/10/2018   Acute anemia 08/10/2018   GI bleed 08/10/2018   Fatty liver    Normocytic anemia 07/19/2018   History of CEA (carotid endarterectomy) 02/09/2018   History of tobacco use 02/02/2018   Carotid stenosis 01/25/2018   S/P CABG x 4 01/03/2018   S/P aortic valve replacement with bioprosthetic valve 01/03/2018   Coronary artery disease involving native heart without angina pectoris 01/03/2018   Essential thrombocythemia ([place]) 12/21/2017   Bicuspid aortic valve determined by imaging 12/09/2017   Claudication ([place]) 12/09/2017   Sleep apnea 12/09/2017   Left ventricular systolic dysfunction without heart failure 12/09/2017   Erythrocytosis 11/19/2017   Leukocytosis 11/19/2017   Chronic atrial fibrillation with RVR ([place]) 11/12/2017   Essential hypertension 10/19/2013   Pulmonary infiltrates 09/27/2013   Cough 09/27/2013   Past Medical History:   Past Medical History:  Diagnosis Date   Acute meniscal tear of knee LEFT   Anemia    Aortic stenosis 12/01/2017   NONRHEUMATIC, AORTIC VALVE CALCIFICATIONS, MILD TO MODERATE REGURG, MILD TO MODERATE CALCIFIED ANNULUS per ECHO 10/25/17 @ [facility] [address]   Arthritis    Atrial fibrillation ([place]) 11/12/2017   AT O/V WITH PCP   Cancer ([place])    skin right arm   Coronary artery disease    Dyspnea    GERD (gastroesophageal reflux disease)    Headache    hx of migraines   Heart murmur MILD-- ASYMPTOMATIC   History of kidney stones    Hyperlipidemia    Hypertension    Left knee pain    Mixed diabetic hyperlipidemia associated with type 2 diabetes mellitus ([place]) 11/05/2021   PAD (peripheral artery disease) ([place])    left leg claudication   Sleep apnea    Past Surgical History:  Past Surgical History:  Procedure Laterality Date   AORTIC VALVE REPLACEMENT N/A 01/03/2018   Procedure: AORTIC VALVE REPLACEMENT (AVR) using 28m Magna Ease Bioprosthesis Aortic Valve;  Surgeon: VIvin Poot MD;  [facility]  Service: Open Heart Surgery;  Laterality: N/A;   APPENDECTOMY  1998   BIOPSY  08/11/2018   Procedure: BIOPSY;  Surgeon: NMauri Pole MD;  [facility]  Service: Endoscopy;;   BIOPSY  08/20/2021   Procedure: BIOPSY;  Surgeon: MIrving Copas, MD;  [facility]  Service: Gastroenterology;;   CATARACT EXTRACTION W/ INTRAOCULAR LENS  IMPLANT, BILATERAL  1998/  2Cash  C4 - 5   COLONOSCOPY N/A 08/21/2021  Procedure: COLONOSCOPY;  Surgeon: [name], MD;  Location: [place];  Service: Gastroenterology;  Laterality: N/A;   CORONARY ARTERY BYPASS GRAFT N/A 01/03/2018   Procedure: CORONARY ARTERY BYPASS GRAFTING (CABG) x 4 (LIMA to LAD, SVG to DIAGONAL, SVG to RAMUS INTERMEDIATE, and SVG to PDA), USING LEFT INFTERNAL MAMMARY ARTERY AND;  Surgeon: Prescott Gum, Collier Salina, MD;  Location: [place];  Service: Open Heart Surgery;   Laterality: N/A;   ENDARTERECTOMY Right 01/03/2018   Procedure: ENDARTERECTOMY CAROTID;  Surgeon: Marty Heck, MD;  Location[facility];  Service: Vascular;  Laterality: Right;   ESOPHAGOGASTRODUODENOSCOPY (EGD) WITH PROPOFOL N/A 08/11/2018   Procedure: ESOPHAGOGASTRODUODENOSCOPY (EGD) WITH PROPOFOL;  Surgeon: Mauri Pole, MD;  Location: [place];  Service: Endoscopy;  Laterality: N/A;   ESOPHAGOGASTRODUODENOSCOPY (EGD) WITH PROPOFOL N/A 08/20/2021   Procedure: ESOPHAGOGASTRODUODENOSCOPY (EGD) WITH PROPOFOL;  Surgeon: Rush Landmark Telford Nab., MD;  Location: [place];  Service: Gastroenterology;  Laterality: N/A;   HOT HEMOSTASIS N/A 08/20/2021   Procedure: HOT HEMOSTASIS (ARGON PLASMA COAGULATION/BICAP);  Surgeon: Irving Copas., MD;  Location: [place];  Service: Gastroenterology;  Laterality: N/A;   HOT HEMOSTASIS N/A 08/21/2021   Procedure: HOT HEMOSTASIS (ARGON PLASMA COAGULATION/BICAP);  Surgeon: [name], MD;  Location: [place];  Service: Gastroenterology;  Laterality: N/A;   KNEE ARTHROSCOPY W/ MENISCECTOMY  1991  1987   LEFT KNEE   x 2   NASAL SINUS SURGERY  1982   ORIF ANKLE FRACTURE Right 10/07/2019   Procedure: OPEN REDUCTION INTERNAL FIXATION (ORIF) ANKLE FRACTURE;  Surgeon: Hiram Gash, MD;  Location: [place];  Service: Orthopedics;  Laterality: Right;  Regional RIGHT  ankle block as well.   RIGHT/LEFT HEART CATH AND CORONARY ANGIOGRAPHY N/A 12/24/2017   Procedure: RIGHT/LEFT HEART CATH AND CORONARY ANGIOGRAPHY;  Surgeon: Jolaine Artist, MD;  Location: [place] [facility];  Service: Cardiovascular;  Laterality: N/A;   ROTATOR CUFF REPAIR  09-04-2005   LEFT SHOULDER   TEE WITHOUT CARDIOVERSION N/A 12/24/2017   Procedure: TRANSESOPHAGEAL ECHOCARDIOGRAM (TEE);  Surgeon: Jolaine Artist, MD;  Location: [facility]COPY;  Service: Cardiovascular;  Laterality: N/A;   TEE WITHOUT CARDIOVERSION N/A 01/03/2018   Procedure: TRANSESOPHAGEAL  ECHOCARDIOGRAM (TEE);  Surgeon: Prescott Gum, Collier Salina, MD;  Location: [place];  Service: Open Heart Surgery;  Laterality: N/A;   TONSILLECTOMY  AGE 67   TOTAL HIP ARTHROPLASTY Right 12/23/2018   Procedure: TOTAL HIP ARTHROPLASTY ANTERIOR APPROACH;  Surgeon: Paralee Cancel, MD;  Location: [facility];  Service: Orthopedics;  Laterality: Right;  70 mins   TOTAL HIP ARTHROPLASTY Left 11/04/2021   Procedure: TOTAL HIP ARTHROPLASTY ANTERIOR APPROACH;  Surgeon: Paralee Cancel, MD;  Location: [facility];  Service: Orthopedics;  Laterality: Left;   HPI:  Patient is a 66 y.o. male with PMH: a-fib, GERD, CAD, heart murmur, arthritis, anemia, HLD, HTN, PAD, sleep apnea. He presented to the hospital on 11/04/21 for surgical treatment of left hip osteoarthritis (total hip arthroplasty anterior approach). He had significant blood loss during surgery and required two units transfused perioperatively on 10/17. Hospitalization complicated by substantial anemia on 10/18 requring two more unites of packed red blood cells transfused and on 10/19, patient with worsening leukocytosis and development of encephalopathy concerning for underlying infectious process. MRI brain negative for acute intracranial abnormality but was concerning for dural venous sinus thrombosis; CT venogram head completed on 10/23 and showed "nonopacification of the left sigmoid sinus and visualized left internal jugular vein, suspicious for dural venous sinus thrombosis".  Assessment / Plan / Recommendation Clinical Impression  Patient currently presenting with moderately impaired cognition as per this evaluation. He was only oriented to self, stating month as January, year as 2022, place as "a mortuary". When asked why he was hear, he just said "my left ankle". He perseverated on things he said as well as what SLP had said and required significant cues to redirect back to topic. He exhibited no awareness to any of his cognitive or language errors, such as correctly  reading nurses name on board in front of him, but when asked another name printed below, he replied "stainless steel" (he responded 'stainless steel' to several other questions). He named only five animals when given category and one minute time limit, was accurate on less than 50% for questions related to object function, such as when asked what you would pour juice or water into, replying "your butt". At one point he casually called over to his wife who was not in the room (no family members in room during this evaluation). At this time, patient exhibiting significant cognitive-linguistic impairments in areas of: attention, recall, awareness, pragmatics, expressive language, problem solving. SLP will continue to follow patient to focus on cognitive-linguistic treatment and ongoing assessment.    SLP Assessment  SLP Recommendation/Assessment: Patient needs continued Speech [facility] SLP Visit Diagnosis: Cognitive communication deficit (R41.841)    Recommendations for follow up therapy are one component of a multi-disciplinary discharge planning process, led by the attending physician.  Recommendations may be updated based on patient status, additional functional criteria and insurance authorization.    Follow Up Recommendations  Follow physician's recommendations for discharge plan and follow up therapies    Assistance Recommended at Discharge  Frequent or constant Supervision/Assistance  Functional Status Assessment Patient has had a recent decline in their functional status and demonstrates the ability to make significant improvements in function in a reasonable and predictable amount of time.  Frequency and Duration min 2x/week  1 week      SLP Evaluation Cognition  Overall Cognitive Status: Impaired/Different from baseline Arousal/Alertness: Awake/alert Orientation Level: Oriented to person;Disoriented to time;Disoriented to situation;Disoriented to place Year:  2022 Month: January Day of Week: Incorrect Attention: Sustained Sustained Attention: Impaired Sustained Attention Impairment: Verbal basic;Functional basic Memory: Impaired Memory Impairment: Storage deficit;Decreased recall of new information Awareness: Impaired Awareness Impairment: Intellectual impairment Problem Solving: Impaired Problem Solving Impairment: Verbal basic;Functional basic Behaviors: Perseveration Safety/Judgment: Impaired       Comprehension  Auditory Comprehension Overall Auditory Comprehension: Appears within functional limits for tasks assessed    Expression Expression Primary Mode of Expression: Verbal Verbal Expression Overall Verbal Expression: Impaired Initiation: No impairment Naming: Impairment Responsive: 51-75% accurate Confrontation: Within functional limits Convergent: 50-74% accurate Divergent: 50-74% accurate Verbal Errors: Perseveration;Not aware of errors;Language of confusion Pragmatics: Impairment Impairments: Abnormal affect;Topic maintenance Interfering Components: Attention Effective Techniques: Open ended questions;Semantic cues;Sentence completion Non-Verbal Means of Communication: Not applicable   Oral / Motor  Oral Motor/Sensory Function Overall Oral Motor/Sensory Function: Within functional limits Motor Speech Overall Motor Speech: Appears within functional limits for tasks assessed            Sonia Baller, MA, CCC-SLP Speech Therapy

## 2021-11-10 NOTE — Progress Notes (Signed)
Date and time results received: 11/10/21 0525   Test: potassium Critical Value: 2.6  Name of Provider Notified: Gershon Cull, NP Date and time notified: 11/10/21 0530  Orders Received? New orders received.

## 2021-11-10 NOTE — Assessment & Plan Note (Addendum)
   Improved but trending back down likely related to diuresis.  Potassium at 4.1  Repeat labs in the AM.

## 2021-11-10 NOTE — Progress Notes (Signed)
Patient ID: Justin [name], male   DOB: Jul 03, 1955, 66 y.o.   MRN: [id number] Subjective: 6 Days Post-Op Procedure(s) (LRB): TOTAL HIP ARTHROPLASTY ANTERIOR APPROACH (Left)    Patient reports pain as mild. No acute events  Objective:   VITALS:   Vitals:   11/09/21 2121 11/10/21 0548  BP: 120/66 127/71  Pulse: (!) 108 (!) 105  Resp: 18 18  Temp: 97.7 F (36.5 C) 97.6 F (36.4 C)  SpO2: 94% 95%    Neurovascular intact Incision: dressing C/D/I  LABS Recent Labs    11/08/21 0401 11/09/21 0347 11/10/21 0346  HGB 9.9* 9.5* 10.3*  HCT 30.8* 29.8* 32.2*  WBC 45.1* 50.0* 67.4*  PLT 284 274 306    Recent Labs    11/09/21 0347 11/09/21 1555 11/10/21 0346  NA 137 139 140  K 2.8* 2.5* 2.6*  BUN 25* 23 20  CREATININE 1.87* 1.76* 1.53*  GLUCOSE 94 90 80    Recent Labs    11/09/21 0347 11/10/21 0346  INR 3.3* 1.9*     Assessment/Plan: 6 Days Post-Op Procedure(s) (LRB): TOTAL HIP ARTHROPLASTY ANTERIOR APPROACH (Left)   Up with therapy if able to participate  It looks as if there are orders for his noted hypokalemia.  We leave this to the Hospitalist to assist to get back to normal to stabilize his heart  Continue to monitor for clearing of delirium   Hgb stable at this point off TXA

## 2021-11-10 NOTE — Plan of Care (Signed)
  Problem: Pain Management: Goal: Pain level will decrease with appropriate interventions Outcome: Progressing   Problem: Safety: Goal: Ability to remain free from injury will improve Outcome: Progressing   Problem: Skin Integrity: Goal: Risk for impaired skin integrity will decrease Outcome: Progressing

## 2021-11-10 NOTE — Progress Notes (Addendum)
Subjective: Patient is awake and alert and oriented to self, month, year, city and situation. Unable to state type of place. On heparin drip.   Exam: Vitals:   11/10/21 1247 11/10/21 1537  BP: 113/71   Pulse: 100   Resp: 18   Temp: 98.7 F (37.1 C) 98.1 F (36.7 C)  SpO2: 95%    Gen: In bed, NAD Resp: non-labored breathing, no acute distress Abd: soft, nt  Neuro: MS: Awake, oriented to self, month, year, city and situation. Unable to state type of place. Follows commands  CN: EOMI, no nystagmus Motor: Moves all extremities well, he does have mild asterixis today. Sensory: Intact to light touch  Pertinent Labs:  Sodium 137 Calcium 7.6, albumin 2.4 Creatinine 1.87 with GFR 39 ESR 0 RPR negative TSH  4.9 Folate nml B12 3470 Abg with mild hypoxemia. O2 sats 93 today  MRI 10/21  Asymmetrically decreased signal within the left sigmoid sinus with an absent flow void on T2 weighted imaging. This may represent slow flow or occlusion. If there is concern for dural venous sinus thrombosis, further evaluation with a CT venogram is recommende  CTV  10/23 Similar nonopacification of the left sigmoid sinus and visualized left internal jugular vein, suspicious for dural venous sinus thrombosis.   Impression: 66 year old male with multifactorial delirium.  Mentation improved. However, found to have left sigmoid and L jugular occlusive dural sinus venous thrombosis. He is coagulopathic but despite that, agree with heparin gtt.  Recommendations: - continue Heparin drip, consider keeping Heparin levels lower between 0.3-0.5 instead of the usual range of 0.3-0.7 given his elevated risk of bleeding in the setting of coagulopathy. - continue thiamine daily  - continue Lactulose  - continue seroquel  - Delirium precautions.  Beulah Gandy DNP, ACNPC-AG  NEUROHOSPITALIST ADDENDUM Performed a face to face diagnostic evaluation.   I have reviewed the contents of history and physical  exam as documented by PA/ARNP/Resident and agree with above documentation.  I have discussed and formulated the above plan as documented. Edits to the note have been made as needed.   Donnetta Simpers, MD [facility] [phone]   If 7pm to 7am, please call on call as listed on AMION.

## 2021-11-10 NOTE — Progress Notes (Signed)
PROGRESS NOTE   [name]  PRF:[id number] DOB: Jun 05, 1955 DOA: 11/04/2021 PCP: Justin Dials, MD   Date of Service: the patient was seen and examined on 11/10/2021  Brief Narrative:  66 y.o. male with history of essential thrombocythemia, paroxysmal atrial fibrillation on Eliquis, coronary artery disease status post CABG history of functional disability to the left hip secondary to end-stage arthritis, failed outpatient nonsurgical conservative treatment greater than 12 weeks ago was admitted to the orthopedic service for left total hip arthroplasty.    Patient noted to have had significant blood loss in the perioperative period and received 2 units packed transfusions perioperatively on 10/17.  Patient initiated on tranexamic acid by orthopedic surgery, admitted to the hospital with hospitalist group consulted for assistance with suspected pulmonary edema in addition to assistance with management of patient's atrial fibrillation, diabetes, coronary artery disease.    Patient continued to exhibit substantial anemia on 10/18 requiring an additional 2 units of packed red blood cells transfused.  Hospitalization 93/57 was complicated by worsening leukocytosis and the development of encephalopathy concerning for an underlying infectious process.  Patient was placed on empiric antibiotic therapy which was discontinued on 10/22.  Hospital course also complicated by acute kidney injury of unclear etiology which progressively improved and resolved.  On 10/23 CT venogram was highly suggestive of a dural venous sinus thrombosis.  Patient was initiated on a heparin infusion and transferred to the stepdown unit for close clinical monitoring and serial neurologic checks.     Assessment and Plan: * Dural venous sinus thrombosis CT venogram obtained today at the recommendation of neurology confirmed the presence of a dural venous sinus thrombosis I feel the patient's encephalopathy is at least  in part due to to the presence of this Patient has been placed on a heparin infusion as opposed to enoxaparin due to patient's substantial drop in hemoglobin several days ago. Patient is being transferred to the stepdown unit for close clinical monitoring Patient's been placed on serial neurologic checks every 4 hours  Acute metabolic encephalopathy At this point I feel that the dural venous sinus thrombosis is the most likely cause of the patient's encephalopathy, in combination with hospital delirium that comes from prolonged hospitalization. Ambien, Benadryl and opiates have all been discontinued earlier in the hospitalization. B12 and folate levels unremarkable. Ammonia obtained morning of 10/21 unremarkable despite concerns for possible cirrhosis CT head and MRI brain essentially unremarkable TSH slightly elevated likely secondary to euthyroid sick VBG and ABG reveals no evidence of hypercapnia Neurology continuing to follow, their input is greatly appreciated.  AKI (acute kidney injury) ([place]) Renal function is improving and approaching baseline. Substantial improvement in renal function since placement of a Foley catheter on 10/21 and initiation of Flomax. Patient has a history of BPH with urinary retention in the past That being said, dural venous sinus thrombosis is also been known to cause urinary retention We will plan on discontinuation of Foley catheter and voiding trial on 10/24 Minimizing use of nephrotoxic agents Monitoring renal function and electrolytes with serial chemistry Strict input and output monitoring  Acute on chronic systolic CHF (congestive heart failure) (HCC) Elevated on 10/20 at 1749, now down trended to 1137 on 10/23 Echocardiogram revealing slightly decreased ejection fraction of 45 to 50% revealing some mild systolic dysfunction which was confirmed via focused echo on 10/22. 1 additional dose of intravenous Lasix today, I believe patient is approaching  euvolemia. Per my discussion with the wife on 10/21  patient has been experiencing  increasing shortness of breath for at least the past month even prior to his hip surgery.  Elevated INR Profoundly elevated INR greater than 10 confirmed with repeat testing on 10/21 Substantial improvement in INR with administration of 5 mg of vitamin K on 10/21 and 10/22.   No clinical evidence of bleeding Etiology unclear Clinical picture not consistent with DIC Right upper quadrant ultrasound suggestive of cirrhosis.   Dr. Bryan [name] with gastroenterology was consulted who is not entirely convinced the patient has true cirrhosis.  His input is appreciated.   Furthermore, appreciate Dr. Antonieta [name] input on this apparent coagulopathy   Type 2 diabetes mellitus without complication, without long-term current use of insulin (HCC)-resolved as of 11/06/2021 Patient been placed on Accu-Cheks before every meal and nightly with sliding scale insulin Holding home regimen of hypoglycemics Hemoglobin A1C 5.0% Diabetic Diet   BPH with obstruction/lower urinary tract symptoms Per my review of the chart patient has experienced urinary retention secondary to BPH in 2021 prompting temporary placement of a Foley catheter Substantial improvement in renal function since placement of a Foley catheter on 10/21 and initiation of Flomax. Patient has a history of BPH with urinary retention in the past That being said, dural venous sinus thrombosis is also been known to cause urinary retention We will plan on discontinuation of Foley catheter and voiding trial on 10/24  Acute anemia Hemoglobin now stable Known history of essential thrombocytopenia on Hydrea No clinical evidence of bleeding Total of 4 units of packed red blood cells transfused during this hospitalization on POD 0 and 1 MCV suggests a macrocytic anemia however vitamin B12 and folate from 08/2021 are unremarkable. Monitoring hemoglobin and hematocrit with  serial CBCs Hematology additionally following, their input is appreciated  Chronic atrial fibrillation with RVR (HCC) Improved rate control Continue home regimen of metoprolol as blood pressure tolerates Monitoring patient on telemetry Now on heparin therapy for dural venous sinus thrombosis  Leukocytosis Cultures negative thus far Substantial leukocytosis persisting Infectious disease consultation obtained.  Is believe the patient is not suffering from an infectious process and that no further work-up or empiric antibiotics are necessary. Patient received a 3-day course of intravenous Zosyn from 10/19 until 10/21 which was discontinued due to no evidence of infection Peripheral smear on 10/22 unremarkable  S/P total left hip arthroplasty Status post left hip arthroplasty 10/17 with Dr. Alvan Dame Per orthopedic surgery, patient to be weightbearing as tolerated at this point PT, OT evaluations As needed opiate-based analgesics   Coronary artery disease involving native heart without angina pectoris Continuing metoprolol, statin therapy Patient is currently chest pain-free Monitoring patient on telemetry  Malnutrition of moderate degree Poor oral intake with evidence of fat depletion and weight loss consistent with moderate protein calorie malnutrition Nutrition following, their input is appreciated Nutritional supplements initiated Multivitamin  Mixed diabetic hyperlipidemia associated with type 2 diabetes mellitus ([place]) Continuing home regimen of lipid lowering therapy.   Essential thrombocythemia ([place]) No significant thrombocytopenia at this time Dr. Marin Olp with hematology oncology following, his input is appreciated.  Diuretic-induced hypokalemia Persisting due to ongoing diuresis Continue to provide both oral and intravenous potassium chloride  Hypomagnesemia Replaced     Subjective:  Patient denies pain shortness of breath or headache.  Patient is still  encephalopathic and therefore unable to write appropriate history.  Physical Exam:  Vitals:   11/10/21 1600 11/10/21 1700 11/10/21 1800 11/10/21 1900  BP: (!) 104/46 (!) 117/46 133/68   Pulse:   (!) 116   Resp: 20  16 (!) 29   Temp:    98.1 F (36.7 C)  TempSrc:    Oral  SpO2:   96%   Weight:      Height:        Constitutional: Lethargic but arousable, oriented x2, no associated distress.   Skin: Increased pallor, no rashes, no lesions, good skin turgor noted. Eyes: Pupils are equally reactive to light.  No evidence of scleral icterus or conjunctival pallor.  ENMT: Dry mucous membranes noted.  Posterior pharynx clear of any exudate or lesions.   Respiratory: clear to auscultation bilaterally, no wheezing, no crackles. Normal respiratory effort. No accessory muscle use.  Cardiovascular: Irregularly irregular rate and rhythm, no murmurs / rubs / gallops. No extremity edema. 2+ pedal pulses. No carotid bruits.  Abdomen: Abdomen is soft and nontender.  No evidence of intra-abdominal masses.  Positive bowel sounds noted in all quadrants.   Musculoskeletal: Pain with both passive and active range of motion of the left hip.  Data Reviewed:  I have personally reviewed and interpreted labs, imaging.  Significant findings are   CBC: Recent Labs  Lab 11/06/21 0344 11/07/21 0329 11/08/21 0401 11/09/21 0347 11/10/21 0346  WBC 63.8* 56.3* 45.1* 50.0* 67.4*  NEUTROABS  --  45.2* 37.9* 35.9* 55.9*  HGB 9.9* 8.7* 9.9* 9.5* 10.3*  HCT 30.5* 26.7* 30.8* 29.8* 32.2*  MCV 104.5* 105.1* 105.1* 105.7* 105.2*  PLT 254 261 284 274 916   Basic Metabolic Panel: Recent Labs  Lab 11/06/21 0344 11/07/21 0329 11/08/21 0401 11/09/21 0347 11/09/21 1555 11/10/21 0346 11/10/21 1720  NA 136 134* 136 137 139 140 140  K 4.7 3.7 3.6 2.8* 2.5* 2.6* 3.0*  CL 106 106 108 104 102 103 103  CO2 21* 19* 21* '23 28 25 26  '$ GLUCOSE 124* 97 87 94 90 80 91  BUN 20 25* 27* 25* '23 20 16  '$ CREATININE 2.15*  2.12* 2.04* 1.87* 1.76* 1.53* 1.33*  CALCIUM 8.1* 7.5* 7.8* 7.6* 7.9* 8.1* 8.1*  MG 2.0 1.8 1.9 1.7  --  1.8  --   PHOS  --   --   --   --   --  2.6  --    GFR: Estimated Creatinine Clearance: 48.6 mL/min (A) (by C-G formula based on SCr of 1.33 mg/dL (H)). Liver Function Tests: Recent Labs  Lab 11/06/21 0344 11/07/21 0329 11/08/21 0401 11/09/21 0347 11/10/21 0346  AST 59* 70* 62* 49* 46*  ALT '13 15 15 13 13  '$ ALKPHOS 84 76 91 94 119  BILITOT 1.1 1.1 1.8* 1.9* 2.3*  PROT 5.8* 5.0* 5.1* 5.1* 5.5*  ALBUMIN 2.9* 2.4* 2.5* 2.4* 2.6*    Coagulation Profile: Recent Labs  Lab 11/08/21 0401 11/08/21 0806 11/09/21 0347 11/10/21 0346  INR >10.0* >10.0* 3.3* 1.9*   Telemetry: Personally reviewed.  Rhythm is atrial fibrillation with heart rate of 90 bpm.  No dynamic ST segment changes appreciated.   Code Status:  Full code.   Family Communication: Attempted to contact wife via listed phone number and was unsuccessful.   Severity of Illness:  The appropriate patient status for this patient is INPATIENT. Inpatient status is judged to be reasonable and necessary in order to provide the required intensity of service to ensure the patient's safety. The patient's presenting symptoms, physical exam findings, and initial radiographic and laboratory data in the context of their chronic comorbidities is felt to place them at high risk for further clinical deterioration. Furthermore, it is not anticipated that the  patient will be medically stable for discharge from the hospital within 2 midnights of admission.   * I certify that at the point of admission it is my clinical judgment that the patient will require inpatient hospital care spanning beyond 2 midnights from the point of admission due to high intensity of service, high risk for further deterioration and high frequency of surveillance required.*  Time spent:  56 minutes  Author:  Vernelle Emerald MD  11/10/2021 8:36 PM

## 2021-11-10 NOTE — Progress Notes (Signed)
Patient alert; nods to answer questions; refusing to eat meals or take many oral medications; calm and cooperative; will continue to monitor

## 2021-11-10 NOTE — Progress Notes (Signed)
PT Cancellation Note  Patient Details Name: Justin [name] MRN: [id number] DOB: 25-Oct-1955   Cancelled Treatment:    Reason Eval/Treat Not Completed: Patient at procedure or test/unavailable. Will check back later today if schedule permits.    [facility]  Office: [phone]

## 2021-11-11 ENCOUNTER — Inpatient Hospital Stay (HOSPITAL_COMMUNITY): Payer: Medicare Other

## 2021-11-11 DIAGNOSIS — G08 Intracranial and intraspinal phlebitis and thrombophlebitis: Secondary | ICD-10-CM | POA: Diagnosis not present

## 2021-11-11 LAB — CBC WITH DIFFERENTIAL/PLATELET
Abs Immature Granulocytes: 2.6 10*3/uL — ABNORMAL HIGH (ref 0.00–0.07)
Basophils Absolute: 0 10*3/uL (ref 0.0–0.1)
Basophils Relative: 0 %
Eosinophils Absolute: 0 10*3/uL (ref 0.0–0.5)
Eosinophils Relative: 0 %
HCT: 34.8 % — ABNORMAL LOW (ref 39.0–52.0)
Hemoglobin: 10.9 g/dL — ABNORMAL LOW (ref 13.0–17.0)
Lymphocytes Relative: 4 %
Lymphs Abs: 3.5 10*3/uL (ref 0.7–4.0)
MCH: 34.3 pg — ABNORMAL HIGH (ref 26.0–34.0)
MCHC: 31.3 g/dL (ref 30.0–36.0)
MCV: 109.4 fL — ABNORMAL HIGH (ref 80.0–100.0)
Monocytes Absolute: 9.6 10*3/uL — ABNORMAL HIGH (ref 0.1–1.0)
Monocytes Relative: 11 %
Myelocytes: 3 %
Neutro Abs: 71.9 10*3/uL — ABNORMAL HIGH (ref 1.7–7.7)
Neutrophils Relative %: 82 %
Platelets: 312 10*3/uL (ref 150–400)
RBC: 3.18 MIL/uL — ABNORMAL LOW (ref 4.22–5.81)
RDW: 25.5 % — ABNORMAL HIGH (ref 11.5–15.5)
WBC: 87.7 10*3/uL (ref 4.0–10.5)
nRBC: 2.9 % — ABNORMAL HIGH (ref 0.0–0.2)

## 2021-11-11 LAB — PROTIME-INR
INR: 1.9 — ABNORMAL HIGH (ref 0.8–1.2)
Prothrombin Time: 21.2 seconds — ABNORMAL HIGH (ref 11.4–15.2)

## 2021-11-11 LAB — URIC ACID: Uric Acid, Serum: 10.8 mg/dL — ABNORMAL HIGH (ref 3.7–8.6)

## 2021-11-11 LAB — COMPREHENSIVE METABOLIC PANEL
ALT: 12 U/L (ref 0–44)
AST: 39 U/L (ref 15–41)
Albumin: 2.7 g/dL — ABNORMAL LOW (ref 3.5–5.0)
Alkaline Phosphatase: 136 U/L — ABNORMAL HIGH (ref 38–126)
Anion gap: 12 (ref 5–15)
BUN: 20 mg/dL (ref 8–23)
CO2: 25 mmol/L (ref 22–32)
Calcium: 8.2 mg/dL — ABNORMAL LOW (ref 8.9–10.3)
Chloride: 105 mmol/L (ref 98–111)
Creatinine, Ser: 1.33 mg/dL — ABNORMAL HIGH (ref 0.61–1.24)
GFR, Estimated: 59 mL/min — ABNORMAL LOW (ref >60)
Glucose, Bld: 103 mg/dL — ABNORMAL HIGH (ref 70–99)
Potassium: 3.9 mmol/L (ref 3.5–5.1)
Sodium: 142 mmol/L (ref 135–145)
Total Bilirubin: 2.3 mg/dL — ABNORMAL HIGH (ref 0.3–1.2)
Total Protein: 6 g/dL — ABNORMAL LOW (ref 6.5–8.1)

## 2021-11-11 LAB — CULTURE, BLOOD (ROUTINE X 2)
Culture: NO GROWTH
Culture: NO GROWTH
Special Requests: ADEQUATE
Special Requests: ADEQUATE

## 2021-11-11 LAB — HEPARIN LEVEL (UNFRACTIONATED)
Heparin Unfractionated: 0.42 IU/mL (ref 0.30–0.70)
Heparin Unfractionated: 0.36 IU/mL (ref 0.30–0.70)

## 2021-11-11 LAB — ANTITHROMBIN III: AntiThromb III Func: 58 % — ABNORMAL LOW (ref 75–120)

## 2021-11-11 LAB — MAGNESIUM: Magnesium: 1.8 mg/dL (ref 1.7–2.4)

## 2021-11-11 LAB — VITAMIN B1: Vitamin B1 (Thiamine): 238.9 nmol/L — ABNORMAL HIGH (ref 66.5–200.0)

## 2021-11-11 MED ORDER — HYDROXYUREA 500 MG PO CAPS: 500.0000 mg | ORAL_CAPSULE | Freq: Three times a day (TID) | ORAL | Status: DC

## 2021-11-11 MED ORDER — HYDROXYUREA 500 MG PO CAPS
500.0000 mg | ORAL_CAPSULE | Freq: Two times a day (BID) | ORAL | Status: DC
  Administered 2021-11-11 – 2021-11-16 (×12): 500 mg via ORAL
  Filled 2021-11-11 (×13): qty 1

## 2021-11-11 MED ORDER — IOHEXOL 300 MG/ML  SOLN
100.0000 mL | Freq: Once | INTRAMUSCULAR | Status: AC | PRN
  Administered 2021-11-11: 100 mL via INTRAVENOUS

## 2021-11-11 MED ORDER — ALBUTEROL SULFATE (2.5 MG/3ML) 0.083% IN NEBU
2.5000 mg | INHALATION_SOLUTION | Freq: Two times a day (BID) | RESPIRATORY_TRACT | Status: DC
  Administered 2021-11-11 – 2021-11-12 (×2): 2.5 mg via RESPIRATORY_TRACT
  Filled 2021-11-11 (×2): qty 3

## 2021-11-11 MED ORDER — SODIUM CHLORIDE (PF) 0.9 % IJ SOLN
INTRAMUSCULAR | Status: AC
  Filled 2021-11-11: qty 50

## 2021-11-11 NOTE — Progress Notes (Signed)
Brief Pharmacy Note  66 yo M started on IV heparin 10/23 for new dural venous sinus thrombosis.  He is on apixaban for AFib however this was held for surgery on 10/13 and he has only received x2 doses post-o on 10/19&20.    Coag labs from 10/23 @ 2111 Initial heparin level 0.61- therapeutic on IV heparin 1350 units/hr aPTT= 76 sec; therapeutic and correlating to heparin level so will only monitor using heparin levels going forward  No bleeding or infusion related concerns reported by RN  Continue heparin at current rate.  Check confirmatory levels with morning labs.    Netta Cedars, PharmD, BCPS 11/11/2021'@12'$ :29 AM

## 2021-11-11 NOTE — Progress Notes (Signed)
Unfortunately, his mental status worsened yesterday.  He is now in the ICU.  He had an MRI of the brain.  He had a CT venogram of the brain.  This showed a dural venous thrombus.  I must say that with all the folks I have seen with dural venous thrombosis, cannot recall 1 having such a change in his mental state.  Typically, patients with dural venous thrombus have headaches.  He is on heparin.  This could certainly be reflective of the essential thrombocythemia even though his platelet count is normal.  There is some studies that have suggested that elevated white cell counts increased risk of thrombotic disease and myeloproliferative disorders.  I noted that his white cell count is going up.  I will have to get him onto Hydrea now.  He is clearly worse this morning than when I saw him yesterday.  He went did not know what was.  He thought he was in [place].  He did not know that he had surgery a week ago.  I know he has atrial fibrillation.  I do not know if this may be contributing to the thromboembolic event that he had.  Nutrition is can become a problem for [place].  He really is not eating.  At some point, he may need to have a feeding tube put into him so that he can have enteral feedings.  Currently, his white count is 87.7.  His hemoglobin is 10.9.  Platelet count 312,000.  The potassium 3.9.  His calcium is 8.2 with an albumin of 2.7.  His creatinine is 1.33.  Bilirubin is 2.3.  Cultures have been negative.  He had a chest x-ray yesterday which showed some pulmonary edema.  He has been getting diuresed.  He had 2300 cc urine output yesterday.  His vital signs show temperature of 98.1.  Pulse 97.  Blood pressure 87/45.  His lungs sound relatively clear bilaterally.  I do not hear any wheezing.  Cardiac exam irregular rate and irregular rhythm consistent with the atrial fibrillation.  Abdomen is soft.  Bowel sounds are present.  He has no fluid wave.  There is no guarding or rebound  tenderness.  Extremities shows the left hip surgical scar to be healed.  There is no ecchymoses.  There is no swelling.  Neurological exam shows no focal neurological deficits.  He does have the cognitive impairment.  Again, I am still puzzled as to why the continued change in his mental state.  I probably will have to make sure there is no underlying hypercoagulable condition with him that could also have triggered this dural venous sinus thrombus.  He currently is on heparin.  I will have to keep him on heparin.  He needs to be on Hydrea now.  I really hope that his mental state will improve.  This really is such a surprise as to the altered mental status.  I know that he will get fantastic care from everybody down in the ICU.  Lattie Haw, MD  [facility] [phone]

## 2021-11-11 NOTE — Progress Notes (Signed)
PT Cancellation Note  Patient Details Name: Marcella Dunnaway MRN: [id number] DOB: 1955/10/18   Cancelled Treatment:    Reason Eval/Treat Not Completed: Medical issues which prohibited therapy, per MD. [place] Office [phone] Weekend pager-[phone]    Claretha Cooper 11/11/2021, 1:09 PM

## 2021-11-11 NOTE — Progress Notes (Signed)
ANTICOAGULATION CONSULT NOTE  Pharmacy Consult for heparin Indication:  dural venous sinus thrombosis  Allergies  Allergen Reactions   Codeine Swelling and Other (See Comments)    Tolerates hydrocodone, tramadol, and oxycodone    Dilaudid [Hydromorphone] Anxiety    Hallucinations    Doxycycline Itching and Rash    Patient Measurements: Height: '5\' 6"'$  (167.6 cm) Weight: 68.1 kg (150 lb 2.1 oz) IBW/kg (Calculated) : 63.8 Heparin Dosing Weight: n/a. Use TBW of 68 kg  Vital Signs: Temp: 97.7 F (36.5 C) (10/24 0800) Temp Source: Oral (10/24 0800) BP: 91/58 (10/24 0900) Pulse Rate: 106 (10/24 0900)  Labs: Recent Labs     0000 11/08/21 1012 11/08/21 1137 11/09/21 0347 11/09/21 1555 11/10/21 0346 11/10/21 1720 11/10/21 2111 11/11/21 0305 11/11/21 0306  HGB   < >  --   --  9.5*  --  10.3*  --   --  10.9*  --   HCT  --   --   --  29.8*  --  32.2*  --   --  34.8*  --   PLT  --   --   --  274  --  306  --   --  312  --   APTT  --   --   --  45*  --  39*  --  76*  --   --   LABPROT  --   --   --  33.2*  --  21.9*  --   --  21.2*  --   INR  --   --   --  3.3*  --  1.9*  --   --  1.9*  --   HEPARINUNFRC  --   --   --   --   --   --   --  0.61  --  0.42  CREATININE  --   --   --  1.87*   < > 1.53* 1.33*  --  1.33*  --   TROPONINIHS  --  77* 70*  --   --   --   --   --   --   --    < > = values in this interval not displayed.     Estimated Creatinine Clearance: 48.6 mL/min (A) (by C-G formula based on SCr of 1.33 mg/dL (H)).   Medical History: Past Medical History:  Diagnosis Date   Acute meniscal tear of knee LEFT   Anemia    Aortic stenosis 12/01/2017   NONRHEUMATIC, AORTIC VALVE CALCIFICATIONS, MILD TO MODERATE REGURG, MILD TO MODERATE CALCIFIED ANNULUS per ECHO 10/25/17 @ [facility] [address]   Arthritis    Atrial fibrillation ([place]) 11/12/2017   AT O/V WITH PCP   Cancer ([place])    skin right arm   Coronary artery disease    Dyspnea    GERD (gastroesophageal reflux  disease)    Headache    hx of migraines   Heart murmur MILD-- ASYMPTOMATIC   History of kidney stones    Hyperlipidemia    Hypertension    Left knee pain    Mixed diabetic hyperlipidemia associated with type 2 diabetes mellitus ([place]) 11/05/2021   PAD (peripheral artery disease) ([place])    left leg claudication   Sleep apnea    Assessment: 87 yoM admitted for left THA on 10/17. Pt was on apixaban PTA for atrial fibrillation, which was held on 10/13 prior to surgery. Post-op course complicated by severe post-op blood loss, resulting in apixaban  being held until 10/19. Pt received four doses of apixaban (10/19, 10/20), but it was held again starting 10/21.   Neurology was consulted for confusion/encephalopathy on 10/21. MRI Brain done 10/21, CT Venogram head done 10/23 suspicious for dural venous sinus thrombosis. Pharmacy consulted to initiate heparin drip.   Significant Events/Discussion: 10/23: HL and aPTT correlating within therapeutic range 10/24: Contacted by neurologist (Dr Lorrin Goodell) to request maintain heparin at low end of goal range (0.3-0.5) due to high bleeding risk   11/11/2021: Confirmatory HL = 0.36 remains therapeutic on heparin infusion of 1350 units/hr CBC: Hgb (10.3) low but improving, Plt WNL Confirmed with RN that heparin infusing at correct rate. No signs of bleeding. No line issues/interruptions.  Goal of Therapy:  Heparin level 0.3-0.5 per neurology Monitor platelets by anticoagulation protocol: Yes   Plan:  Continue heparin infusion at 1350 units/hr CBC, HL daily Monitor for signs of bleeding Follow for long-term anticoagulation plan  Lenis Noon PharmD 11/11/2021,9:27 AM

## 2021-11-11 NOTE — Progress Notes (Signed)
ANTICOAGULATION CONSULT NOTE  Pharmacy Consult for Heparin Indication:  dural venous sinus thrombosis  Allergies  Allergen Reactions   Codeine Swelling and Other (See Comments)    Tolerates hydrocodone, tramadol, and oxycodone    Dilaudid [Hydromorphone] Anxiety    Hallucinations    Doxycycline Itching and Rash    Patient Measurements: Height: '5\' 6"'$  (167.6 cm) Weight: 68.1 kg (150 lb 2.1 oz) IBW/kg (Calculated) : 63.8 Heparin Dosing Weight: 75.8 kg  Vital Signs: Temp: 98.1 F (36.7 C) (10/23 1900) Temp Source: Oral (10/23 1900) BP: 117/73 (10/24 0100) Pulse Rate: 106 (10/24 0100)  Labs: Recent Labs    11/08/21 0401 11/08/21 0806 11/08/21 1012 11/08/21 1137 11/09/21 0347 11/09/21 1555 11/10/21 0346 11/10/21 1720 11/10/21 2111 11/11/21 0305 11/11/21 0306  HGB 9.9*  --   --   --  9.5*  --  10.3*  --   --   --   --   HCT 30.8*  --   --   --  29.8*  --  32.2*  --   --   --   --   PLT 284  --   --   --  274  --  306  --   --   --   --   APTT 56*   < >  --   --  45*  --  39*  --  76*  --   --   LABPROT >90.0*   < >  --   --  33.2*  --  21.9*  --   --  21.2*  --   INR >10.0*   < >  --   --  3.3*  --  1.9*  --   --  1.9*  --   HEPARINUNFRC  --   --   --   --   --   --   --   --  0.61  --  0.42  CREATININE 2.04*  --   --   --  1.87* 1.76* 1.53* 1.33*  --   --   --   TROPONINIHS  --   --  77* 70*  --   --   --   --   --   --   --    < > = values in this interval not displayed.     Estimated Creatinine Clearance: 48.6 mL/min (A) (by C-G formula based on SCr of 1.33 mg/dL (H)).   Medical History: Past Medical History:  Diagnosis Date   Acute meniscal tear of knee LEFT   Anemia    Aortic stenosis 12/01/2017   NONRHEUMATIC, AORTIC VALVE CALCIFICATIONS, MILD TO MODERATE REGURG, MILD TO MODERATE CALCIFIED ANNULUS per ECHO 10/25/17 @ [facility] [address]   Arthritis    Atrial fibrillation ([place]) 11/12/2017   AT O/V WITH PCP   Cancer ([place])    skin right arm   Coronary  artery disease    Dyspnea    GERD (gastroesophageal reflux disease)    Headache    hx of migraines   Heart murmur MILD-- ASYMPTOMATIC   History of kidney stones    Hyperlipidemia    Hypertension    Left knee pain    Mixed diabetic hyperlipidemia associated with type 2 diabetes mellitus ([place]) 11/05/2021   PAD (peripheral artery disease) ([place])    left leg claudication   Sleep apnea    Assessment: 62 yoM s/p left THA on 10/17.  He was on Eliquis PTA for Afib which was  held on 10/13 for procedure and resumed post-op on 10/19.  He had severe post-op blood loss and Eliqus was held again due to anemia on 10/20 after 2 doses.   MRI with new dural venous sinus thrombosis to start IV heparin. Hgb 10.3 ok, Plts 306 ok. Scr 1.53 down.  11/11/2021: Contacted by neurologist (Dr Lorrin Goodell) to request maintain heparin at low end of goal range (0.3-0.5) due to high bleeding risk Heparin level 0.42- therapeutic on IV heparin 1350 units/hr CBC: Hg 10.9-low but improving , pltc WNL No bleeding or infusion related concerns reported by RN  Goal of Therapy:  Heparin level 0.3-0.5 per neurology Monitor platelets by anticoagulation protocol: Yes   Plan:  Continue IV heparin infusion at 1350 units/hr  Daily HL and CBC Monitor closely for bleeding F/U long-term anticoagulation plan  Netta Cedars PharmD 11/11/2021,3:45 AM

## 2021-11-11 NOTE — Progress Notes (Signed)
Patient ID: Justin [name], male   DOB: Nov 30, 1955, 66 y.o.   MRN: [id number] Subjective: 7 Days Post-Op Procedure(s) (LRB): TOTAL HIP ARTHROPLASTY ANTERIOR APPROACH (Left)   Seems comfortable When I walked into his room this afternoon he said "hello [name]". Continues to express some memory related issues however  Objective:   VITALS:   Vitals:   11/11/21 1100 11/11/21 1200  BP: 119/68 121/66  Pulse: 77 (!) 121  Resp: (!) 21 19  Temp:  (!) 97.2 F (36.2 C)  SpO2: (!) 82% 90%    Neurovascular intact Incision: dressing C/D/I  LABS Recent Labs    11/09/21 0347 11/10/21 0346 11/11/21 0305  HGB 9.5* 10.3* 10.9*  HCT 29.8* 32.2* 34.8*  WBC 50.0* 67.4* 87.7*  PLT 274 306 312    Recent Labs    11/10/21 0346 11/10/21 1720 11/11/21 0305  NA 140 140 142  K 2.6* 3.0* 3.9  BUN '20 16 20  '$ CREATININE 1.53* 1.33* 1.33*  GLUCOSE 80 91 103*    Recent Labs    11/10/21 0346 11/11/21 0305  INR 1.9* 1.9*     Assessment/Plan: 7 Days Post-Op Procedure(s) (LRB): TOTAL HIP ARTHROPLASTY ANTERIOR APPROACH (Left)   Advance diet Up with therapy  We appreciate the care he has been receiving On heparin gtt currently - despite this his hip appears stable Hoping that this will help with his acute mental status change  Needs to eat and get up with therapy as he has been in bed for a week now

## 2021-11-11 NOTE — IPAL (Signed)
  Interdisciplinary Goals of Care Family Meeting   Date carried out: 11/11/2021  Location of the meeting: Phone conference  Member's involved: Physician and Family Member or next of kin  Durable Power of Attorney or acting medical decision maker: Wife, [name]  Discussion: We discussed goals of care for [name] .  Wife was informed once again as to patient's grave clinical situation with a prolonged and complicated hospital course and numerous life-threatening conditions.  Prognosis was extremely guarded with high risk of clinical deterioration and even death.  CODE STATUS was discussed.  Wife is confirmed that patient wishes to be DNR.  Order has been placed in the chart.  Goals of care were also discussed with the hypothetical situation the patient clinically deteriorated.  Wife explains that patient has made it clear in the past that if "he gets to the point where he will not be able to walk out of the hospital or will be unable to wake up and be himself again he wants everything stopped."  In other words, she still wishes to pursue all therapeutic measures at this point but if patient clinically deteriorates with a little chance of recovery she would like to move towards hospice/comfort measures.  Code status: Full DNR  Disposition: Continue current acute care  Time spent for the meeting: 23 minutes    Vernelle Emerald, MD  11/11/2021, 10:19 PM

## 2021-11-11 NOTE — Progress Notes (Signed)
PROGRESS NOTE   Justin [name]  ZDG:[id number] DOB: 09-10-1955 DOA: 11/04/2021 PCP: [name], MD   Date of Service: the patient was seen and examined on 11/11/2021  Brief Narrative:  66 y.o. male with history of essential thrombocythemia, paroxysmal atrial fibrillation on Eliquis, coronary artery disease status post CABG history of functional disability to the left hip secondary to end-stage arthritis, failed outpatient nonsurgical conservative treatment greater than 12 weeks ago was admitted to the orthopedic service for left total hip arthroplasty.    Patient noted to have had significant blood loss in the perioperative period and received 2 units packed transfusions perioperatively on 10/17.  Patient initiated on tranexamic acid by orthopedic surgery, admitted to the hospital with hospitalist group consulted for assistance with suspected pulmonary edema in addition to assistance with management of patient's atrial fibrillation, diabetes, coronary artery disease.    Patient continued to exhibit substantial anemia on 10/18 requiring an additional 2 units of packed red blood cells transfused.  Hospitalization 59/56 was complicated by worsening leukocytosis and the development of encephalopathy.  Initial work-up for encephalopathy was completely negative.  Patient was placed on empiric antibiotic therapy 10/19 which was discontinued on 10/22.  After work-up was found to be negative.  Leukocytosis has progressively worsened throughout the hospitalization and risen to remarkable levels.  Infectious disease consultation on 10/23 was obtained and it was felt the patient was not suffering from an underlying infectious process as the cause.  Due to persisting encephalopathy on 10/23 CT venogram was obtained and found to be highly suggestive of a dural venous sinus thrombosis.  Patient was initiated on a heparin infusion and transferred to the stepdown unit for close clinical monitoring and  serial neurologic checks.   Hospital course also complicated by acute kidney injury of likely multifactorial etiology which progressively improved and resolved  Due to flank bruising on physical exam on 10/24 heparin infusion was temporarily held while CT imaging of the abdomen pelvis was performed.  No retroperitoneal hemorrhage was identified and therefore heparin was resumed.     Assessment and Plan: * Dural venous sinus thrombosis CT venogram 10/23 confirmed the presence of a dural venous sinus thrombosis Atypical presentation as patient's primary symptoms have been encephalopathy and urinary retention, not headache Since initiation of heparin on 10/23, patient's encephalopathy has improved but is a ways off from complete resolution Monitoring patient in the stepdown unit for any evidence of seizure activity or bleeding complications Discussed case with Dr. Quinn Axe today, she is getting an EEG to ensure there is no evidence of seizure activity Continuing serial neurologic checks Patient has been placed on a heparin infusion as opposed to enoxaparin due to patient's substantial drop in hemoglobin shortly after surgery  Dr. Marin Olp has initiated a hypercoagulable work-up today and has additionally initiated hydroxyurea  Acute metabolic encephalopathy At this point I feel that the dural venous sinus thrombosis seen on CTV 10/23 is the most likely cause of the patient's encephalopathy, in combination with hospital delirium that comes from prolonged hospitalization. Ambien, Benadryl and opiates were all discontinued earlier in the hospitalization. B12 and folate levels unremarkable. Ammonia obtained morning of 10/21 unremarkable despite concerns for possible cirrhosis CT head and MRI brain essentially unremarkable TSH slightly elevated likely secondary to euthyroid sick VBG and ABG reveals no evidence of hypercapnia Neurology continuing to follow, their input is greatly appreciated.  AKI  (acute kidney injury) ([place]) Renal function is improved to baseline. AKI multifactorial Substantial improvement in renal function since placement  of a Foley catheter on 10/21 and initiation of Flomax. Patient has a history of BPH with urinary retention in the past per review of old records CT imaging of the abdomen and pelvis revealing circumferential bladder wall thickening concerning for chronic urinary retention That being said, dural venous sinus thrombosis is also been known to cause urinary retention Temporarily removing Foley catheter for voiding trial today.   Minimizing use of nephrotoxic agents Monitoring renal function and electrolytes with serial chemistry Strict input and output monitoring  Acute on chronic systolic CHF (congestive heart failure) (HCC) Dry mucous membranes and dark urine on exam with concurrent bibasilar rales and diffuse pitting edema consistent with intravascular depletion with anasarca. Elevated on 10/20 at 1749, now down trended to 1137 on 10/23 Patient was aggressively diuresed with intravenous Lasix from 10/20 until morning of 10/23 Patient continues to exhibit significant bilateral pleural effusions with evidence of anasarca on exam and on CT imaging.   However I feel the majority of this volume overload is now due to third spacing from malnutrition/possible cirrhosis and not acute congestive heart failure  echocardiogram revealing slightly decreased ejection fraction of 45 to 50% revealing some mild systolic dysfunction which was confirmed via focused echo on 10/22. Per my discussion with the wife on 10/21  patient has been experiencing increasing shortness of breath for at least the past month even prior to his hip surgery.  Elevated INR Profoundly elevated INR greater than 10 confirmed with repeat testing on 10/21 Substantial improvement in INR with administration of 5 mg of vitamin K on 10/21 and additional '5mg'$  on 10/22.   No clinical evidence of  bleeding Etiology unclear but likely multifactorial Clinical picture not consistent with DIC Right upper quadrant ultrasound suggestive of cirrhosis on 10/20 Evidence of significant splenomegaly on CT imaging of the abdomen and pelvis 10/24 Dr. Bryan Lemma with gastroenterology was consulted who is not entirely convinced the patient has true cirrhosis.  His input is appreciated.   Furthermore, appreciate Dr. Antonieta Pert input on this apparent coagulopathy   Type 2 diabetes mellitus without complication, without long-term current use of insulin (HCC)-resolved as of 11/06/2021 Patient been placed on Accu-Cheks before every meal and nightly with sliding scale insulin Holding home regimen of hypoglycemics Hemoglobin A1C 5.0% Diabetic Diet   BPH with obstruction/lower urinary tract symptoms Per my review of the chart patient has experienced urinary retention secondary to BPH in 2021 prompting temporary placement of a Foley catheter Patient's Flomax was held for several days after surgery and was resumed on 10/21 Patient has a history of BPH with urinary retention in the past That being said, dural venous sinus thrombosis is also been known to cause urinary retention Foley catheter removal today with a voiding trial  Acute anemia Hemoglobin now stable Known history of essential thrombocytopenia on Hydrea No clinical evidence of bleeding Total of 4 units of packed red blood cells transfused during this hospitalization on POD 0 and 1 MCV suggests a macrocytic anemia however vitamin B12 and folate from 08/2021 are unremarkable. Monitoring hemoglobin and hematocrit with serial CBCs Hematology additionally following, their input is appreciated  Chronic atrial fibrillation with RVR (HCC) Improved rate control Continue home regimen of metoprolol as blood pressure tolerates Monitoring patient on telemetry Now on heparin therapy for dural venous sinus thrombosis  Leukocytosis Worsening  leukocytosis, impressively high Cultures negative thus far Infectious disease consultation obtained 10/23 They believed at the time the patient is not suffering from an infectious process and that no further  work-up or empiric antibiotics are necessary. Patient received a 3-day course of intravenous Zosyn from 10/19 until 10/21 which was discontinued due to no evidence of infection Peripheral smear on 10/22 unremarkable Patient placed back on hydroxyurea 10/24 by Dr. Marin Olp  S/P total left hip arthroplasty Status post left hip arthroplasty 10/17 with Dr. Alvan Dame Per orthopedic surgery, patient to be weightbearing as tolerated at this point PT, OT evaluations Limited use of opiate-based analgesics due to ongoing delirium   Coronary artery disease involving native heart without angina pectoris Continuing metoprolol, statin therapy Patient is currently chest pain-free Monitoring patient on telemetry  Protein-calorie malnutrition, severe ([place]) Poor oral intake with evidence of fat depletion and weight loss consistent with severe protein calorie malnutrition Poor nutritional status is contributing to profound anasarca/third spacing Nutrition following, their input is appreciated Nutritional supplements initiated Multivitamin  Mixed diabetic hyperlipidemia associated with type 2 diabetes mellitus ([place]) Continuing home regimen of lipid lowering therapy.   Essential thrombocythemia ([place]) No significant thrombocytosis at this time Dr. Marin Olp with hematology oncology following, his input is appreciated. Patient initiated on hydroxyurea 10/24.  Diuretic-induced hypokalemia Markedly improved  Hypomagnesemia Replaced     Subjective:    Physical Exam:  Vitals:   11/11/21 1603 11/11/21 1700 11/11/21 1900 11/11/21 2010  BP:  102/70 125/65   Pulse:  (!) 33    Resp:  (!) 30 (!) 22   Temp: (!) 97.5 F (36.4 C)   97.7 F (36.5 C)  TempSrc: Oral   Oral  SpO2:  (!) 68%     Weight:      Height:        Constitutional: Lethargic but arousable and oriented x2, no associated distress.   Skin: Substantial left flank ecchymosis noted.  Increased skin pallor noted, Eyes: Pupils are equally reactive to light.  No evidence of scleral icterus or conjunctival pallor.  ENMT: Moist mucous membranes noted.  Posterior pharynx clear of any exudate or lesions.   Respiratory: Markedly diminished breath sounds at the bases with bibasilar rales.   Cardiovascular: Regularly irregular rhythm, controlled rate,, no murmurs / rubs / gallops. No extremity edema. 2+ pedal pulses. No carotid bruits.  Abdomen: Abdomen is protuberant but soft and nontender.  No evidence of intra-abdominal masses.  Positive bowel sounds noted in all quadrants.   Musculoskeletal: No joint deformity upper and lower extremities. Good ROM, no contractures. Normal muscle tone.    Data Reviewed:  I have personally reviewed and interpreted labs, imaging.  Significant findings are   CBC: Recent Labs  Lab 11/07/21 0329 11/08/21 0401 11/09/21 0347 11/10/21 0346 11/11/21 0305  WBC 56.3* 45.1* 50.0* 67.4* 87.7*  NEUTROABS 45.2* 37.9* 35.9* 55.9* 71.9*  HGB 8.7* 9.9* 9.5* 10.3* 10.9*  HCT 26.7* 30.8* 29.8* 32.2* 34.8*  MCV 105.1* 105.1* 105.7* 105.2* 109.4*  PLT 261 284 274 306 505   Basic Metabolic Panel: Recent Labs  Lab 11/07/21 0329 11/08/21 0401 11/09/21 0347 11/09/21 1555 11/10/21 0346 11/10/21 1720 11/11/21 0305  NA 134* 136 137 139 140 140 142  K 3.7 3.6 2.8* 2.5* 2.6* 3.0* 3.9  CL 106 108 104 102 103 103 105  CO2 19* 21* '23 28 25 26 25  '$ GLUCOSE 97 87 94 90 80 91 103*  BUN 25* 27* 25* '23 20 16 20  '$ CREATININE 2.12* 2.04* 1.87* 1.76* 1.53* 1.33* 1.33*  CALCIUM 7.5* 7.8* 7.6* 7.9* 8.1* 8.1* 8.2*  MG 1.8 1.9 1.7  --  1.8  --  1.8  PHOS  --   --   --   --  2.6  --   --    GFR: Estimated Creatinine Clearance: 48.6 mL/min (A) (by C-G formula based on SCr of 1.33 mg/dL (H)). Liver  Function Tests: Recent Labs  Lab 11/07/21 0329 11/08/21 0401 11/09/21 0347 11/10/21 0346 11/11/21 0305  AST 70* 62* 49* 46* 39  ALT '15 15 13 13 12  '$ ALKPHOS 76 91 94 119 136*  BILITOT 1.1 1.8* 1.9* 2.3* 2.3*  PROT 5.0* 5.1* 5.1* 5.5* 6.0*  ALBUMIN 2.4* 2.5* 2.4* 2.6* 2.7*    Coagulation Profile: Recent Labs  Lab 11/08/21 0401 11/08/21 0806 11/09/21 0347 11/10/21 0346 11/11/21 0305  INR >10.0* >10.0* 3.3* 1.9* 1.9*     EKG/Telemetry: Personally reviewed.  Rhythm is atrial fibrillation with heart rate of 104 bpm.  No dynamic ST segment changes appreciated.   Code Status:  DNR.  Code status decision has been confirmed with: wife  Family Communication: Plan of care discussed with wife via phone conversation   Severity of Illness:  The appropriate patient status for this patient is INPATIENT. Inpatient status is judged to be reasonable and necessary in order to provide the required intensity of service to ensure the patient's safety. The patient's presenting symptoms, physical exam findings, and initial radiographic and laboratory data in the context of their chronic comorbidities is felt to place them at high risk for further clinical deterioration. Furthermore, it is not anticipated that the patient will be medically stable for discharge from the hospital within 2 midnights of admission.   * I certify that at the point of admission it is my clinical judgment that the patient will require inpatient hospital care spanning beyond 2 midnights from the point of admission due to high intensity of service, high risk for further deterioration and high frequency of surveillance required.*  Time spent:  70 minutes  Author:  Vernelle Emerald MD  11/11/2021 10:16 PM

## 2021-11-12 ENCOUNTER — Inpatient Hospital Stay (HOSPITAL_COMMUNITY)
Admission: RE | Admit: 2021-11-12 | Discharge: 2021-11-12 | Disposition: A | Discharge status: Home / Self Care | Payer: Medicare Other | Source: Home / Self Care | Attending: Orthopedic Surgery | Admitting: Orthopedic Surgery

## 2021-11-12 ENCOUNTER — Inpatient Hospital Stay (HOSPITAL_COMMUNITY): Payer: Medicare Other

## 2021-11-12 DIAGNOSIS — N401 Enlarged prostate with lower urinary tract symptoms: Secondary | ICD-10-CM

## 2021-11-12 DIAGNOSIS — R4182 Altered mental status, unspecified: Secondary | ICD-10-CM | POA: Diagnosis not present

## 2021-11-12 DIAGNOSIS — E440 Moderate protein-calorie malnutrition: Secondary | ICD-10-CM | POA: Insufficient documentation

## 2021-11-12 DIAGNOSIS — N138 Other obstructive and reflux uropathy: Secondary | ICD-10-CM

## 2021-11-12 DIAGNOSIS — G08 Intracranial and intraspinal phlebitis and thrombophlebitis: Secondary | ICD-10-CM | POA: Diagnosis not present

## 2021-11-12 DIAGNOSIS — G9341 Metabolic encephalopathy: Secondary | ICD-10-CM | POA: Diagnosis not present

## 2021-11-12 DIAGNOSIS — E876 Hypokalemia: Secondary | ICD-10-CM

## 2021-11-12 DIAGNOSIS — Z96642 Presence of left artificial hip joint: Secondary | ICD-10-CM | POA: Diagnosis not present

## 2021-11-12 DIAGNOSIS — I5023 Acute on chronic systolic (congestive) heart failure: Secondary | ICD-10-CM | POA: Diagnosis not present

## 2021-11-12 DIAGNOSIS — R338 Other retention of urine: Secondary | ICD-10-CM

## 2021-11-12 DIAGNOSIS — D62 Acute posthemorrhagic anemia: Secondary | ICD-10-CM | POA: Diagnosis not present

## 2021-11-12 DIAGNOSIS — E43 Unspecified severe protein-calorie malnutrition: Secondary | ICD-10-CM

## 2021-11-12 DIAGNOSIS — T502X5A Adverse effect of carbonic-anhydrase inhibitors, benzothiadiazides and other diuretics, initial encounter: Secondary | ICD-10-CM

## 2021-11-12 LAB — CBC
HCT: 32.9 % — ABNORMAL LOW (ref 39.0–52.0)
Hemoglobin: 10.3 g/dL — ABNORMAL LOW (ref 13.0–17.0)
MCH: 34.7 pg — ABNORMAL HIGH (ref 26.0–34.0)
MCHC: 31.3 g/dL (ref 30.0–36.0)
MCV: 110.8 fL — ABNORMAL HIGH (ref 80.0–100.0)
Platelets: 261 10*3/uL (ref 150–400)
RBC: 2.97 MIL/uL — ABNORMAL LOW (ref 4.22–5.81)
RDW: 26.2 % — ABNORMAL HIGH (ref 11.5–15.5)
WBC: 69.8 10*3/uL (ref 4.0–10.5)
nRBC: 1.6 % — ABNORMAL HIGH (ref 0.0–0.2)

## 2021-11-12 LAB — COMPREHENSIVE METABOLIC PANEL
ALT: 11 U/L (ref 0–44)
AST: 28 U/L (ref 15–41)
Albumin: 2.4 g/dL — ABNORMAL LOW (ref 3.5–5.0)
Alkaline Phosphatase: 118 U/L (ref 38–126)
Anion gap: 9 (ref 5–15)
BUN: 21 mg/dL (ref 8–23)
CO2: 25 mmol/L (ref 22–32)
Calcium: 7.7 mg/dL — ABNORMAL LOW (ref 8.9–10.3)
Chloride: 102 mmol/L (ref 98–111)
Creatinine, Ser: 1.1 mg/dL (ref 0.61–1.24)
GFR, Estimated: >60 mL/min (ref >60)
Glucose, Bld: 115 mg/dL — ABNORMAL HIGH (ref 70–99)
Potassium: 3.5 mmol/L (ref 3.5–5.1)
Sodium: 136 mmol/L (ref 135–145)
Total Bilirubin: 2.1 mg/dL — ABNORMAL HIGH (ref 0.3–1.2)
Total Protein: 5.2 g/dL — ABNORMAL LOW (ref 6.5–8.1)

## 2021-11-12 LAB — CARDIOLIPIN ANTIBODIES, IGG, IGM, IGA
Anticardiolipin IgA: <9 APL U/mL (ref 0–11)
Anticardiolipin IgG: <9 GPL U/mL (ref 0–14)
Anticardiolipin IgM: 40 MPL U/mL — ABNORMAL HIGH (ref 0–12)

## 2021-11-12 LAB — HOMOCYSTEINE: Homocysteine: 8 umol/L (ref 0.0–17.2)

## 2021-11-12 LAB — HEPARIN LEVEL (UNFRACTIONATED)
Heparin Unfractionated: 0.24 IU/mL — ABNORMAL LOW (ref 0.30–0.70)
Heparin Unfractionated: 0.32 IU/mL (ref 0.30–0.70)
Heparin Unfractionated: 0.22 IU/mL — ABNORMAL LOW (ref 0.30–0.70)

## 2021-11-12 LAB — MAGNESIUM: Magnesium: 1.6 mg/dL — ABNORMAL LOW (ref 1.7–2.4)

## 2021-11-12 MED ORDER — ALBUMIN HUMAN 25 % IV SOLN: 25.0000 g | Freq: Four times a day (QID) | INTRAVENOUS | Status: DC

## 2021-11-12 MED ORDER — POTASSIUM CHLORIDE CRYS ER 10 MEQ PO TBCR
20.0000 meq | EXTENDED_RELEASE_TABLET | Freq: Once | ORAL | Status: AC
  Administered 2021-11-12: 20 meq via ORAL
  Filled 2021-11-12: qty 2

## 2021-11-12 MED ORDER — MAGNESIUM SULFATE 4 GM/100ML IV SOLN
4.0000 g | Freq: Once | INTRAVENOUS | Status: AC
  Administered 2021-11-12: 4 g via INTRAVENOUS
  Filled 2021-11-12: qty 100

## 2021-11-12 MED ORDER — ALBUMIN HUMAN 25 % IV SOLN
25.0000 g | Freq: Four times a day (QID) | INTRAVENOUS | Status: AC
  Administered 2021-11-12 – 2021-11-13 (×4): 25 g via INTRAVENOUS
  Filled 2021-11-12 (×4): qty 100

## 2021-11-12 MED ORDER — FUROSEMIDE 10 MG/ML IJ SOLN
20.0000 mg | Freq: Two times a day (BID) | INTRAMUSCULAR | Status: DC
  Administered 2021-11-12 – 2021-11-13 (×3): 20 mg via INTRAVENOUS
  Filled 2021-11-12 (×3): qty 2

## 2021-11-12 NOTE — Progress Notes (Signed)
PROGRESS NOTE/consult    Justin [name]  ACZ:[id number] DOB: [birth year] DOA: 11/04/2021 PCP: [name], MD    No chief complaint on file.   Brief Narrative:  66 y.o. male with history of essential thrombocythemia, paroxysmal atrial fibrillation on Eliquis, coronary artery disease status post CABG history of functional disability to the left hip secondary to end-stage arthritis, failed outpatient nonsurgical conservative treatment greater than 12 weeks ago was admitted to the orthopedic service for left total hip arthroplasty.    Patient noted to have had significant blood loss in the perioperative period and received 2 units packed transfusions perioperatively on 10/17.  Patient initiated on tranexamic acid by orthopedic surgery, admitted to the hospital with hospitalist group consulted for assistance with suspected pulmonary edema in addition to assistance with management of patient's atrial fibrillation, diabetes, coronary artery disease.    Patient continued to exhibit substantial anemia on 10/18 requiring an additional 2 units of packed red blood cells transfused.  Hospitalization 10/93 was complicated by worsening leukocytosis and the development of encephalopathy.  Initial work-up for encephalopathy was completely negative.  Patient was placed on empiric antibiotic therapy 10/19 which was discontinued on 10/22.  After work-up was found to be negative.  Leukocytosis has progressively worsened throughout the hospitalization and risen to remarkable levels.  Infectious disease consultation on 10/23 was obtained and it was felt the patient was not suffering from an underlying infectious process as the cause.  Due to persisting encephalopathy on 10/23 CT venogram was obtained and found to be highly suggestive of a dural venous sinus thrombosis.  Patient was initiated on a heparin infusion and transferred to the stepdown unit for close clinical monitoring and serial neurologic  checks.   Hospital course also complicated by acute kidney injury of likely multifactorial etiology which progressively improved and resolved  Due to flank bruising on physical exam on 10/24 heparin infusion was temporarily held while CT imaging of the abdomen pelvis was performed.  No retroperitoneal hemorrhage was identified and therefore heparin was resumed.      Assessment & Plan:  Principal Problem:   Dural venous sinus thrombosis Active Problems:   Acute metabolic encephalopathy   AKI (acute kidney injury) ([place])   Acute on chronic systolic CHF (congestive heart failure) (HCC)   Elevated INR   BPH with obstruction/lower urinary tract symptoms   Acute anemia   Leukocytosis   Chronic atrial fibrillation with RVR (HCC)   S/P total left hip arthroplasty   Coronary artery disease involving native heart without angina pectoris   Essential thrombocythemia (HCC)   Mixed diabetic hyperlipidemia associated with type 2 diabetes mellitus (HCC)   Protein-calorie malnutrition, severe (HCC)   Diuretic-induced hypokalemia   Hypomagnesemia   Acute urinary retention   Mixed hyperlipidemia   Acute postoperative anemia due to expected blood loss   Malnutrition of moderate degree    Assessment and Plan: * Dural venous sinus thrombosis CT venogram 10/23 confirmed the presence of a dural venous sinus thrombosis Atypical presentation as patient's primary symptoms have been encephalopathy and urinary retention, not headache Since initiation of heparin on 10/23, patient's encephalopathy has improved since only, but is a ways off from complete resolution Monitoring patient in the stepdown unit for any evidence of seizure activity or bleeding complications EEG pending. Discussed case with Dr. Quinn Axe, neurology, she is getting an EEG to ensure there is no evidence of seizure activity Continuing serial neurologic checks Patient has been placed on a heparin infusion as opposed to enoxaparin due  to  patient's substantial drop in hemoglobin shortly after surgery  Dr. Marin Olp has initiated a hypercoagulable work-up and has additionally initiated hydroxyurea Neurology, hematology/oncology following and appreciate input and recommendations.  Acute metabolic encephalopathy At this point I feel that the dural venous sinus thrombosis seen on CTV 10/23 is the most likely cause of the patient's encephalopathy, in combination with hospital delirium that comes from prolonged hospitalization. Ambien, Benadryl and opiates were all discontinued earlier in the hospitalization. B12 and folate levels unremarkable. Ammonia obtained on 10/21 unremarkable despite concerns for possible cirrhosis CT head and MRI brain essentially unremarkable TSH slightly elevated likely secondary to euthyroid sick VBG and ABG reveals no evidence of hypercapnia Patient slowly improving clinically. EEG pending. Neurology continuing to follow, their input is greatly appreciated.  AKI (acute kidney injury) ([place]) Renal function is improved to baseline. AKI multifactorial Substantial improvement in renal function since placement of a Foley catheter on 10/21 and initiation of Flomax. Patient has a history of BPH with urinary retention in the past per review of old records CT imaging of the abdomen and pelvis revealing circumferential bladder wall thickening concerning for chronic urinary retention That being said, dural venous sinus thrombosis is also been known to cause urinary retention Foley catheter removed however patient failed voiding trial and as such we will place Foley catheter back in.   -Discontinue bethanechol.   Monitor urine output. Minimizing use of nephrotoxic agents Monitoring renal function and electrolytes with serial chemistry Strict input and output monitoring  Acute on chronic systolic CHF (congestive heart failure) (HCC) Dry mucous membranes and dark urine on exam with concurrent bibasilar rales and  diffuse pitting edema consistent with intravascular depletion with anasarca. Elevated on 10/20 at 1749, now down trended to 1137 on 10/23 Patient was aggressively diuresed with intravenous Lasix from 10/20 until morning of 10/23 Patient continues to exhibit significant bilateral pleural effusions with evidence of anasarca on exam and on CT imaging.   However I feel the majority of this volume overload is now due to third spacing from malnutrition/possible cirrhosis and not acute congestive heart failure  echocardiogram revealing slightly decreased ejection fraction of 45 to 50% revealing some mild systolic dysfunction which was confirmed via focused echo on 10/22. Per Dr Darrick Grinder discussion with the wife on 10/21  patient has been experiencing increasing shortness of breath for at least the past month even prior to his hip surgery. Repeat chest x-ray this morning still with concerns for pulmonary edema and as such we will place on Lasix 20 mg IV every 12 hours in addition to IV albumin scheduled every 6 hours. Monitor urine output. I's and O's.  Elevated INR Profoundly elevated INR greater than 10 confirmed with repeat testing on 10/21 Substantial improvement in INR with administration of 5 mg of vitamin K on 10/21 and additional '5mg'$  on 10/22.   No clinical evidence of bleeding Etiology unclear but likely multifactorial Clinical picture not consistent with DIC Right upper quadrant ultrasound suggestive of cirrhosis on 10/20 Evidence of significant splenomegaly on CT imaging of the abdomen and pelvis 10/24 Dr. Bryan Lemma with gastroenterology was consulted who is not entirely convinced the patient has true cirrhosis.  His input is appreciated.   Furthermore, appreciate Dr. Antonieta Pert input on this apparent coagulopathy   Type 2 diabetes mellitus without complication, without long-term current use of insulin (HCC)-resolved as of 11/06/2021 Patient been placed on Accu-Cheks before every meal  and nightly with sliding scale insulin Holding home regimen of hypoglycemics Hemoglobin A1C  5.0% Diabetic Diet   BPH with obstruction/lower urinary tract symptoms Per my review of the chart patient has experienced urinary retention secondary to BPH in 2021 prompting temporary placement of a Foley catheter Patient's Flomax was held for several days after surgery and was resumed on 10/21 Patient has a history of BPH with urinary retention in the past That being said, dural venous sinus thrombosis is also been known to cause urinary retention Foley catheter removed on 11/11/2021, patient noted to have failed voiding trial and as such we will place Foley catheter back in.  Discontinue bethanechol due to decreased threshold for seizure.  We will likely need outpatient follow-up with urology and will likely need to be discharged with Foley catheter.    Acute anemia Hemoglobin now stable Known history of essential thrombocytopenia on Hydrea No clinical evidence of bleeding Total of 4 units of packed red blood cells transfused during this hospitalization on POD 0 and 1 MCV suggests a macrocytic anemia however vitamin B12 and folate from 08/2021 are unremarkable. -Hemoglobin currently stable at 10.3.  Monitoring hemoglobin and hematocrit with serial CBCs Hematology additionally following, their input is appreciated  Chronic atrial fibrillation with RVR (HCC) Improved rate control Continue home regimen of metoprolol as blood pressure tolerates Monitoring patient on telemetry Now on heparin therapy for dural venous sinus thrombosis  Leukocytosis Worsening leukocytosis, impressively high Cultures negative thus far Infectious disease consultation obtained 10/23 They believed at the time the patient is not suffering from an infectious process and that no further work-up or empiric antibiotics are necessary. Patient received a 3-day course of intravenous Zosyn from 10/19 until 10/21 which was  discontinued due to no evidence of infection Peripheral smear on 10/22 unremarkable Patient placed back on hydroxyurea 10/24 by Dr. Marin Olp  S/P total left hip arthroplasty Status post left hip arthroplasty 10/17 with Dr. Alvan Dame Per orthopedic surgery, patient to be weightbearing as tolerated at this point PT, OT evaluations Limited use of opiate-based analgesics due to ongoing delirium   Coronary artery disease involving native heart without angina pectoris Continuing metoprolol, statin therapy Patient is currently chest pain-free Monitoring patient on telemetry  Protein-calorie malnutrition, severe ([place]) Poor oral intake with evidence of fat depletion and weight loss consistent with severe protein calorie malnutrition Poor nutritional status is contributing to profound anasarca/third spacing Nutrition following, their input is appreciated Nutritional supplements initiated Multivitamin Patient scheduled IV albumin every 6 hours for the next 24 to 48 hours.  Mixed diabetic hyperlipidemia associated with type 2 diabetes mellitus ([place]) Continuing home regimen of lipid lowering therapy.   Essential thrombocythemia ([place]) No significant thrombocytosis at this time Dr. Marin Olp with hematology oncology following, his input is appreciated. Patient initiated on hydroxyurea 10/24.  Diuretic-induced hypokalemia Markedly improved Potassium at 3.5. Give a dose of oral potassium supplementation as patient will receive IV Lasix.  Hypomagnesemia Magnesium at 1.6. Magnesium sulfate 4 g IV x1. Repeat labs in the AM.   Acute urinary retention - Patient with history of BPH, noted to have acute urinary retention early on in the hospitalization Foley catheter placed. -Voiding trial done yesterday which patient failed. -Place back Foley catheter. -Continue Flomax, discontinue bethanechol. -We will likely need to be discharged with Foley catheter with outpatient follow-up with  urology.         DVT prophylaxis: SCDs Code Status: DNR Family Communication: Updated patient.  No family at bedside. Disposition: Remain in stepdown unit.  Status is: Inpatient Remains inpatient appropriate because: Severity of illness  Consultants:  Neurology: Dr. Leonel Ramsay 11/08/2021 Hematology/oncology Dr. Marin Olp 11/05/2021 Gastroenterology: Dr. Bryan Lemma 11/08/2021 Hospitalist Dr. Grandville Silos 11/04/2021 Infectious disease: Dr.Vu 11/10/2021  Procedures:  EEG pending 11/12/2021 CT head 11/08/2021 CT abdomen and pelvis 11/11/2021 Chest x-ray 11/05/2021, 11/08/2021, 2023 2023, 11/12/2021 MRI brain 11/08/2021 Renal ultrasound 11/06/2021 Right upper quadrant ultrasound 11/07/2021 2D echo 11/07/2021 Limited echocardiogram 11/09/2021 CT venogram 11/10/2021 4 units PRBCs transfused  Antimicrobials:  Anti-infectives (From admission, onward)    Start     Dose/Rate Route Frequency Ordered Stop   11/06/21 1400  piperacillin-tazobactam (ZOSYN) IVPB 3.375 g  Status:  Discontinued        3.375 g 12.5 mL/hr over 240 Minutes Intravenous Every 8 hours 11/06/21 0722 11/09/21 0842   11/06/21 0815  piperacillin-tazobactam (ZOSYN) IVPB 3.375 g        3.375 g 100 mL/hr over 30 Minutes Intravenous  Once 11/06/21 0720 11/06/21 1548   11/04/21 1800  ceFAZolin (ANCEF) IVPB 2g/100 mL premix        2 g 200 mL/hr over 30 Minutes Intravenous Every 6 hours 11/04/21 1707 11/05/21 0048   11/04/21 0915  ceFAZolin (ANCEF) IVPB 2g/100 mL premix        2 g 200 mL/hr over 30 Minutes Intravenous On call to O.R. 11/04/21 0904 11/04/21 1239         Subjective: Laying in bed, Foley catheter being placed per RN.  Patient noted to have urinary retention and failed voiding trial.  Denies any chest pain.  Denies any significant shortness of breath.  No nausea or vomiting.  Alert and oriented to self place and time.  Not sure who the president is.   Objective: Vitals:   11/12/21 0708 11/12/21  0809 11/12/21 0842 11/12/21 1300  BP: 134/82  118/73   Pulse:      Resp: 19  (!) 26   Temp: 97.7 F (36.5 C)   (!) 97.4 F (36.3 C)  TempSrc: Oral   Oral  SpO2:  99%    Weight:      Height:        Intake/Output Summary (Last 24 hours) at 11/12/2021 1654 Last data filed at 11/12/2021 1200 Gross per 24 hour  Intake 364.12 ml  Output 750 ml  Net -385.88 ml   Filed Weights   11/04/21 0941 11/10/21 1537  Weight: 75.8 kg 68.1 kg    Examination:  General exam: Appears calm and comfortable  Respiratory system: Decreased breath sounds in the bases bilaterally.  Some bibasilar crackles.  No wheezing.  Fair air movement.  Speaking in full sentences.  On 3 L nasal cannula.  Clear to auscultation. Respiratory effort normal. Cardiovascular system: Irregularly irregular. No JVD, murmurs, rubs, gallops or clicks.  Trace to 1+ bilateral lower extremity edema.  No pedal edema. Gastrointestinal system: Abdomen is nondistended, soft and nontender. No organomegaly or masses felt. Normal bowel sounds heard. Central nervous system: Alert and oriented. No focal neurological deficits. Extremities: Symmetric 5 x 5 power. Skin: No rashes, lesions or ulcers Psychiatry: Judgement and insight appear fair. Mood & affect appropriate.     Data Reviewed:   CBC: Recent Labs  Lab 11/07/21 0329 11/08/21 0401 11/09/21 0347 11/10/21 0346 11/11/21 0305 11/12/21 0257  WBC 56.3* 45.1* 50.0* 67.4* 87.7* 69.8*  NEUTROABS 45.2* 37.9* 35.9* 55.9* 71.9*  --   HGB 8.7* 9.9* 9.5* 10.3* 10.9* 10.3*  HCT 26.7* 30.8* 29.8* 32.2* 34.8* 32.9*  MCV 105.1* 105.1* 105.7* 105.2* 109.4* 110.8*  PLT 261 284 274 306 312 261  Basic Metabolic Panel: Recent Labs  Lab 11/08/21 0401 11/09/21 0347 11/09/21 1555 11/10/21 0346 11/10/21 1720 11/11/21 0305 11/12/21 0257  NA 136 137 139 140 140 142 136  K 3.6 2.8* 2.5* 2.6* 3.0* 3.9 3.5  CL 108 104 102 103 103 105 102  CO2 21* '23 28 25 26 25 25  '$ GLUCOSE 87 94 90 80  91 103* 115*  BUN 27* 25* '23 20 16 20 21  '$ CREATININE 2.04* 1.87* 1.76* 1.53* 1.33* 1.33* 1.10  CALCIUM 7.8* 7.6* 7.9* 8.1* 8.1* 8.2* 7.7*  MG 1.9 1.7  --  1.8  --  1.8 1.6*  PHOS  --   --   --  2.6  --   --   --     GFR: Estimated Creatinine Clearance: 58.8 mL/min (by C-G formula based on SCr of 1.1 mg/dL).  Liver Function Tests: Recent Labs  Lab 11/08/21 0401 11/09/21 0347 11/10/21 0346 11/11/21 0305 11/12/21 0257  AST 62* 49* 46* 39 28  ALT '15 13 13 12 11  '$ ALKPHOS 91 94 119 136* 118  BILITOT 1.8* 1.9* 2.3* 2.3* 2.1*  PROT 5.1* 5.1* 5.5* 6.0* 5.2*  ALBUMIN 2.5* 2.4* 2.6* 2.7* 2.4*    CBG: Recent Labs  Lab 11/08/21 0745 11/08/21 1240 11/08/21 1715  GLUCAP 75 106* 103*     Recent Results (from the past 240 hour(s))  Culture, blood (Routine X 2) w Reflex to ID Panel     Status: None   Collection Time: 11/06/21 11:36 AM   Specimen: BLOOD LEFT ARM  Result Value Ref Range Status   Specimen Description   Final    BLOOD LEFT ARM Performed at [facility], [place] [address], [place], [place] [address]    Special Requests   Final    BOTTLES DRAWN AEROBIC AND ANAEROBIC Blood Culture adequate volume Performed at [facility] [address] [place], [place] [address]    Culture   Final    NO GROWTH 5 DAYS Performed at [facility], [place] [address], [place], [place] [address]    Report Status 11/11/2021 FINAL  Final  Culture, blood (Routine X 2) w Reflex to ID Panel     Status: None   Collection Time: 11/06/21 11:47 AM   Specimen: BLOOD RIGHT HAND  Result Value Ref Range Status   Specimen Description   Final    BLOOD RIGHT HAND Performed at [facility] [address], [place], [place] [address]    Special Requests   Final    BOTTLES DRAWN AEROBIC AND ANAEROBIC Blood Culture adequate volume Performed at [facility] [address] [place], [place] [address]    Culture   Final     NO GROWTH 5 DAYS Performed at [facility], [place] [address], [place], [place] [address]    Report Status 11/11/2021 FINAL  Final  Urine Culture     Status: None   Collection Time: 11/06/21  3:11 PM   Specimen: In/Out Cath Urine  Result Value Ref Range Status   Specimen Description   Final    IN/OUT CATH URINE Performed at [facility] [address] [place], [place] [address]    Special Requests   Final    Immunocompromised Performed at [facility], [place] [address] [place],  [address]    Culture   Final    NO GROWTH Performed at [facility], [place]  [address], [place], [place] [address]    Report Status 11/07/2021 FINAL  Final  MRSA Next Gen by PCR, Nasal     Status: None   Collection Time: 11/10/21  3:48 PM   Specimen: Nasal Mucosa; Nasal Swab  Result Value Ref Range Status   MRSA by PCR Next Gen NOT DETECTED NOT DETECTED Final    Comment: (NOTE) The GeneXpert MRSA Assay (FDA approved for NASAL specimens only), is one component of a comprehensive MRSA colonization surveillance program. It is not intended to diagnose MRSA infection nor to guide or monitor treatment for MRSA infections. Test performance is not FDA approved in patients less than 50 years old. Performed at [facility], [place] [address], [place], [place] [address]          Radiology Studies: DG CHEST PORT 1 VIEW  Result Date: 11/12/2021 CLINICAL DATA:  Pleural effusion, atrial fibrillation. EXAM: PORTABLE CHEST 1 VIEW COMPARISON:  11/10/2021 FINDINGS: Stable cardiac enlargement and evidence of prior CABG and clipping of the left atrial appendage. Worsening congestive heart failure since the prior study. Probable component of bilateral pleural effusions, left greater than right. No pneumothorax. IMPRESSION: Worsening congestive heart failure. Probable component of bilateral pleural effusions, left greater than right. Electronically  Signed   By: Aletta Edouard M.D.   On: 11/12/2021 08:51   CT ABDOMEN PELVIS W CONTRAST  Result Date: 11/11/2021 CLINICAL DATA:  Anemia.  Question retroperitoneal bleed. EXAM: CT ABDOMEN AND PELVIS WITH CONTRAST TECHNIQUE: Multidetector CT imaging of the abdomen and pelvis was performed using the standard protocol following bolus administration of intravenous contrast. RADIATION DOSE REDUCTION: This exam was performed according to the departmental dose-optimization program which includes automated exposure control, adjustment of the mA and/or kV according to patient size and/or use of iterative reconstruction technique. CONTRAST:  176m OMNIPAQUE IOHEXOL 300 MG/ML  SOLN COMPARISON:  CT of the abdomen and pelvis 10/04/2019 FINDINGS: Lower chest: Large bilateral pleural effusions are present. Partial collapse of the left lower lobe noted. Dependent atelectasis is present on the right. The heart is enlarged. Coronary artery calcifications are present. No significant pericardial effusion is present. Hepatobiliary: Small amount of fluid is present about the liver. There is some irregularity to the ventral surface of the liver. No discrete lesions are present. Cholesterol gallstones are present within the gallbladder. Layering contrast noted. No inflammatory changes are present. Pancreas: Unremarkable. No pancreatic ductal dilatation or surrounding inflammatory changes. Spleen: Spleen is enlarged, measuring over 15 cm cephalo caudad dimension. Adrenals/Urinary Tract: Right adrenal adenoma is stable measuring 1.2 cm maximally. Left adrenal gland is normal. Scarring is present both kidneys. Vascular changes are present without definite stone. No renal mass is present. Ureters are within normal limits bilaterally. Diffuse wall thickening is present throughout the urinary bladder. Gas and contrast are present within the bladder. Stomach/Bowel: The stomach and duodenum are within normal limits. Small bowel is  unremarkable. Terminal ileum is normal. Ascending transverse colon are within normal limits. Descending and sigmoid colon are normal. Vascular/Lymphatic: Extensive atherosclerotic calcifications are present the aorta branch vessels. No aneurysm is present. No significant adenopathy is present. Reproductive: Prostate is unremarkable. Other: Moderate free fluid is present within the dependent anatomic pelvis. No retroperitoneal hemorrhage is present. Extensive subcutaneous edema is present bilaterally. Musculoskeletal: Multilevel degenerative changes are present lumbar spine. Hemangiomas are present at L2 and L3. Degenerative changes are present at the SI joints. Bilateral hip prostheses noted. IMPRESSION: 1. No retroperitoneal hemorrhage. 2. Large bilateral pleural effusions with partial collapse  of the left lower lobe. 3. Moderate free fluid within the dependent anatomic pelvis. 4. Diffuse wall thickening throughout the urinary bladder compatible with cystitis. Correlate with urinalysis. Gas may represent infection or recent instrumentation. 5. Cholelithiasis without evidence for cholecystitis. 6. Splenomegaly may be related to liver disease. 7. Stable right adrenal adenoma. 8. Extensive subcutaneous edema bilaterally compatible with anasarca. 9.  Aortic Atherosclerosis (ICD10-I70.0). Electronically Signed   By: San Morelle M.D.   On: 11/11/2021 17:45        Scheduled Meds:  Chlorhexidine Gluconate Cloth  6 each Topical Daily   feeding supplement  237 mL Oral BID BM   furosemide  20 mg Intravenous Q12H   hydroxyurea  500 mg Oral q12n4p   loratadine  10 mg Oral Daily   metoprolol tartrate  25 mg Oral BID   multivitamin with minerals  1 tablet Oral Daily   pantoprazole  40 mg Oral BID   polyethylene glycol  17 g Oral Daily   rosuvastatin  20 mg Oral Daily   tamsulosin  0.4 mg Oral Daily   thiamine (VITAMIN B1) injection  100 mg Intravenous Daily   Or   thiamine  100 mg Oral Daily    Continuous Infusions:  sodium chloride Stopped (11/11/21 1130)   albumin human 25 g (11/12/21 1451)   heparin 1,450 Units/hr (11/12/21 0853)   methocarbamol (ROBAXIN) IV       LOS: 7 days    Time spent: 40 minutes    Irine Seal, MD [facility]   To contact the attending provider between 7A-7P or the covering provider during after hours 7P-7A, please log into the web site [email or web address] and access using universal  password for that web site. If you do not have the password, please call the hospital operator.  11/12/2021, 4:54 PM

## 2021-11-12 NOTE — Progress Notes (Signed)
Subjective: Patient is awake and alert and oriented to self, month, year, city and situation. Knows he is in a hospital after surgery. On heparin drip. EEG with no seizures.  Exam: Vitals:   11/12/21 0842 11/12/21 1300  BP: 118/73   Pulse:    Resp: (!) 26   Temp:  (!) 97.4 F (36.3 C)  SpO2:     Gen: In bed, NAD Resp: non-labored breathing, no acute distress Abd: soft, nt  Neuro: MS: Awake, oriented to self, month, year, city and situation. Unable to do complex thinking.  Follows commands  CN: EOMI, no nystagmus Motor: Moves all extremities well, he does have mild asterixis today. Sensory: Intact to light touch  Pertinent Labs:  Sodium 136 Calcium 7.6, albumin 2.4 Creatinine 1.10 with GFR >60 ESR 0 RPR negative TSH  4.9 Folate nml B12 3470  MRI 10/21  Asymmetrically decreased signal within the left sigmoid sinus with an absent flow void on T2 weighted imaging. This may represent slow flow or occlusion. If there is concern for dural venous sinus thrombosis, further evaluation with a CT venogram is recommende  CTV  10/23 Similar nonopacification of the left sigmoid sinus and visualized left internal jugular vein, suspicious for dural venous sinus thrombosis.  EEG: No seizures, moderate encephalopathy, no focal slowing.   Impression: 66 year old male with multifactorial delirium.  Mentation improved. However, found to have left sigmoid and L jugular occlusive dural sinus venous thrombosis. He is coagulopathic but despite that, agree with heparin gtt.  Recommendations: - Heparin for acute treatment of CVST. Consider goal between 0.3-0.5 given his coagulopathy which imparts much higher risk of bleeding in general. Heparin or full dose SQ lovenox is the preferred agent for treatment of CVST in the acute phase but after about 5 days of treatment with heparin, he can be transitioned back to PO eliquis 80m BID. - Follow up with stroke clinic outpatient for further  evaluation and management of CVST. - continue thiamine daily  - continue seroquel  - Delirium precautions. - We will signoff. Please feel free to contact [facility] any questions or concerns.  DBeulah GandyDNP, ACNPC-AG  NEUROHOSPITALIST ADDENDUM Performed a face to face diagnostic evaluation.   I have reviewed the contents of history and physical exam as documented by PA/ARNP/Resident and agree with above documentation.  I have discussed and formulated the above plan as documented. Edits to the note have been made as needed.   SDonnetta Simpers MD [facility] [phone]  If 7pm to 7am, please call on call as listed on AMION.

## 2021-11-12 NOTE — Progress Notes (Signed)
EEG complete - results pending 

## 2021-11-12 NOTE — Procedures (Signed)
Patient Name: Justin [name]  MRN: [id number]  Epilepsy Attending: Lora Havens  Referring Physician/Provider: Derek Jack, MD  Date: 11/12/2021 Duration: 24.57 mins  Patient history: 66 year old male with altered mental status.  EEG to evaluate for seizure.  Level of alertness: Awake  AEDs during EEG study: None  Technical aspects: This EEG study was done with scalp electrodes positioned according to the 10-20 International system of electrode placement. Electrical activity was reviewed with band pass filter of 1-'70Hz'$ , sensitivity of 7 uV/mm, display speed of 2m/sec with a '60Hz'$  notched filter applied as appropriate. EEG data were recorded continuously and digitally stored.  Video monitoring was available and reviewed as appropriate.  Description: No clear posterior dominant rhythm was seen.  EEG showed continuous generalized 5 to 7 Hz theta slowing. Hyperventilation and photic stimulation were not performed.     ABNORMALITY - Continuous slow, generalized  IMPRESSION: This study is suggestive of moderate diffuse encephalopathy, nonspecific etiology. No seizures or epileptiform discharges were seen throughout the recording.  Joanna Hall OBarbra Sarks

## 2021-11-12 NOTE — Progress Notes (Signed)
PT Cancellation Note  Patient Details Name: Arlester Keehan MRN: [id number] DOB: Jun 06, 1955   Cancelled Treatment:    Reason Eval/Treat Not Completed: Patient at procedure or test/unavailable, EEG, will check back. [place] Office [phone] Weekend pager-[phone]    Claretha Cooper 11/12/2021, 2:48 PM

## 2021-11-12 NOTE — Progress Notes (Signed)
SLP Cancellation Note  Patient Details Name: Justin [name] MRN: [id number] DOB: 1955-01-20   Cancelled treatment:       Reason Eval/Treat Not Completed: Patient at procedure or test/unavailable  Sonia Baller, MA, CCC-SLP Speech Therapy

## 2021-11-12 NOTE — Progress Notes (Signed)
ANTICOAGULATION CONSULT NOTE  Pharmacy Consult for heparin Indication:  dural venous sinus thrombosis  Allergies  Allergen Reactions   Codeine Swelling and Other (See Comments)    Tolerates hydrocodone, tramadol, and oxycodone    Dilaudid [Hydromorphone] Anxiety    Hallucinations    Doxycycline Itching and Rash    Patient Measurements: Height: '5\' 6"'$  (167.6 cm) Weight: 68.1 kg (150 lb 2.1 oz) IBW/kg (Calculated) : 63.8 Heparin Dosing Weight: n/a. Use TBW of 68 kg  Vital Signs: Temp: 97.6 F (36.4 C) (10/25 0354) Temp Source: Oral (10/25 0354) BP: 103/61 (10/25 0349) Pulse Rate: 47 (10/25 0349)  Labs: Recent Labs    11/10/21 0346 11/10/21 0346 11/10/21 1720 11/10/21 2111 11/11/21 0305 11/11/21 0306 11/11/21 1100 11/12/21 0257  HGB 10.3*  --   --   --  10.9*  --   --  10.3*  HCT 32.2*  --   --   --  34.8*  --   --  32.9*  PLT 306  --   --   --  312  --   --  261  APTT 39*  --   --  76*  --   --   --   --   LABPROT 21.9*  --   --   --  21.2*  --   --   --   INR 1.9*  --   --   --  1.9*  --   --   --   HEPARINUNFRC  --    < >  --  0.61  --  0.42 0.36 0.24*  CREATININE 1.53*  --  1.33*  --  1.33*  --   --  1.10   < > = values in this interval not displayed.     Estimated Creatinine Clearance: 58.8 mL/min (by C-G formula based on SCr of 1.1 mg/dL).   Medical History: Past Medical History:  Diagnosis Date   Acute meniscal tear of knee LEFT   Anemia    Aortic stenosis 12/01/2017   NONRHEUMATIC, AORTIC VALVE CALCIFICATIONS, MILD TO MODERATE REGURG, MILD TO MODERATE CALCIFIED ANNULUS per ECHO 10/25/17 @ [facility] [address]   Arthritis    Atrial fibrillation ([place]) 11/12/2017   AT O/V WITH PCP   Cancer ([place])    skin right arm   Coronary artery disease    Dyspnea    GERD (gastroesophageal reflux disease)    Headache    hx of migraines   Heart murmur MILD-- ASYMPTOMATIC   History of kidney stones    Hyperlipidemia    Hypertension    Left knee pain    Mixed  diabetic hyperlipidemia associated with type 2 diabetes mellitus ([place]) 11/05/2021   PAD (peripheral artery disease) ([place])    left leg claudication   Sleep apnea    Assessment: 40 yoM admitted for left THA on 10/17. Pt was on apixaban PTA for atrial fibrillation, which was held on 10/13 prior to surgery. Post-op course complicated by severe post-op blood loss, resulting in apixaban being held until 10/19. Pt received four doses of apixaban (10/19, 10/20), but it was held again starting 10/21.   Neurology was consulted for confusion/encephalopathy on 10/21. MRI Brain done 10/21, CT Venogram head done 10/23 suspicious for dural venous sinus thrombosis. Pharmacy consulted to initiate heparin drip.   Significant Events/Discussion: 10/23: HL and aPTT correlating within therapeutic range 10/24: Contacted by neurologist (Dr Lorrin Goodell) to request maintain heparin at low end of goal range (0.3-0.5) due to high  bleeding risk   11/12/2021: HL = 0.24 now subtherapeutic on heparin infusion at 1350 units/hr CBC: Hgb (10.3) low but stable, Plt WNL Confirmed with RN that heparin infusing at correct rate. No signs of bleeding.  Heparin was off for CT yesterday afternoon from 1630-2000, however has been infusing without issues ~7hr prior to lab draw so should be at steady state  Goal of Therapy:  Heparin level 0.3-0.5 per neurology Monitor platelets by anticoagulation protocol: Yes   Plan:  Increase heparin infusion to 1450 units/hr Recheck heparin level in 6h CBC, HL daily Monitor for signs of bleeding Follow for long-term anticoagulation plan  Netta Cedars PharmD 11/12/2021,4:07 AM

## 2021-11-12 NOTE — Progress Notes (Deleted)
Subjective: Patient is awake and alert and oriented to self, month, year, city and situation. Unable to state type of place. On heparin drip. Waiting for EEG to be done today   Exam: Vitals:   11/12/21 0842 11/12/21 1300  BP: 118/73   Pulse:    Resp: (!) 26   Temp:  (!) 97.4 F (36.3 C)  SpO2:     Gen: In bed, NAD Resp: non-labored breathing, no acute distress Abd: soft, nt  Neuro: MS: Awake, oriented to self, month, year, city and situation. Unable to do complex thinking.  Follows commands  CN: EOMI, no nystagmus Motor: Moves all extremities well, he does have mild asterixis today. Sensory: Intact to light touch  Pertinent Labs:  Sodium 136 Calcium 7.6, albumin 2.4 Creatinine 1.10 with GFR >60 ESR 0 RPR negative TSH  4.9 Folate nml B12 3470  MRI 10/21  Asymmetrically decreased signal within the left sigmoid sinus with an absent flow void on T2 weighted imaging. This may represent slow flow or occlusion. If there is concern for dural venous sinus thrombosis, further evaluation with a CT venogram is recommende  CTV  10/23 Similar nonopacification of the left sigmoid sinus and visualized left internal jugular vein, suspicious for dural venous sinus thrombosis.   Impression: 66 year old male with multifactorial delirium.  Mentation improved. However, found to have left sigmoid and L jugular occlusive dural sinus venous thrombosis. He is coagulopathic but despite that, agree with heparin gtt.  Recommendations: - Check EEG  - continue Heparin drip, consider keeping Heparin levels lower between 0.3-0.5 instead of the usual range of 0.3-0.7 given his elevated risk of bleeding in the setting of coagulopathy. - continue thiamine daily  - continue Lactulose  - continue seroquel  - Delirium precautions.  Beulah Gandy DNP, ACNPC-AG

## 2021-11-12 NOTE — Progress Notes (Signed)
Received communication from Dr. Cyd Silence a couple of times during the shift. At the first part of my shift it was communicated to Dr. Cyd Silence that pt had yet to void since foley was d/c'd by previous RN and that bladder scan showed result of 358. About 0100 MD made aware that pt was still due to void and at that time Dr. Cyd Silence requested for pt to be monitored for the remainder of the shift and that at 0700 for bladder scan to be repeated if pt had no urinary output by that time. Will continue to monitor for now.

## 2021-11-12 NOTE — Progress Notes (Signed)
Physical Therapy Treatment Patient Details Name: Justin [name] MRN: [id number] DOB: [birth year] Today's Date: 11/12/2021   History of Present Illness 66 y.o. male s/p Left THA on 11/04/21.  PMHx: A.Fib on Eliquis, CAD s/p CABG, AS s/p AVR with bioprosthetic, s/p CEA, DM2, HTN, essential thrombocytosis/JAK2+ chronic leukocytosis, course complicated by  dural venous sinus thrombosis post op.    PT Comments    The patient   resting in bed, HOB raised 45*,wife at bedside.  Patient on 3 LPM,[place] 100%, HR98,110/55   Placed bed in chair position x ~ 5 mins- BP 118/52.  Assisted with + 2 mod  to move to sitting on edge of bed. Patient sat ~ 5 minutes, BP 115/52- SPO2 95% , 3 LPM.  Assisted back to supine, BP 103/59, then 118/51.  Patient slow to follow directions, not oriented to situation, not to having an EEG just before therapy. Continue PT for  progressive mobility as permissve by MD's.  Recommendations for follow up therapy are one component of a multi-disciplinary discharge planning process, led by the attending physician.  Recommendations may be updated based on patient status, additional functional criteria and insurance authorization.  Follow Up Recommendations  Follow physician's recommendations for discharge plan and follow up therapies (may benefit from [facility] or SNF)     Assistance Recommended at Discharge Frequent or constant Supervision/Assistance  Patient can return home with the following Help with stairs or ramp for entrance;A lot of help with walking and/or transfers;A little help with bathing/dressing/bathroom;Assistance with cooking/housework;Assist for transportation   Equipment Recommendations  Rolling walker (2 wheels)    Recommendations for Other Services       Precautions / Restrictions Precautions Precautions: Fall Precaution Comments: monito BP and O2,     Mobility  Bed Mobility   Bed Mobility: Supine to Sit, Sit to Supine     Supine to sit: Mod assist,  +2 for safety/equipment, +2 for physical assistance, HOB elevated Sit to supine: Mod assist, +2 for physical assistance, +2 for safety/equipment, HOB elevated   General bed mobility comments: placed bed in chair  position and monitored  BP's and sats, Sat x ~ 5 minutes then assisted to  move to bed edge, assisted with legs and scooting around, sat x 5 minutes, feet supported on trash can. BP monitored.    Transfers                        Ambulation/Gait                   Stairs             Wheelchair Mobility    Modified Rankin (Stroke Patients Only)       Balance Overall balance assessment: Needs assistance Sitting-balance support: No upper extremity supported, Feet supported Sitting balance-Leahy Scale: Fair                                      Cognition Arousal/Alertness: Awake/alert Behavior During Therapy: WFL for tasks assessed/performed, Flat affect Overall Cognitive Status: Impaired/Different from baseline Area of Impairment: Orientation, Attention, Memory, Following commands, Awareness                 Orientation Level: Time, Situation Current Attention Level: Focused Memory: Decreased short-term memory Following Commands: Follows one step commands with increased time   Awareness: Emergent   General Comments: patient unable  to recal test hed today(EEG), not  surgeon name, started speaking about his heart surgery. wife present.        Exercises Total Joint Exercises Ankle Circles/Pumps: AROM, Both, Supine, 10 reps Heel Slides: AAROM, Left, 10 reps, Supine Long Arc Quad: AROM, Left, 10 reps, Seated    General Comments        Pertinent Vitals/Pain Pain Assessment Pain Assessment: Faces Faces Pain Scale: Hurts even more Pain Location: left hip with hip flexion Pain Descriptors / Indicators: Aching, Sore Pain Intervention(s): Monitored during session    Home Living                           Prior Function            PT Goals (current goals can now be found in the care plan section) Acute Rehab PT Goals Patient Stated Goal: to get oob PT Goal Formulation: With patient Time For Goal Achievement: 11/26/21 Potential to Achieve Goals: Fair Progress towards PT goals:  (PT goals updated)    Frequency    7X/week      PT Plan Discharge plan needs to be updated    Co-evaluation              AM-PAC PT "6 Clicks" Mobility   Outcome Measure  Help needed turning from your back to your side while in a flat bed without using bedrails?: A Lot Help needed moving from lying on your back to sitting on the side of a flat bed without using bedrails?: A Lot Help needed moving to and from a bed to a chair (including a wheelchair)?: Total Help needed standing up from a chair using your arms (e.g., wheelchair or bedside chair)?: Total Help needed to walk in hospital room?: Total Help needed climbing 3-5 steps with a railing? : Total 6 Click Score: 8    End of Session   Activity Tolerance: Patient tolerated treatment well;Patient limited by fatigue Patient left: in bed;with call bell/phone within reach Nurse Communication: Mobility status PT Visit Diagnosis: Other abnormalities of gait and mobility (R26.89)     Time: 1544-1610 PT Time Calculation (min) (ACUTE ONLY): 26 min  Charges:  $Therapeutic Exercise: 8-22 mins $Therapeutic Activity: 8-22 mins                     Tresa Endo [place] Office [phone] Weekend pager-[phone]    Claretha Cooper 11/12/2021, 4:42 PM

## 2021-11-12 NOTE — Progress Notes (Signed)
ANTICOAGULATION CONSULT NOTE  Pharmacy Consult for heparin Indication:  dural venous sinus thrombosis  Allergies  Allergen Reactions   Codeine Swelling and Other (See Comments)    Tolerates hydrocodone, tramadol, and oxycodone    Dilaudid [Hydromorphone] Anxiety    Hallucinations    Doxycycline Itching and Rash    Patient Measurements: Height: '5\' 6"'$  (167.6 cm) Weight: 68.1 kg (150 lb 2.1 oz) IBW/kg (Calculated) : 63.8 Heparin Dosing Weight: n/a. Use TBW of 68 kg  Vital Signs: Temp: 97.4 F (36.3 C) (10/25 1300) Temp Source: Oral (10/25 1300) BP: 103/59 (10/25 1600) Pulse Rate: 95 (10/25 1600)  Labs: Recent Labs    11/10/21 0346 11/10/21 1720 11/10/21 2111 11/11/21 0305 11/11/21 0306 11/12/21 0257 11/12/21 1041 11/12/21 1657  HGB 10.3*  --   --  10.9*  --  10.3*  --   --   HCT 32.2*  --   --  34.8*  --  32.9*  --   --   PLT 306  --   --  312  --  261  --   --   APTT 39*  --  76*  --   --   --   --   --   LABPROT 21.9*  --   --  21.2*  --   --   --   --   INR 1.9*  --   --  1.9*  --   --   --   --   HEPARINUNFRC  --   --  0.61  --    < > 0.24* 0.32 0.22*  CREATININE 1.53* 1.33*  --  1.33*  --  1.10  --   --    < > = values in this interval not displayed.     Estimated Creatinine Clearance: 58.8 mL/min (by C-G formula based on SCr of 1.1 mg/dL).  Assessment: 86 yoM admitted for left THA on 10/17. Pt was on apixaban PTA for atrial fibrillation, which was held on 10/13 prior to surgery. Post-op course complicated by severe post-op blood loss, resulting in apixaban being held until 10/19. Pt received four doses of apixaban (10/19, 10/20), but it was held again starting 10/21.   Neurology was consulted for confusion/encephalopathy on 10/21. MRI Brain done 10/21, CT Venogram head done 10/23 suspicious for dural venous sinus thrombosis. Pharmacy consulted to initiate heparin drip.   Significant Events/Discussion: 10/23: HL and aPTT correlating within therapeutic  range 10/24: Contacted by neurologist (Dr Lorrin Goodell) to request maintain heparin at low end of goal range (0.3-0.5) due to high bleeding risk 10/24: Heparin off from 1630-2000 for CT   11/12/2021: HL now subtherapeutic on 1450 units/hr, despite recent rate increase and therapeutic level CBC: Hgb (10.3) low but stable, Plt WNL Confirmed with RN that heparin infusing at correct rate. No signs of bleeding - bruising is stable/not worsening. No line issues.   Goal of Therapy:  Heparin level 0.3-0.5 per neurology Monitor platelets by anticoagulation protocol: Yes   Plan:  Increase heparin to 1600 units/hr; no bolus Check 6 hour HL CBC, HL daily Monitor for signs of bleeding Follow for long-term anticoagulation plan  Korrine Sicard A PharmD 11/12/2021,6:31 PM

## 2021-11-12 NOTE — Progress Notes (Signed)
Subjective: 8 Days Post-Op Procedure(s) (LRB): TOTAL HIP ARTHROPLASTY ANTERIOR APPROACH (Left) Patient reports pain as mild.   Patient seen in rounds with Dr. Alvan Dame. Patient is resting in bed on exam this morning. He awakens to [place] knocking on the door and is able to tell us his name, that he is in [place], and that he is here for a left hip replacement. He also jokes with [place] a bit. He does seem more alert this morning. He has not been up with PT per Neuro recs.  We will continue therapy today.   Objective: Vital signs in last 24 hours: Temp:  [97.2 F (36.2 C)-98 F (36.7 C)] 97.7 F (36.5 C) (10/25 0708) Pulse Rate:  [33-121] 47 (10/25 0349) Resp:  [14-30] 19 (10/25 0708) BP: (91-144)/(56-82) 134/82 (10/25 0708) SpO2:  [68 %-99 %] 96 % (10/25 0349)  Intake/Output from previous day:  Intake/Output Summary (Last 24 hours) at 11/12/2021 0750 Last data filed at 11/12/2021 0459 Gross per 24 hour  Intake 914.85 ml  Output 375 ml  Net 539.85 ml     Intake/Output this shift: No intake/output data recorded.  Labs: Recent Labs    11/10/21 0346 11/11/21 0305 11/12/21 0257  HGB 10.3* 10.9* 10.3*   Recent Labs    11/11/21 0305 11/12/21 0257  WBC 87.7* 69.8*  RBC 3.18* 2.97*  HCT 34.8* 32.9*  PLT 312 261   Recent Labs    11/11/21 0305 11/12/21 0257  NA 142 136  K 3.9 3.5  CL 105 102  CO2 25 25  BUN 20 21  CREATININE 1.33* 1.10  GLUCOSE 103* 115*  CALCIUM 8.2* 7.7*   Recent Labs    11/10/21 0346 11/11/21 0305  INR 1.9* 1.9*    Exam: General - Patient is Alert and Appropriate Extremity - Neurologically intact Sensation intact distally Intact pulses distally Dorsiflexion/Plantar flexion intact Dressing - dressing C/D/I Motor Function - intact, moving foot and toes well on exam.   Past Medical History:  Diagnosis Date   Acute meniscal tear of knee LEFT   Anemia    Aortic stenosis 12/01/2017   NONRHEUMATIC, AORTIC VALVE CALCIFICATIONS, MILD TO  MODERATE REGURG, MILD TO MODERATE CALCIFIED ANNULUS per ECHO 10/25/17 @ [facility] [place]   Arthritis    Atrial fibrillation ([place]) 11/12/2017   AT O/V WITH PCP   Cancer ([place])    skin right arm   Coronary artery disease    Dyspnea    GERD (gastroesophageal reflux disease)    Headache    hx of migraines   Heart murmur MILD-- ASYMPTOMATIC   History of kidney stones    Hyperlipidemia    Hypertension    Left knee pain    Mixed diabetic hyperlipidemia associated with type 2 diabetes mellitus ([place]) 11/05/2021   PAD (peripheral artery disease) ([facility])    left leg claudication   Sleep apnea     Assessment/Plan: 8 Days Post-Op Procedure(s) (LRB): TOTAL HIP ARTHROPLASTY ANTERIOR APPROACH (Left) Principal Problem:   Dural venous sinus thrombosis Active Problems:   Leukocytosis   Chronic atrial fibrillation with RVR ([facility])   Essential thrombocythemia ([place])   Coronary artery disease involving native heart without angina pectoris   Acute anemia   AKI (acute kidney injury) ([place])   S/P total left hip arthroplasty   Mixed diabetic hyperlipidemia associated with type 2 diabetes mellitus ([facility])   Hypomagnesemia   Acute metabolic encephalopathy   Acute on chronic systolic CHF (congestive heart failure) ([facility])   Protein-calorie malnutrition,  severe ([facility])   Elevated INR   BPH with obstruction/lower urinary tract symptoms   Diuretic-induced hypokalemia   Malnutrition of moderate degree  Estimated body mass index is 24.23 kg/m as calculated from the following:   Height as of this encounter: '5\' 6"'$  (1.676 m).   Weight as of this encounter: 68.1 kg. Advance diet Up with therapy  DVT Prophylaxis - on heparin currently Weight bearing as tolerated.  Hgb stable at 10.3 this AM.  Discussed with PT that we would really like patient to get up out of bed today if possible. She will discuss with Neuro to see if he is cleared for this activity.  Griffith Citron, PA-C Orthopedic Surgery [phone] 11/12/2021, 7:50 AM

## 2021-11-12 NOTE — Assessment & Plan Note (Addendum)
-   Patient with history of BPH, noted to have acute urinary retention early on in the hospitalization Foley catheter placed. -Voiding trial done which patient failed. -Foley catheter placed back in.   -Continue Flomax, discontinued bethanechol. -Curb sided urology, Dr. Louis Meckel who recommended repeat voiding trial in the morning and if patient fails Foley catheter to be placed back in with outpatient follow-up with urology.   Outpatient appointment to be arranged for patient on follow-up with urology.

## 2021-11-12 NOTE — Progress Notes (Signed)
ANTICOAGULATION CONSULT NOTE  Pharmacy Consult for heparin Indication:  dural venous sinus thrombosis  Allergies  Allergen Reactions   Codeine Swelling and Other (See Comments)    Tolerates hydrocodone, tramadol, and oxycodone    Dilaudid [Hydromorphone] Anxiety    Hallucinations    Doxycycline Itching and Rash    Patient Measurements: Height: '5\' 6"'$  (167.6 cm) Weight: 68.1 kg (150 lb 2.1 oz) IBW/kg (Calculated) : 63.8 Heparin Dosing Weight: n/a. Use TBW of 68 kg  Vital Signs: Temp: 97.7 F (36.5 C) (10/25 0708) Temp Source: Oral (10/25 0708) BP: 103/61 (10/25 0349) Pulse Rate: 47 (10/25 0349)  Labs: Recent Labs    11/10/21 0346 11/10/21 0346 11/10/21 1720 11/10/21 2111 11/11/21 0305 11/11/21 0306 11/11/21 1100 11/12/21 0257  HGB 10.3*  --   --   --  10.9*  --   --  10.3*  HCT 32.2*  --   --   --  34.8*  --   --  32.9*  PLT 306  --   --   --  312  --   --  261  APTT 39*  --   --  76*  --   --   --   --   LABPROT 21.9*  --   --   --  21.2*  --   --   --   INR 1.9*  --   --   --  1.9*  --   --   --   HEPARINUNFRC  --    < >  --  0.61  --  0.42 0.36 0.24*  CREATININE 1.53*  --  1.33*  --  1.33*  --   --  1.10   < > = values in this interval not displayed.     Estimated Creatinine Clearance: 58.8 mL/min (by C-G formula based on SCr of 1.1 mg/dL).   Medical History: Past Medical History:  Diagnosis Date   Acute meniscal tear of knee LEFT   Anemia    Aortic stenosis 12/01/2017   NONRHEUMATIC, AORTIC VALVE CALCIFICATIONS, MILD TO MODERATE REGURG, MILD TO MODERATE CALCIFIED ANNULUS per ECHO 10/25/17 @ [facility] [address]   Arthritis    Atrial fibrillation ([place]) 11/12/2017   AT O/V WITH PCP   Cancer ([place])    skin right arm   Coronary artery disease    Dyspnea    GERD (gastroesophageal reflux disease)    Headache    hx of migraines   Heart murmur MILD-- ASYMPTOMATIC   History of kidney stones    Hyperlipidemia    Hypertension    Left knee pain    Mixed  diabetic hyperlipidemia associated with type 2 diabetes mellitus ([place]) 11/05/2021   PAD (peripheral artery disease) ([place])    left leg claudication   Sleep apnea    Assessment: 26 yoM admitted for left THA on 10/17. Pt was on apixaban PTA for atrial fibrillation, which was held on 10/13 prior to surgery. Post-op course complicated by severe post-op blood loss, resulting in apixaban being held until 10/19. Pt received four doses of apixaban (10/19, 10/20), but it was held again starting 10/21.   Neurology was consulted for confusion/encephalopathy on 10/21. MRI Brain done 10/21, CT Venogram head done 10/23 suspicious for dural venous sinus thrombosis. Pharmacy consulted to initiate heparin drip.   Significant Events/Discussion: 10/23: HL and aPTT correlating within therapeutic range 10/24: Contacted by neurologist (Dr Lorrin Goodell) to request maintain heparin at low end of goal range (0.3-0.5) due to high  bleeding risk 10/24: Heparin off from 1630-2000 for CT   11/12/2021: HL = 0.32 is therapeutic on heparin infusion of 1450 units/hr CBC: Hgb (10.3) low but stable, Plt WNL Confirmed with RN that heparin infusing at correct rate. No signs of bleeding - bruising is stable/not worsening. No line issues.   Goal of Therapy:  Heparin level 0.3-0.5 per neurology Monitor platelets by anticoagulation protocol: Yes   Plan:  Continue heparin at 1450 units/hr Check confirmatory 6 hour HL CBC, HL daily Monitor for signs of bleeding Follow for long-term anticoagulation plan  Lenis Noon PharmD 11/12/2021,7:23 AM

## 2021-11-13 DIAGNOSIS — I2510 Atherosclerotic heart disease of native coronary artery without angina pectoris: Secondary | ICD-10-CM

## 2021-11-13 DIAGNOSIS — G9341 Metabolic encephalopathy: Secondary | ICD-10-CM | POA: Diagnosis not present

## 2021-11-13 DIAGNOSIS — R791 Abnormal coagulation profile: Secondary | ICD-10-CM

## 2021-11-13 DIAGNOSIS — D473 Essential (hemorrhagic) thrombocythemia: Secondary | ICD-10-CM

## 2021-11-13 DIAGNOSIS — T502X5A Adverse effect of carbonic-anhydrase inhibitors, benzothiadiazides and other diuretics, initial encounter: Secondary | ICD-10-CM

## 2021-11-13 DIAGNOSIS — N138 Other obstructive and reflux uropathy: Secondary | ICD-10-CM

## 2021-11-13 DIAGNOSIS — I5023 Acute on chronic systolic (congestive) heart failure: Secondary | ICD-10-CM

## 2021-11-13 DIAGNOSIS — R338 Other retention of urine: Secondary | ICD-10-CM

## 2021-11-13 DIAGNOSIS — G08 Intracranial and intraspinal phlebitis and thrombophlebitis: Secondary | ICD-10-CM | POA: Diagnosis not present

## 2021-11-13 DIAGNOSIS — I482 Chronic atrial fibrillation, unspecified: Secondary | ICD-10-CM

## 2021-11-13 DIAGNOSIS — E876 Hypokalemia: Secondary | ICD-10-CM

## 2021-11-13 DIAGNOSIS — E43 Unspecified severe protein-calorie malnutrition: Secondary | ICD-10-CM

## 2021-11-13 DIAGNOSIS — N401 Enlarged prostate with lower urinary tract symptoms: Secondary | ICD-10-CM

## 2021-11-13 DIAGNOSIS — D62 Acute posthemorrhagic anemia: Secondary | ICD-10-CM

## 2021-11-13 DIAGNOSIS — D72829 Elevated white blood cell count, unspecified: Secondary | ICD-10-CM

## 2021-11-13 DIAGNOSIS — D649 Anemia, unspecified: Secondary | ICD-10-CM

## 2021-11-13 DIAGNOSIS — E782 Mixed hyperlipidemia: Secondary | ICD-10-CM

## 2021-11-13 DIAGNOSIS — N179 Acute kidney failure, unspecified: Secondary | ICD-10-CM

## 2021-11-13 LAB — CBC
HCT: 33.3 % — ABNORMAL LOW (ref 39.0–52.0)
Hemoglobin: 10.6 g/dL — ABNORMAL LOW (ref 13.0–17.0)
MCH: 35.1 pg — ABNORMAL HIGH (ref 26.0–34.0)
MCHC: 31.8 g/dL (ref 30.0–36.0)
MCV: 110.3 fL — ABNORMAL HIGH (ref 80.0–100.0)
Platelets: 237 10*3/uL (ref 150–400)
RBC: 3.02 MIL/uL — ABNORMAL LOW (ref 4.22–5.81)
RDW: 26.5 % — ABNORMAL HIGH (ref 11.5–15.5)
WBC: 70.7 10*3/uL (ref 4.0–10.5)
nRBC: 1 % — ABNORMAL HIGH (ref 0.0–0.2)

## 2021-11-13 LAB — HEPARIN LEVEL (UNFRACTIONATED)
Heparin Unfractionated: >1.1 IU/mL — ABNORMAL HIGH (ref 0.30–0.70)
Heparin Unfractionated: 0.27 IU/mL — ABNORMAL LOW (ref 0.30–0.70)
Heparin Unfractionated: 0.27 IU/mL — ABNORMAL LOW (ref 0.30–0.70)

## 2021-11-13 LAB — BASIC METABOLIC PANEL
Anion gap: 9 (ref 5–15)
BUN: 24 mg/dL — ABNORMAL HIGH (ref 8–23)
CO2: 24 mmol/L (ref 22–32)
Calcium: 8 mg/dL — ABNORMAL LOW (ref 8.9–10.3)
Chloride: 102 mmol/L (ref 98–111)
Creatinine, Ser: 1.15 mg/dL (ref 0.61–1.24)
GFR, Estimated: >60 mL/min (ref >60)
Glucose, Bld: 135 mg/dL — ABNORMAL HIGH (ref 70–99)
Potassium: 3.8 mmol/L (ref 3.5–5.1)
Sodium: 135 mmol/L (ref 135–145)

## 2021-11-13 LAB — BETA-2-GLYCOPROTEIN I ABS, IGG/M/A
Beta-2 Glyco I IgG: <9 GPI IgG units (ref 0–20)
Beta-2-Glycoprotein I IgA: <9 GPI IgA units (ref 0–25)
Beta-2-Glycoprotein I IgM: <9 GPI IgM units (ref 0–32)

## 2021-11-13 LAB — MAGNESIUM: Magnesium: 2.3 mg/dL (ref 1.7–2.4)

## 2021-11-13 MED ORDER — HEPARIN (PORCINE) 25000 UT/250ML-% IV SOLN
1450.0000 [IU]/h | INTRAVENOUS | Status: DC
  Administered 2021-11-13: 1450 [IU]/h via INTRAVENOUS
  Filled 2021-11-13: qty 250

## 2021-11-13 MED ORDER — HEPARIN (PORCINE) 25000 UT/250ML-% IV SOLN
1750.0000 [IU]/h | INTRAVENOUS | Status: AC
  Administered 2021-11-13: 1550 [IU]/h via INTRAVENOUS
  Administered 2021-11-14: 1750 [IU]/h via INTRAVENOUS
  Filled 2021-11-13 (×2): qty 250

## 2021-11-13 NOTE — Progress Notes (Signed)
Physical Therapy Treatment Patient Details Name: Justin [name] MRN: [id number] DOB: May 31, 1955 Today's Date: 11/13/2021   History of Present Illness 66 y.o. male s/p Left THA on 11/04/21.  PMHx: A.Fib on Eliquis, CAD s/p CABG, AS s/p AVR with bioprosthetic, s/p CEA, DM2, HTN, essential thrombocytosis/JAK2+ chronic leukocytosis, course complicated by  dural venous sinus thrombosis post op.    PT Comments    The patient is awake, brighter affect today. BP 88/56_(semi seated in bed), post transfer 105/83.  SPO2 on RA for transfer 87%, RR 34, Spo2 on 3 LPM 95%, HR remained 88-90.  Patient  required + 2 mod assistance to move to sitting and to stand and take pivot steps to recliner.  Continue PT for progressive mobility and ambulation.    Recommendations for follow up therapy are one component of a multi-disciplinary discharge planning process, led by the attending physician.  Recommendations may be updated based on patient status, additional functional criteria and insurance authorization.  Follow Up Recommendations  Acute inpatient rehab (3hours/day)     Assistance Recommended at Discharge Frequent or constant Supervision/Assistance  Patient can return home with the following Help with stairs or ramp for entrance;A lot of help with walking and/or transfers;A little help with bathing/dressing/bathroom;Assistance with cooking/housework;Assist for transportation   Equipment Recommendations  Rolling walker (2 wheels)    Recommendations for Other Services Rehab consult OT      Precautions / Restrictions Precautions Precautions: Fall Precaution Comments: monito BP and O2,, on Heparin Restrictions Weight Bearing Restrictions: No     Mobility  Bed Mobility   Bed Mobility: Supine to Sit     Supine to sit: Mod assist, HOB elevated     General bed mobility comments: multimodal cues  to initiate moving  legs, min assist to move LLE to bed edge, Mod assist to sit upright, use o  bed rail    Transfers Overall transfer level: Needs assistance Equipment used: Rolling walker (2 wheels) Transfers: Sit to/from Stand, Bed to chair/wheelchair/BSC Sit to Stand: Mod assist, +2 safety/equipment, +2 physical assistance   Step pivot transfers: Mod assist, +2 safety/equipment, +2 physical assistance       General transfer comment: extra time to  initiate standing, Use of RW to step a few steps to recliner with mod assist of 2.    Ambulation/Gait                   Stairs             Wheelchair Mobility    Modified Rankin (Stroke Patients Only)       Balance Overall balance assessment: Needs assistance Sitting-balance support: Bilateral upper extremity supported, Feet supported Sitting balance-Leahy Scale: Fair Sitting balance - Comments: reports dizziness   Standing balance support: Bilateral upper extremity supported, Reliant on assistive device for balance, During functional activity Standing balance-Leahy Scale: Poor Standing balance comment: reliant on RW and therapist                            Cognition Arousal/Alertness: Awake/alert Behavior During Therapy: WFL for tasks assessed/performed, Flat affect Overall Cognitive Status: Impaired/Different from baseline Area of Impairment: Attention                   Current Attention Level: Sustained Memory: Decreased short-term memory Following Commands: Follows one step commands with increased time   Awareness: Emergent   General Comments: slow to follow,  brighter affect,  Exercises      General Comments        Pertinent Vitals/Pain Pain Assessment Faces Pain Scale: Hurts little more Pain Location: left hip Pain Descriptors / Indicators: Aching, Sore Pain Intervention(s): Monitored during session    Home Living                          Prior Function            PT Goals (current goals can now be found in the care plan section)  Progress towards PT goals: Progressing toward goals    Frequency    7X/week      PT Plan Current plan remains appropriate    Co-evaluation              AM-PAC PT "6 Clicks" Mobility   Outcome Measure  Help needed turning from your back to your side while in a flat bed without using bedrails?: A Lot Help needed moving from lying on your back to sitting on the side of a flat bed without using bedrails?: A Lot Help needed moving to and from a bed to a chair (including a wheelchair)?: A Lot Help needed standing up from a chair using your arms (e.g., wheelchair or bedside chair)?: A Lot Help needed to walk in hospital room?: Total Help needed climbing 3-5 steps with a railing? : Total 6 Click Score: 10    End of Session Equipment Utilized During Treatment: Gait belt Activity Tolerance: Patient limited by fatigue Patient left: in chair;with call bell/phone within reach;with chair alarm set Nurse Communication: Mobility status       Time: 1127-1150 PT Time Calculation (min) (ACUTE ONLY): 23 min  Charges:  $Therapeutic Activity: 23-37 mins                     [place] Office [phone] Weekend [phone]    Claretha Cooper 11/13/2021, 12:12 PM

## 2021-11-13 NOTE — Progress Notes (Signed)
Overall,  I think Mr. Jeffords is a bit better neurologically.  He seems to be more alert today.  For me, he at least no the day.  He thought the month was September.  He did not know who I was.  He is getting physical therapy.  He is being tested by neurology for seizures.  He seems to be eating maybe a little bit more.  He continues on the heparin infusion for the dural sinus thrombus.  He is on Hydrea.  His white cell count is 70,000.  Hemoglobin 10.6.  Platelet count 237,000.  He is not hurting.  His left hip surgical scar looks quite good.  There is no ecchymoses.  The dressing appears dry.  He has had no obvious diarrhea.  He has had no obvious bleeding.  His vital signs are temperature 98.1.  Pulse 90.  Blood pressure 104/65.  Oxygen saturation is 98%.  His lungs sound clear bilaterally.  He has good air movement bilaterally.  Cardiac exam is irregular rate and irregular rhythm.  This is consistent with his atrial fibrillation.  Abdomen is soft.  Bowel sounds are present but slightly decreased.  There is no guarding or rebound tenderness.  I do not palpate his liver or spleen tip.  Extremity shows no clubbing, cyanosis or edema.  Again the surgical scar in the lateral left thigh is healing.  It is dry.  Neurological exam shows no obvious neurological deficit.  There is still some slight cognition issues but, again, this does appear to be better.  Again, this is an incredibly slow recovery from surgery.  Hopefully, his neurological status will continue to improve.  He is on Hydrea for the increased white blood cell count.  I think this is all reactive.  I do not think this is anything related to infection.  I know that the staff in the ICU are doing a fantastic job with him.  I appreciate all of their compassion.  Lattie Haw, MD  1 Cor 15:58

## 2021-11-13 NOTE — Progress Notes (Signed)
ANTICOAGULATION CONSULT NOTE  Pharmacy Consult for heparin Indication:  dural venous sinus thrombosis  Allergies  Allergen Reactions   Codeine Swelling and Other (See Comments)    Tolerates hydrocodone, tramadol, and oxycodone    Dilaudid [Hydromorphone] Anxiety    Hallucinations    Doxycycline Itching and Rash    Patient Measurements: Height: '5\' 6"'$  (167.6 cm) Weight: 68.1 kg (150 lb 2.1 oz) IBW/kg (Calculated) : 63.8 Heparin Dosing Weight: TBW  Vital Signs: Temp: 98.1 F (36.7 C) (10/26 0400) Temp Source: Axillary (10/26 0400) BP: 104/65 (10/26 0600) Pulse Rate: 90 (10/26 0600)  Labs: Recent Labs    11/10/21 1720 11/10/21 2111 11/11/21 0305 11/11/21 0306 11/12/21 0257 11/12/21 1041 11/12/21 1657 11/13/21 0047  HGB   < >  --  10.9*  --  10.3*  --   --  10.6*  HCT  --   --  34.8*  --  32.9*  --   --  33.3*  PLT  --   --  312  --  261  --   --  237  APTT  --  76*  --   --   --   --   --   --   LABPROT  --   --  21.2*  --   --   --   --   --   INR  --   --  1.9*  --   --   --   --   --   HEPARINUNFRC  --  0.61  --    < > 0.24* 0.32 0.22* >1.10*  CREATININE  --   --  1.33*  --  1.10  --   --  1.15   < > = values in this interval not displayed.     Estimated Creatinine Clearance: 56.2 mL/min (by C-G formula based on SCr of 1.15 mg/dL).  Assessment: 37 yoM admitted for left THA on 10/17. Pt was on apixaban PTA for atrial fibrillation, which was held on 10/13 prior to surgery. Post-op course complicated by severe post-op blood loss, resulting in apixaban being held until 10/19. Pt received four doses of apixaban (10/19, 10/20), but it was held again starting 10/21.   Neurology was consulted for confusion/encephalopathy on 10/21. MRI Brain done 10/21, CT Venogram head done 10/23 suspicious for dural venous sinus thrombosis. Pharmacy consulted to initiate heparin drip.   Significant Events/Discussion: 10/23: HL and aPTT correlating within therapeutic range 10/24:  Contacted by neurologist (Dr Lorrin Goodell) to request maintain heparin at low end of goal range (0.3-0.5) due to high bleeding risk 10/24: Heparin off from 1630-2000 for CT, negative for retroperitoneal hemorrhage   11/13/2021: HL 0.27, decreased to subtherapeutic on IV heparin 1450 units/hr CBC: Hgb (10.6) low but stable, Plt WNL Confirmed with RN that heparin infusing at correct rate.  No line issues.  No signs of bleeding - bruising is stable/not worsening.  Goal of Therapy:  Heparin level 0.3-0.5 per neurology Monitor platelets by anticoagulation protocol: Yes   Plan:  Increase to heparin IV infusion at 1550 units/hr Heparin level in 8 hours  Daily heparin level and CBC Monitor for signs of bleeding Follow for long-term anticoagulation plan   Gretta Arab PharmD, BCPS Clinical Pharmacist [facility] main pharmacy [phone] 11/13/2021 8:41 AM

## 2021-11-13 NOTE — Progress Notes (Signed)
PROGRESS NOTE/consult    Justin [name]  FUX:[id number] DOB: [birth year] DOA: 11/04/2021 PCP: [name], MD    No chief complaint on file.   Brief Narrative:  66 y.o. male with history of essential thrombocythemia, paroxysmal atrial fibrillation on Eliquis, coronary artery disease status post CABG history of functional disability to the left hip secondary to end-stage arthritis, failed outpatient nonsurgical conservative treatment greater than 12 weeks ago was admitted to the orthopedic service for left total hip arthroplasty.    Patient noted to have had significant blood loss in the perioperative period and received 2 units packed transfusions perioperatively on 10/17.  Patient initiated on tranexamic acid by orthopedic surgery, admitted to the hospital with hospitalist group consulted for assistance with suspected pulmonary edema in addition to assistance with management of patient's atrial fibrillation, diabetes, coronary artery disease.    Patient continued to exhibit substantial anemia on 10/18 requiring an additional 2 units of packed red blood cells transfused.  Hospitalization 02/54 was complicated by worsening leukocytosis and the development of encephalopathy.  Initial work-up for encephalopathy was completely negative.  Patient was placed on empiric antibiotic therapy 10/19 which was discontinued on 10/22.  After work-up was found to be negative.  Leukocytosis has progressively worsened throughout the hospitalization and risen to remarkable levels.  Infectious disease consultation on 10/23 was obtained and it was felt the patient was not suffering from an underlying infectious process as the cause.  Due to persisting encephalopathy on 10/23 CT venogram was obtained and found to be highly suggestive of a dural venous sinus thrombosis.  Patient was initiated on a heparin infusion and transferred to the stepdown unit for close clinical monitoring and serial neurologic  checks.   Hospital course also complicated by acute kidney injury of likely multifactorial etiology which progressively improved and resolved  Due to flank bruising on physical exam on 10/24 heparin infusion was temporarily held while CT imaging of the abdomen pelvis was performed.  No retroperitoneal hemorrhage was identified and therefore heparin was resumed.      Assessment & Plan:  Principal Problem:   Dural venous sinus thrombosis Active Problems:   Acute metabolic encephalopathy   AKI (acute kidney injury) ([place])   Acute on chronic systolic CHF (congestive heart failure) (HCC)   Elevated INR   BPH with obstruction/lower urinary tract symptoms   Acute anemia   Leukocytosis   Chronic atrial fibrillation with RVR (HCC)   S/P total left hip arthroplasty   Coronary artery disease involving native heart without angina pectoris   Essential thrombocythemia (HCC)   Mixed diabetic hyperlipidemia associated with type 2 diabetes mellitus (HCC)   Protein-calorie malnutrition, severe (HCC)   Diuretic-induced hypokalemia   Hypomagnesemia   Acute urinary retention   Mixed hyperlipidemia   Acute postoperative anemia due to expected blood loss   Malnutrition of moderate degree    Assessment and Plan: * Dural venous sinus thrombosis CT venogram 10/23 confirmed the presence of a dural venous sinus thrombosis Atypical presentation as patient's primary symptoms have been encephalopathy and urinary retention, not headache Since initiation of heparin on 10/23, patient's encephalopathy has improved since only, but is a ways off from complete resolution Monitoring patient in the stepdown unit for any evidence of seizure activity or bleeding complications EEG with no seizures, moderate encephalopathy, no focal slowing noted. Patient was seen by neurology and following who are recommending continue treatment for CVST with heparin with goal between 0.3-0.5 due to coagulopathy, heparin on full dose  subcutaneous  Lovenox preferred agent for treatment of CVST in the acute phase but after 5 days of treatment with heparin could be transition back to Eliquis 5 mg twice daily.  Per Neurology recommendations.   Neurology was following but have signed off.  Continuing serial neurologic checks Dr. Marin Olp has initiated a hypercoagulable work-up and has additionally initiated hydroxyurea Neurology, hematology/oncology following and appreciate input and recommendations.  Acute metabolic encephalopathy Secondary to dural venous sinus thrombosis seen on CTV 10/23 as the most likely cause of the patient's encephalopathy, in combination with hospital delirium that comes from prolonged hospitalization. Ambien, Benadryl and opiates were all discontinued earlier in the hospitalization. B12 and folate levels unremarkable. Ammonia obtained on 10/21 unremarkable despite concerns for possible cirrhosis CT head and MRI brain essentially unremarkable TSH slightly elevated likely secondary to euthyroid sick VBG and ABG reveals no evidence of hypercapnia Patient slowly improving clinically. EEG with no seizures, moderate encephalopathy, no focal slowing. Neurology was following but have signed off.   AKI (acute kidney injury) ([place]) Renal function is improved to baseline. AKI multifactorial Substantial improvement in renal function since placement of a Foley catheter on 10/21 and initiation of Flomax. Patient has a history of BPH with urinary retention in the past per review of old records CT imaging of the abdomen and pelvis revealing circumferential bladder wall thickening concerning for chronic urinary retention That being said, dural venous sinus thrombosis is also been known to cause urinary retention Foley catheter removed however patient failed voiding trial and as such Foley catheter placed back in 11/12/2021.  Urine output of 1.5 L over the past 24 hours, patient also on IV Lasix. -Discontinued  bethanechol.   Monitor urine output. Minimizing use of nephrotoxic agents Monitoring renal function and electrolytes with serial chemistry Strict input and output monitoring  Acute on chronic systolic CHF (congestive heart failure) (HCC) Dry mucous membranes and dark urine on exam with concurrent bibasilar rales and diffuse pitting edema consistent with intravascular depletion with anasarca. Elevated on 10/20 at 1749, now down trended to 1137 on 10/23 Patient was aggressively diuresed with intravenous Lasix from 10/20 until morning of 10/23 Patient continues to exhibit significant bilateral pleural effusions with evidence of anasarca on exam and on CT imaging.   However I feel the majority of this volume overload is now due to third spacing from malnutrition/possible cirrhosis and not acute congestive heart failure  echocardiogram revealing slightly decreased ejection fraction of 45 to 50% revealing some mild systolic dysfunction which was confirmed via focused echo on 10/22. Per Dr Darrick Grinder discussion with the wife on 10/21  patient has been experiencing increasing shortness of breath for at least the past month even prior to his hip surgery. Repeat chest x-ray, still with concerns for pulmonary edema and as such patient placed on Lasix 20 mg IV every 12 hours in addition to IV albumin scheduled every 6 hours x24 hours.   Discontinue IV Lasix after today's doses.  Monitor urine output. I's and O's.  Elevated INR Profoundly elevated INR greater than 10 confirmed with repeat testing on 10/21 Substantial improvement in INR with administration of 5 mg of vitamin K on 10/21 and additional '5mg'$  on 10/22.   No clinical evidence of bleeding Etiology unclear but likely multifactorial Clinical picture not consistent with DIC Right upper quadrant ultrasound suggestive of cirrhosis on 10/20 Evidence of significant splenomegaly on CT imaging of the abdomen and pelvis 10/24 Dr. Bryan Lemma with  gastroenterology was consulted who is not entirely convinced the patient  has true cirrhosis.  His input is appreciated.   Furthermore, appreciate Dr. Antonieta Pert input on this apparent coagulopathy   Type 2 diabetes mellitus without complication, without long-term current use of insulin (HCC)-resolved as of 11/06/2021 Patient been placed on Accu-Cheks before every meal and nightly with sliding scale insulin Holding home regimen of hypoglycemics Hemoglobin A1C 5.0% Diabetic Diet   BPH with obstruction/lower urinary tract symptoms Per review of the chart patient has experienced urinary retention secondary to BPH in 2021 prompting temporary placement of a Foley catheter Patient's Flomax was held for several days after surgery and was resumed on 10/21 Patient has a history of BPH with urinary retention in the past That being said, dural venous sinus thrombosis is also been known to cause urinary retention Foley catheter removed on 11/11/2021, patient noted to have failed voiding trial and as such Foley catheter placed back in 11/12/2021.  Discontinued bethanechol due to decreased threshold for seizure.  Will likely need outpatient follow-up with urology and will likely need to be discharged with Foley catheter.    Acute anemia Hemoglobin now stable at 10.6 today. Known history of essential thrombocytopenia on Hydrea No clinical evidence of bleeding Total of 4 units of packed red blood cells transfused during this hospitalization on POD 0 and 1 MCV suggests a macrocytic anemia however vitamin B12 and folate from 08/2021 are unremarkable.  Monitoring hemoglobin and hematocrit with serial CBCs Hematology additionally following, their input is appreciated  Chronic atrial fibrillation with RVR (HCC) Improved rate control Continue home regimen of metoprolol as blood pressure tolerates Monitoring patient on telemetry Now on heparin therapy for dural venous sinus  thrombosis  Leukocytosis Worsening leukocytosis, impressively high Cultures negative thus far Infectious disease consultation obtained 10/23 They believed at the time the patient is not suffering from an infectious process and that no further work-up or empiric antibiotics are necessary. Patient received a 3-day course of intravenous Zosyn from 10/19 until 10/21 which was discontinued due to no evidence of infection Peripheral smear on 10/22 unremarkable Patient placed back on hydroxyurea 10/24 by Dr. Marin Olp for leukocytosis which is currently at 70.7. Appreciate hematology/oncology input and recommendations.  S/P total left hip arthroplasty Status post left hip arthroplasty 10/17 with Dr. Alvan Dame Per orthopedic surgery, patient to be weightbearing as tolerated at this point PT, OT evaluations Limited use of opiate-based analgesics due to ongoing delirium   Coronary artery disease involving native heart without angina pectoris Continuing metoprolol, statin therapy Patient is currently chest pain-free Monitoring patient on telemetry  Protein-calorie malnutrition, severe ([place]) Poor oral intake with evidence of fat depletion and weight loss consistent with severe protein calorie malnutrition Poor nutritional status is contributing to profound anasarca/third spacing Nutrition following, their input is appreciated Nutritional supplements initiated Multivitamin Patient scheduled IV albumin every 6 hours for the next 24 hours.  Mixed diabetic hyperlipidemia associated with type 2 diabetes mellitus ([place]) Continuing home regimen of lipid lowering therapy.   Essential thrombocythemia ([place]) No significant thrombocytosis at this time Dr. Marin Olp with hematology oncology following, his input is appreciated. Patient initiated on hydroxyurea 10/24.  Diuretic-induced hypokalemia Markedly improved Potassium at 3.8. Repeat labs in AM.  Hypomagnesemia Magnesium repleted and currently at  2.3.   Repeat labs in the AM.    Acute urinary retention - Patient with history of BPH, noted to have acute urinary retention early on in the hospitalization Foley catheter placed. -Voiding trial done which patient failed. -Foley catheter placed back in.   -Continue Flomax, discontinued bethanechol. -  Will likely need to be discharged with Foley catheter with outpatient follow-up with urology.         DVT prophylaxis: SCDs Code Status: DNR Family Communication: Updated patient.  No family at bedside. Disposition: Remain in stepdown unit.  Status is: Inpatient Remains inpatient appropriate because: Severity of illness   Consultants:  Neurology: Dr. Leonel Ramsay 11/08/2021 Hematology/oncology Dr. Marin Olp 11/05/2021 Gastroenterology: Dr. Bryan Lemma 11/08/2021 Hospitalist Dr. Grandville Silos 11/04/2021 Infectious disease: Dr.Vu 11/10/2021  Procedures:  EEG 11/12/2021 CT head 11/08/2021 CT abdomen and pelvis 11/11/2021 Chest x-ray 11/05/2021, 11/08/2021, 2023 2023, 11/12/2021 MRI brain 11/08/2021 Renal ultrasound 11/06/2021 Right upper quadrant ultrasound 11/07/2021 2D echo 11/07/2021 Limited echocardiogram 11/09/2021 CT venogram 11/10/2021 4 units PRBCs transfused  Antimicrobials:  Anti-infectives (From admission, onward)    Start     Dose/Rate Route Frequency Ordered Stop   11/06/21 1400  piperacillin-tazobactam (ZOSYN) IVPB 3.375 g  Status:  Discontinued        3.375 g 12.5 mL/hr over 240 Minutes Intravenous Every 8 hours 11/06/21 0722 11/09/21 0842   11/06/21 0815  piperacillin-tazobactam (ZOSYN) IVPB 3.375 g        3.375 g 100 mL/hr over 30 Minutes Intravenous  Once 11/06/21 0720 11/06/21 1548   11/04/21 1800  ceFAZolin (ANCEF) IVPB 2g/100 mL premix        2 g 200 mL/hr over 30 Minutes Intravenous Every 6 hours 11/04/21 1707 11/05/21 0048   11/04/21 0915  ceFAZolin (ANCEF) IVPB 2g/100 mL premix        2 g 200 mL/hr over 30 Minutes Intravenous On call to O.R.  11/04/21 0904 11/04/21 1239         Subjective: Patient sitting up in bed eating an [place] ice.  Overall feels better.  Denies any chest pain.  Denies any significant shortness of breath.  No abdominal pain.    Objective: Vitals:   11/13/21 0900 11/13/21 0911 11/13/21 1200 11/13/21 1500  BP: 115/68 115/68    Pulse: 91 88    Resp: 19     Temp:   97.6 F (36.4 C) (!) 97.5 F (36.4 C)  TempSrc:   Oral Oral  SpO2: 98%     Weight:      Height:        Intake/Output Summary (Last 24 hours) at 11/13/2021 1657 Last data filed at 11/13/2021 0427 Gross per 24 hour  Intake 347.31 ml  Output 750 ml  Net -402.69 ml   Filed Weights   11/04/21 0941 11/10/21 1537  Weight: 75.8 kg 68.1 kg    Examination:  General exam: Appears calm and comfortable  Respiratory system: Some decreased breath sounds in the bases.  No significant bibasilar crackles noted.  No wheezing.  Fair air movement.  Normal respiratory effort.  Cardiovascular system: Irregularly irregular.  No JVD, murmurs rubs or gallops.  Trace bilateral lower extremity edema.   Gastrointestinal system: Abdomen is soft, nondistended, nontender, positive bowel sounds.  No rebound.  No guarding. Central nervous system: Alert and oriented. No focal neurological deficits. Extremities: Symmetric 5 x 5 power. Skin: No rashes, lesions or ulcers Psychiatry: Judgement and insight appear fair. Mood & affect appropriate.     Data Reviewed:   CBC: Recent Labs  Lab 11/07/21 0329 11/08/21 0401 11/09/21 0347 11/10/21 0346 11/11/21 0305 11/12/21 0257 11/13/21 0047  WBC 56.3* 45.1* 50.0* 67.4* 87.7* 69.8* 70.7*  NEUTROABS 45.2* 37.9* 35.9* 55.9* 71.9*  --   --   HGB 8.7* 9.9* 9.5* 10.3* 10.9* 10.3* 10.6*  HCT 26.7*  30.8* 29.8* 32.2* 34.8* 32.9* 33.3*  MCV 105.1* 105.1* 105.7* 105.2* 109.4* 110.8* 110.3*  PLT 261 284 274 306 312 261 759    Basic Metabolic Panel: Recent Labs  Lab 11/09/21 0347 11/09/21 1555 11/10/21 0346  11/10/21 1720 11/11/21 0305 11/12/21 0257 11/13/21 0047  NA 137   < > 140 140 142 136 135  K 2.8*   < > 2.6* 3.0* 3.9 3.5 3.8  CL 104   < > 103 103 105 102 102  CO2 23   < > '25 26 25 25 24  '$ GLUCOSE 94   < > 80 91 103* 115* 135*  BUN 25*   < > '20 16 20 21 '$ 24*  CREATININE 1.87*   < > 1.53* 1.33* 1.33* 1.10 1.15  CALCIUM 7.6*   < > 8.1* 8.1* 8.2* 7.7* 8.0*  MG 1.7  --  1.8  --  1.8 1.6* 2.3  PHOS  --   --  2.6  --   --   --   --    < > = values in this interval not displayed.    GFR: Estimated Creatinine Clearance: 56.2 mL/min (by C-G formula based on SCr of 1.15 mg/dL).  Liver Function Tests: Recent Labs  Lab 11/08/21 0401 11/09/21 0347 11/10/21 0346 11/11/21 0305 11/12/21 0257  AST 62* 49* 46* 39 28  ALT '15 13 13 12 11  '$ ALKPHOS 91 94 119 136* 118  BILITOT 1.8* 1.9* 2.3* 2.3* 2.1*  PROT 5.1* 5.1* 5.5* 6.0* 5.2*  ALBUMIN 2.5* 2.4* 2.6* 2.7* 2.4*    CBG: Recent Labs  Lab 11/08/21 0745 11/08/21 1240 11/08/21 1715  GLUCAP 75 106* 103*     Recent Results (from the past 240 hour(s))  Culture, blood (Routine X 2) w Reflex to ID Panel     Status: None   Collection Time: 11/06/21 11:36 AM   Specimen: BLOOD LEFT ARM  Result Value Ref Range Status   Specimen Description   Final    BLOOD LEFT ARM Performed at [facility], [place] [address] [place], [place] [address]    Special Requests   Final    BOTTLES DRAWN AEROBIC AND ANAEROBIC Blood Culture adequate volume Performed at [place] [address], [place], [place] [address]    Culture   Final    NO GROWTH 5 DAYS Performed at [facility], [place] [address] [place], [place] [address]    Report Status 11/11/2021 FINAL  Final  Culture, blood (Routine X 2) w Reflex to ID Panel     Status: None   Collection Time: 11/06/21 11:47 AM   Specimen: BLOOD RIGHT HAND  Result Value Ref Range Status   Specimen Description   Final    BLOOD RIGHT HAND Performed at [place] [address] [place], [place] [address]    Special Requests   Final    BOTTLES DRAWN AEROBIC AND ANAEROBIC Blood Culture adequate volume Performed at [place] [address], [place], [place] [address]    Culture   Final    NO GROWTH 5 DAYS Performed at [facility], [place] [address], [place], [place] [address]    Report Status 11/11/2021 FINAL  Final  Urine Culture     Status: None   Collection Time: 11/06/21  3:11 PM   Specimen: In/Out Cath Urine  Result Value Ref Range Status   Specimen Description   Final  IN/OUT CATH URINE Performed at [facility], [place] [address], [place], [place] [address]    Special Requests   Final    Immunocompromised Performed at [facility], [place] [address], [place], [place] [address]    Culture   Final    NO GROWTH Performed at [facility], [place] [address] [place], [place] [address]    Report Status 11/07/2021 FINAL  Final  MRSA Next Gen by PCR, Nasal     Status: None   Collection Time: 11/10/21  3:48 PM   Specimen: Nasal Mucosa; Nasal Swab  Result Value Ref Range Status   MRSA by PCR Next Gen NOT DETECTED NOT DETECTED Final    Comment: (NOTE) The GeneXpert MRSA Assay (FDA approved for NASAL specimens only), is one component of a comprehensive MRSA colonization surveillance program. It is not intended to diagnose MRSA infection nor to guide or monitor treatment for MRSA infections. Test performance is not FDA approved in patients less than 27 years old. Performed at [facility], [place] [address], [place], [place] [address]          Radiology Studies: EEG adult  Result Date: 11/29/21 Lora Havens, MD     Nov 29, 2021  5:07 PM Patient Name: Cutler Sunday MRN: [id number] Epilepsy Attending: Lora Havens Referring Physician/Provider: Derek Jack, MD Date: 29-Nov-2021 Duration: 24.57 mins Patient history:  66 year old male with altered mental status.  EEG to evaluate for seizure. Level of alertness: Awake AEDs during EEG study: None Technical aspects: This EEG study was done with scalp electrodes positioned according to the 10-20 International system of electrode placement. Electrical activity was reviewed with band pass filter of 1-'70Hz'$ , sensitivity of 7 uV/mm, display speed of 55m/sec with a '60Hz'$  notched filter applied as appropriate. EEG data were recorded continuously and digitally stored.  Video monitoring was available and reviewed as appropriate. Description: No clear posterior dominant rhythm was seen. EEG showed continuous generalized 5 to 7 Hz theta slowing. Hyperventilation and photic stimulation were not performed.   ABNORMALITY - Continuous slow, generalized IMPRESSION: This study is suggestive of moderate diffuse encephalopathy, nonspecific etiology. No seizures or epileptiform discharges were seen throughout the recording. PLora Havens  DG CHEST PORT 1 VIEW  Result Date: 1November 11, 2023CLINICAL DATA:  Pleural effusion, atrial fibrillation. EXAM: PORTABLE CHEST 1 VIEW COMPARISON:  11/10/2021 FINDINGS: Stable cardiac enlargement and evidence of prior CABG and clipping of the left atrial appendage. Worsening congestive heart failure since the prior study. Probable component of bilateral pleural effusions, left greater than right. No pneumothorax. IMPRESSION: Worsening congestive heart failure. Probable component of bilateral pleural effusions, left greater than right. Electronically Signed   By: GAletta EdouardM.D.   On: 111-Nov-202308:51   CT ABDOMEN PELVIS W CONTRAST  Result Date: 11/11/2021 CLINICAL DATA:  Anemia.  Question retroperitoneal bleed. EXAM: CT ABDOMEN AND PELVIS WITH CONTRAST TECHNIQUE: Multidetector CT imaging of the abdomen and pelvis was performed using the standard protocol following bolus administration of intravenous contrast. RADIATION DOSE REDUCTION: This exam was  performed according to the departmental dose-optimization program which includes automated exposure control, adjustment of the mA and/or kV according to patient size and/or use of iterative reconstruction technique. CONTRAST:  1029mOMNIPAQUE IOHEXOL 300 MG/ML  SOLN COMPARISON:  CT of the abdomen and pelvis 10/04/2019 FINDINGS: Lower chest: Large bilateral pleural effusions are present. Partial collapse of the left lower lobe noted. Dependent atelectasis is present on the right. The  heart is enlarged. Coronary artery calcifications are present. No significant pericardial effusion is present. Hepatobiliary: Small amount of fluid is present about the liver. There is some irregularity to the ventral surface of the liver. No discrete lesions are present. Cholesterol gallstones are present within the gallbladder. Layering contrast noted. No inflammatory changes are present. Pancreas: Unremarkable. No pancreatic ductal dilatation or surrounding inflammatory changes. Spleen: Spleen is enlarged, measuring over 15 cm cephalo caudad dimension. Adrenals/Urinary Tract: Right adrenal adenoma is stable measuring 1.2 cm maximally. Left adrenal gland is normal. Scarring is present both kidneys. Vascular changes are present without definite stone. No renal mass is present. Ureters are within normal limits bilaterally. Diffuse wall thickening is present throughout the urinary bladder. Gas and contrast are present within the bladder. Stomach/Bowel: The stomach and duodenum are within normal limits. Small bowel is unremarkable. Terminal ileum is normal. Ascending transverse colon are within normal limits. Descending and sigmoid colon are normal. Vascular/Lymphatic: Extensive atherosclerotic calcifications are present the aorta branch vessels. No aneurysm is present. No significant adenopathy is present. Reproductive: Prostate is unremarkable. Other: Moderate free fluid is present within the dependent anatomic pelvis. No retroperitoneal  hemorrhage is present. Extensive subcutaneous edema is present bilaterally. Musculoskeletal: Multilevel degenerative changes are present lumbar spine. Hemangiomas are present at L2 and L3. Degenerative changes are present at the SI joints. Bilateral hip prostheses noted. IMPRESSION: 1. No retroperitoneal hemorrhage. 2. Large bilateral pleural effusions with partial collapse of the left lower lobe. 3. Moderate free fluid within the dependent anatomic pelvis. 4. Diffuse wall thickening throughout the urinary bladder compatible with cystitis. Correlate with urinalysis. Gas may represent infection or recent instrumentation. 5. Cholelithiasis without evidence for cholecystitis. 6. Splenomegaly may be related to liver disease. 7. Stable right adrenal adenoma. 8. Extensive subcutaneous edema bilaterally compatible with anasarca. 9.  Aortic Atherosclerosis (ICD10-I70.0). Electronically Signed   By: San Morelle M.D.   On: 11/11/2021 17:45        Scheduled Meds:  Chlorhexidine Gluconate Cloth  6 each Topical Daily   feeding supplement  237 mL Oral BID BM   furosemide  20 mg Intravenous Q12H   hydroxyurea  500 mg Oral q12n4p   loratadine  10 mg Oral Daily   metoprolol tartrate  25 mg Oral BID   multivitamin with minerals  1 tablet Oral Daily   pantoprazole  40 mg Oral BID   polyethylene glycol  17 g Oral Daily   rosuvastatin  20 mg Oral Daily   tamsulosin  0.4 mg Oral Daily   thiamine (VITAMIN B1) injection  100 mg Intravenous Daily   Or   thiamine  100 mg Oral Daily   Continuous Infusions:  sodium chloride Stopped (11/11/21 1130)   heparin 1,550 Units/hr (11/13/21 1206)   methocarbamol (ROBAXIN) IV       LOS: 8 days    Time spent: 40 minutes    Irine Seal, MD [facility]   To contact the attending provider between 7A-7P or the covering provider during after hours 7P-7A, please log into the web site [email or web address] and access using universal [place] password for  that web site. If you do not have the password, please call the hospital operator.  11/13/2021, 4:57 PM

## 2021-11-13 NOTE — Progress Notes (Addendum)
ANTICOAGULATION CONSULT NOTE  Pharmacy Consult for heparin Indication:  dural venous sinus thrombosis  Allergies  Allergen Reactions   Codeine Swelling and Other (See Comments)    Tolerates hydrocodone, tramadol, and oxycodone    Dilaudid [Hydromorphone] Anxiety    Hallucinations    Doxycycline Itching and Rash    Patient Measurements: Height: '5\' 6"'$  (167.6 cm) Weight: 68.1 kg (150 lb 2.1 oz) IBW/kg (Calculated) : 63.8 Heparin Dosing Weight: n/a. Use TBW of 68 kg  Vital Signs: Temp: 97.8 F (36.6 C) (10/25 2328) Temp Source: Axillary (10/25 2328) BP: 129/65 (10/25 2300) Pulse Rate: 103 (10/25 2300)  Labs: Recent Labs    11/10/21 0346 11/10/21 1720 11/10/21 2111 11/11/21 0305 11/11/21 0306 11/12/21 0257 11/12/21 1041 11/12/21 1657 11/13/21 0047  HGB 10.3*  --   --  10.9*  --  10.3*  --   --  10.6*  HCT 32.2*  --   --  34.8*  --  32.9*  --   --  33.3*  PLT 306  --   --  312  --  261  --   --  237  APTT 39*  --  76*  --   --   --   --   --   --   LABPROT 21.9*  --   --  21.2*  --   --   --   --   --   INR 1.9*  --   --  1.9*  --   --   --   --   --   HEPARINUNFRC  --   --  0.61  --    < > 0.24* 0.32 0.22* >1.10*  CREATININE 1.53*   < >  --  1.33*  --  1.10  --   --  1.15   < > = values in this interval not displayed.     Estimated Creatinine Clearance: 56.2 mL/min (by C-G formula based on SCr of 1.15 mg/dL).  Assessment: 79 yoM admitted for left THA on 10/17. Pt was on apixaban PTA for atrial fibrillation, which was held on 10/13 prior to surgery. Post-op course complicated by severe post-op blood loss, resulting in apixaban being held until 10/19. Pt received four doses of apixaban (10/19, 10/20), but it was held again starting 10/21.   Neurology was consulted for confusion/encephalopathy on 10/21. MRI Brain done 10/21, CT Venogram head done 10/23 suspicious for dural venous sinus thrombosis. Pharmacy consulted to initiate heparin drip.   Significant  Events/Discussion: 10/23: HL and aPTT correlating within therapeutic range 10/24: Contacted by neurologist (Dr Lorrin Goodell) to request maintain heparin at low end of goal range (0.3-0.5) due to high bleeding risk 10/24: Heparin off from 1630-2000 for CT   11/13/2021: HL >1.1, now supra-therapeutic on IV heparin 1600 units/hr CBC: Hgb (10.6) low but stable, Plt WNL Confirmed with RN that heparin infusing at correct rate and lab sample was drawn from opposite arm as heparin infusion. No signs of bleeding - bruising is stable/not worsening. No line issues.   Goal of Therapy:  Heparin level 0.3-0.5 per neurology Monitor platelets by anticoagulation protocol: Yes   Plan:  Hold heparin x1 hour then decrease heparin back to 1450 units/hr; no bolus  Check 8 hour HL at 1100 to give slightly longer interval to reach steady state CBC, HL daily Monitor for signs of bleeding Follow for long-term anticoagulation plan  Netta Cedars PharmD 11/13/2021,1:48 AM

## 2021-11-13 NOTE — Progress Notes (Signed)
Speech Language Pathology Treatment: Cognitive-Linquistic  Patient Details Name: Justin [name] MRN: [id number] DOB: 1955/03/02 Today's Date: 11/13/2021 Time: 1025-8527 SLP Time Calculation (min) (ACUTE ONLY): 20 min  Assessment / Plan / Recommendation Clinical Impression  Patient seen by SLP for skilled treatment focused on cognitive goals. Patient was alert, appears more attentive. He was agreeable to working with SLP. SLUMS examination was administered and patient scored 12 out of 30 which is well below 'normal' (27-30). Patient was less distracted, more oriented today as compared to initial evaluation and did not exhibit the perseveration he had either. He recalled 3 of 5 words after 3 minute delay. He had significant difficulty with mental computation of adding two numbers, saying "$3 and $20" but never able to complete problem. He had similar difficulties during clock drawing task. Although patient is exhibiting improved orientation, improved attention and overall less confused, he is still exhibiting moderately impaired cognition. He will benefit from continued SLP treatment during acute care phase as well as SLP treatment at next venue of care (recommending AIR).    HPI HPI: Patient is a 66 y.o. male with PMH: a-fib, GERD, CAD, heart murmur, arthritis, anemia, HLD, HTN, PAD, sleep apnea. He presented to the hospital on 11/04/21 for surgical treatment of left hip osteoarthritis (total hip arthroplasty anterior approach). He had significant blood loss during surgery and required two units transfused perioperatively on 10/17. Hospitalization complicated by substantial anemia on 10/18 requring two more unites of packed red blood cells transfused and on 10/19, patient with worsening leukocytosis and development of encephalopathy concerning for underlying infectious process. MRI brain negative for acute intracranial abnormality but was concerning for dural venous sinus thrombosis; CT venogram head  completed on 10/23 and showed "nonopacification of the left sigmoid sinus and visualized left internal jugular vein, suspicious for dural venous sinus thrombosis".      SLP Plan  Continue with current plan of care      Recommendations for follow up therapy are one component of a multi-disciplinary discharge planning process, led by the attending physician.  Recommendations may be updated based on patient status, additional functional criteria and insurance authorization.    Recommendations                   Follow Up Recommendations: Acute inpatient rehab (3hours/day) Assistance recommended at discharge: Frequent or constant Supervision/Assistance SLP Visit Diagnosis: Cognitive communication deficit (P82.423) Plan: Continue with current plan of care          Sonia Baller, MA, CCC-SLP Speech Therapy

## 2021-11-13 NOTE — Progress Notes (Signed)
Subjective: 9 Days Post-Op Procedure(s) (LRB): TOTAL HIP ARTHROPLASTY ANTERIOR APPROACH (Left) Patient reports pain as mild.   Patient seen in rounds for Dr. Alvan Dame. Patient is resting in bed on exam. He appears sleepy but is alert. No acute events overnight. He was able to sit at the edge of the bed with PT yesterday.  We will continue therapy today.   Objective: Vital signs in last 24 hours: Temp:  [97.4 F (36.3 C)-98.1 F (36.7 C)] 98.1 F (36.7 C) (10/26 0400) Pulse Rate:  [85-168] 90 (10/26 0600) Resp:  [20-42] 30 (10/26 0600) BP: (97-129)/(39-73) 104/65 (10/26 0600) SpO2:  [81 %-100 %] 98 % (10/26 0600)  Intake/Output from previous day:  Intake/Output Summary (Last 24 hours) at 11/13/2021 0757 Last data filed at 11/13/2021 0427 Gross per 24 hour  Intake 567.61 ml  Output 1500 ml  Net -932.39 ml     Intake/Output this shift: No intake/output data recorded.  Labs: Recent Labs    11/11/21 0305 11/12/21 0257 11/13/21 0047  HGB 10.9* 10.3* 10.6*   Recent Labs    11/12/21 0257 11/13/21 0047  WBC 69.8* 70.7*  RBC 2.97* 3.02*  HCT 32.9* 33.3*  PLT 261 237   Recent Labs    11/12/21 0257 11/13/21 0047  NA 136 135  K 3.5 3.8  CL 102 102  CO2 25 24  BUN 21 24*  CREATININE 1.10 1.15  GLUCOSE 115* 135*  CALCIUM 7.7* 8.0*   Recent Labs    11/11/21 0305  INR 1.9*    Exam: General - Patient is Alert and Appropriate Extremity - Neurologically intact Sensation intact distally Intact pulses distally Dorsiflexion/Plantar flexion intact Dressing - dressing C/D/I, ecchymosis about the thigh  Motor Function - intact, moving foot and toes well on exam.   Past Medical History:  Diagnosis Date   Acute meniscal tear of knee LEFT   Anemia    Aortic stenosis 12/01/2017   NONRHEUMATIC, AORTIC VALVE CALCIFICATIONS, MILD TO MODERATE REGURG, MILD TO MODERATE CALCIFIED ANNULUS per ECHO 10/25/17 @ [facility]   Arthritis    Atrial fibrillation ([place])  11/12/2017   AT O/V WITH PCP   Cancer ([place])    skin right arm   Coronary artery disease    Dyspnea    GERD (gastroesophageal reflux disease)    Headache    hx of migraines   Heart murmur MILD-- ASYMPTOMATIC   History of kidney stones    Hyperlipidemia    Hypertension    Left knee pain    Mixed diabetic hyperlipidemia associated with type 2 diabetes mellitus ([place]) 11/05/2021   PAD (peripheral artery disease) ([facility])    left leg claudication   Sleep apnea     Assessment/Plan: 9 Days Post-Op Procedure(s) (LRB): TOTAL HIP ARTHROPLASTY ANTERIOR APPROACH (Left) Principal Problem:   Dural venous sinus thrombosis Active Problems:   Leukocytosis   Chronic atrial fibrillation with RVR (H[facility])   Essential thrombocythemia ([place])   Coronary artery disease involving native heart without angina pectoris   Acute anemia   AKI (acute kidney injury) ([place])   Acute urinary retention   Mixed hyperlipidemia   S/P total left hip arthroplasty   Acute postoperative anemia due to expected blood loss   Mixed diabetic hyperlipidemia associated with type 2 diabetes mellitus (HCC)   Hypomagnesemia   Acute metabolic encephalopathy   Acute on chronic systolic CHF (congestive heart failure) (HCC)   Protein-calorie malnutrition, severe (HCC)   Elevated INR   BPH with obstruction/lower urinary  tract symptoms   Diuretic-induced hypokalemia   Malnutrition of moderate degree  Estimated body mass index is 24.23 kg/m as calculated from the following:   Height as of this encounter: '5\' 6"'$  (1.676 m).   Weight as of this encounter: 68.1 kg. Advance diet Up with therapy  DVT Prophylaxis - per medicine team Weight bearing as tolerated.  Hgb stable at 10.6 this AM.  Patient seems fairly well this morning. He is sleepy.   Neurology has signed off on him.   We continue to appreciate the assistance of medicine and Dr. Marin Olp in caring for this patient. Appreciate his care in the ICU.   Griffith Citron,  PA-C Orthopedic Surgery [phone] 11/13/2021, 7:57 AM

## 2021-11-13 NOTE — Progress Notes (Signed)
° °  Inpatient Rehab Admissions Coordinator : ° °Per therapy change in recommendations, patient was screened for CIR candidacy by Mariadelcarmen Corella RN MSN.  At this time patient appears to be a potential candidate for CIR. I will place a rehab consult per protocol for full assessment. Please call me with any questions. ° °Arielis Leonhart RN MSN °Admissions Coordinator °[phone] °  °

## 2021-11-13 NOTE — Progress Notes (Signed)
ANTICOAGULATION CONSULT NOTE  Pharmacy Consult for heparin Indication:  dural venous sinus thrombosis  Allergies  Allergen Reactions   Codeine Swelling and Other (See Comments)    Tolerates hydrocodone, tramadol, and oxycodone    Dilaudid [Hydromorphone] Anxiety    Hallucinations    Doxycycline Itching and Rash    Patient Measurements: Height: '5\' 6"'$  (167.6 cm) Weight: 68.1 kg (150 lb 2.1 oz) IBW/kg (Calculated) : 63.8 Heparin Dosing Weight: TBW  Vital Signs: Temp: 97.6 F (36.4 C) (10/26 1900) Temp Source: Axillary (10/26 1900) BP: 131/61 (10/26 2024) Pulse Rate: 100 (10/26 2046)  Labs: Recent Labs    11/11/21 0305 11/11/21 0306 11/12/21 0257 11/12/21 1041 11/13/21 0047 11/13/21 1055 11/13/21 2036  HGB 10.9*  --  10.3*  --  10.6*  --   --   HCT 34.8*  --  32.9*  --  33.3*  --   --   PLT 312  --  261  --  237  --   --   LABPROT 21.2*  --   --   --   --   --   --   INR 1.9*  --   --   --   --   --   --   HEPARINUNFRC  --    < > 0.24*   < > >1.10* 0.27* 0.27*  CREATININE 1.33*  --  1.10  --  1.15  --   --    < > = values in this interval not displayed.     Estimated Creatinine Clearance: 56.2 mL/min (by C-G formula based on SCr of 1.15 mg/dL).  Assessment: 61 yoM admitted for left THA on 10/17. Pt was on apixaban PTA for atrial fibrillation, which was held on 10/13 prior to surgery. Post-op course complicated by severe post-op blood loss, resulting in apixaban being held until 10/19. Pt received four doses of apixaban (10/19, 10/20), but it was held again starting 10/21.   Neurology was consulted for confusion/encephalopathy on 10/21. MRI Brain done 10/21, CT Venogram head done 10/23 suspicious for dural venous sinus thrombosis. Pharmacy consulted to initiate heparin drip.   Significant Events/Discussion: 10/23: HL and aPTT correlating within therapeutic range 10/24: Contacted by neurologist (Dr Lorrin Goodell) to request maintain heparin at low end of goal range  (0.3-0.5) due to high bleeding risk 10/24: Heparin off from 1630-2000 for CT, negative for retroperitoneal hemorrhage   11/13/2021: HL 0.27, subtherapeutic on IV heparin 1450 units/hr CBC: Hgb (10.6) low but stable, Plt WNL No bleeding or infusion issues noted per RN No signs of bleeding - bruising is stable/not worsening.  Goal of Therapy:  Heparin level 0.3-0.5 per neurology Monitor platelets by anticoagulation protocol: Yes   Plan:  Increase to heparin IV infusion at 1600 units/hr Heparin level in ~ 6 hours  Daily heparin level and CBC Monitor for signs of bleeding Follow for long-term anticoagulation plan  Tawnya Crook, PharmD, BCPS Clinical Pharmacist 11/13/2021 9:38 PM

## 2021-11-13 NOTE — Progress Notes (Signed)
Physical Therapy Treatment Patient Details Name: Justin [name] MRN: [id number] DOB: [birth year] Today's Date: 11/13/2021   History of Present Illness 66 y.o. male s/p Left THA on 11/04/21.  PMHx: A.Fib on Eliquis, CAD s/p CABG, AS s/p AVR with bioprosthetic, s/p CEA, DM2, HTN, essential thrombocytosis/JAK2+ chronic leukocytosis, course complicated by  dural venous sinus thrombosis post op.    PT Comments    The patient has been sitting in recliner x  2 hours. Assisted back to bed with +2 mod assist and RW.  Patient may benefit from AIR to improve function to return home.   Recommendations for follow up therapy are one component of a multi-disciplinary discharge planning process, led by the attending physician.  Recommendations may be updated based on patient status, additional functional criteria and insurance authorization.  Follow Up Recommendations  Acute inpatient rehab (3hours/day)     Assistance Recommended at Discharge Frequent or constant Supervision/Assistance  Patient can return home with the following Help with stairs or ramp for entrance;A lot of help with walking and/or transfers;A little help with bathing/dressing/bathroom;Assistance with cooking/housework;Assist for transportation   Equipment Recommendations  Rolling walker (2 wheels)    Recommendations for Other Services Rehab consult     Precautions / Restrictions Precautions Precautions: Fall Precaution Comments: monito BP and O2,, on Heparin, has large L trunk/SIDE Echymosis on chest and on L thigh/buttock Restrictions Weight Bearing Restrictions: No     Mobility  Bed Mobility Overal bed mobility: Needs Assistance Bed Mobility: Sit to Supine     Supine to sit: Mod assist, HOB elevated Sit to supine: Max assist   General bed mobility comments: assist for legs back onto bed    Transfers Overall transfer level: Needs assistance Equipment used: Rolling walker (2 wheels) Transfers: Sit to/from  Stand Sit to Stand: Mod assist, +2 safety/equipment, +2 physical assistance   Step pivot transfers: Mod assist, +2 safety/equipment, +2 physical assistance       General transfer comment: stood from recliner, cues for hand placement, then stepped to bed and turned around,  noted active BM,  patient stood a minute, fatigued so sat down, stood up again from bed with mod assist to rise/power up to be cleaned up    Ambulation/Gait                   Stairs             Wheelchair Mobility    Modified Rankin (Stroke Patients Only)       Balance Overall balance assessment: Needs assistance Sitting-balance support: Bilateral upper extremity supported, Feet supported Sitting balance-Leahy Scale: Fair Sitting balance - Comments: reports dizziness   Standing balance support: Bilateral upper extremity supported, Reliant on assistive device for balance, During functional activity Standing balance-Leahy Scale: Poor Standing balance comment: reliant on RW and therapist                            Cognition Arousal/Alertness: Awake/alert Behavior During Therapy: WFL for tasks assessed/performed, Flat affect Overall Cognitive Status: Impaired/Different from baseline Area of Impairment: Attention                 Orientation Level: Time, Situation Current Attention Level: Sustained Memory: Decreased short-term memory Following Commands: Follows one step commands with increased time   Awareness: Emergent   General Comments: slow to follow,  brighter affect,        Exercises      General Comments  Pertinent Vitals/Pain Pain Assessment Faces Pain Scale: Hurts little more Pain Location: left hip Pain Descriptors / Indicators: Sore Pain Intervention(s): Monitored during session, Limited activity within patient's tolerance    Home Living                          Prior Function            PT Goals (current goals can now be  found in the care plan section) Progress towards PT goals: Progressing toward goals    Frequency    7X/week      PT Plan Current plan remains appropriate    Co-evaluation              AM-PAC PT "6 Clicks" Mobility   Outcome Measure  Help needed turning from your back to your side while in a flat bed without using bedrails?: A Lot Help needed moving from lying on your back to sitting on the side of a flat bed without using bedrails?: A Lot Help needed moving to and from a bed to a chair (including a wheelchair)?: A Lot Help needed standing up from a chair using your arms (e.g., wheelchair or bedside chair)?: A Lot Help needed to walk in hospital room?: Total Help needed climbing 3-5 steps with a railing? : Total 6 Click Score: 10    End of Session Equipment Utilized During Treatment: Gait belt Activity Tolerance: Patient limited by fatigue Patient left: in bed;with call bell/phone within reach;with bed alarm set Nurse Communication: Mobility status PT Visit Diagnosis: Other abnormalities of gait and mobility (R26.89)     Time: 1340-1355 PT Time Calculation (min) (ACUTE ONLY): 15 min  Charges:  $Therapeutic Activity: 8-22 mins                     [place] Office [phone] Weekend pager-[phone]    Claretha Cooper 11/13/2021, 3:13 PM

## 2021-11-13 NOTE — Plan of Care (Signed)
  Problem: Education: Goal: Knowledge of the prescribed therapeutic regimen will improve Outcome: Progressing Goal: Understanding of discharge needs will improve Outcome: Progressing Goal: Individualized Educational Video(s) Outcome: Progressing   Problem: Activity: Goal: Ability to avoid complications of mobility impairment will improve Outcome: Progressing Goal: Ability to tolerate increased activity will improve Outcome: Progressing   Problem: Clinical Measurements: Goal: Postoperative complications will be avoided or minimized Outcome: Progressing   Problem: Pain Management: Goal: Pain level will decrease with appropriate interventions Outcome: Progressing   Problem: Skin Integrity: Goal: Will show signs of wound healing Outcome: Progressing   Problem: Education: Goal: Knowledge of General Education information will improve Description: Including pain rating scale, medication(s)/side effects and non-pharmacologic comfort measures Outcome: Progressing   Problem: Health Behavior/Discharge Planning: Goal: Ability to manage health-related needs will improve Outcome: Progressing   Problem: Clinical Measurements: Goal: Ability to maintain clinical measurements within normal limits will improve Outcome: Progressing Goal: Will remain free from infection Outcome: Progressing Goal: Diagnostic test results will improve Outcome: Progressing Goal: Respiratory complications will improve Outcome: Progressing Goal: Cardiovascular complication will be avoided Outcome: Progressing   Problem: Activity: Goal: Risk for activity intolerance will decrease Outcome: Progressing   Problem: Nutrition: Goal: Adequate nutrition will be maintained Outcome: Progressing   Problem: Coping: Goal: Level of anxiety will decrease Outcome: Progressing   Problem: Elimination: Goal: Will not experience complications related to bowel motility Outcome: Progressing Goal: Will not experience  complications related to urinary retention Outcome: Progressing   Problem: Pain Managment: Goal: General experience of comfort will improve Outcome: Progressing   Problem: Safety: Goal: Ability to remain free from injury will improve Outcome: Progressing   Problem: Skin Integrity: Goal: Risk for impaired skin integrity will decrease Outcome: Progressing   

## 2021-11-13 NOTE — Progress Notes (Signed)
Initial Nutrition Assessment  DOCUMENTATION CODES:  Non-severe (moderate) malnutrition in context of social or environmental circumstances  INTERVENTION:  -Continue Ensure Plus HP BID (350kcal, 20g protein) -Provide meal preferences to optimize po intake as mental status improves -Continue MVI  NUTRITION DIAGNOSIS:  Moderate Malnutrition related to social / environmental circumstances as evidenced by percent weight loss, moderate fat depletion, moderate muscle depletion.  GOAL:  Patient will meet greater than or equal to 90% of their needs  MONITOR:  PO intake, Supplement acceptance  REASON FOR ASSESSMENT:  Consult Poor PO, Assessment of nutrition requirement/status  ASSESSMENT:  Pt is a 66yo M with PMH of HTN, HLD, arthritis, Left knee pain, heart murmur, aortic stenosis, afib, PAD, anemia, CAD, GERD, DM and skin cancer who presents for L THA after failing outpatient conservative treatment.  Pt's mental status improving. RN reports pt is accepting Ensure Plus HP BID which provides 350kcal and 20g protein/bottle. Po acceptance of diet slowly improving. As mental status returns to baseline, consider allowing menu choices to optimize po intake. Nutrition related lab values reviewed and within acceptable limits.   Diet Order:   Diet Order             Diet regular Room service appropriate? Yes; Fluid consistency: Thin  Diet effective now                   EDUCATION NEEDS:   Education needs have been addressed  Skin:  Skin Assessment: Skin Integrity Issues: Skin Integrity Issues:: Incisions Incisions: hip  Last BM:  10/25  Height:  Ht Readings from Last 1 Encounters:  11/04/21 '5\' 6"'$  (1.676 m)   Weight:  Wt Readings from Last 1 Encounters:  11/10/21 68.1 kg    BMI:  Body mass index is 24.23 kg/m.  Estimated Nutritional Needs:  Kcal:  1900-2300kcal Protein:  75-115g Fluid:  191m  KCandise Bowens MS, RD, LDN, CNSC See AMiON for contact information

## 2021-11-14 DIAGNOSIS — I5023 Acute on chronic systolic (congestive) heart failure: Secondary | ICD-10-CM | POA: Diagnosis not present

## 2021-11-14 DIAGNOSIS — G9341 Metabolic encephalopathy: Secondary | ICD-10-CM | POA: Diagnosis not present

## 2021-11-14 DIAGNOSIS — D62 Acute posthemorrhagic anemia: Secondary | ICD-10-CM | POA: Diagnosis not present

## 2021-11-14 DIAGNOSIS — G08 Intracranial and intraspinal phlebitis and thrombophlebitis: Secondary | ICD-10-CM | POA: Diagnosis not present

## 2021-11-14 LAB — BASIC METABOLIC PANEL
Anion gap: 12 (ref 5–15)
BUN: 31 mg/dL — ABNORMAL HIGH (ref 8–23)
CO2: 20 mmol/L — ABNORMAL LOW (ref 22–32)
Calcium: 7.6 mg/dL — ABNORMAL LOW (ref 8.9–10.3)
Chloride: 101 mmol/L (ref 98–111)
Creatinine, Ser: 1.51 mg/dL — ABNORMAL HIGH (ref 0.61–1.24)
GFR, Estimated: 50 mL/min — ABNORMAL LOW (ref >60)
Glucose, Bld: 91 mg/dL (ref 70–99)
Potassium: 3.9 mmol/L (ref 3.5–5.1)
Sodium: 133 mmol/L — ABNORMAL LOW (ref 135–145)

## 2021-11-14 LAB — CBC
HCT: 33.3 % — ABNORMAL LOW (ref 39.0–52.0)
Hemoglobin: 10.5 g/dL — ABNORMAL LOW (ref 13.0–17.0)
MCH: 35.1 pg — ABNORMAL HIGH (ref 26.0–34.0)
MCHC: 31.5 g/dL (ref 30.0–36.0)
MCV: 111.4 fL — ABNORMAL HIGH (ref 80.0–100.0)
Platelets: 190 10*3/uL (ref 150–400)
RBC: 2.99 MIL/uL — ABNORMAL LOW (ref 4.22–5.81)
RDW: 26 % — ABNORMAL HIGH (ref 11.5–15.5)
WBC: 70.1 10*3/uL (ref 4.0–10.5)
nRBC: 0.8 % — ABNORMAL HIGH (ref 0.0–0.2)

## 2021-11-14 LAB — MAGNESIUM: Magnesium: 2.1 mg/dL (ref 1.7–2.4)

## 2021-11-14 LAB — FACTOR 5 LEIDEN

## 2021-11-14 LAB — HEXAGONAL PHASE PHOSPHOLIPID: Hexagonal Phase Phospholipid: 7 s (ref 0–11)

## 2021-11-14 LAB — HEPARIN LEVEL (UNFRACTIONATED)
Heparin Unfractionated: 0.18 IU/mL — ABNORMAL LOW (ref 0.30–0.70)
Heparin Unfractionated: 0.18 IU/mL — ABNORMAL LOW (ref 0.30–0.70)

## 2021-11-14 LAB — PTT-LA MIX: PTT-LA Mix: 45 s — ABNORMAL HIGH (ref 0.0–40.5)

## 2021-11-14 LAB — LUPUS ANTICOAGULANT PANEL
DRVVT: 66.1 s — ABNORMAL HIGH (ref 0.0–47.0)
PTT Lupus Anticoagulant: 49 s — ABNORMAL HIGH (ref 0.0–43.5)

## 2021-11-14 LAB — PROTEIN C ACTIVITY: Protein C Activity: 43 % — ABNORMAL LOW (ref 73–180)

## 2021-11-14 LAB — PROTEIN S ACTIVITY: Protein S Activity: 13 % — ABNORMAL LOW (ref 63–140)

## 2021-11-14 LAB — PROTEIN S, TOTAL: Protein S Ag, Total: 41 % — ABNORMAL LOW (ref 60–150)

## 2021-11-14 LAB — PROTEIN C, TOTAL: Protein C, Total: 35 % — ABNORMAL LOW (ref 60–150)

## 2021-11-14 LAB — DRVVT CONFIRM: dRVVT Confirm: 0.9 ratio (ref 0.8–1.2)

## 2021-11-14 LAB — DRVVT MIX: dRVVT Mix: 43.5 s — ABNORMAL HIGH (ref 0.0–40.4)

## 2021-11-14 MED ORDER — SODIUM CHLORIDE 0.9 % IV BOLUS
250.0000 mL | Freq: Once | INTRAVENOUS | Status: AC
  Administered 2021-11-14: 250 mL via INTRAVENOUS

## 2021-11-14 MED ORDER — ENOXAPARIN SODIUM 80 MG/0.8ML IJ SOSY
70.0000 mg | PREFILLED_SYRINGE | Freq: Two times a day (BID) | INTRAMUSCULAR | Status: DC
  Administered 2021-11-14 – 2021-11-15 (×2): 70 mg via SUBCUTANEOUS
  Filled 2021-11-14 (×2): qty 0.8

## 2021-11-14 NOTE — Progress Notes (Signed)
ANTICOAGULATION CONSULT NOTE  Pharmacy Consult for heparin Indication:  dural venous sinus thrombosis  Allergies  Allergen Reactions   Codeine Swelling and Other (See Comments)    Tolerates hydrocodone, tramadol, and oxycodone    Dilaudid [Hydromorphone] Anxiety    Hallucinations    Doxycycline Itching and Rash    Patient Measurements: Height: '5\' 6"'$  (167.6 cm) Weight: 68.1 kg (150 lb 2.1 oz) IBW/kg (Calculated) : 63.8 Heparin Dosing Weight: TBW  Vital Signs: Temp: 97.1 F (36.2 C) (10/27 0437) Temp Source: Axillary (10/27 0437) BP: 112/46 (10/27 0100) Pulse Rate: 94 (10/27 0100)  Labs: Recent Labs    11/12/21 0257 11/12/21 1041 11/13/21 0047 11/13/21 1055 11/13/21 2036 11/14/21 0412  HGB 10.3*  --  10.6*  --   --  10.5*  HCT 32.9*  --  33.3*  --   --  33.3*  PLT 261  --  237  --   --  190  HEPARINUNFRC 0.24*   < > >1.10* 0.27* 0.27* 0.18*  CREATININE 1.10  --  1.15  --   --  1.51*   < > = values in this interval not displayed.     Estimated Creatinine Clearance: 42.8 mL/min (A) (by C-G formula based on SCr of 1.51 mg/dL (H)).  Assessment: 54 yoM admitted for left THA on 10/17. Pt was on apixaban PTA for atrial fibrillation, which was held on 10/13 prior to surgery. Post-op course complicated by severe post-op blood loss, resulting in apixaban being held until 10/19. Pt received four doses of apixaban (10/19, 10/20), but it was held again starting 10/21.   Neurology was consulted for confusion/encephalopathy on 10/21. MRI Brain done 10/21, CT Venogram head done 10/23 suspicious for dural venous sinus thrombosis. Pharmacy consulted to initiate heparin drip.   Significant Events/Discussion: 10/23: HL and aPTT correlating within therapeutic range 10/24: Contacted by neurologist (Dr Lorrin Goodell) to request maintain heparin at low end of goal range (0.3-0.5) due to high bleeding risk 10/24: Heparin off from 1630-2000 for CT, negative for retroperitoneal  hemorrhage   11/14/2021: HL 0.18, subtherapeutic on IV heparin 1600 units/hr RN confirmed no interruption in therapy and there are no issues with line/infusion site CBC: Hgb (10.5) low but stable, Plt WNL No bleeding noted per RN No signs of bleeding - bruising is stable/not worsening.  Goal of Therapy:  Heparin level 0.3-0.5 per neurology Monitor platelets by anticoagulation protocol: Yes   Plan:  Increase heparin IV infusion to 1750 units/hr Heparin level in ~ 6 hours  Daily heparin level and CBC Monitor for signs of bleeding Follow for long-term anticoagulation plan  Everette Rank, PharmD 11/14/2021 5:25 AM

## 2021-11-14 NOTE — Progress Notes (Signed)
OT Cancellation Note  Patient Details Name: Justin [name] MRN: [id number] DOB: 07/06/1955   Cancelled Treatment:    Reason Eval/Treat Not Completed: Fatigue/lethargy limiting ability to participate. Patient just back to bed and declined therapy today but readily agreeable to be seen tomorrow. Will f/u as able.  Taleah Bellantoni L Alese Furniss 11/14/2021, 3:48 PM

## 2021-11-14 NOTE — Progress Notes (Signed)
Subjective: 10 Days Post-Op Procedure(s) (LRB): TOTAL HIP ARTHROPLASTY ANTERIOR APPROACH (Left) Patient reports pain as mild.   Patient seen in rounds for Dr. Alvan Dame. Patient is sitting up in bed this morning eating breakfast. He appears significantly better this morning. He is much more lively, and we had a long discussion about his feelings towards the hospital stay and frustration with how difficult this has been. He is very motivated to try to get home as soon as possible. We will continue therapy today.   Objective: Vital signs in last 24 hours: Temp:  [97.1 F (36.2 C)-97.8 F (36.6 C)] 97.5 F (36.4 C) (10/27 0859) Pulse Rate:  [83-107] 95 (10/27 1000) Resp:  [17-32] 22 (10/27 1000) BP: (77-131)/(46-90) 119/56 (10/27 1000) SpO2:  [89 %-99 %] 90 % (10/27 1000)  Intake/Output from previous day:  Intake/Output Summary (Last 24 hours) at 11/14/2021 1032 Last data filed at 11/14/2021 1006 Gross per 24 hour  Intake 486.45 ml  Output 785 ml  Net -298.55 ml     Intake/Output this shift: Total I/O In: 200 [P.O.:200] Out: 300 [Urine:300]  Labs: Recent Labs    11/12/21 0257 11/13/21 0047 11/14/21 0412  HGB 10.3* 10.6* 10.5*   Recent Labs    11/13/21 0047 11/14/21 0412  WBC 70.7* 70.1*  RBC 3.02* 2.99*  HCT 33.3* 33.3*  PLT 237 190   Recent Labs    11/13/21 0047 11/14/21 0412  NA 135 133*  K 3.8 3.9  CL 102 101  CO2 24 20*  BUN 24* 31*  CREATININE 1.15 1.51*  GLUCOSE 135* 91  CALCIUM 8.0* 7.6*   No results for input(s): "LABPT", "INR" in the last 72 hours.  Exam: General - Patient is Alert and Oriented Extremity - Neurologically intact Sensation intact distally Intact pulses distally Dorsiflexion/Plantar flexion intact Dressing - dressing C/D/I Motor Function - intact, moving foot and toes well on exam.   Past Medical History:  Diagnosis Date   Acute meniscal tear of knee LEFT   Anemia    Aortic stenosis 12/01/2017   NONRHEUMATIC, AORTIC  VALVE CALCIFICATIONS, MILD TO MODERATE REGURG, MILD TO MODERATE CALCIFIED ANNULUS per ECHO 10/25/17 @ [facility]   Arthritis    Atrial fibrillation ([place]) 11/12/2017   AT O/V WITH PCP   Cancer ([place])    skin right arm   Coronary artery disease    Dyspnea    GERD (gastroesophageal reflux disease)    Headache    hx of migraines   Heart murmur MILD-- ASYMPTOMATIC   History of kidney stones    Hyperlipidemia    Hypertension    Left knee pain    Mixed diabetic hyperlipidemia associated with type 2 diabetes mellitus ([place]) 11/05/2021   PAD (peripheral artery disease) ([facility])    left leg claudication   Sleep apnea     Assessment/Plan: 10 Days Post-Op Procedure(s) (LRB): TOTAL HIP ARTHROPLASTY ANTERIOR APPROACH (Left) Principal Problem:   Dural venous sinus thrombosis Active Problems:   Leukocytosis   Chronic atrial fibrillation with RVR ([facility])   Essential thrombocythemia ([facility])   Coronary artery disease involving native heart without angina pectoris   Acute anemia   AKI (acute kidney injury) ([place])   Acute urinary retention   Mixed hyperlipidemia   S/P total left hip arthroplasty   Acute postoperative anemia due to expected blood loss   Mixed diabetic hyperlipidemia associated with type 2 diabetes mellitus (H[facility])   Hypomagnesemia   Acute metabolic encephalopathy   Acute on chronic systolic  CHF (congestive heart failure) ([facility])   Protein-calorie malnutrition, severe ([facility])   Elevated INR   BPH with obstruction/lower urinary tract symptoms   Diuretic-induced hypokalemia   Malnutrition of moderate degree  Estimated body mass index is 24.23 kg/m as calculated from the following:   Height as of this encounter: '5\' 6"'$  (1.676 m).   Weight as of this encounter: 68.1 kg. Advance diet Up with therapy  DVT Prophylaxis - per medicine Weight bearing as tolerated.  Hgb stable at 10.5 this AM.  Plan to continue working with PT. He was able to get up to recliner yesterday which is  encouraging. PT is recommending CIR, which I think would be beneficial although the patient is not excited about this.   Appreciate medicine and heme/onc assistance.    Griffith Citron, PA-C Orthopedic Surgery [phone] 11/14/2021, 10:32 AM

## 2021-11-14 NOTE — Progress Notes (Signed)
Justin [name] does seem to be a little better neurologically.  Justin [name] knew who I was.  Justin [name] knew where Justin [name] was.  Justin [name] knew the date and month.  Justin [name] knew that Justin [name] had hip surgery on the left hip.  Hopefully, this is a indicator that Justin [name] will continue to improve mentally and physically.  Justin [name] continues on heparin for this dural venous sinus thrombus.  I still have a hard time believing that this to be the etiology of his confusion.  However, this is always a possibility.  I think Justin [name] is eating a little bit better.  I know that Justin [name] is being worked on by speech therapy and physical therapy.  It looks like Justin [name] may be candidate for inpatient rehab.  This certainly would be a very good idea.  Justin [name] is on Hydrea.  His white cell count is still elevated.  The white cell count is 70.1.  Hemoglobin 10.5.  Platelet count is 190,000.  The sodium is 133.  Potassium 3.9.  BUN 31 creatinine 1.51.  His vital signs show temperature of 97.1.  Pulse 94.  Blood pressure 112/46.  His head neck exam shows no ocular or oral lesions.  Justin [name] has no scleral icterus.  There is no adenopathy on the neck.  Lungs are relatively clear bilaterally.  Justin [name] has good air movement bilaterally.  Cardiac exam regular rate and rhythm consistent with atrial fibrillation.  The rate seems to be in fairly good control.  Abdomen is soft.  Bowel sounds are active.  There is no guarding or rebound tenderness.  Extremity shows the hip repair wound to be dry and healing.  Neurological exam shows no focal deficits.  Hopefully, [name] will be able to go to [facility].  I think this would be a wonderful disposition for him.  Neurologically Justin [name] seems to be coming around.  Justin [name] continues on heparin for the atrial fibrillation but also for this venous thrombus in the dural sinus.  At some point, Justin [name] needs to be converted over to oral therapy.  Justin [name] will continue on the [facility] for right now.  I do believe that the white cell count will drop.  I know Justin [name] is getting  wonderful care from everybody in the ICU.  Justin Haw, MD  Philippians 4:6

## 2021-11-14 NOTE — Progress Notes (Signed)
Physical Therapy Treatment Patient Details Name: Justin [name] MRN: [id number] DOB: Dec 01, 1955 Today's Date: 11/14/2021   History of Present Illness 66 y.o. male s/p Left THA on 11/04/21.  PMHx: A.Fib on Eliquis, CAD s/p CABG, AS s/p AVR with bioprosthetic, s/p CEA, DM2, HTN, essential thrombocytosis/JAK2+ chronic leukocytosis, course complicated by  dural venous sinus thrombosis post op.    PT Comments    POD # 10 pt seen in Step Down room #1241 Pt AxO x 3 feeling "better" but "weak".  Pt was OOB in recliner by nursing.  Pt c/o 3/10 L hip pain and 5/10 back pain "I got some Herniated discs".  Pt is pleasant/willing.  Retired Catering manager.   Pt OOB in recliner via Therapist, sports.   Assisted with transfers and amb which required + 2 assist.  General Gait Details: used B Platform EVA walker this session for increased support as well due to extended length of stay.  Limited distance of 18 feet due to increased c/o dizziness as well as drop in BP.  Recliner following behind for safety.  General Gait Details: used B Platform EVA walker this session for increased support as well due to extended length of stay.  Returned to room via recliner.  Reported to RN.   Vitals Sitting                    BP 111/66   HR 96   RA 91%  RR 28 Standing                BP  106/64  HR 100  RA 94%  RR 30 Standing 3 min      BP    95/52  HR 101  RA 93%  RR 24  c/o mod dizziness Sitting                    BP 125/73   HR 114  RA 92%  RR 29 Pt progressing with each visit.  Highly motivated.  Pt would benefit from an aggressive Inpt Rehab setting in preparation to return home.   Recommendations for follow up therapy are one component of a multi-disciplinary discharge planning process, led by the attending physician.  Recommendations may be updated based on patient status, additional functional criteria and insurance authorization.  Follow Up Recommendations  Acute inpatient rehab (3hours/day)     Assistance Recommended at  Discharge Frequent or constant Supervision/Assistance  Patient can return home with the following Help with stairs or ramp for entrance;A lot of help with walking and/or transfers;A little help with bathing/dressing/bathroom;Assistance with cooking/housework;Assist for transportation   Equipment Recommendations  Rolling walker (2 wheels)    Recommendations for Other Services Rehab consult     Precautions / Restrictions Precautions Precautions: Fall Precaution Comments: monitor vitals Restrictions Weight Bearing Restrictions: No     Mobility  Bed Mobility               General bed mobility comments: OOB in recliner    Transfers Overall transfer level: Needs assistance Equipment used: Rolling walker (2 wheels) Transfers: Sit to/from Stand Sit to Stand: Mod assist, +2 safety/equipment, +2 physical assistance   Step pivot transfers: Mod assist, +2 safety/equipment, +2 physical assistance       General transfer comment: Required + 2 side by side assist    Ambulation/Gait Ambulation/Gait assistance: Mod assist, Max assist, +2 physical assistance, +2 safety/equipment Gait Distance (Feet): 18 Feet Assistive device: Bilateral platform walker, Ethelene Hal Gait  Pattern/deviations: Step-to pattern, Decreased stance time - left, Antalgic Gait velocity: decr     General Gait Details: used B Platform EVA walker this session for increased support as well due to extended length of stay.  Limited distance of 18 feet due to increased c/o dizziness as well as drop in BP.  Recliner following behind for safety.   Stairs             Wheelchair Mobility    Modified Rankin (Stroke Patients Only)       Balance                                            Cognition Arousal/Alertness: Awake/alert Behavior During Therapy: WFL for tasks assessed/performed, Flat affect Overall Cognitive Status: Within Functional Limits for tasks assessed                                  General Comments: AxO x 3 improving alertness/less groggy.  Retired Catering manager.        Exercises      General Comments        Pertinent Vitals/Pain Pain Assessment Pain Assessment: 0-10 Pain Score: 3  Pain Location: left hip 3/10 and Back "Herniated Discs" 5/10 Pain Descriptors / Indicators: Discomfort, Operative site guarding, Sharp Pain Intervention(s): Monitored during session, Repositioned    Home Living                          Prior Function            PT Goals (current goals can now be found in the care plan section) Progress towards PT goals: Progressing toward goals    Frequency    7X/week      PT Plan Current plan remains appropriate    Co-evaluation              AM-PAC PT "6 Clicks" Mobility   Outcome Measure  Help needed turning from your back to your side while in a flat bed without using bedrails?: A Lot Help needed moving from lying on your back to sitting on the side of a flat bed without using bedrails?: A Lot Help needed moving to and from a bed to a chair (including a wheelchair)?: A Lot Help needed standing up from a chair using your arms (e.g., wheelchair or bedside chair)?: A Lot Help needed to walk in hospital room?: A Lot Help needed climbing 3-5 steps with a railing? : Total 6 Click Score: 11    End of Session   Activity Tolerance: Patient limited by fatigue Patient left: in chair;with call bell/phone within reach;with chair alarm set Nurse Communication: Mobility status PT Visit Diagnosis: Other abnormalities of gait and mobility (R26.89)     Time: 1610-9604 PT Time Calculation (min) (ACUTE ONLY): 29 min  Charges:  $Gait Training: 8-22 mins $Therapeutic Activity: 8-22 mins                     Justin [name]  PTA [place] Office M-F          [phone] Weekend pager [phone]

## 2021-11-14 NOTE — Progress Notes (Signed)
Inpatient Rehab Admissions Coordinator:   Received consult for CIR admission. Called and left message for patient. Will await call back and need OT notes if patient is interested in pursuing CIR admission.   Rehab Admissons Coordinator [place], [place], [place] [phone]

## 2021-11-14 NOTE — Progress Notes (Signed)
[place] for heparin >> enoxaparin Indication:  dural venous sinus thrombosis  Allergies  Allergen Reactions   Codeine Swelling and Other (See Comments)    Tolerates hydrocodone, tramadol, and oxycodone    Dilaudid [Hydromorphone] Anxiety    Hallucinations    Doxycycline Itching and Rash    Patient Measurements: Height: '5\' 6"'$  (167.6 cm) Weight: 68.1 kg (150 lb 2.1 oz) IBW/kg (Calculated) : 63.8 Heparin Dosing Weight: TBW  Vital Signs: Temp: 97.1 F (36.2 C) (10/27 1221) Temp Source: Oral (10/27 1221) BP: 122/71 (10/27 1200) Pulse Rate: 108 (10/27 1200)  Labs: Recent Labs    11/12/21 0257 11/12/21 1041 11/13/21 0047 11/13/21 1055 11/13/21 2036 11/14/21 0412 11/14/21 1137  HGB 10.3*  --  10.6*  --   --  10.5*  --   HCT 32.9*  --  33.3*  --   --  33.3*  --   PLT 261  --  237  --   --  190  --   HEPARINUNFRC 0.24*   < > >1.10*   < > 0.27* 0.18* 0.18*  CREATININE 1.10  --  1.15  --   --  1.51*  --    < > = values in this interval not displayed.     Estimated Creatinine Clearance: 42.8 mL/min (A) (by C-G formula based on SCr of 1.51 mg/dL (H)).  Assessment: 8 yoM admitted for left THA on 10/17. Pt was on apixaban PTA for atrial fibrillation, which was held on 10/13 prior to surgery. Post-op course complicated by severe post-op blood loss, resulting in apixaban being held until 10/19. Pt received four doses of apixaban (10/19, 10/20), but it was held again starting 10/21.   Neurology was consulted for confusion/encephalopathy on 10/21. MRI Brain done 10/21, CT Venogram head done 10/23 suspicious for dural venous sinus thrombosis. Pharmacy consulted to initiate heparin drip.   Significant Events/Discussion: 10/23: HL and aPTT correlating within therapeutic range 10/24: Contacted by neurologist (Dr Lorrin Goodell) to request maintain heparin at low end of goal range (0.3-0.5) due to high bleeding risk 10/24: Heparin off from 1630-2000  for CT, negative for retroperitoneal hemorrhage 10/27: Transitioning from IV UFH to therapeutic enoxaparin (which is also a preferred agent for CVST in the acute phase per neurology note on 10/25).   11/14/2021: HL 0.18, remains subtherapeutic with no improvement despite increasing the rate of heparin infusion to 1750 units/hr CBC: Hgb low but stable; Plt WNL Confirmed with RN that heparin infusing at correct rate. No interruptions/line issues. No signs of bleeding. Bruising was initially marked and is not worsening.  SCr 1.51, CrCl ~43 mL/min. SCr increased but consistent with previous values during hospitalization  Per neurology note on 10/25, "Heparin for acute treatment of CVST. Consider goal between 0.3-0.5 given his coagulopathy which imparts much higher risk of bleeding in general. Heparin or full dose SQ lovenox is the preferred agent for treatment of CVST in the acute phase but after about 5 days of treatment with heparin, he can be transitioned back to PO eliquis '5mg'$  BID."  Heparin level remains subtherapeutic despite numerous rate increases. AT3 low at 58% - discussed with oncology - is possible could be contributing to some heparin resistance and is reasonable to transition to enoxaparin. Discussed with TRH - will transition anticoagulation to therapeutic enoxaparin.  Goal of Therapy:  Heparin level 0.3-0.5 per neurology Anti-Xa level 0.6 - 1 unit/mL obtained 4 hours after enoxaparin  Monitor platelets by anticoagulation protocol: Yes  Plan:  Stop heparin drip at the time of enoxaparin initiation Enoxaparin 1 mg/kg (70 mg) subQ q12h Check anti-Xa level once at steady state Monitor CBC, renal function Follow for ability to resume PO anticoagulation  Lenis Noon, PharmD 11/14/2021 2:30 PM

## 2021-11-14 NOTE — Progress Notes (Signed)
PROGRESS NOTE/consult    Justin [name]  EVO:[id number] DOB: [birth year] DOA: 11/04/2021 PCP: [name], MD    No chief complaint on file.   Brief Narrative:  66 y.o. male with history of essential thrombocythemia, paroxysmal atrial fibrillation on Eliquis, coronary artery disease status post CABG history of functional disability to the left hip secondary to end-stage arthritis, failed outpatient nonsurgical conservative treatment greater than 12 weeks ago was admitted to the orthopedic service for left total hip arthroplasty.    Patient noted to have had significant blood loss in the perioperative period and received 2 units packed transfusions perioperatively on 10/17.  Patient initiated on tranexamic acid by orthopedic surgery, admitted to the hospital with hospitalist group consulted for assistance with suspected pulmonary edema in addition to assistance with management of patient's atrial fibrillation, diabetes, coronary artery disease.    Patient continued to exhibit substantial anemia on 10/18 requiring an additional 2 units of packed red blood cells transfused.  Hospitalization 29/93 was complicated by worsening leukocytosis and the development of encephalopathy.  Initial work-up for encephalopathy was completely negative.  Patient was placed on empiric antibiotic therapy 10/19 which was discontinued on 10/22.  After work-up was found to be negative.  Leukocytosis has progressively worsened throughout the hospitalization and risen to remarkable levels.  Infectious disease consultation on 10/23 was obtained and it was felt the patient was not suffering from an underlying infectious process as the cause.  Due to persisting encephalopathy on 10/23 CT venogram was obtained and found to be highly suggestive of a dural venous sinus thrombosis.  Patient was initiated on a heparin infusion and transferred to the stepdown unit for close clinical monitoring and serial neurologic  checks.   Hospital course also complicated by acute kidney injury of likely multifactorial etiology which progressively improved and resolved  Due to flank bruising on physical exam on 10/24 heparin infusion was temporarily held while CT imaging of the abdomen pelvis was performed.  No retroperitoneal hemorrhage was identified and therefore heparin was resumed.      Assessment & Plan:  Principal Problem:   Dural venous sinus thrombosis Active Problems:   Acute metabolic encephalopathy   AKI (acute kidney injury) ([place])   Acute on chronic systolic CHF (congestive heart failure) (HCC)   Elevated INR   BPH with obstruction/lower urinary tract symptoms   Acute anemia   Leukocytosis   Chronic atrial fibrillation with RVR (HCC)   S/P total left hip arthroplasty   Coronary artery disease involving native heart without angina pectoris   Essential thrombocythemia (HCC)   Mixed diabetic hyperlipidemia associated with type 2 diabetes mellitus (HCC)   Protein-calorie malnutrition, severe (HCC)   Diuretic-induced hypokalemia   Hypomagnesemia   Acute urinary retention   Mixed hyperlipidemia   Acute postoperative anemia due to expected blood loss   Malnutrition of moderate degree    Assessment and Plan: * Dural venous sinus thrombosis CT venogram 10/23 confirmed the presence of a dural venous sinus thrombosis Atypical presentation as patient's primary symptoms have been encephalopathy and urinary retention, not headache Since initiation of heparin on 10/23, patient's encephalopathy has improved since only, but is a ways off from complete resolution Monitoring patient in the stepdown unit for any evidence of seizure activity or bleeding complications EEG with no seizures, moderate encephalopathy, no focal slowing noted. Patient was seen by neurology who was following who are recommending continue treatment for CVST with heparin with goal between 0.3-0.5 due to coagulopathy, heparin on full  dose  subcutaneous Lovenox preferred agent for treatment of CVST in the acute phase but after 5 days of treatment with heparin could be transition back to Eliquis 5 mg twice daily.  Per Neurology recommendations.   Neurology was following but have signed off.  Continuing serial neurologic checks Dr. Marin Olp has initiated a hypercoagulable work-up and has additionally initiated hydroxyurea Neurology, hematology/oncology following and appreciate input and recommendations.  Acute metabolic encephalopathy Secondary to dural venous sinus thrombosis seen on CTV 10/23 as the most likely cause of the patient's encephalopathy, in combination with hospital delirium that comes from prolonged hospitalization. Ambien, Benadryl and opiates were all discontinued earlier in the hospitalization. B12 and folate levels unremarkable. Ammonia obtained on 10/21 unremarkable despite concerns for possible cirrhosis CT head and MRI brain essentially unremarkable TSH slightly elevated likely secondary to euthyroid sick VBG and ABG reveals no evidence of hypercapnia Patient improving clinically, alert and oriented to self place and time.  Following commands appropriately.   EEG with no seizures, moderate encephalopathy, no focal slowing. Neurology was following but have signed off.   AKI (acute kidney injury) ([place]) Renal function improved to baseline. AKI multifactorial Substantial improvement in renal function since placement of a Foley catheter on 10/21 and initiation of Flomax. Patient has a history of BPH with urinary retention in the past per review of old records CT imaging of the abdomen and pelvis revealing circumferential bladder wall thickening concerning for chronic urinary retention That being said, dural venous sinus thrombosis is also been known to cause urinary retention Foley catheter removed however patient failed voiding trial and as such Foley catheter placed back in 11/12/2021.  Urine output of 485  cc over the past 24 hours, patient also on IV Lasix. Slight bump in creatinine.  Blood pressure borderline with systolics in the 22W. IV Lasix discontinued. -Discontinued bethanechol.   Normal saline 250 cc bolus x1. Monitor urine output. Minimizing use of nephrotoxic agents Monitoring renal function and electrolytes with serial chemistry Strict input and output monitoring  Acute on chronic systolic CHF (congestive heart failure) (HCC) Dry mucous membranes and dark urine on exam with concurrent bibasilar rales and diffuse pitting edema consistent with intravascular depletion with anasarca. Elevated on 10/20 at 1749, now down trended to 1137 on 10/23 Patient was aggressively diuresed with intravenous Lasix from 10/20 until morning of 10/23 Patient continues to exhibit significant bilateral pleural effusions with evidence of anasarca on exam and on CT imaging.   However I feel the majority of this volume overload is now due to third spacing from malnutrition/possible cirrhosis and not acute congestive heart failure  echocardiogram revealing slightly decreased ejection fraction of 45 to 50% revealing some mild systolic dysfunction which was confirmed via focused echo on 10/22. Per Dr Darrick Grinder discussion with the wife on 10/21  patient has been experiencing increasing shortness of breath for at least the past month even prior to his hip surgery. Repeat chest x-ray, still with concerns for pulmonary edema and as such patient placed on Lasix 20 mg IV every 12 hours in addition to IV albumin scheduled every 6 hours x24 hours.   Discontinued IV Lasix on 11/13/2021..  Monitor urine output. I's and O's.  Elevated INR Profoundly elevated INR greater than 10 confirmed with repeat testing on 10/21 Substantial improvement in INR with administration of 5 mg of vitamin K on 10/21 and additional '5mg'$  on 10/22.   No clinical evidence of bleeding Etiology unclear but likely multifactorial Clinical picture  not consistent with DIC Right  upper quadrant ultrasound suggestive of cirrhosis on 10/20 Evidence of significant splenomegaly on CT imaging of the abdomen and pelvis 10/24 Dr. Bryan Lemma with gastroenterology was consulted who is not entirely convinced the patient has true cirrhosis.  His input is appreciated.   Furthermore, appreciate Dr. Antonieta Pert input on this apparent coagulopathy   Type 2 diabetes mellitus without complication, without long-term current use of insulin (HCC)-resolved as of 11/06/2021 Patient been placed on Accu-Cheks before every meal and nightly with sliding scale insulin Holding home regimen of hypoglycemics Hemoglobin A1C 5.0% Diabetic Diet   BPH with obstruction/lower urinary tract symptoms Per review of the chart patient has experienced urinary retention secondary to BPH in 2021 prompting temporary placement of a Foley catheter Patient's Flomax was held for several days after surgery and was resumed on 10/21 Patient has a history of BPH with urinary retention in the past That being said, dural venous sinus thrombosis is also been known to cause urinary retention Foley catheter removed on 11/11/2021, patient noted to have failed voiding trial and as such Foley catheter placed back in 11/12/2021.  Discontinued bethanechol due to decreased threshold for seizure.  Will likely need outpatient follow-up with urology and will likely need to be discharged with Foley catheter.    Acute anemia Hemoglobin now stable at 10.5 today. Known history of essential thrombocytopenia on Hydrea No clinical evidence of bleeding Total of 4 units of packed red blood cells transfused during this hospitalization on POD 0 and 1 MCV suggests a macrocytic anemia however vitamin B12 and folate from 08/2021 are unremarkable.  Monitoring hemoglobin and hematocrit with serial CBCs Hematology additionally following, their input is appreciated  Chronic atrial fibrillation with RVR  (HCC) Improved rate control Continue home regimen of metoprolol as blood pressure tolerates Monitoring patient on telemetry Now on heparin therapy for dural venous sinus thrombosis  Leukocytosis Worsening leukocytosis, impressively high Cultures negative thus far Infectious disease consultation obtained 10/23 They believed at the time the patient is not suffering from an infectious process and that no further work-up or empiric antibiotics are necessary. Patient received a 3-day course of intravenous Zosyn from 10/19 until 10/21 which was discontinued due to no evidence of infection Peripheral smear on 10/22 unremarkable Patient placed back on hydroxyurea 10/24 by Dr. Marin Olp for leukocytosis which is currently at 70.1. Appreciate hematology/oncology input and recommendations.  S/P total left hip arthroplasty Status post left hip arthroplasty 10/17 with Dr. Alvan Dame Per orthopedic surgery, patient to be weightbearing as tolerated at this point PT, OT evaluations Limited use of opiate-based analgesics due to ongoing delirium Patient noted with some blotchiness/duskiness left upper thigh however noted to have left leg warm and some tenderness to palpation. Will defer to primary team, orthopedics,??  CT imaging of left thigh?? Per primary team.   Coronary artery disease involving native heart without angina pectoris Continuing metoprolol, statin therapy Patient is currently chest pain-free Monitoring patient on telemetry  Protein-calorie malnutrition, severe (HCC) Poor oral intake with evidence of fat depletion and weight loss consistent with severe protein calorie malnutrition Poor nutritional status is contributing to profound anasarca/third spacing Nutrition following, their input is appreciated Nutritional supplements initiated Multivitamin Patient s/p IV albumin every 6 hours for the next 24 hours.  Mixed diabetic hyperlipidemia associated with type 2 diabetes mellitus  ([place]) Continuing home regimen of lipid lowering therapy.   Essential thrombocythemia ([place]) No significant thrombocytosis at this time Dr. Marin Olp with hematology oncology following, his input is appreciated. Patient initiated on hydroxyurea 10/24.  Diuretic-induced hypokalemia Markedly improved Potassium at 3.9. Repeat labs in AM.  Hypomagnesemia Magnesium repleted and currently at 2.1.   Repeat labs in the AM.    Acute urinary retention - Patient with history of BPH, noted to have acute urinary retention early on in the hospitalization Foley catheter placed. -Voiding trial done which patient failed. -Foley catheter placed back in.   -Continue Flomax, discontinued bethanechol. -Will likely need to be discharged with Foley catheter with outpatient follow-up with urology.         DVT prophylaxis: SCDs Code Status: DNR Family Communication: Updated patient.  No family at bedside. Disposition: Remain in stepdown unit.  Status is: Inpatient Remains inpatient appropriate because: Severity of illness   Consultants:  Neurology: Dr. Leonel Ramsay 11/08/2021 Hematology/oncology Dr. Marin Olp 11/05/2021 Gastroenterology: Dr. Bryan Lemma 11/08/2021 Hospitalist Dr. Grandville Silos 11/04/2021 Infectious disease: Dr.Vu 11/10/2021  Procedures:  EEG 11/12/2021 CT head 11/08/2021 CT abdomen and pelvis 11/11/2021 Chest x-ray 11/05/2021, 11/08/2021, 2023 2023, 11/12/2021 MRI brain 11/08/2021 Renal ultrasound 11/06/2021 Right upper quadrant ultrasound 11/07/2021 2D echo 11/07/2021 Limited echocardiogram 11/09/2021 CT venogram 11/10/2021 4 units PRBCs transfused  Antimicrobials:  Anti-infectives (From admission, onward)    Start     Dose/Rate Route Frequency Ordered Stop   11/06/21 1400  piperacillin-tazobactam (ZOSYN) IVPB 3.375 g  Status:  Discontinued        3.375 g 12.5 mL/hr over 240 Minutes Intravenous Every 8 hours 11/06/21 0722 11/09/21 0842   11/06/21 0815   piperacillin-tazobactam (ZOSYN) IVPB 3.375 g        3.375 g 100 mL/hr over 30 Minutes Intravenous  Once 11/06/21 0720 11/06/21 1548   11/04/21 1800  ceFAZolin (ANCEF) IVPB 2g/100 mL premix        2 g 200 mL/hr over 30 Minutes Intravenous Every 6 hours 11/04/21 1707 11/05/21 0048   11/04/21 0915  ceFAZolin (ANCEF) IVPB 2g/100 mL premix        2 g 200 mL/hr over 30 Minutes Intravenous On call to O.R. 11/04/21 0904 11/04/21 1239         Subjective: Sitting up in bed, currently on room air with sats of 93%.  Denies any chest pain.  Denies any shortness of breath.  Alert oriented to self place and time.  Tolerating oral intake.  Systolic blood pressure in the 90s.   Objective: Vitals:   11/14/21 0800 11/14/21 0851 11/14/21 0856 11/14/21 0859  BP: 110/60     Pulse:  91 99   Resp: (!) 21 (!) 22 (!) 24   Temp:    (!) 97.5 F (36.4 C)  TempSrc:    Oral  SpO2:  99% 95%   Weight:      Height:        Intake/Output Summary (Last 24 hours) at 11/14/2021 0958 Last data filed at 11/14/2021 0800 Gross per 24 hour  Intake 286.45 ml  Output 785 ml  Net -498.55 ml   Filed Weights   11/04/21 0941 11/10/21 1537  Weight: 75.8 kg 68.1 kg    Examination:  General exam: NAD Respiratory system: Some decreased breath sounds in the bases otherwise clear.  No significant crackles noted.  No wheezing.  Fair air movement.  Normal respiratory effort.  Cardiovascular system: Irregularly irregular.  No JVD, no murmurs rubs or gallops.  Trace to 1+ bilateral lower extremity edema.  Gastrointestinal system: Abdomen is soft, nontender, nondistended, positive bowel sounds.  No rebound.  No guarding.   Central nervous system: Alert and oriented. No focal neurological deficits. Extremities:  Left thigh with some blotchiness and some duskiness noted.  Postop dressing noted on left hip.  Left foot warm. Skin: Large ecchymotic area noted in the left flank which has been stable.  Psychiatry: Judgement and  insight appear fair. Mood & affect appropriate.     Data Reviewed:   CBC: Recent Labs  Lab 11/08/21 0401 11/09/21 0347 11/10/21 0346 11/11/21 0305 11/12/21 0257 11/13/21 0047 11/14/21 0412  WBC 45.1* 50.0* 67.4* 87.7* 69.8* 70.7* 70.1*  NEUTROABS 37.9* 35.9* 55.9* 71.9*  --   --   --   HGB 9.9* 9.5* 10.3* 10.9* 10.3* 10.6* 10.5*  HCT 30.8* 29.8* 32.2* 34.8* 32.9* 33.3* 33.3*  MCV 105.1* 105.7* 105.2* 109.4* 110.8* 110.3* 111.4*  PLT 284 274 306 312 261 237 794    Basic Metabolic Panel: Recent Labs  Lab 11/10/21 0346 11/10/21 1720 11/11/21 0305 11/12/21 0257 11/13/21 0047 11/14/21 0412  NA 140 140 142 136 135 133*  K 2.6* 3.0* 3.9 3.5 3.8 3.9  CL 103 103 105 102 102 101  CO2 '25 26 25 25 24 '$ 20*  GLUCOSE 80 91 103* 115* 135* 91  BUN '20 16 20 21 '$ 24* 31*  CREATININE 1.53* 1.33* 1.33* 1.10 1.15 1.51*  CALCIUM 8.1* 8.1* 8.2* 7.7* 8.0* 7.6*  MG 1.8  --  1.8 1.6* 2.3 2.1  PHOS 2.6  --   --   --   --   --     GFR: Estimated Creatinine Clearance: 42.8 mL/min (A) (by C-G formula based on SCr of 1.51 mg/dL (H)).  Liver Function Tests: Recent Labs  Lab 11/08/21 0401 11/09/21 0347 11/10/21 0346 11/11/21 0305 11/12/21 0257  AST 62* 49* 46* 39 28  ALT '15 13 13 12 11  '$ ALKPHOS 91 94 119 136* 118  BILITOT 1.8* 1.9* 2.3* 2.3* 2.1*  PROT 5.1* 5.1* 5.5* 6.0* 5.2*  ALBUMIN 2.5* 2.4* 2.6* 2.7* 2.4*    CBG: Recent Labs  Lab 11/08/21 0745 11/08/21 1240 11/08/21 1715  GLUCAP 75 106* 103*     Recent Results (from the past 240 hour(s))  Culture, blood (Routine X 2) w Reflex to ID Panel     Status: None   Collection Time: 11/06/21 11:36 AM   Specimen: BLOOD LEFT ARM  Result Value Ref Range Status   Specimen Description   Final    BLOOD LEFT ARM Performed at [facility], [place] [address] [place], [place] [address]    Special Requests   Final    BOTTLES DRAWN AEROBIC AND ANAEROBIC Blood Culture adequate volume Performed at [facility] [address], [place], [place] [address]    Culture   Final    NO GROWTH 5 DAYS Performed at [facility], [place] [address], [place], [place] [address]    Report Status 11/11/2021 FINAL  Final  Culture, blood (Routine X 2) w Reflex to ID Panel     Status: None   Collection Time: 11/06/21 11:47 AM   Specimen: BLOOD RIGHT HAND  Result Value Ref Range Status   Specimen Description   Final    BLOOD RIGHT HAND Performed at [facility] [address] [place], [place] [address]    Special Requests   Final    BOTTLES DRAWN AEROBIC AND ANAEROBIC Blood Culture adequate volume Performed at [facility] [address] [place], [place] [address]    Culture   Final    NO GROWTH 5 DAYS Performed at [facility], [place] [address] [place], [place] [address]    Report Status 11/11/2021 FINAL  Final  Urine Culture     Status: None   Collection Time: 11/06/21  3:11 PM   Specimen: In/Out Cath Urine  Result Value Ref Range Status   Specimen Description   Final    IN/OUT CATH URINE Performed at [facility] [address] [place], [place] [address]    Special Requests   Final    Immunocompromised Performed at [facility], [place] [address], [place], [place] [address]    Culture   Final    NO GROWTH Performed at [facility], [place] [address] [place], [place] [address]    Report Status 11/07/2021 FINAL  Final  MRSA Next Gen by PCR, Nasal     Status: None   Collection Time: 11/10/21  3:48 PM   Specimen: Nasal Mucosa; Nasal Swab  Result Value Ref Range Status   MRSA by PCR Next Gen NOT DETECTED NOT DETECTED Final    Comment: (NOTE) The GeneXpert MRSA Assay (FDA approved for NASAL specimens only), is one component of a comprehensive MRSA colonization surveillance program. It is not intended to diagnose MRSA infection nor to guide or monitor treatment for MRSA infections. Test  performance is not FDA approved in patients less than 31 years old. Performed at [facility], [place] [address] [place], [place] [address]          Radiology Studies: EEG adult  Result Date: Nov 21, 2021 Lora Havens, MD     11-21-2021  5:07 PM Patient Name: Clif Serio MRN: [id number] Epilepsy Attending: Lora Havens Referring Physician/Provider: Derek Jack, MD Date: 11-21-2021 Duration: 24.57 mins Patient history: 66 year old male with altered mental status.  EEG to evaluate for seizure. Level of alertness: Awake AEDs during EEG study: None Technical aspects: This EEG study was done with scalp electrodes positioned according to the 10-20 International system of electrode placement. Electrical activity was reviewed with band pass filter of 1-'70Hz'$ , sensitivity of 7 uV/mm, display speed of 54m/sec with a '60Hz'$  notched filter applied as appropriate. EEG data were recorded continuously and digitally stored.  Video monitoring was available and reviewed as appropriate. Description: No clear posterior dominant rhythm was seen. EEG showed continuous generalized 5 to 7 Hz theta slowing. Hyperventilation and photic stimulation were not performed.   ABNORMALITY - Continuous slow, generalized IMPRESSION: This study is suggestive of moderate diffuse encephalopathy, nonspecific etiology. No seizures or epileptiform discharges were seen throughout the recording. Priyanka OBarbra Sarks       Scheduled Meds:  Chlorhexidine Gluconate Cloth  6 each Topical Daily   feeding supplement  237 mL Oral BID BM   hydroxyurea  500 mg Oral q12n4p   loratadine  10 mg Oral Daily   metoprolol tartrate  25 mg Oral BID   multivitamin with minerals  1 tablet Oral Daily   pantoprazole  40 mg Oral BID   polyethylene glycol  17 g Oral Daily   rosuvastatin  20 mg Oral Daily   tamsulosin  0.4 mg Oral Daily   thiamine  100 mg Oral Daily   Continuous Infusions:  sodium chloride Stopped  (11/11/21 1130)   heparin 1,750 Units/hr (11/14/21 0539)   methocarbamol (ROBAXIN) IV     sodium chloride       LOS: 9 days    Time spent: 40 minutes    DIrine Seal MD [facility]  To contact the attending provider between 7A-7P or the covering provider during after hours 7P-7A, please log into the web site [email or web address] and access using universal [place] password for that web site. If you do not have the password, please call the hospital operator.  11/14/2021, 9:58 AM

## 2021-11-15 DIAGNOSIS — G08 Intracranial and intraspinal phlebitis and thrombophlebitis: Secondary | ICD-10-CM | POA: Diagnosis not present

## 2021-11-15 DIAGNOSIS — I5023 Acute on chronic systolic (congestive) heart failure: Secondary | ICD-10-CM | POA: Diagnosis not present

## 2021-11-15 DIAGNOSIS — G9341 Metabolic encephalopathy: Secondary | ICD-10-CM | POA: Diagnosis not present

## 2021-11-15 DIAGNOSIS — D62 Acute posthemorrhagic anemia: Secondary | ICD-10-CM | POA: Diagnosis not present

## 2021-11-15 LAB — ALBUMIN: Albumin: 2.8 g/dL — ABNORMAL LOW (ref 3.5–5.0)

## 2021-11-15 LAB — BASIC METABOLIC PANEL
Anion gap: 8 (ref 5–15)
BUN: 38 mg/dL — ABNORMAL HIGH (ref 8–23)
CO2: 25 mmol/L (ref 22–32)
Calcium: 7.6 mg/dL — ABNORMAL LOW (ref 8.9–10.3)
Chloride: 99 mmol/L (ref 98–111)
Creatinine, Ser: 1.72 mg/dL — ABNORMAL HIGH (ref 0.61–1.24)
GFR, Estimated: 43 mL/min — ABNORMAL LOW (ref >60)
Glucose, Bld: 100 mg/dL — ABNORMAL HIGH (ref 70–99)
Potassium: 3.4 mmol/L — ABNORMAL LOW (ref 3.5–5.1)
Sodium: 132 mmol/L — ABNORMAL LOW (ref 135–145)

## 2021-11-15 LAB — CBC
HCT: 32.1 % — ABNORMAL LOW (ref 39.0–52.0)
Hemoglobin: 10.4 g/dL — ABNORMAL LOW (ref 13.0–17.0)
MCH: 35.3 pg — ABNORMAL HIGH (ref 26.0–34.0)
MCHC: 32.4 g/dL (ref 30.0–36.0)
MCV: 108.8 fL — ABNORMAL HIGH (ref 80.0–100.0)
Platelets: 206 10*3/uL (ref 150–400)
RBC: 2.95 MIL/uL — ABNORMAL LOW (ref 4.22–5.81)
RDW: 25.5 % — ABNORMAL HIGH (ref 11.5–15.5)
WBC: 53.1 10*3/uL (ref 4.0–10.5)
nRBC: 0.7 % — ABNORMAL HIGH (ref 0.0–0.2)

## 2021-11-15 LAB — MAGNESIUM: Magnesium: 1.9 mg/dL (ref 1.7–2.4)

## 2021-11-15 MED ORDER — POTASSIUM CHLORIDE CRYS ER 10 MEQ PO TBCR
20.0000 meq | EXTENDED_RELEASE_TABLET | Freq: Once | ORAL | Status: AC
  Administered 2021-11-15: 20 meq via ORAL
  Filled 2021-11-15: qty 2

## 2021-11-15 MED ORDER — ALBUMIN HUMAN 25 % IV SOLN
25.0000 g | Freq: Once | INTRAVENOUS | Status: AC
  Administered 2021-11-15: 25 g via INTRAVENOUS
  Filled 2021-11-15: qty 100

## 2021-11-15 MED ORDER — MELATONIN 5 MG PO TABS
5.0000 mg | ORAL_TABLET | Freq: Once | ORAL | Status: AC
  Administered 2021-11-15: 5 mg via ORAL
  Filled 2021-11-15: qty 1

## 2021-11-15 MED ORDER — APIXABAN 5 MG PO TABS
5.0000 mg | ORAL_TABLET | Freq: Two times a day (BID) | ORAL | Status: DC
  Administered 2021-11-15 – 2021-11-19 (×8): 5 mg via ORAL
  Filled 2021-11-15 (×8): qty 1

## 2021-11-15 MED ORDER — SODIUM CHLORIDE 0.9 % IV SOLN: INTRAVENOUS | Status: AC

## 2021-11-15 NOTE — Progress Notes (Signed)
PROGRESS NOTE/consult    Justin [name]  NGE:[id number] DOB: 1955-10-12 DOA: 11/04/2021 PCP: [name], MD    No chief complaint on file.   Brief Narrative:  66 y.o. male with history of essential thrombocythemia, paroxysmal atrial fibrillation on Eliquis, coronary artery disease status post CABG history of functional disability to the left hip secondary to end-stage arthritis, failed outpatient nonsurgical conservative treatment greater than 12 weeks ago was admitted to the orthopedic service for left total hip arthroplasty.    Patient noted to have had significant blood loss in the perioperative period and received 2 units packed transfusions perioperatively on 10/17.  Patient initiated on tranexamic acid by orthopedic surgery, admitted to the hospital with hospitalist group consulted for assistance with suspected pulmonary edema in addition to assistance with management of patient's atrial fibrillation, diabetes, coronary artery disease.    Patient continued to exhibit substantial anemia on 10/18 requiring an additional 2 units of packed red blood cells transfused.  Hospitalization 40/10 was complicated by worsening leukocytosis and the development of encephalopathy.  Initial work-up for encephalopathy was completely negative.  Patient was placed on empiric antibiotic therapy 10/19 which was discontinued on 10/22.  After work-up was found to be negative.  Leukocytosis has progressively worsened throughout the hospitalization and risen to remarkable levels.  Infectious disease consultation on 10/23 was obtained and it was felt the patient was not suffering from an underlying infectious process as the cause.  Due to persisting encephalopathy on 10/23 CT venogram was obtained and found to be highly suggestive of a dural venous sinus thrombosis.  Patient was initiated on a heparin infusion and transferred to the stepdown unit for close clinical monitoring and serial neurologic  checks.   Hospital course also complicated by acute kidney injury of likely multifactorial etiology which progressively improved and resolved  Due to flank bruising on physical exam on 10/24 heparin infusion was temporarily held while CT imaging of the abdomen pelvis was performed.  No retroperitoneal hemorrhage was identified and therefore heparin was resumed.      Assessment & Plan:  Principal Problem:   Dural venous sinus thrombosis Active Problems:   Acute metabolic encephalopathy   AKI (acute kidney injury) ([place])   Acute on chronic systolic CHF (congestive heart failure) (HCC)   Elevated INR   BPH with obstruction/lower urinary tract symptoms   Acute anemia   Leukocytosis   Chronic atrial fibrillation with RVR (HCC)   S/P total left hip arthroplasty   Coronary artery disease involving native heart without angina pectoris   Essential thrombocythemia (HCC)   Mixed diabetic hyperlipidemia associated with type 2 diabetes mellitus (HCC)   Protein-calorie malnutrition, severe (HCC)   Diuretic-induced hypokalemia   Hypomagnesemia   Acute urinary retention   Mixed hyperlipidemia   Acute postoperative anemia due to expected blood loss   Malnutrition of moderate degree    Assessment and Plan: * Dural venous sinus thrombosis CT venogram 10/23 confirmed the presence of a dural venous sinus thrombosis Atypical presentation as patient's primary symptoms have been encephalopathy and urinary retention, not headache Since initiation of heparin on 10/23, patient's encephalopathy has improved since only, but is a ways off from complete resolution Monitoring patient in the stepdown unit for any evidence of seizure activity or bleeding complications EEG with no seizures, moderate encephalopathy, no focal slowing noted. Patient was seen by neurology who was following who are recommending continue treatment for CVST with heparin with goal between 0.3-0.5 due to coagulopathy, heparin on full  dose  subcutaneous Lovenox preferred agent for treatment of CVST in the acute phase but after 5 days of treatment with heparin could be transition back to Eliquis 5 mg twice daily.  Per Neurology recommendations.   Neurology was following but have signed off.  Continuing serial neurologic checks Dr. Marin Olp has initiated a hypercoagulable work-up and has additionally initiated hydroxyurea  Due to bump in creatinine will transition to Eliquis. Neurology, hematology/oncology following and appreciate input and recommendations.  Acute metabolic encephalopathy Secondary to dural venous sinus thrombosis seen on CTV 10/23 as the most likely cause of the patient's encephalopathy, in combination with hospital delirium that comes from prolonged hospitalization. Ambien, Benadryl and opiates were all discontinued earlier in the hospitalization. B12 and folate levels unremarkable. Ammonia obtained on 10/21 unremarkable despite concerns for possible cirrhosis CT head and MRI brain essentially unremarkable TSH slightly elevated likely secondary to euthyroid sick VBG and ABG reveals no evidence of hypercapnia Improving clinically daily, alert oriented to self place and time.  Following commands appropriately.  EEG with no seizures, moderate encephalopathy, no focal slowing. Neurology was following but have signed off.  Continue supportive care.  AKI (acute kidney injury) ([place]) Renal function improved to baseline a few days ago however creatinine trending back up.. AKI multifactorial Substantial improvement in renal function since placement of a Foley catheter on 10/21 and initiation of Flomax. Patient has a history of BPH with urinary retention in the past per review of old records CT imaging of the abdomen and pelvis revealing circumferential bladder wall thickening concerning for chronic urinary retention That being said, dural venous sinus thrombosis is also been known to cause urinary retention Foley  catheter removed however patient failed voiding trial and as such Foley catheter placed back in 11/12/2021.  Urine output of 875 cc over the past 24 hours, patient was also on IV Lasix. Creatinine trending back up.   IV Lasix discontinued.   Gentle hydration with normal saline at 50 cc an hour for the next 24 hours.  -Discontinued bethanechol.   Monitor urine output. Minimizing use of nephrotoxic agents Monitoring renal function and electrolytes with serial chemistry Strict input and output monitoring  Acute on chronic systolic CHF (congestive heart failure) (HCC) Dry mucous membranes and dark urine on exam with concurrent bibasilar rales and diffuse pitting edema consistent with intravascular depletion with anasarca. Elevated on 10/20 at 1749, now down trended to 1137 on 10/23 Patient was aggressively diuresed with intravenous Lasix from 10/20 until morning of 10/23 Patient continues to exhibit significant bilateral pleural effusions with evidence of anasarca on exam and on CT imaging.   However I feel the majority of this volume overload is now due to third spacing from malnutrition/possible cirrhosis and not acute congestive heart failure  echocardiogram revealing slightly decreased ejection fraction of 45 to 50% revealing some mild systolic dysfunction which was confirmed via focused echo on 10/22. Per Dr Darrick Grinder discussion with the wife on 10/21  patient has been experiencing increasing shortness of breath for at least the past month even prior to his hip surgery. Repeat chest x-ray, still with concerns for pulmonary edema and as such patient placed on Lasix 20 mg IV every 12 hours in addition to IV albumin scheduled every 6 hours x24 hours.   Discontinued IV Lasix on 11/13/2021. Monitor urine output. I's and O's.  Elevated INR Profoundly elevated INR greater than 10 confirmed with repeat testing on 10/21 Substantial improvement in INR with administration of 5 mg of vitamin K on  10/21 and additional '5mg'$  on 10/22.   No clinical evidence of bleeding Etiology unclear but likely multifactorial Clinical picture not consistent with DIC Right upper quadrant ultrasound suggestive of cirrhosis on 10/20 Evidence of significant splenomegaly on CT imaging of the abdomen and pelvis 10/24 Dr. Bryan Lemma with gastroenterology was consulted who is not entirely convinced the patient has true cirrhosis.  His input is appreciated.   Furthermore, appreciate Dr. Antonieta Pert input on this apparent coagulopathy   Type 2 diabetes mellitus without complication, without long-term current use of insulin (HCC)-resolved as of 11/06/2021 Patient been placed on Accu-Cheks before every meal and nightly with sliding scale insulin Holding home regimen of hypoglycemics Hemoglobin A1C 5.0% Diabetic Diet   BPH with obstruction/lower urinary tract symptoms Per review of the chart patient has experienced urinary retention secondary to BPH in 2021 prompting temporary placement of a Foley catheter Patient's Flomax was held for several days after surgery and was resumed on 10/21 Patient has a history of BPH with urinary retention in the past That being said, dural venous sinus thrombosis is also been known to cause urinary retention Foley catheter removed on 11/11/2021, patient noted to have failed voiding trial and as such Foley catheter placed back in 11/12/2021.  Discontinued bethanechol due to decreased threshold for seizure.  Will likely need outpatient follow-up with urology and will likely need to be discharged with Foley catheter.    Acute anemia Hemoglobin now stable at 10.4 today. Known history of essential thrombocytopenia on Hydrea No clinical evidence of bleeding Total of 4 units of packed red blood cells transfused during this hospitalization on POD 0 and 1 MCV suggests a macrocytic anemia however vitamin B12 and folate from 08/2021 are unremarkable.  Monitoring hemoglobin and hematocrit  with serial CBCs Hematology additionally following, their input is appreciated  Chronic atrial fibrillation with RVR (HCC) Improved rate control Continue home regimen of metoprolol as blood pressure tolerates Monitoring patient on telemetry Was on heparin for dural venous sinus thrombosis, transition to Lovenox and will transition back to home regimen Eliquis today, 11/15/2021.  Leukocytosis Worsening leukocytosis, impressively high during the hospitalization which is trending back down currently at 53.1 from 78.1 yesterday (11/14/2021). Cultures negative thus far Infectious disease consultation obtained 10/23 They believed at the time the patient is not suffering from an infectious process and that no further work-up or empiric antibiotics are necessary. Patient received a 3-day course of intravenous Zosyn from 10/19 until 10/21 which was discontinued due to no evidence of infection Peripheral smear on 10/22 unremarkable Patient placed back on hydroxyurea 10/24 by Dr. Marin Olp for leukocytosis which is currently at 53.1. Appreciate hematology/oncology input and recommendations.  S/P total left hip arthroplasty Status post left hip arthroplasty 10/17 with Dr. Alvan Dame Per orthopedic surgery, patient to be weightbearing as tolerated at this point PT, OT evaluations Limited use of opiate-based analgesics due to ongoing delirium Patient noted with some blotchiness/duskiness left upper thigh however noted to have left leg warm and some tenderness to palpation. Will defer to primary team, orthopedics,??  CT imaging of left thigh?? Per primary team.   Coronary artery disease involving native heart without angina pectoris Continuing metoprolol, statin therapy Patient is currently chest pain-free Monitoring patient on telemetry  Protein-calorie malnutrition, severe (HCC) Poor oral intake with evidence of fat depletion and weight loss consistent with severe protein calorie malnutrition Poor  nutritional status is contributing to profound anasarca/third spacing Nutrition following, their input is appreciated Nutritional supplements initiated Multivitamin Patient s/p IV albumin every  6 hours x 24 hours.  Mixed diabetic hyperlipidemia associated with type 2 diabetes mellitus ([place]) Continuing home regimen of lipid lowering therapy.   Essential thrombocythemia ([place]) No significant thrombocytosis at this time Dr. Marin Olp with hematology oncology following, his input is appreciated. Patient initiated on hydroxyurea 10/24.  Diuretic-induced hypokalemia Improved but trending back down likely related to diuresis.  Potassium at 3.4.   Replete.   Hypomagnesemia Magnesium repleted and currently at 1.9.  Repeat labs in the AM.    Acute urinary retention - Patient with history of BPH, noted to have acute urinary retention early on in the hospitalization Foley catheter placed. -Voiding trial done which patient failed. -Foley catheter placed back in.   -Continue Flomax, discontinued bethanechol. -Will likely need to be discharged with Foley catheter with outpatient follow-up with urology.         DVT prophylaxis: SCDs Code Status: DNR Family Communication: Updated patient.  No family at bedside. Disposition: Remain in stepdown unit.  Hopefully transfer to progressive versus telemetry in the next 24 hours.  Status is: Inpatient Remains inpatient appropriate because: Severity of illness   Consultants:  Neurology: Dr. Leonel Ramsay 11/08/2021 Hematology/oncology Dr. Marin Olp 11/05/2021 Gastroenterology: Dr. Bryan Lemma 11/08/2021 Hospitalist Dr. Grandville Silos 11/04/2021 Infectious disease: Dr.Vu 11/10/2021  Procedures:  EEG 11/12/2021 CT head 11/08/2021 CT abdomen and pelvis 11/11/2021 Chest x-ray 11/05/2021, 11/08/2021, 2023 2023, 11/12/2021 MRI brain 11/08/2021 Renal ultrasound 11/06/2021 Right upper quadrant ultrasound 11/07/2021 2D echo 11/07/2021 Limited  echocardiogram 11/09/2021 CT venogram 11/10/2021 4 units PRBCs transfused  Antimicrobials:  Anti-infectives (From admission, onward)    Start     Dose/Rate Route Frequency Ordered Stop   11/06/21 1400  piperacillin-tazobactam (ZOSYN) IVPB 3.375 g  Status:  Discontinued        3.375 g 12.5 mL/hr over 240 Minutes Intravenous Every 8 hours 11/06/21 0722 11/09/21 0842   11/06/21 0815  piperacillin-tazobactam (ZOSYN) IVPB 3.375 g        3.375 g 100 mL/hr over 30 Minutes Intravenous  Once 11/06/21 0720 11/06/21 1548   11/04/21 1800  ceFAZolin (ANCEF) IVPB 2g/100 mL premix        2 g 200 mL/hr over 30 Minutes Intravenous Every 6 hours 11/04/21 1707 11/05/21 0048   11/04/21 0915  ceFAZolin (ANCEF) IVPB 2g/100 mL premix        2 g 200 mL/hr over 30 Minutes Intravenous On call to O.R. 11/04/21 0904 11/04/21 1239         Subjective: Laying in bed.  Alert and oriented to self place and time.  Tolerating oral intake.  Mentation improving.    Objective: Vitals:   11/15/21 1000 11/15/21 1018 11/15/21 1200 11/15/21 1214  BP: 123/72 123/72 113/74   Pulse: 87 92 82   Resp: (!) 21  (!) 21   Temp:    97.6 F (36.4 C)  TempSrc:    Oral  SpO2: 92%  93%   Weight:      Height:        Intake/Output Summary (Last 24 hours) at 11/15/2021 1412 Last data filed at 11/15/2021 1119 Gross per 24 hour  Intake 527.5 ml  Output 700 ml  Net -172.5 ml   Filed Weights   11/04/21 0941 11/10/21 1537  Weight: 75.8 kg 68.1 kg    Examination:  General exam: NAD. Respiratory system: Decreased breath sounds in the bases otherwise clear.  No significant crackles noted.  No wheezing.  Fair air movement.  Speaking in full sentences. Cardiovascular system: Irregularly irregular.  No  JVD, no murmurs rubs or gallops.  Trace to 1+ bilateral lower extremity edema.  Gastrointestinal system: Abdomen is soft, nontender, nondistended, positive bowel sounds.  No rebound.  No guarding.   Central nervous system:  Alert and oriented. No focal neurological deficits. Extremities: Left thigh with some blotchiness and some duskiness noted.  Postop dressing noted on left hip.  Left foot and thigh warm.  Some tenderness to palpation in the left thigh. Skin: Large ecchymotic area noted in the left flank which has been stable and slowly regressing.  Psychiatry: Judgement and insight appear fair. Mood & affect appropriate.     Data Reviewed:   CBC: Recent Labs  Lab 11/09/21 0347 11/10/21 0346 11/11/21 0305 11/12/21 0257 11/13/21 0047 11/14/21 0412 11/15/21 0316  WBC 50.0* 67.4* 87.7* 69.8* 70.7* 70.1* 53.1*  NEUTROABS 35.9* 55.9* 71.9*  --   --   --   --   HGB 9.5* 10.3* 10.9* 10.3* 10.6* 10.5* 10.4*  HCT 29.8* 32.2* 34.8* 32.9* 33.3* 33.3* 32.1*  MCV 105.7* 105.2* 109.4* 110.8* 110.3* 111.4* 108.8*  PLT 274 306 312 261 237 190 774    Basic Metabolic Panel: Recent Labs  Lab 11/10/21 0346 11/10/21 1720 11/11/21 0305 11/12/21 0257 11/13/21 0047 11/14/21 0412 11/15/21 0316  NA 140   < > 142 136 135 133* 132*  K 2.6*   < > 3.9 3.5 3.8 3.9 3.4*  CL 103   < > 105 102 102 101 99  CO2 25   < > '25 25 24 '$ 20* 25  GLUCOSE 80   < > 103* 115* 135* 91 100*  BUN 20   < > 20 21 24* 31* 38*  CREATININE 1.53*   < > 1.33* 1.10 1.15 1.51* 1.72*  CALCIUM 8.1*   < > 8.2* 7.7* 8.0* 7.6* 7.6*  MG 1.8  --  1.8 1.6* 2.3 2.1 1.9  PHOS 2.6  --   --   --   --   --   --    < > = values in this interval not displayed.    GFR: Estimated Creatinine Clearance: 37.6 mL/min (A) (by C-G formula based on SCr of 1.72 mg/dL (H)).  Liver Function Tests: Recent Labs  Lab 11/09/21 0347 11/10/21 0346 11/11/21 0305 11/12/21 0257 11/15/21 0316  AST 49* 46* 39 28  --   ALT '13 13 12 11  '$ --   ALKPHOS 94 119 136* 118  --   BILITOT 1.9* 2.3* 2.3* 2.1*  --   PROT 5.1* 5.5* 6.0* 5.2*  --   ALBUMIN 2.4* 2.6* 2.7* 2.4* 2.8*    CBG: Recent Labs  Lab 11/08/21 1715  GLUCAP 103*     Recent Results (from the past 240  hour(s))  Culture, blood (Routine X 2) w Reflex to ID Panel     Status: None   Collection Time: 11/06/21 11:36 AM   Specimen: BLOOD LEFT ARM  Result Value Ref Range Status   Specimen Description   Final    BLOOD LEFT ARM Performed at [facility], [place] [address], [place], [place] [address]    Special Requests   Final    BOTTLES DRAWN AEROBIC AND ANAEROBIC Blood Culture adequate volume Performed at [facility] [address], [place], [place] [address]    Culture   Final    NO GROWTH 5 DAYS Performed at [facility], [place] [address], [place], [place] [address]    Report Status 11/11/2021 FINAL  Final  Culture, blood (Routine X 2) w Reflex to ID Panel     Status: None   Collection Time: 11/06/21 11:47 AM   Specimen: BLOOD RIGHT HAND  Result Value Ref Range Status   Specimen Description   Final    BLOOD RIGHT HAND Performed at [place] [place] [address], [place], [place] [address]    Special Requests   Final    BOTTLES DRAWN AEROBIC AND ANAEROBIC Blood Culture adequate volume Performed at [facility] [address] [place], [place] [address]    Culture   Final    NO GROWTH 5 DAYS Performed at [facility], [place] [address], [place], [place] [address]    Report Status 11/11/2021 FINAL  Final  Urine Culture     Status: None   Collection Time: 11/06/21  3:11 PM   Specimen: In/Out Cath Urine  Result Value Ref Range Status   Specimen Description   Final    IN/OUT CATH URINE Performed at [place] [address], [place], [place] [address]    Special Requests   Final    Immunocompromised Performed at [facility], [place] [address], [place], [place] [address]    Culture   Final    NO GROWTH Performed at [facility], [place] [address], [place], [place] [address]    Report Status 11/07/2021 FINAL  Final  MRSA Next Gen by PCR, Nasal     Status:  None   Collection Time: 11/10/21  3:48 PM   Specimen: Nasal Mucosa; Nasal Swab  Result Value Ref Range Status   MRSA by PCR Next Gen NOT DETECTED NOT DETECTED Final    Comment: (NOTE) The GeneXpert MRSA Assay (FDA approved for NASAL specimens only), is one component of a comprehensive MRSA colonization surveillance program. It is not intended to diagnose MRSA infection nor to guide or monitor treatment for MRSA infections. Test performance is not FDA approved in patients less than 90 years old. Performed at [facility], [place] [address], [place], [place] [address]          Radiology Studies: No results found.      Scheduled Meds:  apixaban  5 mg Oral BID   Chlorhexidine Gluconate Cloth  6 each Topical Daily   feeding supplement  237 mL Oral BID BM   hydroxyurea  500 mg Oral q12n4p   loratadine  10 mg Oral Daily   metoprolol tartrate  25 mg Oral BID   multivitamin with minerals  1 tablet Oral Daily   pantoprazole  40 mg Oral BID   polyethylene glycol  17 g Oral Daily   rosuvastatin  20 mg Oral Daily   tamsulosin  0.4 mg Oral Daily   thiamine  100 mg Oral Daily   Continuous Infusions:  sodium chloride Stopped (11/11/21 1130)   sodium chloride 50 mL/hr at 11/15/21 1018   methocarbamol (ROBAXIN) IV       LOS: 10 days    Time spent: 40 minutes    Irine Seal, MD [facility]   To contact the attending provider between 7A-7P or the covering provider during after hours 7P-7A, please log into the web site [email or web address] and access using universal [place] password for that web site. If you do not have the password, please call the hospital operator.  11/15/2021, 2:12 PM

## 2021-11-15 NOTE — Progress Notes (Addendum)
Subjective: 11 Days Post-Op Procedure(s) (LRB): TOTAL HIP ARTHROPLASTY ANTERIOR APPROACH (Left) Patient reports pain as mild.   Pt feels pain continues to improve. Does not feel as though his left thigh appearance has changed in the last day.  Objective: Vital signs in last 24 hours: Temp:  [96.2 F (35.7 C)-98 F (36.7 C)] 96.2 F (35.7 C) (10/28 0752) Pulse Rate:  [73-108] 93 (10/28 0800) Resp:  [17-32] 20 (10/28 0800) BP: (107-135)/(51-84) 126/63 (10/28 0800) SpO2:  [90 %-98 %] 98 % (10/28 0800) FiO2 (%):  [1 %] 1 % (10/27 2140)  Intake/Output from previous day: 10/27 0701 - 10/28 0700 In: 1028.3 [P.O.:680; I.V.:123.7; IV Piggyback:224.6] Out: 875 [Urine:875] Intake/Output this shift: Total I/O In: 47.5 [I.V.:47.5] Out: -   Recent Labs    11/13/21 0047 11/14/21 0412 11/15/21 0316  HGB 10.6* 10.5* 10.4*   Recent Labs    11/14/21 0412 11/15/21 0316  WBC 70.1* 53.1*  RBC 2.99* 2.95*  HCT 33.3* 32.1*  PLT 190 206   Recent Labs    11/14/21 0412 11/15/21 0316  NA 133* 132*  K 3.9 3.4*  CL 101 99  CO2 20* 25  BUN 31* 38*  CREATININE 1.51* 1.72*  GLUCOSE 91 100*  CALCIUM 7.6* 7.6*   No results for input(s): "LABPT", "INR" in the last 72 hours.  Neurologically intact ABD soft Neurovascular intact Sensation intact distally Intact pulses distally Dorsiflexion/Plantar flexion intact Incision: dressing C/D/I and no drainage No cellulitis present Compartment soft No sign of LLE DVT   Assessment/Plan: 11 Days Post-Op Procedure(s) (LRB): TOTAL HIP ARTHROPLASTY ANTERIOR APPROACH (Left) Advance diet Up with therapy Continue working with PT DVT ppx per medicine Continue to keep an eye on blotchiness of L thigh, seems unchanged since yesterday. If worsens could require further imaging.  Justin [name] 11/15/2021, 9:52 AM

## 2021-11-15 NOTE — Progress Notes (Signed)
Physical Therapy Treatment Patient Details Name: Justin [name] MRN: [id number] DOB: [birth year] Today's Date: 11/15/2021   History of Present Illness 66 y.o. male s/p Left THA on 11/04/21.  PMHx: A.Fib on Eliquis, CAD s/p CABG, AS s/p AVR with bioprosthetic, s/p CEA, DM2, HTN, essential thrombocytosis/JAK2+ chronic leukocytosis, course complicated by  dural venous sinus thrombosis post op.    PT Comments    POD # 11 L THR + post Op Dural Venous Sinus Thrombosis Pt seen in Step Down ICU RM # [id number] Assisted OOB to amb in hallway required + 2 asisst.  General bed mobility comments: increased time as well as use of bed pad to complete scooting to EOB. Assist for upper body.  General transfer comment: Required + 2 side by side assist to rise from elevated bed with 50% VC's on proper hand placement to push off bed.  75% VC's on proper hand placement "reaching back" to recliner prior to sit. General Gait Details: tolerated an increased distance 32 feet using B Platform EVA walker + 2 asisst such that recliner was following. Returned to room then performed a few TE's.  Vitals: Supine              BP 123/72  HR 94  RA 94  RR 28 EOB                 BP  127/58  HR 94  RA 94  RR 23 Standing           BP 117/73  HR 100 RA 93  RR 31 3 min amb        BP  105/56  HR 89  RA 97  RR 25    mild c/o dizziness  Pt plans to D/C to Inpt Rehab pending approval.    Recommendations for follow up therapy are one component of a multi-disciplinary discharge planning process, led by the attending physician.  Recommendations may be updated based on patient status, additional functional criteria and insurance authorization.  Follow Up Recommendations  Acute inpatient rehab (3hours/day)     Assistance Recommended at Discharge Frequent or constant Supervision/Assistance  Patient can return home with the following Help with stairs or ramp for entrance;A lot of help with walking and/or transfers;A little help with  bathing/dressing/bathroom;Assistance with cooking/housework;Assist for transportation   Equipment Recommendations  Other (comment) (EVA walker)    Recommendations for Other Services Rehab consult     Precautions / Restrictions Precautions Precautions: Fall Precaution Comments: monitor vitals Restrictions Weight Bearing Restrictions: No Other Position/Activity Restrictions: WBAT     Mobility  Bed Mobility Overal bed mobility: Needs Assistance Bed Mobility: Supine to Sit     Supine to sit: Mod assist, HOB elevated     General bed mobility comments: increased time as well as use of bed pad to complete scooting to EOB.    Transfers Overall transfer level: Needs assistance Equipment used: Bilateral platform walker Transfers: Sit to/from Stand Sit to Stand: Mod assist, +2 safety/equipment, +2 physical assistance           General transfer comment: Required + 2 side by side assist to rise from elevated bed with 50% VC's on proper hand placement to push off bed.  75% VC's on proper hand placement "reaching back" to recliner prior to sit.    Ambulation/Gait Ambulation/Gait assistance: Mod assist, Max assist, +2 physical assistance, +2 safety/equipment Gait Distance (Feet): 32 Feet Assistive device: Bilateral platform walker, Ethelene Hal Gait Pattern/deviations: Step-to pattern, Decreased  stance time - left, Antalgic Gait velocity: decreased     General Gait Details: tolerated an increased distance 32 feet using B Platform EVA walker + 2 asisst such that recliner was following.   Stairs             Wheelchair Mobility    Modified Rankin (Stroke Patients Only)       Balance                                            Cognition Arousal/Alertness: Awake/alert Behavior During Therapy: WFL for tasks assessed/performed, Flat affect Overall Cognitive Status: Within Functional Limits for tasks assessed                                  General Comments: AxO x 3 improving alertness/less groggy.  Retired Catering manager.  Feeling "better".        Exercises  Total Hip Replacement TE's following HEP Handout 10 reps ankle pumps 05 reps knee presses 05 reps heel slides 05 reps SAQ's 05 reps ABD Instructed how to use a belt loop to assist  Followed by ICE     General Comments        Pertinent Vitals/Pain Pain Assessment Pain Assessment: 0-10 Pain Score: 2  Pain Location: "stiff" Pain Descriptors / Indicators: Tightness Pain Intervention(s): Monitored during session, Repositioned    Home Living                          Prior Function            PT Goals (current goals can now be found in the care plan section) Progress towards PT goals: Progressing toward goals    Frequency    7X/week      PT Plan Current plan remains appropriate    Co-evaluation              AM-PAC PT "6 Clicks" Mobility   Outcome Measure  Help needed turning from your back to your side while in a flat bed without using bedrails?: A Lot Help needed moving from lying on your back to sitting on the side of a flat bed without using bedrails?: A Lot Help needed moving to and from a bed to a chair (including a wheelchair)?: A Lot Help needed standing up from a chair using your arms (e.g., wheelchair or bedside chair)?: A Lot Help needed to walk in hospital room?: A Lot   6 Click Score: 10    End of Session Equipment Utilized During Treatment: Gait belt Activity Tolerance: Patient limited by fatigue Patient left: in chair;with call bell/phone within reach;with chair alarm set Nurse Communication: Mobility status PT Visit Diagnosis: Other abnormalities of gait and mobility (R26.89)     Time: 2876-8115 PT Time Calculation (min) (ACUTE ONLY): 25 min  Charges:  $Gait Training: 8-22 mins $Therapeutic Exercise: 8-22 mins                     [name]  PTA [place] [facility]  M-F          [phone] Weekend pager [phone]

## 2021-11-15 NOTE — Progress Notes (Signed)
He does seem to be better neurologically.  He seem to be more alert.  He does start a new I was.  He knew where he was.  He knew about the surgery a week ago.  He currently is on Lovenox.  Hopefully, he will be able to go on an oral agent.  The coagulation studies certainly are perplexing.  He has a very low AT3 level.  He also has a very low Protein C and Protein S levels.  I cannot imagine that he has all these hypercoagulable issues.  I probably would have to repeat these down the road a little bit.  His white cell count has come down.  Today is 53.1.  Hemoglobin 10.4.  Platelet count 206,000.  He says he is eating a little bit better.  He has had no obvious nausea or vomiting.  There is been no fever.  He has had no obvious bleeding.  There is no headache.  His vital signs show temperature of 98.  Pulse 88.  Blood pressure 123/65.  His head and neck exam are unremarkable.  He has no scleral icterus.  His lungs are clear bilaterally.  He has good air movement bilaterally.  Cardiac exam is tachycardic but irregular.  This is consistent with his atrial fibrillation.  Abdomen is soft.  Bowel sounds are active.  Extremities shows the healing left hip surgical scar.  Neurological exam shows no obvious neurological deficits.  Finally, I think that Justin [name] is making neurological progress.  He seems to be getting back to his baseline.  He is not there yet but at least making progress.  Again I am not sure how to explain the coagulation studies.  I will repeat these in a couple months.  It sounds like he might go to rehab.  I do not know if he would be a candidate for CIR.  I do appreciate the incredible care that he is getting from everybody in the ICU.  Hopefully, he will be able to be moved out soon.  Lattie Haw, MD  Psalm 91:1-2

## 2021-11-15 NOTE — Plan of Care (Signed)
11/15/2021  Problem: Education: Goal: Knowledge of the prescribed therapeutic regimen will improve Outcome: Progressing Goal: Understanding of discharge needs will improve Outcome: Progressing   Problem: Activity: Goal: Ability to avoid complications of mobility impairment will improve Outcome: Progressing Goal: Ability to tolerate increased activity will improve Outcome: Progressing   Problem: Clinical Measurements: Goal: Postoperative complications will be avoided or minimized Outcome: Progressing   Problem: Pain Management: Goal: Pain level will decrease with appropriate interventions Outcome: Progressing   Problem: Skin Integrity: Goal: Will show signs of wound healing Outcome: Progressing   Problem: Education: Goal: Knowledge of General Education information will improve Description: Including pain rating scale, medication(s)/side effects and non-pharmacologic comfort measures Outcome: Progressing   Problem: Health Behavior/Discharge Planning: Goal: Ability to manage health-related needs will improve Outcome: Progressing   Problem: Clinical Measurements: Goal: Ability to maintain clinical measurements within normal limits will improve Outcome: Progressing Goal: Will remain free from infection Outcome: Progressing Goal: Diagnostic test results will improve Outcome: Progressing Goal: Respiratory complications will improve Outcome: Progressing Goal: Cardiovascular complication will be avoided Outcome: Progressing   Problem: Activity: Goal: Risk for activity intolerance will decrease Outcome: Progressing   Problem: Nutrition: Goal: Adequate nutrition will be maintained Outcome: Progressing   Problem: Coping: Goal: Level of anxiety will decrease Outcome: Progressing   Problem: Elimination: Goal: Will not experience complications related to bowel motility Outcome: Progressing Goal: Will not experience complications related to urinary retention Outcome:  Progressing   Problem: Pain Managment: Goal: General experience of comfort will improve Outcome: Progressing   Problem: Safety: Goal: Ability to remain free from injury will improve Outcome: Progressing   Problem: Skin Integrity: Goal: Risk for impaired skin integrity will decrease Outcome: Progressing   Cindy S. Brigitte Pulse BSN, RN, [place] 11/15/2021 2:43 AM

## 2021-11-15 NOTE — TOC Progression Note (Signed)
Transition of Care [facility]) - Progression Note    Patient Details  Name: Justin [name] MRN: [id number] Date of Birth: 1955/06/30  Transition of Care [facility]) CM/SW Contact  Ross Ludwig, [place] Phone Number: 11/15/2021, 6:08 PM  Clinical Narrative:     TOC continuing to follow patient's progress throughout discharge planning.  CIR following patient's progress.    Barriers to Discharge: No Barriers Identified  Expected Discharge Plan and Services                           DME Arranged: N/A DME Agency: NA                   Social Determinants of Health (SDOH) Interventions    Readmission Risk Interventions    11/06/2021    9:35 AM  Readmission Risk Prevention Plan  Transportation Screening Complete  PCP or Specialist Appt within 3-5 Days Complete  HRI or [place] Complete  Social Work Consult for [place] Planning/Counseling Complete  Palliative Care Screening Not Applicable  Medication Review Press photographer) Complete

## 2021-11-16 DIAGNOSIS — I5023 Acute on chronic systolic (congestive) heart failure: Secondary | ICD-10-CM | POA: Diagnosis not present

## 2021-11-16 DIAGNOSIS — G9341 Metabolic encephalopathy: Secondary | ICD-10-CM | POA: Diagnosis not present

## 2021-11-16 DIAGNOSIS — D62 Acute posthemorrhagic anemia: Secondary | ICD-10-CM | POA: Diagnosis not present

## 2021-11-16 DIAGNOSIS — G08 Intracranial and intraspinal phlebitis and thrombophlebitis: Secondary | ICD-10-CM | POA: Diagnosis not present

## 2021-11-16 LAB — RENAL FUNCTION PANEL
Albumin: 3 g/dL — ABNORMAL LOW (ref 3.5–5.0)
Anion gap: 8 (ref 5–15)
BUN: 39 mg/dL — ABNORMAL HIGH (ref 8–23)
CO2: 23 mmol/L (ref 22–32)
Calcium: 7.6 mg/dL — ABNORMAL LOW (ref 8.9–10.3)
Chloride: 100 mmol/L (ref 98–111)
Creatinine, Ser: 1.45 mg/dL — ABNORMAL HIGH (ref 0.61–1.24)
GFR, Estimated: 53 mL/min — ABNORMAL LOW (ref >60)
Glucose, Bld: 84 mg/dL (ref 70–99)
Phosphorus: 4 mg/dL (ref 2.5–4.6)
Potassium: 3.5 mmol/L (ref 3.5–5.1)
Sodium: 131 mmol/L — ABNORMAL LOW (ref 135–145)

## 2021-11-16 LAB — CBC
HCT: 30.6 % — ABNORMAL LOW (ref 39.0–52.0)
Hemoglobin: 9.8 g/dL — ABNORMAL LOW (ref 13.0–17.0)
MCH: 35.5 pg — ABNORMAL HIGH (ref 26.0–34.0)
MCHC: 32 g/dL (ref 30.0–36.0)
MCV: 110.9 fL — ABNORMAL HIGH (ref 80.0–100.0)
Platelets: 191 10*3/uL (ref 150–400)
RBC: 2.76 MIL/uL — ABNORMAL LOW (ref 4.22–5.81)
RDW: 25.7 % — ABNORMAL HIGH (ref 11.5–15.5)
WBC: 30.8 10*3/uL — ABNORMAL HIGH (ref 4.0–10.5)
nRBC: 1.2 % — ABNORMAL HIGH (ref 0.0–0.2)

## 2021-11-16 MED ORDER — POTASSIUM CHLORIDE CRYS ER 20 MEQ PO TBCR
20.0000 meq | EXTENDED_RELEASE_TABLET | Freq: Once | ORAL | Status: AC
  Administered 2021-11-16: 20 meq via ORAL
  Filled 2021-11-16: qty 1

## 2021-11-16 NOTE — Evaluation (Signed)
Occupational Therapy Evaluation Patient Details Name: Justin [name] MRN: [id number] DOB: Nov 08, 1955 Today's Date: 11/16/2021   History of Present Illness 66 y.o. male s/p Left THA on 11/04/21.  PMHx: A.Fib on Eliquis, CAD s/p CABG, AS s/p AVR with bioprosthetic, s/p CEA, DM2, HTN, essential thrombocytosis/JAK2+ chronic leukocytosis, course complicated by  dural venous sinus thrombosis post op.   Clinical Impression   Pt is typically independent in ADL and mobility - after experiencing several set back post-THA he is currently max A for LB ADL, mod A for bed mobility, mod A +2 to min A +2 for transfers, decreased activity tolerance requiring supplemental O2 to maintain SpO2 >90% with activity. He will benefit from skilled OT in the acute setting as well as afterwards at the AIR level to maximize safety and independence in ADL and functional transfers. Plan to alternate PT/OT in the future and next session for OT should focus on AE education in addition to progressing past EVA walker to RW for in room mobility and transfers for functional ADL.      Recommendations for follow up therapy are one component of a multi-disciplinary discharge planning process, led by the attending physician.  Recommendations may be updated based on patient status, additional functional criteria and insurance authorization.   Follow Up Recommendations  Acute inpatient rehab (3hours/day)    Assistance Recommended at Discharge Frequent or constant Supervision/Assistance  Patient can return home with the following A lot of help with walking and/or transfers;A lot of help with bathing/dressing/bathroom;Assistance with cooking/housework;Direct supervision/assist for medications management;Direct supervision/assist for financial management;Assist for transportation;Help with stairs or ramp for entrance    Functional Status Assessment  Patient has had a recent decline in their functional status and demonstrates the  ability to make significant improvements in function in a reasonable and predictable amount of time.  Equipment Recommendations  BSC/3in1;Other (comment) (defer to next venue of care)    Recommendations for Other Services PT consult     Precautions / Restrictions Precautions Precautions: Fall Precaution Comments: monitor vitals Restrictions Weight Bearing Restrictions: Yes LLE Weight Bearing: Weight bearing as tolerated Other Position/Activity Restrictions: WBAT      Mobility Bed Mobility Overal bed mobility: Needs Assistance Bed Mobility: Supine to Sit     Supine to sit: Mod assist, HOB elevated     General bed mobility comments: increased time as well as use of bed pad to complete scooting to EOB. utilizing therapist in lieu of bed rail for trunk elevation    Transfers Overall transfer level: Needs assistance Equipment used: Bilateral platform walker (EVA walker) Transfers: Sit to/from Stand Sit to Stand: Mod assist, +2 safety/equipment, +2 physical assistance, Min assist     Step pivot transfers: Mod assist, +2 safety/equipment, +2 physical assistance     General transfer comment: vc each time for safe hand placement, initially for [facility] transfer using RW and mod A +2 then for standing EVA walker, min A +2      Balance Overall balance assessment: Needs assistance Sitting-balance support: Bilateral upper extremity supported, Feet supported Sitting balance-Leahy Scale: Fair     Standing balance support: Bilateral upper extremity supported, Reliant on assistive device for balance, During functional activity Standing balance-Leahy Scale: Poor Standing balance comment: reliant on RW and therapist                           ADL either performed or assessed with clinical judgement   ADL Overall ADL's : Needs  assistance/impaired Eating/Feeding: Set up;Sitting   Grooming: Wash/dry face;Wash/dry hands;Set up;Sitting   Upper Body Bathing: Moderate  assistance Upper Body Bathing Details (indicate cue type and reason): for back Lower Body Bathing: Maximal assistance;Sitting/lateral leans   Upper Body Dressing : Minimal assistance Upper Body Dressing Details (indicate cue type and reason): to don second gown like robe Lower Body Dressing: Maximal assistance;Sitting/lateral leans   Toilet Transfer: Moderate assistance;+2 for safety/equipment;Stand-pivot;BSC/3in1;Rolling walker (2 wheels) Toilet Transfer Details (indicate cue type and reason): urgency Toileting- Clothing Manipulation and Hygiene: Maximal assistance;+2 for safety/equipment;Sit to/from stand Toileting - Clothing Manipulation Details (indicate cue type and reason): able to maintain standing for extensive rear peri care/cleaning     Functional mobility during ADLs: Minimal assistance;+2 for physical assistance;+2 for safety/equipment (EVA walker) General ADL Comments: decreased activity tolerance DOE, decreased balance, decreased cognition     Vision Ability to See in Adequate Light: 0 Adequate Patient Visual Report: No change from baseline       Perception     Praxis      Pertinent Vitals/Pain Pain Assessment Pain Assessment: Faces Faces Pain Scale: Hurts little more Pain Location: "stiff", L hip Pain Descriptors / Indicators: Tightness, Sore Pain Intervention(s): Monitored during session, Repositioned     Hand Dominance Right   Extremity/Trunk Assessment Upper Extremity Assessment Upper Extremity Assessment: Overall WFL for tasks assessed   Lower Extremity Assessment Lower Extremity Assessment: Defer to PT evaluation LLE Deficits / Details: anticipated post op hip weakness observed   Cervical / Trunk Assessment Cervical / Trunk Assessment: Kyphotic   Communication Communication Communication: No difficulties   Cognition Arousal/Alertness: Awake/alert Behavior During Therapy: WFL for tasks assessed/performed, Flat affect Overall Cognitive Status:  Within Functional Limits for tasks assessed Area of Impairment: Attention, Awareness, Memory                   Current Attention Level: Sustained Memory: Decreased short-term memory     Awareness: Emergent   General Comments: Pt is tangential and does not always answer questions appropriately (HOH vs cognition?) Retired Catering manager. vc for safe hand placement with EVERY transfer     General Comments       Exercises     Shoulder Instructions      Home Living Family/patient expects to be discharged to:: Private residence Living Arrangements: Spouse/significant other Available Help at Discharge: Family Type of Home: House Home Access: Stairs to enter Technical brewer of Steps: 1 Entrance Stairs-Rails: None Home Layout: One level     Bathroom Shower/Tub: Occupational psychologist: Standard     Home Equipment: None   Additional Comments: lived internationally (fluent in Psychologist, prison and probation services - lived in [place], [place], [place]), air flight attendant as an adult  Lives With: Spouse    Prior Functioning/Environment Prior Level of Function : Independent/Modified Independent                        OT Problem List: Decreased strength;Decreased range of motion;Decreased activity tolerance;Impaired balance (sitting and/or standing);Decreased cognition;Decreased safety awareness;Decreased knowledge of use of DME or AE;Decreased knowledge of precautions;Pain      OT Treatment/Interventions: Self-care/ADL training;Therapeutic exercise;Energy conservation;DME and/or AE instruction;Therapeutic activities;Cognitive remediation/compensation;Patient/family education;Balance training    OT Goals(Current goals can be found in the care plan section) Acute Rehab OT Goals Patient Stated Goal: get to inpatient rehab to max independence OT Goal Formulation: With patient Time For Goal Achievement: 11/30/21 Potential to Achieve Goals: Good ADL Goals Pt Will  Perform Grooming: with supervision;standing Pt Will Perform Upper Body Dressing: with modified independence;sitting Pt Will Perform Lower Body Dressing: with set-up;with adaptive equipment;sit to/from stand Pt Will Transfer to Toilet: with supervision;ambulating Pt Will Perform Toileting - Clothing Manipulation and hygiene: with min guard assist;sitting/lateral leans  OT Frequency: Min 2X/week    Co-evaluation PT/OT/SLP Co-Evaluation/Treatment: Yes Reason for Co-Treatment: For patient/therapist safety;To address functional/ADL transfers PT goals addressed during session: Mobility/safety with mobility;Balance;Proper use of DME;Strengthening/ROM OT goals addressed during session: ADL's and self-care;Strengthening/ROM;Proper use of Adaptive equipment and DME      AM-PAC OT "6 Clicks" Daily Activity     Outcome Measure Help from another person eating meals?: None Help from another person taking care of personal grooming?: A Little Help from another person toileting, which includes using toliet, bedpan, or urinal?: A Lot Help from another person bathing (including washing, rinsing, drying)?: A Lot Help from another person to put on and taking off regular upper body clothing?: A Little Help from another person to put on and taking off regular lower body clothing?: A Lot 6 Click Score: 16   End of Session Equipment Utilized During Treatment: Gait belt;Rolling walker (2 wheels);Other (comment) (EVA walker) Nurse Communication: Mobility status;Other (comment) (Pt provided with gingerale)  Activity Tolerance: Patient tolerated treatment well Patient left: in chair;with call bell/phone within reach;with chair alarm set  OT Visit Diagnosis: Unsteadiness on feet (R26.81);Other abnormalities of gait and mobility (R26.89);Muscle weakness (generalized) (M62.81);Other symptoms and signs involving cognitive function;Pain Pain - Right/Left: Left Pain - part of body: Hip                Time:  1151-1230 OT Time Calculation (min): 39 min Charges:  OT General Charges $OT Visit: 1 Visit OT Evaluation $OT Eval Moderate Complexity: [place] OTR/L Acute Rehabilitation Services Office: [place] 11/16/2021, 2:07 PM

## 2021-11-16 NOTE — Progress Notes (Signed)
Physical Therapy Treatment Patient Details Name: Justin [name] MRN: [id number] DOB: Feb 26, 1955 Today's Date: 11/16/2021   History of Present Illness 66 y.o. male s/p Left THA on 11/04/21.  PMHx: A.Fib on Eliquis, CAD s/p CABG, AS s/p AVR with bioprosthetic, s/p CEA, DM2, HTN, essential thrombocytosis/JAK2+ chronic leukocytosis, course complicated by  dural venous sinus thrombosis post op.    PT Comments    Pt continues very motivated and progressing steadily with mobility.  This date pt up to [facility] and to standing for assist with hygiene before ambulating increased distance in hall.  Endurance building but pt continues to fatigue fairly easily.  Pt up in chair for lunch at session end.   Recommendations for follow up therapy are one component of a multi-disciplinary discharge planning process, led by the attending physician.  Recommendations may be updated based on patient status, additional functional criteria and insurance authorization.  Follow Up Recommendations  Acute inpatient rehab (3hours/day)     Assistance Recommended at Discharge Frequent or constant Supervision/Assistance  Patient can return home with the following Help with stairs or ramp for entrance;A lot of help with walking and/or transfers;A little help with bathing/dressing/bathroom;Assistance with cooking/housework;Assist for transportation   Equipment Recommendations  Other (comment)    Recommendations for Other Services Rehab consult     Precautions / Restrictions Precautions Precautions: Fall Precaution Comments: monitor vitals Restrictions Weight Bearing Restrictions: No LLE Weight Bearing: Weight bearing as tolerated Other Position/Activity Restrictions: WBAT     Mobility  Bed Mobility Overal bed mobility: Needs Assistance Bed Mobility: Supine to Sit     Supine to sit: Mod assist, HOB elevated     General bed mobility comments: increased time as well as use of bed pad to complete scooting to  EOB. utilizing therapist in lieu of bed rail for trunk elevation    Transfers Overall transfer level: Needs assistance Equipment used: Bilateral platform walker, Rolling walker (2 wheels) Transfers: Sit to/from Stand, Bed to chair/wheelchair/BSC Sit to Stand: Mod assist, +2 safety/equipment, +2 physical assistance, Min assist   Step pivot transfers: Mod assist, +2 safety/equipment, +2 physical assistance       General transfer comment: vc each time for safe hand placement, initially for [facility] transfer using RW and mod A +2 then for standing from [facility] to  EVA walker, min A +2    Ambulation/Gait Ambulation/Gait assistance: Mod assist, +2 safety/equipment, +2 physical assistance Gait Distance (Feet): 51 Feet Assistive device: Bilateral platform walker, Ethelene Hal Gait Pattern/deviations: Step-to pattern, Decreased stance time - left, Antalgic Gait velocity: decreased     General Gait Details: tolerated an increased distance 51 feet using B Platform EVA walker + 2 assist such that recliner was following.   Stairs             Wheelchair Mobility    Modified Rankin (Stroke Patients Only)       Balance Overall balance assessment: Needs assistance Sitting-balance support: Bilateral upper extremity supported, Feet supported Sitting balance-Leahy Scale: Fair     Standing balance support: Bilateral upper extremity supported, Reliant on assistive device for balance, During functional activity Standing balance-Leahy Scale: Poor Standing balance comment: reliant on RW and therapist                            Cognition Arousal/Alertness: Awake/alert Behavior During Therapy: WFL for tasks assessed/performed, Flat affect Overall Cognitive Status: Within Functional Limits for tasks assessed Area of Impairment: Attention, Awareness, Memory  Current Attention Level: Sustained Memory: Decreased short-term memory     Awareness: Emergent    General Comments: Pt is tangential and does not always answer questions appropriately (HOH vs cognition?) Retired Catering manager. vc for safe hand placement with EVERY transfer        Exercises      General Comments        Pertinent Vitals/Pain Pain Assessment Pain Assessment: Faces Faces Pain Scale: Hurts little more Pain Location: "stiff", L hip Pain Descriptors / Indicators: Tightness, Sore Pain Intervention(s): Limited activity within patient's tolerance, Monitored during session    Home Living Family/patient expects to be discharged to:: Private residence Living Arrangements: Spouse/significant other Available Help at Discharge: Family Type of Home: House Home Access: Stairs to enter Entrance Stairs-Rails: None Technical brewer of Steps: 1   Home Layout: One level Home Equipment: None Additional Comments: lived internationally (fluent in Psychologist, prison and probation services - lived in [place], [place], [place]), air flight attendant as an adult    Prior Function            PT Goals (current goals can now be found in the care plan section) Acute Rehab PT Goals Patient Stated Goal: to get oob PT Goal Formulation: With patient Time For Goal Achievement: 11/26/21 Potential to Achieve Goals: Fair Progress towards PT goals: Progressing toward goals    Frequency    7X/week      PT Plan Current plan remains appropriate    Co-evaluation PT/OT/SLP Co-Evaluation/Treatment: Yes Reason for Co-Treatment: For patient/therapist safety PT goals addressed during session: Mobility/safety with mobility OT goals addressed during session: ADL's and self-care      AM-PAC PT "6 Clicks" Mobility   Outcome Measure  Help needed turning from your back to your side while in a flat bed without using bedrails?: A Lot Help needed moving from lying on your back to sitting on the side of a flat bed without using bedrails?: A Lot Help needed moving to and from a bed to a chair (including a  wheelchair)?: A Lot Help needed standing up from a chair using your arms (e.g., wheelchair or bedside chair)?: A Lot Help needed to walk in hospital room?: A Lot Help needed climbing 3-5 steps with a railing? : Total 6 Click Score: 11    End of Session Equipment Utilized During Treatment: Gait belt Activity Tolerance: Patient limited by fatigue Patient left: in chair;with call bell/phone within reach;with chair alarm set Nurse Communication: Mobility status PT Visit Diagnosis: Other abnormalities of gait and mobility (R26.89)     Time: 0932-6712 PT Time Calculation (min) (ACUTE ONLY): 46 min  Charges:  $Gait Training: 8-22 mins $Therapeutic Activity: 8-22 mins                     [place] Pager [phone] Office [phone]    Emmalena Canny 11/16/2021, 3:01 PM

## 2021-11-16 NOTE — Progress Notes (Signed)
PROGRESS NOTE/consult    Justin [name]  HWE:[id number] DOB: 1955-10-01 DOA: 11/04/2021 PCP: [name], MD    No chief complaint on file.   Brief Narrative:  66 y.o. male with history of essential thrombocythemia, paroxysmal atrial fibrillation on Eliquis, coronary artery disease status post CABG history of functional disability to the left hip secondary to end-stage arthritis, failed outpatient nonsurgical conservative treatment greater than 12 weeks ago was admitted to the orthopedic service for left total hip arthroplasty.    Patient noted to have had significant blood loss in the perioperative period and received 2 units packed transfusions perioperatively on 10/17.  Patient initiated on tranexamic acid by orthopedic surgery, admitted to the hospital with hospitalist group consulted for assistance with suspected pulmonary edema in addition to assistance with management of patient's atrial fibrillation, diabetes, coronary artery disease.    Patient continued to exhibit substantial anemia on 10/18 requiring an additional 2 units of packed red blood cells transfused.  Hospitalization 89/38 was complicated by worsening leukocytosis and the development of encephalopathy.  Initial work-up for encephalopathy was completely negative.  Patient was placed on empiric antibiotic therapy 10/19 which was discontinued on 10/22.  After work-up was found to be negative.  Leukocytosis has progressively worsened throughout the hospitalization and risen to remarkable levels.  Infectious disease consultation on 10/23 was obtained and it was felt the patient was not suffering from an underlying infectious process as the cause.  Due to persisting encephalopathy on 10/23 CT venogram was obtained and found to be highly suggestive of a dural venous sinus thrombosis.  Patient was initiated on a heparin infusion and transferred to the stepdown unit for close clinical monitoring and serial neurologic  checks.   Hospital course also complicated by acute kidney injury of likely multifactorial etiology which progressively improved and resolved  Due to flank bruising on physical exam on 10/24 heparin infusion was temporarily held while CT imaging of the abdomen pelvis was performed.  No retroperitoneal hemorrhage was identified and therefore heparin was resumed.      Assessment & Plan:  Principal Problem:   Dural venous sinus thrombosis Active Problems:   Acute metabolic encephalopathy   AKI (acute kidney injury) ([place])   Acute on chronic systolic CHF (congestive heart failure) (HCC)   Elevated INR   BPH with obstruction/lower urinary tract symptoms   Acute anemia   Leukocytosis   Chronic atrial fibrillation with RVR (HCC)   S/P total left hip arthroplasty   Coronary artery disease involving native heart without angina pectoris   Essential thrombocythemia (HCC)   Mixed diabetic hyperlipidemia associated with type 2 diabetes mellitus (HCC)   Protein-calorie malnutrition, severe (HCC)   Diuretic-induced hypokalemia   Hypomagnesemia   Acute urinary retention   Mixed hyperlipidemia   Acute postoperative anemia due to expected blood loss   Malnutrition of moderate degree    Assessment and Plan: * Dural venous sinus thrombosis CT venogram 10/23 confirmed the presence of a dural venous sinus thrombosis Atypical presentation as patient's primary symptoms have been encephalopathy and urinary retention, not headache Since initiation of heparin on 10/23, patient's encephalopathy has improved since only, but is a ways off from complete resolution Monitoring patient in the stepdown unit for any evidence of seizure activity or bleeding complications EEG with no seizures, moderate encephalopathy, no focal slowing noted. Patient was seen by neurology who was following who are recommended continue treatment for CVST with heparin with goal between 0.3-0.5 due to coagulopathy, heparin on full  dose  subcutaneous Lovenox preferred agent for treatment of CVST in the acute phase but after 5 days of treatment with heparin could be transition back to Eliquis 5 mg twice daily.  Per Neurology recommendations.   Improved clinically. Neurology was following but have signed off.  Continuing serial neurologic checks Dr. Marin Olp has initiated a hypercoagulable work-up and has additionally initiated hydroxyurea  Due to bump in creatinine patient transition to Eliquis yesterday which she seems to be tolerating.   Neurology, hematology/oncology following and appreciate input and recommendations.  Acute metabolic encephalopathy Secondary to dural venous sinus thrombosis seen on CTV 10/23 as the most likely cause of the patient's encephalopathy, in combination with hospital delirium that comes from prolonged hospitalization. Clinical improvement. Ambien, Benadryl and opiates were all discontinued earlier in the hospitalization. B12 and folate levels unremarkable. Ammonia obtained on 10/21 unremarkable despite concerns for possible cirrhosis CT head and MRI brain essentially unremarkable TSH slightly elevated likely secondary to euthyroid sick VBG and ABG reveals no evidence of hypercapnia Improving clinically daily, alert oriented to self place and time. Following commands appropriately.  EEG with no seizures, moderate encephalopathy, no focal slowing. Neurology was following but have signed off.  Continue supportive care.  AKI (acute kidney injury) ([place]) Renal function improved to baseline a few days ago however creatinine trending back up.. AKI multifactorial Substantial improvement in renal function since placement of a Foley catheter on 10/21 and initiation of Flomax. Patient has a history of BPH with urinary retention in the past per review of old records CT imaging of the abdomen and pelvis revealing circumferential bladder wall thickening concerning for chronic urinary retention That  being said, dural venous sinus thrombosis is also been known to cause urinary retention Foley catheter removed however patient failed voiding trial and as such Foley catheter placed back in 11/12/2021.  Urine output of 550 cc over the past 24 hours, patient was also on IV Lasix. Creatinine was trending back up, patient gently hydrated with IV fluids at 50 cc an hour for 24 hours with creatinine trending back down currently at 1.45.  T IV Lasix discontinued.   -Discontinued bethanechol.   Monitor urine output. Minimizing use of nephrotoxic agents Monitoring renal function and electrolytes with serial chemistry Strict input and output monitoring  Acute on chronic systolic CHF (congestive heart failure) (HCC) Dry mucous membranes and dark urine on exam with concurrent bibasilar rales and diffuse pitting edema consistent with intravascular depletion with anasarca. Elevated on 10/20 at 1749, now down trended to 1137 on 10/23 Patient was aggressively diuresed with intravenous Lasix from 10/20 until morning of 10/23 Patient continues to exhibit significant bilateral pleural effusions with evidence of anasarca on exam and on CT imaging.   However I feel the majority of this volume overload is now due to third spacing from malnutrition/possible cirrhosis and not acute congestive heart failure  echocardiogram revealing slightly decreased ejection fraction of 45 to 50% revealing some mild systolic dysfunction which was confirmed via focused echo on 10/22. Per Dr Darrick Grinder discussion with the wife on 10/21  patient has been experiencing increasing shortness of breath for at least the past month even prior to his hip surgery. Repeat chest x-ray, still with concerns for pulmonary edema and as such patient placed on Lasix 20 mg IV every 12 hours in addition to IV albumin scheduled every 6 hours x24 hours.   Discontinued IV Lasix on 11/13/2021. Improving clinically, currently on 1.5 L of nasal cannula  O2. Monitor urine output. I's and  O's.  Elevated INR Profoundly elevated INR greater than 10 confirmed with repeat testing on 10/21 Substantial improvement in INR with administration of 5 mg of vitamin K on 10/21 and additional '5mg'$  on 10/22.   No clinical evidence of bleeding Etiology unclear but likely multifactorial Clinical picture not consistent with DIC Right upper quadrant ultrasound suggestive of cirrhosis on 10/20 Evidence of significant splenomegaly on CT imaging of the abdomen and pelvis 10/24 Dr. Bryan Lemma with gastroenterology was consulted who is not entirely convinced the patient has true cirrhosis.  His input is appreciated.   Furthermore, appreciate Dr. Antonieta Pert input on this apparent coagulopathy   Type 2 diabetes mellitus without complication, without long-term current use of insulin (HCC)-resolved as of 11/06/2021 Patient been placed on Accu-Cheks before every meal and nightly with sliding scale insulin Holding home regimen of hypoglycemics Hemoglobin A1C 5.0% Diabetic Diet   BPH with obstruction/lower urinary tract symptoms Per review of the chart patient has experienced urinary retention secondary to BPH in 2021 prompting temporary placement of a Foley catheter Patient's Flomax was held for several days after surgery and was resumed on 10/21 Patient has a history of BPH with urinary retention in the past That being said, dural venous sinus thrombosis is also been known to cause urinary retention Foley catheter removed on 11/11/2021, patient noted to have failed voiding trial and as such Foley catheter placed back in 11/12/2021.  Discontinued bethanechol due to decreased threshold for seizure.  Will likely need outpatient follow-up with urology and will likely need to be discharged with Foley catheter.    Acute anemia Hemoglobin now stable at 9.8 today. Known history of essential thrombocytopenia on Hydrea No clinical evidence of bleeding Total of 4 units of  packed red blood cells transfused during this hospitalization on POD 0 and 1 MCV suggests a macrocytic anemia however vitamin B12 and folate from 08/2021 are unremarkable.  Monitoring hemoglobin and hematocrit with serial CBCs Hematology additionally following, their input is appreciated  Chronic atrial fibrillation with RVR (HCC) Improved rate control Continue home regimen of metoprolol as blood pressure tolerates Monitoring patient on telemetry Was on heparin for dural venous sinus thrombosis, transitioned to Lovenox and subsequently transitioned to Eliquis (11/15/2021).  Leukocytosis Worsening leukocytosis, impressively high during the hospitalization which is trending back down currently at 30.8 from 53.1 from 78.1 (11/14/2021). Cultures negative thus far Infectious disease consultation obtained 10/23 They believed at the time the patient is not suffering from an infectious process and that no further work-up or empiric antibiotics are necessary. Patient received a 3-day course of intravenous Zosyn from 10/19 until 10/21 which was discontinued due to no evidence of infection Peripheral smear on 10/22 unremarkable Patient placed back on hydroxyurea 10/24 by Dr. Marin Olp for leukocytosis which is currently at 53.1. Appreciate hematology/oncology input and recommendations.  S/P total left hip arthroplasty Status post left hip arthroplasty 10/17 with Dr. Alvan Dame Per orthopedic surgery, patient to be weightbearing as tolerated at this point PT, OT evaluations Limited use of opiate-based analgesics due to ongoing delirium Patient noted with some blotchiness/duskiness left upper thigh however noted to have left leg warm and some tenderness to palpation. Will defer to primary team, orthopedics,??  CT imaging of left thigh?? Per primary team.   Coronary artery disease involving native heart without angina pectoris Continuing metoprolol, statin therapy Patient is currently chest  pain-free Monitoring patient on telemetry  Protein-calorie malnutrition, severe ([place]) Poor oral intake with evidence of fat depletion and weight loss consistent with severe protein  calorie malnutrition Poor nutritional status is contributing to profound anasarca/third spacing Nutrition following, their input is appreciated Nutritional supplements initiated Multivitamin Patient s/p IV albumin every 6 hours x 24 hours.  Mixed diabetic hyperlipidemia associated with type 2 diabetes mellitus ([place]) Continuing home regimen of lipid lowering therapy.   Essential thrombocythemia ([place]) No significant thrombocytosis at this time Dr. Marin Olp with hematology oncology following, his input is appreciated. Patient initiated on hydroxyurea 10/24.  Diuretic-induced hypokalemia Improved but trending back down likely related to diuresis.  Potassium at 3.5.   Repeat labs in the AM.  Hypomagnesemia Magnesium repleted and currently at 1.9.  Repeat labs in the AM.    Acute urinary retention - Patient with history of BPH, noted to have acute urinary retention early on in the hospitalization Foley catheter placed. -Voiding trial done which patient failed. -Foley catheter placed back in.   -Continue Flomax, discontinued bethanechol. -Will likely need to be discharged with Foley catheter with outpatient follow-up with urology.         DVT prophylaxis: Eliquis Code Status: DNR Family Communication: Updated patient.  No family at bedside. Disposition: Transfer to progressive.  Status is: Inpatient Remains inpatient appropriate because: Severity of illness   Consultants:  Neurology: Dr. Leonel Ramsay 11/08/2021 Hematology/oncology Dr. Marin Olp 11/05/2021 Gastroenterology: Dr. Bryan Lemma 11/08/2021 Hospitalist Dr. Grandville Silos 11/04/2021 Infectious disease: Dr.Vu 11/10/2021  Procedures:  EEG 11/12/2021 CT head 11/08/2021 CT abdomen and pelvis 11/11/2021 Chest x-ray 11/05/2021, 11/08/2021, 2023  2023, 11/12/2021 MRI brain 11/08/2021 Renal ultrasound 11/06/2021 Right upper quadrant ultrasound 11/07/2021 2D echo 11/07/2021 Limited echocardiogram 11/09/2021 CT venogram 11/10/2021 4 units PRBCs transfused  Antimicrobials:  Anti-infectives (From admission, onward)    Start     Dose/Rate Route Frequency Ordered Stop   11/06/21 1400  piperacillin-tazobactam (ZOSYN) IVPB 3.375 g  Status:  Discontinued        3.375 g 12.5 mL/hr over 240 Minutes Intravenous Every 8 hours 11/06/21 0722 11/09/21 0842   11/06/21 0815  piperacillin-tazobactam (ZOSYN) IVPB 3.375 g        3.375 g 100 mL/hr over 30 Minutes Intravenous  Once 11/06/21 0720 11/06/21 1548   11/04/21 1800  ceFAZolin (ANCEF) IVPB 2g/100 mL premix        2 g 200 mL/hr over 30 Minutes Intravenous Every 6 hours 11/04/21 1707 11/05/21 0048   11/04/21 0915  ceFAZolin (ANCEF) IVPB 2g/100 mL premix        2 g 200 mL/hr over 30 Minutes Intravenous On call to O.R. 11/04/21 0904 11/04/21 1239         Subjective: Sitting up in bed, getting ready to brush his dentures.  Denies any chest pain.  No shortness of breath.  No abdominal pain.  Alert and oriented to self place and time.  Knows who the president is.  States somebody is bringing him some food today.     Objective: Vitals:   11/16/21 0500 11/16/21 0600 11/16/21 0800 11/16/21 0814  BP:  131/69 133/78   Pulse: 94 93 86   Resp: (!) 22 (!) 22 20   Temp:    97.9 F (36.6 C)  TempSrc:    Oral  SpO2: 97% 97% 96%   Weight:      Height:        Intake/Output Summary (Last 24 hours) at 11/16/2021 1007 Last data filed at 11/16/2021 0905 Gross per 24 hour  Intake 1163.37 ml  Output 550 ml  Net 613.37 ml   Filed Weights   11/04/21 0941 11/10/21 1537  Weight: 75.8 kg 68.1 kg    Examination:  General exam: NAD. Respiratory system: Some decreased breath sounds in the bases.  No crackles.  No wheezing.  Fair air movement.  Speaking in full sentences.  Cardiovascular  system: Irregularly irregular.  No JVD.  No murmurs rubs or gallops.  1+ bilateral lower extremity edema.   Gastrointestinal system: Abdomen is soft, nontender, nondistended, positive bowel sounds.  No rebound.  No guarding.  Central nervous system: Alert and oriented. No focal neurological deficits. Extremities: Left thigh with some blotchiness and some duskiness noted with some slight improvement over the past 24 hours..  Postop dressing noted on left hip.  Left foot and thigh warm.  Some tenderness to palpation in the left thigh. Skin: Large ecchymotic area noted in the left flank which has been stable and slowly regressing.  Psychiatry: Judgement and insight appear fair. Mood & affect appropriate.     Data Reviewed:   CBC: Recent Labs  Lab 11/10/21 0346 11/11/21 0305 11/12/21 0257 11/13/21 0047 11/14/21 0412 11/15/21 0316 11/16/21 0245  WBC 67.4* 87.7* 69.8* 70.7* 70.1* 53.1* 30.8*  NEUTROABS 55.9* 71.9*  --   --   --   --   --   HGB 10.3* 10.9* 10.3* 10.6* 10.5* 10.4* 9.8*  HCT 32.2* 34.8* 32.9* 33.3* 33.3* 32.1* 30.6*  MCV 105.2* 109.4* 110.8* 110.3* 111.4* 108.8* 110.9*  PLT 306 312 261 237 190 206 284    Basic Metabolic Panel: Recent Labs  Lab 11/10/21 0346 11/10/21 1720 11/11/21 0305 11/12/21 0257 11/13/21 0047 11/14/21 0412 11/15/21 0316 11/16/21 0245  NA 140   < > 142 136 135 133* 132* 131*  K 2.6*   < > 3.9 3.5 3.8 3.9 3.4* 3.5  CL 103   < > 105 102 102 101 99 100  CO2 25   < > '25 25 24 '$ 20* 25 23  GLUCOSE 80   < > 103* 115* 135* 91 100* 84  BUN 20   < > 20 21 24* 31* 38* 39*  CREATININE 1.53*   < > 1.33* 1.10 1.15 1.51* 1.72* 1.45*  CALCIUM 8.1*   < > 8.2* 7.7* 8.0* 7.6* 7.6* 7.6*  MG 1.8  --  1.8 1.6* 2.3 2.1 1.9  --   PHOS 2.6  --   --   --   --   --   --  4.0   < > = values in this interval not displayed.    GFR: Estimated Creatinine Clearance: 44.6 mL/min (A) (by C-G formula based on SCr of 1.45 mg/dL (H)).  Liver Function Tests: Recent Labs   Lab 11/10/21 0346 11/11/21 0305 11/12/21 0257 11/15/21 0316 11/16/21 0245  AST 46* 39 28  --   --   ALT '13 12 11  '$ --   --   ALKPHOS 119 136* 118  --   --   BILITOT 2.3* 2.3* 2.1*  --   --   PROT 5.5* 6.0* 5.2*  --   --   ALBUMIN 2.6* 2.7* 2.4* 2.8* 3.0*    CBG: No results for input(s): "GLUCAP" in the last 168 hours.    Recent Results (from the past 240 hour(s))  Culture, blood (Routine X 2) w Reflex to ID Panel     Status: None   Collection Time: 11/06/21 11:36 AM   Specimen: BLOOD LEFT ARM  Result Value Ref Range Status   Specimen Description   Final    BLOOD LEFT ARM Performed at [facility]  [place] [address], [place], [place] [address]    Special Requests   Final    BOTTLES DRAWN AEROBIC AND ANAEROBIC Blood Culture adequate volume Performed at [place] [address] [place], [place] [address]    Culture   Final    NO GROWTH 5 DAYS Performed at [facility], [place] [address], [place], [place] [address]    Report Status 11/11/2021 FINAL  Final  Culture, blood (Routine X 2) w Reflex to ID Panel     Status: None   Collection Time: 11/06/21 11:47 AM   Specimen: BLOOD RIGHT HAND  Result Value Ref Range Status   Specimen Description   Final    BLOOD RIGHT HAND Performed at [facility] [address] [place], [place] [address]    Special Requests   Final    BOTTLES DRAWN AEROBIC AND ANAEROBIC Blood Culture adequate volume Performed at [facility] [address], [place], [place] [address]    Culture   Final    NO GROWTH 5 DAYS Performed at [facility], [place] [address], [place], [place] [address]    Report Status 11/11/2021 FINAL  Final  Urine Culture     Status: None   Collection Time: 11/06/21  3:11 PM   Specimen: In/Out Cath Urine  Result Value Ref Range Status   Specimen Description   Final    IN/OUT CATH URINE Performed at [place] [facility] [address] [place], [place] [address]    Special Requests   Final    Immunocompromised Performed at [facility], [place] [address], [place], [place] [address]    Culture   Final    NO GROWTH Performed at [facility], [place] [address] [place], [place] [address]    Report Status 11/07/2021 FINAL  Final  MRSA Next Gen by PCR, Nasal     Status: None   Collection Time: 11/10/21  3:48 PM   Specimen: Nasal Mucosa; Nasal Swab  Result Value Ref Range Status   MRSA by PCR Next Gen NOT DETECTED NOT DETECTED Final    Comment: (NOTE) The GeneXpert MRSA Assay (FDA approved for NASAL specimens only), is one component of a comprehensive MRSA colonization surveillance program. It is not intended to diagnose MRSA infection nor to guide or monitor treatment for MRSA infections. Test performance is not FDA approved in patients less than 53 years old. Performed at [facility], [place] [address], [place], [place] [address]          Radiology Studies: No results found.      Scheduled Meds:  apixaban  5 mg Oral BID   Chlorhexidine Gluconate Cloth  6 each Topical Daily   feeding supplement  237 mL Oral BID BM   hydroxyurea  500 mg Oral q12n4p   loratadine  10 mg Oral Daily   metoprolol tartrate  25 mg Oral BID   multivitamin with minerals  1 tablet Oral Daily   pantoprazole  40 mg Oral BID   polyethylene glycol  17 g Oral Daily   potassium chloride  20 mEq Oral Once   rosuvastatin  20 mg Oral Daily   tamsulosin  0.4 mg Oral Daily   thiamine  100 mg Oral Daily   Continuous Infusions:  sodium chloride Stopped (11/11/21 1130)   methocarbamol (ROBAXIN) IV       LOS: 11 days    Time  spent: 40 minutes    Irine Seal, MD [facility]   To contact the attending provider between 7A-7P or the covering provider during after hours 7P-7A, please log into the web site [email or web address] and access using universal [place] password for  that web site. If you do not have the password, please call the hospital operator.  11/16/2021, 10:07 AM

## 2021-11-16 NOTE — Progress Notes (Signed)
Pt stable at time of transfer. 4E RN given report. Belongings collected. Family notified of transfer.

## 2021-11-16 NOTE — Progress Notes (Signed)
Orthopedics Progress Note  Subjective: Patient feeling better. No hip pain  Objective:  Vitals:   11/16/21 1200 11/16/21 1213  BP: 109/88   Pulse: 84   Resp: (!) 21   Temp:  98 F (36.7 C)  SpO2: 94%     General: Awake and alert  Musculoskeletal: No thigh swelling, compartments supple in the left LE. No erythema Neurovascularly intact  Lab Results  Component Value Date   WBC 30.8 (H) 11/16/2021   HGB 9.8 (L) 11/16/2021   HCT 30.6 (L) 11/16/2021   MCV 110.9 (H) 11/16/2021   PLT 191 11/16/2021       Component Value Date/Time   NA 131 (L) 11/16/2021 0245   NA 134 08/14/2021 1329   K 3.5 11/16/2021 0245   CL 100 11/16/2021 0245   CO2 23 11/16/2021 0245   GLUCOSE 84 11/16/2021 0245   BUN 39 (H) 11/16/2021 0245   BUN 15 08/14/2021 1329   CREATININE 1.45 (H) 11/16/2021 0245   CREATININE 1.27 (H) 11/03/2021 1000   CALCIUM 7.6 (L) 11/16/2021 0245   GFRNONAA 53 (L) 11/16/2021 0245   GFRNONAA >60 11/03/2021 1000   GFRAA >60 10/12/2019 0805   GFRAA >60 07/11/2019 2683    Lab Results  Component Value Date   INR 1.9 (H) 11/11/2021   INR 1.9 (H) 11/10/2021   INR 3.3 (H) 11/09/2021    Assessment/Plan: POD #12 s/p Procedure(s): TOTAL HIP ARTHROPLASTY ANTERIOR APPROACH Stable from ortho standpoint. Continues to recover from encephalopathy. WBC down today 30K from high around 70K Continue supportive care, mobilization and DVT prophylaxis - eliquis and mechanical  Doran Heater. Veverly Fells, MD 11/16/2021 1:50 PM

## 2021-11-17 DIAGNOSIS — I5023 Acute on chronic systolic (congestive) heart failure: Secondary | ICD-10-CM | POA: Diagnosis not present

## 2021-11-17 DIAGNOSIS — D62 Acute posthemorrhagic anemia: Secondary | ICD-10-CM | POA: Diagnosis not present

## 2021-11-17 DIAGNOSIS — G9341 Metabolic encephalopathy: Secondary | ICD-10-CM | POA: Diagnosis not present

## 2021-11-17 DIAGNOSIS — G08 Intracranial and intraspinal phlebitis and thrombophlebitis: Secondary | ICD-10-CM | POA: Diagnosis not present

## 2021-11-17 LAB — RENAL FUNCTION PANEL
Albumin: 3 g/dL — ABNORMAL LOW (ref 3.5–5.0)
Anion gap: 8 (ref 5–15)
BUN: 37 mg/dL — ABNORMAL HIGH (ref 8–23)
CO2: 23 mmol/L (ref 22–32)
Calcium: 8 mg/dL — ABNORMAL LOW (ref 8.9–10.3)
Chloride: 100 mmol/L (ref 98–111)
Creatinine, Ser: 1.33 mg/dL — ABNORMAL HIGH (ref 0.61–1.24)
GFR, Estimated: 59 mL/min — ABNORMAL LOW (ref >60)
Glucose, Bld: 106 mg/dL — ABNORMAL HIGH (ref 70–99)
Phosphorus: 4.1 mg/dL (ref 2.5–4.6)
Potassium: 3.8 mmol/L (ref 3.5–5.1)
Sodium: 131 mmol/L — ABNORMAL LOW (ref 135–145)

## 2021-11-17 LAB — CBC
HCT: 32.4 % — ABNORMAL LOW (ref 39.0–52.0)
Hemoglobin: 10.4 g/dL — ABNORMAL LOW (ref 13.0–17.0)
MCH: 35.1 pg — ABNORMAL HIGH (ref 26.0–34.0)
MCHC: 32.1 g/dL (ref 30.0–36.0)
MCV: 109.5 fL — ABNORMAL HIGH (ref 80.0–100.0)
Platelets: 205 10*3/uL (ref 150–400)
RBC: 2.96 MIL/uL — ABNORMAL LOW (ref 4.22–5.81)
RDW: 25.8 % — ABNORMAL HIGH (ref 11.5–15.5)
WBC: 24.7 10*3/uL — ABNORMAL HIGH (ref 4.0–10.5)
nRBC: 1.3 % — ABNORMAL HIGH (ref 0.0–0.2)

## 2021-11-17 LAB — MAGNESIUM: Magnesium: 1.8 mg/dL (ref 1.7–2.4)

## 2021-11-17 MED ORDER — ZINC OXIDE 40 % EX OINT
TOPICAL_OINTMENT | CUTANEOUS | Status: DC | PRN
  Filled 2021-11-17: qty 57

## 2021-11-17 MED ORDER — OXYCODONE HCL 5 MG PO TABS
5.0000 mg | ORAL_TABLET | ORAL | Status: DC | PRN
  Administered 2021-11-17 – 2021-11-19 (×5): 5 mg via ORAL
  Filled 2021-11-17 (×5): qty 1

## 2021-11-17 MED ORDER — HYDROXYUREA 500 MG PO CAPS
500.0000 mg | ORAL_CAPSULE | Freq: Every day | ORAL | Status: DC
  Administered 2021-11-17 – 2021-11-18 (×2): 500 mg via ORAL
  Filled 2021-11-17 (×2): qty 1

## 2021-11-17 NOTE — Progress Notes (Signed)
Unstageable Left heel decubitus approximately 3 cm in diameter and abrasion/wound to dorsum of left foot assessed at bath this morning. Will pass onto day RN to notify primary team today.

## 2021-11-17 NOTE — Consult Note (Signed)
WOC Nurse Consult Note: Reason for Consult: left heel pressure injury Wound type: Unstageable Pressure Injury Pressure Injury [id number] self reported heel injury prior to admission, verified that he had a dark area prior to coming to the hospital. He was trying to offload at home as well.  Measurement: 2cm x 3cm x 0cm  Wound bed:100% dark purple; non blanchable  Drainage (amount, consistency, odor) none Periwound:intact  Dressing procedure/placement/frequency: Add Prevalon boot for offloading at all times; explained rationale for this with patient Add painting the area with betadine daily and allowing to air dry to promote this area staying stable and dry.     [facility] Nurse team will follow with you and see patient within 10 days for wound assessments.  Please notify [facility] nurses of any acute changes in the wounds or any new areas of concern [place] MSN, [place], [place], [place]

## 2021-11-17 NOTE — Care Management Important Message (Signed)
Important Message  Patient Details IM Letter given to the Patient Name: Justin [name] MRN: [id number] Date of Birth: 09-12-1955   Medicare Important Message Given:  Yes     Kerin Salen 11/17/2021, 12:45 PM

## 2021-11-17 NOTE — Progress Notes (Signed)
[name] is now out of the ICU.  He is up on 4 E.  He is looking pretty good.  According to the nurse, he had a little bit of blood per rectum.  She says this is a tiny bit.  I realize he is on blood thinner.  This might be part of the bleeding issue.  His white cell count has come down very nicely.  His white cell count is 24.7.  Hemoglobin 10.4.  Platelet count 205,000.  I will decrease the hydroxyurea to 500 mg p.o. daily dose.  He does seem to be alert.  He does seem to be oriented.  I would say that is very close to his baseline mental status.  I am still not sure if he is going to rehab.  I would like to hope that he would be going to rehab.  He is on Eliquis now.  Will be on Eliquis long-term.  He is not complaining of headache.  It looks like he may be eating a little bit better.  He has had no nausea or vomiting.  There is been no diarrhea.  He has had no cough or shortness of breath.  I think he is walking a little bit.  His electrolytes show sodium 131.  Potassium 3.8.  BUN 37.  Creatinine 1.33.  His vital signs show temperature of 97.9.  Pulse 95.  Blood pressure 130/71.  Oxygen saturation is 96%.  His lungs sound clear bilaterally.  Cardiac exam regular rate and rhythm consistent with the atrial fibrillation.  Abdomen is soft.  He has decent bowel sounds.  There is no fluid wave.  Extremity shows no clubbing, cyanosis or edema.  He does have a healing left hip surgical scar.  Neurological exam shows no focal neurological deficits.  [name] is improving.  It has now been almost 2 weeks since he had his hip surgery.  He seems to be coming around from this change in mental status.  Again we will going to drop his Hydrea dose of 500 mg a day.  We will have to watch his white cell count closely.  He will continue on the Eliquis.  He will be on lifelong Eliquis because of the atrial fibrillation.  He does have this dural sinus thrombus.  He does have some abnormalities with his  hypercoagulable studies which we have to confirm.  This will be in about 2 months.  Again, I would Eusebio Me think that he is going to have to go to rehab to keep improving and get his strength.  His nutritional intake will be critical as far as him improving physically.    Lattie Haw, MD  [place] 10:27

## 2021-11-17 NOTE — Progress Notes (Signed)
Patient ID: Justin [name], male   DOB: 24-Dec-1955, 66 y.o.   MRN: [id number] Subjective: 13 Days Post-Op Procedure(s) (LRB): TOTAL HIP ARTHROPLASTY ANTERIOR APPROACH (Left)    Patient reports pain as mild.  Seems to be improving mentally. Awake and alert, very pleasant Recalls at least doing some walking this weekend  Out of unit Off heparin and back on Eliquis  Objective:   VITALS:   Vitals:   11/17/21 0437 11/17/21 0857  BP: 130/71 (!) 139/92  Pulse:  95  Resp: 20   Temp: 97.9 F (36.6 C)   SpO2: 96%     Neurovascular intact Incision: dressing C/D/I  LABS Recent Labs    11/15/21 0316 11/16/21 0245 11/17/21 0446  HGB 10.4* 9.8* 10.4*  HCT 32.1* 30.6* 32.4*  WBC 53.1* 30.8* 24.7*  PLT 206 191 205    Recent Labs    11/15/21 0316 11/16/21 0245 11/17/21 0446  NA 132* 131* 131*  K 3.4* 3.5 3.8  BUN 38* 39* 37*  CREATININE 1.72* 1.45* 1.33*  GLUCOSE 100* 84 106*    No results for input(s): "LABPT", "INR" in the last 72 hours.   Assessment/Plan: 13 Days Post-Op Procedure(s) (LRB): TOTAL HIP ARTHROPLASTY ANTERIOR APPROACH (Left)   Up with therapy  It is good to see that he has turned the corner a bit It would make sense to consider ST rehab given the down time during this hospitalization  I would like for him to go home soon to get home versus rehab but if he has to go I hope it will be an efficient use of his time.  D/C when able  We will remove his dressing tomorrow before he leaves

## 2021-11-17 NOTE — Progress Notes (Signed)
Inpatient Rehab Coordinator Note:  I met spoke patient via phone call to discuss CIR recommendations and goals/expectations of CIR stay.  We reviewed 3 hrs/day of therapy, physician follow up, and average length of stay 2 weeks (dependent upon progress) with goals of min A/mod I.  Patient is interested in pursuing CIR, states he lives with his wife and she is retired/available 24/7. I have left message for wife to ensure her ability to provide 24/7 assist. Will open case with insurance for potential admission when I speak to wife.   Rehab Admissons Coordinator  , PT, [facility] [phone]      

## 2021-11-17 NOTE — Progress Notes (Signed)
Dr. Marin Olp at bedside. This RN made him aware patient had small amount of bright red rectal bleeding upon bowel movement this morning.

## 2021-11-17 NOTE — Progress Notes (Signed)
PROGRESS NOTE/consult    Justin [name]  VWU:[id number] DOB: Feb 28, 1955 DOA: 11/04/2021 PCP: [name], MD    No chief complaint on file.   Brief Narrative:  66 y.o. male with history of essential thrombocythemia, paroxysmal atrial fibrillation on Eliquis, coronary artery disease status post CABG history of functional disability to the left hip secondary to end-stage arthritis, failed outpatient nonsurgical conservative treatment greater than 12 weeks ago was admitted to the orthopedic service for left total hip arthroplasty.    Patient noted to have had significant blood loss in the perioperative period and received 2 units packed transfusions perioperatively on 10/17.  Patient initiated on tranexamic acid by orthopedic surgery, admitted to the hospital with hospitalist group consulted for assistance with suspected pulmonary edema in addition to assistance with management of patient's atrial fibrillation, diabetes, coronary artery disease.    Patient continued to exhibit substantial anemia on 10/18 requiring an additional 2 units of packed red blood cells transfused.  Hospitalization 29/56 was complicated by worsening leukocytosis and the development of encephalopathy.  Initial work-up for encephalopathy was completely negative.  Patient was placed on empiric antibiotic therapy 10/19 which was discontinued on 10/22.  After work-up was found to be negative.  Leukocytosis has progressively worsened throughout the hospitalization and risen to remarkable levels.  Infectious disease consultation on 10/23 was obtained and it was felt the patient was not suffering from an underlying infectious process as the cause.  Due to persisting encephalopathy on 10/23 CT venogram was obtained and found to be highly suggestive of a dural venous sinus thrombosis.  Patient was initiated on a heparin infusion and transferred to the stepdown unit for close clinical monitoring and serial neurologic  checks.   Hospital course also complicated by acute kidney injury of likely multifactorial etiology which progressively improved and resolved  Due to flank bruising on physical exam on 10/24 heparin infusion was temporarily held while CT imaging of the abdomen pelvis was performed.  No retroperitoneal hemorrhage was identified and therefore heparin was resumed.      Assessment & Plan:  Principal Problem:   Dural venous sinus thrombosis Active Problems:   Acute metabolic encephalopathy   AKI (acute kidney injury) ([place])   Acute on chronic systolic CHF (congestive heart failure) (HCC)   Elevated INR   BPH with obstruction/lower urinary tract symptoms   Acute anemia   Leukocytosis   Chronic atrial fibrillation with RVR (HCC)   S/P total left hip arthroplasty   Coronary artery disease involving native heart without angina pectoris   Essential thrombocythemia (HCC)   Mixed diabetic hyperlipidemia associated with type 2 diabetes mellitus (HCC)   Protein-calorie malnutrition, severe (HCC)   Diuretic-induced hypokalemia   Hypomagnesemia   Acute urinary retention   Mixed hyperlipidemia   Acute postoperative anemia due to expected blood loss   Malnutrition of moderate degree    Assessment and Plan: * Dural venous sinus thrombosis CT venogram 10/23 confirmed the presence of a dural venous sinus thrombosis Atypical presentation as patient's primary symptoms have been encephalopathy and urinary retention, not headache Since initiation of heparin on 10/23, patient's encephalopathy has improved since only, but is a ways off from complete resolution Monitoring patient in the stepdown unit for any evidence of seizure activity or bleeding complications EEG with no seizures, moderate encephalopathy, no focal slowing noted. Patient was seen by neurology who was following who are recommended continue treatment for CVST with heparin with goal between 0.3-0.5 due to coagulopathy, heparin on full  dose  subcutaneous Lovenox preferred agent for treatment of CVST in the acute phase but after 5 days of treatment with heparin could be transition back to Eliquis 5 mg twice daily.  Per Neurology recommendations.   Clinical improvement. Neurology was following but have signed off.  Continuing serial neurologic checks Dr. Marin Olp has initiated a hypercoagulable work-up and has additionally initiated hydroxyurea  Due to bump in creatinine patient transition to Eliquis yesterday which she seems to be tolerating.   Neurology, hematology/oncology following and appreciate input and recommendations.  Acute metabolic encephalopathy Secondary to dural venous sinus thrombosis seen on CTV 10/23 as the most likely cause of the patient's encephalopathy, in combination with hospital delirium that comes from prolonged hospitalization. Clinical improvement. Ambien, Benadryl and opiates were all discontinued earlier in the hospitalization. B12 and folate levels unremarkable. Ammonia obtained on 10/21 unremarkable despite concerns for possible cirrhosis CT head and MRI brain essentially unremarkable TSH slightly elevated likely secondary to euthyroid sick VBG and ABG reveals no evidence of hypercapnia Improving clinically daily, alert oriented to self place and time. Following commands appropriately.  EEG with no seizures, moderate encephalopathy, no focal slowing. Neurology was following but have signed off.  Continue supportive care.  AKI (acute kidney injury) ([place]) Renal function improved to baseline a few days ago however creatinine trending back up.. AKI multifactorial Substantial improvement in renal function since placement of a Foley catheter on 10/21 and initiation of Flomax. Patient has a history of BPH with urinary retention in the past per review of old records CT imaging of the abdomen and pelvis revealing circumferential bladder wall thickening concerning for chronic urinary retention That  being said, dural venous sinus thrombosis is also been known to cause urinary retention Foley catheter removed however patient failed voiding trial and as such Foley catheter placed back in 11/12/2021.  Urine output of 1200 cc over the past 24 hours, patient was also on IV Lasix. Creatinine was trending back up, patient gently hydrated with IV fluids at 50 cc an hour for 24 hours with creatinine trending back down currently at 1.33.   IV Lasix discontinued.   -Discontinued bethanechol.   Monitor urine output. Minimizing use of nephrotoxic agents Monitoring renal function and electrolytes with serial chemistry Strict input and output monitoring  Acute on chronic systolic CHF (congestive heart failure) (HCC) Dry mucous membranes and dark urine on exam with concurrent bibasilar rales and diffuse pitting edema consistent with intravascular depletion with anasarca. Elevated on 10/20 at 1749, now down trended to 1137 on 10/23 Patient was aggressively diuresed with intravenous Lasix from 10/20 until morning of 10/23 Patient continues to exhibit significant bilateral pleural effusions with evidence of anasarca on exam and on CT imaging.   However I feel the majority of this volume overload is now due to third spacing from malnutrition/possible cirrhosis and not acute congestive heart failure  echocardiogram revealing slightly decreased ejection fraction of 45 to 50% revealing some mild systolic dysfunction which was confirmed via focused echo on 10/22. Per Dr Darrick Grinder discussion with the wife on 10/21  patient has been experiencing increasing shortness of breath for at least the past month even prior to his hip surgery. Repeat chest x-ray, still with concerns for pulmonary edema and as such patient placed on Lasix 20 mg IV every 12 hours in addition to IV albumin scheduled every 6 hours x24 hours.   Discontinued IV Lasix on 11/13/2021. Improving clinically, currently on 2 L of nasal cannula O2 with  sats of 98 to  100%. Monitor urine output. I's and O's.  Elevated INR Profoundly elevated INR greater than 10 confirmed with repeat testing on 10/21 Substantial improvement in INR with administration of 5 mg of vitamin K on 10/21 and additional '5mg'$  on 10/22.   No clinical evidence of bleeding Etiology unclear but likely multifactorial Clinical picture not consistent with DIC Right upper quadrant ultrasound suggestive of cirrhosis on 10/20 Evidence of significant splenomegaly on CT imaging of the abdomen and pelvis 10/24 Dr. Bryan Lemma with gastroenterology was consulted who is not entirely convinced the patient has true cirrhosis.  His input is appreciated.   Furthermore, appreciate Dr. Antonieta Pert input on this apparent coagulopathy   Type 2 diabetes mellitus without complication, without long-term current use of insulin (HCC)-resolved as of 11/06/2021 Patient been placed on Accu-Cheks before every meal and nightly with sliding scale insulin Holding home regimen of hypoglycemics Hemoglobin A1C 5.0% Diabetic Diet   BPH with obstruction/lower urinary tract symptoms Per review of the chart patient has experienced urinary retention secondary to BPH in 2021 prompting temporary placement of a Foley catheter Patient's Flomax was held for several days after surgery and was resumed on 10/21 Patient has a history of BPH with urinary retention in the past That being said, dural venous sinus thrombosis is also been known to cause urinary retention Foley catheter removed on 11/11/2021, patient noted to have failed voiding trial and as such Foley catheter placed back in 11/12/2021.  Discontinued bethanechol due to decreased threshold for seizure.  Will likely need outpatient follow-up with urology and will likely need to be discharged with Foley catheter.    Acute anemia Hemoglobin now stable at 10.4 today. Known history of essential thrombocytopenia on Hydrea No clinical evidence of  bleeding Total of 4 units of packed red blood cells transfused during this hospitalization on POD 0 and 1 MCV suggests a macrocytic anemia however vitamin B12 and folate from 08/2021 are unremarkable.  Monitoring hemoglobin and hematocrit with serial CBCs Hematology additionally following, their input is appreciated  Chronic atrial fibrillation with RVR (HCC) Improved rate control Continue home regimen of metoprolol as blood pressure tolerates Monitoring patient on telemetry Was on heparin for dural venous sinus thrombosis, transitioned to Lovenox and subsequently transitioned to Eliquis (11/15/2021).  Leukocytosis Worsening leukocytosis, impressively high during the hospitalization which is trending back down currently at 24.7 from 30.8 from 53.1 from 78.1 (11/14/2021). Cultures negative thus far Infectious disease consultation obtained 10/23 They believed at the time the patient is not suffering from an infectious process and that no further work-up or empiric antibiotics are necessary. Patient received a 3-day course of intravenous Zosyn from 10/19 until 10/21 which was discontinued due to no evidence of infection Peripheral smear on 10/22 unremarkable Patient placed back on hydroxyurea 10/24 by Dr. Marin Olp for leukocytosis which is currently trending back down at 24.7.  Hydroxyurea dose being adjusted. Appreciate hematology/oncology input and recommendations.  S/P total left hip arthroplasty Status post left hip arthroplasty 10/17 with Dr. Alvan Dame Per orthopedic surgery, patient to be weightbearing as tolerated at this point PT, OT evaluations Limited use of opiate-based analgesics due to ongoing delirium Patient noted with some blotchiness/duskiness left upper thigh however noted to have left leg warm and some tenderness to palpation and improving daily. Will defer to primary team, orthopedics,??  CT imaging of left thigh?? Per primary team.   Coronary artery disease involving native  heart without angina pectoris Continuing metoprolol, statin therapy Patient is currently chest pain-free Monitoring patient on telemetry  Protein-calorie malnutrition, severe (HCC) Poor oral intake with evidence of fat depletion and weight loss consistent with severe protein calorie malnutrition Poor nutritional status is contributing to profound anasarca/third spacing Nutrition following, their input is appreciated Nutritional supplements initiated Multivitamin Patient s/p IV albumin every 6 hours x 24 hours.  Mixed diabetic hyperlipidemia associated with type 2 diabetes mellitus ([place]) Continuing home regimen of lipid lowering therapy.   Essential thrombocythemia ([place]) No significant thrombocytosis at this time Dr. Marin Olp with hematology oncology following, his input is appreciated. Patient initiated on hydroxyurea 10/24 and dose being adjusted.  Diuretic-induced hypokalemia Improved but trending back down likely related to diuresis.  Potassium at 3.8.   Repeat labs in the AM.  Hypomagnesemia Magnesium repleted and currently at 1.8.  Repeat labs in the AM.    Acute urinary retention - Patient with history of BPH, noted to have acute urinary retention early on in the hospitalization Foley catheter placed. -Voiding trial done which patient failed. -Foley catheter placed back in.   -Continue Flomax, discontinued bethanechol. -Will likely need to be discharged with Foley catheter with outpatient follow-up with urology.         DVT prophylaxis: Eliquis Code Status: DNR Family Communication: Updated patient.  No family at bedside. Disposition: Transfer to telemetry.  Status is: Inpatient Remains inpatient appropriate because: Severity of illness   Consultants:  Neurology: Dr. Leonel Ramsay 11/08/2021 Hematology/oncology Dr. Marin Olp 11/05/2021 Gastroenterology: Dr. Bryan Lemma 11/08/2021 Hospitalist Dr. Grandville Silos 11/04/2021 Infectious disease: Dr.Vu  11/10/2021  Procedures:  EEG 11/12/2021 CT head 11/08/2021 CT abdomen and pelvis 11/11/2021 Chest x-ray 11/05/2021, 11/08/2021, 2023 2023, 11/12/2021 MRI brain 11/08/2021 Renal ultrasound 11/06/2021 Right upper quadrant ultrasound 11/07/2021 2D echo 11/07/2021 Limited echocardiogram 11/09/2021 CT venogram 11/10/2021 4 units PRBCs transfused  Antimicrobials:  Anti-infectives (From admission, onward)    Start     Dose/Rate Route Frequency Ordered Stop   11/06/21 1400  piperacillin-tazobactam (ZOSYN) IVPB 3.375 g  Status:  Discontinued        3.375 g 12.5 mL/hr over 240 Minutes Intravenous Every 8 hours 11/06/21 0722 11/09/21 0842   11/06/21 0815  piperacillin-tazobactam (ZOSYN) IVPB 3.375 g        3.375 g 100 mL/hr over 30 Minutes Intravenous  Once 11/06/21 0720 11/06/21 1548   11/04/21 1800  ceFAZolin (ANCEF) IVPB 2g/100 mL premix        2 g 200 mL/hr over 30 Minutes Intravenous Every 6 hours 11/04/21 1707 11/05/21 0048   11/04/21 0915  ceFAZolin (ANCEF) IVPB 2g/100 mL premix        2 g 200 mL/hr over 30 Minutes Intravenous On call to O.R. 11/04/21 0904 11/04/21 1239         Subjective: Sitting up in chair.  Overall feels well.  Denies any chest pain or shortness of breath.  No abdominal pain.     Objective: Vitals:   11/17/21 0857 11/17/21 1300 11/17/21 1435 11/17/21 1636  BP: (!) 139/92  129/75 115/73  Pulse: 95  72 81  Resp:   16 16  Temp:   97.8 F (36.6 C) 97.9 F (36.6 C)  TempSrc:   Oral Oral  SpO2:  (!) 89% 98% 100%  Weight:      Height:        Intake/Output Summary (Last 24 hours) at 11/17/2021 1758 Last data filed at 11/17/2021 1700 Gross per 24 hour  Intake 540 ml  Output 950 ml  Net -410 ml   Filed Weights   11/04/21 0941 11/10/21 1537  Weight:  75.8 kg 68.1 kg    Examination:  General exam: NAD Respiratory system: Decreased breath sounds in the bases otherwise clear.  No wheezes, no crackles, no rhonchi.  Fair air movement.  Speaking in  full sentences.  Cardiovascular system: Irregularly irregular.  No JVD.  No murmurs rubs or gallops.  Trace to 1+ bilateral lower extremity edema.   Gastrointestinal system: Abdomen is soft, nontender, nondistended, positive bowel sounds.  No rebound.  No guarding.  Central nervous system: Alert and oriented. No focal neurological deficits. Extremities: Left thigh with less blotchiness and less duskiness noted with some slight improvement over the past 24 hours..  Postop dressing noted on left hip.  Left foot and thigh warm.  Some tenderness to palpation in the left thigh. Skin: Large ecchymotic area noted in the left flank which has been stable and slowly regressing.  Psychiatry: Judgement and insight appear fair. Mood & affect appropriate.     Data Reviewed:   CBC: Recent Labs  Lab 11/11/21 0305 11/12/21 0257 11/13/21 0047 11/14/21 0412 11/15/21 0316 11/16/21 0245 11/17/21 0446  WBC 87.7*   < > 70.7* 70.1* 53.1* 30.8* 24.7*  NEUTROABS 71.9*  --   --   --   --   --   --   HGB 10.9*   < > 10.6* 10.5* 10.4* 9.8* 10.4*  HCT 34.8*   < > 33.3* 33.3* 32.1* 30.6* 32.4*  MCV 109.4*   < > 110.3* 111.4* 108.8* 110.9* 109.5*  PLT 312   < > 237 190 206 191 205   < > = values in this interval not displayed.    Basic Metabolic Panel: Recent Labs  Lab 11/12/21 0257 11/13/21 0047 11/14/21 0412 11/15/21 0316 11/16/21 0245 11/17/21 0446  NA 136 135 133* 132* 131* 131*  K 3.5 3.8 3.9 3.4* 3.5 3.8  CL 102 102 101 99 100 100  CO2 25 24 20* '25 23 23  '$ GLUCOSE 115* 135* 91 100* 84 106*  BUN 21 24* 31* 38* 39* 37*  CREATININE 1.10 1.15 1.51* 1.72* 1.45* 1.33*  CALCIUM 7.7* 8.0* 7.6* 7.6* 7.6* 8.0*  MG 1.6* 2.3 2.1 1.9  --  1.8  PHOS  --   --   --   --  4.0 4.1    GFR: Estimated Creatinine Clearance: 48.6 mL/min (A) (by C-G formula based on SCr of 1.33 mg/dL (H)).  Liver Function Tests: Recent Labs  Lab 11/11/21 0305 11/12/21 0257 11/15/21 0316 11/16/21 0245 11/17/21 0446  AST  39 28  --   --   --   ALT 12 11  --   --   --   ALKPHOS 136* 118  --   --   --   BILITOT 2.3* 2.1*  --   --   --   PROT 6.0* 5.2*  --   --   --   ALBUMIN 2.7* 2.4* 2.8* 3.0* 3.0*    CBG: No results for input(s): "GLUCAP" in the last 168 hours.    Recent Results (from the past 240 hour(s))  MRSA Next Gen by PCR, Nasal     Status: None   Collection Time: 11/10/21  3:48 PM   Specimen: Nasal Mucosa; Nasal Swab  Result Value Ref Range Status   MRSA by PCR Next Gen NOT DETECTED NOT DETECTED Final    Comment: (NOTE) The GeneXpert MRSA Assay (FDA approved for NASAL specimens only), is one component of a comprehensive MRSA colonization surveillance program. It is not intended to  diagnose MRSA infection nor to guide or monitor treatment for MRSA infections. Test performance is not FDA approved in patients less than 53 years old. Performed at [facility], [place] [address], [place], [place] [address]          Radiology Studies: No results found.      Scheduled Meds:  apixaban  5 mg Oral BID   Chlorhexidine Gluconate Cloth  6 each Topical Daily   feeding supplement  237 mL Oral BID BM   hydroxyurea  500 mg Oral Daily   loratadine  10 mg Oral Daily   metoprolol tartrate  25 mg Oral BID   multivitamin with minerals  1 tablet Oral Daily   pantoprazole  40 mg Oral BID   polyethylene glycol  17 g Oral Daily   rosuvastatin  20 mg Oral Daily   tamsulosin  0.4 mg Oral Daily   thiamine  100 mg Oral Daily   Continuous Infusions:  sodium chloride Stopped (11/11/21 1130)   methocarbamol (ROBAXIN) IV       LOS: 12 days    Time spent: 40 minutes    Irine Seal, MD [facility]   To contact the attending provider between 7A-7P or the covering provider during after hours 7P-7A, please log into the web site [email or web address] and access using universal  password for that web site. If you do not have the password, please call the hospital  operator.  11/17/2021, 5:58 PM

## 2021-11-17 NOTE — Progress Notes (Addendum)
Physical Therapy Treatment Patient Details Name: Justin [name] MRN: [id number] DOB: [birth year] Today's Date: 11/17/2021   History of Present Illness 66 y.o. male s/p Left THA on 11/04/21.  PMHx: A.Fib on Eliquis, CAD s/p CABG, AS s/p AVR with bioprosthetic, s/p CEA, DM2, HTN, essential thrombocytosis/JAK2+ chronic leukocytosis, course complicated by  dural venous sinus thrombosis post op.    PT Comments    POD x13 am session. Nurse observed ulcer on L heel and recommended a prevalon boot. Patients cognition was delayed in thoughts and response, needed increase time to process. Required mod assist with bed mobility. Mod assist +2 needed for transfers and gait. Progressed patient from EVA walker to a RW. Breaks needed during gait due to SOB. Verbal and tactile cues needed for safe hand placement. Reviewed pursed lip breathing for correct technique. Patient c/o of dizziness and nausea when sitting at EOB and during ambulation. Monitored vitals throughout session and returned patient into recliner. Patient plans to discharge to acute inpatient rehab. Sitting EOB   RA 92%         HR 69 bpm        BP 109/76 Gait               RA 98%         HR 83 bpm        BP 111/77  Recommendations for follow up therapy are one component of a multi-disciplinary discharge planning process, led by the attending physician.  Recommendations may be updated based on patient status, additional functional criteria and insurance authorization.  Follow Up Recommendations  Acute inpatient rehab (3hours/day)     Assistance Recommended at Discharge Frequent or constant Supervision/Assistance  Patient can return home with the following Help with stairs or ramp for entrance;A lot of help with walking and/or transfers;A little help with bathing/dressing/bathroom;Assistance with cooking/housework;Assist for transportation   Equipment Recommendations  Other (comment)    Recommendations for Other Services Rehab consult      Precautions / Restrictions Precautions Precautions: Fall Precaution Comments: monitor vitals Restrictions Weight Bearing Restrictions: No LLE Weight Bearing: Weight bearing as tolerated Other Position/Activity Restrictions: WBAT     Mobility  Bed Mobility Overal bed mobility: Needs Assistance Bed Mobility: Supine to Sit     Supine to sit: Mod assist, HOB elevated     General bed mobility comments: increased time as well as use of bed pad to complete scooting to EOB. Used safety belt to guide L LE off of bed.    Transfers Overall transfer level: Needs assistance Equipment used: Rolling walker (2 wheels)   Sit to Stand: Mod assist, +2 safety/equipment, Min assist           General transfer comment: vc and tactile cues each time for safe hand placement    Ambulation/Gait Ambulation/Gait assistance: Mod assist, +2 safety/equipment, +2 physical assistance Gait Distance (Feet): 50 Feet Assistive device: Rolling walker (2 wheels) Gait Pattern/deviations: Step-to pattern, Decreased stance time - left, Antalgic Gait velocity: decreased     General Gait Details: tolerated using RW for 50 feet +2 for recliner following. Several breaks needed due to SOB. Reviewed pursed lip breathing with patient.   Stairs             Wheelchair Mobility    Modified Rankin (Stroke Patients Only)       Balance  Cognition Arousal/Alertness: Awake/alert Behavior During Therapy: WFL for tasks assessed/performed, Flat affect Overall Cognitive Status: Within Functional Limits for tasks assessed Area of Impairment: Attention, Awareness, Memory                 Orientation Level: Time, Situation Current Attention Level: Sustained Memory: Decreased short-term memory Following Commands: Follows one step commands with increased time   Awareness: Emergent   General Comments: Cognition was delayed in thoughts and  responses. vc's and tactile cues needed for safe hand placement with every transfer.        Exercises Total Joint Exercises Short Arc Quad: AROM, Left, 10 reps, Supine Heel Slides: AAROM, Left, 10 reps, Supine Hip ABduction/ADduction: AAROM, Left, Supine, 10 reps Straight Leg Raises: Left, 10 reps, AAROM    General Comments        Pertinent Vitals/Pain Pain Assessment Pain Assessment: Faces Faces Pain Scale: Hurts a little bit Pain Location: L hip Pain Descriptors / Indicators: Tightness, Sore Pain Intervention(s): Limited activity within patient's tolerance, Monitored during session    Home Living                          Prior Function            PT Goals (current goals can now be found in the care plan section) Acute Rehab PT Goals Patient Stated Goal: to get oob PT Goal Formulation: With patient Time For Goal Achievement: 11/26/21 Potential to Achieve Goals: Fair Progress towards PT goals: Progressing toward goals    Frequency    7X/week      PT Plan Current plan remains appropriate    Co-evaluation PT/OT/SLP Co-Evaluation/Treatment: Yes   PT goals addressed during session: Mobility/safety with mobility;Strengthening/ROM        AM-PAC PT "6 Clicks" Mobility   Outcome Measure  Help needed turning from your back to your side while in a flat bed without using bedrails?: A Lot Help needed moving from lying on your back to sitting on the side of a flat bed without using bedrails?: A Lot Help needed moving to and from a bed to a chair (including a wheelchair)?: A Lot Help needed standing up from a chair using your arms (e.g., wheelchair or bedside chair)?: A Lot Help needed to walk in hospital room?: A Lot Help needed climbing 3-5 steps with a railing? : Total 6 Click Score: 11    End of Session Equipment Utilized During Treatment: Gait belt Activity Tolerance: Patient limited by fatigue Patient left: in chair;with call bell/phone within  reach;with chair alarm set Nurse Communication: Mobility status PT Visit Diagnosis: Other abnormalities of gait and mobility (R26.89)     Time:  - 1610 - 1136    Charges:    1 Gt   1 ta                       Allyson Sabal 11/17/2021, 1:33 PM  I agree with the following treatment note.  This session was performed under the supervision of a licensed clinician [name]  PTA [facility] M-F          [phone] Weekend pager [phone]

## 2021-11-17 NOTE — PMR Pre-admission (Signed)
PMR Admission Coordinator Pre-Admission Assessment  Patient: Justin [name] is an 66 y.o., male MRN: [id number] DOB: Nov 01, 1955 Height: 5\' 6"  (167.6 cm) Weight: 68.1 kg  Insurance Information HMO:  Yes    PRIMARY: BCBS medicare      Policy#: [id number]      Subscriber: Patient     Phone#: [phone]     Fax#: [phone] Pre-Cert#:  Auth # ____________________ approved by Victorino Dike at [phone]      Eff. Date: 01/19/21-still active     Deduct: $0     Out of Pocket Max: $3,[phone] [id number] met)      CIR: $335/day co pay for days 1-5      SNF: $0 co pay for days 1-20, $196/day co pay for days 21-60, $0 copay for days 61-100. Max of 100 days/benefit period Outpatient: $10 co pay/visit       Home Health: 100% coverage       DME: 80%     Co-Pay: 20%   The "Data Collection Information Summary" for patients in Inpatient Rehabilitation Facilities with attached "Privacy Act Statement-Health Care Records" was provided and verbally reviewed with: Patient and Family  Emergency Contact Information Contact Information     Name Relation Home Work Mobile   Arion, Bufkin [phone]  [phone]      Patient admitting Diagnosis:L THA, Dural venous sinus thormbosis  Current Medical History :  A 66 y.o. male admitted to [facility] for left total hip arthroplasty 10/17 by Dr. Charlann Boxer.  Patient noted to have had significant blood loss in the perioperative period and received 2 units packed transfusions perioperatively on 10/17.  Patient initiated on tranexamic acid by orthopedic surgery, admitted to the hospital with hospitalist group consulted for assistance with suspected pulmonary edema in addition to assistance with management of patient's atrial fibrillation, diabetes, coronary artery disease. 10/18 Notified by bedside RN that patients Spo2 was reading 88% on 2L, RN also stated patients pulse/ Heart rate was very variable 25-130s. Patient just finished 1st of 2 PRBC, and has IV iron  ordered. Chest x-ray, IV Lasix, and hold second transfusion and IV Ferumoxytol until chest x-ray results come back. Hospitalization 10/19 was complicated by worsening leukocytosis and the development of encephalopathy concerning for an underlying infectious process. Patient was placed on empiric antibiotic therapy. Hospital course also complicated by acute kidney injury. CT head was obtained with no acute findings. Neurology consulted for assistance 10/22 Profoundly elevated INR greater than 10 confirmed with repeat testingSubstantial improvement in INR with administration of 5 mg of vitamin K on 10/21. We will repeat a dose today. No clinical evidence of bleeding. Right upper quadrant ultrasound suggestive of cirrhosis.  Dr. Barron Alvine with gastroenterology was consulted who is not entirely convinced the patient has true cirrhosis.  Furthermore, appreciate Dr. Gustavo Lah input on this apparent coagulopathy. Going to hold Eliquis for now  which can cause an elevated INR but typically not greater than 2. Echocardiogram revealing slightly decreased ejection fraction of 45 to 50% revealing some mild systolic dysfunction which was confirmed via focused echo on 10/22. Continuing short course of intravenous Lasix twice daily with continued excellent diuretic response. Completed MRI brain yesterday with asymmetrically decreased signal in left sigmoid sinus with absent flow void which may represent slow flow or occlusion.  TTE completed earlier today with interpretation pending. INR 3.3, H/H stable at 9.5/29.8.  Slight improvement in renal function with creatinine 1.87.  PCT downtrending 10/24 continues to have significantly high leukocytosis. CT venogram 10/23 confirmed the presence  of a dural venous sinus thrombosis  Atypical presentation as patient's primary symptoms have been encephalopathy and urinary retention, not headache. Therapies are recommending intensive rehab program.     Patient's medical record from [facility] has been reviewed by the rehabilitation admission coordinator and physician.  Past Medical History  Past Medical History:  Diagnosis Date   Acute meniscal tear of knee LEFT   Anemia    Aortic stenosis 12/01/2017   NONRHEUMATIC, AORTIC VALVE CALCIFICATIONS, MILD TO MODERATE REGURG, MILD TO MODERATE CALCIFIED ANNULUS per ECHO 10/25/17 @ [facility] [address]   Arthritis    Atrial fibrillation (HCC) 11/12/2017   AT O/V WITH PCP   Cancer (HCC)    skin right arm   Coronary artery disease    Dyspnea    GERD (gastroesophageal reflux disease)    Headache    hx of migraines   Heart murmur MILD-- ASYMPTOMATIC   History of kidney stones    Hyperlipidemia    Hypertension    Left knee pain    Mixed diabetic hyperlipidemia associated with type 2 diabetes mellitus (HCC) 11/05/2021   PAD (peripheral artery disease) (HCC)    left leg claudication   Sleep apnea     Has the patient had major surgery during 100 days prior to admission? Yes  Family History   family history includes Bladder Cancer in his father. He was adopted.  Current Medications  Current Facility-Administered Medications:    0.9 %  sodium chloride infusion, , Intravenous, PRN, Durene Romans, MD, Stopped at 11/11/21 1130   acetaminophen (TYLENOL) tablet 325-650 mg, 325-650 mg, Oral, Q6H PRN, Cassandria Anger, PA-C, 325 mg at 11/19/21 0934   albuterol (PROVENTIL) (2.5 MG/3ML) 0.083% nebulizer solution 2.5 mg, 2.5 mg, Inhalation, Q4H PRN, Shalhoub, Deno Lunger, MD   apixaban Everlene Balls) tablet 5 mg, 5 mg, Oral, BID, Rodolph Bong, MD, 5 mg at 11/19/21 4098   bisacodyl (DULCOLAX) suppository 10 mg, 10 mg, Rectal, Daily PRN, Cassandria Anger, PA-C   Chlorhexidine Gluconate Cloth 2 % PADS 6 each, 6 each, Topical, Daily, Shalhoub, Deno Lunger, MD, 6 each at 11/18/21 1615   feeding supplement (ENSURE ENLIVE / ENSURE PLUS) liquid 237 mL, 237 mL, Oral, BID BM, Durene Romans, MD, 237 mL at 11/13/21 1529   hydroxyurea (HYDREA) capsule  500 mg, 500 mg, Oral, q12n4p, Ennever, Rose Phi, MD   lip balm (CARMEX) ointment, , Topical, PRN, Durene Romans, MD   liver oil-zinc oxide (DESITIN) 40 % ointment, , Topical, PRN, Rodolph Bong, MD   loratadine (CLARITIN) tablet 10 mg, 10 mg, Oral, Daily, Cassandria Anger, PA-C, 10 mg at 11/19/21 1191   menthol-cetylpyridinium (CEPACOL) lozenge 3 mg, 1 lozenge, Oral, PRN **OR** phenol (CHLORASEPTIC) mouth spray 1 spray, 1 spray, Mouth/Throat, PRN, Cassandria Anger, PA-C   methocarbamol (ROBAXIN) tablet 500 mg, 500 mg, Oral, Q6H PRN, 500 mg at 11/18/21 0935 **OR** methocarbamol (ROBAXIN) 500 mg in dextrose 5 % 50 mL IVPB, 500 mg, Intravenous, Q6H PRN, Cassandria Anger, PA-C   metoCLOPramide (REGLAN) tablet 5-10 mg, 5-10 mg, Oral, Q8H PRN **OR** metoCLOPramide (REGLAN) injection 5-10 mg, 5-10 mg, Intravenous, Q8H PRN, Cassandria Anger, PA-C   metoprolol tartrate (LOPRESSOR) tablet 25 mg, 25 mg, Oral, BID, Rodolph Bong, MD, 25 mg at 11/19/21 0933   multivitamin with minerals tablet 1 tablet, 1 tablet, Oral, Daily, Durene Romans, MD, 1 tablet at 11/19/21 0933   nitroGLYCERIN (NITROSTAT) SL tablet 0.4 mg, 0.4 mg, Sublingual, Q5 min  PRN, Shalhoub, Deno Lunger, MD   ondansetron [facility]) tablet 4 mg, 4 mg, Oral, Q6H PRN **OR** ondansetron (ZOFRAN) injection 4 mg, 4 mg, Intravenous, Q6H PRN, Rosalene Billings R, PA-C   oxyCODONE (Oxy IR/ROXICODONE) immediate release tablet 5 mg, 5 mg, Oral, Q4H PRN, Rodolph Bong, MD, 5 mg at 11/19/21 0933   pantoprazole (PROTONIX) EC tablet 40 mg, 40 mg, Oral, BID, Cassandria Anger, PA-C, 40 mg at 11/19/21 0933   polyethylene glycol (MIRALAX / GLYCOLAX) packet 17 g, 17 g, Oral, Daily, Cassandria Anger, PA-C, 17 g at 11/17/21 0857   rosuvastatin (CRESTOR) tablet 20 mg, 20 mg, Oral, Daily, Cassandria Anger, PA-C, 20 mg at 11/19/21 0932   tamsulosin (FLOMAX) capsule 0.4 mg, 0.4 mg, Oral, Daily, Shalhoub, Deno Lunger, MD, 0.4 mg at 11/19/21 4098   [DISCONTINUED]  thiamine (VITAMIN B1) injection 100 mg, 100 mg, Intravenous, Daily, 100 mg at 11/10/21 1238 **OR** thiamine (VITAMIN B1) tablet 100 mg, 100 mg, Oral, Daily, Durene Romans, MD, 100 mg at 11/19/21 1191  Patients Current Diet:  Diet Order             Diet regular Room service appropriate? Yes; Fluid consistency: Thin  Diet effective now                   Precautions / Restrictions Precautions Precautions: Fall Precaution Comments: monitor vitals Restrictions Weight Bearing Restrictions: No LLE Weight Bearing: Weight bearing as tolerated Other Position/Activity Restrictions: WBAT   Has the patient had 2 or more falls or a fall with injury in the past year? No  Prior Activity Level Community (5-7x/wk): Independent prior to admission  Prior Functional Level Self Care: Did the patient need help bathing, dressing, using the toilet or eating? Independent  Indoor Mobility: Did the patient need assistance with walking from room to room (with or without device)? Independent  Stairs: Did the patient need assistance with internal or external stairs (with or without device)? Independent  Functional Cognition: Did the patient need help planning regular tasks such as shopping or remembering to take medications? Independent  Patient Information Are you of Hispanic, Latino/a,or Spanish origin?: A. No, not of Hispanic, Latino/a, or Spanish origin What is your race?: A. White Do you need or want an interpreter to communicate with a doctor or health care staff?: 0. No  Patient's Response To:  Health Literacy and Transportation Is the patient able to respond to health literacy and transportation needs?: Yes Health Literacy - How often do you need to have someone help you when you read instructions, pamphlets, or other written material from your doctor or pharmacy?: Never In the past 12 months, has lack of transportation kept you from medical appointments or from getting medications?: No In  the past 12 months, has lack of transportation kept you from meetings, work, or from getting things needed for daily living?: No  Home Assistive Devices / Equipment Home Assistive Devices/Equipment: Environmental consultant (specify type) Home Equipment: None  Prior Device Use: Indicate devices/aids used by the patient prior to current illness, exacerbation or injury?  none  Current Functional Level Cognition  Arousal/Alertness: Awake/alert Overall Cognitive Status: Within Functional Limits for tasks assessed Current Attention Level: Sustained Orientation Level: Oriented X4 Following Commands: Follows one step commands with increased time General Comments: Cognition was delayed in thoughts and responses. vc's and tactile cues needed for safe hand placement with every transfer. Attention: Sustained Sustained Attention: Impaired Sustained Attention Impairment: Verbal basic, Functional basic Memory: Impaired Memory Impairment:  Storage deficit, Decreased recall of new information Awareness: Impaired Awareness Impairment: Intellectual impairment Problem Solving: Impaired Problem Solving Impairment: Verbal basic, Functional basic Behaviors: Perseveration Safety/Judgment: Impaired    Extremity Assessment (includes Sensation/Coordination)  Upper Extremity Assessment: Overall WFL for tasks assessed  Lower Extremity Assessment: Defer to PT evaluation LLE Deficits / Details: anticipated post op hip weakness observed    ADLs  Overall ADL's : Needs assistance/impaired Eating/Feeding: Set up, Sitting Grooming: Wash/dry face, Wash/dry hands, Set up, Sitting Upper Body Bathing: Moderate assistance Upper Body Bathing Details (indicate cue type and reason): for back Lower Body Bathing: Maximal assistance, Sitting/lateral leans Upper Body Dressing : Minimal assistance Upper Body Dressing Details (indicate cue type and reason): to don second gown like robe Lower Body Dressing: Maximal assistance,  Sitting/lateral leans Toilet Transfer: Moderate assistance, +2 for safety/equipment, Stand-pivot, BSC/3in1, Rolling walker (2 wheels) Toilet Transfer Details (indicate cue type and reason): urgency Toileting- Clothing Manipulation and Hygiene: Maximal assistance, +2 for safety/equipment, Sit to/from stand Toileting - Clothing Manipulation Details (indicate cue type and reason): able to maintain standing for extensive rear peri care/cleaning Functional mobility during ADLs: Minimal assistance, +2 for physical assistance, +2 for safety/equipment (EVA walker) General ADL Comments: decreased activity tolerance DOE, decreased balance, decreased cognition    Mobility  Overal bed mobility: Needs Assistance Bed Mobility: Supine to Sit Supine to sit: Mod assist, HOB elevated Sit to supine: Max assist General bed mobility comments: OOB in recliner    Transfers  Overall transfer level: Needs assistance Equipment used: Rolling walker (2 wheels) Transfers: Sit to/from Stand, Bed to chair/wheelchair/BSC Sit to Stand: Min assist Bed to/from chair/wheelchair/BSC transfer type:: Step pivot Step pivot transfers: Mod assist, +2 safety/equipment, +2 physical assistance General transfer comment: vc and tactile cues each time for safe hand placement    Ambulation / Gait / Stairs / Wheelchair Mobility  Ambulation/Gait Ambulation/Gait assistance: Min assist, Mod assist, +2 physical assistance, +2 safety/equipment Gait Distance (Feet): 65 Feet Assistive device: Rolling walker (2 wheels) Gait Pattern/deviations: Step-to pattern, Decreased stance time - left, Antalgic General Gait Details: tolerated using RW for 65 feet +2 for recliner following. Required one seated rest break due to c/o dizziness.  Vitals WNL. Gait velocity: decreased    Posture / Balance Dynamic Sitting Balance Sitting balance - Comments: reports dizziness Balance Overall balance assessment: Needs assistance Sitting-balance support:  Bilateral upper extremity supported, Feet supported Sitting balance-Leahy Scale: Fair Sitting balance - Comments: reports dizziness Standing balance support: Bilateral upper extremity supported, Reliant on assistive device for balance, During functional activity Standing balance-Leahy Scale: Poor Standing balance comment: reliant on RW and therapist    Special needs/care consideration Skin L hip surgical wound, L heel pressure wound   Previous Home Environment (from acute therapy documentation) Living Arrangements: Spouse/significant other  Lives With: Spouse Available Help at Discharge: Family, Available 24 hours/day Type of Home: House Home Layout: One level Home Access: Stairs to enter Entrance Stairs-Rails: None Entrance Stairs-Number of Steps: 1 Bathroom Shower/Tub: Health visitor: Pharmacist, community: Yes How Accessible: Accessible via walker Home Care Services: No Additional Comments: lived internationally (fluent in spanish - lived in [place], [place], [place] air flight attendant as an adult  Discharge Living Setting Plans for Discharge Living Setting: Patient's home, Lives with (comment) Type of Home at Discharge: House Discharge Home Layout: One level Discharge Home Access: Stairs to enter Entrance Stairs-Rails: None Entrance Stairs-Number of Steps: 1 Discharge Bathroom Shower/Tub: Walk-in shower Discharge Bathroom Toilet: Standard Discharge Bathroom Accessibility: Yes  How Accessible: Accessible via walker Does the patient have any problems obtaining your medications?: No  Social/Family/Support Systems Patient Roles: Spouse Anticipated Caregiver: Spouse, Gavin Pound Anticipated Industrial/product designer Information: [phone] Caregiver Availability: 24/7 Discharge Plan Discussed with Primary Caregiver: Yes Is Caregiver In Agreement with Plan?: Yes Does Caregiver/Family have Issues with Lodging/Transportation while Pt is in Rehab?:  No  Goals Patient/Family Goal for Rehab: Mod I PT, OT Expected length of stay: 14-17 days Pt/Family Agrees to Admission and willing to participate: Yes Program Orientation Provided & Reviewed with Pt/Caregiver Including Roles  & Responsibilities: Yes  Barriers to Discharge: Insurance for SNF coverage  Decrease burden of Care through IP rehab admission: Othern/a  Possible need for SNF placement upon discharge: not aniticipated  Patient Condition: I have reviewed medical records from [facility], spoken with  TOC , and patient and spouse. I discussed via phone for inpatient rehabilitation assessment.  Patient will benefit from ongoing PT and OT, can actively participate in 3 hours of therapy a day 5 days of the week, and can make measurable gains during the admission.  Patient will also benefit from the coordinated team approach during an Inpatient Acute Rehabilitation admission.  The patient will receive intensive therapy as well as Rehabilitation physician, nursing, social worker, and care management interventions.  Due to bladder management, safety, skin/wound care, disease management, medication administration, pain management, and patient education the patient requires 24 hour a day rehabilitation nursing.  The patient is currently min to mod assist with mobility and basic ADLs.  Discharge setting and therapy post discharge at home with home health is anticipated.  Patient has agreed to participate in the Acute Inpatient Rehabilitation Program and will admit today, 11/19/21.  Preadmission Screen Completed By:  Trish Mage, 11/19/2021 11:44 AM ______________________________________________________________________   Discussed status with Dr. Carlis Abbott on 11/19/21 at 0945 and received approval for admission today.  Admission Coordinator:  Trish Mage, RN, time 1130/Date 11/19/21   Assessment/Plan: Diagnosis: Dural venous sinus thrombosis Does the need for close, 24 hr/day Medical  supervision in concert with the patient's rehab needs make it unreasonable for this patient to be served in a less intensive setting? Yes Co-Morbidities requiring supervision/potential complications: leukocytosis, chronic atrial fibrillation with RVR, essential thrombocytopenia, CAD, acute anemia Due to bladder management, bowel management, safety, skin/wound care, disease management, medication administration, pain management, and patient education, does the patient require 24 hr/day rehab nursing? Yes Does the patient require coordinated care of a physician, rehab nurse, PT, OT, SLP to address physical and functional deficits in the context of the above medical diagnosis(es)? Yes Addressing deficits in the following areas: balance, endurance, locomotion, strength, transferring, bowel/bladder control, bathing, dressing, feeding, grooming, toileting, cognition, and psychosocial support Can the patient actively participate in an intensive therapy program of at least 3 hrs of therapy 5 days a week? Yes The potential for patient to make measurable gains while on inpatient rehab is excellent Anticipated functional outcomes upon discharge from inpatient rehab: supervision PT, supervision OT, supervision SLP Estimated rehab length of stay to reach the above functional goals is: 7-9 days Anticipated discharge destination: Home 10. Overall Rehab/Functional Prognosis: excellent   MD Signature: Sula Soda, MD

## 2021-11-18 DIAGNOSIS — I5023 Acute on chronic systolic (congestive) heart failure: Secondary | ICD-10-CM | POA: Diagnosis not present

## 2021-11-18 DIAGNOSIS — G9341 Metabolic encephalopathy: Secondary | ICD-10-CM | POA: Diagnosis not present

## 2021-11-18 DIAGNOSIS — D62 Acute posthemorrhagic anemia: Secondary | ICD-10-CM | POA: Diagnosis not present

## 2021-11-18 DIAGNOSIS — G08 Intracranial and intraspinal phlebitis and thrombophlebitis: Secondary | ICD-10-CM | POA: Diagnosis not present

## 2021-11-18 LAB — BASIC METABOLIC PANEL
Anion gap: 8 (ref 5–15)
BUN: 39 mg/dL — ABNORMAL HIGH (ref 8–23)
CO2: 23 mmol/L (ref 22–32)
Calcium: 7.9 mg/dL — ABNORMAL LOW (ref 8.9–10.3)
Chloride: 101 mmol/L (ref 98–111)
Creatinine, Ser: 1.54 mg/dL — ABNORMAL HIGH (ref 0.61–1.24)
GFR, Estimated: 49 mL/min — ABNORMAL LOW (ref >60)
Glucose, Bld: 92 mg/dL (ref 70–99)
Potassium: 4.1 mmol/L (ref 3.5–5.1)
Sodium: 132 mmol/L — ABNORMAL LOW (ref 135–145)

## 2021-11-18 LAB — CBC
HCT: 32.1 % — ABNORMAL LOW (ref 39.0–52.0)
Hemoglobin: 10.2 g/dL — ABNORMAL LOW (ref 13.0–17.0)
MCH: 34.7 pg — ABNORMAL HIGH (ref 26.0–34.0)
MCHC: 31.8 g/dL (ref 30.0–36.0)
MCV: 109.2 fL — ABNORMAL HIGH (ref 80.0–100.0)
Platelets: 197 10*3/uL (ref 150–400)
RBC: 2.94 MIL/uL — ABNORMAL LOW (ref 4.22–5.81)
RDW: 26.1 % — ABNORMAL HIGH (ref 11.5–15.5)
WBC: 26.3 10*3/uL — ABNORMAL HIGH (ref 4.0–10.5)
nRBC: 0.9 % — ABNORMAL HIGH (ref 0.0–0.2)

## 2021-11-18 NOTE — TOC Progression Note (Addendum)
Transition of Care [facility]) - Progression Note    Patient Details  Name: Justin [name] MRN: [id number] Date of Birth: [birth year]  Transition of Care [facility]) CM/SW Contact  Ross Ludwig, [place] Phone Number: 11/18/2021, 4:46 PM  Clinical Narrative:     CSW spoke to [facility] admissions they have spoke to patient's wife, and she would like to pursue CIR.  Per CIR admissions coordinator they have started insurance authorization.  TOC to continue to follow in case patient's needs change and he needs SNF.     Barriers to Discharge: No Barriers Identified  Expected Discharge Plan and Services                           DME Arranged: N/A DME Agency: NA                   Social Determinants of Health (SDOH) Interventions    Readmission Risk Interventions    11/06/2021    9:35 AM  Readmission Risk Prevention Plan  Transportation Screening Complete  PCP or Specialist Appt within 3-5 Days Complete  HRI or [place] Complete  Social Work Consult for [place] Planning/Counseling Complete  Palliative Care Screening Not Applicable  Medication Review Press photographer) Complete

## 2021-11-18 NOTE — Progress Notes (Signed)
PROGRESS NOTE/consult    Justin [name]  OBS:[id number] DOB: [birth year] DOA: 11/04/2021 PCP: [name], MD    No chief complaint on file.   Brief Narrative:  66 y.o. male with history of essential thrombocythemia, paroxysmal atrial fibrillation on Eliquis, coronary artery disease status post CABG history of functional disability to the left hip secondary to end-stage arthritis, failed outpatient nonsurgical conservative treatment greater than 12 weeks ago was admitted to the orthopedic service for left total hip arthroplasty.    Patient noted to have had significant blood loss in the perioperative period and received 2 units packed transfusions perioperatively on 10/17.  Patient initiated on tranexamic acid by orthopedic surgery, admitted to the hospital with hospitalist group consulted for assistance with suspected pulmonary edema in addition to assistance with management of patient's atrial fibrillation, diabetes, coronary artery disease.    Patient continued to exhibit substantial anemia on 10/18 requiring an additional 2 units of packed red blood cells transfused.  Hospitalization 47/65 was complicated by worsening leukocytosis and the development of encephalopathy.  Initial work-up for encephalopathy was completely negative.  Patient was placed on empiric antibiotic therapy 10/19 which was discontinued on 10/22.  After work-up was found to be negative.  Leukocytosis has progressively worsened throughout the hospitalization and risen to remarkable levels.  Infectious disease consultation on 10/23 was obtained and it was felt the patient was not suffering from an underlying infectious process as the cause.  Due to persisting encephalopathy on 10/23 CT venogram was obtained and found to be highly suggestive of a dural venous sinus thrombosis.  Patient was initiated on a heparin infusion and transferred to the stepdown unit for close clinical monitoring and serial neurologic  checks.   Hospital course also complicated by acute kidney injury of likely multifactorial etiology which progressively improved and resolved  Due to flank bruising on physical exam on 10/24 heparin infusion was temporarily held while CT imaging of the abdomen pelvis was performed.  No retroperitoneal hemorrhage was identified and therefore heparin was resumed.      Assessment & Plan:  Principal Problem:   Dural venous sinus thrombosis Active Problems:   Acute metabolic encephalopathy   AKI (acute kidney injury) ([place])   Acute on chronic systolic CHF (congestive heart failure) (HCC)   Elevated INR   BPH with obstruction/lower urinary tract symptoms   Acute anemia   Leukocytosis   Chronic atrial fibrillation with RVR (HCC)   S/P total left hip arthroplasty   Coronary artery disease involving native heart without angina pectoris   Essential thrombocythemia (HCC)   Mixed diabetic hyperlipidemia associated with type 2 diabetes mellitus (HCC)   Protein-calorie malnutrition, severe (HCC)   Diuretic-induced hypokalemia   Hypomagnesemia   Acute urinary retention   Mixed hyperlipidemia   Acute postoperative anemia due to expected blood loss   Malnutrition of moderate degree    Assessment and Plan: * Dural venous sinus thrombosis CT venogram 10/23 confirmed the presence of a dural venous sinus thrombosis Atypical presentation as patient's primary symptoms have been encephalopathy and urinary retention, not headache Since initiation of heparin on 10/23, patient's encephalopathy has improved since only, but is a ways off from complete resolution Monitoring patient in the stepdown unit for any evidence of seizure activity or bleeding complications EEG with no seizures, moderate encephalopathy, no focal slowing noted. Patient was seen by neurology who was following who are recommended continue treatment for CVST with heparin with goal between 0.3-0.5 due to coagulopathy, heparin on full  dose  subcutaneous Lovenox preferred agent for treatment of CVST in the acute phase but after 5 days of treatment with heparin could be transition back to Eliquis 5 mg twice daily.  Per Neurology recommendations.   Clinical improvement. Neurology was following but have signed off.  Continuing serial neurologic checks Dr. Marin Olp has initiated a hypercoagulable work-up and has additionally initiated hydroxyurea  Due to bump in creatinine patient transitioned to Eliquis which he seems to be tolerating.   Neurology, hematology/oncology following and appreciate input and recommendations.  Acute metabolic encephalopathy Secondary to dural venous sinus thrombosis seen on CTV 10/23 as the most likely cause of the patient's encephalopathy, in combination with hospital delirium that comes from prolonged hospitalization. Clinical improvement. Ambien, Benadryl and opiates were all discontinued earlier in the hospitalization. B12 and folate levels unremarkable. Ammonia obtained on 10/21 unremarkable despite concerns for possible cirrhosis CT head and MRI brain essentially unremarkable TSH slightly elevated likely secondary to euthyroid sick VBG and ABG reveals no evidence of hypercapnia Improving clinically daily, alert oriented to self place and time. Following commands appropriately.  EEG with no seizures, moderate encephalopathy, no focal slowing. Neurology was following but have signed off.  Low-dose opioid resumed for pain control, monitor mental status closely. Continue supportive care.  AKI (acute kidney injury) ([place]) Renal function improved to baseline a few days ago however creatinine trending back up.. AKI multifactorial Substantial improvement in renal function since placement of a Foley catheter on 10/21 and initiation of Flomax. Patient has a history of BPH with urinary retention in the past per review of old records CT imaging of the abdomen and pelvis revealing circumferential bladder  wall thickening concerning for chronic urinary retention That being said, dural venous sinus thrombosis is also been known to cause urinary retention Foley catheter removed however patient failed voiding trial and as such Foley catheter placed back in 11/12/2021.  Urine output of 1300 cc over the past 24 hours, patient was also on IV Lasix which has subsequently been discontinued.. Creatinine was trending back up, patient gently hydrated with IV fluids at 50 cc an hour for 24 hours with creatinine trending back down currently at 1.54.   IV Lasix discontinued.   -Discontinued bethanechol.   Monitor urine output. Minimizing use of nephrotoxic agents Monitoring renal function and electrolytes with serial chemistry Strict input and output monitoring  Acute on chronic systolic CHF (congestive heart failure) (HCC) Dry mucous membranes and dark urine on exam with concurrent bibasilar rales and diffuse pitting edema consistent with intravascular depletion with anasarca. Elevated on 10/20 at 1749, now down trended to 1137 on 10/23 Patient was aggressively diuresed with intravenous Lasix from 10/20 until morning of 10/23 Patient noted to have significant bilateral effusions with evidence of anasarca on exam and on CT imaging early on during the hospitalization which seems to be improved.  However I feel the majority of this volume overload is now due to third spacing from malnutrition/possible cirrhosis and not acute congestive heart failure  echocardiogram revealing slightly decreased ejection fraction of 45 to 50% revealing some mild systolic dysfunction which was confirmed via focused echo on 10/22. Per Dr Darrick Grinder discussion with the wife on 10/21  patient has been experiencing increasing shortness of breath for at least the past month even prior to his hip surgery. Repeat chest x-ray, still with concerns for pulmonary edema and as such patient placed on Lasix 20 mg IV every 12 hours in addition to  IV albumin scheduled every 6 hours x24 hours.  Discontinued IV Lasix on 11/13/2021. Improving clinically, currently on 2 L of nasal cannula O2 with sats of 99%. Monitor urine output. I's and O's.  Elevated INR Profoundly elevated INR greater than 10 confirmed with repeat testing on 10/21 Substantial improvement in INR with administration of 5 mg of vitamin K on 10/21 and additional '5mg'$  on 10/22.   No clinical evidence of bleeding Etiology unclear but likely multifactorial Clinical picture not consistent with DIC Right upper quadrant ultrasound suggestive of cirrhosis on 10/20 Evidence of significant splenomegaly on CT imaging of the abdomen and pelvis 10/24 Dr. Bryan Lemma with gastroenterology was consulted who is not entirely convinced the patient has true cirrhosis.  His input is appreciated.   Furthermore, appreciate Dr. Antonieta Pert input on this apparent coagulopathy   Type 2 diabetes mellitus without complication, without long-term current use of insulin (HCC)-resolved as of 11/06/2021 Patient been placed on Accu-Cheks before every meal and nightly with sliding scale insulin Holding home regimen of hypoglycemics Hemoglobin A1C 5.0% Diabetic Diet   BPH with obstruction/lower urinary tract symptoms Per review of the chart patient has experienced urinary retention secondary to BPH in 2021 prompting temporary placement of a Foley catheter Patient's Flomax was held for several days after surgery and was resumed on 10/21 Patient has a history of BPH with urinary retention in the past That being said, dural venous sinus thrombosis is also been known to cause urinary retention Foley catheter removed on 11/11/2021, patient noted to have failed voiding trial and as such Foley catheter placed back in 11/12/2021.  Discontinued bethanechol due to decreased threshold for seizure.  Curb sided urology, Dr.Herrick who recommended voiding trial again tomorrow morning 11/19/2021 and if patient fails  voiding trial Foley catheter to be placed back in and patient to be discharged with Foley catheter in place with outpatient urology follow-up. Urology supposedly to arrange outpatient follow-up with patient.  Acute anemia Hemoglobin now stable at 10.2 today. Known history of essential thrombocytopenia on Hydrea No clinical evidence of bleeding Total of 4 units of packed red blood cells transfused during this hospitalization on POD 0 and 1 MCV suggests a macrocytic anemia however vitamin B12 and folate from 08/2021 are unremarkable.  Monitoring hemoglobin and hematocrit with serial CBCs Hematology following.   Chronic atrial fibrillation with RVR (HCC) Improved rate control Continue home regimen of metoprolol as blood pressure tolerates Monitoring patient on telemetry Was on heparin for dural venous sinus thrombosis, transitioned to Lovenox and subsequently transitioned to Eliquis (11/15/2021).  Leukocytosis Worsening leukocytosis, impressively high during the hospitalization which is trending back down currently at 26.3 from 24.7 from 30.8 from 53.1 from 78.1 (11/14/2021). Cultures negative thus far Infectious disease consultation obtained 10/23 They believed at the time the patient is not suffering from an infectious process and that no further work-up or empiric antibiotics are necessary. Patient received a 3-day course of intravenous Zosyn from 10/19 until 10/21 which was discontinued due to no evidence of infection Peripheral smear on 10/22 unremarkable Patient placed back on hydroxyurea 10/24 by Dr. Marin Olp for leukocytosis which is currently trending back down at 26.3.  Hydroxyurea dose being adjusted. Appreciate hematology/oncology input and recommendations.  S/P total left hip arthroplasty Status post left hip arthroplasty 10/17 with Dr. Alvan Dame Per orthopedic surgery, patient to be weightbearing as tolerated at this point PT, OT evaluations Limited use of opiate-based analgesics  due to ongoing delirium Patient noted with some blotchiness/duskiness left upper thigh however noted to have left leg warm and some tenderness to  palpation and improving daily. Will defer to primary team, orthopedics,??  CT imaging of left thigh?? Per primary team.   Coronary artery disease involving native heart without angina pectoris Continuing metoprolol, statin therapy Patient is currently chest pain-free Monitoring patient on telemetry  Protein-calorie malnutrition, severe (HCC) Poor oral intake with evidence of fat depletion and weight loss consistent with severe protein calorie malnutrition Poor nutritional status is contributing to profound anasarca/third spacing Nutrition following, their input is appreciated Nutritional supplements initiated Multivitamin Patient s/p IV albumin every 6 hours x 24 hours.  Mixed diabetic hyperlipidemia associated with type 2 diabetes mellitus ([place]) Continuing home regimen of lipid lowering therapy.   Essential thrombocythemia ([place]) No significant thrombocytosis at this time Dr. Marin Olp with hematology oncology following, his input is appreciated. Patient initiated on hydroxyurea 10/24 and dose being adjusted.  Diuretic-induced hypokalemia Improved but trending back down likely related to diuresis.  Potassium at 4.1 Repeat labs in the AM.  Hypomagnesemia Magnesium repleted and currently at 1.8.  Repeat labs in the AM.    Acute urinary retention - Patient with history of BPH, noted to have acute urinary retention early on in the hospitalization Foley catheter placed. -Voiding trial done which patient failed. -Foley catheter placed back in.   -Continue Flomax, discontinued bethanechol. -Curb sided urology, Dr. Louis Meckel who recommended repeat voiding trial in the morning and if patient fails Foley catheter to be placed back in with outpatient follow-up with urology.   Outpatient appointment to be arranged for patient on follow-up with  urology.          DVT prophylaxis: Eliquis Code Status: DNR Family Communication: Updated patient.  No family at bedside. Disposition: Either CIR versus home health, per primary team  Status is: Inpatient Remains inpatient appropriate because: Severity of illness   Consultants:  Neurology: Dr. Leonel Ramsay 11/08/2021 Hematology/oncology Dr. Marin Olp 11/05/2021 Gastroenterology: Dr. Bryan Lemma 11/08/2021 Hospitalist Dr. Grandville Silos 11/04/2021 Infectious disease: Dr.Vu 11/10/2021 Curbside urology: Dr.Herrick  Procedures:  EEG 11/12/2021 CT head 11/08/2021 CT abdomen and pelvis 11/11/2021 Chest x-ray 11/05/2021, 11/08/2021, 2023 2023, 11/12/2021 MRI brain 11/08/2021 Renal ultrasound 11/06/2021 Right upper quadrant ultrasound 11/07/2021 2D echo 11/07/2021 Limited echocardiogram 11/09/2021 CT venogram 11/10/2021 4 units PRBCs transfused  Antimicrobials:  Anti-infectives (From admission, onward)    Start     Dose/Rate Route Frequency Ordered Stop   11/06/21 1400  piperacillin-tazobactam (ZOSYN) IVPB 3.375 g  Status:  Discontinued        3.375 g 12.5 mL/hr over 240 Minutes Intravenous Every 8 hours 11/06/21 0722 11/09/21 0842   11/06/21 0815  piperacillin-tazobactam (ZOSYN) IVPB 3.375 g        3.375 g 100 mL/hr over 30 Minutes Intravenous  Once 11/06/21 0720 11/06/21 1548   11/04/21 1800  ceFAZolin (ANCEF) IVPB 2g/100 mL premix        2 g 200 mL/hr over 30 Minutes Intravenous Every 6 hours 11/04/21 1707 11/05/21 0048   11/04/21 0915  ceFAZolin (ANCEF) IVPB 2g/100 mL premix        2 g 200 mL/hr over 30 Minutes Intravenous On call to O.R. 11/04/21 0904 11/04/21 1239         Subjective: Sitting up.  No chest pain.  No shortness of breath.  No abdominal pain.  Overall feeling better.   Objective: Vitals:   11/17/21 2042 11/18/21 0436 11/18/21 0910 11/18/21 1149  BP: 100/66 109/74 112/76 109/66  Pulse: 80 88 86 78  Resp: '17 20  16  '$ Temp: 98 F (36.7 C) 98 F (  36.7  C)  97.7 F (36.5 C)  TempSrc: Oral Oral  Oral  SpO2: 95% 99%  (!) 81%  Weight:      Height:        Intake/Output Summary (Last 24 hours) at 11/18/2021 1710 Last data filed at 11/18/2021 1400 Gross per 24 hour  Intake 240 ml  Output 1350 ml  Net -1110 ml   Filed Weights   11/04/21 0941 11/10/21 1537  Weight: 75.8 kg 68.1 kg    Examination:  General exam: NAD Respiratory system: Decreased breath sounds in the bases otherwise clear.  No wheezes, no crackles, no rhonchi fair air movement.  Speaking in full sentences.  Cardiovascular system: Irregularly irregular.  No JVD.  No murmurs rubs or gallops.  Trace bilateral lower extremity edema.   Gastrointestinal system: Abdomen is soft, nontender, nondistended, positive bowel sounds.  No rebound.  No guarding.   Central nervous system: Alert and oriented. No focal neurological deficits. Extremities: Left thigh with less blotchiness and less duskiness noted with some slight improvement over the past 24 hours..  Postop dressing has been removed per orthopedics. Left foot and thigh warm.  Some tenderness to palpation in the left thigh. Skin: Large ecchymotic area noted in the left flank regressing.  Psychiatry: Judgement and insight appear fair. Mood & affect appropriate.     Data Reviewed:   CBC: Recent Labs  Lab 11/14/21 0412 11/15/21 0316 11/16/21 0245 11/17/21 0446 11/18/21 0513  WBC 70.1* 53.1* 30.8* 24.7* 26.3*  HGB 10.5* 10.4* 9.8* 10.4* 10.2*  HCT 33.3* 32.1* 30.6* 32.4* 32.1*  MCV 111.4* 108.8* 110.9* 109.5* 109.2*  PLT 190 206 191 205 237    Basic Metabolic Panel: Recent Labs  Lab 11/12/21 0257 11/13/21 0047 11/14/21 0412 11/15/21 0316 11/16/21 0245 11/17/21 0446 11/18/21 0513  NA 136 135 133* 132* 131* 131* 132*  K 3.5 3.8 3.9 3.4* 3.5 3.8 4.1  CL 102 102 101 99 100 100 101  CO2 25 24 20* '25 23 23 23  '$ GLUCOSE 115* 135* 91 100* 84 106* 92  BUN 21 24* 31* 38* 39* 37* 39*  CREATININE 1.10 1.15 1.51*  1.72* 1.45* 1.33* 1.54*  CALCIUM 7.7* 8.0* 7.6* 7.6* 7.6* 8.0* 7.9*  MG 1.6* 2.3 2.1 1.9  --  1.8  --   PHOS  --   --   --   --  4.0 4.1  --     GFR: Estimated Creatinine Clearance: 42 mL/min (A) (by C-G formula based on SCr of 1.54 mg/dL (H)).  Liver Function Tests: Recent Labs  Lab 11/12/21 0257 11/15/21 0316 11/16/21 0245 11/17/21 0446  AST 28  --   --   --   ALT 11  --   --   --   ALKPHOS 118  --   --   --   BILITOT 2.1*  --   --   --   PROT 5.2*  --   --   --   ALBUMIN 2.4* 2.8* 3.0* 3.0*    CBG: No results for input(s): "GLUCAP" in the last 168 hours.    Recent Results (from the past 240 hour(s))  MRSA Next Gen by PCR, Nasal     Status: None   Collection Time: 11/10/21  3:48 PM   Specimen: Nasal Mucosa; Nasal Swab  Result Value Ref Range Status   MRSA by PCR Next Gen NOT DETECTED NOT DETECTED Final    Comment: (NOTE) The GeneXpert MRSA Assay (FDA approved for NASAL specimens  only), is one component of a comprehensive MRSA colonization surveillance program. It is not intended to diagnose MRSA infection nor to guide or monitor treatment for MRSA infections. Test performance is not FDA approved in patients less than 34 years old. Performed at [facility], [place] [address], [place], [place] [address]          Radiology Studies: No results found.      Scheduled Meds:  apixaban  5 mg Oral BID   Chlorhexidine Gluconate Cloth  6 each Topical Daily   feeding supplement  237 mL Oral BID BM   hydroxyurea  500 mg Oral Daily   loratadine  10 mg Oral Daily   metoprolol tartrate  25 mg Oral BID   multivitamin with minerals  1 tablet Oral Daily   pantoprazole  40 mg Oral BID   polyethylene glycol  17 g Oral Daily   rosuvastatin  20 mg Oral Daily   tamsulosin  0.4 mg Oral Daily   thiamine  100 mg Oral Daily   Continuous Infusions:  sodium chloride Stopped (11/11/21 1130)   methocarbamol (ROBAXIN) IV       LOS: 13 days    Time  spent: 40 minutes    Irine Seal, MD [facility]   To contact the attending provider between 7A-7P or the covering provider during after hours 7P-7A, please log into the web site [email or web address] and access using universal [place] password for that web site. If you do not have the password, please call the hospital operator.  11/18/2021, 5:10 PM

## 2021-11-18 NOTE — Progress Notes (Signed)
Physical Therapy Treatment Patient Details Name: Justin [name] MRN: [id number] DOB: 1955-06-12 Today's Date: 11/18/2021   History of Present Illness 66 y.o. male s/p Left THA on 11/04/21.  PMHx: A.Fib on Eliquis, CAD s/p CABG, AS s/p AVR with bioprosthetic, s/p CEA, DM2, HTN, essential thrombocytosis/JAK2+ chronic leukocytosis, course complicated by  dural venous sinus thrombosis post op.    PT Comments    POD # 14 Pt continue to progress with his mobility.  Still c/o some dizziness with activity.  Vitals all WNL.  Cause most likely by  dural venous sinus thrombosis.  Tolerated an increased amb in hallway.  Using RW vs EVA walker.  Amb twice then returned to room as lunch tray arrived. Pt plans to D/C to Inpt Rehab.   Recommendations for follow up therapy are one component of a multi-disciplinary discharge planning process, led by the attending physician.  Recommendations may be updated based on patient status, additional functional criteria and insurance authorization.  Follow Up Recommendations  Acute inpatient rehab (3hours/day)     Assistance Recommended at Discharge Frequent or constant Supervision/Assistance  Patient can return home with the following Help with stairs or ramp for entrance;A lot of help with walking and/or transfers;A little help with bathing/dressing/bathroom;Assistance with cooking/housework;Assist for transportation   Equipment Recommendations       Recommendations for Other Services       Precautions / Restrictions Precautions Precautions: Fall Precaution Comments: monitor vitals Restrictions Weight Bearing Restrictions: No LLE Weight Bearing: Weight bearing as tolerated     Mobility  Bed Mobility               General bed mobility comments: OOB in recliner    Transfers Overall transfer level: Needs assistance Equipment used: Rolling walker (2 wheels) Transfers: Sit to/from Stand, Bed to chair/wheelchair/BSC Sit to Stand: Min  assist           General transfer comment: vc and tactile cues each time for safe hand placement    Ambulation/Gait Ambulation/Gait assistance: Min assist, Mod assist, +2 physical assistance, +2 safety/equipment Gait Distance (Feet): 65 Feet Assistive device: Rolling walker (2 wheels) Gait Pattern/deviations: Step-to pattern, Decreased stance time - left, Antalgic Gait velocity: decreased     General Gait Details: tolerated using RW for 65 feet +2 for recliner following. Required one seated rest break due to c/o dizziness.  Vitals WNL.   Stairs             Wheelchair Mobility    Modified Rankin (Stroke Patients Only)       Balance                                            Cognition Arousal/Alertness: Awake/alert Behavior During Therapy: WFL for tasks assessed/performed, Flat affect Overall Cognitive Status: Within Functional Limits for tasks assessed Area of Impairment: Attention, Awareness, Memory                               General Comments: Cognition was delayed in thoughts and responses. vc's and tactile cues needed for safe hand placement with every transfer.        Exercises      General Comments        Pertinent Vitals/Pain Pain Assessment Pain Assessment: Faces Faces Pain Scale: Hurts a little bit Pain Location: L  hip Pain Descriptors / Indicators: Tightness, Sore Pain Intervention(s): Monitored during session, Premedicated before session, Repositioned    Home Living                          Prior Function            PT Goals (current goals can now be found in the care plan section) Progress towards PT goals: Progressing toward goals    Frequency    7X/week      PT Plan Current plan remains appropriate    Co-evaluation              AM-PAC PT "6 Clicks" Mobility   Outcome Measure  Help needed turning from your back to your side while in a flat bed without using bedrails?:  A Lot Help needed moving from lying on your back to sitting on the side of a flat bed without using bedrails?: A Lot Help needed moving to and from a bed to a chair (including a wheelchair)?: A Lot Help needed standing up from a chair using your arms (e.g., wheelchair or bedside chair)?: A Lot Help needed to walk in hospital room?: A Lot Help needed climbing 3-5 steps with a railing? : A Lot 6 Click Score: 12    End of Session Equipment Utilized During Treatment: Gait belt Activity Tolerance: Patient tolerated treatment well Patient left: in chair;with call bell/phone within reach;with chair alarm set Nurse Communication: Mobility status PT Visit Diagnosis: Other abnormalities of gait and mobility (R26.89)     Time: 1110-1144 PT Time Calculation (min) (ACUTE ONLY): 34 min  Charges:  $Gait Training: 8-22 mins $Therapeutic Activity: 8-22 mins                     [name]  PTA [facility] M-F          [phone] Weekend pager [phone]

## 2021-11-18 NOTE — Progress Notes (Signed)
Patient ID: Justin [name], male   DOB: 11-29-1955, 66 y.o.   MRN: [id number] Subjective: 14 Days Post-Op Procedure(s) (LRB): TOTAL HIP ARTHROPLASTY ANTERIOR APPROACH (Left)    Patient reports pain as mild. No events over night Still with foley catheter - indications reviewed however time frame of management unknown with regards to another trial vs out patient follow up with Urology (? If he has one)  Objective:   VITALS:   Vitals:   11/17/21 2042 11/18/21 0436  BP: 100/66 109/74  Pulse: 80 88  Resp: 17 20  Temp: 98 F (36.7 C) 98 F (36.7 C)  SpO2: 95% 99%    Neurovascular intact  I removed his dressing today No drainage, no signs of infection Left swelling normal for post op THR, plus all of the chemoprophylaxis  LABS Recent Labs    11/16/21 0245 11/17/21 0446 11/18/21 0513  HGB 9.8* 10.4* 10.2*  HCT 30.6* 32.4* 32.1*  WBC 30.8* 24.7* 26.3*  PLT 191 205 197    Recent Labs    11/16/21 0245 11/17/21 0446 11/18/21 0513  NA 131* 131* 132*  K 3.5 3.8 4.1  BUN 39* 37* 39*  CREATININE 1.45* 1.33* 1.54*  GLUCOSE 84 106* 92    No results for input(s): "LABPT", "INR" in the last 72 hours.   Assessment/Plan: 14 Days Post-Op Procedure(s) (LRB): TOTAL HIP ARTHROPLASTY ANTERIOR APPROACH (Left)   Up with therapy Need to progress to normal/baseline Continue to work towards a disposition Wearing Prevalon boot due to an unclassified pressure sore otherwise elevate off bed if boot not worn Off when ambulating Urinary retention and recommendations for management in setting of CHF and pleural effusions and renal status per medical team  Care and management of Mr Justin [name] and the medical complexity appreciated

## 2021-11-18 NOTE — Progress Notes (Signed)
Inpatient Rehab Admissions Coordinator:   Spoke with patient's wife, [name] by phone who reports she will be available to assist patient 24/7 upon his discharge and she is interested in pursuing CIR for patient. Case opened with patient's insurance and will await decision for potential CIR admission later this week if patient medically stable and bed available.  Will continue to follow.   Rehab Admissons Coordinator [address] [place], [place], [place] [phone]

## 2021-11-19 ENCOUNTER — Inpatient Hospital Stay (HOSPITAL_COMMUNITY): Payer: Medicare Other

## 2021-11-19 ENCOUNTER — Encounter (HOSPITAL_COMMUNITY): Payer: Self-pay | Admitting: Physical Medicine & Rehabilitation

## 2021-11-19 ENCOUNTER — Inpatient Hospital Stay (HOSPITAL_COMMUNITY)
Admission: RE | Admit: 2021-11-19 | Discharge: 2021-11-30 | DRG: 945 | Disposition: A | Discharge status: Home Health | Payer: Medicare Other | Source: Other Acute Inpatient Hospital | Attending: Physical Medicine & Rehabilitation | Admitting: Physical Medicine & Rehabilitation

## 2021-11-19 ENCOUNTER — Other Ambulatory Visit: Payer: Self-pay

## 2021-11-19 DIAGNOSIS — M1612 Unilateral primary osteoarthritis, left hip: Secondary | ICD-10-CM | POA: Diagnosis not present

## 2021-11-19 DIAGNOSIS — R338 Other retention of urine: Secondary | ICD-10-CM | POA: Diagnosis present

## 2021-11-19 DIAGNOSIS — I5023 Acute on chronic systolic (congestive) heart failure: Secondary | ICD-10-CM | POA: Diagnosis present

## 2021-11-19 DIAGNOSIS — Z951 Presence of aortocoronary bypass graft: Secondary | ICD-10-CM

## 2021-11-19 DIAGNOSIS — E440 Moderate protein-calorie malnutrition: Secondary | ICD-10-CM | POA: Diagnosis present

## 2021-11-19 DIAGNOSIS — K219 Gastro-esophageal reflux disease without esophagitis: Secondary | ICD-10-CM | POA: Diagnosis present

## 2021-11-19 DIAGNOSIS — E1151 Type 2 diabetes mellitus with diabetic peripheral angiopathy without gangrene: Secondary | ICD-10-CM | POA: Diagnosis present

## 2021-11-19 DIAGNOSIS — I2510 Atherosclerotic heart disease of native coronary artery without angina pectoris: Secondary | ICD-10-CM | POA: Diagnosis present

## 2021-11-19 DIAGNOSIS — F5104 Psychophysiologic insomnia: Secondary | ICD-10-CM | POA: Diagnosis present

## 2021-11-19 DIAGNOSIS — G08 Intracranial and intraspinal phlebitis and thrombophlebitis: Secondary | ICD-10-CM

## 2021-11-19 DIAGNOSIS — I110 Hypertensive heart disease with heart failure: Secondary | ICD-10-CM | POA: Diagnosis not present

## 2021-11-19 DIAGNOSIS — L89152 Pressure ulcer of sacral region, stage 2: Secondary | ICD-10-CM | POA: Diagnosis present

## 2021-11-19 DIAGNOSIS — L03116 Cellulitis of left lower limb: Secondary | ICD-10-CM | POA: Diagnosis present

## 2021-11-19 DIAGNOSIS — D693 Immune thrombocytopenic purpura: Secondary | ICD-10-CM | POA: Diagnosis not present

## 2021-11-19 DIAGNOSIS — Z96643 Presence of artificial hip joint, bilateral: Secondary | ICD-10-CM | POA: Diagnosis not present

## 2021-11-19 DIAGNOSIS — N401 Enlarged prostate with lower urinary tract symptoms: Secondary | ICD-10-CM | POA: Diagnosis not present

## 2021-11-19 DIAGNOSIS — G9341 Metabolic encephalopathy: Secondary | ICD-10-CM | POA: Diagnosis present

## 2021-11-19 DIAGNOSIS — E1169 Type 2 diabetes mellitus with other specified complication: Secondary | ICD-10-CM | POA: Diagnosis present

## 2021-11-19 DIAGNOSIS — I482 Chronic atrial fibrillation, unspecified: Secondary | ICD-10-CM | POA: Diagnosis not present

## 2021-11-19 DIAGNOSIS — G8929 Other chronic pain: Secondary | ICD-10-CM | POA: Diagnosis present

## 2021-11-19 DIAGNOSIS — I4891 Unspecified atrial fibrillation: Secondary | ICD-10-CM | POA: Diagnosis present

## 2021-11-19 DIAGNOSIS — E782 Mixed hyperlipidemia: Secondary | ICD-10-CM | POA: Diagnosis not present

## 2021-11-19 DIAGNOSIS — Z87891 Personal history of nicotine dependence: Secondary | ICD-10-CM | POA: Diagnosis not present

## 2021-11-19 DIAGNOSIS — L89620 Pressure ulcer of left heel, unstageable: Secondary | ICD-10-CM | POA: Diagnosis present

## 2021-11-19 DIAGNOSIS — Z7982 Long term (current) use of aspirin: Secondary | ICD-10-CM

## 2021-11-19 DIAGNOSIS — Z96642 Presence of left artificial hip joint: Secondary | ICD-10-CM | POA: Diagnosis not present

## 2021-11-19 DIAGNOSIS — Z7901 Long term (current) use of anticoagulants: Secondary | ICD-10-CM

## 2021-11-19 DIAGNOSIS — D473 Essential (hemorrhagic) thrombocythemia: Secondary | ICD-10-CM | POA: Diagnosis not present

## 2021-11-19 DIAGNOSIS — R5381 Other malaise: Secondary | ICD-10-CM | POA: Diagnosis not present

## 2021-11-19 DIAGNOSIS — Z79899 Other long term (current) drug therapy: Secondary | ICD-10-CM

## 2021-11-19 DIAGNOSIS — L89622 Pressure ulcer of left heel, stage 2: Secondary | ICD-10-CM | POA: Diagnosis not present

## 2021-11-19 DIAGNOSIS — R339 Retention of urine, unspecified: Secondary | ICD-10-CM | POA: Diagnosis not present

## 2021-11-19 DIAGNOSIS — L8990 Pressure ulcer of unspecified site, unspecified stage: Secondary | ICD-10-CM | POA: Insufficient documentation

## 2021-11-19 DIAGNOSIS — Z981 Arthrodesis status: Secondary | ICD-10-CM

## 2021-11-19 DIAGNOSIS — Z952 Presence of prosthetic heart valve: Secondary | ICD-10-CM | POA: Diagnosis not present

## 2021-11-19 DIAGNOSIS — E876 Hypokalemia: Secondary | ICD-10-CM | POA: Diagnosis not present

## 2021-11-19 DIAGNOSIS — Z9181 History of falling: Secondary | ICD-10-CM | POA: Diagnosis not present

## 2021-11-19 DIAGNOSIS — R040 Epistaxis: Secondary | ICD-10-CM | POA: Diagnosis not present

## 2021-11-19 DIAGNOSIS — D62 Acute posthemorrhagic anemia: Secondary | ICD-10-CM | POA: Diagnosis present

## 2021-11-19 DIAGNOSIS — Z8052 Family history of malignant neoplasm of bladder: Secondary | ICD-10-CM

## 2021-11-19 DIAGNOSIS — I5041 Acute combined systolic (congestive) and diastolic (congestive) heart failure: Secondary | ICD-10-CM | POA: Diagnosis not present

## 2021-11-19 DIAGNOSIS — Z6824 Body mass index (BMI) 24.0-24.9, adult: Secondary | ICD-10-CM

## 2021-11-19 LAB — BASIC METABOLIC PANEL
Anion gap: 8 (ref 5–15)
BUN: 33 mg/dL — ABNORMAL HIGH (ref 8–23)
CO2: 23 mmol/L (ref 22–32)
Calcium: 8.2 mg/dL — ABNORMAL LOW (ref 8.9–10.3)
Chloride: 103 mmol/L (ref 98–111)
Creatinine, Ser: 1.25 mg/dL — ABNORMAL HIGH (ref 0.61–1.24)
GFR, Estimated: >60 mL/min (ref >60)
Glucose, Bld: 98 mg/dL (ref 70–99)
Potassium: 4.4 mmol/L (ref 3.5–5.1)
Sodium: 134 mmol/L — ABNORMAL LOW (ref 135–145)

## 2021-11-19 LAB — CBC
HCT: 32.6 % — ABNORMAL LOW (ref 39.0–52.0)
Hemoglobin: 10.2 g/dL — ABNORMAL LOW (ref 13.0–17.0)
MCH: 35.1 pg — ABNORMAL HIGH (ref 26.0–34.0)
MCHC: 31.3 g/dL (ref 30.0–36.0)
MCV: 112 fL — ABNORMAL HIGH (ref 80.0–100.0)
Platelets: 185 10*3/uL (ref 150–400)
RBC: 2.91 MIL/uL — ABNORMAL LOW (ref 4.22–5.81)
RDW: 26.3 % — ABNORMAL HIGH (ref 11.5–15.5)
WBC: 32.2 10*3/uL — ABNORMAL HIGH (ref 4.0–10.5)
nRBC: 0.5 % — ABNORMAL HIGH (ref 0.0–0.2)

## 2021-11-19 LAB — MAGNESIUM: Magnesium: 1.5 mg/dL — ABNORMAL LOW (ref 1.7–2.4)

## 2021-11-19 LAB — PROTHROMBIN GENE MUTATION

## 2021-11-19 MED ORDER — POLYETHYLENE GLYCOL 3350 17 G PO PACK
17.0000 g | PACK | Freq: Every day | ORAL | Status: DC
  Administered 2021-11-21 – 2021-11-29 (×9): 17 g via ORAL
  Filled 2021-11-19 (×11): qty 1

## 2021-11-19 MED ORDER — OXYCODONE HCL 5 MG PO TABS
5.0000 mg | ORAL_TABLET | ORAL | Status: DC | PRN
  Administered 2021-11-20 – 2021-11-30 (×14): 5 mg via ORAL
  Filled 2021-11-19 (×14): qty 1

## 2021-11-19 MED ORDER — MAGNESIUM SULFATE 2 GM/50ML IV SOLN
2.0000 g | Freq: Once | INTRAVENOUS | Status: AC
  Administered 2021-11-19: 2 g via INTRAVENOUS
  Filled 2021-11-19: qty 50

## 2021-11-19 MED ORDER — ALBUTEROL SULFATE (2.5 MG/3ML) 0.083% IN NEBU: 2.5000 mg | INHALATION_SOLUTION | RESPIRATORY_TRACT | Status: DC | PRN

## 2021-11-19 MED ORDER — ROSUVASTATIN CALCIUM 20 MG PO TABS
20.0000 mg | ORAL_TABLET | Freq: Every day | ORAL | Status: DC
  Administered 2021-11-20 – 2021-11-30 (×11): 20 mg via ORAL
  Filled 2021-11-19 (×11): qty 1

## 2021-11-19 MED ORDER — APIXABAN 5 MG PO TABS
5.0000 mg | ORAL_TABLET | Freq: Two times a day (BID) | ORAL | Status: DC
  Administered 2021-11-19 – 2021-11-28 (×18): 5 mg via ORAL
  Filled 2021-11-19 (×19): qty 1

## 2021-11-19 MED ORDER — METHOCARBAMOL 1000 MG/10ML IJ SOLN: 500.0000 mg | Freq: Four times a day (QID) | INTRAVENOUS | Status: DC | PRN

## 2021-11-19 MED ORDER — PANTOPRAZOLE SODIUM 40 MG PO TBEC
40.0000 mg | DELAYED_RELEASE_TABLET | Freq: Two times a day (BID) | ORAL | Status: DC
  Administered 2021-11-19 – 2021-11-30 (×22): 40 mg via ORAL
  Filled 2021-11-19 (×22): qty 1

## 2021-11-19 MED ORDER — METOPROLOL TARTRATE 25 MG PO TABS
25.0000 mg | ORAL_TABLET | Freq: Two times a day (BID) | ORAL | Status: DC
  Administered 2021-11-19 – 2021-11-30 (×22): 25 mg via ORAL
  Filled 2021-11-19 (×22): qty 1

## 2021-11-19 MED ORDER — CEPHALEXIN 250 MG PO CAPS
500.0000 mg | ORAL_CAPSULE | Freq: Two times a day (BID) | ORAL | Status: AC
  Administered 2021-11-19 – 2021-11-24 (×10): 500 mg via ORAL
  Filled 2021-11-19 (×10): qty 2

## 2021-11-19 MED ORDER — THIAMINE MONONITRATE 100 MG PO TABS
100.0000 mg | ORAL_TABLET | Freq: Every day | ORAL | Status: DC
  Administered 2021-11-20 – 2021-11-30 (×11): 100 mg via ORAL
  Filled 2021-11-19 (×11): qty 1

## 2021-11-19 MED ORDER — CEPHALEXIN 500 MG PO CAPS
500.0000 mg | ORAL_CAPSULE | Freq: Two times a day (BID) | ORAL | Status: DC
  Administered 2021-11-19: 500 mg via ORAL
  Filled 2021-11-19: qty 1

## 2021-11-19 MED ORDER — ONDANSETRON HCL 4 MG PO TABS: 4.0000 mg | ORAL_TABLET | Freq: Four times a day (QID) | ORAL | Status: DC | PRN

## 2021-11-19 MED ORDER — ZINC OXIDE 40 % EX OINT: TOPICAL_OINTMENT | CUTANEOUS | Status: DC | PRN

## 2021-11-19 MED ORDER — LORATADINE 10 MG PO TABS
10.0000 mg | ORAL_TABLET | Freq: Every day | ORAL | Status: DC
  Administered 2021-11-20 – 2021-11-30 (×11): 10 mg via ORAL
  Filled 2021-11-19 (×11): qty 1

## 2021-11-19 MED ORDER — ENSURE ENLIVE PO LIQD: 237.0000 mL | Freq: Two times a day (BID) | ORAL | Status: DC

## 2021-11-19 MED ORDER — METHOCARBAMOL 500 MG PO TABS: 500.0000 mg | ORAL_TABLET | Freq: Four times a day (QID) | ORAL | 2 refills | Status: DC | PRN

## 2021-11-19 MED ORDER — ACETAMINOPHEN 325 MG PO TABS
325.0000 mg | ORAL_TABLET | Freq: Four times a day (QID) | ORAL | Status: DC | PRN
  Administered 2021-11-19 – 2021-11-29 (×6): 650 mg via ORAL
  Filled 2021-11-19 (×6): qty 2

## 2021-11-19 MED ORDER — ADULT MULTIVITAMIN W/MINERALS CH
1.0000 | ORAL_TABLET | Freq: Every day | ORAL | Status: DC
  Administered 2021-11-20 – 2021-11-30 (×11): 1 via ORAL
  Filled 2021-11-19 (×12): qty 1

## 2021-11-19 MED ORDER — METHOCARBAMOL 500 MG PO TABS
500.0000 mg | ORAL_TABLET | Freq: Four times a day (QID) | ORAL | Status: DC | PRN
  Administered 2021-11-21 – 2021-11-24 (×3): 500 mg via ORAL
  Filled 2021-11-19 (×3): qty 1

## 2021-11-19 MED ORDER — ONDANSETRON HCL 4 MG/2ML IJ SOLN: 4.0000 mg | Freq: Four times a day (QID) | INTRAMUSCULAR | Status: DC | PRN

## 2021-11-19 MED ORDER — BISACODYL 10 MG RE SUPP
10.0000 mg | Freq: Every day | RECTAL | Status: DC | PRN
  Administered 2021-11-21: 10 mg via RECTAL
  Filled 2021-11-19: qty 1

## 2021-11-19 MED ORDER — TAMSULOSIN HCL 0.4 MG PO CAPS
0.4000 mg | ORAL_CAPSULE | Freq: Every day | ORAL | Status: DC
  Administered 2021-11-20 – 2021-11-30 (×11): 0.4 mg via ORAL
  Filled 2021-11-19 (×11): qty 1

## 2021-11-19 MED ORDER — NITROGLYCERIN 0.4 MG SL SUBL: 0.4000 mg | SUBLINGUAL_TABLET | SUBLINGUAL | Status: DC | PRN

## 2021-11-19 MED ORDER — HYDROXYUREA 500 MG PO CAPS
500.0000 mg | ORAL_CAPSULE | Freq: Two times a day (BID) | ORAL | Status: DC
  Administered 2021-11-19 – 2021-11-29 (×21): 500 mg via ORAL
  Filled 2021-11-19 (×23): qty 1

## 2021-11-19 MED ORDER — HYDROXYUREA 500 MG PO CAPS
500.0000 mg | ORAL_CAPSULE | Freq: Two times a day (BID) | ORAL | Status: DC
  Administered 2021-11-19: 500 mg via ORAL
  Filled 2021-11-19: qty 1

## 2021-11-19 NOTE — Discharge Summary (Signed)
Physician Discharge Summary   Patient ID: Justin [name] MRN: [id number] DOB/AGE: 09-22-1955 66 y.o.  Admit date: 11/04/2021 Discharge date: 11/19/21  Primary Diagnosis: Left hip osteoarthritis.   Admission Diagnoses:  Past Medical History:  Diagnosis Date   Acute meniscal tear of knee LEFT   Anemia    Aortic stenosis 12/01/2017   NONRHEUMATIC, AORTIC VALVE CALCIFICATIONS, MILD TO MODERATE REGURG, MILD TO MODERATE CALCIFIED ANNULUS per ECHO 10/25/17 @ [facility] [address]   Arthritis    Atrial fibrillation ([place]) 11/12/2017   AT O/V WITH PCP   Cancer ([place])    skin right arm   Coronary artery disease    Dyspnea    GERD (gastroesophageal reflux disease)    Headache    hx of migraines   Heart murmur MILD-- ASYMPTOMATIC   History of kidney stones    Hyperlipidemia    Hypertension    Left knee pain    Mixed diabetic hyperlipidemia associated with type 2 diabetes mellitus ([place]) 11/05/2021   PAD (peripheral artery disease) (HCC)    left leg claudication   Sleep apnea    Discharge Diagnoses:   Principal Problem:   Dural venous sinus thrombosis Active Problems:   Leukocytosis   Chronic atrial fibrillation with RVR (HCC)   Essential thrombocythemia (HCC)   Coronary artery disease involving native heart without angina pectoris   Acute anemia   AKI (acute kidney injury) ([place])   Acute urinary retention   Mixed hyperlipidemia   S/P total left hip arthroplasty   Acute postoperative anemia due to expected blood loss   Mixed diabetic hyperlipidemia associated with type 2 diabetes mellitus (HCC)   Hypomagnesemia   Acute metabolic encephalopathy   Acute on chronic systolic CHF (congestive heart failure) (HCC)   Protein-calorie malnutrition, severe (HCC)   Elevated INR   BPH with obstruction/lower urinary tract symptoms   Diuretic-induced hypokalemia   Malnutrition of moderate degree  Estimated body mass index is 24.23 kg/m as calculated from the following:   Height as of  this encounter: '5\' 6"'$  (1.676 m).   Weight as of this encounter: 68.1 kg.  Procedure:  Procedure(s) (LRB): TOTAL HIP ARTHROPLASTY ANTERIOR APPROACH (Left)   Consults: pulmonary/intensive care, GI, hematology/oncology, neurology, and urology  HPI: Justin [name] is a 66 y.o. male who had   presented to office for evaluation of left hip pain.  Radiographs revealed   progressive degenerative changes with bone-on-bone   articulation of the  hip joint, including subchondral cystic changes and osteophytes.  The patient had painful limited range of   motion significantly affecting their overall quality of life and function.  The patient was failing to    respond to conservative measures including medications and/or injections and activity modification and at this point was ready   to proceed with more definitive measures.  Consent was obtained for   benefit of pain relief.  Specific risks of infection, DVT, component   failure, dislocation, neurovascular injury, and need for revision surgery were reviewed in the office as well discussion of   the anterior versus posterior approach were reviewed.  Laboratory Data: No results displayed because visit has over 200 results.    Appointment on 11/03/2021  Component Date Value Ref Range Status   WBC Count 11/03/2021 22.3 (H)  4.0 - 10.5 K/uL Final   RBC 11/03/2021 3.25 (L)  4.22 - 5.81 MIL/uL Final   Hemoglobin 11/03/2021 11.8 (L)  13.0 - 17.0 g/dL Final   HCT 11/03/2021 35.8 (L)  39.0 - 52.0 % Final   MCV 11/03/2021 110.2 (H)  80.0 - 100.0 fL Final   MCH 11/03/2021 36.3 (H)  26.0 - 34.0 pg Final   MCHC 11/03/2021 33.0  30.0 - 36.0 g/dL Final   RDW 11/03/2021 25.2 (H)  11.5 - 15.5 % Final   Platelet Count 11/03/2021 203  150 - 400 K/uL Final   nRBC 11/03/2021 0.6 (H)  0.0 - 0.2 % Final   Neutrophils Relative % 11/03/2021 85  % Final   Neutro Abs 11/03/2021 19.0 (H)  1.7 - 7.7 K/uL Final   Lymphocytes Relative 11/03/2021 8  % Final   Lymphs  Abs 11/03/2021 1.8  0.7 - 4.0 K/uL Final   Monocytes Relative 11/03/2021 5  % Final   Monocytes Absolute 11/03/2021 1.1 (H)  0.1 - 1.0 K/uL Final   Eosinophils Relative 11/03/2021 0  % Final   Eosinophils Absolute 11/03/2021 0.0  0.0 - 0.5 K/uL Final   Basophils Relative 11/03/2021 2  % Final   Basophils Absolute 11/03/2021 0.4 (H)  0.0 - 0.1 K/uL Final   Smear Review 11/03/2021 LARGE PLATELETS PRESENT   Final   Abs Immature Granulocytes 11/03/2021 0.00  0.00 - 0.07 K/uL Final   Hypersegmented Neutrophils 11/03/2021 PRESENT   Final   Tear Drop Cells 11/03/2021 PRESENT   Final   Ovalocytes 11/03/2021 PRESENT   Final   Performed at [facility] at [facility], [address], [place], [place] [address]   Sodium 11/03/2021 144  135 - 145 mmol/L Final   Potassium 11/03/2021 4.0  3.5 - 5.1 mmol/L Final   Chloride 11/03/2021 108  98 - 111 mmol/L Final   CO2 11/03/2021 25  22 - 32 mmol/L Final   Glucose, Bld 11/03/2021 126 (H)  70 - 99 mg/dL Final   Glucose reference range applies only to samples taken after fasting for at least 8 hours.   BUN 11/03/2021 14  8 - 23 mg/dL Final   Creatinine 11/03/2021 1.27 (H)  0.61 - 1.24 mg/dL Final   Calcium 11/03/2021 9.8  8.9 - 10.3 mg/dL Final   Total Protein 11/03/2021 7.3  6.5 - 8.1 g/dL Final   Albumin 11/03/2021 3.6  3.5 - 5.0 g/dL Final   AST 11/03/2021 18  15 - 41 U/L Final   ALT 11/03/2021 9  0 - 44 U/L Final   Alkaline Phosphatase 11/03/2021 102  38 - 126 U/L Final   Total Bilirubin 11/03/2021 1.6 (H)  0.3 - 1.2 mg/dL Final   GFR, Estimated 11/03/2021 >60  >60 mL/min Final   Comment: (NOTE) Calculated using the CKD-EPI Creatinine Equation (2021)    Anion gap 11/03/2021 11  5 - 15 Final   Performed at [facility] at [facility], [address], [place], [place] [address]   LDH 11/03/2021 334 (H)  98 - 192 U/L Final   Performed at [facility] Lab at [facility], [place], [place], [place] [address]   Smear Review 11/03/2021 SMEAR STAINED AND AVAILABLE FOR REVIEW   Final   Performed at [facility] at [facility], [address], [place], [place] [address]   Ferritin 11/03/2021 110  24 - 336 ng/mL Final   Performed at [facility], [address], [place], [place] [address]   Retic Ct Pct 11/03/2021 2.3  0.4 - 3.1 % Final   RBC.  11/03/2021 3.28 (L)  4.22 - 5.81 MIL/uL Final   Retic Count, Absolute 11/03/2021 74.8  19.0 - 186.0 K/uL Final   Immature Retic Fract 11/03/2021 36.0 (H)  2.3 - 15.9 % Final   Performed at [facility] at [facility], [place], [place], [place] [address]   Iron 11/03/2021 35 (L)  45 - 182 ug/dL Final   TIBC 11/03/2021 242 (L)  250 - 450 ug/dL Final   Saturation Ratios 11/03/2021 15 (L)  17.9 - 39.5 % Final   UIBC 11/03/2021 207  117 - 376 ug/dL Final   Performed at [facility], [place] [address], [place], [place] [address]  Hospital Outpatient Visit on 10/29/2021  Component Date Value Ref Range Status   Sodium 10/29/2021 138  135 - 145 mmol/L Final   Potassium 10/29/2021 3.3 (L)  3.5 - 5.1 mmol/L Final   Chloride 10/29/2021 107  98 - 111 mmol/L Final   CO2 10/29/2021 23  22 - 32 mmol/L Final   Glucose, Bld 10/29/2021 114 (H)  70 - 99 mg/dL Final   Glucose reference range applies only to samples taken after fasting for at least 8 hours.   BUN 10/29/2021 17  8 - 23 mg/dL Final   Creatinine, Ser 10/29/2021 1.50 (H)  0.61 - 1.24 mg/dL Final   Calcium 10/29/2021 8.5 (L)  8.9 - 10.3 mg/dL Final   GFR, Estimated 10/29/2021 51 (L)  >60 mL/min Final   Comment: (NOTE) Calculated using the CKD-EPI Creatinine Equation (2021)    Anion gap 10/29/2021 8  5 - 15 Final   Performed at [facility], [place] [address], [place], [place] [address]   Hgb A1c MFr Bld 10/29/2021 5.1  4.8 - 5.6 % Final   Comment: (NOTE) Pre  diabetes:          5.7%-6.4%  Diabetes:              >6.4%  Glycemic control for   <7.0% adults with diabetes    Mean Plasma Glucose 10/29/2021 99.67  mg/dL Final   Performed at [facility], [place] [address] [place], [place] [address]   WBC 10/29/2021 17.4 (H)  4.0 - 10.5 K/uL Final   RBC 10/29/2021 3.12 (L)  4.22 - 5.81 MIL/uL Final   Hemoglobin 10/29/2021 11.0 (L)  13.0 - 17.0 g/dL Final   HCT 10/29/2021 34.2 (L)  39.0 - 52.0 % Final   MCV 10/29/2021 109.6 (H)  80.0 - 100.0 fL Final   MCH 10/29/2021 35.3 (H)  26.0 - 34.0 pg Final   MCHC 10/29/2021 32.2  30.0 - 36.0 g/dL Final   RDW 10/29/2021 24.9 (H)  11.5 - 15.5 % Final   Platelets 10/29/2021 169  150 - 400 K/uL Final   nRBC 10/29/2021 0.3 (H)  0.0 - 0.2 % Final   Performed at [facility], [place] [address] [place], [place] [address]   MRSA, PCR 10/29/2021 NEGATIVE  NEGATIVE Final   Staphylococcus aureus 10/29/2021 POSITIVE (A)  NEGATIVE Final   Comment: (NOTE) The Xpert SA Assay (FDA approved for NASAL specimens in patients 86 years of age and older), is one component of a comprehensive surveillance program. It is not intended to diagnose infection nor to guide or monitor treatment. Performed at [facility], [place] [address] [place], [place] [address]    Glucose-Capillary 10/29/2021 115 (H)  70 - 99 mg/dL Final   Glucose  reference range applies only to samples taken after fasting for at least 8 hours.  Office Visit on 10/09/2021  Component Date Value Ref Range Status   ABO/RH(D) 10/09/2021 A POS   Final   Antibody Screen 10/09/2021 NEG   Final   Sample Expiration 10/09/2021 10/12/2021,2359   Final   Unit Number 10/09/2021 [id number]   Final   Blood Component Type 10/09/2021 RED CELLS,LR   Final   Unit division 10/09/2021 00   Final   Status of Unit 10/09/2021 ISSUED,FINAL   Final   Transfusion Status 10/09/2021 OK TO TRANSFUSE   Final   Crossmatch Result 10/09/2021  Compatible   Final   Unit Number 10/09/2021 [id number]   Final   Blood Component Type 10/09/2021 RED CELLS,LR   Final   Unit division 10/09/2021 00   Final   Status of Unit 10/09/2021 [facility]   Final   Transfusion Status 10/09/2021 OK TO TRANSFUSE   Final   Crossmatch Result 10/09/2021    Final                   Value:Compatible Performed at [facility], [place] [address] [place], [place] [address]    Order Confirmation 10/09/2021    Final                   Value:ORDER PROCESSED BY BLOOD BANK Performed at [facility], [place] [address], [place], [place] [address]    ISSUE DATE / TIME 10/09/2021 [id number]   Final   Blood Product Unit Number 10/09/2021 [id number]   Final   PRODUCT CODE 10/09/2021 [id number]   Final   Unit Type and Rh 10/09/2021 6200   Final   Blood Product Expiration Date 10/09/2021 [id number]   Final   ISSUE DATE / TIME 10/09/2021 [id number]   Final   Blood Product Unit Number 10/09/2021 [id number]   Final   PRODUCT CODE 10/09/2021 [id number]   Final   Unit Type and Rh 10/09/2021 6200   Final   Blood Product Expiration Date 10/09/2021 [id number]   Final  Appointment on 10/09/2021  Component Date Value Ref Range Status   WBC Count 10/09/2021 29.2 (H)  4.0 - 10.5 K/uL Final   RBC 10/09/2021 2.59 (L)  4.22 - 5.81 MIL/uL Final   Hemoglobin 10/09/2021 8.9 (L)  13.0 - 17.0 g/dL Final   HCT 10/09/2021 28.4 (L)  39.0 - 52.0 % Final   MCV 10/09/2021 109.7 (H)  80.0 - 100.0 fL Final   MCH 10/09/2021 34.4 (H)  26.0 - 34.0 pg Final   MCHC 10/09/2021 31.3  30.0 - 36.0 g/dL Final   RDW 10/09/2021 22.2 (H)  11.5 - 15.5 % Final   Platelet Count 10/09/2021 190  150 - 400 K/uL Final   nRBC 10/09/2021 0.4 (H)  0.0 - 0.2 % Final   Neutrophils Relative % 10/09/2021 87  % Final   Neutro Abs 10/09/2021 25.3 (H)  1.7 - 7.7 K/uL Final   Lymphocytes Relative 10/09/2021 3  % Final   Lymphs Abs 10/09/2021 0.9  0.7 - 4.0 K/uL Final    Monocytes Relative 10/09/2021 3  % Final   Monocytes Absolute 10/09/2021 1.0  0.1 - 1.0 K/uL Final   Eosinophils Relative 10/09/2021 0  % Final   Eosinophils Absolute 10/09/2021 0.1  0.0 - 0.5 K/uL Final   Basophils Relative 10/09/2021 1  % Final   Basophils Absolute 10/09/2021 0.4 (H)  0.0 - 0.1 K/uL Final   RBC  Morphology 10/09/2021 MORPHOLOGY UNREMARKABLE   Final   Smear Review 10/09/2021 LARGE PLATELETS PRESENT   Final   Immature Granulocytes 10/09/2021 6  % Final   METAMYELOCYTE SEEN   Abs Immature Granulocytes 10/09/2021 1.64 (H)  0.00 - 0.07 K/uL Final   Hypersegmented Neutrophils 10/09/2021 PRESENT   Final   Performed at [facility] at [facility], [address], [place], [place] [address]   Sodium 10/09/2021 139  135 - 145 mmol/L Final   Potassium 10/09/2021 4.3  3.5 - 5.1 mmol/L Final   Chloride 10/09/2021 105  98 - 111 mmol/L Final   CO2 10/09/2021 24  22 - 32 mmol/L Final   Glucose, Bld 10/09/2021 108 (H)  70 - 99 mg/dL Final   Glucose reference range applies only to samples taken after fasting for at least 8 hours.   BUN 10/09/2021 18  8 - 23 mg/dL Final   Creatinine 10/09/2021 1.29 (H)  0.61 - 1.24 mg/dL Final   Calcium 10/09/2021 9.3  8.9 - 10.3 mg/dL Final   Total Protein 10/09/2021 6.9  6.5 - 8.1 g/dL Final   Albumin 10/09/2021 3.4 (L)  3.5 - 5.0 g/dL Final   AST 10/09/2021 18  15 - 41 U/L Final   ALT 10/09/2021 8  0 - 44 U/L Final   Alkaline Phosphatase 10/09/2021 109  38 - 126 U/L Final   Total Bilirubin 10/09/2021 0.9  0.3 - 1.2 mg/dL Final   GFR, Estimated 10/09/2021 >60  >60 mL/min Final   Comment: (NOTE) Calculated using the CKD-EPI Creatinine Equation (2021)    Anion gap 10/09/2021 10  5 - 15 Final   Performed at [facility] Lab at [facility], [address] [place], [place] [address]   LDH 10/09/2021 386 (H)  98 - 192 U/L Final   Performed at [facility] Lab at [facility], [place], [place], [place] [address]   Smear Review 10/09/2021 SMEAR STAINED AND AVAILABLE FOR REVIEW   Final   Performed at [facility] at [facility], [address], [place], [place] [address]   Ferritin 10/09/2021 43  24 - 336 ng/mL Final   Performed at [facility], [place], [place] [address]   Iron 10/09/2021 23 (L)  45 - 182 ug/dL Final   TIBC 10/09/2021 286  250 - 450 ug/dL Final   Saturation Ratios 10/09/2021 8 (L)  17.9 - 39.5 % Final   UIBC 10/09/2021 263  117 - 376 ug/dL Final   Performed at [facility], [place] [address], [place], [place] [address]   Blood Bank Specimen 10/09/2021 SAMPLE AVAILABLE FOR TESTING   Final   Sample Expiration 10/09/2021    Final                   Value:10/12/2021,2359 Performed at [facility], [place] [address], [place], [place] [address]    Erythropoietin 10/09/2021 33.7 (H)  2.6 - 18.5 mIU/mL Final   Comment: (NOTE) [employer] [place] obtained with different assay methods or kits cannot be used interchangeably. Results cannot be interpreted as absolute evidence of the presence or absence of malignant disease. Performed At: [facility] [place], [place] [id number] Rush Farmer MD QP:[id number]   Admission on 09/27/2021, Discharged on 09/27/2021  Component Date Value Ref Range Status  Sodium 09/27/2021 136  135 - 145 mmol/L Final   Potassium 09/27/2021 4.0  3.5 - 5.1 mmol/L Final   Chloride 09/27/2021 105  98 - 111 mmol/L Final   CO2 09/27/2021 21 (L)  22 - 32 mmol/L Final   Glucose, Bld 09/27/2021 99  70 - 99 mg/dL Final   Glucose reference range applies only to samples taken after fasting for at least 8 hours.   BUN 09/27/2021 19  8 - 23 mg/dL Final   Creatinine, Ser 09/27/2021 1.31 (H)  0.61 - 1.24 mg/dL Final   Calcium 09/27/2021 8.7 (L)  8.9 - 10.3 mg/dL Final   Total  Protein 09/27/2021 7.1  6.5 - 8.1 g/dL Final   Albumin 09/27/2021 3.2 (L)  3.5 - 5.0 g/dL Final   AST 09/27/2021 22  15 - 41 U/L Final   ALT 09/27/2021 12  0 - 44 U/L Final   Alkaline Phosphatase 09/27/2021 101  38 - 126 U/L Final   Total Bilirubin 09/27/2021 1.3 (H)  0.3 - 1.2 mg/dL Final   GFR, Estimated 09/27/2021 60 (L)  >60 mL/min Final   Comment: (NOTE) Calculated using the CKD-EPI Creatinine Equation (2021)    Anion gap 09/27/2021 10  5 - 15 Final   Performed at [facility], [place] [address], [place], [place] [address]   ABO/RH(D) 09/27/2021 A POS   Final   Antibody Screen 09/27/2021 NEG   Final   Sample Expiration 09/27/2021 09/30/2021,2359   Final   Unit Number 09/27/2021 [id number]   Final   Blood Component Type 09/27/2021 RED CELLS,LR   Final   Unit division 09/27/2021 00   Final   Status of Unit 09/27/2021 [facility]   Final   Transfusion Status 09/27/2021 OK TO TRANSFUSE   Final   Crossmatch Result 09/27/2021    Final                   Value:Compatible Performed at [facility], [place] [address], [place], [place] [address]    WBC 09/27/2021 25.9 (H)  4.0 - 10.5 K/uL Final   RBC 09/27/2021 2.34 (L)  4.22 - 5.81 MIL/uL Final   Hemoglobin 09/27/2021 8.1 (L)  13.0 - 17.0 g/dL Final   HCT 09/27/2021 26.3 (L)  39.0 - 52.0 % Final   MCV 09/27/2021 112.4 (H)  80.0 - 100.0 fL Final   MCH 09/27/2021 34.6 (H)  26.0 - 34.0 pg Final   MCHC 09/27/2021 30.8  30.0 - 36.0 g/dL Final   RDW 09/27/2021 23.4 (H)  11.5 - 15.5 % Final   Platelets 09/27/2021 101 (L)  150 - 400 K/uL Final   nRBC 09/27/2021 0.5 (H)  0.0 - 0.2 % Final   Neutrophils Relative % 09/27/2021 87  % Final   Neutro Abs 09/27/2021 22.6 (H)  1.7 - 7.7 K/uL Final   Lymphocytes Relative 09/27/2021 4  % Final   Lymphs Abs 09/27/2021 1.0  0.7 - 4.0 K/uL Final   Monocytes Relative 09/27/2021 4  % Final   Monocytes Absolute 09/27/2021 1.1 (H)  0.1 - 1.0 K/uL Final   Eosinophils  Relative 09/27/2021 0  % Final   Eosinophils Absolute 09/27/2021 0.0  0.0 - 0.5 K/uL Final   Basophils Relative 09/27/2021 2  % Final   Basophils Absolute 09/27/2021 0.4 (H)  0.0 - 0.1 K/uL Final   Immature Granulocytes 09/27/2021 3  % Final   Abs Immature Granulocytes 09/27/2021 0.84 (H)  0.00 - 0.07 K/uL Final  Polychromasia 09/27/2021 PRESENT   Final   Ovalocytes 09/27/2021 PRESENT   Final   Stomatocytes 09/27/2021 PRESENT   Final   Platelet Morphology 09/27/2021 NORMAL   Final   Performed at [facility], [place] [address], [place], [place] [address]   Order Confirmation 09/27/2021    Final                   Value:ORDER PROCESSED BY BLOOD BANK Performed at [facility], [place] [address], [place], [place] [address]    ISSUE DATE / TIME 09/27/2021 [id number]   Final   Blood Product Unit Number 09/27/2021 [id number]   Final   PRODUCT CODE 09/27/2021 [id number]   Final   Unit Type and Rh 09/27/2021 6200   Final   Blood Product Expiration Date 09/27/2021 [id number]   Final     X-Rays:EEG adult  Result Date: 11/12/2021 Lora Havens, MD     11/12/2021  5:07 PM Patient Name: Justin [name] MRN: [id number] Epilepsy Attending: Lora Havens Referring Physician/Provider: Derek Jack, MD Date: 11/12/2021 Duration: 24.57 mins Patient history: 66 year old male with altered mental status.  EEG to evaluate for seizure. Level of alertness: Awake AEDs during EEG study: None Technical aspects: This EEG study was done with scalp electrodes positioned according to the 10-20 International system of electrode placement. Electrical activity was reviewed with band pass filter of 1-'70Hz'$ , sensitivity of 7 uV/mm, display speed of 34m/sec with a '60Hz'$  notched filter applied as appropriate. EEG data were recorded continuously and digitally stored.  Video monitoring was available and reviewed as appropriate. Description: No clear posterior dominant rhythm was  seen. EEG showed continuous generalized 5 to 7 Hz theta slowing. Hyperventilation and photic stimulation were not performed.   ABNORMALITY - Continuous slow, generalized IMPRESSION: This study is suggestive of moderate diffuse encephalopathy, nonspecific etiology. No seizures or epileptiform discharges were seen throughout the recording. PLora Havens  DG CHEST PORT 1 VIEW  Result Date: 11/12/2021 CLINICAL DATA:  Pleural effusion, atrial fibrillation. EXAM: PORTABLE CHEST 1 VIEW COMPARISON:  11/10/2021 FINDINGS: Stable cardiac enlargement and evidence of prior CABG and clipping of the left atrial appendage. Worsening congestive heart failure since the prior study. Probable component of bilateral pleural effusions, left greater than right. No pneumothorax. IMPRESSION: Worsening congestive heart failure. Probable component of bilateral pleural effusions, left greater than right. Electronically Signed   By: GAletta EdouardM.D.   On: 11/12/2021 08:51   CT ABDOMEN PELVIS W CONTRAST  Result Date: 11/11/2021 CLINICAL DATA:  Anemia.  Question retroperitoneal bleed. EXAM: CT ABDOMEN AND PELVIS WITH CONTRAST TECHNIQUE: Multidetector CT imaging of the abdomen and pelvis was performed using the standard protocol following bolus administration of intravenous contrast. RADIATION DOSE REDUCTION: This exam was performed according to the departmental dose-optimization program which includes automated exposure control, adjustment of the mA and/or kV according to patient size and/or use of iterative reconstruction technique. CONTRAST:  1022mOMNIPAQUE IOHEXOL 300 MG/ML  SOLN COMPARISON:  CT of the abdomen and pelvis 10/04/2019 FINDINGS: Lower chest: Large bilateral pleural effusions are present. Partial collapse of the left lower lobe noted. Dependent atelectasis is present on the right. The heart is enlarged. Coronary artery calcifications are present. No significant pericardial effusion is present. Hepatobiliary:  Small amount of fluid is present about the liver. There is some irregularity to the ventral surface of the liver. No discrete lesions are present. Cholesterol gallstones are present within the gallbladder. Layering contrast noted. No  inflammatory changes are present. Pancreas: Unremarkable. No pancreatic ductal dilatation or surrounding inflammatory changes. Spleen: Spleen is enlarged, measuring over 15 cm cephalo caudad dimension. Adrenals/Urinary Tract: Right adrenal adenoma is stable measuring 1.2 cm maximally. Left adrenal gland is normal. Scarring is present both kidneys. Vascular changes are present without definite stone. No renal mass is present. Ureters are within normal limits bilaterally. Diffuse wall thickening is present throughout the urinary bladder. Gas and contrast are present within the bladder. Stomach/Bowel: The stomach and duodenum are within normal limits. Small bowel is unremarkable. Terminal ileum is normal. Ascending transverse colon are within normal limits. Descending and sigmoid colon are normal. Vascular/Lymphatic: Extensive atherosclerotic calcifications are present the aorta branch vessels. No aneurysm is present. No significant adenopathy is present. Reproductive: Prostate is unremarkable. Other: Moderate free fluid is present within the dependent anatomic pelvis. No retroperitoneal hemorrhage is present. Extensive subcutaneous edema is present bilaterally. Musculoskeletal: Multilevel degenerative changes are present lumbar spine. Hemangiomas are present at L2 and L3. Degenerative changes are present at the SI joints. Bilateral hip prostheses noted. IMPRESSION: 1. No retroperitoneal hemorrhage. 2. Large bilateral pleural effusions with partial collapse of the left lower lobe. 3. Moderate free fluid within the dependent anatomic pelvis. 4. Diffuse wall thickening throughout the urinary bladder compatible with cystitis. Correlate with urinalysis. Gas may represent infection or recent  instrumentation. 5. Cholelithiasis without evidence for cholecystitis. 6. Splenomegaly may be related to liver disease. 7. Stable right adrenal adenoma. 8. Extensive subcutaneous edema bilaterally compatible with anasarca. 9.  Aortic Atherosclerosis (ICD10-I70.0). Electronically Signed   By: [place] M.D.   On: 11/11/2021 17:45   CT VENOGRAM HEAD  Result Date: 11/10/2021 CLINICAL DATA:  Dural venous sinus thrombosis suspected MRI brain c/f possible DVST EXAM: CT VENOGRAM HEAD TECHNIQUE: Venographic phase images of the brain were obtained following the administration of intravenous contrast. Multiplanar reformats and maximum intensity projections were generated. RADIATION DOSE REDUCTION: This exam was performed according to the departmental dose-optimization program which includes automated exposure control, adjustment of the mA and/or kV according to patient size and/or use of iterative reconstruction technique. CONTRAST:  64m OMNIPAQUE IOHEXOL 300 MG/ML  SOLN COMPARISON:  MRI head November 08, 2021. FINDINGS: Similar nonopacification of the left sigmoid sinus and visualized left internal jugular vein, suspicious for dural venous sinus thrombosis. The other dural venous sinuses appear patent. Visualized deep cerebral veins appear patent. IMPRESSION: Similar nonopacification of the left sigmoid sinus and visualized left internal jugular vein, suspicious for dural venous sinus thrombosis. Electronically Signed   By: FMargaretha SheffieldM.D.   On: 11/10/2021 10:24   DG Chest 1 View  Result Date: 11/10/2021 CLINICAL DATA:  Acute pulmonary edema. EXAM: CHEST  1 VIEW COMPARISON:  November 08, 2021. FINDINGS: Stable cardiomegaly. Status post coronary bypass graft. Stable bilateral lung opacities are noted consistent with pulmonary edema. Small left pleural effusion is noted. Bony thorax is unremarkable. IMPRESSION: Continued bilateral pulmonary edema.  Small left pleural effusion. Electronically Signed    By: JMarijo ConceptionM.D.   On: 11/10/2021 08:29   ECHOCARDIOGRAM LIMITED  Result Date: 11/09/2021    ECHOCARDIOGRAM LIMITED REPORT   [facility]   [facility]2021 Medical Rec #:  [id number]         Height:       66.0 in Accession #:    [id number]        Weight:       167.0 lb Date of Birth:  May 24, 1955          BSA:          1.852 m Patient Age:    18 years           BP:           119/66 mmHg Patient Gender: M                  HR:           98 bpm. Exam Location:  Inpatient Procedure: Limited Echo, Cardiac Doppler, Color Doppler and Intracardiac            Opacification Agent Indications:    CHF  History:        Patient has prior history of Echocardiogram examinations, most                 recent 11/07/2021.  Sonographer:    Harvie Junior Referring Phys: [id number] [place]  1. Left ventricular ejection fraction, by estimation, is 45 to 50%. The left ventricle has mildly decreased function. The left ventricle demonstrates global hypokinesis.  2. Limited echo evaluate LV function FINDINGS  Left Ventricle: Left ventricular ejection fraction, by estimation, is 45 to 50%. The left ventricle has mildly decreased function. The left ventricle demonstrates global hypokinesis. Aortic Valve: Aortic valve mean gradient measures 8.0 mmHg. Aortic valve peak gradient measures 15.2 mmHg. LEFT VENTRICLE PLAX 2D LVIDd:         4.90 cm LVIDs:         4.20 cm LV PW:         0.90 cm LV IVS:        0.90 cm  LV Volumes (MOD) LV vol d, MOD A2C: 105.0 ml LV vol d, MOD A4C: 150.0 ml LV vol s, MOD A2C: 65.4 ml LV vol s, MOD A4C: 81.8 ml LV SV MOD A2C:     39.6 ml LV SV MOD A4C:     150.0 ml LV SV MOD BP:      53.3 ml AORTIC VALVE AV Vmax:      195.00 cm/s AV Vmean:     127.000 cm/s AV VTI:       0.320 m AV Peak Grad: 15.2 mmHg AV Mean Grad: 8.0 mmHg MITRAL VALVE MV Area (PHT): 4.57 cm MV Decel Time: 166 msec MR Peak grad: 101.8 mmHg MR Vmax:      504.50 cm/s MV E velocity: 163.00 cm/s MV  A velocity: 56.30 cm/s MV E/A ratio:  2.90 Carlyle Dolly MD Electronically signed by Carlyle Dolly MD Signature Date/Time: 11/09/2021/12:05:22 PM    Final    MR BRAIN WO CONTRAST  Result Date: 11/08/2021 CLINICAL DATA:  Neuro deficit.  Acute stroke suspected. EXAM: MRI HEAD WITHOUT CONTRAST TECHNIQUE: Multiplanar, multiecho pulse sequences of the brain and surrounding structures were obtained without intravenous contrast. COMPARISON:  Same-day CT head FINDINGS: Brain: No acute infarction, hydrocephalus, extra-axial collection or mass lesion. Assessment for the presence of intracranial hemorrhage is markedly limited due to the presence of extensive susceptibility artifact on susceptibility weighted imaging, presumably due to patient's recent iron infusion. Vascular: Normal arterial flow voids. Although contrast was not administered, there is a background of vascular signal due to patient's recent iron infusion. There is asymmetrically decreased signal within the left sigmoid sinus (series 6, image 41) with an absent flow void on T2 weighted imaging (series 9, image 7). Skull and upper cervical spine: Normal marrow signal. Sinuses/Orbits: Negative. Other: None. IMPRESSION: 1.  No acute intracranial abnormality. 2. Asymmetrically decreased signal within the left sigmoid sinus with an absent flow void on T2 weighted imaging. This may represent slow flow or occlusion. If there is concern for dural venous sinus thrombosis, further evaluation with a CT venogram is recommended. Electronically Signed   By: Marin Roberts M.D.   On: 11/08/2021 15:37   DG CHEST PORT 1 VIEW  Result Date: 11/08/2021 CLINICAL DATA:  [id number] Acute respiratory failure with hypoxia [facility]) [id number] EXAM: PORTABLE CHEST 1 VIEW COMPARISON:  November 05, 2021 FINDINGS: The cardiomediastinal silhouette is unchanged and enlarged in contour.Status post median sternotomy and CABG. Atherosclerotic calcifications of the aorta. Bilateral pleural  effusions. No pneumothorax. Diffuse interstitial prominence with perihilar vascular fullness and vascular indistinctness, mildly increased. Retrocardiac opacity, likely atelectasis. Visualized abdomen is unremarkable. IMPRESSION: Mildly increased pulmonary edema with small bilateral pleural effusions and favored bibasilar atelectasis. Electronically Signed   By: Valentino Saxon M.D.   On: 11/08/2021 10:44   CT HEAD WO CONTRAST (5MM)  Result Date: 11/08/2021 CLINICAL DATA:  Mental status changes, unknown cause. Atrial fibrillation. Status post hip surgery. EXAM: CT HEAD WITHOUT CONTRAST TECHNIQUE: Contiguous axial images were obtained from the base of the skull through the vertex without intravenous contrast. RADIATION DOSE REDUCTION: This exam was performed according to the departmental dose-optimization program which includes automated exposure control, adjustment of the mA and/or kV according to patient size and/or use of iterative reconstruction technique. COMPARISON:  Head CT dated 10/04/2019. FINDINGS: Brain: There is mild global atrophy, mild atrophic ventriculomegaly and mild small vessel disease of the cerebral white matter, all with a slight progression from 2021. No asymmetry is seen concerning for an acute infarct, hemorrhage or mass. There is no midline shift. Basal cisterns are clear. No old infarct has developed in the interval. Vascular: The carotid siphons are heavily calcified. No hyperdense central vessel is seen. Scattered calcification V4 right vertebral artery. Skull: Negative for fractures or focal lesions. Sinuses/Orbits: Old lens extractions, otherwise unremarkable orbital contents. Visualized sinuses and mastoid air cells are clear. Other: None. IMPRESSION: 1. No acute intracranial CT findings. 2. Mild atrophy and small-vessel disease, but with progression since 2021. 3. Heavy carotid atherosclerosis. Electronically Signed   By: Telford Nab M.D.   On: 11/08/2021 07:42   US  Abdomen Limited RUQ (LIVER/GB)  Result Date: 11/07/2021 CLINICAL DATA:  Cirrhosis EXAM: ULTRASOUND ABDOMEN LIMITED RIGHT UPPER QUADRANT COMPARISON:  CT 10/04/2019 FINDINGS: Gallbladder: Cholelithiasis with multiple tiny stones layering in the gallbladder. Mild pericholecystic edema without wall thickening. Murphy's sign is negative. Appearances are similar to the previous CT scan. Common bile duct: Diameter: 3 mm, normal Liver: Coarsened liver echotexture with nodular contour consistent with history of cirrhosis. No focal lesions identified. Portal vein is patent on color Doppler imaging with normal direction of blood flow towards the liver. Other: Upper abdominal ascites is demonstrated. A small right pleural effusion is demonstrated. IMPRESSION: 1. Cholelithiasis. Mild pericholecystic edema is likely related to liver disease. 2. Nodular contour to the liver consistent with history of cirrhosis. 3. Upper abdominal ascites and small right pleural effusion are present. Electronically Signed   By: Lucienne Capers M.D.   On: 11/07/2021 20:11   ECHOCARDIOGRAM COMPLETE  Result Date: 11/07/2021    ECHOCARDIOGRAM REPORT   Patient Name:   HIGINIO GROW [facility] Date of Exam: 11/07/2021 Medical Rec #:  [id number]          Height:       66.0 in Accession #:  [id number]         Weight:       167.0 lb Date of Birth:  Jul 05, 1955          BSA:          1.852 m Patient Age:    30 years           BP:           107/59 mmHg Patient Gender: M                  HR:           90 bpm. Exam Location:  Inpatient Procedure: 2D Echo, Cardiac Doppler and Color Doppler Indications:    CHF  History:        Patient has prior history of Echocardiogram examinations. CAD,                 Prior CABG; Risk Factors:Hypertension, Diabetes and                 Dyslipidemia.  Sonographer:    Danne Baxter RDCS, FE, PE Referring Phys: [id number] [place]  1. Poor acoustic windows. Diffiuclt to see endocardium Septal hypokinesis Would  recomm limited echo with Definity to confirm/define wall motion and EF.Marland Kitchen Left ventricular ejection fraction, by estimation, is 45 to 50%. The left ventricle has mildly decreased function. The left ventricle demonstrates global hypokinesis. Left ventricular diastolic parameters are indeterminate.  2. Right ventricular systolic function is moderately reduced. The right ventricular size is normal.  3. Left atrial size was severely dilated.  4. Right atrial size was moderately dilated.  5. MR is eccentric, directed more posterior into LA. . Mild to moderate mitral valve regurgitation. Moderate mitral annular calcification.  6. S/P AVR (23 mm MagnaEase tissue valve (procedure date 01/03/18). Peak and mean gradients through the valve are 25 and 12 mm Hg respectively LVOT gradient does not appear accurate to calculated valve area. Compared to echo from 2020, gradients are unchanged. . The aortic valve has been repaired/replaced. Aortic valve regurgitation is not visualized. Procedure Date: 2019.  7. The inferior vena cava is dilated in size with <50% respiratory variability, suggesting right atrial pressure of 15 mmHg. FINDINGS  Left Ventricle: Poor acoustic windows. Diffiuclt to see endocardium Septal hypokinesis Would recomm limited echo with Definity to confirm/define wall motion and EF. Left ventricular ejection fraction, by estimation, is 45 to 50%. The left ventricle has mildly decreased function. The left ventricle demonstrates global hypokinesis. The left ventricular internal cavity size was normal in size. There is no left ventricular hypertrophy. Left ventricular diastolic parameters are indeterminate. Right Ventricle: The right ventricular size is normal. Right vetricular wall thickness was not assessed. Right ventricular systolic function is moderately reduced. Left Atrium: Left atrial size was severely dilated. Right Atrium: Right atrial size was moderately dilated. Pericardium: There is no evidence of  pericardial effusion. Mitral Valve: MR is eccentric, directed more posterior into LA. There is moderate thickening of the mitral valve leaflet(s). There is mild calcification of the mitral valve leaflet(s). Moderate mitral annular calcification. Mild to moderate mitral valve regurgitation. Tricuspid Valve: The tricuspid valve is normal in structure. Tricuspid valve regurgitation is mild. Aortic Valve: S/P AVR (23 mm MagnaEase tissue valve (procedure date 01/03/18). Peak and mean gradients through the valve are 25 and 12 mm Hg respectively LVOT gradient does not appear accurate to calculated valve area. Compared to echo from 2020, gradients are unchanged. The aortic valve has  been repaired/replaced. Aortic valve regurgitation is not visualized. Aortic valve mean gradient measures 12.0 mmHg. Aortic valve peak gradient measures 25.4 mmHg. Aortic valve area, by VTI measures 0.56 cm.  There is a 23 mm [employer] valve present in the aortic position. Pulmonic Valve: The pulmonic valve was not well visualized. Pulmonic valve regurgitation is not visualized. No evidence of pulmonic stenosis. Aorta: The aortic root and ascending aorta are structurally normal, with no evidence of dilitation. Venous: The inferior vena cava is dilated in size with less than 50% respiratory variability, suggesting right atrial pressure of 15 mmHg. IAS/Shunts: No atrial level shunt detected by color flow Doppler.  LEFT VENTRICLE PLAX 2D LVIDd:         5.40 cm LVIDs:         4.60 cm LV PW:         0.90 cm LV IVS:        0.80 cm LVOT diam:     1.70 cm LV SV:         23 LV SV Index:   12 LVOT Area:     2.27 cm  RIGHT VENTRICLE RV S prime:     4.87 cm/s TAPSE (M-mode): 0.7 cm LEFT ATRIUM              Index        RIGHT ATRIUM           Index LA Vol (A2C):   146.0 ml 78.82 ml/m  RA Area:     23.90 cm LA Vol (A4C):   118.0 ml 63.71 ml/m  RA Volume:   78.50 ml  42.38 ml/m LA Biplane Vol: 135.0 ml 72.88 ml/m  AORTIC VALVE AV Area (Vmax):     0.64 cm AV Area (Vmean):   0.63 cm AV Area (VTI):     0.56 cm AV Vmax:           252.00 cm/s AV Vmean:          160.000 cm/s AV VTI:            0.410 m AV Peak Grad:      25.4 mmHg AV Mean Grad:      12.0 mmHg LVOT Vmax:         70.90 cm/s LVOT Vmean:        44.500 cm/s LVOT VTI:          0.101 m LVOT/AV VTI ratio: 0.25  AORTA Ao Root diam: 2.90 cm Ao Asc diam:  3.70 cm TRICUSPID VALVE TR Peak grad:   30.7 mmHg TR Vmax:        277.00 cm/s  SHUNTS Systemic VTI:  0.10 m Systemic Diam: 1.70 cm Dorris Carnes MD Electronically signed by Dorris Carnes MD Signature Date/Time: 11/07/2021/6:41:47 PM    Final    US RENAL  Result Date: 11/06/2021 CLINICAL DATA:  66 year old male with renal failure. EXAM: RENAL / URINARY TRACT ULTRASOUND COMPLETE COMPARISON:  CT Chest, Abdomen, and Pelvis 10/04/2019. FINDINGS: Right Kidney: Renal measurements: 11.2 x 4.5 x 5.1 cm = volume: 135 mL. No hydronephrosis. Normal cortical echogenicity (image 3). Renal vascular calcification suspected on the 2021 exam, but ultrasound appearance now is suspicious for a relatively large 9-10 mm lower pole nephrolith (images 7 and 8 of series 1). No solid right renal mass. Left Kidney: Renal measurements: 11.1 x 5.2 x 4.8 cm = volume: 144 mL. No left hydronephrosis (image 22) and preserved cortical echogenicity. Evidence of left nephrolithiasis, with stones  estimated 6-7 mm. No solid left renal mass. Bladder: Diminutive, with generalized wall thickening (image 39) up to 13 mm. Dependent echogenic foci suspicious for bladder stones (9 -11 mm image 42). The technologist reports a suprapubic catheter, which is partially visible on image 51. Other: None. IMPRESSION: 1. Negative for obstructive uropathy, but evidence of bilateral nephrolithiasis and bladder stones. 2. Maintained renal volume and cortical echogenicity argues against chronic medical renal disease. Electronically Signed   By: Genevie Ann M.D.   On: 11/06/2021 10:52   DG Chest 1 View  Result  Date: 11/05/2021 CLINICAL DATA:  Pulmonary edema EXAM: CHEST  1 VIEW COMPARISON:  Chest x-ray dated August 18, 2021 FINDINGS: Unchanged cardiomegaly. Prior median sternotomy, aortic valve replacement and CABG. Diffuse bilateral interstitial opacities, similar to prior. Trace bilateral pleural effusions. No evidence of pneumothorax. IMPRESSION: Pulmonary edema and trace bilateral pleural effusions. Electronically Signed   By: Yetta Glassman M.D.   On: 11/05/2021 11:25   DG Pelvis Portable  Result Date: 11/04/2021 CLINICAL DATA:  Postop EXAM: PORTABLE PELVIS 1-2 VIEWS COMPARISON:  10/04/2019 FINDINGS: Previous right hip replacement with normal alignment. Interval left hip replacement with intact hardware and normal alignment. Gas within the left hip and thigh soft tissues consistent with recent surgery. Vascular calcifications IMPRESSION: Status post left hip replacement with expected postsurgical change Electronically Signed   By: Donavan Foil M.D.   On: 11/04/2021 15:10   DG HIP UNILAT WITH PELVIS 2-3 VIEWS LEFT  Result Date: 11/04/2021 CLINICAL DATA:  Left hip replacement. EXAM: DG HIP (WITH OR WITHOUT PELVIS) 2-3V LEFT COMPARISON:  None Available. FINDINGS: Three intraoperative fluoroscopic spot images provided. The total fluoroscopic time is 9 seconds and air Karma of 0.6805 mGy. Status post left hip arthroplasty. IMPRESSION: Intraoperative fluoroscopy for left hip arthroplasty. Electronically Signed   By: Anner Crete M.D.   On: 11/04/2021 13:39   DG C-Arm 1-60 Min-No Report  Result Date: 11/04/2021 Fluoroscopy was utilized by the requesting physician.  No radiographic interpretation.   DG C-Arm 1-60 Min-No Report  Result Date: 11/04/2021 Fluoroscopy was utilized by the requesting physician.  No radiographic interpretation.    EKG: Orders placed or performed during the hospital encounter of 11/04/21   EKG 12-Lead   EKG 12-Lead   EKG 12-Lead   EKG 12-Lead     Hospital Course:  Alexiz Sustaita is a 66 y.o. who was admitted to [facility]. They were brought to the operating room on 11/04/2021 and underwent Procedure(s): [place].  Patient tolerated the procedure well and was later transferred to the recovery room and then to the orthopaedic floor for postoperative care. They were given PO and IV analgesics for pain control following their surgery. They were given 24 hours of postoperative antibiotics of  Anti-infectives (From admission, onward)    Start     Dose/Rate Route Frequency Ordered Stop   11/06/21 1400  piperacillin-tazobactam (ZOSYN) IVPB 3.375 g  Status:  Discontinued        3.375 g 12.5 mL/hr over 240 Minutes Intravenous Every 8 hours 11/06/21 0722 11/09/21 0842   11/06/21 0815  piperacillin-tazobactam (ZOSYN) IVPB 3.375 g        3.375 g 100 mL/hr over 30 Minutes Intravenous  Once 11/06/21 0720 11/06/21 1548   11/04/21 1800  ceFAZolin (ANCEF) IVPB 2g/100 mL premix        2 g 200 mL/hr over 30 Minutes Intravenous Every 6 hours 11/04/21 1707 11/05/21 0048   11/04/21  0915  ceFAZolin (ANCEF) IVPB 2g/100 mL premix        2 g 200 mL/hr over 30 Minutes Intravenous On call to O.R. 11/04/21 1281 11/04/21 1239      and started on DVT prophylaxis in the form of  Eliquis .   PT and OT were ordered for total joint protocol. Discharge planning consulted to help with postop disposition and equipment needs.   Patient had a fair night on the evening of surgery. Patient had intra-op blood loss, and anesthesia recommended hospitalist consult for fluid management. Patient received 4 units PRBC between intra-op and 1st day post op. Dr. Marin Olp (heme/onc) followed the patient with [place] as well. Patient was limited with PT on POD #1 due to receiving blood.  On POD #2 patient was somewhat confused, and we discontinued any high risk medications such as opioids and ambien. Hgb was stable, and we restarted Eliquis. Patient ambulated 30 feet  with PT.   POD #3-4, he developed worsening confusion and Neurology was consulted. MRI brain and head CT obtained which showed no acute abnormalities. GI was consulted for concern of metabolic encephalopathy related to possible cirrhosis.   POD #5, patient continued to have confusion. Was able to work with PT some.  POD #6, patient received CTV of the head which confirmed concerns for venous sinus thrombosis. Patient started on IV heparin and transported to ICU for this management. ID consulted due to leukocytosis, with no concerns for infectious etiology.  POD #7-8: Patient had somewhat improved mental status. Neurology performed EEG to evaluate for seizure which was negative. They made recommendations for anticoagulation and signed off.  POD #9: Patient worked with PT and made progress.  POD #10: Patient making significant progress. He was much more alert and oriented. PT recommended CIR. Insurance approval process started.   POD #11-12: Continued working with PT. Transitioned back to Eliquis. Wound consult obtained for left heel pressure sore.   POD #13-14: Continued working wth PT. Dressing removed from left hip.   POD #15: Patient received placement with CIR. Will plan for transition today.     Diet: Regular diet Activity: WBAT Follow-up: in 2 weeks Disposition:  CIR Discharged Condition: good   Discharge Instructions     Type and screen   Complete by: Nov 05, 2021    [place] order/instruction   Complete by: As directed    Transfuse Parameters        Follow-up Information     Paralee Cancel, MD. Schedule an appointment as soon as possible for a visit in 2 week(s).   Specialty: Orthopedic Surgery Contact information: [address] [place] [address] [phone]         [facility] Follow up.   Why: Office will call with appointment time. Contact information: [place] [phone]                Signed: Griffith Citron, PA-C Orthopedic Surgery 11/19/2021, 12:20 PM

## 2021-11-19 NOTE — Progress Notes (Signed)
Justin [name] continues to improve neurologically.  He seems to be pretty much close to his baseline.  He is seen up in a chair.  I think he might be eating a little bit more.  I think he is being worked on with physical therapy and rehab.  I think that we are waiting to see if he can be transferred over to CIR.  I will increase his Hydrea.  His white cell count is going up a little bit more.  His white cell count 32.2.  Hemoglobin 10.2.  Platelet count 185,000.  His BUN is 33 creatinine 1.25.  He has had no cough or shortness of breath.  He has had no bleeding.  There is been no issue with bowels or bladder.  He still has a Foley catheter in.  Hopefully, this can be removed soon.  There has been no fever.  His vital signs show temperature 98.4.  Pulse 86.  Blood pressure 113/66.  His lungs sound clear bilaterally.  He has good air movement bilaterally.  Cardiac exam regular rate and rhythm with occasional extra beat.  He has a normal S1-S2.  Abdomen is soft.  Bowel sounds are good.  He has no fluid wave.  There is no palpable liver or spleen tip.  Extremities shows the surgical site in his left lateral hip to be healing well.  He still has some decreased range of motion of the left leg.  Neurological exam really is nonfocal.  Justin [name] is a very nice 66 year old male.  He has essential thrombocythemia.  He has some neurological issues after surgery.  He had mental status changes.  This seems to have come back to where is close to baseline.  He told me that his wife apparently had an accident yesterday.  She apparently hurt her knee.  Hopefully, he will be able to get into CIR.  That really would be a good idea in a good place for him so he can continue to recover and be come more independent.  Again, we will increase his Hydrea to 500 mg p.o. twice daily.  Hopefully this will get his white cell count down a little bit more.  He continues on anticoagulation for the chronic atrial fibrillation.  I  know he is getting wonderful care from the compassion staff upon [address].  Lattie Haw, MD  Psalms 6:2

## 2021-11-19 NOTE — Progress Notes (Signed)
Physical Therapy Treatment Patient Details Name: Justin [name] MRN: [id number] DOB: 04/28/1955 Today's Date: 11/19/2021   History of Present Illness 66 y.o. male s/p Left THA on 11/04/21.  PMHx: A.Fib on Eliquis, CAD s/p CABG, AS s/p AVR with bioprosthetic, s/p CEA, DM2, HTN, essential thrombocytosis/JAK2+ chronic leukocytosis, course complicated by  dural venous sinus thrombosis post op.    PT Comments    POD x 15 am session. Patient was found in recliner prior to session. Pleasant but Pt c/o of L heel pain, 5 at rest and increased to 7 during ambulation. Denies pain in L hip. Observed edema, warmth, and acute erythema on L heel, alerted nurse. Required min assist with transfers and mod assist +2 during ambulation for recliner follow. Pt c/o of being dizzy and burning pain on L heel during ambulation, returned patient to recliner. Limited session due to pain. He was able to demonstrate safe techniques during transfers without cueing. He needed increased time to process and reply, appeared to have trouble focusing. Pt plans on discharging to acute inpatient rehab when medically stable.  Recommendations for follow up therapy are one component of a multi-disciplinary discharge planning process, led by the attending physician.  Recommendations may be updated based on patient status, additional functional criteria and insurance authorization.  Follow Up Recommendations  Acute inpatient rehab (3hours/day)     Assistance Recommended at Discharge Frequent or constant Supervision/Assistance  Patient can return home with the following Help with stairs or ramp for entrance;A lot of help with walking and/or transfers;A little help with bathing/dressing/bathroom;Assistance with cooking/housework;Assist for transportation   Equipment Recommendations  Other (comment)    Recommendations for Other Services Rehab consult     Precautions / Restrictions       Mobility  Bed Mobility                General bed mobility comments: OOB in recliner    Transfers Overall transfer level: Needs assistance Equipment used: Rolling walker (2 wheels) Transfers: Sit to/from Stand Sit to Stand: Min assist           General transfer comment: patient was able to demonstrate safe transfer techniques.    Ambulation/Gait Ambulation/Gait assistance: Mod assist, +2 safety/equipment Gait Distance (Feet): 15 Feet Assistive device: Rolling walker (2 wheels) Gait Pattern/deviations: Step-to pattern, Decreased stance time - left, Antalgic, Decreased weight shift to left Gait velocity: decreased     General Gait Details: pt was unable to ambulate as far today due to L heel pain.   Stairs             Wheelchair Mobility    Modified Rankin (Stroke Patients Only)       Balance                                            Cognition Arousal/Alertness: Awake/alert Behavior During Therapy: WFL for tasks assessed/performed, Flat affect Overall Cognitive Status: Within Functional Limits for tasks assessed Area of Impairment: Attention, Awareness, Memory                 Orientation Level: Time, Situation Current Attention Level: Sustained Memory: Decreased short-term memory Following Commands: Follows one step commands with increased time   Awareness: Emergent   General Comments: Cognition was delayed in thoughts and responses.        Exercises Total Joint Exercises Ankle Circles/Pumps: AROM, Both,  Supine, 10 reps Short Arc Quad: AROM, Left, 10 reps, Supine Hip ABduction/ADduction: AAROM, Left, Supine, 10 reps Straight Leg Raises: Left, 10 reps, AAROM    General Comments        Pertinent Vitals/Pain Pain Assessment Pain Assessment: 0-10 Pain Score: 7  Pain Location: L heel Pain Descriptors / Indicators: Tightness, Sore Pain Intervention(s): Monitored during session, Premedicated before session, Limited activity within patient's tolerance     Home Living                          Prior Function            PT Goals (current goals can now be found in the care plan section) Acute Rehab PT Goals Patient Stated Goal: to get oob PT Goal Formulation: With patient Time For Goal Achievement: 11/26/21 Potential to Achieve Goals: Fair Progress towards PT goals: Progressing toward goals    Frequency    7X/week      PT Plan Current plan remains appropriate    Co-evaluation     PT goals addressed during session: Mobility/safety with mobility        AM-PAC PT "6 Clicks" Mobility   Outcome Measure  Help needed turning from your back to your side while in a flat bed without using bedrails?: A Lot Help needed moving from lying on your back to sitting on the side of a flat bed without using bedrails?: A Lot Help needed moving to and from a bed to a chair (including a wheelchair)?: A Lot Help needed standing up from a chair using your arms (e.g., wheelchair or bedside chair)?: A Lot Help needed to walk in hospital room?: A Lot Help needed climbing 3-5 steps with a railing? : A Lot 6 Click Score: 12    End of Session Equipment Utilized During Treatment: Gait belt Activity Tolerance: Patient tolerated treatment well Patient left: in chair;with call bell/phone within reach;with chair alarm set Nurse Communication: Mobility status PT Visit Diagnosis: Other abnormalities of gait and mobility (R26.89)     Time: 9528-4132 PT Time Calculation (min) (ACUTE ONLY): 27 min  Charges:  $Gait Training: 8-22 mins $Therapeutic Exercise: 8-22 mins                        Nadene Witherspoon 11/19/2021, 1:21 PM

## 2021-11-19 NOTE — TOC Transition Note (Signed)
Transition of Care [facility]) - CM/SW Discharge Note   Patient Details  Name: Justin [name] MRN: [id number] Date of Birth: Jul 07, 1955  [facility] CM/SW Contact:  Leeroy Cha, RN Phone Number: 11/19/2021, 1:26 PM   Clinical Narrative:    [facility].   Final next level of care: IP Rehab Facility Barriers to Discharge: Barriers Resolved   Patient Goals and CMS Choice Patient states their goals for this hospitalization and ongoing recovery are:: return home      Discharge Placement                       Discharge Plan and Services                DME Arranged: N/A DME Agency: NA                  Social Determinants of Health (SDOH) Interventions     Readmission Risk Interventions   Row Labels 11/06/2021    9:35 AM  Readmission Risk Prevention Plan   Section Header. No data exists in this row.   Transportation Screening   Complete  PCP or Specialist Appt within 3-5 Days   Complete  HRI or [place]   Complete  Social Work Consult for [place] Planning/Counseling   Complete  Palliative Care Screening   Not Applicable  Medication Review [employer])   Complete

## 2021-11-19 NOTE — Progress Notes (Signed)
[facility]                                                                               Justin [name], is a 66 y.o. male, DOB - 03-08-1955, MGQ:[id number] Admit date - 11/04/2021    Outpatient Primary MD for the patient is [name], MD  LOS - 14  days    Brief summary   66 y.o. male with history of essential thrombocythemia, paroxysmal atrial fibrillation on Eliquis, coronary artery disease status post CABG history of functional disability to the left hip secondary to end-stage arthritis, failed outpatient nonsurgical conservative treatment greater than 12 weeks ago was admitted to the orthopedic service for left total hip arthroplasty.    Patient noted to have had significant blood loss in the perioperative period and received 2 units packed transfusions perioperatively on 10/17.  Patient initiated on tranexamic acid by orthopedic surgery, admitted to the hospital with hospitalist group consulted for assistance with suspected pulmonary edema in addition to assistance with management of patient's atrial fibrillation, diabetes, coronary artery disease.    Patient continued to exhibit substantial anemia on 10/18 requiring an additional 2 units of packed red blood cells transfused.  Hospitalization 26/71 was complicated by worsening leukocytosis and the development of encephalopathy.  Initial work-up for encephalopathy was completely negative.  Patient was placed on empiric antibiotic therapy 10/19 which was discontinued on 10/22.  After work-up was found to be negative.  Leukocytosis has progressively worsened throughout the hospitalization and risen to remarkable levels.  Infectious disease consultation on 10/23 was obtained and it was felt the patient was not suffering from an underlying infectious process as the cause.  Due to persisting encephalopathy on 10/23 CT venogram was obtained and found to be highly suggestive of a dural venous sinus thrombosis.  Patient was  initiated on a heparin infusion and transferred to the stepdown unit for close clinical monitoring and serial neurologic checks.   Hospital course also complicated by acute kidney injury of likely multifactorial etiology which progressively improved and resolved  Due to flank bruising on physical exam on 10/24 heparin infusion was temporarily held while CT imaging of the abdomen pelvis was performed.  No retroperitoneal hemorrhage was identified and therefore heparin was resumed.     Assessment & Plan    Assessment and Plan: * Dural venous sinus thrombosis CT venogram 10/23 confirmed the presence of a dural venous sinus thrombosis Atypical presentation as patient's primary symptoms have been encephalopathy and urinary retention, not headache Since initiation of heparin on 10/23, patient's encephalopathy has improved since only, but is a ways off from complete resolution Monitoring patient in the stepdown unit for any evidence of seizure activity or bleeding complications EEG with no seizures, moderate encephalopathy, no focal slowing noted. Patient was seen by neurology who was following who are recommended continue treatment for CVST with heparin with goal between 0.3-0.5 due to coagulopathy, heparin on full dose subcutaneous Lovenox preferred agent for treatment of CVST in the acute phase but after 5 days of treatment with heparin could be transition back to Eliquis 5 mg twice daily.  Per Neurology recommendations.   Clinical improvement. Neurology was following but have  signed off.  Continuing serial neurologic checks Dr. Marin Olp has initiated a hypercoagulable work-up and has additionally initiated hydroxyurea  Due to bump in creatinine patient transitioned to Eliquis which he seems to be tolerating.   Neurology, hematology/oncology following and appreciate input and recommendations.  Acute metabolic encephalopathy Secondary to dural venous sinus thrombosis seen on CTV 10/23 as the  most likely cause of the patient's encephalopathy, in combination with hospital delirium that comes from prolonged hospitalization. Clinical improvement. Ambien, Benadryl and opiates were all discontinued earlier in the hospitalization. B12 and folate levels unremarkable. Ammonia obtained on 10/21 unremarkable despite concerns for possible cirrhosis CT head and MRI brain essentially unremarkable TSH slightly elevated likely secondary to euthyroid sick VBG and ABG reveals no evidence of hypercapnia Improving clinically daily, alert oriented to self place and time. Following commands appropriately.  EEG with no seizures, moderate encephalopathy, no focal slowing. Neurology was following but have signed off.  Low-dose opioid resumed for pain control, monitor mental status closely. Continue supportive care.  AKI (acute kidney injury) ([place]) Renal function improved to baseline a few days ago however creatinine trending back up.. AKI multifactorial Substantial improvement in renal function since placement of a Foley catheter on 10/21 and initiation of Flomax. Patient has a history of BPH with urinary retention in the past per review of old records CT imaging of the abdomen and pelvis revealing circumferential bladder wall thickening concerning for chronic urinary retention That being said, dural venous sinus thrombosis is also been known to cause urinary retention Foley catheter removed however patient failed voiding trial and as such Foley catheter placed back in 11/12/2021.  Urine output of 1300 cc over the past 24 hours, patient was also on IV Lasix which has subsequently been discontinued.. Creatinine was trending back up, patient gently hydrated with IV fluids at 50 cc an hour for 24 hours with creatinine trending back down currently at 1.54.   IV Lasix discontinued.   -Discontinued bethanechol.   Monitor urine output. Minimizing use of nephrotoxic agents Monitoring renal function and  electrolytes with serial chemistry Strict input and output monitoring  Acute on chronic systolic CHF (congestive heart failure) (HCC) Dry mucous membranes and dark urine on exam with concurrent bibasilar rales and diffuse pitting edema consistent with intravascular depletion with anasarca. Elevated on 10/20 at 1749, now down trended to 1137 on 10/23 Patient was aggressively diuresed with intravenous Lasix from 10/20 until morning of 10/23 Patient noted to have significant bilateral effusions with evidence of anasarca on exam and on CT imaging early on during the hospitalization which seems to be improved.  However I feel the majority of this volume overload is now due to third spacing from malnutrition/possible cirrhosis and not acute congestive heart failure  echocardiogram revealing slightly decreased ejection fraction of 45 to 50% revealing some mild systolic dysfunction which was confirmed via focused echo on 10/22. Per Dr Darrick Grinder discussion with the wife on 10/21  patient has been experiencing increasing shortness of breath for at least the past month even prior to his hip surgery. Repeat chest x-ray, still with concerns for pulmonary edema and as such patient placed on Lasix 20 mg IV every 12 hours in addition to IV albumin scheduled every 6 hours x24 hours.   Discontinued IV Lasix on 11/13/2021. Improving clinically, currently on 2 L of nasal cannula O2 with sats of 99%. Monitor urine output. I's and O's.  Elevated INR Profoundly elevated INR greater than 10 confirmed with repeat testing on 10/21 Substantial  improvement in INR with administration of 5 mg of vitamin K on 10/21 and additional '5mg'$  on 10/22.   No clinical evidence of bleeding Etiology unclear but likely multifactorial Clinical picture not consistent with DIC Right upper quadrant ultrasound suggestive of cirrhosis on 10/20 Evidence of significant splenomegaly on CT imaging of the abdomen and pelvis 10/24 Dr. Bryan Lemma  with gastroenterology was consulted who is not entirely convinced the patient has true cirrhosis.  His input is appreciated.   Furthermore, appreciate Dr. Antonieta Pert input on this apparent coagulopathy   Type 2 diabetes mellitus without complication, without long-term current use of insulin (HCC)-resolved as of 11/06/2021 Patient been placed on Accu-Cheks before every meal and nightly with sliding scale insulin Holding home regimen of hypoglycemics Hemoglobin A1C 5.0% Diabetic Diet   BPH with obstruction/lower urinary tract symptoms Per review of the chart patient has experienced urinary retention secondary to BPH in 2021 prompting temporary placement of a Foley catheter Patient's Flomax was held for several days after surgery and was resumed on 10/21 Patient has a history of BPH with urinary retention in the past That being said, dural venous sinus thrombosis is also been known to cause urinary retention Foley catheter removed on 11/11/2021, patient noted to have failed voiding trial and as such Foley catheter placed back in 11/12/2021.  Discontinued bethanechol due to decreased threshold for seizure.  Curb sided urology, Dr.Herrick who recommended voiding trial again tomorrow morning 11/19/2021 and if patient fails voiding trial Foley catheter to be placed back in and patient to be discharged with Foley catheter in place with outpatient urology follow-up. Urology supposedly to arrange outpatient follow-up with patient.  Acute anemia Hemoglobin now stable at 10.2 today. Known history of essential thrombocytopenia on Hydrea No clinical evidence of bleeding Total of 4 units of packed red blood cells transfused during this hospitalization on POD 0 and 1 MCV suggests a macrocytic anemia however vitamin B12 and folate from 08/2021 are unremarkable.  Monitoring hemoglobin and hematocrit with serial CBCs Hematology following.   Chronic atrial fibrillation with RVR (HCC) Improved rate  control Continue home regimen of metoprolol as blood pressure tolerates Monitoring patient on telemetry Was on heparin for dural venous sinus thrombosis, transitioned to Lovenox and subsequently transitioned to Eliquis (11/15/2021).  Leukocytosis Worsening leukocytosis, impressively high during the hospitalization which is trending back down currently at 26.3 from 24.7 from 30.8 from 53.1 from 78.1 (11/14/2021). Cultures negative thus far Infectious disease consultation obtained 10/23 They believed at the time the patient is not suffering from an infectious process and that no further work-up or empiric antibiotics are necessary. Patient received a 3-day course of intravenous Zosyn from 10/19 until 10/21 which was discontinued due to no evidence of infection Peripheral smear on 10/22 unremarkable Patient placed back on hydroxyurea 10/24 by Dr. Marin Olp for leukocytosis which is currently trending back down at 26.3.  Hydroxyurea dose being adjusted. Appreciate hematology/oncology input and recommendations.  S/P total left hip arthroplasty Status post left hip arthroplasty 10/17 with Dr. Alvan Dame Per orthopedic surgery, patient to be weightbearing as tolerated at this point PT, OT evaluations Limited use of opiate-based analgesics due to ongoing delirium Patient noted with some blotchiness/duskiness left upper thigh however noted to have left leg warm and some tenderness to palpation and improving daily. Will defer to primary team, orthopedics,??  CT imaging of left thigh?? Left ankle swelling, tenderness and slight warmth, get  x rays of the the ankle, . Check for gout flare up.  Started him on  keflex for mild cellulitis.    Coronary artery disease involving native heart without angina pectoris Continuing metoprolol, statin therapy Patient is currently chest pain-free   Protein-calorie malnutrition, severe (HCC) Poor oral intake with evidence of fat depletion and weight loss consistent with  severe protein calorie malnutrition Poor nutritional status is contributing to profound anasarca/third spacing Nutrition following, their input is appreciated Nutritional supplements initiated Multivitamin Patient s/p IV albumin every 6 hours x 24 hours.  Mixed diabetic hyperlipidemia associated with type 2 diabetes mellitus ([place]) Continuing home regimen of lipid lowering therapy.   Essential thrombocythemia ([place]) No significant thrombocytosis at this time Dr. Marin Olp with hematology oncology following, his input is appreciated. Patient initiated on hydroxyurea 10/24 and dose being adjusted.  Diuretic-induced hypokalemia Improved but trending back down likely related to diuresis.  Potassium at 4.1   Hypomagnesemia Replaced.  Recheck in am.    Acute urinary retention - Patient with history of BPH, noted to have acute urinary retention early on in the hospitalization Foley catheter placed. -Voiding trial done which patient failed. -Foley catheter placed back in.   -Continue Flomax, discontinued bethanechol. -Curb sided urology, Dr. Louis Meckel who recommended repeat voiding trial in the morning and if patient fails Foley catheter to be placed back in with outpatient follow-up with urology.   Outpatient appointment to be arranged for patient on follow-up with urology.       RN Pressure Injury Documentation: Pressure Injury 11/17/21 Heel Left Unstageable - Full thickness tissue loss in which the base of the injury is covered by slough (yellow, tan, gray, green or brown) and/or eschar (tan, brown or black) in the wound bed. pressure injury (Active)  11/17/21 0612  Location: Heel  Location Orientation: Left  Staging: Unstageable - Full thickness tissue loss in which the base of the injury is covered by slough (yellow, tan, gray, green or brown) and/or eschar (tan, brown or black) in the wound bed.  Wound Description (Comments): pressure injury  Present on Admission: Yes  Dressing  Type Foam - Lift dressing to assess site every shift 11/19/21 0900    Malnutrition Type:  Nutrition Problem: Moderate Malnutrition Etiology: social / environmental circumstances   Malnutrition Characteristics:  Signs/Symptoms: percent weight loss, moderate fat depletion, moderate muscle depletion Percent weight loss: 5 % (in 1 month)   Nutrition Interventions:  Interventions: Ensure Enlive (each supplement provides 350kcal and 20 grams of protein), MVI  Estimated body mass index is 24.23 kg/m as calculated from the following:   Height as of this encounter: '5\' 6"'$  (1.676 m).   Weight as of this encounter: 68.1 kg.  Code Status: DNR DVT Prophylaxis:  Place and maintain sequential compression device Start: 11/16/21 1353 Place TED hose Start: 11/16/21 1353 SCDs Start: 11/04/21 1707 Place TED hose Start: 11/04/21 1707 apixaban (ELIQUIS) tablet 5 mg   Level of Care: Level of care: Telemetry Family Communication: none at bedside.   Disposition Plan:     CIR Today.     Antimicrobials:   Anti-infectives (From admission, onward)    Start     Dose/Rate Route Frequency Ordered Stop   11/19/21 1315  cephALEXin (KEFLEX) capsule 500 mg        500 mg Oral Every 12 hours 11/19/21 1222     11/06/21 1400  piperacillin-tazobactam (ZOSYN) IVPB 3.375 g  Status:  Discontinued        3.375 g 12.5 mL/hr over 240 Minutes Intravenous Every 8 hours 11/06/21 0722 11/09/21 0842   11/06/21 0815  piperacillin-tazobactam (  ZOSYN) IVPB 3.375 g        3.375 g 100 mL/hr over 30 Minutes Intravenous  Once 11/06/21 0720 11/06/21 1548   11/04/21 1800  ceFAZolin (ANCEF) IVPB 2g/100 mL premix        2 g 200 mL/hr over 30 Minutes Intravenous Every 6 hours 11/04/21 1707 11/05/21 0048   11/04/21 0915  ceFAZolin (ANCEF) IVPB 2g/100 mL premix        2 g 200 mL/hr over 30 Minutes Intravenous On call to O.R. 11/04/21 0904 11/04/21 1239        Medications  Scheduled Meds:  apixaban  5 mg Oral BID    cephALEXin  500 mg Oral Q12H   Chlorhexidine Gluconate Cloth  6 each Topical Daily   feeding supplement  237 mL Oral BID BM   hydroxyurea  500 mg Oral q12n4p   loratadine  10 mg Oral Daily   metoprolol tartrate  25 mg Oral BID   multivitamin with minerals  1 tablet Oral Daily   pantoprazole  40 mg Oral BID   polyethylene glycol  17 g Oral Daily   rosuvastatin  20 mg Oral Daily   tamsulosin  0.4 mg Oral Daily   thiamine  100 mg Oral Daily   Continuous Infusions:  sodium chloride Stopped (11/11/21 1130)   methocarbamol (ROBAXIN) IV     PRN Meds:.sodium chloride, acetaminophen, albuterol, bisacodyl, lip balm, liver oil-zinc oxide, menthol-cetylpyridinium **OR** phenol, methocarbamol **OR** methocarbamol (ROBAXIN) IV, metoCLOPramide **OR** metoCLOPramide (REGLAN) injection, nitroGLYCERIN, ondansetron **OR** ondansetron (ZOFRAN) IV, oxyCODONE    Subjective:   Justin [name] was seen and examined today.  Left ankle swelling, tenderness and redness since few days.   Objective:   Vitals:   11/18/21 0910 11/18/21 1149 11/18/21 2044 11/19/21 0459  BP: 112/76 109/66 124/68 113/66  Pulse: 86 78 73 86  Resp:  '16 20 20  '$ Temp:  97.7 F (36.5 C) 97.7 F (36.5 C) 98.4 F (36.9 C)  TempSrc:  Oral Oral Oral  SpO2:  (!) 81% 96% 99%  Weight:      Height:        Intake/Output Summary (Last 24 hours) at 11/19/2021 1343 Last data filed at 11/19/2021 0900 Gross per 24 hour  Intake 340 ml  Output 1000 ml  Net -660 ml   Filed Weights   11/04/21 0941 11/10/21 1537  Weight: 75.8 kg 68.1 kg     Exam General exam: Appears calm and comfortable  Respiratory system: Clear to auscultation. Respiratory effort normal. Cardiovascular system: S1 & S2 heard, RRR. No JVD, No pedal edema. Gastrointestinal system: Abdomen is nondistended, soft and nontender. Normal bowel sounds heard. Central nervous system: Alert and oriented. No focal neurological deficits. Extremities:  left ankle redness with scabs  on the dorsal aspect, with some tenderness and swelling.  Skin: No rashes, lesions or ulcers Psychiatry:  Mood & affect appropriate.     Data Reviewed:  I have personally reviewed following labs and imaging studies   CBC Lab Results  Component Value Date   WBC 32.2 (H) 11/19/2021   RBC 2.91 (L) 11/19/2021   HGB 10.2 (L) 11/19/2021   HCT 32.6 (L) 11/19/2021   MCV 112.0 (H) 11/19/2021   MCH 35.1 (H) 11/19/2021   PLT 185 11/19/2021   MCHC 31.3 11/19/2021   RDW 26.3 (H) 11/19/2021   LYMPHSABS 3.5 11/11/2021   MONOABS 9.6 (H) 11/11/2021   EOSABS 0.0 11/11/2021   BASOSABS 0.0 17/61/6073     Last metabolic panel  Lab Results  Component Value Date   NA 134 (L) 11/19/2021   K 4.4 11/19/2021   CL 103 11/19/2021   CO2 23 11/19/2021   BUN 33 (H) 11/19/2021   CREATININE 1.25 (H) 11/19/2021   GLUCOSE 98 11/19/2021   GFRNONAA >60 11/19/2021   GFRAA >60 10/12/2019   CALCIUM 8.2 (L) 11/19/2021   PHOS 4.1 11/17/2021   PROT 5.2 (L) 11/12/2021   ALBUMIN 3.0 (L) 11/17/2021   LABGLOB 2.8 08/14/2021   AGRATIO 1.3 08/14/2021   BILITOT 2.1 (H) 11/12/2021   ALKPHOS 118 11/12/2021   AST 28 11/12/2021   ALT 11 11/12/2021   ANIONGAP 8 11/19/2021    CBG (last 3)  No results for input(s): "GLUCAP" in the last 72 hours.    Coagulation Profile: No results for input(s): "INR", "PROTIME" in the last 168 hours.   Radiology Studies: DG Ankle Complete Left  Result Date: 11/19/2021 CLINICAL DATA:  Swelling EXAM: LEFT ANKLE COMPLETE - 3+ VIEW COMPARISON:  None Available. FINDINGS: There is ankle soft tissue swelling. There is no evidence of acute fracture. Alignment is normal. There is mild tibiotalar osteoarthritis. Tiny plantar and dorsal calcaneal spurs. Vascular calcifications. IMPRESSION: Mild left ankle soft tissue swelling. No evidence of acute fracture. Mild tibiotalar osteoarthritis. Electronically Signed   By: Maurine Simmering M.D.   On: 11/19/2021 13:14       Hosie Poisson  M.D. [facility] 11/19/2021, 1:43 PM  Available via Epic secure chat 7am-7pm After 7 pm, please refer to night coverage provider listed on amion.

## 2021-11-19 NOTE — Progress Notes (Addendum)
Inpatient Rehabilitation Admission Medication Review by a Pharmacist  A complete drug regimen review was completed for this patient to identify any potential clinically significant medication issues.  High Risk Drug Classes Is patient taking? Indication by Medication  Antipsychotic No   Anticoagulant Yes Apixaban - Afib, sinus thrombosis  Antibiotic Yes Po Cephalexin - UTI, cellulitis  Opioid Yes Oxycodone prn pain  Antiplatelet No   Hypoglycemics/insulin No   Vasoactive Medication Yes Tamsulosin - BPH Metoprolol - BP  Chemotherapy No   Other Yes Rosuvastatin - HLD Pantoprazole - Reflux Loratadine - allergies Hydroxurea - thrombocytemia     Type of Medication Issue Identified Description of Issue Recommendation(s)  Drug Interaction(s) (clinically significant)     Duplicate Therapy     Allergy     No Medication Administration End Date     Incorrect Dose     Additional Drug Therapy Needed     Significant med changes from prior encounter (inform family/care partners about these prior to discharge).    Other       Clinically significant medication issues were identified that warrant physician communication and completion of prescribed/recommended actions by midnight of the next day:  No   Time spent performing this drug regimen review (minutes): 30 minutes   Tad Moore 11/19/2021 3:33 PM

## 2021-11-19 NOTE — Plan of Care (Signed)
  Problem: RH BLADDER ELIMINATION Goal: RH STG MANAGE BLADDER WITH EQUIPMENT WITH ASSISTANCE Description: STG Manage Bladder With Equipment With min Assistance Outcome: Not Progressing; foley cath   Problem: RH SKIN INTEGRITY Goal: RH STG SKIN FREE OF INFECTION/BREAKDOWN Description: Skin will be free of infection/breakdown with min assit Outcome: Not Progressing; noted on skin assessment

## 2021-11-19 NOTE — Progress Notes (Signed)
INPATIENT REHABILITATION ADMISSION NOTE   Arrival method carelink    Mental Orientation:alert   Assessment:done   Skin:done   IV'S:done   Pain:done   Tubes and Drains:none   Safety Measures:done   Vital Signs:done   Height and Weight:done   Rehab Orientation:done   Family:none    Notes:
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
## 2021-11-19 NOTE — Discharge Instructions (Addendum)
Inpatient Rehab Discharge Instructions  [place] Discharge date and time: No discharge date for patient encounter.   Activities/Precautions/ Functional Status: Activity: As tolerated with Prevalon boot left lower extremity Diet: Regular Wound Care: Routine skin checks Functional status:  ___ No restrictions     ___ Walk up steps independently ___ 24/7 supervision/assistance   ___ Walk up steps with assistance ___ Intermittent supervision/assistance  ___ Bathe/dress independently ___ Walk with walker     _x__ Bathe/dress with assistance ___ Walk Independently    ___ Shower independently ___ Walk with assistance    ___ Shower with assistance ___ No alcohol     ___ Return to work/school ________  COMMUNITY REFERRALS UPON DISCHARGE:    Home Health:   PT     OT     ST                   Agency: [facility] Phone: [phone]    Medical Equipment/Items Ordered: Vassie [place] and Transport Chair                                                 Agency/Supplier: [employer] [phone]   Special Instructions: No driving smoking or alcohol  Follow-up urology services Dr. Louis Meckel [phone] for voiding trial.   My questions have been answered and I understand these instructions. I will adhere to these goals and the provided educational materials after my discharge from the hospital.  Patient/Caregiver Signature _______________________________ Date __________  Clinician Signature _______________________________________ Date __________  Please bring this form and your medication list with you to all your follow-up doctor's appointments.

## 2021-11-19 NOTE — Progress Notes (Signed)
[facility] IP rehab admissions - I have received approval for acute inpatient rehab admission for today.  Awaiting MD to assure patient is medically ready for admit today.  Please call me for questions.  [phone]

## 2021-11-19 NOTE — Progress Notes (Signed)
Subjective: 15 Days Post-Op Procedure(s) (LRB): TOTAL HIP ARTHROPLASTY ANTERIOR APPROACH (Left) Patient reports pain as mild.   Patient seen in rounds for Dr. Alvan Dame. Patient is sitting up in a recliner this morning ordering breakfast. He tells me about his wife hyperextending her knee at home, and that he does agree CIR would be helpful in the mean time. No acute events overnight.  We will continue therapy today.   Objective: Vital signs in last 24 hours: Temp:  [97.7 F (36.5 C)-98.4 F (36.9 C)] 98.4 F (36.9 C) (11/01 0459) Pulse Rate:  [73-86] 86 (11/01 0459) Resp:  [16-20] 20 (11/01 0459) BP: (109-124)/(66-76) 113/66 (11/01 0459) SpO2:  [81 %-99 %] 99 % (11/01 0459)  Intake/Output from previous day:  Intake/Output Summary (Last 24 hours) at 11/19/2021 0751 Last data filed at 11/19/2021 0356 Gross per 24 hour  Intake 240 ml  Output 1500 ml  Net -1260 ml     Intake/Output this shift: No intake/output data recorded.  Labs: Recent Labs    11/17/21 0446 11/18/21 0513 11/19/21 0458  HGB 10.4* 10.2* 10.2*   Recent Labs    11/18/21 0513 11/19/21 0458  WBC 26.3* 32.2*  RBC 2.94* 2.91*  HCT 32.1* 32.6*  PLT 197 185   Recent Labs    11/18/21 0513 11/19/21 0458  NA 132* 134*  K 4.1 4.4  CL 101 103  CO2 23 23  BUN 39* 33*  CREATININE 1.54* 1.25*  GLUCOSE 92 98  CALCIUM 7.9* 8.2*   No results for input(s): "LABPT", "INR" in the last 72 hours.  Exam: General - Patient is Alert and Oriented Extremity - Neurologically intact Sensation intact distally Intact pulses distally Dorsiflexion/Plantar flexion intact Dressing - dressing C/D/I Motor Function - intact, moving foot and toes well on exam.   Past Medical History:  Diagnosis Date   Acute meniscal tear of knee LEFT   Anemia    Aortic stenosis 12/01/2017   NONRHEUMATIC, AORTIC VALVE CALCIFICATIONS, MILD TO MODERATE REGURG, MILD TO MODERATE CALCIFIED ANNULUS per ECHO 10/25/17 @ [facility]    Arthritis    Atrial fibrillation ([place]) 11/12/2017   AT O/V WITH PCP   Cancer ([place])    skin right arm   Coronary artery disease    Dyspnea    GERD (gastroesophageal reflux disease)    Headache    hx of migraines   Heart murmur MILD-- ASYMPTOMATIC   History of kidney stones    Hyperlipidemia    Hypertension    Left knee pain    Mixed diabetic hyperlipidemia associated with type 2 diabetes mellitus ([place]) 11/05/2021   PAD (peripheral artery disease) (HCC)    left leg claudication   Sleep apnea     Assessment/Plan: 15 Days Post-Op Procedure(s) (LRB): TOTAL HIP ARTHROPLASTY ANTERIOR APPROACH (Left) Principal Problem:   Dural venous sinus thrombosis Active Problems:   Leukocytosis   Chronic atrial fibrillation with RVR (HCC)   Essential thrombocythemia (HCC)   Coronary artery disease involving native heart without angina pectoris   Acute anemia   AKI (acute kidney injury) ([place])   Acute urinary retention   Mixed hyperlipidemia   S/P total left hip arthroplasty   Acute postoperative anemia due to expected blood loss   Mixed diabetic hyperlipidemia associated with type 2 diabetes mellitus (HCC)   Hypomagnesemia   Acute metabolic encephalopathy   Acute on chronic systolic CHF (congestive heart failure) (HCC)   Protein-calorie malnutrition, severe (HCC)   Elevated INR   BPH with  obstruction/lower urinary tract symptoms   Diuretic-induced hypokalemia   Malnutrition of moderate degree  Estimated body mass index is 24.23 kg/m as calculated from the following:   Height as of this encounter: '5\' 6"'$  (1.676 m).   Weight as of this encounter: 68.1 kg. Advance diet Up with therapy  DVT Prophylaxis - Eliquis Weight bearing as tolerated.  Hgb stable at 10.2 this AM.  Plan for D/C to CIR later this week if approved.   Griffith Citron, PA-C Orthopedic Surgery [phone] 11/19/2021, 7:51 AM

## 2021-11-20 DIAGNOSIS — Z96642 Presence of left artificial hip joint: Secondary | ICD-10-CM | POA: Diagnosis not present

## 2021-11-20 LAB — COMPREHENSIVE METABOLIC PANEL
ALT: 9 U/L (ref 0–44)
AST: 20 U/L (ref 15–41)
Albumin: 2.6 g/dL — ABNORMAL LOW (ref 3.5–5.0)
Alkaline Phosphatase: 112 U/L (ref 38–126)
Anion gap: 8 (ref 5–15)
BUN: 23 mg/dL (ref 8–23)
CO2: 24 mmol/L (ref 22–32)
Calcium: 8.3 mg/dL — ABNORMAL LOW (ref 8.9–10.3)
Chloride: 101 mmol/L (ref 98–111)
Creatinine, Ser: 1 mg/dL (ref 0.61–1.24)
GFR, Estimated: >60 mL/min (ref >60)
Glucose, Bld: 90 mg/dL (ref 70–99)
Potassium: 3.9 mmol/L (ref 3.5–5.1)
Sodium: 133 mmol/L — ABNORMAL LOW (ref 135–145)
Total Bilirubin: 1.7 mg/dL — ABNORMAL HIGH (ref 0.3–1.2)
Total Protein: 6.2 g/dL — ABNORMAL LOW (ref 6.5–8.1)

## 2021-11-20 LAB — CBC WITH DIFFERENTIAL/PLATELET
Abs Immature Granulocytes: 0 10*3/uL (ref 0.00–0.07)
Basophils Absolute: 0 10*3/uL (ref 0.0–0.1)
Basophils Relative: 0 %
Eosinophils Absolute: 0 10*3/uL (ref 0.0–0.5)
Eosinophils Relative: 0 %
HCT: 30.1 % — ABNORMAL LOW (ref 39.0–52.0)
Hemoglobin: 9.6 g/dL — ABNORMAL LOW (ref 13.0–17.0)
Lymphocytes Relative: 3 %
Lymphs Abs: 0.9 10*3/uL (ref 0.7–4.0)
MCH: 35.6 pg — ABNORMAL HIGH (ref 26.0–34.0)
MCHC: 31.9 g/dL (ref 30.0–36.0)
MCV: 111.5 fL — ABNORMAL HIGH (ref 80.0–100.0)
Monocytes Absolute: 0.6 10*3/uL (ref 0.1–1.0)
Monocytes Relative: 2 %
Neutro Abs: 29.6 10*3/uL — ABNORMAL HIGH (ref 1.7–7.7)
Neutrophils Relative %: 95 %
Platelets: 174 10*3/uL (ref 150–400)
RBC: 2.7 MIL/uL — ABNORMAL LOW (ref 4.22–5.81)
RDW: 26.6 % — ABNORMAL HIGH (ref 11.5–15.5)
WBC: 31.2 10*3/uL — ABNORMAL HIGH (ref 4.0–10.5)
nRBC: 0 /100 WBC
nRBC: 0.4 % — ABNORMAL HIGH (ref 0.0–0.2)

## 2021-11-20 MED ORDER — GERHARDT'S BUTT CREAM
TOPICAL_CREAM | Freq: Two times a day (BID) | CUTANEOUS | Status: DC
  Filled 2021-11-20 (×2): qty 1

## 2021-11-20 MED ORDER — CHLORHEXIDINE GLUCONATE CLOTH 2 % EX PADS
6.0000 | MEDICATED_PAD | Freq: Two times a day (BID) | CUTANEOUS | Status: DC
  Administered 2021-11-20 – 2021-11-30 (×20): 6 via TOPICAL

## 2021-11-20 NOTE — Progress Notes (Signed)
PMR Admission Coordinator Pre-Admission Assessment   Patient: Justin [name] is an 66 y.o., male MRN: [id number] DOB: 1955/06/01 Height: _0  (167.6 cm) Weight: 68.1 kg   Insurance Information HMO:  Yes    PRIMARY: BCBS medicare      Policy#: [id number]      Subscriber: Patient     Phone#: [phone]     Fax#: [phone] Pre-Cert#:  Auth #   [id number] with update due on 11/26/21  pproved by Anderson Malta at [phone]      Eff. Date: 01/19/21-still active     Deduct: $0     Out of Pocket Max: $3,[phone] met)      CIR: $335/day co pay for days 1-5      SNF: $0 co pay for days 1-20, $196/day co pay for days 21-60, $0 copay for days 61-100. Max of 100 days/benefit period Outpatient: $10 co pay/visit       Home Health: 100% coverage       DME: 80%     Co-Pay: 20%    The "Data Collection Information Summary" for patients in Inpatient Rehabilitation Facilities with attached "Privacy Act [place] Records" was provided and verbally reviewed with: Patient and Family   Emergency Contact Information Contact Information       Name Relation Home Work [place] [phone]   [phone]         Patient admitting Diagnosis:L THA, Dural venous sinus thormbosis   Current Medical History :  A 66 y.o. male admitted to [facility] for left total hip arthroplasty 10/17 by Dr. Alvan Dame.  Patient noted to have had significant blood loss in the perioperative period and received 2 units packed transfusions perioperatively on 10/17.  Patient initiated on tranexamic acid by orthopedic surgery, admitted to the hospital with hospitalist group consulted for assistance with suspected pulmonary edema in addition to assistance with management of patient's atrial fibrillation, diabetes, coronary artery disease. 10/18 Notified by bedside RN that patients Spo2 was reading 88% on 2L, RN also stated patients pulse/ Heart rate was very variable 25-130s. Patient just finished  1st of 2 PRBC, and has IV iron ordered. Chest x-ray, IV Lasix, and hold second transfusion and IV Ferumoxytol until chest x-ray results come back. Hospitalization 76/28 was complicated by worsening leukocytosis and the development of encephalopathy concerning for an underlying infectious process. Patient was placed on empiric antibiotic therapy. Hospital course also complicated by acute kidney injury. CT head was obtained with no acute findings. Neurology consulted for assistance 10/22 Profoundly elevated INR greater than 10 confirmed with repeat testingSubstantial improvement in INR with administration of 5 mg of vitamin K on 10/21. We will repeat a dose today. No clinical evidence of bleeding. Right upper quadrant ultrasound suggestive of cirrhosis.  Dr. Bryan Lemma with gastroenterology was consulted who is not entirely convinced the patient has true cirrhosis.  Furthermore, appreciate Dr. Antonieta Pert input on this apparent coagulopathy. Going to hold Eliquis for now  which can cause an elevated INR but typically not greater than 2. Echocardiogram revealing slightly decreased ejection fraction of 45 to 50% revealing some mild systolic dysfunction which was confirmed via focused echo on 10/22. Continuing short course of intravenous Lasix twice daily with continued excellent diuretic response. Completed MRI brain yesterday with asymmetrically decreased signal in left sigmoid sinus with absent flow void which may represent slow flow or occlusion.  TTE completed earlier today with interpretation pending. INR 3.3, H/H stable at 9.5/29.8.  Slight improvement in renal  function with creatinine 1.87.  PCT downtrending 10/24 continues to have significantly high leukocytosis. CT venogram 10/23 confirmed the presence of a dural venous sinus thrombosis  Atypical presentation as patient's primary symptoms have been encephalopathy and urinary retention, not headache. Therapies are recommending intensive rehab program.     Patient's medical record from [facility] has been reviewed by the rehabilitation admission coordinator and physician.   Past Medical History      Past Medical History:  Diagnosis Date   Acute meniscal tear of knee LEFT   Anemia     Aortic stenosis 12/01/2017    NONRHEUMATIC, AORTIC VALVE CALCIFICATIONS, MILD TO MODERATE REGURG, MILD TO MODERATE CALCIFIED ANNULUS per ECHO 10/25/17 @ [facility] [address]   Arthritis     Atrial fibrillation ([place]) 11/12/2017    AT O/V WITH PCP   Cancer ([place])      skin right arm   Coronary artery disease     Dyspnea     GERD (gastroesophageal reflux disease)     Headache      hx of migraines   Heart murmur MILD-- ASYMPTOMATIC   History of kidney stones     Hyperlipidemia     Hypertension     Left knee pain     Mixed diabetic hyperlipidemia associated with type 2 diabetes mellitus ([place]) 11/05/2021   PAD (peripheral artery disease) ([facility])      left leg claudication   Sleep apnea        Has the patient had major surgery during 100 days prior to admission? Yes   Family History   family history includes Bladder Cancer in his father. He was adopted.   Current Medications   Current Facility-Administered Medications:    0.9 %  sodium chloride infusion, , Intravenous, PRN, Paralee Cancel, MD, Stopped at 11/11/21 1130   acetaminophen (TYLENOL) tablet 325-650 mg, 325-650 mg, Oral, Q6H PRN, Irving Copas, PA-C, 325 mg at 11/19/21 0934   albuterol (PROVENTIL) (2.5 MG/3ML) 0.083% nebulizer solution 2.5 mg, 2.5 mg, Inhalation, Q4H PRN, Shalhoub, Sherryll Burger, MD   apixaban Arne [place]) tablet 5 mg, 5 mg, Oral, BID, Eugenie Filler, MD, 5 mg at 11/19/21 7169   bisacodyl (DULCOLAX) suppository 10 mg, 10 mg, Rectal, Daily PRN, Irving Copas, PA-C   Chlorhexidine Gluconate Cloth 2 % PADS 6 each, 6 each, Topical, Daily, Shalhoub, Sherryll Burger, MD, 6 each at 11/18/21 1615   feeding supplement (ENSURE ENLIVE / ENSURE PLUS) liquid 237 mL, 237 mL, Oral, BID BM, Paralee Cancel, MD, 237 mL at 11/13/21 1529   hydroxyurea (HYDREA) capsule 500 mg, 500 mg, Oral, q12n4p, Ennever, Rudell Cobb, MD   lip balm (CARMEX) ointment, , Topical, PRN, Paralee Cancel, MD   liver oil-zinc oxide (DESITIN) 40 % ointment, , Topical, PRN, Eugenie Filler, MD   loratadine (CLARITIN) tablet 10 mg, 10 mg, Oral, Daily, Irving Copas, PA-C, 10 mg at 11/19/21 6789   menthol-cetylpyridinium (CEPACOL) lozenge 3 mg, 1 lozenge, Oral, PRN **OR** phenol (CHLORASEPTIC) mouth spray 1 spray, 1 spray, Mouth/Throat, PRN, Irving Copas, PA-C   methocarbamol (ROBAXIN) tablet 500 mg, 500 mg, Oral, Q6H PRN, 500 mg at 11/18/21 0935 **OR** methocarbamol (ROBAXIN) 500 mg in dextrose 5 % 50 mL IVPB, 500 mg, Intravenous, Q6H PRN, Irving Copas, PA-C   metoCLOPramide (REGLAN) tablet 5-10 mg, 5-10 mg, Oral, Q8H PRN **OR** metoCLOPramide (REGLAN) injection 5-10 mg, 5-10 mg, Intravenous, Q8H PRN, Costella Hatcher R, PA-C   metoprolol tartrate (LOPRESSOR) tablet 25  mg, 25 mg, Oral, BID, Eugenie Filler, MD, 25 mg at 11/19/21 0933   multivitamin with minerals tablet 1 tablet, 1 tablet, Oral, Daily, Paralee Cancel, MD, 1 tablet at 11/19/21 0933   nitroGLYCERIN (NITROSTAT) SL tablet 0.4 mg, 0.4 mg, Sublingual, Q5 min PRN, Shalhoub, Sherryll Burger, MD   ondansetron [facility]) tablet 4 mg, 4 mg, Oral, Q6H PRN **OR** ondansetron (ZOFRAN) injection 4 mg, 4 mg, Intravenous, Q6H PRN, Costella Hatcher R, PA-C   oxyCODONE (Oxy IR/ROXICODONE) immediate release tablet 5 mg, 5 mg, Oral, Q4H PRN, Eugenie Filler, MD, 5 mg at 11/19/21 0933   pantoprazole (PROTONIX) EC tablet 40 mg, 40 mg, Oral, BID, Irving Copas, PA-C, 40 mg at 11/19/21 0933   polyethylene glycol (MIRALAX / GLYCOLAX) packet 17 g, 17 g, Oral, Daily, Irving Copas, PA-C, 17 g at 11/17/21 0857   rosuvastatin (CRESTOR) tablet 20 mg, 20 mg, Oral, Daily, Irving Copas, PA-C, 20 mg at 11/19/21 0932   tamsulosin (FLOMAX) capsule 0.4 mg, 0.4 mg, Oral, Daily,  Shalhoub, Sherryll Burger, MD, 0.4 mg at 11/19/21 9892   [DISCONTINUED] thiamine (VITAMIN B1) injection 100 mg, 100 mg, Intravenous, Daily, 100 mg at 11/10/21 1238 **OR** thiamine (VITAMIN B1) tablet 100 mg, 100 mg, Oral, Daily, Paralee Cancel, MD, 100 mg at 11/19/21 1194   Patients Current Diet:  Diet Order                  Diet regular Room service appropriate? Yes; Fluid consistency: Thin  Diet effective now                         Precautions / Restrictions Precautions Precautions: Fall Precaution Comments: monitor vitals Restrictions Weight Bearing Restrictions: No LLE Weight Bearing: Weight bearing as tolerated Other Position/Activity Restrictions: WBAT    Has the patient had 2 or more falls or a fall with injury in the past year? No   Prior Activity Level Community (5-7x/wk): Independent prior to admission   Prior Functional Level Self Care: Did the patient need help bathing, dressing, using the toilet or eating? Independent   Indoor Mobility: Did the patient need assistance with walking from room to room (with or without device)? Independent   Stairs: Did the patient need assistance with internal or external stairs (with or without device)? Independent   Functional Cognition: Did the patient need help planning regular tasks such as shopping or remembering to take medications? Independent   Patient Information Are you of Hispanic, Latino/a,or Spanish origin?: A. No, not of Hispanic, Latino/a, or Spanish origin What is your race?: A. White Do you need or want an interpreter to communicate with a doctor or health care staff?: 0. No   Patient's Response To:  Health Literacy and Transportation Is the patient able to respond to health literacy and transportation needs?: Yes Health Literacy - How often do you need to have someone help you when you read instructions, pamphlets, or other written material from your doctor or pharmacy?: Never In the past 12 months, has lack of  transportation kept you from medical appointments or from getting medications?: No In the past 12 months, has lack of transportation kept you from meetings, work, or from getting things needed for daily living?: No   Home Assistive Devices / [place] Devices/Equipment: Environmental consultant (specify type) Home Equipment: None   Prior Device Use: Indicate devices/aids used by the patient prior to current illness, exacerbation or injury?  none  Current Functional Level Cognition   Arousal/Alertness: Awake/alert Overall Cognitive Status: Within Functional Limits for tasks assessed Current Attention Level: Sustained Orientation Level: Oriented X4 Following Commands: Follows one step commands with increased time General Comments: Cognition was delayed in thoughts and responses. vc's and tactile cues needed for safe hand placement with every transfer. Attention: Sustained Sustained Attention: Impaired Sustained Attention Impairment: Verbal basic, Functional basic Memory: Impaired Memory Impairment: Storage deficit, Decreased recall of new information Awareness: Impaired Awareness Impairment: Intellectual impairment Problem Solving: Impaired Problem Solving Impairment: Verbal basic, Functional basic Behaviors: Perseveration Safety/Judgment: Impaired    Extremity Assessment (includes Sensation/Coordination)   Upper Extremity Assessment: Overall WFL for tasks assessed  Lower Extremity Assessment: Defer to PT evaluation LLE Deficits / Details: anticipated post op hip weakness observed     ADLs   Overall ADL's : Needs assistance/impaired Eating/Feeding: Set up, Sitting Grooming: Wash/dry face, Wash/dry hands, Set up, Sitting Upper Body Bathing: Moderate assistance Upper Body Bathing Details (indicate cue type and reason): for back Lower Body Bathing: Maximal assistance, Sitting/lateral leans Upper Body Dressing : Minimal assistance Upper Body Dressing Details (indicate cue type and  reason): to don second gown like robe Lower Body Dressing: Maximal assistance, Sitting/lateral leans Toilet Transfer: Moderate assistance, +2 for safety/equipment, Stand-pivot, BSC/3in1, Rolling walker (2 wheels) Toilet Transfer Details (indicate cue type and reason): urgency Toileting- Clothing Manipulation and Hygiene: Maximal assistance, +2 for safety/equipment, Sit to/from stand Toileting - Clothing Manipulation Details (indicate cue type and reason): able to maintain standing for extensive rear peri care/cleaning Functional mobility during ADLs: Minimal assistance, +2 for physical assistance, +2 for safety/equipment (EVA walker) General ADL Comments: decreased activity tolerance DOE, decreased balance, decreased cognition     Mobility   Overal bed mobility: Needs Assistance Bed Mobility: Supine to Sit Supine to sit: Mod assist, HOB elevated Sit to supine: Max assist General bed mobility comments: OOB in recliner     Transfers   Overall transfer level: Needs assistance Equipment used: Rolling walker (2 wheels) Transfers: Sit to/from Stand, Bed to chair/wheelchair/BSC Sit to Stand: Min assist Bed to/from chair/wheelchair/BSC transfer type:: Step pivot Step pivot transfers: Mod assist, +2 safety/equipment, +2 physical assistance General transfer comment: vc and tactile cues each time for safe hand placement     Ambulation / Gait / Stairs / Wheelchair Mobility   Ambulation/Gait Ambulation/Gait assistance: Min assist, Mod assist, +2 physical assistance, +2 safety/equipment Gait Distance (Feet): 65 Feet Assistive device: Rolling walker (2 wheels) Gait Pattern/deviations: Step-to pattern, Decreased stance time - left, Antalgic General Gait Details: tolerated using RW for 65 feet +2 for recliner following. Required one seated rest break due to c/o dizziness.  Vitals WNL. Gait velocity: decreased     Posture / Balance Dynamic Sitting Balance Sitting balance - Comments: reports  dizziness Balance Overall balance assessment: Needs assistance Sitting-balance support: Bilateral upper extremity supported, Feet supported Sitting balance-Leahy Scale: Fair Sitting balance - Comments: reports dizziness Standing balance support: Bilateral upper extremity supported, Reliant on assistive device for balance, During functional activity Standing balance-Leahy Scale: Poor Standing balance comment: reliant on RW and therapist     Special needs/care consideration Skin L hip surgical wound, L heel pressure wound    Previous Home Environment (from acute therapy documentation) Living Arrangements: Spouse/significant other  Lives With: Spouse Available Help at Discharge: Family, Available 24 hours/day Type of Home: House Home Layout: One level Home Access: Stairs to enter Entrance Stairs-Rails: None Entrance Stairs-Number of Steps: 1 Bathroom Shower/Tub: Multimedia programmer: Associate Professor  Accessibility: Yes How Accessible: Accessible via walker Home Care Services: No Additional Comments: lived internationally (fluent in [place] - lived in [place], [place], [place]), air flight attendant as an adult   Discharge Living Setting Plans for Discharge Living Setting: Patient's home, Lives with (comment) Type of Home at Discharge: House Discharge Home Layout: One level Discharge Home Access: Stairs to enter Entrance Stairs-Rails: None Entrance Stairs-Number of Steps: 1 Discharge Bathroom Shower/Tub: Walk-in shower Discharge Bathroom Toilet: Standard Discharge Bathroom Accessibility: Yes How Accessible: Accessible via walker Does the patient have any problems obtaining your medications?: No   Social/Family/Support Systems Patient Roles: Spouse Anticipated Caregiver: Ronnie Derby Anticipated Caregiver's Contact Information: [phone] Caregiver Availability: 24/7 Discharge Plan Discussed with Primary Caregiver: Yes Is Caregiver In Agreement with  Plan?: Yes Does Caregiver/Family have Issues with Lodging/Transportation while Pt is in Rehab?: No   Goals Patient/Family Goal for Rehab: Mod I PT, OT Expected length of stay: 14-17 days Pt/Family Agrees to Admission and willing to participate: Yes Program Orientation Provided & Reviewed with Pt/Caregiver Including Roles  & Responsibilities: Yes  Barriers to Discharge: Insurance for SNF coverage   Decrease burden of Care through IP rehab admission: Othern/a   Possible need for SNF placement upon discharge: not aniticipated   Patient Condition: I have reviewed medical records from [facility], spoken with  TOC , and patient and spouse. I discussed via phone for inpatient rehabilitation assessment.  Patient will benefit from ongoing PT and OT, can actively participate in 3 hours of therapy a day 5 days of the week, and can make measurable gains during the admission.  Patient will also benefit from the coordinated team approach during an Inpatient Acute Rehabilitation admission.  The patient will receive intensive therapy as well as Rehabilitation physician, nursing, social worker, and care management interventions.  Due to bladder management, safety, skin/wound care, disease management, medication administration, pain management, and patient education the patient requires 24 hour a day rehabilitation nursing.  The patient is currently min to mod assist with mobility and basic ADLs.  Discharge setting and therapy post discharge at home with home health is anticipated.  Patient has agreed to participate in the Acute Inpatient Rehabilitation Program and will admit today, 11/19/21.   Preadmission Screen Completed By:  Retta Diones, 11/19/2021 11:44 AM ______________________________________________________________________   Discussed status with Dr. Ranell Patrick on 11/19/21 at 0945 and received approval for admission today.   Admission Coordinator:  Retta Diones, RN, time 1130/Date 11/19/21     Assessment/Plan: Diagnosis: Dural venous sinus thrombosis Does the need for close, 24 hr/day Medical supervision in concert with the patient's rehab needs make it unreasonable for this patient to be served in a less intensive setting? Yes Co-Morbidities requiring supervision/potential complications: leukocytosis, chronic atrial fibrillation with RVR, essential thrombocytopenia, CAD, acute anemia Due to bladder management, bowel management, safety, skin/wound care, disease management, medication administration, pain management, and patient education, does the patient require 24 hr/day rehab nursing? Yes Does the patient require coordinated care of a physician, rehab nurse, PT, OT, SLP to address physical and functional deficits in the context of the above medical diagnosis(es)? Yes Addressing deficits in the following areas: balance, endurance, locomotion, strength, transferring, bowel/bladder control, bathing, dressing, feeding, grooming, toileting, cognition, and psychosocial support Can the patient actively participate in an intensive therapy program of at least 3 hrs of therapy 5 days a week? Yes The potential for patient to make measurable gains while on inpatient rehab is excellent Anticipated functional outcomes upon discharge from  inpatient rehab: supervision PT, supervision OT, supervision SLP Estimated rehab length of stay to reach the above functional goals is: 7-9 days Anticipated discharge destination: Home 10. Overall Rehab/Functional Prognosis: excellent     MD Signature: Leeroy Cha, MD         Revision History

## 2021-11-20 NOTE — Plan of Care (Signed)
  Problem: RH BLADDER ELIMINATION Goal: RH STG MANAGE BLADDER WITH EQUIPMENT WITH ASSISTANCE Description: STG Manage Bladder With Equipment With min Assistance Outcome: Not Progressing; foley   Problem: RH SKIN INTEGRITY Goal: RH STG SKIN FREE OF INFECTION/BREAKDOWN Description: Skin will be free of infection/breakdown with min assit Outcome: Not Progressing; see skin documentation
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
## 2021-11-20 NOTE — Plan of Care (Signed)
Wound Care Plan  Wounds present:  Unstageable left heel Pressure injury; stage 2 to coccyx and Pressure injury; stage 2 sacrum x 2 MASD to buttocks  Venous stasis ulcers left leg, left ankle and foot  Braden Score: 14 Sensory: 3 Moisture: 3 Activity: 2 Mobility: 3 Nutrition: 2 Friction: 1  Interventions:  Skin care products/gerhardt's Buttock cream Regular diet Ensure Enlive (doesn't like/refuses) MVI/Vit B12 daily Prevalon boot  Off load heels  Dietician consult requested; alternative to ensure enlive.  Noted patient is mobile and able to off load buttocks; air mattress and pressure redistribution cushion not recommended at this time.  Attendees: Glenis Smoker, RN; Dorien Chihuahua, RN; Meriel Pica, OT; and [name], OTA

## 2021-11-20 NOTE — Progress Notes (Signed)
Initial Nutrition Assessment  DOCUMENTATION CODES:   Non-severe (moderate) malnutrition in context of acute illness/injury  INTERVENTION:  - Discontinue Ensure supplements.   NUTRITION DIAGNOSIS:   Moderate Malnutrition related to acute illness as evidenced by energy intake < 75% for > 7 days, moderate fat depletion, moderate muscle depletion.  GOAL:   Patient will meet greater than or equal to 90% of their needs  MONITOR:   PO intake  REASON FOR ASSESSMENT:   Malnutrition Screening Tool    ASSESSMENT:   66 y.o. male admits to inpatient rehab related to functional deficits secondary to left total hip replacement. PMH includes: anemia, aortic stenosis, arthritis, afib, cancer, CAD, GERD.  Meds include:  MVI, Miralax, Vit B1. Labs reviewed: Na low.   The pt reports that he has been eating better since admission and that his appetite has improved. The pt reports that he was not eating well for 10 days PTA. No significant wt loss per record. Pt currently has Ensure BID ordered. Pt states that he does not like supplements. RD will discontinue Ensure BID for now. Per record, pt ate 45% of his breakfast this am. Will continue to monitor PO intakes.   NUTRITION - FOCUSED PHYSICAL EXAM:  Flowsheet Row Most Recent Value  Orbital Region Moderate depletion  Upper Arm Region Moderate depletion  Thoracic and Lumbar Region Unable to assess  Buccal Region Moderate depletion  Temple Region Severe depletion  Clavicle Bone Region Severe depletion  Clavicle and Acromion Bone Region Severe depletion  Scapular Bone Region Moderate depletion  Dorsal Hand Moderate depletion  Patellar Region Moderate depletion  Anterior Thigh Region Moderate depletion  Posterior Calf Region Moderate depletion  Edema (RD Assessment) Mild  Hair Reviewed  Eyes Reviewed  Mouth Reviewed  Skin Reviewed  Nails Reviewed       Diet Order:   Diet Order             Diet regular Room service appropriate?  Yes; Fluid consistency: Thin  Diet effective now                   EDUCATION NEEDS:   No education needs have been identified at this time  Skin:  Skin Integrity Issues:: Stage II Stage II: Mid coccyx; left sacrum Incisions: left hip; mid sacrum  Last BM:  11/18/21  Height:   Ht Readings from Last 1 Encounters:  11/19/21 '5\' 7"'$  (1.702 m)    Weight:   Wt Readings from Last 1 Encounters:  11/19/21 75.7 kg    Ideal Body Weight:  67.2 kg  BMI:  Body mass index is 26.14 kg/m.  Estimated Nutritional Needs:   Kcal:  1017-5102 kcals  Protein:  95-115 gm  Fluid:  1890-2270 mL  Thalia Bloodgood, RD, LDN, CNSC.

## 2021-11-20 NOTE — Plan of Care (Signed)
  Problem: RH Balance Goal: LTG Patient will maintain dynamic standing balance (PT) Description: LTG:  Patient will maintain dynamic standing balance with assistance during mobility activities (PT) Flowsheets (Taken 11/20/2021 1445) LTG: Pt will maintain dynamic standing balance during mobility activities with:: Independent with assistive device    Problem: RH Balance Goal: LTG Patient will maintain dynamic standing balance (PT) Description: LTG:  Patient will maintain dynamic standing balance with assistance during mobility activities (PT) Flowsheets (Taken 11/20/2021 1445) LTG: Pt will maintain dynamic standing balance during mobility activities with:: Independent with assistive device    Problem: Sit to Stand Goal: LTG:  Patient will perform sit to stand with assistance level (PT) Description: LTG:  Patient will perform sit to stand with assistance level (PT) 11/20/2021 1518 by Lorie Phenix, PT Flowsheets (Taken 11/20/2021 1518) LTG: PT will perform sit to stand in preparation for functional mobility with assistance level: Independent with assistive device 11/20/2021 1445 by Lorie Phenix, PT Flowsheets (Taken 11/20/2021 1327 by Hilary Hertz, Student-PT) LTG: PT will perform sit to stand in preparation for functional mobility with assistance level:  Supervision/Verbal cueing  Independent with assistive device   Problem: RH Bed to Chair Transfers Goal: LTG Patient will perform bed/chair transfers w/assist (PT) Description: LTG: Patient will perform bed to chair transfers with assistance (PT). 11/20/2021 1518 by Lorie Phenix, PT Flowsheets (Taken 11/20/2021 1518) LTG: Pt will perform Bed to Chair Transfers with assistance level: Independent with assistive device  11/20/2021 1445 by Lorie Phenix, PT Flowsheets (Taken 11/20/2021 1327 by Hilary Hertz, Student-PT) LTG: Pt will perform Bed to Chair Transfers with assistance level: Supervision/Verbal cueing
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
## 2021-11-20 NOTE — Plan of Care (Signed)
Problem: RH Balance Goal: LTG Patient will maintain dynamic standing balance (PT) Description: LTG:  Patient will maintain dynamic standing balance with assistance during mobility activities (PT) Flowsheets (Taken 11/20/2021 1445) LTG: Pt will maintain dynamic standing balance during mobility activities with:: Independent with assistive device    Problem: Sit to Stand Goal: LTG:  Patient will perform sit to stand with assistance level (PT) Description: LTG:  Patient will perform sit to stand with assistance level (PT) Flowsheets (Taken 11/20/2021 1327 by Hilary Hertz, Student-PT) LTG: PT will perform sit to stand in preparation for functional mobility with assistance level:  Supervision/Verbal cueing  Independent with assistive device   Problem: RH Bed Mobility Goal: LTG Patient will perform bed mobility with assist (PT) Description: LTG: Patient will perform bed mobility with assistance, with/without cues (PT). Flowsheets (Taken 11/20/2021 1327 by Hilary Hertz, Student-PT) LTG: Pt will perform bed mobility with assistance level of: Supervision/Verbal cueing   Problem: RH Bed to Chair Transfers Goal: LTG Patient will perform bed/chair transfers w/assist (PT) Description: LTG: Patient will perform bed to chair transfers with assistance (PT). Flowsheets (Taken 11/20/2021 1327 by Hilary Hertz, Student-PT) LTG: Pt will perform Bed to Chair Transfers with assistance level: Supervision/Verbal cueing   Problem: RH Car Transfers Goal: LTG Patient will perform car transfers with assist (PT) Description: LTG: Patient will perform car transfers with assistance (PT). Flowsheets (Taken 11/20/2021 1327 by Hilary Hertz, Student-PT) LTG: Pt will perform car transfers with assist:: Minimal Assistance - Patient > 75%   Problem: RH Furniture Transfers Goal: LTG Patient will perform furniture transfers w/assist (OT/PT) Description: LTG: Patient will perform furniture transfers  with assistance  (OT/PT). Flowsheets (Taken 11/20/2021 1327 by Hilary Hertz, Student-PT) LTG: Pt will perform furniture transfers with assist:: Minimal Assistance - Patient > 75%   Problem: RH Ambulation Goal: LTG Patient will ambulate in controlled environment (PT) Description: LTG: Patient will ambulate in a controlled environment, # of feet with assistance (PT). Flowsheets Taken 11/20/2021 1445 by Lorie Phenix, PT LTG: Ambulation distance in controlled environment: 127f with LRAD Taken 11/20/2021 1327 by GHilary Hertz Student-PT LTG: Pt will ambulate in controlled environ  assist needed:: Independent with assistive device Goal: LTG Patient will ambulate in home environment (PT) Description: LTG: Patient will ambulate in home environment, # of feet with assistance (PT). Flowsheets Taken 11/20/2021 1445 by TLorie Phenix PT LTG: Ambulation distance in home environment: 578fwith LRAD Taken 11/20/2021 1327 by GiHilary HertzStudent-PT LTG: Pt will ambulate in home environ  assist needed:: Independent with assistive device Goal: LTG Patient will ambulate in community environment (PT) Description: LTG: Patient will ambulate in community environment, # of feet with assistance (PT). Flowsheets Taken 11/20/2021 1445 by TuLorie PhenixPT LTG: Ambulation distance in community environment: 10050fith LRAD Taken 11/20/2021 1327 by GibHilary Hertztudent-PT LTG: Pt will ambulate in community environ  assist needed:: Supervision/Verbal cueing   Problem: RH Wheelchair Mobility Goal: LTG Patient will propel w/c in controlled environment (PT) Description: LTG: Patient will propel wheelchair in controlled environment, # of feet with assist (PT) Flowsheets Taken 11/20/2021 1445 by TucLorie PhenixT LTG: Propel w/c distance in controlled environment: [id number] 11/20/2021 1327 by GibsHilary Hertzudent-PT LTG: Pt will propel w/c in controlled environ  assist needed:: Independent with assistive  device Goal: LTG Patient will propel w/c in home environment (PT) Description: LTG: Patient will propel wheelchair in home environment, # of feet with assistance (PT). Flowsheets Taken 11/20/2021 1445 by TuckLorie Phenix  LTG: Propel w/c distance in home environment: 69f Taken 11/20/2021 1327 by GHilary Hertz Student-PT LTG: Pt will propel w/c in home environ  assist needed:: Independent with assistive device Goal: LTG Patient will propel w/c in community environment (PT) Description: LTG: Patient will propel wheelchair in community environment, # of feet with assist (PT) Flowsheets Taken 11/20/2021 1445 by TLorie Phenix PT LYOM:AYOKHTw/c distance in community environment: 1049fTaken 11/20/2021 1327 by GiHilary HertzStudent-PT LTG: Pt will propel w/c in community environ  assist needed:: Independent with assistive device

## 2021-11-20 NOTE — Progress Notes (Signed)
Inpatient Rehabilitation  Patient information reviewed and entered into eRehab system by Dmauri Rosenow Johnna Bollier, OTR/L, Rehab Quality Coordinator.   Information including medical coding, functional ability and quality indicators will be reviewed and updated through discharge.   

## 2021-11-20 NOTE — Progress Notes (Signed)
Occupational Therapy Session Note  Patient Details  Name: Justin [name] MRN: [id number] Date of Birth: 06-28-1955  Today's Date: 11/20/2021 OT Individual Time: 0932-3557 OT Individual Time Calculation (min): 72 min    Short Term Goals: Week 1:  OT Short Term Goal 1 (Week 1): STGs = LTGs  Skilled Therapeutic Interventions/Progress Updates:  Pt greeted seated in recliner, pt agreeable to OT intervention. Per orders pt to wear TEDs, retrieved appropriate size teds with pt needing total A to don from recliner. Pt completed stand pivot from recliner>w/c with Rw and CGA. Pt transported to gym with [name] for time mgmt.  Pt completed ~ 10 ft of functional ambulation with Rw and CGA. Pt was noted to have more of a step- to gait pattern, pt reports this is baseline d/t pts herniated discs. Pt completed below seated BUE therex to facilitate improved BUE strength/endurance:  X10 chest presses 4.4 lb ball X10 lateral wood chops with BUEs 4.4 lb weighted ball X0 OH presses with 4.4 weighted ball   Pt completed standing balance task to simulated IADLs with pt instructed to complete unweighted chest presses with no UE support, pt completed task with no LOB for x10 reps however pt reports great fatigue from task as well as increased challenged.    VS assessed from seated:  HR 77  SpO2 91   Pt completed 3 mins of continuous BUE strengthening with pt using 1 lb weighted dowel rod to toss ball back and forth from sitting in chair. Pt completed task with supervision, no increased WOB.  Pt declined walking back to room d/t low energy, pt transported back to room in w/c with total A. Pt completed stand pivot back to EOB with Rw and CGA. Pt completed supine>sit with CGA. Pt left supine in bed with bed alarm activated and all needs within reach.  Therapy Documentation Precautions:  Precautions Precautions: Fall Restrictions Weight Bearing Restrictions: No LUE Weight Bearing: Weight bearing as  tolerated LLE Weight Bearing: Weight bearing as tolerated Other Position/Activity Restrictions: anterior hip precautions  Pain: Unrated pain reported in LLE, rest breaks provided as needed and asked RN for pain meds at end of session.     Therapy/Group: Individual Therapy  [name] 11/20/2021, 3:51 PM

## 2021-11-20 NOTE — Plan of Care (Signed)
  Problem: RH Balance Goal: LTG Patient will maintain dynamic standing with ADLs (OT) Description: LTG:  Patient will maintain dynamic standing balance with assist during activities of daily living (OT)  Flowsheets (Taken 11/20/2021 1239) LTG: Pt will maintain dynamic standing balance during ADLs with: Independent with assistive device   Problem: Sit to Stand Goal: LTG:  Patient will perform sit to stand in prep for activites of daily living with assistance level (OT) Description: LTG:  Patient will perform sit to stand in prep for activites of daily living with assistance level (OT) Flowsheets (Taken 11/20/2021 1239) LTG: PT will perform sit to stand in prep for activites of daily living with assistance level: Independent with assistive device   Problem: RH Bathing Goal: LTG Patient will bathe all body parts with assist levels (OT) Description: LTG: Patient will bathe all body parts with assist levels (OT) Flowsheets (Taken 11/20/2021 1239) LTG: Pt will perform bathing with assistance level/cueing: Independent with assistive device    Problem: RH Dressing Goal: LTG Patient will perform upper body dressing (OT) Description: LTG Patient will perform upper body dressing with assist, with/without cues (OT). Flowsheets (Taken 11/20/2021 1239) LTG: Pt will perform upper body dressing with assistance level of: Independent Goal: LTG Patient will perform lower body dressing w/assist (OT) Description: LTG: Patient will perform lower body dressing with assist, with/without cues in positioning using equipment (OT) Flowsheets (Taken 11/20/2021 1239) LTG: Pt will perform lower body dressing with assistance level of: Independent with assistive device   Problem: RH Toileting Goal: LTG Patient will perform toileting task (3/3 steps) with assistance level (OT) Description: LTG: Patient will perform toileting task (3/3 steps) with assistance level (OT)  Flowsheets (Taken 11/20/2021 1239) LTG: Pt will perform  toileting task (3/3 steps) with assistance level: Independent with assistive device   Problem: RH Toilet Transfers Goal: LTG Patient will perform toilet transfers w/assist (OT) Description: LTG: Patient will perform toilet transfers with assist, with/without cues using equipment (OT) Flowsheets (Taken 11/20/2021 1239) LTG: Pt will perform toilet transfers with assistance level of: Independent with assistive device   Problem: RH Tub/Shower Transfers Goal: LTG Patient will perform tub/shower transfers w/assist (OT) Description: LTG: Patient will perform tub/shower transfers with assist, with/without cues using equipment (OT) Flowsheets (Taken 11/20/2021 1239) LTG: Pt will perform tub/shower stall transfers with assistance level of: Supervision/Verbal cueing LTG: Pt will perform tub/shower transfers from: Walk in shower

## 2021-11-20 NOTE — Progress Notes (Addendum)
Inpatient Rehabilitation Care Coordinator Assessment and Plan Patient Details  Name: Justin [name] MRN: [id number] Date of Birth: Nov 30, 1955  Today's Date: 11/20/2021  Hospital Problems: Principal Problem:   Status post total hip replacement, left Active Problems:   Debility  Past Medical History:  Past Medical History:  Diagnosis Date   Acute meniscal tear of knee LEFT   Anemia    Aortic stenosis 12/01/2017   NONRHEUMATIC, AORTIC VALVE CALCIFICATIONS, MILD TO MODERATE REGURG, MILD TO MODERATE CALCIFIED ANNULUS per ECHO 10/25/17 @ [facility] [address]   Arthritis    Atrial fibrillation ([place]) 11/12/2017   AT O/V WITH PCP   Cancer ([place])    skin right arm   Coronary artery disease    Dyspnea    GERD (gastroesophageal reflux disease)    Headache    hx of migraines   Heart murmur MILD-- ASYMPTOMATIC   History of kidney stones    Hyperlipidemia    Hypertension    Left knee pain    Mixed diabetic hyperlipidemia associated with type 2 diabetes mellitus ([place]) 11/05/2021   PAD (peripheral artery disease) ([facility])    left leg claudication   Sleep apnea    Past Surgical History:  Past Surgical History:  Procedure Laterality Date   AORTIC VALVE REPLACEMENT N/A 01/03/2018   Procedure: AORTIC VALVE REPLACEMENT (AVR) using 85m Magna Ease Bioprosthesis Aortic Valve;  Surgeon: VIvin Poot MD;  [facility]  Service: Open Heart Surgery;  Laterality: N/A;   APPENDECTOMY  1998   BIOPSY  08/11/2018   Procedure: BIOPSY;  Surgeon: NMauri Pole MD;  [facility]  Service: Endoscopy;;   BIOPSY  08/20/2021   Procedure: BIOPSY;  Surgeon: MIrving Copas, MD;  [facility]  Service: Gastroenterology;;   CATARACT EXTRACTION W/ INTRAOCULAR LENS  IMPLANT, BILATERAL  1998/  2000   CERVICAL FUSION  1985   C4 - 5   COLONOSCOPY N/A 08/21/2021   Procedure: COLONOSCOPY;  Surgeon: CDaryel November MD;  [facility]  Service: Gastroenterology;   Laterality: N/A;   CORONARY ARTERY BYPASS GRAFT N/A 01/03/2018   Procedure: CORONARY ARTERY BYPASS GRAFTING (CABG) x 4 (LIMA to LAD, SVG to DIAGONAL, SVG to RAMUS INTERMEDIATE, and SVG to PDA), USING LEFT INFTERNAL MAMMARY ARTERY AND;  Surgeon: VPrescott Gum PCollier Salina MD;  [facility]  Service: Open Heart Surgery;  Laterality: N/A;   ENDARTERECTOMY Right 01/03/2018   Procedure: ENDARTERECTOMY CAROTID;  Surgeon: CMarty Heck MD;  [facility]  Service: Vascular;  Laterality: Right;   ESOPHAGOGASTRODUODENOSCOPY (EGD) WITH PROPOFOL N/A 08/11/2018   Procedure: ESOPHAGOGASTRODUODENOSCOPY (EGD) WITH PROPOFOL;  Surgeon: NMauri Pole MD;  [facility]  Service: Endoscopy;  Laterality: N/A;   ESOPHAGOGASTRODUODENOSCOPY (EGD) WITH PROPOFOL N/A 08/20/2021   Procedure: ESOPHAGOGASTRODUODENOSCOPY (EGD) WITH PROPOFOL;  Surgeon: MRush LandmarkGTelford Nab, MD;  [facility]  Service: Gastroenterology;  Laterality: N/A;   HOT HEMOSTASIS N/A 08/20/2021   Procedure: HOT HEMOSTASIS (ARGON PLASMA COAGULATION/BICAP);  Surgeon: MIrving Copas, MD;  [facility]  Service: Gastroenterology;  Laterality: N/A;   HOT HEMOSTASIS N/A 08/21/2021   Procedure: HOT HEMOSTASIS (ARGON PLASMA COAGULATION/BICAP);  Surgeon: CDaryel November MD;  [facility]  Service: Gastroenterology;  Laterality: N/A;   KNEE ARTHROSCOPY W/ MENISCECTOMY  1991  1987   LEFT KNEE   x 2   NASAL SINUS SURGERY  1982   ORIF ANKLE FRACTURE Right 10/07/2019   Procedure: OPEN REDUCTION INTERNAL FIXATION (ORIF) ANKLE FRACTURE;  Surgeon: Hiram Gash, MD;  Location: [facility];  Service: Orthopedics;  Laterality: Right;  Regional RIGHT  ankle block as well.   RIGHT/LEFT HEART CATH AND CORONARY ANGIOGRAPHY N/A 12/24/2017   Procedure: RIGHT/LEFT HEART CATH AND CORONARY ANGIOGRAPHY;  Surgeon: Jolaine Artist, MD;  Location: [facility];  Service: Cardiovascular;  Laterality: N/A;   ROTATOR  CUFF REPAIR  09-04-2005   LEFT SHOULDER   TEE WITHOUT CARDIOVERSION N/A 12/24/2017   Procedure: TRANSESOPHAGEAL ECHOCARDIOGRAM (TEE);  Surgeon: Jolaine Artist, MD;  Location: [facility];  Service: Cardiovascular;  Laterality: N/A;   TEE WITHOUT CARDIOVERSION N/A 01/03/2018   Procedure: TRANSESOPHAGEAL ECHOCARDIOGRAM (TEE);  Surgeon: Prescott Gum, Collier Salina, MD;  Location: [facility];  Service: Open Heart Surgery;  Laterality: N/A;   TONSILLECTOMY  AGE 23   TOTAL HIP ARTHROPLASTY Right 12/23/2018   Procedure: TOTAL HIP ARTHROPLASTY ANTERIOR APPROACH;  Surgeon: Paralee Cancel, MD;  [facility];  Service: Orthopedics;  Laterality: Right;  70 mins   TOTAL HIP ARTHROPLASTY Left 11/04/2021   Procedure: TOTAL HIP ARTHROPLASTY ANTERIOR APPROACH;  Surgeon: Paralee Cancel, MD;  [facility];  Service: Orthopedics;  Laterality: Left;   Social History:  reports that he quit smoking about 3 years ago. His smoking use included cigars and cigarettes. He has a 5.75 pack-year smoking history. He has never used smokeless tobacco. He reports that he does not currently use alcohol after a past usage of about 2.0 standard drinks of alcohol per week. He reports that he does not use drugs.  Family / Support Systems Marital Status: Married How Long?: 21 years (3rd marriage) Patient Roles: Spouse Spouse/Significant Other: [name] (wife) Children: No Children Other Supports: None reported Anticipated Caregiver: Wife Ability/Limitations of Caregiver: Wife has some physical limitations but unsure if this will be a barrier to assist pt. Caregiver Availability: 24/7 Family Dynamics: Pt lives with his wife and pets (3 cats and a dog).  Social History Preferred language: English Religion: Catholic Cultural Background: Pt worked as a Catering manager for 21 years Education: college [place] - How often do you need to have someone help you when you read instructions, pamphlets, or other written material from  your doctor or pharmacy?: Never Writes: Yes Employment Status: Retired Date Retired/Disabled/Unemployed: 2019 due to health reasons (CABG x4) Legal History/Current Legal Issues: Denies Guardian/Conservator: N/A   Abuse/Neglect Abuse/Neglect Assessment Can Be Completed: Yes Physical Abuse: Denies Verbal Abuse: Denies Sexual Abuse: Denies Exploitation of patient/patient's resources: Denies Self-Neglect: Denies  Patient response to: Social Isolation - How often do you feel lonely or isolated from those around you?: Never  Emotional Status Pt's affect, behavior and adjustment status: Pt in good spirits at time of visit. Recent Psychosocial Issues: Denies Psychiatric History: Denies Substance Abuse History: Pt admits to sometimes a 1-2 beers during the week but indicates happens very rarely.  Patient / Family Perceptions, Expectations & Goals Pt/Family understanding of illness & functional limitations: Pt has a general understnading of his care needs Premorbid pt/family roles/activities: Independent Anticipated changes in roles/activities/participation: Assistance with ADLs/IADLs Pt/family expectations/goals: Pt goal is to "recover so I can get home and I can get on my motorcycle."  [employer]: None Premorbid Home Care/DME Agencies: None Transportation available at discharge: Wife Is the patient able to respond to transportation needs?: Yes In the past 12 months, has lack of transportation kept you from medical appointments or from getting medications?: No In the past 12 months, has lack of transportation kept  you from meetings, work, or from getting things needed for daily living?: No Resource referrals recommended: Neuropsychology  Discharge Planning Living Arrangements: Spouse/significant other Support Systems: Spouse/significant other Type of Residence: Private residence Insurance Resources: Multimedia programmer (specify) ([place]) Financial  Resources: Other (Comment), SSD (pension/savings) Financial Screen Referred: No Living Expenses: Own Money Management: Spouse Does the patient have any problems obtaining your medications?: No Home Management: Pt reports he and  his wife both manage home care needs. Patient/Family Preliminary Plans: TBD Care Coordinator Barriers to Discharge: Decreased caregiver support, Lack of/limited family support, Insurance for SNF coverage Care Coordinator Anticipated Follow Up Needs: HH/OP  Clinical Impression This SW covering for primary SW, Erlene Quan.   SW met with pt in room to introduce self, explain role, and discuss discharge process. Pt is not a English as a second language teacher. No HCPOA. DME: 3in1n BSC (does not use), RW, and shower chair. SW shared will follow-up with his wife. He reported his wife was not feeling well due to an injury. SW shared will call his wife [name] [phone]) tomorrow. Pt is aware primary SW, Margreta Journey will follow-up with him with updates.   Rana Snare 11/20/2021, 3:34 PM

## 2021-11-21 DIAGNOSIS — Z96642 Presence of left artificial hip joint: Secondary | ICD-10-CM | POA: Diagnosis not present

## 2021-11-21 DIAGNOSIS — L8990 Pressure ulcer of unspecified site, unspecified stage: Secondary | ICD-10-CM | POA: Insufficient documentation

## 2021-11-21 MED ORDER — FUROSEMIDE 40 MG PO TABS
40.0000 mg | ORAL_TABLET | Freq: Once | ORAL | Status: AC
  Administered 2021-11-21: 40 mg via ORAL
  Filled 2021-11-21: qty 1

## 2021-11-21 MED ORDER — ZOLPIDEM TARTRATE 5 MG PO TABS
5.0000 mg | ORAL_TABLET | Freq: Every evening | ORAL | Status: DC | PRN
  Administered 2021-11-21 – 2021-11-29 (×8): 5 mg via ORAL
  Filled 2021-11-21 (×8): qty 1

## 2021-11-21 NOTE — Progress Notes (Signed)
PROGRESS NOTE   Subjective/Complaints:  No issues overnite except chronic insomnia, uses ambien at home  Reviewed labs  ROS - neg CP, SOB, neg hip pain with sitting  Objective:   DG Ankle Complete Left  Result Date: 11/19/2021 CLINICAL DATA:  Swelling EXAM: LEFT ANKLE COMPLETE - 3+ VIEW COMPARISON:  None Available. FINDINGS: There is ankle soft tissue swelling. There is no evidence of acute fracture. Alignment is normal. There is mild tibiotalar osteoarthritis. Tiny plantar and dorsal calcaneal spurs. Vascular calcifications. IMPRESSION: Mild left ankle soft tissue swelling. No evidence of acute fracture. Mild tibiotalar osteoarthritis. Electronically Signed   By: Maurine Simmering M.D.   On: 11/19/2021 13:14   Recent Labs    11/19/21 0458 11/20/21 0608  WBC 32.2* 31.2*  HGB 10.2* 9.6*  HCT 32.6* 30.1*  PLT 185 174   Recent Labs    11/19/21 0458 11/20/21 0608  NA 134* 133*  K 4.4 3.9  CL 103 101  CO2 23 24  GLUCOSE 98 90  BUN 33* 23  CREATININE 1.25* 1.00  CALCIUM 8.2* 8.3*    Intake/Output Summary (Last 24 hours) at 11/21/2021 0717 Last data filed at 11/21/2021 0357 Gross per 24 hour  Intake 589 ml  Output 1850 ml  Net -1261 ml     Pressure Injury 11/17/21 Heel Left Unstageable - Full thickness tissue loss in which the base of the injury is covered by slough (yellow, tan, gray, green or brown) and/or eschar (tan, brown or black) in the wound bed. pressure injury (Active)  11/17/21 0612  Location: Heel  Location Orientation: Left  Staging: Unstageable - Full thickness tissue loss in which the base of the injury is covered by slough (yellow, tan, gray, green or brown) and/or eschar (tan, brown or black) in the wound bed.  Wound Description (Comments): pressure injury  Present on Admission: Yes (present on transfer to 4E/Urology)     Pressure Injury 11/19/21 Coccyx Mid;Lower Stage 2 -  Partial thickness loss of dermis  presenting as a shallow open injury with a red, pink wound bed without slough. (Active)  11/19/21 1625  Location: Coccyx  Location Orientation: Mid;Lower  Staging: Stage 2 -  Partial thickness loss of dermis presenting as a shallow open injury with a red, pink wound bed without slough.  Wound Description (Comments):   Present on Admission: Yes     Pressure Injury 11/19/21 Sacrum Left Stage 2 -  Partial thickness loss of dermis presenting as a shallow open injury with a red, pink wound bed without slough. (Active)  11/19/21 1625  Location: Sacrum  Location Orientation: Left  Staging: Stage 2 -  Partial thickness loss of dermis presenting as a shallow open injury with a red, pink wound bed without slough.  Wound Description (Comments):   Present on Admission: Yes     Pressure Injury 11/19/21 Sacrum Right Stage 2 -  Partial thickness loss of dermis presenting as a shallow open injury with a red, pink wound bed without slough. (Active)  11/19/21 1625  Location: Sacrum  Location Orientation: Right  Staging: Stage 2 -  Partial thickness loss of dermis presenting as a shallow open injury with a red,  pink wound bed without slough.  Wound Description (Comments):   Present on Admission: Yes    Physical Exam: Vital Signs Blood pressure 113/67, pulse 74, temperature 97.9 F (36.6 C), temperature source Oral, resp. rate 18, height '5\' 7"'$  (1.702 m), weight 75.7 kg, SpO2 96 %.   General: No acute distress Mood and affect are appropriate Heart: Regular rate and rhythm no rubs murmurs or extra sounds Lungs: Clear to auscultation, breathing unlabored, no rales or wheezes Abdomen: Positive bowel sounds, soft nontender to palpation, nondistended Extremities: No clubbing, cyanosis, 2+ edema bilateral pretib more prominenet on left  Skin: No evidence of breakdown, no evidence of rash Neurologic: Cranial nerves II through XII intact, motor strength is 5/5 in bilateral deltoid, bicep, tricep, grip, hip  flexor, knee extensors, ankle dorsiflexor and plantar flexor Sensory exam normal sensation to light touch and proprioception in bilateral upper and lower extremities Cerebellar exam normal finger to nose to finger as well as heel to shin in bilateral upper and lower extremities Musculoskeletal: Full range of motion in all 4 extremities. No joint swelling   Assessment/Plan: 1. Functional deficits which require 3+ hours per day of interdisciplinary therapy in a comprehensive inpatient rehab setting. Physiatrist is providing close team supervision and 24 hour management of active medical problems listed below. Physiatrist and rehab team continue to assess barriers to discharge/monitor patient progress toward functional and medical goals  Care Tool:  Bathing    Body parts bathed by patient: Right arm, Left arm, Chest, Abdomen, Front perineal area, Buttocks, Right upper leg, Left upper leg, Face   Body parts bathed by helper: Right lower leg, Left lower leg     Bathing assist Assist Level: Minimal Assistance - Patient > 75%     Upper Body Dressing/Undressing Upper body dressing   What is the patient wearing?: Pull over shirt    Upper body assist Assist Level: Set up assist    Lower Body Dressing/Undressing Lower body dressing      What is the patient wearing?: Pants     Lower body assist Assist for lower body dressing: Minimal Assistance - Patient > 75%     Toileting Toileting    Toileting assist Assist for toileting: Contact Guard/Touching assist     Transfers Chair/bed transfer  Transfers assist     Chair/bed transfer assist level: Contact Guard/Touching assist     Locomotion Ambulation   Ambulation assist      Assist level: Minimal Assistance - Patient > 75% Assistive device: Walker-rolling Max distance: 55   Walk 10 feet activity   Assist     Assist level: Minimal Assistance - Patient > 75% Assistive device: Walker-rolling   Walk 50 feet  activity   Assist    Assist level: Minimal Assistance - Patient > 75% Assistive device: Walker-rolling    Walk 150 feet activity   Assist Walk 150 feet activity did not occur: Safety/medical concerns         Walk 10 feet on uneven surface  activity   Assist Walk 10 feet on uneven surfaces activity did not occur: Safety/medical concerns         Wheelchair     Assist Is the patient using a wheelchair?: Yes Type of Wheelchair: Manual    Wheelchair assist level: Supervision/Verbal cueing Max wheelchair distance: 100    Wheelchair 50 feet with 2 turns activity    Assist        Assist Level: Supervision/Verbal cueing   Wheelchair 150 feet activity  Assist      Assist Level: Moderate Assistance - Patient 50 - 74%   Blood pressure 113/67, pulse 74, temperature 97.9 F (36.6 C), temperature source Oral, resp. rate 18, height '5\' 7"'$  (1.702 m), weight 75.7 kg, SpO2 96 %.  Medical Problem List and Plan: 1. Functional deficits secondary to Left total hip replacement anterior approach 68/11/5724 complicated by acute metabolic encephalopathy.  Weightbearing as tolerated..             -patient may shower             -ELOS/Goals: 7-9 days S CIR 2.  Antithrombotics: -DVT/anticoagulation:  Pharmaceutical: Eliquis             -antiplatelet therapy: N/A 3. Postoperative pain: continue Robaxin and oxycodone as needed 4. Mood/Behavior/Sleep: Provide emotional support             -antipsychotic agents: N/A 5. Neuropsych/cognition: This patient is capable of making decisions on his own behalf. 6.  Left heel pressure ulcer.  Continue Prevalon boot.  Paint left heel wound with Betadine daily, allowed to air dry.  Routine skin checks 7. Fluids/Electrolytes/Nutrition: Routine in and outs with follow-up chemistries 8.  Acute blood loss anemia with history of essential thrombocytopenia/coagulopathy/leukocytosis.  Maintained on Hydrea. follow-up hematology  services.    Latest Ref Rng & Units 11/20/2021    6:08 AM 11/19/2021    4:58 AM 11/18/2021    5:13 AM  CBC  WBC 4.0 - 10.5 K/uL 31.2  32.2  26.3   Hemoglobin 13.0 - 17.0 g/dL 9.6  10.2  10.2   Hematocrit 39.0 - 52.0 % 30.1  32.6  32.1   Platelets 150 - 400 K/uL 174  185  197     9.  Aortic stenosis with aortic valve replacement/CABG 01/03/2018.  Continue home Eliquis 10.  Atrial fibrillation.  Cardiac rate controlled.  Continue Lopressor 25 mg twice daily.  Continue Eliquis Vitals:   11/20/21 2031 11/21/21 0357  BP: 107/71 113/67  Pulse: 82 74  Resp: 18 18  Temp: 97.9 F (36.6 C) 97.9 F (36.6 C)  SpO2: 98% 96%    11.  Dural venous sinus thrombosis.  Confirmed by CT venogram 10/23.  Patient has been transitioned back to Eliquis 12.  Acute metabolic encephalopathy.  EEG negative.  Ammonia levels within normal limits.  CT MRI unremarkable. 13.  Acute on chronic systolic congestive heart failure.  Monitor for any signs of fluid overload Filed Weights   11/19/21 1500  Weight: 75.7 kg   Pretib edema , check daily weights, will give lasix '40mg'$  po x 1  14.  BPH/urinary retention.  Flomax has been resumed 0.4 mg daily.  Foley catheter tube removed 11/11/2021 patient noted to have failed voiding trial Foley tube reinserted 11/12/2021 and would remain in place.DO NOT REMOVE FOLEY.  Plan follow-up outpatient with Dr. Louis Meckel urology services 15.  Hyperlipidemia.  Crestor 16.  GERD.  Protonix  17.  Mild cellulitis L heel- Keflex x 7d  LOS: 2 days A FACE TO FACE EVALUATION WAS PERFORMED  Charlett Blake 11/21/2021, 7:17 AM

## 2021-11-21 NOTE — IPOC Note (Signed)
Overall Plan of Care [facility]) Patient Details Name: Justin [name] MRN: [id number] DOB: [birth year]  Admitting Diagnosis: Status post total hip replacement, left  Hospital Problems: Principal Problem:   Status post total hip replacement, left Active Problems:   Debility   Pressure injury of skin     Functional Problem List: Nursing Bladder, Bowel, Edema, Endurance, Medication Management, Nutrition, Safety, Skin Integrity  PT Balance, Edema, Endurance, Skin Integrity, Pain, Safety  OT Balance, Endurance, Pain  SLP    TR         Basic ADL's: OT Bathing, Dressing, Toileting     Advanced  ADL's: OT       Transfers: PT Bed Mobility, Bed to Chair, Car, Manufacturing systems engineer, Metallurgist: PT Ambulation, Emergency planning/management officer, Stairs     Additional Impairments: OT None  SLP        TR      Anticipated Outcomes Item Anticipated Outcome  Self Feeding no goal, pt is independent  Swallowing      Basic self-care  mod I  Toileting  mod I   Bathroom Transfers mod I to toilet, supervision to shower stall  Bowel/Bladder  continent x 2 foley to remain in place  Transfers  modified indep with LRAD  Locomotion  Ambulatory with LRAD at Mod I level  Communication     Cognition     Pain  less than 3  Safety/Judgment  remain fall free while in rehab   Therapy Plan: PT Intensity: Minimum of 1-2 x/day ,45 to 90 minutes PT Frequency: 5 out of 7 days PT Duration Estimated Length of Stay: 7-10 days (7-10 days) OT Intensity: Minimum of 1-2 x/day, 45 to 90 minutes OT Frequency: 5 out of 7 days OT Duration/Estimated Length of Stay: 7 days     Team Interventions: Nursing Interventions Patient/Family Education, Disease Management/Prevention, Skin Care/Wound Management, Discharge Planning, Bladder Management, Pain Management, Bowel Management, Medication Management  PT interventions Ambulation/gait training, Community reintegration, DME/adaptive equipment  instruction, Neuromuscular re-education, Stair training, UE/LE Strength taining/ROM, Wheelchair propulsion/positioning, Training and development officer, Discharge planning, Pain management, Skin care/wound management, Therapeutic Activities, Functional mobility training, Patient/family education, Therapeutic Exercise, Disease management/prevention, UE/LE Coordination activities, Psychosocial support  OT Interventions Balance/vestibular training, Discharge planning, DME/adaptive equipment instruction, Pain management, Self Care/advanced ADL retraining, Therapeutic Activities, Therapeutic Exercise, UE/LE Strength taining/ROM, Psychosocial support, Patient/family education, Functional mobility training  SLP Interventions    TR Interventions    SW/CM Interventions Discharge Planning, Psychosocial Support, Patient/Family Education   Barriers to Discharge MD  Medical stability  Nursing Decreased caregiver support, Incontinence, Wound Care, Weight bearing restrictions, Medication compliance, Pending chemo/radiation 1 level house with 1 ste entry  PT Wound Care, Inaccessible home environment, Home environment access/layout, Neurogenic Bowel & Bladder, Insurance for SNF coverage    OT      SLP      SW Decreased caregiver support, Lack of/limited family support, Insurance underwriter for SNF coverage     Team Discharge Planning: Destination: PT-Home ,OT- Home , SLP-  Projected Follow-up: PT-Outpatient PT, OT-  None, SLP-  Projected Equipment Needs: PT-Rolling walker with 5" wheels, Cane, Tub/shower bench, To be determined, OT- None recommended by OT, SLP-  Equipment Details: PT- , OT-  Patient/family involved in discharge planning: PT- Patient,  OT-Patient, SLP-   MD ELOS: 7-9d Medical Rehab Prognosis:  Good Assessment: The patient has been admitted for CIR therapies with the diagnosis of Left hip OA with Left ant THA. The team will be addressing  functional mobility, strength, stamina, balance, safety, adaptive  techniques and equipment, self-care, bowel and bladder mgt, patient and caregiver education, coagulopathy . Goals have been set at ModI/S. Anticipated discharge destination is Home with wife.        See Team Conference Notes for weekly updates to the plan of care

## 2021-11-21 NOTE — Progress Notes (Signed)
Occupational Therapy Session Note  Patient Details  Name: Justin [name] MRN: [id number] Date of Birth: 02/01/1955  Today's Date: 11/21/2021 OT Individual Time: 1100-1200 OT Individual Time Calculation (min): 60 min    Short Term Goals: Week 1:  OT Short Term Goal 1 (Week 1): STGs = LTGs  Skilled Therapeutic Interventions/Progress Updates:    Pt received in recliner ready for therapy.  Today's session focused on UE strength and general mobility. Pt used RW to ambulate from recliner to wc with close S to light CGA with standing from low recliner.  Pt taken to gym - used 2lb dowel bar for a variety of shoulder, elbow and core conditioning exercises.  Pt did need short interval rest breaks.  He self propelled wc back to room and transferred back to recliner to rest. Place a roho cushion in recliner for skin protection of his bottom.  Pt in room with all needs met.   Therapy Documentation Precautions:  Precautions Precautions: Fall Restrictions Weight Bearing Restrictions: No LUE Weight Bearing: Weight bearing as tolerated LLE Weight Bearing: Weight bearing as tolerated Other Position/Activity Restrictions: anterior hip precautions   Pain: Pain Assessment Pain Scale: 0-10 Pain Score: 3  Pain Type: Acute pain Pain Location: Hip Pain Orientation: Anterior Pain Descriptors / Indicators: Aching Pain Frequency: Intermittent Pain Onset: On-going Patients Stated Pain Goal: 2 Pain Intervention(s): Medication (See eMAR) Multiple Pain Sites: No ADL: ADL Eating: Independent Grooming: Setup Upper Body Bathing: Setup Where Assessed-Upper Body Bathing: Shower Lower Body Bathing: Minimal assistance Where Assessed-Lower Body Bathing: Shower Upper Body Dressing: Setup Lower Body Dressing: Minimal assistance Toileting: Contact guard Where Assessed-Toileting: Glass blower/designer: Lawyer: Raised toilet seat Social research officer, government: Agricultural engineer Method: Heritage manager: Grab bars, Shower seat with back   Therapy/Group: Individual Therapy  Marlene Pfluger 11/21/2021, 12:40 PM
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
## 2021-11-21 NOTE — Care Management (Signed)
[facility] Individual Statement of Services  Patient Name:  Justin [name]  Date:  11/21/2021  Welcome to the [facility].  Our goal is to provide you with an individualized program based on your diagnosis and situation, designed to meet your specific needs.  With this comprehensive rehabilitation program, you will be expected to participate in at least 3 hours of rehabilitation therapies Monday-Friday, with modified therapy programming on the weekends.  Your rehabilitation program will include the following services:  Physical Therapy (PT), Occupational Therapy (OT), 24 hour per day rehabilitation nursing, Therapeutic Recreaction (TR), Psychology, Neuropsychology, Care Coordinator, Rehabilitation Medicine, [place], and Other  Weekly team conferences will be held on Wednesdays to discuss your progress.  Your Inpatient Rehabilitation Care Coordinator will talk with you frequently to get your input and to update you on team discussions.  Team conferences with you and your family in attendance may also be held.  Expected length of stay: 7-10 days    Overall anticipated outcome: Supervision to Independent with Assistive Device  Depending on your progress and recovery, your program may change. Your Inpatient Rehabilitation Care Coordinator will coordinate services and will keep you informed of any changes. Your Inpatient Rehabilitation Care Coordinator's name and contact numbers are listed  below.  The following services may also be recommended but are not provided by the [facility] will be made to provide these services after discharge if needed.  Arrangements include referral to agencies that provide these services.  Your insurance has been verified to be:  [employer]  Your primary doctor is:  [name]  Pertinent information will be shared with your doctor and your insurance company.  Inpatient Rehabilitation Care Coordinator:  Erlene Quan, [place] or [phone]  Information discussed with and copy given to patient by: Rana Snare, 11/21/2021, 8:24 AM

## 2021-11-21 NOTE — Progress Notes (Signed)
Physical Therapy Session Note  Patient Details  Name: Justin [name] MRN: [id number] Date of Birth: 16-Jan-1956  Today's Date: 11/21/2021 PT Individual Time:  [id number] PT Individual Time Calculation (min): 75 min - 60 min     Short Term Goals: Week 1:  PT Short Term Goal 1 (Week 1): Pt will perfrom transfers with Supervision assist with LRAD (STG=LGT) PT Short Term Goal 2 (Week 1): Pt will ambulate 152f with supervision assist  with LRAD PT Short Term Goal 3 (Week 1): Pt will perfrom car transfer with Min/Mod A with LRAD PT Short Term Goal 4 (Week 1): Pt will perfrom chair/bed transfers CGA with LRAD PT Short Term Goal 5 (Week 1): PT will perfrom bed mobility with Supervision assist   Session one  Skilled Therapeutic Interventions/Progress Updates:  Pt seated at EOB on entrance. Pt alert and agreeable for treatment session. Pt reports no changes in pain levels from previous treatment session. Pt provided with ted hose on right foot for edema and foam dressing on left heal for pressure sore. Pt transported to and from therapy gym in wheelchair for time management.  Therapeutic activity: Transfers: Pt  performed sit to stand with min A from EOB using RW and demonstrated stand pivot transfer with CGA to wheelchair. Pt continues to show confidence in performing stand pivot tranfers using RW with CGA throughout treatment session. Pt transferred to stationary car with min/mod assist requiring assistance to lift left leg in and out while adhering to hip precautions.  Stairs: Pt performed step up/down progression (4 x 8) at the parallel bars using 4 inch step with CGA/ Min A. Provided verbal and tactile cuing for step sequence, and maintaining upright posture. Transitioned to stairs in therapy gym, Pt completed one attempt  on 4 inch staircase using both hand rails and required CGA during elevation and Min A during decent.  Neuromuscular Re-education: NMR performed for  improvement in motor control, endurance, coordination, balance, judgement, and sequencing in all aspects of mobility at the highest level of independence.  Balance: Performed at the high mat table using RW with CGA. Patient performed alternating hand raises (2 x 8 ea) while holding on to RW, static balance hold (2 x 60 sec) and eyes closed (2x10 sec) without external support from RW. Provided verbal and tactile cuing for maintaining upright posture.   BITS: Pt performed one attempt of visual scanning with letters using RW and required CGA for static balance hold (1 min 46 sec). Pt struggled to maintain sequence and required moderate amount of cuing for letter retention.  Pt fatigues quickly and requires frequent rest breaks during treatment session. Pt occasionally becomes light headed/ dizzy when transitioning from seated position to standing and needs a few seconds to adjust before ambulating.  Pt seated upright in recliner with belt alarm on, bell & phone in reach, and all needs met.  Session Two:   Skilled Therapeutic Interventions/Progress Updates:  Pt seated upright in recliner on entrance. Pt alert and agreeable for treatment session. Pt reports no changes in pain levels from previous treatment session. Pt provided with ted hose on right foot for edema and foam dressing on left heal for pressure sore. Pt transported to and from therapy gym in wheelchair for time management.  Therapeutic Activity: Transfers: Pt performed stand and pivot transfer from recliner to wheelchair with with contact guard assist.   LE Strengthening: Pt performed seated leg extensions (2 x 10 each, supine long arc quad sets (3 x 8  each, 3rd set performed w/ 1lb ankle weights BLE) supine isometric abduction holds ( 3 sec hold w/ manual resistance) and modified glute bridges ( 3 x 10). Pt was provided for verbal and tactile cuing to maintain muscle contractions  and attention during activity. Pt was position supine  with two pillows behind head and blue cylinder under knees. Pt was provided with manual support for left heel during modified glute bridges and instructed only to release pressure off hips from mat table in order to maintain anterior hip precautions  Neuromuscular Re-education: NMR performed for improvement in motor control,strengthening, endurance, coordination, balance, judgement, and sequencing in all aspects of mobility at the highest level of independence.  Balance: Static standing balance with alternating hand raises (4x30 sec) and eyes closed (4x10 sec) were performed with CGA using rolling walker. Pt was provided with verbal and tactile cuing to maintain upright posture.  At parallel bars: Forwards and backwards walking X 4 were performed with patient placing both hands on bars for support and with contact guard assist. Pt was provided with verbal and tactile cuing for maintaining upright posture and glute activation. Pt performed one attempt (1 x 2 ea) of standing straight leg hip extensions, Pt mentioned having increase in hip discomfort. Switched to standing straight leg isometric hip extensions (2 x 4 ea w/ 3 sec hold) to which pt tolerated better.  Pt continues to fatigue quickly and requires frequent rest breaks during treatment session. Pt mentioned having decrease in body temperature and was given warm blanket to compensate during treatment session. Pt mentioned feeling light headed and dizzy after transitioning from supine edge of mat table. Blood pressure was recorded in seated position (111/62) and standing (119/58).  Pt seated upright in recliner with belt alarm on, bell & phone in reach, and all needs met.     Therapy Documentation Precautions:  Precautions Precautions: Fall Restrictions Weight Bearing Restrictions: No LUE Weight Bearing: Weight bearing as tolerated LLE Weight Bearing: Weight bearing as tolerated Other Position/Activity Restrictions: anterior hip  precautions   Pain: Pain Assessment Pain Scale: 0-10 Pain Score: 5  Pain Type: Chronic pain Pain Location: Hip Pain Orientation: Posterior Pain Descriptors / Indicators: Aching Pain Frequency: Intermittent Pain Onset: On-going Patients Stated Pain Goal: 2 Pain Intervention(s): Medication (See eMAR)  Therapy/Group: Individual Therapy  Hilary Hertz PT, SPT  Hilary Hertz 11/21/2021, 9:34 AM

## 2021-11-21 NOTE — Progress Notes (Signed)
Occupational Therapy Session Note  Patient Details  Name: Evelio Rueda MRN: [id number] Date of Birth: 01-01-1956  Today's Date: 11/21/2021 OT Individual Time: 1300-1330 OT Individual Time Calculation (min): 30 min    Short Term Goals: Week 1:  OT Short Term Goal 1 (Week 1): STGs = LTGs  Skilled Therapeutic Interventions/Progress Updates:     Pt received in room resting in wc with Rn present administering medications. Pt agreeable to OT session with a focus on ADL retraining. Pt reporting fatigue and mild pain in hip prior to session, but Pt motivated to participate. Pt reported need to use restroom upon OT arrival. Pt sit>stand with RW CGA. Pt ambulated to the bathroom and completed toieleting on elevated toilet seat with CGA RW. Pt required increased time during toileting d/t urge to have bowel movement, but was unsuccessful despite efforts. Pt ambulated to sink where he washed his hands and face, completed UB bathing, and donned deodorant with CGA standing with RW. Pt ambulated back to his recliner RW CGA and was left resting in recliner with call bell in reach, seat belt alarm on, and all needs met. Pt positioned with cushion under his bottom to decrease pain.   Therapy Documentation Precautions:  Precautions Precautions: Fall Restrictions Weight Bearing Restrictions: No LUE Weight Bearing: Weight bearing as tolerated LLE Weight Bearing: Weight bearing as tolerated Other Position/Activity Restrictions: anterior hip precautions General:   Vital Signs: Therapy Vitals Temp: 97.8 F (36.6 C) Pulse Rate: 62 Resp: 17 BP: 124/84 Oxygen Therapy SpO2: 99 % O2 Device: Room Air    Therapy/Group: Individual Therapy  Janey Genta 11/21/2021, 4:43 PM

## 2021-11-22 DIAGNOSIS — G9341 Metabolic encephalopathy: Secondary | ICD-10-CM

## 2021-11-22 DIAGNOSIS — M1612 Unilateral primary osteoarthritis, left hip: Secondary | ICD-10-CM

## 2021-11-22 DIAGNOSIS — R339 Retention of urine, unspecified: Secondary | ICD-10-CM

## 2021-11-22 NOTE — Progress Notes (Signed)
Occupational Therapy Session Note  Patient Details  Name: Justin [name] MRN: [id number] Date of Birth: [birth year]  Today's Date: 11/22/2021 OT Individual Time: 0145-0230 OT Individual Time Calculation (min): 45 min    Short Term Goals: Week 1:  OT Short Term Goal 1 (Week 1): STGs = LTGs  Skilled Therapeutic Interventions/Progress Updates:    The pt was in bed upon arrival, he agreed to UB exercise to improve functional independence for a safe return home.  The pt was able to complete sit to stands using the RW for additional balance at [place] . The pt was able to tolerate towel exercise for shld flexion, horizontal abduction, and shld rotation 2 sets of 15 with 1 break between sets.  The pt completed resistive exercises to improve  BUB strength for safe and independent task performance with BADL's and with functional mobility 5x. The pt completed the session coming from sit to stand , folding towel at the bedside table with with close S. At OT treatment, the pt returned to bed LOF with his bedside table and call light within reach and his bed alarm activated. All additional needs were address with no c/o pain during this treatment session.  Therapy Documentation Precautions:  Precautions Precautions: None Restrictions Weight Bearing Restrictions: No LUE Weight Bearing: Weight bearing as tolerated LLE Weight Bearing: Weight bearing as tolerated Other Position/Activity Restrictions: anterior hip precautions  Therapy/Group: Individual Therapy  Yvonne Kendall 11/22/2021, 3:59 PM

## 2021-11-22 NOTE — Progress Notes (Signed)
Physical Therapy Session Note  Patient Details  Name: Justin [name] MRN: [id number] Date of Birth: 01-17-1956  Today's Date: 11/22/2021 PT Individual Time: 9747 - 1212 PT individual Time Calculation ( min): 57 min   Short Term Goals: Week 1:  PT Short Term Goal 1 (Week 1): Pt will perfrom transfers with Supervision assist with LRAD (STG=LGT) PT Short Term Goal 2 (Week 1): Pt will ambulate 146f with supervision assist  with LRAD PT Short Term Goal 3 (Week 1): Pt will perfrom car transfer with Min/Mod A with LRAD PT Short Term Goal 4 (Week 1): Pt will perfrom chair/bed transfers CGA with LRAD PT Short Term Goal 5 (Week 1): PT will perfrom bed mobility with Supervision assist  Skilled Therapeutic Interventions/Progress Updates:  Pt seated upright in recliner on entrance. Pt alert and agreeable for treatment session. Pt reports no changes in pain levels from previous treatment session.  Pt provided with ted hose on right foot for edema, foam dressing was already present on left heal for pressure sore.  Pt mentioned having discomfort in left heel from wound care earlier.   Pt transported to and from therapy gym in wheelchair for time management.  Therapeutic Activity:  Transfers: Pt  required min A assist  for sit stand from recliner using RW and demonstrated stand pivot transfer to wheelchair with CGA.  Pt performed sit<>stand using RW with CGA. Pt continues to show confidence with stand pivot transfers and ambulatory transfers throughout treatment session with CGA.  LE Strengthening exercise: Pt demonstrated sit to stands (2 x 6), and straight leg isometric hip extensions( 3x5 ea w/ 3 sec hold) against wheel chair with CGA. Pt performed seated leg extensions (2x 5 ea) and hamstring curls (2 x 5 ea) using red resistance band. Performed hip Ab isometric holds(3 sec) w/manual resistance (2 x 10) a high mat table. Provided cuing for sustained muscle contractions and full range of motion.  Patient demonstrated standing straight leg hip extensions w/ red band around ankles (1x4 ea) at parallel bars with CGA. Discontinued progression due to increase discomfort in left ankle and heel.   Neuromuscular Re-Education NMR performed for improvement in motor control, endurance, coordination, balance, judgement, and sequencing in all aspects of mobility at the highest level of independence   Balance: Pt performed alternating hand raises (3 x 30 sec), and weighted (1 lb) ball raises (2x6) in parallel bars with CGA. Provided verbal and tactile cuing for maintaining upright posture.  Pt continues to fatigue quickly and requires frequent rest breaks during treatment session. Pt showed decrease in weight bearing tolerance today due to left foot discomfort.   Pt supine in bed with alarm on, bell& phone in reach and all needs met.   Therapy Documentation Precautions:  Precautions Precautions: Fall Restrictions Weight Bearing Restrictions: No LUE Weight Bearing: Weight bearing as tolerated LLE Weight Bearing: Weight bearing as tolerated Other Position/Activity Restrictions: anterior hip precautions General:   Vital Signs: Therapy Vitals Temp: 98.4 F (36.9 C) Pulse Rate: 91 Resp: 16 BP: (!) 144/97 Patient Position (if appropriate): Sitting Oxygen Therapy SpO2: 99 % O2 Device: Room Air  Therapy/Group: Individual Therapy  EHilary HertzPT, SPT   EHilary Hertz11/04/2021, 1:27 PM

## 2021-11-22 NOTE — Progress Notes (Signed)
Physical Therapy Session Note  Patient Details  Name: Justin [name] MRN: [id number] Date of Birth: [birth year]  Today's Date: 11/22/2021 PT Individual Time: 9024-0973 PT Individual Time Calculation (min): 59 min   Short Term Goals: Week 1:  PT Short Term Goal 1 (Week 1): Pt will perfrom transfers with Supervision assist with LRAD (STG=LGT) PT Short Term Goal 2 (Week 1): Pt will ambulate 157f with supervision assist  with LRAD PT Short Term Goal 3 (Week 1): Pt will perfrom car transfer with Min/Mod A with LRAD PT Short Term Goal 4 (Week 1): Pt will perfrom chair/bed transfers CGA with LRAD PT Short Term Goal 5 (Week 1): PT will perfrom bed mobility with Supervision assist   Skilled Therapeutic Interventions/Progress Updates:   Pt received sitting in recliner and agreeable to PT. Pt performed stand pivot transfer with RW and min assist for push from low sitting surface PT for safety.   Sit<>stand x 8 with supervision  assist-CGA with cues for UE placement for improved safety   Standing 30 sec with alternating hand raise. Pt reports incontinence of bowel.   Transported back to room. [facility]. Pt able to remove pants from waist. Max assist to doff/don pants off bil feet and manage the foley  bag. Hygiene performed by PT. Pt noted to have additional continent BM while sitting on toilet. RN aware   [place]  BITS: visual scanning user paced : seated 1 bouts x 1:00. sequence numbers x 2  - 1:30. Standing  Standing: visual scanning sequence number x 1:30, letter x 1:30 and letter/number x 1:30.  Intermittent cues for sequence retention. Supervision assist for safety with 1 UE supported on RW throughout standing balance/cognition [facility] with cues for anterior weight shift.   Patient returned to room and performed stand  pivot to EOB with RW and CGA for safety. Pt left sitting in recliner with call bell in reach and all needs met.        Therapy Documentation Precautions:  Precautions Precautions: None Restrictions Weight Bearing Restrictions: No LUE Weight Bearing: Weight bearing as tolerated LLE Weight Bearing: Weight bearing as tolerated Other Position/Activity Restrictions: anterior hip precautions   Pain: Pain Assessment Pain Scale: 0-10 Pain Score: 0-No pain   Therapy/Group: Individual Therapy  ALorie Phenix11/04/2021, 5:48 PM
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
## 2021-11-22 NOTE — Progress Notes (Signed)
PROGRESS NOTE   Subjective/Complaints:  Slept ok. No new complaints. Asked if IV could come out.   ROS: Patient denies fever, rash, sore throat, blurred vision, dizziness, nausea, vomiting, diarrhea, cough, shortness of breath or chest pain, joint or back/neck pain, headache, or mood change.    Objective:   No results found. Recent Labs    11/20/21 0608  WBC 31.2*  HGB 9.6*  HCT 30.1*  PLT 174   Recent Labs    11/20/21 0608  NA 133*  K 3.9  CL 101  CO2 24  GLUCOSE 90  BUN 23  CREATININE 1.00  CALCIUM 8.3*    Intake/Output Summary (Last 24 hours) at 11/22/2021 8453 Last data filed at 11/22/2021 6468 Gross per 24 hour  Intake 237 ml  Output 2250 ml  Net -2013 ml     Pressure Injury 11/17/21 Heel Left Unstageable - Full thickness tissue loss in which the base of the injury is covered by slough (yellow, tan, gray, green or brown) and/or eschar (tan, brown or black) in the wound bed. pressure injury (Active)  11/17/21 0612  Location: Heel  Location Orientation: Left  Staging: Unstageable - Full thickness tissue loss in which the base of the injury is covered by slough (yellow, tan, gray, green or brown) and/or eschar (tan, brown or black) in the wound bed.  Wound Description (Comments): pressure injury  Present on Admission: Yes (present on transfer to 4E/Urology)     Pressure Injury 11/19/21 Coccyx Mid;Lower Stage 2 -  Partial thickness loss of dermis presenting as a shallow open injury with a red, pink wound bed without slough. (Active)  11/19/21 1625  Location: Coccyx  Location Orientation: Mid;Lower  Staging: Stage 2 -  Partial thickness loss of dermis presenting as a shallow open injury with a red, pink wound bed without slough.  Wound Description (Comments):   Present on Admission: Yes     Pressure Injury 11/19/21 Sacrum Left Stage 2 -  Partial thickness loss of dermis presenting as a shallow open injury  with a red, pink wound bed without slough. (Active)  11/19/21 1625  Location: Sacrum  Location Orientation: Left  Staging: Stage 2 -  Partial thickness loss of dermis presenting as a shallow open injury with a red, pink wound bed without slough.  Wound Description (Comments):   Present on Admission: Yes     Pressure Injury 11/19/21 Sacrum Right Stage 2 -  Partial thickness loss of dermis presenting as a shallow open injury with a red, pink wound bed without slough. (Active)  11/19/21 1625  Location: Sacrum  Location Orientation: Right  Staging: Stage 2 -  Partial thickness loss of dermis presenting as a shallow open injury with a red, pink wound bed without slough.  Wound Description (Comments):   Present on Admission: Yes    Physical Exam: Vital Signs Blood pressure 95/66, pulse 88, temperature 97.9 F (36.6 C), temperature source Oral, resp. rate 16, height '5\' 7"'$  (1.702 m), weight 71.1 kg, SpO2 96 %.   Constitutional: No distress . Vital signs reviewed. HEENT: NCAT, EOMI, oral membranes moist Neck: supple Cardiovascular: RRR without murmur. No JVD    Respiratory/Chest: CTA Bilaterally  without wheezes or rales. Normal effort    GI/Abdomen: BS +, non-tender, non-distended Ext: no clubbing, cyanosis, or edema Psych: pleasant and cooperative  Skin: sacral, coccyx, heel issues as above Neurologic: Cranial nerves II through XII intact, motor strength is 5/5 in bilateral deltoid, bicep, tricep, grip, hip flexor, knee extensors, ankle dorsiflexor and plantar flexor Sensory exam normal sensation to light touch and proprioception in bilateral upper and lower extremities Cerebellar exam normal finger to nose to finger as well as heel to shin in bilateral upper and lower extremities Musculoskeletal: Full range of motion in all 4 extremities. No joint swelling   Assessment/Plan: 1. Functional deficits which require 3+ hours per day of interdisciplinary therapy in a comprehensive inpatient  rehab setting. Physiatrist is providing close team supervision and 24 hour management of active medical problems listed below. Physiatrist and rehab team continue to assess barriers to discharge/monitor patient progress toward functional and medical goals  Care Tool:  Bathing    Body parts bathed by patient: Right arm, Left arm, Chest, Abdomen, Front perineal area, Buttocks, Right upper leg, Left upper leg, Face   Body parts bathed by helper: Right lower leg, Left lower leg     Bathing assist Assist Level: Minimal Assistance - Patient > 75%     Upper Body Dressing/Undressing Upper body dressing   What is the patient wearing?: Pull over shirt    Upper body assist Assist Level: Set up assist    Lower Body Dressing/Undressing Lower body dressing      What is the patient wearing?: Pants     Lower body assist Assist for lower body dressing: Minimal Assistance - Patient > 75%     Toileting Toileting    Toileting assist Assist for toileting: Contact Guard/Touching assist     Transfers Chair/bed transfer  Transfers assist     Chair/bed transfer assist level: Contact Guard/Touching assist     Locomotion Ambulation   Ambulation assist      Assist level: Minimal Assistance - Patient > 75% Assistive device: Walker-rolling Max distance: 55   Walk 10 feet activity   Assist     Assist level: Minimal Assistance - Patient > 75% Assistive device: Walker-rolling   Walk 50 feet activity   Assist    Assist level: Minimal Assistance - Patient > 75% Assistive device: Walker-rolling    Walk 150 feet activity   Assist Walk 150 feet activity did not occur: Safety/medical concerns         Walk 10 feet on uneven surface  activity   Assist Walk 10 feet on uneven surfaces activity did not occur: Safety/medical concerns         Wheelchair     Assist Is the patient using a wheelchair?: Yes Type of Wheelchair: Manual    Wheelchair assist level:  Supervision/Verbal cueing Max wheelchair distance: 100    Wheelchair 50 feet with 2 turns activity    Assist        Assist Level: Supervision/Verbal cueing   Wheelchair 150 feet activity     Assist      Assist Level: Moderate Assistance - Patient 50 - 74%   Blood pressure 95/66, pulse 88, temperature 97.9 F (36.6 C), temperature source Oral, resp. rate 16, height '5\' 7"'$  (1.702 m), weight 71.1 kg, SpO2 96 %.  Medical Problem List and Plan: 1. Functional deficits secondary to Left total hip replacement anterior approach 16/10/9602 complicated by acute metabolic encephalopathy.  Weightbearing as tolerated.Marland Kitchen             -  patient may shower             -ELOS/Goals: 7-9 days S CIR 2.  Antithrombotics: -DVT/anticoagulation:  Pharmaceutical: Eliquis             -antiplatelet therapy: N/A 3. Postoperative pain: continue Robaxin and oxycodone as needed 4. Mood/Behavior/Sleep: Provide emotional support             -antipsychotic agents: N/A 5. Neuropsych/cognition: This patient is capable of making decisions on his own behalf. 6.  Left heel pressure ulcer.  Continue Prevalon boot.  Paint left heel wound with Betadine daily, allowed to air dry.  Routine skin checks 7. Fluids/Electrolytes/Nutrition: Routine in and outs with follow-up chemistries 8.  Acute blood loss anemia with history of essential thrombocytopenia/coagulopathy/leukocytosis.  Maintained on Hydrea. follow-up hematology services.    Latest Ref Rng & Units 11/20/2021    6:08 AM 11/19/2021    4:58 AM 11/18/2021    5:13 AM  CBC  WBC 4.0 - 10.5 K/uL 31.2  32.2  26.3   Hemoglobin 13.0 - 17.0 g/dL 9.6  10.2  10.2   Hematocrit 39.0 - 52.0 % 30.1  32.6  32.1   Platelets 150 - 400 K/uL 174  185  197     -labs appear stable 9.  Aortic stenosis with aortic valve replacement/CABG 01/03/2018.  Continue home Eliquis 10.  Atrial fibrillation.  Cardiac rate controlled.  Continue Lopressor 25 mg twice daily.  Continue  Eliquis Vitals:   11/22/21 0012 11/22/21 0410  BP:  95/66  Pulse:  88  Resp:  16  Temp:  97.9 F (36.6 C)  SpO2: 96% 96%    11.  Dural venous sinus thrombosis.  Confirmed by CT venogram 10/23.  Patient has been transitioned back to Eliquis 12.  Acute metabolic encephalopathy.  EEG negative.  Ammonia levels within normal limits.  CT MRI unremarkable. 13.  Acute on chronic systolic congestive heart failure.  Monitor for any signs of fluid overload Filed Weights   11/19/21 1500 11/22/21 0437  Weight: 75.7 kg 71.1 kg   Pretib edema , check daily weights, will give lasix '40mg'$  po x 1  11/4 weights down--follow for trend 14.  BPH/urinary retention.  Flomax has been resumed 0.4 mg daily.  Foley catheter tube removed 11/11/2021 patient noted to have failed voiding trial Foley tube reinserted 11/12/2021 and would remain in place.DO NOT REMOVE FOLEY.  Plan follow-up outpatient with Dr. Louis Meckel urology services 15.  Hyperlipidemia.  Crestor 16.  GERD.  Protonix  17.  Mild cellulitis L heel- Keflex x 7d  LOS: 3 days A FACE TO FACE EVALUATION WAS PERFORMED  Meredith Staggers 11/22/2021, 9:37 AM

## 2021-11-24 MED ORDER — HYDROCERIN EX CREA
TOPICAL_CREAM | Freq: Two times a day (BID) | CUTANEOUS | Status: DC | PRN
  Filled 2021-11-24 (×2): qty 113

## 2021-11-24 MED ORDER — POLYVINYL ALCOHOL 1.4 % OP SOLN: 1.0000 [drp] | OPHTHALMIC | Status: DC | PRN

## 2021-11-24 NOTE — Progress Notes (Signed)
Occupational Therapy Session Note  Patient Details  Name: Justin [name] MRN: [id number] Date of Birth: May 21, 1955  Today's Date: 11/24/2021 OT Individual Time: 8264-1583 OT Individual Time Calculation (min): 75 min    Short Term Goals: Week 1:  OT Short Term Goal 1 (Week 1): STGs = LTGs  Skilled Therapeutic Interventions/Progress Updates:    Pt received in room agreeable to a shower.  Worked on sit to stands from EOB with a focus on pushing up with B hands and bring R foot back under knee to allow him to stand with close S.  Ambulated with RW to shower stall with close S, stepped back into no threshold shower to sit on seat with S. Pt assisted with doffing no slip socks and then he doffed pants with S.   Pt set up in shower with long sponge and bathed self with set up only even with standing in shower to wash bottom. Pt dried off and applied lotion to legs and arms. Noticed that post shower the skin on top of his feet looked like red scratch marks. Informed RN and she looked at them.  Pt needed some A to pull catheter tube through pant leg but able to don pants with S.  He was able to start socks over toes but needed help to finish pulling up. Discussed using sock aid but pt felt if he was sitting on a lower surface he would be able to do it more easily. Will try the recliner tomorrow.  Pt ambulated with RW in room with close S to light CGA.  Pt opted to sit in recliner at end of session with feet elevated. Pt in room with all needs met.   Therapy Documentation Precautions:  Precautions Precautions: None Restrictions Weight Bearing Restrictions: Yes LUE Weight Bearing: Weight bearing as tolerated LLE Weight Bearing: Weight bearing as tolerated Other Position/Activity Restrictions: anterior hip precautions   Pain: Pain Assessment Pain Scale: 0-10 Pain Score: 2  Pain Type: Acute pain Pain Location: Back Pain Orientation: Posterior Pain Radiating Towards: Left foot Pain  Descriptors / Indicators: Burning;Aching Pain Frequency: Intermittent Pain Onset: On-going Patients Stated Pain Goal: 2 Pain Intervention(s): Medication (See eMAR) Multiple Pain Sites: No ADL: ADL Eating: Independent Grooming: Setup Upper Body Bathing: Setup Where Assessed-Upper Body Bathing: Shower Lower Body Bathing: Supervision/safety Where Assessed-Lower Body Bathing: Shower Upper Body Dressing: Setup Lower Body Dressing:  (min for socks) Toileting: Contact guard Where Assessed-Toileting: Glass blower/designer: Lawyer: Raised toilet seat Social research officer, government: Curator Method: Heritage manager: Grab bars, Shower seat with back   Therapy/Group: Individual Therapy  [place] 11/24/2021, 12:23 PM

## 2021-11-24 NOTE — Progress Notes (Signed)
PROGRESS NOTE   Subjective/Complaints:  No issues overnite  ROS: neg CP, SOB, N/V/D    Objective:   No results found. No results for input(s): "WBC", "HGB", "HCT", "PLT" in the last 72 hours.  No results for input(s): "NA", "K", "CL", "CO2", "GLUCOSE", "BUN", "CREATININE", "CALCIUM" in the last 72 hours.   Intake/Output Summary (Last 24 hours) at 11/24/2021 0825 Last data filed at 11/24/2021 0300 Gross per 24 hour  Intake 472 ml  Output 1800 ml  Net -1328 ml      Pressure Injury 11/17/21 Heel Left Unstageable - Full thickness tissue loss in which the base of the injury is covered by slough (yellow, tan, gray, green or brown) and/or eschar (tan, brown or black) in the wound bed. pressure injury (Active)  11/17/21 0612  Location: Heel  Location Orientation: Left  Staging: Unstageable - Full thickness tissue loss in which the base of the injury is covered by slough (yellow, tan, gray, green or brown) and/or eschar (tan, brown or black) in the wound bed.  Wound Description (Comments): pressure injury  Present on Admission: Yes (present on transfer to 4E/Urology)     Pressure Injury 11/19/21 Coccyx Mid;Lower Stage 2 -  Partial thickness loss of dermis presenting as a shallow open injury with a red, pink wound bed without slough. (Active)  11/19/21 1625  Location: Coccyx  Location Orientation: Mid;Lower  Staging: Stage 2 -  Partial thickness loss of dermis presenting as a shallow open injury with a red, pink wound bed without slough.  Wound Description (Comments):   Present on Admission: Yes     Pressure Injury 11/19/21 Sacrum Left Stage 2 -  Partial thickness loss of dermis presenting as a shallow open injury with a red, pink wound bed without slough. (Active)  11/19/21 1625  Location: Sacrum  Location Orientation: Left  Staging: Stage 2 -  Partial thickness loss of dermis presenting as a shallow open injury with a red,  pink wound bed without slough.  Wound Description (Comments):   Present on Admission: Yes     Pressure Injury 11/19/21 Sacrum Right Stage 2 -  Partial thickness loss of dermis presenting as a shallow open injury with a red, pink wound bed without slough. (Active)  11/19/21 1625  Location: Sacrum  Location Orientation: Right  Staging: Stage 2 -  Partial thickness loss of dermis presenting as a shallow open injury with a red, pink wound bed without slough.  Wound Description (Comments):   Present on Admission: Yes    Physical Exam: Vital Signs Blood pressure 116/61, pulse 81, temperature 97.7 F (36.5 C), temperature source Oral, resp. rate 18, height '5\' 7"'$  (1.702 m), weight 71.3 kg, SpO2 94 %.    General: No acute distress Mood and affect are appropriate Heart: Regular rate and rhythm no rubs murmurs or extra sounds Lungs: Clear to auscultation, breathing unlabored, no rales or wheezes Abdomen: Positive bowel sounds, soft nontender to palpation, nondistended Extremities: No clubbing, cyanosis, or edema   Skin: sacral, coccyx, heel issues as above Neurologic: A & O x 3 , motor strength is 5/5 in bilateral deltoid, bicep, tricep, grip, hip flexor, knee extensors, ankle dorsiflexor and plantar  flexor Sensory exam normal sensation to light touch and proprioception in bilateral upper and lower extremities Cerebellar exam normal finger to nose to finger as well as heel to shin in bilateral upper and lower extremities Musculoskeletal: Full range of motion in all 4 extremities. No joint swelling   Assessment/Plan: 1. Functional deficits which require 3+ hours per day of interdisciplinary therapy in a comprehensive inpatient rehab setting. Physiatrist is providing close team supervision and 24 hour management of active medical problems listed below. Physiatrist and rehab team continue to assess barriers to discharge/monitor patient progress toward functional and medical goals  Care  Tool:  Bathing    Body parts bathed by patient: Right arm, Left arm, Chest, Abdomen, Front perineal area, Buttocks, Right upper leg, Left upper leg, Face   Body parts bathed by helper: Right lower leg, Left lower leg     Bathing assist Assist Level: Minimal Assistance - Patient > 75%     Upper Body Dressing/Undressing Upper body dressing   What is the patient wearing?: Pull over shirt    Upper body assist Assist Level: Set up assist    Lower Body Dressing/Undressing Lower body dressing      What is the patient wearing?: Pants     Lower body assist Assist for lower body dressing: Minimal Assistance - Patient > 75%     Toileting Toileting    Toileting assist Assist for toileting: Contact Guard/Touching assist     Transfers Chair/bed transfer  Transfers assist     Chair/bed transfer assist level: Contact Guard/Touching assist     Locomotion Ambulation   Ambulation assist      Assist level: Minimal Assistance - Patient > 75% Assistive device: Walker-rolling Max distance: 55   Walk 10 feet activity   Assist     Assist level: Minimal Assistance - Patient > 75% Assistive device: Walker-rolling   Walk 50 feet activity   Assist    Assist level: Minimal Assistance - Patient > 75% Assistive device: Walker-rolling    Walk 150 feet activity   Assist Walk 150 feet activity did not occur: Safety/medical concerns         Walk 10 feet on uneven surface  activity   Assist Walk 10 feet on uneven surfaces activity did not occur: Safety/medical concerns         Wheelchair     Assist Is the patient using a wheelchair?: Yes Type of Wheelchair: Manual    Wheelchair assist level: Supervision/Verbal cueing Max wheelchair distance: 100    Wheelchair 50 feet with 2 turns activity    Assist        Assist Level: Supervision/Verbal cueing   Wheelchair 150 feet activity     Assist      Assist Level: Moderate Assistance -  Patient 50 - 74%   Blood pressure 116/61, pulse 81, temperature 97.7 F (36.5 C), temperature source Oral, resp. rate 18, height '5\' 7"'$  (1.702 m), weight 71.3 kg, SpO2 94 %.  Medical Problem List and Plan: 1. Functional deficits secondary to Left total hip replacement anterior approach 58/52/7782 complicated by acute metabolic encephalopathy.  Weightbearing as tolerated..             -patient may shower             -ELOS/Goals: 7-9 days S- set date on Wednesday CIR PT, OT, SLP 2.  Antithrombotics: -DVT/anticoagulation:  Pharmaceutical: Eliquis             -antiplatelet therapy: N/A 3. Postoperative  pain: continue Robaxin and oxycodone as needed 4. Mood/Behavior/Sleep: Provide emotional support             -antipsychotic agents: N/A 5. Neuropsych/cognition: This patient is capable of making decisions on his own behalf. 6.  Left heel pressure ulcer.  Continue Prevalon boot.  Paint left heel wound with Betadine daily, allowed to air dry.  Routine skin checks 7. Fluids/Electrolytes/Nutrition: Routine in and outs with follow-up chemistries 8.  Acute blood loss anemia with history of essential thrombocytopenia/coagulopathy/leukocytosis.  Maintained on Hydrea. follow-up hematology services.    Latest Ref Rng & Units 11/20/2021    6:08 AM 11/19/2021    4:58 AM 11/18/2021    5:13 AM  CBC  WBC 4.0 - 10.5 K/uL 31.2  32.2  26.3   Hemoglobin 13.0 - 17.0 g/dL 9.6  10.2  10.2   Hematocrit 39.0 - 52.0 % 30.1  32.6  32.1   Platelets 150 - 400 K/uL 174  185  197     -labs appear stable 9.  Aortic stenosis with aortic valve replacement/CABG 01/03/2018.  Continue home Eliquis 10.  Atrial fibrillation.  Cardiac rate controlled.  Continue Lopressor 25 mg twice daily.  Continue Eliquis Vitals:   11/23/21 1926 11/24/21 0323  BP: 114/84 116/61  Pulse: 88 81  Resp: 18 18  Temp: 97.6 F (36.4 C) 97.7 F (36.5 C)  SpO2: 96% 94%    11.  Dural venous sinus thrombosis.  Confirmed by CT venogram 10/23.   Patient has been transitioned back to Eliquis 12.  Acute metabolic encephalopathy.  EEG negative.  Ammonia levels within normal limits.  CT MRI unremarkable. 13.  Acute on chronic systolic congestive heart failure.  Monitor for any signs of fluid overload Filed Weights   11/22/21 0437 11/23/21 0307 11/24/21 0323  Weight: 71.1 kg 72.1 kg 71.3 kg   Pretib edema , check daily weights, will give lasix '40mg'$  po x 1  11/4 weights down--follow for trend 14.  BPH/urinary retention.  Flomax has been resumed 0.4 mg daily.  Foley catheter tube removed 11/11/2021 patient noted to have failed voiding trial Foley tube reinserted 11/12/2021 and would remain in place.DO NOT REMOVE FOLEY.  Plan follow-up outpatient with Dr. Louis Meckel urology services 15.  Hyperlipidemia.  Crestor 16.  GERD.  Protonix  17.  Mild cellulitis L heel- Keflex completed   LOS: 5 days A FACE TO FACE EVALUATION WAS PERFORMED  Charlett Blake 11/24/2021, 8:25 AM

## 2021-11-24 NOTE — Progress Notes (Signed)
Physical Therapy Session Note  Patient Details  Name: Justin [name] MRN: [id number] Date of Birth: 1955-02-24  Today's Date: 11/24/2021 PT Individual Time: 1303-1401 PT Individual Time Calculation (min): 58 min   Short Term Goals: Week 1:  PT Short Term Goal 1 (Week 1): Pt will perfrom transfers with Supervision assist with LRAD (STG=LGT) PT Short Term Goal 2 (Week 1): Pt will ambulate 120f with supervision assist  with LRAD PT Short Term Goal 3 (Week 1): Pt will perfrom car transfer with Min/Mod A with LRAD PT Short Term Goal 4 (Week 1): Pt will perfrom chair/bed transfers CGA with LRAD PT Short Term Goal 5 (Week 1): PT will perfrom bed mobility with Supervision assist  Skilled Therapeutic Interventions/Progress Updates:  Patient seated upright with BLE elevated in recliner on entrance to room. Patient alert and agreeable to PT session.   Patient with minimal pain complaint at start of session. Pain is described as "bearable" during all mobility.  Therapeutic Activity: Transfers: Pt performed sit<>stand and stand pivot transfers throughout session with close supervision. Provided verbal cues for maintaining forward LLE positioning when controlling descent to sit throughout session.   Neuromuscular Re-ed: NMR facilitated during session with focus onstanding balance, muscle fiber recruitment. Pt guided in toe touches to 4" and 6" steps. Requires vc/ tc for focus to decrease circumduction to reach 6" step and produce pure hip/ knee flexion to reach. Guided in forward flexion into LLE with increased push into extension for NMR and edema mgmt. NMR performed for improvements in motor control and coordination, balance, sequencing, judgement, and self confidence/ efficacy in performing all aspects of mobility at highest level of independence.   Gait Training:  Pt provided with visual goal markers for 40' and 783 in order to provide visual cue for maintaining effort. Pt  ambulated 768 x2  using RW with bilateral step through gait pattern with CGA/ supervision. Provided vc/ tc for upright posture, level gaze.  Pt also guided in curb step training using RW to ascend 4" curb with RLE leading and descend with LLE leading. CGA for first run through. Close supervision with minimal cueing for technique in return pass.   Patient seated upright in recliner with BLE elevated at end of session with brakes locked, no alarm set, and all needs within reach. Pt deemed safe in determining that he requires supervisory assist to ambulate anywhere in room - no belt alarm in place.    Therapy Documentation Precautions:  Precautions Precautions: None Restrictions Weight Bearing Restrictions: Yes LUE Weight Bearing: Weight bearing as tolerated LLE Weight Bearing: Weight bearing as tolerated Other Position/Activity Restrictions: anterior hip precautions General:   Vital Signs: Therapy Vitals Temp: 97.6 F (36.4 C) Temp Source: Oral Pulse Rate: 72 Resp: 18 BP: 116/64 Patient Position (if appropriate): Sitting Oxygen Therapy O2 Device: Room Air Pain:  Pt relates resting pain at 5/10 prior to ambulation and increases to 6.5/ 10 during ambulation.   Therapy/Group: Individual Therapy  JAlger SimonsPT, DPT, CSRS 11/24/2021, 5:27 PM

## 2021-11-24 NOTE — Progress Notes (Signed)
Physical Therapy Session Note  Patient Details  Name: Justin [name] MRN: [id number] Date of Birth: 1955-08-21  Today's Date: 11/24/2021 PT Individual Time: 1032-1100 PT Individual Time Calculation (min): 28 min   Short Term Goals: Week 1:  PT Short Term Goal 1 (Week 1): Pt will perfrom transfers with Supervision assist with LRAD (STG=LGT) PT Short Term Goal 2 (Week 1): Pt will ambulate 161f with supervision assist  with LRAD PT Short Term Goal 3 (Week 1): Pt will perfrom car transfer with Min/Mod A with LRAD PT Short Term Goal 4 (Week 1): Pt will perfrom chair/bed transfers CGA with LRAD PT Short Term Goal 5 (Week 1): PT will perfrom bed mobility with Supervision assist  Skilled Therapeutic Interventions/Progress Updates:  Patient seated upright in recliner with BLE elevated on entrance to room. Patient alert and agreeable to PT session.   Patient with minimal pain complaint at start of session. Rates 4/5 at rest.   Therapeutic Activity: Transfers: Pt performed sit<>stand and stand pivot transfers throughout session with close supervision. Provided verbal cues formore posterior foot placement for improved standing balance attainment.  Gait Training:  Pt ambulated 40' x1/ 45' x1 using RW with CGA/ close supervision. Demonstrated ability to perform partial step through pattern with RLE with increased BUE pressure into RW with very slow pace. Provided vc/ tc for decreasing pressure into BUE with decrease in pain at hip with WB in order to progress. Seated rest break between for pain. Rated 6/10. No pain meds requested at this time as he is not due for pain meds just yet.   Patient seated upright in recliner with BLE elevated at end of session with brakes locked, and all needs within reach.    Therapy Documentation Precautions:  Precautions Precautions: None Restrictions Weight Bearing Restrictions: Yes LUE Weight Bearing: Weight bearing as tolerated LLE Weight Bearing: Weight  bearing as tolerated Other Position/Activity Restrictions: anterior hip precautions General:   Vital Signs:   Pain:  Increased to 6/10 with ambulation. Addressed with rest and LE elevation at end of session.   Therapy/Group: Individual Therapy  JAlger SimonsPT, DPT, CSRS 11/24/2021, 12:59 PM

## 2021-11-25 NOTE — Progress Notes (Signed)
Occupational Therapy Session Note  Patient Details  Name: Justin [name] MRN: [id number] Date of Birth: 12/31/1955  Today's Date: 11/25/2021 OT Individual Time: 6269-4854 session 1 OT Individual Time Calculation (min): 57 min  Session 2: 1121-1211   Short Term Goals: Week 1:  OT Short Term Goal 1 (Week 1): STGs = LTGs  Skilled Therapeutic Interventions/Progress Updates:  Session 1: Pt greeted seated EOB, finishing breakfast, pt  agreeable to OT intervention. Session focus on BADL reeducation, functional mobility, and IADLs in kitchen.   Pt completed sit>stand from EOB with Rw and CGA, pt needed verbal cues for safe hand placement on RW. Pt completed ambulatory toilet transfer with Rw and CGA, pt completed 3/3 toileting tasks with MIN A to reach down and pull up pants with no BM. Pt exited bathroom in same manner. Pt completed UB dressing from EOB with set- up assist. Pt transported to kitchen to practice dynamic balance during IADLs as pt reports that he will do the cooking at home as his wife was in an accident. However, once in the kitchen, set- up kithcen with items in cabinets and instructed pt to retrieve items with pt reporting that he "wont have to do that." Pt reports everything he needs will be out on the cabinets. Unsure of the validity of this statement but graded task per pts request. Pt then reprots he knows he will make a [place] for thanksgiving, set- up planned failure task with pt asked to simulate putting heavy pan into oven, pt reports "I can do that." Talked through compensatory strategies for kitchen safety however pt declined actually practicing simulated kitchen tasks. Education provided on using chair for meal prep tasks or using chair in front of oven to put items in oven. Pr agreeable to task, pt completed functional ambulation back to room with RW and CGA.  pt left seated in recliner with alarm belt activated and all needs within reach.                  Session 2: Pt  greeted seated in recliner, pt requesting a snack. Retrieved peanut butter crackers and pt able to complete meal prep task of sitting, education provided that this therapist would recommend meal prep also be done from sitting at home since pt declined practicing tasks this AM.  Pt speaking a lot about planning for thanksgiving wanting to call his wife and asking how many people are coming for dinner. Pt then abruptly oscillates topics to showing therapist pictures of his dog.  Pt able to be redirected. Pt completed sit>stand from recliner with light MIN A, CGA to pivot to w/c with Rw.  Pt completed 5 mins on scifit at level 3.4 to facilitate improved UB strengthening and overall endurance. VS-  O2- 94 HR- 71 bpm  Pt completed seated Trail making task at [facility] with 2 lb hand weighted donned on RUE to facilitate improved overall strength/endurance.   Pt reports he would be unable to complete same task with LUE as he is "really right handed" therefore pt completed seated dynamic reaching task with LUE with 2lb hand weight donned to work on LUE strength/endurance  Pt reports fatigue after completing task requesting to ride back to room in w/c. Pt left seated in recliner with alarm activated and all needs within reach.   Therapy Documentation Precautions:  Precautions Precautions: None Restrictions Weight Bearing Restrictions: Yes LUE Weight Bearing: Weight bearing as tolerated LLE Weight Bearing: Weight bearing as tolerated Other Position/Activity Restrictions: anterior  hip precautions  Pain: Session 1: 5/10 pain reported in LLE, pain meds provided and rest breaks provided as needed Session2: 5/10 pain reported, rest breaks provided as needed    Therapy/Group: Individual Therapy  [name] 11/25/2021, 12:13 PM

## 2021-11-25 NOTE — Progress Notes (Signed)
PROGRESS NOTE   Subjective/Complaints:   Has some night time pain L hip , no other c/o  Has chronic leg swelling , has TEDs but not wearing this am , takes Lasix and KCL at  home " as needed"   ROS: neg CP, SOB, N/V/D    Objective:   No results found. No results for input(s): "WBC", "HGB", "HCT", "PLT" in the last 72 hours.  No results for input(s): "NA", "K", "CL", "CO2", "GLUCOSE", "BUN", "CREATININE", "CALCIUM" in the last 72 hours.   Intake/Output Summary (Last 24 hours) at 11/25/2021 0754 Last data filed at 11/25/2021 0505 Gross per 24 hour  Intake 700 ml  Output 2300 ml  Net -1600 ml      Pressure Injury 11/17/21 Heel Left Unstageable - Full thickness tissue loss in which the base of the injury is covered by slough (yellow, tan, gray, green or brown) and/or eschar (tan, brown or black) in the wound bed. pressure injury (Active)  11/17/21 0612  Location: Heel  Location Orientation: Left  Staging: Unstageable - Full thickness tissue loss in which the base of the injury is covered by slough (yellow, tan, gray, green or brown) and/or eschar (tan, brown or black) in the wound bed.  Wound Description (Comments): pressure injury  Present on Admission: Yes (present on transfer to 4E/Urology)     Pressure Injury 11/19/21 Coccyx Mid;Lower Stage 2 -  Partial thickness loss of dermis presenting as a shallow open injury with a red, pink wound bed without slough. (Active)  11/19/21 1625  Location: Coccyx  Location Orientation: Mid;Lower  Staging: Stage 2 -  Partial thickness loss of dermis presenting as a shallow open injury with a red, pink wound bed without slough.  Wound Description (Comments):   Present on Admission: Yes     Pressure Injury 11/19/21 Sacrum Left Stage 2 -  Partial thickness loss of dermis presenting as a shallow open injury with a red, pink wound bed without slough. (Active)  11/19/21 1625  Location:  Sacrum  Location Orientation: Left  Staging: Stage 2 -  Partial thickness loss of dermis presenting as a shallow open injury with a red, pink wound bed without slough.  Wound Description (Comments):   Present on Admission: Yes     Pressure Injury 11/19/21 Sacrum Right Stage 2 -  Partial thickness loss of dermis presenting as a shallow open injury with a red, pink wound bed without slough. (Active)  11/19/21 1625  Location: Sacrum  Location Orientation: Right  Staging: Stage 2 -  Partial thickness loss of dermis presenting as a shallow open injury with a red, pink wound bed without slough.  Wound Description (Comments):   Present on Admission: Yes    Physical Exam: Vital Signs Blood pressure (!) 104/56, pulse 78, temperature 98 F (36.7 C), temperature source Oral, resp. rate 18, height '5\' 7"'$  (1.702 m), weight 70.9 kg, SpO2 93 %.    General: No acute distress Mood and affect are appropriate Heart: Regular rate and rhythm no rubs murmurs or extra sounds Lungs: Clear to auscultation, breathing unlabored, no rales or wheezes Abdomen: Positive bowel sounds, soft nontender to palpation, nondistended Extremities: No clubbing, cyanosis, or  edema   Skin: sacral, coccyx, heel issues as above Neurologic: A & O x 3 , motor strength is 5/5 in bilateral deltoid, bicep, tricep, grip, hip flexor, knee extensors, ankle dorsiflexor and plantar flexor Sensory exam normal sensation to light touch and proprioception in bilateral upper and lower extremities Cerebellar exam normal finger to nose to finger as well as heel to shin in bilateral upper and lower extremities Musculoskeletal: Full range of motion in all 4 extremities. No joint swelling   Assessment/Plan: 1. Functional deficits which require 3+ hours per day of interdisciplinary therapy in a comprehensive inpatient rehab setting. Physiatrist is providing close team supervision and 24 hour management of active medical problems listed  below. Physiatrist and rehab team continue to assess barriers to discharge/monitor patient progress toward functional and medical goals  Care Tool:  Bathing    Body parts bathed by patient: Right arm, Left arm, Chest, Abdomen, Front perineal area, Buttocks, Right upper leg, Left upper leg, Face, Right lower leg, Left lower leg   Body parts bathed by helper: Right lower leg, Left lower leg     Bathing assist Assist Level: Set up assist     Upper Body Dressing/Undressing Upper body dressing   What is the patient wearing?: Pull over shirt    Upper body assist Assist Level: Set up assist    Lower Body Dressing/Undressing Lower body dressing      What is the patient wearing?: Pants     Lower body assist Assist for lower body dressing: Set up assist     Toileting Toileting    Toileting assist Assist for toileting: Contact Guard/Touching assist     Transfers Chair/bed transfer  Transfers assist     Chair/bed transfer assist level: Contact Guard/Touching assist     Locomotion Ambulation   Ambulation assist      Assist level: Minimal Assistance - Patient > 75% Assistive device: Walker-rolling Max distance: 55   Walk 10 feet activity   Assist     Assist level: Minimal Assistance - Patient > 75% Assistive device: Walker-rolling   Walk 50 feet activity   Assist    Assist level: Minimal Assistance - Patient > 75% Assistive device: Walker-rolling    Walk 150 feet activity   Assist Walk 150 feet activity did not occur: Safety/medical concerns         Walk 10 feet on uneven surface  activity   Assist Walk 10 feet on uneven surfaces activity did not occur: Safety/medical concerns         Wheelchair     Assist Is the patient using a wheelchair?: Yes Type of Wheelchair: Manual    Wheelchair assist level: Supervision/Verbal cueing Max wheelchair distance: 100    Wheelchair 50 feet with 2 turns activity    Assist         Assist Level: Supervision/Verbal cueing   Wheelchair 150 feet activity     Assist      Assist Level: Moderate Assistance - Patient 50 - 74%   Blood pressure (!) 104/56, pulse 78, temperature 98 F (36.7 C), temperature source Oral, resp. rate 18, height '5\' 7"'$  (1.702 m), weight 70.9 kg, SpO2 93 %.  Medical Problem List and Plan: 1. Functional deficits secondary to Left total hip replacement anterior approach 97/98/9211 complicated by acute metabolic encephalopathy.  Weightbearing as tolerated..             -patient may shower             -  ELOS/Goals: 7-9 days S- team conf in am  CIR PT, OT, SLP 2.  Antithrombotics: -DVT/anticoagulation:  Pharmaceutical: Eliquis             -antiplatelet therapy: N/A 3. Postoperative pain: continue Robaxin and oxycodone as needed 4. Mood/Behavior/Sleep: Provide emotional support             -antipsychotic agents: N/A 5. Neuropsych/cognition: This patient is capable of making decisions on his own behalf. 6.  Left heel pressure ulcer.  Continue Prevalon boot.  Paint left heel wound with Betadine daily, allowed to air dry.  Routine skin checks 7. Fluids/Electrolytes/Nutrition: Routine in and outs with follow-up chemistries 8.  Acute blood loss anemia with history of essential thrombocytopenia/coagulopathy/leukocytosis.  Maintained on Hydrea. follow-up hematology services.    Latest Ref Rng & Units 11/20/2021    6:08 AM 11/19/2021    4:58 AM 11/18/2021    5:13 AM  CBC  WBC 4.0 - 10.5 K/uL 31.2  32.2  26.3   Hemoglobin 13.0 - 17.0 g/dL 9.6  10.2  10.2   Hematocrit 39.0 - 52.0 % 30.1  32.6  32.1   Platelets 150 - 400 K/uL 174  185  197     -labs appear stable 9.  Aortic stenosis with aortic valve replacement/CABG 01/03/2018.  Continue home Eliquis 10.  Atrial fibrillation.  Cardiac rate controlled.  Continue Lopressor 25 mg twice daily.  Continue Eliquis Vitals:   11/24/21 1934 11/25/21 0501  BP: 122/79 (!) 104/56  Pulse: 77 78  Resp: 20 18   Temp: 97.9 F (36.6 C) 98 F (36.7 C)  SpO2: 92% 93%  Was on prn lasix at home for leg swelling along with KCL, BPs are soft   11.  Dural venous sinus thrombosis.  Confirmed by CT venogram 10/23.  Patient has been transitioned back to Eliquis 12.  Acute metabolic encephalopathy.  EEG negative.  Ammonia levels within normal limits.  CT MRI unremarkable. 13.  Acute on chronic systolic congestive heart failure.  Monitor for any signs of fluid overload Filed Weights   11/23/21 0307 11/24/21 0323 11/25/21 0501  Weight: 72.1 kg 71.3 kg 70.9 kg   Pretib edema , check daily weights, will give lasix '40mg'$  po x 1  11/7 weight are decreasing , mobilizing fluid no diuretics on board since one time lasix dose  14.  BPH/urinary retention.  Flomax has been resumed 0.4 mg daily.  Foley catheter tube removed 11/11/2021 patient noted to have failed voiding trial Foley tube reinserted 11/12/2021 and would remain in place.DO NOT REMOVE FOLEY.  Plan follow-up outpatient with Dr. Louis Meckel urology services 15.  Hyperlipidemia.  Crestor 16.  GERD.  Protonix  17.  Mild cellulitis L heel- Keflex completed   LOS: 6 days A FACE TO FACE EVALUATION WAS PERFORMED  Charlett Blake 11/25/2021, 7:54 AM

## 2021-11-25 NOTE — Progress Notes (Signed)
Physical Therapy Session Note  Patient Details  Name: Justin [name] MRN: [id number] Date of Birth: 1955-02-22  Today's Date: 11/25/2021 PT Individual Time: 0922-0936 PT Individual Time Calculation (min): 14 min  and Today's Date: 11/25/2021 PT Missed Time: 31 Minutes Missed Time Reason: Patient fatigue;Nursing care  Short Term Goals: Week 1:  PT Short Term Goal 1 (Week 1): Pt will perfrom transfers with Supervision assist with LRAD (STG=LGT) PT Short Term Goal 2 (Week 1): Pt will ambulate 199f with supervision assist  with LRAD PT Short Term Goal 3 (Week 1): Pt will perfrom car transfer with Min/Mod A with LRAD PT Short Term Goal 4 (Week 1): Pt will perfrom chair/bed transfers CGA with LRAD PT Short Term Goal 5 (Week 1): PT will perfrom bed mobility with Supervision assist  Skilled Therapeutic Interventions/Progress Updates:  Patient seated in recliner with BLE elevated on entrance to room. Patient initially alert and agreeable to PT session. RN present and providing morning medications.  Discussion with pt re: having slept well, just received pain medications. Discussing pain in "ruptured disc" when pt begins to fall asleep while talking. Pt requests to hold off on morning therapy. Provided pt with  isometric glute squeezes and quad sets to perform when feeling more awake prior to afternoon session. Pt in agreement.   Patient seated in recliner with BLE elevated at end of session with brakes locked, and all needs within reach.   Therapy Documentation Precautions:  Precautions Precautions: None Restrictions Weight Bearing Restrictions: Yes LUE Weight Bearing: Weight bearing as tolerated LLE Weight Bearing: Weight bearing as tolerated Other Position/Activity Restrictions: anterior hip precautions General:   Vital Signs: Therapy Vitals Temp: 98 F (36.7 C) Temp Source: Oral Pulse Rate: 78 Resp: 18 BP: (!) 104/56 Patient Position (if appropriate): Lying Oxygen  Therapy SpO2: 93 % O2 Device: Room Air Pain:  Related at 4/10 at rest.   Therapy/Group: Individual Therapy  JAlger SimonsPT, DPT, CSRS 11/25/2021, 7:44 AM

## 2021-11-25 NOTE — Progress Notes (Signed)
Physical Therapy Session Note  Patient Details  Name: Justin [name] MRN: [id number] Date of Birth: 1955/02/25  Today's Date: 11/25/2021 PT Individual Time: 1403-1505 PT Individual Time Calculation (min): 62 min   Short Term Goals: Week 1:  PT Short Term Goal 1 (Week 1): Pt will perfrom transfers with Supervision assist with LRAD (STG=LGT) PT Short Term Goal 2 (Week 1): Pt will ambulate 191f with supervision assist  with LRAD PT Short Term Goal 3 (Week 1): Pt will perfrom car transfer with Min/Mod A with LRAD PT Short Term Goal 4 (Week 1): Pt will perfrom chair/bed transfers CGA with LRAD PT Short Term Goal 5 (Week 1): PT will perfrom bed mobility with Supervision assist Week 2:     Skilled Therapeutic Interventions/Progress Updates:  Patient seated upright in recliner on entrance to room. Patient alert and agreeable to PT session.   Patient with 5/10 pain complaint at start of session. Related that this is at ruptured disc but describes this as being in the hip joint. Education provided that discs are in spine and labrum/ cartilage is in joint. But pt doesn't have the same hip pain that he had prior to surgery. He now has healing pain that will get better over time.   Therapeutic Activity: Transfers: Pt performed sit<>stand and stand pivot transfers throughout session with close supervision. Provided verbal cues for more forward lean and posterior RLE positioning to stand and increased use of BLE to control lowering to seat.  Gait Training:  Pt ambulated 60 ft using RW with close supervision. No cueing provided.  Neuromuscular Re-ed: NMR facilitated during session with focus on standing balance, safety awareness, problem solving. Pt guided in bag toss to target to assess proactive/ reactive balance. No LOB throughout. After final throwing bout, pt provided with reacher and ambulates over to target board to retrieve bags and place in basket held at differing heights/ distances from  pt. Good use of reacher with no LOB. NMR performed for improvements in motor control and coordination, balance, sequencing, judgement, and self confidence/ efficacy in performing all aspects of mobility at highest level of independence.   Bed Mobility: Pt performed sit--> supine at end of session requiring CGA/ MinA for BLE to bed surface. VC/ tc required for effort. Pt is able to assist with neutral positioning and moving toward HOB. Pillow placed under L lower leg in order to float heel for reduced pressure on wound.  Patient supine in bed at end of session with brakes locked, bed alarm set, and all needs within reach.   Therapy Documentation Precautions:  Precautions Precautions: None Restrictions Weight Bearing Restrictions: Yes LUE Weight Bearing: Weight bearing as tolerated LLE Weight Bearing: Weight bearing as tolerated Other Position/Activity Restrictions: anterior hip precautions General: PT Amount of Missed Time (min): 31 Minutes PT Missed Treatment Reason: Patient fatigue;Nursing care Vital Signs:  Pain: Pain Assessment Pain Scale: 0-10 Pain Score: 0-No pain  Therapy/Group: Individual Therapy  JAlger SimonsPT, DPT, CSRS 11/25/2021, 10:32 AM

## 2021-11-26 MED ORDER — FUROSEMIDE 20 MG PO TABS
10.0000 mg | ORAL_TABLET | Freq: Every day | ORAL | Status: DC
  Administered 2021-11-26 – 2021-11-30 (×5): 10 mg via ORAL
  Filled 2021-11-26 (×5): qty 1

## 2021-11-26 MED ORDER — POTASSIUM CHLORIDE CRYS ER 10 MEQ PO TBCR
10.0000 meq | EXTENDED_RELEASE_TABLET | Freq: Every day | ORAL | Status: DC
  Administered 2021-11-26 – 2021-11-30 (×5): 10 meq via ORAL
  Filled 2021-11-26 (×5): qty 1

## 2021-11-26 MED ORDER — MAGNESIUM OXIDE -MG SUPPLEMENT 400 (240 MG) MG PO TABS
400.0000 mg | ORAL_TABLET | Freq: Every day | ORAL | Status: DC
  Administered 2021-11-26 – 2021-11-30 (×5): 400 mg via ORAL
  Filled 2021-11-26 (×5): qty 1

## 2021-11-26 NOTE — Progress Notes (Signed)
Physical Therapy Session Note  Patient Details  Name: Justin [name] MRN: [id number] Date of Birth: [birth year]  Today's Date: 11/26/2021 PT Individual Time: 0800 - 0902  PT Individual Time Calculation (min): 62 min  Short Term Goals: Week 1:  PT Short Term Goal 1 (Week 1): Pt will perfrom transfers with Supervision assist with LRAD (STG=LGT) PT Short Term Goal 2 (Week 1): Pt will ambulate 126f with supervision assist  with LRAD PT Short Term Goal 3 (Week 1): Pt will perfrom car transfer with Min/Mod A with LRAD PT Short Term Goal 4 (Week 1): Pt will perfrom chair/bed transfers CGA with LRAD PT Short Term Goal 5 (Week 1): PT will perfrom bed mobility with Supervision assist  Skilled Therapeutic Interventions/Progress Updates:  Patient seated upright at EOB on entrance to room. Pt alert and agreeable to Pt session. Pt reports slight discomfort on left heal with a 2/10 pain complaint at start of session. Foam dressing present upon inspection. Noticed increased swelling in left lower extremity.  Provided ted hose on right foot, included ted hose on left foot on top of foam dressing per physician request. Nurse present at start of session to administer medication.  Therapeutic Activity: Transfers: Pt performed sit<> stand, stand pivot transfers, and ambulatory transfers using RW with close supervision. Provided verbal cues for forward lean and maintaining upright posture  LE strengthening: Pt performed sit<> stand (2 x 8), isometric straight leg extensions (1 x 10 w/ 3 sec hold), and seated isometric glute abduction with manual resistance at EOB (2 x 8 w/ 3 x sec hold). Pt provided with verbal and tactile cuing for glute activation and maintaining muscle contractions. Pt also perfromed seated leg extensions (2x8) w/ 2Ib BLE ankle weights and hamstirng curlsl with manual resistance (2 x 8) on wheelchair towards the end of session due to decrease in standing tolerance.  Wheelchair: Patient  demonstrated ability to self propel wheelchair for multiple 30 ft distances. Pt. showed difficulty maintaining  propulsion in straight direction and required close supervision.    Neuromuscular Re-ed NMR was performed  to improve motor control, balance, muscular endurance, coordination, sequencing, judgement and self confidence in all aspects of mobility at the highest level.  Pt performed toe taps (1 x 10) and step ups 6 in stair case using both hand rails w/ close supervision/ CGA. Provided verbal and tactile cuing for foot position and knee elevation. Pt performed one attempt of stair ambulation using both hand rails, with ascending up 6 in steps and descending down 4 in steps. Pt required CGA in both directions and short rest break at the top of staircase before descent. Provided verbal and tactile cuing for foot placement with occasional cuing for Pt to lead with right leg during stair ascent and left leg during descent.  Pt performed standing ball toss w/ catch, 1x10 one direction, 1x10 w/ direction change, and 1x10 w/ bounce pass, using RW with close supervision. Provided verbal cuing for maintaining upright posture with decrease of backward lean.  Pt continues to fatigue quickly and requires frequent rest breaks throughout treatment session. Pt mentions increase discomfort in right heel towards end of session with a pain complaint of (6/10) and requested to discontinue all gait and weight bearing activities.  Pt seated upright in wheelchair at end of session with brakes locked, bell in reach and all needs met.      Therapy Documentation Precautions:  Precautions Precautions: None Restrictions Weight Bearing Restrictions: Yes LUE Weight Bearing: Weight bearing as  tolerated LLE Weight Bearing: Weight bearing as tolerated Other Position/Activity Restrictions: anterior hip precautions    Pain: Pain Assessment Pain Scale: 0-10 Pain Score: 2  Faces Pain Scale: No hurt Pain Type:  Chronic pain Pain Location: Foot Pain Orientation: Left Pain Onset: Gradual Patients Stated Pain Goal: 2 Pain Intervention(s): Medication (See eMAR) Multiple Pain Sites: No  Therapy/Group: Individual Therapy  Hilary Hertz PT, SPT   Hilary Hertz 11/26/2021, 9:41 AM

## 2021-11-26 NOTE — Progress Notes (Signed)
Occupational Therapy Session Note  Patient Details  Name: Zayaan Kozak MRN: [id number] Date of Birth: 1955-12-12  Today's Date: 11/26/2021 OT Individual Time: 2707-8675 OT Individual Time Calculation (min): 35 min    Short Term Goals: Week 1:  OT Short Term Goal 1 (Week 1): STGs = LTGs  Skilled Therapeutic Interventions/Progress Updates:  Pt greeted seated EOB, reporting fatigue and requesting to work on therex from EOB, pt completed below therex to facilitate improved LB strength/endurance:  3X20 BLEs LAQS with 1.5 ankle weight from EOB, attempted to grade therex up by having pt complete standing or new exercises with pt declining.   Education provided on topic of energy conservation for home with tips and strategies provided for managing fatigue at home, pt very receptive to education and even able to recall some of the strategies mentioned in yesterdays session. Issued pt handout to increase carryover. Pt left seated EOB with all needs within reach and bed alarm activated.  Therapy Documentation Precautions:  Precautions Precautions: None Restrictions Weight Bearing Restrictions: Yes LUE Weight Bearing: Weight bearing as tolerated LLE Weight Bearing: Weight bearing as tolerated Other Position/Activity Restrictions: anterior hip precautions  Pain: Unrated pain in L heel, rest breaks provided      Therapy/Group: Individual Therapy  Corinne Ports [facility] 11/26/2021, 4:09 PM

## 2021-11-26 NOTE — Progress Notes (Signed)
Patient ID: Justin [name], male   DOB: 04/12/1955, 66 y.o.   MRN: [id number]  Rolling walker and transport chair ordered through [employer].

## 2021-11-26 NOTE — Progress Notes (Signed)
PROGRESS NOTE   Subjective/Complaints:  No issues except chronic low back pain   ROS: neg CP, SOB, N/V/D    Objective:   No results found. No results for input(s): "WBC", "HGB", "HCT", "PLT" in the last 72 hours.  No results for input(s): "NA", "K", "CL", "CO2", "GLUCOSE", "BUN", "CREATININE", "CALCIUM" in the last 72 hours.   Intake/Output Summary (Last 24 hours) at 11/26/2021 0734 Last data filed at 11/25/2021 2320 Gross per 24 hour  Intake 692 ml  Output 1000 ml  Net -308 ml      Pressure Injury 11/17/21 Heel Left Unstageable - Full thickness tissue loss in which the base of the injury is covered by slough (yellow, tan, gray, green or brown) and/or eschar (tan, brown or black) in the wound bed. pressure injury (Active)  11/17/21 0612  Location: Heel  Location Orientation: Left  Staging: Unstageable - Full thickness tissue loss in which the base of the injury is covered by slough (yellow, tan, gray, green or brown) and/or eschar (tan, brown or black) in the wound bed.  Wound Description (Comments): pressure injury  Present on Admission: Yes (present on transfer to 4E/Urology)     Pressure Injury 11/19/21 Coccyx Mid;Lower Stage 2 -  Partial thickness loss of dermis presenting as a shallow open injury with a red, pink wound bed without slough. (Active)  11/19/21 1625  Location: Coccyx  Location Orientation: Mid;Lower  Staging: Stage 2 -  Partial thickness loss of dermis presenting as a shallow open injury with a red, pink wound bed without slough.  Wound Description (Comments):   Present on Admission: Yes     Pressure Injury 11/19/21 Sacrum Left Stage 2 -  Partial thickness loss of dermis presenting as a shallow open injury with a red, pink wound bed without slough. (Active)  11/19/21 1625  Location: Sacrum  Location Orientation: Left  Staging: Stage 2 -  Partial thickness loss of dermis presenting as a shallow  open injury with a red, pink wound bed without slough.  Wound Description (Comments):   Present on Admission: Yes     Pressure Injury 11/19/21 Sacrum Right Stage 2 -  Partial thickness loss of dermis presenting as a shallow open injury with a red, pink wound bed without slough. (Active)  11/19/21 1625  Location: Sacrum  Location Orientation: Right  Staging: Stage 2 -  Partial thickness loss of dermis presenting as a shallow open injury with a red, pink wound bed without slough.  Wound Description (Comments):   Present on Admission: Yes    Physical Exam: Vital Signs Blood pressure 124/65, pulse 79, temperature 97.9 F (36.6 C), temperature source Oral, resp. rate 16, height _0  (1.702 m), weight 72.1 kg, SpO2 96 %.    General: No acute distress Mood and affect are appropriate Heart: Regular rate and rhythm no rubs murmurs or extra sounds Lungs: Clear to auscultation, breathing unlabored, no rales or wheezes Abdomen: Positive bowel sounds, soft nontender to palpation, nondistended Extremities: No clubbing, cyanosis, or edema   Skin: sacral, coccyx, heel issues as above Neurologic: A & O x 3 , motor strength is 5/5 in bilateral deltoid, bicep, tricep, grip, 5/5 Right and  4/5 Left hip flexor, knee extensors,5/5 Bilateral ankle dorsiflexor and plantar flexor,    Musculoskeletal: Full range of motion in all 4 extremities. No joint swelling   Assessment/Plan: 1. Functional deficits which require 3+ hours per day of interdisciplinary therapy in a comprehensive inpatient rehab setting. Physiatrist is providing close team supervision and 24 hour management of active medical problems listed below. Physiatrist and rehab team continue to assess barriers to discharge/monitor patient progress toward functional and medical goals  Care Tool:  Bathing    Body parts bathed by patient: Right arm, Left arm, Chest, Abdomen, Front perineal area, Buttocks, Right upper leg, Left upper leg, Face,  Right lower leg, Left lower leg   Body parts bathed by helper: Right lower leg, Left lower leg     Bathing assist Assist Level: Set up assist     Upper Body Dressing/Undressing Upper body dressing   What is the patient wearing?: Pull over shirt    Upper body assist Assist Level: Set up assist    Lower Body Dressing/Undressing Lower body dressing      What is the patient wearing?: Pants     Lower body assist Assist for lower body dressing: Set up assist     Toileting Toileting    Toileting assist Assist for toileting: Minimal Assistance - Patient > 75%     Transfers Chair/bed transfer  Transfers assist     Chair/bed transfer assist level: Contact Guard/Touching assist     Locomotion Ambulation   Ambulation assist      Assist level: Minimal Assistance - Patient > 75% Assistive device: Walker-rolling Max distance: 55   Walk 10 feet activity   Assist     Assist level: Minimal Assistance - Patient > 75% Assistive device: Walker-rolling   Walk 50 feet activity   Assist    Assist level: Minimal Assistance - Patient > 75% Assistive device: Walker-rolling    Walk 150 feet activity   Assist Walk 150 feet activity did not occur: Safety/medical concerns         Walk 10 feet on uneven surface  activity   Assist Walk 10 feet on uneven surfaces activity did not occur: Safety/medical concerns         Wheelchair     Assist Is the patient using a wheelchair?: Yes Type of Wheelchair: Manual    Wheelchair assist level: Supervision/Verbal cueing Max wheelchair distance: 100    Wheelchair 50 feet with 2 turns activity    Assist        Assist Level: Supervision/Verbal cueing   Wheelchair 150 feet activity     Assist      Assist Level: Moderate Assistance - Patient 50 - 74%   Blood pressure 124/65, pulse 79, temperature 97.9 F (36.6 C), temperature source Oral, resp. rate 16, height _0  (1.702 m), weight 72.1 kg,  SpO2 96 %.  Medical Problem List and Plan: 1. Functional deficits secondary to Left total hip replacement anterior approach 42/70/6237 complicated by acute metabolic encephalopathy.  Weightbearing as tolerated..             -patient may shower             -ELOS/Goals: 7-9 days S-Team conference today please see physician documentation under team conference tab, met with team  to discuss problems,progress, and goals. Formulized individual treatment plan based on medical history, underlying problem and comorbidities.  CIR PT, OT, SLP 2.  Antithrombotics: -DVT/anticoagulation:  Pharmaceutical: Eliquis             -  antiplatelet therapy: N/A 3. Postoperative pain: continue Robaxin and oxycodone as needed 4. Mood/Behavior/Sleep: Provide emotional support             -antipsychotic agents: N/A 5. Neuropsych/cognition: This patient is capable of making decisions on his own behalf. 6.  Left heel pressure ulcer.  Continue Prevalon boot.  Paint left heel wound with Betadine daily, allowed to air dry.  Routine skin checks 7. Fluids/Electrolytes/Nutrition: Routine in and outs with follow-up chemistries 8.  Acute blood loss anemia with history of essential thrombocytopenia/coagulopathy/leukocytosis.  Maintained on Hydrea. follow-up hematology services.    Latest Ref Rng & Units 11/20/2021    6:08 AM 11/19/2021    4:58 AM 11/18/2021    5:13 AM  CBC  WBC 4.0 - 10.5 K/uL 31.2  32.2  26.3   Hemoglobin 13.0 - 17.0 g/dL 9.6  10.2  10.2   Hematocrit 39.0 - 52.0 % 30.1  32.6  32.1   Platelets 150 - 400 K/uL 174  185  197     -labs appear stable 9.  Aortic stenosis with aortic valve replacement/CABG 01/03/2018.  Continue home Eliquis 10.  Atrial fibrillation.  Cardiac rate controlled.  Continue Lopressor 25 mg twice daily.  Continue Eliquis Vitals:   11/25/21 1921 11/26/21 0333  BP: 108/61 124/65  Pulse: 72 79  Resp: 15 16  Temp: 97.8 F (36.6 C) 97.9 F (36.6 C)  SpO2: 93% 96%  Was on prn lasix at  home for leg swelling along with KCL, BPs are soft   11.  Dural venous sinus thrombosis.  Confirmed by CT venogram 10/23.  Patient has been transitioned back to Eliquis 12.  Acute metabolic encephalopathy.  EEG negative.  Ammonia levels within normal limits.  CT MRI unremarkable. 13.  Acute on chronic systolic congestive heart failure.  Monitor for any signs of fluid overload Filed Weights   11/24/21 0323 11/25/21 0501 11/26/21 0500  Weight: 71.3 kg 70.9 kg 72.1 kg   Pretib edema ,restart low dose lasix , check BMET 11/7 weight are decreasing , mobilizing fluid no diuretics on board since one time lasix dose  14.  BPH/urinary retention.  Flomax has been resumed 0.4 mg daily.  Foley catheter tube removed 11/11/2021 patient noted to have failed voiding trial Foley tube reinserted 11/12/2021 and would remain in place.DO NOT REMOVE FOLEY.  Plan follow-up outpatient with Dr. Louis Meckel urology services 15.  Hyperlipidemia.  Crestor 16.  GERD.  Protonix  17.  Mild cellulitis L heel- Keflex completed   LOS: 7 days A FACE TO FACE EVALUATION WAS PERFORMED  Charlett Blake 11/26/2021, 7:34 AM

## 2021-11-26 NOTE — Plan of Care (Signed)
  Problem: RH Bathing Goal: LTG Patient will bathe all body parts with assist levels (OT) Description: LTG: Patient will bathe all body parts with assist levels (OT) Flowsheets (Taken 11/26/2021 1100) LTG: Pt will perform bathing with assistance level/cueing: (LTG downgraded due to pt's low endurance.) Supervision/Verbal cueing Note: LTG downgraded due to pt's low endurance.    Problem: RH Dressing Goal: LTG Patient will perform lower body dressing w/assist (OT) Description: LTG: Patient will perform lower body dressing with assist, with/without cues in positioning using equipment (OT) Flowsheets (Taken 11/26/2021 1057) LTG: Pt will perform lower body dressing with assistance level of: (LTG downgraded as pt needs A with pants and management of catheter bag in pants along with A for socks due to hip replacement.  Pt has wounds on heel so sock aide not appropriate.) Minimal Assistance - Patient > 75% Note: LTG downgraded as pt needs A with pants and management of catheter bag in pants along with A for socks due to hip replacement. Pt has wounds on heel so sock aide not appropriate.

## 2021-11-26 NOTE — Plan of Care (Signed)
  Problem: RH Ambulation Goal: LTG Patient will ambulate in controlled environment (PT) Description: LTG: Patient will ambulate in a controlled environment, # of feet with assistance (PT). Flowsheets Taken 11/26/2021 0754 by Lorie Phenix, PT LTG: Ambulation distance in controlled environment: 147f Taken 11/20/2021 1327 by GHilary Hertz Student-PT LTG: Pt will ambulate in controlled environ  assist needed:: Independent with assistive device Goal: LTG Patient will ambulate in community environment (PT) Description: LTG: Patient will ambulate in community environment, # of feet with assistance (PT). Flowsheets Taken 11/20/2021 1445 by TLorie Phenix PT LTG: Ambulation distance in community environment: 1058fwith LRAD Taken 11/20/2021 1327 by GiHilary HertzStudent-PT LTG: Pt will ambulate in community environ  assist needed:: Supervision/Verbal cueing

## 2021-11-26 NOTE — Progress Notes (Signed)
Patient ID: Justin [name], male   DOB: 03/28/1955, 66 y.o.   MRN: [id number]  Team Conference Report to Patient/Family  Team Conference discussion was reviewed with the patient and caregiver, including goals, any changes in plan of care and target discharge date.  Patient and caregiver express understanding and are in agreement.  The patient has a target discharge date of 11/29/21.  SW met with patient and provided team conference updates. Patient pleased with his discharge on Saturday. Patient will require HH. RW and a wheelchair. No additional questions or concerns.  Dyanne Iha 11/26/2021, 1:35 PM

## 2021-11-26 NOTE — Patient Care Conference (Signed)
Inpatient RehabilitationTeam Conference and Plan of Care Update Date: 11/26/2021   Time: 10:15 AM    Patient Name: Justin [name]      Medical Record Number: [id number]  Date of Birth: 07-15-1955 Sex: Male         Room/Bed: 4M09C/4M09C-01 Payor Info: Payor: [place] / Plan: [employer] MEDICARE / Product Type: *No Product type* /    Admit Date/Time:  11/19/2021  3:08 PM  Primary Diagnosis:  Status post total hip replacement, left  Hospital Problems: Principal Problem:   Status post total hip replacement, left Active Problems:   Debility   Pressure injury of skin    Expected Discharge Date: Expected Discharge Date: 11/29/21  Team Members Present: Physician leading conference: Dr. Alysia Penna Social Worker Present: Erlene Quan, BSW Nurse Present: Dorien Chihuahua, RN PT Present: Barrie Folk, PT OT Present: Meriel Pica, OT SLP Present: Sherren Kerns, SLP PPS Coordinator present : Gunnar Fusi, SLP     Current Status/Progress Goal Weekly Team Focus  Bowel/Bladder   Patient is continent of bowel; FC for urine collection   continue to be continent of bowel; FC      Foley care/peri care education for home  Swallow/Nutrition/ Hydration               ADL's   set- up UB ADLS, supervision LB dressing, S toileting tasks, CGA for ambulatory ADL transfers with Rw. pt continues to present with decreased activity tolerance.   MOD I   family ed, IADLS, functional mobility, BADL reeducation    Mobility   CGA-Supervision assist with RW for gait and transfers.   Mod I gait and transfers with RW supervision assist stair management  improved independence safety with transfers. family education.    Communication                Safety/Cognition/ Behavioral Observations  mod A   min A for emergent awareness and attention; mod A for memory; sup A for basic problem solving   problem solving, functional recall, sustained attention, and awareness     Pain   Patient currently reports his pain level a five on a scale of 0 - 10; PRN meds given   Patient wil lreport relief of pain; Patient will rate the pain scale lower that the intial rate at a level that is acceptable to them or 0/10      Assess pain q shift and effectiveness of medications  Skin   Wound care to left leg and PRN   Continue to assess and document wounds weekly on Wednesday; PRN     Wound care treatment plan review/revision as needed    Discharge Planning:  Discharging home with spouse unable to provide physican assistance, using RW. (Supervision only). 24/7.   Team Discussion: Patient with anemia addressed; foley remains until OP follow up appointment. Progress limited by back pain and heel pain.  Patient on target to meet rehab goals: yes, currently needs CGA - min assist overall.  Goals for discharge set for supervision overall with mod I transfers.   *See Care Plan and progress notes for long and short-term goals.   Revisions to Treatment Plan:  Downgraded PT distance goals to 100'   Teaching Needs: Safety, medications, dietary modifications, wound care, transfers, toileting, etc   Current Barriers to Discharge: Inaccessible home environment and Lack of/limited family support  Possible Resolutions to Barriers: Family education HH follow up services Non -emergent transport DME: RW, W/C  Medical Summary Current Status: Left hip pain,CHF, acute on Chronic LE edma, urinary retention  Barriers to Discharge: Decreased family/caregiver support   Possible Resolutions to [employer] Focus: wife is s/p MVA with mobility issues pt will need to be modI/Sup level , work on Brewing technologist with SLP   Continued Need for Acute Rehabilitation Level of Care: The patient requires daily medical management by a physician with specialized training in physical medicine and rehabilitation for the following reasons: Direction of a multidisciplinary physical  rehabilitation program to maximize functional independence : Yes Medical management of patient stability for increased activity during participation in an intensive rehabilitation regime.: Yes Analysis of laboratory values and/or radiology reports with any subsequent need for medication adjustment and/or medical intervention. : Yes   I attest that I was present, lead the team conference, and concur with the assessment and plan of the team.   Dorien Chihuahua B 11/26/2021, 2:57 PM

## 2021-11-26 NOTE — Progress Notes (Signed)
Occupational Therapy Session Note  Patient Details  Name: Justin [name] MRN: [id number] Date of Birth: 10/26/1955  Today's Date: 11/26/2021 OT Individual Time: 1030-1115 OT Individual Time Calculation (min): 45 min    Short Term Goals: Week 1:  OT Short Term Goal 1 (Week 1): STGs = LTGs  Skilled Therapeutic Interventions/Progress Updates:    Pt received laying in bed having just finished with nursing who changed his L heel wound. Pt stated he was in a lot of pain in his heel and he could not tolerate doing any standing or walking at this time. He did want to shower but was having too much discomfort. Will try to shower tomorrow.  Placed pillow under L calf to float heel.  Pt agreeable to working on UE exercises and endurance from bed level. Moved bed flat and pt scooted up in bed using R foot and arms with rails.  From flat supine. Pt worked on arm punches to ceiling with red theraband wrapped around his hand and the other end attached to bed rail 15x and then 15 tricep extensions.  Repeated with each arm and then completed second set. Pt stated these exercises were very fatiguing for him but he was able to get through them.  Pt then was positioned in bed in chair position.  Pt worked on resisted rows with 3 lb dowel bar and red theraband for upper back strength. Pt again felt very fatigued. At end of session, lowered HOB so pt could nap and rest as he said he had very little sleep the night before.  Bed alarm set and all needs met.     Therapy Documentation Precautions:  Precautions Precautions: None Restrictions Weight Bearing Restrictions: Yes LUE Weight Bearing: Weight bearing as tolerated LLE Weight Bearing: Weight bearing as tolerated Other Position/Activity Restrictions: anterior hip precautions   Pain: Pain Assessment Pain Scale: 0-10 Pain Score: 2  Faces Pain Scale: No hurt Pain Type: Chronic pain Pain Location: Foot Pain Orientation: Left Pain Onset:  Gradual Patients Stated Pain Goal: 2 Pain Intervention(s): Medication (See eMAR) Multiple Pain Sites: No ADL: ADL Eating: Independent Grooming: Setup Upper Body Bathing: Setup Where Assessed-Upper Body Bathing: Shower Lower Body Bathing: Supervision/safety Where Assessed-Lower Body Bathing: Shower Upper Body Dressing: Setup Lower Body Dressing:  (min for socks) Toileting: Contact guard Where Assessed-Toileting: Toilet Toilet Transfer: Contact guard Toilet Transfer Equipment: Raised toilet seat Walk-In Shower Transfer: Contact guard Walk-In Shower Transfer Method: Ambulating Walk-In Shower Equipment: Grab bars, Shower seat with back   Therapy/Group: Individual Therapy  , 11/26/2021, 11:48 AM 

## 2021-11-26 NOTE — Progress Notes (Addendum)
Patient ID: Justin [name], male   DOB: [birth year], 67 y.o.   MRN: [id number]  [facility] referral sent to [facility] Patient approved by Caryl Pina. Orders emailed

## 2021-11-26 NOTE — Discharge Summary (Signed)
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 retroperitoneal hematoma and possible 7 mm liver laceration hyperlipidemia right total hip replacement 12/23/2018.  Per chart review lives with spouse independent prior to admission.  Presented 11/04/2021 with progressive left hip pain secondary to osteoarthritis and no relief with conservative care.  Underwent left total hip replacement anterior approach 11/04/2021 per Dr. Paralee Cancel.  Hospital course follow-up oncology service Dr. Marin Olp for essential thrombocytopenia.  Acute blood loss anemia 6.3-7.2 patient  transfused 4 units total packed red blood cells.  Chronic Eliquis initially held due to concern for bleeding.  CT of the abdomen pelvis showed no retroperitoneal hemorrhage.  INR elevated 1.8 patient did receive administration of 5 mg of vitamin K 10/21 and additional 5 mg 10/22 gastroenterology services consulted not entirely convinced the patient had cirrhosis and continue to monitor coagulopathy.  Patient bouts of confusion postoperatively.  EEG negative.  Ammonia levels normal.  CT/MRI showed no acute changes however did suggest sinus venous thrombosis confirmed by CT venogram.  Hospital  course complicated by worsening leukocytosis 70,700 as well as noted encephalopathy concerning for underlying infectious process infectious disease consulted after initial empiric antibiotic therapy.  Patient had no fever or other exam findings to suggest infectious process after several days of empiric antibiotics were discontinued.  Blood cultures urine cultures negative.  He was placed on Keflex 11/19/2021 for mild cellulitis left heel.  In regards to patient's sinus venous thrombosis initially placed on heparin and transition back to Eliquis.  Echocardiogram with ejection fraction of 45 to 50% initial AKI creatinine 1.72 responding to gentle IV fluids latest creatinine 1.25  Wound care nurse consulted 11/17/2021 for left heel ulcer with Prevalon boot placed and wound care as directed.  Patient with history of BPH failed voiding trial Foley tube placed remain in place in the follow-up outpatient urology services.  Therapy evaluations completed due to patient decreased functional mobility was admitted for a comprehensive rehab program.   Hospital Course: Jacorian Golaszewski was admitted to rehab 11/19/2021 for inpatient therapies to consist of PT, ST and OT at least three hours five days a week. Past admission physiatrist, therapy team and rehab RN have worked together to provide customized collaborative inpatient rehab.  Pertaining to patient's left total hip replacement anterior approach 93/81/8299 complicated by acute metabolic encephalopathy.  Weightbearing as tolerated.  Surgical site healing nicely weightbearing as tolerated follow-up outpatient orthopedic service.  Chronic Eliquis resumed for history of atrial fibrillation aortic stenosis with aortic valve replacement no bleeding episodes.  Left heel pressure ulcer Prevalon boot with wound pain and daily Betadine routine skin care.  Patient did complete a course of Keflex for wound care coverage.  Acute blood loss anemia essential thrombocytopenia  coagulopathy maintained on Hydrea follow-up outpatient hematology services.  Hospital course dural venous sinus thrombosis confirmed by CT venogram 10/23 patient transition back to Eliquis.  Acute metabolic encephalopathy EEG negative ammonia level within normal limits CT MRI unremarkable mental status continued to improve.  Acute on chronic systolic congestive heart failure exhibiting no signs of fluid overload.  BPH urinary retention Flomax resumed Foley catheter tube placed to be remain in place to follow-up outpatient urology services Dr. Louis Meckel.  Crestor ongoing for hyperlipidemia.  ENT Dr. Constance Holster consulted 11/29/2021 for epistaxis with noted history of thrombocytopenia.  A Rhino Rocket had initially been inserted.  Underwent flexible nasal endoscopy sprayed with Afrin Xylocaine spray.  There was a bleeding site identified along the anterior superior attachment of the left middle turbinate that was cauterized with silver nitrate.   Blood pressures were monitored on TID basis and controlled monitored  Diabetes has been monitored with ac/hs CBG checks and SSI was use prn for tighter BS control.    Rehab course: During patient's stay in rehab weekly team conferences were held to monitor patient's progress, set goals and discuss barriers to discharge. At admission, patient required moderate assist 50 feet rolling walker moderate assist sit to stand  Physical exam.  Blood pressure 110/70 pulse 80 respirations 18 oxygen saturation 92% room air Constitutional.  No acute distress HEENT Head.  Normocephalic and atraumatic Eyes.  Pupils round and reactive to light no discharge without nystagmus Neck.  Supple nontender no JVD without thyromegaly Cardiac regular rate and rhythm without any extra sounds or murmur heard Abdomen.  Soft nontender positive bowel sounds without rebound Respiratory effort normal no respiratory distress without wheeze Skin.  Left heel full-thickness tissue loss  bruising/irritation Neurologic.  Alert makes eye contact with examiner follows commands.  Provides name and age but limited medical historian.  No focal deficits but left lower extremity tested limited  He/She  has had improvement in activity tolerance, balance, postural control as well as ability to compensate for deficits. He/She has had improvement in functional use RUE/LUE  and RLE/LLE as well as improvement in awareness.  Perform sit to stand and stand pivot transfers throughout session with close supervision.  Ambulates rolling walker close supervision.  Bed mobility performed sit to supine contact-guard supervision.  Completed upper body dressing from edge of bed with set up.  Minimal assist lower body dressing.  SLP completed medication management to increase awareness of current medication regimen.  SLP facilitated session by providing overall supervision assist.  Patient was able to recall function and frequency of currently scheduled medications when named with supervision.  Full family teaching completed plan discharge to home       Disposition: Discharge disposition: 01-Home or Self Care     Discharge to home   Diet: Regular  Special Instructions: Weightbearing as tolerated.  Prevalon boot left lower extremity  Paint left heel wound with Betadine daily allowed to air dry  Follow-up outpatient urology service Dr. Louis Meckel for voiding trial  Medications at discharge. 1.  Tylenol as needed 2.  Eliquis 5 mg p.o. twice daily 3.  Lasix 10 mg p.o. daily 4.  Hydrea 500 mg p.o. twice daily 5.  Claritin 10 mg p.o. daily 6.  Magnesium oxide 400 mg p.o. daily 7.  Robaxin 500 mg every 6 hours as needed muscle spasms 8.  Lopressor 25 mg p.o. twice daily 9.  Multivitamin daily 10.  Nitroglycerin as needed chest pain 11.  Oxycodone 5 mg every 4 hours as needed pain 12.  Protonix 40 mg p.o.  daily 13.  MiraLAX daily hold for loose stools 14.  Klor-Con 10 mEq p.o. daily 15.  Crestor  20 mg p.o. daily 16.  Flomax 0.4 mg p.o. daily 17.Cholecalciferol 5000 units daily 18.  Ferrous sulfate 325 mg twice daily 19.  Omega-3 1 capsule daily 20.  Carafate 1 g twice daily  30-35 minutes were spent completing discharge summary and discharge planning     Follow-up Information     Kirsteins, Luanna Salk, MD Follow up.   Specialty: Physical Medicine and Rehabilitation Why: No formal follow-up needed Contact information: [place] [address] [phone]         Paralee Cancel, MD Follow up.   Specialty: Orthopedic Surgery Why: Call for appointment Contact information: [address] [place] [address] [phone]         Volanda Napoleon, MD Follow up.   Specialty: Oncology Why: Call for appointment Contact information: [address] [place] [address] [phone]         Gerrit Heck V, DO Follow up.   Specialty: Gastroenterology Why: Call for appointment as needed Contact information: [place]  [place] [address] [phone]         Ardis Hughs, MD Follow up.   Specialty: Urology Why: Call for appointment in regards to urinary retention Contact information: [place] [address] [phone]                 Signed: Lavon Paganini [place] 11/28/2021, 5:40 AM

## 2021-11-26 NOTE — Progress Notes (Signed)
Speech Language Pathology Daily Session Note  Patient Details  Name: Justin [name] MRN: [id number] Date of Birth: 1955-08-03  Today's Date: 11/26/2021 SLP Individual Time: 0900-1000 SLP Individual Time Calculation (min): 60 min  Short Term Goals: Week 1: SLP Short Term Goal 1 (Week 1): STG=LTG due to short ELOS  Skilled Therapeutic Interventions: Skilled ST treatment focused on cognitive goals. SLP and pt completing medication management task to increase awareness of current medication regime. SLP facilitated session by providing overall sup A verbal cues for identifying pertinent medication information on individualized medication chart which contained the following information: name of medication, dose, instructions, and purpose for taking. Pt was able to recall function and frequency of currently scheduled medications when named with sup-to-min A verbal cues. Throughout session, pt was observed "losing train of thought" intermittently and tangential at times. Pt was oriented to all concepts independently with the exception of day of the week, however pt compensated with mod I with use of dry erase board and context cues. Patient was left in wheelchair with alarm activated and immediate needs within reach at end of session. Continue per current plan of care.      Pain  None/denied  Therapy/Group: Individual Therapy  Patty Sermons 11/26/2021, 9:25 AM

## 2021-11-27 DIAGNOSIS — I482 Chronic atrial fibrillation, unspecified: Secondary | ICD-10-CM

## 2021-11-27 DIAGNOSIS — I5041 Acute combined systolic (congestive) and diastolic (congestive) heart failure: Secondary | ICD-10-CM

## 2021-11-27 DIAGNOSIS — E440 Moderate protein-calorie malnutrition: Secondary | ICD-10-CM

## 2021-11-27 NOTE — Progress Notes (Signed)
Physical Therapy Session Note  Patient Details  Name: Justin [name] MRN: [id number] Date of Birth: Oct 11, 1955  Today's Date: 11/27/2021 PT Individual Time: 4709-2957   47 min   Short Term Goals: Week 1:  PT Short Term Goal 1 (Week 1): Pt will perfrom transfers with Supervision assist with LRAD (STG=LGT) PT Short Term Goal 2 (Week 1): Pt will ambulate 128f with supervision assist  with LRAD PT Short Term Goal 3 (Week 1): Pt will perfrom car transfer with Min/Mod A with LRAD PT Short Term Goal 4 (Week 1): Pt will perfrom chair/bed transfers CGA with LRAD PT Short Term Goal 5 (Week 1): PT will perfrom bed mobility with Supervision assist  Skilled Therapeutic Interventions/Progress Updates:   Pt received sitting in WC and agreeable to PT. Pt reports feeling lightheaded after nose bleed eariler in the day with increased ankle/heel pain at site of deep tissure injury. Requesting to limit therapy this afternoon. Seated BP assesed by PT 103/65. HR 71. Pt performed stand pivot transfer to bed with CGA with significant antalgia on the L heel. Sit>supine with min assist at the LLE. Pt performed supine strengthening SAQ, hip abduction, SLR, heel slides, each performed 2 x 10. With cues for full ROM. Pt requesting to remain in bed. Pt left supine in bed with cal bell in reach and all needs met.       Therapy Documentation Precautions:  Precautions Precautions: None Restrictions Weight Bearing Restrictions: Yes LUE Weight Bearing: Weight bearing as tolerated LLE Weight Bearing: Weight bearing as tolerated Other Position/Activity Restrictions: anterior hip precautions    Pain: Pain Assessment Pain Scale: 0-10 Pain Score: 5  Pain Location: Heel Pain Orientation: Left Pain Descriptors / Indicators: Burning Pain Onset: Gradual Pain Intervention(s): Medication (See eMAR);RN made aware    Therapy/Group: Individual Therapy  ALorie Phenix11/09/2021, 8:51 AM

## 2021-11-27 NOTE — Progress Notes (Addendum)
Patient ID: Justin [name], male   DOB: [birth year], 66 y.o.   MRN: [id number]  Contact information for [employer] provided to patient to process DME order.   11/10: DME cancelled per patient and spouse request

## 2021-11-27 NOTE — Progress Notes (Signed)
Nutrition Follow-up  DOCUMENTATION CODES:   Non-severe (moderate) malnutrition in context of acute illness/injury  INTERVENTION:   Encourage good PO intake Continue Multivitamin w/ minerals daily  NUTRITION DIAGNOSIS:   Moderate Malnutrition related to acute illness as evidenced by energy intake < 75% for > 7 days, moderate fat depletion, moderate muscle depletion. - Ongoing   GOAL:   Patient will meet greater than or equal to 90% of their needs - Ongoing  MONITOR:   PO intake  REASON FOR ASSESSMENT:   Malnutrition Screening Tool    ASSESSMENT:   66 y.o. male admits to inpatient rehab related to functional deficits secondary to left total hip replacement. PMH includes: anemia, aortic stenosis, arthritis, afib, cancer, CAD, GERD.  Pt resting in bed. Reports that he has been eating ok, appetite has improved some. Thinks that he has been eating 25-30% of his meals. Shares that he is excited to get home and get back to doing things he enjoys. Pt requested a ginger ale, RD provided beverage to pt.   Meal Intake 11/07-11/09: 10-75% (average 51%)  Medications reviewed and include: Lasix, Magnesium Oxide, MVI, Protonix, Miralax, Potassium Chloride, Thiamine Labs reviewed.   Diet Order:   Diet Order             Diet regular Room service appropriate? Yes; Fluid consistency: Thin  Diet effective now                  EDUCATION NEEDS:   No education needs have been identified at this time  Skin:  Skin Integrity Issues:: Stage II Stage II: Mid coccyx; left sacrum Incisions: left hip; mid sacrum  Last BM:  11/8  Height:  Ht Readings from Last 1 Encounters:  11/19/21 '5\' 7"'$  (1.702 m)   Weight:  Wt Readings from Last 1 Encounters:  11/27/21 72.3 kg   Ideal Body Weight:  67.2 kg  BMI:  Body mass index is 24.96 kg/m.  Estimated Nutritional Needs:  Kcal:  1914-7829 kcals Protein:  95-115 gm Fluid:  1890-2270 mL    Hermina Barters RD, LDN Clinical  Dietitian See [facility] for contact information.

## 2021-11-27 NOTE — Progress Notes (Addendum)
PROGRESS NOTE   Subjective/Complaints:  No new complaints or concerns this AM. Later in the day Rhino Rocket was started for nasal bleeding.    ROS: neg CP, SOB, N/V/D , Abdominal pain, HA, Cough   Objective:   No results found. No results for input(s): "WBC", "HGB", "HCT", "PLT" in the last 72 hours.  No results for input(s): "NA", "K", "CL", "CO2", "GLUCOSE", "BUN", "CREATININE", "CALCIUM" in the last 72 hours.   Intake/Output Summary (Last 24 hours) at 11/27/2021 1727 Last data filed at 11/27/2021 1521 Gross per 24 hour  Intake 1100 ml  Output 3150 ml  Net -2050 ml      Pressure Injury 11/17/21 Heel Left Unstageable - Full thickness tissue loss in which the base of the injury is covered by slough (yellow, tan, gray, green or brown) and/or eschar (tan, brown or black) in the wound bed. pressure injury (Active)  11/17/21 0612  Location: Heel  Location Orientation: Left  Staging: Unstageable - Full thickness tissue loss in which the base of the injury is covered by slough (yellow, tan, gray, green or brown) and/or eschar (tan, brown or black) in the wound bed.  Wound Description (Comments): pressure injury  Present on Admission: Yes (present on transfer to 4E/Urology)     Pressure Injury 11/19/21 Coccyx Mid;Lower Stage 2 -  Partial thickness loss of dermis presenting as a shallow open injury with a red, pink wound bed without slough. (Active)  11/19/21 1625  Location: Coccyx  Location Orientation: Mid;Lower  Staging: Stage 2 -  Partial thickness loss of dermis presenting as a shallow open injury with a red, pink wound bed without slough.  Wound Description (Comments):   Present on Admission: Yes     Pressure Injury 11/19/21 Sacrum Left Stage 2 -  Partial thickness loss of dermis presenting as a shallow open injury with a red, pink wound bed without slough. (Active)  11/19/21 1625  Location: Sacrum  Location  Orientation: Left  Staging: Stage 2 -  Partial thickness loss of dermis presenting as a shallow open injury with a red, pink wound bed without slough.  Wound Description (Comments):   Present on Admission: Yes     Pressure Injury 11/19/21 Sacrum Right Stage 2 -  Partial thickness loss of dermis presenting as a shallow open injury with a red, pink wound bed without slough. (Active)  11/19/21 1625  Location: Sacrum  Location Orientation: Right  Staging: Stage 2 -  Partial thickness loss of dermis presenting as a shallow open injury with a red, pink wound bed without slough.  Wound Description (Comments):   Present on Admission: Yes    Physical Exam: Vital Signs Blood pressure 109/60, pulse 60, temperature 98.9 F (37.2 C), temperature source Oral, resp. rate 18, height '5\' 7"'$  (1.702 m), weight 72.3 kg, SpO2 95 %.    General: No acute distress, working with OT Mood and affect are appropriate, pleasant  Heart: Regular rate and rhythm no rubs murmurs or extra sounds Lungs: Clear to auscultation, breathing unlabored, no rales or wheezes, non-labored Abdomen: Positive bowel sounds, soft nontender to palpation, nondistended Extremities: No clubbing, cyanosis, or edema   Skin: sacral, coccyx, heel issues  as above Neurologic: A & O x 3 , motor strength is 5/5 in bilateral deltoid, bicep, tricep, grip, 5/5 Right and 4/5 Left hip flexor, knee extensors,5/5 Bilateral ankle dorsiflexor and plantar flexor,    Musculoskeletal: Full range of motion in all 4 extremities. No joint swelling   Assessment/Plan: 1. Functional deficits which require 3+ hours per day of interdisciplinary therapy in a comprehensive inpatient rehab setting. Physiatrist is providing close team supervision and 24 hour management of active medical problems listed below. Physiatrist and rehab team continue to assess barriers to discharge/monitor patient progress toward functional and medical goals  Care Tool:  Bathing     Body parts bathed by patient: Right arm, Left arm, Chest, Abdomen, Front perineal area, Buttocks, Right upper leg, Left upper leg, Face, Right lower leg, Left lower leg   Body parts bathed by helper: Right lower leg, Left lower leg     Bathing assist Assist Level: Set up assist     Upper Body Dressing/Undressing Upper body dressing   What is the patient wearing?: Pull over shirt    Upper body assist Assist Level: Set up assist    Lower Body Dressing/Undressing Lower body dressing      What is the patient wearing?: Pants     Lower body assist Assist for lower body dressing: Set up assist     Toileting Toileting    Toileting assist Assist for toileting: Minimal Assistance - Patient > 75%     Transfers Chair/bed transfer  Transfers assist     Chair/bed transfer assist level: Contact Guard/Touching assist     Locomotion Ambulation   Ambulation assist      Assist level: Minimal Assistance - Patient > 75% Assistive device: Walker-rolling Max distance: 55   Walk 10 feet activity   Assist     Assist level: Minimal Assistance - Patient > 75% Assistive device: Walker-rolling   Walk 50 feet activity   Assist    Assist level: Minimal Assistance - Patient > 75% Assistive device: Walker-rolling    Walk 150 feet activity   Assist Walk 150 feet activity did not occur: Safety/medical concerns         Walk 10 feet on uneven surface  activity   Assist Walk 10 feet on uneven surfaces activity did not occur: Safety/medical concerns         Wheelchair     Assist Is the patient using a wheelchair?: Yes Type of Wheelchair: Manual    Wheelchair assist level: Supervision/Verbal cueing Max wheelchair distance: 100    Wheelchair 50 feet with 2 turns activity    Assist        Assist Level: Supervision/Verbal cueing   Wheelchair 150 feet activity     Assist      Assist Level: Moderate Assistance - Patient 50 - 74%   Blood  pressure 109/60, pulse 60, temperature 98.9 F (37.2 C), temperature source Oral, resp. rate 18, height '5\' 7"'$  (1.702 m), weight 72.3 kg, SpO2 95 %.  Medical Problem List and Plan: 1. Functional deficits secondary to Left total hip replacement anterior approach 54/09/8117 complicated by acute metabolic encephalopathy.  Weightbearing as tolerated..             -patient may shower             -ELOS/Goals: est discharge 11/29/21, mod A for memory -Continue CIR PT, OT, SLP 2.  Antithrombotics: -DVT/anticoagulation:  Pharmaceutical: Eliquis             -  antiplatelet therapy: N/A 3. Postoperative pain: continue Robaxin and oxycodone as needed 4. Mood/Behavior/Sleep: Provide emotional support             -antipsychotic agents: N/A 5. Neuropsych/cognition: This patient is capable of making decisions on his own behalf. 6.  Left heel pressure ulcer.  Continue Prevalon boot.  Paint left heel wound with Betadine daily, allowed to air dry.  Routine skin checks 7. Fluids/Electrolytes/Nutrition: Routine in and outs with follow-up chemistries 8.  Acute blood loss anemia with history of essential thrombocytopenia/coagulopathy/leukocytosis.  Maintained on Hydrea. follow-up hematology services.    Latest Ref Rng & Units 11/20/2021    6:08 AM 11/19/2021    4:58 AM 11/18/2021    5:13 AM  CBC  WBC 4.0 - 10.5 K/uL 31.2  32.2  26.3   Hemoglobin 13.0 - 17.0 g/dL 9.6  10.2  10.2   Hematocrit 39.0 - 52.0 % 30.1  32.6  32.1   Platelets 150 - 400 K/uL 174  185  197     -labs appear stable 9.  Aortic stenosis with aortic valve replacement/CABG 01/03/2018.  Continue home Eliquis 10.  Atrial fibrillation.  Cardiac rate controlled.  Continue Lopressor 25 mg twice daily.  Continue Eliquis Vitals:   11/27/21 0445 11/27/21 1304  BP:  109/60  Pulse:  60  Resp:  18  Temp:  98.9 F (37.2 C)  SpO2: 91% 95%  Was on prn lasix at home for leg swelling along with KCL, BPs are soft  -11/9 HR 60-92, controlled overall,  continue to monitor 11.  Dural venous sinus thrombosis.  Confirmed by CT venogram 10/23.  Patient has been transitioned back to Eliquis 12.  Acute metabolic encephalopathy.  EEG negative.  Ammonia levels within normal limits.  CT MRI unremarkable. 13.  Acute on chronic systolic congestive heart failure.  Monitor for any signs of fluid overload Filed Weights   11/25/21 0501 11/26/21 0500 11/27/21 0445  Weight: 70.9 kg 72.1 kg 72.3 kg   Pretib edema ,restart low dose lasix , check BMET 11/7 weight are decreasing , mobilizing fluid no diuretics on board since one time lasix dose  11/9 weights stable, continue to monitor 14.  BPH/urinary retention.  Flomax has been resumed 0.4 mg daily.  Foley catheter tube removed 11/11/2021 patient noted to have failed voiding trial Foley tube reinserted 11/12/2021 and would remain in place.DO NOT REMOVE FOLEY.  Plan follow-up outpatient with Dr. Louis Meckel urology services -11/9 Foley draining clear yellow urine, consider stopping flomax  15.  Hyperlipidemia.  Crestor 16.  GERD.  Protonix  17.  Mild cellulitis L heel- Keflex completed  18. Moderate malnutrition  -RD following, monitor intake  LOS: 8 days A FACE TO FACE EVALUATION WAS PERFORMED  Jennye Boroughs 11/27/2021, 5:27 PM

## 2021-11-27 NOTE — Progress Notes (Signed)
Pt reported nasal bleeding. Nurse assessed; pressure applied to nares. Rechecked 10 minutes later, nasal bleeding continues. Justin Hoff PA notified; Cammy Copa advised. Awaiting Rhino Rocket. Care ongoing.   Gladstone Lighter, LPN

## 2021-11-27 NOTE — Progress Notes (Signed)
Occupational Therapy Session Note  Patient Details  Name: Justin [name] MRN: [id number] Date of Birth: 1955-09-11  Today's Date: 11/27/2021 OT Individual Time: 0930-1015 OT Individual Time Calculation (min): 45 min    Short Term Goals: Week 1:  OT Short Term Goal 1 (Week 1): STGs = LTGs  Skilled Therapeutic Interventions/Progress Updates:    Pt received in bed and stated he did not want to shower today due to his heel hurting.  He is agreeable to doing so tomorrow.  Pt did agree to exercise, he sat to EOB independently and worked on standing from low bed (which he has at home) with close S. Pt used RW to step to w/c.   Pt taken to gym to work on arm bike. Pt felt resistance 1 was challenging. Pt did 3-4 min for 5 cycles.  He then worked on UE strength by using medium ball and squeezing hands into ball and lifting overhead, side to side and down in cross chops to challenge shoulder strength and endurance.   Pt self propelled back to his room. Pt in room with all needs met.   Therapy Documentation Precautions:  Precautions Precautions: None Restrictions Weight Bearing Restrictions: Yes LUE Weight Bearing: Weight bearing as tolerated LLE Weight Bearing: Weight bearing as tolerated Other Position/Activity Restrictions: anterior hip precautions   Vital Signs: Oxygen Therapy SpO2: 91 % O2 Device: Room Air Pain: Pain Assessment Pain Scale: 0-10 Pain Score: 5  Pain Location: Heel Pain Orientation: Left Pain Descriptors / Indicators: Burning Pain Onset: Gradual Pain Intervention(s): Medication (See eMAR);RN made aware ADL: ADL Eating: Independent Grooming: Setup Upper Body Bathing: Setup Where Assessed-Upper Body Bathing: Shower Lower Body Bathing: Supervision/safety Where Assessed-Lower Body Bathing: Shower Upper Body Dressing: Setup Lower Body Dressing:  (min for socks) Toileting: Contact guard Where Assessed-Toileting: Glass blower/designer: Journalist, newspaper: Raised toilet seat Social research officer, government: Curator Method: Heritage manager: Grab bars, Shower seat with back  Therapy/Group: Individual Therapy  [place] 11/27/2021, 8:30 AM

## 2021-11-27 NOTE — Progress Notes (Signed)
Speech Language Pathology Daily Session Note  Patient Details  Name: Justin [name] MRN: [id number] Date of Birth: Sep 27, 1955  Today's Date: 11/27/2021 SLP Individual Time: 0800-0900 SLP Individual Time Calculation (min): 60 min  Short Term Goals: Week 1: SLP Short Term Goal 1 (Week 1): STG=LTG due to short ELOS  Skilled Therapeutic Interventions: Skilled ST treatment focused on cognitive goals. SLP facilitated session by re-assessing pt's cognitive-linguistic skills with the SLUMS. Pt scored 24/30 points (n=27). Pt's score increased 8 points since initial evaluation on 11/22/21. Pt continues to exhibit decreased short-term recall as evidenced by primarily decreased accuracy on memory components. Pt benefited from semantic cues.   SLP facilitated functional problem solving using the ALFA "solving daily math problems" subtest. Pt obtained 7/10 accuracy at independent level progressing to 10/10 with sup A verbal cues and extended time.   Pt reports significant improvements with cognition since admission and feels he nearing his cognitive baseline, however continues to endorse decreased recall and "not fully back" to PLOF.   Patient was left in bed with alarm activated and immediate needs within reach at end of session. Continue per current plan of care.     Pain Pain Assessment Pain Scale: 0-10 Pain Score: 5  Pain Location: Heel Pain Orientation: Left Pain Descriptors / Indicators: Burning Pain Onset: Gradual Pain Intervention(s): Medication (See eMAR);RN made aware  Therapy/Group: Individual Therapy  Patty Sermons 11/27/2021, 8:17 AM

## 2021-11-28 ENCOUNTER — Other Ambulatory Visit (HOSPITAL_COMMUNITY): Payer: Self-pay

## 2021-11-28 ENCOUNTER — Encounter: Payer: Self-pay | Admitting: Family

## 2021-11-28 DIAGNOSIS — R5381 Other malaise: Principal | ICD-10-CM

## 2021-11-28 MED ORDER — TAMSULOSIN HCL 0.4 MG PO CAPS
0.4000 mg | ORAL_CAPSULE | Freq: Every day | ORAL | 0 refills | Status: AC
  Filled 2021-11-28: qty 30, 30d supply, fill #0

## 2021-11-28 MED ORDER — NITROGLYCERIN 0.4 MG SL SUBL
0.4000 mg | SUBLINGUAL_TABLET | SUBLINGUAL | 0 refills | Status: AC | PRN
  Filled 2021-11-28: qty 20, 5d supply, fill #0

## 2021-11-28 MED ORDER — LORATADINE 10 MG PO TABS
10.0000 mg | ORAL_TABLET | Freq: Every day | ORAL | 0 refills | Status: AC
  Filled 2021-11-28: qty 30, 30d supply, fill #0

## 2021-11-28 MED ORDER — METHOCARBAMOL 500 MG PO TABS
500.0000 mg | ORAL_TABLET | Freq: Four times a day (QID) | ORAL | 0 refills | Status: AC | PRN
  Filled 2021-11-28: qty 60, 15d supply, fill #0

## 2021-11-28 MED ORDER — HYDROXYUREA 500 MG PO CAPS
ORAL_CAPSULE | ORAL | 0 refills | Status: AC
  Filled 2021-11-28: qty 60, fill #0

## 2021-11-28 MED ORDER — ROSUVASTATIN CALCIUM 20 MG PO TABS
20.0000 mg | ORAL_TABLET | Freq: Every day | ORAL | 0 refills | Status: AC
  Filled 2021-11-28: qty 30, 30d supply, fill #0

## 2021-11-28 MED ORDER — POTASSIUM CHLORIDE CRYS ER 10 MEQ PO TBCR
10.0000 meq | EXTENDED_RELEASE_TABLET | Freq: Every day | ORAL | 0 refills | Status: AC
  Filled 2021-11-28: qty 30, 30d supply, fill #0

## 2021-11-28 MED ORDER — OXYCODONE HCL 5 MG PO TABS
5.0000 mg | ORAL_TABLET | ORAL | 0 refills | Status: DC | PRN
  Filled 2021-11-28: qty 30, 5d supply, fill #0

## 2021-11-28 MED ORDER — OXYMETAZOLINE HCL 0.05 % NA SOLN
1.0000 | Freq: Two times a day (BID) | NASAL | Status: DC | PRN
  Administered 2021-11-28 (×2): 1 via NASAL
  Filled 2021-11-28: qty 30

## 2021-11-28 MED ORDER — FUROSEMIDE 20 MG PO TABS
10.0000 mg | ORAL_TABLET | Freq: Every day | ORAL | 0 refills | Status: AC
  Filled 2021-11-28: qty 30, 60d supply, fill #0

## 2021-11-28 MED ORDER — ALBUTEROL SULFATE HFA 108 (90 BASE) MCG/ACT IN AERS
1.0000 | INHALATION_SPRAY | Freq: Four times a day (QID) | RESPIRATORY_TRACT | 0 refills | Status: AC | PRN
  Filled 2021-11-28: qty 6.7, 30d supply, fill #0

## 2021-11-28 MED ORDER — MAGNESIUM OXIDE 400 MG PO TABS
400.0000 mg | ORAL_TABLET | Freq: Every day | ORAL | 0 refills | Status: AC
  Filled 2021-11-28: qty 30, 30d supply, fill #0

## 2021-11-28 MED ORDER — ACETAMINOPHEN 325 MG PO TABS: 325.0000 mg | ORAL_TABLET | Freq: Four times a day (QID) | ORAL | Status: DC | PRN

## 2021-11-28 MED ORDER — OXYMETAZOLINE HCL 0.05 % NA SOLN: 1.0000 | Freq: Two times a day (BID) | NASAL | Status: DC

## 2021-11-28 MED ORDER — METOPROLOL TARTRATE 25 MG PO TABS
25.0000 mg | ORAL_TABLET | Freq: Two times a day (BID) | ORAL | 0 refills | Status: AC
  Filled 2021-11-28: qty 60, 30d supply, fill #0

## 2021-11-28 MED ORDER — SUCRALFATE 1 G PO TABS
1.0000 g | ORAL_TABLET | Freq: Two times a day (BID) | ORAL | 0 refills | Status: AC
  Filled 2021-11-28: qty 60, 30d supply, fill #0

## 2021-11-28 MED ORDER — CHOLECALCIFEROL 125 MCG (5000 UT) PO TABS
5000.0000 [IU] | ORAL_TABLET | Freq: Every day | ORAL | 0 refills | Status: AC
  Filled 2021-11-28: qty 30, 30d supply, fill #0

## 2021-11-28 MED ORDER — ELIQUIS 5 MG PO TABS
5.0000 mg | ORAL_TABLET | Freq: Two times a day (BID) | ORAL | 0 refills | Status: AC
  Filled 2021-11-28: qty 60, 30d supply, fill #0

## 2021-11-28 MED ORDER — FERROUS SULFATE 325 (65 FE) MG PO TABS
325.0000 mg | ORAL_TABLET | Freq: Two times a day (BID) | ORAL | 0 refills | Status: AC
  Filled 2021-11-28: qty 60, 30d supply, fill #0

## 2021-11-28 MED ORDER — PANTOPRAZOLE SODIUM 40 MG PO TBEC
40.0000 mg | DELAYED_RELEASE_TABLET | Freq: Every day | ORAL | 0 refills | Status: AC
  Filled 2021-11-28: qty 30, 30d supply, fill #0

## 2021-11-28 MED ORDER — POLYETHYLENE GLYCOL 3350 17 G PO PACK: 17.0000 g | PACK | Freq: Every day | ORAL | 0 refills | Status: AC

## 2021-11-28 NOTE — TOC Transition Note (Signed)
Discharge medications (12) are being stored in the main pharmacy on the ground floor until patient is ready for discharge.

## 2021-11-28 NOTE — Progress Notes (Signed)
Pt reported shortness of breath and dizziness to Nurse tech. Nurse tech notified nurse of this. Nurse entered and checked full set of vitals. Vitals stable; upon readjusting patient's position, patient stated that "I feel better." Linna Hoff PA notified. Care ongoing.     [place]ad[place] Lighter, LPN

## 2021-11-28 NOTE — Plan of Care (Signed)
  Problem: RH Balance Goal: LTG Patient will maintain dynamic standing with ADLs (OT) Description: LTG:  Patient will maintain dynamic standing balance with assist during activities of daily living (OT)  Outcome: Adequate for Discharge   Problem: Sit to Stand Goal: LTG:  Patient will perform sit to stand in prep for activites of daily living with assistance level (OT) Description: LTG:  Patient will perform sit to stand in prep for activites of daily living with assistance level (OT) Outcome: Adequate for Discharge   Problem: RH Toileting Goal: LTG Patient will perform toileting task (3/3 steps) with assistance level (OT) Description: LTG: Patient will perform toileting task (3/3 steps) with assistance level (OT)  Outcome: Adequate for Discharge   Problem: RH Toilet Transfers Goal: LTG Patient will perform toilet transfers w/assist (OT) Description: LTG: Patient will perform toilet transfers with assist, with/without cues using equipment (OT) Outcome: Adequate for Discharge   Problem: RH Bathing Goal: LTG Patient will bathe all body parts with assist levels (OT) Description: LTG: Patient will bathe all body parts with assist levels (OT) Outcome: Completed/Met   Problem: RH Dressing Goal: LTG Patient will perform upper body dressing (OT) Description: LTG Patient will perform upper body dressing with assist, with/without cues (OT). Outcome: Completed/Met Goal: LTG Patient will perform lower body dressing w/assist (OT) Description: LTG: Patient will perform lower body dressing with assist, with/without cues in positioning using equipment (OT) Outcome: Completed/Met   Problem: RH Tub/Shower Transfers Goal: LTG Patient will perform tub/shower transfers w/assist (OT) Description: LTG: Patient will perform tub/shower transfers with assist, with/without cues using equipment (OT) Outcome: Completed/Met

## 2021-11-28 NOTE — Progress Notes (Signed)
Inpatient Rehabilitation Discharge Medication Review by a Pharmacist  A complete drug regimen review was completed for this patient to identify any potential clinically significant medication issues.  High Risk Drug Classes Is patient taking? Indication by Medication  Antipsychotic No   Anticoagulant Yes Eliquis - Afib, thrombus   Antibiotic No   Opioid Yes Prn oxycodone - pain  Antiplatelet No   Hypoglycemics/insulin No   Vasoactive Medication Yes Furosemide - fluid Metoprolol - BP Tamsulosin - BPH  Chemotherapy No   Other Yes Prn Albuterol - SOB Magnesium - supplement Potassium - supplement Prn nitro - CP Pantoprazole, sucralfate - reflux  Methocarbamol - spasms  Hydroxyurea - thrombocytemia   Rosuvastatin - HLD     Type of Medication Issue Identified Description of Issue Recommendation(s)  Drug Interaction(s) (clinically significant)     Duplicate Therapy     Allergy     No Medication Administration End Date     Incorrect Dose     Additional Drug Therapy Needed     Significant med changes from prior encounter (inform family/care partners about these prior to discharge).    Other       Clinically significant medication issues were identified that warrant physician communication and completion of prescribed/recommended actions by midnight of the next day:  No  Time spent performing this drug regimen review (minutes): 20 minutes   Thank you Anette Guarneri, PharmD

## 2021-11-28 NOTE — Progress Notes (Signed)
Inpatient Rehabilitation Care Coordinator Discharge Note   Patient Details  Name: Justin [name] MRN: [id number] Date of Birth: 28-Sep-1955   Discharge location: Home  Length of Stay: 10 Days  Discharge activity level: Sup/Cga  Home/community participation: spouse  Patient response CB:SWHQPR Literacy - How often do you need to have someone help you when you read instructions, pamphlets, or other written material from your doctor or pharmacy?: Never  Patient response FF:MBWGYK Isolation - How often do you feel lonely or isolated from those around you?: Never  Services provided included: SW, Neuropsych, Pharmacy, TR, CM, RN, SLP, OT, RD, PT, MD  Financial Services:  Financial Services Utilized: [place] offered to/list presented to:    Follow-up services arranged:  [place]: [facility]         Patient response to transportation need: Is the patient able to respond to transportation needs?: Yes In the past 12 months, has lack of transportation kept you from medical appointments or from getting medications?: No In the past 12 months, has lack of transportation kept you from meetings, work, or from getting things needed for daily living?: No    Comments (or additional information):  Patient/Family verbalized understanding of follow-up arrangements:     Individual responsible for coordination of the follow-up plan: self/ [name] [phone]  Confirmed correct DME delivered: Dyanne Iha 11/28/2021    Dyanne Iha

## 2021-11-28 NOTE — Progress Notes (Signed)
PROGRESS NOTE   Subjective/Complaints:  No new concerns or complaints.  Had episode of dizziness earlier but it resolved with repositioning and did not reoccur.    ROS: neg CP, SOB, N/V/D , Abdominal pain, HA, Cough, abdominal pain, constipation   Objective:   No results found. No results for input(s): "WBC", "HGB", "HCT", "PLT" in the last 72 hours.  No results for input(s): "NA", "K", "CL", "CO2", "GLUCOSE", "BUN", "CREATININE", "CALCIUM" in the last 72 hours.   Intake/Output Summary (Last 24 hours) at 11/28/2021 1421 Last data filed at 11/28/2021 1407 Gross per 24 hour  Intake 832 ml  Output 2950 ml  Net -2118 ml      Pressure Injury 11/17/21 Heel Left Unstageable - Full thickness tissue loss in which the base of the injury is covered by slough (yellow, tan, gray, green or brown) and/or eschar (tan, brown or black) in the wound bed. pressure injury (Active)  11/17/21 0612  Location: Heel  Location Orientation: Left  Staging: Unstageable - Full thickness tissue loss in which the base of the injury is covered by slough (yellow, tan, gray, green or brown) and/or eschar (tan, brown or black) in the wound bed.  Wound Description (Comments): pressure injury  Present on Admission: Yes (present on transfer to 4E/Urology)     Pressure Injury 11/19/21 Coccyx Mid;Lower Stage 2 -  Partial thickness loss of dermis presenting as a shallow open injury with a red, pink wound bed without slough. (Active)  11/19/21 1625  Location: Coccyx  Location Orientation: Mid;Lower  Staging: Stage 2 -  Partial thickness loss of dermis presenting as a shallow open injury with a red, pink wound bed without slough.  Wound Description (Comments):   Present on Admission: Yes     Pressure Injury 11/19/21 Sacrum Left Stage 2 -  Partial thickness loss of dermis presenting as a shallow open injury with a red, pink wound bed without slough. (Active)   11/19/21 1625  Location: Sacrum  Location Orientation: Left  Staging: Stage 2 -  Partial thickness loss of dermis presenting as a shallow open injury with a red, pink wound bed without slough.  Wound Description (Comments):   Present on Admission: Yes     Pressure Injury 11/19/21 Sacrum Right Stage 2 -  Partial thickness loss of dermis presenting as a shallow open injury with a red, pink wound bed without slough. (Active)  11/19/21 1625  Location: Sacrum  Location Orientation: Right  Staging: Stage 2 -  Partial thickness loss of dermis presenting as a shallow open injury with a red, pink wound bed without slough.  Wound Description (Comments):   Present on Admission: Yes    Physical Exam: Vital Signs Blood pressure (!) 121/58, pulse 82, temperature (!) 97.5 F (36.4 C), temperature source Oral, resp. rate 16, height '5\' 7"'$  (1.702 m), weight 74.5 kg, SpO2 90 %.    General: No acute distress, seen at bedside Mood and affect are appropriate, pleasant  Heart: Regular rate and rhythm no rubs murmurs or extra sounds Lungs: Clear to auscultation, breathing unlabored, no rales or wheezes, good air movement, normal rate, no increased WOB Abdomen: Positive bowel sounds, soft nontender to palpation,  nondistended Extremities: No clubbing, cyanosis, or edema   Skin: sacral, coccyx, heel issues as above Neurologic: A & O x 3 , motor strength is 5/5 in bilateral deltoid, bicep, tricep, grip, 5/5 Right and 4/5 Left hip flexor, knee extensors,5/5 Bilateral ankle dorsiflexor and plantar flexor, decreased memory   Musculoskeletal: Full range of motion in all 4 extremities. No joint swelling   Assessment/Plan: 1. Functional deficits which require 3+ hours per day of interdisciplinary therapy in a comprehensive inpatient rehab setting. Physiatrist is providing close team supervision and 24 hour management of active medical problems listed below. Physiatrist and rehab team continue to assess  barriers to discharge/monitor patient progress toward functional and medical goals  Care Tool:  Bathing    Body parts bathed by patient: Right arm, Left arm, Chest, Abdomen, Front perineal area, Buttocks, Right upper leg, Left upper leg, Face, Right lower leg, Left lower leg   Body parts bathed by helper: Right lower leg, Left lower leg     Bathing assist Assist Level: Supervision/Verbal cueing     Upper Body Dressing/Undressing Upper body dressing   What is the patient wearing?: Pull over shirt    Upper body assist Assist Level: Independent    Lower Body Dressing/Undressing Lower body dressing      What is the patient wearing?: Pants     Lower body assist Assist for lower body dressing: Set up assist     Toileting Toileting    Toileting assist Assist for toileting: Supervision/Verbal cueing     Transfers Chair/bed transfer  Transfers assist     Chair/bed transfer assist level: Supervision/Verbal cueing     Locomotion Ambulation   Ambulation assist      Assist level: Minimal Assistance - Patient > 75% Assistive device: Walker-rolling Max distance: 55   Walk 10 feet activity   Assist     Assist level: Minimal Assistance - Patient > 75% Assistive device: Walker-rolling   Walk 50 feet activity   Assist    Assist level: Minimal Assistance - Patient > 75% Assistive device: Walker-rolling    Walk 150 feet activity   Assist Walk 150 feet activity did not occur: Safety/medical concerns         Walk 10 feet on uneven surface  activity   Assist Walk 10 feet on uneven surfaces activity did not occur: Safety/medical concerns         Wheelchair     Assist Is the patient using a wheelchair?: Yes Type of Wheelchair: Manual    Wheelchair assist level: Supervision/Verbal cueing Max wheelchair distance: 100    Wheelchair 50 feet with 2 turns activity    Assist        Assist Level: Supervision/Verbal cueing    Wheelchair 150 feet activity     Assist      Assist Level: Moderate Assistance - Patient 50 - 74%   Blood pressure (!) 121/58, pulse 82, temperature (!) 97.5 F (36.4 C), temperature source Oral, resp. rate 16, height '5\' 7"'$  (1.702 m), weight 74.5 kg, SpO2 90 %.  Medical Problem List and Plan: 1. Functional deficits secondary to Left total hip replacement anterior approach 61/60/7371 complicated by acute metabolic encephalopathy.  Weightbearing as tolerated..             -patient may shower             -ELOS/Goals: est discharge 11/29/21, mod A for memory -Continue CIR PT, OT, SLP -Plan for Discharge home tomorrow 2.  Antithrombotics: -DVT/anticoagulation:  Pharmaceutical: Eliquis             -antiplatelet therapy: N/A 3. Postoperative pain: continue Robaxin and oxycodone as needed 4. Mood/Behavior/Sleep: Provide emotional support             -antipsychotic agents: N/A 5. Neuropsych/cognition: This patient is capable of making decisions on his own behalf. 6.  Left heel pressure ulcer.  Continue Prevalon boot.  Paint left heel wound with Betadine daily, allowed to air dry.  Routine skin checks 7. Fluids/Electrolytes/Nutrition: Routine in and outs with follow-up chemistries 8.  Acute blood loss anemia with history of essential thrombocytopenia/coagulopathy/leukocytosis.  Maintained on Hydrea. follow-up hematology services.    Latest Ref Rng & Units 11/20/2021    6:08 AM 11/19/2021    4:58 AM 11/18/2021    5:13 AM  CBC  WBC 4.0 - 10.5 K/uL 31.2  32.2  26.3   Hemoglobin 13.0 - 17.0 g/dL 9.6  10.2  10.2   Hematocrit 39.0 - 52.0 % 30.1  32.6  32.1   Platelets 150 - 400 K/uL 174  185  197     -labs appear stable 9.  Aortic stenosis with aortic valve replacement/CABG 01/03/2018.  Continue home Eliquis 10.  Atrial fibrillation.  Cardiac rate controlled.  Continue Lopressor 25 mg twice daily.  Continue Eliquis Vitals:   11/28/21 0315 11/28/21 1401  BP: 117/67 (!) 121/58  Pulse: 76  82  Resp: 18 16  Temp: 97.6 F (36.4 C) (!) 97.5 F (36.4 C)  SpO2: 96% 90%  Was on prn lasix at home for leg swelling along with KCL, BPs are soft  -11/10 HR controlled, continue lopressor 11.  Dural venous sinus thrombosis.  Confirmed by CT venogram 10/23.  Patient has been transitioned back to Eliquis 12.  Acute metabolic encephalopathy.  EEG negative.  Ammonia levels within normal limits.  CT MRI unremarkable. 13.  Acute on chronic systolic congestive heart failure.  Monitor for any signs of fluid overload Filed Weights   11/26/21 0500 11/27/21 0445 11/28/21 0315  Weight: 72.1 kg 72.3 kg 74.5 kg   Pretib edema ,restart low dose lasix , check BMET 11/7 weight are decreasing , mobilizing fluid no diuretics on board since one time lasix dose  11/10 weight up a little today, has been stable overall, continue to monitor 14.  BPH/urinary retention.  Flomax has been resumed 0.4 mg daily.  Foley catheter tube removed 11/11/2021 patient noted to have failed voiding trial Foley tube reinserted 11/12/2021 and would remain in place.DO NOT REMOVE FOLEY.  Plan follow-up outpatient with Dr. Louis Meckel urology services -11/9 Foley draining clear yellow urine -Will continue flomax until until urology f/u  15.  Hyperlipidemia.  Crestor 16.  GERD.  Protonix  17.  Mild cellulitis L heel- Keflex completed  18. Moderate malnutrition  -11/10 RD following, eating 25-100% of meals, continue multivitamins, continue to encourage PO intake  LOS: 9 days A FACE TO FACE EVALUATION WAS PERFORMED  Jennye Boroughs 11/28/2021, 2:21 PM

## 2021-11-28 NOTE — Progress Notes (Signed)
Occupational Therapy Discharge Summary  Patient Details  Name: Justin [name] MRN: [id number] Date of Birth: 06/06/1955  Date of Discharge from OT service:November 28, 2021   Patient has met 4 of 8 long term goals due to improved activity tolerance, improved balance, and ability to compensate for deficits.  Patient to discharge at overall Supervision level.   Patient's care partner is independent to provide the necessary physical and cognitive assistance at discharge.    Reasons goals not met: pt initially had several goals set at a mod I level (standing balance, sit to stand, toilet transfers and toileting).  Due to his ongoing fatigue levels, which pt has attributed to lack of sleep in the hospital, pt should have supervision at home initially.  HHOT can progress pt to a mod I level.   Recommendation:  Patient will benefit from ongoing skilled OT services in home health setting to continue to advance functional skills in the area of BADL and iADL.  Equipment: No equipment provided  Reasons for discharge: treatment goals met  Patient/family agrees with progress made and goals achieved: Yes  OT Discharge Precautions/Restrictions  Precautions Precautions: Fall Restrictions LLE Weight Bearing: Weight bearing as tolerated Other Position/Activity Restrictions: anterior hip precautions   ADL ADL Eating: Independent Grooming: Independent Upper Body Bathing: Supervision/safety Where Assessed-Upper Body Bathing: Shower Lower Body Bathing: Supervision/safety Where Assessed-Lower Body Bathing: Shower Upper Body Dressing: Independent Lower Body Dressing: Minimal assistance Toileting: Supervision/safety Where Assessed-Toileting: Toilet Toilet Transfer: Close supervision Toilet Transfer Method: Ambulating Toilet Transfer Equipment: Raised toilet seat Walk-In Shower Transfer: Close supervision Walk-In Shower Transfer Method: Ambulating Walk-In Shower Equipment: Grab bars, Shower  seat with back Vision Baseline Vision/History: 0 No visual deficits Patient Visual Report: No change from baseline Vision Assessment?: No apparent visual deficits Perception  Perception: Within Functional Limits Praxis Praxis: Intact Cognition Cognition Overall Cognitive Status: Impaired/Different from baseline Arousal/Alertness: Awake/alert Orientation Level: Person;Place;Situation Person: Oriented Place: Oriented Situation: Oriented Memory: Impaired Memory Impairment: Storage deficit;Decreased recall of new information Sustained Attention: Impaired Sustained Attention Impairment: Verbal complex Selective Attention: Impaired Selective Attention Impairment: Verbal basic;Functional basic Awareness: Impaired Awareness Impairment: Emergent impairment;Anticipatory impairment Problem Solving: Impaired Problem Solving Impairment: Verbal complex Safety/Judgment: Impaired Brief Interview for Mental Status (BIMS) Repetition of Three Words (First Attempt): 3 Temporal Orientation: Year: Correct Temporal Orientation: Month: Accurate within 5 days Temporal Orientation: Day: Correct Recall: "Sock": Yes, no cue required Recall: "Blue": Yes, no cue required Recall: "Bed": Yes, after cueing ("a piece of furniture") BIMS Summary Score: 14 Sensation Sensation Light Touch: Appears Intact Hot/Cold: Appears Intact Proprioception: Appears Intact Stereognosis: Appears Intact Motor  Motor Motor: Within Functional Limits Mobility    Close S with sit to stands and ambulation due to cues needed for safe technique Trunk/Postural Assessment  Postural Control Postural Control: Within Functional Limits  Balance Static Sitting Balance Static Sitting - Level of Assistance: 7: Independent Dynamic Sitting Balance Dynamic Sitting - Level of Assistance: 6: Modified independent (Device/Increase time) Static Standing Balance Static Standing - Level of Assistance: 6: Modified independent  (Device/Increase time) Dynamic Standing Balance Dynamic Standing - Level of Assistance: 6: Modified independent (Device/Increase time) (during ADLs of toileting) Extremity/Trunk Assessment RUE Assessment General Strength Comments: 4-/ 5 LUE Assessment General Strength Comments: 4/5   , 11/28/2021, 10:32 AM 

## 2021-11-28 NOTE — Progress Notes (Signed)
Pt noted with nasal bleeding this shift. Assessed patient,applied pressure and repositioned head. V/S stable, Notified on call provider (Dr. Read Drivers). Hold Eliquis at bedtime and CBC to be done in the morning per provider. PRN afrin administered and effective. Pt is stable.

## 2021-11-28 NOTE — Progress Notes (Signed)
Speech Language Pathology Discharge Summary  Patient Details  Name: Justin [name] MRN: [id number] Date of Birth: 19-Oct-1955  Date of Discharge from SLP service:November 28, 2021  Today's Date: 11/28/2021 SLP Individual Time: 0810-0900 SLP Individual Time Calculation (min): 50 min and Today's Date: 11/28/2021 SLP Missed Time: 10 Minutes Missed Time Reason: Nursing care  Skilled Therapeutic Interventions: Skilled ST treatment focused on cognitive goals. Pt was received upright in wheelchair on arrival and in good spirits. Pt did not realize today would be his last day of therapy with discharge occurring tomorrow.   SLP facilitated a working memory, attention, and sequencing task using the BITS to recall up to 6 words-to-pictures in sequential order with min A verbal cues to achieve 65% accuracy. Pt completed similar task with words only (as compared to word-to-pictures) by recalling up to 8 words with 75% accuracy given sup A verbal cues.   SLP then facilitated a complex alternating attention task via trail making task in which pt alternated between numbers and letters in a sequence up to 12x with min-to-mod A verbal/visual cues.  Patient was left in wheelchair with alarm activated and immediate needs within reach at end of session. Continue per current plan of care.    Patient has met   of   long term goals.  Patient to discharge at overall   level.  Reasons goals not met: all goals met   Clinical Impression/Discharge Summary:  Patient has made functional gains and has met 5 of 5 long-term goals this admission. Patient is currently completing functional and mildly complex cognitive tasks with supervision-to-min A cues in regards to problem solving, attention, functional recall, and higher level awareness. Patient education is complete and patient to discharge at overall supervision level. Patient's care partner is independent to provide the necessary physical and cognitive assistance at  discharge. Recommend pt have assistance with iADL tasks such as medication and financial management. Patient would benefit from continued SLP services in home health setting to maximize cognitive-linguistic function and functional independence.   Care Partner:  Caregiver Able to Provide Assistance: Yes  Type of Caregiver Assistance: Physical;Cognitive  Recommendation:  24 hour supervision/assistance;Home Health SLP  Rationale for SLP Follow Up: Maximize cognitive function and independence   Equipment: NA   Reasons for discharge: Treatment goals met   Patient/Family Agrees with Progress Made and Goals Achieved: Yes    Madysun Thall T Lenore Moyano 11/28/2021, 12:18 PM

## 2021-11-28 NOTE — Plan of Care (Signed)
  Problem: RH Problem Solving Goal: LTG Patient will demonstrate problem solving for (SLP) Description: LTG:  Patient will demonstrate problem solving for basic/complex daily situations with cues  (SLP) Outcome: Completed/Met   Problem: RH Memory Goal: LTG Patient will demonstrate ability for day to day (SLP) Description: LTG:   Patient will demonstrate ability for day to day recall/carryover during cognitive/linguistic activities with assist  (SLP) Outcome: Completed/Met Goal: LTG Patient will use memory compensatory aids to (SLP) Description: LTG:  Patient will use memory compensatory aids to recall biographical/new, daily complex information with cues (SLP) Outcome: Completed/Met   Problem: RH Attention Goal: LTG Patient will demonstrate this level of attention during functional activites (SLP) Description: LTG:  Patient will will demonstrate this level of attention during functional activites (SLP) Outcome: Completed/Met   Problem: RH Awareness Goal: LTG: Patient will demonstrate awareness during functional activites type of (SLP) Description: LTG: Patient will demonstrate awareness during functional activites type of (SLP) Outcome: Completed/Met

## 2021-11-28 NOTE — Progress Notes (Signed)
Occupational Therapy Session Note  Patient Details  Name: Justin [name] MRN: [id number] Date of Birth: [birth year]  Today's Date: 11/28/2021 OT Individual Time: 0930-1020 OT Individual Time Calculation (min): 50 min  and Today's Date: 11/28/2021 OT Missed Time: 25 Minutes Missed Time Reason: Patient fatigue   Short Term Goals: Week 1:  OT Short Term Goal 1 (Week 1): STGs = LTGs  Skilled Therapeutic Interventions/Progress Updates:    Pt received in wc dressed for the day. Pt stated that he had already used the toilet and was able to cleanse himself and manage his clothing.  Discussed with pt that we had planned to do a shower today but pt stated he was very fatigued from lack of sleep.  Tried several options to have him engage, but pt felt it was too much and he was too tired. "When I am home and can finally rest I will do a shower with my wife helping". Pt not concerned about his ability to do things at home but tried to remind pt he is still needing safety cues. His extreme sleepiness does play a role in him being able to fully attend and remember.  The PA was present and reviewed medications with pt just before the start of the session. About 15 min later, pt asking me about his medications and could not recall what PA said. Contacted PA and he returned to review meds with pt again.   Reviewed with pt that he will need Supervision walking to and from the bathroom at home until he is more energetic, will need help from spouse to feed catheter tubing through pant leg and to don socks.  He has difficulty bending and the sock aid is not appropriate due to heel pain and heel wound.   Pt wanted to get in bed to work on oral care/ dentures.  Cued pt that sitting at sink with running water would make more sense and pt agreed. Pt sat at sink and worked on dentures for quite some time. Had pt stand from w/c to walker with close S and cues to bring R foot back for improved base of support as he had B  feet too far in front of him and scooting too close to the edge.  Pt then used RW to bed with S and able to move to supine without help. I do agree with pt that the lack of sleep is taking a toll on him. He has struggled fully participating.  Pt resting in bed with all needs met.   Bed alarm set.   Therapy Documentation Precautions:  Precautions Precautions: Fall Restrictions Weight Bearing Restrictions: Yes LUE Weight Bearing: Weight bearing as tolerated LLE Weight Bearing: Weight bearing as tolerated Other Position/Activity Restrictions: anterior hip precautions   Pain:   "My heel is still hurting, but I got medication for it" ADL: ADL Eating: Independent Grooming: Independent Upper Body Bathing: Supervision/safety Where Assessed-Upper Body Bathing: Shower Lower Body Bathing: Supervision/safety Where Assessed-Lower Body Bathing: Shower Upper Body Dressing: Independent Lower Body Dressing: Minimal assistance (to help manage catheter) Toileting: Supervision/safety Where Assessed-Toileting: Glass blower/designer: Close supervision Armed forces technical officer Method: Counselling psychologist: Raised toilet Print production planner: Close supervision Social research officer, government Method: Heritage manager: Grab bars, Shower seat with back   Therapy/Group: Individual Therapy  Cassady Turano 11/28/2021, 10:28 AM

## 2021-11-28 NOTE — Progress Notes (Signed)
Physical Therapy Discharge Summary  Patient Details  Name: Justin [name] MRN: [id number] Date of Birth: 1955/08/27  Date of Discharge from PT service:November 28, 2021  Today's Date: 11/28/2021 PT Individual Time:1545-1645   60 min    Patient has met 10 of 13 long term goals due to improved activity tolerance, improved balance, increased strength, decreased pain, and functional use of  left lower extremity.  Patient to discharge at an ambulatory level Modified Independent with RW for houehold distances.   Patient's care partner  is available for supervision assist  to provide the necessary physical assistance at discharge.  Reasons goals not met: pain in the L heel and dizziness from nosebleed limit distance of gait at d/c  Recommendation:  Patient will benefit from ongoing skilled PT services in home health setting to continue to advance safe functional mobility, address ongoing impairments in balance, strength, wound care, transfers, and minimize fall risk.  Equipment: RW and transport WC  Reasons for discharge: treatment goals met and discharge from hospital  Patient/family agrees with progress made and goals achieved: Yes  PT treatment:  Pt received sitting EOB  and agreeable to PT. Pt noted to have nose bleed. Return in 15 min and still bleeding, but agreeable to attempt PT. Bed mobility for sit>supine on the R side to simulate home set up without assist. Gait training in room x 26f without cues or assist. Mild antalgia on the LLE due to heel pain from wound. PT instructed pt in Grad day assessment to measure progress toward goals. See below for details. CARETool mobility assessment  also completed; see CAREtool tab in navigator for details. Education for HEP to perform bed level therex to continue to improve strength in the L hip while maintaining anterior hip precautions.  Pt left sitting EOB with call bell in reach and all needs met.    PT  Discharge Precautions/Restrictions Precautions Precautions: Fall Restrictions LLE Weight Bearing: Weight bearing as tolerated Other Position/Activity Restrictions: anterior hip precautions Vital Signs Therapy Vitals Temp: (!) 97.5 F (36.4 C) Temp Source: Oral Pulse Rate: 82 Resp: 16 BP: (!) 121/58 Patient Position (if appropriate): Sitting Oxygen Therapy SpO2: 90 % O2 Device: Room Air Pain Pain Assessment Pain Scale: 0-10 Pain Score: 5  Pain Type: Surgical pain Pain Location: Leg Pain Orientation: Left Pain Descriptors / Indicators: Aching;Burning Patients Stated Pain Goal: 0 Pain Intervention(s): Medication (See eMAR) Pain Interference Pain Interference Pain Effect on Sleep: 2. Occasionally Pain Interference with Therapy Activities: 2. Occasionally Pain Interference with Day-to-Day Activities: 2. Occasionally Vision/Perception  Vision - History Ability to See in Adequate Light: 0 Adequate Vision - Assessment Eye Alignment: Within Functional Limits Alignment/Gaze Preference: Within Defined Limits Perception Perception: Within Functional Limits Praxis Praxis: Intact  Cognition Overall Cognitive Status: Impaired/Different from baseline Arousal/Alertness: Awake/alert Orientation Level: Oriented X4 Sustained Attention: Impaired Sustained Attention Impairment: Verbal complex Selective Attention: Impaired Selective Attention Impairment: Verbal basic;Functional basic Memory: Impaired Memory Impairment: Storage deficit;Decreased recall of new information Awareness: Impaired Awareness Impairment: Emergent impairment;Anticipatory impairment Problem Solving: Impaired Problem Solving Impairment: Verbal complex Safety/Judgment: Impaired Sensation Sensation Light Touch: Appears Intact Proprioception: Appears Intact Additional Comments: pt reports mild parasthesia in lateral aspect of BLE Coordination Gross Motor Movements are Fluid and Coordinated: Yes Fine Motor  Movements are Fluid and Coordinated: Yes Finger Nose Finger Test: [facility] Test: limited ROM on the LLE from surgery Motor  Motor Motor: Within Functional Limits  Mobility Bed Mobility Bed Mobility: Rolling Right;Sit to Supine;Supine to Sit  Rolling Right: Independent with assistive device Right Sidelying to Sit: Independent with assistive device Supine to Sit: Independent with assistive device Sitting - Scoot to Edge of Bed: Independent with assistive device Transfers Transfers: Sit to Stand;Stand to Sit;Stand Pivot Transfers;Transfer Sit to Stand: Independent with assistive device Stand to Sit: Independent with assistive device Stand Pivot Transfers: Independent with assistive device Transfer (Assistive device): Rolling walker Locomotion  Gait Gait Assistance: Independent with assistive device Gait Distance (Feet): 25 Feet Assistive device: Rolling walker Gait Gait: Yes Gait Pattern: Impaired Gait Pattern: Antalgic Stairs / Additional Locomotion Stairs: No Wheelchair Mobility Wheelchair Mobility: Yes Wheelchair Assistance: Chartered loss adjuster: Both upper extremities Wheelchair Parts Management: Needs assistance Distance: 150  Trunk/Postural Assessment  Cervical Assessment Cervical Assessment: Within Functional Limits Thoracic Assessment Thoracic Assessment: Within Functional Limits Lumbar Assessment Lumbar Assessment: Within Functional Limits Postural Control Postural Control: Within Functional Limits  Balance Static Sitting Balance Static Sitting - Level of Assistance: 7: Independent Dynamic Sitting Balance Dynamic Sitting - Level of Assistance: 6: Modified independent (Device/Increase time) Static Standing Balance Static Standing - Level of Assistance: 6: Modified independent (Device/Increase time) Dynamic Standing Balance Dynamic Standing - Balance Support: Bilateral upper extremity supported;During functional activity Dynamic  Standing - Level of Assistance: 6: Modified independent (Device/Increase time) (during ADLs of toileting) Extremity Assessment      RLE Assessment RLE Assessment: Within Functional Limits General Strength Comments: 4+/5 for knee flexion & extension. 4+/5 in all hip directions LLE Assessment LLE Assessment: Exceptions to [facility] Active Range of Motion (AROM) Comments: Full knee extension & ankle dorsiflexion limited LLE AROM (degrees) Overall AROM Left Lower Extremity: Due to precautions;Due to pain;Deficits LLE Strength LLE Overall Strength: Due to pain;Due to precautions Left Hip Flexion: 4-/5 Left Hip Extension: 4-/5 Left Knee Flexion: 4-/5 Left Knee Extension: 4-/5 Left Ankle Dorsiflexion: 3+/5 Left Ankle Plantar Flexion: 4-/5   Lorie Phenix 11/28/2021, 4:23 PM

## 2021-11-29 ENCOUNTER — Other Ambulatory Visit (HOSPITAL_COMMUNITY): Payer: Self-pay

## 2021-11-29 DIAGNOSIS — R040 Epistaxis: Secondary | ICD-10-CM

## 2021-11-29 LAB — CBC
HCT: 25.6 % — ABNORMAL LOW (ref 39.0–52.0)
Hemoglobin: 8.3 g/dL — ABNORMAL LOW (ref 13.0–17.0)
MCH: 36.7 pg — ABNORMAL HIGH (ref 26.0–34.0)
MCHC: 32.4 g/dL (ref 30.0–36.0)
MCV: 113.3 fL — ABNORMAL HIGH (ref 80.0–100.0)
Platelets: 81 10*3/uL — ABNORMAL LOW (ref 150–400)
RBC: 2.26 MIL/uL — ABNORMAL LOW (ref 4.22–5.81)
RDW: 27.8 % — ABNORMAL HIGH (ref 11.5–15.5)
WBC: 19.7 10*3/uL — ABNORMAL HIGH (ref 4.0–10.5)
nRBC: 0.4 % — ABNORMAL HIGH (ref 0.0–0.2)

## 2021-11-29 MED ORDER — APIXABAN 5 MG PO TABS
5.0000 mg | ORAL_TABLET | Freq: Two times a day (BID) | ORAL | Status: DC
  Administered 2021-11-30: 5 mg via ORAL
  Filled 2021-11-29: qty 1

## 2021-11-29 NOTE — Plan of Care (Signed)
  Problem: RH Balance Goal: LTG Patient will maintain dynamic sitting balance (PT) Description: LTG:  Patient will maintain dynamic sitting balance with assistance during mobility activities (PT) Outcome: Completed/Met Goal: LTG Patient will maintain dynamic standing balance (PT) Description: LTG:  Patient will maintain dynamic standing balance with assistance during mobility activities (PT) Outcome: Completed/Met   Problem: Sit to Stand Goal: LTG:  Patient will perform sit to stand with assistance level (PT) Description: LTG:  Patient will perform sit to stand with assistance level (PT) Outcome: Completed/Met   Problem: RH Bed Mobility Goal: LTG Patient will perform bed mobility with assist (PT) Description: LTG: Patient will perform bed mobility with assistance, with/without cues (PT). Outcome: Completed/Met   Problem: RH Bed to Chair Transfers Goal: LTG Patient will perform bed/chair transfers w/assist (PT) Description: LTG: Patient will perform bed to chair transfers with assistance (PT). Outcome: Completed/Met   Problem: RH Car Transfers Goal: LTG Patient will perform car transfers with assist (PT) Description: LTG: Patient will perform car transfers with assistance (PT). Outcome: Completed/Met   Problem: RH Furniture Transfers Goal: LTG Patient will perform furniture transfers w/assist (OT/PT) Description: LTG: Patient will perform furniture transfers  with assistance (OT/PT). Outcome: Completed/Met   Problem: RH Wheelchair Mobility Goal: LTG Patient will propel w/c in controlled environment (PT) Description: LTG: Patient will propel wheelchair in controlled environment, # of feet with assist (PT) Outcome: Completed/Met Goal: LTG Patient will propel w/c in home environment (PT) Description: LTG: Patient will propel wheelchair in home environment, # of feet with assistance (PT). Outcome: Completed/Met Goal: LTG Patient will propel w/c in community environment  (PT) Description: LTG: Patient will propel wheelchair in community environment, # of feet with assist (PT) Outcome: Completed/Met   Problem: RH Ambulation Goal: LTG Patient will ambulate in controlled environment (PT) Description: LTG: Patient will ambulate in a controlled environment, # of feet with assistance (PT). Outcome: Not Met (add Reason) Note: Pain in heel and diizness from nose bleed limit increased distance at this time.  Goal: LTG Patient will ambulate in home environment (PT) Description: LTG: Patient will ambulate in home environment, # of feet with assistance (PT). Outcome: Not Met (add Reason) Note: Pain in heel and diizness from nose bleed limit increased distance at this time.  Goal: LTG Patient will ambulate in community environment (PT) Description: LTG: Patient will ambulate in community environment, # of feet with assistance (PT). Outcome: Not Met (add Reason) Note: Pain in heel and diizness from nose bleed limit increased distance at this time.

## 2021-11-29 NOTE — Consult Note (Signed)
Reason for Consult: Epistaxis Referring Physician: Charlett Blake, MD  Justin [name] is an 66 y.o. male.  HPI: Patient with known thrombocytopenia, atrial fibrillation, on anticoagulation therapy, was due to be discharged today from rehab, started having epistaxis that began yesterday.  He had nasal packing placed on the left side earlier today but continued to have some oozing from the opposite side.  He has had problems before with epistaxis but he does not recall details.  He is not on any nasal spray.  Past Medical History:  Diagnosis Date   Acute meniscal tear of knee LEFT   Anemia    Aortic stenosis 12/01/2017   NONRHEUMATIC, AORTIC VALVE CALCIFICATIONS, MILD TO MODERATE REGURG, MILD TO MODERATE CALCIFIED ANNULUS per ECHO 10/25/17 @ [facility] [address]   Arthritis    Atrial fibrillation ([place]) 11/12/2017   AT O/V WITH PCP   Cancer ([place])    skin right arm   Coronary artery disease    Dyspnea    GERD (gastroesophageal reflux disease)    Headache    hx of migraines   Heart murmur MILD-- ASYMPTOMATIC   History of kidney stones    Hyperlipidemia    Hypertension    Left knee pain    Mixed diabetic hyperlipidemia associated with type 2 diabetes mellitus ([place]) 11/05/2021   PAD (peripheral artery disease) ([facility])    left leg claudication   Sleep apnea     Past Surgical History:  Procedure Laterality Date   AORTIC VALVE REPLACEMENT N/A 01/03/2018   Procedure: AORTIC VALVE REPLACEMENT (AVR) using 91m Magna Ease Bioprosthesis Aortic Valve;  Surgeon: VIvin Poot MD;  [facility] [facility]  Service: Open Heart Surgery;  Laterality: N/A;   APPENDECTOMY  1998   BIOPSY  08/11/2018   Procedure: BIOPSY;  Surgeon: NMauri Pole MD;  [facility]: [facility]  Service: Endoscopy;;   BIOPSY  08/20/2021   Procedure: BIOPSY;  Surgeon: MIrving Copas, MD;  [place] [facility]  Service: Gastroenterology;;   CATARACT EXTRACTION W/ INTRAOCULAR LENS  IMPLANT, BILATERAL   1998/  2000   CERVICAL FUSION  1985   C4 - 5   COLONOSCOPY N/A 08/21/2021   Procedure: COLONOSCOPY;  Surgeon: CDaryel November MD;  [place] [facility]  Service: Gastroenterology;  Laterality: N/A;   CORONARY ARTERY BYPASS GRAFT N/A 01/03/2018   Procedure: CORONARY ARTERY BYPASS GRAFTING (CABG) x 4 (LIMA to LAD, SVG to DIAGONAL, SVG to RAMUS INTERMEDIATE, and SVG to PDA), USING LEFT INFTERNAL MAMMARY ARTERY AND;  Surgeon: VPrescott Gum PCollier Salina MD;  [place] [facility]  Service: Open Heart Surgery;  Laterality: N/A;   ENDARTERECTOMY Right 01/03/2018   Procedure: ENDARTERECTOMY CAROTID;  Surgeon: CMarty Heck MD;  [facility]  Service: Vascular;  Laterality: Right;   ESOPHAGOGASTRODUODENOSCOPY (EGD) WITH PROPOFOL N/A 08/11/2018   Procedure: ESOPHAGOGASTRODUODENOSCOPY (EGD) WITH PROPOFOL;  Surgeon: NMauri Pole MD;  [place] [facility]  Service: Endoscopy;  Laterality: N/A;   ESOPHAGOGASTRODUODENOSCOPY (EGD) WITH PROPOFOL N/A 08/20/2021   Procedure: ESOPHAGOGASTRODUODENOSCOPY (EGD) WITH PROPOFOL;  Surgeon: MRush LandmarkGTelford Nab, MD;  [place] [facility]  Service: Gastroenterology;  Laterality: N/A;   HOT HEMOSTASIS N/A 08/20/2021   Procedure: HOT HEMOSTASIS (ARGON PLASMA COAGULATION/BICAP);  Surgeon: MIrving Copas, MD;  [place] [facility]  Service: Gastroenterology;  Laterality: N/A;   HOT HEMOSTASIS N/A 08/21/2021   Procedure: HOT HEMOSTASIS (ARGON PLASMA COAGULATION/BICAP);  Surgeon: CDaryel November MD;  [place] [facility]  Service: Gastroenterology;  Laterality: N/A;   KNEE  ARTHROSCOPY W/ MENISCECTOMY  1991  1987   LEFT KNEE   x 2   NASAL SINUS SURGERY  1982   ORIF ANKLE FRACTURE Right 10/07/2019   Procedure: OPEN REDUCTION INTERNAL FIXATION (ORIF) ANKLE FRACTURE;  Surgeon: Hiram Gash, MD;  Location: [place];  Service: Orthopedics;  Laterality: Right;  Regional RIGHT  ankle block as well.   RIGHT/LEFT HEART CATH AND CORONARY ANGIOGRAPHY N/A  12/24/2017   Procedure: RIGHT/LEFT HEART CATH AND CORONARY ANGIOGRAPHY;  Surgeon: Jolaine Artist, MD;  [facility];  Service: Cardiovascular;  Laterality: N/A;   ROTATOR CUFF REPAIR  09-04-2005   LEFT SHOULDER   TEE WITHOUT CARDIOVERSION N/A 12/24/2017   Procedure: TRANSESOPHAGEAL ECHOCARDIOGRAM (TEE);  Surgeon: Jolaine Artist, MD;  [facility];  Service: Cardiovascular;  Laterality: N/A;   TEE WITHOUT CARDIOVERSION N/A 01/03/2018   Procedure: TRANSESOPHAGEAL ECHOCARDIOGRAM (TEE);  Surgeon: Prescott Gum, Collier Salina, MD;  Location: [place];  Service: Open Heart Surgery;  Laterality: N/A;   TONSILLECTOMY  AGE 5   TOTAL HIP ARTHROPLASTY Right 12/23/2018   Procedure: TOTAL HIP ARTHROPLASTY ANTERIOR APPROACH;  Surgeon: Paralee Cancel, MD;  [facility]: [facility];  Service: Orthopedics;  Laterality: Right;  70 mins   TOTAL HIP ARTHROPLASTY Left 11/04/2021   Procedure: TOTAL HIP ARTHROPLASTY ANTERIOR APPROACH;  Surgeon: Paralee Cancel, MD;  Location: [facility];  Service: Orthopedics;  Laterality: Left;    Family History  Adopted: Yes  Problem Relation Age of Onset   Bladder Cancer Father        adopted father    Social History:  reports that he quit smoking about 3 years ago. His smoking use included cigars and cigarettes. He has a 5.75 pack-year smoking history. He has never used smokeless tobacco. He reports that he does not currently use alcohol after a past usage of about 2.0 standard drinks of alcohol per week. He reports that he does not use drugs.  Allergies:  Allergies  Allergen Reactions   Codeine Swelling and Other (See Comments)    Tolerates hydrocodone, tramadol, and oxycodone    Dilaudid [Hydromorphone] Anxiety    Hallucinations    Doxycycline Itching and Rash    Medications: Reviewed  Results for orders placed or performed during the hospital encounter of 11/19/21 (from the past 48 hour(s))  CBC     Status: Abnormal   Collection Time: 11/29/21  5:16 AM   Result Value Ref Range   WBC 19.7 (H) 4.0 - 10.5 K/uL   RBC 2.26 (L) 4.22 - 5.81 MIL/uL   Hemoglobin 8.3 (L) 13.0 - 17.0 g/dL   HCT 25.6 (L) 39.0 - 52.0 %   MCV 113.3 (H) 80.0 - 100.0 fL   MCH 36.7 (H) 26.0 - 34.0 pg   MCHC 32.4 30.0 - 36.0 g/dL   RDW 27.8 (H) 11.5 - 15.5 %   Platelets 81 (L) 150 - 400 K/uL    Comment: Immature Platelet Fraction may be clinically indicated, consider ordering this additional test [id number] REPEATED TO VERIFY PLATELET COUNT CONFIRMED BY SMEAR    nRBC 0.4 (H) 0.0 - 0.2 %    Comment: Performed at [facility], [address], [place], [place] [address]    No results found.  YTK:[id number] except as listed in admit H&P  Blood pressure 127/71, pulse 84, temperature 97.8 F (36.6 C), resp. rate 18, height '5\' 7"'$  (1.702 m), weight 79.1 kg, SpO2 99 %.  PHYSICAL EXAM: Overall appearance: Elderly gentleman, in no distress  Head:  Normocephalic, atraumatic. Ears: External ears look healthy. Nose: External nose is healthy in appearance.  Packing in the left side, removed. Oral Cavity/Pharynx:  There are no mucosal lesions or masses identified. Larynx/Hypopharynx: Deferred Neuro:  No identifiable neurologic deficits. Neck: No palpable neck masses.  Studies Reviewed: none  Procedures: Treatment of epistaxis using flexible nasal endoscopy.  The packing was removed from the left side.  Both sides were sprayed with Afrin/Xylocaine spray.  This was also applied on cotton pledgets.  Thorough inspection of the nasal cavities was performed.  There was fresh and old blood in the left side that was suctioned out.  The right side was clear.  Flexible fiberoptic scope was also used.  There was a bleeding site identified along the anterior superior attachment of the left middle turbinate.  This was cauterized with silver nitrate.  Inspection of the remainder of the nasal cavity did not reveal any additional bleeding site.  Fibrillar collagen was applied to the area.   He tolerated this well.  Blood and clot was suctioned out of the pharynx.  There was no further bleeding.   Assessment/Plan:  Epistaxis, treated with silver nitrate cautery.  Monitor overnight.  If no bleeding by tomorrow may be discharged.  If there is further bleeding then I will be contacted and resume treatment.  Follow-up as an outpatient if he has any more difficulty.  Hold anticoagulation for 5 days.   D47.3 Z79.01 R04.0 I48.91  Medical Decision Making: #/Complex Problems: 4  Data Reviewed:1  Management:4 (1-Straightforward, 2-Low, 3-Moderate, 4-High)   Izora Gala 11/29/2021, 11:38 AM

## 2021-11-29 NOTE — Progress Notes (Signed)
PROGRESS NOTE   Subjective/Complaints:  Epistaxis left nares started yesterday .  Pt denies scratching nostril, no trauma.  Eliquis held last noc  this am   Urine in foley bag clear  ROS: neg CP, SOB, N/V/D , Abdominal pain, HA, Cough, abdominal pain, constipation   Objective:   No results found. Recent Labs    11/29/21 0516  WBC 19.7*  HGB 8.3*  HCT 25.6*  PLT 81*    No results for input(s): "NA", "K", "CL", "CO2", "GLUCOSE", "BUN", "CREATININE", "CALCIUM" in the last 72 hours.   Intake/Output Summary (Last 24 hours) at 11/29/2021 0724 Last data filed at 11/29/2021 0416 Gross per 24 hour  Intake 842 ml  Output 3000 ml  Net -2158 ml      Pressure Injury 11/17/21 Heel Left Unstageable - Full thickness tissue loss in which the base of the injury is covered by slough (yellow, tan, gray, green or brown) and/or eschar (tan, brown or black) in the wound bed. pressure injury (Active)  11/17/21 0612  Location: Heel  Location Orientation: Left  Staging: Unstageable - Full thickness tissue loss in which the base of the injury is covered by slough (yellow, tan, gray, green or brown) and/or eschar (tan, brown or black) in the wound bed.  Wound Description (Comments): pressure injury  Present on Admission: Yes (present on transfer to 4E/Urology)     Pressure Injury 11/19/21 Coccyx Mid;Lower Stage 2 -  Partial thickness loss of dermis presenting as a shallow open injury with a red, pink wound bed without slough. (Active)  11/19/21 1625  Location: Coccyx  Location Orientation: Mid;Lower  Staging: Stage 2 -  Partial thickness loss of dermis presenting as a shallow open injury with a red, pink wound bed without slough.  Wound Description (Comments):   Present on Admission: Yes     Pressure Injury 11/19/21 Sacrum Left Stage 2 -  Partial thickness loss of dermis presenting as a shallow open injury with a red, pink wound bed  without slough. (Active)  11/19/21 1625  Location: Sacrum  Location Orientation: Left  Staging: Stage 2 -  Partial thickness loss of dermis presenting as a shallow open injury with a red, pink wound bed without slough.  Wound Description (Comments):   Present on Admission: Yes     Pressure Injury 11/19/21 Sacrum Right Stage 2 -  Partial thickness loss of dermis presenting as a shallow open injury with a red, pink wound bed without slough. (Active)  11/19/21 1625  Location: Sacrum  Location Orientation: Right  Staging: Stage 2 -  Partial thickness loss of dermis presenting as a shallow open injury with a red, pink wound bed without slough.  Wound Description (Comments):   Present on Admission: Yes    Physical Exam: Vital Signs Blood pressure 127/71, pulse 84, temperature 97.8 F (36.6 C), resp. rate 18, height '5\' 7"'$  (1.702 m), weight 79.1 kg, SpO2 99 %.    General: No acute distress, seen at bedside Mood and affect are appropriate, pleasant  Heart: Regular rate and rhythm no rubs murmurs or extra sounds Lungs: Clear to auscultation, breathing unlabored, no rales or wheezes, good air movement, normal rate, no increased  WOB Abdomen: Positive bowel sounds, soft nontender to palpation, nondistended Extremities: No clubbing, cyanosis, or edema   Skin: sacral, coccyx, heel issues as above Neurologic: A & O x 3 , motor strength is 5/5 in bilateral deltoid, bicep, tricep, grip, 5/5 Right and 4/5 Left hip flexor, knee extensors,5/5 Bilateral ankle dorsiflexor and plantar flexor, decreased memory   Musculoskeletal: Full range of motion in all 4 extremities. No joint swelling   Assessment/Plan: 1. Functional deficits which require 3+ hours per day of interdisciplinary therapy in a comprehensive inpatient rehab setting. Physiatrist is providing close team supervision and 24 hour management of active medical problems listed below. Physiatrist and rehab team continue to assess barriers to  discharge/monitor patient progress toward functional and medical goals  Care Tool:  Bathing    Body parts bathed by patient: Right arm, Left arm, Chest, Abdomen, Front perineal area, Buttocks, Right upper leg, Left upper leg, Face, Right lower leg, Left lower leg   Body parts bathed by helper: Right lower leg, Left lower leg     Bathing assist Assist Level: Supervision/Verbal cueing     Upper Body Dressing/Undressing Upper body dressing   What is the patient wearing?: Pull over shirt    Upper body assist Assist Level: Independent    Lower Body Dressing/Undressing Lower body dressing      What is the patient wearing?: Pants     Lower body assist Assist for lower body dressing: Set up assist     Toileting Toileting    Toileting assist Assist for toileting: Supervision/Verbal cueing     Transfers Chair/bed transfer  Transfers assist     Chair/bed transfer assist level: Independent with assistive device Chair/bed transfer assistive device: Programmer, multimedia   Ambulation assist      Assist level: Independent with assistive device Assistive device: Walker-rolling Max distance: 25   Walk 10 feet activity   Assist     Assist level: Independent with assistive device Assistive device: Walker-rolling   Walk 50 feet activity   Assist    Assist level: Supervision/Verbal cueing Assistive device: Walker-rolling    Walk 150 feet activity   Assist Walk 150 feet activity did not occur: Safety/medical concerns         Walk 10 feet on uneven surface  activity   Assist Walk 10 feet on uneven surfaces activity did not occur: Safety/medical concerns         Wheelchair     Assist Is the patient using a wheelchair?: Yes Type of Wheelchair: Manual    Wheelchair assist level: Supervision/Verbal cueing Max wheelchair distance: 150    Wheelchair 50 feet with 2 turns activity    Assist        Assist Level:  Supervision/Verbal cueing   Wheelchair 150 feet activity     Assist      Assist Level: Supervision/Verbal cueing   Blood pressure 127/71, pulse 84, temperature 97.8 F (36.6 C), resp. rate 18, height '5\' 7"'$  (1.702 m), weight 79.1 kg, SpO2 99 %.  Medical Problem List and Plan: 1. Functional deficits secondary to Left total hip replacement anterior approach 56/21/3086 complicated by acute metabolic encephalopathy.  Weightbearing as tolerated..             -patient may shower             -ELOS/Goals: est discharge 11/29/21, mod A for memory -Continue CIR PT, OT, SLP -Plan for Discharge home tomorrow 11/12 if no more Epistaxis  2.  Antithrombotics: -  DVT/anticoagulation:  Pharmaceutical: Eliquis on hold for Epistaxis discussed increased stroke risk with pt              -antiplatelet therapy: N/A 3. Postoperative pain: continue Robaxin and oxycodone as needed 4. Mood/Behavior/Sleep: Provide emotional support             -antipsychotic agents: N/A 5. Neuropsych/cognition: This patient is capable of making decisions on his own behalf. 6.  Left heel pressure ulcer.  Continue Prevalon boot.  Paint left heel wound with Betadine daily, allowed to air dry.  Routine skin checks 7. Fluids/Electrolytes/Nutrition: Routine in and outs with follow-up chemistries 8.  Acute blood loss anemia with history of essential thrombocytopenia/coagulopathy/leukocytosis.  Maintained on Hydrea. follow-up hematology services.    Latest Ref Rng & Units 11/29/2021    5:16 AM 11/20/2021    6:08 AM 11/19/2021    4:58 AM  CBC  WBC 4.0 - 10.5 K/uL 19.7  31.2  32.2   Hemoglobin 13.0 - 17.0 g/dL 8.3  9.6  10.2   Hematocrit 39.0 - 52.0 % 25.6  30.1  32.6   Platelets 150 - 400 K/uL 81  174  185     Hgb and plt are down vs prior  9.  Aortic stenosis with aortic valve replacement/CABG 01/03/2018.  Continue home Eliquis 10.  Atrial fibrillation.  Cardiac rate controlled.  Continue Lopressor 25 mg twice daily.  Continue  Eliquis Vitals:   11/29/21 0336 11/29/21 0415  BP: 114/62 127/71  Pulse: 77 84  Resp: 15 18  Temp: 97.6 F (36.4 C) 97.8 F (36.6 C)  SpO2: 94% 99%  Was on prn lasix at home for leg swelling along with KCL, BPs are soft  -11/10 HR controlled, continue lopressor 11.  Dural venous sinus thrombosis.  Confirmed by CT venogram 10/23.  Patient has been transitioned back to Eliquis 12.  Acute metabolic encephalopathy.  EEG negative.  Ammonia levels within normal limits.  CT MRI unremarkable. 13.  Acute on chronic systolic congestive heart failure.  Monitor for any signs of fluid overload Filed Weights   11/27/21 0445 11/28/21 0315 11/29/21 0500  Weight: 72.3 kg 74.5 kg 79.1 kg   ? Accuracy of todays reading no SOB or increased swelling , has been on Lasix ,BMET in am  14.  BPH/urinary retention.  Flomax has been resumed 0.4 mg daily.  Foley catheter tube removed 11/11/2021 patient noted to have failed voiding trial Foley tube reinserted 11/12/2021 and would remain in place.DO NOT REMOVE FOLEY.  Plan follow-up outpatient with Dr. Louis Meckel urology services -11/9 Foley draining clear yellow urine -Will continue flomax until until urology f/u  15.  Hyperlipidemia.  Crestor 16.  GERD.  Protonix  17.  Mild cellulitis L heel- Keflex completed  18. Moderate malnutrition  -11/10 RD following, eating 25-100% of meals, continue multivitamins, continue to encourage PO intake  LOS: 10 days A FACE TO FACE EVALUATION WAS PERFORMED  Charlett Blake 11/29/2021, 7:24 AM

## 2021-11-29 NOTE — Progress Notes (Signed)
Pt reported sob. Assessed and noted pt had left nasal passage blocked with tissue paper to aid bleeding. Pt saturating at 95% RA. Pt requested Oxygen. Put pt on 2 liters via nasal cannula and saturating at 98%. Will continue to monitor for acute changes. Charge nurse Roselyn Reef R.N. aware.

## 2021-11-29 NOTE — Progress Notes (Addendum)
RHino rocket inserted into Left nares after soaking in sterile water.  Fllled with 68m of air, secured  Pt able to breath without distress post procedure.  [facility] , no [facility] back yet

## 2021-11-30 DIAGNOSIS — G9341 Metabolic encephalopathy: Secondary | ICD-10-CM | POA: Diagnosis not present

## 2021-11-30 DIAGNOSIS — R338 Other retention of urine: Secondary | ICD-10-CM | POA: Diagnosis not present

## 2021-11-30 DIAGNOSIS — K219 Gastro-esophageal reflux disease without esophagitis: Secondary | ICD-10-CM | POA: Diagnosis not present

## 2021-11-30 DIAGNOSIS — Z9181 History of falling: Secondary | ICD-10-CM | POA: Diagnosis not present

## 2021-11-30 DIAGNOSIS — G08 Intracranial and intraspinal phlebitis and thrombophlebitis: Secondary | ICD-10-CM | POA: Diagnosis not present

## 2021-11-30 DIAGNOSIS — I2510 Atherosclerotic heart disease of native coronary artery without angina pectoris: Secondary | ICD-10-CM | POA: Diagnosis not present

## 2021-11-30 DIAGNOSIS — I5023 Acute on chronic systolic (congestive) heart failure: Secondary | ICD-10-CM | POA: Diagnosis not present

## 2021-11-30 DIAGNOSIS — I110 Hypertensive heart disease with heart failure: Secondary | ICD-10-CM | POA: Diagnosis not present

## 2021-11-30 DIAGNOSIS — I4891 Unspecified atrial fibrillation: Secondary | ICD-10-CM | POA: Diagnosis not present

## 2021-11-30 DIAGNOSIS — E782 Mixed hyperlipidemia: Secondary | ICD-10-CM | POA: Diagnosis not present

## 2021-11-30 DIAGNOSIS — L89622 Pressure ulcer of left heel, stage 2: Secondary | ICD-10-CM | POA: Diagnosis not present

## 2021-11-30 DIAGNOSIS — E1169 Type 2 diabetes mellitus with other specified complication: Secondary | ICD-10-CM | POA: Diagnosis not present

## 2021-11-30 DIAGNOSIS — N401 Enlarged prostate with lower urinary tract symptoms: Secondary | ICD-10-CM | POA: Diagnosis not present

## 2021-11-30 DIAGNOSIS — Z96643 Presence of artificial hip joint, bilateral: Secondary | ICD-10-CM | POA: Diagnosis not present

## 2021-11-30 DIAGNOSIS — E440 Moderate protein-calorie malnutrition: Secondary | ICD-10-CM | POA: Diagnosis not present

## 2021-11-30 DIAGNOSIS — D693 Immune thrombocytopenic purpura: Secondary | ICD-10-CM | POA: Diagnosis not present

## 2021-11-30 LAB — CBC
HCT: 27.1 % — ABNORMAL LOW (ref 39.0–52.0)
Hemoglobin: 9.1 g/dL — ABNORMAL LOW (ref 13.0–17.0)
MCH: 37.9 pg — ABNORMAL HIGH (ref 26.0–34.0)
MCHC: 33.6 g/dL (ref 30.0–36.0)
MCV: 112.9 fL — ABNORMAL HIGH (ref 80.0–100.0)
Platelets: 84 10*3/uL — ABNORMAL LOW (ref 150–400)
RBC: 2.4 MIL/uL — ABNORMAL LOW (ref 4.22–5.81)
RDW: 27.9 % — ABNORMAL HIGH (ref 11.5–15.5)
WBC: 18.8 10*3/uL — ABNORMAL HIGH (ref 4.0–10.5)
nRBC: 0.5 % — ABNORMAL HIGH (ref 0.0–0.2)

## 2021-11-30 LAB — BASIC METABOLIC PANEL
Anion gap: 9 (ref 5–15)
BUN: 14 mg/dL (ref 8–23)
CO2: 24 mmol/L (ref 22–32)
Calcium: 8.7 mg/dL — ABNORMAL LOW (ref 8.9–10.3)
Chloride: 97 mmol/L — ABNORMAL LOW (ref 98–111)
Creatinine, Ser: 0.81 mg/dL (ref 0.61–1.24)
GFR, Estimated: >60 mL/min (ref >60)
Glucose, Bld: 108 mg/dL — ABNORMAL HIGH (ref 70–99)
Potassium: 4.1 mmol/L (ref 3.5–5.1)
Sodium: 130 mmol/L — ABNORMAL LOW (ref 135–145)

## 2021-11-30 NOTE — Progress Notes (Signed)
PROGRESS NOTE   Subjective/Complaints:  S/p cauterization of nasal mucosa by ENT yesterday, Rhino rocket removed,pt without Epistaxis overnite per RN   ROS: neg CP, SOB, N/V/D , Abdominal pain, HA, Cough, abdominal pain, constipation   Objective:   No results found. Recent Labs    11/29/21 0516 11/30/21 0727  WBC 19.7* 18.8*  HGB 8.3* 9.1*  HCT 25.6* 27.1*  PLT 81* 84*    No results for input(s): "NA", "K", "CL", "CO2", "GLUCOSE", "BUN", "CREATININE", "CALCIUM" in the last 72 hours.   Intake/Output Summary (Last 24 hours) at 11/30/2021 0753 Last data filed at 11/30/2021 0116 Gross per 24 hour  Intake 840 ml  Output --  Net 840 ml      Pressure Injury 11/17/21 Heel Left Unstageable - Full thickness tissue loss in which the base of the injury is covered by slough (yellow, tan, gray, green or brown) and/or eschar (tan, brown or black) in the wound bed. pressure injury (Active)  11/17/21 0612  Location: Heel  Location Orientation: Left  Staging: Unstageable - Full thickness tissue loss in which the base of the injury is covered by slough (yellow, tan, gray, green or brown) and/or eschar (tan, brown or black) in the wound bed.  Wound Description (Comments): pressure injury  Present on Admission: Yes (present on transfer to 4E/Urology)     Pressure Injury 11/19/21 Coccyx Mid;Lower Stage 2 -  Partial thickness loss of dermis presenting as a shallow open injury with a red, pink wound bed without slough. (Active)  11/19/21 1625  Location: Coccyx  Location Orientation: Mid;Lower  Staging: Stage 2 -  Partial thickness loss of dermis presenting as a shallow open injury with a red, pink wound bed without slough.  Wound Description (Comments):   Present on Admission: Yes     Pressure Injury 11/19/21 Sacrum Left Stage 2 -  Partial thickness loss of dermis presenting as a shallow open injury with a red, pink wound bed without  slough. (Active)  11/19/21 1625  Location: Sacrum  Location Orientation: Left  Staging: Stage 2 -  Partial thickness loss of dermis presenting as a shallow open injury with a red, pink wound bed without slough.  Wound Description (Comments):   Present on Admission: Yes     Pressure Injury 11/19/21 Sacrum Right Stage 2 -  Partial thickness loss of dermis presenting as a shallow open injury with a red, pink wound bed without slough. (Active)  11/19/21 1625  Location: Sacrum  Location Orientation: Right  Staging: Stage 2 -  Partial thickness loss of dermis presenting as a shallow open injury with a red, pink wound bed without slough.  Wound Description (Comments):   Present on Admission: Yes    Physical Exam: Vital Signs Blood pressure 92/62, pulse 86, temperature 97.7 F (36.5 C), temperature source Oral, resp. rate 15, height '5\' 7"'$  (1.702 m), weight 69.8 kg, SpO2 91 %.   ENT  General: No acute distress, seen at bedside Mood and affect are appropriate, pleasant  Heart: Regular rate and rhythm no rubs murmurs or extra sounds Lungs: Clear to auscultation, breathing unlabored, no rales or wheezes, good air movement, normal rate, no increased WOB Abdomen:  Positive bowel sounds, soft nontender to palpation, nondistended Extremities: No clubbing, cyanosis, or edema   Skin: sacral, coccyx, heel issues as above Neurologic: A & O x 3 , motor strength is 5/5 in bilateral deltoid, bicep, tricep, grip, 5/5 Right and 4/5 Left hip flexor, knee extensors,5/5 Bilateral ankle dorsiflexor and plantar flexor, decreased memory   Musculoskeletal: Full range of motion in all 4 extremities. No joint swelling   Assessment/Plan: 1. Functional deficits due to  CVST Stable for D/C today F/u PCP in 3-4 weeks F/u PM&R 2 weeks F/u [facility] for urinary retention  F/u ENT Dr Constance Holster prn nosebleed  See D/C summary See D/C instructions   Care Tool:  Bathing    Body parts bathed by patient:  Right arm, Left arm, Chest, Abdomen, Front perineal area, Buttocks, Right upper leg, Left upper leg, Face, Right lower leg, Left lower leg   Body parts bathed by helper: Right lower leg, Left lower leg     Bathing assist Assist Level: Supervision/Verbal cueing     Upper Body Dressing/Undressing Upper body dressing   What is the patient wearing?: Pull over shirt    Upper body assist Assist Level: Independent    Lower Body Dressing/Undressing Lower body dressing      What is the patient wearing?: Pants     Lower body assist Assist for lower body dressing: Set up assist     Toileting Toileting    Toileting assist Assist for toileting: Supervision/Verbal cueing     Transfers Chair/bed transfer  Transfers assist     Chair/bed transfer assist level: Independent with assistive device Chair/bed transfer assistive device: Programmer, multimedia   Ambulation assist      Assist level: Independent with assistive device Assistive device: Walker-rolling Max distance: 25   Walk 10 feet activity   Assist     Assist level: Independent with assistive device Assistive device: Walker-rolling   Walk 50 feet activity   Assist    Assist level: Supervision/Verbal cueing Assistive device: Walker-rolling    Walk 150 feet activity   Assist Walk 150 feet activity did not occur: Safety/medical concerns         Walk 10 feet on uneven surface  activity   Assist Walk 10 feet on uneven surfaces activity did not occur: Safety/medical concerns         Wheelchair     Assist Is the patient using a wheelchair?: Yes Type of Wheelchair: Manual    Wheelchair assist level: Supervision/Verbal cueing Max wheelchair distance: 150    Wheelchair 50 feet with 2 turns activity    Assist        Assist Level: Supervision/Verbal cueing   Wheelchair 150 feet activity     Assist      Assist Level: Supervision/Verbal cueing   Blood pressure  92/62, pulse 86, temperature 97.7 F (36.5 C), temperature source Oral, resp. rate 15, height '5\' 7"'$  (1.702 m), weight 69.8 kg, SpO2 91 %.  Medical Problem List and Plan: 1. Functional deficits secondary to Left total hip replacement anterior approach 16/10/9602 complicated by acute metabolic encephalopathy.  Weightbearing as tolerated..             - -Plan for Discharge home today 2.  Antithrombotics: -DVT/anticoagulation:  Pharmaceutical: Eliquis on hold for Epistaxis discussed increased stroke risk with pt - ideally ENT recommends no Eliquis x 5 d but pt with both AFib and AVR.  Instructed pt to resume in am and call [facility] ENT if  nosebleed starts again             -antiplatelet therapy: N/A 3. Postoperative pain: continue Robaxin and oxycodone as needed 4. Mood/Behavior/Sleep: Provide emotional support             -antipsychotic agents: N/A 5. Neuropsych/cognition: This patient is capable of making decisions on his own behalf. 6.  Left heel pressure ulcer.  Continue Prevalon boot.  Paint left heel wound with Betadine daily, allowed to air dry.  Routine skin checks 7. Fluids/Electrolytes/Nutrition: Routine in and outs with follow-up chemistries 8.  Acute blood loss anemia with history of essential thrombocytopenia/coagulopathy/leukocytosis.  Maintained on Hydrea. follow-up hematology services.    Latest Ref Rng & Units 11/30/2021    7:27 AM 11/29/2021    5:16 AM 11/20/2021    6:08 AM  CBC  WBC 4.0 - 10.5 K/uL 18.8  19.7  31.2   Hemoglobin 13.0 - 17.0 g/dL 9.1  8.3  9.6   Hematocrit 39.0 - 52.0 % 27.1  25.6  30.1   Platelets 150 - 400 K/uL 84  81  174     Hgb and plt are back up  9.  Aortic stenosis with aortic valve replacement/CABG 01/03/2018.  Continue home Eliquis 10.  Atrial fibrillation.  Cardiac rate controlled.  Continue Lopressor 25 mg twice daily.  Continue Eliquis Vitals:   11/29/21 1956 11/30/21 0324  BP: 124/73 92/62  Pulse: 95 86  Resp: 16 15  Temp: 98.5 F (36.9  C) 97.7 F (36.5 C)  SpO2: 97% 91%  Was on prn lasix at home for leg swelling along with KCL, BPs are soft  -11/10 HR controlled, continue lopressor 11.  Dural venous sinus thrombosis.  Confirmed by CT venogram 10/23.  Patient has been transitioned back to Eliquis 12.  Acute metabolic encephalopathy.  EEG negative.  Ammonia levels within normal limits.  CT MRI unremarkable. 13.  Acute on chronic systolic congestive heart failure.  Monitor for any signs of fluid overload Filed Weights   11/28/21 0315 11/29/21 0500 11/30/21 0500  Weight: 74.5 kg 79.1 kg 69.8 kg   ? Accuracy of todays reading no SOB or increased swelling , has been on Lasix ,BMET in am  14.  BPH/urinary retention.  Flomax has been resumed 0.4 mg daily.  Foley catheter tube removed 11/11/2021 patient noted to have failed voiding trial Foley tube reinserted 11/12/2021 and would remain in place.DO NOT REMOVE FOLEY.  Plan follow-up outpatient with Dr. Louis Meckel urology services -11/9 Foley draining clear yellow urine -Will continue flomax until until urology f/u  15.  Hyperlipidemia.  Crestor 16.  GERD.  Protonix  17.  Mild cellulitis L heel- Keflex completed   LOS: 11 days A FACE TO [place] [facility] Kalim Kissel 11/30/2021, 7:53 AM

## 2021-12-08 ENCOUNTER — Telehealth: Payer: Self-pay | Admitting: Physical Medicine & Rehabilitation

## 2021-12-08 ENCOUNTER — Inpatient Hospital Stay: Payer: Medicare Other | Admitting: Hematology & Oncology

## 2021-12-08 ENCOUNTER — Inpatient Hospital Stay: Payer: Medicare Other | Attending: Hematology & Oncology

## 2021-12-08 NOTE — Telephone Encounter (Signed)
Requesting physical therapy verbal order. Please leave message on secure line [phone]  1x 2 wk 2x 3 wk 1x 4 wk

## 2021-12-09 NOTE — Telephone Encounter (Signed)
Approval given

## 2021-12-15 ENCOUNTER — Emergency Department (HOSPITAL_COMMUNITY): Payer: Medicare Other

## 2021-12-15 ENCOUNTER — Encounter (HOSPITAL_COMMUNITY): Payer: Self-pay

## 2021-12-15 ENCOUNTER — Inpatient Hospital Stay (HOSPITAL_COMMUNITY)
Admission: EM | Admit: 2021-12-15 | Discharge: 2021-12-24 | DRG: 698 | Disposition: A | Discharge status: Skilled Nursing Facility | Payer: Medicare Other | Attending: Internal Medicine | Admitting: Internal Medicine

## 2021-12-15 ENCOUNTER — Other Ambulatory Visit: Payer: Self-pay

## 2021-12-15 DIAGNOSIS — N138 Other obstructive and reflux uropathy: Secondary | ICD-10-CM | POA: Diagnosis not present

## 2021-12-15 DIAGNOSIS — R531 Weakness: Principal | ICD-10-CM

## 2021-12-15 DIAGNOSIS — J9601 Acute respiratory failure with hypoxia: Secondary | ICD-10-CM | POA: Diagnosis present

## 2021-12-15 DIAGNOSIS — T68XXXA Hypothermia, initial encounter: Secondary | ICD-10-CM | POA: Diagnosis not present

## 2021-12-15 DIAGNOSIS — I739 Peripheral vascular disease, unspecified: Secondary | ICD-10-CM | POA: Diagnosis present

## 2021-12-15 DIAGNOSIS — I5022 Chronic systolic (congestive) heart failure: Secondary | ICD-10-CM | POA: Diagnosis not present

## 2021-12-15 DIAGNOSIS — R0902 Hypoxemia: Secondary | ICD-10-CM | POA: Diagnosis not present

## 2021-12-15 DIAGNOSIS — K219 Gastro-esophageal reflux disease without esophagitis: Secondary | ICD-10-CM | POA: Diagnosis present

## 2021-12-15 DIAGNOSIS — J189 Pneumonia, unspecified organism: Secondary | ICD-10-CM | POA: Diagnosis not present

## 2021-12-15 DIAGNOSIS — E1169 Type 2 diabetes mellitus with other specified complication: Secondary | ICD-10-CM | POA: Diagnosis present

## 2021-12-15 DIAGNOSIS — R64 Cachexia: Secondary | ICD-10-CM | POA: Diagnosis present

## 2021-12-15 DIAGNOSIS — I2510 Atherosclerotic heart disease of native coronary artery without angina pectoris: Secondary | ICD-10-CM | POA: Diagnosis present

## 2021-12-15 DIAGNOSIS — E782 Mixed hyperlipidemia: Secondary | ICD-10-CM | POA: Diagnosis present

## 2021-12-15 DIAGNOSIS — J9811 Atelectasis: Secondary | ICD-10-CM | POA: Diagnosis not present

## 2021-12-15 DIAGNOSIS — D539 Nutritional anemia, unspecified: Secondary | ICD-10-CM | POA: Diagnosis present

## 2021-12-15 DIAGNOSIS — L89620 Pressure ulcer of left heel, unstageable: Secondary | ICD-10-CM | POA: Diagnosis present

## 2021-12-15 DIAGNOSIS — Z885 Allergy status to narcotic agent status: Secondary | ICD-10-CM

## 2021-12-15 DIAGNOSIS — R5381 Other malaise: Secondary | ICD-10-CM | POA: Diagnosis not present

## 2021-12-15 DIAGNOSIS — S31809A Unspecified open wound of unspecified buttock, initial encounter: Secondary | ICD-10-CM

## 2021-12-15 DIAGNOSIS — I482 Chronic atrial fibrillation, unspecified: Secondary | ICD-10-CM | POA: Diagnosis not present

## 2021-12-15 DIAGNOSIS — B964 Proteus (mirabilis) (morganii) as the cause of diseases classified elsewhere: Secondary | ICD-10-CM | POA: Diagnosis present

## 2021-12-15 DIAGNOSIS — G9341 Metabolic encephalopathy: Secondary | ICD-10-CM | POA: Diagnosis not present

## 2021-12-15 DIAGNOSIS — Y731 Therapeutic (nonsurgical) and rehabilitative gastroenterology and urology devices associated with adverse incidents: Secondary | ICD-10-CM | POA: Diagnosis present

## 2021-12-15 DIAGNOSIS — Z881 Allergy status to other antibiotic agents status: Secondary | ICD-10-CM

## 2021-12-15 DIAGNOSIS — N390 Urinary tract infection, site not specified: Secondary | ICD-10-CM | POA: Diagnosis not present

## 2021-12-15 DIAGNOSIS — G08 Intracranial and intraspinal phlebitis and thrombophlebitis: Secondary | ICD-10-CM | POA: Diagnosis not present

## 2021-12-15 DIAGNOSIS — R4182 Altered mental status, unspecified: Secondary | ICD-10-CM | POA: Diagnosis not present

## 2021-12-15 DIAGNOSIS — Z96642 Presence of left artificial hip joint: Secondary | ICD-10-CM | POA: Diagnosis present

## 2021-12-15 DIAGNOSIS — R4781 Slurred speech: Secondary | ICD-10-CM | POA: Diagnosis present

## 2021-12-15 DIAGNOSIS — L89152 Pressure ulcer of sacral region, stage 2: Secondary | ICD-10-CM | POA: Diagnosis not present

## 2021-12-15 DIAGNOSIS — Y846 Urinary catheterization as the cause of abnormal reaction of the patient, or of later complication, without mention of misadventure at the time of the procedure: Secondary | ICD-10-CM | POA: Diagnosis not present

## 2021-12-15 DIAGNOSIS — Z856 Personal history of leukemia: Secondary | ICD-10-CM

## 2021-12-15 DIAGNOSIS — I10 Essential (primary) hypertension: Secondary | ICD-10-CM | POA: Diagnosis present

## 2021-12-15 DIAGNOSIS — E1151 Type 2 diabetes mellitus with diabetic peripheral angiopathy without gangrene: Secondary | ICD-10-CM | POA: Diagnosis present

## 2021-12-15 DIAGNOSIS — N281 Cyst of kidney, acquired: Secondary | ICD-10-CM | POA: Diagnosis not present

## 2021-12-15 DIAGNOSIS — Z79899 Other long term (current) drug therapy: Secondary | ICD-10-CM

## 2021-12-15 DIAGNOSIS — L8992 Pressure ulcer of unspecified site, stage 2: Secondary | ICD-10-CM | POA: Insufficient documentation

## 2021-12-15 DIAGNOSIS — D693 Immune thrombocytopenic purpura: Secondary | ICD-10-CM | POA: Diagnosis not present

## 2021-12-15 DIAGNOSIS — K8020 Calculus of gallbladder without cholecystitis without obstruction: Secondary | ICD-10-CM | POA: Diagnosis not present

## 2021-12-15 DIAGNOSIS — R590 Localized enlarged lymph nodes: Secondary | ICD-10-CM | POA: Diagnosis not present

## 2021-12-15 DIAGNOSIS — D649 Anemia, unspecified: Secondary | ICD-10-CM | POA: Diagnosis present

## 2021-12-15 DIAGNOSIS — Z952 Presence of prosthetic heart valve: Secondary | ICD-10-CM

## 2021-12-15 DIAGNOSIS — I82C12 Acute embolism and thrombosis of left internal jugular vein: Secondary | ICD-10-CM | POA: Diagnosis not present

## 2021-12-15 DIAGNOSIS — I4891 Unspecified atrial fibrillation: Secondary | ICD-10-CM | POA: Diagnosis not present

## 2021-12-15 DIAGNOSIS — J90 Pleural effusion, not elsewhere classified: Secondary | ICD-10-CM | POA: Diagnosis not present

## 2021-12-15 DIAGNOSIS — G4730 Sleep apnea, unspecified: Secondary | ICD-10-CM | POA: Diagnosis present

## 2021-12-15 DIAGNOSIS — I700 Atherosclerosis of aorta: Secondary | ICD-10-CM | POA: Diagnosis not present

## 2021-12-15 DIAGNOSIS — Z66 Do not resuscitate: Secondary | ICD-10-CM | POA: Diagnosis present

## 2021-12-15 DIAGNOSIS — N3001 Acute cystitis with hematuria: Secondary | ICD-10-CM | POA: Diagnosis not present

## 2021-12-15 DIAGNOSIS — M1990 Unspecified osteoarthritis, unspecified site: Secondary | ICD-10-CM | POA: Diagnosis present

## 2021-12-15 DIAGNOSIS — I959 Hypotension, unspecified: Secondary | ICD-10-CM | POA: Diagnosis not present

## 2021-12-15 DIAGNOSIS — E43 Unspecified severe protein-calorie malnutrition: Secondary | ICD-10-CM | POA: Diagnosis present

## 2021-12-15 DIAGNOSIS — N3000 Acute cystitis without hematuria: Secondary | ICD-10-CM | POA: Diagnosis not present

## 2021-12-15 DIAGNOSIS — E871 Hypo-osmolality and hyponatremia: Secondary | ICD-10-CM | POA: Diagnosis not present

## 2021-12-15 DIAGNOSIS — I110 Hypertensive heart disease with heart failure: Secondary | ICD-10-CM | POA: Diagnosis not present

## 2021-12-15 DIAGNOSIS — D473 Essential (hemorrhagic) thrombocythemia: Secondary | ICD-10-CM | POA: Diagnosis present

## 2021-12-15 DIAGNOSIS — I5023 Acute on chronic systolic (congestive) heart failure: Secondary | ICD-10-CM | POA: Diagnosis not present

## 2021-12-15 DIAGNOSIS — Z8052 Family history of malignant neoplasm of bladder: Secondary | ICD-10-CM

## 2021-12-15 DIAGNOSIS — Z6825 Body mass index (BMI) 25.0-25.9, adult: Secondary | ICD-10-CM

## 2021-12-15 DIAGNOSIS — N401 Enlarged prostate with lower urinary tract symptoms: Secondary | ICD-10-CM | POA: Diagnosis present

## 2021-12-15 DIAGNOSIS — T83511A Infection and inflammatory reaction due to indwelling urethral catheter, initial encounter: Secondary | ICD-10-CM | POA: Diagnosis not present

## 2021-12-15 DIAGNOSIS — S31819A Unspecified open wound of right buttock, initial encounter: Secondary | ICD-10-CM | POA: Diagnosis not present

## 2021-12-15 DIAGNOSIS — L89622 Pressure ulcer of left heel, stage 2: Secondary | ICD-10-CM | POA: Diagnosis not present

## 2021-12-15 DIAGNOSIS — Z7901 Long term (current) use of anticoagulants: Secondary | ICD-10-CM

## 2021-12-15 DIAGNOSIS — Z951 Presence of aortocoronary bypass graft: Secondary | ICD-10-CM

## 2021-12-15 DIAGNOSIS — D3501 Benign neoplasm of right adrenal gland: Secondary | ICD-10-CM | POA: Diagnosis not present

## 2021-12-15 DIAGNOSIS — R410 Disorientation, unspecified: Secondary | ICD-10-CM | POA: Diagnosis not present

## 2021-12-15 LAB — URINALYSIS, ROUTINE W REFLEX MICROSCOPIC
Glucose, UA: NEGATIVE mg/dL
Ketones, ur: NEGATIVE mg/dL
Nitrite: NEGATIVE
Protein, ur: 100 mg/dL — AB
Specific Gravity, Urine: 1.01 (ref 1.005–1.030)
pH: 5.5 (ref 5.0–8.0)

## 2021-12-15 LAB — CBC WITH DIFFERENTIAL/PLATELET
Abs Immature Granulocytes: 1.29 10*3/uL — ABNORMAL HIGH (ref 0.00–0.07)
Basophils Absolute: 0.3 10*3/uL — ABNORMAL HIGH (ref 0.0–0.1)
Basophils Relative: 1 %
Eosinophils Absolute: 0 10*3/uL (ref 0.0–0.5)
Eosinophils Relative: 0 %
HCT: 30.7 % — ABNORMAL LOW (ref 39.0–52.0)
Hemoglobin: 9.9 g/dL — ABNORMAL LOW (ref 13.0–17.0)
Immature Granulocytes: 4 %
Lymphocytes Relative: 2 %
Lymphs Abs: 0.6 10*3/uL — ABNORMAL LOW (ref 0.7–4.0)
MCH: 38.8 pg — ABNORMAL HIGH (ref 26.0–34.0)
MCHC: 32.2 g/dL (ref 30.0–36.0)
MCV: 120.4 fL — ABNORMAL HIGH (ref 80.0–100.0)
Monocytes Absolute: 1.1 10*3/uL — ABNORMAL HIGH (ref 0.1–1.0)
Monocytes Relative: 4 %
Neutro Abs: 25.7 10*3/uL — ABNORMAL HIGH (ref 1.7–7.7)
Neutrophils Relative %: 89 %
Platelets: 306 10*3/uL (ref 150–400)
RBC: 2.55 MIL/uL — ABNORMAL LOW (ref 4.22–5.81)
WBC: 29 10*3/uL — ABNORMAL HIGH (ref 4.0–10.5)
nRBC: 0.6 % — ABNORMAL HIGH (ref 0.0–0.2)

## 2021-12-15 LAB — URINALYSIS, MICROSCOPIC (REFLEX)
RBC / HPF: >50 RBC/hpf (ref 0–5)
Squamous Epithelial / HPF: NONE SEEN (ref 0–5)
WBC, UA: >50 WBC/hpf (ref 0–5)

## 2021-12-15 LAB — COMPREHENSIVE METABOLIC PANEL
ALT: 15 U/L (ref 0–44)
AST: 26 U/L (ref 15–41)
Albumin: 2.5 g/dL — ABNORMAL LOW (ref 3.5–5.0)
Alkaline Phosphatase: 145 U/L — ABNORMAL HIGH (ref 38–126)
Anion gap: 9 (ref 5–15)
BUN: 15 mg/dL (ref 8–23)
CO2: 23 mmol/L (ref 22–32)
Calcium: 8 mg/dL — ABNORMAL LOW (ref 8.9–10.3)
Chloride: 100 mmol/L (ref 98–111)
Creatinine, Ser: 1.01 mg/dL (ref 0.61–1.24)
GFR, Estimated: >60 mL/min (ref >60)
Glucose, Bld: 79 mg/dL (ref 70–99)
Potassium: 3.7 mmol/L (ref 3.5–5.1)
Sodium: 132 mmol/L — ABNORMAL LOW (ref 135–145)
Total Bilirubin: 1.5 mg/dL — ABNORMAL HIGH (ref 0.3–1.2)
Total Protein: 6.3 g/dL — ABNORMAL LOW (ref 6.5–8.1)

## 2021-12-15 LAB — CK: Total CK: 64 U/L (ref 49–397)

## 2021-12-15 LAB — AMMONIA: Ammonia: 31 umol/L (ref 9–35)

## 2021-12-15 LAB — LACTIC ACID, PLASMA
Lactic Acid, Venous: 1.3 mmol/L (ref 0.5–1.9)
Lactic Acid, Venous: 1.1 mmol/L (ref 0.5–1.9)

## 2021-12-15 MED ORDER — SODIUM CHLORIDE 0.9 % IV SOLN
1.0000 g | Freq: Once | INTRAVENOUS | Status: AC
  Administered 2021-12-15: 1 g via INTRAVENOUS
  Filled 2021-12-15: qty 10

## 2021-12-15 MED ORDER — IOHEXOL 350 MG/ML SOLN
75.0000 mL | Freq: Once | INTRAVENOUS | Status: AC | PRN
  Administered 2021-12-15: 75 mL via INTRAVENOUS

## 2021-12-15 MED ORDER — OXYCODONE HCL 5 MG PO TABS
10.0000 mg | ORAL_TABLET | Freq: Once | ORAL | Status: AC
  Administered 2021-12-15: 10 mg via ORAL
  Filled 2021-12-15: qty 2

## 2021-12-15 MED ORDER — HYDROCODONE-ACETAMINOPHEN 5-325 MG PO TABS
2.0000 | ORAL_TABLET | Freq: Once | ORAL | Status: AC
  Administered 2021-12-15: 2 via ORAL
  Filled 2021-12-15: qty 2

## 2021-12-15 NOTE — ED Provider Triage Note (Signed)
Emergency Medicine Provider Triage Evaluation Note  Justin [name] , a 66 y.o. male  was evaluated in triage.  Pt complains of weakness.  Status post total hip arthroplasty a week ago.  He has had an indwelling catheter and there for the entire PICC time.  He has noticed that he has had increased urine cloudiness darkness and feels he has pain in his suprapubic region.  Review of Systems  Positive: Weakness Negative: Fever  Physical Exam  BP 105/74 (BP Location: Right Arm)   Pulse (!) 106   Temp 97.6 F (36.4 C) (Oral)   Resp 18   SpO2 92%  Gen:   Awake, no distress   Resp:  Normal effort  MSK:   Moves extremities without difficulty  Other:  Urine is dark  Medical Decision Making  Medically screening exam initiated at 2:09 PM.  Appropriate orders placed.  Caylin Raby Weathers was informed that the remainder of the evaluation will be completed by another provider, this initial triage assessment does not replace that evaluation, and the importance of remaining in the ED until their evaluation is complete.     Margarita Mail, PA-C 12/15/21 1412

## 2021-12-15 NOTE — ED Notes (Signed)
This RN notified ED Charge of pt lab value; WBC-29, Awaiting room.

## 2021-12-15 NOTE — ED Notes (Signed)
Patient provided with perineal care. Mepiplex placed on sacral pressure sore. Groin area rash cleaned and applied barrier cream. Placed in clean brief with clean linens. Warm blankets provided. Patient repositioned and resting comfortably

## 2021-12-15 NOTE — ED Notes (Signed)
Patient provided with turkey sandwich and gingerale

## 2021-12-15 NOTE — ED Triage Notes (Signed)
Patient presents to ed via [facility] from home states  he was recently released from rehab after hip replacement  2 weeks ago , c/o increased weakness and confusions over the last several days. C/o urine dark in foley bag.

## 2021-12-15 NOTE — H&P (Incomplete)
History and Physical    Justin [name] KNL:[id number] DOB: 1955/03/07 DOA: 12/15/2021  PCP: [name], MD  Patient coming from: Home  Chief Complaint: Altered mental status  HPI: Justin [name] is a 66 y.o. male with medical history significant of essential thrombocythemia, chronic A-fib on Eliquis, dural venous sinus thrombosis, aortic stenosis status post AVR, CAD status post CABG, hypertension, hyperlipidemia, type 2 diabetes, PAD, chronic systolic CHF, BPH, left total hip replacement 11/04/2021 presented to the ED via EMS from home for evaluation of generalized weakness confusion, and slight slurring of speech x 1 week.  Patient noted to have pressure ulcers on his buttocks and left heel.  Slightly tachycardic on arrival to the ED but afebrile.  Labs showing worsening leukocytosis (WBC count 29.0 with left shift, was 18.8 on labs done 2 weeks ago), hemoglobin 9.9 (stable), platelet count 306k, blood cultures drawn, lactic acid normal x 2, ammonia normal.  UA with negative nitrite, moderate amount of leukocytes, and microscopy showing >50 RBCs, >50 WBCs, and few bacteria.  CT head negative for acute finding and CT venogram head showing unchanged venous thrombosis within the left sigmoid sinus that extends into the left internal jugular vein. Patient was given Vicodin, Oxy IR, and ceftriaxone.  TRH called to admit.  ED Course: ***  Review of Systems:  ROS  Past Medical History:  Diagnosis Date  . Acute meniscal tear of knee LEFT  . Anemia   . Aortic stenosis 12/01/2017   NONRHEUMATIC, AORTIC VALVE CALCIFICATIONS, MILD TO MODERATE REGURG, MILD TO MODERATE CALCIFIED ANNULUS per ECHO 10/25/17 @ [facility]  . Arthritis   . Atrial fibrillation ([place]) 11/12/2017   AT O/V WITH PCP  . Cancer ([place])    skin right arm  . Coronary artery disease   . Dyspnea   . GERD (gastroesophageal reflux disease)   . Headache    hx of migraines  . Heart murmur MILD-- ASYMPTOMATIC  .  History of kidney stones   . Hyperlipidemia   . Hypertension   . Left knee pain   . Mixed diabetic hyperlipidemia associated with type 2 diabetes mellitus ([place]) 11/05/2021  . PAD (peripheral artery disease) ([facility])    left leg claudication  . Sleep apnea     Past Surgical History:  Procedure Laterality Date  . AORTIC VALVE REPLACEMENT N/A 01/03/2018   Procedure: AORTIC VALVE REPLACEMENT (AVR) using 29m Magna Ease Bioprosthesis Aortic Valve;  Surgeon: VIvin Poot MD;  [place] [facility]  Service: Open Heart Surgery;  Laterality: N/A;  . APPENDECTOMY  1998  . BIOPSY  08/11/2018   Procedure: BIOPSY;  Surgeon: NMauri Pole MD;  [facility]  Service: Endoscopy;;  . BIOPSY  08/20/2021   Procedure: BIOPSY;  Surgeon: MIrving Copas, MD;  [place] [facility]  Service: Gastroenterology;;  . CATARACT EXTRACTION W/ INTRAOCULAR LENS  IMPLANT, BILATERAL  1998/  2000  . CERVICAL FUSION  1985   C4 - 5  . COLONOSCOPY N/A 08/21/2021   Procedure: COLONOSCOPY;  Surgeon: CDaryel November MD;  [place] [facility]  Service: Gastroenterology;  Laterality: N/A;  . CORONARY ARTERY BYPASS GRAFT N/A 01/03/2018   Procedure: CORONARY ARTERY BYPASS GRAFTING (CABG) x 4 (LIMA to LAD, SVG to DIAGONAL, SVG to RAMUS INTERMEDIATE, and SVG to PDA), USING LEFT INFTERNAL MAMMARY ARTERY AND;  Surgeon: VPrescott Gum PCollier Salina MD;  [place]  Service: Open Heart Surgery;  Laterality: N/A;  . ENDARTERECTOMY Right 01/03/2018   Procedure: ENDARTERECTOMY CAROTID;  Surgeon: Marty Heck, MD;  Location: [facility] OR;  Service: Vascular;  Laterality: Right;  . ESOPHAGOGASTRODUODENOSCOPY (EGD) WITH PROPOFOL N/A 08/11/2018   Procedure: ESOPHAGOGASTRODUODENOSCOPY (EGD) WITH PROPOFOL;  Surgeon: Mauri Pole, MD;  Location: [place];  Service: Endoscopy;  Laterality: N/A;  . ESOPHAGOGASTRODUODENOSCOPY (EGD) WITH PROPOFOL N/A 08/20/2021   Procedure: ESOPHAGOGASTRODUODENOSCOPY (EGD) WITH  PROPOFOL;  Surgeon: Rush Landmark Telford Nab., MD;  Location: [place];  Service: Gastroenterology;  Laterality: N/A;  . HOT HEMOSTASIS N/A 08/20/2021   Procedure: HOT HEMOSTASIS (ARGON PLASMA COAGULATION/BICAP);  Surgeon: Irving Copas., MD;  Location: [place];  Service: Gastroenterology;  Laterality: N/A;  . HOT HEMOSTASIS N/A 08/21/2021   Procedure: HOT HEMOSTASIS (ARGON PLASMA COAGULATION/BICAP);  Surgeon: [name], MD;  Location: [place];  Service: Gastroenterology;  Laterality: N/A;  . KNEE ARTHROSCOPY W/ MENISCECTOMY  1991  1987   LEFT KNEE   x 2  . NASAL SINUS SURGERY  1982  . ORIF ANKLE FRACTURE Right 10/07/2019   Procedure: OPEN REDUCTION INTERNAL FIXATION (ORIF) ANKLE FRACTURE;  Surgeon: Hiram Gash, MD;  Location: [place];  Service: Orthopedics;  Laterality: Right;  Regional RIGHT  ankle block as well.  Marland Kitchen RIGHT/LEFT HEART CATH AND CORONARY ANGIOGRAPHY N/A 12/24/2017   Procedure: RIGHT/LEFT HEART CATH AND CORONARY ANGIOGRAPHY;  Surgeon: Jolaine Artist, MD;  Location: [place] [facility];  Service: Cardiovascular;  Laterality: N/A;  . ROTATOR CUFF REPAIR  09-04-2005   LEFT SHOULDER  . TEE WITHOUT CARDIOVERSION N/A 12/24/2017   Procedure: TRANSESOPHAGEAL ECHOCARDIOGRAM (TEE);  Surgeon: Jolaine Artist, MD;  Location: [facility];  Service: Cardiovascular;  Laterality: N/A;  . TEE WITHOUT CARDIOVERSION N/A 01/03/2018   Procedure: TRANSESOPHAGEAL ECHOCARDIOGRAM (TEE);  Surgeon: Prescott Gum, Collier Salina, MD;  Location: [place];  Service: Open Heart Surgery;  Laterality: N/A;  . TONSILLECTOMY  AGE 52  . TOTAL HIP ARTHROPLASTY Right 12/23/2018   Procedure: TOTAL HIP ARTHROPLASTY ANTERIOR APPROACH;  Surgeon: Paralee Cancel, MD;  Location: [facility];  Service: Orthopedics;  Laterality: Right;  70 mins  . TOTAL HIP ARTHROPLASTY Left 11/04/2021   Procedure: TOTAL HIP ARTHROPLASTY ANTERIOR APPROACH;  Surgeon: Paralee Cancel, MD;  Location: [facility];  Service: Orthopedics;   Laterality: Left;     reports that he quit smoking about 3 years ago. His smoking use included cigars and cigarettes. He has a 5.75 pack-year smoking history. He has never used smokeless tobacco. He reports that he does not currently use alcohol after a past usage of about 2.0 standard drinks of alcohol per week. He reports that he does not use drugs.  Allergies  Allergen Reactions  . Codeine Swelling and Other (See Comments)    Tolerates hydrocodone, tramadol, and oxycodone   . Dilaudid [Hydromorphone] Anxiety    Hallucinations   . Doxycycline Itching and Rash    Family History  Adopted: Yes  Problem Relation Age of Onset  . Bladder Cancer Father        adopted father    Prior to Admission medications   Medication Sig Start Date End Date Taking? Authorizing Provider  albuterol (VENTOLIN HFA) 108 (90 Base) MCG/ACT inhaler Inhale 1 puff into the lungs every 6 (six) hours as needed for shortness of breath. 11/28/21  Yes Angiulli, Lavon Paganini, PA-C  Carboxymethylcellulose Sodium (REFRESH TEARS OP) Place 1-2 drops into both eyes daily as needed (dry eyes).   Yes [provider]  Cholecalciferol 125 MCG (5000 UT) TABS Take 1 tablet (5,000 Units total) by mouth daily.  11/28/21  Yes Angiulli, Lavon Paganini, PA-C  docusate sodium (COLACE) 100 MG capsule Take 100 mg by mouth daily as needed for mild constipation.   Yes [provider]  ELIQUIS 5 MG TABS tablet Take 1 tablet (5 mg total) by mouth 2 (two) times daily. 11/28/21  Yes Angiulli, Lavon Paganini, PA-C  ferrous sulfate 325 (65 FE) MG tablet Take 1 tablet (325 mg total) by mouth 2 (two) times daily with a meal. 11/28/21  Yes Angiulli, Lavon Paganini, PA-C  hydroxyurea (HYDREA) 500 MG capsule TAKE ONE CAPSULE BY MOUTH TWICE A DAY *MAY TAKE WITH FOOD TO MINIMIZE GI SIDE EFFECTS Patient taking differently: Take 500 mg by mouth 2 (two) times daily. MAY TAKE WITH FOOD TO MINIMIZE GI SIDE EFFECTS 11/28/21  Yes Angiulli, Lavon Paganini, PA-C  magnesium  oxide (MAG-OX) 400 MG tablet Take 1 tablet (400 mg total) by mouth daily. 11/28/21  Yes Angiulli, Lavon Paganini, PA-C  methocarbamol (ROBAXIN) 500 MG tablet Take 1 tablet (500 mg total) by mouth every 6 (six) hours as needed for muscle spasms. 11/28/21  Yes Angiulli, Lavon Paganini, PA-C  metoprolol tartrate (LOPRESSOR) 25 MG tablet Take 1 tablet (25 mg total) by mouth 2 (two) times daily. 11/28/21  Yes Angiulli, Lavon Paganini, PA-C  Multiple Vitamins-Minerals (CENTRUM ADULTS) TABS Take 1 capsule by mouth daily.   Yes [provider]  oxyCODONE (OXY IR/ROXICODONE) 5 MG immediate release tablet Take 1 tablet (5 mg total) by mouth every 4 (four) hours as needed for moderate pain. 11/28/21  Yes Angiulli, Lavon Paganini, PA-C  oxymetazoline (AFRIN) 0.05 % nasal spray Place 1 spray into both nostrils daily as needed for congestion.   Yes [provider]  pantoprazole (PROTONIX) 40 MG tablet Take 1 tablet (40 mg total) by mouth daily. Patient taking differently: Take 40 mg by mouth 2 (two) times daily. 11/28/21  Yes Angiulli, Lavon Paganini, PA-C  rosuvastatin (CRESTOR) 20 MG tablet Take 1 tablet (20 mg total) by mouth daily. 11/28/21  Yes Angiulli, Lavon Paganini, PA-C  sucralfate (CARAFATE) 1 g tablet Take 1 tablet (1 g total) by mouth 2 (two) times daily. 11/28/21 12/28/21 Yes Angiulli, Lavon Paganini, PA-C  tadalafil (CIALIS) 20 MG tablet Take 20 mg by mouth daily as needed.   Yes [provider]  tamsulosin (FLOMAX) 0.4 MG CAPS capsule Take 1 capsule (0.4 mg total) by mouth daily. 11/28/21  Yes Angiulli, Lavon Paganini, PA-C  zolpidem (AMBIEN) 10 MG tablet Take 10 mg by mouth at bedtime.   Yes [provider]  acetaminophen (TYLENOL) 325 MG tablet Take 1-2 tablets (325-650 mg total) by mouth every 6 (six) hours as needed for mild pain (pain score 1-3 or temp > 100.5). Patient not taking: Reported on 12/15/2021 11/28/21   Angiulli, Lavon Paganini, PA-C  furosemide (LASIX) 20 MG tablet Take 0.5 tablets (10 mg total) by  mouth daily. Patient taking differently: Take 20 mg by mouth daily as needed for edema. 11/28/21   Angiulli, Lavon Paganini, PA-C  loratadine (CLARITIN) 10 MG tablet Take 1 tablet (10 mg total) by mouth daily. Patient not taking: Reported on 12/15/2021 11/28/21   Angiulli, Lavon Paganini, PA-C  nitroGLYCERIN (NITROSTAT) 0.4 MG SL tablet Place 1 tablet (0.4 mg total) under the tongue every 5 (five) minutes as needed for chest pain. 11/28/21   Angiulli, Lavon Paganini, PA-C  Omega-3 Fatty Acids (FISH OIL PO) Take 1 capsule by mouth daily. Patient not taking: Reported on 12/15/2021    [provider]  polyethylene glycol (MIRALAX / GLYCOLAX) 17 g packet Take 17 g by mouth daily. 11/28/21   Angiulli, Lavon Paganini, PA-C  potassium chloride (KLOR-CON M) 10 MEQ tablet Take 1 tablet (10 mEq total) by mouth daily. Patient taking differently: Take 10 mEq by mouth daily as needed (take when the furosemide is taken). 11/28/21   Cathlyn Parsons, PA-C    Physical Exam: Vitals:   12/15/21 2030 12/15/21 2045 12/15/21 2100 12/15/21 2130  BP: 105/87 109/73 119/66 98/62  Pulse: 95 (!) 103 (!) 101 88  Resp: '19 18 20 '$ (!) 23  Temp:      TempSrc:      SpO2: 99% 90% 96% 93%    Physical Exam  Labs on Admission: I have personally reviewed following labs and imaging studies  CBC: Recent Labs  Lab 12/15/21 1419  WBC 29.0*  NEUTROABS 25.7*  HGB 9.9*  HCT 30.7*  MCV 120.4*  PLT 625   Basic Metabolic Panel: Recent Labs  Lab 12/15/21 1419  NA 132*  K 3.7  CL 100  CO2 23  GLUCOSE 79  BUN 15  CREATININE 1.01  CALCIUM 8.0*   GFR: CrCl cannot be calculated (Unknown ideal weight.). Liver Function Tests: Recent Labs  Lab 12/15/21 1419  AST 26  ALT 15  ALKPHOS 145*  BILITOT 1.5*  PROT 6.3*  ALBUMIN 2.5*   No results for input(s): "LIPASE", "AMYLASE" in the last 168 hours. Recent Labs  Lab 12/15/21 1745  AMMONIA 31   Coagulation Profile: No results for input(s): "INR", "PROTIME" in the last 168  hours. Cardiac Enzymes: Recent Labs  Lab 12/15/21 1419  CKTOTAL 64   BNP (last 3 results) No results for input(s): "PROBNP" in the last 8760 hours. HbA1C: No results for input(s): "HGBA1C" in the last 72 hours. CBG: No results for input(s): "GLUCAP" in the last 168 hours. Lipid Profile: No results for input(s): "CHOL", "HDL", "LDLCALC", "TRIG", "CHOLHDL", "LDLDIRECT" in the last 72 hours. Thyroid Function Tests: No results for input(s): "TSH", "T4TOTAL", "FREET4", "T3FREE", "THYROIDAB" in the last 72 hours. Anemia Panel: No results for input(s): "VITAMINB12", "FOLATE", "FERRITIN", "TIBC", "IRON", "RETICCTPCT" in the last 72 hours. Urine analysis:    Component Value Date/Time   COLORURINE ORANGE (A) 12/15/2021 1950   APPEARANCEUR TURBID (A) 12/15/2021 1950   LABSPEC 1.010 12/15/2021 1950   PHURINE 5.5 12/15/2021 1950   GLUCOSEU NEGATIVE 12/15/2021 1950   HGBUR LARGE (A) 12/15/2021 1950   BILIRUBINUR SMALL (A) 12/15/2021 [place] NEGATIVE 12/15/2021 1950   PROTEINUR 100 (A) 12/15/2021 1950   NITRITE NEGATIVE 12/15/2021 1950   LEUKOCYTESUR MODERATE (A) 12/15/2021 1950    Radiological Exams on Admission: CT VENOGRAM HEAD  Result Date: 12/15/2021 CLINICAL DATA:  History of dural venous sinus thrombosis. Altered mental status. EXAM: CT VENOGRAM HEAD TECHNIQUE: Venographic phase images of the brain were obtained following the administration of intravenous contrast. Multiplanar reformats and maximum intensity projections were generated. RADIATION DOSE REDUCTION: This exam was performed according to the departmental dose-optimization program which includes automated exposure control, adjustment of the mA and/or kV according to patient size and/or use of iterative reconstruction technique. CONTRAST:  64m OMNIPAQUE IOHEXOL 350 MG/ML SOLN COMPARISON:  11/10/2021 FINDINGS: Superior sagittal sinus: Normal. Straight sinus: Normal. Inferior sagittal sinus, vein of Galen and internal  cerebral veins: Normal. Transverse sinuses: Normal. Sigmoid sinuses: Unchanged filling defect within the left sigmoid sinus that extends into the left internal jugular vein. The right sigmoid sinus is normal. Visualized jugular veins: Unchanged  left internal jugular vein filling defect. Right side is normal. IMPRESSION: Unchanged venous thrombosis within the left sigmoid sinus that extends into the left internal jugular vein. Electronically Signed   By: Ulyses Jarred M.D.   On: 12/15/2021 19:54   CT Head Wo Contrast  Result Date: 12/15/2021 CLINICAL DATA:  Altered mental status EXAM: CT HEAD WITHOUT CONTRAST TECHNIQUE: Contiguous axial images were obtained from the base of the skull through the vertex without intravenous contrast. RADIATION DOSE REDUCTION: This exam was performed according to the departmental dose-optimization program which includes automated exposure control, adjustment of the mA and/or kV according to patient size and/or use of iterative reconstruction technique. COMPARISON:  11/10/2021 FINDINGS: Brain: There is no mass, hemorrhage or extra-axial collection. The size and configuration of the ventricles and extra-axial CSF spaces are normal. There is hypoattenuation of the white matter, most commonly indicating chronic small vessel disease. Vascular: Atherosclerotic calcification of the internal carotid arteries at the skull base. No abnormal hyperdensity of the major intracranial arteries or dural venous sinuses. Skull: The visualized skull base, calvarium and extracranial soft tissues are normal. Sinuses/Orbits: No fluid levels or advanced mucosal thickening of the visualized paranasal sinuses. No mastoid or middle ear effusion. The orbits are normal. IMPRESSION: 1. No acute intracranial abnormality. 2. Chronic small vessel disease. Electronically Signed   By: Ulyses Jarred M.D.   On: 12/15/2021 19:50    EKG: Independently reviewed.  Sinus or ectopic atrial rhythm, QTc 491.  Assessment  and Plan  Possible UTI UA with evidence of pyuria and presenting with altered mental status/confusion.  Labs showing worsening leukocytosis but patient is afebrile  essential thrombocythemia, chronic A-fib on Eliquis, dural venous sinus thrombosis, aortic stenosis status post AVR, CAD status post CABG, hypertension, hyperlipidemia, type 2 diabetes, PAD, chronic systolic CHF, BPH, left total hip replacement 11/04/2021 presented to the ED via EMS from home for evaluation of    generalized weakness confusion, and slight slurring of speech x 1 week.  Patient noted to have pressure ulcers on his buttocks and left heel.    Slightly tachycardic on arrival to the ED but afebrile.  Labs showing worsening leukocytosis (WBC count 29.0 with left shift, was 18.8 on labs done 2 weeks ago), hemoglobin 9.9 (stable), platelet count 306k, blood cultures drawn, lactic acid normal x 2, ammonia normal.  UA with negative nitrite, moderate amount of leukocytes, and microscopy showing >50 RBCs, >50 WBCs, and few bacteria.  CT head negative for acute finding and CT venogram head showing unchanged venous thrombosis within the left sigmoid sinus that extends into the left internal jugular vein. Patient was given Vicodin, Oxy IR, and ceftriaxone.  TRH called to admit.  Echo done last month showing EF 45 to 50%.  QT prolongation  DVT prophylaxis: {Blank single:19197::"Lovenox","SQ Heparin","IV heparin gtt","Xarelto","Eliquis","Coumadin","SCDs","***"} Code Status: {Blank single:19197::"Full Code","DNR","DNR/DNI","Comfort Care","***"} Family Communication: ***  Consults called: ***  Level of care: {Blank single:19197::"Med-Surg","Telemetry bed","Progressive Care Unit","Step Down Unit"} Admission status: ***  Shela Leff MD [facility]  If 7PM-7AM, please contact night-coverage [email or web address]  12/15/2021, 11:38 PM

## 2021-12-15 NOTE — ED Notes (Signed)
Patient presents with ulcer to L heel and c.o of pain in heel. Patients heel cleaned and heel floated off bed to relieve pain. Pillow placed under patients L hip to turn patient off of buttocks and relieve pressure at this time

## 2021-12-15 NOTE — ED Notes (Signed)
Pt came in with foley catheter that has tan brown cloudy drainage. Foul smelling. PA Smalls notified and aware.

## 2021-12-15 NOTE — ED Provider Notes (Signed)
[name] Provider Note   CSN: [id number] Arrival date & time: 12/15/21  1223     History  Chief Complaint  Patient presents with   Weakness    [name] is a 66 y.o. male, history of essential thrombocythemia, who presents to the ED secondary to increased confusion, difficulty following commands per wife for the last week.  She states that last Wednesday, he is behavior should be abnormal, and he has been acting more confused.  He has stopped doing his daily activities, and now is bedbound.  Notes that since his total hip replacement about a month ago, his urine has been very dark, and it recently turned cloudy brown.  States that after his hip replacement he was improving, but for the last week he has just gone downhill.  She denies any fevers, shortness of breath, complaints of abdominal pain.  Patient has just not been himself.  She notes that he is now slurring his speech slightly, but does not know if that is from the confusion.  Does not seem to have one-sided weakness on his body.  Of note has developed an ulcer on his buttocks, and then has an ulcer on his left heel.  She states that he sees wound care for this.      Home Medications Prior to Admission medications   Medication Sig Start Date End Date Taking? Authorizing Provider  albuterol (VENTOLIN HFA) 108 (90 Base) MCG/ACT inhaler Inhale 1 puff into the lungs every 6 (six) hours as needed for shortness of breath. 11/28/21  Yes Angiulli, Lavon Paganini, PA-C  Carboxymethylcellulose Sodium (REFRESH TEARS OP) Place 1-2 drops into both eyes daily as needed (dry eyes).   Yes [provider]  Cholecalciferol 125 MCG (5000 UT) TABS Take 1 tablet (5,000 Units total) by mouth daily. 11/28/21  Yes Angiulli, Lavon Paganini, PA-C  docusate sodium (COLACE) 100 MG capsule Take 100 mg by mouth daily as needed for mild constipation.   Yes [provider]  ELIQUIS 5 MG TABS tablet Take  1 tablet (5 mg total) by mouth 2 (two) times daily. 11/28/21  Yes Angiulli, Lavon Paganini, PA-C  ferrous sulfate 325 (65 FE) MG tablet Take 1 tablet (325 mg total) by mouth 2 (two) times daily with a meal. 11/28/21  Yes Angiulli, Lavon Paganini, PA-C  hydroxyurea (HYDREA) 500 MG capsule TAKE ONE CAPSULE BY MOUTH TWICE A DAY *MAY TAKE WITH FOOD TO MINIMIZE GI SIDE EFFECTS Patient taking differently: Take 500 mg by mouth 2 (two) times daily. MAY TAKE WITH FOOD TO MINIMIZE GI SIDE EFFECTS 11/28/21  Yes Angiulli, Lavon Paganini, PA-C  magnesium oxide (MAG-OX) 400 MG tablet Take 1 tablet (400 mg total) by mouth daily. 11/28/21  Yes Angiulli, Lavon Paganini, PA-C  methocarbamol (ROBAXIN) 500 MG tablet Take 1 tablet (500 mg total) by mouth every 6 (six) hours as needed for muscle spasms. 11/28/21  Yes Angiulli, Lavon Paganini, PA-C  metoprolol tartrate (LOPRESSOR) 25 MG tablet Take 1 tablet (25 mg total) by mouth 2 (two) times daily. 11/28/21  Yes Angiulli, Lavon Paganini, PA-C  Multiple Vitamins-Minerals (CENTRUM ADULTS) TABS Take 1 capsule by mouth daily.   Yes [provider]  oxyCODONE (OXY IR/ROXICODONE) 5 MG immediate release tablet Take 1 tablet (5 mg total) by mouth every 4 (four) hours as needed for moderate pain. 11/28/21  Yes Angiulli, Lavon Paganini, PA-C  oxymetazoline (AFRIN) 0.05 % nasal spray Place 1 spray into both nostrils daily as needed for  congestion.   Yes [provider]  pantoprazole (PROTONIX) 40 MG tablet Take 1 tablet (40 mg total) by mouth daily. Patient taking differently: Take 40 mg by mouth 2 (two) times daily. 11/28/21  Yes Angiulli, Lavon Paganini, PA-C  rosuvastatin (CRESTOR) 20 MG tablet Take 1 tablet (20 mg total) by mouth daily. 11/28/21  Yes Angiulli, Lavon Paganini, PA-C  sucralfate (CARAFATE) 1 g tablet Take 1 tablet (1 g total) by mouth 2 (two) times daily. 11/28/21 12/28/21 Yes Angiulli, Lavon Paganini, PA-C  tadalafil (CIALIS) 20 MG tablet Take 20 mg by mouth daily as needed.   Yes [provider]   tamsulosin (FLOMAX) 0.4 MG CAPS capsule Take 1 capsule (0.4 mg total) by mouth daily. 11/28/21  Yes Angiulli, Lavon Paganini, PA-C  zolpidem (AMBIEN) 10 MG tablet Take 10 mg by mouth at bedtime.   Yes [provider]  acetaminophen (TYLENOL) 325 MG tablet Take 1-2 tablets (325-650 mg total) by mouth every 6 (six) hours as needed for mild pain (pain score 1-3 or temp > 100.5). Patient not taking: Reported on 12/15/2021 11/28/21   Angiulli, Lavon Paganini, PA-C  furosemide (LASIX) 20 MG tablet Take 0.5 tablets (10 mg total) by mouth daily. Patient taking differently: Take 20 mg by mouth daily as needed for edema. 11/28/21   Angiulli, Lavon Paganini, PA-C  loratadine (CLARITIN) 10 MG tablet Take 1 tablet (10 mg total) by mouth daily. Patient not taking: Reported on 12/15/2021 11/28/21   Angiulli, Lavon Paganini, PA-C  nitroGLYCERIN (NITROSTAT) 0.4 MG SL tablet Place 1 tablet (0.4 mg total) under the tongue every 5 (five) minutes as needed for chest pain. 11/28/21   Angiulli, Lavon Paganini, PA-C  Omega-3 Fatty Acids (FISH OIL PO) Take 1 capsule by mouth daily. Patient not taking: Reported on 12/15/2021    [provider]  polyethylene glycol (MIRALAX / GLYCOLAX) 17 g packet Take 17 g by mouth daily. 11/28/21   Angiulli, Lavon Paganini, PA-C  potassium chloride (KLOR-CON M) 10 MEQ tablet Take 1 tablet (10 mEq total) by mouth daily. Patient taking differently: Take 10 mEq by mouth daily as needed (take when the furosemide is taken). 11/28/21   Angiulli, Lavon Paganini, PA-C      Allergies    Codeine, Dilaudid [hydromorphone], and Doxycycline    Review of Systems   Review of Systems  Constitutional:  Positive for fatigue. Negative for chills and fever.  Skin:  Positive for wound.  Neurological:  Positive for weakness.    Physical Exam Updated Vital Signs BP 98/62   Pulse 88   Temp 97.8 F (36.6 C) (Oral)   Resp (!) 23   SpO2 93%  Physical Exam Vitals and nursing note reviewed.  Constitutional:      General:  He is not in acute distress.    Appearance: He is well-developed. He is ill-appearing.  HENT:     Head: Normocephalic and atraumatic.     Mouth/Throat:     Mouth: Mucous membranes are dry.  Eyes:     Conjunctiva/sclera: Conjunctivae normal.  Cardiovascular:     Rate and Rhythm: Normal rate and regular rhythm.     Heart sounds: No murmur heard. Pulmonary:     Effort: Pulmonary effort is normal. No respiratory distress.     Breath sounds: Normal breath sounds.  Abdominal:     Palpations: Abdomen is soft.     Tenderness: There is no abdominal tenderness.  Genitourinary:    Comments: +yellowish brown cloudy urine Musculoskeletal:  General: No swelling.     Cervical back: Neck supple.  Skin:    General: Skin is warm.     Capillary Refill: Capillary refill takes less than 2 seconds.     Comments: +ulceration of L heel, skin breakdown on buttocks   Neurological:     General: No focal deficit present.     Mental Status: He is alert. He is confused.  Psychiatric:        Mood and Affect: Mood normal.     ED Results / Procedures / Treatments   Labs (all labs ordered are listed, but only abnormal results are displayed) Labs Reviewed  URINALYSIS, ROUTINE W REFLEX MICROSCOPIC - Abnormal; Notable for the following components:      Result Value   Color, Urine ORANGE (*)    APPearance TURBID (*)    Hgb urine dipstick LARGE (*)    Bilirubin Urine Careli Luzader (*)    Protein, ur 100 (*)    Leukocytes,Ua MODERATE (*)    All other components within normal limits  COMPREHENSIVE METABOLIC PANEL - Abnormal; Notable for the following components:   Sodium 132 (*)    Calcium 8.0 (*)    Total Protein 6.3 (*)    Albumin 2.5 (*)    Alkaline Phosphatase 145 (*)    Total Bilirubin 1.5 (*)    All other components within normal limits  CBC WITH DIFFERENTIAL/PLATELET - Abnormal; Notable for the following components:   WBC 29.0 (*)    RBC 2.55 (*)    Hemoglobin 9.9 (*)    HCT 30.7 (*)    MCV  120.4 (*)    MCH 38.8 (*)    nRBC 0.6 (*)    Neutro Abs 25.7 (*)    Lymphs Abs 0.6 (*)    Monocytes Absolute 1.1 (*)    Basophils Absolute 0.3 (*)    Abs Immature Granulocytes 1.29 (*)    All other components within normal limits  URINALYSIS, MICROSCOPIC (REFLEX) - Abnormal; Notable for the following components:   Bacteria, UA FEW (*)    All other components within normal limits  CULTURE, BLOOD (ROUTINE X 2)  CULTURE, BLOOD (ROUTINE X 2)  LACTIC ACID, PLASMA  LACTIC ACID, PLASMA  AMMONIA  CK    EKG None  Radiology CT VENOGRAM HEAD  Result Date: 12/15/2021 CLINICAL DATA:  History of dural venous sinus thrombosis. Altered mental status. EXAM: CT VENOGRAM HEAD TECHNIQUE: Venographic phase images of the brain were obtained following the administration of intravenous contrast. Multiplanar reformats and maximum intensity projections were generated. RADIATION DOSE REDUCTION: This exam was performed according to the departmental dose-optimization program which includes automated exposure control, adjustment of the mA and/or kV according to patient size and/or use of iterative reconstruction technique. CONTRAST:  35m OMNIPAQUE IOHEXOL 350 MG/ML SOLN COMPARISON:  11/10/2021 FINDINGS: Superior sagittal sinus: Normal. Straight sinus: Normal. Inferior sagittal sinus, vein of Galen and internal cerebral veins: Normal. Transverse sinuses: Normal. Sigmoid sinuses: Unchanged filling defect within the left sigmoid sinus that extends into the left internal jugular vein. The right sigmoid sinus is normal. Visualized jugular veins: Unchanged left internal jugular vein filling defect. Right side is normal. IMPRESSION: Unchanged venous thrombosis within the left sigmoid sinus that extends into the left internal jugular vein. Electronically Signed   By: KUlyses JarredM.D.   On: 12/15/2021 19:54   CT Head Wo Contrast  Result Date: 12/15/2021 CLINICAL DATA:  Altered mental status EXAM: CT HEAD WITHOUT  CONTRAST TECHNIQUE: Contiguous axial images  were obtained from the base of the skull through the vertex without intravenous contrast. RADIATION DOSE REDUCTION: This exam was performed according to the departmental dose-optimization program which includes automated exposure control, adjustment of the mA and/or kV according to patient size and/or use of iterative reconstruction technique. COMPARISON:  11/10/2021 FINDINGS: Brain: There is no mass, hemorrhage or extra-axial collection. The size and configuration of the ventricles and extra-axial CSF spaces are normal. There is hypoattenuation of the white matter, most commonly indicating chronic Jarmon Javid vessel disease. Vascular: Atherosclerotic calcification of the internal carotid arteries at the skull base. No abnormal hyperdensity of the major intracranial arteries or dural venous sinuses. Skull: The visualized skull base, calvarium and extracranial soft tissues are normal. Sinuses/Orbits: No fluid levels or advanced mucosal thickening of the visualized paranasal sinuses. No mastoid or middle ear effusion. The orbits are normal. IMPRESSION: 1. No acute intracranial abnormality. 2. Chronic [place] Dinneen vessel disease. Electronically Signed   By: Ulyses Jarred M.D.   On: 12/15/2021 19:50    Procedures Procedures    Medications Ordered in ED Medications  HYDROcodone-acetaminophen (NORCO/VICODIN) 5-325 MG per tablet 2 tablet (2 tablets Oral Given 12/15/21 1415)  iohexol (OMNIPAQUE) 350 MG/ML injection 75 mL (75 mLs Intravenous Contrast Given 12/15/21 1919)  cefTRIAXone (ROCEPHIN) 1 g in sodium chloride 0.9 % 100 mL IVPB (1 g Intravenous New Bag/Given 12/15/21 2138)  oxyCODONE (Oxy IR/ROXICODONE) immediate release tablet 10 mg (10 mg Oral Given 12/15/21 2138)    ED Course/ Medical Decision Making/ A&P                           Medical Decision Making Patient is a 66 year old male, here for increased confusion brought in by his wife, for confusion x1 week.  She  states he has not been acting himself, and was improving after his hip replacement, but for the last week is gone downhill.  She notes that he cannot even play with his phone like he used to.  He has slight slurring of his speech.  Also notes that he has multiple ulcerations 1 on his buttocks, and then one on his left heel.  We will obtain lab work, and CT venogram to evaluate his subdural thrombus, and to ensure no head bleeds.  His urine is dark and cloudy, and his catheter has not been changed since being placed at his last hospitalization, we will change the catheter and obtain a urine sample.  Amount and/or Complexity of Data Reviewed Labs: ordered.    Details: Urine is positive for leukocytes, white blood cells, and trace bacteria.  His white count of 29,000, is increased from his normal 18,000. Radiology: ordered.    Details: CT venogram, CT head unchanged Discussion of management or test interpretation with external provider(s): Patient is confused, with increased wounds to buttocks, foot, would benefit from wound care, and inpatient hospitalization given confusion, weakness, debility, lack of ability to perform ADLs.  We will admit for urinary tract infection, increased confusion.  Recommend wound care evaluate him inpatient, and he will likely need more extensive home health care.  As discussed with hospitalist admitted to hospital.  Given 1 dose of ceftriaxone in ER  Risk Prescription drug management. Decision regarding hospitalization.    Final Clinical Impression(s) / ED Diagnoses Final diagnoses:  Weakness  Acute cystitis with hematuria  Wound of buttock, unspecified laterality, initial encounter    Rx / DC Orders ED Discharge Orders     None  Osvaldo Shipper, [place] 12/15/21 2222    Davonna Belling, MD 12/15/21 [phone]

## 2021-12-15 NOTE — ED Notes (Signed)
Pt has significant weeping ulcer on left heel.  Pt also has ulcer to sacrum area and left buttocks.  PA Small visualized with this RN at bedside.

## 2021-12-15 NOTE — H&P (Signed)
History and Physical    Justin [name] RUE:[id number] DOB: 25-Jan-1955 DOA: 12/15/2021  PCP: [name], MD  Patient coming from: Home  Chief Complaint: Altered mental status  HPI: Justin [name] is a 66 y.o. male with medical history significant of essential thrombocythemia, chronic A-fib on Eliquis, dural venous sinus thrombosis, aortic stenosis status post AVR, CAD status post CABG, hypertension, hyperlipidemia, type 2 diabetes, PAD, chronic systolic CHF, BPH, left total hip replacement 11/04/2021 presented to the ED via EMS from home for evaluation of generalized weakness confusion, and slight slurring of speech x 1 week.  Patient noted to have pressure ulcers on his buttocks and left heel.  Slightly tachycardic on arrival to the ED but afebrile.  Labs showing worsening leukocytosis (WBC count 29.0 with left shift, was 18.8 on labs done 2 weeks ago), hemoglobin 9.9 (stable), platelet count 306k, blood cultures drawn, lactic acid normal x 2, ammonia normal.  UA with negative nitrite, moderate amount of leukocytes, and microscopy showing >50 RBCs, >50 WBCs, and few bacteria.  CT head negative for acute finding and CT venogram head showing unchanged venous thrombosis within the left sigmoid sinus that extends into the left internal jugular vein. Patient was given Vicodin, Oxy IR, and ceftriaxone.  TRH called to admit.  Patient states he had his left hip replaced last month and went to rehab.  He was discharged from rehab 2 weeks ago and since then his health has been "up and down."  He reports generalized weakness.  States normally he likes to cook but now does not have any energy to get up from the bed.  He is reporting dysuria, mild cough, and shortness of breath.  Denies fevers, chest pain, abdominal pain, or vomiting.  He is taking Eliquis at home and compliant.  No other complaints.  Review of Systems:  Review of Systems  All other systems reviewed and are negative.   Past  Medical History:  Diagnosis Date   Acute meniscal tear of knee LEFT   Anemia    Aortic stenosis 12/01/2017   NONRHEUMATIC, AORTIC VALVE CALCIFICATIONS, MILD TO MODERATE REGURG, MILD TO MODERATE CALCIFIED ANNULUS per ECHO 10/25/17 @ [facility] [address]   Arthritis    Atrial fibrillation ([place]) 11/12/2017   AT O/V WITH PCP   Cancer ([place])    skin right arm   Coronary artery disease    Dyspnea    GERD (gastroesophageal reflux disease)    Headache    hx of migraines   Heart murmur MILD-- ASYMPTOMATIC   History of kidney stones    Hyperlipidemia    Hypertension    Left knee pain    Mixed diabetic hyperlipidemia associated with type 2 diabetes mellitus ([place]) 11/05/2021   PAD (peripheral artery disease) ([facility])    left leg claudication   Sleep apnea     Past Surgical History:  Procedure Laterality Date   AORTIC VALVE REPLACEMENT N/A 01/03/2018   Procedure: AORTIC VALVE REPLACEMENT (AVR) using 52m Magna Ease Bioprosthesis Aortic Valve;  Surgeon: VIvin Poot MD;  [place]  Service: Open Heart Surgery;  Laterality: N/A;   APPENDECTOMY  1998   BIOPSY  08/11/2018   Procedure: BIOPSY;  Surgeon: NMauri Pole MD;  [place]  Service: Endoscopy;;   BIOPSY  08/20/2021   Procedure: BIOPSY;  Surgeon: MIrving Copas, MD;  [place]  Service: Gastroenterology;;   CATARACT EXTRACTION W/ INTRAOCULAR LENS  IMPLANT, BILATERAL  1998/  2000   CERVICAL  FUSION  1985   C4 - 5   COLONOSCOPY N/A 08/21/2021   Procedure: COLONOSCOPY;  Surgeon: [name], MD;  Location: [place];  Service: Gastroenterology;  Laterality: N/A;   CORONARY ARTERY BYPASS GRAFT N/A 01/03/2018   Procedure: CORONARY ARTERY BYPASS GRAFTING (CABG) x 4 (LIMA to LAD, SVG to DIAGONAL, SVG to RAMUS INTERMEDIATE, and SVG to PDA), USING LEFT INFTERNAL MAMMARY ARTERY AND;  Surgeon: Prescott Gum, Collier Salina, MD;  Location: [place];  Service: Open Heart Surgery;  Laterality: N/A;    ENDARTERECTOMY Right 01/03/2018   Procedure: ENDARTERECTOMY CAROTID;  Surgeon: Marty Heck, MD;  Location[facility];  Service: Vascular;  Laterality: Right;   ESOPHAGOGASTRODUODENOSCOPY (EGD) WITH PROPOFOL N/A 08/11/2018   Procedure: ESOPHAGOGASTRODUODENOSCOPY (EGD) WITH PROPOFOL;  Surgeon: Mauri Pole, MD;  [place];  Service: Endoscopy;  Laterality: N/A;   ESOPHAGOGASTRODUODENOSCOPY (EGD) WITH PROPOFOL N/A 08/20/2021   Procedure: ESOPHAGOGASTRODUODENOSCOPY (EGD) WITH PROPOFOL;  Surgeon: Rush Landmark Telford Nab., MD;  Location: [place];  Service: Gastroenterology;  Laterality: N/A;   HOT HEMOSTASIS N/A 08/20/2021   Procedure: HOT HEMOSTASIS (ARGON PLASMA COAGULATION/BICAP);  Surgeon: Irving Copas., MD;  Location: [place];  Service: Gastroenterology;  Laterality: N/A;   HOT HEMOSTASIS N/A 08/21/2021   Procedure: HOT HEMOSTASIS (ARGON PLASMA COAGULATION/BICAP);  Surgeon: [name], MD;  Location: [place];  Service: Gastroenterology;  Laterality: N/A;   KNEE ARTHROSCOPY W/ MENISCECTOMY  1991  1987   LEFT KNEE   x 2   NASAL SINUS SURGERY  1982   ORIF ANKLE FRACTURE Right 10/07/2019   Procedure: OPEN REDUCTION INTERNAL FIXATION (ORIF) ANKLE FRACTURE;  Surgeon: Hiram Gash, MD;  Location: [place];  Service: Orthopedics;  Laterality: Right;  Regional RIGHT  ankle block as well.   RIGHT/LEFT HEART CATH AND CORONARY ANGIOGRAPHY N/A 12/24/2017   Procedure: RIGHT/LEFT HEART CATH AND CORONARY ANGIOGRAPHY;  Surgeon: Jolaine Artist, MD;  Location: [place] [facility];  Service: Cardiovascular;  Laterality: N/A;   ROTATOR CUFF REPAIR  09-04-2005   LEFT SHOULDER   TEE WITHOUT CARDIOVERSION N/A 12/24/2017   Procedure: TRANSESOPHAGEAL ECHOCARDIOGRAM (TEE);  Surgeon: Jolaine Artist, MD;  Location: [facility] ENDOSCOPY;  Service: Cardiovascular;  Laterality: N/A;   TEE WITHOUT CARDIOVERSION N/A 01/03/2018   Procedure: TRANSESOPHAGEAL ECHOCARDIOGRAM (TEE);   Surgeon: Prescott Gum, Collier Salina, MD;  Location: [place];  Service: Open Heart Surgery;  Laterality: N/A;   TONSILLECTOMY  AGE 74   TOTAL HIP ARTHROPLASTY Right 12/23/2018   Procedure: TOTAL HIP ARTHROPLASTY ANTERIOR APPROACH;  Surgeon: Paralee Cancel, MD;  Location: [facility];  Service: Orthopedics;  Laterality: Right;  70 mins   TOTAL HIP ARTHROPLASTY Left 11/04/2021   Procedure: TOTAL HIP ARTHROPLASTY ANTERIOR APPROACH;  Surgeon: Paralee Cancel, MD;  Location: [facility];  Service: Orthopedics;  Laterality: Left;     reports that he quit smoking about 3 years ago. His smoking use included cigars and cigarettes. He has a 5.75 pack-year smoking history. He has never used smokeless tobacco. He reports that he does not currently use alcohol after a past usage of about 2.0 standard drinks of alcohol per week. He reports that he does not use drugs.  Allergies  Allergen Reactions   Codeine Swelling and Other (See Comments)    Tolerates hydrocodone, tramadol, and oxycodone    Dilaudid [Hydromorphone] Anxiety    Hallucinations    Doxycycline Itching and Rash    Family History  Adopted: Yes  Problem Relation Age of Onset   Bladder  Cancer Father        adopted father    Prior to Admission medications   Medication Sig Start Date End Date Taking? Authorizing Provider  albuterol (VENTOLIN HFA) 108 (90 Base) MCG/ACT inhaler Inhale 1 puff into the lungs every 6 (six) hours as needed for shortness of breath. 11/28/21  Yes Angiulli, Lavon Paganini, PA-C  Carboxymethylcellulose Sodium (REFRESH TEARS OP) Place 1-2 drops into both eyes daily as needed (dry eyes).   Yes [provider]  Cholecalciferol 125 MCG (5000 UT) TABS Take 1 tablet (5,000 Units total) by mouth daily. 11/28/21  Yes Angiulli, Lavon Paganini, PA-C  docusate sodium (COLACE) 100 MG capsule Take 100 mg by mouth daily as needed for mild constipation.   Yes [provider]  ELIQUIS 5 MG TABS tablet Take 1 tablet (5 mg total) by mouth 2 (two) times  daily. 11/28/21  Yes Angiulli, Lavon Paganini, PA-C  ferrous sulfate 325 (65 FE) MG tablet Take 1 tablet (325 mg total) by mouth 2 (two) times daily with a meal. 11/28/21  Yes Angiulli, Lavon Paganini, PA-C  hydroxyurea (HYDREA) 500 MG capsule TAKE ONE CAPSULE BY MOUTH TWICE A DAY *MAY TAKE WITH FOOD TO MINIMIZE GI SIDE EFFECTS Patient taking differently: Take 500 mg by mouth 2 (two) times daily. MAY TAKE WITH FOOD TO MINIMIZE GI SIDE EFFECTS 11/28/21  Yes Angiulli, Lavon Paganini, PA-C  magnesium oxide (MAG-OX) 400 MG tablet Take 1 tablet (400 mg total) by mouth daily. 11/28/21  Yes Angiulli, Lavon Paganini, PA-C  methocarbamol (ROBAXIN) 500 MG tablet Take 1 tablet (500 mg total) by mouth every 6 (six) hours as needed for muscle spasms. 11/28/21  Yes Angiulli, Lavon Paganini, PA-C  metoprolol tartrate (LOPRESSOR) 25 MG tablet Take 1 tablet (25 mg total) by mouth 2 (two) times daily. 11/28/21  Yes Angiulli, Lavon Paganini, PA-C  Multiple Vitamins-Minerals (CENTRUM ADULTS) TABS Take 1 capsule by mouth daily.   Yes [provider]  oxyCODONE (OXY IR/ROXICODONE) 5 MG immediate release tablet Take 1 tablet (5 mg total) by mouth every 4 (four) hours as needed for moderate pain. 11/28/21  Yes Angiulli, Lavon Paganini, PA-C  oxymetazoline (AFRIN) 0.05 % nasal spray Place 1 spray into both nostrils daily as needed for congestion.   Yes [provider]  pantoprazole (PROTONIX) 40 MG tablet Take 1 tablet (40 mg total) by mouth daily. Patient taking differently: Take 40 mg by mouth 2 (two) times daily. 11/28/21  Yes Angiulli, Lavon Paganini, PA-C  rosuvastatin (CRESTOR) 20 MG tablet Take 1 tablet (20 mg total) by mouth daily. 11/28/21  Yes Angiulli, Lavon Paganini, PA-C  sucralfate (CARAFATE) 1 g tablet Take 1 tablet (1 g total) by mouth 2 (two) times daily. 11/28/21 12/28/21 Yes Angiulli, Lavon Paganini, PA-C  tadalafil (CIALIS) 20 MG tablet Take 20 mg by mouth daily as needed.   Yes [provider]  tamsulosin (FLOMAX) 0.4 MG CAPS capsule  Take 1 capsule (0.4 mg total) by mouth daily. 11/28/21  Yes Angiulli, Lavon Paganini, PA-C  zolpidem (AMBIEN) 10 MG tablet Take 10 mg by mouth at bedtime.   Yes [provider]  acetaminophen (TYLENOL) 325 MG tablet Take 1-2 tablets (325-650 mg total) by mouth every 6 (six) hours as needed for mild pain (pain score 1-3 or temp > 100.5). Patient not taking: Reported on 12/15/2021 11/28/21   Angiulli, Lavon Paganini, PA-C  furosemide (LASIX) 20 MG tablet Take 0.5 tablets (10 mg total) by mouth daily. Patient taking differently: Take 20 mg  by mouth daily as needed for edema. 11/28/21   Angiulli, Lavon Paganini, PA-C  loratadine (CLARITIN) 10 MG tablet Take 1 tablet (10 mg total) by mouth daily. Patient not taking: Reported on 12/15/2021 11/28/21   Angiulli, Lavon Paganini, PA-C  nitroGLYCERIN (NITROSTAT) 0.4 MG SL tablet Place 1 tablet (0.4 mg total) under the tongue every 5 (five) minutes as needed for chest pain. 11/28/21   Angiulli, Lavon Paganini, PA-C  Omega-3 Fatty Acids (FISH OIL PO) Take 1 capsule by mouth daily. Patient not taking: Reported on 12/15/2021    [provider]  polyethylene glycol (MIRALAX / GLYCOLAX) 17 g packet Take 17 g by mouth daily. 11/28/21   Angiulli, Lavon Paganini, PA-C  potassium chloride (KLOR-CON M) 10 MEQ tablet Take 1 tablet (10 mEq total) by mouth daily. Patient taking differently: Take 10 mEq by mouth daily as needed (take when the furosemide is taken). 11/28/21   Cathlyn Parsons, PA-C    Physical Exam: Vitals:   12/15/21 2030 12/15/21 2045 12/15/21 2100 12/15/21 2130  BP: 105/87 109/73 119/66 98/62  Pulse: 95 (!) 103 (!) 101 88  Resp: '19 18 20 '$ (!) 23  Temp:      TempSrc:      SpO2: 99% 90% 96% 93%    Physical Exam Vitals reviewed.  Constitutional:      General: He is not in acute distress. HENT:     Head: Normocephalic and atraumatic.  Eyes:     Extraocular Movements: Extraocular movements intact.  Cardiovascular:     Rate and Rhythm: Normal rate and regular  rhythm.     Pulses: Normal pulses.  Pulmonary:     Effort: Pulmonary effort is normal. No respiratory distress.     Breath sounds: Normal breath sounds. No wheezing or rales.  Abdominal:     General: Bowel sounds are normal. There is no distension.     Palpations: Abdomen is soft.     Tenderness: There is no abdominal tenderness.  Musculoskeletal:     Cervical back: Normal range of motion.     Right lower leg: Edema present.     Left lower leg: Edema present.     Comments: +2 pitting edema of bilateral lower legs with mild erythema/changes of venous stasis dermatitis Stage II pressure ulcer without drainage or signs of infection noted on the left buttock and left heel  Skin:    General: Skin is warm and dry.  Neurological:     General: No focal deficit present.     Mental Status: He is alert and oriented to person, place, and time.     Cranial Nerves: No cranial nerve deficit.     Sensory: No sensory deficit.     Motor: No weakness.     Labs on Admission: I have personally reviewed following labs and imaging studies  CBC: Recent Labs  Lab 12/15/21 1419  WBC 29.0*  NEUTROABS 25.7*  HGB 9.9*  HCT 30.7*  MCV 120.4*  PLT 683   Basic Metabolic Panel: Recent Labs  Lab 12/15/21 1419  NA 132*  K 3.7  CL 100  CO2 23  GLUCOSE 79  BUN 15  CREATININE 1.01  CALCIUM 8.0*   GFR: CrCl cannot be calculated (Unknown ideal weight.). Liver Function Tests: Recent Labs  Lab 12/15/21 1419  AST 26  ALT 15  ALKPHOS 145*  BILITOT 1.5*  PROT 6.3*  ALBUMIN 2.5*   No results for input(s): "LIPASE", "AMYLASE" in the last 168 hours. Recent Labs  Lab 12/15/21 1745  AMMONIA 31   Coagulation Profile: No results for input(s): "INR", "PROTIME" in the last 168 hours. Cardiac Enzymes: Recent Labs  Lab 12/15/21 1419  CKTOTAL 64   BNP (last 3 results) No results for input(s): "PROBNP" in the last 8760 hours. HbA1C: No results for input(s): "HGBA1C" in the last 72  hours. CBG: No results for input(s): "GLUCAP" in the last 168 hours. Lipid Profile: No results for input(s): "CHOL", "HDL", "LDLCALC", "TRIG", "CHOLHDL", "LDLDIRECT" in the last 72 hours. Thyroid Function Tests: No results for input(s): "TSH", "T4TOTAL", "FREET4", "T3FREE", "THYROIDAB" in the last 72 hours. Anemia Panel: No results for input(s): "VITAMINB12", "FOLATE", "FERRITIN", "TIBC", "IRON", "RETICCTPCT" in the last 72 hours. Urine analysis:    Component Value Date/Time   COLORURINE ORANGE (A) 12/15/2021 1950   APPEARANCEUR TURBID (A) 12/15/2021 1950   LABSPEC 1.010 12/15/2021 1950   PHURINE 5.5 12/15/2021 1950   GLUCOSEU NEGATIVE 12/15/2021 1950   HGBUR LARGE (A) 12/15/2021 1950   BILIRUBINUR SMALL (A) 12/15/2021 [place] NEGATIVE 12/15/2021 1950   PROTEINUR 100 (A) 12/15/2021 1950   NITRITE NEGATIVE 12/15/2021 1950   LEUKOCYTESUR MODERATE (A) 12/15/2021 1950    Radiological Exams on Admission: CT VENOGRAM HEAD  Result Date: 12/15/2021 CLINICAL DATA:  History of dural venous sinus thrombosis. Altered mental status. EXAM: CT VENOGRAM HEAD TECHNIQUE: Venographic phase images of the brain were obtained following the administration of intravenous contrast. Multiplanar reformats and maximum intensity projections were generated. RADIATION DOSE REDUCTION: This exam was performed according to the departmental dose-optimization program which includes automated exposure control, adjustment of the mA and/or kV according to patient size and/or use of iterative reconstruction technique. CONTRAST:  56m OMNIPAQUE IOHEXOL 350 MG/ML SOLN COMPARISON:  11/10/2021 FINDINGS: Superior sagittal sinus: Normal. Straight sinus: Normal. Inferior sagittal sinus, vein of Galen and internal cerebral veins: Normal. Transverse sinuses: Normal. Sigmoid sinuses: Unchanged filling defect within the left sigmoid sinus that extends into the left internal jugular vein. The right sigmoid sinus is normal.  Visualized jugular veins: Unchanged left internal jugular vein filling defect. Right side is normal. IMPRESSION: Unchanged venous thrombosis within the left sigmoid sinus that extends into the left internal jugular vein. Electronically Signed   By: KUlyses JarredM.D.   On: 12/15/2021 19:54   CT Head Wo Contrast  Result Date: 12/15/2021 CLINICAL DATA:  Altered mental status EXAM: CT HEAD WITHOUT CONTRAST TECHNIQUE: Contiguous axial images were obtained from the base of the skull through the vertex without intravenous contrast. RADIATION DOSE REDUCTION: This exam was performed according to the departmental dose-optimization program which includes automated exposure control, adjustment of the mA and/or kV according to patient size and/or use of iterative reconstruction technique. COMPARISON:  11/10/2021 FINDINGS: Brain: There is no mass, hemorrhage or extra-axial collection. The size and configuration of the ventricles and extra-axial CSF spaces are normal. There is hypoattenuation of the white matter, most commonly indicating chronic small vessel disease. Vascular: Atherosclerotic calcification of the internal carotid arteries at the skull base. No abnormal hyperdensity of the major intracranial arteries or dural venous sinuses. Skull: The visualized skull base, calvarium and extracranial soft tissues are normal. Sinuses/Orbits: No fluid levels or advanced mucosal thickening of the visualized paranasal sinuses. No mastoid or middle ear effusion. The orbits are normal. IMPRESSION: 1. No acute intracranial abnormality. 2. Chronic small vessel disease. Electronically Signed   By: KUlyses JarredM.D.   On: 12/15/2021 19:50    EKG: Independently reviewed.  Sinus or ectopic atrial  rhythm, QTc 491.  Assessment and Plan  Possible UTI Patient is reporting dysuria and UA with evidence of pyuria.  Labs showing worsening leukocytosis but patient is afebrile.  Tachycardia has resolved.  Lactic acid normal x 2.  No signs  of sepsis at this time. -Continue ceftriaxone -Add on urine culture -Blood cultures pending -Monitor WBC count  Acute metabolic encephalopathy Likely due to UTI.  CT head negative for acute finding and CT venogram head showing stable dural venous sinus thrombosis.  Speech fluent and no focal neurodeficit on exam.  Ammonia level is normal.  At present, patient is answering questions appropriately and does not appear confused. -Continue management of possible UTI as mentioned above  Acute hypoxemic respiratory failure Oxygen saturation 87-90% on room air with good waveform, improved with 2 L O2.  Patient does not use oxygen at home.  Lungs clear on exam.  Currently not tachycardic and PE felt to be less likely as he is chronically anticoagulated with Eliquis and reports compliance. -Chest x-ray -Continue supplemental oxygen, wean as tolerated  Generalized weakness/physical deconditioning -PT/OT eval, fall precautions  Stage II pressure ulcer of left buttock and left heel Do not appear infected. -Wound care  QT prolongation QTc 491.  Potassium is normal. -Cardiac monitoring -Check magnesium level -Avoid QT prolonging drugs -Repeat EKG in a.m.  Chronic anemia Hemoglobin stable and no signs of active bleeding. -Continue to monitor  Essential thrombocythemia Platelet count normal. -Continue hydroxyurea after pharmacy med rec is done. -Monitor platelet count  Chronic A-fib Stable. -Continue Eliquis after pharmacy med rec is done. -Hold metoprolol at this time given soft blood pressure.  Dural venous sinus thrombosis Stable on CT. -Continue Eliquis after pharmacy med rec is done  Diet controlled type 2 diabetes A1c 5.0 on 11/05/2021.  Hypertension -Avoid antihypertensives at this time as blood pressure is soft.  Chronic systolic CHF Echo done last month showing EF 45 to 50%.  He has peripheral edema but no rales appreciated upon auscultation of the lungs. -Hold Lasix at  this time as blood pressure is soft. -Chest x-ray ordered -Check BNP  BPH Hyperlipidemia PAD CAD: Not endorsing chest pain and EKG without acute ischemic changes. -Pharmacy med rec pending.  DVT prophylaxis: Continue Eliquis after pharmacy med rec is done. Code Status: DNR (discussed with the patient) Family Communication: No family available at this time. Level of care: Telemetry bed Admission status: It is my clinical opinion that referral for OBSERVATION is reasonable and necessary in this patient based on the above information provided. The aforementioned taken together are felt to place the patient at high risk for further clinical deterioration. However, it is anticipated that the patient may be medically stable for discharge from the hospital within 24 to 48 hours.   Shela Leff MD [facility]  If 7PM-7AM, please contact night-coverage [email or web address]  12/15/2021, 11:38 PM

## 2021-12-16 ENCOUNTER — Observation Stay (HOSPITAL_COMMUNITY): Payer: Medicare Other

## 2021-12-16 DIAGNOSIS — S31809A Unspecified open wound of unspecified buttock, initial encounter: Secondary | ICD-10-CM | POA: Diagnosis not present

## 2021-12-16 DIAGNOSIS — I4891 Unspecified atrial fibrillation: Secondary | ICD-10-CM | POA: Diagnosis not present

## 2021-12-16 DIAGNOSIS — N390 Urinary tract infection, site not specified: Secondary | ICD-10-CM | POA: Diagnosis present

## 2021-12-16 DIAGNOSIS — Y846 Urinary catheterization as the cause of abnormal reaction of the patient, or of later complication, without mention of misadventure at the time of the procedure: Secondary | ICD-10-CM | POA: Diagnosis not present

## 2021-12-16 DIAGNOSIS — Y731 Therapeutic (nonsurgical) and rehabilitative gastroenterology and urology devices associated with adverse incidents: Secondary | ICD-10-CM | POA: Diagnosis present

## 2021-12-16 DIAGNOSIS — I482 Chronic atrial fibrillation, unspecified: Secondary | ICD-10-CM | POA: Diagnosis present

## 2021-12-16 DIAGNOSIS — B964 Proteus (mirabilis) (morganii) as the cause of diseases classified elsewhere: Secondary | ICD-10-CM | POA: Diagnosis present

## 2021-12-16 DIAGNOSIS — L89620 Pressure ulcer of left heel, unstageable: Secondary | ICD-10-CM | POA: Diagnosis present

## 2021-12-16 DIAGNOSIS — I2510 Atherosclerotic heart disease of native coronary artery without angina pectoris: Secondary | ICD-10-CM | POA: Diagnosis not present

## 2021-12-16 DIAGNOSIS — N138 Other obstructive and reflux uropathy: Secondary | ICD-10-CM | POA: Diagnosis present

## 2021-12-16 DIAGNOSIS — Z951 Presence of aortocoronary bypass graft: Secondary | ICD-10-CM | POA: Diagnosis not present

## 2021-12-16 DIAGNOSIS — R64 Cachexia: Secondary | ICD-10-CM | POA: Diagnosis present

## 2021-12-16 DIAGNOSIS — N3001 Acute cystitis with hematuria: Secondary | ICD-10-CM | POA: Diagnosis not present

## 2021-12-16 DIAGNOSIS — D693 Immune thrombocytopenic purpura: Secondary | ICD-10-CM | POA: Diagnosis not present

## 2021-12-16 DIAGNOSIS — L89622 Pressure ulcer of left heel, stage 2: Secondary | ICD-10-CM | POA: Diagnosis not present

## 2021-12-16 DIAGNOSIS — I5023 Acute on chronic systolic (congestive) heart failure: Secondary | ICD-10-CM

## 2021-12-16 DIAGNOSIS — E1151 Type 2 diabetes mellitus with diabetic peripheral angiopathy without gangrene: Secondary | ICD-10-CM | POA: Diagnosis present

## 2021-12-16 DIAGNOSIS — G08 Intracranial and intraspinal phlebitis and thrombophlebitis: Secondary | ICD-10-CM | POA: Diagnosis not present

## 2021-12-16 DIAGNOSIS — Z952 Presence of prosthetic heart valve: Secondary | ICD-10-CM | POA: Diagnosis not present

## 2021-12-16 DIAGNOSIS — N401 Enlarged prostate with lower urinary tract symptoms: Secondary | ICD-10-CM | POA: Diagnosis not present

## 2021-12-16 DIAGNOSIS — D539 Nutritional anemia, unspecified: Secondary | ICD-10-CM | POA: Diagnosis present

## 2021-12-16 DIAGNOSIS — I700 Atherosclerosis of aorta: Secondary | ICD-10-CM | POA: Diagnosis not present

## 2021-12-16 DIAGNOSIS — I110 Hypertensive heart disease with heart failure: Secondary | ICD-10-CM | POA: Diagnosis not present

## 2021-12-16 DIAGNOSIS — R531 Weakness: Secondary | ICD-10-CM | POA: Diagnosis present

## 2021-12-16 DIAGNOSIS — N3000 Acute cystitis without hematuria: Secondary | ICD-10-CM

## 2021-12-16 DIAGNOSIS — J9601 Acute respiratory failure with hypoxia: Secondary | ICD-10-CM | POA: Diagnosis present

## 2021-12-16 DIAGNOSIS — E1169 Type 2 diabetes mellitus with other specified complication: Secondary | ICD-10-CM | POA: Diagnosis not present

## 2021-12-16 DIAGNOSIS — L89152 Pressure ulcer of sacral region, stage 2: Secondary | ICD-10-CM | POA: Diagnosis present

## 2021-12-16 DIAGNOSIS — L8992 Pressure ulcer of unspecified site, stage 2: Secondary | ICD-10-CM | POA: Insufficient documentation

## 2021-12-16 DIAGNOSIS — E871 Hypo-osmolality and hyponatremia: Secondary | ICD-10-CM | POA: Diagnosis not present

## 2021-12-16 DIAGNOSIS — D473 Essential (hemorrhagic) thrombocythemia: Secondary | ICD-10-CM | POA: Diagnosis present

## 2021-12-16 DIAGNOSIS — T83511A Infection and inflammatory reaction due to indwelling urethral catheter, initial encounter: Secondary | ICD-10-CM | POA: Diagnosis present

## 2021-12-16 DIAGNOSIS — Z66 Do not resuscitate: Secondary | ICD-10-CM | POA: Diagnosis present

## 2021-12-16 DIAGNOSIS — R0902 Hypoxemia: Secondary | ICD-10-CM | POA: Diagnosis not present

## 2021-12-16 DIAGNOSIS — K219 Gastro-esophageal reflux disease without esophagitis: Secondary | ICD-10-CM | POA: Diagnosis not present

## 2021-12-16 DIAGNOSIS — E43 Unspecified severe protein-calorie malnutrition: Secondary | ICD-10-CM | POA: Diagnosis present

## 2021-12-16 DIAGNOSIS — J9811 Atelectasis: Secondary | ICD-10-CM | POA: Diagnosis not present

## 2021-12-16 DIAGNOSIS — G9341 Metabolic encephalopathy: Secondary | ICD-10-CM | POA: Diagnosis not present

## 2021-12-16 DIAGNOSIS — E782 Mixed hyperlipidemia: Secondary | ICD-10-CM | POA: Diagnosis not present

## 2021-12-16 DIAGNOSIS — J189 Pneumonia, unspecified organism: Secondary | ICD-10-CM | POA: Diagnosis not present

## 2021-12-16 LAB — BRAIN NATRIURETIC PEPTIDE: B Natriuretic Peptide: 1717.8 pg/mL — ABNORMAL HIGH (ref 0.0–100.0)

## 2021-12-16 LAB — CBC
HCT: 29.3 % — ABNORMAL LOW (ref 39.0–52.0)
Hemoglobin: 9.7 g/dL — ABNORMAL LOW (ref 13.0–17.0)
MCH: 39.6 pg — ABNORMAL HIGH (ref 26.0–34.0)
MCHC: 33.1 g/dL (ref 30.0–36.0)
MCV: 119.6 fL — ABNORMAL HIGH (ref 80.0–100.0)
Platelets: 283 10*3/uL (ref 150–400)
RBC: 2.45 MIL/uL — ABNORMAL LOW (ref 4.22–5.81)
WBC: 29.2 10*3/uL — ABNORMAL HIGH (ref 4.0–10.5)
nRBC: 0.3 % — ABNORMAL HIGH (ref 0.0–0.2)

## 2021-12-16 LAB — MAGNESIUM: Magnesium: 1.7 mg/dL (ref 1.7–2.4)

## 2021-12-16 MED ORDER — HYDROXYUREA 500 MG PO CAPS
500.0000 mg | ORAL_CAPSULE | Freq: Two times a day (BID) | ORAL | Status: DC
  Administered 2021-12-16 – 2021-12-24 (×16): 500 mg via ORAL
  Filled 2021-12-16 (×16): qty 1

## 2021-12-16 MED ORDER — ACETAMINOPHEN 650 MG RE SUPP: 650.0000 mg | Freq: Four times a day (QID) | RECTAL | Status: DC | PRN

## 2021-12-16 MED ORDER — VITAMIN D 25 MCG (1000 UNIT) PO TABS
5000.0000 [IU] | ORAL_TABLET | Freq: Every day | ORAL | Status: DC
  Administered 2021-12-17 – 2021-12-24 (×8): 5000 [IU] via ORAL
  Filled 2021-12-16 (×8): qty 5

## 2021-12-16 MED ORDER — ACETAMINOPHEN 325 MG PO TABS
650.0000 mg | ORAL_TABLET | Freq: Four times a day (QID) | ORAL | Status: DC | PRN
  Administered 2021-12-16 – 2021-12-21 (×4): 650 mg via ORAL
  Filled 2021-12-16 (×5): qty 2

## 2021-12-16 MED ORDER — OXYCODONE HCL 5 MG PO TABS
5.0000 mg | ORAL_TABLET | Freq: Four times a day (QID) | ORAL | Status: DC | PRN
  Administered 2021-12-16 – 2021-12-23 (×13): 5 mg via ORAL
  Filled 2021-12-16 (×13): qty 1

## 2021-12-16 MED ORDER — SODIUM CHLORIDE 0.9 % IV SOLN
1.0000 g | INTRAVENOUS | Status: DC
  Administered 2021-12-16: 1 g via INTRAVENOUS
  Filled 2021-12-16: qty 10

## 2021-12-16 MED ORDER — APIXABAN 5 MG PO TABS
5.0000 mg | ORAL_TABLET | Freq: Two times a day (BID) | ORAL | Status: DC
  Administered 2021-12-16 – 2021-12-17 (×3): 5 mg via ORAL
  Filled 2021-12-16 (×3): qty 1

## 2021-12-16 MED ORDER — PANTOPRAZOLE SODIUM 40 MG PO TBEC
40.0000 mg | DELAYED_RELEASE_TABLET | Freq: Two times a day (BID) | ORAL | Status: DC
  Administered 2021-12-16 – 2021-12-24 (×16): 40 mg via ORAL
  Filled 2021-12-16 (×16): qty 1

## 2021-12-16 MED ORDER — DOCUSATE SODIUM 100 MG PO CAPS: 100.0000 mg | ORAL_CAPSULE | Freq: Every day | ORAL | Status: DC | PRN

## 2021-12-16 MED ORDER — SUCRALFATE 1 G PO TABS
1.0000 g | ORAL_TABLET | Freq: Two times a day (BID) | ORAL | Status: DC
  Administered 2021-12-16 – 2021-12-24 (×16): 1 g via ORAL
  Filled 2021-12-16 (×16): qty 1

## 2021-12-16 MED ORDER — FERROUS SULFATE 325 (65 FE) MG PO TABS
325.0000 mg | ORAL_TABLET | Freq: Two times a day (BID) | ORAL | Status: DC
  Administered 2021-12-16 – 2021-12-18 (×4): 325 mg via ORAL
  Filled 2021-12-16 (×4): qty 1

## 2021-12-16 MED ORDER — FUROSEMIDE 40 MG PO TABS
40.0000 mg | ORAL_TABLET | Freq: Every day | ORAL | Status: DC
  Administered 2021-12-16: 40 mg via ORAL
  Filled 2021-12-16: qty 2

## 2021-12-16 MED ORDER — ROSUVASTATIN CALCIUM 20 MG PO TABS
20.0000 mg | ORAL_TABLET | Freq: Every day | ORAL | Status: DC
  Administered 2021-12-16 – 2021-12-24 (×9): 20 mg via ORAL
  Filled 2021-12-16 (×9): qty 1

## 2021-12-16 NOTE — Progress Notes (Signed)
[facility]  PROGRESS NOTE  Justin [name] PYK:[id number] DOB: 30-Nov-1955 DOA: 12/15/2021 PCP: [name], MD   Brief HPI:   66 year old male with medical history of essential thrombocythemia, chronic atrial fibrillation on anticoagulation with Eliquis, dural venous sinus thrombosis, aortic stenosis s/p AVR, CAD s/p CABG, hypertension, hyperlipidemia, diabetes mellitus type 2, PAD, chronic systolic CHF, BPH, left total hip replacement 11/04/2021 presented to ED via EMS from home for evaluation of generalized weakness, confusion slurring speech for 1 week. Patient noted to have pressure ulcers on buttocks and left heel.  Lab work showed WBC 29,000 with left shift, CT head was negative for acute finding, CT venogram head showed unchanged with thrombosis within the left sigmoid sinus that extends into the left internal jugular vein.  Patient had left hip replacement and went to rehab last month.  He was discharged from the rehab 2 weeks ago.  He has been complaining generalized weakness with mild cough and shortness of breath with dysuria.  He has been taking Eliquis at home.    Subjective   Complains of mild shortness of breath.   Assessment/Plan:    ?  UTI -Presents with dysuria UA with evidence of pyuria -Labs showed worsening leukocytosis -Lactic acid x2 with normal -Patient started on ceftriaxone -Urine culture and blood culture obtained -WBC is still elevated at [address]  Acute metabolic encephalopathy -Likely due to UTI -CT head negative for acute finding -CT venogram head showed dural venous sinus thrombosis -Continue management of UTI as above  Acute hypoxemic respiratory failure -Currently requiring 2 L of oxygen via nasal cannula -Unclear etiology; doubt PE as patient has chronically anticoagulated -Chest x-ray shows moderate severity interstitial edema -He does have chronic systolic CHF with EF 45 to 50% with global hypokinesis as per echo from 10/23 -BNP  elevated 1717   Acute on chronic systolic CHF -As above EF 40 to 50% with global hypokinesis as per echo -Patient takes Lasix as needed at home -BNP found to be elevated 1717 with chest x-ray showing moderate pulmonary edema -We will start Lasix 40 mg p.o. daily -We will avoid IV Lasix due to soft blood pressure  Chronic atrial fibrillation -Stable -Continue Eliquis -Metoprolol on hold due to soft blood pressure  Intravenous sinus thrombosis -Stable on CT venogram -Continue Eliquis  Stage II pressure ulcer of the buttock and left heel -Wound care consulted  Essential thrombocythemia -Continue hydroxyurea  Medications      Data Reviewed:   CBG:  No results for input(s): "GLUCAP" in the last 168 hours.  SpO2: 95 % O2 Flow Rate (L/min): 2 L/min    Vitals:   12/16/21 0345 12/16/21 0430 12/16/21 0515 12/16/21 0818  BP: 116/66 114/65 95/68 114/71  Pulse: (!) 101 (!) 103 (!) 101 (!) 106  Resp: 19 17 (!) 28 20  Temp:    97.7 F (36.5 C)  TempSrc:    Oral  SpO2: 94% 94% 91% 95%      Data Reviewed:  Basic Metabolic Panel: Recent Labs  Lab 12/15/21 1419 12/16/21 0330  NA 132*  --   K 3.7  --   CL 100  --   CO2 23  --   GLUCOSE 79  --   BUN 15  --   CREATININE 1.01  --   CALCIUM 8.0*  --   MG  --  1.7    CBC: Recent Labs  Lab 12/15/21 1419 12/16/21 0330  WBC 29.0* 29.2*  NEUTROABS 25.7*  --  HGB 9.9* 9.7*  HCT 30.7* 29.3*  MCV 120.4* 119.6*  PLT 306 283    LFT Recent Labs  Lab 12/15/21 1419  AST 26  ALT 15  ALKPHOS 145*  BILITOT 1.5*  PROT 6.3*  ALBUMIN 2.5*     Antibiotics: Anti-infectives (From admission, onward)    Start     Dose/Rate Route Frequency Ordered Stop   12/16/21 2100  cefTRIAXone (ROCEPHIN) 1 g in sodium chloride 0.9 % 100 mL IVPB        1 g 200 mL/hr over 30 Minutes Intravenous Every 24 hours 12/16/21 0049     12/15/21 2115  cefTRIAXone (ROCEPHIN) 1 g in sodium chloride 0.9 % 100 mL IVPB        1 g 200 mL/hr  over 30 Minutes Intravenous  Once 12/15/21 2109 12/15/21 2230        DVT prophylaxis: Eliquis  Code Status: DNR  Family Communication: No family at bedside   CONSULTS    Objective    Physical Examination:   General: Appears in no acute distress Cardiovascular: S1-S2, regular Respiratory: Decreased breath sounds at lung bases Abdomen: Soft, nontender, no organomegaly Extremities: No edema in the lower extremities Neurologic:  Alert, oriented x3 , no focal deficit noted   Status is: Inpatient: Acute hypoxemic respiratory failure           [place]   [facility] If 7PM-7AM, please contact night-coverage at [email or web address], Office  [phone]   12/16/2021, 8:43 AM  LOS: 0 days

## 2021-12-16 NOTE — Evaluation (Signed)
Occupational Therapy Evaluation Patient Details Name: Justin [name] MRN: [id number] DOB: 11/05/1955 Today's Date: 12/16/2021   History of Present Illness Pt is a 66 y/o male who admitted for acute hypoxemic respiratory failure and acute metabolic encephalopathy in setting of UTI. PMH: a fib, DM2, HTN, CHF, recent L hip sx.   Clinical Impression   PTA, pt lives with spouse and recently discharged from [place]. Pt presents with deficits in cognition, strength, standing balance and endurance. Pt also with LLE pain (L heel wound primarily). Pt requires Mod A x 2 for safety in standing attempts, reporting dizziness throughout session with noted drop in BP. As pt at high risk for falls and below baseline, rec SNF rehab at DC unless wife able to provide the physical assistance pt currently requires. Will continue to follow acutely.     Recommendations for follow up therapy are one component of a multi-disciplinary discharge planning process, led by the attending physician.  Recommendations may be updated based on patient status, additional functional criteria and insurance authorization.   Follow Up Recommendations  Skilled nursing-short term rehab (<3 hours/day)     Assistance Recommended at Discharge Frequent or constant Supervision/Assistance  Patient can return home with the following A lot of help with walking and/or transfers;A lot of help with bathing/dressing/bathroom    Functional Status Assessment  Patient has had a recent decline in their functional status and demonstrates the ability to make significant improvements in function in a reasonable and predictable amount of time.  Equipment Recommendations  Other (comment) (TBD pending progress)    Recommendations for Other Services       Precautions / Restrictions Precautions Precautions: Fall;Other (comment) Precaution Comments: Wounds on L heel and sacral area, monitor orthostatics, O2 Restrictions Weight Bearing Restrictions:  Yes LLE Weight Bearing: Weight bearing as tolerated      Mobility Bed Mobility Overal bed mobility: Needs Assistance Bed Mobility: Supine to Sit, Sit to Supine     Supine to sit: Mod assist, +2 for safety/equipment Sit to supine: Mod assist, +2 for safety/equipment   General bed mobility comments: Mod A x 2 for safety in advancing BLE off of stretcher and lifting trunk. slow moving but assisting where he can. Assist for BLE back into bed and safely guiding trunk back to bed    Transfers Overall transfer level: Needs assistance Equipment used: Rolling walker (2 wheels) Transfers: Sit to/from Stand Sit to Stand: Mod assist, +2 safety/equipment           General transfer comment: Mod A x 2 to stand from stretcher with B feet blocking to prevent sliding. slow to rise and assume upright posture      Balance Overall balance assessment: Needs assistance Sitting-balance support: No upper extremity supported, Feet supported Sitting balance-Leahy Scale: Fair     Standing balance support: Bilateral upper extremity supported, During functional activity Standing balance-Leahy Scale: Poor                             ADL either performed or assessed with clinical judgement   ADL Overall ADL's : Needs assistance/impaired Eating/Feeding: Set up;Bed level   Grooming: Set up;Sitting   Upper Body Bathing: Supervision/ safety;Sitting   Lower Body Bathing: Moderate assistance;Sit to/from stand   Upper Body Dressing : Supervision/safety   Lower Body Dressing: Maximal assistance;Sit to/from stand   Toilet Transfer: Moderate assistance;+2 for safety/equipment;Stand-pivot;Rolling walker (2 wheels)   Toileting- Clothing Manipulation and Hygiene: Moderate  assistance;Sitting/lateral lean;Sit to/from stand         General ADL Comments: Pt with noted weakness, dizziness in standing and pain from wounds. pt also with confusion that appears to have improved some since initial  presentation     Vision Baseline Vision/History: 0 No visual deficits Ability to See in Adequate Light: 0 Adequate Patient Visual Report: No change from baseline Vision Assessment?: No apparent visual deficits     Perception     Praxis      Pertinent Vitals/Pain Pain Assessment Pain Assessment: Faces Faces Pain Scale: Hurts little more Pain Location: generalized with movement, L LE (heel) Pain Descriptors / Indicators: Grimacing, Guarding Pain Intervention(s): Monitored during session     Hand Dominance Right   Extremity/Trunk Assessment Upper Extremity Assessment Upper Extremity Assessment: Generalized weakness   Lower Extremity Assessment Lower Extremity Assessment: Defer to PT evaluation   Cervical / Trunk Assessment Cervical / Trunk Assessment: Kyphotic   Communication Communication Communication: No difficulties   Cognition Arousal/Alertness: Awake/alert Behavior During Therapy: WFL for tasks assessed/performed Overall Cognitive Status: Impaired/Different from baseline Area of Impairment: Orientation, Attention, Safety/judgement, Problem solving, Awareness                 Orientation Level: Disoriented to, Situation Current Attention Level: Selective     Safety/Judgement: Decreased awareness of deficits Awareness: Emergent Problem Solving: Difficulty sequencing, Requires verbal cues, Requires tactile cues General Comments: able to answer month/year easily, some difficulty recalling situation for hospitalization and noted confusion when attempting to answer if pt had foley cath at home vs just placed in hospital. Follows directions consistently, pleasant     General Comments  On RA on entry, SpO2 WFL at rest though with activity noted at 83% on RA (after replacing O2 probe). Replaced 2 L O2 back in nose with rise to mid 90s quickly.    Exercises     Shoulder Instructions      Home Living Family/patient expects to be discharged to:: Private  residence Living Arrangements: Spouse/significant other Available Help at Discharge: Available 24 hours/day;Family Type of Home: House Home Access: Stairs to enter [employer] of Steps: 1   Home Layout: One level     Bathroom Shower/Tub: Occupational psychologist: Standard Bathroom Accessibility: Yes   Home Equipment: Conservation officer, nature (2 wheels);BSC/3in1;Rollator (4 wheels)      Lives With: Spouse    Prior Functioning/Environment Prior Level of Function : Independent/Modified Independent;Patient poor historian/Family not available             Mobility Comments: reports using RW "some" at home since DC from rehab ADLs Comments: pt reports he could dress and bathe self at home with a little assistance from spouse.        OT Problem List: Decreased strength;Decreased activity tolerance;Impaired balance (sitting and/or standing);Decreased cognition;Decreased safety awareness;Decreased knowledge of use of DME or AE;Pain      OT Treatment/Interventions: Self-care/ADL training;Therapeutic exercise;Energy conservation;DME and/or AE instruction;Therapeutic activities;Patient/family education    OT Goals(Current goals can be found in the care plan section) Acute Rehab OT Goals Patient Stated Goal: unsure if rehab vs home yet OT Goal Formulation: With patient Time For Goal Achievement: 12/30/21 Potential to Achieve Goals: Good  OT Frequency: Min 2X/week    Co-evaluation PT/OT/SLP Co-Evaluation/Treatment: Yes Reason for Co-Treatment: For patient/therapist safety;To address functional/ADL transfers   OT goals addressed during session: Strengthening/ROM      AM-PAC OT "6 Clicks" Daily Activity     Outcome Measure Help from  another person eating meals?: A Little Help from another person taking care of personal grooming?: A Little Help from another person toileting, which includes using toliet, bedpan, or urinal?: A Lot Help from another person bathing  (including washing, rinsing, drying)?: A Lot Help from another person to put on and taking off regular upper body clothing?: A Little Help from another person to put on and taking off regular lower body clothing?: A Lot 6 Click Score: 15   End of Session Equipment Utilized During Treatment: Gait belt;Rolling walker (2 wheels);Oxygen  Activity Tolerance: Patient tolerated treatment well Patient left: in bed;with call bell/phone within reach  OT Visit Diagnosis: Other abnormalities of gait and mobility (R26.89);Muscle weakness (generalized) (M62.81)                Time: 0370-9643 OT Time Calculation (min): 21 min Charges:  OT General Charges $OT Visit: 1 Visit OT Evaluation $OT Eval Moderate Complexity: 1 Mod  Malachy Chamber, OTR/L Acute Rehab Services Office: [phone]   Layla Maw 12/16/2021, 9:31 AM

## 2021-12-16 NOTE — Evaluation (Signed)
Physical Therapy Evaluation Patient Details Name: Justin [name] MRN: [id number] DOB: 1955-02-12 Today's Date: 12/16/2021  History of Present Illness  Pt is a 66 y/o male who admitted for acute hypoxemic respiratory failure and acute metabolic encephalopathy in setting of UTI. PMH: a fib, DM2, HTN, CHF, recent L hip sx.  Clinical Impression   Pt presents with generalized weakness, impaired activity tolerance, impaired balance, AMS, and decreased activity tolerance. Pt to benefit from acute PT to address deficits. Pt requiring mod +2 assist for transfer-level mobility at this time, pt limited by dizziness which pt described as room-spinning (18 point drop in BP from supine to sitting, recovered to [facility] with standing). PT anticipates pt will need ST-SNF to address deficits and return to [facility] post-acutely. PT to progress mobility as tolerated, and will continue to follow acutely.         Recommendations for follow up therapy are one component of a multi-disciplinary discharge planning process, led by the attending physician.  Recommendations may be updated based on patient status, additional functional criteria and insurance authorization.  Follow Up Recommendations Skilled nursing-short term rehab (<3 hours/day) Can patient physically be transported by private vehicle: Yes    Assistance Recommended at Discharge Frequent or constant Supervision/Assistance  Patient can return home with the following  A lot of help with walking and/or transfers;A lot of help with bathing/dressing/bathroom    Equipment Recommendations None recommended by PT  Recommendations for Other Services       Functional Status Assessment Patient has had a recent decline in their functional status and demonstrates the ability to make significant improvements in function in a reasonable and predictable amount of time.     Precautions / Restrictions Precautions Precautions: Fall;Other (comment) Precaution Comments:  Wounds on L heel and sacral area, monitor orthostatics, O2 Restrictions Weight Bearing Restrictions: Yes LLE Weight Bearing: Weight bearing as tolerated      Mobility  Bed Mobility Overal bed mobility: Needs Assistance Bed Mobility: Supine to Sit, Sit to Supine     Supine to sit: Mod assist, +2 for safety/equipment Sit to supine: Mod assist, +2 for safety/equipment   General bed mobility comments: Mod A x 2 for safety in advancing BLE off of stretcher and lifting trunk. slow moving but assisting where he can. Assist for BLE back into bed and safely guiding trunk back to bed    Transfers Overall transfer level: Needs assistance Equipment used: Rolling walker (2 wheels) Transfers: Sit to/from Stand, Bed to chair/wheelchair/BSC Sit to Stand: Mod assist, +2 safety/equipment   Step pivot transfers: Mod assist, +2 safety/equipment       General transfer comment: Mod A x 2 to stand from stretcher with B feet blocking to prevent sliding. slow to rise and assume upright posture, lateral stepping x3    Ambulation/Gait                  Stairs            Wheelchair Mobility    Modified Rankin (Stroke Patients Only)       Balance Overall balance assessment: Needs assistance Sitting-balance support: No upper extremity supported, Feet supported Sitting balance-Leahy Scale: Fair     Standing balance support: Bilateral upper extremity supported, During functional activity Standing balance-Leahy Scale: Poor                               Pertinent Vitals/Pain Pain Assessment Pain Assessment: Faces  Faces Pain Scale: Hurts little more Pain Location: generalized with movement, L LE (heel), buttocks Pain Descriptors / Indicators: Grimacing, Guarding Pain Intervention(s): Limited activity within patient's tolerance, Monitored during session, Repositioned    Home Living Family/patient expects to be discharged to:: Private residence Living Arrangements:  Spouse/significant other Available Help at Discharge: Available 24 hours/day;Family Type of Home: House Home Access: Stairs to enter   Technical brewer of Steps: 1   Home Layout: One level Home Equipment: Conservation officer, nature (2 wheels);BSC/3in1;Rollator (4 wheels)      Prior Function Prior Level of Function : Independent/Modified Independent;Patient poor historian/Family not available             Mobility Comments: reports using RW "some" at home since DC from rehab ADLs Comments: pt reports he could dress and bathe self at home with a little assistance from spouse.     Hand Dominance   Dominant Hand: Right    Extremity/Trunk Assessment   Upper Extremity Assessment Upper Extremity Assessment: Defer to OT evaluation    Lower Extremity Assessment Lower Extremity Assessment: Generalized weakness    Cervical / Trunk Assessment Cervical / Trunk Assessment: Kyphotic  Communication   Communication: No difficulties  Cognition Arousal/Alertness: Awake/alert Behavior During Therapy: WFL for tasks assessed/performed Overall Cognitive Status: Impaired/Different from baseline Area of Impairment: Orientation, Attention, Safety/judgement, Problem solving, Awareness                 Orientation Level: Disoriented to, Situation Current Attention Level: Selective     Safety/Judgement: Decreased awareness of deficits Awareness: Emergent Problem Solving: Difficulty sequencing, Requires verbal cues, Requires tactile cues General Comments: able to answer month/year easily, some difficulty recalling situation for hospitalization and noted confusion when attempting to answer if pt had foley cath at home vs just placed in hospital. Follows directions consistently, pleasant        General Comments General comments (skin integrity, edema, etc.): SPO2 83% on RA after activity, replaced 2LO2 with recovery to 90s. sacral and L heel wound    Exercises     Assessment/Plan    PT  Assessment Patient needs continued PT services  PT Problem List Decreased strength;Decreased mobility;Decreased safety awareness;Decreased activity tolerance;Decreased balance;Decreased knowledge of use of DME;Pain;Decreased skin integrity       PT Treatment Interventions DME instruction;Therapeutic activities;Gait training;Therapeutic exercise;Patient/family education;Balance training;Stair training;Functional mobility training;Neuromuscular re-education    PT Goals (Current goals can be found in the Care Plan section)  Acute Rehab PT Goals PT Goal Formulation: With patient Time For Goal Achievement: 12/30/21 Potential to Achieve Goals: Good    Frequency Min 2X/week     Co-evaluation   Reason for Co-Treatment: For patient/therapist safety;To address functional/ADL transfers   OT goals addressed during session: Strengthening/ROM       AM-PAC PT "6 Clicks" Mobility  Outcome Measure Help needed turning from your back to your side while in a flat bed without using bedrails?: A Little Help needed moving from lying on your back to sitting on the side of a flat bed without using bedrails?: A Lot Help needed moving to and from a bed to a chair (including a wheelchair)?: A Lot Help needed standing up from a chair using your arms (e.g., wheelchair or bedside chair)?: A Lot Help needed to walk in hospital room?: A Lot Help needed climbing 3-5 steps with a railing? : Total 6 Click Score: 12    End of Session Equipment Utilized During Treatment: Gait belt;Oxygen Activity Tolerance: Patient tolerated treatment well;Patient limited by  pain Patient left: in bed;with call bell/phone within reach;Other (comment) (no bed alarm in ED) Nurse Communication: Mobility status PT Visit Diagnosis: Other abnormalities of gait and mobility (R26.89);Muscle weakness (generalized) (M62.81)    Time: 8006-3494 PT Time Calculation (min) (ACUTE ONLY): 22 min   Charges:   PT Evaluation $PT Eval Low  Complexity: 1 Low         Artemisia Auvil S, PT DPT Acute Rehabilitation Services Pager [phone]  Office [phone]  [address] Ruffin Pyo 12/16/2021, 10:10 AM

## 2021-12-16 NOTE — ED Notes (Signed)
Patient up and sitting at side of bed. Provided with call light and patient educated on notifying RN when ready to lay down in bed. Door and window left open to easy viewing of patient

## 2021-12-16 NOTE — ED Notes (Signed)
X RAY at bedside 

## 2021-12-16 NOTE — Progress Notes (Signed)
Pt received in room 6N06. Alert and oriented to person and place. In no apparent distress at this time. Vitals taken. Skin assessment performed. Stage II pressure ulcer noted to sacrum. DTI noted to left heel. Redness to BLEs with 3+ edema. Dry scaly areas of noted to BUEs and head. Foam dressing applied to areas and pt made comfortable.

## 2021-12-16 NOTE — ED Notes (Signed)
Pt had a small formed bowel movement.  Incontinence care completed. New Mepilex applied to sacrum, skin breakdown noted on right side buttocks.  Pt repositioned in bed, turned onto left side with pillow support.  Heels floated and blankets under elbows.

## 2021-12-17 ENCOUNTER — Inpatient Hospital Stay (HOSPITAL_COMMUNITY): Payer: Medicare Other

## 2021-12-17 DIAGNOSIS — D473 Essential (hemorrhagic) thrombocythemia: Secondary | ICD-10-CM

## 2021-12-17 DIAGNOSIS — S31809A Unspecified open wound of unspecified buttock, initial encounter: Secondary | ICD-10-CM | POA: Diagnosis not present

## 2021-12-17 DIAGNOSIS — G9341 Metabolic encephalopathy: Secondary | ICD-10-CM

## 2021-12-17 DIAGNOSIS — N3000 Acute cystitis without hematuria: Secondary | ICD-10-CM | POA: Diagnosis not present

## 2021-12-17 DIAGNOSIS — N3001 Acute cystitis with hematuria: Secondary | ICD-10-CM

## 2021-12-17 DIAGNOSIS — J9601 Acute respiratory failure with hypoxia: Secondary | ICD-10-CM

## 2021-12-17 LAB — CBC WITH DIFFERENTIAL/PLATELET
Abs Immature Granulocytes: 1.44 10*3/uL — ABNORMAL HIGH (ref 0.00–0.07)
Basophils Absolute: 0.5 10*3/uL — ABNORMAL HIGH (ref 0.0–0.1)
Basophils Relative: 2 %
Eosinophils Absolute: 0.1 10*3/uL (ref 0.0–0.5)
Eosinophils Relative: 0 %
HCT: 30.5 % — ABNORMAL LOW (ref 39.0–52.0)
Hemoglobin: 10.4 g/dL — ABNORMAL LOW (ref 13.0–17.0)
Immature Granulocytes: 5 %
Lymphocytes Relative: 3 %
Lymphs Abs: 0.8 10*3/uL (ref 0.7–4.0)
MCH: 39.8 pg — ABNORMAL HIGH (ref 26.0–34.0)
MCHC: 34.1 g/dL (ref 30.0–36.0)
MCV: 116.9 fL — ABNORMAL HIGH (ref 80.0–100.0)
Monocytes Absolute: 1.5 10*3/uL — ABNORMAL HIGH (ref 0.1–1.0)
Monocytes Relative: 5 %
Neutro Abs: 26.7 10*3/uL — ABNORMAL HIGH (ref 1.7–7.7)
Neutrophils Relative %: 85 %
Platelets: 293 10*3/uL (ref 150–400)
RBC: 2.61 MIL/uL — ABNORMAL LOW (ref 4.22–5.81)
RDW: 26.8 % — ABNORMAL HIGH (ref 11.5–15.5)
WBC: 31 10*3/uL — ABNORMAL HIGH (ref 4.0–10.5)
nRBC: 0.5 % — ABNORMAL HIGH (ref 0.0–0.2)

## 2021-12-17 MED ORDER — FUROSEMIDE 10 MG/ML IJ SOLN
40.0000 mg | Freq: Every day | INTRAMUSCULAR | Status: DC
  Administered 2021-12-17: 40 mg via INTRAVENOUS
  Filled 2021-12-17: qty 4

## 2021-12-17 MED ORDER — SODIUM CHLORIDE 0.9 % IV SOLN
2.0000 g | Freq: Three times a day (TID) | INTRAVENOUS | Status: DC
  Administered 2021-12-17 – 2021-12-18 (×3): 2 g via INTRAVENOUS
  Filled 2021-12-17 (×3): qty 12.5

## 2021-12-17 MED ORDER — FUROSEMIDE 10 MG/ML IJ SOLN: 40.0000 mg | Freq: Two times a day (BID) | INTRAMUSCULAR | Status: DC

## 2021-12-17 MED ORDER — VANCOMYCIN HCL 1250 MG/250ML IV SOLN
1250.0000 mg | Freq: Once | INTRAVENOUS | Status: AC
  Administered 2021-12-17: 1250 mg via INTRAVENOUS
  Filled 2021-12-17: qty 250

## 2021-12-17 MED ORDER — CHLORHEXIDINE GLUCONATE CLOTH 2 % EX PADS
6.0000 | MEDICATED_PAD | Freq: Every day | CUTANEOUS | Status: DC
  Administered 2021-12-17 – 2021-12-24 (×8): 6 via TOPICAL

## 2021-12-17 MED ORDER — MEDIHONEY WOUND/BURN DRESSING EX PSTE
1.0000 | PASTE | Freq: Every day | CUTANEOUS | Status: DC
  Administered 2021-12-17 – 2021-12-24 (×7): 1 via TOPICAL
  Filled 2021-12-17 (×2): qty 44

## 2021-12-17 MED ORDER — ADULT MULTIVITAMIN W/MINERALS CH
1.0000 | ORAL_TABLET | Freq: Every day | ORAL | Status: DC
  Administered 2021-12-17 – 2021-12-24 (×8): 1 via ORAL
  Filled 2021-12-17 (×8): qty 1

## 2021-12-17 MED ORDER — PROSOURCE PLUS PO LIQD
30.0000 mL | Freq: Two times a day (BID) | ORAL | Status: DC
  Administered 2021-12-17 – 2021-12-24 (×14): 30 mL via ORAL
  Filled 2021-12-17 (×14): qty 30

## 2021-12-17 MED ORDER — VANCOMYCIN HCL 750 MG/150ML IV SOLN
750.0000 mg | Freq: Two times a day (BID) | INTRAVENOUS | Status: DC
  Administered 2021-12-18: 750 mg via INTRAVENOUS
  Filled 2021-12-17 (×2): qty 150

## 2021-12-17 NOTE — Progress Notes (Addendum)
Pharmacy Antibiotic Note  Justin [name] is a 66 y.o. male admitted on 12/15/2021 with possible pneumonia.  Pharmacy has been consulted for vancomycin dosing. Patient is also on cefepime.   WBC wnl, afeb. On 2L O2. CT chest with moderate bilateral pleural effusions, coexistence pneumonia within the lower lobes is not excluded. 11/10/21 MRSA PCR was negative. Per MD, repeat MRSA PCR, if negative again then can discontinue vancomycin.  11/29 Vancomycin 750 mg Q 12 hr Scr used: 1 mg/dL Weight: 73.6 kg Vd coeff: 0.72 L/kg Est AUC: 472  Plan: Vancomycin '1250mg'$  x1 then '750mg'$  Q12 hr  Continue cefepime per team - increased to 2g Q 8 hr  F/u MRSA swab and discontinue vancomycin if negative Monitor cultures, clinical status, renal function, vancomycin level Narrow abx as able and f/u duration    Height: '5\' 7"'$  (170.2 cm) Weight: 73.6 kg (162 lb 4.1 oz) IBW/kg (Calculated) : 66.1  Temp (24hrs), Avg:97.8 F (36.6 C), Min:97.2 F (36.2 C), Max:98.5 F (36.9 C)  Recent Labs  Lab 12/15/21 1419 12/15/21 1730 12/15/21 1950 12/16/21 0330 12/17/21 1052  WBC 29.0*  --   --  29.2* 31.0*  CREATININE 1.01  --   --   --   --   LATICACIDVEN  --  1.3 1.1  --   --     Estimated Creatinine Clearance: 66.4 mL/min (by C-G formula based on SCr of 1.01 mg/dL).    Allergies  Allergen Reactions   Codeine Swelling and Other (See Comments)    Tolerates hydrocodone, tramadol, and oxycodone    Penicillins Anaphylaxis    Childhood allergy   Dilaudid [Hydromorphone] Anxiety    Hallucinations    Doxycycline Itching and Rash    Antimicrobials this admission: CTX 11/27 >> 11/28 Vanc 11/29 >> Cefepime 11/29>>    Dose adjustments this admission: N/a  Microbiology results: 11/29 MRSA: pend 11/27 BCx: ngtd 11/27 UCx: sent   Thank you for allowing pharmacy to be a part of this patient's care.  Benetta Spar, PharmD, BCPS, BCCP Clinical Pharmacist  Please check AMION for all [place] phone  numbers After 10:00 PM, call [place] [phone]

## 2021-12-17 NOTE — TOC Initial Note (Signed)
Transition of Care [facility]) - Initial/Assessment Note    Patient Details  Name: Justin [name] MRN: [id number] Date of Birth: 1955-12-18  Transition of Care [facility]) CM/SW Contact:    Milas Gain, [place] Phone Number: 12/17/2021, 12:14 PM  Clinical Narrative:                  CSW received consult for possible SNF placement at time of discharge. CSW spoke with patient regarding PT recommendation of SNF placement at time of discharge. Patient request for CSW to call his spouse regarding his dc plans. CSW called and spoke with patients spouse Justin [name]. Justin [name] informed CSW patient comes from home with her.Patients spouse  reports that she is currently unable to care for patient at their home given patient's current physical needs and fall risk. Patients spouse expressed understanding of PT recommendation and is agreeable to SNF placement for patient  at time of discharge. Patients spouse gave CSW permission to fax out initial referral near the [place] area for possible SNF placement. CSW discussed insurance authorization process and will provide Medicare SNF ratings list with accepted SNF bed offers to patients spouse when available. Patients spouse reports patient is COVID vaccinated.  No further questions reported at this time. CSW to continue to follow and assist with discharge planning needs.   Expected Discharge Plan: Skilled Nursing Facility Barriers to Discharge: Continued Medical Work up   Patient Goals and CMS Choice Patient states their goals for this hospitalization and ongoing recovery are:: SNF CMS Medicare.gov Compare Post Acute Care list provided to:: Patient Represenative (must comment) (patients spouse Justin [name]) Choice offered to / list presented to : Spouse  Expected Discharge Plan and Services Expected Discharge Plan: [place] In-house Referral: Clinical Social Work     Living arrangements for the past 2 months: Single Family Home                                       Prior Living Arrangements/Services Living arrangements for the past 2 months: Single Family Home Lives with:: Spouse Patient language and need for interpreter reviewed:: Yes Do you feel safe going back to the place where you live?: No   SNF  Need for Family Participation in Patient Care: Yes (Comment) Care giver support system in place?: Yes (comment)   Criminal Activity/Legal Involvement Pertinent to Current Situation/Hospitalization: No - Comment as needed  Activities of Daily Living Home Assistive Devices/Equipment: None ADL Screening (condition at time of admission) Patient's cognitive ability adequate to safely complete daily activities?: No Is the patient deaf or have difficulty hearing?: No Does the patient have difficulty seeing, even when wearing glasses/contacts?: No Does the patient have difficulty concentrating, remembering, or making decisions?: Yes Patient able to express need for assistance with ADLs?: Yes Does the patient have difficulty dressing or bathing?: Yes Independently performs ADLs?: No Does the patient have difficulty walking or climbing stairs?: Yes Weakness of Legs: Both Weakness of Arms/Hands: None  Permission Sought/Granted Permission sought to share information with : Case Manager, Family Supports, Customer service manager Permission granted to share information with : Yes, Verbal Permission Granted  Share Information with NAME: Justin [name]  Permission granted to share info w AGENCY: SNF  Permission granted to share info w Relationship: spouse  Permission granted to share info w Contact Information: Justin [name] [phone]  Emotional Assessment Appearance:: Appears stated age Attitude/Demeanor/Rapport: Gracious Affect (typically observed): Calm Orientation: :  Oriented to Self, Oriented to Place, Oriented to  Time, Oriented to Situation Alcohol / Substance Use: Not Applicable Psych Involvement: No (comment)  Admission  diagnosis:  UTI (urinary tract infection) [N39.0] Weakness [R53.1] Acute cystitis with hematuria [N30.01] Wound of buttock, unspecified laterality, initial encounter [S31.809A] Patient Active Problem List   Diagnosis Date Noted   Acute hypoxemic respiratory failure ([place]) 12/16/2021   Stage II pressure ulcer ([place]) 12/16/2021   UTI (urinary tract infection) 12/15/2021   Pressure injury of skin 11/21/2021   Physical deconditioning 11/19/2021   Status post total hip replacement, left 11/19/2021   Moderate malnutrition ([place]) 11/12/2021   Dural venous sinus thrombosis 11/10/2021   Diuretic-induced hypokalemia 11/10/2021   BPH with obstruction/lower urinary tract symptoms 11/09/2021   Elevated INR    Chronic systolic CHF (congestive heart failure) ([place]) 11/07/2021   Protein-calorie malnutrition, severe ([place]) 38/18/2993   Acute metabolic encephalopathy 71/69/6789   Mixed diabetic hyperlipidemia associated with type 2 diabetes mellitus ([place]) 11/05/2021   Hypomagnesemia 11/05/2021   S/P total left hip arthroplasty 11/04/2021   Acute postoperative anemia due to expected blood loss 11/04/2021   Mixed hyperlipidemia 07/08/2020   AKI (acute kidney injury) ([place]) 10/04/2019   Closed displaced trimalleolar fracture of right ankle 10/04/2019   Peritoneal hemorrhage 10/04/2019   Acute urinary retention 10/04/2019   Fall 10/04/2019   Status post right hip replacement 12/23/2018   PAD (peripheral artery disease) ([place]) 09/20/2018   Melena    Acute gastric ulcer with hemorrhage    Symptomatic anemia 08/10/2018   Chronic anemia 08/10/2018   GI bleed 08/10/2018   Fatty liver    Normocytic anemia 07/19/2018   History of CEA (carotid endarterectomy) 02/09/2018   History of tobacco use 02/02/2018   Carotid stenosis 01/25/2018   S/P CABG x 4 01/03/2018   S/P aortic valve replacement with bioprosthetic valve 01/03/2018   Coronary artery disease involving native heart without angina pectoris 01/03/2018    Essential thrombocythemia ([place]) 12/21/2017   Bicuspid aortic valve determined by imaging 12/09/2017   Claudication ([place]) 12/09/2017   Sleep apnea 12/09/2017   Left ventricular systolic dysfunction without heart failure 12/09/2017   Erythrocytosis 11/19/2017   Leukocytosis 11/19/2017   Atrial fibrillation, chronic ([place]) 11/12/2017   Essential hypertension 10/19/2013   Pulmonary infiltrates 09/27/2013   Cough 09/27/2013   PCP:  [name], MD Pharmacy:   [facility] [id number], [place] [address] [place] [address] Phone: [phone] Fax: [phone]  [facility] [address] [place] [address] Phone: [phone] Fax: [phone]     Social Determinants of Health (SDOH) Interventions    Readmission Risk Interventions    11/06/2021    9:35 AM  Readmission Risk Prevention Plan  Transportation Screening Complete  PCP or Specialist Appt within 3-5 Days Complete  HRI or [place] Complete  Social Work Consult for [place] Planning/Counseling Complete  Palliative Care Screening Not Applicable  Medication Review [employer]) Complete

## 2021-12-17 NOTE — Care Management (Signed)
  Transition of Care [facility]) Screening Note   Patient Details  Name: Justin [name] Date of Birth: 1955/08/07   Transition of Care [facility]) CM/SW Contact:    Carles Collet, RN Phone Number: 12/17/2021, 8:01 AM    Transition of Care Department [facility]) has reviewed patient is following patient advancement through interdisciplinary progression rounds. If new patient transition needs arise, please place a TOC consult.  Patient from home w AMS, patient is active with [place]

## 2021-12-17 NOTE — Plan of Care (Signed)
  Problem: Clinical Measurements: Goal: Respiratory complications will improve Outcome: Progressing   Problem: Coping: Goal: Level of anxiety will decrease Outcome: Progressing   

## 2021-12-17 NOTE — Progress Notes (Signed)
[facility] PROGRESS NOTE    Progress Note  Justin [name]  TGG:[id number] DOB: 02-Apr-1955 DOA: 12/15/2021 PCP: [name], MD     Brief Narrative:   Justin [name] is an 66 y.o. male past medical history of essential thrombocythemia, chronic atrial fibrillation on Eliquis, dural venous thrombosis, aortic stenosis with a history of aortic valve replacement, CAD status post CABG, essential hypertension, diabetes mellitus type 2 chronic systolic heart failure BPH with a left total hip replacement on November 04, 2021 comes in to the ED for generalized weakness confusion for 1 week, he was also noted to have a pressure ulcer on the left buttock and heel, white count of 29,000 CT of the head showed no acute findings.    Assessment/Plan:   Acute respiratory failure with hypoxia: Currently requiring 2 L of oxygen to keep saturations greater than 90%. He was complaining of shortness of breath on admission BNP 1700, chest x-ray shows mild pulmonary edema. Neck veins are not appreciated, mild lower extremity edema, lungs are clear. Get a CT of the chest, he is lost over 20 pounds used to be a smoker, unlikely a PE as he has been on Eliquis.  Leukocytosis: Of unclear source, CT of the head showed no acute findings. CT venogram is unchanged from previous. UA showed pyuria he was started on Rocephin. Blood cultures and urine cultures have been sent. CBC with differential is pending this morning. Chest x-ray showed moderate interstitial edema. He was complaining of shortness of breath on admission, he is requiring 2 L of oxygen to keep saturations greater than 90%. Will get a CT of the chest  Acute metabolic encephalopathy: Of unclear etiology, possibly due to hypoxia now resolved. Able to carry on a conversation without any difficulties and remember everything that has passed over the last 48 hours  Possible acute on chronic systolic heart failure: Neck veins are down,  lower extremity edema, BNP is 1700, chest x-ray showed mild pulmonary edema. Start him on IV Lasix monitor strict I's and O's and daily weights, monitor and replete electrolytes.  Chronic fibrillation: Continue Eliquis and metoprolol.  Intravenous sinus thrombosis: CT of the head unchanged. Continue Eliquis.  Essential thrombocythemia: Continue Hydrea  Stage II pressure decubitus ulcer and left heel: Continue wound care. RN Pressure Injury Documentation: Pressure Injury 11/17/21 Heel Left Unstageable - Full thickness tissue loss in which the base of the injury is covered by slough (yellow, tan, gray, green or brown) and/or eschar (tan, brown or black) in the wound bed. pressure injury (Active)  11/17/21 0612  Location: Heel  Location Orientation: Left  Staging: Unstageable - Full thickness tissue loss in which the base of the injury is covered by slough (yellow, tan, gray, green or brown) and/or eschar (tan, brown or black) in the wound bed.  Wound Description (Comments): pressure injury  Present on Admission: Yes  Dressing Type Foam - Lift dressing to assess site every shift 12/16/21 2024     Pressure Injury 11/19/21 Coccyx Mid;Lower Stage 2 -  Partial thickness loss of dermis presenting as a shallow open injury with a red, pink wound bed without slough. (Active)  11/19/21 1625  Location: Coccyx  Location Orientation: Mid;Lower  Staging: Stage 2 -  Partial thickness loss of dermis presenting as a shallow open injury with a red, pink wound bed without slough.  Wound Description (Comments):   Present on Admission: Yes  Dressing Type Foam - Lift dressing to assess site every shift 12/16/21 2024  Pressure Injury 11/19/21 Sacrum Left Stage 2 -  Partial thickness loss of dermis presenting as a shallow open injury with a red, pink wound bed without slough. (Active)  11/19/21 1625  Location: Sacrum  Location Orientation: Left  Staging: Stage 2 -  Partial thickness loss of dermis  presenting as a shallow open injury with a red, pink wound bed without slough.  Wound Description (Comments):   Present on Admission: Yes  Dressing Type Foam - Lift dressing to assess site every shift 12/16/21 2024     Pressure Injury 11/19/21 Sacrum Right Stage 2 -  Partial thickness loss of dermis presenting as a shallow open injury with a red, pink wound bed without slough. (Active)  11/19/21 1625  Location: Sacrum  Location Orientation: Right  Staging: Stage 2 -  Partial thickness loss of dermis presenting as a shallow open injury with a red, pink wound bed without slough.  Wound Description (Comments):   Present on Admission: Yes  Dressing Type None 11/29/21 2045    DVT prophylaxis: Eliquis Family Communication: None Status is: Inpatient Remains inpatient appropriate because: Acute respiratory failure with hypoxia of unclear etiology    Code Status:     Code Status Orders  (From admission, onward)           Start     Ordered   12/16/21 0046  Do not attempt resuscitation (DNR)  Continuous       Question Answer Comment  In the event of cardiac or respiratory ARREST Do not call a "code blue"   In the event of cardiac or respiratory ARREST Do not perform Intubation, CPR, defibrillation or ACLS   In the event of cardiac or respiratory ARREST Use medication by any route, position, wound care, and other measures to relive pain and suffering. May use oxygen, suction and manual treatment of airway obstruction as needed for comfort.      12/16/21 0049           Code Status History     Date Active Date Inactive Code Status Order ID Comments User Context   11/19/2021 1532 11/30/2021 1527 Full Code [id number]  Elizabeth Sauer Inpatient   11/19/2021 1532 11/19/2021 1532 DNR [id number]  Cathlyn Parsons, PA-C Inpatient   11/11/2021 1629 11/19/2021 1509 DNR [id number]  Vernelle Emerald, MD Inpatient   11/04/2021 1707 11/11/2021 1629 Full Code [id number]  Irving Copas, PA-C Inpatient   08/18/2021 2015 08/22/2021 1938 Full Code [id number]  Kayleen Memos, DO ED   10/04/2019 2258 10/12/2019 2204 Full Code [id number]  Stark Klein, MD ED   12/23/2018 1823 12/24/2018 2142 Full Code [id number]  Norman Herrlich Inpatient   08/10/2018 0059 08/13/2018 2201 Full Code [id number]  Etta Quill, DO ED   01/03/2018 1913 01/10/2018 1713 Full Code [id number]  Nani Skillern, PA-C Inpatient   12/24/2017 1101 12/24/2017 1552 Full Code [id number]  Bensimhon, Shaune Pascal, MD Inpatient         IV Access:   Peripheral IV   Procedures and diagnostic studies:   DG CHEST PORT 1 VIEW  Result Date: 12/16/2021 CLINICAL DATA:  Hypoxia. EXAM: PORTABLE CHEST 1 VIEW COMPARISON:  November 12, 2021 FINDINGS: Multiple sternal wires are noted. There is stable mild to moderate severity enlargement of the cardiac silhouette. There is moderate to marked severity calcification of the aortic arch. Moderate severity diffusely increased interstitial lung markings are noted. Mild atelectasis is seen within the bilateral lung bases. There is no  evidence of a pleural effusion or pneumothorax. The visualized skeletal structures are unremarkable. IMPRESSION: 1. Evidence of prior median sternotomy. 2. Stable cardiomegaly with moderate severity interstitial edema and mild bibasilar atelectasis. Electronically Signed   By: Virgina Norfolk M.D.   On: 12/16/2021 01:07   CT VENOGRAM HEAD  Result Date: 12/15/2021 CLINICAL DATA:  History of dural venous sinus thrombosis. Altered mental status. EXAM: CT VENOGRAM HEAD TECHNIQUE: Venographic phase images of the brain were obtained following the administration of intravenous contrast. Multiplanar reformats and maximum intensity projections were generated. RADIATION DOSE REDUCTION: This exam was performed according to the departmental dose-optimization program which includes automated exposure control, adjustment of the mA and/or kV according to patient size  and/or use of iterative reconstruction technique. CONTRAST:  41m OMNIPAQUE IOHEXOL 350 MG/ML SOLN COMPARISON:  11/10/2021 FINDINGS: Superior sagittal sinus: Normal. Straight sinus: Normal. Inferior sagittal sinus, vein of [place] and internal cerebral veins: Normal. Transverse sinuses: Normal. Sigmoid sinuses: Unchanged filling defect within the left sigmoid sinus that extends into the left internal jugular vein. The right sigmoid sinus is normal. Visualized jugular veins: Unchanged left internal jugular vein filling defect. Right side is normal. IMPRESSION: Unchanged venous thrombosis within the left sigmoid sinus that extends into the left internal jugular vein. Electronically Signed   By: KUlyses JarredM.D.   On: 12/15/2021 19:54   CT Head Wo Contrast  Result Date: 12/15/2021 CLINICAL DATA:  Altered mental status EXAM: CT HEAD WITHOUT CONTRAST TECHNIQUE: Contiguous axial images were obtained from the base of the skull through the vertex without intravenous contrast. RADIATION DOSE REDUCTION: This exam was performed according to the departmental dose-optimization program which includes automated exposure control, adjustment of the mA and/or kV according to patient size and/or use of iterative reconstruction technique. COMPARISON:  11/10/2021 FINDINGS: Brain: There is no mass, hemorrhage or extra-axial collection. The size and configuration of the ventricles and extra-axial CSF spaces are normal. There is hypoattenuation of the white matter, most commonly indicating chronic small vessel disease. Vascular: Atherosclerotic calcification of the internal carotid arteries at the skull base. No abnormal hyperdensity of the major intracranial arteries or dural venous sinuses. Skull: The visualized skull base, calvarium and extracranial soft tissues are normal. Sinuses/Orbits: No fluid levels or advanced mucosal thickening of the visualized paranasal sinuses. No mastoid or middle ear effusion. The orbits are normal.  IMPRESSION: 1. No acute intracranial abnormality. 2. Chronic small vessel disease. Electronically Signed   By: KUlyses JarredM.D.   On: 12/15/2021 19:50     Medical Consultants:   None.   Subjective:    JMarlow BerenguerPerper relates that shortness of breath is unchanged.  Objective:    Vitals:   12/16/21 1758 12/16/21 2024 12/17/21 0104 12/17/21 0541  BP: 98/65 97/72 (!) 104/59 (!) 103/58  Pulse: 92 91 94 82  Resp:  '18 17 16  '$ Temp: 97.7 F (36.5 C) 97.6 F (36.4 C) 98.5 F (36.9 C) 97.7 F (36.5 C)  TempSrc: Oral Oral Oral Oral  SpO2:  100% 94% 96%  Weight:      Height:       SpO2: 96 % O2 Flow Rate (L/min): 2 L/min   Intake/Output Summary (Last 24 hours) at 12/17/2021 0725 Last data filed at 12/17/2021 0651 Gross per 24 hour  Intake 340 ml  Output 1350 ml  Net -1010 ml   Filed Weights   12/16/21 1753  Weight: 73.6 kg    Exam: General exam: In no acute distress. Respiratory system: Good  air movement and clear to auscultation Cardiovascular system: S1 & S2 heard, RRR. No JVD, positive hepatojugular reflex Gastrointestinal system: Abdomen is nondistended, soft and nontender.  Extremities: 1+ lower extremity edema Skin: No rashes, lesions or ulcers Psychiatry: Judgement and insight appear normal. Mood & affect appropriate.    Data Reviewed:    Labs: Basic Metabolic Panel: Recent Labs  Lab 12/15/21 1419 12/16/21 0330  NA 132*  --   K 3.7  --   CL 100  --   CO2 23  --   GLUCOSE 79  --   BUN 15  --   CREATININE 1.01  --   CALCIUM 8.0*  --   MG  --  1.7   GFR Estimated Creatinine Clearance: 66.4 mL/min (by C-G formula based on SCr of 1.01 mg/dL). Liver Function Tests: Recent Labs  Lab 12/15/21 1419  AST 26  ALT 15  ALKPHOS 145*  BILITOT 1.5*  PROT 6.3*  ALBUMIN 2.5*   No results for input(s): "LIPASE", "AMYLASE" in the last 168 hours. Recent Labs  Lab 12/15/21 1745  AMMONIA 31   Coagulation profile No results for input(s): "INR",  "PROTIME" in the last 168 hours. COVID-19 Labs  No results for input(s): "DDIMER", "FERRITIN", "LDH", "CRP" in the last 72 hours.  Lab Results  Component Value Date   SARSCOV2NAA NEGATIVE 10/12/2019   [place] NEGATIVE 10/04/2019   SARSCOV2NAA NOT DETECTED 12/20/2018   [place] NEGATIVE 08/10/2018    CBC: Recent Labs  Lab 12/15/21 1419 12/16/21 0330  WBC 29.0* 29.2*  NEUTROABS 25.7*  --   HGB 9.9* 9.7*  HCT 30.7* 29.3*  MCV 120.4* 119.6*  PLT 306 283   Cardiac Enzymes: Recent Labs  Lab 12/15/21 1419  CKTOTAL 64   BNP (last 3 results) No results for input(s): "PROBNP" in the last 8760 hours. CBG: No results for input(s): "GLUCAP" in the last 168 hours. D-Dimer: No results for input(s): "DDIMER" in the last 72 hours. Hgb A1c: No results for input(s): "HGBA1C" in the last 72 hours. Lipid Profile: No results for input(s): "CHOL", "HDL", "LDLCALC", "TRIG", "CHOLHDL", "LDLDIRECT" in the last 72 hours. Thyroid function studies: No results for input(s): "TSH", "T4TOTAL", "T3FREE", "THYROIDAB" in the last 72 hours.  Invalid input(s): "FREET3" Anemia work up: No results for input(s): "VITAMINB12", "FOLATE", "FERRITIN", "TIBC", "IRON", "RETICCTPCT" in the last 72 hours. Sepsis Labs: Recent Labs  Lab 12/15/21 1419 12/15/21 1730 12/15/21 1950 12/16/21 0330  WBC 29.0*  --   --  29.2*  LATICACIDVEN  --  1.3 1.1  --    Microbiology Recent Results (from the past 240 hour(s))  Blood culture (routine x 2)     Status: None (Preliminary result)   Collection Time: 12/15/21  5:25 PM   Specimen: BLOOD  Result Value Ref Range Status   Specimen Description BLOOD LEFT ANTECUBITAL  Final   Special Requests   Final    BOTTLES DRAWN AEROBIC AND ANAEROBIC Blood Culture results may not be optimal due to an excessive volume of blood received in culture bottles   Culture   Final    NO GROWTH 2 DAYS Performed at [place] [facility] [address] [place], [place] [address]     Report Status PENDING  Incomplete  Blood culture (routine x 2)     Status: None (Preliminary result)   Collection Time: 12/15/21  5:30 PM   Specimen: BLOOD  Result Value Ref Range Status   Specimen Description BLOOD RIGHT ANTECUBITAL  Final  Special Requests   Final    BOTTLES DRAWN AEROBIC AND ANAEROBIC Blood Culture adequate volume   Culture   Final    NO GROWTH 2 DAYS Performed at [facility], [place] [address] [place], [place] [address]    Report Status PENDING  Incomplete     Medications:    apixaban  5 mg Oral BID   Chlorhexidine Gluconate Cloth  6 each Topical Daily   cholecalciferol  5,000 Units Oral Daily   ferrous sulfate  325 mg Oral BID WC   furosemide  40 mg Oral Daily   hydroxyurea  500 mg Oral BID   pantoprazole  40 mg Oral BID   rosuvastatin  20 mg Oral Daily   sucralfate  1 g Oral BID   Continuous Infusions:  cefTRIAXone (ROCEPHIN)  IV Stopped (12/16/21 2115)      LOS: 1 day   Charlynne Cousins  [facility]  12/17/2021, 7:25 AM

## 2021-12-17 NOTE — Consult Note (Signed)
WOC Nurse Consult Note: Reason for Consult:Left heel unstageable pressure injury Stage 2 pressure injury to sacrococcygeal area. All present on admission.  Wound type: Pressure Injury POA: Yes Measurement:Left lateral aspect of heel:  3 cm x 4 cm fibrin to wound bed Coccyx and sacrum  3 cm x 6.6 cm x 0.1 cm  Wound bed: pink and moist Drainage (amount, consistency, odor)  minimal serosanguinous  Periwound:intact  moist at times Dressing procedure/placement/frequency: Cleanse wounds to left heel and sacrococcygeal area with NS and pat dry. Apply medihoney. Top with gauze and foam dressing. Change daily.  Will not follow at this time.  Please re-consult if needed.  Estrellita Ludwig MSN, RN, FNP-BC CWON Wound, Ostomy, Continence Nurse [facility] [phone] Pager [phone]

## 2021-12-17 NOTE — NC FL2 (Signed)
[place] LEVEL OF CARE SCREENING TOOL     IDENTIFICATION  Patient Name: Justin [name] Birthdate: 1955/02/18 Sex: male Admission Date (Current Location): 12/15/2021  [facility] and [place] Number:  Herbalist and Address:  The . [facility], [place] [address], [place], [place] [address]      Provider Number: [id number]  Attending Physician Name and Address:  Charlynne Cousins, MD  Relative Name and Phone Number:       Current Level of Care: Hospital Recommended Level of Care: [place] Prior Approval Number:    Date Approved/Denied:   PASRR Number: [id number]  Discharge Plan: SNF    Current Diagnoses: Patient Active Problem List   Diagnosis Date Noted   Acute hypoxemic respiratory failure ([place]) 12/16/2021   Stage II pressure ulcer ([place]) 12/16/2021   UTI (urinary tract infection) 12/15/2021   Pressure injury of skin 11/21/2021   Physical deconditioning 11/19/2021   Status post total hip replacement, left 11/19/2021   Moderate malnutrition ([place]) 11/12/2021   Dural venous sinus thrombosis 11/10/2021   Diuretic-induced hypokalemia 11/10/2021   BPH with obstruction/lower urinary tract symptoms 11/09/2021   Elevated INR    Chronic systolic CHF (congestive heart failure) ([place]) 11/07/2021   Protein-calorie malnutrition, severe ([place]) 76/73/4193   Acute metabolic encephalopathy 79/02/4095   Mixed diabetic hyperlipidemia associated with type 2 diabetes mellitus ([place]) 11/05/2021   Hypomagnesemia 11/05/2021   S/P total left hip arthroplasty 11/04/2021   Acute postoperative anemia due to expected blood loss 11/04/2021   Mixed hyperlipidemia 07/08/2020   AKI (acute kidney injury) ([place]) 10/04/2019   Closed displaced trimalleolar fracture of right ankle 10/04/2019   Peritoneal hemorrhage 10/04/2019   Acute urinary retention 10/04/2019   Fall 10/04/2019   Status post right hip replacement 12/23/2018   PAD (peripheral  artery disease) ([place]) 09/20/2018   Melena    Acute gastric ulcer with hemorrhage    Symptomatic anemia 08/10/2018   Chronic anemia 08/10/2018   GI bleed 08/10/2018   Fatty liver    Normocytic anemia 07/19/2018   History of CEA (carotid endarterectomy) 02/09/2018   History of tobacco use 02/02/2018   Carotid stenosis 01/25/2018   S/P CABG x 4 01/03/2018   S/P aortic valve replacement with bioprosthetic valve 01/03/2018   Coronary artery disease involving native heart without angina pectoris 01/03/2018   Essential thrombocythemia ([place]) 12/21/2017   Bicuspid aortic valve determined by imaging 12/09/2017   Claudication ([place]) 12/09/2017   Sleep apnea 12/09/2017   Left ventricular systolic dysfunction without heart failure 12/09/2017   Erythrocytosis 11/19/2017   Leukocytosis 11/19/2017   Atrial fibrillation, chronic ([place]) 11/12/2017   Essential hypertension 10/19/2013   Pulmonary infiltrates 09/27/2013   Cough 09/27/2013    Orientation RESPIRATION BLADDER Height & Weight     Self, Time, Situation, Place  Normal Continent (Urethral Catheter) Weight: 162 lb 4.1 oz (73.6 kg) Height:  '5\' 7"'$  (170.2 cm)  BEHAVIORAL SYMPTOMS/MOOD NEUROLOGICAL BOWEL NUTRITION STATUS      Continent Diet (Please see discharge summary)  AMBULATORY STATUS COMMUNICATION OF NEEDS Skin   Extensive Assist Verbally Other (Comment) (Wound/Incision LDAs,PI coccyx,mid,lower,stage 2,PRN,PI sacrum,L,stage 2,PRN,PI sacrum,R,stage 2,PI heel,L,unstageable,PRN,Incision closed,hip,L,wound incision open or dehiced venous stasis,ulcer,pretibial,L,mid see additional info)                       Personal Care Assistance Level of Assistance  Bathing, Feeding, Dressing Bathing Assistance: Maximum assistance Feeding assistance: Independent Dressing Assistance: Maximum assistance  Functional Limitations Info  Sight, Hearing, Speech Sight Info: Impaired Hearing Info: Impaired Speech Info: Adequate    SPECIAL CARE  FACTORS FREQUENCY  PT (By licensed PT), OT (By licensed OT)     PT Frequency: 5x min weekly OT Frequency: 5x min weekly            Contractures Contractures Info: Not present    Additional Factors Info  Code Status, Allergies Code Status Info: DNR Allergies Info: Codeine,Penicillins,Dilaudid (hydromorphone),Doxycycline           Current Medications (12/17/2021):  This is the current hospital active medication list Current Facility-Administered Medications  Medication Dose Route Frequency Provider Last Rate Last Admin   acetaminophen (TYLENOL) tablet 650 mg  650 mg Oral Q6H PRN Shela Leff, MD   650 mg at 12/17/21 0104   Or   acetaminophen (TYLENOL) suppository 650 mg  650 mg Rectal Q6H PRN Shela Leff, MD       apixaban (ELIQUIS) tablet 5 mg  5 mg Oral BID Oswald Hillock, MD   5 mg at 12/17/21 0815   ceFEPIme (MAXIPIME) 2 g in sodium chloride 0.9 % 100 mL IVPB  2 g Intravenous Q8H Charlynne Cousins, MD 200 mL/hr at 12/17/21 1210 2 g at 12/17/21 1210   Chlorhexidine Gluconate Cloth 2 % PADS 6 each  6 each Topical Daily Oswald Hillock, MD   6 each at 12/17/21 0541   cholecalciferol (VITAMIN D3) 25 MCG (1000 UNIT) tablet 5,000 Units  5,000 Units Oral Daily Oswald Hillock, MD   5,000 Units at 12/17/21 0816   docusate sodium (COLACE) capsule 100 mg  100 mg Oral Daily PRN Oswald Hillock, MD       ferrous sulfate tablet 325 mg  325 mg Oral BID WC Oswald Hillock, MD   325 mg at 12/17/21 0815   furosemide (LASIX) injection 40 mg  40 mg Intravenous Daily Charlynne Cousins, MD   40 mg at 12/17/21 0815   hydroxyurea (HYDREA) capsule 500 mg  500 mg Oral BID Oswald Hillock, MD   500 mg at 12/17/21 0815   leptospermum manuka honey ([place]) paste 1 Application  1 Application Topical Daily Charlynne Cousins, MD   1 Application at 00/92/33 1214   oxyCODONE (Oxy IR/ROXICODONE) immediate release tablet 5 mg  5 mg Oral Q6H PRN Oswald Hillock, MD   5 mg at 12/17/21 0539    pantoprazole (PROTONIX) EC tablet 40 mg  40 mg Oral BID Oswald Hillock, MD   40 mg at 12/17/21 0815   rosuvastatin (CRESTOR) tablet 20 mg  20 mg Oral Daily Oswald Hillock, MD   20 mg at 12/17/21 0816   sucralfate (CARAFATE) tablet 1 g  1 g Oral BID Oswald Hillock, MD   1 g at 12/17/21 0816   vancomycin (VANCOREADY) IVPB 1250 mg/250 mL  1,250 mg Intravenous Once Donnamae Jude, [facility]       [START ON 12/18/2021] vancomycin (VANCOREADY) IVPB 750 mg/150 mL  750 mg Intravenous Q12H Donnamae Jude, [facility]         Discharge Medications: Please see discharge summary for a list of discharge medications.  Relevant Imaging Results:  Relevant Lab Results:   Additional Information [id number],Wound incision open or dehiced venous stasis ulcer,ankle,anterior,L,Venous stasis,ulcer,foot,anterior,L,stasis ulcer,wound incision open or dehiced, Irritant dermatitis (MASD) (IAD),sacrum,mid,Erythema,leg,groin,penis,perineum,buttocks,sacrum,bilateral,Abrasion,bilateral  Milas Gain, LCSWA

## 2021-12-17 NOTE — Progress Notes (Signed)
Initial Nutrition Assessment  DOCUMENTATION CODES:   Severe malnutrition in context of chronic illness  INTERVENTION:   Multivitamin w/ minerals daily Liberalize pt to regular diet due to malnutrition and ongoing poor appetite Encourage good PO intake  Snacks between meals  30 ml ProSource Plus BID, each supplement provides 100 kcals and 15 grams protein.  Trial Magic Cup   NUTRITION DIAGNOSIS:   Severe Malnutrition related to chronic illness as evidenced by severe muscle depletion, severe fat depletion.  GOAL:   Patient will meet greater than or equal to 90% of their needs  MONITOR:   PO intake, Supplement acceptance, Labs, Weight trends, Skin  REASON FOR ASSESSMENT:   Malnutrition Screening Tool    ASSESSMENT:   66 y.o. male presented to the ED with generalized weakness, confusion, and slight slurring of speech. Pt recently discharged from [facility] after L total hip replacement. PMH includes HTN, CHF, PAD, s/p CABG x4, malnutrition, T2DM, GERD, and chronic A-Fib. Pt admitted with acute metabolic encephalopathy, likely secondary to UTI and acute hypoxemic respiratory failure.   Pt reports that his appetite is so-so and has not improved much since discharge. Reports that he knows that he is not eating as much. Reports that he eats eggs, bacon, juice, and smoothies. Pt reports that he has lost weight since leaving [facility], was unable to  [place] that he does not like Ensure or Boost Breeze. Explained the importance of getting adequate calories and protein in for wounds healing and prevention of lean muscle mass. Pt agreeable to trial two additional supplements, ProSource Plus and Magic Cup. RD to return tomorrow and see how he liked the supplements.   RD reviewed the menu with pt. Pt with no additional questions or concerns at this time.   Medications reviewed and include: Vitamin D3, Ferrous Sulfate, Lasix, Protonix, Sucralfate, IV antibiotics  Labs reviewed: Hgb A1c 5%  (11/05/21)  NUTRITION - FOCUSED PHYSICAL EXAM:  Flowsheet Row Most Recent Value  Orbital Region Severe depletion  Upper Arm Region Severe depletion  Thoracic and Lumbar Region Severe depletion  Buccal Region Moderate depletion  Temple Region Severe depletion  Clavicle Bone Region Severe depletion  Clavicle and Acromion Bone Region Severe depletion  Scapular Bone Region Severe depletion  Dorsal Hand Severe depletion  Patellar Region Moderate depletion  Anterior Thigh Region Moderate depletion  Posterior Calf Region Moderate depletion  Edema (RD Assessment) Moderate  Hair Reviewed  Eyes Reviewed  Mouth Reviewed  Skin Reviewed  Nails Reviewed   Diet Order:   Diet Order             Diet regular Room service appropriate? Yes; Fluid consistency: Thin  Diet effective now                   EDUCATION NEEDS:   Education needs have been addressed  Skin:  Skin Assessment: Reviewed RN Assessment (Multiple wounds)  Last BM:  11/28  Height:   Ht Readings from Last 1 Encounters:  12/16/21 '5\' 7"'$  (1.702 m)    Weight:   Wt Readings from Last 1 Encounters:  12/16/21 73.6 kg    Ideal Body Weight:  67.2 kg  BMI:  Body mass index is 25.41 kg/m.  Estimated Nutritional Needs:   Kcal:  2000-2200  Protein:  100-115 grams  Fluid:  >/= 2 L    Hermina Barters RD, LDN Clinical Dietitian See [facility] for contact information.

## 2021-12-18 ENCOUNTER — Inpatient Hospital Stay (HOSPITAL_COMMUNITY): Payer: Medicare Other

## 2021-12-18 DIAGNOSIS — G9341 Metabolic encephalopathy: Secondary | ICD-10-CM

## 2021-12-18 DIAGNOSIS — N3001 Acute cystitis with hematuria: Secondary | ICD-10-CM | POA: Diagnosis not present

## 2021-12-18 DIAGNOSIS — S31809A Unspecified open wound of unspecified buttock, initial encounter: Secondary | ICD-10-CM

## 2021-12-18 DIAGNOSIS — D473 Essential (hemorrhagic) thrombocythemia: Secondary | ICD-10-CM

## 2021-12-18 DIAGNOSIS — R531 Weakness: Secondary | ICD-10-CM

## 2021-12-18 DIAGNOSIS — J9601 Acute respiratory failure with hypoxia: Secondary | ICD-10-CM

## 2021-12-18 DIAGNOSIS — N3000 Acute cystitis without hematuria: Secondary | ICD-10-CM | POA: Diagnosis not present

## 2021-12-18 HISTORY — PX: IR THORACENTESIS RIGHT ASP PLEURAL SPACE W/IMG GUIDE: IMG5380

## 2021-12-18 LAB — URINE CULTURE: Culture: >=100000 — AB

## 2021-12-18 LAB — BASIC METABOLIC PANEL
Anion gap: 13 (ref 5–15)
Anion gap: 10 (ref 5–15)
BUN: 15 mg/dL (ref 8–23)
BUN: 15 mg/dL (ref 8–23)
CO2: 24 mmol/L (ref 22–32)
CO2: 25 mmol/L (ref 22–32)
Calcium: 8.3 mg/dL — ABNORMAL LOW (ref 8.9–10.3)
Calcium: 8.1 mg/dL — ABNORMAL LOW (ref 8.9–10.3)
Chloride: 97 mmol/L — ABNORMAL LOW (ref 98–111)
Chloride: 101 mmol/L (ref 98–111)
Creatinine, Ser: 0.9 mg/dL (ref 0.61–1.24)
Creatinine, Ser: 0.87 mg/dL (ref 0.61–1.24)
GFR, Estimated: >60 mL/min (ref >60)
GFR, Estimated: >60 mL/min (ref >60)
Glucose, Bld: 111 mg/dL — ABNORMAL HIGH (ref 70–99)
Glucose, Bld: 110 mg/dL — ABNORMAL HIGH (ref 70–99)
Potassium: 3.4 mmol/L — ABNORMAL LOW (ref 3.5–5.1)
Potassium: 3.4 mmol/L — ABNORMAL LOW (ref 3.5–5.1)
Sodium: 134 mmol/L — ABNORMAL LOW (ref 135–145)
Sodium: 136 mmol/L (ref 135–145)

## 2021-12-18 LAB — GRAM STAIN: Gram Stain: NONE SEEN

## 2021-12-18 LAB — CBC WITH DIFFERENTIAL/PLATELET
Abs Immature Granulocytes: 1.33 10*3/uL — ABNORMAL HIGH (ref 0.00–0.07)
Basophils Absolute: 0.5 10*3/uL — ABNORMAL HIGH (ref 0.0–0.1)
Basophils Relative: 1 %
Eosinophils Absolute: 0.1 10*3/uL (ref 0.0–0.5)
Eosinophils Relative: 0 %
HCT: 32.2 % — ABNORMAL LOW (ref 39.0–52.0)
Hemoglobin: 10.8 g/dL — ABNORMAL LOW (ref 13.0–17.0)
Immature Granulocytes: 4 %
Lymphocytes Relative: 2 %
Lymphs Abs: 0.8 10*3/uL (ref 0.7–4.0)
MCH: 40 pg — ABNORMAL HIGH (ref 26.0–34.0)
MCHC: 33.5 g/dL (ref 30.0–36.0)
MCV: 119.3 fL — ABNORMAL HIGH (ref 80.0–100.0)
Monocytes Absolute: 1.6 10*3/uL — ABNORMAL HIGH (ref 0.1–1.0)
Monocytes Relative: 4 %
Neutro Abs: 32.9 10*3/uL — ABNORMAL HIGH (ref 1.7–7.7)
Neutrophils Relative %: 89 %
Platelets: 334 10*3/uL (ref 150–400)
RBC: 2.7 MIL/uL — ABNORMAL LOW (ref 4.22–5.81)
RDW: 26.7 % — ABNORMAL HIGH (ref 11.5–15.5)
WBC: 37.3 10*3/uL — ABNORMAL HIGH (ref 4.0–10.5)
nRBC: 0.3 % — ABNORMAL HIGH (ref 0.0–0.2)

## 2021-12-18 LAB — MAGNESIUM: Magnesium: 1.6 mg/dL — ABNORMAL LOW (ref 1.7–2.4)

## 2021-12-18 LAB — ALBUMIN, PLEURAL OR PERITONEAL FLUID: Albumin, Fluid: <1.5 g/dL

## 2021-12-18 LAB — FOLATE: Folate: 9.2 ng/mL (ref >5.9)

## 2021-12-18 LAB — VITAMIN B12: Vitamin B-12: 1183 pg/mL — ABNORMAL HIGH (ref 180–914)

## 2021-12-18 MED ORDER — METOPROLOL TARTRATE 25 MG PO TABS
25.0000 mg | ORAL_TABLET | Freq: Two times a day (BID) | ORAL | Status: DC
  Administered 2021-12-18 – 2021-12-24 (×12): 25 mg via ORAL
  Filled 2021-12-18 (×13): qty 1

## 2021-12-18 MED ORDER — ALBUTEROL SULFATE (2.5 MG/3ML) 0.083% IN NEBU: 2.5000 mg | INHALATION_SOLUTION | RESPIRATORY_TRACT | Status: DC | PRN

## 2021-12-18 MED ORDER — SODIUM CHLORIDE 0.9 % IV SOLN: 500.0000 mg | INTRAVENOUS | Status: DC

## 2021-12-18 MED ORDER — MAGNESIUM SULFATE 2 GM/50ML IV SOLN
2.0000 g | Freq: Once | INTRAVENOUS | Status: AC
  Administered 2021-12-18: 2 g via INTRAVENOUS
  Filled 2021-12-18: qty 50

## 2021-12-18 MED ORDER — IOHEXOL 350 MG/ML SOLN
70.0000 mL | Freq: Once | INTRAVENOUS | Status: AC | PRN
  Administered 2021-12-18: 70 mL via INTRAVENOUS

## 2021-12-18 MED ORDER — ALBUTEROL SULFATE HFA 108 (90 BASE) MCG/ACT IN AERS: 2.0000 | INHALATION_SPRAY | RESPIRATORY_TRACT | Status: DC | PRN

## 2021-12-18 MED ORDER — LIDOCAINE HCL 1 % IJ SOLN
INTRAMUSCULAR | Status: AC
  Administered 2021-12-18: 8 mL
  Filled 2021-12-18: qty 20

## 2021-12-18 MED ORDER — IOHEXOL 9 MG/ML PO SOLN
500.0000 mL | ORAL | Status: AC
  Administered 2021-12-18 (×2): 500 mL via ORAL

## 2021-12-18 MED ORDER — POTASSIUM CHLORIDE CRYS ER 20 MEQ PO TBCR
20.0000 meq | EXTENDED_RELEASE_TABLET | Freq: Two times a day (BID) | ORAL | Status: DC
  Administered 2021-12-18 – 2021-12-20 (×6): 20 meq via ORAL
  Filled 2021-12-18 (×6): qty 1

## 2021-12-18 MED ORDER — SODIUM CHLORIDE 0.9 % IV SOLN
2.0000 g | INTRAVENOUS | Status: DC
  Administered 2021-12-18 – 2021-12-19 (×2): 2 g via INTRAVENOUS
  Filled 2021-12-18 (×2): qty 20

## 2021-12-18 MED ORDER — TAMSULOSIN HCL 0.4 MG PO CAPS
0.4000 mg | ORAL_CAPSULE | Freq: Every day | ORAL | Status: DC
  Administered 2021-12-18 – 2021-12-20 (×3): 0.4 mg via ORAL
  Filled 2021-12-18 (×3): qty 1

## 2021-12-18 MED ORDER — MELATONIN 3 MG PO TABS
3.0000 mg | ORAL_TABLET | Freq: Every day | ORAL | Status: DC
  Administered 2021-12-18 – 2021-12-23 (×6): 3 mg via ORAL
  Filled 2021-12-18 (×6): qty 1

## 2021-12-18 MED ORDER — FERROUS SULFATE 325 (65 FE) MG PO TABS
325.0000 mg | ORAL_TABLET | Freq: Every day | ORAL | Status: DC
  Administered 2021-12-19 – 2021-12-24 (×6): 325 mg via ORAL
  Filled 2021-12-18 (×6): qty 1

## 2021-12-18 MED ORDER — AZITHROMYCIN 250 MG PO TABS
500.0000 mg | ORAL_TABLET | Freq: Every day | ORAL | Status: AC
  Administered 2021-12-19 – 2021-12-22 (×4): 500 mg via ORAL
  Filled 2021-12-18 (×4): qty 2

## 2021-12-18 MED ORDER — SODIUM CHLORIDE 0.9 % IV SOLN
500.0000 mg | INTRAVENOUS | Status: AC
  Administered 2021-12-18: 500 mg via INTRAVENOUS
  Filled 2021-12-18: qty 5

## 2021-12-18 NOTE — Procedures (Signed)
PROCEDURE SUMMARY:  Successful US guided left thoracentesis. Yielded 1.7L of pleural fluid. Pt tolerated procedure well. No immediate complications.  Specimen was sent for labs. CXR ordered.  EBL < 5 mL  Justin Asencio PA-C 12/18/2021 11:44 AM

## 2021-12-18 NOTE — Progress Notes (Addendum)
[facility] PROGRESS NOTE    Progress Note  Justin [name]  HYW:[id number] DOB: [birth year] DOA: 12/15/2021 PCP: [name], MD     Brief Narrative:   Justin [name] is an 66 y.o. male past medical history of essential thrombocythemia, chronic atrial fibrillation on Eliquis, dural venous thrombosis, aortic stenosis with a history of aortic valve replacement, CAD status post CABG, essential hypertension, diabetes mellitus type 2 chronic systolic heart failure BPH with a left total hip replacement on November 04, 2021 comes in to the ED for generalized weakness confusion for 1 week, he was also noted to have a pressure ulcer on the left buttock and heel, white count of 29,000 CT of the head showed no acute findings.    Assessment/Plan:   Acute respiratory failure with hypoxia: Currently requiring 2 L of oxygen to keep saturations greater than 90%. CT of the chest showed mederate bilateral pleural effusions pulmonary atelectasis with possible coexisting pneumonia and lymphadenopathy Consult IR for thoracocentesis and for labs.  Hold Eliquis. De-escalate antibiotic coverage to Rocephin and azithromycin CBC shows a white count of 37,000 has remained afebrile. Urine culture grew more than 100,000 colonies gram-negative rods.  Leukocytosis: Of unclear source.  Denies any night sweats CT chest showed possible pneumonia, bilateral hilar adenopathy, will get a CT scan of the abdomen and pelvis rule out leukemia lymphoma. Continue IV Rocephin. Urine culture grew gram-negative rods. Continues to worsen  Acute metabolic encephalopathy: Of unclear etiology, possibly due to hypoxia now resolved. Able to carry on a conversation without any difficulties and remember everything that has passed over the last 48 hours. Incentive spirometry out of bed to chair consult physical therapy.  Possible acute on chronic systolic heart failure: Neck veins are down, lower extremity edema, BNP  is 1700, chest x-ray showed mild pulmonary edema. Start him on IV Lasix monitor strict I's and O's and daily weights, monitor and replete electrolytes. He is negative about 2 L continue IV Lasix, basic metabolic panels pending replete electrolytes as needed. Strict I's and O's Limited fluid intake to 1200 cc.  Chronic fibrillation: Continue metoprolol. Hold Eliquis for possible thoracocentesis.  Cranial sinus thrombosis: CT of the head unchanged. Hold Eliquis Eliquis.  Essential thrombocythemia: Continue Hydrea  Microcytic anemia: Check folate and B12.  Stage II pressure decubitus ulcer and left heel: Continue wound care. RN Pressure Injury Documentation: Pressure Injury 11/17/21 Heel Left Unstageable - Full thickness tissue loss in which the base of the injury is covered by slough (yellow, tan, gray, green or brown) and/or eschar (tan, brown or black) in the wound bed. pressure injury (Active)  11/17/21 0612  Location: Heel  Location Orientation: Left  Staging: Unstageable - Full thickness tissue loss in which the base of the injury is covered by slough (yellow, tan, gray, green or brown) and/or eschar (tan, brown or black) in the wound bed.  Wound Description (Comments): pressure injury  Present on Admission: Yes  Dressing Type Foam - Lift dressing to assess site every shift 12/17/21 2100     Pressure Injury 11/19/21 Coccyx Mid;Lower Stage 2 -  Partial thickness loss of dermis presenting as a shallow open injury with a red, pink wound bed without slough. (Active)  11/19/21 1625  Location: Coccyx  Location Orientation: Mid;Lower  Staging: Stage 2 -  Partial thickness loss of dermis presenting as a shallow open injury with a red, pink wound bed without slough.  Wound Description (Comments):   Present on Admission: Yes  Dressing Type Foam -  Lift dressing to assess site every shift 12/17/21 2100     Pressure Injury 11/19/21 Sacrum Left Stage 2 -  Partial thickness loss of dermis  presenting as a shallow open injury with a red, pink wound bed without slough. (Active)  11/19/21 1625  Location: Sacrum  Location Orientation: Left  Staging: Stage 2 -  Partial thickness loss of dermis presenting as a shallow open injury with a red, pink wound bed without slough.  Wound Description (Comments):   Present on Admission: Yes  Dressing Type Foam - Lift dressing to assess site every shift 12/17/21 2100     Pressure Injury 11/19/21 Sacrum Right Stage 2 -  Partial thickness loss of dermis presenting as a shallow open injury with a red, pink wound bed without slough. (Active)  11/19/21 1625  Location: Sacrum  Location Orientation: Right  Staging: Stage 2 -  Partial thickness loss of dermis presenting as a shallow open injury with a red, pink wound bed without slough.  Wound Description (Comments):   Present on Admission: Yes  Dressing Type None 11/29/21 2045    DVT prophylaxis: Eliquis Family Communication: None Status is: Inpatient Remains inpatient appropriate because: Acute respiratory failure with hypoxia of unclear etiology    Code Status:     Code Status Orders  (From admission, onward)           Start     Ordered   12/16/21 0046  Do not attempt resuscitation (DNR)  Continuous       Question Answer Comment  In the event of cardiac or respiratory ARREST Do not call a "code blue"   In the event of cardiac or respiratory ARREST Do not perform Intubation, CPR, defibrillation or ACLS   In the event of cardiac or respiratory ARREST Use medication by any route, position, wound care, and other measures to relive pain and suffering. May use oxygen, suction and manual treatment of airway obstruction as needed for comfort.      12/16/21 0049           Code Status History     Date Active Date Inactive Code Status Order ID Comments User Context   11/19/2021 1532 11/30/2021 1527 Full Code [id number]  Elizabeth Sauer Inpatient   11/19/2021 1532 11/19/2021  1532 DNR [id number]  Cathlyn Parsons, PA-C Inpatient   11/11/2021 1629 11/19/2021 1509 DNR [id number]  Vernelle Emerald, MD Inpatient   11/04/2021 1707 11/11/2021 1629 Full Code [id number]  Irving Copas, PA-C Inpatient   08/18/2021 2015 08/22/2021 1938 Full Code [id number]  Kayleen Memos, DO ED   10/04/2019 2258 10/12/2019 2204 Full Code [id number]  Stark Klein, MD ED   12/23/2018 1823 12/24/2018 2142 Full Code [id number]  Norman Herrlich Inpatient   08/10/2018 0059 08/13/2018 2201 Full Code [id number]  Etta Quill, DO ED   01/03/2018 1913 01/10/2018 1713 Full Code [id number]  Nani Skillern, PA-C Inpatient   12/24/2017 1101 12/24/2017 1552 Full Code [id number]  Bensimhon, Shaune Pascal, MD Inpatient         IV Access:   Peripheral IV   Procedures and diagnostic studies:   CT CHEST WO CONTRAST  Result Date: 12/17/2021 CLINICAL DATA:  Shortness of breath, chronic, unclear etiology. EXAM: CT CHEST WITHOUT CONTRAST TECHNIQUE: Multidetector CT imaging of the chest was performed following the standard protocol without IV contrast. RADIATION DOSE REDUCTION: This exam was performed according to the departmental dose-optimization program which includes automated exposure control, adjustment of the mA  and/or kV according to patient size and/or use of iterative reconstruction technique. COMPARISON:  Chest radiography yesterday. Chest radiography 11/12/2021. CT chest 10/04/2019. FINDINGS: Cardiovascular: Previous median sternotomy and CABG. Chronic cardiomegaly. Mediastinum/Nodes: Mild prominence of the hilar and mediastinal lymph nodes, probably reactive to chronic congestive heart failure. Lungs/Pleura: Moderate bilateral pleural effusions layering dependently with dependent pulmonary atelectasis, particularly pronounced in the lower lobes. Coexistence pneumonia within the lower lobes is not excluded. There is background pulmonary emphysema and scarring, upper lobe predominant. Three or 4 6 mm  nodular shadows in the right upper lobe not seen in 2021 could relate to inflammation or atelectasis. However, follow-up suggested when/if congestive heart failure is resolved. Upper Abdomen: No significant upper abdominal finding. Musculoskeletal: Chronic thoracic degenerative changes. IMPRESSION: 1. Moderate bilateral pleural effusions layering dependently with dependent pulmonary atelectasis, particularly pronounced in the lower lobes. Coexistence pneumonia within the lower lobes is not excluded. Findings consistent with chronic congestive heart failure. 2. Background pulmonary emphysema and scarring, upper lobe predominant. 3. Mild prominence of the hilar and mediastinal lymph nodes, probably reactive to chronic congestive heart failure. 4. Three or four 6 mm nodular shadows in the right upper lobe not seen in 2021 could relate to inflammation or atelectasis. However, follow-up suggested when/if congestive heart failure is resolved. Non-contrast chest CT at 3-6 months would be recommended. If the nodules are stable at time of repeat CT, then future CT at 18-24 months (from today's scan) is considered optional for low-risk patients, but is recommended for high-risk patients. This recommendation follows the consensus statement: Guidelines for Management of Incidental Pulmonary Nodules Detected on CT Images: From the Fleischner Society 2017; Radiology 2017; 284:228-243. Emphysema (ICD10-J43.9). Aortic Atherosclerosis (ICD10-I70.0). Electronically Signed   By: Nelson Chimes M.D.   On: 12/17/2021 09:26     Medical Consultants:   None.   Subjective:    Justin [name] breathing is unchanged, you did not have good sleep last night: Something for sleep.  Objective:    Vitals:   12/17/21 0923 12/17/21 1635 12/17/21 2046 12/18/21 0423  BP: (!) 104/50 119/77 120/77 109/75  Pulse: 93 93 (!) 54 94  Resp: '16 16 18   '$ Temp: (!) 97.2 F (36.2 C) 97.9 F (36.6 C) 97.8 F (36.6 C) 97.9 F (36.6 C)   TempSrc: Oral Oral Oral Oral  SpO2: (!) 88% 94% 97% 97%  Weight:      Height:       SpO2: 97 % O2 Flow Rate (L/min): 4 L/min   Intake/Output Summary (Last 24 hours) at 12/18/2021 0826 Last data filed at 12/18/2021 0505 Gross per 24 hour  Intake --  Output 1375 ml  Net -1375 ml    Filed Weights   12/16/21 1753  Weight: 73.6 kg    Exam: General exam: In no acute distress, cachectic Respiratory system: Good air movement and clear to auscultation. Cardiovascular system: S1 & S2 heard, RRR. No JVD. Gastrointestinal system: Abdomen is nondistended, soft and nontender.  Skin: No rashes, lesions or ulcers Psychiatry: Judgement and insight appear normal. Mood & affect appropriate.   Data Reviewed:    Labs: Basic Metabolic Panel: Recent Labs  Lab 12/15/21 1419 12/16/21 0330 12/18/21 0522  NA 132*  --  134*  K 3.7  --  3.4*  CL 100  --  97*  CO2 23  --  24  GLUCOSE 79  --  111*  BUN 15  --  15  CREATININE 1.01  --  0.90  CALCIUM 8.0*  --  8.3*  MG  --  1.7  --     GFR Estimated Creatinine Clearance: 74.5 mL/min (by C-G formula based on SCr of 0.9 mg/dL). Liver Function Tests: Recent Labs  Lab 12/15/21 1419  AST 26  ALT 15  ALKPHOS 145*  BILITOT 1.5*  PROT 6.3*  ALBUMIN 2.5*    No results for input(s): "LIPASE", "AMYLASE" in the last 168 hours. Recent Labs  Lab 12/15/21 1745  AMMONIA 31    Coagulation profile No results for input(s): "INR", "PROTIME" in the last 168 hours. COVID-19 Labs  No results for input(s): "DDIMER", "FERRITIN", "LDH", "CRP" in the last 72 hours.  Lab Results  Component Value Date   SARSCOV2NAA NEGATIVE 10/12/2019   [place] NEGATIVE 10/04/2019   SARSCOV2NAA NOT DETECTED 12/20/2018   [place] NEGATIVE 08/10/2018    CBC: Recent Labs  Lab 12/15/21 1419 12/16/21 0330 12/17/21 1052  WBC 29.0* 29.2* 31.0*  NEUTROABS 25.7*  --  26.7*  HGB 9.9* 9.7* 10.4*  HCT 30.7* 29.3* 30.5*  MCV 120.4* 119.6* 116.9*  PLT  306 283 293    Cardiac Enzymes: Recent Labs  Lab 12/15/21 1419  CKTOTAL 64    BNP (last 3 results) No results for input(s): "PROBNP" in the last 8760 hours. CBG: No results for input(s): "GLUCAP" in the last 168 hours. D-Dimer: No results for input(s): "DDIMER" in the last 72 hours. Hgb A1c: No results for input(s): "HGBA1C" in the last 72 hours. Lipid Profile: No results for input(s): "CHOL", "HDL", "LDLCALC", "TRIG", "CHOLHDL", "LDLDIRECT" in the last 72 hours. Thyroid function studies: No results for input(s): "TSH", "T4TOTAL", "T3FREE", "THYROIDAB" in the last 72 hours.  Invalid input(s): "FREET3" Anemia work up: No results for input(s): "VITAMINB12", "FOLATE", "FERRITIN", "TIBC", "IRON", "RETICCTPCT" in the last 72 hours. Sepsis Labs: Recent Labs  Lab 12/15/21 1419 12/15/21 1730 12/15/21 1950 12/16/21 0330 12/17/21 1052  WBC 29.0*  --   --  29.2* 31.0*  LATICACIDVEN  --  1.3 1.1  --   --     Microbiology Recent Results (from the past 240 hour(s))  Blood culture (routine x 2)     Status: None (Preliminary result)   Collection Time: 12/15/21  5:25 PM   Specimen: BLOOD  Result Value Ref Range Status   Specimen Description BLOOD LEFT ANTECUBITAL  Final   Special Requests   Final    BOTTLES DRAWN AEROBIC AND ANAEROBIC Blood Culture results may not be optimal due to an excessive volume of blood received in culture bottles   Culture   Final    NO GROWTH 3 DAYS Performed at [facility], [place] [address] [place], [place] [address]    Report Status PENDING  Incomplete  Blood culture (routine x 2)     Status: None (Preliminary result)   Collection Time: 12/15/21  5:30 PM   Specimen: BLOOD  Result Value Ref Range Status   Specimen Description BLOOD RIGHT ANTECUBITAL  Final   Special Requests   Final    BOTTLES DRAWN AEROBIC AND ANAEROBIC Blood Culture adequate volume   Culture   Final    NO GROWTH 3 DAYS Performed at [facility], [place] [address], [place], [place] [address]    Report Status PENDING  Incomplete  Urine Culture     Status: Abnormal (Preliminary result)   Collection Time: 12/15/21  7:50 PM   Specimen: Urine, Clean Catch  Result Value Ref Range Status   Specimen Description URINE, CLEAN CATCH  Final  Special Requests NONE  Final   Culture (A)  Final    >=100,000 COLONIES/mL GRAM NEGATIVE RODS IDENTIFICATION AND SUSCEPTIBILITIES TO FOLLOW Performed at [facility], [address], [place], [place] [address]    Report Status PENDING  Incomplete     Medications:    (feeding supplement) PROSource Plus  30 mL Oral BID BM   apixaban  5 mg Oral BID   Chlorhexidine Gluconate Cloth  6 each Topical Daily   cholecalciferol  5,000 Units Oral Daily   ferrous sulfate  325 mg Oral BID WC   furosemide  40 mg Intravenous Daily   hydroxyurea  500 mg Oral BID   leptospermum manuka honey  1 Application Topical Daily   multivitamin with minerals  1 tablet Oral Daily   pantoprazole  40 mg Oral BID   rosuvastatin  20 mg Oral Daily   sucralfate  1 g Oral BID   Continuous Infusions:  ceFEPime (MAXIPIME) IV 2 g (12/18/21 0357)   vancomycin 750 mg (12/18/21 0123)      LOS: 2 days   Charlynne Cousins  [facility]  12/18/2021, 8:26 AM

## 2021-12-19 DIAGNOSIS — N3001 Acute cystitis with hematuria: Secondary | ICD-10-CM | POA: Diagnosis not present

## 2021-12-19 DIAGNOSIS — I2510 Atherosclerotic heart disease of native coronary artery without angina pectoris: Secondary | ICD-10-CM

## 2021-12-19 DIAGNOSIS — D693 Immune thrombocytopenic purpura: Secondary | ICD-10-CM

## 2021-12-19 DIAGNOSIS — E1169 Type 2 diabetes mellitus with other specified complication: Secondary | ICD-10-CM

## 2021-12-19 DIAGNOSIS — G08 Intracranial and intraspinal phlebitis and thrombophlebitis: Secondary | ICD-10-CM

## 2021-12-19 DIAGNOSIS — N3000 Acute cystitis without hematuria: Secondary | ICD-10-CM | POA: Diagnosis not present

## 2021-12-19 DIAGNOSIS — L89622 Pressure ulcer of left heel, stage 2: Secondary | ICD-10-CM

## 2021-12-19 DIAGNOSIS — E440 Moderate protein-calorie malnutrition: Secondary | ICD-10-CM

## 2021-12-19 DIAGNOSIS — N401 Enlarged prostate with lower urinary tract symptoms: Secondary | ICD-10-CM

## 2021-12-19 DIAGNOSIS — Z9181 History of falling: Secondary | ICD-10-CM

## 2021-12-19 DIAGNOSIS — R338 Other retention of urine: Secondary | ICD-10-CM

## 2021-12-19 DIAGNOSIS — I110 Hypertensive heart disease with heart failure: Secondary | ICD-10-CM

## 2021-12-19 DIAGNOSIS — G9341 Metabolic encephalopathy: Secondary | ICD-10-CM

## 2021-12-19 DIAGNOSIS — K219 Gastro-esophageal reflux disease without esophagitis: Secondary | ICD-10-CM

## 2021-12-19 DIAGNOSIS — I4891 Unspecified atrial fibrillation: Secondary | ICD-10-CM

## 2021-12-19 DIAGNOSIS — I5023 Acute on chronic systolic (congestive) heart failure: Secondary | ICD-10-CM

## 2021-12-19 DIAGNOSIS — S31809A Unspecified open wound of unspecified buttock, initial encounter: Secondary | ICD-10-CM | POA: Diagnosis not present

## 2021-12-19 DIAGNOSIS — E782 Mixed hyperlipidemia: Secondary | ICD-10-CM

## 2021-12-19 LAB — BASIC METABOLIC PANEL
Anion gap: 5 (ref 5–15)
BUN: 17 mg/dL (ref 8–23)
CO2: 25 mmol/L (ref 22–32)
Calcium: 8 mg/dL — ABNORMAL LOW (ref 8.9–10.3)
Chloride: 102 mmol/L (ref 98–111)
Creatinine, Ser: 0.86 mg/dL (ref 0.61–1.24)
GFR, Estimated: >60 mL/min (ref >60)
Glucose, Bld: 99 mg/dL (ref 70–99)
Potassium: 4.3 mmol/L (ref 3.5–5.1)
Sodium: 132 mmol/L — ABNORMAL LOW (ref 135–145)

## 2021-12-19 LAB — MRSA CULTURE

## 2021-12-19 MED ORDER — APIXABAN 5 MG PO TABS
5.0000 mg | ORAL_TABLET | Freq: Two times a day (BID) | ORAL | Status: DC
  Administered 2021-12-19 (×2): 5 mg via ORAL
  Filled 2021-12-19 (×2): qty 1

## 2021-12-19 MED ORDER — SODIUM CHLORIDE 0.9 % IV SOLN: INTRAVENOUS | Status: DC | PRN

## 2021-12-19 MED ORDER — FUROSEMIDE 10 MG/ML IJ SOLN
40.0000 mg | Freq: Two times a day (BID) | INTRAMUSCULAR | Status: DC
  Administered 2021-12-19 – 2021-12-20 (×4): 40 mg via INTRAVENOUS
  Filled 2021-12-19 (×4): qty 4

## 2021-12-19 MED ORDER — FUROSEMIDE 20 MG PO TABS: 20.0000 mg | ORAL_TABLET | Freq: Every day | ORAL | Status: DC

## 2021-12-19 NOTE — Progress Notes (Signed)
Physical Therapy Treatment Patient Details Name: Justin [name] MRN: [id number] DOB: [birth year] Today's Date: 12/19/2021   History of Present Illness Pt is a 66 y/o male who admitted for acute hypoxemic respiratory failure and acute metabolic encephalopathy in setting of UTI. PMH: a fib, DM2, HTN, CHF, recent L hip sx.    PT Comments    Pt received in supine, agreeable to therapy session, with good participation and tolerance for supine LE exercises and repositioning. Pt needing up to maxA for supine scooting toward HOB and modA for supine to long sitting in bed. Pt with good effort for all LE exercises and HEP handout given to reinforce. Pt pulling ~300-500 on IS, encouraged him to continue hourly to assist with weaning off of O2. Bed positioned into chair posture to promote pulmonary clearance and pillows for pressure relief. Pt continues to benefit from PT services to progress toward functional mobility goals. Limited session due to schedule/time, plan to progress OOB transfers next session and initiate gait training if VSS.  Recommendations for follow up therapy are one component of a multi-disciplinary discharge planning process, led by the attending physician.  Recommendations may be updated based on patient status, additional functional criteria and insurance authorization.  Follow Up Recommendations  Skilled nursing-short term rehab (<3 hours/day)     Assistance Recommended at Discharge Frequent or constant Supervision/Assistance  Patient can return home with the following A lot of help with walking and/or transfers;A lot of help with bathing/dressing/bathroom   Equipment Recommendations  None recommended by PT    Recommendations for Other Services       Precautions / Restrictions Precautions Precautions: Fall;Other (comment) Precaution Comments: Wounds on L heel and sacral area, monitor orthostatics, O2 Restrictions Weight Bearing Restrictions: Yes LLE Weight Bearing:  Weight bearing as tolerated     Mobility  Bed Mobility Overal bed mobility: Needs Assistance Bed Mobility: Rolling Rolling: Min assist   Supine to sit: Mod assist, HOB elevated (to long sitting)     General bed mobility comments: maxA for posterior supine scooting toward HOB, pt using BUE and BLE to assist and staff assist with bed pad; pt able to pull to long sitting with modA but defer OOB due to lack of +2 assist and limited time during session.       Balance Overall balance assessment: Needs assistance Sitting-balance support: No upper extremity supported, Feet supported Sitting balance-Leahy Scale: Poor Sitting balance - Comments: needs UE support for long sitting in bed                                    Cognition Arousal/Alertness: Awake/alert Behavior During Therapy: WFL for tasks assessed/performed Overall Cognitive Status: Impaired/Different from baseline                               Problem Solving: Requires verbal cues General Comments: Pt cooperative, following simple commands well.        Exercises Other Exercises Other Exercises: supine BLE AROM: ankle pumps, heel slides, SLR (AA), SAQ, hip abd, quad/glute sets x5-10 reps ea Other Exercises: IS x 10 reps (300-567m) Other Exercises: from elevated HOB to long sitting x2 reps, cues for "bed crunches" throughout the day 3x daily if pt able for UE/core strengthening    General Comments General comments (skin integrity, edema, etc.): SpO2 WFL on 1L for bed-level exercises and  repositioning. BP 103/60 supine taken during session      Pertinent Vitals/Pain Pain Assessment Pain Assessment: Faces Faces Pain Scale: Hurts a little bit Pain Location: generalized with movement, L LE (heel), buttocks Pain Intervention(s): Monitored during session, Repositioned     PT Goals (current goals can now be found in the care plan section) Acute Rehab PT Goals PT Goal Formulation: With  patient Time For Goal Achievement: 12/30/21 Progress towards PT goals: Progressing toward goals    Frequency    Min 2X/week      PT Plan Current plan remains appropriate       AM-PAC PT "6 Clicks" Mobility   Outcome Measure  Help needed turning from your back to your side while in a flat bed without using bedrails?: A Little Help needed moving from lying on your back to sitting on the side of a flat bed without using bedrails?: A Lot Help needed moving to and from a bed to a chair (including a wheelchair)?: A Lot Help needed standing up from a chair using your arms (e.g., wheelchair or bedside chair)?: A Lot Help needed to walk in hospital room?: Total Help needed climbing 3-5 steps with a railing? : Total 6 Click Score: 11    End of Session Equipment Utilized During Treatment: Oxygen Activity Tolerance: Patient tolerated treatment well;Other (comment) (time limited due to schedule) Patient left: in bed;with call bell/phone within reach;with bed alarm set;Other (comment) (bed in chair posture, heels floated) Nurse Communication: Mobility status PT Visit Diagnosis: Other abnormalities of gait and mobility (R26.89);Muscle weakness (generalized) (M62.81)     Time: 3149-7026 PT Time Calculation (min) (ACUTE ONLY): 11 min  Charges:  $Therapeutic Exercise: 8-22 mins                     Bryant Lipps P., PTA Acute Rehabilitation Services Secure Chat Preferred 9a-5:30pm Office: [place] 12/19/2021, 6:30 PM

## 2021-12-19 NOTE — Plan of Care (Signed)
  Problem: Education: Goal: Knowledge of General Education information will improve Description: Including pain rating scale, medication(s)/side effects and non-pharmacologic comfort measures Outcome: Progressing   Problem: Coping: Goal: Level of anxiety will decrease Outcome: Progressing   Problem: Safety: Goal: Ability to remain free from injury will improve Outcome: Progressing   

## 2021-12-19 NOTE — Care Management Important Message (Signed)
Important Message  Patient Details  Name: Justin [name] MRN: [id number] Date of Birth: [birth year]   Medicare Important Message Given:  Yes     Hannah Beat 12/19/2021, 2:46 PM

## 2021-12-19 NOTE — Progress Notes (Signed)
SW following for SNF placement. Voicemail left for pt's wife requesting return call re current SNF offers and choice. Will need to start auth closer to dc.   Wandra Feinstein, MSW, LCSW [phone] (coverage)

## 2021-12-19 NOTE — Progress Notes (Addendum)
[facility] PROGRESS NOTE    Progress Note  Justin [name]  XKP:[id number] DOB: Oct 15, 1955 DOA: 12/15/2021 PCP: [name], MD     Brief Narrative:   Justin [name] is an 66 y.o. male past medical history of essential thrombocythemia, chronic atrial fibrillation on Eliquis, systolic heart failure with an EF of 45% dural venous thrombosis, aortic stenosis with a history of aortic valve replacement, CAD status post CABG, essential hypertension, diabetes mellitus type 2 chronic systolic heart failure BPH with a left total hip replacement on November 04, 2021 comes in to the ED for generalized weakness confusion for 1 week, he was also noted to have a pressure ulcer on the left buttock and heel, white count of 29,000 CT of the head showed no acute findings.    Assessment/Plan:   Acute respiratory failure with hypoxia: Currently on 2 L of oxygen. CT scan showed moderate bilateral pleural effusion with coexisting lymphadenopathy. IR was consulted thoracocentesis performed remove 1.7 L, repeated chest x-ray postprocedure showed no pneumothorax. Currently on IV Rocephin and azithromycin, has remained afebrile. Urine culture grew more than 100,000 colonies of gram-negative rods. White blood cell count was greater than 37,000, he relates he does have a history of leukemia he is being followed at [facility]. CT scan of the abdomen pelvis showed no abdominal lymphadenopathy mild splenomegaly ascites. Possibly due to heart failure restrict fluids started on aggressive IV Lasix monitor I's and O's.  Leukocytosis: Of unclear source.  Denies any night sweats CT chest showed possible pneumonia 6 mm nodules 3-4. CT scan of the abdomen pelvis showed no lymphadenopathy showed mild ascites and splenomegaly. Urine culture grew gram-negative rods. He relates he has a history of leukemia which is being followed up at [facility].  There is no record of this in the chart or care everywhere  he continues to improve clinically.  UTI due to indwelling foley cath:  UC grew proteus mirabalis. Cont rocephin. On flomax, will attempt voiding trail.  Acute metabolic encephalopathy: Of unclear etiology, possibly due to hypoxia now resolved. Able to carry on a conversation without any difficulties and remember everything that has passed over the last 48 hours. Incentive spirometry out of bed to chair consult physical therapy.  acute on chronic systolic heart failure: Neck veins are up lower extremity edema, BNP is 1700, chest x-ray showed mild pulmonary edema. Continue IV stick strict I's and O's Daily weights monitor electrolytes. Strict I's and O's Limited fluid intake to 1200 cc.  Chronic atrial fibrillation: Continue metoprolol. Resume Eliquis.  Cranial sinus thrombosis: CT of the head unchanged. Hold Eliquis Eliquis.  Essential thrombocythemia: Continue Hydrea  Microcytic anemia: Check folate and B12.  Severe protein malnutrition: Ensure TID.  Stage II pressure decubitus ulcer and left heel: Continue wound care. RN Pressure Injury Documentation: Pressure Injury 11/17/21 Heel Left Unstageable - Full thickness tissue loss in which the base of the injury is covered by slough (yellow, tan, gray, green or brown) and/or eschar (tan, brown or black) in the wound bed. pressure injury (Active)  11/17/21 0612  Location: Heel  Location Orientation: Left  Staging: Unstageable - Full thickness tissue loss in which the base of the injury is covered by slough (yellow, tan, gray, green or brown) and/or eschar (tan, brown or black) in the wound bed.  Wound Description (Comments): pressure injury  Present on Admission: Yes  Dressing Type Foam - Lift dressing to assess site every shift 12/19/21 0500     Pressure Injury 11/19/21  Coccyx Mid;Lower Stage 2 -  Partial thickness loss of dermis presenting as a shallow open injury with a red, pink wound bed without slough. (Active)   11/19/21 1625  Location: Coccyx  Location Orientation: Mid;Lower  Staging: Stage 2 -  Partial thickness loss of dermis presenting as a shallow open injury with a red, pink wound bed without slough.  Wound Description (Comments):   Present on Admission: Yes  Dressing Type Foam - Lift dressing to assess site every shift 12/18/21 2040     Pressure Injury 11/19/21 Sacrum Left Stage 2 -  Partial thickness loss of dermis presenting as a shallow open injury with a red, pink wound bed without slough. (Active)  11/19/21 1625  Location: Sacrum  Location Orientation: Left  Staging: Stage 2 -  Partial thickness loss of dermis presenting as a shallow open injury with a red, pink wound bed without slough.  Wound Description (Comments):   Present on Admission: Yes  Dressing Type Foam - Lift dressing to assess site every shift 12/18/21 2040     Pressure Injury 11/19/21 Sacrum Right Stage 2 -  Partial thickness loss of dermis presenting as a shallow open injury with a red, pink wound bed without slough. (Active)  11/19/21 1625  Location: Sacrum  Location Orientation: Right  Staging: Stage 2 -  Partial thickness loss of dermis presenting as a shallow open injury with a red, pink wound bed without slough.  Wound Description (Comments):   Present on Admission: Yes  Dressing Type Foam - Lift dressing to assess site every shift 12/19/21 0500    DVT prophylaxis: Eliquis Family Communication: None Status is: Inpatient Remains inpatient appropriate because: Acute respiratory failure with hypoxia of unclear etiology    Code Status:     Code Status Orders  (From admission, onward)           Start     Ordered   12/16/21 0046  Do not attempt resuscitation (DNR)  Continuous       Question Answer Comment  In the event of cardiac or respiratory ARREST Do not call a "code blue"   In the event of cardiac or respiratory ARREST Do not perform Intubation, CPR, defibrillation or ACLS   In the event of  cardiac or respiratory ARREST Use medication by any route, position, wound care, and other measures to relive pain and suffering. May use oxygen, suction and manual treatment of airway obstruction as needed for comfort.      12/16/21 0049           Code Status History     Date Active Date Inactive Code Status Order ID Comments User Context   11/19/2021 1532 11/30/2021 1527 Full Code [id number]  Elizabeth Sauer Inpatient   11/19/2021 1532 11/19/2021 1532 DNR [id number]  Cathlyn Parsons, PA-C Inpatient   11/11/2021 1629 11/19/2021 1509 DNR [id number]  Vernelle Emerald, MD Inpatient   11/04/2021 1707 11/11/2021 1629 Full Code [id number]  Irving Copas, PA-C Inpatient   08/18/2021 2015 08/22/2021 1938 Full Code [id number]  Kayleen Memos, DO ED   10/04/2019 2258 10/12/2019 2204 Full Code [id number]  Stark Klein, MD ED   12/23/2018 1823 12/24/2018 2142 Full Code [id number]  Norman Herrlich Inpatient   08/10/2018 0059 08/13/2018 2201 Full Code [id number]  Etta Quill, DO ED   01/03/2018 1913 01/10/2018 1713 Full Code [id number]  Nani Skillern, PA-C Inpatient   12/24/2017 1101 12/24/2017 1552 Full Code [id number]  Bensimhon, Shaune Pascal,  MD Inpatient         IV Access:   Peripheral IV   Procedures and diagnostic studies:   CT ABDOMEN PELVIS W CONTRAST  Result Date: 12/18/2021 CLINICAL DATA:  Lymphadenopathy, groin EXAM: CT ABDOMEN AND PELVIS WITH CONTRAST TECHNIQUE: Multidetector CT imaging of the abdomen and pelvis was performed using the standard protocol following bolus administration of intravenous contrast. RADIATION DOSE REDUCTION: This exam was performed according to the departmental dose-optimization program which includes automated exposure control, adjustment of the mA and/or kV according to patient size and/or use of iterative reconstruction technique. CONTRAST:  60m OMNIPAQUE IOHEXOL 350 MG/ML SOLN COMPARISON:  CT 11/11/2021. FINDINGS: Lower chest: Moderate  right pleural effusion with adjacent atelectasis, similar to prior. Decreased left pleural effusion with adjacent basilar airspace disease, either residual atelectasis or an infectious/inflammatory process. Cardiomegaly. Prior aortic valve replacement. Coronary artery atherosclerosis with prior CABG. Hepatobiliary: Mild contour nodularity of the liver, unchanged. Vicarious excretion of contrast in the gallbladder. The gallbladder is nondilated with a few intraluminal stones. Pancreas: Unremarkable. No pancreatic ductal dilatation or surrounding inflammatory changes. Spleen: Mild splenomegaly. Adrenals/Urinary Tract: Unchanged right adrenal adenoma. Renal vascular calcifications. No hydronephrosis or definite nephrolithiasis. Subcentimeter renal cysts are too small to characterize. Bladder is decompressed with Foley catheter in place. Stomach/Bowel: Oral contrast was administered. The stomach is within normal limits. There is no evidence of bowel obstruction. Prior appendectomy. Vascular/Lymphatic: Extensive aortoiliac atherosclerosis. No AAA. No pathologically enlarged lymph nodes in the abdomen or pelvis. Reproductive: Unremarkable. Other: Small to moderate volume abdominopelvic ascites, most of which is in the pelvis and small amount in the perihepatic and perisplenic areas. No bowel containing hernia. Musculoskeletal: Prior bilateral hip arthroplasties with associated streak artifact. No acute osseous abnormality. Vertebral body hemangiomas. No suspicious osseous lesion. Diffuse body wall edema, unchanged. IMPRESSION: No abdominopelvic lymphadenopathy. Decreased left pleural effusion with residual airspace disease in the left lower lobe, could be residual atelectasis or pneumonia. Unchanged moderate right pleural effusion with adjacent atelectasis. Small to moderate volume abdominopelvic ascites, most collecting in the pelvis, unchanged. Mild splenomegaly. Mild contour nodularity liver, may suggest chronic liver  disease. Unchanged diffuse body wall edema. Electronically Signed   By: JMaurine SimmeringM.D.   On: 12/18/2021 15:20   IR THORACENTESIS ASP PLEURAL SPACE W/IMG GUIDE  Result Date: 12/18/2021 INDICATION: Pleural effusion, shortness of breath EXAM: ULTRASOUND GUIDED LEFT THORACENTESIS MEDICATIONS: None. COMPLICATIONS: None immediate. PROCEDURE: An ultrasound guided thoracentesis was thoroughly discussed with the patient and questions answered. The benefits, risks, alternatives and complications were also discussed. The patient understands and wishes to proceed with the procedure. Written consent was obtained. Ultrasound was performed to localize and mark an adequate pocket of fluid in the left chest. The area was then prepped and draped in the normal sterile fashion. 1% Lidocaine was used for local anesthesia. Under ultrasound guidance a 6 Fr Safe-T-Centesis catheter was introduced. Thoracentesis was performed. The catheter was removed and a dressing applied. FINDINGS: A total of approximately 1.7L of pleural fluid was removed. Samples were sent to the laboratory as requested by the clinical team. IMPRESSION: Successful ultrasound guided left thoracentesis yielding 1.7L of pleural fluid. Electronically Signed   By: MJerilynn Mages  Shick M.D.   On: 12/18/2021 11:58   DG Chest 1 View  Result Date: 12/18/2021 CLINICAL DATA:  66year old male status post thoracentesis. EXAM: CHEST  1 VIEW COMPARISON:  Chest x-ray 12/16/2021. FINDINGS: There is cephalization of the pulmonary vasculature and slight indistinctness of the interstitial markings suggestive of mild  pulmonary edema. Small bilateral pleural effusions. No definite pneumothorax. Heart size is mildly enlarged. Upper mediastinal contours are within normal limits. Atherosclerotic calcifications in the thoracic aorta. Status post median sternotomy for CABG, aortic valve replacement (a stented bioprosthesis is noted) and left atrial appendage ligation (a ligation clip is  noted). IMPRESSION: 1. The appearance of the chest is most suggestive of congestive heart failure, as above. 2. No appreciable pneumothorax following thoracentesis. 3. Aortic atherosclerosis. Electronically Signed   By: Vinnie Langton M.D.   On: 12/18/2021 11:12   CT CHEST WO CONTRAST  Result Date: 12/17/2021 CLINICAL DATA:  Shortness of breath, chronic, unclear etiology. EXAM: CT CHEST WITHOUT CONTRAST TECHNIQUE: Multidetector CT imaging of the chest was performed following the standard protocol without IV contrast. RADIATION DOSE REDUCTION: This exam was performed according to the departmental dose-optimization program which includes automated exposure control, adjustment of the mA and/or kV according to patient size and/or use of iterative reconstruction technique. COMPARISON:  Chest radiography yesterday. Chest radiography 11/12/2021. CT chest 10/04/2019. FINDINGS: Cardiovascular: Previous median sternotomy and CABG. Chronic cardiomegaly. Mediastinum/Nodes: Mild prominence of the hilar and mediastinal lymph nodes, probably reactive to chronic congestive heart failure. Lungs/Pleura: Moderate bilateral pleural effusions layering dependently with dependent pulmonary atelectasis, particularly pronounced in the lower lobes. Coexistence pneumonia within the lower lobes is not excluded. There is background pulmonary emphysema and scarring, upper lobe predominant. Three or 4 6 mm nodular shadows in the right upper lobe not seen in 2021 could relate to inflammation or atelectasis. However, follow-up suggested when/if congestive heart failure is resolved. Upper Abdomen: No significant upper abdominal finding. Musculoskeletal: Chronic thoracic degenerative changes. IMPRESSION: 1. Moderate bilateral pleural effusions layering dependently with dependent pulmonary atelectasis, particularly pronounced in the lower lobes. Coexistence pneumonia within the lower lobes is not excluded. Findings consistent with chronic  congestive heart failure. 2. Background pulmonary emphysema and scarring, upper lobe predominant. 3. Mild prominence of the hilar and mediastinal lymph nodes, probably reactive to chronic congestive heart failure. 4. Three or four 6 mm nodular shadows in the right upper lobe not seen in 2021 could relate to inflammation or atelectasis. However, follow-up suggested when/if congestive heart failure is resolved. Non-contrast chest CT at 3-6 months would be recommended. If the nodules are stable at time of repeat CT, then future CT at 18-24 months (from today's scan) is considered optional for low-risk patients, but is recommended for high-risk patients. This recommendation follows the consensus statement: Guidelines for Management of Incidental Pulmonary Nodules Detected on CT Images: From the Fleischner Society 2017; Radiology 2017; 284:228-243. Emphysema (ICD10-J43.9). Aortic Atherosclerosis (ICD10-I70.0). Electronically Signed   By: Nelson Chimes M.D.   On: 12/17/2021 09:26     Medical Consultants:   None.   Subjective:    Justin [name] relates breathing is a lot better.  Objective:    Vitals:   12/18/21 1739 12/18/21 2017 12/19/21 0509 12/19/21 0755  BP: 99/72 112/69 106/64 (!) 99/55  Pulse: 78 94 78 78  Resp: '17 17 17 17  '$ Temp: (!) 97.3 F (36.3 C) 97.7 F (36.5 C) 98 F (36.7 C) 97.6 F (36.4 C)  TempSrc: Oral Oral Oral Oral  SpO2: 92% 91% 100% 98%  Weight:      Height:       SpO2: 98 % O2 Flow Rate (L/min): 4 L/min   Intake/Output Summary (Last 24 hours) at 12/19/2021 0808 Last data filed at 12/19/2021 0500 Gross per 24 hour  Intake 220 ml  Output 800 ml  Net -580 ml    Filed Weights   12/16/21 1753  Weight: 73.6 kg    Exam: General exam: In no acute distress. Respiratory system: Good air movement and clear to auscultation. Cardiovascular system: S1 & S2 heard, RRR.  Positive JVD Gastrointestinal system: Abdomen is nondistended, soft and nontender.   Extremities: No pedal edema. Skin: No rashes, lesions or ulcers Psychiatry: Judgement and insight appear normal. Mood & affect appropriate.   Data Reviewed:    Labs: Basic Metabolic Panel: Recent Labs  Lab 12/15/21 1419 12/16/21 0330 12/18/21 0522 12/18/21 0853 12/19/21 0337  NA 132*  --  134* 136 132*  K 3.7  --  3.4* 3.4* 4.3  CL 100  --  97* 101 102  CO2 23  --  '24 25 25  '$ GLUCOSE 79  --  111* 110* 99  BUN 15  --  '15 15 17  '$ CREATININE 1.01  --  0.90 0.87 0.86  CALCIUM 8.0*  --  8.3* 8.1* 8.0*  MG  --  1.7  --  1.6*  --     GFR Estimated Creatinine Clearance: 77.9 mL/min (by C-G formula based on SCr of 0.86 mg/dL). Liver Function Tests: Recent Labs  Lab 12/15/21 1419  AST 26  ALT 15  ALKPHOS 145*  BILITOT 1.5*  PROT 6.3*  ALBUMIN 2.5*    No results for input(s): "LIPASE", "AMYLASE" in the last 168 hours. Recent Labs  Lab 12/15/21 1745  AMMONIA 31    Coagulation profile No results for input(s): "INR", "PROTIME" in the last 168 hours. COVID-19 Labs  No results for input(s): "DDIMER", "FERRITIN", "LDH", "CRP" in the last 72 hours.  Lab Results  Component Value Date   SARSCOV2NAA NEGATIVE 10/12/2019   [place] NEGATIVE 10/04/2019   SARSCOV2NAA NOT DETECTED 12/20/2018   [place] NEGATIVE 08/10/2018    CBC: Recent Labs  Lab 12/15/21 1419 12/16/21 0330 12/17/21 1052 12/18/21 0522  WBC 29.0* 29.2* 31.0* 37.3*  NEUTROABS 25.7*  --  26.7* 32.9*  HGB 9.9* 9.7* 10.4* 10.8*  HCT 30.7* 29.3* 30.5* 32.2*  MCV 120.4* 119.6* 116.9* 119.3*  PLT 306 283 293 334    Cardiac Enzymes: Recent Labs  Lab 12/15/21 1419  CKTOTAL 64    BNP (last 3 results) No results for input(s): "PROBNP" in the last 8760 hours. CBG: No results for input(s): "GLUCAP" in the last 168 hours. D-Dimer: No results for input(s): "DDIMER" in the last 72 hours. Hgb A1c: No results for input(s): "HGBA1C" in the last 72 hours. Lipid Profile: No results for input(s):  "CHOL", "HDL", "LDLCALC", "TRIG", "CHOLHDL", "LDLDIRECT" in the last 72 hours. Thyroid function studies: No results for input(s): "TSH", "T4TOTAL", "T3FREE", "THYROIDAB" in the last 72 hours.  Invalid input(s): "FREET3" Anemia work up: Recent Labs    12/18/21 [place] 1,183*  FOLATE 9.2   Sepsis Labs: Recent Labs  Lab 12/15/21 1419 12/15/21 1730 12/15/21 1950 12/16/21 0330 12/17/21 1052 12/18/21 0522  WBC 29.0*  --   --  29.2* 31.0* 37.3*  LATICACIDVEN  --  1.3 1.1  --   --   --     Microbiology Recent Results (from the past 240 hour(s))  Blood culture (routine x 2)     Status: None (Preliminary result)   Collection Time: 12/15/21  5:25 PM   Specimen: BLOOD  Result Value Ref Range Status   Specimen Description BLOOD LEFT ANTECUBITAL  Final   Special Requests   Final    BOTTLES DRAWN AEROBIC AND  ANAEROBIC Blood Culture results may not be optimal due to an excessive volume of blood received in culture bottles   Culture   Final    NO GROWTH 4 DAYS Performed at [facility] [address] [place], [place] [address]    Report Status PENDING  Incomplete  Blood culture (routine x 2)     Status: None (Preliminary result)   Collection Time: 12/15/21  5:30 PM   Specimen: BLOOD  Result Value Ref Range Status   Specimen Description BLOOD RIGHT ANTECUBITAL  Final   Special Requests   Final    BOTTLES DRAWN AEROBIC AND ANAEROBIC Blood Culture adequate volume   Culture   Final    NO GROWTH 4 DAYS Performed at [facility], [place] [address] [place], [place] [address]    Report Status PENDING  Incomplete  Urine Culture     Status: Abnormal   Collection Time: 12/15/21  7:50 PM   Specimen: Urine, Clean Catch  Result Value Ref Range Status   Specimen Description URINE, CLEAN CATCH  Final   Special Requests   Final    NONE Performed at [facility], [place] [address] [place], [place] [address]    Culture >=100,000 COLONIES/mL PROTEUS MIRABILIS (A)   Final   Report Status 12/18/2021 FINAL  Final   Organism ID, Bacteria PROTEUS MIRABILIS (A)  Final      Susceptibility   Proteus mirabilis - MIC*    AMPICILLIN <=2 SENSITIVE Sensitive     CEFAZOLIN <=4 SENSITIVE Sensitive     CEFEPIME <=0.12 SENSITIVE Sensitive     CEFTRIAXONE <=0.25 SENSITIVE Sensitive     CIPROFLOXACIN <=0.25 SENSITIVE Sensitive     GENTAMICIN <=1 SENSITIVE Sensitive     IMIPENEM 2 SENSITIVE Sensitive     NITROFURANTOIN 128 RESISTANT Resistant     TRIMETH/SULFA <=20 SENSITIVE Sensitive     AMPICILLIN/SULBACTAM <=2 SENSITIVE Sensitive     PIP/TAZO <=4 SENSITIVE Sensitive     * >=100,000 COLONIES/mL PROTEUS MIRABILIS  MRSA culture     Status: None (Preliminary result)   Collection Time: 12/17/21 11:41 AM   Specimen: Nasal; Body Fluid  Result Value Ref Range Status   Specimen Description NASAL SWAB  Final   Special Requests NASAL SWAB  Final   Culture   Final    FEW STAPHYLOCOCCUS AUREUS SUSCEPTIBILITIES TO FOLLOW Performed at [facility], [address] [place], [place] [address]    Report Status PENDING  Incomplete  Culture, body fluid w Gram Stain-bottle     Status: None (Preliminary result)   Collection Time: 12/18/21 10:01 AM   Specimen: Pleura  Result Value Ref Range Status   Specimen Description PLEURAL  Final   Special Requests NONE  Final   Culture   Final    NO GROWTH < 24 HOURS Performed at [facility], [place] [address] [place], [place] [address]    Report Status PENDING  Incomplete  Gram stain     Status: None   Collection Time: 12/18/21 10:01 AM   Specimen: Pleura  Result Value Ref Range Status   Specimen Description PLEURAL  Final   Special Requests NONE  Final   Gram Stain   Final    NO WBC SEEN NO ORGANISMS SEEN Performed at [facility], [address] [place], [place] [address]    Report Status 12/18/2021 FINAL  Final     Medications:    (feeding supplement) PROSource Plus  30 mL Oral BID BM    azithromycin  500 mg Oral Daily   Chlorhexidine Gluconate Cloth  6 each Topical Daily   cholecalciferol  5,000 Units Oral Daily   ferrous sulfate  325 mg Oral Q breakfast   hydroxyurea  500 mg Oral BID   leptospermum manuka honey  1 Application Topical Daily   melatonin  3 mg Oral QHS   metoprolol tartrate  25 mg Oral BID   multivitamin with minerals  1 tablet Oral Daily   pantoprazole  40 mg Oral BID   potassium chloride  20 mEq Oral BID   rosuvastatin  20 mg Oral Daily   sucralfate  1 g Oral BID   tamsulosin  0.4 mg Oral QPC supper   Continuous Infusions:  cefTRIAXone (ROCEPHIN)  IV 2 g (12/18/21 1200)      LOS: 3 days   Charlynne Cousins  [facility]  12/19/2021, 8:08 AM

## 2021-12-20 DIAGNOSIS — N3001 Acute cystitis with hematuria: Secondary | ICD-10-CM | POA: Diagnosis not present

## 2021-12-20 DIAGNOSIS — G9341 Metabolic encephalopathy: Secondary | ICD-10-CM | POA: Diagnosis not present

## 2021-12-20 DIAGNOSIS — I5022 Chronic systolic (congestive) heart failure: Secondary | ICD-10-CM

## 2021-12-20 DIAGNOSIS — J9601 Acute respiratory failure with hypoxia: Secondary | ICD-10-CM

## 2021-12-20 DIAGNOSIS — S31809A Unspecified open wound of unspecified buttock, initial encounter: Secondary | ICD-10-CM

## 2021-12-20 DIAGNOSIS — R5381 Other malaise: Secondary | ICD-10-CM

## 2021-12-20 LAB — BASIC METABOLIC PANEL
Anion gap: 9 (ref 5–15)
BUN: 18 mg/dL (ref 8–23)
CO2: 28 mmol/L (ref 22–32)
Calcium: 8.4 mg/dL — ABNORMAL LOW (ref 8.9–10.3)
Chloride: 99 mmol/L (ref 98–111)
Creatinine, Ser: 0.88 mg/dL (ref 0.61–1.24)
GFR, Estimated: >60 mL/min (ref >60)
Glucose, Bld: 98 mg/dL (ref 70–99)
Potassium: 4.1 mmol/L (ref 3.5–5.1)
Sodium: 136 mmol/L (ref 135–145)

## 2021-12-20 LAB — CULTURE, BLOOD (ROUTINE X 2)
Culture: NO GROWTH
Culture: NO GROWTH
Special Requests: ADEQUATE

## 2021-12-20 MED ORDER — CEPHALEXIN 500 MG PO CAPS
500.0000 mg | ORAL_CAPSULE | Freq: Three times a day (TID) | ORAL | Status: AC
  Administered 2021-12-20 – 2021-12-22 (×9): 500 mg via ORAL
  Filled 2021-12-20 (×9): qty 1

## 2021-12-20 MED ORDER — CEPHALEXIN 500 MG PO CAPS: 500.0000 mg | ORAL_CAPSULE | Freq: Three times a day (TID) | ORAL | Status: DC

## 2021-12-20 MED ORDER — APIXABAN 5 MG PO TABS
5.0000 mg | ORAL_TABLET | Freq: Two times a day (BID) | ORAL | Status: DC
  Administered 2021-12-20 – 2021-12-24 (×9): 5 mg via ORAL
  Filled 2021-12-20 (×9): qty 1

## 2021-12-20 NOTE — Progress Notes (Signed)
[facility] PROGRESS NOTE    Progress Note  Justin [name]  HQR:[id number] DOB: [birth year] DOA: 12/15/2021 PCP: [name], MD     Brief Narrative:   Justin [name] is an 66 y.o. male past medical history of essential thrombocythemia, chronic atrial fibrillation on Eliquis, systolic heart failure with an EF of 45% dural venous thrombosis, aortic stenosis with a history of aortic valve replacement, CAD status post CABG, essential hypertension, diabetes mellitus type 2 chronic systolic heart failure BPH with a left total hip replacement on November 04, 2021 comes in to the ED for generalized weakness confusion for 1 week, he was also noted to have a pressure ulcer on the left buttock and heel, white count of 29,000 CT of the head showed no acute findings.    Assessment/Plan:   Acute respiratory failure with hypoxia: Currently on 1 L of oxygen satting greater 95%. CT scan showed moderate bilateral pleural effusion with coexisting lymphadenopathy. IR was consulted thoracocentesis performed remove 1.7 L, repeated chest x-ray postprocedure showed no pneumothorax. De-escalate antibiotics to oral Keflex for possible UTI.  Urine culture grew 100,000 colonies of Proteus Continue IV Lasix is negative about 3 L monitor strict I's and O's and daily weights.  Monitor electrolytes and replete as needed. Physical therapy evaluated the patient, will need skilled nursing facility  Leukocytosis possibly due to leukemia he relates he is being followed up by hematologist at [facility] as an outpatient: He relates he has a history of leukemia which is being followed up at [facility].  There is no record of this in the chart or care everywhere he continues to improve clinically. CT chest showed possible pneumonia 6 mm nodules 3-4. CT scan of the abdomen pelvis showed no lymphadenopathy showed mild ascites and splenomegaly.  UTI due to indwelling foley cath:  UC grew proteus  mirabalis. Continue Flomax, de-escalate to Keflex.  Acute metabolic encephalopathy: Of unclear etiology, possibly due to hypoxia now resolved.  Acute on chronic systolic heart failure: Neck veins are down, continues to have lower extremity edema continue IV Lasix diuresing well. As regards and is on daily weights monitor electrolytes replete as needed.  Chronic atrial fibrillation: Continue metoprolol. Resume Eliquis.  Cranial sinus thrombosis: CT of the head unchanged. Resume Eliquis  Essential thrombocythemia: Continue Hydrea  Macrocytic anemia: Folate and B12 unremarkable follow-up with hematology as an outpatient  Severe protein malnutrition: Ensure TID.  Stage II pressure decubitus ulcer and left heel: Continue wound care. RN Pressure Injury Documentation: Pressure Injury 11/17/21 Heel Left Unstageable - Full thickness tissue loss in which the base of the injury is covered by slough (yellow, tan, gray, green or brown) and/or eschar (tan, brown or black) in the wound bed. pressure injury (Active)  11/17/21 0612  Location: Heel  Location Orientation: Left  Staging: Unstageable - Full thickness tissue loss in which the base of the injury is covered by slough (yellow, tan, gray, green or brown) and/or eschar (tan, brown or black) in the wound bed.  Wound Description (Comments): pressure injury  Present on Admission: Yes  Dressing Type Foam - Lift dressing to assess site every shift 12/19/21 0500     Pressure Injury 11/19/21 Coccyx Mid;Lower Stage 2 -  Partial thickness loss of dermis presenting as a shallow open injury with a red, pink wound bed without slough. (Active)  11/19/21 1625  Location: Coccyx  Location Orientation: Mid;Lower  Staging: Stage 2 -  Partial thickness loss of dermis presenting as a shallow open  injury with a red, pink wound bed without slough.  Wound Description (Comments):   Present on Admission: Yes  Dressing Type Foam - Lift dressing to assess  site every shift 12/19/21 1800     Pressure Injury 11/19/21 Sacrum Left Stage 2 -  Partial thickness loss of dermis presenting as a shallow open injury with a red, pink wound bed without slough. (Active)  11/19/21 1625  Location: Sacrum  Location Orientation: Left  Staging: Stage 2 -  Partial thickness loss of dermis presenting as a shallow open injury with a red, pink wound bed without slough.  Wound Description (Comments):   Present on Admission: Yes  Dressing Type Foam - Lift dressing to assess site every shift 12/18/21 2040     Pressure Injury 11/19/21 Sacrum Right Stage 2 -  Partial thickness loss of dermis presenting as a shallow open injury with a red, pink wound bed without slough. (Active)  11/19/21 1625  Location: Sacrum  Location Orientation: Right  Staging: Stage 2 -  Partial thickness loss of dermis presenting as a shallow open injury with a red, pink wound bed without slough.  Wound Description (Comments):   Present on Admission: Yes  Dressing Type Foam - Lift dressing to assess site every shift 12/19/21 0500    DVT prophylaxis: Eliquis Family Communication: None Status is: Inpatient Remains inpatient appropriate because: Acute respiratory failure with hypoxia of unclear etiology    Code Status:     Code Status Orders  (From admission, onward)           Start     Ordered   12/16/21 0046  Do not attempt resuscitation (DNR)  Continuous       Question Answer Comment  In the event of cardiac or respiratory ARREST Do not call a "code blue"   In the event of cardiac or respiratory ARREST Do not perform Intubation, CPR, defibrillation or ACLS   In the event of cardiac or respiratory ARREST Use medication by any route, position, wound care, and other measures to relive pain and suffering. May use oxygen, suction and manual treatment of airway obstruction as needed for comfort.      12/16/21 0049           Code Status History     Date Active Date Inactive  Code Status Order ID Comments User Context   11/19/2021 1532 11/30/2021 1527 Full Code [id number]  Elizabeth Sauer Inpatient   11/19/2021 1532 11/19/2021 1532 DNR [id number]  Cathlyn Parsons, PA-C Inpatient   11/11/2021 1629 11/19/2021 1509 DNR [id number]  Vernelle Emerald, MD Inpatient   11/04/2021 1707 11/11/2021 1629 Full Code [id number]  Irving Copas, PA-C Inpatient   08/18/2021 2015 08/22/2021 1938 Full Code [id number]  Kayleen Memos, DO ED   10/04/2019 2258 10/12/2019 2204 Full Code [id number]  Stark Klein, MD ED   12/23/2018 1823 12/24/2018 2142 Full Code [id number]  Norman Herrlich Inpatient   08/10/2018 0059 08/13/2018 2201 Full Code [id number]  Etta Quill, DO ED   01/03/2018 1913 01/10/2018 1713 Full Code [id number]  Nani Skillern, PA-C Inpatient   12/24/2017 1101 12/24/2017 1552 Full Code [id number]  Bensimhon, Shaune Pascal, MD Inpatient         IV Access:   Peripheral IV   Procedures and diagnostic studies:   CT ABDOMEN PELVIS W CONTRAST  Result Date: 12/18/2021 CLINICAL DATA:  Lymphadenopathy, groin EXAM: CT ABDOMEN AND PELVIS WITH CONTRAST TECHNIQUE: Multidetector CT imaging of the abdomen  and pelvis was performed using the standard protocol following bolus administration of intravenous contrast. RADIATION DOSE REDUCTION: This exam was performed according to the departmental dose-optimization program which includes automated exposure control, adjustment of the mA and/or kV according to patient size and/or use of iterative reconstruction technique. CONTRAST:  88m OMNIPAQUE IOHEXOL 350 MG/ML SOLN COMPARISON:  CT 11/11/2021. FINDINGS: Lower chest: Moderate right pleural effusion with adjacent atelectasis, similar to prior. Decreased left pleural effusion with adjacent basilar airspace disease, either residual atelectasis or an infectious/inflammatory process. Cardiomegaly. Prior aortic valve replacement. Coronary artery atherosclerosis with prior CABG.  Hepatobiliary: Mild contour nodularity of the liver, unchanged. Vicarious excretion of contrast in the gallbladder. The gallbladder is nondilated with a few intraluminal stones. Pancreas: Unremarkable. No pancreatic ductal dilatation or surrounding inflammatory changes. Spleen: Mild splenomegaly. Adrenals/Urinary Tract: Unchanged right adrenal adenoma. Renal vascular calcifications. No hydronephrosis or definite nephrolithiasis. Subcentimeter renal cysts are too small to characterize. Bladder is decompressed with Foley catheter in place. Stomach/Bowel: Oral contrast was administered. The stomach is within normal limits. There is no evidence of bowel obstruction. Prior appendectomy. Vascular/Lymphatic: Extensive aortoiliac atherosclerosis. No AAA. No pathologically enlarged lymph nodes in the abdomen or pelvis. Reproductive: Unremarkable. Other: Small to moderate volume abdominopelvic ascites, most of which is in the pelvis and small amount in the perihepatic and perisplenic areas. No bowel containing hernia. Musculoskeletal: Prior bilateral hip arthroplasties with associated streak artifact. No acute osseous abnormality. Vertebral body hemangiomas. No suspicious osseous lesion. Diffuse body wall edema, unchanged. IMPRESSION: No abdominopelvic lymphadenopathy. Decreased left pleural effusion with residual airspace disease in the left lower lobe, could be residual atelectasis or pneumonia. Unchanged moderate right pleural effusion with adjacent atelectasis. Small to moderate volume abdominopelvic ascites, most collecting in the pelvis, unchanged. Mild splenomegaly. Mild contour nodularity liver, may suggest chronic liver disease. Unchanged diffuse body wall edema. Electronically Signed   By: JMaurine SimmeringM.D.   On: 12/18/2021 15:20   IR THORACENTESIS ASP PLEURAL SPACE W/IMG GUIDE  Result Date: 12/18/2021 INDICATION: Pleural effusion, shortness of breath EXAM: ULTRASOUND GUIDED LEFT THORACENTESIS MEDICATIONS: None.  COMPLICATIONS: None immediate. PROCEDURE: An ultrasound guided thoracentesis was thoroughly discussed with the patient and questions answered. The benefits, risks, alternatives and complications were also discussed. The patient understands and wishes to proceed with the procedure. Written consent was obtained. Ultrasound was performed to localize and mark an adequate pocket of fluid in the left chest. The area was then prepped and draped in the normal sterile fashion. 1% Lidocaine was used for local anesthesia. Under ultrasound guidance a 6 Fr Safe-T-Centesis catheter was introduced. Thoracentesis was performed. The catheter was removed and a dressing applied. FINDINGS: A total of approximately 1.7L of pleural fluid was removed. Samples were sent to the laboratory as requested by the clinical team. IMPRESSION: Successful ultrasound guided left thoracentesis yielding 1.7L of pleural fluid. Electronically Signed   By: MJerilynn Mages  Shick M.D.   On: 12/18/2021 11:58   DG Chest 1 View  Result Date: 12/18/2021 CLINICAL DATA:  66year old male status post thoracentesis. EXAM: CHEST  1 VIEW COMPARISON:  Chest x-ray 12/16/2021. FINDINGS: There is cephalization of the pulmonary vasculature and slight indistinctness of the interstitial markings suggestive of mild pulmonary edema. Small bilateral pleural effusions. No definite pneumothorax. Heart size is mildly enlarged. Upper mediastinal contours are within normal limits. Atherosclerotic calcifications in the thoracic aorta. Status post median sternotomy for CABG, aortic valve replacement (a stented bioprosthesis is noted) and left atrial appendage ligation (a ligation clip is noted). IMPRESSION:  1. The appearance of the chest is most suggestive of congestive heart failure, as above. 2. No appreciable pneumothorax following thoracentesis. 3. Aortic atherosclerosis. Electronically Signed   By: Vinnie Langton M.D.   On: 12/18/2021 11:12     Medical Consultants:    None.   Subjective:    Justin [name] relates his is a lot better.  Objective:    Vitals:   12/19/21 1732 12/19/21 1732 12/19/21 2145 12/20/21 0516  BP: 103/60 103/60 103/64 109/69  Pulse: 78 75 69 76  Resp: '17 17 17 17  '$ Temp: 97.9 F (36.6 C) 97.9 F (36.6 C) 97.9 F (36.6 C) 97.6 F (36.4 C)  TempSrc: Oral Oral Oral Oral  SpO2: 95% 95% 95% 95%  Weight:      Height:       SpO2: 95 % O2 Flow Rate (L/min): 4 L/min   Intake/Output Summary (Last 24 hours) at 12/20/2021 0750 Last data filed at 12/19/2021 1740 Gross per 24 hour  Intake 467.32 ml  Output 650 ml  Net -182.68 ml    Filed Weights   12/16/21 1753  Weight: 73.6 kg    Exam: General exam: In no acute distress. Respiratory system: Good air movement and clear to auscultation. Cardiovascular system: S1 & S2 heard, RRR. No JVD. Gastrointestinal system: Abdomen is nondistended, soft and nontender.  Extremities: Lower extremity edema. Skin: No rashes, lesions or ulcers Psychiatry: Judgement and insight appear normal. Mood & affect appropriate.  Data Reviewed:    Labs: Basic Metabolic Panel: Recent Labs  Lab 12/15/21 1419 12/16/21 0330 12/18/21 0522 12/18/21 0853 12/19/21 0337 12/20/21 0154  NA 132*  --  134* 136 132* 136  K 3.7  --  3.4* 3.4* 4.3 4.1  CL 100  --  97* 101 102 99  CO2 23  --  '24 25 25 28  '$ GLUCOSE 79  --  111* 110* 99 98  BUN 15  --  '15 15 17 18  '$ CREATININE 1.01  --  0.90 0.87 0.86 0.88  CALCIUM 8.0*  --  8.3* 8.1* 8.0* 8.4*  MG  --  1.7  --  1.6*  --   --     GFR Estimated Creatinine Clearance: 76.2 mL/min (by C-G formula based on SCr of 0.88 mg/dL). Liver Function Tests: Recent Labs  Lab 12/15/21 1419  AST 26  ALT 15  ALKPHOS 145*  BILITOT 1.5*  PROT 6.3*  ALBUMIN 2.5*    No results for input(s): "LIPASE", "AMYLASE" in the last 168 hours. Recent Labs  Lab 12/15/21 1745  AMMONIA 31    Coagulation profile No results for input(s): "INR", "PROTIME" in  the last 168 hours. COVID-19 Labs  No results for input(s): "DDIMER", "FERRITIN", "LDH", "CRP" in the last 72 hours.  Lab Results  Component Value Date   SARSCOV2NAA NEGATIVE 10/12/2019   [place] NEGATIVE 10/04/2019   SARSCOV2NAA NOT DETECTED 12/20/2018   [place] NEGATIVE 08/10/2018    CBC: Recent Labs  Lab 12/15/21 1419 12/16/21 0330 12/17/21 1052 12/18/21 0522  WBC 29.0* 29.2* 31.0* 37.3*  NEUTROABS 25.7*  --  26.7* 32.9*  HGB 9.9* 9.7* 10.4* 10.8*  HCT 30.7* 29.3* 30.5* 32.2*  MCV 120.4* 119.6* 116.9* 119.3*  PLT 306 283 293 334    Cardiac Enzymes: Recent Labs  Lab 12/15/21 1419  CKTOTAL 64    BNP (last 3 results) No results for input(s): "PROBNP" in the last 8760 hours. CBG: No results for input(s): "GLUCAP" in the last 168 hours. D-Dimer:  No results for input(s): "DDIMER" in the last 72 hours. Hgb A1c: No results for input(s): "HGBA1C" in the last 72 hours. Lipid Profile: No results for input(s): "CHOL", "HDL", "LDLCALC", "TRIG", "CHOLHDL", "LDLDIRECT" in the last 72 hours. Thyroid function studies: No results for input(s): "TSH", "T4TOTAL", "T3FREE", "THYROIDAB" in the last 72 hours.  Invalid input(s): "FREET3" Anemia work up: Recent Labs    12/18/21 [place] 1,183*  FOLATE 9.2    Sepsis Labs: Recent Labs  Lab 12/15/21 1419 12/15/21 1730 12/15/21 1950 12/16/21 0330 12/17/21 1052 12/18/21 0522  WBC 29.0*  --   --  29.2* 31.0* 37.3*  LATICACIDVEN  --  1.3 1.1  --   --   --     Microbiology Recent Results (from the past 240 hour(s))  Blood culture (routine x 2)     Status: None   Collection Time: 12/15/21  5:25 PM   Specimen: BLOOD  Result Value Ref Range Status   Specimen Description BLOOD LEFT ANTECUBITAL  Final   Special Requests   Final    BOTTLES DRAWN AEROBIC AND ANAEROBIC Blood Culture results may not be optimal due to an excessive volume of blood received in culture bottles   Culture   Final    NO GROWTH 5  DAYS Performed at [facility], [place] [address] [place], [place] [address]    Report Status 12/20/2021 FINAL  Final  Blood culture (routine x 2)     Status: None   Collection Time: 12/15/21  5:30 PM   Specimen: BLOOD  Result Value Ref Range Status   Specimen Description BLOOD RIGHT ANTECUBITAL  Final   Special Requests   Final    BOTTLES DRAWN AEROBIC AND ANAEROBIC Blood Culture adequate volume   Culture   Final    NO GROWTH 5 DAYS Performed at [facility], [place] [address], [place], [place] [address]    Report Status 12/20/2021 FINAL  Final  Urine Culture     Status: Abnormal   Collection Time: 12/15/21  7:50 PM   Specimen: Urine, Clean Catch  Result Value Ref Range Status   Specimen Description URINE, CLEAN CATCH  Final   Special Requests   Final    NONE Performed at [facility], [place] [address] [place], [place] [address]    Culture >=100,000 COLONIES/mL PROTEUS MIRABILIS (A)  Final   Report Status 12/18/2021 FINAL  Final   Organism ID, Bacteria PROTEUS MIRABILIS (A)  Final      Susceptibility   Proteus mirabilis - MIC*    AMPICILLIN <=2 SENSITIVE Sensitive     CEFAZOLIN <=4 SENSITIVE Sensitive     CEFEPIME <=0.12 SENSITIVE Sensitive     CEFTRIAXONE <=0.25 SENSITIVE Sensitive     CIPROFLOXACIN <=0.25 SENSITIVE Sensitive     GENTAMICIN <=1 SENSITIVE Sensitive     IMIPENEM 2 SENSITIVE Sensitive     NITROFURANTOIN 128 RESISTANT Resistant     TRIMETH/SULFA <=20 SENSITIVE Sensitive     AMPICILLIN/SULBACTAM <=2 SENSITIVE Sensitive     PIP/TAZO <=4 SENSITIVE Sensitive     * >=100,000 COLONIES/mL PROTEUS MIRABILIS  MRSA culture     Status: None   Collection Time: 12/17/21 11:41 AM   Specimen: Nasal; Body Fluid  Result Value Ref Range Status   Specimen Description NASAL SWAB  Final   Special Requests   Final    NASAL SWAB Performed at [facility], [place] [address] [place], [place] [address]    Culture  FEW STAPHYLOCOCCUS AUREUS  Final   Report  Status 12/19/2021 FINAL  Final   Organism ID, Bacteria STAPHYLOCOCCUS AUREUS  Final      Susceptibility   Staphylococcus aureus - MIC*    CIPROFLOXACIN <=0.5 SENSITIVE Sensitive     ERYTHROMYCIN <=0.25 SENSITIVE Sensitive     GENTAMICIN <=0.5 SENSITIVE Sensitive     OXACILLIN <=0.25 SENSITIVE Sensitive     TETRACYCLINE <=1 SENSITIVE Sensitive     VANCOMYCIN <=0.5 SENSITIVE Sensitive     TRIMETH/SULFA <=10 SENSITIVE Sensitive     CLINDAMYCIN <=0.25 SENSITIVE Sensitive     RIFAMPIN <=0.5 SENSITIVE Sensitive     Inducible Clindamycin NEGATIVE Sensitive     * FEW STAPHYLOCOCCUS AUREUS  Culture, body fluid w Gram Stain-bottle     Status: None (Preliminary result)   Collection Time: 12/18/21 10:01 AM   Specimen: Pleura  Result Value Ref Range Status   Specimen Description PLEURAL  Final   Special Requests NONE  Final   Culture   Final    NO GROWTH 2 DAYS Performed at [facility], [place] [address] [place], [place] [address]    Report Status PENDING  Incomplete  Gram stain     Status: None   Collection Time: 12/18/21 10:01 AM   Specimen: Pleura  Result Value Ref Range Status   Specimen Description PLEURAL  Final   Special Requests NONE  Final   Gram Stain   Final    NO WBC SEEN NO ORGANISMS SEEN Performed at [facility], [address] [place],  [address]    Report Status 12/18/2021 FINAL  Final     Medications:    (feeding supplement) PROSource Plus  30 mL Oral BID BM   apixaban  5 mg Oral BID   azithromycin  500 mg Oral Daily   cephALEXin  500 mg Oral Q8H   Chlorhexidine Gluconate Cloth  6 each Topical Daily   cholecalciferol  5,000 Units Oral Daily   ferrous sulfate  325 mg Oral Q breakfast   furosemide  40 mg Intravenous Q12H   hydroxyurea  500 mg Oral BID   leptospermum manuka honey  1 Application Topical Daily   melatonin  3 mg Oral QHS   metoprolol tartrate  25 mg Oral BID   multivitamin with minerals  1 tablet Oral Daily   pantoprazole  40  mg Oral BID   potassium chloride  20 mEq Oral BID   rosuvastatin  20 mg Oral Daily   sucralfate  1 g Oral BID   tamsulosin  0.4 mg Oral QPC supper   Continuous Infusions:  sodium chloride Stopped (12/19/21 1340)      LOS: 4 days   Charlynne Cousins  [facility]  12/20/2021, 7:50 AM

## 2021-12-21 DIAGNOSIS — S31809A Unspecified open wound of unspecified buttock, initial encounter: Secondary | ICD-10-CM | POA: Diagnosis not present

## 2021-12-21 DIAGNOSIS — J9601 Acute respiratory failure with hypoxia: Secondary | ICD-10-CM | POA: Diagnosis not present

## 2021-12-21 DIAGNOSIS — G9341 Metabolic encephalopathy: Secondary | ICD-10-CM | POA: Diagnosis not present

## 2021-12-21 DIAGNOSIS — N3001 Acute cystitis with hematuria: Secondary | ICD-10-CM | POA: Diagnosis not present

## 2021-12-21 LAB — BASIC METABOLIC PANEL
Anion gap: 10 (ref 5–15)
BUN: 24 mg/dL — ABNORMAL HIGH (ref 8–23)
CO2: 27 mmol/L (ref 22–32)
Calcium: 8.4 mg/dL — ABNORMAL LOW (ref 8.9–10.3)
Chloride: 98 mmol/L (ref 98–111)
Creatinine, Ser: 1 mg/dL (ref 0.61–1.24)
GFR, Estimated: >60 mL/min (ref >60)
Glucose, Bld: 111 mg/dL — ABNORMAL HIGH (ref 70–99)
Potassium: 3.7 mmol/L (ref 3.5–5.1)
Sodium: 135 mmol/L (ref 135–145)

## 2021-12-21 MED ORDER — ZOLPIDEM TARTRATE 5 MG PO TABS
10.0000 mg | ORAL_TABLET | Freq: Every day | ORAL | Status: DC
  Administered 2021-12-21 – 2021-12-23 (×3): 10 mg via ORAL
  Filled 2021-12-21 (×3): qty 2

## 2021-12-21 MED ORDER — POTASSIUM CHLORIDE CRYS ER 10 MEQ PO TBCR
10.0000 meq | EXTENDED_RELEASE_TABLET | Freq: Every day | ORAL | Status: DC
  Administered 2021-12-21 – 2021-12-22 (×2): 10 meq via ORAL
  Filled 2021-12-21 (×2): qty 1

## 2021-12-21 MED ORDER — FUROSEMIDE 20 MG PO TABS
20.0000 mg | ORAL_TABLET | Freq: Every day | ORAL | Status: DC
  Administered 2021-12-21 – 2021-12-22 (×2): 20 mg via ORAL
  Filled 2021-12-21 (×2): qty 1

## 2021-12-21 MED ORDER — TAMSULOSIN HCL 0.4 MG PO CAPS
0.4000 mg | ORAL_CAPSULE | Freq: Every day | ORAL | Status: DC
  Administered 2021-12-21 – 2021-12-24 (×4): 0.4 mg via ORAL
  Filled 2021-12-21 (×4): qty 1

## 2021-12-21 NOTE — Plan of Care (Signed)
  Problem: Clinical Measurements: Goal: Will remain free from infection Outcome: Progressing   Problem: Clinical Measurements: Goal: Diagnostic test results will improve Outcome: Progressing   Problem: Clinical Measurements: Goal: Respiratory complications will improve Outcome: Progressing   Problem: Activity: Goal: Risk for activity intolerance will decrease Outcome: Progressing   Problem: Nutrition: Goal: Adequate nutrition will be maintained Outcome: Progressing   Problem: Coping: Goal: Level of anxiety will decrease Outcome: Progressing

## 2021-12-21 NOTE — Progress Notes (Signed)
[facility] PROGRESS NOTE    Progress Note  Justin [name] Cong  HQI:[id number] DOB: 02-25-1955 DOA: 12/15/2021 PCP: [name], MD     Brief Narrative:   Justin [name] is an 66 y.o. male past medical history of essential thrombocythemia, chronic atrial fibrillation on Eliquis, systolic heart failure with an EF of 45% dural venous thrombosis, aortic stenosis with a history of aortic valve replacement, CAD status post CABG, essential hypertension, diabetes mellitus type 2 chronic systolic heart failure BPH with a left total hip replacement on November 04, 2021 comes in to the ED for generalized weakness confusion for 1 week, he was also noted to have a pressure ulcer on the left buttock and heel, white count of 29,000 CT of the head showed no acute findings.  Assessment/Plan:   Acute respiratory failure with hypoxia: Continue oral Keflex for possible UTI urine culture grew Proteus. Transition Lasix to oral. Monitor strict I's and O's and daily weights Physical therapy evaluated the patient, will need skilled nursing facility Medically stable for transfer to skilled nursing facility.  Leukocytosis possibly due to leukemia he relates he is being followed up by hematologist at [facility] as an outpatient: He relates he has a history of leukemia which is being followed up at [facility].  There is no record of this in the chart or care everywhere he continues to improve clinically. CT chest showed possible pneumonia 6 mm nodules 3-4.  Will need to be followed up as an outpatient CT scan of the abdomen pelvis showed no lymphadenopathy showed mild ascites and splenomegaly.  UTI due to indwelling foley cath:  UC grew proteus mirabalis. Continue Flomax, continue Keflex  Acute metabolic encephalopathy: Of unclear etiology, possibly due to hypoxia now resolved.  Acute on chronic systolic heart failure: Blood pressure is borderline low hold IV diuretics transition him to oral  Lasix. Continue metoprolol  Chronic atrial fibrillation: Continue metoprolol. Resume Eliquis.  Cranial sinus thrombosis: CT of the head unchanged. Resume Eliquis  Essential thrombocythemia: Continue Hydrea  Macrocytic anemia: Folate and B12 unremarkable follow-up with hematology as an outpatient  Severe protein malnutrition: Ensure TID.  Stage II pressure decubitus ulcer and left heel: Continue wound care. RN Pressure Injury Documentation: Pressure Injury 11/17/21 Heel Left Unstageable - Full thickness tissue loss in which the base of the injury is covered by slough (yellow, tan, gray, green or brown) and/or eschar (tan, brown or black) in the wound bed. pressure injury (Active)  11/17/21 0612  Location: Heel  Location Orientation: Left  Staging: Unstageable - Full thickness tissue loss in which the base of the injury is covered by slough (yellow, tan, gray, green or brown) and/or eschar (tan, brown or black) in the wound bed.  Wound Description (Comments): pressure injury  Present on Admission: Yes  Dressing Type Honey;Foam - Lift dressing to assess site every shift 12/20/21 2144     Pressure Injury 11/19/21 Coccyx Mid;Lower Stage 2 -  Partial thickness loss of dermis presenting as a shallow open injury with a red, pink wound bed without slough. (Active)  11/19/21 1625  Location: Coccyx  Location Orientation: Mid;Lower  Staging: Stage 2 -  Partial thickness loss of dermis presenting as a shallow open injury with a red, pink wound bed without slough.  Wound Description (Comments):   Present on Admission: Yes  Dressing Type Foam - Lift dressing to assess site every shift 12/20/21 2144     Pressure Injury 11/19/21 Sacrum Left Stage 2 -  Partial thickness  loss of dermis presenting as a shallow open injury with a red, pink wound bed without slough. (Active)  11/19/21 1625  Location: Sacrum  Location Orientation: Left  Staging: Stage 2 -  Partial thickness loss of dermis  presenting as a shallow open injury with a red, pink wound bed without slough.  Wound Description (Comments):   Present on Admission: Yes  Dressing Type Gauze (Comment) 12/20/21 2144     Pressure Injury 11/19/21 Sacrum Right Stage 2 -  Partial thickness loss of dermis presenting as a shallow open injury with a red, pink wound bed without slough. (Active)  11/19/21 1625  Location: Sacrum  Location Orientation: Right  Staging: Stage 2 -  Partial thickness loss of dermis presenting as a shallow open injury with a red, pink wound bed without slough.  Wound Description (Comments):   Present on Admission: Yes  Dressing Type Foam - Lift dressing to assess site every shift 12/20/21 2144    DVT prophylaxis: Eliquis Family Communication: None Status is: Inpatient Remains inpatient appropriate because: Acute respiratory failure with hypoxia of unclear etiology    Code Status:     Code Status Orders  (From admission, onward)           Start     Ordered   12/16/21 0046  Do not attempt resuscitation (DNR)  Continuous       Question Answer Comment  In the event of cardiac or respiratory ARREST Do not call a "code blue"   In the event of cardiac or respiratory ARREST Do not perform Intubation, CPR, defibrillation or ACLS   In the event of cardiac or respiratory ARREST Use medication by any route, position, wound care, and other measures to relive pain and suffering. May use oxygen, suction and manual treatment of airway obstruction as needed for comfort.      12/16/21 0049           Code Status History     Date Active Date Inactive Code Status Order ID Comments User Context   11/19/2021 1532 11/30/2021 1527 Full Code [id number]  Elizabeth Sauer Inpatient   11/19/2021 1532 11/19/2021 1532 DNR [id number]  Cathlyn Parsons, PA-C Inpatient   11/11/2021 1629 11/19/2021 1509 DNR [id number]  Vernelle Emerald, MD Inpatient   11/04/2021 1707 11/11/2021 1629 Full Code [id number]   Irving Copas, PA-C Inpatient   08/18/2021 2015 08/22/2021 1938 Full Code [id number]  Kayleen Memos, DO ED   10/04/2019 2258 10/12/2019 2204 Full Code [id number]  Stark Klein, MD ED   12/23/2018 1823 12/24/2018 2142 Full Code [id number]  Norman Herrlich Inpatient   08/10/2018 0059 08/13/2018 2201 Full Code [id number]  Etta Quill, DO ED   01/03/2018 1913 01/10/2018 1713 Full Code [id number]  Nani Skillern, PA-C Inpatient   12/24/2017 1101 12/24/2017 1552 Full Code [id number]  Bensimhon, Shaune Pascal, MD Inpatient         IV Access:   Peripheral IV   Procedures and diagnostic studies:   No results found.   Medical Consultants:   None.   Subjective:    Justin [name] no complaints today.  Objective:    Vitals:   12/20/21 0832 12/20/21 1556 12/20/21 2112 12/21/21 0610  BP: 104/60 106/80 100/61 (!) 97/53  Pulse: 82 75 69 75  Resp: '17 18 18 16  '$ Temp: (!) 97.5 F (36.4 C) 98 F (36.7 C) 98.2 F (36.8 C) 97.8 F (36.6 C)  TempSrc: Oral Oral Oral Oral  SpO2: (!) 86% 96% 96% 94%  Weight:      Height:       SpO2: 94 % O2 Flow Rate (L/min): 1 L/min   Intake/Output Summary (Last 24 hours) at 12/21/2021 0736 Last data filed at 12/20/2021 2144 Gross per 24 hour  Intake 720 ml  Output 125 ml  Net 595 ml    Filed Weights   12/16/21 1753  Weight: 73.6 kg    Exam: General exam: In no acute distress. Respiratory system: Good air movement and clear to auscultation. Cardiovascular system: S1 & S2 heard, RRR. No JVD. Gastrointestinal system: Abdomen is nondistended, soft and nontender.  Extremities: No pedal edema. Skin: No rashes, lesions or ulcers Psychiatry: Judgement and insight appear normal. Mood & affect appropriate.  Data Reviewed:    Labs: Basic Metabolic Panel: Recent Labs  Lab 12/16/21 0330 12/18/21 0522 12/18/21 0853 12/19/21 0337 12/20/21 0154 12/21/21 0131  NA  --  134* 136 132* 136 135  K  --  3.4* 3.4* 4.3 4.1 3.7  CL  --   97* 101 102 99 98  CO2  --  '24 25 25 28 27  '$ GLUCOSE  --  111* 110* 99 98 111*  BUN  --  '15 15 17 18 '$ 24*  CREATININE  --  0.90 0.87 0.86 0.88 1.00  CALCIUM  --  8.3* 8.1* 8.0* 8.4* 8.4*  MG 1.7  --  1.6*  --   --   --     GFR Estimated Creatinine Clearance: 67 mL/min (by C-G formula based on SCr of 1 mg/dL). Liver Function Tests: Recent Labs  Lab 12/15/21 1419  AST 26  ALT 15  ALKPHOS 145*  BILITOT 1.5*  PROT 6.3*  ALBUMIN 2.5*    No results for input(s): "LIPASE", "AMYLASE" in the last 168 hours. Recent Labs  Lab 12/15/21 1745  AMMONIA 31    Coagulation profile No results for input(s): "INR", "PROTIME" in the last 168 hours. COVID-19 Labs  No results for input(s): "DDIMER", "FERRITIN", "LDH", "CRP" in the last 72 hours.  Lab Results  Component Value Date   SARSCOV2NAA NEGATIVE 10/12/2019   [place] NEGATIVE 10/04/2019   SARSCOV2NAA NOT DETECTED 12/20/2018   [place] NEGATIVE 08/10/2018    CBC: Recent Labs  Lab 12/15/21 1419 12/16/21 0330 12/17/21 1052 12/18/21 0522  WBC 29.0* 29.2* 31.0* 37.3*  NEUTROABS 25.7*  --  26.7* 32.9*  HGB 9.9* 9.7* 10.4* 10.8*  HCT 30.7* 29.3* 30.5* 32.2*  MCV 120.4* 119.6* 116.9* 119.3*  PLT 306 283 293 334    Cardiac Enzymes: Recent Labs  Lab 12/15/21 1419  CKTOTAL 64    BNP (last 3 results) No results for input(s): "PROBNP" in the last 8760 hours. CBG: No results for input(s): "GLUCAP" in the last 168 hours. D-Dimer: No results for input(s): "DDIMER" in the last 72 hours. Hgb A1c: No results for input(s): "HGBA1C" in the last 72 hours. Lipid Profile: No results for input(s): "CHOL", "HDL", "LDLCALC", "TRIG", "CHOLHDL", "LDLDIRECT" in the last 72 hours. Thyroid function studies: No results for input(s): "TSH", "T4TOTAL", "T3FREE", "THYROIDAB" in the last 72 hours.  Invalid input(s): "FREET3" Anemia work up: Recent Labs    12/18/21 [place] 1,183*  FOLATE 9.2    Sepsis Labs: Recent Labs   Lab 12/15/21 1419 12/15/21 1730 12/15/21 1950 12/16/21 0330 12/17/21 1052 12/18/21 0522  WBC 29.0*  --   --  29.2* 31.0* 37.3*  LATICACIDVEN  --  1.3 1.1  --   --   --  Microbiology Recent Results (from the past 240 hour(s))  Blood culture (routine x 2)     Status: None   Collection Time: 12/15/21  5:25 PM   Specimen: BLOOD  Result Value Ref Range Status   Specimen Description BLOOD LEFT ANTECUBITAL  Final   Special Requests   Final    BOTTLES DRAWN AEROBIC AND ANAEROBIC Blood Culture results may not be optimal due to an excessive volume of blood received in culture bottles   Culture   Final    NO GROWTH 5 DAYS Performed at [facility], [place] [address], [place], [place] [address]    Report Status 12/20/2021 FINAL  Final  Blood culture (routine x 2)     Status: None   Collection Time: 12/15/21  5:30 PM   Specimen: BLOOD  Result Value Ref Range Status   Specimen Description BLOOD RIGHT ANTECUBITAL  Final   Special Requests   Final    BOTTLES DRAWN AEROBIC AND ANAEROBIC Blood Culture adequate volume   Culture   Final    NO GROWTH 5 DAYS Performed at [facility], [place] [address], [place], [place] [address]    Report Status 12/20/2021 FINAL  Final  Urine Culture     Status: Abnormal   Collection Time: 12/15/21  7:50 PM   Specimen: Urine, Clean Catch  Result Value Ref Range Status   Specimen Description URINE, CLEAN CATCH  Final   Special Requests   Final    NONE Performed at [facility], [place] [address] [place], [place] [address]    Culture >=100,000 COLONIES/mL PROTEUS MIRABILIS (A)  Final   Report Status 12/18/2021 FINAL  Final   Organism ID, Bacteria PROTEUS MIRABILIS (A)  Final      Susceptibility   Proteus mirabilis - MIC*    AMPICILLIN <=2 SENSITIVE Sensitive     CEFAZOLIN <=4 SENSITIVE Sensitive     CEFEPIME <=0.12 SENSITIVE Sensitive     CEFTRIAXONE <=0.25 SENSITIVE Sensitive     CIPROFLOXACIN <=0.25 SENSITIVE Sensitive     GENTAMICIN  <=1 SENSITIVE Sensitive     IMIPENEM 2 SENSITIVE Sensitive     NITROFURANTOIN 128 RESISTANT Resistant     TRIMETH/SULFA <=20 SENSITIVE Sensitive     AMPICILLIN/SULBACTAM <=2 SENSITIVE Sensitive     PIP/TAZO <=4 SENSITIVE Sensitive     * >=100,000 COLONIES/mL PROTEUS MIRABILIS  MRSA culture     Status: None   Collection Time: 12/17/21 11:41 AM   Specimen: Nasal; Body Fluid  Result Value Ref Range Status   Specimen Description NASAL SWAB  Final   Special Requests   Final    NASAL SWAB Performed at [facility], [place] [address] [place], [place] [address]    Culture FEW STAPHYLOCOCCUS AUREUS  Final   Report Status 12/19/2021 FINAL  Final   Organism ID, Bacteria STAPHYLOCOCCUS AUREUS  Final      Susceptibility   Staphylococcus aureus - MIC*    CIPROFLOXACIN <=0.5 SENSITIVE Sensitive     ERYTHROMYCIN <=0.25 SENSITIVE Sensitive     GENTAMICIN <=0.5 SENSITIVE Sensitive     OXACILLIN <=0.25 SENSITIVE Sensitive     TETRACYCLINE <=1 SENSITIVE Sensitive     VANCOMYCIN <=0.5 SENSITIVE Sensitive     TRIMETH/SULFA <=10 SENSITIVE Sensitive     CLINDAMYCIN <=0.25 SENSITIVE Sensitive     RIFAMPIN <=0.5 SENSITIVE Sensitive     Inducible Clindamycin NEGATIVE Sensitive     * FEW STAPHYLOCOCCUS AUREUS  Culture, body fluid w Gram  Stain-bottle     Status: None (Preliminary result)   Collection Time: 12/18/21 10:01 AM   Specimen: Pleura  Result Value Ref Range Status   Specimen Description PLEURAL  Final   Special Requests NONE  Final   Culture   Final    NO GROWTH 2 DAYS Performed at [facility], [address], [place], [place] [address]    Report Status PENDING  Incomplete  Gram stain     Status: None   Collection Time: 12/18/21 10:01 AM   Specimen: Pleura  Result Value Ref Range Status   Specimen Description PLEURAL  Final   Special Requests NONE  Final   Gram Stain   Final    NO WBC SEEN NO ORGANISMS SEEN Performed at [facility], [address], [place],  [place] [address]    Report Status 12/18/2021 FINAL  Final     Medications:    (feeding supplement) PROSource Plus  30 mL Oral BID BM   apixaban  5 mg Oral BID   azithromycin  500 mg Oral Daily   cephALEXin  500 mg Oral Q8H   Chlorhexidine Gluconate Cloth  6 each Topical Daily   cholecalciferol  5,000 Units Oral Daily   ferrous sulfate  325 mg Oral Q breakfast   furosemide  40 mg Intravenous Q12H   hydroxyurea  500 mg Oral BID   leptospermum manuka honey  1 Application Topical Daily   melatonin  3 mg Oral QHS   metoprolol tartrate  25 mg Oral BID   multivitamin with minerals  1 tablet Oral Daily   pantoprazole  40 mg Oral BID   potassium chloride  20 mEq Oral BID   rosuvastatin  20 mg Oral Daily   sucralfate  1 g Oral BID   tamsulosin  0.4 mg Oral QPC supper   Continuous Infusions:  sodium chloride Stopped (12/19/21 1340)      LOS: 5 days   Charlynne Cousins  [facility]  12/21/2021, 7:36 AM

## 2021-12-22 DIAGNOSIS — J9601 Acute respiratory failure with hypoxia: Secondary | ICD-10-CM | POA: Diagnosis not present

## 2021-12-22 LAB — BASIC METABOLIC PANEL
Anion gap: 10 (ref 5–15)
BUN: 31 mg/dL — ABNORMAL HIGH (ref 8–23)
CO2: 24 mmol/L (ref 22–32)
Calcium: 7.9 mg/dL — ABNORMAL LOW (ref 8.9–10.3)
Chloride: 96 mmol/L — ABNORMAL LOW (ref 98–111)
Creatinine, Ser: 1.07 mg/dL (ref 0.61–1.24)
GFR, Estimated: >60 mL/min (ref >60)
Glucose, Bld: 98 mg/dL (ref 70–99)
Potassium: 3.8 mmol/L (ref 3.5–5.1)
Sodium: 130 mmol/L — ABNORMAL LOW (ref 135–145)

## 2021-12-22 LAB — CYTOLOGY - NON PAP

## 2021-12-22 NOTE — Progress Notes (Addendum)
Occupational Therapy Treatment Patient Details Name: Justin [name] MRN: [id number] DOB: 1955-01-28 Today's Date: 12/22/2021   History of present illness Pt is a 66 y/o male who admitted for acute hypoxemic respiratory failure and acute metabolic encephalopathy in setting of UTI. PMH: a fib, DM2, HTN, CHF, recent L hip sx.   OT comments  Pt with gradual progression towards OT goals with focus on progression of OOB tasks and transfers. Pt received sitting EOB after nursing assist for bed level peri care. Faciliated standing trials with Stedy and overall Min A x1. Pt endorses "a little" dizziness though BP stable pre and post activity with TED hose donned. Based on success with Stedy, encouraged further standing with RW after transfer though pt politely declined at this time. Educated re: sitting up in chair for at least 1 hour for pulmonary health though expressed caution w/ extended OOB sitting due to wound on bottom. Will continue to follow acutely.    Recommendations for follow up therapy are one component of a multi-disciplinary discharge planning process, led by the attending physician.  Recommendations may be updated based on patient status, additional functional criteria and insurance authorization.    Follow Up Recommendations  Skilled nursing-short term rehab (<3 hours/day)     Assistance Recommended at Discharge Frequent or constant Supervision/Assistance  Patient can return home with the following  A lot of help with walking and/or transfers;A lot of help with bathing/dressing/bathroom   Equipment Recommendations  Other (comment) (TBD pending progress)    Recommendations for Other Services      Precautions / Restrictions Precautions Precautions: Fall;Other (comment) Precaution Comments: Wounds on L heel and sacral area, monitor orthostatics, O2 Restrictions Weight Bearing Restrictions: Yes LLE Weight Bearing: Weight bearing as tolerated       Mobility Bed Mobility                General bed mobility comments: EOB on entry with RN    Transfers Overall transfer level: Needs assistance Equipment used: Ambulation equipment used Transfers: Sit to/from Stand, Bed to chair/wheelchair/BSC Sit to Stand: Min assist, From elevated surface           General transfer comment: able to stand into Stedy easily with slightly elevated bed, use of this to transfer to recliner without +2 assist. educated that this could be used with nursing staff for [facility] transfers as well Transfer via Lift Equipment: Stedy   Balance Overall balance assessment: Needs assistance Sitting-balance support: No upper extremity supported, Feet supported Sitting balance-Leahy Scale: Good     Standing balance support: Bilateral upper extremity supported, During functional activity Standing balance-Leahy Scale: Poor                             ADL either performed or assessed with clinical judgement   ADL Overall ADL's : Needs assistance/impaired Eating/Feeding: Independent;Sitting               Upper Body Dressing : Set up;Sitting Upper Body Dressing Details (indicate cue type and reason): donning gown around back                   General ADL Comments: Focus on OOB progression, assessment of TED hose impact on BP. education on upright sitting (with consideration of avoiding prolonged sitting due to sacral wound - pillow placed in seat). pt able to demo ability to read menu, identify breakfast food items wanted w/ OT assist to call caferteria  Extremity/Trunk Assessment Upper Extremity Assessment Upper Extremity Assessment: Generalized weakness   Lower Extremity Assessment Lower Extremity Assessment: Defer to PT evaluation        Vision   Vision Assessment?: No apparent visual deficits   Perception     Praxis      Cognition Arousal/Alertness: Awake/alert Behavior During Therapy: WFL for tasks assessed/performed Overall Cognitive  Status: Impaired/Different from baseline Area of Impairment: Orientation, Attention, Safety/judgement, Problem solving, Awareness                 Orientation Level: Disoriented to, Situation Current Attention Level: Selective     Safety/Judgement: Decreased awareness of deficits Awareness: Emergent Problem Solving: Requires verbal cues, Slow processing General Comments: pleasant, follows commands consistently. noted minor confusion with difficulty forming full thoughts at times ("wish everybody could communicate" when asked his concerns, he asks what they were going to do about his herniated discs though pt reports he has had this issue for 21 years)        Exercises      Shoulder Instructions       General Comments BP stable pre and post activity (106/67). SpO2 96% on 2 L O2 post activtiy    Pertinent Vitals/ Pain       Pain Assessment Pain Assessment: Faces Faces Pain Scale: Hurts a little bit Pain Location: chronic low back pain Pain Descriptors / Indicators: Grimacing, Guarding Pain Intervention(s): Monitored during session, Limited activity within patient's tolerance, Repositioned  Home Living                                          Prior Functioning/Environment              Frequency  Min 2X/week        Progress Toward Goals  OT Goals(current goals can now be found in the care plan section)  Progress towards OT goals: Progressing toward goals  Acute Rehab OT Goals Patient Stated Goal: find out what they are going to do about my back OT Goal Formulation: With patient Time For Goal Achievement: 12/30/21 Potential to Achieve Goals: Good ADL Goals Pt Will Perform Lower Body Bathing: with modified independence;sit to/from stand;with adaptive equipment Pt Will Perform Lower Body Dressing: with modified independence;with adaptive equipment;sit to/from stand Pt Will Transfer to Toilet: with supervision;ambulating Pt Will Perform  Toileting - Clothing Manipulation and hygiene: with modified independence;sit to/from stand;sitting/lateral leans  Plan Discharge plan remains appropriate    Co-evaluation                 AM-PAC OT "6 Clicks" Daily Activity     Outcome Measure   Help from another person eating meals?: None Help from another person taking care of personal grooming?: A Little Help from another person toileting, which includes using toliet, bedpan, or urinal?: A Lot Help from another person bathing (including washing, rinsing, drying)?: A Lot Help from another person to put on and taking off regular upper body clothing?: A Little Help from another person to put on and taking off regular lower body clothing?: A Lot 6 Click Score: 16    End of Session Equipment Utilized During Treatment: Gait belt  OT Visit Diagnosis: Other abnormalities of gait and mobility (R26.89);Muscle weakness (generalized) (M62.81)   Activity Tolerance Patient tolerated treatment well   Patient Left in chair;with call bell/phone within reach;with chair alarm set   Nurse  Communication Mobility status        Time: 5956-3875 OT Time Calculation (min): 28 min  Charges: OT General Charges $OT Visit: 1 Visit OT Treatments $Therapeutic Activity: 23-37 mins  Malachy Chamber, OTR/L Acute Rehab Services Office: [phone]   Layla Maw 12/22/2021, 8:20 AM

## 2021-12-22 NOTE — Progress Notes (Signed)
Physical Therapy Treatment Patient Details Name: Arrick Dutton MRN: [id number] DOB: 1955-09-08 Today's Date: 12/22/2021   History of Present Illness Pt is a 66 y/o male who admitted for acute hypoxemic respiratory failure and acute metabolic encephalopathy in setting of UTI. PMH: a fib, DM2, HTN, CHF, recent L hip sx.    PT Comments    Pt resting in chair at start of session and appeared to be falling asleep and leaning forward. Pt easily alerted and denied dizziness, BP 113/70 mmHg and pt denied symptoms throughout session. Min assist for initial stand from recliner and pt returned to sit due to urge for BM, noted on chair pad that pt had already begun. Mod +2 assist provided to guide steps and walker position to move from chair to [facility]. Pt required max/total assist for pericare and sacral wound covered in stool requiring cleansing. Pt took small steps forward from [facility] with RW and mod assist to steady and guide turn to sit EOB. Rolling completed, pt required min assist to guide hand to rails to initiate turns. RN reapplied sacral cover after cleansing and pt repositioned for comfort. Continue to recommend SNF rehab, will progress as able.   Recommendations for follow up therapy are one component of a multi-disciplinary discharge planning process, led by the attending physician.  Recommendations may be updated based on patient status, additional functional criteria and insurance authorization.  Follow Up Recommendations  Skilled nursing-short term rehab (<3 hours/day) Can patient physically be transported by private vehicle: Yes   Assistance Recommended at Discharge Frequent or constant Supervision/Assistance  Patient can return home with the following A lot of help with walking and/or transfers;A lot of help with bathing/dressing/bathroom   Equipment Recommendations  None recommended by PT    Recommendations for Other Services       Precautions / Restrictions  Precautions Precautions: Fall;Other (comment) Precaution Comments: Wounds on L heel and sacral area, monitor orthostatics, O2 Restrictions Weight Bearing Restrictions: No LUE Weight Bearing: Weight bearing as tolerated LLE Weight Bearing: Weight bearing as tolerated     Mobility  Bed Mobility Overal bed mobility: Needs Assistance Bed Mobility: Sit to Supine, Rolling Rolling: Min assist     Sit to supine: Min assist, HOB elevated   General bed mobility comments: pt in recliner at start, leaning forward and drifting asleep. Pt alerted and able to sit back in chair. EOS min assist required to raise LE's up onto bed and return to supine. Min assist to guide reaching for bed rail to roll Rt/Lt.    Transfers Overall transfer level: Needs assistance Equipment used: Rolling walker (2 wheels) Transfers: Sit to/from Stand, Bed to chair/wheelchair/BSC Sit to Stand: Min assist, From elevated surface   Step pivot transfers: Mod assist, +2 safety/equipment       General transfer comment: Min assist to stand from recliner on 1st time then pt returned to sitting due to need for BM. Patient had already started in chair and Mod assist on second stand and to guide steps to turn to [facility]. Mod +2 for safety with stand from commode.    Ambulation/Gait Ambulation/Gait assistance: Mod assist Gait Distance (Feet): 4 Feet Assistive device: Rolling walker (2 wheels) Gait Pattern/deviations: Step-through pattern, Decreased stride length, Trunk flexed Gait velocity: decr     General Gait Details: short steps forward from [facility] and Mod assist to steady and guide turn (180* turn) to sit at EOB. trunk flexed throughout gait.   Stairs  Wheelchair Mobility    Modified Rankin (Stroke Patients Only)       Balance Overall balance assessment: Needs assistance Sitting-balance support: No upper extremity supported, Feet supported Sitting balance-Leahy Scale: Good     Standing balance  support: Bilateral upper extremity supported, During functional activity Standing balance-Leahy Scale: Poor                              Cognition Arousal/Alertness: Awake/alert Behavior During Therapy: WFL for tasks assessed/performed Overall Cognitive Status: Impaired/Different from baseline Area of Impairment: Orientation, Attention, Safety/judgement, Problem solving, Awareness                 Orientation Level: Disoriented to, Situation Current Attention Level: Selective     Safety/Judgement: Decreased awareness of deficits Awareness: Emergent Problem Solving: Requires verbal cues, Slow processing General Comments: pleasant, follows commands consistently. noted minor confusion with difficulty forming full thoughts at times ("wish everybody could communicate" when asked his concerns, he asks what they were going to do about his herniated discs though pt reports he has had this issue for 21 years)        Exercises      General Comments General comments (skin integrity, edema, etc.): BP 113/70 mmHg at start of session, no symptoms throughout      Pertinent Vitals/Pain Pain Assessment Pain Assessment: Faces Faces Pain Scale: Hurts even more Pain Location: with pericare from RN due to DM under sacral cover Pain Descriptors / Indicators: Grimacing, Guarding Pain Intervention(s): Limited activity within patient's tolerance, Monitored during session, Repositioned, Heat applied (warm blankets)    Home Living                          Prior Function            PT Goals (current goals can now be found in the care plan section) Acute Rehab PT Goals PT Goal Formulation: With patient Time For Goal Achievement: 12/30/21 Potential to Achieve Goals: Good Progress towards PT goals: Progressing toward goals    Frequency    Min 2X/week      PT Plan Current plan remains appropriate    Co-evaluation              AM-PAC PT "6 Clicks"  Mobility   Outcome Measure  Help needed turning from your back to your side while in a flat bed without using bedrails?: A Little Help needed moving from lying on your back to sitting on the side of a flat bed without using bedrails?: A Little Help needed moving to and from a bed to a chair (including a wheelchair)?: A Lot Help needed standing up from a chair using your arms (e.g., wheelchair or bedside chair)?: A Lot Help needed to walk in hospital room?: A Lot Help needed climbing 3-5 steps with a railing? : Total 6 Click Score: 13    End of Session Equipment Utilized During Treatment: Oxygen;Gait belt Activity Tolerance: Patient tolerated treatment well Patient left: in bed;with call bell/phone within reach;with bed alarm set Nurse Communication: Mobility status PT Visit Diagnosis: Other abnormalities of gait and mobility (R26.89);Muscle weakness (generalized) (M62.81)     Time: 7628-3151 PT Time Calculation (min) (ACUTE ONLY): 47 min  Charges:  $Therapeutic Activity: 38-52 mins                     Gwynneth Albright PT, DPT Acute Rehabilitation Services  Office [phone]  12/22/21 2:44 PM

## 2021-12-22 NOTE — TOC Progression Note (Addendum)
Transition of Care [facility]) - Progression Note    Patient Details  Name: Justin [name] MRN: [id number] Date of Birth: [birth year]  Transition of Care [facility]) CM/SW Theresa, [place] Phone Number: 12/22/2021, 2:02 PM  Clinical Narrative:     Update- 3:27pm- Patients insurance authorization has been approved [facility] ID# [id number]. Insurance authorization has been approved from 12/5-12/7. Patient has SNF bed at [facility]. CSW will continue to follow.   CSW received call back from patients spouse Justin [name]. CSW provided patients spouse with patients SNF bed offers. Patients spouse chose SNF bed for patient at [facility] Christine with intake confirmed SNF bed for patient. CSW will start insurance authorization for patient.   CSW LVM with patients spouse Justin [name]. CSW Awaiting call back to discuss SNF bed offers. CSW will continue to follow and assist with patients dc planning needs.  Expected Discharge Plan: [place] Barriers to Discharge: Continued Medical Work up  Expected Discharge Plan and Services Expected Discharge Plan: [place] In-house Referral: Clinical Social Work     Living arrangements for the past 2 months: Single Family Home                                       Social Determinants of Health (SDOH) Interventions    Readmission Risk Interventions    11/06/2021    9:35 AM  Readmission Risk Prevention Plan  Transportation Screening Complete  PCP or Specialist Appt within 3-5 Days Complete  HRI or [place] Complete  Social Work Consult for [place] Planning/Counseling Complete  Palliative Care Screening Not Applicable  Medication Review Press photographer) Complete

## 2021-12-22 NOTE — Progress Notes (Signed)
[facility] PROGRESS NOTE    Progress Note  Justin [name]  OZY:[id number] DOB: [birth year] DOA: 12/15/2021 PCP: [name], MD     Brief Narrative:   Justin [name] is an 66 y.o. male past medical history of essential thrombocythemia, chronic atrial fibrillation on Eliquis, systolic heart failure with an EF of 45% dural venous thrombosis, aortic stenosis with a history of aortic valve replacement, CAD status post CABG, essential hypertension, diabetes mellitus type 2 chronic systolic heart failure BPH with a left total hip replacement on November 04, 2021 comes in to the ED for generalized weakness confusion for 1 week, he was also noted to have a pressure ulcer on the left buttock and heel, white count of 29,000 CT of the head showed no acute findings.  Assessment/Plan:   Acute respiratory failure with hypoxia: Continue oral Keflex for possible UTI urine culture grew Proteus. Transition Lasix to oral. Monitor strict I's and O's and daily weights Physical therapy evaluated the patient, will need skilled nursing facility Medically stable for transfer to skilled nursing facility.  Leukocytosis possibly due to leukemia he relates he is being followed up by hematologist at [facility] as an outpatient: He relates he has a history of leukemia which is being followed up at [facility].  There is no record of this in the chart or care everywhere he continues to improve clinically. CT chest showed possible pneumonia 6 mm nodules 3-4.  Will need to be followed up as an outpatient CT scan of the abdomen pelvis showed no lymphadenopathy showed mild ascites and splenomegaly.  UTI due to indwelling foley cath:  UC grew proteus mirabalis. Continue Flomax, continue Keflex  Acute metabolic encephalopathy: Of unclear etiology, possibly due to hypoxia now resolved.  Acute on chronic systolic heart failure: Blood pressure is borderline low hold IV diuretics transition him to oral  Lasix. Continue metoprolol  Chronic atrial fibrillation: Continue metoprolol. Resume Eliquis.  Cranial sinus thrombosis: CT of the head unchanged. Resume Eliquis  Essential thrombocythemia: Continue Hydrea  Macrocytic anemia: Folate and B12 unremarkable follow-up with hematology as an outpatient  Severe protein malnutrition: Ensure TID.  Stage II pressure decubitus ulcer and left heel: Continue wound care. RN Pressure Injury Documentation: Pressure Injury 11/17/21 Heel Left Unstageable - Full thickness tissue loss in which the base of the injury is covered by slough (yellow, tan, gray, green or brown) and/or eschar (tan, brown or black) in the wound bed. pressure injury (Active)  11/17/21 0612  Location: Heel  Location Orientation: Left  Staging: Unstageable - Full thickness tissue loss in which the base of the injury is covered by slough (yellow, tan, gray, green or brown) and/or eschar (tan, brown or black) in the wound bed.  Wound Description (Comments): pressure injury  Present on Admission: Yes  Dressing Type Honey;Foam - Lift dressing to assess site every shift 12/21/21 0800     Pressure Injury 11/19/21 Coccyx Mid;Lower Stage 2 -  Partial thickness loss of dermis presenting as a shallow open injury with a red, pink wound bed without slough. (Active)  11/19/21 1625  Location: Coccyx  Location Orientation: Mid;Lower  Staging: Stage 2 -  Partial thickness loss of dermis presenting as a shallow open injury with a red, pink wound bed without slough.  Wound Description (Comments):   Present on Admission: Yes  Dressing Type Foam - Lift dressing to assess site every shift 12/21/21 0800     Pressure Injury 11/19/21 Sacrum Left Stage 2 -  Partial thickness  loss of dermis presenting as a shallow open injury with a red, pink wound bed without slough. (Active)  11/19/21 1625  Location: Sacrum  Location Orientation: Left  Staging: Stage 2 -  Partial thickness loss of dermis  presenting as a shallow open injury with a red, pink wound bed without slough.  Wound Description (Comments):   Present on Admission: Yes  Dressing Type Foam - Lift dressing to assess site every shift 12/21/21 0800     Pressure Injury 11/19/21 Sacrum Right Stage 2 -  Partial thickness loss of dermis presenting as a shallow open injury with a red, pink wound bed without slough. (Active)  11/19/21 1625  Location: Sacrum  Location Orientation: Right  Staging: Stage 2 -  Partial thickness loss of dermis presenting as a shallow open injury with a red, pink wound bed without slough.  Wound Description (Comments):   Present on Admission: Yes  Dressing Type Foam - Lift dressing to assess site every shift 12/20/21 2144    DVT prophylaxis: Eliquis Family Communication: None Status is: Inpatient Remains inpatient appropriate because: Acute respiratory failure with hypoxia of unclear etiology    Code Status:     Code Status Orders  (From admission, onward)           Start     Ordered   12/16/21 0046  Do not attempt resuscitation (DNR)  Continuous       Question Answer Comment  In the event of cardiac or respiratory ARREST Do not call a "code blue"   In the event of cardiac or respiratory ARREST Do not perform Intubation, CPR, defibrillation or ACLS   In the event of cardiac or respiratory ARREST Use medication by any route, position, wound care, and other measures to relive pain and suffering. May use oxygen, suction and manual treatment of airway obstruction as needed for comfort.      12/16/21 0049           Code Status History     Date Active Date Inactive Code Status Order ID Comments User Context   11/19/2021 1532 11/30/2021 1527 Full Code [id number]  Elizabeth Sauer Inpatient   11/19/2021 1532 11/19/2021 1532 DNR [id number]  Cathlyn Parsons, PA-C Inpatient   11/11/2021 1629 11/19/2021 1509 DNR [id number]  Vernelle Emerald, MD Inpatient   11/04/2021 1707 11/11/2021  1629 Full Code [id number]  Irving Copas, PA-C Inpatient   08/18/2021 2015 08/22/2021 1938 Full Code [id number]  Kayleen Memos, DO ED   10/04/2019 2258 10/12/2019 2204 Full Code [id number]  Stark Klein, MD ED   12/23/2018 1823 12/24/2018 2142 Full Code [id number]  Norman Herrlich Inpatient   08/10/2018 0059 08/13/2018 2201 Full Code [id number]  Etta Quill, DO ED   01/03/2018 1913 01/10/2018 1713 Full Code [id number]  Nani Skillern, PA-C Inpatient   12/24/2017 1101 12/24/2017 1552 Full Code [id number]  Bensimhon, Shaune Pascal, MD Inpatient         IV Access:   Peripheral IV   Procedures and diagnostic studies:   No results found.   Medical Consultants:   None.   Subjective:    Justin [name] feels better than he.  Objective:    Vitals:   12/21/21 0841 12/21/21 1617 12/21/21 2016 12/22/21 0755  BP: 108/75 108/79 108/73 109/70  Pulse: 82 72 90 99  Resp: '16 17  16  '$ Temp: (!) 97.5 F (36.4 C) (!) 97.3 F (36.3 C) 97.7 F (36.5 C) 97.7 F (36.5  C)  TempSrc:  Oral Axillary Oral  SpO2: 98%  90% 100%  Weight:      Height:       SpO2: 100 % O2 Flow Rate (L/min): 2 L/min   Intake/Output Summary (Last 24 hours) at 12/22/2021 0818 Last data filed at 12/21/2021 1112 Gross per 24 hour  Intake 0 ml  Output --  Net 0 ml    Filed Weights   12/16/21 1753  Weight: 73.6 kg    Exam: General exam: In no acute distress. Respiratory system: Good air movement and clear to auscultation. Cardiovascular system: S1 & S2 heard, RRR. No JVD. Gastrointestinal system: Abdomen is nondistended, soft and nontender.  Extremities: No pedal edema. Skin: No rashes, lesions or ulcers Psychiatry: Judgement and insight appear normal. Mood & affect appropriate.  Data Reviewed:    Labs: Basic Metabolic Panel: Recent Labs  Lab 12/16/21 0330 12/18/21 0522 12/18/21 9485 12/19/21 0337 12/20/21 0154 12/21/21 0131 12/22/21 0538  NA  --    < > 136 132* 136 135 130*  K   --    < > 3.4* 4.3 4.1 3.7 3.8  CL  --    < > 101 102 99 98 96*  CO2  --    < > '25 25 28 27 24  '$ GLUCOSE  --    < > 110* 99 98 111* 98  BUN  --    < > '15 17 18 '$ 24* 31*  CREATININE  --    < > 0.87 0.86 0.88 1.00 1.07  CALCIUM  --    < > 8.1* 8.0* 8.4* 8.4* 7.9*  MG 1.7  --  1.6*  --   --   --   --    < > = values in this interval not displayed.    GFR Estimated Creatinine Clearance: 62.6 mL/min (by C-G formula based on SCr of 1.07 mg/dL). Liver Function Tests: Recent Labs  Lab 12/15/21 1419  AST 26  ALT 15  ALKPHOS 145*  BILITOT 1.5*  PROT 6.3*  ALBUMIN 2.5*    No results for input(s): "LIPASE", "AMYLASE" in the last 168 hours. Recent Labs  Lab 12/15/21 1745  AMMONIA 31    Coagulation profile No results for input(s): "INR", "PROTIME" in the last 168 hours. COVID-19 Labs  No results for input(s): "DDIMER", "FERRITIN", "LDH", "CRP" in the last 72 hours.  Lab Results  Component Value Date   SARSCOV2NAA NEGATIVE 10/12/2019   [place] NEGATIVE 10/04/2019   SARSCOV2NAA NOT DETECTED 12/20/2018   [place] NEGATIVE 08/10/2018    CBC: Recent Labs  Lab 12/15/21 1419 12/16/21 0330 12/17/21 1052 12/18/21 0522  WBC 29.0* 29.2* 31.0* 37.3*  NEUTROABS 25.7*  --  26.7* 32.9*  HGB 9.9* 9.7* 10.4* 10.8*  HCT 30.7* 29.3* 30.5* 32.2*  MCV 120.4* 119.6* 116.9* 119.3*  PLT 306 283 293 334    Cardiac Enzymes: Recent Labs  Lab 12/15/21 1419  CKTOTAL 64    BNP (last 3 results) No results for input(s): "PROBNP" in the last 8760 hours. CBG: No results for input(s): "GLUCAP" in the last 168 hours. D-Dimer: No results for input(s): "DDIMER" in the last 72 hours. Hgb A1c: No results for input(s): "HGBA1C" in the last 72 hours. Lipid Profile: No results for input(s): "CHOL", "HDL", "LDLCALC", "TRIG", "CHOLHDL", "LDLDIRECT" in the last 72 hours. Thyroid function studies: No results for input(s): "TSH", "T4TOTAL", "T3FREE", "THYROIDAB" in the last 72 hours.  Invalid  input(s): "FREET3" Anemia work up: No results for input(s): "  VITAMINB12", "FOLATE", "FERRITIN", "TIBC", "IRON", "RETICCTPCT" in the last 72 hours.  Sepsis Labs: Recent Labs  Lab 12/15/21 1419 12/15/21 1730 12/15/21 1950 12/16/21 0330 12/17/21 1052 12/18/21 0522  WBC 29.0*  --   --  29.2* 31.0* 37.3*  LATICACIDVEN  --  1.3 1.1  --   --   --     Microbiology Recent Results (from the past 240 hour(s))  Blood culture (routine x 2)     Status: None   Collection Time: 12/15/21  5:25 PM   Specimen: BLOOD  Result Value Ref Range Status   Specimen Description BLOOD LEFT ANTECUBITAL  Final   Special Requests   Final    BOTTLES DRAWN AEROBIC AND ANAEROBIC Blood Culture results may not be optimal due to an excessive volume of blood received in culture bottles   Culture   Final    NO GROWTH 5 DAYS Performed at [facility], [place] [address] [place], [place] [address]    Report Status 12/20/2021 FINAL  Final  Blood culture (routine x 2)     Status: None   Collection Time: 12/15/21  5:30 PM   Specimen: BLOOD  Result Value Ref Range Status   Specimen Description BLOOD RIGHT ANTECUBITAL  Final   Special Requests   Final    BOTTLES DRAWN AEROBIC AND ANAEROBIC Blood Culture adequate volume   Culture   Final    NO GROWTH 5 DAYS Performed at [facility], [place] [address], [place], [place] [address]    Report Status 12/20/2021 FINAL  Final  Urine Culture     Status: Abnormal   Collection Time: 12/15/21  7:50 PM   Specimen: Urine, Clean Catch  Result Value Ref Range Status   Specimen Description URINE, CLEAN CATCH  Final   Special Requests   Final    NONE Performed at [facility], [place] [address], [place], [place] [address]    Culture >=100,000 COLONIES/mL PROTEUS MIRABILIS (A)  Final   Report Status 12/18/2021 FINAL  Final   Organism ID, Bacteria PROTEUS MIRABILIS (A)  Final      Susceptibility   Proteus mirabilis - MIC*    AMPICILLIN <=2 SENSITIVE Sensitive      CEFAZOLIN <=4 SENSITIVE Sensitive     CEFEPIME <=0.12 SENSITIVE Sensitive     CEFTRIAXONE <=0.25 SENSITIVE Sensitive     CIPROFLOXACIN <=0.25 SENSITIVE Sensitive     GENTAMICIN <=1 SENSITIVE Sensitive     IMIPENEM 2 SENSITIVE Sensitive     NITROFURANTOIN 128 RESISTANT Resistant     TRIMETH/SULFA <=20 SENSITIVE Sensitive     AMPICILLIN/SULBACTAM <=2 SENSITIVE Sensitive     PIP/TAZO <=4 SENSITIVE Sensitive     * >=100,000 COLONIES/mL PROTEUS MIRABILIS  MRSA culture     Status: None   Collection Time: 12/17/21 11:41 AM   Specimen: Nasal; Body Fluid  Result Value Ref Range Status   Specimen Description NASAL SWAB  Final   Special Requests   Final    NASAL SWAB Performed at [facility], [place] [address] [place], [place] [address]    Culture FEW STAPHYLOCOCCUS AUREUS  Final   Report Status 12/19/2021 FINAL  Final   Organism ID, Bacteria STAPHYLOCOCCUS AUREUS  Final      Susceptibility   Staphylococcus aureus - MIC*    CIPROFLOXACIN <=0.5 SENSITIVE Sensitive     ERYTHROMYCIN <=0.25 SENSITIVE Sensitive     GENTAMICIN <=0.5 SENSITIVE Sensitive     OXACILLIN <=0.25 SENSITIVE Sensitive  TETRACYCLINE <=1 SENSITIVE Sensitive     VANCOMYCIN <=0.5 SENSITIVE Sensitive     TRIMETH/SULFA <=10 SENSITIVE Sensitive     CLINDAMYCIN <=0.25 SENSITIVE Sensitive     RIFAMPIN <=0.5 SENSITIVE Sensitive     Inducible Clindamycin NEGATIVE Sensitive     * FEW STAPHYLOCOCCUS AUREUS  Culture, body fluid w Gram Stain-bottle     Status: None (Preliminary result)   Collection Time: 12/18/21 10:01 AM   Specimen: Pleura  Result Value Ref Range Status   Specimen Description PLEURAL  Final   Special Requests NONE  Final   Culture   Final    NO GROWTH 3 DAYS Performed at [facility], [address] [place], [place] [address]    Report Status PENDING  Incomplete  Gram stain     Status: None   Collection Time: 12/18/21 10:01 AM   Specimen: Pleura  Result Value Ref Range Status   Specimen  Description PLEURAL  Final   Special Requests NONE  Final   Gram Stain   Final    NO WBC SEEN NO ORGANISMS SEEN Performed at [facility], [address] [place], [place] [address]    Report Status 12/18/2021 FINAL  Final     Medications:    (feeding supplement) PROSource Plus  30 mL Oral BID BM   apixaban  5 mg Oral BID   azithromycin  500 mg Oral Daily   cephALEXin  500 mg Oral Q8H   Chlorhexidine Gluconate Cloth  6 each Topical Daily   cholecalciferol  5,000 Units Oral Daily   ferrous sulfate  325 mg Oral Q breakfast   furosemide  20 mg Oral Daily   hydroxyurea  500 mg Oral BID   leptospermum manuka honey  1 Application Topical Daily   melatonin  3 mg Oral QHS   metoprolol tartrate  25 mg Oral BID   multivitamin with minerals  1 tablet Oral Daily   pantoprazole  40 mg Oral BID   potassium chloride  10 mEq Oral Daily   rosuvastatin  20 mg Oral Daily   sucralfate  1 g Oral BID   tamsulosin  0.4 mg Oral Daily   zolpidem  10 mg Oral QHS   Continuous Infusions:  sodium chloride Stopped (12/19/21 1340)      LOS: 6 days   Charlynne Cousins  [facility]  12/22/2021, 8:18 AM

## 2021-12-23 DIAGNOSIS — J9601 Acute respiratory failure with hypoxia: Secondary | ICD-10-CM | POA: Diagnosis not present

## 2021-12-23 LAB — CULTURE, BODY FLUID W GRAM STAIN -BOTTLE: Culture: NO GROWTH

## 2021-12-23 NOTE — TOC Progression Note (Signed)
Transition of Care (TOC) - Progression Note   Patient discussed in [place] meeting today with [place]  Patient Details  Name: Justin [name] MRN: [id number] Date of Birth: 01/01/1956  Transition of Care [facility]) CM/SW Contact  Avya Flavell, Edson Snowball, RN Phone Number: 12/23/2021, 3:58 PM  Clinical Narrative:       Expected Discharge Plan: [place] Barriers to Discharge: Continued Medical Work up  Expected Discharge Plan and Services Expected Discharge Plan: [place] In-house Referral: Clinical Social Work     Living arrangements for the past 2 months: Single Family Home                                       Social Determinants of Health (SDOH) Interventions    Readmission Risk Interventions    11/06/2021    9:35 AM  Readmission Risk Prevention Plan  Transportation Screening Complete  PCP or Specialist Appt within 3-5 Days Complete  HRI or [place] Complete  Social Work Consult for [place] Planning/Counseling Complete  Palliative Care Screening Not Applicable  Medication Review Press photographer) Complete

## 2021-12-23 NOTE — Progress Notes (Signed)
[facility] PROGRESS NOTE    Progress Note  Justin [name]  JKD:[id number] DOB: May 12, 1955 DOA: 12/15/2021 PCP: [name], MD     Brief Narrative:   Justin [name] is an 66 y.o. male past medical history of essential thrombocythemia, chronic atrial fibrillation on Eliquis, systolic heart failure with an EF of 45% dural venous thrombosis, aortic stenosis with a history of aortic valve replacement, CAD status post CABG, essential hypertension, diabetes mellitus type 2 chronic systolic heart failure BPH with a left total hip replacement on November 04, 2021 comes in to the ED for generalized weakness confusion for 1 week, he was also noted to have a pressure ulcer on the left buttock and heel, white count of 29,000 CT of the head showed no acute findings.  Assessment/Plan:   Acute respiratory failure with hypoxia: Urine culture grew Proteus completed course of antibiotics in house. Monitor strict I's and O's and daily weights. Hold Lasix becoming hyponatremic try to wean to room air. Physical therapy evaluated the patient, will need skilled nursing facility Medically stable for transfer to skilled nursing facility.  He is awaiting insurance authorization.  Leukocytosis possibly due to leukemia he relates he is being followed up by hematologist at [facility] as an outpatient: He relates he has a history of leukemia which is being followed up at [facility].  There is no record of this in the chart or care everywhere he continues to improve clinically. CT chest showed possible pneumonia 6 mm nodules 3-4.  Will need to be followed up as an outpatient CT scan of the abdomen pelvis showed no lymphadenopathy showed mild ascites and splenomegaly.  UTI due to indwelling foley cath: Complete his antibiotic treatment in house. Continue Flomax, continue Keflex  Acute metabolic encephalopathy: Of unclear etiology, possibly due to hypoxia now resolved.  Acute on chronic systolic  heart failure: Blood pressure is stable. Continue metoprolol  Chronic atrial fibrillation: Continue metoprolol. Resume Eliquis.  Cranial sinus thrombosis: CT of the head unchanged. Resume Eliquis  Essential thrombocythemia: Continue Hydrea  Macrocytic anemia: Folate and B12 unremarkable follow-up with hematology as an outpatient  Severe protein malnutrition: Ensure TID.  Stage II pressure decubitus ulcer and left heel: Continue wound care. RN Pressure Injury Documentation: Pressure Injury 11/17/21 Heel Left Unstageable - Full thickness tissue loss in which the base of the injury is covered by slough (yellow, tan, gray, green or brown) and/or eschar (tan, brown or black) in the wound bed. pressure injury (Active)  11/17/21 0612  Location: Heel  Location Orientation: Left  Staging: Unstageable - Full thickness tissue loss in which the base of the injury is covered by slough (yellow, tan, gray, green or brown) and/or eschar (tan, brown or black) in the wound bed.  Wound Description (Comments): pressure injury  Present on Admission: Yes  Dressing Type Honey;Foam - Lift dressing to assess site every shift 12/21/21 0800     Pressure Injury 11/19/21 Coccyx Mid;Lower Stage 2 -  Partial thickness loss of dermis presenting as a shallow open injury with a red, pink wound bed without slough. (Active)  11/19/21 1625  Location: Coccyx  Location Orientation: Mid;Lower  Staging: Stage 2 -  Partial thickness loss of dermis presenting as a shallow open injury with a red, pink wound bed without slough.  Wound Description (Comments):   Present on Admission: Yes  Dressing Type Foam - Lift dressing to assess site every shift 12/22/21 2000     Pressure Injury 11/19/21 Sacrum Left Stage 2 -  Partial thickness loss of dermis presenting as a shallow open injury with a red, pink wound bed without slough. (Active)  11/19/21 1625  Location: Sacrum  Location Orientation: Left  Staging: Stage 2 -   Partial thickness loss of dermis presenting as a shallow open injury with a red, pink wound bed without slough.  Wound Description (Comments):   Present on Admission: Yes  Dressing Type Foam - Lift dressing to assess site every shift 12/22/21 2000     Pressure Injury 11/19/21 Sacrum Right Stage 2 -  Partial thickness loss of dermis presenting as a shallow open injury with a red, pink wound bed without slough. (Active)  11/19/21 1625  Location: Sacrum  Location Orientation: Right  Staging: Stage 2 -  Partial thickness loss of dermis presenting as a shallow open injury with a red, pink wound bed without slough.  Wound Description (Comments):   Present on Admission: Yes  Dressing Type Foam - Lift dressing to assess site every shift 12/20/21 2144    DVT prophylaxis: Eliquis Family Communication: None Status is: Inpatient Remains inpatient appropriate because: Acute respiratory failure with hypoxia of unclear etiology    Code Status:     Code Status Orders  (From admission, onward)           Start     Ordered   12/16/21 0046  Do not attempt resuscitation (DNR)  Continuous       Question Answer Comment  In the event of cardiac or respiratory ARREST Do not call a "code blue"   In the event of cardiac or respiratory ARREST Do not perform Intubation, CPR, defibrillation or ACLS   In the event of cardiac or respiratory ARREST Use medication by any route, position, wound care, and other measures to relive pain and suffering. May use oxygen, suction and manual treatment of airway obstruction as needed for comfort.      12/16/21 0049           Code Status History     Date Active Date Inactive Code Status Order ID Comments User Context   11/19/2021 1532 11/30/2021 1527 Full Code [id number]  Elizabeth Sauer Inpatient   11/19/2021 1532 11/19/2021 1532 DNR [id number]  Cathlyn Parsons, PA-C Inpatient   11/11/2021 1629 11/19/2021 1509 DNR [id number]  Vernelle Emerald, MD  Inpatient   11/04/2021 1707 11/11/2021 1629 Full Code [id number]  Irving Copas, PA-C Inpatient   08/18/2021 2015 08/22/2021 1938 Full Code [id number]  Kayleen Memos, DO ED   10/04/2019 2258 10/12/2019 2204 Full Code [id number]  Stark Klein, MD ED   12/23/2018 1823 12/24/2018 2142 Full Code [id number]  Norman Herrlich Inpatient   08/10/2018 0059 08/13/2018 2201 Full Code [id number]  Etta Quill, DO ED   01/03/2018 1913 01/10/2018 1713 Full Code [id number]  Nani Skillern, PA-C Inpatient   12/24/2017 1101 12/24/2017 1552 Full Code [id number]  Bensimhon, Shaune Pascal, MD Inpatient         IV Access:   Peripheral IV   Procedures and diagnostic studies:   No results found.   Medical Consultants:   None.   Subjective:    Aasim Restivo Jaquith no complaints of morning just woke up.  Objective:    Vitals:   12/22/21 1556 12/22/21 2312 12/23/21 0349 12/23/21 0818  BP: 107/69 110/65 (!) 113/55 106/67  Pulse: 81 83 78 75  Resp: '16 15  16  '$ Temp: 97.6 F (36.4 C) (!) 97.5 F (36.4 C)  (!)  97.3 F (36.3 C)  TempSrc: Oral Oral  Oral  SpO2: 100% 98% 99% 98%  Weight:      Height:       SpO2: 98 % O2 Flow Rate (L/min): 2 L/min   Intake/Output Summary (Last 24 hours) at 12/23/2021 0844 Last data filed at 12/23/2021 0529 Gross per 24 hour  Intake --  Output 950 ml  Net -950 ml    Filed Weights   12/16/21 1753  Weight: 73.6 kg    Exam: General exam: In no acute distress. Respiratory system: Good air movement and clear to auscultation. Cardiovascular system: S1 & S2 heard, RRR. No JVD. Gastrointestinal system: Abdomen is nondistended, soft and nontender.  Extremities: No pedal edema. Skin: No rashes, lesions or ulcers Psychiatry: Judgement and insight appear normal. Mood & affect appropriate.  Data Reviewed:    Labs: Basic Metabolic Panel: Recent Labs  Lab 12/18/21 0853 12/19/21 0337 12/20/21 0154 12/21/21 0131 12/22/21 0538  NA 136 132* 136 135 130*   K 3.4* 4.3 4.1 3.7 3.8  CL 101 102 99 98 96*  CO2 '25 25 28 27 24  '$ GLUCOSE 110* 99 98 111* 98  BUN '15 17 18 '$ 24* 31*  CREATININE 0.87 0.86 0.88 1.00 1.07  CALCIUM 8.1* 8.0* 8.4* 8.4* 7.9*  MG 1.6*  --   --   --   --     GFR Estimated Creatinine Clearance: 62.6 mL/min (by C-G formula based on SCr of 1.07 mg/dL). Liver Function Tests: No results for input(s): "AST", "ALT", "ALKPHOS", "BILITOT", "PROT", "ALBUMIN" in the last 168 hours.  No results for input(s): "LIPASE", "AMYLASE" in the last 168 hours. No results for input(s): "AMMONIA" in the last 168 hours.  Coagulation profile No results for input(s): "INR", "PROTIME" in the last 168 hours. COVID-19 Labs  No results for input(s): "DDIMER", "FERRITIN", "LDH", "CRP" in the last 72 hours.  Lab Results  Component Value Date   SARSCOV2NAA NEGATIVE 10/12/2019   SARSCOV2NAA NEGATIVE 10/04/2019   SARSCOV2NAA NOT DETECTED 12/20/2018   [place] NEGATIVE 08/10/2018    CBC: Recent Labs  Lab 12/17/21 1052 12/18/21 0522  WBC 31.0* 37.3*  NEUTROABS 26.7* 32.9*  HGB 10.4* 10.8*  HCT 30.5* 32.2*  MCV 116.9* 119.3*  PLT 293 334    Cardiac Enzymes: No results for input(s): "CKTOTAL", "CKMB", "CKMBINDEX", "TROPONINI" in the last 168 hours.  BNP (last 3 results) No results for input(s): "PROBNP" in the last 8760 hours. CBG: No results for input(s): "GLUCAP" in the last 168 hours. D-Dimer: No results for input(s): "DDIMER" in the last 72 hours. Hgb A1c: No results for input(s): "HGBA1C" in the last 72 hours. Lipid Profile: No results for input(s): "CHOL", "HDL", "LDLCALC", "TRIG", "CHOLHDL", "LDLDIRECT" in the last 72 hours. Thyroid function studies: No results for input(s): "TSH", "T4TOTAL", "T3FREE", "THYROIDAB" in the last 72 hours.  Invalid input(s): "FREET3" Anemia work up: No results for input(s): "VITAMINB12", "FOLATE", "FERRITIN", "TIBC", "IRON", "RETICCTPCT" in the last 72 hours.  Sepsis Labs: Recent Labs   Lab 12/17/21 1052 12/18/21 0522  WBC 31.0* 37.3*    Microbiology Recent Results (from the past 240 hour(s))  Blood culture (routine x 2)     Status: None   Collection Time: 12/15/21  5:25 PM   Specimen: BLOOD  Result Value Ref Range Status   Specimen Description BLOOD LEFT ANTECUBITAL  Final   Special Requests   Final    BOTTLES DRAWN AEROBIC AND ANAEROBIC Blood Culture results may not be optimal due to  an excessive volume of blood received in culture bottles   Culture   Final    NO GROWTH 5 DAYS Performed at [facility], [place] [address] [place], [place] [address]    Report Status 12/20/2021 FINAL  Final  Blood culture (routine x 2)     Status: None   Collection Time: 12/15/21  5:30 PM   Specimen: BLOOD  Result Value Ref Range Status   Specimen Description BLOOD RIGHT ANTECUBITAL  Final   Special Requests   Final    BOTTLES DRAWN AEROBIC AND ANAEROBIC Blood Culture adequate volume   Culture   Final    NO GROWTH 5 DAYS Performed at [facility], [place] [address], [place], [place] [address]    Report Status 12/20/2021 FINAL  Final  Urine Culture     Status: Abnormal   Collection Time: 12/15/21  7:50 PM   Specimen: Urine, Clean Catch  Result Value Ref Range Status   Specimen Description URINE, CLEAN CATCH  Final   Special Requests   Final    NONE Performed at [facility], [place] [address], [place], [place] [address]    Culture >=100,000 COLONIES/mL PROTEUS MIRABILIS (A)  Final   Report Status 12/18/2021 FINAL  Final   Organism ID, Bacteria PROTEUS MIRABILIS (A)  Final      Susceptibility   Proteus mirabilis - MIC*    AMPICILLIN <=2 SENSITIVE Sensitive     CEFAZOLIN <=4 SENSITIVE Sensitive     CEFEPIME <=0.12 SENSITIVE Sensitive     CEFTRIAXONE <=0.25 SENSITIVE Sensitive     CIPROFLOXACIN <=0.25 SENSITIVE Sensitive     GENTAMICIN <=1 SENSITIVE Sensitive     IMIPENEM 2 SENSITIVE Sensitive     NITROFURANTOIN 128 RESISTANT Resistant     TRIMETH/SULFA  <=20 SENSITIVE Sensitive     AMPICILLIN/SULBACTAM <=2 SENSITIVE Sensitive     PIP/TAZO <=4 SENSITIVE Sensitive     * >=100,000 COLONIES/mL PROTEUS MIRABILIS  MRSA culture     Status: None   Collection Time: 12/17/21 11:41 AM   Specimen: Nasal; Body Fluid  Result Value Ref Range Status   Specimen Description NASAL SWAB  Final   Special Requests   Final    NASAL SWAB Performed at [facility], [place] [address] [place], [place] [address]    Culture FEW STAPHYLOCOCCUS AUREUS  Final   Report Status 12/19/2021 FINAL  Final   Organism ID, Bacteria STAPHYLOCOCCUS AUREUS  Final      Susceptibility   Staphylococcus aureus - MIC*    CIPROFLOXACIN <=0.5 SENSITIVE Sensitive     ERYTHROMYCIN <=0.25 SENSITIVE Sensitive     GENTAMICIN <=0.5 SENSITIVE Sensitive     OXACILLIN <=0.25 SENSITIVE Sensitive     TETRACYCLINE <=1 SENSITIVE Sensitive     VANCOMYCIN <=0.5 SENSITIVE Sensitive     TRIMETH/SULFA <=10 SENSITIVE Sensitive     CLINDAMYCIN <=0.25 SENSITIVE Sensitive     RIFAMPIN <=0.5 SENSITIVE Sensitive     Inducible Clindamycin NEGATIVE Sensitive     * FEW STAPHYLOCOCCUS AUREUS  Culture, body fluid w Gram Stain-bottle     Status: None (Preliminary result)   Collection Time: 12/18/21 10:01 AM   Specimen: Pleura  Result Value Ref Range Status   Specimen Description PLEURAL  Final   Special Requests NONE  Final   Culture   Final    NO GROWTH 4 DAYS Performed at [facility], [place] [address], [place], [place] [address]    Report Status  PENDING  Incomplete  Gram stain     Status: None   Collection Time: 12/18/21 10:01 AM   Specimen: Pleura  Result Value Ref Range Status   Specimen Description PLEURAL  Final   Special Requests NONE  Final   Gram Stain   Final    NO WBC SEEN NO ORGANISMS SEEN Performed at [facility], [address] [place], [place] [address]    Report Status 12/18/2021 FINAL  Final     Medications:    (feeding supplement) PROSource Plus  30 mL  Oral BID BM   apixaban  5 mg Oral BID   Chlorhexidine Gluconate Cloth  6 each Topical Daily   cholecalciferol  5,000 Units Oral Daily   ferrous sulfate  325 mg Oral Q breakfast   furosemide  20 mg Oral Daily   hydroxyurea  500 mg Oral BID   leptospermum manuka honey  1 Application Topical Daily   melatonin  3 mg Oral QHS   metoprolol tartrate  25 mg Oral BID   multivitamin with minerals  1 tablet Oral Daily   pantoprazole  40 mg Oral BID   potassium chloride  10 mEq Oral Daily   rosuvastatin  20 mg Oral Daily   sucralfate  1 g Oral BID   tamsulosin  0.4 mg Oral Daily   zolpidem  10 mg Oral QHS   Continuous Infusions:  sodium chloride Stopped (12/19/21 1340)      LOS: 7 days   Charlynne Cousins  [facility]  12/23/2021, 8:44 AM

## 2021-12-23 NOTE — TOC Progression Note (Signed)
Transition of Care [facility]) - Progression Note    Patient Details  Name: Justin [name] MRN: [id number] Date of Birth: 10/30/1955  Transition of Care [facility]) CM/SW [place], [place] Phone Number: 12/23/2021, 2:01 PM  Clinical Narrative:    Pt's authorization has been approved and has a bed when medically ready. CSW notified medical team. TOC will continue to follow for DC needs.   Expected Discharge Plan: [place] Barriers to Discharge: Continued Medical Work up  Expected Discharge Plan and Services Expected Discharge Plan: [place] In-house Referral: Clinical Social Work     Living arrangements for the past 2 months: Single Family Home                                       Social Determinants of Health (SDOH) Interventions    Readmission Risk Interventions    11/06/2021    9:35 AM  Readmission Risk Prevention Plan  Transportation Screening Complete  PCP or Specialist Appt within 3-5 Days Complete  HRI or [place] Complete  Social Work Consult for [place] Planning/Counseling Complete  Palliative Care Screening Not Applicable  Medication Review Press photographer) Complete

## 2021-12-24 DIAGNOSIS — J9601 Acute respiratory failure with hypoxia: Secondary | ICD-10-CM | POA: Diagnosis not present

## 2021-12-24 DIAGNOSIS — N3001 Acute cystitis with hematuria: Secondary | ICD-10-CM | POA: Diagnosis not present

## 2021-12-24 DIAGNOSIS — I482 Chronic atrial fibrillation, unspecified: Secondary | ICD-10-CM

## 2021-12-24 DIAGNOSIS — G9341 Metabolic encephalopathy: Secondary | ICD-10-CM | POA: Diagnosis not present

## 2021-12-24 DIAGNOSIS — N138 Other obstructive and reflux uropathy: Secondary | ICD-10-CM

## 2021-12-24 DIAGNOSIS — N401 Enlarged prostate with lower urinary tract symptoms: Secondary | ICD-10-CM

## 2021-12-24 DIAGNOSIS — S31809A Unspecified open wound of unspecified buttock, initial encounter: Secondary | ICD-10-CM | POA: Diagnosis not present

## 2021-12-24 MED ORDER — OXYCODONE HCL 5 MG PO TABS: 5.0000 mg | ORAL_TABLET | ORAL | 0 refills | Status: AC | PRN

## 2021-12-24 NOTE — Care Management Important Message (Signed)
Important Message  Patient Details  Name: Justin [name] MRN: [id number] Date of Birth: 1955/01/30   Medicare Important Message Given:  Yes     Hannah Beat 12/24/2021, 11:43 AM

## 2021-12-24 NOTE — Plan of Care (Signed)
Patient ID: Justin [name], male   DOB: July 13, 1955, 66 y.o.   MRN: [id number]  Problem: Acute Rehab PT Goals(only PT should resolve) Goal: Pt Will Go Supine/Side To Sit Outcome: Adequate for Discharge Goal: Patient Will Transfer Sit To/From Stand Outcome: Adequate for Discharge Goal: Pt Will Transfer Bed To Chair/Chair To Bed Outcome: Adequate for Discharge Goal: Pt Will Ambulate Outcome: Adequate for Discharge Goal: Pt Will Go Up/Down Stairs Outcome: Adequate for Discharge Goal: Pt/caregiver will Perform Home Exercise Program Outcome: Adequate for Discharge   Problem: Acute Rehab OT Goals (only OT should resolve) Goal: Pt. Will Perform Lower Body Bathing Outcome: Adequate for Discharge Goal: Pt. Will Perform Lower Body Dressing Outcome: Adequate for Discharge Goal: Pt. Will Transfer To Toilet Outcome: Adequate for Discharge Goal: Pt. Will Perform Toileting-Clothing Manipulation Outcome: Adequate for Discharge   Problem: Health Behavior/Discharge Planning: Goal: Ability to manage health-related needs will improve Outcome: Adequate for Discharge   Problem: Clinical Measurements: Goal: Ability to maintain clinical measurements within normal limits will improve Outcome: Adequate for Discharge Goal: Will remain free from infection Outcome: Adequate for Discharge Goal: Diagnostic test results will improve Outcome: Adequate for Discharge Goal: Respiratory complications will improve Outcome: Adequate for Discharge Goal: Cardiovascular complication will be avoided Outcome: Adequate for Discharge   Problem: Activity: Goal: Risk for activity intolerance will decrease Outcome: Adequate for Discharge   Problem: Nutrition: Goal: Adequate nutrition will be maintained Outcome: Adequate for Discharge   Problem: Coping: Goal: Level of anxiety will decrease Outcome: Adequate for Discharge   Problem: Elimination: Goal: Will not experience complications related to bowel  motility Outcome: Adequate for Discharge Goal: Will not experience complications related to urinary retention Outcome: Adequate for Discharge   Problem: Pain Managment: Goal: General experience of comfort will improve Outcome: Adequate for Discharge   Problem: Safety: Goal: Ability to remain free from injury will improve Outcome: Adequate for Discharge   Problem: Skin Integrity: Goal: Risk for impaired skin integrity will decrease Outcome: Adequate for Discharge   Problem: Malnutrition  (NI-5.2) Goal: Food and/or nutrient delivery Description: Individualized approach for food/nutrient provision. Outcome: Adequate for Discharge  Haydee Salter, RN

## 2021-12-24 NOTE — Progress Notes (Signed)
Patient ID: Justin [name], male   DOB: Oct 26, 1955, 66 y.o.   MRN: [id number] Attempted to call report to [facility] x 2, but phone went straight to voicemail. Reached out to Education officer, museum who called their Admission Office and was instructed to call back. Upon call back, nurse was busy and the MUR took my name and number and stated she would call me back.  Haydee Salter, RN

## 2021-12-24 NOTE — Discharge Summary (Addendum)
Physician Discharge Summary  Justin [name] VQM:[id number] DOB: 1955/12/03 DOA: 12/15/2021  PCP: [name], MD  Admit date: 12/15/2021 Discharge date: 12/24/2021  Admitted From: Home Disposition:  SNF  Recommendations for Outpatient Follow-up:  Follow up with PCP in 1-2 weeks, will need a repeat CT scan of the chest will need to be repeated in 3 to 6 months to follow-up on lymphadenopathy Please obtain BMP/CBC in one week   Home Health:No Equipment/Devices:None  Discharge Condition:Stable CODE STATUS:Full Diet recommendation: Heart Healthy    Brief/Interim Summary:  66 y.o. male past medical history of essential thrombocythemia, chronic atrial fibrillation on Eliquis, systolic heart failure with an EF of 45% dural venous thrombosis, aortic stenosis with a history of aortic valve replacement, CAD status post CABG, essential hypertension, diabetes mellitus type 2 chronic systolic heart failure BPH with a left total hip replacement on November 04, 2021 comes in to the ED for generalized weakness confusion for 1 week, he was also noted to have a pressure ulcer on the left buttock and heel, white count of 29,000 CT of the head showed no acute findings.   Discharge Diagnoses:  Principal Problem:   Acute hypoxemic respiratory failure (HCC) Active Problems:   Dural venous sinus thrombosis   Acute metabolic encephalopathy   Chronic systolic CHF (congestive heart failure) (HCC)   BPH with obstruction/lower urinary tract symptoms   Chronic anemia   Atrial fibrillation, chronic (HCC)   Essential thrombocythemia (HCC)   Essential hypertension   PAD (peripheral artery disease) (HCC)   Mixed hyperlipidemia   Physical deconditioning   UTI (urinary tract infection)   Stage II pressure ulcer (HCC)  Acute respiratory failure with hypoxia possibly due to community-acquired pneumonia and possibly fluid overload: Started on IV Lasix and he diuresed well.  Negative about 4 L. We were  able to wean him to room air. He was also treated empirically for community-acquired pneumonia for which she completed his treatment in house. Physical therapy evaluated the patient recommended home health PT.  UTI due to indwelling Foley catheter: Urine culture grew Proteus he was continue Flomax he completed course of Keflex in house.  Leukocytosis: Possibly due to leukemia he relates he is being followed by hematologist at [facility] as an outpatient. Could not find records in [place]. CT of the chest showed possible pneumonia with a 6 mm nodule will need a repeat CT scan in 3 to 6 months CT abdomen and pelvis was done that showed no lymphadenopathy mild ascites and splenomegaly.  Acute metabolic encephalopathy: Of unclear etiology probably due to infectious etiology.  Acute on chronic systolic heart failure: Continue metoprolol we will continue Lasix as needed as an outpatient.  Chronic atrial fibrillation: Continue metoprolol and Eliquis no changes made to his medication.  Cranial sinus trauma doses: CT of the head is unchanged continue Eliquis.  Essential thrombocythemia: Continue Hydrea.  Macrocytic anemia: Folate and B12 are unremarkable he will need to follow-up with hematology as an outpatient.  Severe protein caloric malnutrition: Continue Ensure 3 times daily.  Stage II sacral decubitus ulcer and left heel present on admission: Continue wound care instructions  Discharge Instructions  Discharge Instructions     Diet - low sodium heart healthy   Complete by: As directed    Discharge wound care:   Complete by: As directed    Per wound care instructions.   Increase activity slowly   Complete by: As directed       Allergies as of 12/24/2021  Reactions   Codeine Swelling, Other (See Comments)   Tolerates hydrocodone, tramadol, and oxycodone    Penicillins Anaphylaxis   Childhood allergy Tolerated Keflex Dec 2023   Dilaudid [hydromorphone]  Anxiety   Hallucinations    Doxycycline Itching, Rash        Medication List     STOP taking these medications    prenatal multivitamin Tabs tablet   zolpidem 10 MG tablet Commonly known as: AMBIEN       TAKE these medications    albuterol 108 (90 Base) MCG/ACT inhaler Commonly known as: VENTOLIN HFA Inhale 1 puff into the lungs every 6 (six) hours as needed for shortness of breath.   docusate sodium 100 MG capsule Commonly known as: COLACE Take 100 mg by mouth daily as needed for mild constipation.   Eliquis 5 MG Tabs tablet Generic drug: apixaban Take 1 tablet (5 mg total) by mouth 2 (two) times daily.   FeroSul 325 (65 FE) MG tablet Generic drug: ferrous sulfate Take 1 tablet (325 mg total) by mouth 2 (two) times daily with a meal.   furosemide 20 MG tablet Commonly known as: LASIX Take 0.5 tablets (10 mg total) by mouth daily. What changed:  how much to take when to take this reasons to take this   hydroxyurea 500 MG capsule Commonly known as: HYDREA TAKE ONE CAPSULE BY MOUTH TWICE A DAY *MAY TAKE WITH FOOD TO MINIMIZE GI SIDE EFFECTS What changed:  how much to take how to take this when to take this additional instructions   loratadine 10 MG tablet Commonly known as: CLARITIN Take 1 tablet (10 mg total) by mouth daily. What changed:  when to take this reasons to take this   magnesium oxide 400 MG tablet Commonly known as: MAG-OX Take 1 tablet (400 mg total) by mouth daily.   methocarbamol 500 MG tablet Commonly known as: ROBAXIN Take 1 tablet (500 mg total) by mouth every 6 (six) hours as needed for muscle spasms.   metoprolol tartrate 25 MG tablet Commonly known as: LOPRESSOR Take 1 tablet (25 mg total) by mouth 2 (two) times daily.   nitroGLYCERIN 0.4 MG SL tablet Commonly known as: NITROSTAT Place 1 tablet (0.4 mg total) under the tongue every 5 (five) minutes as needed for chest pain.   oxyCODONE 5 MG immediate release  tablet Commonly known as: Oxy IR/ROXICODONE Take 1 tablet (5 mg total) by mouth every 4 (four) hours as needed for moderate pain.   oxymetazoline 0.05 % nasal spray Commonly known as: AFRIN Place 1 spray into both nostrils daily as needed for congestion.   pantoprazole 40 MG tablet Commonly known as: PROTONIX Take 1 tablet (40 mg total) by mouth daily. What changed: when to take this   polyethylene glycol 17 g packet Commonly known as: MIRALAX / GLYCOLAX Take 17 g by mouth daily.   potassium chloride 10 MEQ tablet Commonly known as: KLOR-CON M Take 1 tablet (10 mEq total) by mouth daily. What changed:  when to take this reasons to take this   REFRESH TEARS OP Place 1-2 drops into both eyes daily as needed (dry eyes).   rosuvastatin 20 MG tablet Commonly known as: CRESTOR Take 1 tablet (20 mg total) by mouth daily.   SM Vitamin D3 125 MCG (5000 UT) Tabs Generic drug: Cholecalciferol Take 1 tablet (5,000 Units total) by mouth daily.   sucralfate 1 g tablet Commonly known as: CARAFATE Take 1 tablet (1 g total) by mouth 2 (two)  times daily.   tadalafil 20 MG tablet Commonly known as: CIALIS Take 20 mg by mouth daily as needed for erectile dysfunction.   tamsulosin 0.4 MG Caps capsule Commonly known as: FLOMAX Take 1 capsule (0.4 mg total) by mouth daily.               Discharge Care Instructions  (From admission, onward)           Start     Ordered   12/24/21 0000  Discharge wound care:       Comments: Per wound care instructions.   12/24/21 0837            Contact information for after-discharge care     [facility] .   Service: Skilled Nursing Contact information: [address] [place] [address] [phone]                    Allergies  Allergen Reactions   Codeine Swelling and Other (See Comments)    Tolerates hydrocodone, tramadol, and oxycodone    Penicillins Anaphylaxis     Childhood allergy Tolerated Keflex Dec 2023   Dilaudid [Hydromorphone] Anxiety    Hallucinations    Doxycycline Itching and Rash    Consultations: None   Procedures/Studies: CT ABDOMEN PELVIS W CONTRAST  Result Date: 12/18/2021 CLINICAL DATA:  Lymphadenopathy, groin EXAM: CT ABDOMEN AND PELVIS WITH CONTRAST TECHNIQUE: Multidetector CT imaging of the abdomen and pelvis was performed using the standard protocol following bolus administration of intravenous contrast. RADIATION DOSE REDUCTION: This exam was performed according to the departmental dose-optimization program which includes automated exposure control, adjustment of the mA and/or kV according to patient size and/or use of iterative reconstruction technique. CONTRAST:  50m OMNIPAQUE IOHEXOL 350 MG/ML SOLN COMPARISON:  CT 11/11/2021. FINDINGS: Lower chest: Moderate right pleural effusion with adjacent atelectasis, similar to prior. Decreased left pleural effusion with adjacent basilar airspace disease, either residual atelectasis or an infectious/inflammatory process. Cardiomegaly. Prior aortic valve replacement. Coronary artery atherosclerosis with prior CABG. Hepatobiliary: Mild contour nodularity of the liver, unchanged. Vicarious excretion of contrast in the gallbladder. The gallbladder is nondilated with a few intraluminal stones. Pancreas: Unremarkable. No pancreatic ductal dilatation or surrounding inflammatory changes. Spleen: Mild splenomegaly. Adrenals/Urinary Tract: Unchanged right adrenal adenoma. Renal vascular calcifications. No hydronephrosis or definite nephrolithiasis. Subcentimeter renal cysts are too small to characterize. Bladder is decompressed with Foley catheter in place. Stomach/Bowel: Oral contrast was administered. The stomach is within normal limits. There is no evidence of bowel obstruction. Prior appendectomy. Vascular/Lymphatic: Extensive aortoiliac atherosclerosis. No AAA. No pathologically enlarged lymph  nodes in the abdomen or pelvis. Reproductive: Unremarkable. Other: Small to moderate volume abdominopelvic ascites, most of which is in the pelvis and small amount in the perihepatic and perisplenic areas. No bowel containing hernia. Musculoskeletal: Prior bilateral hip arthroplasties with associated streak artifact. No acute osseous abnormality. Vertebral body hemangiomas. No suspicious osseous lesion. Diffuse body wall edema, unchanged. IMPRESSION: No abdominopelvic lymphadenopathy. Decreased left pleural effusion with residual airspace disease in the left lower lobe, could be residual atelectasis or pneumonia. Unchanged moderate right pleural effusion with adjacent atelectasis. Small to moderate volume abdominopelvic ascites, most collecting in the pelvis, unchanged. Mild splenomegaly. Mild contour nodularity liver, may suggest chronic liver disease. Unchanged diffuse body wall edema. Electronically Signed   By: JMaurine SimmeringM.D.   On: 12/18/2021 15:20   IR THORACENTESIS ASP PLEURAL SPACE W/IMG GUIDE  Result Date: 12/18/2021 INDICATION: Pleural  effusion, shortness of breath EXAM: ULTRASOUND GUIDED LEFT THORACENTESIS MEDICATIONS: None. COMPLICATIONS: None immediate. PROCEDURE: An ultrasound guided thoracentesis was thoroughly discussed with the patient and questions answered. The benefits, risks, alternatives and complications were also discussed. The patient understands and wishes to proceed with the procedure. Written consent was obtained. Ultrasound was performed to localize and mark an adequate pocket of fluid in the left chest. The area was then prepped and draped in the normal sterile fashion. 1% Lidocaine was used for local anesthesia. Under ultrasound guidance a 6 Fr Safe-T-Centesis catheter was introduced. Thoracentesis was performed. The catheter was removed and a dressing applied. FINDINGS: A total of approximately 1.7L of pleural fluid was removed. Samples were sent to the laboratory as requested  by the clinical team. IMPRESSION: Successful ultrasound guided left thoracentesis yielding 1.7L of pleural fluid. Electronically Signed   By: Jerilynn Mages.  Shick M.D.   On: 12/18/2021 11:58   DG Chest 1 View  Result Date: 12/18/2021 CLINICAL DATA:  66 year old male status post thoracentesis. EXAM: CHEST  1 VIEW COMPARISON:  Chest x-ray 12/16/2021. FINDINGS: There is cephalization of the pulmonary vasculature and slight indistinctness of the interstitial markings suggestive of mild pulmonary edema. Small bilateral pleural effusions. No definite pneumothorax. Heart size is mildly enlarged. Upper mediastinal contours are within normal limits. Atherosclerotic calcifications in the thoracic aorta. Status post median sternotomy for CABG, aortic valve replacement (a stented bioprosthesis is noted) and left atrial appendage ligation (a ligation clip is noted). IMPRESSION: 1. The appearance of the chest is most suggestive of congestive heart failure, as above. 2. No appreciable pneumothorax following thoracentesis. 3. Aortic atherosclerosis. Electronically Signed   By: Vinnie Langton M.D.   On: 12/18/2021 11:12   CT CHEST WO CONTRAST  Result Date: 12/17/2021 CLINICAL DATA:  Shortness of breath, chronic, unclear etiology. EXAM: CT CHEST WITHOUT CONTRAST TECHNIQUE: Multidetector CT imaging of the chest was performed following the standard protocol without IV contrast. RADIATION DOSE REDUCTION: This exam was performed according to the departmental dose-optimization program which includes automated exposure control, adjustment of the mA and/or kV according to patient size and/or use of iterative reconstruction technique. COMPARISON:  Chest radiography yesterday. Chest radiography 11/12/2021. CT chest 10/04/2019. FINDINGS: Cardiovascular: Previous median sternotomy and CABG. Chronic cardiomegaly. Mediastinum/Nodes: Mild prominence of the hilar and mediastinal lymph nodes, probably reactive to chronic congestive heart failure.  Lungs/Pleura: Moderate bilateral pleural effusions layering dependently with dependent pulmonary atelectasis, particularly pronounced in the lower lobes. Coexistence pneumonia within the lower lobes is not excluded. There is background pulmonary emphysema and scarring, upper lobe predominant. Three or 4 6 mm nodular shadows in the right upper lobe not seen in 2021 could relate to inflammation or atelectasis. However, follow-up suggested when/if congestive heart failure is resolved. Upper Abdomen: No significant upper abdominal finding. Musculoskeletal: Chronic thoracic degenerative changes. IMPRESSION: 1. Moderate bilateral pleural effusions layering dependently with dependent pulmonary atelectasis, particularly pronounced in the lower lobes. Coexistence pneumonia within the lower lobes is not excluded. Findings consistent with chronic congestive heart failure. 2. Background pulmonary emphysema and scarring, upper lobe predominant. 3. Mild prominence of the hilar and mediastinal lymph nodes, probably reactive to chronic congestive heart failure. 4. Three or four 6 mm nodular shadows in the right upper lobe not seen in 2021 could relate to inflammation or atelectasis. However, follow-up suggested when/if congestive heart failure is resolved. Non-contrast chest CT at 3-6 months would be recommended. If the nodules are stable at time of repeat CT, then future CT at 18-24 months (  from today's scan) is considered optional for low-risk patients, but is recommended for high-risk patients. This recommendation follows the consensus statement: Guidelines for Management of Incidental Pulmonary Nodules Detected on CT Images: From the Fleischner Society 2017; Radiology 2017; 284:228-243. Emphysema (ICD10-J43.9). Aortic Atherosclerosis (ICD10-I70.0). Electronically Signed   By: Nelson Chimes M.D.   On: 12/17/2021 09:26   DG CHEST PORT 1 VIEW  Result Date: 12/16/2021 CLINICAL DATA:  Hypoxia. EXAM: PORTABLE CHEST 1 VIEW  COMPARISON:  November 12, 2021 FINDINGS: Multiple sternal wires are noted. There is stable mild to moderate severity enlargement of the cardiac silhouette. There is moderate to marked severity calcification of the aortic arch. Moderate severity diffusely increased interstitial lung markings are noted. Mild atelectasis is seen within the bilateral lung bases. There is no evidence of a pleural effusion or pneumothorax. The visualized skeletal structures are unremarkable. IMPRESSION: 1. Evidence of prior median sternotomy. 2. Stable cardiomegaly with moderate severity interstitial edema and mild bibasilar atelectasis. Electronically Signed   By: Virgina Norfolk M.D.   On: 12/16/2021 01:07   CT VENOGRAM HEAD  Result Date: 12/15/2021 CLINICAL DATA:  History of dural venous sinus thrombosis. Altered mental status. EXAM: CT VENOGRAM HEAD TECHNIQUE: Venographic phase images of the brain were obtained following the administration of intravenous contrast. Multiplanar reformats and maximum intensity projections were generated. RADIATION DOSE REDUCTION: This exam was performed according to the departmental dose-optimization program which includes automated exposure control, adjustment of the mA and/or kV according to patient size and/or use of iterative reconstruction technique. CONTRAST:  75m OMNIPAQUE IOHEXOL 350 MG/ML SOLN COMPARISON:  11/10/2021 FINDINGS: Superior sagittal sinus: Normal. Straight sinus: Normal. Inferior sagittal sinus, vein of Galen and internal cerebral veins: Normal. Transverse sinuses: Normal. Sigmoid sinuses: Unchanged filling defect within the left sigmoid sinus that extends into the left internal jugular vein. The right sigmoid sinus is normal. Visualized jugular veins: Unchanged left internal jugular vein filling defect. Right side is normal. IMPRESSION: Unchanged venous thrombosis within the left sigmoid sinus that extends into the left internal jugular vein. Electronically Signed   By: KUlyses JarredM.D.   On: 12/15/2021 19:54   CT Head Wo Contrast  Result Date: 12/15/2021 CLINICAL DATA:  Altered mental status EXAM: CT HEAD WITHOUT CONTRAST TECHNIQUE: Contiguous axial images were obtained from the base of the skull through the vertex without intravenous contrast. RADIATION DOSE REDUCTION: This exam was performed according to the departmental dose-optimization program which includes automated exposure control, adjustment of the mA and/or kV according to patient size and/or use of iterative reconstruction technique. COMPARISON:  11/10/2021 FINDINGS: Brain: There is no mass, hemorrhage or extra-axial collection. The size and configuration of the ventricles and extra-axial CSF spaces are normal. There is hypoattenuation of the white matter, most commonly indicating chronic small vessel disease. Vascular: Atherosclerotic calcification of the internal carotid arteries at the skull base. No abnormal hyperdensity of the major intracranial arteries or dural venous sinuses. Skull: The visualized skull base, calvarium and extracranial soft tissues are normal. Sinuses/Orbits: No fluid levels or advanced mucosal thickening of the visualized paranasal sinuses. No mastoid or middle ear effusion. The orbits are normal. IMPRESSION: 1. No acute intracranial abnormality. 2. Chronic small vessel disease. Electronically Signed   By: KUlyses JarredM.D.   On: 12/15/2021 19:50     Subjective: No complaints  Discharge Exam: Vitals:   12/24/21 0524 12/24/21 0753  BP: 117/68 (!) 121/58  Pulse: 83 60  Resp: 20 16  Temp: 98 F (36.7 C) 97.8  F (36.6 C)  SpO2: 95% 90%   Vitals:   12/23/21 1608 12/23/21 2048 12/24/21 0524 12/24/21 0753  BP: 92/62 117/73 117/68 (!) 121/58  Pulse: 64 74 83 60  Resp: '15 18 20 16  '$ Temp: 97.7 F (36.5 C) (!) 97.5 F (36.4 C) 98 F (36.7 C) 97.8 F (36.6 C)  TempSrc: Oral Oral Oral   SpO2: 95% 97% 95% 90%  Weight:      Height:        General: Pt is alert, awake, not in  acute distress Cardiovascular: RRR, S1/S2 +, no rubs, no gallops Respiratory: CTA bilaterally, no wheezing, no rhonchi Abdominal: Soft, NT, ND, bowel sounds + Extremities: no edema, no cyanosis    The results of significant diagnostics from this hospitalization (including imaging, microbiology, ancillary and laboratory) are listed below for reference.     Microbiology: Recent Results (from the past 240 hour(s))  Blood culture (routine x 2)     Status: None   Collection Time: 12/15/21  5:25 PM   Specimen: BLOOD  Result Value Ref Range Status   Specimen Description BLOOD LEFT ANTECUBITAL  Final   Special Requests   Final    BOTTLES DRAWN AEROBIC AND ANAEROBIC Blood Culture results may not be optimal due to an excessive volume of blood received in culture bottles   Culture   Final    NO GROWTH 5 DAYS Performed at [facility], [place] [address], [place], [place] [address]    Report Status 12/20/2021 FINAL  Final  Blood culture (routine x 2)     Status: None   Collection Time: 12/15/21  5:30 PM   Specimen: BLOOD  Result Value Ref Range Status   Specimen Description BLOOD RIGHT ANTECUBITAL  Final   Special Requests   Final    BOTTLES DRAWN AEROBIC AND ANAEROBIC Blood Culture adequate volume   Culture   Final    NO GROWTH 5 DAYS Performed at [facility], [place] [address] [place], [place] [address]    Report Status 12/20/2021 FINAL  Final  Urine Culture     Status: Abnormal   Collection Time: 12/15/21  7:50 PM   Specimen: Urine, Clean Catch  Result Value Ref Range Status   Specimen Description URINE, CLEAN CATCH  Final   Special Requests   Final    NONE Performed at [facility], [place] [address], [place], [place] [address]    Culture >=100,000 COLONIES/mL PROTEUS MIRABILIS (A)  Final   Report Status 12/18/2021 FINAL  Final   Organism ID, Bacteria PROTEUS MIRABILIS (A)  Final      Susceptibility   Proteus mirabilis - MIC*    AMPICILLIN <=2 SENSITIVE Sensitive      CEFAZOLIN <=4 SENSITIVE Sensitive     CEFEPIME <=0.12 SENSITIVE Sensitive     CEFTRIAXONE <=0.25 SENSITIVE Sensitive     CIPROFLOXACIN <=0.25 SENSITIVE Sensitive     GENTAMICIN <=1 SENSITIVE Sensitive     IMIPENEM 2 SENSITIVE Sensitive     NITROFURANTOIN 128 RESISTANT Resistant     TRIMETH/SULFA <=20 SENSITIVE Sensitive     AMPICILLIN/SULBACTAM <=2 SENSITIVE Sensitive     PIP/TAZO <=4 SENSITIVE Sensitive     * >=100,000 COLONIES/mL PROTEUS MIRABILIS  MRSA culture     Status: None   Collection Time: 12/17/21 11:41 AM   Specimen: Nasal; Body Fluid  Result Value Ref Range Status   Specimen Description NASAL SWAB  Final   Special Requests   Final  NASAL SWAB Performed at [facility], [place] [address] [place], [place] [address]    Culture FEW STAPHYLOCOCCUS AUREUS  Final   Report Status 12/19/2021 FINAL  Final   Organism ID, Bacteria STAPHYLOCOCCUS AUREUS  Final      Susceptibility   Staphylococcus aureus - MIC*    CIPROFLOXACIN <=0.5 SENSITIVE Sensitive     ERYTHROMYCIN <=0.25 SENSITIVE Sensitive     GENTAMICIN <=0.5 SENSITIVE Sensitive     OXACILLIN <=0.25 SENSITIVE Sensitive     TETRACYCLINE <=1 SENSITIVE Sensitive     VANCOMYCIN <=0.5 SENSITIVE Sensitive     TRIMETH/SULFA <=10 SENSITIVE Sensitive     CLINDAMYCIN <=0.25 SENSITIVE Sensitive     RIFAMPIN <=0.5 SENSITIVE Sensitive     Inducible Clindamycin NEGATIVE Sensitive     * FEW STAPHYLOCOCCUS AUREUS  Culture, body fluid w Gram Stain-bottle     Status: None   Collection Time: 12/18/21 10:01 AM   Specimen: Pleura  Result Value Ref Range Status   Specimen Description PLEURAL  Final   Special Requests NONE  Final   Culture   Final    NO GROWTH 5 DAYS Performed at [facility], [place] [address] [place], [place] [address]    Report Status 12/23/2021 FINAL  Final  Gram stain     Status: None   Collection Time: 12/18/21 10:01 AM   Specimen: Pleura  Result Value Ref Range Status   Specimen Description  PLEURAL  Final   Special Requests NONE  Final   Gram Stain   Final    NO WBC SEEN NO ORGANISMS SEEN Performed at [facility], [place] [address], [place], [place] [address]    Report Status 12/18/2021 FINAL  Final     Labs: BNP (last 3 results) Recent Labs    11/09/21 0347 11/10/21 0346 12/16/21 0330  BNP 1,384.2* 1,137.0* 3,825.0*   Basic Metabolic Panel: Recent Labs  Lab 12/18/21 0853 12/19/21 0337 12/20/21 0154 12/21/21 0131 12/22/21 0538  NA 136 132* 136 135 130*  K 3.4* 4.3 4.1 3.7 3.8  CL 101 102 99 98 96*  CO2 '25 25 28 27 24  '$ GLUCOSE 110* 99 98 111* 98  BUN '15 17 18 '$ 24* 31*  CREATININE 0.87 0.86 0.88 1.00 1.07  CALCIUM 8.1* 8.0* 8.4* 8.4* 7.9*  MG 1.6*  --   --   --   --    Liver Function Tests: No results for input(s): "AST", "ALT", "ALKPHOS", "BILITOT", "PROT", "ALBUMIN" in the last 168 hours. No results for input(s): "LIPASE", "AMYLASE" in the last 168 hours. No results for input(s): "AMMONIA" in the last 168 hours. CBC: Recent Labs  Lab 12/18/21 0522  WBC 37.3*  NEUTROABS 32.9*  HGB 10.8*  HCT 32.2*  MCV 119.3*  PLT 334   Cardiac Enzymes: No results for input(s): "CKTOTAL", "CKMB", "CKMBINDEX", "TROPONINI" in the last 168 hours. BNP: Invalid input(s): "POCBNP" CBG: No results for input(s): "GLUCAP" in the last 168 hours. D-Dimer No results for input(s): "DDIMER" in the last 72 hours. Hgb A1c No results for input(s): "HGBA1C" in the last 72 hours. Lipid Profile No results for input(s): "CHOL", "HDL", "LDLCALC", "TRIG", "CHOLHDL", "LDLDIRECT" in the last 72 hours. Thyroid function studies No results for input(s): "TSH", "T4TOTAL", "T3FREE", "THYROIDAB" in the last 72 hours.  Invalid input(s): "FREET3" Anemia work up No results for input(s): "VITAMINB12", "FOLATE", "FERRITIN", "TIBC", "IRON", "RETICCTPCT" in the last 72 hours. Urinalysis    Component Value Date/Time   COLORURINE [place] (  A) 12/15/2021 1950   APPEARANCEUR TURBID (A)  12/15/2021 1950   LABSPEC 1.010 12/15/2021 1950   PHURINE 5.5 12/15/2021 1950   GLUCOSEU NEGATIVE 12/15/2021 1950   HGBUR LARGE (A) 12/15/2021 1950   BILIRUBINUR SMALL (A) 12/15/2021 [place] NEGATIVE 12/15/2021 1950   PROTEINUR 100 (A) 12/15/2021 1950   NITRITE NEGATIVE 12/15/2021 1950   LEUKOCYTESUR MODERATE (A) 12/15/2021 1950   Sepsis Labs Recent Labs  Lab 12/18/21 0522  WBC 37.3*   Microbiology Recent Results (from the past 240 hour(s))  Blood culture (routine x 2)     Status: None   Collection Time: 12/15/21  5:25 PM   Specimen: BLOOD  Result Value Ref Range Status   Specimen Description BLOOD LEFT ANTECUBITAL  Final   Special Requests   Final    BOTTLES DRAWN AEROBIC AND ANAEROBIC Blood Culture results may not be optimal due to an excessive volume of blood received in culture bottles   Culture   Final    NO GROWTH 5 DAYS Performed at [facility], [place] [address] [place], [place] [address]    Report Status 12/20/2021 FINAL  Final  Blood culture (routine x 2)     Status: None   Collection Time: 12/15/21  5:30 PM   Specimen: BLOOD  Result Value Ref Range Status   Specimen Description BLOOD RIGHT ANTECUBITAL  Final   Special Requests   Final    BOTTLES DRAWN AEROBIC AND ANAEROBIC Blood Culture adequate volume   Culture   Final    NO GROWTH 5 DAYS Performed at [facility], [place] [address], [place], [place] [address]    Report Status 12/20/2021 FINAL  Final  Urine Culture     Status: Abnormal   Collection Time: 12/15/21  7:50 PM   Specimen: Urine, Clean Catch  Result Value Ref Range Status   Specimen Description URINE, CLEAN CATCH  Final   Special Requests   Final    NONE Performed at [facility], [place] [address], [place], [place] [address]    Culture >=100,000 COLONIES/mL PROTEUS MIRABILIS (A)  Final   Report Status 12/18/2021 FINAL  Final   Organism ID, Bacteria PROTEUS MIRABILIS (A)  Final      Susceptibility   Proteus mirabilis -  MIC*    AMPICILLIN <=2 SENSITIVE Sensitive     CEFAZOLIN <=4 SENSITIVE Sensitive     CEFEPIME <=0.12 SENSITIVE Sensitive     CEFTRIAXONE <=0.25 SENSITIVE Sensitive     CIPROFLOXACIN <=0.25 SENSITIVE Sensitive     GENTAMICIN <=1 SENSITIVE Sensitive     IMIPENEM 2 SENSITIVE Sensitive     NITROFURANTOIN 128 RESISTANT Resistant     TRIMETH/SULFA <=20 SENSITIVE Sensitive     AMPICILLIN/SULBACTAM <=2 SENSITIVE Sensitive     PIP/TAZO <=4 SENSITIVE Sensitive     * >=100,000 COLONIES/mL PROTEUS MIRABILIS  MRSA culture     Status: None   Collection Time: 12/17/21 11:41 AM   Specimen: Nasal; Body Fluid  Result Value Ref Range Status   Specimen Description NASAL SWAB  Final   Special Requests   Final    NASAL SWAB Performed at [facility], [place] [address], [place], [place] [address]    Culture FEW STAPHYLOCOCCUS AUREUS  Final   Report Status 12/19/2021 FINAL  Final   Organism ID, Bacteria STAPHYLOCOCCUS AUREUS  Final      Susceptibility   Staphylococcus aureus - MIC*    CIPROFLOXACIN <=0.5 SENSITIVE Sensitive  ERYTHROMYCIN <=0.25 SENSITIVE Sensitive     GENTAMICIN <=0.5 SENSITIVE Sensitive     OXACILLIN <=0.25 SENSITIVE Sensitive     TETRACYCLINE <=1 SENSITIVE Sensitive     VANCOMYCIN <=0.5 SENSITIVE Sensitive     TRIMETH/SULFA <=10 SENSITIVE Sensitive     CLINDAMYCIN <=0.25 SENSITIVE Sensitive     RIFAMPIN <=0.5 SENSITIVE Sensitive     Inducible Clindamycin NEGATIVE Sensitive     * FEW STAPHYLOCOCCUS AUREUS  Culture, body fluid w Gram Stain-bottle     Status: None   Collection Time: 12/18/21 10:01 AM   Specimen: Pleura  Result Value Ref Range Status   Specimen Description PLEURAL  Final   Special Requests NONE  Final   Culture   Final    NO GROWTH 5 DAYS Performed at [place] [address] [place], [place] [address]    Report Status 12/23/2021 FINAL  Final  Gram stain     Status: None   Collection Time: 12/18/21 10:01 AM   Specimen: Pleura  Result Value  Ref Range Status   Specimen Description PLEURAL  Final   Special Requests NONE  Final   Gram Stain   Final    NO WBC SEEN NO ORGANISMS SEEN Performed at [facility], [place] [address], [place], [place] [address]    Report Status 12/18/2021 FINAL  Final    SIGNED:   Charlynne Cousins, MD  [facility] 12/24/2021, 1:15 PM Pager   If 7PM-7AM, please contact night-coverage [email or web address] Password [email or web address]

## 2021-12-24 NOTE — TOC Transition Note (Signed)
Transition of Care [facility]) - CM/SW Discharge Note   Patient Details  Name: Justin [name] MRN: [id number] Date of Birth: 1955-05-12  [facility] CM/SW Contact:  Coralee Pesa, [place] Phone Number: 12/24/2021, 9:56 AM   Clinical Narrative:    Pt to be transported to [facility] via [place]. Nurse to call report to [phone].   Final next level of care: Skilled Nursing Facility Barriers to Discharge: Barriers Resolved   Patient Goals and CMS Choice Patient states their goals for this hospitalization and ongoing recovery are:: SNF CMS Medicare.gov Compare Post Acute Care list provided to:: Patient Represenative (must comment) (patients spouse [name]) Choice offered to / list presented to : Spouse  Discharge Placement              Patient chooses bed at:  [facility]) Patient to be transferred to facility by: [place] Name of family member notified: [name] Patient and family notified of of transfer: 12/24/21  Discharge Plan and Services In-house Referral: Clinical Social Work                                   Social Determinants of Health ([place]) Interventions     Readmission Risk Interventions    11/06/2021    9:35 AM  Readmission Risk Prevention Plan  Transportation Screening Complete  PCP or Specialist Appt within 3-5 Days Complete  HRI or [place] Complete  Social Work Consult for [place] Planning/Counseling Complete  Palliative Care Screening Not Applicable  Medication Review [employer]) Complete

## 2022-01-05 ENCOUNTER — Other Ambulatory Visit: Payer: Self-pay

## 2022-01-05 ENCOUNTER — Emergency Department (HOSPITAL_COMMUNITY): Payer: Medicare Other

## 2022-01-05 ENCOUNTER — Emergency Department (HOSPITAL_COMMUNITY)
Admission: EM | Admit: 2022-01-05 | Discharge: 2022-01-05 | Disposition: A | Discharge status: Home / Self Care | Payer: Medicare Other | Attending: Emergency Medicine | Admitting: Emergency Medicine

## 2022-01-05 DIAGNOSIS — W06XXXA Fall from bed, initial encounter: Secondary | ICD-10-CM | POA: Insufficient documentation

## 2022-01-05 DIAGNOSIS — L89159 Pressure ulcer of sacral region, unspecified stage: Secondary | ICD-10-CM | POA: Diagnosis not present

## 2022-01-05 DIAGNOSIS — Z79899 Other long term (current) drug therapy: Secondary | ICD-10-CM | POA: Diagnosis not present

## 2022-01-05 DIAGNOSIS — I10 Essential (primary) hypertension: Secondary | ICD-10-CM | POA: Diagnosis not present

## 2022-01-05 DIAGNOSIS — L89623 Pressure ulcer of left heel, stage 3: Secondary | ICD-10-CM | POA: Insufficient documentation

## 2022-01-05 DIAGNOSIS — S0101XA Laceration without foreign body of scalp, initial encounter: Secondary | ICD-10-CM | POA: Diagnosis not present

## 2022-01-05 DIAGNOSIS — I2510 Atherosclerotic heart disease of native coronary artery without angina pectoris: Secondary | ICD-10-CM | POA: Insufficient documentation

## 2022-01-05 DIAGNOSIS — W19XXXA Unspecified fall, initial encounter: Secondary | ICD-10-CM

## 2022-01-05 DIAGNOSIS — R519 Headache, unspecified: Secondary | ICD-10-CM | POA: Diagnosis present

## 2022-01-05 LAB — CBC WITH DIFFERENTIAL/PLATELET
Abs Immature Granulocytes: 0.19 10*3/uL — ABNORMAL HIGH (ref 0.00–0.07)
Basophils Absolute: 0.3 10*3/uL — ABNORMAL HIGH (ref 0.0–0.1)
Basophils Relative: 2 %
Eosinophils Absolute: 0 10*3/uL (ref 0.0–0.5)
Eosinophils Relative: 0 %
HCT: 31 % — ABNORMAL LOW (ref 39.0–52.0)
Hemoglobin: 10.9 g/dL — ABNORMAL LOW (ref 13.0–17.0)
Immature Granulocytes: 2 %
Lymphocytes Relative: 4 %
Lymphs Abs: 0.5 10*3/uL — ABNORMAL LOW (ref 0.7–4.0)
MCH: 40.2 pg — ABNORMAL HIGH (ref 26.0–34.0)
MCHC: 35.2 g/dL (ref 30.0–36.0)
MCV: 114.4 fL — ABNORMAL HIGH (ref 80.0–100.0)
Monocytes Absolute: 0.7 10*3/uL (ref 0.1–1.0)
Monocytes Relative: 5 %
Neutro Abs: 11.3 10*3/uL — ABNORMAL HIGH (ref 1.7–7.7)
Neutrophils Relative %: 87 %
Platelets: 136 10*3/uL — ABNORMAL LOW (ref 150–400)
RBC: 2.71 MIL/uL — ABNORMAL LOW (ref 4.22–5.81)
RDW: 23.4 % — ABNORMAL HIGH (ref 11.5–15.5)
Smear Review: NORMAL
WBC: 12.9 10*3/uL — ABNORMAL HIGH (ref 4.0–10.5)
nRBC: 0.2 % (ref 0.0–0.2)

## 2022-01-05 LAB — BASIC METABOLIC PANEL
Anion gap: 9 (ref 5–15)
BUN: 16 mg/dL (ref 8–23)
CO2: 21 mmol/L — ABNORMAL LOW (ref 22–32)
Calcium: 8.1 mg/dL — ABNORMAL LOW (ref 8.9–10.3)
Chloride: 99 mmol/L (ref 98–111)
Creatinine, Ser: 0.86 mg/dL (ref 0.61–1.24)
GFR, Estimated: >60 mL/min (ref >60)
Glucose, Bld: 92 mg/dL (ref 70–99)
Potassium: 3.7 mmol/L (ref 3.5–5.1)
Sodium: 129 mmol/L — ABNORMAL LOW (ref 135–145)

## 2022-01-05 LAB — PROTIME-INR
INR: 2.6 — ABNORMAL HIGH (ref 0.8–1.2)
Prothrombin Time: 27.6 seconds — ABNORMAL HIGH (ref 11.4–15.2)

## 2022-01-05 MED ORDER — ACETAMINOPHEN 325 MG PO TABS
650.0000 mg | ORAL_TABLET | Freq: Once | ORAL | Status: AC
  Administered 2022-01-05: 650 mg via ORAL
  Filled 2022-01-05: qty 2

## 2022-01-05 NOTE — ED Triage Notes (Signed)
Pt arrived with EMS from [facility],  He fell OOB x 2 - hit right elbow and right head.  He is on Eliquis and c/o back pain as well.  Pt denies neck pain but EMS applied C-collar.

## 2022-01-05 NOTE — Discharge Instructions (Addendum)
Justin [name] was seen in the ER after his fall. He does not have any broken bones or bleeding in his brain. He should continue to follow up with  his hematologist (patient states it is in [place]) for reevaluation of his CBC. Return to the ER with any new severe symptoms. Please dress the wound on his head daily with antibiotic ointment.

## 2022-01-05 NOTE — ED Provider Notes (Signed)
[facility] EMERGENCY DEPARTMENT Provider Note   CSN: [id number] Arrival date & time: 01/05/22  0138     History  Chief Complaint  Patient presents with   Justin [name] is a 66 y.o. male presenting via EMS from [facility] after he rolled out of his bed x 2 this evening.  Denies neck pain but is in c-collar per EMS.  Endorses chronic back pain but nothing changed from his baseline.  Headache, right knee pain.  Personally reviewed his medical records previous history of CABG for CAD, hypertension, history of carotid endarterectomy, aortic valve replacement, atrial fibrillation, peripheral arterial disease, CHF, history of dural venous sinus thrombosis and known pressure ulcer to the left heel, right buttock.  Patient recently d/c after admission to the hospital with CAP, with hypoxia. Currently at [facility].   HPI     Home Medications Prior to Admission medications   Medication Sig Start Date End Date Taking? Authorizing Provider  albuterol (VENTOLIN HFA) 108 (90 Base) MCG/ACT inhaler Inhale 1 puff into the lungs every 6 (six) hours as needed for shortness of breath. 11/28/21   Angiulli, Lavon Paganini, PA-C  Carboxymethylcellulose Sodium (REFRESH TEARS OP) Place 1-2 drops into both eyes daily as needed (dry eyes).    [provider]  Cholecalciferol 125 MCG (5000 UT) TABS Take 1 tablet (5,000 Units total) by mouth daily. 11/28/21   Angiulli, Lavon Paganini, PA-C  docusate sodium (COLACE) 100 MG capsule Take 100 mg by mouth daily as needed for mild constipation.    [provider]  ELIQUIS 5 MG TABS tablet Take 1 tablet (5 mg total) by mouth 2 (two) times daily. 11/28/21   Angiulli, Lavon Paganini, PA-C  ferrous sulfate 325 (65 FE) MG tablet Take 1 tablet (325 mg total) by mouth 2 (two) times daily with a meal. 11/28/21   Angiulli, Lavon Paganini, PA-C  furosemide (LASIX) 20 MG tablet Take 0.5 tablets (10 mg total) by mouth daily. Patient taking  differently: Take 20 mg by mouth daily as needed for edema. 11/28/21   Angiulli, Lavon Paganini, PA-C  hydroxyurea (HYDREA) 500 MG capsule TAKE ONE CAPSULE BY MOUTH TWICE A DAY *MAY TAKE WITH FOOD TO MINIMIZE GI SIDE EFFECTS Patient taking differently: Take 500 mg by mouth 2 (two) times daily. MAY TAKE WITH FOOD TO MINIMIZE GI SIDE EFFECTS 11/28/21   Angiulli, Lavon Paganini, PA-C  loratadine (CLARITIN) 10 MG tablet Take 1 tablet (10 mg total) by mouth daily. Patient taking differently: Take 10 mg by mouth daily as needed for allergies. 11/28/21   Angiulli, Lavon Paganini, PA-C  magnesium oxide (MAG-OX) 400 MG tablet Take 1 tablet (400 mg total) by mouth daily. 11/28/21   Angiulli, Lavon Paganini, PA-C  methocarbamol (ROBAXIN) 500 MG tablet Take 1 tablet (500 mg total) by mouth every 6 (six) hours as needed for muscle spasms. 11/28/21   Angiulli, Lavon Paganini, PA-C  metoprolol tartrate (LOPRESSOR) 25 MG tablet Take 1 tablet (25 mg total) by mouth 2 (two) times daily. 11/28/21   Angiulli, Lavon Paganini, PA-C  nitroGLYCERIN (NITROSTAT) 0.4 MG SL tablet Place 1 tablet (0.4 mg total) under the tongue every 5 (five) minutes as needed for chest pain. 11/28/21   Angiulli, Lavon Paganini, PA-C  oxyCODONE (OXY IR/ROXICODONE) 5 MG immediate release tablet Take 1 tablet (5 mg total) by mouth every 4 (four) hours as needed for moderate pain. 12/24/21   Charlynne Cousins, MD  oxymetazoline (AFRIN) 0.05 % nasal  spray Place 1 spray into both nostrils daily as needed for congestion.    [provider]  pantoprazole (PROTONIX) 40 MG tablet Take 1 tablet (40 mg total) by mouth daily. Patient taking differently: Take 40 mg by mouth 2 (two) times daily. 11/28/21   Angiulli, Lavon Paganini, PA-C  polyethylene glycol (MIRALAX / GLYCOLAX) 17 g packet Take 17 g by mouth daily. 11/28/21   Angiulli, Lavon Paganini, PA-C  potassium chloride (KLOR-CON M) 10 MEQ tablet Take 1 tablet (10 mEq total) by mouth daily. Patient taking differently: Take 10 mEq by mouth daily  as needed (take when the furosemide is taken). 11/28/21   Angiulli, Lavon Paganini, PA-C  rosuvastatin (CRESTOR) 20 MG tablet Take 1 tablet (20 mg total) by mouth daily. 11/28/21   Angiulli, Lavon Paganini, PA-C  sucralfate (CARAFATE) 1 g tablet Take 1 tablet (1 g total) by mouth 2 (two) times daily. 11/28/21 12/28/21  Angiulli, Lavon Paganini, PA-C  tadalafil (CIALIS) 20 MG tablet Take 20 mg by mouth daily as needed for erectile dysfunction.    [provider]  tamsulosin (FLOMAX) 0.4 MG CAPS capsule Take 1 capsule (0.4 mg total) by mouth daily. 11/28/21   Angiulli, Lavon Paganini, PA-C      Allergies    Codeine, Penicillins, Dilaudid [hydromorphone], and Doxycycline    Review of Systems   Review of Systems  Eyes: Negative.   Musculoskeletal:  Positive for back pain.       R knee pain  Neurological:  Positive for headaches. Negative for light-headedness.    Physical Exam Updated Vital Signs BP 121/67   Pulse 85   Temp 97.7 F (36.5 C) (Oral)   Resp 18   Ht '5\' 7"'$  (1.702 m)   Wt 73.6 kg   SpO2 95%   BMI 25.41 kg/m  Physical Exam Vitals and nursing note reviewed.  Constitutional:      Appearance: He is not ill-appearing or toxic-appearing.  HENT:     Head: Normocephalic and atraumatic.      Nose: Nose normal.     Mouth/Throat:     Mouth: Mucous membranes are moist.     Pharynx: Oropharynx is clear. Uvula midline. No oropharyngeal exudate or posterior oropharyngeal erythema.     Tonsils: No tonsillar exudate.  Eyes:     General: Lids are normal. Vision grossly intact.        Right eye: No discharge.        Left eye: No discharge.     Extraocular Movements: Extraocular movements intact.     Conjunctiva/sclera: Conjunctivae normal.     Pupils: Pupils are equal, round, and reactive to light.  Neck:     Trachea: Trachea and phonation normal.  Cardiovascular:     Rate and Rhythm: Normal rate and regular rhythm.     Pulses: Normal pulses.     Heart sounds: Normal heart sounds. No murmur  heard. Pulmonary:     Effort: Pulmonary effort is normal. No tachypnea, bradypnea, accessory muscle usage, prolonged expiration or respiratory distress.     Breath sounds: Normal breath sounds. No wheezing or rales.  Chest:     Chest wall: No mass, lacerations, deformity, swelling, tenderness, crepitus or edema.  Abdominal:     General: Bowel sounds are normal. There is no distension.     Palpations: Abdomen is soft.     Tenderness: There is no abdominal tenderness. There is no guarding or rebound.  Musculoskeletal:        General: No  deformity.     Right shoulder: Normal.     Left shoulder: Normal.     Right upper arm: Normal.     Left upper arm: Normal.     Right elbow: Normal.     Left elbow: Normal.     Right forearm: Normal.     Left forearm: Normal.     Right wrist: Normal.     Left wrist: Normal.     Right hand: Normal.     Left hand: Normal.       Arms:     Cervical back: Normal, normal range of motion and neck supple. No spasms, tenderness or bony tenderness.     Thoracic back: Normal. No spasms, tenderness or bony tenderness.     Lumbar back: Normal. No spasms, tenderness or bony tenderness.     Right hip: Normal.     Left hip: Normal.     Right upper leg: Normal.     Left upper leg: Normal.     Right knee: Bony tenderness present. No swelling.     Left knee: Normal.     Right lower leg: Normal. No edema.     Left lower leg: Normal. No edema.     Right ankle: Normal.     Right Achilles Tendon: Normal.     Left ankle: Normal.     Left Achilles Tendon: Normal.     Right foot: Normal.     Left foot: Normal.       Feet:     Comments: C-collar in place Known sacral decubitus ulcer  Lymphadenopathy:     Cervical: No cervical adenopathy.  Skin:    General: Skin is warm and dry.     Capillary Refill: Capillary refill takes less than 2 seconds.     Findings: Abrasion present.  Neurological:     General: No focal deficit present.     Mental Status: He is alert  and oriented to person, place, and time. Mental status is at baseline.  Psychiatric:        Mood and Affect: Mood normal.     ED Results / Procedures / Treatments   Labs (all labs ordered are listed, but only abnormal results are displayed) Labs Reviewed  CBC WITH DIFFERENTIAL/PLATELET - Abnormal; Notable for the following components:      Result Value   WBC 12.9 (*)    RBC 2.71 (*)    Hemoglobin 10.9 (*)    HCT 31.0 (*)    MCV 114.4 (*)    MCH 40.2 (*)    RDW 23.4 (*)    Platelets 136 (*)    Neutro Abs 11.3 (*)    Lymphs Abs 0.5 (*)    Basophils Absolute 0.3 (*)    Abs Immature Granulocytes 0.19 (*)    All other components within normal limits  BASIC METABOLIC PANEL - Abnormal; Notable for the following components:   Sodium 129 (*)    CO2 21 (*)    Calcium 8.1 (*)    All other components within normal limits  PROTIME-INR - Abnormal; Notable for the following components:   Prothrombin Time 27.6 (*)    INR 2.6 (*)    All other components within normal limits    EKG None  Radiology DG Elbow Complete Right  Result Date: 01/05/2022 CLINICAL DATA:  Fall EXAM: RIGHT ELBOW - COMPLETE 3+ VIEW COMPARISON:  None Available. FINDINGS: No fracture or dislocation is seen. The joint spaces are preserved. Visualized soft tissues  are within normal limits. No displaced elbow joint fat pads to suggest an elbow joint effusion. IMPRESSION: Negative. Electronically Signed   By: Julian Hy M.D.   On: 01/05/2022 03:07   DG Knee 2 Views Right  Result Date: 01/05/2022 CLINICAL DATA:  fall EXAM: RIGHT KNEE - 1-2 VIEW COMPARISON:  None Available. FINDINGS: No evidence of fracture, dislocation, or joint effusion. Old healed proximal fibular shaft fracture. No evidence of arthropathy or other focal bone abnormality. Soft tissues are unremarkable. Vascular calcifications. IMPRESSION: No acute displaced fracture or dislocation. Electronically Signed   By: Iven Finn M.D.   On: 01/05/2022  02:57   DG Hip Unilat W or Wo Pelvis 2-3 Views Right  Result Date: 01/05/2022 CLINICAL DATA:  fall EXAM: DG HIP (WITH OR WITHOUT PELVIS) 2-3V RIGHT COMPARISON:  None Available. FINDINGS: Total bilateral hip arthroplasty. No radiographic findings suggest surgical hardware complication. No acute displaced fracture or diastasis of the bones of the pelvis. There is no evidence of severe arthropathy or other focal bone abnormality. Vascular calcification. Question right gluteal soft tissue defect. IMPRESSION: 1. Total bilateral hip arthroplasty. No radiographic findings suggest surgical hardware complication. 2.  Negative for acute traumatic injury. 3. Question right gluteal soft tissue defect. Electronically Signed   By: Iven Finn M.D.   On: 01/05/2022 02:56   CT Head Wo Contrast  Result Date: 01/05/2022 CLINICAL DATA:  [place] out of bed EXAM: CT HEAD WITHOUT CONTRAST CT CERVICAL SPINE WITHOUT CONTRAST TECHNIQUE: Multidetector CT imaging of the head and cervical spine was performed following the standard protocol without intravenous contrast. Multiplanar CT image reconstructions of the cervical spine were also generated. RADIATION DOSE REDUCTION: This exam was performed according to the departmental dose-optimization program which includes automated exposure control, adjustment of the mA and/or kV according to patient size and/or use of iterative reconstruction technique. COMPARISON:  CT head 11/08/2021, no prior CT cervical spine FINDINGS: CT HEAD FINDINGS Brain: No evidence of acute infarct, hemorrhage, mass, mass effect, or midline shift. No hydrocephalus or extra-axial fluid collection. Vascular: No hyperdense vessel. Atherosclerotic calcifications in the intracranial carotid and vertebral arteries. Skull: Right frontal scalp hematoma. Negative for fracture or focal lesion. Sinuses/Orbits: Mucosal thickening throughout the paranasal sinuses, with air-fluid levels in the maxillary sinuses. Status post  bilateral lens replacements. Other: The mastoid air cells are well aerated. CT CERVICAL SPINE FINDINGS Alignment: No listhesis. Skull base and vertebrae: No acute fracture. No primary bone lesion or focal pathologic process. Osseous fusion of C4 and C5. Soft tissues and spinal canal: No prevertebral fluid or swelling. No visible canal hematoma. Disc levels:  Disc heights are preserved.  No spinal canal stenosis. Upper chest: Moderate left and large right pleural effusions, likely similar to 12/17/2021. Interlobular septal thickening, possibly pulmonary edema. Other: None. IMPRESSION: 1. No acute intracranial process. Right frontal scalp hematoma. 2. No acute fracture or traumatic malalignment in the cervical spine. 3. Moderate left and large right pleural effusions, likely similar to 12/17/2021. Interlobular septal thickening, possibly pulmonary edema. Electronically Signed   By: Merilyn Baba M.D.   On: 01/05/2022 02:16   CT Cervical Spine Wo Contrast  Result Date: 01/05/2022 CLINICAL DATA:  [place] out of bed EXAM: CT HEAD WITHOUT CONTRAST CT CERVICAL SPINE WITHOUT CONTRAST TECHNIQUE: Multidetector CT imaging of the head and cervical spine was performed following the standard protocol without intravenous contrast. Multiplanar CT image reconstructions of the cervical spine were also generated. RADIATION DOSE REDUCTION: This exam was performed according to the departmental  dose-optimization program which includes automated exposure control, adjustment of the mA and/or kV according to patient size and/or use of iterative reconstruction technique. COMPARISON:  CT head 11/08/2021, no prior CT cervical spine FINDINGS: CT HEAD FINDINGS Brain: No evidence of acute infarct, hemorrhage, mass, mass effect, or midline shift. No hydrocephalus or extra-axial fluid collection. Vascular: No hyperdense vessel. Atherosclerotic calcifications in the intracranial carotid and vertebral arteries. Skull: Right frontal scalp hematoma.  Negative for fracture or focal lesion. Sinuses/Orbits: Mucosal thickening throughout the paranasal sinuses, with air-fluid levels in the maxillary sinuses. Status post bilateral lens replacements. Other: The mastoid air cells are well aerated. CT CERVICAL SPINE FINDINGS Alignment: No listhesis. Skull base and vertebrae: No acute fracture. No primary bone lesion or focal pathologic process. Osseous fusion of C4 and C5. Soft tissues and spinal canal: No prevertebral fluid or swelling. No visible canal hematoma. Disc levels:  Disc heights are preserved.  No spinal canal stenosis. Upper chest: Moderate left and large right pleural effusions, likely similar to 12/17/2021. Interlobular septal thickening, possibly pulmonary edema. Other: None. IMPRESSION: 1. No acute intracranial process. Right frontal scalp hematoma. 2. No acute fracture or traumatic malalignment in the cervical spine. 3. Moderate left and large right pleural effusions, likely similar to 12/17/2021. Interlobular septal thickening, possibly pulmonary edema. Electronically Signed   By: Merilyn Baba M.D.   On: 01/05/2022 02:16    Procedures Procedures    Medications Ordered in ED Medications  acetaminophen (TYLENOL) tablet 650 mg (650 mg Oral Given 01/05/22 0327)    ED Course/ Medical Decision Making/ A&P                           Medical Decision Making 66 year old male who presents as a fall on anticoagulation with eliquis.   Oral temperature 97, vitals otherwise normal.  Cardiopulmonary exam is unremarkable, abdominal exam is benign.  Patient with right frontal scalp hematoma with skin tear, PERRL, EOMI, GCS 15, pain in the right knee right elbow.  Amount and/or Complexity of Data Reviewed Labs: ordered. Radiology: ordered.    Details: CT head negative for acute intracranial abnormality, Ct c-spine negative for acute traumatic injury. Plain film of the right elbow, right knee, and hips and pelvis negative for acute osseous  abnormality. Visualized by this provider.   Risk OTC drugs.   No acute or emergent injury noted on workup today.  Patient is hemodynamically stable,  skin tear to the frontal scalp and right elbow dressed with Xeroform with this provider after irrigation.  Clinical concern for emergent underlying injury that would warrant further ED workup or inpatient management is exceedingly low.  Justin [name] voiced understanding of his medical evaluation and treatment plan. Each of their questions answered to their expressed satisfaction.  Return precautions were given.  Patient is well-appearing, stable, and was discharged in good condition.  This chart was dictated using voice recognition software, Dragon. Despite the best efforts of this provider to proofread and correct errors, errors may still occur which can change documentation meaning.   Final Clinical Impression(s) / ED Diagnoses Final diagnoses:  Fall, initial encounter    Rx / DC Orders ED Discharge Orders     None         [name] 01/05/22 [id number]    Merryl Hacker, MD 01/06/22 [phone]

## 2022-01-28 DIAGNOSIS — S81801A Unspecified open wound, right lower leg, initial encounter: Secondary | ICD-10-CM | POA: Diagnosis not present

## 2022-01-28 DIAGNOSIS — K760 Fatty (change of) liver, not elsewhere classified: Secondary | ICD-10-CM | POA: Diagnosis not present

## 2022-01-28 DIAGNOSIS — M545 Low back pain, unspecified: Secondary | ICD-10-CM | POA: Diagnosis not present

## 2022-01-28 DIAGNOSIS — M1612 Unilateral primary osteoarthritis, left hip: Secondary | ICD-10-CM | POA: Diagnosis not present

## 2022-02-03 DIAGNOSIS — U071 COVID-19: Secondary | ICD-10-CM | POA: Diagnosis not present

## 2022-02-03 DIAGNOSIS — E11621 Type 2 diabetes mellitus with foot ulcer: Secondary | ICD-10-CM | POA: Diagnosis not present

## 2022-02-03 DIAGNOSIS — S91302A Unspecified open wound, left foot, initial encounter: Secondary | ICD-10-CM | POA: Diagnosis not present

## 2022-02-03 DIAGNOSIS — R6521 Severe sepsis with septic shock: Secondary | ICD-10-CM | POA: Diagnosis not present

## 2022-02-03 DIAGNOSIS — N390 Urinary tract infection, site not specified: Secondary | ICD-10-CM | POA: Diagnosis not present

## 2022-02-03 DIAGNOSIS — I5022 Chronic systolic (congestive) heart failure: Secondary | ICD-10-CM | POA: Diagnosis not present

## 2022-02-03 DIAGNOSIS — J9601 Acute respiratory failure with hypoxia: Secondary | ICD-10-CM | POA: Diagnosis not present

## 2022-02-03 DIAGNOSIS — I5020 Unspecified systolic (congestive) heart failure: Secondary | ICD-10-CM | POA: Diagnosis not present

## 2022-02-03 DIAGNOSIS — A4189 Other specified sepsis: Secondary | ICD-10-CM | POA: Diagnosis not present

## 2022-02-03 DIAGNOSIS — J984 Other disorders of lung: Secondary | ICD-10-CM | POA: Diagnosis not present

## 2022-02-03 DIAGNOSIS — I482 Chronic atrial fibrillation, unspecified: Secondary | ICD-10-CM | POA: Diagnosis not present

## 2022-02-03 DIAGNOSIS — S91002A Unspecified open wound, left ankle, initial encounter: Secondary | ICD-10-CM | POA: Diagnosis not present

## 2022-02-03 DIAGNOSIS — L89624 Pressure ulcer of left heel, stage 4: Secondary | ICD-10-CM | POA: Diagnosis not present

## 2022-02-03 DIAGNOSIS — I959 Hypotension, unspecified: Secondary | ICD-10-CM | POA: Diagnosis not present

## 2022-02-03 DIAGNOSIS — I4891 Unspecified atrial fibrillation: Secondary | ICD-10-CM | POA: Diagnosis not present

## 2022-02-03 DIAGNOSIS — L89316 Pressure-induced deep tissue damage of right buttock: Secondary | ICD-10-CM | POA: Diagnosis not present

## 2022-02-03 DIAGNOSIS — Z515 Encounter for palliative care: Secondary | ICD-10-CM | POA: Diagnosis not present

## 2022-02-03 DIAGNOSIS — A419 Sepsis, unspecified organism: Secondary | ICD-10-CM | POA: Diagnosis not present

## 2022-02-03 DIAGNOSIS — L89320 Pressure ulcer of left buttock, unstageable: Secondary | ICD-10-CM | POA: Diagnosis not present

## 2022-02-03 DIAGNOSIS — I10 Essential (primary) hypertension: Secondary | ICD-10-CM | POA: Diagnosis not present

## 2022-02-03 DIAGNOSIS — S81802A Unspecified open wound, left lower leg, initial encounter: Secondary | ICD-10-CM | POA: Diagnosis not present

## 2022-02-03 DIAGNOSIS — B964 Proteus (mirabilis) (morganii) as the cause of diseases classified elsewhere: Secondary | ICD-10-CM | POA: Diagnosis not present

## 2022-02-03 DIAGNOSIS — M79605 Pain in left leg: Secondary | ICD-10-CM | POA: Diagnosis not present

## 2022-02-03 DIAGNOSIS — Z4659 Encounter for fitting and adjustment of other gastrointestinal appliance and device: Secondary | ICD-10-CM | POA: Diagnosis not present

## 2022-02-03 DIAGNOSIS — I739 Peripheral vascular disease, unspecified: Secondary | ICD-10-CM | POA: Diagnosis not present

## 2022-02-03 DIAGNOSIS — J810 Acute pulmonary edema: Secondary | ICD-10-CM | POA: Diagnosis not present

## 2022-02-19 DEATH — deceased

## 2022-06-09 ENCOUNTER — Other Ambulatory Visit (HOSPITAL_COMMUNITY): Payer: Self-pay
